# Patient Record
Sex: Male | Born: 1950
Health system: Southern US, Community
[De-identification: ages and names within clinical notes are randomized; demographics above are authoritative.]

## PROBLEM LIST (undated history)

## (undated) ENCOUNTER — Encounter: Attending: Rheumatology | Primary: Rheumatology

## (undated) ENCOUNTER — Telehealth: Attending: Rheumatology | Primary: Rheumatology

## (undated) ENCOUNTER — Ambulatory Visit: Payer: MEDICARE | Attending: Hematology & Oncology | Primary: Hematology & Oncology

## (undated) ENCOUNTER — Ambulatory Visit

## (undated) ENCOUNTER — Telehealth: Attending: Ambulatory Care | Primary: Ambulatory Care

## (undated) ENCOUNTER — Encounter: Attending: Hematology & Oncology | Primary: Hematology & Oncology

## (undated) ENCOUNTER — Ambulatory Visit: Payer: MEDICARE

## (undated) ENCOUNTER — Telehealth: Attending: Hematology & Oncology | Primary: Hematology & Oncology

## (undated) ENCOUNTER — Telehealth

## (undated) ENCOUNTER — Inpatient Hospital Stay

## (undated) ENCOUNTER — Ambulatory Visit: Payer: MEDICARE | Attending: Rheumatology | Primary: Rheumatology

## (undated) ENCOUNTER — Encounter

## (undated) ENCOUNTER — Encounter: Attending: Ambulatory Care | Primary: Ambulatory Care

## (undated) ENCOUNTER — Telehealth: Attending: Internal Medicine | Primary: Internal Medicine

## (undated) ENCOUNTER — Ambulatory Visit: Attending: Pharmacist | Primary: Pharmacist

## (undated) ENCOUNTER — Ambulatory Visit: Attending: Hematology & Oncology | Primary: Hematology & Oncology

## (undated) DIAGNOSIS — T8859XA Other complications of anesthesia, initial encounter: Secondary | ICD-10-CM

## (undated) DIAGNOSIS — J449 Chronic obstructive pulmonary disease, unspecified: Secondary | ICD-10-CM

## (undated) DIAGNOSIS — R918 Other nonspecific abnormal finding of lung field: Secondary | ICD-10-CM

## (undated) DIAGNOSIS — K3184 Gastroparesis: Secondary | ICD-10-CM

## (undated) DIAGNOSIS — G629 Polyneuropathy, unspecified: Secondary | ICD-10-CM

## (undated) DIAGNOSIS — R6521 Severe sepsis with septic shock: Secondary | ICD-10-CM

## (undated) DIAGNOSIS — B351 Tinea unguium: Secondary | ICD-10-CM

## (undated) DIAGNOSIS — H269 Unspecified cataract: Secondary | ICD-10-CM

## (undated) DIAGNOSIS — I2510 Atherosclerotic heart disease of native coronary artery without angina pectoris: Secondary | ICD-10-CM

## (undated) DIAGNOSIS — I10 Essential (primary) hypertension: Secondary | ICD-10-CM

## (undated) DIAGNOSIS — K224 Dyskinesia of esophagus: Secondary | ICD-10-CM

## (undated) DIAGNOSIS — L409 Psoriasis, unspecified: Secondary | ICD-10-CM

## (undated) DIAGNOSIS — M069 Rheumatoid arthritis, unspecified: Secondary | ICD-10-CM

## (undated) DIAGNOSIS — K449 Diaphragmatic hernia without obstruction or gangrene: Secondary | ICD-10-CM

## (undated) DIAGNOSIS — K219 Gastro-esophageal reflux disease without esophagitis: Secondary | ICD-10-CM

## (undated) DIAGNOSIS — Z9981 Dependence on supplemental oxygen: Secondary | ICD-10-CM

## (undated) DIAGNOSIS — T4145XA Adverse effect of unspecified anesthetic, initial encounter: Secondary | ICD-10-CM

## (undated) DIAGNOSIS — K760 Fatty (change of) liver, not elsewhere classified: Secondary | ICD-10-CM

## (undated) DIAGNOSIS — A419 Sepsis, unspecified organism: Secondary | ICD-10-CM

## (undated) HISTORY — DX: Chronic obstructive pulmonary disease, unspecified: J44.9

## (undated) HISTORY — DX: Polyneuropathy, unspecified: G62.9

## (undated) HISTORY — DX: Rheumatoid arthritis, unspecified: M06.9

## (undated) HISTORY — DX: Gastroparesis: K31.84

## (undated) HISTORY — PX: CHOLECYSTECTOMY: SHX55

## (undated) HISTORY — DX: Diaphragmatic hernia without obstruction or gangrene: K44.9

## (undated) HISTORY — DX: Fatty (change of) liver, not elsewhere classified: K76.0

## (undated) HISTORY — PX: FOOT SURGERY: SHX648

## (undated) HISTORY — DX: Dyskinesia of esophagus: K22.4

## (undated) HISTORY — PX: TOE AMPUTATION: SHX809

## (undated) HISTORY — DX: Psoriasis, unspecified: L40.9

## (undated) HISTORY — DX: Unspecified cataract: H26.9

## (undated) HISTORY — DX: Gastro-esophageal reflux disease without esophagitis: K21.9

## (undated) HISTORY — DX: Atherosclerotic heart disease of native coronary artery without angina pectoris: I25.10

## (undated) HISTORY — DX: Essential (primary) hypertension: I10

## (undated) MED ORDER — TURMERIC (BULK) MISC: Freq: Every day | 0.00000 days

## (undated) MED ORDER — RITUXIMAB IV: INTRAVENOUS | 0.00000 days

## (undated) MED ORDER — OXYCODONE-ACETAMINOPHEN 7.5 MG-325 MG TABLET: 1 | tablet | Freq: Four times a day (QID) | 0 refills | 30 days

---

## 1999-03-19 ENCOUNTER — Encounter: Admission: RE | Admit: 1999-03-19 | Discharge: 1999-03-19 | Payer: Self-pay | Admitting: Internal Medicine

## 2004-04-18 ENCOUNTER — Inpatient Hospital Stay (HOSPITAL_COMMUNITY): Admission: EM | Admit: 2004-04-18 | Discharge: 2004-04-25 | Payer: Self-pay | Admitting: Emergency Medicine

## 2004-04-18 ENCOUNTER — Ambulatory Visit: Payer: Self-pay | Admitting: Internal Medicine

## 2004-04-18 ENCOUNTER — Encounter: Payer: Self-pay | Admitting: Cardiology

## 2004-05-07 ENCOUNTER — Ambulatory Visit: Payer: Self-pay | Admitting: Internal Medicine

## 2004-06-15 ENCOUNTER — Ambulatory Visit: Payer: Self-pay | Admitting: Internal Medicine

## 2004-06-19 ENCOUNTER — Ambulatory Visit: Payer: Self-pay | Admitting: Internal Medicine

## 2004-07-24 ENCOUNTER — Ambulatory Visit: Payer: Self-pay | Admitting: Internal Medicine

## 2004-08-30 ENCOUNTER — Ambulatory Visit: Payer: Self-pay | Admitting: Internal Medicine

## 2004-09-12 ENCOUNTER — Ambulatory Visit: Payer: Self-pay | Admitting: Internal Medicine

## 2004-09-15 ENCOUNTER — Ambulatory Visit (HOSPITAL_COMMUNITY): Admission: RE | Admit: 2004-09-15 | Discharge: 2004-09-15 | Payer: Self-pay | Admitting: Internal Medicine

## 2004-09-18 ENCOUNTER — Ambulatory Visit: Payer: Self-pay | Admitting: Internal Medicine

## 2004-10-30 ENCOUNTER — Ambulatory Visit: Payer: Self-pay | Admitting: Internal Medicine

## 2004-11-01 ENCOUNTER — Ambulatory Visit: Payer: Self-pay | Admitting: Internal Medicine

## 2004-12-12 ENCOUNTER — Ambulatory Visit: Payer: Self-pay | Admitting: Internal Medicine

## 2004-12-17 ENCOUNTER — Ambulatory Visit: Payer: Self-pay | Admitting: Internal Medicine

## 2005-01-18 ENCOUNTER — Ambulatory Visit: Payer: Self-pay | Admitting: Internal Medicine

## 2005-02-01 ENCOUNTER — Emergency Department (HOSPITAL_COMMUNITY): Admission: EM | Admit: 2005-02-01 | Discharge: 2005-02-01 | Payer: Self-pay | Admitting: Family Medicine

## 2005-02-15 ENCOUNTER — Ambulatory Visit: Payer: Self-pay | Admitting: Internal Medicine

## 2005-03-06 ENCOUNTER — Ambulatory Visit: Payer: Self-pay | Admitting: Internal Medicine

## 2005-03-21 ENCOUNTER — Ambulatory Visit: Payer: Self-pay | Admitting: Internal Medicine

## 2005-03-27 ENCOUNTER — Ambulatory Visit: Payer: Self-pay | Admitting: Internal Medicine

## 2005-04-22 ENCOUNTER — Ambulatory Visit: Payer: Self-pay | Admitting: Internal Medicine

## 2005-06-26 ENCOUNTER — Ambulatory Visit: Payer: Self-pay | Admitting: Internal Medicine

## 2005-06-28 ENCOUNTER — Ambulatory Visit: Payer: Self-pay | Admitting: Internal Medicine

## 2005-08-26 HISTORY — PX: CORONARY ARTERY BYPASS GRAFT: SHX141

## 2005-09-25 ENCOUNTER — Ambulatory Visit: Payer: Self-pay | Admitting: Internal Medicine

## 2005-12-06 ENCOUNTER — Ambulatory Visit: Payer: Self-pay | Admitting: Internal Medicine

## 2005-12-12 ENCOUNTER — Ambulatory Visit: Payer: Self-pay | Admitting: Internal Medicine

## 2005-12-12 ENCOUNTER — Ambulatory Visit (HOSPITAL_COMMUNITY): Admission: RE | Admit: 2005-12-12 | Discharge: 2005-12-12 | Payer: Self-pay | Admitting: Internal Medicine

## 2006-01-01 ENCOUNTER — Ambulatory Visit (HOSPITAL_COMMUNITY): Admission: RE | Admit: 2006-01-01 | Discharge: 2006-01-01 | Payer: Self-pay | Admitting: Internal Medicine

## 2006-01-01 ENCOUNTER — Ambulatory Visit: Payer: Self-pay | Admitting: Internal Medicine

## 2006-01-07 ENCOUNTER — Ambulatory Visit: Payer: Self-pay | Admitting: Internal Medicine

## 2006-02-11 ENCOUNTER — Ambulatory Visit: Payer: Self-pay | Admitting: Internal Medicine

## 2006-03-26 ENCOUNTER — Ambulatory Visit: Payer: Self-pay | Admitting: Internal Medicine

## 2006-04-19 ENCOUNTER — Ambulatory Visit: Payer: Self-pay | Admitting: Critical Care Medicine

## 2006-04-19 ENCOUNTER — Ambulatory Visit: Payer: Self-pay | Admitting: *Deleted

## 2006-04-19 ENCOUNTER — Inpatient Hospital Stay (HOSPITAL_COMMUNITY): Admission: EM | Admit: 2006-04-19 | Discharge: 2006-05-12 | Payer: Self-pay | Admitting: *Deleted

## 2006-04-21 ENCOUNTER — Encounter: Payer: Self-pay | Admitting: Cardiology

## 2006-04-21 ENCOUNTER — Encounter: Payer: Self-pay | Admitting: Vascular Surgery

## 2006-04-22 ENCOUNTER — Encounter: Payer: Self-pay | Admitting: Vascular Surgery

## 2006-05-19 ENCOUNTER — Ambulatory Visit: Payer: Self-pay | Admitting: Cardiology

## 2006-05-29 ENCOUNTER — Ambulatory Visit: Payer: Self-pay | Admitting: Cardiology

## 2006-06-06 DIAGNOSIS — L408 Other psoriasis: Secondary | ICD-10-CM | POA: Insufficient documentation

## 2006-06-06 DIAGNOSIS — K219 Gastro-esophageal reflux disease without esophagitis: Secondary | ICD-10-CM | POA: Insufficient documentation

## 2006-06-09 ENCOUNTER — Ambulatory Visit: Payer: Self-pay | Admitting: Internal Medicine

## 2006-06-09 LAB — CONVERTED CEMR LAB
ALT: 46 U/L — ABNORMAL HIGH
AST: 29 U/L
Albumin: 3.4 g/dL — ABNORMAL LOW
Alkaline Phosphatase: 78 U/L
Bilirubin, Direct: 0.1 mg/dL
HCT: 40.2 %
Hemoglobin: 13 g/dL
MCHC: 32.4 g/dL
MCV: 83.7 fL
Platelets: 335 K/uL
RBC: 4.8 M/uL
RDW: 19.9 % — ABNORMAL HIGH
Total Bilirubin: 0.5 mg/dL
Total Protein: 6.8 g/dL
WBC: 14.1 10*3/microliter — ABNORMAL HIGH

## 2006-07-21 ENCOUNTER — Ambulatory Visit: Payer: Self-pay | Admitting: Internal Medicine

## 2006-07-21 LAB — CONVERTED CEMR LAB
Hemoglobin: 13.5 g/dL (ref 13.0–17.0)
MCV: 86.9 fL (ref 78.0–100.0)
Platelets: 253 10*3/uL (ref 150–400)
RBC: 4.74 M/uL (ref 4.22–5.81)

## 2006-09-16 DIAGNOSIS — I10 Essential (primary) hypertension: Secondary | ICD-10-CM | POA: Insufficient documentation

## 2006-09-16 DIAGNOSIS — M359 Systemic involvement of connective tissue, unspecified: Secondary | ICD-10-CM | POA: Insufficient documentation

## 2006-09-16 DIAGNOSIS — G63 Polyneuropathy in diseases classified elsewhere: Secondary | ICD-10-CM | POA: Insufficient documentation

## 2006-09-23 ENCOUNTER — Ambulatory Visit: Payer: Self-pay | Admitting: Cardiology

## 2006-09-29 ENCOUNTER — Ambulatory Visit: Payer: Self-pay | Admitting: Internal Medicine

## 2006-09-29 LAB — CONVERTED CEMR LAB
Bilirubin, Direct: 0.1 mg/dL (ref 0.0–0.3)
HDL: 38 mg/dL — ABNORMAL LOW (ref >39.0)
LDL Cholesterol: 81 mg/dL (ref 0–99)
Triglycerides: 107 mg/dL (ref 0–149)
VLDL: 21 mg/dL (ref 0–40)

## 2006-11-25 ENCOUNTER — Encounter: Payer: Self-pay | Admitting: Internal Medicine

## 2006-11-25 ENCOUNTER — Ambulatory Visit: Payer: Self-pay | Admitting: Internal Medicine

## 2006-11-25 DIAGNOSIS — I2510 Atherosclerotic heart disease of native coronary artery without angina pectoris: Secondary | ICD-10-CM | POA: Insufficient documentation

## 2006-11-25 LAB — CONVERTED CEMR LAB
AST: 22 units/L (ref 0–37)
Alkaline Phosphatase: 69 units/L (ref 39–117)
Basophils Relative: 0.6 % (ref 0.0–1.0)
Eosinophils Relative: 1.7 % (ref 0.0–5.0)
Hemoglobin: 14.8 g/dL (ref 13.0–17.0)
Lymphocytes Relative: 6.9 % — ABNORMAL LOW (ref 12.0–46.0)
Monocytes Absolute: 0.7 10*3/uL (ref 0.2–0.7)
Neutro Abs: 10.6 10*3/uL — ABNORMAL HIGH (ref 1.4–7.7)
Platelets: 313 10*3/uL (ref 150–400)
Total Bilirubin: 0.5 mg/dL (ref 0.3–1.2)
Total Protein: 7 g/dL (ref 6.0–8.3)
WBC: 12.5 10*3/uL — ABNORMAL HIGH (ref 4.5–10.5)

## 2006-12-12 ENCOUNTER — Telehealth (INDEPENDENT_AMBULATORY_CARE_PROVIDER_SITE_OTHER): Payer: Self-pay | Admitting: Pharmacy Technician

## 2007-01-02 ENCOUNTER — Telehealth: Payer: Self-pay | Admitting: *Deleted

## 2007-01-09 ENCOUNTER — Encounter: Payer: Self-pay | Admitting: Internal Medicine

## 2007-01-09 ENCOUNTER — Ambulatory Visit: Payer: Self-pay | Admitting: Internal Medicine

## 2007-02-05 ENCOUNTER — Ambulatory Visit: Payer: Self-pay | Admitting: Internal Medicine

## 2007-02-09 LAB — CONVERTED CEMR LAB
ALT: 35 units/L (ref 0–40)
AST: 23 units/L (ref 0–37)
Albumin: 3.7 g/dL (ref 3.5–5.2)
Alkaline Phosphatase: 69 units/L (ref 39–117)
Basophils Absolute: 0.3 10*3/uL — ABNORMAL HIGH (ref 0.0–0.1)
HCT: 45.4 % (ref 39.0–52.0)
MCHC: 33.7 g/dL (ref 30.0–36.0)
Neutrophils Relative %: 85.5 % — ABNORMAL HIGH (ref 43.0–77.0)
Platelets: 274 10*3/uL (ref 150–400)
RBC: 4.74 M/uL (ref 4.22–5.81)
RDW: 15.2 % — ABNORMAL HIGH (ref 11.5–14.6)

## 2007-02-11 ENCOUNTER — Encounter: Payer: Self-pay | Admitting: Internal Medicine

## 2007-03-12 ENCOUNTER — Ambulatory Visit: Payer: Self-pay | Admitting: Cardiology

## 2007-04-13 ENCOUNTER — Ambulatory Visit: Payer: Self-pay | Admitting: Internal Medicine

## 2007-04-13 LAB — CONVERTED CEMR LAB
Albumin: 3.6 g/dL (ref 3.5–5.2)
Alkaline Phosphatase: 65 units/L (ref 39–117)
LDL Cholesterol: 85 mg/dL (ref 0–99)
Total CHOL/HDL Ratio: 3.4
VLDL: 12 mg/dL (ref 0–40)

## 2007-06-04 ENCOUNTER — Encounter: Payer: Self-pay | Admitting: Internal Medicine

## 2007-06-24 ENCOUNTER — Telehealth: Payer: Self-pay | Admitting: Internal Medicine

## 2007-06-24 ENCOUNTER — Ambulatory Visit: Payer: Self-pay | Admitting: Internal Medicine

## 2007-06-29 ENCOUNTER — Ambulatory Visit: Payer: Self-pay | Admitting: Cardiovascular Disease

## 2007-06-29 LAB — CONVERTED CEMR LAB
BUN: 12 mg/dL (ref 6–23)
CO2: 27 meq/L (ref 19–32)
Calcium: 9.4 mg/dL (ref 8.4–10.5)
Chloride: 106 meq/L (ref 96–112)
Glucose, Bld: 110 mg/dL — ABNORMAL HIGH (ref 70–99)

## 2007-07-08 ENCOUNTER — Ambulatory Visit: Payer: Self-pay

## 2007-07-08 ENCOUNTER — Encounter: Payer: Self-pay | Admitting: Cardiology

## 2007-07-14 ENCOUNTER — Telehealth: Payer: Self-pay | Admitting: Internal Medicine

## 2007-07-16 ENCOUNTER — Telehealth: Payer: Self-pay | Admitting: Internal Medicine

## 2007-07-20 ENCOUNTER — Ambulatory Visit: Payer: Self-pay | Admitting: Internal Medicine

## 2007-07-27 ENCOUNTER — Ambulatory Visit: Payer: Self-pay | Admitting: Internal Medicine

## 2007-07-31 ENCOUNTER — Ambulatory Visit: Payer: Self-pay | Admitting: Cardiology

## 2007-08-03 LAB — CONVERTED CEMR LAB
Calcium: 9.6 mg/dL (ref 8.4–10.5)
Chloride: 106 meq/L (ref 96–112)
Creatinine, Ser: 0.8 mg/dL (ref 0.4–1.5)
Glucose, Bld: 91 mg/dL (ref 70–99)
Sodium: 143 meq/L (ref 135–145)

## 2007-08-11 ENCOUNTER — Telehealth (INDEPENDENT_AMBULATORY_CARE_PROVIDER_SITE_OTHER): Payer: Self-pay | Admitting: *Deleted

## 2007-08-25 ENCOUNTER — Ambulatory Visit: Payer: Self-pay | Admitting: Cardiology

## 2007-08-25 ENCOUNTER — Inpatient Hospital Stay (HOSPITAL_COMMUNITY): Admission: EM | Admit: 2007-08-25 | Discharge: 2007-08-28 | Payer: Self-pay | Admitting: Emergency Medicine

## 2007-08-26 ENCOUNTER — Encounter: Payer: Self-pay | Admitting: Cardiology

## 2007-08-26 ENCOUNTER — Ambulatory Visit: Payer: Self-pay | Admitting: Vascular Surgery

## 2007-08-27 ENCOUNTER — Encounter: Payer: Self-pay | Admitting: Internal Medicine

## 2007-09-07 ENCOUNTER — Ambulatory Visit: Payer: Self-pay | Admitting: Pulmonary Disease

## 2007-09-07 ENCOUNTER — Encounter: Payer: Self-pay | Admitting: Internal Medicine

## 2007-09-15 ENCOUNTER — Encounter: Payer: Self-pay | Admitting: Internal Medicine

## 2007-09-24 ENCOUNTER — Ambulatory Visit: Payer: Self-pay | Admitting: Cardiology

## 2007-10-02 ENCOUNTER — Ambulatory Visit: Payer: Self-pay | Admitting: Internal Medicine

## 2007-10-05 ENCOUNTER — Encounter (INDEPENDENT_AMBULATORY_CARE_PROVIDER_SITE_OTHER): Payer: Self-pay | Admitting: *Deleted

## 2007-10-06 ENCOUNTER — Encounter: Payer: Self-pay | Admitting: Internal Medicine

## 2007-10-07 ENCOUNTER — Encounter: Payer: Self-pay | Admitting: Internal Medicine

## 2007-10-12 ENCOUNTER — Telehealth (INDEPENDENT_AMBULATORY_CARE_PROVIDER_SITE_OTHER): Payer: Self-pay | Admitting: *Deleted

## 2007-10-15 ENCOUNTER — Telehealth (INDEPENDENT_AMBULATORY_CARE_PROVIDER_SITE_OTHER): Payer: Self-pay | Admitting: *Deleted

## 2007-10-19 ENCOUNTER — Ambulatory Visit: Payer: Self-pay | Admitting: Internal Medicine

## 2007-12-10 ENCOUNTER — Ambulatory Visit: Payer: Self-pay | Admitting: Cardiology

## 2007-12-14 ENCOUNTER — Telehealth (INDEPENDENT_AMBULATORY_CARE_PROVIDER_SITE_OTHER): Payer: Self-pay | Admitting: *Deleted

## 2007-12-14 ENCOUNTER — Encounter: Payer: Self-pay | Admitting: Internal Medicine

## 2007-12-21 ENCOUNTER — Telehealth (INDEPENDENT_AMBULATORY_CARE_PROVIDER_SITE_OTHER): Payer: Self-pay | Admitting: *Deleted

## 2008-01-26 ENCOUNTER — Ambulatory Visit: Payer: Self-pay | Admitting: Internal Medicine

## 2008-01-27 ENCOUNTER — Encounter: Payer: Self-pay | Admitting: Internal Medicine

## 2008-02-01 ENCOUNTER — Encounter (INDEPENDENT_AMBULATORY_CARE_PROVIDER_SITE_OTHER): Payer: Self-pay | Admitting: *Deleted

## 2008-02-02 ENCOUNTER — Telehealth (INDEPENDENT_AMBULATORY_CARE_PROVIDER_SITE_OTHER): Payer: Self-pay | Admitting: *Deleted

## 2008-02-18 ENCOUNTER — Encounter: Payer: Self-pay | Admitting: Internal Medicine

## 2008-02-19 ENCOUNTER — Ambulatory Visit: Payer: Self-pay | Admitting: Internal Medicine

## 2008-02-24 ENCOUNTER — Telehealth (INDEPENDENT_AMBULATORY_CARE_PROVIDER_SITE_OTHER): Payer: Self-pay | Admitting: *Deleted

## 2008-02-24 ENCOUNTER — Ambulatory Visit: Payer: Self-pay | Admitting: Cardiology

## 2008-03-03 ENCOUNTER — Encounter: Payer: Self-pay | Admitting: Internal Medicine

## 2008-03-04 ENCOUNTER — Telehealth (INDEPENDENT_AMBULATORY_CARE_PROVIDER_SITE_OTHER): Payer: Self-pay | Admitting: *Deleted

## 2008-03-08 ENCOUNTER — Telehealth: Payer: Self-pay | Admitting: Internal Medicine

## 2008-03-16 ENCOUNTER — Ambulatory Visit: Payer: Self-pay | Admitting: Internal Medicine

## 2008-03-17 ENCOUNTER — Encounter: Payer: Self-pay | Admitting: Internal Medicine

## 2008-03-21 ENCOUNTER — Telehealth (INDEPENDENT_AMBULATORY_CARE_PROVIDER_SITE_OTHER): Payer: Self-pay | Admitting: *Deleted

## 2008-03-26 HISTORY — PX: OTHER SURGICAL HISTORY: SHX169

## 2008-03-29 ENCOUNTER — Ambulatory Visit: Payer: Self-pay | Admitting: Cardiology

## 2008-03-29 ENCOUNTER — Inpatient Hospital Stay (HOSPITAL_COMMUNITY): Admission: EM | Admit: 2008-03-29 | Discharge: 2008-04-01 | Payer: Self-pay | Admitting: Emergency Medicine

## 2008-03-29 ENCOUNTER — Ambulatory Visit: Payer: Self-pay | Admitting: Internal Medicine

## 2008-03-29 ENCOUNTER — Telehealth (INDEPENDENT_AMBULATORY_CARE_PROVIDER_SITE_OTHER): Payer: Self-pay | Admitting: *Deleted

## 2008-03-29 ENCOUNTER — Ambulatory Visit: Payer: Self-pay | Admitting: Pulmonary Disease

## 2008-03-30 ENCOUNTER — Ambulatory Visit: Payer: Self-pay | Admitting: Vascular Surgery

## 2008-03-30 ENCOUNTER — Encounter: Payer: Self-pay | Admitting: Internal Medicine

## 2008-03-30 ENCOUNTER — Encounter (INDEPENDENT_AMBULATORY_CARE_PROVIDER_SITE_OTHER): Payer: Self-pay | Admitting: Pulmonary Disease

## 2008-04-12 ENCOUNTER — Ambulatory Visit: Payer: Self-pay | Admitting: Internal Medicine

## 2008-04-12 ENCOUNTER — Telehealth (INDEPENDENT_AMBULATORY_CARE_PROVIDER_SITE_OTHER): Payer: Self-pay | Admitting: *Deleted

## 2008-04-12 LAB — CONVERTED CEMR LAB
Blood in Urine, dipstick: NEGATIVE
Glucose, Urine, Semiquant: NEGATIVE
Ketones, urine, test strip: NEGATIVE
Nitrite: NEGATIVE
Protein, U semiquant: NEGATIVE
WBC Urine, dipstick: NEGATIVE

## 2008-04-13 ENCOUNTER — Telehealth (INDEPENDENT_AMBULATORY_CARE_PROVIDER_SITE_OTHER): Payer: Self-pay | Admitting: *Deleted

## 2008-04-14 ENCOUNTER — Encounter: Payer: Self-pay | Admitting: Internal Medicine

## 2008-04-19 ENCOUNTER — Telehealth: Payer: Self-pay | Admitting: Internal Medicine

## 2008-04-20 ENCOUNTER — Telehealth: Payer: Self-pay | Admitting: Internal Medicine

## 2008-04-28 ENCOUNTER — Encounter: Payer: Self-pay | Admitting: Internal Medicine

## 2008-05-04 ENCOUNTER — Ambulatory Visit: Payer: Self-pay | Admitting: Internal Medicine

## 2008-05-06 ENCOUNTER — Encounter: Payer: Self-pay | Admitting: Internal Medicine

## 2008-05-10 ENCOUNTER — Telehealth: Payer: Self-pay | Admitting: Internal Medicine

## 2008-05-13 ENCOUNTER — Telehealth (INDEPENDENT_AMBULATORY_CARE_PROVIDER_SITE_OTHER): Payer: Self-pay | Admitting: *Deleted

## 2008-05-22 ENCOUNTER — Ambulatory Visit: Payer: Self-pay | Admitting: Internal Medicine

## 2008-05-22 ENCOUNTER — Inpatient Hospital Stay (HOSPITAL_COMMUNITY): Admission: EM | Admit: 2008-05-22 | Discharge: 2008-05-24 | Payer: Self-pay | Admitting: Endocrinology

## 2008-05-26 ENCOUNTER — Telehealth: Payer: Self-pay | Admitting: Internal Medicine

## 2008-06-01 ENCOUNTER — Ambulatory Visit: Payer: Self-pay | Admitting: Internal Medicine

## 2008-06-01 ENCOUNTER — Encounter (INDEPENDENT_AMBULATORY_CARE_PROVIDER_SITE_OTHER): Payer: Self-pay | Admitting: *Deleted

## 2008-06-03 ENCOUNTER — Telehealth: Payer: Self-pay | Admitting: Pulmonary Disease

## 2008-06-07 ENCOUNTER — Encounter: Payer: Self-pay | Admitting: Internal Medicine

## 2008-06-23 ENCOUNTER — Encounter: Payer: Self-pay | Admitting: Internal Medicine

## 2008-06-23 ENCOUNTER — Ambulatory Visit: Payer: Self-pay | Admitting: Family Medicine

## 2008-07-13 ENCOUNTER — Ambulatory Visit: Payer: Self-pay | Admitting: Internal Medicine

## 2008-07-13 DIAGNOSIS — M810 Age-related osteoporosis without current pathological fracture: Secondary | ICD-10-CM | POA: Insufficient documentation

## 2008-07-13 DIAGNOSIS — M79609 Pain in unspecified limb: Secondary | ICD-10-CM | POA: Insufficient documentation

## 2008-07-13 LAB — CONVERTED CEMR LAB
Creatinine, Ser: 0.7 mg/dL (ref 0.4–1.5)
Magnesium: 1.9 mg/dL (ref 1.5–2.5)
Phosphorus: 3.3 mg/dL (ref 2.3–4.6)
Uric Acid, Serum: 5.1 mg/dL (ref 4.0–7.8)

## 2008-07-19 ENCOUNTER — Telehealth (INDEPENDENT_AMBULATORY_CARE_PROVIDER_SITE_OTHER): Payer: Self-pay | Admitting: *Deleted

## 2008-07-19 ENCOUNTER — Telehealth: Payer: Self-pay | Admitting: Internal Medicine

## 2008-07-19 LAB — CONVERTED CEMR LAB: Vit D, 1,25-Dihydroxy: 30 (ref 30–89)

## 2008-07-24 ENCOUNTER — Ambulatory Visit: Payer: Self-pay | Admitting: Internal Medicine

## 2008-07-24 ENCOUNTER — Telehealth: Payer: Self-pay | Admitting: Internal Medicine

## 2008-07-24 ENCOUNTER — Inpatient Hospital Stay (HOSPITAL_COMMUNITY): Admission: AD | Admit: 2008-07-24 | Discharge: 2008-07-28 | Payer: Self-pay | Admitting: Internal Medicine

## 2008-07-24 ENCOUNTER — Encounter: Payer: Self-pay | Admitting: Emergency Medicine

## 2008-07-25 ENCOUNTER — Telehealth: Payer: Self-pay | Admitting: Internal Medicine

## 2008-07-26 ENCOUNTER — Encounter: Payer: Self-pay | Admitting: Internal Medicine

## 2008-07-26 ENCOUNTER — Ambulatory Visit: Payer: Self-pay | Admitting: Surgery

## 2008-08-01 ENCOUNTER — Ambulatory Visit: Payer: Self-pay | Admitting: Family Medicine

## 2008-08-01 DIAGNOSIS — E1140 Type 2 diabetes mellitus with diabetic neuropathy, unspecified: Secondary | ICD-10-CM | POA: Insufficient documentation

## 2008-08-08 ENCOUNTER — Ambulatory Visit: Payer: Self-pay | Admitting: Internal Medicine

## 2008-08-08 LAB — CONVERTED CEMR LAB
ALT: 26 units/L (ref 0–53)
AST: 23 units/L (ref 0–37)
Alkaline Phosphatase: 113 units/L (ref 39–117)
Basophils Absolute: 0 10*3/uL (ref 0.0–0.1)
Bilirubin, Direct: 0.1 mg/dL (ref 0.0–0.3)
CO2: 28 meq/L (ref 19–32)
Chloride: 104 meq/L (ref 96–112)
Glucose, Bld: 99 mg/dL (ref 70–99)
Hemoglobin: 13.4 g/dL (ref 13.0–17.0)
Lymphocytes Relative: 6.4 % — ABNORMAL LOW (ref 12.0–46.0)
MCHC: 34.3 g/dL (ref 30.0–36.0)
Magnesium: 2 mg/dL (ref 1.5–2.5)
Monocytes Relative: 4.7 % (ref 3.0–12.0)
Neutro Abs: 9.4 10*3/uL — ABNORMAL HIGH (ref 1.4–7.7)
Neutrophils Relative %: 87.8 % — ABNORMAL HIGH (ref 43.0–77.0)
Potassium: 4.1 meq/L (ref 3.5–5.1)
RDW: 15.6 % — ABNORMAL HIGH (ref 11.5–14.6)
Sodium: 141 meq/L (ref 135–145)
Total Bilirubin: 0.6 mg/dL (ref 0.3–1.2)
Total Protein: 6.8 g/dL (ref 6.0–8.3)

## 2008-08-11 ENCOUNTER — Ambulatory Visit: Payer: Self-pay | Admitting: Internal Medicine

## 2008-08-11 ENCOUNTER — Telehealth (INDEPENDENT_AMBULATORY_CARE_PROVIDER_SITE_OTHER): Payer: Self-pay | Admitting: *Deleted

## 2008-08-24 ENCOUNTER — Encounter: Payer: Self-pay | Admitting: Internal Medicine

## 2008-08-29 ENCOUNTER — Telehealth: Payer: Self-pay | Admitting: Internal Medicine

## 2008-09-01 ENCOUNTER — Ambulatory Visit: Payer: Self-pay | Admitting: Cardiology

## 2008-09-07 ENCOUNTER — Telehealth: Payer: Self-pay | Admitting: Internal Medicine

## 2008-09-12 ENCOUNTER — Ambulatory Visit: Payer: Self-pay | Admitting: Internal Medicine

## 2008-09-12 DIAGNOSIS — Q782 Osteopetrosis: Secondary | ICD-10-CM | POA: Insufficient documentation

## 2008-09-13 ENCOUNTER — Encounter: Payer: Self-pay | Admitting: Internal Medicine

## 2008-09-15 ENCOUNTER — Telehealth (INDEPENDENT_AMBULATORY_CARE_PROVIDER_SITE_OTHER): Payer: Self-pay | Admitting: *Deleted

## 2008-09-21 ENCOUNTER — Encounter: Payer: Self-pay | Admitting: Internal Medicine

## 2008-10-03 ENCOUNTER — Telehealth: Payer: Self-pay | Admitting: Internal Medicine

## 2008-10-10 ENCOUNTER — Encounter: Payer: Self-pay | Admitting: Internal Medicine

## 2008-10-12 ENCOUNTER — Encounter: Payer: Self-pay | Admitting: Internal Medicine

## 2008-11-01 ENCOUNTER — Encounter: Payer: Self-pay | Admitting: Internal Medicine

## 2008-11-23 ENCOUNTER — Encounter: Payer: Self-pay | Admitting: Internal Medicine

## 2008-11-23 ENCOUNTER — Telehealth (INDEPENDENT_AMBULATORY_CARE_PROVIDER_SITE_OTHER): Payer: Self-pay | Admitting: *Deleted

## 2008-11-25 ENCOUNTER — Encounter: Payer: Self-pay | Admitting: Internal Medicine

## 2008-11-25 LAB — CONVERTED CEMR LAB
ALT: 38 units/L
AST: 36 units/L
Platelets: 259 10*3/uL

## 2008-12-02 ENCOUNTER — Encounter: Payer: Self-pay | Admitting: Internal Medicine

## 2008-12-19 ENCOUNTER — Encounter: Payer: Self-pay | Admitting: Internal Medicine

## 2008-12-21 ENCOUNTER — Telehealth: Payer: Self-pay | Admitting: Internal Medicine

## 2008-12-27 ENCOUNTER — Telehealth (INDEPENDENT_AMBULATORY_CARE_PROVIDER_SITE_OTHER): Payer: Self-pay | Admitting: *Deleted

## 2009-01-10 ENCOUNTER — Ambulatory Visit: Payer: Self-pay | Admitting: Internal Medicine

## 2009-01-11 ENCOUNTER — Encounter: Payer: Self-pay | Admitting: Internal Medicine

## 2009-01-12 ENCOUNTER — Ambulatory Visit: Payer: Self-pay | Admitting: Internal Medicine

## 2009-01-17 ENCOUNTER — Telehealth: Payer: Self-pay | Admitting: Internal Medicine

## 2009-02-01 ENCOUNTER — Encounter: Payer: Self-pay | Admitting: Cardiology

## 2009-02-08 ENCOUNTER — Telehealth: Payer: Self-pay | Admitting: Cardiology

## 2009-02-13 ENCOUNTER — Telehealth: Payer: Self-pay | Admitting: Cardiology

## 2009-02-14 ENCOUNTER — Ambulatory Visit: Payer: Self-pay | Admitting: Cardiology

## 2009-02-14 DIAGNOSIS — R0602 Shortness of breath: Secondary | ICD-10-CM | POA: Insufficient documentation

## 2009-02-15 ENCOUNTER — Telehealth (INDEPENDENT_AMBULATORY_CARE_PROVIDER_SITE_OTHER): Payer: Self-pay | Admitting: *Deleted

## 2009-02-16 ENCOUNTER — Ambulatory Visit: Payer: Self-pay | Admitting: Internal Medicine

## 2009-02-26 LAB — CONVERTED CEMR LAB
ALT: 31 units/L (ref 0–53)
AST: 24 units/L (ref 0–37)
Albumin: 3.5 g/dL (ref 3.5–5.2)
Alkaline Phosphatase: 87 units/L (ref 39–117)
HDL: 35 mg/dL — ABNORMAL LOW (ref >39.00)
Total CHOL/HDL Ratio: 3
Triglycerides: 76 mg/dL (ref 0.0–149.0)

## 2009-03-13 ENCOUNTER — Ambulatory Visit: Payer: Self-pay | Admitting: Internal Medicine

## 2009-03-14 ENCOUNTER — Telehealth (INDEPENDENT_AMBULATORY_CARE_PROVIDER_SITE_OTHER): Payer: Self-pay | Admitting: *Deleted

## 2009-03-17 ENCOUNTER — Telehealth (INDEPENDENT_AMBULATORY_CARE_PROVIDER_SITE_OTHER): Payer: Self-pay | Admitting: *Deleted

## 2009-03-20 ENCOUNTER — Encounter: Payer: Self-pay | Admitting: Internal Medicine

## 2009-03-29 ENCOUNTER — Telehealth (INDEPENDENT_AMBULATORY_CARE_PROVIDER_SITE_OTHER): Payer: Self-pay | Admitting: *Deleted

## 2009-03-30 ENCOUNTER — Ambulatory Visit: Payer: Self-pay | Admitting: Internal Medicine

## 2009-05-23 ENCOUNTER — Ambulatory Visit: Payer: Self-pay | Admitting: Internal Medicine

## 2009-05-26 ENCOUNTER — Telehealth: Payer: Self-pay | Admitting: Internal Medicine

## 2009-06-01 ENCOUNTER — Telehealth: Payer: Self-pay | Admitting: Internal Medicine

## 2009-06-02 ENCOUNTER — Telehealth: Payer: Self-pay | Admitting: Internal Medicine

## 2009-06-08 ENCOUNTER — Telehealth (INDEPENDENT_AMBULATORY_CARE_PROVIDER_SITE_OTHER): Payer: Self-pay | Admitting: *Deleted

## 2009-06-21 ENCOUNTER — Ambulatory Visit: Payer: Self-pay | Admitting: Internal Medicine

## 2009-06-22 ENCOUNTER — Encounter (INDEPENDENT_AMBULATORY_CARE_PROVIDER_SITE_OTHER): Payer: Self-pay | Admitting: *Deleted

## 2009-06-23 LAB — CONVERTED CEMR LAB
Basophils Absolute: 0 10*3/uL (ref 0.0–0.1)
CO2: 30 meq/L (ref 19–32)
Calcium: 8.8 mg/dL (ref 8.4–10.5)
Creatinine, Ser: 0.7 mg/dL (ref 0.4–1.5)
Eosinophils Absolute: 0.4 10*3/uL (ref 0.0–0.7)
GFR calc non Af Amer: 123.37 mL/min (ref >60)
Glucose, Bld: 87 mg/dL (ref 70–99)
Hgb A1c MFr Bld: 6.1 % (ref 4.6–6.5)
Lymphocytes Relative: 17.3 % (ref 12.0–46.0)
MCHC: 33 g/dL (ref 30.0–36.0)
Monocytes Relative: 9.7 % (ref 3.0–12.0)
Neutrophils Relative %: 68.2 % (ref 43.0–77.0)
RBC: 4.05 M/uL — ABNORMAL LOW (ref 4.22–5.81)
RDW: 15.1 % — ABNORMAL HIGH (ref 11.5–14.6)

## 2009-08-04 ENCOUNTER — Telehealth (INDEPENDENT_AMBULATORY_CARE_PROVIDER_SITE_OTHER): Payer: Self-pay | Admitting: *Deleted

## 2009-08-08 ENCOUNTER — Emergency Department (HOSPITAL_BASED_OUTPATIENT_CLINIC_OR_DEPARTMENT_OTHER): Admission: EM | Admit: 2009-08-08 | Discharge: 2009-08-08 | Payer: Self-pay | Admitting: Emergency Medicine

## 2009-08-08 ENCOUNTER — Ambulatory Visit: Payer: Self-pay | Admitting: Interventional Radiology

## 2009-08-08 ENCOUNTER — Telehealth: Payer: Self-pay | Admitting: Internal Medicine

## 2009-08-09 ENCOUNTER — Ambulatory Visit: Payer: Self-pay | Admitting: Internal Medicine

## 2009-08-17 ENCOUNTER — Telehealth (INDEPENDENT_AMBULATORY_CARE_PROVIDER_SITE_OTHER): Payer: Self-pay | Admitting: *Deleted

## 2009-09-07 ENCOUNTER — Encounter: Payer: Self-pay | Admitting: Internal Medicine

## 2009-09-11 ENCOUNTER — Ambulatory Visit: Payer: Self-pay | Admitting: Internal Medicine

## 2009-09-12 ENCOUNTER — Ambulatory Visit: Payer: Self-pay | Admitting: Cardiology

## 2009-09-12 DIAGNOSIS — E785 Hyperlipidemia, unspecified: Secondary | ICD-10-CM | POA: Insufficient documentation

## 2009-10-11 ENCOUNTER — Ambulatory Visit: Payer: Self-pay | Admitting: Cardiovascular Disease

## 2009-10-11 ENCOUNTER — Inpatient Hospital Stay (HOSPITAL_COMMUNITY): Admission: EM | Admit: 2009-10-11 | Discharge: 2009-10-18 | Payer: Self-pay | Admitting: Emergency Medicine

## 2009-10-11 ENCOUNTER — Encounter: Payer: Self-pay | Admitting: Emergency Medicine

## 2009-10-11 ENCOUNTER — Ambulatory Visit: Payer: Self-pay | Admitting: Interventional Radiology

## 2009-10-11 DIAGNOSIS — K760 Fatty (change of) liver, not elsewhere classified: Secondary | ICD-10-CM

## 2009-10-11 HISTORY — DX: Fatty (change of) liver, not elsewhere classified: K76.0

## 2009-10-12 ENCOUNTER — Encounter: Payer: Self-pay | Admitting: Internal Medicine

## 2009-10-13 ENCOUNTER — Encounter: Payer: Self-pay | Admitting: Internal Medicine

## 2009-10-13 ENCOUNTER — Encounter (INDEPENDENT_AMBULATORY_CARE_PROVIDER_SITE_OTHER): Payer: Self-pay | Admitting: Internal Medicine

## 2009-10-25 ENCOUNTER — Ambulatory Visit: Payer: Self-pay | Admitting: Internal Medicine

## 2009-10-27 LAB — CONVERTED CEMR LAB: Sodium: 139 meq/L (ref 135–145)

## 2009-10-30 ENCOUNTER — Encounter: Payer: Self-pay | Admitting: Internal Medicine

## 2009-11-06 ENCOUNTER — Telehealth: Payer: Self-pay | Admitting: Internal Medicine

## 2009-11-10 ENCOUNTER — Ambulatory Visit: Payer: Self-pay | Admitting: Internal Medicine

## 2009-11-21 ENCOUNTER — Ambulatory Visit: Payer: Self-pay | Admitting: Internal Medicine

## 2009-12-02 ENCOUNTER — Emergency Department (HOSPITAL_BASED_OUTPATIENT_CLINIC_OR_DEPARTMENT_OTHER): Admission: EM | Admit: 2009-12-02 | Discharge: 2009-12-02 | Payer: Self-pay | Admitting: Emergency Medicine

## 2009-12-29 ENCOUNTER — Telehealth: Payer: Self-pay | Admitting: Internal Medicine

## 2010-01-04 ENCOUNTER — Encounter: Payer: Self-pay | Admitting: Internal Medicine

## 2010-01-16 ENCOUNTER — Encounter: Payer: Self-pay | Admitting: Internal Medicine

## 2010-01-17 ENCOUNTER — Ambulatory Visit: Payer: Self-pay | Admitting: Internal Medicine

## 2010-01-23 LAB — CONVERTED CEMR LAB
ALT: 32 units/L (ref 0–53)
Basophils Relative: 0.1 % (ref 0.0–3.0)
Eosinophils Absolute: 0.3 10*3/uL (ref 0.0–0.7)
HCT: 40.6 % (ref 39.0–52.0)
Hemoglobin: 13.7 g/dL (ref 13.0–17.0)
Lymphs Abs: 1.4 10*3/uL (ref 0.7–4.0)
MCHC: 33.7 g/dL (ref 30.0–36.0)
MCV: 91.2 fL (ref 78.0–100.0)
Monocytes Absolute: 0.2 10*3/uL (ref 0.1–1.0)
Neutro Abs: 4.1 10*3/uL (ref 1.4–7.7)
RBC: 4.46 M/uL (ref 4.22–5.81)

## 2010-02-14 ENCOUNTER — Telehealth (INDEPENDENT_AMBULATORY_CARE_PROVIDER_SITE_OTHER): Payer: Self-pay | Admitting: *Deleted

## 2010-03-01 ENCOUNTER — Encounter: Payer: Self-pay | Admitting: Internal Medicine

## 2010-03-09 ENCOUNTER — Telehealth: Payer: Self-pay | Admitting: Internal Medicine

## 2010-03-12 ENCOUNTER — Ambulatory Visit: Payer: Self-pay | Admitting: Internal Medicine

## 2010-03-23 ENCOUNTER — Telehealth (INDEPENDENT_AMBULATORY_CARE_PROVIDER_SITE_OTHER): Payer: Self-pay | Admitting: *Deleted

## 2010-03-26 ENCOUNTER — Ambulatory Visit: Payer: Self-pay | Admitting: Internal Medicine

## 2010-04-04 LAB — CONVERTED CEMR LAB
AST: 30 units/L (ref 0–37)
Albumin: 3.5 g/dL (ref 3.5–5.2)
HDL: 38.9 mg/dL — ABNORMAL LOW (ref >39.00)
Total CHOL/HDL Ratio: 3
Triglycerides: 83 mg/dL (ref 0.0–149.0)
VLDL: 16.6 mg/dL (ref 0.0–40.0)

## 2010-04-06 ENCOUNTER — Encounter: Payer: Self-pay | Admitting: Cardiology

## 2010-04-06 ENCOUNTER — Ambulatory Visit: Payer: Self-pay | Admitting: Internal Medicine

## 2010-06-25 ENCOUNTER — Telehealth (INDEPENDENT_AMBULATORY_CARE_PROVIDER_SITE_OTHER): Payer: Self-pay | Admitting: *Deleted

## 2010-07-26 ENCOUNTER — Telehealth (INDEPENDENT_AMBULATORY_CARE_PROVIDER_SITE_OTHER): Payer: Self-pay | Admitting: *Deleted

## 2010-07-30 ENCOUNTER — Encounter: Payer: Self-pay | Admitting: Internal Medicine

## 2010-08-02 ENCOUNTER — Telehealth: Payer: Self-pay | Admitting: Internal Medicine

## 2010-08-03 ENCOUNTER — Encounter: Payer: Self-pay | Admitting: Internal Medicine

## 2010-08-08 ENCOUNTER — Encounter: Payer: Self-pay | Admitting: Internal Medicine

## 2010-08-16 ENCOUNTER — Ambulatory Visit: Payer: Self-pay | Admitting: Internal Medicine

## 2010-08-17 ENCOUNTER — Ambulatory Visit: Payer: Self-pay | Admitting: Internal Medicine

## 2010-08-21 LAB — CONVERTED CEMR LAB
AST: 26 units/L (ref 0–37)
Albumin: 3.5 g/dL (ref 3.5–5.2)
Creatinine, Ser: 0.7 mg/dL (ref 0.4–1.5)
Eosinophils Relative: 3.6 % (ref 0.0–5.0)
HCT: 40.1 % (ref 39.0–52.0)
Lymphs Abs: 1.3 10*3/uL (ref 0.7–4.0)
Monocytes Relative: 8.6 % (ref 3.0–12.0)
Neutrophils Relative %: 71.8 % (ref 43.0–77.0)
Platelets: 204 10*3/uL (ref 150.0–400.0)
WBC: 8.1 10*3/uL (ref 4.5–10.5)

## 2010-09-16 ENCOUNTER — Encounter: Payer: Self-pay | Admitting: Cardiology

## 2010-09-17 ENCOUNTER — Encounter: Payer: Self-pay | Admitting: Emergency Medicine

## 2010-09-19 ENCOUNTER — Telehealth: Payer: Self-pay | Admitting: Internal Medicine

## 2010-09-19 ENCOUNTER — Encounter: Payer: Self-pay | Admitting: Internal Medicine

## 2010-09-25 ENCOUNTER — Ambulatory Visit: Admit: 2010-09-25 | Payer: Self-pay | Admitting: Internal Medicine

## 2010-09-25 NOTE — Progress Notes (Signed)
Summary: continue med  Phone Note Refill Request   Refills Requested: Medication #1:  ALENDRONATE SODIUM 70 MG TABS 1 by mouth qwk pt states that he was given sample of this med and wants to know if he need to continue. if med to be continue pt need rx sent to [facility].pls advise...........Marland KitchenFelecia Deloach CMA  February 14, 2010 4:54 PM    Follow-up for Phone Call        rheumatology Rx this medicine. If rheumatology likes him to continue with it, is ok to call in 1 year supply Jose E. Paz MD  February 15, 2010 8:27 AM   pt aware will contct rheumatology to see if he to continue med and let us know if he need refill.Felecia Deloach CMA  February 15, 2010 9:07 AM

## 2010-09-25 NOTE — Progress Notes (Signed)
----   Converted from flag ---- ---- 03/23/2010 9:47 AM, Okey Regal Spring wrote: lab appt scheduled [id number]   ---- 03/23/2010 9:28 AM, Okey Regal Spring wrote:   ---- 03/22/2010 11:57 AM, Charolotte Capuchin, RN wrote: Order has been put in [facility] for a fasting lipid and liver.  ---- 03/21/2010 4:45 PM, Omer Jack wrote: pt needs to have lab work done prior to office visit can you please send me an order or just forward the order to Okey Regal Spring at the [facility] office so patient can have it done there it is closer for him. He needs lipid / liver / 272.2 / v58.69 and he wants to do it Aug 1st. Thank you very much :) ------------------------------

## 2010-09-25 NOTE — Progress Notes (Signed)
Summary: appt  Phone Note Outgoing Call   Call placed by: Army Fossa CMA,  July 26, 2010 1:14 PM Summary of Call: Pt due for OV w/ Dr.Paz.  Follow-up for Phone Call        Spoke with patient and he is aware he needs an appt. He has two appts next week with specialists and will follow-up with Dr. Drue Novel after that.  Follow-up by: Harold Barban,  July 27, 2010 9:39 AM

## 2010-09-25 NOTE — Progress Notes (Signed)
Summary: FYI only  Phone Note Call from Patient Call back at Home Phone [phone]   Caller: Patient Call For: young Summary of Call: pt was scheduled to f/u w/ dr young mon 7/18. he has had foot surgery recently (wound care/ bone removal) and also has a pic line w/ abx. he will rsc when he gets the ok from his ortho doc. this is just an FYI for dr young per pt. no call back needed.  Initial call taken by: Tivis Ringer, CNA,  March 09, 2010 2:21 PM  Follow-up for Phone Call        noted.Reynaldo Minium CMA  March 09, 2010 5:04 PM    Sent to [facility] for him to note and sign off on .Reynaldo Minium CMA  March 09, 2010 5:04 PM   Additional Follow-up for Phone Call Additional follow up Details #1::        noted Additional Follow-up by: Waymon Budge MD,  March 09, 2010 5:27 PM

## 2010-09-25 NOTE — Letter (Signed)
Summary: [facility] Main Office  [address]   [place], [place] [address]   Phone: [phone]  Fax: [phone]         April 06, 2010 MRN: [id number]   Joseph [name] [address] [place], [place]  [address]   Dear Mr. Champeau,  We have reviewed your cholesterol results.  They are as follows:     Total Cholesterol:    123 (Desirable: less than 200)       HDL  Cholesterol:     38.90  (Desirable: greater than 40 for men and 50 for women)       LDL Cholesterol:       68  (Desirable: less than 100 for low risk and less than 70 for moderate to high risk)       Triglycerides:       83.0  (Desirable: less than 150)  Our recommendations include:  No changes  Call our office at the number listed above if you have any questions.  Lowering your LDL cholesterol is important, but it is only one of a large number of "risk factors" that may indicate that you are at risk for heart disease, stroke or other complications of hardening of the arteries.  Other risk factors include:   A.  Cigarette Smoking* B.  High Blood Pressure* C.  Obesity* D.   Low HDL Cholesterol (see yours above)* E.   Diabetes Mellitus (higher risk if your is uncontrolled) F.  Family history of premature heart disease G.  Previous history of stroke or cardiovascular disease    *These are risk factors YOU HAVE CONTROL OVER.  For more information, visit .      There is now evidence that lowering the TOTAL CHOLESTEROL AND LDL CHOLESTEROL can reduce the risk of heart disease.  The American Heart Association recommends the following guidelines for the treatment of elevated cholesterol:  1.  If there is now current heart disease and less than two risk factors, TOTAL CHOLESTEROL should be less than 200 and LDL CHOLESTEROL should be less than 100. 2.  If there is current heart disease or two or more risk factors, TOTAL CHOLESTEROL should be less than 200 and LDL CHOLESTEROL  should be less than 70.  A diet low in cholesterol, saturated fat, and calories is the cornerstone of treatment for elevated cholesterol.  Cessation of smoking and exercise are also important in the management of elevated cholesterol and preventing vascular disease.  Studies have shown that 30 to 60 minutes of physical activity most days can help lower blood pressure, lower cholesterol, and keep your weight at a healthy level.  Drug therapy is used when cholesterol levels do not respond to therapeutic lifestyle changes (smoking cessation, diet, and exercise) and remains unacceptably high.  If medication is started, it is important to have you levels checked periodically to evaluate the need for further treatment options.     Thank you,    [employer] Team

## 2010-09-25 NOTE — Letter (Signed)
Summary: [facility]  [facility]   Imported By: Lanelle Bal 09/20/2009 13:42:59  _____________________________________________________________________  External Attachment:    Type:   Image     Comment:   External Document

## 2010-09-25 NOTE — Assessment & Plan Note (Signed)
Summary: 1week followup/alr  coming st 3:15   Vital Signs:  Patient profile:   60 year old male Height:      74 inches Weight:      238.4 pounds BMI:     30.72 O2 Sat:      94 % Pulse rate:   87 / minute BP sitting:   120 / 70  Vitals Entered By: Shary Decamp (October 25, 2009 3:34 PM) CC: hosp f/u   History of Present Illness: status-post cholecystectomy 10/14/2009 Hospital records are reviewed, he did well and remained stable throughout the hospital admission last hemoglobin was 11.7, last potassium 3.1, creatinine 0.6 chest x-ray was unremarkable BCX  negative  Current Medications (verified): 1)  Methotrexate (Anti-Rheumatic) 2.5 Mg Tabs (Methotrexate (Anti-Rheumatic)) .... 2.5 Mg 6 Tabs Q Week 2)  Folic Acid 400 Mcg Tabs (Folic Acid) .Marland Kitchen.. Once Daily 3)  Prednisone .... 7.5mg  Once Daily 4)  Lyrica 150 Mg Caps (Pregabalin) .Marland KitchenMarland KitchenMarland Kitchen 150 Mg Three Times A Day 5)  Prilosec Otc 20 Mg Tbec (Omeprazole Magnesium) .Marland Kitchen.. 1 By Mouth Q Am 6)  Multivitamins Tabs (Multiple Vitamin) .... Once Daily 7)  Vitamin C 100 Mg Tabs (Ascorbic Acid) .... Once Daily 8)  Metoprolol Tartrate 25 Mg Tabs (Metoprolol Tartrate) .... Take 1 Tablet By Mouth Twice A Day 9)  Furosemide 20 Mg Tabs (Furosemide) .... Take 2  Tablet By Mouth Every Morning and 1 At 4pm 10)  Amitriptyline Hcl 75 Mg Tabs (Amitriptyline Hcl) .... 2 By Mouth At Bedtime 11)  Alprazolam 0.5 Mg Tabs (Alprazolam) .... As Needed For Sleep 12)  Klor-Con M20 20 Meq Tbcr (Potassium Chloride Crys Cr) .... Tid 13)  Methadone Hcl 10 Mg Tabs (Methadone Hcl) .... Take 1 Three Times A Day 14)  Lipitor 80 Mg  Tabs (Atorvastatin Calcium) .Marland Kitchen.. 1 By Mouth Qhs 15)  B-12 500 Mcg Tabs (Cyanocobalamin) .... Take 1 Tablet By Mouth Once A Day 16)  Perfect Iron 50 Mg Tabs (Iron) .... Take 1 Tablet By Mouth Once A Day 17)  Aspirin 81 Mg  Tbec (Aspirin) .... Take 1 Tablet By Mouth Once A Day 18)  Spiriva Handihaler 18 Mcg  Caps (Tiotropium Bromide  Monohydrate) .Marland Kitchen.. 1 Puff Once Daily 19)  Advair Diskus 100-50 Mcg/dose  Misc (Fluticasone-Salmeterol) .Marland Kitchen.. 1 Puff and Rinse, Twice Daily 20)  Calamine Lotion 21)  One Touch Ultra Mini Strips and Lancets .... Bs Two Times A Day 22)  Endocet 7.5-325 Mg Tabs (Oxycodone-Acetaminophen) .... Take 1 Three Times A Day 23)  Alendronate Sodium 70 Mg Tabs (Alendronate Sodium) .Marland Kitchen.. 1 By Mouth Qwk 24)  Proair Hfa 108 (90 Base) Mcg/act Aers (Albuterol Sulfate) .... 2 Puffs, Four Times A Day As Needed  "rescue" 25)  Nitrostat 0.4 Mg Subl (Nitroglycerin) .Marland Kitchen.. 1 By Mouth As Needed For Chest Pain  Allergies (verified): 1)  Morphine Sulfate (Morphine Sulfate)  Past History:  Past Medical History: Reviewed history from 02/14/2009 and no changes required. AODM Dx 07-2008 A1C 6.2 RHEUMATOID ARTHRITIS  PERIPHERAL POLY NEUROPATHY PSORIASIS   HYPERTENSION   GASTROESOPHAGEAL REFLUX DISEASE  GASTRITIS   COPD CAD: MI 2007, CABG Osteoporosis by DEXA 10-09  Past Surgical History: CABG 2007 ((LIMA to the LAD, left radial to obtuse marginal, SVG to first diagonal, SVG to PDA.  His ejection fraction is 50-55%),  L achiles tendon surgery 8-09 Cholecystectomy 2-11  Social History: Reviewed history from 06/21/2009 and no changes required. Married No children  Former smoker.  Quit in 2007.  Smoked for 40 years at 1 ppd ETOH--no disabled-retired   Review of Systems       since he left the hospital he is doing okay he is to steal slower at the right side of the abdomen and right lateral chest pain is decreasing day by day his appetite is improving denies nausea, vomiting, diarrhea denies fever shortness of breath is at baseline no lower extremity edema  Physical Exam  General:  alert and well-developed.  no distress, looks somewhat tired Lungs:  normal respiratory effort, no intercostal retractions, no accessory muscle use, and normal breath sounds.   Heart:  normal rate, regular rhythm, and no  murmur.  Abdomen:  multiple small surgical wounds, no redness or discharge noted abdomen is soft, not tender, good bowel sounds Extremities:  no right lower extremity pain Psych:  good spirits   Impression & Recommendations:  Problem # 1:  CHOLELITHIASIS (ICD-574.20) status post a laparoscopic cholecystectomy, I think his  post operative pain is within the expected intensity the patient and the family wonder how much acetaminophen can be used, this was discussed in detail, they understand that Endocet  has acetaminophen on it.  See instructions  Problem # 2:  HYPERTENSION (ICD-401.9)  his hemoglobin and potassium were slightly low in the hospital, recheck  His updated medication list for this problem includes:    Metoprolol Tartrate 25 Mg Tabs (Metoprolol tartrate) .Marland Kitchen... Take 1 tablet by mouth twice a day    Furosemide 20 Mg Tabs (Furosemide) .Marland Kitchen... Take 2  tablet by mouth every morning and 1 at 4pm  BP today: 120/70 Prior BP: 112/70 (09/12/2009)  Labs Reviewed: K+: 3.9 (06/21/2009) Creat: : 0.7 (06/21/2009)   Chol: 109 (02/16/2009)   HDL: 35.00 (02/16/2009)   LDL: 59 (02/16/2009)   TG: 76.0 (02/16/2009)  Orders: Venipuncture (15176) TLB-Electrolyte Panel (NA/K/CL/CO2) (80051-LYTES) TLB-Hemoglobin (Hgb) (85018-HGB)  Complete Medication List: 1)  Methotrexate (anti-rheumatic) 2.5 Mg Tabs (Methotrexate (anti-rheumatic)) .... 2.5 mg 6 tabs q week 2)  Folic Acid 400 Mcg Tabs (Folic acid) .Marland Kitchen.. once daily 3)  Prednisone  .... 7.5mg  once daily 4)  Lyrica 150 Mg Caps (Pregabalin) .Marland KitchenMarland KitchenMarland Kitchen 150 mg three times a day 5)  Prilosec Otc 20 Mg Tbec (Omeprazole magnesium) .Marland Kitchen.. 1 by mouth q am 6)  Multivitamins Tabs (Multiple vitamin) .... Once daily 7)  Vitamin C 100 Mg Tabs (Ascorbic acid) .... Once daily 8)  Metoprolol Tartrate 25 Mg Tabs (Metoprolol tartrate) .... Take 1 tablet by mouth twice a day 9)  Furosemide 20 Mg Tabs (Furosemide) .... Take 2  tablet by mouth every morning and 1  at 4pm 10)  Amitriptyline Hcl 75 Mg Tabs (Amitriptyline hcl) .... 2 by mouth at bedtime 11)  Alprazolam 0.5 Mg Tabs (Alprazolam) .... As needed for sleep 12)  Klor-con M20 20 Meq Tbcr (Potassium chloride crys cr) .... Tid 13)  Methadone Hcl 10 Mg Tabs (Methadone hcl) .... Take 1 three times a day 14)  Lipitor 80 Mg Tabs (Atorvastatin calcium) .Marland Kitchen.. 1 by mouth qhs 15)  B-12 500 Mcg Tabs (Cyanocobalamin) .... Take 1 tablet by mouth once a day 16)  Perfect Iron 50 Mg Tabs (Iron) .... Take 1 tablet by mouth once a day 17)  Aspirin 81 Mg Tbec (Aspirin) .... Take 1 tablet by mouth once a day 18)  Spiriva Handihaler 18 Mcg Caps (Tiotropium bromide monohydrate) .Marland Kitchen.. 1 puff once daily 19)  Advair Diskus 100-50 Mcg/dose Misc (Fluticasone-salmeterol) .Marland Kitchen.. 1 puff and rinse, twice daily 20)  Calamine Lotion  21)  One Touch Ultra Mini Strips and Lancets  .... Bs two times a day 22)  Endocet 7.5-325 Mg Tabs (Oxycodone-acetaminophen) .... Take 1 three times a day 23)  Alendronate Sodium 70 Mg Tabs (Alendronate sodium) .Marland Kitchen.. 1 by mouth qwk 24)  Proair Hfa 108 (90 Base) Mcg/act Aers (Albuterol sulfate) .... 2 puffs, four times a day as needed  "rescue" 25)  Nitrostat 0.4 Mg Subl (Nitroglycerin) .Marland Kitchen.. 1 by mouth as needed for chest pain  Patient Instructions: 1)  Take no more than   4000 mg of tylenol  in a 24 hour period( can cause liver damage in higher doses).  2)  Please schedule a follow-up appointment in 4 months .

## 2010-09-25 NOTE — Assessment & Plan Note (Signed)
Summary: rov/apc   Copy to:  Dr. Drue Novel Primary Provider/Referring Provider:  Dr. Drue Novel  CC:  Follow up visit-COPD.  History of Present Illness: 03/13/09- COPD, RA...............Marland Kitchenwife here He was able [place] for first time in several years recently. Tolerated heat with some care. PFTs- small airway obstruction without response to dilator, Normal TLC, DLCO 45%. He feels breathing has stabilized or improved some. He recognizes he is  much better while on oxygen. He is on prilosec now, but preferred omeprazole.  September 11, 2009- COPD, RA................................................Marland Kitchenwife here He was more dyspneic for a while this summer after he got celluitis in left foot and retained fluid for a while. He uses Mucinex to clear thick mucus, usually scant with some green. Little awareness of sinus congestion or postnasal drip.We introduced idea of Neti pot to see if that might help.  Mouth gets very dry.  He gives out much faster if he comes off his oxygen.  April 06, 2010- COPD, RA........................Marland KitchenWife here When he uses Flutter and incentive Spirometer regularly, he does well at clearing secretions, but if he misses he falls behind on congestion quickly. Has been getting IV antibiotics/ cefipime via PICC after left foot surgery for osteomyelitis. He remains on continuous O2 @ 2L via portable concentrator. We discused timing of vaccinations for flu and pneumovax..     Preventive Screening-Counseling & Management  Alcohol-Tobacco     Smoking Status: quit     Year Started: Age 60     Year Quit: 03-2006  Current Medications (verified): 1)  Methotrexate (Anti-Rheumatic) 2.5 Mg Tabs (Methotrexate (Anti-Rheumatic)) .... 2.5 Mg 6 Tabs Q Week 2)  Folic Acid 400 Mcg Tabs (Folic Acid) .Marland Kitchen.. Once Daily 3)  Prednisone .... 7mg  Once Daily 4)  Lyrica 150 Mg Caps (Pregabalin) .Marland KitchenMarland KitchenMarland Kitchen 150 Mg Three Times A Day 5)  Prilosec Otc 20 Mg Tbec (Omeprazole Magnesium) .Marland Kitchen.. 1 By  Mouth Q Am 6)  Multivitamins Tabs (Multiple Vitamin) .... Once Daily 7)  Vitamin C 100 Mg Tabs (Ascorbic Acid) .... Once Daily 8)  Metoprolol Tartrate 25 Mg Tabs (Metoprolol Tartrate) .... Take 1 Tablet By Mouth Twice A Day 9)  Furosemide 20 Mg Tabs (Furosemide) .... Take 2  Tablet By Mouth Every Morning and 1 At 4pm 10)  Amitriptyline Hcl 75 Mg Tabs (Amitriptyline Hcl) .... 2 By Mouth At Bedtime 11)  Alprazolam 0.5 Mg Tabs (Alprazolam) .... As Needed For Sleep 12)  Klor-Con M20 20 Meq Tbcr (Potassium Chloride Crys Cr) .... 2 By Mouth Qam and 1 By Mouth At 4 Pm 13)  Lipitor 80 Mg  Tabs (Atorvastatin Calcium) .Marland Kitchen.. 1 By Mouth Qhs 14)  B-12 500 Mcg Tabs (Cyanocobalamin) .... Take 1 Tablet By Mouth Once A Day 15)  Perfect Iron 50 Mg Tabs (Iron) .... Take 1 Tablet By Mouth Once A Day 16)  Aspirin 81 Mg  Tbec (Aspirin) .... Take 1 Tablet By Mouth Once A Day 17)  Spiriva Handihaler 18 Mcg  Caps (Tiotropium Bromide Monohydrate) .Marland Kitchen.. 1 Puff Once Daily 18)  Advair Diskus 100-50 Mcg/dose  Misc (Fluticasone-Salmeterol) .Marland Kitchen.. 1 Puff and Rinse, Twice Daily 19)  Calamine Lotion 20)  One Touch Ultra Mini Strips and Lancets .... Bs Two Times A Day 21)  Endocet 7.5-325 Mg Tabs (Oxycodone-Acetaminophen) .... Take 1 Four Times A Day 22)  Alendronate Sodium 70 Mg Tabs (Alendronate Sodium) .Marland Kitchen.. 1 By Mouth Qwk 23)  Proair Hfa 108 (90 Base) Mcg/act Aers (Albuterol Sulfate) .... 2 Puffs, Four Times  A Day As Needed  "rescue" 24)  Nitrostat 0.4 Mg Subl (Nitroglycerin) .Marland Kitchen.. 1 By Mouth As Needed For Chest Pain 25)  Oxygen .... 2 L/m 26)  Morphine Sulfate 30 Mg Tabs (Morphine Sulfate) .... Take 1 By Mouth Three Times A Day  Allergies (verified): 1)  Morphine Sulfate (Morphine Sulfate)  Past History:  Past Medical History: Last updated: 02/14/2009 AODM Dx 07-2008 A1C 6.2 RHEUMATOID ARTHRITIS  PERIPHERAL POLY NEUROPATHY PSORIASIS   HYPERTENSION   GASTROESOPHAGEAL REFLUX DISEASE  GASTRITIS   COPD CAD: MI 2007,  CABG Osteoporosis by DEXA 10-09  Family History: Last updated: 06/21/2009 MI -- F 2 MIs, still living 60 y/o heart disease-- F M DM--no Colon ca-- no prostate ca--no  Social History: Last updated: 06/21/2009 Married No children  Former smoker.  Quit in 2007.  Smoked for 40 years at 1 ppd ETOH--no disabled-retired   Risk Factors: Smoking Status: quit (04/06/2010)  Past Surgical History: CABG 2007 ((LIMA to the LAD, left radial to obtuse marginal, SVG to first diagonal, SVG to PDA.  His ejection fraction is 50-55%),  L achiles tendon surgery 8-09 Cholecystectomy 2-11 Foot surgery for tendon release and repair of osteomyelitis  Review of Systems      See HPI       The patient complains of shortness of breath with activity, productive cough, and joint stiffness or pain.  The patient denies shortness of breath at rest, non-productive cough, coughing up blood, chest pain, irregular heartbeats, acid heartburn, indigestion, loss of appetite, weight change, abdominal pain, difficulty swallowing, sore throat, tooth/dental problems, headaches, nasal congestion/difficulty breathing through nose, sneezing, itching, rash, and fever.    Vital Signs:  Patient profile:   60 year old male Height:      74 inches O2 Sat:      95 % on 2 L/min Pulse rate:   81 / minute BP sitting:   126 / 82  (left arm) Cuff size:   regular  Vitals Entered By: Reynaldo Minium CMA (April 06, 2010 9:07 AM)  O2 Flow:  2 L/min CC: Follow up visit-COPD   Physical Exam  Additional Exam:  General: A/Ox3; pleasant and cooperative, NAD, supplemental oxygen- 95% at rest on 2 L concentrator SKIN: no rash, lesions NODES: no lymphadenopathy HEENT: [place]/AT, EOM- WNL, Conjuctivae- clear, PERRLA, TM-WNL, Nose- clear, Throat- clear and wnl, Mallampati  III, very dry mucosa NECK: Supple w/ fair ROM, JVD- none, normal carotid impulses w/o bruits Thyroid-  CHEST: Clear to P&A, no cough,  HEART: RRR, no m/g/r  heard ABDOMEN: Soft and nl; ZOX:WRUE, nl pulses, no edema , rheumatoid changes- hands; walking boot left lower leg. NEURO: Grossly intact to observation      Impression & Recommendations:  Problem # 1:  COPD (ICD-496)  He is clear today and has the right tools to manage his airways. Pulmonary toilet needs ongoing attention. He may eventually benefit from a trial of Daliresp. He knows to get a flu shot in the Fall. Remains prednisone and oxygen dependent and until foot surgery heals, he is wheelchair bound. His updated medication list for this problem includes:    Metoprolol Tartrate 25 Mg Tabs (Metoprolol tartrate) .Marland Kitchen... Take 1 tablet by mouth twice a day    Furosemide 20 Mg Tabs (Furosemide) .Marland Kitchen... Take 2  tablet by mouth every morning and 1 at 4pm    Aspirin 81 Mg Tbec (Aspirin) .Marland Kitchen... Take 1 tablet by mouth once a day    Nitrostat 0.4 Mg Subl (Nitroglycerin) .Marland KitchenMarland KitchenMarland KitchenMarland Kitchen  1 by mouth as needed for chest pain  Orders: Est. Patient Level IV (45409)  Problem # 2:  RHEUMATOID ARTHRITIS (ICD-714.0) So far we are not seeing significant stigmata of rheumatoid lung disease.  Medications Added to Medication List This Visit: 1)  Prednisone  .... 7mg  once daily 2)  Klor-con M20 20 Meq Tbcr (Potassium chloride crys cr) .... 2 by mouth qam and 1 by mouth at 4 pm 3)  Endocet 7.5-325 Mg Tabs (Oxycodone-acetaminophen) .... Take 1 four times a day 4)  Oxygen  .... 2 l/m 5)  Morphine Sulfate 30 Mg Tabs (Morphine sulfate) .... Take 1 by mouth three times a day  Patient Instructions: 1)  Please schedule a follow-up appointment in 6 months. 2)  We will check with [facility] and at [facility] for last CXR report to add to our file. 3)  Consider trying plain Mucinex for thinning your mucus.

## 2010-09-25 NOTE — Consult Note (Signed)
[facility]: [facility]   Imported By: Lanelle Bal 10/18/2009 11:49:53  _____________________________________________________________________  External Attachment:    Type:   Image     Comment:   External Document

## 2010-09-25 NOTE — Miscellaneous (Signed)
Summary: Discharge Notice/[facility]  Discharge Notice/[facility]   Imported By: Lanelle Bal 11/22/2009 13:47:51  _____________________________________________________________________  External Attachment:    Type:   Image     Comment:   External Document

## 2010-09-25 NOTE — Assessment & Plan Note (Signed)
Summary: 6 MONTH RETURN/[facility]   Copy to:  Dr. Drue Novel Primary Provider/Referring Provider:  Dr. Drue Novel  CC:  follow up visit-Increased Cough qhs green in color. Breathing about the same but gets worse quickly without O2.Marland Kitchen  History of Present Illness: 09/12/08- COPD, RA,       wife here Had achilles tendon surgery in July, shortly after had sepsis/ pneumonia, hospitalized at [facility]. #3 more hosps, September for pneumonia and then for cellulitis.. Trying to wean down to 7.5 mg prednisone, methotrexate. Immunosupressed. Has had both flu vax and pneumovax. Has rheumatiologist at [facility]. Discussed possibility of cortrosyn stimulation. Has osteoporosis hips and spine. Today breathing feels very good about breathing, despite two pneumonias. Uses oxygen continuous with home portable concentrator. Asks about water aerobics off  oxygen. Uses Mucinex with benefit but cough is usually not productive. Sleep position to minimize reflux  03/13/09- COPD, RA...............Marland Kitchenwife here He was able [place] to the beach for first time in several years recently. Tolerated heat with some care. PFTs- small airway obstruction without response to dilator, Normal TLC, DLCO 45%. He feels breathing has stabilized or improved some. He recognizes he is  much better while on oxygen. He is on prilosec now, but preferred omeprazole.  September 11, 2009- COPD, RA................................................Marland Kitchenwife here He was more dyspneic for a while this summer after he got celluitis in left foot and retained fluid for a while. He uses Mucinex to clear thick mucus, usually scant with some green. Little awareness of sinus congestion or postnasal drip.We introduced idea of Neti pot to see if that might help.  Mouth gets very dry.  He gives out much faster if he comes off his oxygen.      Current Medications (verified): 1)  Methotrexate (Anti-Rheumatic) 2.5 Mg Tabs (Methotrexate (Anti-Rheumatic)) .... 2.5 Mg 6 Tabs Q Week 2)  Folic  Acid 400 Mcg Tabs (Folic Acid) .Marland Kitchen.. Once Daily 3)  Prednisone .... 7.5mg  Once Daily 4)  Lyrica 150 Mg Caps (Pregabalin) .Marland KitchenMarland KitchenMarland Kitchen 150 Mg Three Times A Day 5)  Prilosec Otc 20 Mg Tbec (Omeprazole Magnesium) .Marland Kitchen.. 1 By Mouth Q Am 6)  Multivitamins Tabs (Multiple Vitamin) .... Once Daily 7)  Vitamin C 100 Mg Tabs (Ascorbic Acid) .... Once Daily 8)  Metoprolol Tartrate 25 Mg Tabs (Metoprolol Tartrate) .... Take 1 Tablet By Mouth Twice A Day 9)  Furosemide 20 Mg Tabs (Furosemide) .... Take 2  Tablet By Mouth Every Morning and 1 At 4pm 10)  Amitriptyline Hcl 75 Mg Tabs (Amitriptyline Hcl) .... 2 By Mouth At Bedtime 11)  Alprazolam 0.5 Mg Tabs (Alprazolam) .... As Needed For Sleep 12)  Klor-Con M20 20 Meq Tbcr (Potassium Chloride Crys Cr) .... Tid 13)  Methadone Hcl 10 Mg Tabs (Methadone Hcl) .... Take 1 Three Times A Day 14)  Lipitor 80 Mg  Tabs (Atorvastatin Calcium) .Marland Kitchen.. 1 By Mouth Qhs 15)  B-12 500 Mcg Tabs (Cyanocobalamin) .... Take 1 Tablet By Mouth Once A Day 16)  Perfect Iron 50 Mg Tabs (Iron) .... Take 1 Tablet By Mouth Once A Day 17)  Aspirin 81 Mg  Tbec (Aspirin) .... Take 1 Tablet By Mouth Once A Day 18)  Spiriva Handihaler 18 Mcg  Caps (Tiotropium Bromide Monohydrate) .Marland Kitchen.. 1 Puff Once Daily 19)  Advair Diskus 100-50 Mcg/dose  Misc (Fluticasone-Salmeterol) .Marland Kitchen.. 1 Puff and Rinse, Twice Daily 20)  Calamine Lotion 21)  O2 24/7 2 L/m Advanced 22)  One Touch Ultra Mini Strips and Lancets .... Bs Two Times A Day  23)  Endocet 7.5-325 Mg Tabs (Oxycodone-Acetaminophen) .... Take 1 Three Times A Day 24)  Alendronate Sodium 70 Mg Tabs (Alendronate Sodium) .Marland Kitchen.. 1 By Mouth Qwk 25)  Proair Hfa 108 (90 Base) Mcg/act Aers (Albuterol Sulfate) .... 2 Puffs, Four Times A Day As Needed  "rescue" 26)  02 .... Pt Requires Oxygen Therapy 24/7 @ 2 Lpm Due To His Diagnosis of Copd 27)  Pulse Ox .... Dx 786.95; 496 28)  Cefprozil 500 Mg Tabs (Cefprozil) .... One By Mouth Twice A Day For 4 Days  Allergies  (verified): 1)  Morphine Sulfate (Morphine Sulfate)  Past History:  Past Medical History: Last updated: 02/14/2009 AODM Dx 07-2008 A1C 6.2 RHEUMATOID ARTHRITIS  PERIPHERAL POLY NEUROPATHY PSORIASIS   HYPERTENSION   GASTROESOPHAGEAL REFLUX DISEASE  GASTRITIS   COPD CAD: MI 2007, CABG Osteoporosis by DEXA 10-09  Past Surgical History: Last updated: 02/14/2009 CABG 2007 ((LIMA to the LAD, left radial to obtuse marginal, SVG to first diagonal, SVG to PDA.  His ejection fraction is 50-55%),  L achiles tendon surgery 8-09  Family History: Last updated: 06/21/2009 MI -- F 2 MIs, still living 60 y/o heart disease-- F M DM--no Colon ca-- no prostate ca--no  Social History: Last updated: 06/21/2009 Married No children  Former smoker.  Quit in 2007.  Smoked for 40 years at 1 ppd ETOH--no disabled-retired   Risk Factors: Smoking Status: quit (11/25/2006)  Review of Systems      See HPI  The patient denies anorexia, fever, weight loss, weight gain, vision loss, decreased hearing, hoarseness, chest pain, syncope, dyspnea on exertion, peripheral edema, prolonged cough, headaches, hemoptysis, and severe indigestion/heartburn.    Vital Signs:  Patient profile:   60 year old male Height:      74 inches Weight:      249 pounds BMI:     32.09 O2 Sat:      93 % on 2 L/min Pulse rate:   76 / minute BP sitting:   110 / 76  (right arm) Cuff size:   regular  Vitals Entered By: Reynaldo Minium CMA (September 11, 2009 11:57 AM)  O2 Flow:  2 L/min  Physical Exam  Additional Exam:  General: A/Ox3; pleasant and cooperative, NAD, supplemental oxygen- 93% sat at rest. SKIN: no rash, lesions NODES: no lymphadenopathy HEENT: [place]/AT, EOM- WNL, Conjuctivae- clear, PERRLA, TM-WNL, Nose- clear, Throat- clear and wnl, [place]  III, very dry mucosa NECK: Supple w/ fair ROM, JVD- none, normal carotid impulses w/o bruits Thyroid-  CHEST: Clear to P&A, no cough, 91% on room air HEART: RRR,  no m/g/r heard ABDOMEN: Soft and nl; nml bowel sounds; no organomegaly or masses noted ZOX:WRUE, nl pulses, no edema , rheumatoid changes- hands; ortho shoes with brace left ankle. NEURO: Grossly intact to observation      Impression & Recommendations:  Problem # 1:  COPD (ICD-496) Oxygen dependent with some chronic bronchitis. He recently took 2 weeks of antibiotics for his cellulitis and current meds seem adequate. We will add biotine for dry mouth.  Medications Added to Medication List This Visit: 1)  Flutter  .Marland Kitchen.. 3 times, 3 times daily as needed  Other Orders: Est. Patient Level II (45409)  Patient Instructions: 1)  Please schedule a follow-up appointment in 6 months. 2)  Try Flutter, perhaps 3 times per session, 3 times daily, when you think you need extra help to loosen mucus. 3)  Consider the saline nasal rinse- squeeze bottle or Neti pot- available at any  drug store. 4)  Try Biotine otc moisture for dry mouth Prescriptions: FLUTTER 3 times, 3 times daily as needed  #1 x prn   Entered and Authorized by:   Waymon Budge MD   Signed by:   Waymon Budge MD on 09/11/2009   Method used:   Print then Give to Patient   RxID:   [id number]

## 2010-09-25 NOTE — Progress Notes (Signed)
Summary: fyi  Phone Note Call from Patient Call back at Work Phone [phone]   Summary of Call: Patient just wanted to let you know that wound on left foot has not healed.  He is now wearing a compression cast.  He will f/u with wound MD in 2 weeks Shary Decamp  Dec 29, 2009 2:09 PM   Follow-up for Phone Call        thank you for the f/u Ryler Laskowski E. Sitlali Koerner MD  Dec 29, 2009 4:38 PM

## 2010-09-25 NOTE — Letter (Signed)
Summary: Letter Regarding Disease Mgmt Program/[facility]  Letter Regarding Disease Mgmt Program/[facility]   Imported By: Lanelle Bal 01/27/2010 10:53:58  _____________________________________________________________________  External Attachment:    Type:   Image     Comment:   External Document

## 2010-09-25 NOTE — Letter (Signed)
Summary: cont. MTX, keep prednisone low dose----Rheumatology  [facility]   Imported By: Lanelle Bal 01/27/2010 11:03:41  _____________________________________________________________________  External Attachment:    Type:   Image     Comment:   External Document

## 2010-09-25 NOTE — Progress Notes (Signed)
Summary: refill  Phone Note Call from Patient Call back at Work Phone [phone]   Call For: young Reason for Call: Refill Medication, Talk to Nurse Summary of Call: Patient needing refill--spiriva--[facility]-[phone] Patient has been out of med for 2 days Initial call taken by: Lehman Prom,  June 25, 2010 11:26 AM  Follow-up for Phone Call        LM for pt that rx was refilled by Dr Leta Jungling office today.  IF pt has any questions he is to call our office. Abigail Miyamoto RN  June 25, 2010 11:42 AM

## 2010-09-25 NOTE — Assessment & Plan Note (Signed)
Summary: 12 MONS ROV 414.01 272.0  [facility]   Visit Type:  Follow-up Primary Provider:  Dr. Drue Novel  CC:  CAD.  History of Present Illness: The patient presents for 6 month followup. Since I last saw him he did have problems with her lower extremity cellulitis and edema. However, he was treated with antibiotics and he did a great job keeping his feet elevated. His edema resolved. He's had no new dyspnea. He's not having any PND or orthopnea. He's not having any chest pressure, neck or arm discomfort.  Current Medications (verified): 1)  Methotrexate (Anti-Rheumatic) 2.5 Mg Tabs (Methotrexate (Anti-Rheumatic)) .... 2.5 Mg 6 Tabs Q Week 2)  Folic Acid 400 Mcg Tabs (Folic Acid) .Marland Kitchen.. Once Daily 3)  Prednisone .... 7.5mg  Once Daily 4)  Lyrica 150 Mg Caps (Pregabalin) .Marland KitchenMarland KitchenMarland Kitchen 150 Mg Three Times A Day 5)  Prilosec Otc 20 Mg Tbec (Omeprazole Magnesium) .Marland Kitchen.. 1 By Mouth Q Am 6)  Multivitamins Tabs (Multiple Vitamin) .... Once Daily 7)  Vitamin C 100 Mg Tabs (Ascorbic Acid) .... Once Daily 8)  Metoprolol Tartrate 25 Mg Tabs (Metoprolol Tartrate) .... Take 1 Tablet By Mouth Twice A Day 9)  Furosemide 20 Mg Tabs (Furosemide) .... Take 2  Tablet By Mouth Every Morning and 1 At 4pm 10)  Amitriptyline Hcl 75 Mg Tabs (Amitriptyline Hcl) .... 2 By Mouth At Bedtime 11)  Alprazolam 0.5 Mg Tabs (Alprazolam) .... As Needed For Sleep 12)  Klor-Con M20 20 Meq Tbcr (Potassium Chloride Crys Cr) .... Tid 13)  Methadone Hcl 10 Mg Tabs (Methadone Hcl) .... Take 1 Three Times A Day 14)  Lipitor 80 Mg  Tabs (Atorvastatin Calcium) .Marland Kitchen.. 1 By Mouth Qhs 15)  B-12 500 Mcg Tabs (Cyanocobalamin) .... Take 1 Tablet By Mouth Once A Day 16)  Perfect Iron 50 Mg Tabs (Iron) .... Take 1 Tablet By Mouth Once A Day 17)  Aspirin 81 Mg  Tbec (Aspirin) .... Take 1 Tablet By Mouth Once A Day 18)  Spiriva Handihaler 18 Mcg  Caps (Tiotropium Bromide Monohydrate) .Marland Kitchen.. 1 Puff Once Daily 19)  Advair Diskus 100-50 Mcg/dose  Misc  (Fluticasone-Salmeterol) .Marland Kitchen.. 1 Puff and Rinse, Twice Daily 20)  Calamine Lotion 21)  O2 24/7 2 L/m Advanced 22)  One Touch Ultra Mini Strips and Lancets .... Bs Two Times A Day 23)  Endocet 7.5-325 Mg Tabs (Oxycodone-Acetaminophen) .... Take 1 Three Times A Day 24)  Alendronate Sodium 70 Mg Tabs (Alendronate Sodium) .Marland Kitchen.. 1 By Mouth Qwk 25)  Proair Hfa 108 (90 Base) Mcg/act Aers (Albuterol Sulfate) .... 2 Puffs, Four Times A Day As Needed  "rescue" 26)  02 .... Pt Requires Oxygen Therapy 24/7 @ 2 Lpm Due To His Diagnosis of Copd 27)  Pulse Ox .... Dx 786.95; 496 28)  Cefprozil 500 Mg Tabs (Cefprozil) .... One By Mouth Twice A Day For 4 Days 29)  Flutter .Marland Kitchen.. 3 Times, 3 Times Daily As Needed  Allergies: 1)  Morphine Sulfate (Morphine Sulfate)  Past History:  Past Medical History: Reviewed history from 02/14/2009 and no changes required. AODM Dx 07-2008 A1C 6.2 RHEUMATOID ARTHRITIS  PERIPHERAL POLY NEUROPATHY PSORIASIS   HYPERTENSION   GASTROESOPHAGEAL REFLUX DISEASE  GASTRITIS   COPD CAD: MI 2007, CABG Osteoporosis by DEXA 10-09  Past Surgical History: Reviewed history from 02/14/2009 and no changes required. CABG 2007 ((LIMA to the LAD, left radial to obtuse marginal, SVG to first diagonal, SVG to PDA.  His ejection fraction is 50-55%),  L achiles tendon surgery  8-09  Review of Systems       As stated in the HPI and negative for all other systems.   Vital Signs:  Patient profile:   60 year old male Height:      74 inches Weight:      247 pounds BMI:     31.83 Pulse rate:   76 / minute BP sitting:   112 / 70  (left arm) Cuff size:   large  Vitals Entered By: Oswald Hillock (September 12, 2009 4:57 PM)  Physical Exam  General:  Well developed, well nourished, in no acute distress. Head:  normocephalic and atraumatic Eyes:  PERRLA/EOM intact; conjunctiva and lids normal. Mouth:  Teeth, gums and palate normal. Oral mucosa normal. Neck:  Neck supple, no JVD.  No masses, thyromegaly or abnormal cervical nodes. Chest Wall:  well-healed sternotomy scar Lungs:  Clear bilaterally to auscultation and percussion. Heart:  S1 and S2 within normal limits, no S3, no S4, no clicks, no rubs, no murmurs Abdomen:  Bowel sounds positive; abdomen soft and non-tender without masses, organomegaly, or hernias noted. No hepatosplenomegaly, obese Msk:  severe rheumatoid changes Extremities:  trace bilateral edema, his legs are in diabetic boots and I was unable to appreciate posterior tibialis or dorsalis pulses Neurologic:  Alert and oriented x 3. Skin:  Intact without lesions or rashes. Psych:  Normal affect.   EKG  Procedure date:  09/12/2009  Findings:      sinus rhythm, rate 76, low voltag no acute ST-T wave changes.e,  Impression & Recommendations:  Problem # 1:  CORONARY ARTERY DISEASE (ICD-414.00) He is doing well. He is to continue with risk reduction.  Problem # 2:  HYPERTENSION (ICD-401.9) His blood pressure is well-controlled. He will continue with the meds as listed.  Problem # 3:  DYSLIPIDEMIA (ICD-272.4) i reviewed his lipid profile from June. Triglycerides are 76, LDL 59 and HDL 35. This is reasonable. He will continue the meds as listed.  Other Orders: EKG w/ Interpretation (93000)  Patient Instructions: 1)  Your physician recommends that you schedule a follow-up appointment in: 6 months with Dr Antoine Poche 2)  Your physician recommends that you continue on your current medications as directed. Please refer to the Current Medication list given to you today. 3)  Your physician recommends that you return for a FASTING lipid and liver profile: 6 months Prescriptions: LIPITOR 80 MG  TABS (ATORVASTATIN CALCIUM) 1 by mouth qhs  #30 Tablet x 5   Entered by:   Charolotte Capuchin, RN   Authorized by:   Rollene Rotunda, MD, [facility]   Signed by:   Charolotte Capuchin, RN on 09/12/2009   Method used:   Electronically to        [facility] [address] (retail)       [address] [place], [place]  [address]       Ph: [phone] or [phone]       Fax: [phone]   RxID:   [id number] KLOR-CON M20 20 MEQ TBCR (POTASSIUM CHLORIDE CRYS CR) TID  #90 Tablet x 5   Entered by:   Charolotte Capuchin, RN   Authorized by:   Rollene Rotunda, MD, [facility]   Signed by:   Charolotte Capuchin, RN on 09/12/2009   Method used:   Electronically to        [facility] [address] (retail)  [address] [place], [place]  [address]       Ph: [phone] or [phone]       Fax: [phone]   RxID:   [id number] FUROSEMIDE 20 MG TABS (FUROSEMIDE) Take 2  tablet by mouth every morning and 1 at 4pm  #90 x 8   Entered by:   Charolotte Capuchin, RN   Authorized by:   Rollene Rotunda, MD, [facility]   Signed by:   Charolotte Capuchin, RN on 09/12/2009   Method used:   Electronically to        [facility] [address] (retail)       [address] [place], [place]  [address]       Ph: [phone] or [phone]       Fax: [phone]   RxID:   [id number] METOPROLOL TARTRATE 25 MG TABS (METOPROLOL TARTRATE) Take 1 tablet by mouth twice a day  #60 Tablet x 8   Entered by:   Charolotte Capuchin, RN   Authorized by:   Rollene Rotunda, MD, [facility]   Signed by:   Charolotte Capuchin, RN on 09/12/2009   Method used:   Electronically to        [facility] [address] (retail)       [address] [place], [place]  [address]       Ph: [phone] or [phone]       Fax: [phone]   RxID:   [id number]

## 2010-09-25 NOTE — Miscellaneous (Signed)
Summary: Care Plan/[facility]  Care Plan/[facility]   Imported By: Lanelle Bal 11/28/2009 10:11:30  _____________________________________________________________________  External Attachment:    Type:   Image     Comment:   External Document

## 2010-09-25 NOTE — Progress Notes (Signed)
Summary: rx  Phone Note Call from Patient Call back at Work Phone [phone]   Caller: Patient Call For: young Reason for Call: Talk to Nurse Summary of Call: need renewal on Advair 100/50.  Have an am dose but out after that.  Can you get this called in asap? [facility] [place] Initial call taken by: Eugene Gavia,  November 06, 2009 11:16 AM  Follow-up for Phone Call        refill sent. pt aware.Carron Curie CMA  November 06, 2009 11:20 AM     Prescriptions: ADVAIR DISKUS 100-50 MCG/DOSE  MISC (FLUTICASONE-SALMETEROL) 1 puff and rinse, twice daily  #60 x 4   Entered by:   Carron Curie CMA   Authorized by:   Waymon Budge MD   Signed by:   Carron Curie CMA on 11/06/2009   Method used:   Electronically to        [facility] [address] (retail)       [address] [place], [place]  [address]       Ph: [phone] or [phone]       Fax: [phone]   RxID:   [id number]

## 2010-09-25 NOTE — Letter (Signed)
Summary: planningn foot surgery   [facility]   Imported By: Lanelle Bal 03/09/2010 11:02:20  _____________________________________________________________________  External Attachment:    Type:   Image     Comment:   External Document

## 2010-09-27 NOTE — Progress Notes (Signed)
Summary: Pt assistance  Phone Note Call from Patient Call back at Work Phone [phone]   Caller: Patient Call For: Young Reason for Call: Talk to Nurse, Talk to Doctor Summary of Call: FYI-Pt wanted to inform Katie and Dr. Maple Hudson that he is mailing a form in from a Patient Assistance program [facility] based out of [place] for (Advair, Spiriva, ProAir) and due date to be returned is 08-11-10.  Pt also stated he has an additional question for Florentina Addison that he would not share with me. Initial call taken by: Zackery Barefoot CMA,  August 02, 2010 9:19 AM  Follow-up for Phone Call        Spoke with pt and he states he is mailing pt assistant forms to be completed by Dr. Maple Hudson. I advsied to put attention Katie onthe envelope to make sure they get to her so she can get forms complted by Dr. Maple Hudson. Pt states he will need this completed as soon as possible. I advised I willforward to Florentina Addison to make her aware forms will be coming.Carron Curie CMA  August 02, 2010 10:10 AM   Additional Follow-up for Phone Call Additional follow up Details #1::        Papers on CDYs cart.Reynaldo Minium CMA  August 08, 2010 8:56 AM   Paperwork for [facility] completed and faxed. Called and spoke with patient and he is aware that this has been addressed and faxed. Copy mailed to pt per his request. Alfonso Ramus  August 08, 2010 9:13 AM

## 2010-09-27 NOTE — Letter (Signed)
Summary: Statement of Treatment for Medication / [employer]  Statement of Treatment / [facility]   Imported By: Lennie Odor 08/14/2010 15:48:45  _____________________________________________________________________  External Attachment:    Type:   Image     Comment:   External Document

## 2010-09-27 NOTE — Progress Notes (Signed)
Summary: returned call  Phone Note Call from Patient   Caller: Patient Call For: young Summary of Call: pt returned call (says dr Roxy Cedar nurse called- he didn't know what for). [phone] Initial call taken by: Tivis Ringer, CNA,  September 19, 2010 3:10 PM  Follow-up for Phone Call        Spoke with pt.  He states that he thinks that someone has called him today from our office. I advised that there was no documentation of this and may want to check with PCP.  Pt verbalized understanding.   Pt had PFT last in May 2010. Wants to know when next PFT should be done.  He also wanted to let CDY know that he has been producing green sputum for the past several months but states that he does not feel sick at all.  Pls advise thanks Follow-up by: Vernie Murders,  September 19, 2010 3:53 PM  Additional Follow-up for Phone Call Additional follow up Details #1::        I know nothing of call.  He has scheduled appointment Jan 31. if he needs to be seen sooner let us know.  Additional Follow-up by: Waymon Budge MD,  September 20, 2010 8:52 AM    Additional Follow-up for Phone Call Additional follow up Details #2::    LMOMTCB.Michel Bickers CMA  September 20, 2010 9:23 AM  pt informed and transferred to the que 480 to schedule follow up. Zackery Barefoot CMA  September 20, 2010 10:22 AM

## 2010-09-27 NOTE — Letter (Signed)
Summary: [facility]  [facility]   Imported By: Lanelle Bal 08/16/2010 10:24:13  _____________________________________________________________________  External Attachment:    Type:   Image     Comment:   External Document

## 2010-09-27 NOTE — Medication Information (Signed)
Summary: Tax adviser   Imported By: Valinda Hoar 09/19/2010 15:49:07  _____________________________________________________________________  External Attachment:    Type:   Image     Comment:   External Document

## 2010-10-01 ENCOUNTER — Telehealth (INDEPENDENT_AMBULATORY_CARE_PROVIDER_SITE_OTHER): Payer: Self-pay | Admitting: *Deleted

## 2010-10-01 ENCOUNTER — Ambulatory Visit: Payer: Self-pay | Admitting: Internal Medicine

## 2010-10-03 ENCOUNTER — Ambulatory Visit: Payer: Self-pay | Admitting: Internal Medicine

## 2010-10-05 ENCOUNTER — Ambulatory Visit: Payer: Self-pay | Admitting: Internal Medicine

## 2010-10-09 ENCOUNTER — Ambulatory Visit (INDEPENDENT_AMBULATORY_CARE_PROVIDER_SITE_OTHER): Payer: BC Managed Care – PPO | Admitting: Internal Medicine

## 2010-10-09 ENCOUNTER — Encounter: Payer: Self-pay | Admitting: Internal Medicine

## 2010-10-09 ENCOUNTER — Other Ambulatory Visit: Payer: Self-pay | Admitting: Internal Medicine

## 2010-10-09 DIAGNOSIS — E785 Hyperlipidemia, unspecified: Secondary | ICD-10-CM

## 2010-10-09 DIAGNOSIS — I10 Essential (primary) hypertension: Secondary | ICD-10-CM

## 2010-10-09 DIAGNOSIS — E119 Type 2 diabetes mellitus without complications: Secondary | ICD-10-CM

## 2010-10-09 LAB — AST: AST: 25 U/L (ref 0–37)

## 2010-10-09 LAB — MICROALBUMIN / CREATININE URINE RATIO
Microalb Creat Ratio: 1.2 mg/g (ref 0.0–30.0)
Microalb, Ur: 1.8 mg/dL (ref 0.0–1.9)

## 2010-10-09 LAB — ALT: ALT: 31 U/L (ref 0–53)

## 2010-10-09 LAB — BASIC METABOLIC PANEL
Calcium: 9 mg/dL (ref 8.4–10.5)
GFR: 110.02 mL/min (ref >60.00)
Sodium: 140 mEq/L (ref 135–145)

## 2010-10-09 LAB — HEMOGLOBIN A1C: Hgb A1c MFr Bld: 6.1 % (ref 4.6–6.5)

## 2010-10-10 ENCOUNTER — Telehealth (INDEPENDENT_AMBULATORY_CARE_PROVIDER_SITE_OTHER): Payer: Self-pay | Admitting: *Deleted

## 2010-10-11 NOTE — Progress Notes (Signed)
Summary: Lipitor refill---has appt  = 2/8  Phone Note Refill Request Message from:  Patient on October 01, 2010 12:03 PM  Refills Requested: Medication #1:  LIPITOR 80 MG  TABS 1 by mouth qhs [facility] [address] [place], [place]       cancelled appt for Mon 2/6 due to transportation problems, resch for Wed 10/03/2010 at 11:45  Next Appointment Scheduled: wed 2/8   PAZ Initial call taken by: Jerolyn Shin,  October 01, 2010 12:04 PM  Follow-up for Phone Call        did not receive 09/21/10. Army Fossa CMA  October 01, 2010 1:19 PM     Prescriptions: LIPITOR 80 MG  TABS (ATORVASTATIN CALCIUM) 1 by mouth qhs  #30 Tablet x 0   Entered by:   Army Fossa CMA   Authorized by:   Nolon Rod. Paz MD   Signed by:   Army Fossa CMA on 10/01/2010   Method used:   Faxed to ...       [id number] (retail)       [address]       [place], [place]  [address]       Ph: [phone] or [phone]       Fax: [phone]   RxID:   [id number]

## 2010-10-17 NOTE — Progress Notes (Signed)
Summary: refill  Phone Note Refill Request Message from:  Fax from Pharmacy on October 10, 2010 10:44 AM  Refills Requested: Medication #1:  KLOR-CON M20 20 MEQ TBCR 2 by mouth qam and 1 by mouth at 4 pm. DUE FOR APPOINTMENT BEFORE ADDITIONAL REFILLS. [facility] [address]  [place] - fax [phone]  Initial call taken by: Okey Regal [place],  October 10, 2010 10:49 AM    Prescriptions: KLOR-CON M20 20 MEQ TBCR (POTASSIUM CHLORIDE CRYS CR) 2 by mouth qam and 1 by mouth at 4 pm. DUE FOR APPOINTMENT BEFORE ADDITIONAL REFILLS.  #90 x 1   Entered by:   Army Fossa CMA   Authorized by:   Nolon Rod. Paz MD   Signed by:   Army Fossa CMA on 10/10/2010   Method used:   Faxed to ...       [facility] (retail)       [address]       [place], [place]  [address]       Ph: [phone] or [phone]       Fax: [phone]   RxID:   [id number]

## 2010-10-17 NOTE — Assessment & Plan Note (Signed)
Summary: refill/cbs   Vital Signs:  Patient profile:   60 year old male Height:      74 inches Weight:      248 pounds Temp:     99.3 degrees F oral Pulse rate:   92 / minute BP sitting:   110 / 76  (left arm)  Vitals Entered By: Jeremy Johann CMA (October 09, 2010 12:38 PM) CC: refilla Comments -not fasting --no concerns   History of Present Illness: ROV  wound L foot, osteomyelitis s/p surgery 03-2010--- finally better, cleared by his wound doctor, will be able to restart exercise, water exercise s, etc AODM -- inactive lately d/t above, planning to be more active now. no ambulatory CBGs  RHEUMATOID ARTHRITIS --  f/u by  rheumatology  COPD-- per Dr Eldridge Dace, note from 8-11 reviewed. pt  had a flu shot this seasson     Current Medications (verified): 1)  Methotrexate (Anti-Rheumatic) 2.5 Mg Tabs (Methotrexate (Anti-Rheumatic)) .... 2.5 Mg 8 Tabs Q Week 2)  Folic Acid 400 Mcg Tabs (Folic Acid) .Marland Kitchen.. Once Daily 3)  Prednisone .... 6mg  Once Daily 4)  Lyrica 150 Mg Caps (Pregabalin) .Marland KitchenMarland KitchenMarland Kitchen 150 Mg Three Times A Day 5)  Prilosec Otc 20 Mg Tbec (Omeprazole Magnesium) .Marland Kitchen.. 1 By Mouth Q Am. Due For Office Visit Before Additional Refills. 6)  Multivitamins Tabs (Multiple Vitamin) .... Once Daily 7)  Vitamin C 100 Mg Tabs (Ascorbic Acid) .... Once Daily 8)  Metoprolol Tartrate 25 Mg Tabs (Metoprolol Tartrate) .... Take 1 Tablet By Mouth Twice A Day 9)  Furosemide 20 Mg Tabs (Furosemide) .... Take 2  Tablet By Mouth Every Morning and 1 At 4pm 10)  Amitriptyline Hcl 75 Mg Tabs (Amitriptyline Hcl) .... 2 By Mouth At Bedtime 11)  Alprazolam 0.5 Mg Tabs (Alprazolam) .... As Needed For Sleep 12)  Klor-Con M20 20 Meq Tbcr (Potassium Chloride Crys Cr) .... 2 By Mouth Qam and 1 By Mouth At 4 Pm. Due For Appointment Before Additional Refills. 13)  Lipitor 80 Mg  Tabs (Atorvastatin Calcium) .Marland Kitchen.. 1 By Mouth Qhs 14)  B-12 500 Mcg Tabs (Cyanocobalamin) .... Take 1 Tablet By Mouth Once A  Day 15)  Perfect Iron 50 Mg Tabs (Iron) .... Take 1 Tablet By Mouth Once A Day 16)  Aspirin 81 Mg  Tbec (Aspirin) .... Take 1 Tablet By Mouth Once A Day 17)  Spiriva Handihaler 18 Mcg  Caps (Tiotropium Bromide Monohydrate) .Marland Kitchen.. 1 Puff Once Daily.needs Office Visit. 18)  Advair Diskus 100-50 Mcg/dose  Misc (Fluticasone-Salmeterol) .Marland Kitchen.. 1 Puff and Rinse, Twice Daily 19)  Calamine Lotion 20)  One Touch Ultra Mini Strips and Lancets .... Bs Two Times A Day 21)  Endocet 7.5-325 Mg Tabs (Oxycodone-Acetaminophen) .... Take 1 Four Times A Day 22)  Alendronate Sodium 70 Mg Tabs (Alendronate Sodium) .Marland Kitchen.. 1 By Mouth Qwk 23)  Proair Hfa 108 (90 Base) Mcg/act Aers (Albuterol Sulfate) .... 2 Puffs, Four Times A Day As Needed  "rescue" 24)  Nitrostat 0.4 Mg Subl (Nitroglycerin) .Marland Kitchen.. 1 By Mouth As Needed For Chest Pain 25)  Oxygen .... 2 L/m 26)  Morphine Sulfate 30 Mg Tabs (Morphine Sulfate) .... Take 1 By Mouth Three Times A Day  Allergies (verified): 1)  Morphine Sulfate (Morphine Sulfate)  Past History:  Past Medical History: AODM Dx 07-2008 A1C 6.2 RHEUMATOID ARTHRITIS  PERIPHERAL POLY NEUROPATHY -----wound L foot, osteomyelitis s/p surgery 03-2010 PSORIASIS   HYPERTENSION   GASTROESOPHAGEAL REFLUX DISEASE  GASTRITIS   COPD CAD:  MI 2007, CABG Osteoporosis by DEXA 10-09  Past Surgical History: Reviewed history from 04/06/2010 and no changes required. CABG 2007 ((LIMA to the LAD, left radial to obtuse marginal, SVG to first diagonal, SVG to PDA.  His ejection fraction is 50-55%),  L achiles tendon surgery 8-09 Cholecystectomy 2-11 Foot surgery for tendon release and repair of osteomyelitis  Social History: Reviewed history from 06/21/2009 and no changes required. Married No children  Former smoker.  Quit in 2007.  Smoked for 40 years at 1 ppd ETOH--no disabled-retired   Review of Systems Resp:  Denies wheezing; occasionally cough ,occ sputum , greenish on-off . GI:  Denies bloody  stools, diarrhea, nausea, and vomiting.  Physical Exam  General:  alert and well-developed.  no distress Lungs:  normal respiratory effort, no intercostal retractions, no accessory muscle use, and normal breath sounds.   Heart:  normal rate, regular rhythm, and no murmur.  Extremities:  no lower extremity edema on either side  Psych:  Oriented X3, normally interactive, good eye contact, not anxious appearing, and not depressed appearing.  seems in good spiritsOriented X3, normally interactive, good eye contact, not anxious appearing, and not depressed appearing.     Impression & Recommendations:  Problem # 1:  DYSLIPIDEMIA (ICD-272.4) at goal  His updated medication list for this problem includes:    Lipitor 80 Mg Tabs (Atorvastatin calcium) .Marland Kitchen... 1 by mouth qhs  Orders: TLB-ALT (SGPT) (84460-ALT) TLB-AST (SGOT) (84450-SGOT)  Labs Reviewed: SGOT: 26 (08/17/2010)   SGPT: 34 (08/17/2010)   HDL:38.90 (03/26/2010), 35.00 (02/16/2009)  LDL:68 (03/26/2010), 59 (02/16/2009)  Chol:123 (03/26/2010), 109 (02/16/2009)  Trig:83.0 (03/26/2010), 76.0 (02/16/2009)  Problem # 2:  AODM (ICD-250.00) labs  His updated medication list for this problem includes:    Aspirin 81 Mg Tbec (Aspirin) .Marland Kitchen... Take 1 tablet by mouth once a day  Labs Reviewed: Creat: 0.7 (08/17/2010)    Reviewed HgBA1c results: 6.1 (06/21/2009)  Orders: TLB-A1C / Hgb A1C (Glycohemoglobin) (83036-A1C) TLB-Microalbumin/Creat Ratio, Urine (82043-MALB) Specimen Handling (29562)  Problem # 3:  HYPERTENSION (ICD-401.9) at goal  His updated medication list for this problem includes:    Metoprolol Tartrate 25 Mg Tabs (Metoprolol tartrate) .Marland Kitchen... Take 1 tablet by mouth twice a day    Furosemide 20 Mg Tabs (Furosemide) .Marland Kitchen... Take 2  tablet by mouth every morning and 1 at 4pm    BP today: 110/76 Prior BP: 126/82 (04/06/2010)  Labs Reviewed: K+: 4.5 (11/10/2009) Creat: : 0.7 (08/17/2010)   Chol: 123 (03/26/2010)   HDL: 38.90  (03/26/2010)   LDL: 68 (03/26/2010)   TG: 83.0 (03/26/2010)  Orders: Venipuncture (13086) TLB-BMP (Basic Metabolic Panel-BMET) (80048-METABOL) Specimen Handling (57846)  Problem # 4:  other issues temperature today 99.3, this is a little higher for the patient, he feels okay,he will call me if something changes. A permanent handicap parking document done   Complete Medication List: 1)  Methotrexate (anti-rheumatic) 2.5 Mg Tabs (Methotrexate (anti-rheumatic)) .... 2.5 mg 8 tabs q week 2)  Folic Acid 400 Mcg Tabs (Folic acid) .Marland Kitchen.. once daily 3)  Prednisone  .... 6mg  once daily 4)  Lyrica 150 Mg Caps (Pregabalin) .Marland KitchenMarland KitchenMarland Kitchen 150 mg three times a day 5)  Prilosec Otc 20 Mg Tbec (Omeprazole magnesium) .Marland Kitchen.. 1 by mouth q am. due for office visit before additional refills. 6)  Multivitamins Tabs (Multiple vitamin) .... Once daily 7)  Vitamin C 100 Mg Tabs (Ascorbic acid) .... Once daily 8)  Metoprolol Tartrate 25 Mg Tabs (Metoprolol tartrate) .... Take 1 tablet  by mouth twice a day 9)  Furosemide 20 Mg Tabs (Furosemide) .... Take 2  tablet by mouth every morning and 1 at 4pm 10)  Amitriptyline Hcl 75 Mg Tabs (Amitriptyline hcl) .... 2 by mouth at bedtime 11)  Alprazolam 0.5 Mg Tabs (Alprazolam) .... As needed for sleep 12)  Klor-con M20 20 Meq Tbcr (Potassium chloride crys cr) .... 2 by mouth qam and 1 by mouth at 4 pm. due for appointment before additional refills. 13)  Lipitor 80 Mg Tabs (Atorvastatin calcium) .Marland Kitchen.. 1 by mouth qhs 14)  B-12 500 Mcg Tabs (Cyanocobalamin) .... Take 1 tablet by mouth once a day 15)  Perfect Iron 50 Mg Tabs (Iron) .... Take 1 tablet by mouth once a day 16)  Aspirin 81 Mg Tbec (Aspirin) .... Take 1 tablet by mouth once a day 17)  Spiriva Handihaler 18 Mcg Caps (Tiotropium bromide monohydrate) .Marland Kitchen.. 1 puff once daily.needs office visit. 18)  Advair Diskus 100-50 Mcg/dose Misc (Fluticasone-salmeterol) .Marland Kitchen.. 1 puff and rinse, twice daily 19)  Calamine Lotion  20)  One  Touch Ultra Mini Strips and Lancets  .... Bs two times a day 21)  Endocet 7.5-325 Mg Tabs (Oxycodone-acetaminophen) .... Take 1 four times a day 22)  Alendronate Sodium 70 Mg Tabs (Alendronate sodium) .Marland Kitchen.. 1 by mouth qwk 23)  Proair Hfa 108 (90 Base) Mcg/act Aers (Albuterol sulfate) .... 2 puffs, four times a day as needed  "rescue" 24)  Nitrostat 0.4 Mg Subl (Nitroglycerin) .Marland Kitchen.. 1 by mouth as needed for chest pain 25)  Oxygen  .... 2 l/m 26)  Morphine Sulfate 30 Mg Tabs (Morphine sulfate) .... Take 1 by mouth three times a day  Patient Instructions: 1)  Please schedule a follow-up appointment in 4 to 5 months, fasting physical exam    Orders Added: 1)  Venipuncture [36415] 2)  TLB-BMP (Basic Metabolic Panel-BMET) [80048-METABOL] 3)  TLB-ALT (SGPT) [84460-ALT] 4)  TLB-AST (SGOT) [84450-SGOT] 5)  TLB-A1C / Hgb A1C (Glycohemoglobin) [83036-A1C] 6)  TLB-Microalbumin/Creat Ratio, Urine [82043-MALB] 7)  Specimen Handling [99000] 8)  Est. Patient Level IV [16109]

## 2010-10-22 ENCOUNTER — Ambulatory Visit (INDEPENDENT_AMBULATORY_CARE_PROVIDER_SITE_OTHER): Payer: BC Managed Care – PPO | Admitting: Internal Medicine

## 2010-10-22 ENCOUNTER — Encounter: Payer: Self-pay | Admitting: Internal Medicine

## 2010-10-22 DIAGNOSIS — R490 Dysphonia: Secondary | ICD-10-CM | POA: Insufficient documentation

## 2010-10-22 DIAGNOSIS — J449 Chronic obstructive pulmonary disease, unspecified: Secondary | ICD-10-CM

## 2010-10-30 ENCOUNTER — Telehealth (INDEPENDENT_AMBULATORY_CARE_PROVIDER_SITE_OTHER): Payer: Self-pay | Admitting: *Deleted

## 2010-11-01 NOTE — Assessment & Plan Note (Signed)
Summary: rov/jd   Copy to:  Dr. Drue Novel Primary Provider/Referring Provider:  Dr. Drue Novel  CC:  Follow up visit-COPD.Marland Kitchen  History of Present Illness:  September 11, 2009- COPD, RA................................................Marland Kitchenwife here He was more dyspneic for a while this summer after he got celluitis in left foot and retained fluid for a while. He uses Mucinex to clear thick mucus, usually scant with some green. Little awareness of sinus congestion or postnasal drip.We introduced idea of Neti pot to see if that might help.  Mouth gets very dry.  He gives out much faster if he comes off his oxygen.  April 06, 2010- COPD, RA........................Marland KitchenWife here When he uses Flutter and incentive Spirometer regularly, he does well at clearing secretions, but if he misses he falls behind on congestion quickly. Has been getting IV antibiotics/ cefipime via PICC after left foot surgery for osteomyelitis. He remains on continuous O2 @ 2L via portable concentrator. We discused timing of vaccinations for flu and pneumovax..  October 22, 2010- COPD, RA........................Marland KitchenWife here Nurse-CC: Follow up visit-COPD. O2- 2 l/m continuous. Remains on prednisone for his RA.  Had flu vax. 2-3 weeks ago had bronchits with green sputum, now resolved. Noting higher pitched voice over last several weeks, mild hoarseness with little phlegm on throat clearing. RA control is stable, including methotrexate and morphine. Has noted 2 episodes of severe reflux that woke him, taking prilosec in AM after meal. Discussed reflux, Advair and RA as potentially affecting his voice. Denies sore throat or any choking with meal or drink and no hangup. Shortness of breath is noted with exertion of climbing steps.       Preventive Screening-Counseling & Management  Alcohol-Tobacco     Smoking Status: quit     Year Started: Age 75     Year Quit: 03-2006  Current Medications (verified): 1)  Methotrexate (Anti-Rheumatic) 2.5 Mg  Tabs (Methotrexate (Anti-Rheumatic)) .... 2.5 Mg 8 Tabs Q Week 2)  Folic Acid 400 Mcg Tabs (Folic Acid) .Marland Kitchen.. Once Daily 3)  Prednisone .... 6mg  Once Daily 4)  Lyrica 150 Mg Caps (Pregabalin) .Marland KitchenMarland KitchenMarland Kitchen 150 Mg Three Times A Day 5)  Prilosec Otc 20 Mg Tbec (Omeprazole Magnesium) .Marland Kitchen.. 1 By Mouth Q Am. Due For Office Visit Before Additional Refills. 6)  Multivitamins Tabs (Multiple Vitamin) .... Once Daily 7)  Vitamin C 100 Mg Tabs (Ascorbic Acid) .... Once Daily 8)  Metoprolol Tartrate 25 Mg Tabs (Metoprolol Tartrate) .... Take 1 Tablet By Mouth Twice A Day 9)  Furosemide 20 Mg Tabs (Furosemide) .... Take 2  Tablet By Mouth Every Morning and 1 At 4pm 10)  Amitriptyline Hcl 75 Mg Tabs (Amitriptyline Hcl) .... 2 By Mouth At Bedtime 11)  Alprazolam 0.5 Mg Tabs (Alprazolam) .... As Needed For Sleep 12)  Klor-Con M20 20 Meq Tbcr (Potassium Chloride Crys Cr) .... 2 By Mouth Qam and 1 By Mouth At 4 Pm. Due For Appointment Before Additional Refills. 13)  Lipitor 80 Mg  Tabs (Atorvastatin Calcium) .Marland Kitchen.. 1 By Mouth Qhs 14)  B-12 500 Mcg Tabs (Cyanocobalamin) .... Take 1 Tablet By Mouth Once A Day 15)  Perfect Iron 50 Mg Tabs (Iron) .... Take 1 Tablet By Mouth Once A Day 16)  Aspirin 81 Mg  Tbec (Aspirin) .... Take 1 Tablet By Mouth Once A Day 17)  Spiriva Handihaler 18 Mcg  Caps (Tiotropium Bromide Monohydrate) .Marland Kitchen.. 1 Puff Once Daily.needs Office Visit. 18)  Advair Diskus 100-50 Mcg/dose  Misc (Fluticasone-Salmeterol) .Marland Kitchen.. 1 Puff and Rinse, Twice Daily 19)  Calamine Lotion 20)  One Touch Ultra Mini Strips and Lancets .... Bs Two Times A Day 21)  Endocet 7.5-325 Mg Tabs (Oxycodone-Acetaminophen) .... Take 1 Four Times A Day 22)  Alendronate Sodium 70 Mg Tabs (Alendronate Sodium) .Marland Kitchen.. 1 By Mouth Qwk 23)  Proair Hfa 108 (90 Base) Mcg/act Aers (Albuterol Sulfate) .... 2 Puffs, Four Times A Day As Needed  "rescue" 24)  Nitrostat 0.4 Mg Subl (Nitroglycerin) .Marland Kitchen.. 1 By Mouth As Needed For Chest Pain 25)  Oxygen  .... 2 L/m 26)  Morphine Sulfate 15 Mg Tabs (Morphine Sulfate) .... Take 1 By Mouth Four Times A Day  Allergies (verified): No Known Drug Allergies  Past History:  Past Medical History: Last updated: 10/09/2010 AODM Dx 07-2008 A1C 6.2 RHEUMATOID ARTHRITIS  PERIPHERAL POLY NEUROPATHY -----wound L foot, osteomyelitis s/p surgery 03-2010 PSORIASIS   HYPERTENSION   GASTROESOPHAGEAL REFLUX DISEASE  GASTRITIS   COPD CAD: MI 2007, CABG Osteoporosis by DEXA 10-09  Family History: Last updated: 06/21/2009 MI -- F 2 MIs, still living 60 y/o heart disease-- F M DM--no Colon ca-- no prostate ca--no  Social History: Last updated: 06/21/2009 Married No children  Former smoker.  Quit in 2007.  Smoked for 40 years at 1 ppd ETOH--no disabled-retired   Risk Factors: Smoking Status: quit (10/22/2010)  Past Surgical History: CABG 2007 ((LIMA to the LAD, left radial to obtuse marginal, SVG to first diagonal, SVG to PDA.  His ejection fraction is 50-55%),  L achiles tendon surgery 8-09 Cholecystectomy 2-11 Foot surgery for tendon release and repair of osteomyelitis Cholecystectomy  Review of Systems      See HPI       The patient complains of shortness of breath with activity.  The patient denies shortness of breath at rest, productive cough, non-productive cough, coughing up blood, chest pain, irregular heartbeats, acid heartburn, indigestion, loss of appetite, weight change, abdominal pain, difficulty swallowing, sore throat, tooth/dental problems, headaches, nasal congestion/difficulty breathing through nose, and sneezing.    Vital Signs:  Patient profile:   60 year old male Height:      74 inches Weight:      242.13 pounds BMI:     31.20 O2 Sat:      96 % on 2 L/min Pulse rate:   84 / minute BP sitting:   120 / 78  (left arm) Cuff size:   regular  Vitals Entered By: Reynaldo Minium CMA (October 22, 2010 4:17 PM)  O2 Flow:  2 L/min CC: Follow up  visit-COPD.   Physical Exam  Additional Exam:  General: A/Ox3; pleasant and cooperative, NAD, supplemental oxygen- 96% at rest on 2 L concentrator SKIN: no rash, lesions NODES: no lymphadenopathy HEENT: [place]/AT, EOM- WNL, Conjuctivae- clear, PERRLA, TM-WNL, Nose- clear, Throat- clear and wnl, Mallampati  III, very dry mucosa, hoarse, no stridor NECK: Supple w/ fair ROM, JVD- none, normal carotid impulses w/o bruits Thyroid-  CHEST: Clear to P&A, no cough,  HEART: RRR, no m/g/r heard ABDOMEN: Soft and nl; FAO:ZHYQ, nl pulses, no edema , rheumatoid changes- hands; brace left lower leg. NEURO: Grossly intact to observation      Impression & Recommendations:  Problem # 1:  COPD (ICD-496) Chronic respiratory failure with a bronchitic exacerbation a few weeks ago, now resolved.  O2 sat is good today at rest on room air and we discussed opportunity to take it off while sitting quietly.   Problem # 2:  HOARSENESS (MVH-846.96)  Recent hoarseness over past month. This may  have begun with the bronchitis and indoor heat. Consider if RA could be affecting his cords. Main concern is with the episodes of reflux he describes. I asked him to take the prilosec two times a day before meals for one month observation and will increase his script for now to cover. Advair can cause hoarseness but he has been on that much longer than the voice change.  longer. There is no thrush now.   Medications Added to Medication List This Visit: 1)  Prilosec 20 Mg Cpdr (Omeprazole) .Marland Kitchen.. 1 two times a day before meals 2)  Morphine Sulfate 15 Mg Tabs (Morphine sulfate) .... Take 1 by mouth four times a day  Other Orders: Est. Patient Level III (16109)  Patient Instructions: 1)  Please schedule a follow-up appointment in 6 months. 2)  Scripts for Prilosec to take it twice daily before meals, Advair and Spiriva.  3)  if you continue having hoarseness, ask Dr Drue Novel about ENT evaluation of your vocal cords.   Prescriptions: ADVAIR DISKUS 100-50 MCG/DOSE  MISC (FLUTICASONE-SALMETEROL) 1 puff and rinse, twice daily  #60 x 5   Entered and Authorized by:   Waymon Budge MD   Signed by:   Waymon Budge MD on 10/22/2010   Method used:   Print then Give to Patient   RxID:   [id number] SPIRIVA HANDIHALER 18 MCG  CAPS (TIOTROPIUM BROMIDE MONOHYDRATE) 1 puff once daily.NEEDS OFFICE VISIT.  #30 Capsule x prn   Entered and Authorized by:   Waymon Budge MD   Signed by:   Waymon Budge MD on 10/22/2010   Method used:   Print then Give to Patient   RxID:   [id number] PRILOSEC 20 MG CPDR (OMEPRAZOLE) 1 two times a day before meals  #60 x 5   Entered and Authorized by:   Waymon Budge MD   Signed by:   Waymon Budge MD on 10/22/2010   Method used:   Print then Give to Patient   RxID:   [id number]

## 2010-11-02 DIAGNOSIS — M059 Rheumatoid arthritis with rheumatoid factor, unspecified: Secondary | ICD-10-CM | POA: Insufficient documentation

## 2010-11-06 NOTE — Progress Notes (Signed)
Summary: needs another PERMANENT handicap placard request  Phone Note Call from Patient Call back at Work Phone [phone]   Caller: Patient Summary of Call: patient received paperwork for Permanent Handicap placard from Dr Drue Novel, ---he sent it in, but DMV has lost it---can we fill out another PERMANENT placard request for this patient and mail it to him ?? Initial call taken by: Jerolyn Shin,  October 30, 2010 11:01 AM  Follow-up for Phone Call        form fill out awaiting signature.Doristine Devoid CMA  October 30, 2010 4:24 PM  mailed to patient.Marland KitchenMarland KitchenMarland KitchenDoristine Devoid CMA  October 30, 2010 4:52 PM

## 2010-11-14 LAB — COMPREHENSIVE METABOLIC PANEL
ALT: 42 U/L (ref 0–53)
ALT: 56 U/L — ABNORMAL HIGH (ref 0–53)
AST: 49 U/L — ABNORMAL HIGH (ref 0–37)
AST: 36 U/L (ref 0–37)
Albumin: 2.6 g/dL — ABNORMAL LOW (ref 3.5–5.2)
Albumin: 3.2 g/dL — ABNORMAL LOW (ref 3.5–5.2)
Albumin: 4.2 g/dL (ref 3.5–5.2)
Alkaline Phosphatase: 107 U/L (ref 39–117)
Alkaline Phosphatase: 80 U/L (ref 39–117)
BUN: 10 mg/dL (ref 6–23)
BUN: 7 mg/dL (ref 6–23)
BUN: 7 mg/dL (ref 6–23)
CO2: 25 mEq/L (ref 19–32)
CO2: 29 mEq/L (ref 19–32)
Calcium: 8.1 mg/dL — ABNORMAL LOW (ref 8.4–10.5)
Calcium: 8.8 mg/dL (ref 8.4–10.5)
Creatinine, Ser: 0.7 mg/dL (ref 0.4–1.5)
Creatinine, Ser: 0.71 mg/dL (ref 0.4–1.5)
Creatinine, Ser: 0.6 mg/dL (ref 0.4–1.5)
GFR calc Af Amer: >60 mL/min (ref >60)
GFR calc Af Amer: >60 mL/min (ref >60)
GFR calc non Af Amer: >60 mL/min (ref >60)
GFR calc non Af Amer: >60 mL/min (ref >60)
Glucose, Bld: 139 mg/dL — ABNORMAL HIGH (ref 70–99)
Glucose, Bld: 85 mg/dL (ref 70–99)
Potassium: 3.1 mEq/L — ABNORMAL LOW (ref 3.5–5.1)
Potassium: 4.2 mEq/L (ref 3.5–5.1)
Sodium: 136 mEq/L (ref 135–145)
Sodium: 141 mEq/L (ref 135–145)
Total Bilirubin: 0.8 mg/dL (ref 0.3–1.2)
Total Bilirubin: 1.4 mg/dL — ABNORMAL HIGH (ref 0.3–1.2)
Total Protein: 5.8 g/dL — ABNORMAL LOW (ref 6.0–8.3)
Total Protein: 6.2 g/dL (ref 6.0–8.3)
Total Protein: 7 g/dL (ref 6.0–8.3)

## 2010-11-14 LAB — DIFFERENTIAL
Basophils Absolute: 0 10*3/uL (ref 0.0–0.1)
Basophils Absolute: 0 10*3/uL (ref 0.0–0.1)
Basophils Relative: 0 % (ref 0–1)
Basophils Relative: 0 % (ref 0–1)
Basophils Relative: 0 % (ref 0–1)
Basophils Relative: 2 % — ABNORMAL HIGH (ref 0–1)
Eosinophils Absolute: 0.3 10*3/uL (ref 0.0–0.7)
Eosinophils Absolute: 0.4 10*3/uL (ref 0.0–0.7)
Eosinophils Relative: 2 % (ref 0–5)
Eosinophils Relative: 3 % (ref 0–5)
Eosinophils Relative: 4 % (ref 0–5)
Eosinophils Relative: 1 % (ref 0–5)
Lymphocytes Relative: 2 % — ABNORMAL LOW (ref 12–46)
Lymphocytes Relative: 3 % — ABNORMAL LOW (ref 12–46)
Lymphocytes Relative: 4 % — ABNORMAL LOW (ref 12–46)
Lymphs Abs: 0.6 10*3/uL — ABNORMAL LOW (ref 0.7–4.0)
Lymphs Abs: 0.6 10*3/uL — ABNORMAL LOW (ref 0.7–4.0)
Lymphs Abs: 1 10*3/uL (ref 0.7–4.0)
Lymphs Abs: 1.3 10*3/uL (ref 0.7–4.0)
Monocytes Absolute: 0.9 10*3/uL (ref 0.1–1.0)
Monocytes Absolute: 0.9 10*3/uL (ref 0.1–1.0)
Monocytes Absolute: 1.1 10*3/uL — ABNORMAL HIGH (ref 0.1–1.0)
Monocytes Relative: 5 % (ref 3–12)
Monocytes Relative: 5 % (ref 3–12)
Monocytes Relative: 6 % (ref 3–12)
Monocytes Relative: 8 % (ref 3–12)
Monocytes Relative: 8 % (ref 3–12)
Monocytes Relative: 6 % (ref 3–12)
Neutro Abs: 12.6 10*3/uL — ABNORMAL HIGH (ref 1.7–7.7)
Neutro Abs: 12.9 10*3/uL — ABNORMAL HIGH (ref 1.7–7.7)
Neutro Abs: 17.1 10*3/uL — ABNORMAL HIGH (ref 1.7–7.7)
Neutro Abs: 17.5 10*3/uL — ABNORMAL HIGH (ref 1.7–7.7)
Neutro Abs: 13.8 10*3/uL — ABNORMAL HIGH (ref 1.7–7.7)
Neutrophils Relative %: 78 % — ABNORMAL HIGH (ref 43–77)
Neutrophils Relative %: 84 % — ABNORMAL HIGH (ref 43–77)
Neutrophils Relative %: 89 % — ABNORMAL HIGH (ref 43–77)

## 2010-11-14 LAB — URINALYSIS, ROUTINE W REFLEX MICROSCOPIC
Bilirubin Urine: NEGATIVE
Nitrite: NEGATIVE
Specific Gravity, Urine: 1.019 (ref 1.005–1.030)
Urobilinogen, UA: 0.2 mg/dL (ref 0.0–1.0)

## 2010-11-14 LAB — CBC
HCT: 33 % — ABNORMAL LOW (ref 39.0–52.0)
HCT: 35.3 % — ABNORMAL LOW (ref 39.0–52.0)
HCT: 36.4 % — ABNORMAL LOW (ref 39.0–52.0)
HCT: 38.7 % — ABNORMAL LOW (ref 39.0–52.0)
HCT: 44.9 % (ref 39.0–52.0)
Hemoglobin: 11.7 g/dL — ABNORMAL LOW (ref 13.0–17.0)
Hemoglobin: 12 g/dL — ABNORMAL LOW (ref 13.0–17.0)
Hemoglobin: 12.2 g/dL — ABNORMAL LOW (ref 13.0–17.0)
Hemoglobin: 12.4 g/dL — ABNORMAL LOW (ref 13.0–17.0)
MCHC: 33.7 g/dL (ref 30.0–36.0)
MCHC: 33.9 g/dL (ref 30.0–36.0)
MCHC: 34 g/dL (ref 30.0–36.0)
MCHC: 34.6 g/dL (ref 30.0–36.0)
MCHC: 33.5 g/dL (ref 30.0–36.0)
MCV: 90.9 fL (ref 78.0–100.0)
MCV: 92.6 fL (ref 78.0–100.0)
MCV: 92.9 fL (ref 78.0–100.0)
MCV: 92 fL (ref 78.0–100.0)
Platelets: 181 10*3/uL (ref 150–400)
Platelets: 220 10*3/uL (ref 150–400)
Platelets: 236 10*3/uL (ref 150–400)
Platelets: 243 10*3/uL (ref 150–400)
RBC: 3.63 MIL/uL — ABNORMAL LOW (ref 4.22–5.81)
RBC: 3.77 MIL/uL — ABNORMAL LOW (ref 4.22–5.81)
RDW: 15.6 % — ABNORMAL HIGH (ref 11.5–15.5)
RDW: 15.6 % — ABNORMAL HIGH (ref 11.5–15.5)
RDW: 15.7 % — ABNORMAL HIGH (ref 11.5–15.5)
RDW: 16.6 % — ABNORMAL HIGH (ref 11.5–15.5)
WBC: 10.4 10*3/uL (ref 4.0–10.5)
WBC: 12.4 10*3/uL — ABNORMAL HIGH (ref 4.0–10.5)
WBC: 14.6 10*3/uL — ABNORMAL HIGH (ref 4.0–10.5)
WBC: 16.6 10*3/uL — ABNORMAL HIGH (ref 4.0–10.5)

## 2010-11-14 LAB — BASIC METABOLIC PANEL
BUN: 5 mg/dL — ABNORMAL LOW (ref 6–23)
BUN: 6 mg/dL (ref 6–23)
CO2: 25 mEq/L (ref 19–32)
CO2: 25 mEq/L (ref 19–32)
Calcium: 7.9 mg/dL — ABNORMAL LOW (ref 8.4–10.5)
Calcium: 8.2 mg/dL — ABNORMAL LOW (ref 8.4–10.5)
Chloride: 102 mEq/L (ref 96–112)
Creatinine, Ser: 0.62 mg/dL (ref 0.4–1.5)
Creatinine, Ser: 0.65 mg/dL (ref 0.4–1.5)
GFR calc Af Amer: >60 mL/min (ref >60)
GFR calc Af Amer: >60 mL/min (ref >60)
GFR calc non Af Amer: >60 mL/min (ref >60)
GFR calc non Af Amer: >60 mL/min (ref >60)
Glucose, Bld: 202 mg/dL — ABNORMAL HIGH (ref 70–99)
Potassium: 3.4 mEq/L — ABNORMAL LOW (ref 3.5–5.1)
Potassium: 3.5 mEq/L (ref 3.5–5.1)
Sodium: 135 mEq/L (ref 135–145)
Sodium: 135 mEq/L (ref 135–145)

## 2010-11-14 LAB — CULTURE, BLOOD (ROUTINE X 2)

## 2010-11-14 LAB — POCT CARDIAC MARKERS: Myoglobin, poc: 115 ng/mL (ref 12–200)

## 2010-11-14 LAB — APTT: aPTT: 20 seconds — ABNORMAL LOW (ref 24–37)

## 2010-11-14 LAB — PROTIME-INR: INR: 1 (ref 0.00–1.49)

## 2010-11-14 LAB — GLUCOSE, CAPILLARY: Glucose-Capillary: 142 mg/dL — ABNORMAL HIGH (ref 70–99)

## 2010-11-27 ENCOUNTER — Encounter: Payer: Self-pay | Admitting: Cardiology

## 2010-11-27 ENCOUNTER — Ambulatory Visit (INDEPENDENT_AMBULATORY_CARE_PROVIDER_SITE_OTHER): Payer: BC Managed Care – PPO | Admitting: Cardiology

## 2010-11-27 VITALS — BP 113/45 | HR 73 | Ht 73.0 in | Wt 244.0 lb

## 2010-11-27 DIAGNOSIS — I2510 Atherosclerotic heart disease of native coronary artery without angina pectoris: Secondary | ICD-10-CM

## 2010-11-27 DIAGNOSIS — Z01818 Encounter for other preprocedural examination: Secondary | ICD-10-CM

## 2010-11-27 DIAGNOSIS — I10 Essential (primary) hypertension: Secondary | ICD-10-CM

## 2010-11-27 DIAGNOSIS — E785 Hyperlipidemia, unspecified: Secondary | ICD-10-CM

## 2010-11-27 LAB — DIFFERENTIAL
Basophils Absolute: 0.1 10*3/uL (ref 0.0–0.1)
Eosinophils Absolute: 0.4 10*3/uL (ref 0.0–0.7)
Eosinophils Relative: 4 % (ref 0–5)

## 2010-11-27 LAB — POCT B-TYPE NATRIURETIC PEPTIDE (BNP): B Natriuretic Peptide, POC: 79.6 pg/mL (ref 0–100)

## 2010-11-27 LAB — CBC
MCHC: 34.2 g/dL (ref 30.0–36.0)
MCV: 92.9 fL (ref 78.0–100.0)
RBC: 4.21 MIL/uL — ABNORMAL LOW (ref 4.22–5.81)

## 2010-11-27 LAB — COMPREHENSIVE METABOLIC PANEL
AST: 28 U/L (ref 0–37)
CO2: 27 mEq/L (ref 19–32)
Calcium: 9 mg/dL (ref 8.4–10.5)
Creatinine, Ser: 0.7 mg/dL (ref 0.4–1.5)
GFR calc Af Amer: >60 mL/min (ref >60)
GFR calc non Af Amer: >60 mL/min (ref >60)
Glucose, Bld: 142 mg/dL — ABNORMAL HIGH (ref 70–99)

## 2010-11-27 NOTE — Assessment & Plan Note (Signed)
The blood pressure is at target. No change in medications is indicated. We will continue with therapeutic lifestyle changes (TLC).  

## 2010-11-27 NOTE — Progress Notes (Signed)
HPI The patient presents for yearly followup. He also happens to be scheduled or surgery again on his foot. 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 This has limited his activities and for allowing me was in a wheelchair with orthopedic problems. He is up and moving now but not as active as he was previously. However, when walking or other activities he is not getting any chest pressure, neck or arm discomfort. He has none of the prior cardiovascular symptoms. He has chronic dyspnea and uses 2 L of O2 continuously. However, he is not describing PND or orthopnea. He has had no palpitations, presyncope or syncope. His had less edema than he's had previously.  Allergies  Allergen Reactions  . Morphine Sulfate     REACTION: unspecified    Current Outpatient Prescriptions  Medication Sig Dispense Refill  . albuterol (PROVENTIL) (2.5 MG/3ML) 0.083% nebulizer solution 2.5 mg.        . albuterol (PROVENTIL,VENTOLIN) 90 MCG/ACT inhaler Inhale 2 puffs into the lungs every 6 (six) hours as needed.        Marland Kitchen alendronate (FOSAMAX) 70 MG tablet Take 70 mg by mouth every 7 (seven) days. Take with a full glass of water on an empty stomach.       . ALPRAZolam (XANAX) 0.5 MG tablet Take 0.5 mg by mouth at bedtime as needed.        Marland Kitchen amitriptyline (ELAVIL) 75 MG tablet Take 150 mg by mouth at bedtime.        . Ascorbic Acid (VITAMIN C) 100 MG tablet Take 100 mg by mouth daily.        Marland Kitchen aspirin 81 MG tablet Take 81 mg by mouth daily.        Marland Kitchen atorvastatin (LIPITOR) 80 MG tablet Take 80 mg by mouth at bedtime.        . calamine ([employer] CALAMINE) lotion        . Carbonyl Iron (PERFECT IRON PO) Take by mouth. 1 tab daily       . Cyanocobalamin (B-12) 500 MCG TABS Take by mouth daily.        . Fluticasone-Salmeterol (ADVAIR DISKUS) 100-50 MCG/DOSE AEPB 1 puff and rinse twice daily       . folic acid (FOLVITE) 400 MCG tablet 1200 mcg daily        . methotrexate (RHEUMATREX) 2.5 MG  tablet Take 8 tablets every week       . metoprolol tartrate (LOPRESSOR) 25 MG tablet Take 25 mg by mouth 2 (two) times daily.        Marland Kitchen morphine (MSIR) 15 MG tablet Take by mouth. By mouth 4 times daily       . omeprazole (PRILOSEC) 20 MG capsule Take 20 mg by mouth 2 (two) times daily.        Marland Kitchen oxyCODONE-acetaminophen (PERCOCET) 7.5-325 MG per tablet Take 1 tablet by mouth. 4 times a day        . potassium chloride SA (K-DUR,KLOR-CON) 20 MEQ tablet 2 (two) times daily. 2 by mouth in the morning and 1 by mouth at 4 pm       . predniSONE (DELTASONE) 10 MG tablet pack 5 mg. Take five and a half mg daily       . pregabalin (LYRICA) 150 MG capsule Take 150 mg by mouth 3 (three) times daily.        Marland Kitchen tiotropium (SPIRIVA HANDIHALER) 18 MCG inhalation capsule Place 18 mcg into  inhaler and inhale daily.          Past Medical History  Diagnosis Date  . Rheumatoid arthritis   . Psoriasis   . Hypertension   . Gastroesophageal reflux disease   . COPD (chronic obstructive pulmonary disease)   . CAD (coronary artery disease)     MI 2007, CABG  . Osteoporosis     Past Surgical History  Procedure Date  . Foot surgery   . Cholecystectomy   . Coronary artery bypass graft     ROS As stated in the HPI and negative for all other systems.   PHYSICAL EXAM BP 113/45  Pulse 73  Ht 6\' 1"  (1.854 m)  Wt 244 lb (110.678 kg)  BMI 32.19 kg/m2 General:  Well developed, well nourished, in no acute distress. Head:  normocephalic and atraumatic Eyes:  PERRLA/EOM intact; conjunctiva and lids normal. Mouth:  Teeth, gums and palate normal. Oral mucosa normal. Neck:  Neck supple, no JVD. No masses, thyromegaly or abnormal cervical nodes. Chest Wall:  well-healed sternotomy scar Lungs:  Clear bilaterally to auscultation and percussion. Heart:  S1 and S2 within normal limits, no S3, no S4, no clicks, no rubs, no murmurs Abdomen:  Bowel sounds positive; abdomen soft and non-tender without masses,  organomegaly, or hernias noted. No hepatosplenomegaly, obese Msk:  severe rheumatoid changes Extremities:  trace bilateral edema, his legs are in diabetic boots and I was unable to appreciate posterior tibialis or dorsalis pulses Neurologic:  Alert and oriented x 3. Skin:  Intact without lesions or rashes. Psych:  Normal affect.  EKG:  Sinus rhythm, rate 74, axis within normal limits, intervals within normal limits, no acute ST-T wave changes.  ASSESSMENT AND PLAN

## 2010-11-27 NOTE — Assessment & Plan Note (Signed)
The patient has a low functional level to some degree recently. It has been 5 years since his bypass. He is due for surgery and according to ACC/AHA guidelines stress testing preoperatively and is indicated.  He cannot walk a treadmill and so he will have pharmacologic testing.  Preoperative clearance will be based on this.

## 2010-11-27 NOTE — Assessment & Plan Note (Signed)
His last lipid profile was August of last year with an LDL of 68 and HDL of 39. I will check a fasting lipid profile when he returns. For now he is at target and will continue on the meds as listed.

## 2010-11-27 NOTE — Assessment & Plan Note (Signed)
He is now 5 years since his bypass. We are pursuing primary risk reduction. He will have stress testing as above.

## 2010-11-27 NOTE — Patient Instructions (Addendum)
You are scheduled for a lexiscan for pre-op clearance You will need to follow up with Tereso Newcomer, PA after your stress test for final clearance Please have fasting lipid profile same day as your stress test Follow up with Dr Antoine Poche

## 2010-11-29 ENCOUNTER — Other Ambulatory Visit: Payer: Self-pay

## 2010-11-29 MED ORDER — FUROSEMIDE 20 MG PO TABS: 20.0000 mg | ORAL_TABLET | Freq: Two times a day (BID) | ORAL | Status: DC

## 2010-12-03 ENCOUNTER — Ambulatory Visit (HOSPITAL_COMMUNITY): Payer: BC Managed Care – PPO | Attending: Cardiology | Admitting: Radiology

## 2010-12-03 ENCOUNTER — Other Ambulatory Visit (INDEPENDENT_AMBULATORY_CARE_PROVIDER_SITE_OTHER): Payer: BC Managed Care – PPO | Admitting: *Deleted

## 2010-12-03 DIAGNOSIS — I252 Old myocardial infarction: Secondary | ICD-10-CM

## 2010-12-03 DIAGNOSIS — R0989 Other specified symptoms and signs involving the circulatory and respiratory systems: Secondary | ICD-10-CM

## 2010-12-03 DIAGNOSIS — I25810 Atherosclerosis of coronary artery bypass graft(s) without angina pectoris: Secondary | ICD-10-CM

## 2010-12-03 DIAGNOSIS — E7800 Pure hypercholesterolemia, unspecified: Secondary | ICD-10-CM

## 2010-12-03 DIAGNOSIS — E785 Hyperlipidemia, unspecified: Secondary | ICD-10-CM

## 2010-12-03 DIAGNOSIS — Z01810 Encounter for preprocedural cardiovascular examination: Secondary | ICD-10-CM

## 2010-12-03 DIAGNOSIS — Z01818 Encounter for other preprocedural examination: Secondary | ICD-10-CM

## 2010-12-03 DIAGNOSIS — I2510 Atherosclerotic heart disease of native coronary artery without angina pectoris: Secondary | ICD-10-CM | POA: Insufficient documentation

## 2010-12-03 DIAGNOSIS — R0609 Other forms of dyspnea: Secondary | ICD-10-CM

## 2010-12-03 DIAGNOSIS — I10 Essential (primary) hypertension: Secondary | ICD-10-CM | POA: Insufficient documentation

## 2010-12-03 LAB — LIPID PANEL: VLDL: 13.4 mg/dL (ref 0.0–40.0)

## 2010-12-03 MED ORDER — TECHNETIUM TC 99M TETROFOSMIN IV KIT
33.0000 | PACK | Freq: Once | INTRAVENOUS | Status: AC | PRN
  Administered 2010-12-03: 33 via INTRAVENOUS

## 2010-12-03 MED ORDER — TECHNETIUM TC 99M TETROFOSMIN IV KIT
11.0000 | PACK | Freq: Once | INTRAVENOUS | Status: AC | PRN
  Administered 2010-12-03: 11 via INTRAVENOUS

## 2010-12-03 MED ORDER — REGADENOSON 0.4 MG/5ML IV SOLN
0.4000 mg | Freq: Once | INTRAVENOUS | Status: AC
  Administered 2010-12-03: 0.4 mg via INTRAVENOUS

## 2010-12-03 NOTE — Progress Notes (Signed)
[facility] NUCLEAR MED [address] [place] [address] [phone]  Cardiology Nuclear Med Study  Joseph [name] is a 60 y.o. male [id number] 1951-01-29   Nuclear Med Background Indication for Stress Test:  Evaluation for Ischemia, Surgical Clearance Pending foot surgery on 12/10/10: Dr. Leanna Battles, and Graft Patency History:  '07 MI>Cath: Severe 2 vessel disease> CABG x4;'09 Echo: EF:55-60%; COPD/Emphysema with continuous O2 at 2L. Cardiac Risk Factors: Family History - CAD, History of Smoking, Hypertension, Lipids and NIDDM  Symptoms:  DOE, Fatigue with Exertion and SOB   Nuclear Pre-Procedure Caffeine/Decaff Intake:  None NPO After: 9:00pm   Lungs:  Clear IV 0.9% NS with Angio Cath:  20g  IV Site: R Antecubital  IV Started by:  Stanton Kidney, EMT-P  Chest Size (in):  46 Cup Size: n/a  Height: 6\' 1"  (1.854 m)  Weight:  242 lb (109.77 kg)  BMI:  Body mass index is 31.93 kg/(m^2). Tech Comments:  NA    Nuclear Med Study 1 or 2 day study: 1 day  Stress Test Type:  Lexiscan  Reading MD: Cassell Clement, MD  Order Authorizing Provider:  Dr. Rollene Rotunda, MD  Resting Radionuclide: Technetium 46m Tetrofosmin  Resting Radionuclide Dose: 11 mCi   Stress Radionuclide:  Technetium 66m Tetrofosmin  Stress Radionuclide Dose: 33 mCi           Stress Protocol Rest HR: 81 Stress HR: 98  Rest BP: 131/82 Stress BP: 109/58  Exercise Time (min): n/a METS: n/a   Predicted Max HR: 162 bpm % Max HR: 60.49 bpm Rate Pressure Product: 21308   Dose of Adenosine (mg):  n/a Dose of Lexiscan: 0.4 mg  Dose of Atropine (mg): n/a Dose of Dobutamine: n/a mcg/kg/min (at max HR)  Stress Test Technologist: Irean Hong, RN  Nuclear Technologist:  Doyne Keel, CNMT     Rest Procedure:  Myocardial perfusion imaging was performed at rest 45 minutes following the intravenous administration of Technetium 74m Tetrofosmin. Rest ECG: NSR with nonspecific T wave  changes  Stress Procedure:  The patient received IV Lexiscan 0.4 mg over 15-seconds.  Technetium 39m Tetrofosmin injected at 30-seconds.  There were no significant changes with Lexiscan. Quantitative spect images were obtained after a 45 minute delay. Stress ECG: No significant ST segment change suggestive of ischemia.  QPS Raw Data Images:  Normal; no motion artifact; normal heart/lung ratio. Stress Images:  There is decreased uptake in the anterior wall. Rest Images:  There is decreased uptake in the anterior wall with partial reversibility. Subtraction (SDS):  These findings are consistent with ischemia. Transient Ischemic Dilatation (Normal <1.22):  1.12 Lung/Heart Ratio (Normal <0.45):  0.37  Quantitative Gated Spect Images QGS EDV:  98 ml QGS ESV:  39 ml QGS cine images:  NL LV Function; NL Wall Motion QGS EF: 60%  Impression Exercise Capacity:  Lexiscan with no exercise. BP Response:  Hypotensive blood pressure response. Clinical Symptoms:  No chest pain. ECG Impression:  No significant ST segment change suggestive of ischemia. Comparison with Prior Nuclear Study: No images to compare  Overall Impression:  Abnormal stress nuclear study.  Partially reversible anteroapical ischemia.  Normal LV systolic function.

## 2010-12-04 ENCOUNTER — Encounter: Payer: Self-pay | Admitting: Physician Assistant

## 2010-12-04 ENCOUNTER — Encounter (HOSPITAL_COMMUNITY): Payer: Self-pay | Admitting: Cardiology

## 2010-12-04 ENCOUNTER — Ambulatory Visit (INDEPENDENT_AMBULATORY_CARE_PROVIDER_SITE_OTHER): Payer: BC Managed Care – PPO | Admitting: Physician Assistant

## 2010-12-04 VITALS — BP 122/72 | HR 76 | Resp 20 | Ht 73.0 in | Wt 242.0 lb

## 2010-12-04 DIAGNOSIS — R931 Abnormal findings on diagnostic imaging of heart and coronary circulation: Secondary | ICD-10-CM

## 2010-12-04 DIAGNOSIS — R9439 Abnormal result of other cardiovascular function study: Secondary | ICD-10-CM

## 2010-12-04 DIAGNOSIS — M069 Rheumatoid arthritis, unspecified: Secondary | ICD-10-CM

## 2010-12-04 DIAGNOSIS — I2510 Atherosclerotic heart disease of native coronary artery without angina pectoris: Secondary | ICD-10-CM

## 2010-12-04 MED ORDER — FUROSEMIDE 20 MG PO TABS: ORAL_TABLET | ORAL | Status: DC

## 2010-12-04 MED ORDER — POTASSIUM CHLORIDE CRYS ER 20 MEQ PO TBCR: 20.0000 meq | EXTENDED_RELEASE_TABLET | Freq: Two times a day (BID) | ORAL | Status: DC

## 2010-12-04 MED ORDER — METOPROLOL TARTRATE 25 MG PO TABS: 25.0000 mg | ORAL_TABLET | Freq: Two times a day (BID) | ORAL | Status: DC

## 2010-12-04 NOTE — Assessment & Plan Note (Signed)
Severe deformity in right toe leading to ulcer formation needs corrective surgery.

## 2010-12-04 NOTE — Patient Instructions (Signed)
Your physician recommends that you schedule a follow-up appointment in: 12/10/10 @ 9:30 WITH DR. HOCHREIN AS PER DR. Riley Kill , PT TO SEE DR. HOCHREIN ASAP.  Your physician recommends that you continue on your current medications as directed. Please refer to the Current Medication list given to you today.

## 2010-12-04 NOTE — Assessment & Plan Note (Addendum)
Will hold off on clearing him for surgery for now until he can see Dr. Antoine Poche in follow up.

## 2010-12-04 NOTE — Progress Notes (Signed)
ROUTED TO DR.HOCHREIN.Falecha Clark °

## 2010-12-04 NOTE — Progress Notes (Signed)
History of Present Illness: Primary Cardiologist:  Dr. Rollene Rotunda  Joseph [name] is a 60 y.o. male with a h/o CAD, s/p CABG in 2007 who was seen recently for surgical clearance.  He had a myoview yesterday and returns today.  His grafts in 2007 included left internal mammary artery to LAD, left radial artery to obtuse marginal-1, saphenous vein graft to first diagonal, saphenous vein graft to left posterior descending.  Echo in 2009 demonstrated normal LVF.  His nuclear images from yesterday demonstrate partially reversible anteroapical ischemia.  He denies any chest pain.  He has chronic dyspnea and wears O2.  He does not feel it is changing.  He sleeps on an incline due to GERD.  He denies syncope.  He denies significant edema.  He has a hammer toe on his right foot that has contributed to a chronic ulcer.  He is on antibiotics.  He denies any current fever.  The plan is to have part of his toe removed to relieve the pressure on it and improve mechanics.  Past Medical History  Diagnosis Date  . Rheumatoid arthritis   . Psoriasis   . Hypertension   . Gastroesophageal reflux disease   . COPD (chronic obstructive pulmonary disease)   . CAD (coronary artery disease)     MI 2007, CABG  . Osteoporosis     Past Surgical History  Procedure Date  . Foot surgery   . Cholecystectomy   . Coronary artery bypass graft 2007    Current Outpatient Prescriptions  Medication Sig Dispense Refill  . albuterol (PROVENTIL) (2.5 MG/3ML) 0.083% nebulizer solution 2.5 mg.        . albuterol (PROVENTIL,VENTOLIN) 90 MCG/ACT inhaler Inhale 2 puffs into the lungs every 6 (six) hours as needed.        Marland Kitchen alendronate (FOSAMAX) 70 MG tablet Take 70 mg by mouth every 7 (seven) days. Take with a full glass of water on an empty stomach.       Marland Kitchen amitriptyline (ELAVIL) 75 MG tablet Take 150 mg by mouth at bedtime.        . Ascorbic Acid (VITAMIN C) 100 MG tablet Take 100 mg by mouth daily.        Marland Kitchen aspirin 81 MG  tablet Take 81 mg by mouth daily.        Marland Kitchen atorvastatin (LIPITOR) 80 MG tablet Take 80 mg by mouth at bedtime.        . calamine ([employer] CALAMINE) lotion        . Carbonyl Iron (PERFECT IRON PO) Take by mouth. 1 tab daily       . cephALEXin (KEFLEX) 500 MG capsule Take 500 mg by mouth 4 (four) times daily. Until 12-06-10       . Cyanocobalamin (B-12) 500 MCG TABS Take by mouth daily.        . Fluticasone-Salmeterol (ADVAIR DISKUS) 100-50 MCG/DOSE AEPB 1 puff and rinse twice daily       . folic acid (FOLVITE) 400 MCG tablet 1200 mcg daily        . furosemide (LASIX) 20 MG tablet Take 20 mg by mouth. Take 2 tablets in AM and 1 tablet PM       . methotrexate (RHEUMATREX) 2.5 MG tablet Take 8 tablets every week       . metoprolol tartrate (LOPRESSOR) 25 MG tablet Take 25 mg by mouth 2 (two) times daily.        Marland Kitchen morphine (MSIR) 15 MG tablet  Take 15 mg by mouth. Take 1 tablet every 4 hours      . NON FORMULARY oxigen 2 litters 24 hours a day       . omeprazole (PRILOSEC) 20 MG capsule Take 20 mg by mouth daily.       Marland Kitchen oxyCODONE-acetaminophen (PERCOCET) 7.5-325 MG per tablet Take 1 tablet by mouth. 3 times a day       . potassium chloride SA (K-DUR,KLOR-CON) 20 MEQ tablet 2 (two) times daily. 2 by mouth in the morning and 1 by mouth at 4 pm       . predniSONE (DELTASONE) 10 MG tablet pack 5 mg. Take five and a half mg daily       . pregabalin (LYRICA) 150 MG capsule Take 150 mg by mouth 3 (three) times daily.        Marland Kitchen tiotropium (SPIRIVA HANDIHALER) 18 MCG inhalation capsule Place 18 mcg into inhaler and inhale daily.        Marland Kitchen DISCONTD: furosemide (LASIX) 20 MG tablet Take 1 tablet (20 mg total) by mouth 2 (two) times daily.  60 tablet  11  . ALPRAZolam (XANAX) 0.5 MG tablet Take 0.5 mg by mouth at bedtime as needed.          No Known Allergies  History  Substance Use Topics  . Smoking status: Former Games developer  . Smokeless tobacco: Former Neurosurgeon    Quit date: 04/02/2006  . Alcohol Use: No     ROS:  Please see HPI.  No fevers, chills, melena, hematochezia.  All other systems reviewed and negative.  Vital Signs: BP 122/72  Pulse 76  Resp 20  Ht 6\' 1"  (1.854 m)  Wt 242 lb (109.77 kg)  BMI 31.93 kg/m2  PHYSICAL EXAM: Well nourished, well developed, in no acute distress HEENT: normal Neck: no JVD Cardiac:  normal S1, S2; RRR; no murmur Vascular: no FA bruits noted bilaterally Lungs:  clear to auscultation bilaterally, no wheezing, rhonchi or rales Abd: soft, nontender, no hepatomegaly Ext: trace bilateral edema MSK: severe rheumatoid deformities noted bilateral hands and feet Skin: warm and dry Neuro:  CNs 2-12 intact, no focal abnormalities noted Psych: normal affect  EKG:  NSR, HR 76, normal axis, NSSTTW changes.  Lexiscan Myoview 12/03/10:  Impression  Exercise Capacity: Lexiscan with no exercise.  BP Response: Hypotensive blood pressure response.  Clinical Symptoms: No chest pain.  ECG Impression: No significant ST segment change suggestive of ischemia.  Comparison with Prior Nuclear Study: No images to compare  Overall Impression: Abnormal stress nuclear study. Partially reversible anteroapical ischemia. Normal LV systolic function.   ASSESSMENT AND PLAN:

## 2010-12-04 NOTE — Assessment & Plan Note (Addendum)
Abnormal stress test.  No unstable symptoms.  Dr. Riley Kill also saw the patient.  Normally, would recommend cardiac cath.  Although, he has a low risk surgery scheduled.  His anatomy prior to his CABG included an occluded D1 and D2.  His D1 was grafted  And the D2 was not.  His nuclear images may match with his known anatomy.  After extensive consultation between the patient and Dr. Riley Kill, we have decided to bring the patient in on Monday to see Dr. Antoine Poche to discuss this further and make a final decision regarding +/- cardiac cath.

## 2010-12-05 ENCOUNTER — Telehealth: Payer: Self-pay | Admitting: *Deleted

## 2010-12-05 ENCOUNTER — Telehealth: Payer: Self-pay | Admitting: Physician Assistant

## 2010-12-05 NOTE — Telephone Encounter (Signed)
[facility]- needs surgical clearance from you as well as Cardiology. They would like for you to fax them a letter stating that he is cleared for Surgery. Please advise.

## 2010-12-05 NOTE — Telephone Encounter (Signed)
12 lead faxed to Kathy/[facility] @ [phone] 12/05/10/KM

## 2010-12-06 ENCOUNTER — Encounter: Payer: Self-pay | Admitting: Cardiology

## 2010-12-10 ENCOUNTER — Other Ambulatory Visit: Payer: BC Managed Care – PPO | Admitting: *Deleted

## 2010-12-10 ENCOUNTER — Encounter: Payer: Self-pay | Admitting: Cardiology

## 2010-12-10 ENCOUNTER — Ambulatory Visit (INDEPENDENT_AMBULATORY_CARE_PROVIDER_SITE_OTHER): Payer: BC Managed Care – PPO | Admitting: Cardiology

## 2010-12-10 DIAGNOSIS — R931 Abnormal findings on diagnostic imaging of heart and coronary circulation: Secondary | ICD-10-CM

## 2010-12-10 DIAGNOSIS — I10 Essential (primary) hypertension: Secondary | ICD-10-CM

## 2010-12-10 DIAGNOSIS — Z01818 Encounter for other preprocedural examination: Secondary | ICD-10-CM

## 2010-12-10 DIAGNOSIS — I6529 Occlusion and stenosis of unspecified carotid artery: Secondary | ICD-10-CM

## 2010-12-10 DIAGNOSIS — I2510 Atherosclerotic heart disease of native coronary artery without angina pectoris: Secondary | ICD-10-CM

## 2010-12-10 DIAGNOSIS — E785 Hyperlipidemia, unspecified: Secondary | ICD-10-CM

## 2010-12-10 DIAGNOSIS — R9439 Abnormal result of other cardiovascular function study: Secondary | ICD-10-CM

## 2010-12-10 NOTE — Progress Notes (Signed)
HPI The patient presents for an abnormal stress perfusion study. He had a history of coronary disease with bypass in 2007. He is to have foot surgery. I sent him for a stress perfusion study which demonstrated some apical ischemia. However, his EF is well-preserved. I reviewed his catheterization report and bypass surgery description from 2007. He has some diffuse disease with second diagonal occlusion that was not bypassed. He has not had any other cardiovascular symptoms that was his previous angina. He denies any palpitations, presyncope or syncope. He has had no shortness of breath or other symptoms that he was having preoperatively. He has had no neck, arm or chest discomfort.  No Known Allergies  Current Outpatient Prescriptions  Medication Sig Dispense Refill  . albuterol (PROVENTIL HFA;VENTOLIN HFA) 108 (90 BASE) MCG/ACT inhaler Inhale into the lungs every 6 (six) hours as needed.        Marland Kitchen albuterol (PROVENTIL) (2.5 MG/3ML) 0.083% nebulizer solution 2.5 mg.        . alendronate (FOSAMAX) 70 MG tablet Take 70 mg by mouth every 7 (seven) days. Take with a full glass of water on an empty stomach.       Marland Kitchen amitriptyline (ELAVIL) 75 MG tablet Take 150 mg by mouth at bedtime.        . Ascorbic Acid (VITAMIN C) 500 MG tablet Take 500 mg by mouth daily.        Marland Kitchen aspirin 81 MG tablet Take 81 mg by mouth daily.        Marland Kitchen atorvastatin (LIPITOR) 80 MG tablet Take 80 mg by mouth at bedtime.        . calamine ([employer] CALAMINE) lotion        . Carbonyl Iron (PERFECT IRON PO) Take by mouth. 1 tab daily       . cephALEXin (KEFLEX) 500 MG capsule Take 500 mg by mouth 4 (four) times daily. Until 12-06-10       . Fluticasone-Salmeterol (ADVAIR DISKUS) 100-50 MCG/DOSE AEPB 1 puff and rinse twice daily      . folic acid (FOLVITE) 400 MCG tablet 2 (two) times daily.       . furosemide (LASIX) 20 MG tablet Take 2 tablets in AM and 1 tablet PM  270 tablet  3  . methotrexate (RHEUMATREX) 2.5 MG tablet Take 8 tablets  every week       . metoprolol tartrate (LOPRESSOR) 25 MG tablet Take 1 tablet (25 mg total) by mouth 2 (two) times daily.  180 tablet  3  . morphine (MSIR) 15 MG tablet Take 15 mg by mouth. Take 3  tablet every 12  hours      . NON FORMULARY oxigen 2 litters 24 hours a day       . omeprazole (PRILOSEC) 20 MG capsule Take 20 mg by mouth daily.       Marland Kitchen oxyCODONE-acetaminophen (PERCOCET) 7.5-325 MG per tablet Take 1 tablet by mouth. 3 times a day       . potassium chloride SA (K-DUR,KLOR-CON) 20 MEQ tablet Take 1 tablet (20 mEq total) by mouth 2 (two) times daily. 2 by mouth in the morning and 1 by mouth at 4 pm  270 tablet  3  . predniSONE (DELTASONE) 10 MG tablet pack 5 mg. Take 5mg  one day and the next day take 5.5mg       . pregabalin (LYRICA) 150 MG capsule Take 150 mg by mouth 3 (three) times daily.        Marland Kitchen  tiotropium (SPIRIVA HANDIHALER) 18 MCG inhalation capsule Place 18 mcg into inhaler and inhale daily. 1 puff in AM      . vitamin B-12 (CYANOCOBALAMIN) 250 MCG tablet Take 250 mcg by mouth daily.        Marland Kitchen DISCONTD: Ascorbic Acid (VITAMIN C) 100 MG tablet Take 100 mg by mouth daily.        Marland Kitchen DISCONTD: Cyanocobalamin (B-12) 500 MCG TABS Take by mouth daily.        Marland Kitchen albuterol (PROVENTIL,VENTOLIN) 90 MCG/ACT inhaler Inhale 2 puffs into the lungs every 6 (six) hours as needed.        Marland Kitchen DISCONTD: ALPRAZolam (XANAX) 0.5 MG tablet Take 0.5 mg by mouth at bedtime as needed.          Past Medical History  Diagnosis Date  . Rheumatoid arthritis   . Psoriasis   . Hypertension   . Gastroesophageal reflux disease   . COPD (chronic obstructive pulmonary disease)   . CAD (coronary artery disease)     MI 2007, CABG  . Osteoporosis   . Peripheral polyneuropathy     Past Surgical History  Procedure Date  . Foot surgery   . Cholecystectomy   . Coronary artery bypass graft 2007   ROS  As stated in the HPI and negative for all other systems.   PHYSICAL EXAM General: Well developed, well  nourished, in no acute distress.  Head: normocephalic and atraumatic  Eyes: PERRLA/EOM intact; conjunctiva and lids normal.  Mouth: Teeth, gums and palate normal. Oral mucosa normal.  Neck: Neck supple, no JVD. No masses, thyromegaly or abnormal cervical nodes.  Chest Wall: well-healed sternotomy scar  Lungs: Clear bilaterally to auscultation and percussion.  Heart: S1 and S2 within normal limits, no S3, no S4, no clicks, no rubs, no murmurs  Abdomen: Bowel sounds positive; abdomen soft and non-tender without masses, organomegaly, or hernias noted. No hepatosplenomegaly, obese  Msk: severe rheumatoid changes  Extremities: trace bilateral edema, his legs are in diabetic boots and I was unable to appreciate posterior tibialis or dorsalis pulses  Neurologic: Alert and oriented x 3.  Skin: Intact without lesions or rashes.  Psych: Normal affect.  EKG:  Sinus rhythm, rate 74, axis within normal limits, intervals within normal limits, no acute ST-T wave changes.  ASSESSMENT AND PLAN

## 2010-12-10 NOTE — Assessment & Plan Note (Signed)
His blood pressure is controlled and he will continue meds as listed. 

## 2010-12-10 NOTE — Assessment & Plan Note (Signed)
He had mild carotid plaquing 2007 and he will have a repeat Dopplers.

## 2010-12-10 NOTE — Assessment & Plan Note (Signed)
I review his lipids from April 9. HDL was 36, triglycerides 67 and LDL 71. He will continue meds as listed.

## 2010-12-10 NOTE — Patient Instructions (Signed)
Your physician recommends that you continue on your current medications as directed. Please refer to the Current Medication list given to you today.  Your physician wants you to follow-up in: 6 MONTHS. You will receive a reminder letter in the mail two months in advance. If you don't receive a letter, please call our office to schedule the follow-up appointment.  Your physician has requested that you have a carotid duplex. This test is an ultrasound of the carotid arteries in your neck. It looks at blood flow through these arteries that supply the brain with blood. Allow one hour for this exam. There are no restrictions or special instructions.  You are cleared from a cardiac standpoint for surgery.

## 2010-12-10 NOTE — Assessment & Plan Note (Signed)
As above.

## 2010-12-10 NOTE — Assessment & Plan Note (Signed)
I have reviewed carefully previous catheter and bypass notes. I have reviewed carefully the stress perfusion results and his lack of symptoms. According to ACC/AHA guidelines the patient is an acceptable risk for surgery without further testing. He will continue with the medications as listed.

## 2010-12-12 ENCOUNTER — Encounter: Payer: Self-pay | Admitting: *Deleted

## 2010-12-14 ENCOUNTER — Encounter: Payer: BC Managed Care – PPO | Admitting: *Deleted

## 2010-12-14 ENCOUNTER — Encounter (INDEPENDENT_AMBULATORY_CARE_PROVIDER_SITE_OTHER): Payer: BC Managed Care – PPO | Admitting: *Deleted

## 2010-12-14 DIAGNOSIS — I6529 Occlusion and stenosis of unspecified carotid artery: Secondary | ICD-10-CM

## 2010-12-14 DIAGNOSIS — I2510 Atherosclerotic heart disease of native coronary artery without angina pectoris: Secondary | ICD-10-CM

## 2010-12-17 ENCOUNTER — Encounter: Payer: Self-pay | Admitting: Cardiology

## 2010-12-18 NOTE — Progress Notes (Signed)
Pt aware of results 

## 2010-12-24 NOTE — Telephone Encounter (Signed)
Spoke with the patient, he had his surgery few days ago and doing well. Remind him to have a physical with me in about 3 months from today

## 2011-01-08 NOTE — H&P (Signed)
Joseph [name], Joseph [name]               ACCOUNT NO.:  [id number]   MEDICAL RECORD NO.:  [id number]          PATIENT TYPE:  INP   LOCATION:  4740                         FACILITY:  [facility]   PHYSICIAN:  Sean A. Everardo All, MD    DATE OF BIRTH:  Jun 08, 1951   DATE OF ADMISSION:  05/22/2008  DATE OF DISCHARGE:                              HISTORY & PHYSICAL   REASON FOR ADMISSION:  Hypotension.   HISTORY OF PRESENT ILLNESS:  This is a 60 year old man who states 1 day  of moderate fever and chills.  He has no associated pain at his left  ankle or foot.  He had surgery on his left foot last month, and he had  persisted edema following the surgery.  He had a negative venous Doppler  to evaluate the edema and he states the edema really has not changed  much recently.  He was seen in the emergency department and found to  have hypotension and was transferred here.   PAST MEDICAL HISTORY:  1. Rheumatoid arthritis.  2. Psoriasis.  3. 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  4. Hypertension.  5. GERD.  6. COPD.  7. CAD with an MI in 2007, and CABG.  8. Gastritis.   SOCIAL HISTORY:  He is married.  He is disabled because of his  arthritis.  He does not drink or smoke.   FAMILY HISTORY:  No one else at home has been ill recently.   REVIEW OF SYSTEMS:  Denies the following; nausea, vomiting, diarrhea,  chest pain, abdominal pain, sore throat, earache, syncope, weight  change, rectal bleeding, hematuria, shortness of breath, dysuria, and  skin rash.  He has slight hoarseness and a dry cough for the last day or  so, but describes these symptoms as minor.  He has no change in what he  says is chronically excessive diaphoresis.   MEDICATIONS:  1. Methotrexate 15 mg weekly.  2. Folate 400 mcg daily.  3. Prednisone 9.5 mg daily.  4. Lyrica 150 mg 3 times a day.  5. Prilosec 20 mg a day.  6. Multivitamin 1 daily.  7. Vitamin C 100 mg daily.  8. Albuterol 2 puffs every 4  hours as needed for shortness of breath.  9. Lopressor 25 mg twice a day.  10.Lasix 40 mg every morning and 20 mg every afternoon.  11.Elavil 150 mg at bedtime.  12.Xanax 0.5 mg at bedtime as needed for sleep.  13.Potassium chloride 20 mEq 3 times a day.  14.Methadone 25 mg daily.  15.Lipitor 80 mg at bedtime.  16.Aspirin 81 mg daily.  17.Iron supplement, nonprescription, 1 daily.  18.Spiriva 18 mcg inhaled once daily.  19.Oxygen 2 L/min.  20.Advair 100/50, one puff twice daily.   PHYSICAL EXAMINATION:  VITAL SIGNS:  Noted in the emergency department  at [facility], blood pressure 91/50, heart  rate 95, respiratory rate 20, and temperature 98.9.  Rest of physical  examination was done on his arrival to [facility] at  approximately 6 p.m.  GENERAL:  No distress.  SKIN:  No  rash, not diaphoretic.  He has a few psoriatic plaques on the  skin, most notably at the extensor aspect of the left knee.  HEENT:  No periorbital swelling.  No scleral icterus.  No proptosis.  Pharynx, mucous membranes are moist.  There is no erythema.  NECK:  Supple.  CHEST:  Clear to auscultation.  No respiratory distress.  CARDIOVASCULAR:  No edema on the right.  There is 1+ left leg edema.  Regular rate and rhythm.  No murmur.  Pedal pulses are intact, but  slightly decreased on the left, possibly due to his edema.  ABDOMEN:  Soft, slightly obese, nontender.  No hepatosplenomegaly, no  mass, not distended.  GENITAL/RECTAL:  Examination not done at this time due to the patient's  condition.  EXTREMITIES:  He has slight swelling on his left foot and a healed  surgical scar.  There is no erythema, no tenderness, no warmth, and no  rash.  NEUROLOGIC:  Alert and well-oriented.  Does not appear anxious nor  depressed.  Cranial nerves appear to be intact.  He readily moves all  fours.  Sensation is intact to touch, except it is slightly decreased  from normal on the feet.    LABORATORY STUDIES:  CBC is normal except for a white count of 15,000.   IMPRESSION:  1. Episode of hypotension.  In view of the below, sepsis should be      excluded.  2. Fever, as reported by the patient.  Again, in view of hypotension,      sepsis is a primary concern.  3. Elevated WBC, could also be accounted by his prednisone.  It should      also be noted that he may not be a normal host in view of his      methotrexate.  4. Edema on his left leg.  He has already been checked for deep venous      thrombosis.  No evidence of any problem at his left leg or foot to      indicate this may be a source of infection.   PLAN:  1. Intravenous fluid.  2. Antibiotics.  3. Cardiac enzymes.  4. Hold Lasix for now.  5. He does not need another venous Doppler in my opinion as his edema      is the same or perhaps slightly improved.  6. If no other source of this episode becomes evident, he should have      his left foot evaluated radiologically (CT versus MRI, depending on      if he had any hardware there), to see if this could be the source      of infection.  7. I discussed the code status with the patient.  He states he wants      to be full code, but would not want to be started nor maintained on      artificial life support measures if there was not a reasonable      chance of a functional recovery.      Sean A. Everardo All, MD  Electronically Signed     SAE/MEDQ  D:  05/22/2008  T:  05/23/2008  Job:  [id number]

## 2011-01-08 NOTE — Assessment & Plan Note (Signed)
[facility]                            CARDIOLOGY OFFICE NOTE   CHEVY, SWEIGERT                      MRN:          [id number]  DATE:09/24/2007                            DOB:          1950/09/08    REASON FOR PRESENTATION:  Evaluate patient with dyspnea.   HISTORY OF PRESENT ILLNESS:  A patient is a 60 year old gentleman with a  recent hospitalization for dyspnea.  This was felt to be related in some  part to his chronic lung disease.  He also was felt to have perhaps mild  diastolic heart failure.  He was treated with IV diuresis and improved.  Chest CT at that time demonstrated bullous emphysema.  There were also  some nonspecific small nodules that required follow-up.   Since discharge the patient has had pulmonary function testing  suggesting some mild obstructive disease with a response to  bronchodilators.  His diffusion capacity was moderately to severely  reduced.   Since discharge the patient has had some mild cough.  His breathing has  been better.  He has not been having any resting shortness of breath, no  PND or orthopnea.  He has had some mild lower extremity swelling.  He  denies any chest discomfort, neck or arm discomfort.  He has had no  palpitations, presyncope or syncope.   PAST MEDICAL HISTORY:  1. Coronary artery disease, status post CABG:  LIMA to the LAD, left      radial to obtuse marginal, SVG to first diagonal and SVG to PDA.      His ejection fraction was 50-55%).  2. Severe rheumatoid arthritis.  3. Psoriasis.  4. Polyneuropathy.  5. Emphysema.  6. Previous tobacco abuse (has not smoked cigarettes since his      surgery).  7. Previous renal insufficiency.   ALLERGIES:  None.   MEDICATIONS:  1. Amitriptyline 150 mg nightly  2. Folic acid 400 mg daily.  3. Klor-Con 40 mEq daily.  4. Methadone.  5. Iron.  6. Lasix 40 mg q.a.m. and 20 mg q.p.m.  7. Prednisone 10 mg daily.  8. Methotrexate.  9. Aspirin  81 mg daily.  10.Omeprazole 20 mg daily.  11.Lyrica 150 mg t.i.d.  12.Metoprolol 25 mg b.i.d.  13.Lipitor 80 mg nightly  14.Multivitamin.  15.Vitamin C.   REVIEW OF SYSTEMS:  As stated in the HPI and otherwise negative for  other systems.   PHYSICAL EXAMINATION:  The patient is in no distress.  Blood pressure 154/98, heart rate 100 and regular, weight 237 pounds.  HEENT:  Eyelids unremarkable.  Pupils equal, round and reactive to  light.  Fundi not visualized.  Oral mucosa unremarkable.  NECK:  No jugular distention at 45 degrees.  Carotid upstroke brisk and  symmetrical.  No bruits, no thyromegaly.  LYMPHATICS:  No adenopathy.  LUNGS:  Clear to auscultation bilaterally.  CHEST:  Well-healed sternotomy scar.  HEART:  PMI not displaced or sustained, S1 and S2 within normal limits,  no S3, no S4, no clicks, no rubs, no murmurs.  ABDOMEN:  Obese, positive bowel sounds, normal in frequency and  pitch,  no bruits, no rebound, no guarding. no midline pulsatile mass or  organomegaly.  SKIN:  No rashes, no nodules.  EXTREMITIES:  Severe rheumatoid arthritis changes, 2+ radial pulse, 1+  dorsalis pedis and posterior tibialis bilaterally, trace bilateral lower  extremity edema.  NEUROLOGIC:  Grossly intact.   EKG:  Sinus rhythm, rate 87, axis within normal limits, intervals within  normal limits, no acute ST-T wave changes.   ASSESSMENT/PLAN:  1. Coronary disease.  The patient is having no new symptoms.  No      further cardiovascular testing is suggested.  He will continue with      aggressive risk reduction.  2. Diastolic dysfunction.  He probably has some element of this.  We      discussed fluid restriction as he always is drinking water for his      dry mouth.  He understands salt restriction.  He will continue on      the current dose of diuretic.  3. Lung disease.  The patient has emphysema and some decreased      diffusion capacity.  I have e-mailed Dr. Drue Novel and sent him a copy  of      these pulmonary function tests and asked if he would follow up to      see if there were any suggestions of medication changes.  Of note,      he has some small pulmonary nodules.  We discussed this with him      today.  He will be put in the computer for a 66-month follow-up CT      scan and he is aware of this.  4. Hypertension.  Blood pressure is slightly elevated, but this is      unusual.  This will be followed      going forward at his multiple doctors' appointments and he can do      this at home.  5. Follow-up.  We will see the patient again in about 4 months or      sooner if needed.     Rollene Rotunda, MD, [facility]  Electronically Signed    JH/MedQ  DD: 09/24/2007  DT: 09/25/2007  Job #: [id number]   cc:   Willow Ora, MD

## 2011-01-08 NOTE — H&P (Signed)
NAMECORBYN, WILDEY NO.:  [id number]   MEDICAL RECORD NO.:  [id number]          PATIENT TYPE:  INP   LOCATION:  5030                         FACILITY:  [facility]   PHYSICIAN:  Lucita Ferrara, MD         DATE OF BIRTH:  Oct 13, 1950   DATE OF ADMISSION:  07/24/2008  DATE OF DISCHARGE:                              HISTORY & PHYSICAL   PRIMARY CARE DOCTOR:  Dr. Drue Novel at [facility].   CHIEF COMPLAINT:  Left foot pain.   HISTORY OF PRESENT ILLNESS:  The patient is a 60 year old with chronic  rheumatoid arthritis, peripheral polyneuropathy, coronary artery disease  status post coronary artery bypass graft presented to [facility]  emergency department at [facility] with left-sided foot pain.  Symptoms  started 10 days prior. Located second toe with cellulitis warmth,  erythema worsening pain, edema and purulent discharge as a result.  The  patient supposedly has poor sensation secondary to polyneuropathy and  cannot recognize any peripheral injuries.  The patient denies any fevers  or chills.  Supposedly patient has recurrent wounds in lower extremity.  She goes to the wound care center at [facility].  The surgeon over the there is Dr. Samuel Bouche.  The patient denies any  claudication or pain upon ambulation.  He denies recent travel or risk  factors for deep vein thrombosis.  Lower extremities are warm and pulses  are visible.   The patient's past medical history is significant for:  1. Rheumatoid arthritis on chronic prednisone and methotrexate      therapy.  2. Psoriasis.  3. 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  4. Hypertension.  5. Gastroesophageal reflux disease.  6. History of smoking.  7. Documented history of chronic obstructive pulmonary disease.  8. Coronary artery disease status post coronary artery bypass graft 2      years ago.   SURGERIES:  The patient is married, disabled because of his rheumatoid  arthritis.   SOCIAL HISTORY:  Denies drugs or alcohol.  Remote history of smoking.   FAMILY HISTORY:  Noncontributory.   REVIEW OF SYSTEMS:  As per the HPI.  Otherwise negative.   MEDICATIONS:  Include:  1. Methotrexate 12.5 mg p.o. 6 times p.o. weekly.  2. Folic acid 400 mcg p.o. three times a day.  3. Prednisone 9.5 mg p.o. daily.  4. Lyrica 150 mg p.o. t.i.d.  5. Omeprazole 20 mg p.o. b.i.d.  6. Multivitamin 1 tablet p.o. daily.  7. Albuterol metered dose inhaler 2 puffs every 4 hours.  8. Metoprolol 25 mg p.o. b.i.d.  9. Lasix 40 mg in the morning and 40 mg in the evening.  10.Amitriptyline 150 mg p.o. nightly.  11.Alprazolam 0.5 mg p.o. p.r.n. for sleep.  12.Klor-Con 20 mEq p.o. b.i.d.  13.Methadone 5 mg p.o. 5 times a day.  14.Vitamin B12.  15.Iron.  16.Aspirin 81 mg p.o. daily.  17.Spiriva HandiHaler daily.  18.Advair 100/50 one puff p.o. daily.   ALLERGIES:  No known drug allergies.   REVIEW OF SYSTEMS:  As per the HPI.  Otherwise negative.  PHYSICAL EXAMINATION:  GENERAL:  The patient is in no acute distress.  VITAL SIGNS:  Blood pressure is 118/68, pulse 72, respirations 18,  temperature 97.8, pulse ox 97% on room air.  HEENT: Normocephalic, atraumatic.  Sclerae are anicteric.  Extraocular  muscles are intact.  NECK:  Supple.  No JVD or bruits.  CARDIOVASCULAR: S1 and S2.  Regular rate and rhythm.  No murmurs, rubs  or clicks.  LUNGS:  Clear to auscultation bilaterally with no wheezes, rales, or  rhonchi.  ABDOMEN:  Soft, nontender, and nondistended with positive bowel sounds.  EXTREMITIES: Left leg has edema almost to the knee.  Left forefoot with  erythema/warmth/ tenderness especially in the second toe.  There is no  frank abscess.  There is no necrotic tissue noted.  NEURO:  Patient is alert and oriented x3.  Cranial nerves II through XII  are grossly intact.   RADIOLOGICAL DATA:  Foot digital shows worsening soft tissue swelling  with osteopenia,  changes consistent with longstanding rheumatoid  arthritis with possible superimposed osteoarthritis.  There are no clear-  cut changes of osteomyelitis of observed. Elective MRI would be more  sensitive however.   LABORATORY DATA:  Beta natriuretic peptide 132.  Complete metabolic  panel shows mildly elevated glucose 138, BUN 12, creatinine 0.7.  Alk  phos, AST, and ALT within normal limits.  Lactic acid level 1.7.  CBC  shows a white count 12.6, hemoglobin 12.9, hematocrit 38.7, platelet  count of 230.   ASSESSMENT:  A 60 year old with;  1. Left second toe cellulitis likely secondary to immune deficiency      secondary to chronic prednisone therapy and rheumatoid arthritis      and collagen vascular disease.  2. Rheumatoid arthritis on chronic methotrexate and prednisone.  3. Chronic lower extremity edema.  4. 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  5. Elevated glucose level, rule out diabetes type 2 versus steroid      induced.  6. Leukocytosis secondary to the left toe cellulitis plus prednisone.  7. Chronic obstructive pulmonary disease on Spiriva and albuterol, not      on oxygen at home.  8. Psoriasis.  9. Coronary artery disease status post coronary artery bypass graft.  10.Gastroesophageal reflux disease.   DISCUSSION AND PLAN:  In physical exam the patient's peripheral pulses  2+ equal and bilaterally.  His lower extremity is warm.  I am less  concerned about peripheral vascular disease, peripheral arterial  disease, peripheral vascular disease in the differential diagnosis given  that the patient had coronary artery disease.  For his cellulitis we  will culture, wound care, wound consultation and will initiate  vancomycin and Zosyn.  X-ray does not show signs of osteomyelitis.  For  his rheumatoid arthritis will continue methotrexate and prednisone.  For  his chronic lower extremity edema  we will continue supportive measures,  elevation of  his lower extremities. For his elevated glucose we will  assess a hemoglobin A1c.  Will continue his Spiriva and albuterol.  If  needed we will proceed with an orthopedic consultation during this  visit.  DVT and GI prophylaxis with Protonix and Lovenox. The rest of  the plan will depend on his progress and consultant recommendations.      Lucita Ferrara, MD  Electronically Signed     RR/MEDQ  D:  07/25/2008  T:  07/25/2008  Job:  [id number]

## 2011-01-08 NOTE — Discharge Summary (Signed)
Joseph [name], Joseph [name]               ACCOUNT NO.:  [id number]   MEDICAL RECORD NO.:  [id number]          PATIENT TYPE:  INP   LOCATION:  4740                         FACILITY:  [facility]   PHYSICIAN:  Raenette Rover. Felicity Coyer, MDDATE OF BIRTH:  24-Nov-1950   DATE OF ADMISSION:  05/22/2008  DATE OF DISCHARGE:  05/24/2008                               DISCHARGE SUMMARY   DISCHARGE DIAGNOSES:  1. Fever/chills/hypotension and a mean compromised patient to rule out      suspect viral etiology.  2. Rheumatoid arthritis, on prednisone/methotrexate.  3. Hypertension.  4. Gastroesophageal reflux disease.  5. Chronic obstructive pulmonary disease.  6. History of coronary artery disease with history of coronary artery      bypass grafting.  7. History of peripheral neuropathy.  8. History of psoriasis.   HISTORY OF PRESENT ILLNESS:  Joseph [name] is a 60 year old male who was  admitted on May 04, 2008, with chief complaint of fever and chills.  He was also noted to be hypotensive on admission.  He was admitted for  further evaluation and treatment.   COURSE OF HOSPITALIZATION:  1. Fever, chills, hypotension, and suspect viral etiology.  The      patient was admitted.  He did undergo serial cardiac enzymes, which      were negative.  He was noted to have of mild leukocytosis with a      white count 15.9 on admission; however, he is maintained on chronic      steroids.  Follow up white count was 9.9.  He was placed on empiric      Zosyn.  Urine culture was performed, which was negative and blood      cultures at this time remained no growth to date.  He did note      history of recent left foot surgery and he had had a chronic ulcer      of left lower extremity, which has now healed.  An x-ray was      performed in the left foot, which showed no definite osteomyelitis,      but did note several areas of severe osteoporosis.  The patient is      currently receiving a calcium supplement, consider  addition of      bisphosphonate will defer to the patient's primary MD.   At that this time the patient is afebrile.  His blood pressure stable.  He has had no further chills.  He does note a dry cough; however, his  lungs remained clear to auscultation bilaterally.  At this time, we plan  to discharge the patient to home.  We will continue empiric Augmentin to  complete a total of a 7-day course of antibiotics in setting of the  patient being mean compromised.   MEDICATIONS AT TIME OF DISCHARGE:  1. Methotrexate 12.5 mg tabs six times p.o. weekly.  2. Folic acid 400 mcg p.o. daily.  3. Prednisone 9.5 mg p.o. daily.  4. Lyrica 150 mg p.o. t.i.d.  5. Omeprazole 20 mg p.o. b.i.d.  6. Multivitamin 1 tablet p.o. daily.  7. Albuterol MDI 2  puffs every 4 hours.  8. Metoprolol 25 mg p.o. b.i.d.  9. Furosemide 40 mg in the morning and 20 mg in the evening.  10.Amitriptyline 150 mg p.o. nightly.  11.Alprazolam 0.5 mg p.o. p.r.n. sleep.  12.Klor-Con 20 mEq p.o. b.i.d.  13.Vitamin C 100 mg p.o. daily.  14.Methadone hydrochloride 5 mg p.o. five times daily.  15.Lipitor 80 mg p.o. nightly.  16.B12 as before.  17.Iron as before.  18.Aspirin 81 mg p.o. daily.  19.Spiriva 1 inhalation daily.  20.Advair 100/50 one puff twice daily.  21.Augmentin 500 mg p.o. b.i.d. x5 days.   DISPOSITION:  The patient will be discharged to home.   FOLLOWUP:  He is scheduled to follow up with Dr. Willow Ora, on Wednesday,  June 01, 2008, at 2:45 p.m.  He is instructed call Dr. Drue Novel or go to  the emergency room should he develop fever greater than 101, recurrent  chills, worsening cough or weakness.   Greater than 30 minutes was spent on discharge planning.      Joseph Craze, NP      Raenette Rover. Felicity Coyer, MD  Electronically Signed    MO/MEDQ  D:  05/24/2008  T:  05/24/2008  Job:  [id number]   cc:   Willow Ora, MD

## 2011-01-08 NOTE — Assessment & Plan Note (Signed)
[facility]                                 ON-CALL NOTE   Joseph [name], Joseph [name]                        MRN:          [id number]  DATE:08/29/2007                            DOB:          1951/08/03    Primary cardiologist, Dr. Antoine Poche.   Joseph [name] was discharged home from [facility] on January 2, following  admission for multifactorial dyspnea.  Because he had a cough and sputum  on admission, he was started on a Z-pack.  He was due two more doses,  but apparently did not receive the prescription for it at time of  discharge.  I went ahead and called in a prescription to [facility] in  [place] at [phone], for Zithromax 250 mg one p.o. daily, #2 with no  refills.      Nicolasa Ducking, ANP  Electronically Signed      Rollene Rotunda, MD, [facility]  Electronically Signed   CB/MedQ  DD: 08/29/2007  DT: 08/29/2007  Job #: [id number]

## 2011-01-08 NOTE — Assessment & Plan Note (Signed)
[facility]                            CARDIOLOGY OFFICE NOTE   SHENANDOAH, VANDERGRIFF                      MRN:          [id number]  DATE:07/31/2007                            DOB:          1950-09-29    PRIMARY CARE PHYSICIAN:  Willow Ora, MD.   REASON FOR PRESENTATION:  Evaluate patient with coronary disease status  post CABG.   HISTORY OF PRESENT ILLNESS:  The patient returns for followup.  He was  last seen by our nurse practitioner in early November as he had dyspnea  and increased edema.  He had his Lasix increased by Dr. Drue Novel and  responded nicely to this.  He since has had some mild lower extremity  swelling.  He has been worried about this, but it is not causing him any  discomfort.  His dyspnea is actually much improved.  He did have labs  drawn a couple of days ago by Dr. Drue Novel.  He has had no presyncope or  syncope.  He denies any chest discomfort, neck or arm discomfort.  He  has had no palpitations.  He is improved with his mobility from his  arthritis.  He actually walked in here today and came without his wife!   Of note, the patient did have an echocardiogram ordered at his November  appointment.  This demonstrated that his EF was well preserved.  There  were no significant valvular abnormalities.   PAST MEDICAL HISTORY:  1. Coronary artery disease status post CABG (LIMA to the LAD, left      radial to obtuse marginal, SVG to the first diagonal and SVG to      PDA.  His EF was 50 to 55%).  2. Severe rheumatoid arthritis.  3. Psoriasis.  4. Polyneuropathy.  5. Emphysema.  6. Previous tobacco abuse (has not smoked cigarettes since his      surgery).  7. Previous renal insufficiency.   ALLERGIES:  None.   CURRENT MEDICATIONS:  1. Methadone.  2. Klor-Con 40 mEq daily.  3. Furosemide 40 mg daily.  4. Folic acid 400 mg daily.  5. Amitriptyline 150 mg q.h.s.  6. Iron 65 mg daily.  7. Vitamin C.  8. Multivitamin.  9. Lipitor 80  mg daily.  10.Metoprolol 25 mg b.i.d.  11.B Vitamin.  12.Lyrica 150 mg t.i.d.  13.Omeprazole 20 mg daily.  14.Prednisone 10 mg daily.  15.Aspirin 81 mg daily.  16.Methotrexate 2.5 mg tablets weekly.   REVIEW OF SYSTEMS:  As stated in the HPI and otherwise negative for  other systems.   PHYSICAL EXAMINATION:  GENERAL:  The patient is in no distress.  VITAL SIGNS:  Blood pressure 137/88, heart rate 82 and regular, weight  239 pounds, body mass index 20.  HEENT:  Eyelids unremarkable, pupils equal, round, and react to light,  fundi not visualized, oral mucosa unremarkable.  NECK:  No jugulovenous distention at 45 degrees, carotid upstroke brisk  and symmetric, no bruits, no thyromegaly.  LYMPHATICS:  No cervical, axillary, or inguinal adenopathy.  LUNGS:  Clear to auscultation bilaterally.  BACK:  No costovertebral angle tenderness.  CHEST WALL:  A well-healed sternotomy scar.  HEART:  PMI not displaced or sustained, S1 and S2 within normal limits,  no S3, no S4, no clicks, no rubs, no murmurs.  ABDOMEN:  Obese, positive bowel sounds normal in frequency and pitch, no  bruits, no rebound, no guarding, no midline pulsatile mass, no  organomegaly.  SKIN:  No rashes, no nodules.  EXTREMITIES:  Severe rheumatoid arthritis changes, 2+ upper pulses, 1+  dorsalis pedis and posterior tibialis bilaterally, trace bilateral lower  extremity ankle edema.  NEURO:  Grossly intact throughout.   ASSESSMENT AND PLAN:  1. Dyspnea.  The patient has dyspnea improved with a doubling of his      diuretic.  He has had his labs followed.  At this point, he will      continue with the diuretic, salt and fluid restriction.  No further      testing is warranted.  2. Coronary disease.  He is having no new symptoms.  He is going to      continue with secondary risk reduction.  No further cardiovascular      testing is suggested.  3. Dyslipidemia per Dr. Drue Novel.  The goals of LDL less than 100 and HDL       greater than 40.  4. Followup.  Will see him back in six months or sooner if needed.     Rollene Rotunda, MD, [facility]  Electronically Signed    JH/MedQ  DD: 07/31/2007  DT: 08/01/2007  Job #: [id number]

## 2011-01-08 NOTE — H&P (Signed)
NAME:  Joseph [name], MARKEN NO.:  [id number]   MEDICAL RECORD NO.:  [id number]          PATIENT TYPE:  EMS   LOCATION:  MAJO                         FACILITY:  [facility]   PHYSICIAN:  Nicolasa Ducking, ANP DATE OF BIRTH:  March 17, 1951   DATE OF ADMISSION:  08/25/2007  DATE OF DISCHARGE:                              HISTORY & PHYSICAL   PRIMARY CARDIOLOGIST:  Rollene Rotunda, MD, [facility]   PRIMARY CARE Khyler Urda:  Willow Ora, MD.   PATIENT PROFILE:  A 60 year old married Caucasian male with prior  history of CAD and chronic lower extremity edema and dyspnea who  presents with recurrence of dyspnea and lower extremity edema.   PROBLEMS:  1. Dyspnea likely multifactorial.  2. CAD.      a.     Status post CABG x4 April 23, 2006.  LIMA to the LAD, left       radial artery to the OM, vein graft to diagonal, vein graft to PDA  3. Rheumatoid arthritis.  4. Psoriasis.  5. Polyneuropathy with chronic lower extremity pain.  6. Remote tobacco abuse.  7. History of renal insufficiency.  8. Hypertension.  9. GERD and gastritis.   HISTORY OF PRESENT ILLNESS:  This 60 year old married Caucasian male  with history of CAD status post CABG x4 August 2007 who was in his usual  state of health until October 2008 when he began to experience dyspnea  on exertion and bilateral lower extremity edema requiring doubling of  his Lasix dose with improvement in symptoms.  2D echocardiogram  performed November 2008 showed normal LV function and no evidence for  diastolic dysfunction or elevated pulmonary pressures.  He was last seen  by Dr. Antoine Poche on July 31, 2007 and was doing well.  Approximately 4  days ago he noted increasing lower extremity edema with dyspnea on  exertion as well as a rattling cough and upper respiratory congestion  with intermittent inspiratory and expiratory wheezing.  He thinks his  weight is up about 4 or 5 pounds over the past 2 or 3 weeks.  He called  in to the office  today and it was advised he present to the ED.  Here,  he appears stable and is not acutely short of breath.  His chest x-ray  shows mild vascular congestion, and his BNP is only 100.  Cardiac  markers are pending.   ALLERGIES:  NO KNOWN DRUG ALLERGIES.   HOME MEDICATIONS:  1. Aspirin 81 mg daily.  2. Folic acid 1200 mg daily.  3. K-Dur 20 mEq daily.  4. Lasix 40 mg daily.  5. Amitriptyline 150 mg at bedtime.  6. Ferrous sulfate 65 mg daily.  7. Multivitamin one daily.  8. Lipitor 80 mg at bedtime.  9. Lopressor 25 mg b.i.d.  10.Lyrica 150 mg t.i.d.  11.Protonix 40 mg daily.  12.Methadone 5 mg every 8:00 a.m. and 4:00 p.m.  13.Prednisone 10 mg daily.  14.Methotrexate 2.5 mg every Thursday.  15.Albuterol inhaler.  16.Vitamin C.  17.Vitamin B.   FAMILY HISTORY:  Mother died of CHF at 21.  The father is alive at  88  with CAD and CABG.   SOCIAL HISTORY:  He lives in [place] with his wife.  He is disabled  secondary to rheumatoid arthritis.  He has a 40 pack-year history of  tobacco abuse quitting in August 2007.  He previously drank occasional  alcoholic beverage but none for the past 5 years.  He denies any drug  use.  He does exercise at the [facility].   REVIEW OF SYSTEMS:  Positive for dyspnea on exertion, lower extremity  edema, cough, wheezing and lower extremity pain.  Otherwise all systems  reviewed and negative.   PHYSICAL EXAM:  VITAL SIGNS:  Temperature 97.9, heart rate 91,  respirations 13, blood pressure 126/78, pulse ox 96% on 2 liters.  GENERAL:  Pleasant white male in no acute distress.  Awake, alert and  oriented x3.  SKIN:  Warm, dry and pink with psoriatic lesions scattered.  NEUROLOGIC:  Grossly intact and nonfocal.  NECK:  No bruits or JVD.  LUNGS:  Respirations were unlabored with scattered rhonchi and faint  inspiratory and expiratory wheezing as well as bibasilar crackles.  CARDIAC:  Regular S1 and S2.  No S3, S4 or murmurs.  ABDOMEN:  Round, soft,  nontender, nondistended.  Bowel sounds present  x4.  EXTREMITIES:  Warm, dry, pink with 1+ bilateral lower extremity edema.  Dorsalis pedis pulses are 2+ and equal bilaterally.   Chest x-ray shows COPD with interstitial accentuation that could be  acute or chronic.  Mild vascular congestion.  Lab work:  Hemoglobin  12.9, hematocrit 38.2, WBC 8.4, platelets 282, sodium 136, potassium  3.6, chloride 105, CO2 24, BUN 7, creatinine 0.67, glucose 149.   ASSESSMENT/PLAN:  1. Dyspnea:  The patient has had some lower extremity edema with      associated dyspnea as well as some weight gain over the past 2      weeks or so.  Further, he has had a rattling cough with some      wheezing and congestion.  He does not with markedly volume      overloaded with exception of 1+ edema.  Chest x-ray shows just mild      vascular congestion and his BNP is only 100.  We will give him 1      dose of 40 mg of IV Lasix.  We will continue his home dose of      Lasix.  We will also add Zithromax for probable left upper      respiratory infection and continue his home albuterol.  He also      takes oral prednisone for his rheumatoid arthritis.  We will plan      to obtain a high-resolution CT of his chest to rule out PE as well      as evaluate for interstitial lung disease given the chronic nature      of his dyspnea which is not clearly related to CHF.  Of note, he      had normal LV function and RV function by echo in December 2008      without evidence of significantly elevated pulmonary pressures.      With regards to edema, we will also look more closely at his lab      work and obtain albumin as well as a urinalysis for      microalbuminuria.  2. CAD:  He denies any chest pain.  He has not had anything      reminiscent of his previous anginal equivalent.  We will check      cardiac markers.  Could consider ischemic evaluation if workup here      is unrevealing.  3. Rheumatoid arthritis:  Continue home  medications.  He uses      methotrexate on Thursdays.  4. Psoriasis:  Continue home medications.  5. Polyneuropathy with chronic lower extremity pain:  Continue home      medications.  6. Remote tobacco abuse:  The patient quit in 2007 and he is      encouraged to remain quit.  7. History of renal insufficiency surrounding his bypass surgery:      Watch creatinine closely with use of diuretics.  8. Hypertension:  Stable.  9. Gastroesophageal reflux disease and gastritis:  Continue PPI.      Nicolasa Ducking, ANP     CB/MEDQ  D:  08/25/2007  T:  08/26/2007  Job:  [id number]   cc:   Rollene Rotunda, MD, [facility]  Willow Ora, MD

## 2011-01-08 NOTE — Discharge Summary (Signed)
Joseph [name], Joseph [name]               ACCOUNT NO.:  [id number]   MEDICAL RECORD NO.:  [id number]          PATIENT TYPE:  INP   LOCATION:  5030                         FACILITY:  [facility]   PHYSICIAN:  Raenette Rover. Felicity Coyer, MDDATE OF BIRTH:  [birth year]   DATE OF ADMISSION:  07/24/2008  DATE OF DISCHARGE:  07/28/2008                               DISCHARGE SUMMARY   DISCHARGE DIAGNOSES:  1. Left lower extremity cellulitis.  2. Rheumatoid arthritis.  3. History of peripheral neuropathy.  4. Hypertension.  5. Gastroesophageal reflux disease.  6. Psoriasis.  7. Chronic obstructive pulmonary disease.   HISTORY OF PRESENT ILLNESS:  Mr. Biegel is a 60 year old white male  admitted on July 24, 2008, with chief complaint of left foot pain.  His past medical history is significant for chronic rheumatoid arthritis  and peripheral polyneuropathy as well as coronary artery disease.  He  noted that his symptoms of left lower extremity pain started 10 days  prior, and it was located in the second toe with warmth and erythema as  well as edema and a purulent discharge.  He was admitted for further  evaluation and treatment of cellulitis.   COURSE OF HOSPITALIZATION:  1. Left lower extremity cellulitis.  The patient was admitted.  He was      placed on IV vancomycin and Zosyn.  Blood cultures were sent, which      remained no growth to date x2 at the time of this dictation.  A      local wound culture was also sent, which was negative.  He has been      on oral antibiotics (Avelox) for 24 hours.  He remains afebrile      with normal white blood cell count.  The swelling has resolved of      the left lower extremity.  Also, of note, left lower extremity      Doppler was performed during this admission, which showed no      evidence of DVT.  He was seen by the wound care nurse on July 25, 2008, and there was no open wound or drainage noted at that      time.  He was noted to have intact  blistering skin.  At this time,      we will plan to continue local wound care with bacitracin and clean      bandages daily, and the patient has been instructed.  We will plan      also to continue antibiotics to complete a total of 10-day course.  2. Elevated A1c.  The patient was noted to have a hemoglobin A1c of      6.2, which is likely secondary to chronic steroid use.  He was      instructed on diabetic diet, this will need to be monitored as an      outpatient.  Apparently, his rheumatologist is trying to taper his      prednisone.   MEDICATIONS AT TIME OF DISCHARGE:  1. Methotrexate 2.5 mg tabs 6 tablets p.o. every Thursday.  2. Folic  acid 400 mcg p.o. t.i.d.  3. Prednisone 9 mg p.o. daily.  4. Lyrica 150 mg p.o. t.i.d.  5. Omeprazole 20 mg p.o. b.i.d.  6. Multivitamin 1 tablet p.o. daily.  7. Albuterol MDI 2 puffs every 4 hours.  8. Metoprolol 25 mg p.o. b.i.d.  9. Lasix 40 mg p.o. b.i.d.  10.Amitriptyline 150 mg p.o. nightly.  11.Alprazolam 0.5 mg p.o. nightly p.r.n.  12.Klor-Con 20 mEq p.o. t.i.d. as before.  13.Methadone 5 mg p.o. five times daily.  14.Lipitor 80 mg p.o. nightly.  15.Aspirin 81 mg p.o. daily.  16.Spiriva 18 mcg puff once daily.  17.Advair 100/50 one puff twice daily.  18.Vitamin D 5000 international units p.o. weekly.  19.Calcium with D 1 tablet p.o. daily.  20.Mucinex 600 mg p.o. b.i.d. for cough.  21.Avelox 400 mg p.o. daily for 5 days to start July 29, 2008.   DISPOSITION:  The patient will be discharged to home.   FOLLOWUP:  He will be set up to follow up with Dr. Laury Axon who will be  covering for Dr. Willow Ora next week in his absence on Monday, August 01, 2008, at 3:45 p.m.  He is instructed to contact MD should he develop  fever greater than 101, weakness, chills, increased redness, pain, or  swelling of the left foot or leg.  He is also instructed to apply  antibiotic ointment to left second toe and cover with clean bandage  daily and  to maintain a diabetic diet.   Thirty minutes were spent on discharge planning.      Sandford Craze, NP      Raenette Rover. Felicity Coyer, MD  Electronically Signed    MO/MEDQ  D:  07/28/2008  T:  07/28/2008  Job:  [id number]   cc:   Willow Ora, MD

## 2011-01-08 NOTE — Assessment & Plan Note (Signed)
[facility]                            CARDIOLOGY OFFICE NOTE   CAISEN, MANGAS                      MRN:          [id number]  DATE:06/29/2007                            DOB:          August 28, 1950    PRIMARY CARE PHYSICIAN:  Willow Ora, M.D.   PRIMARY CARDIOLOGIST:  Rollene Rotunda, M.D., [facility]   PATIENT PROFILE:  60 year old Caucasian male with multiple medical  problems including coronary artery disease who presents for follow up of  recent bout of edema and dyspnea.   PROBLEMS:  1. Coronary artery disease.      a.     Status post CABG x4 with a LIMA to the LAD, left radial       artery to the obtuse marginal, vein graft to the diagonal, and       vein graft to the PDA on April 23, 2006.  2. History of normal LV function.  3. Severe rheumatoid arthritis.  4. Psoriasis.  5. Polyneuropathy with chronic lower extremity pain.  6. Emphysema.  7. Remote tobacco abuse.  8. Previous renal insufficiency.   HISTORY OF PRESENT ILLNESS:  60 year old Caucasian male with the above  problem list.  He noted approximately 1 week ago increasing lower  extremity edema as well as mild dyspnea on exertion.  His weight was up  approximately 2-3 pounds.  Therefore, he called into the office on  June 24, 2007.  It was recommended that he double up his Lasix over  the subsequent 4 days which he did, with improvement in weight, decrease  in lower extremity edema, and resolution of dyspnea.  He denied ever  experiencing any chest pressure or tightness or symptoms similar to his  previous anginal equivalent (chest pain).  He resumed his daily Lasix on  June 28, 2007, yesterday, and feels well.  His weight today on our  scales is 234 pounds with his brace on which is 2 pounds less than his  previous weight in July when he was feeling well.   HOME MEDICATIONS:  1. Folic acid 1200 mg daily.  2. Klor-Con 20 mEq daily.  3. Methotrexate 2.5 mg 6 tablets weekly.  4. Aspirin 81 mg daily.  5. Prednisone 10 mg daily.  6. Omeprazole 20 mg daily.  7. Lyrica 150 mg t.i.d.  8. B1 100 mg q.a.m.  9. Metoprolol 25 mg b.i.d.  10.Lipitor 80 mg q.h.s.  11.Multivitamin daily.  12.Furosemide 20 mg daily.  13.Vitamin C 500 mcg daily.  14.Iron 65 mg daily.  15.Amitriptyline 75 mg 2 tablets q.h.s.  16.Methadone 5 mg b.i.d.   PHYSICAL EXAMINATION:  VITAL SIGNS:  Blood pressure 120/80, heart rate  72, respirations 16.  Weight 234 pounds with his brace on.  GENERAL:  Pleasant white male in no acute distress.  Awake, alert, and  oriented x3.  NECK:  No bruits or JVD.  LUNGS:  Respirations regular and unlabored.  Clear to auscultation.  CARDIAC:  Regular S1 and S2.  No S3, S4, or murmurs.  ABDOMEN:  Obese, soft, nontender, nondistended.  Bowel sounds present  x4.  EXTREMITIES:  Warm, dry, pink.  No clubbing or cyanosis, with trace  right lower extremity edema.  Distal pulses are  2+ and equal bilaterally.   LABORATORY DATA:  EKG shows sinus rhythm without any acute ST-T changes.   ASSESSMENT AND PLAN:  1. Dyspnea.  The patient's ejection fraction was previously normal by      2-D echocardiogram in 2007.  He did have mild edema and dyspnea      over this past week which is now resolved following an increase in      his Lasix.  He is now back on his usual dose of Lasix and feeling      well.  We will check a basic metabolic panel today to evaluate      renal function as well as potassium in light of recent diuresis and      also obtain a 2-D echocardiogram to reevaluate left ventricular      function.  If he has a change in ejection fraction or wall motion,      would then strongly consider an ischemic evaluation, although      notably the patient has not had any symptoms reminiscent of prior      angina.  2. Coronary artery disease.  See number 1.  He has not had any chest      discomfort.  He remains on aspirin, beta-blocker, and statin      therapy, all  of which he is tolerating well.  3. Hyperlipidemia.  Continue statin therapy.  He had lipids checked in      February of this year revealing a total cholesterol of 140 with an      low-density lipoprotein of 81.  Liver function tests were normal at      that time.  4. Severe rheumatoid arthritis followed by Dr. Drue Novel.  He is on      methotrexate as well as prednisone.  Certainly prednisone could      contribute to fluid retention and possible heart failure symptoms,      although he is on this chronically and is adept at avoiding salt.  5. Polyneuropathy with chronic pain.  He takes methadone twice a day.   DISPOSITION:  Will obtain lab work today with an echocardiogram  hopefully this week.  He will follow up with Dr. Antoine Poche in  approximately 3-4 weeks.      Nicolasa Ducking, ANP  Electronically Signed      Veverly Fells. Excell Seltzer, MD  Electronically Signed   CB/MedQ  DD: 06/29/2007  DT: 06/30/2007  Job #: [id number]

## 2011-01-08 NOTE — Discharge Summary (Signed)
NAME:  Joseph [name], Joseph [name] NO.:  [id number]   MEDICAL RECORD NO.:  [id number]          PATIENT TYPE:  INP   LOCATION:  3032                         FACILITY:  [facility]   PHYSICIAN:  Willow Ora, MD           DATE OF BIRTH:  1951-05-11   DATE OF ADMISSION:  03/29/2008  DATE OF DISCHARGE:  04/01/2008                               DISCHARGE SUMMARY   BRIEF HISTORY AND PHYSICAL:  [name] is a patient with multiple  medical problems including rheumatoid arthritis on chronic methotrexate  and steroids who presented to the office with one-day history of fevers,  chills, and weakness.  This happened in the setting of a prior left  Achilles tendon repair 4 days prior to presentation.   PHYSICAL EXAMINATION:  GENERAL:  The patient was lethargic.  He was  quite sleepy but appropriate.  VITAL SIGNS:  O2 sat 87%, pulse 88, and blood pressure systolic was 70.  LUNGS:  Clear to auscultation bilaterally.  CARDIOVASCULAR:  Regular rate and rhythm without murmur.  ABDOMEN:  Soft, nontender and not distended.  EXTREMITIES:  Cast in the left leg without any redness or edema on the  visible parts of the leg.   LABORATORY DATA AND X-RAYS:  Initial white count was 21.4 with a  hemoglobin of 13.0 and platelets of 181, potassium 4.3, creatinine 0.9,  blood sugar 124.  LFTs were normal.  Cardiac enzymes x2 were negative.  Calcium was slightly low at 8.3.  Legionella urine test was negative.  Sputum evaluation was inconclusive as the sample was not good.  Urine  culture was negative.  Blood cultures at the time of discharge are  negative.  Chest x-ray showed a new right upper lobe  airspace disease  probably mild right lower lobe airspace disease as well.  Echocardiogram  showed an EF of 55-60%.  There were no left ventricular regional wall  motion abnormalities.  There was mild focal basal septal hypertrophy.  The pulmonary artery systolic pressure was mildly increased.  The  patient also had  bilateral lower extremity ultrasounds that were  negative for DVTs.  A D-dimer was 8.43.   HOSPITAL COURSE:  Joseph [name] was admitted to the Intensive Care Unit with  the problems described above.  Of note,  prior to his presentation to  the primary care office, her was evaluated by orthopedic surgery, they  removed the cast and there were no signs of infection at the surgical  site.  At the hospital, he was started on IV fluids, as well as IV  antibiotic with the assistance of the critical care team.  They added  vancomycin and Levaquin to the Zosyn already started upon admission.  Fortunately, he responded well to the treatment.  He did not require  intubation or vasopressors.  He was started upon admission on stress  doses of steroids as well.  Although the D-dimer was positive, CT  angiogram was not pursued because we thought that we had a clear source  for his hypotension.  At the time of discharge, he is much  improved and  feeling basically back to normal.  I think at this point he got maximal  hospital benefit and he will be discharged home today.  Multiple  questions were answered during the discharge day.  The final diagnosis  was sepsis and the patient had multiple questions about this diagnosis.  We also discussed the fact that he is taking two immunosuppressants  namely methotrexate and prednisone and I encouraged him to see his  rheumatologist, Dr. Luster Landsberg and discussed the need for continue with  those medications at present dose.  He may benefit from either decrease  the dose of his medications or discontinue them.  Again, I encouraged  him to discuss that with his rheumatologist.  At this point, he will be  discharged with the following instructions.  1. Ceftin 250 mg twice a day for 1 week.  2. Prednisone 30 mg for 2 days, 20 mg for 2 days, and then 10 mg      daily.  3. Methotrexate 2.5 mg tablets 6 tablets a week.  4. Folic acid as before.  5. Lyrica 150 three times a  day.  6. Omeprazole 20 one p.o. daily.  7. Albuterol 2 puffs every 4 hours as needed.  8. Metoprolol 25 mg 1 tablet twice a day.  9. Lasix 20 mg one in the morning and one in the afternoon.  10.Amitriptyline 75 mg 2 tablets at bedtime.  11.Alprazolam as needed for sleep.  12.Potassium 20 mEq three times a day.  13.Methadone 5 mg five times a day.  14.Lipitor 80 mg one p.o. at bedtime.  15.Spiriva daily.  16.Advair 250/50 as before.  17.Continue with oxygen therapy at home.  18.To follow up with PCP, Dr. Drue Novel, in 10 days.  The patient to call      and make an appointment.  19.Come back to the office or to the ER for high fever, mental status      changes or any other major symptoms that were discussed with the      patient.   ADMITTING DIAGNOSIS:  Hypotension and fever.   DISCHARGE DIAGNOSES:  1. Sepsis.  2. Pneumonia.  3. Chronic immunosuppression with methotrexate and prednisone for a      history of rheumatoid arthritis.  4. Psoriasis.  5. Hypertension.  6. Gastroesophageal reflux disease.  7. Chronic obstructive pulmonary disease with home oxygen.  8. Coronary artery disease status post coronary artery bypass grafting      asymptomatic during this admission.  9. Positive D-dimer with negative ultrasound of the legs.  No CT      angiogram pursued during this admission.      Willow Ora, MD  Electronically Signed     JP/MEDQ  D:  04/01/2008  T:  04/01/2008  Job:  [id number]

## 2011-01-08 NOTE — Assessment & Plan Note (Signed)
[facility]                            CARDIOLOGY OFFICE NOTE   KAIRYN, OLMEDA                      MRN:          [id number]  DATE:03/12/2007                            DOB:          May 16, 1951    PRIMARY CARE PHYSICIAN:  Willow Ora, M.D.   REASON FOR PRESENTATION:  Evaluate patient with coronary disease, status  post CABG.   HISTORY OF PRESENT ILLNESS:  Patient presents for followup of his  coronary disease.  Since I have seen him, he has completed cardiac and  pulmonary rehab, six months, at [facility].  He thought the program was  wonderful.  He says he is breathing better.  He has less joint pain.  He  is not having any chest discomfort.  He is not having any palpitations,  presyncope or syncope.  He is now starting exercise at the [facility].  He is  also following with a pain management specialist for treatment of his  severe rheumatoid arthritis and he is improved in that respect, as well.  He has had some very mild lower extremity swelling.   PAST MEDICAL HISTORY:  Coronary artery disease, status post CABG (LIMA  to the LAD, left radial to an obtuse marginal, SVG to the first diagonal  and SVG to PDA.  His EF is 50-55%), severe rheumatoid arthritis,  psoriasis, polyneuropathy, emphysema, previous tobacco abuse (he has not  smoked since his surgery), previous renal insufficiency.   ALLERGIES:  None.   CURRENT MEDICATIONS:  1. Methadone 5 mg b.i.d.  2. Amitriptyline.  3. Iron 65 mg daily.  4. Vitamin C.  5. Furosemide 20 mg daily.  6. Xanax 0.5 mg t.i.d.  7. Multivitamin.  8. Lipitor 80 mg q.h.s.  9. Metoprolol 25 mg b.i.d.  10.B1.  11.Lyrica 150 mg t.i.d.  12.Omeprazole 20 mg daily.  13.Prednisone 10 mg daily.  14.Aspirin 81 mg daily.  15.Methotrexate 2.5 mg tablets a week.  16.Klor-Con 20 mEq daily.  17.Folic acid.   REVIEW OF SYSTEMS:  As stated in the HPI, and otherwise negative for  other systems.   PHYSICAL EXAMINATION:   The patient is in no distress.  Blood pressure  96/68, heart rate 75 and regular.  Weight 236 pounds.  HEENT:  Eyelids unremarkable.  Pupils equal, round and reactive to  light.  Fundi not visualized.  Oral mucosa unremarkable.  NECK:  No jugular venous distention at 90 degrees.  Wave form within  normal limits.  CHEST:  Well-healed sternotomy scar, without sternal mobility.  HEART:  S1 and S2 within normal limits.  No S3, no murmurs.  Distant  heart sounds.  ABDOMEN:  Mildly obese, positive bowel sounds, no rebound, no guarding,  no organomegaly.  SKIN:  No rashes, no nodules.  EXTREMITIES:  Two-plus upper pulses, 1+ dorsalis pedis bilaterally, mild  ankle edema, severe diffuse rheumatoid arthritis joint changes.  NEUROLOGIC:  Grossly intact, except for lower extremity muscle weakness.   EKG:  Sinus rhythm, rate 75, axis within normal limits, intervals within  normal limits, no acute STT-wave changes.   ASSESSMENT AND PLAN:  1.  Coronary disease:  Patient is doing remarkably well.  I am going to      ask [employer] to pay for his [employer], as this      is medically indicated.  He will continue with the exercise and      secondary risk reduction.  2. Dyslipidemia:  I think he should get his lipids done from a      cardiovascular standpoint with his Lipitor about three times a      year.  He may need it more frequently, but I will leave this to Dr.      Drue Novel.  I am not sure how frequently he wants to get his liver      enzymes checked with the combination of methotrexate and Lipitor.      I have given him written instructions to get his lipid profile and      liver enzymes done in a couple of weeks.  3. Followup:  We will see him back in six months and then perhaps      yearly thereafter.     Rollene Rotunda, MD, [facility]  Electronically Signed    JH/MedQ  DD: 03/12/2007  DT: 03/13/2007  Job #: [id number]   cc:   Willow Ora, MD

## 2011-01-08 NOTE — Discharge Summary (Signed)
Joseph [name], Joseph [name] NO.:  [id number]   MEDICAL RECORD NO.:  [id number]          PATIENT TYPE:  INP   LOCATION:  2037                         FACILITY:  [facility]   PHYSICIAN:  Rollene Rotunda, MD, FACCDATE OF BIRTH:  [birth year]   DATE OF ADMISSION:  08/25/2007  DATE OF DISCHARGE:  08/28/2007                               DISCHARGE SUMMARY   DISCHARGE DIAGNOSES:  1. Dyspnea, multifactorial, questionable component of mild diastolic      heart failure.  The patient pending outpatient pulmonary function      test.  2. Coronary artery disease status post coronary artery bypass graft in      2007.   PAST MEDICAL HISTORY:  1. Includes rheumatoid arthritis.  2. Psoriasis.  3. Polyneuropathy with chronic lower extremity pain.  4. Remote tobacco use.  5. History of renal insufficiency.  6. Hypertension.  7. Gastroesophageal reflux disease/gastritis.   PROCEDURES:  1. Include a CT of the chest on August 25, 2007.  No evidence for      acute pulmonary embolus on exam.  Patient with extensive bullous      emphysema.  2. Chest x-ray on August 25, 2007 showing mild pulmonary vascular      congestion superimposed on chronic lung disease.  3. Lower extremity venous duplex on August 26, 2007, no evidence of      DVT, superficial thrombus or Baker's cyst.   HOSPITAL COURSE:  [name] is a 60 year old Caucasian gentleman with  prior history of coronary artery disease, chronic lower extremity edema  and dyspnea who presented with recurrence of dyspnea and lower extremity  edema with a 4-5 pound weight gain over 2-3 weeks.  BNP was only 100 on  initial evaluation.  The patient was admitted for further evaluation and  treated with Zithromax, albuterol nebs and Lasix IV x 1 dose.  CT of the  chest, chest x-ray and lower extremity Dopplers done, results as stated  above.  The patient had improvement in symptoms.  Dr. Antoine Poche in to see  the patient on day of discharge. The  patient is stable.   DISCHARGE INSTRUCTIONS:  1. Patient being discharged home to follow up with Dr. Antoine Poche on      September 24, 2007 at 2:15.  2. Prior to that, he has a pulmonary function study with [facility] on      September 07, 2007 at 4:00 p.m.  3. He is to weigh daily.  4. Continue a low-sodium, heart-healthy diet.  5. Activity as tolerated.  6. If his weight increases 2 pounds in 24 hours, he is to take an      additional 20 mg Lasix tablet and 20 mEq potassium, and call Dr.      Antoine Poche if no improvement.   DISCHARGE MEDICATIONS:  1. Aspirin 81 mg daily.  2. Furosemide 40 mg daily.  3. Potassium 40 mEq daily.  4. Amitriptyline 75 mg 2 tablets at bedtime.  5. Lipitor 80.  6. Methadone as previously prescribed.  7. Lyrica as previously prescribed.  8. Metoprolol 25 b.i.d.  9. Prednisone 10 mg daily.  10.Albuterol inhaler as previously prescribed.  11.Nitroglycerin as needed.  12.The patient has completed his Zithromax antibiotics.   At time of discharge, he is afebrile.  Blood pressure 114/79, heart rate  72, respirations 20, sat 94% on room air.  His weight is 105.8 kg.  Potassium 3.7, BUN 14, creatinine 0.86.   Duration of discharge encounter greater than 30 minutes.      Dorian Pod, ACNP      Rollene Rotunda, MD, [facility]  Electronically Signed    MB/MEDQ  D:  08/28/2007  T:  08/28/2007  Job:  [id number]

## 2011-01-08 NOTE — Assessment & Plan Note (Signed)
[facility]                            CARDIOLOGY OFFICE NOTE   EMETERIO, BALKE                      MRN:          [id number]  DATE:09/01/2008                            DOB:          Jun 17, 1951    PRIMARY CARE PHYSICIAN:  Willow Ora, MD.   REASON FOR PRESENTATION:  Evaluate the patient with coronary disease.   HISTORY OF PRESENT ILLNESS:  The patient returns for yearly followup.  Since I last saw him, he has had somewhat of a difficult time.  He said  he was hospitalized 3 times, once for pneumonia and sepsis, once for  dyspnea, and once for cellulitis of his left second toe.  He is now on  chronic oxygen.  He says he carries diagnoses of COPD and emphysema.  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   Through all of this, he says he has not had any new chest discomfort,  neck discomfort, arm discomfort, activity-induced nausea, vomiting,  excessive diaphoresis.  He has had no palpitations, presyncope, or  syncope.  He denies any PND or orthopnea.   PAST MEDICAL HISTORY:  Coronary artery disease status post CABG (LIMA to  the LAD, left radial to obtuse marginal, SVG to first diagonal, SVG to  PDA.  His ejection fraction is 50-55%), severe rheumatoid arthritis,  psoriasis,  polyneuropathy, emphysema/COPD, previous tobacco abuse (he  has not smoked since his surgery), and previous renal insufficiency.   ALLERGIES:  None.   MEDICATIONS:  1. Prednisone 8.5 mg daily (the weaning is done).  2. Omeprazole 20 mg b.i.d.  3. Amitriptyline 150 nightly.  4. Methadone 10 mg t.i.d.  5. Spiriva.  6. Advair.  7. Vitamin D.  8. Avelox 400 mg daily.  9. Percocet.  10.Oxygen.   REVIEW OF SYSTEMS:  As stated in the HPI and otherwise negative for  other systems.   PHYSICAL EXAMINATION:  GENERAL:  The patient is in no distress.  VITAL SIGNS:  Blood pressure 113/77, heart rate 72 and regular, weight  233 pounds,  and body mass index 30.  HEENT:  Eyelids unremarkable.  Pupils are equal, round, and reactive to  light.  Fundi not visualized.  Oral mucosa unremarkable.  NECK:  No jugular venous distention at 45 degrees.  Carotid upstroke  brisk and symmetric.  No bruits, no thyromegaly.  LYMPHATICS:  No cervical, axillary, or inguinal adenopathy.  LUNGS:  Clear to auscultation bilaterally.  CHEST:  Well-healed sternotomy scar.  HEART:  PMI not displaced or sustained.  S1 and S2 within normal limits.  No S3, no S4.  No clicks, no rubs, no murmurs.  ABDOMEN:  Flat, positive bowel sounds.  Normal in frequency and pitch.  No bruits.  No rebound.  No guarding.  No midline pulsatile mass.  No  hepatomegaly.  No splenomegaly.  SKIN:  No rashes, no nodules.  EXTREMITIES:  Severe rheumatoid arthritic changes; 2+ radial pulse; 1+  dorsalis pedis and posterior tibialis bilaterally.  Trace bilateral  lower extremity edema.  NEURO:  Grossly intact.   EKG:  Sinus rhythm, rate 72, axis within normal limits, intervals within  normal limits, no acute ST-T wave changes.   ASSESSMENT AND PLAN:  1. Coronary disease.  From a coronary disease standpoint, the patient      has done well.  There has been no evidence of ischemia.  No further      cardiovascular testing is suggested.  He will continue with      secondary risk reduction.  2. Dyslipidemia.  I have given him written instructions to get a lipid      profile, and I would be happy to review this.  He has his labs      drawn at Dr. Leta Jungling, and we will have this added at the next time.      The goal will be an LDL less than 100 and HDL greater than 40.  3. Diastolic dysfunction.  This seems to be euvolemic, and he will      continue the meds as listed.  4. Hypertension.  Blood pressure is well controlled.  He will continue      the meds as listed.  5. Followup.  I will see him yearly or sooner if he has any problems.     Rollene Rotunda, MD, [facility]   Electronically Signed    JH/MedQ  DD: 09/01/2008  DT: 09/02/2008  Job #: [id number]   cc:   Willow Ora, MD

## 2011-01-11 NOTE — Letter (Signed)
August 20, 2006     RE:  TRYSTYN, DOLLEY  MRN:  [id number]  /  DOB:  02/06/1951   To whom it may concern:   Mr. Burbach is under my care for coronary artery disease.  He is status  post bypass surgery in August of 2007.  He is going to be followed  chronically with medical management of his coronary disease.  This will  require multiple medications and close followup in our clinic.  He also  has other significant medical illnesses cared for by Dr. Willow Ora.   Thank you for your attention to this gentleman.  If you have any  questions, please call my office at [phone].    Sincerely,      Rollene Rotunda, MD, [facility]  Electronically Signed    JH/MedQ  DD: 08/20/2006  DT: 08/20/2006  Job #: [id number]

## 2011-01-11 NOTE — Assessment & Plan Note (Signed)
[facility]                              CARDIOLOGY OFFICE NOTE   Joseph, [name]                      MRN:          [id number]  DATE:05/19/2006                            DOB:          Sep 24, 1950    REASON FOR PRESENTATION:  Patient with recent coronary artery bypass graft.   HISTORY OF PRESENT ILLNESS:  Patient is a pleasant 60 year old gentleman who  presented with questionable pneumonia.  He was subsequently found to have a  non-Q-wave myocardial infarction.  He did give a history of chest discomfort  which was thought possibly to be GI.  He underwent cardiac catheterization  and was demonstrated to have severe two vessel disease with a total  occlusion of his LAD and diagonal and 95% stenosis of the marginal.  Patient  subsequently CABG with a LIMA to the LAD, left radial to the obtuse marginal  one, saphenous vein  graft to the first diagonal and saphenous vein  graft  to the PDA.  His EF was 50 to 55%.  His course was somewhat complicated by  slow recovery probably related in part to his rheumatoid arthritis as well  as severe lung disease.  He had delirium as well.  These symptoms eventually  resolved.   At home he has had some problems with wakening up in the middle of the night  with dyspnea.  He has a home health nurse who came to see him after one of  these episodes and he was said to have clear lungs.  The symptoms resolved.  He does not seem to get breathless during the day.  He is restless at night.  He is not describing PND or orthopnea classically.  He has not been having  any chest discomfort other than some mild incisional pain.  He has no  palpitations, presyncope or syncope.  He has had persistent lower extremity  swelling and was recently started on diuretics and potassium.  He has had  none of the chest discomfort that prompted his evaluation.   PAST MEDICAL HISTORY:  1. Coronary artery disease as described.  2.  Severe rheumatoid arthritis.  3. Psoriasis.  4. Polyneuropathy.  5. COPD.  6. Previous tobacco use (quit at the time of admission).  7. Renal insufficiency.   ALLERGIES:  None.   MEDICATIONS:  1. Folic Acid.  2. Furosemide 40 mg a day.  3. Klor-Con 20 mEq a day.  4. Methotrexate.  5. Aspirin.  6. Prednisone 10 mg a day.  7. Omeprazole.  8. Lyrica.  9. Metoprolol.  10.Amitriptyline.  11.Niferex.  12.Lipitor 80 mg q.h.s..   REVIEW OF SYSTEMS:  Review of systems as stated in the HPI.  Positive for  continued incisional pain and some anxiety.  Otherwise negative for other  systems.   PHYSICAL EXAMINATION:  GENERAL:  The patient is in no distress.  The exam  was while he was seated.  VITAL SIGNS:  Blood pressure 102/73, heart rate 87 and regular, weight 211  pounds.  HEENT:  Eyes unremarkable.  Pupils equal, reactive to light.  Fundi not  visualized.  Oral mucosa unremarkable.  NECK:  No jugular venous distention at 90 degrees.  Carotid upstrokes brisk  and symmetrical.  No bruits or thyromegaly.  LYMPHATICS:  No cervical, axillary, or inguinal adenopathy.  LUNGS:  Lungs clear to auscultation without wheezing or crackles.  BACK:  No costovertebral angles or tenderness.  CHEST:  Well healed sternotomy scar without sternum mobility, drainage, or  erythema.  HEART:  PMI nondisplaced or sustained.  S1, S2 within normal limits.  No S3.  No murmurs.  No rubs.  ABDOMEN:  Mildly obese, positive bowel sounds, normal frequency and pitch.  No bruits, no rebound, no guarding,  no midline pulsatile masses.  No  organomegaly.  SKIN:  No rashes.  EXTREMITIES:  2+ right radial, absent left radial with a well healed left  radial harvest site.  2+ pulses throughout.  Moderate bilateral lower  extremity edema to above his ankles.  Well healed left saphenous vein  graft  harvest site.  NEURO:  Oriented to person, place and time.  Cranial nerves II-XII grossly  intact.  Motor grossly  intact.   EKG; sinus rhythm, rate __________, axis within normal limits.  Intervals  are within normal limits.  No acute ST wave changes.   ASSESSMENT/PLAN:  1. Coronary artery disease.  The patient is having no symptoms related to      this.  He is having some usual postoperative symptoms.  I have taken      the liberty of prescribing his Hydrocodone for one more month.  No      further cardiovascular testing is suggested at this point.  2. Edema.  The patient does have significant lower extremity edema.  I      have renewed his prescription for Lasix and potassium.  He is going to      come back and see me next week and I will check a BMET at that time.  I      did teach him and his wife how to put his feet over his heart, which      they have not been doing.  Hopefully these conservative measures with      the low dose diuretic will improve this slowly.  3. Dyspnea.  The patient had these episodes of dyspnea.  He does not seem      to be volume overloaded.  He had a well preserved ejection fraction.      He does admit to getting anxious and I took the liberty of giving him      Xanax 0.5 mg q-8 hours p.r.n., one month supply only.  4. I have established him with Dr. Drue Novel as a new primary care doctor to      address his noncardiac issues.  I will also see him back in a week and      get a chest x-ray at that point as well as labs.                               Rollene Rotunda, MD, [facility]    JH/MedQ  DD:  05/19/2006  DT:  05/21/2006  Job #:  [id number]   cc:   Willow Ora, MD  Salvatore Decent. Dorris Fetch, M.D.

## 2011-01-11 NOTE — Assessment & Plan Note (Signed)
[place] [facility]                          [place] OFFICE NOTE   ANIRUDH, BAIZ                      MRN:          [id number]  DATE:06/09/2006                            DOB:          05-Jan-1951    CHIEF COMPLAINT:  Here to establish.   HISTORY OF PRESENT ILLNESS:  [name] is a 60 year old gentleman with  multiple medical problems who presented to [facility] in August 2007  complaining of dizziness and confusion.  EMS brought him to the emergency  room.  He was admitted to the hospital.  While in the hospital, he had an  acute MI.  This was followed by cardiac catheterization, and during the same  admission, urgent CABG.  This was complicated by delirium for 10 days and  also by a generalized, and apparently severe, rash that included peeling of  his skin.  The reason for the rash is unclear to the patient.  The doctors  who took care of him at that time wonder about this being a reaction to the  antibiotics.  At this point, he follows up with Dr. Antoine Poche for his  cardiology needs, and he needs a primary care doctor.  He is currently doing  some physical therapy and feeling better.  He will start cardiac  rehabilitation at [facility] soon.   PAST MEDICAL HISTORY:  1. Coronary artery disease as stated above.  Prior to this event, he did      not have heart disease that he knew of.  2. Long history of rheumatoid arthritis, diagnosed in the late 80s.  He      followed up with Dr. Luster Landsberg at [facility], but he would like a local      rheumatologist.  3. Peripheral neuropathy.  4. COPD.  5. Psoriasis.  6. Tonsillectomy.   FAMILY HISTORY:  1. Father had a heart attack twice and a CABG.  He still lives.  His first      heart attack was at age 89.  2. No history of diabetes, or prostate or colon cancer.   SOCIAL HISTORY:  The patient smokes a pack a day since approximately age 66.  He quit since he was admitted to [facility] in August.  He does not  drink.  He is married and has no children.   REVIEW OF SYSTEMS:  In general he thinks he has improved quite a bit.  Denies any fever.  He does have constant left-sided chest pain, but now it  is better.  He had quite a bit of edema.  He was placed on Lasix and now he  is much better.  His Lasix actually has been cut down in half by his  cardiologist.  He is still a little bit short of breath, but denies any  wheezing or coughing.  As far as the shortness of breath he actually stated  that it has improved so much that he thinks he is in better shape now than  he was prior to the heart attack.  He denies any difficulty urinating.  He  admits to having some stress due to his recent health problems.   MEDICATION LIST:  1. Xanax 0.5 q.8 hours p.r.n.  2. Niferex 150 twice a day.  3. Potassium 20 one p.o. q. day.  Today I am actually decreasing this dose      to half p.o. which is 10 mEq a day.  4. Lorcet 7.5 as needed.  5. Lipitor 80 one p.o. q. day.  6. Lasix 40 one p.o. q. day.  His dose was recently changed to 20.  7. Prednisone 10 one p.o. q. day.  8. Oxycodone p.r.n.  9. Vitamin B tablets daily.  10.Omeprazole 20 one p.o. q. day.  11.Lyrica 150 three times a day.  12.Folic acid.  13.Methotrexate 2.5 six tablets every Thursday.  14.Metoprolol 25 one p.o. b.i.d.  15.Aspirin 81 one p.o. q. day.  16.Elavil 75 one at bedtime.   ALLERGIES:  No known drug allergies.  Noting that he may have had a reaction  to an antibiotic while he was at [facility].   PHYSICAL EXAM:  The patient is alert, oriented, in no apparent distress.  Weight 210 pounds.  Blood pressure 120/90, pulse 92, respiration 18.  NECK:  No lymphadenopathy.  LUNGS:  Basically clear to auscultation.  CARDIOVASCULAR:  Regular rate and rhythm without a murmur.  CHEST:  Wound seems to be healing nicely.  I see no redness or discharge.  ABDOMEN:  Not distended.  Soft.  Good bowel sounds.  No  organomegaly.  EXTREMITIES:  No peripheral edema.  The left calf seems to be hypotrophic  and smaller than the right 1.  At the hands he has severe changes consistent  with a long history of rheumatoid arthritis.   LABORATORY AND X-RAYS:  His potassium was checked on October 4 and it was  3.9 with a creatinine of 0.9.  Hemoglobin was checked today and was 13.3.   ASSESSMENT AND PLAN:  1. The patient has coronary artery disease status post coronary artery      bypass grafting and recovering.  I went ahead and refilled his      metoprolol.  2. History of high cholesterol.  He is now on Lipitor 80.  We need to      check his liver enzymes, particularly because he also takes      methotrexate.  3. Edema.  This seems to be better.  His current dose of Lasix is 20, so I      am going to also cut down the Lasix to half.  4. Rheumatoid arthritis.  He is to continue with prednisone.  I refilled      his medications and I am going to refer him to 1 of our local      rheumatologists.  Will check a CBC to monitor for complications of      methotrexate.  5. He was given the flu and the pneumonia shots.  We discussed with the      wife the need to watch for reaction, particularly fever or flu      symptoms.  If that happens they are to let me know.  6. I asked him to come back in 6 weeks for a routine visit.  7. I have a number of records to review in reference to this patient.      This is a level IV office visit.       Willow Ora, MD      JP/MedQ  DD:  06/09/2006  DT:  06/10/2006  Job #:  [id number]

## 2011-01-11 NOTE — Procedures (Signed)
EEG NUMBER:  [id number].   HISTORY OF PRESENT ILLNESS:  Patient is a 60 year old with lethargy and  restlessness who is having an EEG done to evaluate for seizure activity.   PROCEDURE:  This is a portable EEG.   TECHNICAL DESCRIPTION:  Throughout this portable electroencephalogram there  is posterior dominant rhythm of 8 to 8.5 Hz activity at 30-40 Mv. Background  activity is symmetric, most comprised of theta range activity at 25-40 Mv.  There is trending down of the electroencephalogram movement artifact that  occasionally obscures the background.   Photic stimulation nor hyperventilation performed throughout this recording.   The patient does not go to sleep during this tracing.  Throughout this  record there is no evidence of epileptiform activity.   IMPRESSION:  This routine electroencephalogram is mildly abnormal secondary  to mild diffuse slowing.  The diffuse slowing is suggestive of a toxic  metabolic or primary neuronal disorder.  Clinical correlation is  advised.      Bevelyn Buckles. Nash Shearer, M.D.  Electronically Signed     ZOX:WRUE  D:  04/29/2006 18:53:24  T:  04/30/2006 09:28:46  Job #:  [id number]

## 2011-01-11 NOTE — Assessment & Plan Note (Signed)
[facility]                              CARDIOLOGY OFFICE NOTE   Joseph [name], Joseph [name]                      MRN:          [id number]  DATE:05/29/2006                            DOB:          September 07, 1950    PRIMARY CARE PHYSICIAN:  Dr. Willow Ora.   REASON FOR PRESENTATION:  Evaluate patient with coronary disease, status  post CABG.   HISTORY OF PRESENT ILLNESS:  The patient returns for followup of the above.  At the last visit he was just out of the hospital from his surgery.  He had  severe lower extremity edema.  I put him on 40 mg of Lasix as well as  potassium.  He came back today, and he has been keeping his feet elevated.  His edema is resolved.  He will be getting a BMET today.   He is having some mild incisional chest discomfort.  However, he has had  some discomfort in his left side, left axillary.  This has been constant and  he does not describe it as sharp or dull.  It does not decrease or increase  with movement or respiration.  He has not had discomfort like this before.  It is not like previous anginal pain.  He has been active and moving around,  and has had no calf tenderness or swelling.  He has had no fevers, chills or  cough.  He said the pain has been there for about 6 days.  He says he is  breathing well, and has no shortness of breath, PND or orthopnea.   PAST MEDICAL HISTORY:  1. Coronary artery disease, status post CABG with a LIMA to the LAD, left      radial to obtuse marginal, SVG first diagonal, and SVG to PDA.  His EF      is 50% to 55%.  2. Severe rheumatoid arthritis.  3. Psoriasis.  4. Polyneuropathy.  5. COPD.  6. Previous tobacco use (quit at the time of admission).  7. Previous renal insufficiency.   ALLERGIES:  None.   MEDICATIONS:  1. Folic acid.  2. Furosemide 40 mg a day.  3. Klor-Con 20 mEq a day.  4. Methotrexate.  5. Aspirin.  6. Prednisone.  7. Omeprazole.  8. Lyrica.  9. Metoprolol 25 mg  b.i.d.  10.Amitriptyline.  11.Niferex.  12.Lipitor 80 mg nightly.  13.Multivitamin.  14.Xanax.   REVIEW OF SYSTEMS:  As stated in the HPI, and otherwise negative for other  systems.   PHYSICAL EXAMINATION:  The patient is in no acute distress.  Blood pressure  100/68, heart rate 86 and regular, weight 213 pounds, body mass index 27.  HEENT:  Eyelids unremarkable, pupils equal, round and reactive to light,  fundi not visualized, oral mucosa unremarkable.  NECK:  No jugular venous distention at 45 degrees, carotid upstroke brisk  and symmetric, no bruits, no thyromegaly.  LYMPHATICS:  No adenopathy.  LUNGS:  Clear to auscultation bilaterally.  BACK:  No costovertebral angle tenderness.  CHEST:  Well healed sternotomy scar, no tenderness.  HEART:  PMI not displaced or sustained,  S1 and S2 within normal limits, no  S3, no S4, no murmurs.  ABDOMEN:  Flat, positive bowel sounds, normal in frequency and pitch, no  bruits, no rebound, no guarding, no midline pulsatile mass, no hepatomegaly,  no splenomegaly.  SKIN:  No rashes, no nodules.  EXTREMITIES:  2+ pulses throughout, no edema, severe arthritic changes in  his hands predominantly.  NEUROLOGIC:  Oriented to person, place and time, cranial nerves II-XII  grossly intact, motor grossly intact throughout.   EKG sinus rhythm, rate 86, axis within normal limits, intervals within  normal limits, poor anterior R wave progression, nonspecific anterior T wave  flattening, no acute ST-T wave changes.   ASSESSMENT AND PLAN:  1. Coronary disease.  The patient is recovering reasonably well, given his      previous problems from his bypass surgery.  No further cardiovascular      testing is suggested.  2. Chest pain.  He has no objective evidence of ischemia.  He has been      very ambulatory, and has had no problems with leg swelling.  The pre-      test probability of the pulmonary embolism, I think, is very low.  This      sounds more  like a musculoskeletal pain.  He has lots of arthritis that      might contribute to this.  He has had improvement with oxycodone in the      past.  I am going to give him 1 to 2 by mouth a day with #60 pills and      no refills.  Because of multiple pains in the past, I do think he could      follow up with a pain clinic, but will defer to Dr. Drue Novel, who is going      to see him as a new patient soon.  3. Lower extremity swelling.  I am going to have him cut back the Lasix 20      mg a day.  He is going to get a BMET today.  We will watch his renal      function carefully.  4. Chest pain as above.  5. Followup.  He is going to see Dr. Drue Novel next week.  I have asked him to      talk to Dr. Drue Novel about pain management and the pain clinic referral      potentially.  I have also asked him to talk to      Dr. Drue Novel about continuing any Xanax, which could help his sleep.  He is      going to continue his other medications and see me back in about 3      months.            ______________________________  Rollene Rotunda, MD, [facility]     JH/MedQ  DD:  05/29/2006  DT:  05/31/2006  Job #:  [id number]   cc:   Willow Ora, MD

## 2011-01-11 NOTE — Assessment & Plan Note (Signed)
[facility]                                 ON-CALL NOTE   Joseph [name], Joseph [name]                      MRN:          [id number]  DATE:07/21/2006                            DOB:          [birth year]    A patient of Dr. Leta Jungling.  Yaw. with [facility], called from [phone] at  6:05 p.m. on July 21, 2006, saying that Dr. Drue Novel had written for  Avelox but had not put a number of pills or how long he wanted him to  take it.  I told him to go ahead and give him 10 pills for 1 a day for  10 days.  The other problem was that the cough medicine was unavailable,  so we changed it to guaifenesin and hydrocodone 3.5/300 one teaspoon 4  times a day as needed and if the patient has any problems with these, he  is to call Dr. Drue Novel back.     Lelon Perla, DO  Electronically Signed    Shawnie Dapper  DD: 07/21/2006  DT: 07/22/2006  Job #: [id number]   cc:   Willow Ora, MD

## 2011-01-11 NOTE — Consult Note (Signed)
Joseph [name], Joseph [name] NO.:  [id number]   MEDICAL RECORD NO.:  [id number]          PATIENT TYPE:  INP   LOCATION:  2111                         FACILITY:  [facility]   PHYSICIAN:  Audery Amel, MD    DATE OF BIRTH:  [birth year]   DATE OF CONSULTATION:  04/18/2006  DATE OF DISCHARGE:                                   CONSULTATION   PRIMARY CARE PHYSICIAN:  Oley Balm. Sung Amabile, M.D.   CHIEF COMPLAINT:  Chest pain.   HISTORY OF THE PRESENT ILLNESS:  The patient is a 60 year old white male  with a past medical history notable for rheumatoid arthritis who presents  with approximately a one week history of general malaise and chest  discomfort.  The patient and his wife recently returned from [place] on  the [place] at which time he was in his usual state of health  approximately one week prior to this presentation.  Over this past week the  patient states that he has begun to feel poorly and has had intermittent  chest discomfort.  The patient had similar symptoms back in 2005 at which  time he underwent a cardiac workup, which was negative.  Unfortunately I do  not have the details of this workup; however, there was an echocardiogram  performed, which revealed a normal ejection fraction and no evidence of  structural heart disease.  The patient characterizes his pain as a pressure,  which extends across his entire anterior  chest wall.  There is no radiation  into the arm, neck or jaw.  The pain is associated with some mild shortness  of breath, but he denies any nausea, vomiting, diaphoresis or palpitations.  Interestingly, the patient states that he suffers from frequent paroxysmal  nocturnal dyspnea.  He has recently undergone a full evaluation at [facility]; however, I do not have those records available for review and  the patient is unclear as to what tests were performed.   Upon arrival to the emergency department the patient was somewhat  hypotensive in the 90s over 60s range and tachycardiac to 116.  His O2 sats  were low and the patient was initiated on supplemental oxygen with  significant improvement in symptomatology.  A chest x-ray was obtained,  which revealed a question of an early infiltrate versus atelectasis into the  right upper lobe.  His initial EKG revealed normal sinus rhythm with  nonspecific ST-T wave changes.   While in the emergency department the patient again developed chest  discomfort and serial EKGs were obtained the last of which revealed diffuse  ST depression in the anterior, inferior and lateral distributions.  His  initial cardiac biomarkers are negative and the patient has had no prior  history of coronary disease.  Also of note the patient has a white blood  cell count of 19,000 in addition to a serum creatinine of 2.6, which is a  significant change from his baseline.  The patient denies any recent sick  contacts, fevers, chills and other constitutional symptoms; otherwise, he is  without complaints.   PAST MEDICAL  HISTORY:  1. Rheumatoid arthritis.  2. Peripheral neuropathy.  3. Hypertension.  4. Emphysema.   ALLERGIES:  No known drug allergies.   CURRENT MEDICATIONS:  1. Albuterol Nebs q.6 hours.  2. Aspirin 325 mg daily.  3. Atorvastatin 80 mg daily.  4. Hydrocortisone 100 mg q.8 hours.  5. Atrovent Nebs q.6 hours p.r.n.  6. Protonix 40 mg IV q.12 hours.  7. Heparin drip.  8. Azithromycin 500 mg IV daily.  9. Ceftriaxone 1 gram IV q.24 hours.  10.Dopamine drip.   SOCIAL HISTORY:  The patient lives in [place] with his wife and is disable  secondary to rheumatoid arthritis.  He quit smoking several years ago as  well as alcohol use.  He denies any illicit substance use.   FAMILY HISTORY:  The patient's family history is notable for coronary artery  disease in his father who is status post CABG and is still alive and well at  the age of 24, otherwise it is  noncontributory.   REVIEW OF SYSTEMS:  CONSTITUTIONAL:  The patient denies any fevers, chills,  sweats, weight changes or adenopathy.  HEENT:  The patient denies any  headache, sore throat, nasal discharge, or change in visual or auditory  acuity.  INTEGUMENTARY:  The patient denies any rashes or lesions.  CARDIOPULMONARY:  As per HPI.  The patient does endorse the [employer] Heart  Association __________ .  GENITOURINARY:  No frequency, urgency or dysuria.  NEUROPSYCHIATRIC:  The patient has severe peripheral neuropathy and  therefore it is difficult to gauge strength.  MUSCULOSKELETAL:  The patient  has constant myalgias and arthralgias.  There is no joint deformity or  effusion noted.  GASTROINTESTINAL:  No nausea, vomiting diarrhea, bright red  blood per rectum, melena, dysphagia, GERD, or change in bowel habits,  ENDOCRINE:  No polyuria.  No polydipsia.  No heart or cold intolerance.  All  other review of systems is negative.   PHYSICAL EXAMINATION:  VITAL SIGNS:  Blood pressure is 104/52, heart rate is  86, O2 sat  100% on 40% [place], and the patient is afebrile.  GENERAL APPEARANCE:  In general he is alert and oriented times three and in  mild distress; otherwise, pleasantly conversant.  HEENT:  Normocephalic and atraumatic.  Extraocular muscles are intact.  Pupils are equal, round and react to light.  Nares patent.  Oropharynx is  clear without erythema or exudate.  NECK:  The neck is supple with full range of motion.  There is moderate JVD.  Carotid upstrokes are equal and symmetric bilaterally without an audible  bruit.  LYMPHATICS:  Lymphadenopathy; none palpable.  HEART:  Cardiovascular exam shows tachycardia, S1 and S2 without audible  murmurs, rubs or gallops.  His PMI is nondisplaced in the left midclavicular  line.  SKIN:  The skin reveals no rashes or lesions.  ABDOMEN:  The abdomen is soft, nontender and nondistended with positive  bowel sounds, and no palpable  hepatosplenomegaly. GENITALIA:  Genitourinary exam shows normal male genitalia.  EXTREMITIES:  There is trace bilateral lower extremity edema.  Otherwise no  lesions or petechiae are noted.  MUSCULOSKELETAL:  The patient has multiple findings consistent with  rheumatoid arthritis most notable ulnar deviation of the upper extremity  digits.  NEUROLOGIC EXAMINATION:  Cranial nerves II-XII are grossly intact.  The  patient has decreased sensation in the bilateral lower extremities; however,  his strength appears to be reasonably intact.   LABORATORY DATA:  Chest x-ray showed a questionable infiltrate  versus  atelectasis within the right upper lobe.  EKG showed normal sinus rhythm  with diffuse ST depression.   Labs:  White count 19.0, hematocrit 30 and platelet count 231,000.  Sodium  131, potassium 4.0, chloride 102, CO2 19, BUN 32, creatinine 2.6, and  glucose 124.  CK/MB 3.5 and troponin I less than 0.05.   IMPRESSION:  The patient is a 60 year old white male with severe rheumatoid  arthritis presenting for further evaluation of chest discomfort and  generalized systemic illness.   PLAN:  On my examination the patient does reveal physical findings  consistent with elevated filling pressures; however, he does not appear to  be volume overloaded as evidenced by the lack of peripheral edema.  There  are bibasilar crackles; however, his chest x-ray does not reveal overt  pulmonary edema.  There is a question of an infiltrate versus atelectasis in  the right upper lobe and his cardiac silhouette is mildly enlarged; however,  there are no pleural effusions.  He does provide a history that would be  concerning for potential cardiomyopathy.  In his particular case he is at  risk for constrictive pericarditis or restrictive cardiomyopathy given his  rheumatoid arthritis.  In addition, he is at increased risk for coronary  artery disease given his rheumatoid arthritis and positive family  history of  a father who suffered his first myocardial infarction in his 55s.  On  evaluation of the electrocardiogram the ST depression is somewhat diffuse  and not localized to any one particular segment, which may very well  represent demand ischemia in the setting of a general systemic illness and  being hypertensive.  The patient leads a relatively sedentary lifestyle due  to his rheumatoid arthritis and therefore pulmonary embolus certainly must  be considered in the differential diagnosis.  He is currently being  anticoagulated with plans for V/Q scanning to rule out this possibility.  The patient is on chronic prednisone therapy to treat his rheumatoid  arthritis; and, I agree with stress dose steroids in the setting of this  acute illness.  He is being treated with IV antibiotics for a possible right  upper lobe infiltrate.   Continue to cycle his cardiac biomarkers to rule out myocardial infarction;  however, I suspect that if he has a cardiac etiology to his presentation it is likely to be cardiomyopathy.  Patients with rheumatoid arthritis as  previously mentioned are certainly at risk for constrictive pericarditis and  restrictive cardiomyopathy, which includes infiltration with rheumatoid  nodes and/or amyloid.   I would recommend that we check another transthoracic echocardiogram to  evaluate his left ventricular function and structure.  He appears to be  euvolemic by exam with elevated filling pressures which may represent  constriction vs restriction.  Given his elevated creatinine and the lack of  generalized edema, I would avoid additional diuresis at this point as well  as ACE inhibitors.  Pending the outcome of these initial studies we will  determine the need for further cardiac evaluation.   We appreciate the opportunity to participate in the care of your patient and  we will follow along with you during his hospital course.      Audery Amel, MD   Electronically Signed     SHG/MEDQ  D:  04/19/2006  T:  04/19/2006  Job:  [id number]

## 2011-01-11 NOTE — Cardiovascular Report (Signed)
Joseph [name] NO.:  [id number]   MEDICAL RECORD NO.:  [id number]          PATIENT TYPE:  INP   LOCATION:  2111                         FACILITY:  [facility]   PHYSICIAN:  Bevelyn Buckles. Bensimhon, MDDATE OF BIRTH:  [birth year]   DATE OF PROCEDURE:  04/22/2006  DATE OF DISCHARGE:                              CARDIAC CATHETERIZATION   PRIMARY CARE PHYSICIAN:  [facility].   CARDIOLOGIST:  Rollene Rotunda, MD,   PATIENT IDENTIFICATION:  Joseph [name] is a 60 year old male with a history of  hypertension ongoing tobacco use and rheumatoid arthritis for which he is  maintained on chronic prednisone therapy.  He was admitted with pneumonia in  apparent septic shock.  In the process of his hospitalization, he ruled in  for non-ST elevation myocardial infarction with significant ST depression on  his EKG.  He has recovered from his infection; and is now brought to the  catheterization lab for diagnostic angiography.   PROCEDURES PERFORMED:  1. Selective coronary angiography.  2. Left heart catheterization.  3. Left ventriculogram.  4. Abdominal aortogram.  5. Right heart catheterization.   DESCRIPTION OF PROCEDURE:  The risks and benefits of catheterization were  explained; consent was signed, and placed on the chart.  A 6-French arterial  sheath was placed in the right femoral artery using a modified Seldinger  technique.  We did have a moderate amount of difficulty getting the wire  through the femoral and iliac system; and ended up using a Wholey wire.  Standard catheters including a JL-4, a JR-4, and angled pigtail were used  for the procedure.  There are no apparent complications.  A 7-French venous  sheath was also placed in the right femoral vein.  A standard right heart  catheterization was performed with a Swan-Ganz catheter.   HEMODYNAMICS:  Right atrial pressure was a mean of 14, RV pressure 48/11.  PA pressure 45/27 with a mean of 35.   Pulmonary capillary wedge pressure was  a mean of 26.  Central aortic pressure was 147/91 with a mean of 117.  LV  pressure is 151/18 with an EDP of 30.  Aortic saturation was 95% on room  air.  Pulmonary artery saturation was 67% on room air and 68% on room air.  Fick cardiac output 6.3 liters per minute.  Cardiac index was 3.2 liters per  minute per meter squared.  Pulmonary vascular resistance is 1.5 Woods units.   1. Left main had some mild distal irregularity.  2. LAD was a diffusely diseased vessel.  There was a 90% proximal lesion      after the takeoff of a small to moderate 1st diagonal first diagonal.      There was diffuse 60-70% disease throughout the midsection; and then      the LAD was totaled in the midsection after the takeoff of a large      septal perforator and apparently large 2nd diagonal.  The distal LAD      was seen filling faintly via collaterals.  The first and second      diagonals were  totally occluded proximally and filled faintly from      collaterals.  3. The left circumflex was a very large dominant system.  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  There was a 40% ostial lesion in the left circumflex.  A 40-      50% lesion in the mid section prior to the take off of the large OM-1;      and a 30% lesion in the midsection just after the takeoff of the OM-1.      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  The distal upper branch was      widely patent.  There was faint filling of a small lower branch after      the aneurysm.  4. Right coronary artery was a small nondominant vessel with an 80% lesion      in the midsection.   LEFT VENTRICULOGRAM:  Showed showed an EF of proximally 50-55% with some  anterior hypokinesis.  There was no obvious mitral regurgitation.  Abdominal  aortogram revealed apparently patent renal arteries  bilaterally.  However,  the right renal had an overlapping vessel; and was unable to be visualized  clearly.  The distal abdominal aorta opacified well.   ASSESSMENT:  1. Severe 2-vessel coronary disease in a left dominant system as described      above.  2. Low normal LV function with mild anterior hypokinesis.  3. Elevated filling pressures with moderate pulmonary venous hypertension      (with normal pulmonary vascular resistance).   PLAN/DISCUSSION:  1. Consult CVTS for a possible bypass surgery.  2. Gentle diuresis.  3. Continue risk factor management.      Bevelyn Buckles. Bensimhon, MD  Electronically Signed     DRB/MEDQ  D:  04/22/2006  T:  04/22/2006  Job:  [id number]

## 2011-01-11 NOTE — Consult Note (Signed)
NAMESLAYTER, MOORHOUSE               ACCOUNT NO.:  [id number]   MEDICAL RECORD NO.:  [id number]          PATIENT TYPE:  INP   LOCATION:  2301                         FACILITY:  [facility]   PHYSICIAN:  Casimiro Needle L. Reynolds, M.D.DATE OF BIRTH:  1951-06-06   DATE OF CONSULTATION:  DATE OF DISCHARGE:                                   CONSULTATION   INPATIENT CONSULTATION   DATE OF BIRTH:  [birth year]   DATE OF EVALUATION:  April 25, 2006.   REQUESTING PHYSICIAN:  Dr. Charlett Lango.   REASON FOR EVALUATION:  Delirium.   HISTORY OF PRESENT ILLNESS:  This is an inpatient consultation evaluation of  this existing [facility] patient.  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  He was admitted to [facility] on April 18, 2006 with a history of malaise and chest discomfort.  He presented to the emergency room, hypotensive and tachycardiac.  EKG  revealed a questionable left upper lobe infiltrate.  Initial EKG was  unremarkable but he subsequently developed ST depression in the anterior,  inferior, and lateral leads and he thereafter ruled in by enzymes for MI.  He was treated with aggressive hydration, anticoagulation, and empiric Zosyn  and his symptoms improved dramatically.  Of note, his creatinine was also  elevated on admission but this came down rapidly and his white count on  admission was 19 but this also rapidly improved.  He was not febrile  throughout the first part of hospitalization.  His  shock was ultimately  described as a combination of dehydration and cardiogenic source.  He  underwent the cardiac catheterization on April 22, 2006 and was found to  have critical two-vessel disease.  He was seen by CVTS that night and  because of his severe two-vessel level of disease and  the fact that he was  having ongoing chest pain even after his cath, he underwent a CABG x4 on  April 23, 2006.  He was noticed yesterday after waking up a little bit to  be confused and required restraints and heavy doses of Haldol and Ativan  throughout the day.  This was continued during the day today.  He did  undergo CT of the head which I have had the opportunity to review and a  neurology consultation was subsequently requested.  Presently, a sitter is  at the bedside and the patient, himself is not able to provide any history.   PAST MEDICAL HISTORY:  Remarkable for the history of rheumatoid arthritis as  above.  Also he has a history of psoriasis, COPD, ongoing tobacco abuse, and  gastroesophageal reflux disease.   FAMILY/SOCIAL/REVIEW OF SYSTEMS:  As outlined in the admission H&P of April 18, 2006 which was reviewed as well as in the previous consults notes which  are reviewed.   MEDICATIONS:  Prior to admission his medications included:  1. Avapro.  2. Prednisone 10 mg daily.  3. Lyrica  150 mg t.i.d.  4. Amitriptyline 150 mg b.i.d.  5. Methotrexate.  6. HCTZ.  7. Prilosec.  8. Folate.  9. Multivitamin.  10.Vitamin C.  11.Albuterol p.r.n.  Of note this is obtained from the admission H&P and the unusually high dose  of amitriptyline is confirmed by notes from our office.   PHYSICAL EXAMINATION:  VITAL SIGNS:  Temperature 101.4, blood pressure  130/80, pulse 120, respirations 22, O2 sat 100% on BiPAP 12/6 with a FIO2 of  70%.  GENERAL EXAMINATION:  This is an ill appearing man supine in the hospital  bed, in no acute distress.  HEENT:  Head:  Cranium normocephalic, atraumatic.  Oropharynx is benign.  NECK:  Supple without carotid or supraclavicular bruits.  HEART:  Regular rate and rhythm without murmurs.  Median sternotomy scar as  noted.  NEUROLOGIC EXAM:  Mental status:  He is drowsy, somewhat sedated after  receiving medications.  He arouses briefly to  persistent stimuli but does  not really interact with the examiner.  He does not follow commands.  He  makes a few grunting sounds but does not really produce any speech.  Cranial  nerves:  Pupils are small but reactive.  He looks to the left and the right  spontaneously.  He has corneal, cephalic, and gag reflexes.  Sensory/motor:  Normal bulk and tone.  He is able to move all extremities spontaneously but  formal strength testing is quite difficult and he does seem to have at least  antigravity strength in all muscles.  He also withdraws in all extremities  to pain vigorously.  Reflexes 2+ and symmetric, toes up on the right and  down on the left.  Cerebellar and gait are deferred.   LABORATORY REVIEW:  CBC from this evening:  A white count 18.6, hemoglobin  8.4, platelets 187,000.  BMP from this evening remarkable for an elevated  glucose of 128, BUN and creatinine are 20 and 1.1 respectively.  Urinanalysis from yesterday is negative.  Blood gas from this evening:  PA  7.49, PCO2 32, PO2 190.  Chest  x-rays over the last few days have shown  varying degrees of edema.  CT of the head performed earlier this evening is  personally reviewed and I would agree the study is not remarkable for any  definite acute finding.   IMPRESSION:  Delirium status post CABG in a man with a history of rheumatoid  arthritis status post steroid-dependence and immunosuppression.  Primary  differentials would include multiple atheroembolic strokes and an infectious  etiology.  Of note, his white cell count which had trended down until his  surgery, has been very high since then; raises the possibility of infectious  source although he is also on steroids.  He has also persistent fevers since  his CABG however, there is no obvious source given that his chest x-ray and  urine look clean.  Other more remote possibilities include medication such as the steroids themselves or possibly the Lyrica or possibly  Wernicke's.  He is also not receiving amitriptyline at present but I do not think he is  having withdrawal syndrome from that.  No evidence of seizure.   RECOMMENDATIONS:  We will continue p.r.n. Haldol and try to avoid Ativan as  much as possible.  When he is able to take p.o. would also add scheduled  h.s. doses of Risperdal 0.5 mg and taper off to reduce psychosis and  increase his sleep-wake cycle.  He needs daily thiamine therapy.  If his  symptoms persist we would recommend an  MRI when it is considered safe from a post surgical standpoint.  I would  also watch him very closely from an infectious standpoint and would have a  low threshold for pursuing a aggressive infectious workup including  consideration of lumbar puncture.  We will follow.  Thank you for the  consultation.      Michael L. Thad Ranger, M.D.  Electronically Signed     MLR/MEDQ  D:  04/25/2006  T:  04/26/2006  Job:  [id number]

## 2011-01-11 NOTE — Op Note (Signed)
NAMEJAVIS, ABBOUD               ACCOUNT NO.:  [id number]   MEDICAL RECORD NO.:  [id number]          PATIENT TYPE:  INP   LOCATION:  2301                         FACILITY:  [facility]   PHYSICIAN:  Salvatore Decent. Dorris Fetch, M.D.DATE OF BIRTH:  1951-04-20   DATE OF PROCEDURE:  04/23/2006  DATE OF DISCHARGE:                                 OPERATIVE REPORT   PREOPERATIVE DIAGNOSIS:  Severe 2-vessel disease status post myocardial  infarction.   POSTOPERATIVE DIAGNOSIS:  Severe 2-vessel disease status post myocardial  infarction.   PROCEDURES:  1. Median sternotomy.  2. Extracorporeal circulation.  3. Coronary artery bypass grafting x4 (left internal mammary artery to      LAD, left radial artery to obtuse marginal-1, saphenous vein graft to      first diagonal, saphenous vein graft to left posterior descending).  4. Open radial artery harvest.  5. Endoscopic vein harvest of the left thigh.   SURGEON:  Salvatore Decent. Dorris Fetch, M.D.   ASSISTANT:  Sheliah Plane, MD   SECOND ASSISTANT:  Rowe Clack, P.A.-C. and Coral Ceo, P.A.   ANESTHESIA:  General.   FINDINGS:  Saphenous vein satisfactory, left radial and left mammary  excellent, LAD diffusely diseased, but with good lumen, diagonal and  posterior descending fair quality targets, OM good-quality target, left  jugular hypertrophy, sternal osteoporosis, bullous emphysema.   CLINICAL NOTE:  Mr. Bozman is a 60 year old gentleman with no prior cardiac  history.  He presented with a 1-week history of chest pain.  He was admitted  with a presumptive diagnosis of pneumonia with his first set of cardiac  enzymes negative.  However, he subsequently ruled in for myocardial  infarction; and no definite infectious source could be found.  He underwent  cardiac catheterization.  He had been in acute renal failure on admission  with a creatinine of 2.6; however, his renal function rapidly improved.  At  cardiac catheterization he had critical  two-vessel disease.  He has a left  dominant circulation with a totally occluded LAD and first diagonal.  He  also had a 95% stenosis in a large obtuse marginal-1, that had a post  stenotic coronary aneurysm.  The patient was advised to undergo coronary  artery bypass grafting.  The indications, risks, benefits, and alternatives  were discussed in detail with the patient; and are outlined in the patient's  chart.  He understood and accepted the risks and agreed to proceed.   OPERATIVE NOTE:  Mr. Septer was brought to the preop holding area on April 23, 2006.  There, lines were placed by anesthesia to monitor arterial  central venous and pulmonary arterial pressure.  ECG leads were placed for  continuous telemetry.  The patient was taken to the operating room,  anesthetized, and intubated.  A Foley catheter was placed.  The chest,  abdomen, legs, and left arm were prepped and draped in usual fashion.  A  median sternotomy was performed; and the left internal mammary artery was  harvested using standard technique.  There was sternal osteoporosis.  The  left mammary was a good-quality vessel.  Of  note, there was significant  bullous emphysema easily visible within the operating aspects of the left  lung.  Simultaneously with this, an incision was made over the volar aspect  of the left wrist overlying the radial pulse.  A short segment of the radial  artery then was freed up and occluded proximally.  There was an excellent  pulse distally confirming the results of the preoperative Doppler; and  manual Allen's test.  The incision then was extended to just below the  antecubital fossa and the radial artery was harvested.  It was an excellent  quality vessel.   Also simultaneously an incision was made in the medial aspect of the left  leg at the level of the knee and the greater saphenous vein was harvested  endoscopically from the left thigh.  The vein was satisfactory quality.  Then 5000  units of heparin was administered during the vessel harvest.  The  remainder of the full heparin dose was administered prior to opening the  pericardium.  The pericardium was opened.  There was marked cardiomegaly.  The aorta was inspected; and there was no evidence of atherosclerotic change  at the site of cannulation.  The aorta was cannulated via concentric 2-0  Ethibond pledgeted pursestring sutures.  A dual-stage venous cannula was  placed via pursestring suture in the right atrial appendage.  Cardiopulmonary bypass was instituted after confirming adequate  anticoagulation with ACT measurement and the patient was cooled to 32  degrees Celsius.  The coronary arteries were inspected and anastomotic sites  were chosen.  The conduits were inspected and cut to length.  A foam pad was  placed in the pericardium to protect the __________  nerve.  A temperature  probe was placed in the myocardial septum; and a cardioplegic cannula was  placed in the ascending aorta.   The aorta was cross clamped, the left ventricle was emptied via the aortic  root vent.  Cardiac arrest then was achieved with a combination of cold  antegrade blood cardioplegia and topical iced saline.  Then 1 liter of  cardioplegia was administered.  The myocardial septal temperature was 12  degrees Celsius.  There was a good diastolic arrest.  The following distal  anastomoses were performed.   First a reversed saphenous vein graft was placed end-to-side to the  posterior descending branch of the left circumflex.  The left circumflex was  a dominant vessel.  The posterior descending branch was small and thin-  walled.  It had a 1.5 mm lumen when probed retrograde proximally.  Distally  only a 1-mm probe would pass.  The vein was anastomosed end-to-side with a  running 7-0 Prolene suture.  There was good flow through the vein.  This  anastomosis and all others were probed proximally and distally at their completion.  Each  graft was then flushed with heparinized saline to ensure  good flow; and then cardioplegia was administered.   Next, a reverse saphenous vein graft was placed end-to-side to the first  diagonal branch to the LAD.  This was a 1-mm diffusely diseased vessel,  again, thin-walled.  The anastomosis was performed end-to-side with a  running 7-0 Prolene suture.   Next, the distal end of the left radial artery was beveled and prepared for  grafting.  It was anastomosed end-to-side to obtuse marginal-1.  This was a  1.5-mm good-quality target.  The anastomosis was performed end-to-side with  a running 8-0 Prolene suture.  Again, there was good flow through  this  graft.   Next, the left internal mammary artery was brought through a window in the  pericardium.  The LAD was diffusely diseased; an arteriotomy was made, and a  1.5-mm probe passed proximally but only a 1-mm probe would pass distally;  and there appeared to be a shelf.  The arteriotomy was extended  approximately 1 cm distally; and there was in fact lateral plaquing at the  site of the septal perforating branch.  Beyond that a 1.5 mm probe passed  easily to the apex.  A relatively long left mammary-to-LAD anastomosis then  was performed with a running 8-0 Prolene suture.  At the completion of the  mammary-to-LAD anastomosis the bulldog clamp was briefly removed to inspect  for hemostasis.  Immediate and rapid septal rewarming was noted.  The  bulldog clamp was replaced and the mammary pedicle was tacked up to the  cardial surface of the heart with 6-0 Prolene sutures.   Additional cardioplegia was administered.  The cardioplegic cannula was  removed from the ascending aorta.  The proximal vein graft anastomosis was  performed to 4.5 mm punch aortotomies.  The aorta was relatively thick; and  although there was no discrete atherosclerotic plaque.  The aorta had a  firm, almost fibrous texture.  It was not felt to be appropriate to   anastomose the radial artery directly.  Therefore the vein grafts were  anastomosed directly to the aorta; two 4.5 mm punch aortotomies with running  6-0 Prolene sutures.  After completion of the final vein graft anastomosis,  lidocaine was administered; the bulldog clamps were removed from the left  mammary artery.  The aortic root was de-aired and the aortic crossclamp was  removed.  The total crossclamp time was 90 minutes.  The patient  spontaneously resumed sinus rhythm and did not require defibrillation.   Bulldog clamps then were placed proximally and distally on the proximal  portion of the vein graft to the diagonal, a venotomy was made.  The  proximal end of the radial graft was cut to length and beveled.  It was then  anastomosed end-to-side to the diagonal vein graft with a running 7-0  Prolene suture.  The anastomosis was de-aired; and the bulldog clamps were  removed from both vessels.   While the patient was being rewarmed all proximal and distal anastomoses were inspect for hemostasis.  Epicardial pacing wires were placed on the  right ventricle and right atrium.  When the patient had rewarmed to a core  temperature of 37 degrees Celsius, he was weaned from cardiopulmonary bypass  without difficulty.  The total bypass time was 145 minutes.  The patient  weaned from bypass in sinus rhythm; and with no inotropic support.  The  initial cardiac index was greater than 2 liters per minute per meter  squared.  The patient remained hemodynamically stable throughout the post  bypass period.   A test dose of protamine was administered and was well tolerated.  The  atrial and aortic cannulae were removed.  The remainder of the protamine was  administered without incident.  The chest was irrigated with 1 liter of warm  normal saline containing 1 gram of vancomycin.  Hemostasis was achieved.  The pericardium was not reapproximated.  The left pleural and two  mediastinal chest tubes  were placed through separate subcostal incisions.  The sternum was closed with interrupted heavy gauge double stainless steel  wires.  The pectoralis fascia and subcutaneous tissue and skin were  closed  in standard fashion.  All sponge, needle and sponge counts were correct at  the end of procedure.  The patient is taken from the operating room to the  surgical intensive care unit in critical but stable condition.           ______________________________  Salvatore Decent Dorris Fetch, M.D.     SCH/MEDQ  D:  04/23/2006  T:  04/24/2006  Job:  [id number]   cc:   Rollene Rotunda, MD, [facility]  Bevelyn Buckles. Bensimhon, MD  Oley Balm. Sung Amabile, MD

## 2011-01-11 NOTE — Discharge Summary (Signed)
NAME:  Joseph, [name]                         ACCOUNT NO.:  [id number]   MEDICAL RECORD NO.:  [id number]                   PATIENT TYPE:  INP   LOCATION:  3713                                 FACILITY:  [facility]   PHYSICIAN:  C. Ulyess Mort, M.D.             DATE OF BIRTH:  1950-11-12   DATE OF ADMISSION:  04/18/2004  DATE OF DISCHARGE:  04/25/2004                                 DISCHARGE SUMMARY   DISCHARGE DIAGNOSES:  1.  Rheumatoid arthritis.  2.  Psoriatic arthritis.  3.  Psoriasis.  4.  Gastritis.  5.  Polyneuropathy.  6.  Hypertension.   DISCHARGE MEDICATIONS:  1.  Neurontin 600 mg p.o. t.i.d.  2.  Elavil 25 mg p.o. nightly.  3.  Prednisone 10 mg daily.  4.  Methotrexate 15 mg weekly.  5.  Folic acid 1 mg daily.  6.  Protonix 40 mg daily.  7.  Percocet 5/325 mg 1 tab p.o. q.4 h. p.r.n. for pain.  8.  Hydrochlorothiazide 25 mg p.o. daily.   CONSULTATIONS:  1.  Consultation was sought with rheumatology with Dr. Areatha Keas, who felt      that the patient would benefit from methotrexate and some prednisone,      and had also stated that it would be a good idea for neurology to see      the patient for his neuropathy that he was undergoing, but also stated      that they would follow up with the patient as an outpatient on      discharge.  2.  Consultation was also sought with neurology per Dr. Janalyn Shy P. 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      They stated that they would follow the patient as an outpatient and      perform some EMG/nerve conduction studies to further characterize his      neuropathy and they had stated for the patient to be started on some      Neurontin 600 mg t.i.d. and also Elavil 25 mg nightly for his sleeping      and stated that if the Elavil did not  help, that those could be      increased to 50 mg at night.  3.  Consultation was also taken with podiatry by Eugenio Hoes. Petrinitz,      D.P.M. and he assessed the patient was having onychomycosis and      psoriasis, and during his hospital consultation, he performed a      debridement of the painful mycotic nails per the patient and encouraged      the patient to seek the care of a dermatologist regarding his  psoriatic      dermatitis.   FOLLOWUP:  1.  The patient is to follow up with Dr. Ramiro Harvest at the [facility] on May 07, 2004 at 1340 hours.  On followup with      Dr. Janee Morn at outpatient clinic, the patient is to get a STAT CBC and      a CMET, as he has recently been started on methotrexate.  Also, the      patient's sleep needs to be evaluated to see whether his Elavil needs to      be increased from 25 mg nightly to 50 mg nightly.  2.  The patient is to follow up with Dr. Pearlean Brownie in neurology as an outpatient      and Dr. Marlis Edelson office will call the patient with an appointment for his      followup.  3.  The patient is also to follow up with Dr. Phylliss Bob, rheumatology, and a      followup appointment should be scheduled for the patient.   PROCEDURES:  1.  A transthoracic echocardiogram was performed on April 19, 2004 which      showed the overall left ventricular systolic function was normal with      left ventricular ejection fraction in the range between 55% to 65%.      There was no left ventricular regional wall motion abnormalities.  2.  EKG performed on April 18, 2004 also showed a normal sinus rhythm with      a septal infarct of age undetermined.  3.  Plain films of the cervical spine with lateral flexion and extension      views were done on April 23, 2004 that showed some mild bilateral      carotid artery calcification, otherwise, a normal study.  There were no      fractures or subluxations and the interspaces were adequately  maintained      and the cranial vertebral junction appears intact.  4.  Plain films were done on the left foot and these showed no acute      skeletal abnormalities.  They showed some osteopenia and there were      moderate-to-severe degenerative changes involving the mid-foot, in      particular the cuboid-first metatarsal joint and also degenerative      changes in the interphalangeal joint of the great toe, and also showed a      very small plantar spur noted arising from the calcaneus.  5.  Plain films were also done of the left hand that showed chronic changes      of rheumatoid arthritis in the hand and the wrist with no acute skeletal      abnormalities.  6.  A chest x-ray was also performed on April 18, 2004 which showed      emphysema with some known active disease with that.   HISTORY OF PRESENT ILLNESS:  Joseph [name] is a 60 year old white male with  a past medical history of rheumatoid arthritis which was diagnosed 13 years  ago and was never on any DMARDs.  The patient was treated by his own primary  care physician with steroids and some NSAIDs in the '90s without any care  for his rheumatoid arthritis in the past 5 years.  The patient comes into  the emergency department today for worsening lower extremity pain and  numbness for the past 3-4 days.  He also stated that  he had some  intermittent chest pain that lasted approximately 2-3 minutes, unassociated  with effort of shortness of breath.  The patient denies any chronic chest  pain at the moment.   ALLERGIES:  No known drug allergies.   PAST MEDICAL HISTORY:  1.  Rheumatoid arthritis diagnosed 13 years ago with a rheumatoid factor of      1:640 in 1992.  2.  Coronary artery disease with an old MI per EKG.  3.  Psoriasis.  4.  Periodontitis.  5.  Gastritis secondary to use of NSAIDs.   ALLERGIES:  No known drug allergies.   MEDICATIONS:  1.  Ibuprofen 800 mg q.4 h. as needed. 2.  Tums p.r.n.   SUBSTANCE  HISTORY:  The patient is a current smoker, smokes 1 pack per day  for 36 years.  The patient usually drinks about 5-6 beers per day.  The  patient denies any alcohol or illicit IV drug use.   SOCIAL HISTORY:  The patient is single.  The patient has a 5-year college  degree and doing some fund-raising and p.r.n. consulting s well.  The  patient is self-pay in health care financing and prescription as he has lost  his insurance and he is in the process for applying for disability.  The  patient has not worked since July of 1997, lives in apartment in [place]  and supported by his parent.   PAST MEDICAL FAMILY HISTORY:  History of CHF which the patient's mother died  of at age 86.  Father is still living at age 85 with a history of coronary  artery disease, Alzheimer's and a CABG.  The patient has a sister with  hypertension and brother that committed suicide.   REVIEW OF SYSTEMS:  Review of systems is significant for fatigue, some chest  pain, some coughing, edema, a rash, joint pain, joint swelling and some  melena.   PHYSICAL EXAM:  VITAL SIGNS:  Temperature 98, pulse of 91, blood pressure of  172/106, O2 saturations of 98% on room air, respirations of 16.  GENERAL:  The patient is a pleasant man lying supine in bed in no apparent  distress.  HEENT:  Pupils equal, round and reactive to light.  Extraocular movements  are intact.  Scalp is thin; he has thin hair with excoriations.  Oropharynx  has some missing teeth, no ulcers, no lesions, no erythema.  The patient has  some seborrheic dermatosis on his face.  There is no JVD.  NECK:  Neck is supple.  PULMONARY:  Lungs are clear to auscultation bilaterally with no wheezing or  rhonchi.  CARDIOVASCULAR:  Heart is regular rate and rhythm with no murmurs, rubs or  gallops.  GI:  Abdomen is soft, nontender and nondistended with positive bowel sounds.  No hepatosplenomegaly.  EXTREMITIES:  No pitting edema.  He does have lower  extremity positive  rheumatoid arthritic nodules on the left shin and left calf, as well as  elbow nodules that are tender to palpation.  Nodules are red and hard to  touch.  The patient has some ulnar deviation in hands with bilateral  swelling and weakness at the wrists and fingers at this point, positive MCP  and PIP joints, and subcutaneous nodules bilaterally.  SKIN:  The patient has diffuse freckles, scattered raised scaly plaques with  heaped crusting with the appearance of stuck-on moles.  Nails:  The patient  has transverse depressions of positive onycholysis and positive  hyperkeratosis with no pitting.  LYMPH:  The patient has positive lymphadenopathy of the submandibular gland  which is nontender and non-erythematous.  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 NEUROLOGICAL:  The patient has decreased sensation of his lower extremities  in a glove-stocking distribution with some __________.  The patient also has  some decreased sensation in the tips of his fingers of his left hand.  PSYCHIATRIC:  The patient has appropriate affect, alert and oriented x4.   LABORATORIES AND X-RAY:  Admission labs:  Blood gas -- pH of 7.461, PCO2 of  35.2, bicarb of 25.1 with a base excess of 2.  CBC has a white count of 7.9,  RBC of 5.0, hemoglobin of 13.1, hematocrit of 39.2, MCV of 78.3, MCHC of  33.5, RDW of 16.2 and a platelet count of 333,000 with absolute neutrophils  of 7.5, absolute lymphocytes of 0.4, absolute monocytes of 0.0, absolute  eosinophils of 0.0, absolute basophils of 0.0 and ESR of 54.  A CMP showed a  sodium of 136, potassium 3.9, chloride of 106, bicarb of 25, BUN of 14,  creatinine of 0.7 and a glucose of 103, total calcium of 8.7, total protein  of 7.0, albumin of 3.2, AST of 20, ALT of 19, alkaline phosphatase of 78,  total bilirubin of 0.4.  Cardiac enzymes:  CK 167, CK-MB  18.4, troponin I  0.03.  Second set -- CK of 119, CK-MB of 12.7, troponin I of 0.05.  Third  set -- CK of 101, CK-MB of 9.9 and a troponin I of 0.01.   A chest x-ray showed some emphysema with no active disease.   EKG showed there was an old septal MI with Q waves.   HOSPITAL COURSE:  PROBLEM #1 - RHEUMATOID ARTHRITIS:  The patient, Mr.  Joseph [name], came in with an acute flare of his rheumatoid arthritis with  a rheumatoid factor as high as 2510 and an ESR of 54, which were both  increased and shown to be increased from rheumatoid arthritis.  The patient  was initially started on some dexamethasone 10 mg q.12 h. and then which  could have been subsequently increased to q.6 h.  The patient was also given  some morphine for his pain and some Neurontin for what was most likely to be  a polyneuropathy; Neurontin was started at 300 mg t.i.d. and the patient was  also started on a DMARD of methotrexate and folate for his rheumatoid  arthritis.  Rheumatology was consulted during his stay in the hospital and  they had agreed that methotrexate was a good idea to be started on the  patient, as he has had some extensive multiple chronic damage of his hands  and his feet from his longstanding history of rheumatoid arthritis.  They  had also felt that low-dose of prednisone, about 10 mg daily, would be  beneficial to this patient.  In terms of his neuropathic pain in his lower  extremities, rheumatology had recommended that we do some B12, HIV, serum  protein, immunofixation studies and also recommended that a neurology  consult be placed on the patient.  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 with paresthesias and  to continue his Elavil for his neuropathic pain, and also wanted the patient  to have an EMG and nerve conduction studies to characterize his neuropathy  as an outpatient.  During his hospital stay, the patient's pain seemed to  improve as his neuropathy seemed to improve as the Neurontin was increased  to 600 mg t.i.d. and his pain was managed with morphine, and then later on  switched to Percocet.  The patient stated that he had seen some improvement  during the hospitalization in terms of his rheumatoid arthritis and was also  given some aid from PT and OT to help with his movements and aid during this  hospitalization.  PT and OT were also consulted and they had recommended an  outpatient occupational therapy as well as provided the patient with  __________ for ADLS and education for joint pain protection as well as  providing the patient with some palm protectors to prevent skin breakdown  and further prevent further range of motion decrease in his fingers.  Physical therapy was also consulted during his stay and aided the patient as  well.   PROBLEM #2 - NEUROPATHY VERUS RAYNAUD'S PHENOMENA:  The patient was on  Neurontin, which was started at 300 mg and titrated up to 600 mg t.i.d. per  neurology's recommendation.  Neurology was consulted on his neuropathy and  several tests were done, as stated above.  It was felt that the patient  could be controlled on Neurontin and Elavil and Elavil be increased from 25  to 50 mg as needed for his sleep as an outpatient and stated that the  patient be followed then for EMG and nerve conduction studies to  characterize the patient's neuropathy as an outpatient on discharge.   PROBLEM #3 - HYPERTENSION:  The patient was treated for years of  hypertension.  The patient was started in the hospital on  metoprolol XL 50  mg, which was further decreased down to metoprolol XL 25 mg as his blood  pressure had dipped into the high 90s over 60s.  The patient's blood  pressure was controlled on metoprolol XL 25 mg during this hospitalization.  Due to the patient's lack of medical insurance, the patient was discharged  on hydrochlorothiazide 25 mg p.o. daily.  On followup, the patient's blood  pressure should be checked as well as blood pressure medication adjusted  accordingly.   PROBLEM #4 - PSORIASIS:  The patient's psoriasis was under control  throughout his hospitalization.  He was seen by podiatry that helped clip  his nails and consulted on him in terms of that and had requested the  patient to follow up with a dermatologist on discharge.   DISCHARGE LABORATORIES:  White count of 9.5, hemoglobin of 13.6, platelets  of 349,000.  CMP:  Sodium of 134, potassium of 4.2, chloride of 100, bicarb of 26, BUN of 19, creatinine of 0.7, glucose of 119.  Hemoglobin A1c of 5.9  with a TSH of 0.730.   DISPOSITION:  During his hospitalization, social work was consulted and  pharmacy was also consulted to aid the patient in possible home medication  needs.  During his hospitalization, social work had arranged for the patient  to get home health and transportation services at home and also had arranged  for him to get some assistance with his  medication.  On discharge, the  patient was afebrile with a temperature of 98.3, pulse of 57, blood pressure  of 118/83, respirations of 24, saturating 96% on room air.   The patient was discharged on his discharge medications as stated above,  with home health PT, OT and a nurses' aide per social work.  Prior to  discharge, the patient denied any emergent symptoms.  The patient is to  follow up with Dr. Janee Morn on about the 12th of September 2005 at 1340 at  the outpatient clinic at [facility], at which time a STAT CBC and a  STAT CMET should be  taken, as the patient is on methotrexate, to see whether  he is getting neutropenic, and also to schedule a followup appointment with  Dr. Phylliss Bob in rheumatology of follow up with the patient.  Also, there is the  question of [employer] prosthetics that the patient may need to help with his  feet.   Prior to discharge, the patient denied any emergent symptoms.  He will be  followed at [facility].  He will be also followed by Dr.  Pearlean Brownie, neurology  An appointment is to be made for the patient to be  followed by rheumatology.  The patient was also given some prednisone  samples as well as hydrochlorothiazide samples, as the patient is currently  applying for his disability.   It was a pleasant taking care of Joseph [name].      Ramiro Harvest, MD                       Gary Fleet, M.D.    DT/MEDQ  D:  05/06/2004  T:  05/07/2004  Job:  [id number]

## 2011-01-11 NOTE — Op Note (Signed)
NAME:  Joseph [name], Joseph [name]                         ACCOUNT NO.:  [id number]   MEDICAL RECORD NO.:  [id number]                   PATIENT TYPE:  INP   LOCATION:  3713                                 FACILITY:  [facility]   PHYSICIAN:  Eugenio Hoes. Petrinitz, D.P.M.        DATE OF BIRTH:  05/20/1951   DATE OF PROCEDURE:  04/20/2004  DATE OF DISCHARGE:                                 OPERATIVE REPORT   SUBJECTIVE:  Patient complained of long, painful toenails, thickened and  deformed.  Patient was referred for consultation courtesy of Dr. Niel Hummer.   OBJECTIVE:  Patient has toenails that are very elongated, thickened and  deformed.  They are severely incurvated and painful.  They are discolored  yellowish-brown, brittle and __________ friable, subungual debris.  There is  onycholysis and multiple signs of mycotic nail infection present.  Patient  is of intact peripheral vascular status.  The dorsalis and posterior tibial  pulses are palpable and very strong.  Cap refilling time is within normal  limits.  Patient has multiple papulosquamous skin lesions consistent with  psoriasis and had multiple questions regarding psoriasis.   ASSESSMENT:  1. Onychomycosis.  2. Psoriasis.   PLAN:  1. Hospital consultation.  Explain nature of fungus toenails, treatment     options and alternatives.  Encouraged patient to seek the care of     dermatologist regarding his psoriatic dermatitis.  2. Performed debridement of painful mycotic nails.                                               Jeffrey A. Petrinitz, D.P.M.    [id number]  D:  04/20/2004  T:  04/22/2004  Job:  [id number]

## 2011-01-11 NOTE — Consult Note (Signed)
NAME:  Joseph, [name]                         ACCOUNT NO.:  [id number]   MEDICAL RECORD NO.:  [id number]                   PATIENT TYPE:  INP   LOCATION:  3713                                 FACILITY:  [facility]   PHYSICIAN:  Pramod P. Pearlean Brownie, MD                 DATE OF BIRTH:  Oct 08, 1950   DATE OF CONSULTATION:  DATE OF DISCHARGE:                                   CONSULTATION   REFERRING PHYSICIAN:  Dr. Ardyth Gal   REASON FOR REFERRAL:  Neuropathy.   [name] is a 60 year old male who has been complaining of progressive  lower extremity paresthesia, pain, and difficulty walking for the last 10  months or so.  He states the paresthesia began symmetrically in the left  more than right lower extremity for 10 months.  He describes the pain  burning sensation which was occurring mainly at night but for the last 1-1/2  months it seemed to have progressed in severity and intensity.  He has also  noticed now numbness and pain involving his left hand as well.  He has also  noted that his walking is not right, he often trips and over steps his  balance easily.  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  He denied any peripheral neuropathy symptoms at that time.  He has no  known past neurological history.   PAST MEDICAL HISTORY:  Significant for longstanding rheumatoid arthritis  since 20 years.  The patient has not been on significant medical treatment  until recently.  He also has history of psoriasis.   MEDICATION ALLERGY:  None.   HOME MEDICATIONS:  Protonix, aspirin, Lovenox, albuterol, Decadron,  Duragesic patch, Toprol, methotrexate, folic acid, Elavil, nitroglycerin,  nicotine patch, calcium, Percocet.   SOCIAL HISTORY:  He is unemployed.  He used to drink, quit 4 years ago.  He  smokes.  He denies drug abuse.   REVIEW OF SYSTEMS:  Significant for some chest pain which was worked up and  was negative.  No shortness of breath, cough, diarrhea.   PHYSICAL EXAMINATION:  GENERAL:  A frail, middle-aged gentleman not in acute  distress.  VITAL SIGNS:  Afebrile with a pulse rate of 60 per minute regular, blood  pressure 144/92, O2 saturations are ____________ on room air.  Distal pulses  are well heard.  The patient has deformities in both hands from rheumatoid  arthritis.  NECK:  Supple without bruit.  ENT:  Unremarkable.  CARDIAC:  No murmur or gallop.  LUNGS:  Clear to auscultation.  NEUROLOGICAL:  The patient is awake, alert, oriented x3 with normal speech  and language function.  There is no aphasia, apraxia, or dysarthria.  Pupils  equal, reactive to light and accommodation.  Face is symmetric, palate  elevates normally.  Tongue is midline.  Motor system exam reveals symmetric  upper extremity strength.  There is some  weakness in both hands, mainly  secondary to deformities from his rheumatoid arthritis as well as pain.  There is no wasting of intrinsic muscles.  He can make good grip in both  hands.  Lower extremity exam reveals mild left foot drop.  There is weakness  of the ankle dorsiflexor from the left and bilaterally of the evertors  muscles.  Plantar flexors have good strength.  Deep tendon reflexes are 2+,  symmetric including both knee jerks, but both ankle jerks are absent.  Planters are downgoing.  He has subsequent decrease in plantar sensation of  both feet from the ankle down the left more than the right and in the left  hand involving the fingers only.  Position and vibration are preserved.  His  gait was not tested.   DATA REVIEWED:  ESR is elevated at 53.  Rheumatoid factor is strongly  positive at 1:2510.  The liver enzymes are normal.  Vitamin B12 is normal.   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  Other  possibilities would be less  likely.   PLAN:  I would check TSH, ANA panel, HIV, serum protein and urine  electrophoresis.  Increase in the Neurontin to 600 three times a day to help  with the paresthesias.  Continue Elavil.  If he is still unable to sleep  despite the present dose of Elavil, I would increase the dose to 50 mg at  night.  The patient has no health insurance and the use of costlier pain  medications would be not very productive in this case.  The patient needs  EMG nerve conduction studies to further characterize this neuropathy.  We  will see if it cannot be done as an outpatient.  Recommend outpatient follow  up in the future for the same.                                               Pramod P. Pearlean Brownie, MD    PPS/MEDQ  D:  04/23/2004  T:  04/23/2004  Job:  [id number]

## 2011-01-11 NOTE — Discharge Summary (Signed)
NAMEDARIK, MASSING               ACCOUNT NO.:  [id number]   MEDICAL RECORD NO.:  [id number]          PATIENT TYPE:  INP   LOCATION:  2018                         FACILITY:  [facility]   PHYSICIAN:  Salvatore Decent. Dorris Fetch, M.D.DATE OF BIRTH:  1951-08-11   DATE OF ADMISSION:  04/18/2006  DATE OF DISCHARGE:  05/12/2006                               DISCHARGE SUMMARY   HISTORY OF PRESENT ILLNESS:  The patient is a 60 year old male with a  past medical history significant for hypertension, tobacco abuse which  is ongoing, who was in his usual state of health until 4 days prior to  admission when he developed subacute onset of dizziness after blood  donation.  He had these symptoms intermittently for the next days and  additionally had some symptoms of weakness, shortness of breath with  exertion, and some left-sided chest pain.  He presented to the emergency  room for further evaluation.   PAST MEDICAL HISTORY:  1. Hypertension.  2. Tobacco abuse.  3. Gastroesophageal reflux/gastritis.  4. Peripheral neuropathy.  5. Psoriasis.  6. Rheumatoid arthritis.   HOME MEDICATIONS:  1. Avapro 150 mg daily.  2. Prednisone 10 mg daily.  3. Lyrica 150 mg t.i.d.  4. Elavil 150 mg b.i.d.  5. Methotrexate 2.5 mg, six tablets every week.  6. Hydrochlorothiazide 25 mg daily.  7. Prilosec 20 mg daily.  8. Folic acid 400 mg t.i.d.  9. Multivitamin one daily .  10.Vitamin C 100 mg daily.  11.Albuterol metered-dose inhaler p.r.n.   SOCIAL HISTORY:  Includes 30-40 years of one pack per day cigarettes.  Alcohol use, none.   FAMILY HISTORY:  Remarkable for cardiac disease.  His father had a  myocardial infarction at age 65 and mother had congestive heart failure.   PHYSICAL EXAMINATION:  Please see history and physical done on  admission.   HOSPITAL COURSE:  The patient was admitted with evidence of shock felt  to be most likely septic versus possible cardiogenic.  The patient was  admitted by the  pulmonary critical care medicine service.  The patient  was started on a sepsis protocol with intravenous fluids and dopamine,  additionally multiple cultures were obtained.  Also, the patient was to  be ruled out for pulmonary embolism.  In addition, the patient was noted  to have an acute renal insufficiency with a BUN of 32 and a creatinine  to 2.6.  Multiple tests were obtained for this as well including urine  sodium, urine creatinine, renal ultrasound, and the patient was treated  for his additional known diagnoses of rheumatoid arthritis and  gastroesophageal reflux.  The patient was followed closely and managed  aggressively.  A cardiology consultation was obtained as the patient did rule in for a  non-Q-wave myocardial infarction.  Medical management continued but  ultimately it was felt the patient should have a cardiac  catheterization.  The procedure was performed by Dr. Gala Romney on April 22, 2006.  Findings included severe two-vessel coronary artery disease,  and a left dominant system with total LAD and a large diagonal #1.  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  Elevated filling pressures with moderate pulmonary hypertension and  normal peripheral vascular resistance.  Due to these findings, CVTS  consultation was obtained for consideration of coronary artery bypass  grafting.  The patient was continued to be managed aggressively and was seen by  CVTS, Dr. Charlett Lango specifically.  The patient was recommended  to proceed with surgical revascularization.  The patient was known to  have multiple diagnoses related to the admission with also the  possibility of a pneumonia also being part of the issue, however, it was  felt the patient should best proceed with the surgery in a timely  fashion.   PROCEDURE:  On April 23, 2006, the patient was taken to the operating   room, at which time he underwent the following procedure:  Coronary  artery bypass grafting x4.   The following grafts were placed:  1. Left internal mammary artery to the LAD.  2. Saphenous vein graft to the diagonal III.  3. Left diagonal to the obtuse marginal.  4. Saphenous vein graft to posterior descending.   The patient tolerated the procedure well and was taken to the surgical  intensive care unit in stable condition.   POSTOPERATIVE HOSPITAL COURSE:  The patient's postoperative hospital  course was complicated by several different issues.  The patient did  have a postoperative acute delirium and required manipulation of  medications to include Haldol with discontinuation of narcotics.  Additional medications were added in the early period as the delirium  did persist.  He did eventually resolve his delirium over time, back to  his baseline.  Hemodynamically, the patient has remained stable.  He  initially did require some pressor support but these were weaned without  significant difficulties.  His pulmonary status was complicated by his  neuro issues in the early phases.  A pulmonary consultation was obtained  during the postoperative period for assistance with managing his  pulmonary issues and he did require BiPAP for part of the time.  Additionally, he was given intravenous steroids both stress dose as well  as for his pulmonary status.  The patient was noted postoperatively to  have a return to normal levels of his renal insufficiency.  His  creatinine and BUN are currently in the normal range.  The patient also  developed a postoperative rash that was felt to be related to  medications.  Multiple medications were discontinued and the rash  ultimately did improve.  Additionally, during the postoperative period  he did have atrial fibrillation but was converted to sinus rhythm with  medications.  He did not require heparin or Coumadin for this.  Over time, the patient  had shown a significant improvement.  He did develop  postoperative anemia requiring transfusion but this also stabilized  during the postoperative period.  The most recent laboratory values  prior to discharge dated May 10, 2006, revealed hemoglobin 8.7,  hematocrit 26.3, BUN 14, creatinine 0.7 with other electrolytes within  normal limits.  The patient responded well over time regarding cardiac  rehabilitation.  He required some nutritional assistance early in the  postoperative phase but gradually has returned to a normal diet and is  tolerating this without significant difficulties.  His oxygen was able  weaned over time.  He maintained good saturations on room air.  His  incisions did heal well without evidence of infection, although it was  noted that during the  postoperative period he developed click in the  sternum.  This was stable with time and did not show any other evidence  of infection.  The patient did not show any evidence of infection.  Ultimately, he was deemed to be acceptable for discharge on the date  May 12, 2006.   MEDICATIONS AT TIME OF DISCHARGE:  Include the following:  1. Eucerin cream to affected rash b.i.d.  2. Prednisone 10 mg daily.  3. Lopressor 25 mg b.i.d.  4. Lipitor 80 mg daily.  5. Elavil 75 mg daily.  6. Protonix 40 mg daily.  7. Niferex 150 mg b.i.d.  8. Thiamine 100 mg daily.  9. Folic acid 1 mg daily.  10.Lyrica 100 mg three times daily.  11.For pain, oxycodone 5 mg one or two every four to six hours as      needed.   INSTRUCTIONS:  The patient received written instructions regarding  medications, activity, diet, wound care, and followup.   Follow up with:  1. Dr. Antoine Poche on May 19, 2006.  2. Dr. Dorris Fetch on June 03, 2006.   FINAL DIAGNOSIS:  Cardiogenic shock due to non-ST-segment acute  myocardial infarction with also possible component of septic shock at  admission.   OTHER DIAGNOSES:  1. Acute renal  insufficiency, resolved.  2. Respiratory failure, resolved.  3. Possible pneumonia, resolved.  4. Chronic obstructive pulmonary disease.  5. Postoperative anemia.  6. Postoperative atrial fibrillation.  7. Severe multivessel coronary artery disease.  8. Gastroesophageal reflux.  9. History of tobacco abuse.  10.History of rheumatoid arthritis.      Rowe Clack, P.A.-C.    ______________________________  Salvatore Decent Dorris Fetch, M.D.    Sherryll Burger  D:  09/03/2006  T:  09/03/2006  Job:  [id number]   cc:   Salvatore Decent. Dorris Fetch, M.D.  [facility]  [facility]

## 2011-01-11 NOTE — Letter (Signed)
August 22, 2006     RE:  Joseph [name], Joseph [name]  MRN:  [id number]  /  DOB:  09-22-1950   To Whom It May Concern:   [name] is a regular patient of our clinic with multiple medical  problems that include the following:  1. Coronary artery disease - status post a heart attack on August 2007      with subsequent open heart surgery.  2. Rheumatoid arthritis diagnosed in the late 80s.  3. Peripheral neuropathy.  4. Emphysema.  5. Psoriasis.   If you have any question regards to this very nice gentleman, please do  not hesitate to call me.    Sincerely,      Willow Ora, MD  Electronically Signed    JP/MedQ  DD: 08/22/2006  DT: 08/22/2006  Job #: [id number]

## 2011-01-11 NOTE — Assessment & Plan Note (Signed)
[facility]                            CARDIOLOGY OFFICE NOTE   EARLIN, SWEEDEN                      MRN:          [id number]  DATE:09/23/2006                            DOB:          08/29/1950    PRIMARY CARE PHYSICIAN:  Dr. Drue Novel.   REASON FOR PRESENTATION:  A patient with coronary disease status post  CABG.   HISTORY OF PRESENT ILLNESS:  The patient is 60 years old. He has  coronary disease as described below. He has been doing well since I last  saw him. He has been participating in cardiac rehab at [facility]. He  has had a little lower extremity edema. He is limited in his activities  because of his arthritis. He uses lots of arm exercise machines. With  this he denies any chest discomfort, neck discomfort or arm discomfort.  He has had no palpitations or syncope. He has had no PND or orthopnea.  He will get dyspneic at home when trying to do some walking or  activities that involve muscle groups he does not typically use. He has  had some problems with non healing of his sternal wound, due to see his  rheumatologist to see if he can come off his steroids.   PAST MEDICAL HISTORY:  Coronary artery disease status post CABG (LIMA to  the LAD, left radial to the obtuse marginal, SVG to the first diagonal  and SVG to PDA. His ejection fraction is 50-55%), severe rheumatoid  arthritis, psoriasis, polyneuropathy, emphysema, previous tobacco abuse  (he has not smoked since his surgery), previous renal insufficiency.   ALLERGIES:  None.   MEDICATIONS:  Methadone 5 mg q.h.s., iron 65 mg daily, vitamin C,  furosemide 20 mg daily, Xanax, multivitamin, Lipitor 80 mg q.h.s.,  Amitriptyline, Metoprolol 25 mg b.i.d., Lyrica 100 mg t.i.d.,  omeprazole, prednisone 10 mg daily, aspirin, methotrexate, Klor-Con,  folic acid.   REVIEW OF SYSTEMS:  As stated in the HPI and otherwise negative for  other systems.   PHYSICAL EXAMINATION:  GENERAL: The  patient is in no distress.  VITAL SIGNS: Blood pressure 132/88, heart rate 87 and regular, weight  241 pounds, body mass index 30.  NECK: No jugular venous distension at 90 degrees, carotid upstroke brisk  and symmetric, no bruits, no thyromegaly.  LYMPHATICS: Negative.  LUNGS: Clear to auscultation bilaterally.  BACK: No costovertebral angle tenderness.  CHEST: Well-healed sternotomy scar, do not feel sternal mobility.  HEART: The PMI is not appreciated as patient is seated. S1 and S2 within  normal limits, no S3, no S4, no murmurs.  ABDOMEN: Obese, positive bowel sounds, normal in frequency and pitch, no  bruits, no rebound, no guarding. No midline pulsatile mass. No  organomegaly.  SKIN: No rashes, no nodules.  EXTREMITIES: Two+ pulses, trace bilateral ankle edema, severe arthritic  changes in hands predominantly.  NEURO: Grossly intact.   EKG: Sinus rhythm, rate 87, axis within normal limits, intervals within  normal limits, no acute ST, T wave changes.   ASSESSMENT AND PLAN:  1. Coronary disease. The patient is having no new symptoms. No  further      cardiovascular testing is suggested. He will continue with      secondary risk reduction.  2. Dyslipidemia. I looked in the computer to see if there had been a      recent lipid profile. None has been drawn. I will have him go back      to Dr. Leta Jungling office for a fasting lipid profile. I will be happy to      review this and make further suggestions.  3. Lower extremity edema. I think this is actually mild for his      problems with his inability to walk and his obesity. At this point      I would not recommend further management for this other than      keeping his feet elevated.   FOLLOW UP:  He would like to be seen again in 6 months and I will  accommodate this.     Rollene Rotunda, MD, [facility]  Electronically Signed    JH/MedQ  DD: 09/23/2006  DT: 09/23/2006  Job #: [id number]   cc:   Willow Ora, MD

## 2011-01-11 NOTE — Consult Note (Signed)
NAMECY, BRESEE NO.:  [id number]   MEDICAL RECORD NO.:  [id number]          PATIENT TYPE:  INP   LOCATION:  2111                         FACILITY:  [facility]   PHYSICIAN:  Salvatore Decent. Dorris Fetch, M.D.DATE OF BIRTH:  June 20, 1951   DATE OF CONSULTATION:  04/22/2006  DATE OF DISCHARGE:                                   CONSULTATION   REASON FOR CONSULTATION:  Severe 2-vessel disease, status post non-Q-wave  MI.   HISTORY OF PRESENT ILLNESS:  Mr. Giammarco is a 60 year old gentleman with a  past medical history significant for steroid dependent rheumatoid arthritis,  tobacco abuse, COPD and polyneuropathy.  He has been disabled due to his  rheumatoid arthritis and unable to work for many years.  He was in his usual  state of health until last Monday.  He had been on vacation the week before  and on Monday he donated a unit of blood.  After that he said he felt very  poorly.  He felt very weak and run down and began, over the next several  days, having intermittent chest pain.  He describes the chest pain as a  pressure in the anterior chest wall that does not radiate.  There was some  shortness of breath but no nausea, vomiting or diaphoresis on this occasion.  Interestingly he notes he had an episode with 5 hours of severe chest pain  and profuse diaphoresis back in 1998 but did not seek medical attention at  that time.  He came to the emergency room on April 18, 2006, with  progressive chest pain and malaise.  He was found in the emergency room to  have some nonspecific diffuse ST changes but his initial enzymes were  negative.  He had an elevated white blood cell count and an elevated serum  creatinine with acute renal failure with a creatinine of 2.6.  He was not  having any fevers or chills.  He did have a cough.  He was admitted with a  presumptive diagnosis given the white count, renal insufficiency and  hypotension, tachycardia of possible septic shock and a  chest x-ray showed a  questionable infiltrate in the right upper lobe.  That infiltrate  subsequently resolved.  He improved dramatically with rapid correction of  his renal failure but continued to have chest pain.  He did eventually rule  in for a myocardial infarction.  With a peak CK of 458, peak CK MB of 75,  peak troponin of 4.59, his relative index was 16.4.  He underwent cardiac  catheterization earlier today that revealed severe 2 vessel disease in the  left dominant circulation.  He had total occlusion of his LAD and a large  first diagonal at 95% stenosis and up to marginal 1 with aneurysm.  All of  these vessels filled via collaterals.  He also had some antegrade flow in  the OM.  He also had diffuse disease in his circumflex which gave rise to  multiple small posterior lateral and posterior descending branches.  Right  heart catheterization revealed a PA pressure of 45/27, and a  wedge of 26.  His cardiac index was 3.2.   Patient currently is pain free and denies any shortness of breath.   PAST MEDICAL HISTORY:  Is significant for rheumatoid arthritis, psoriasis,  polyneuropathy, COPD, tobacco use up until the time of admission, acute  renal failure this admission, shock this admission, possible pneumonia this  admission.   MEDICATIONS ON ADMISSION:  1. Avapro 150 mg daily.  2. Prednisone 10 mg daily.  3. Lyrica 150 mg t.i.d.  4. Haldol 150 mg b.i.d.  5. Methotrexate 15 mg q. week.  6. Hydrochlorothiazide 25 mg daily.  7. Prilosec 20 mg daily.  8. Folic acid 400 mg t.i.d.  9. Multi vitamin 1 daily.  10.Vitamin C 100 mg daily.  11.Albuterol MDI is p.r.n.   ALLERGIES:  No known drug allergies.   FAMILY HISTORY:  Father had a heart attack in his late 70s; he has had  bypass surgery twice by Dr. Andrey Campanile.  Mother had congestive heart failure.   SOCIAL HISTORY:  He is disabled due to rheumatoid arthritis.  He does not  drink alcohol.  He has got a 1-pack-per-day history  of smoking for 40 years.   PHYSICAL EXAMINATION:  GENERAL:  Mr. Landgrebe is a 60 year old white male in  good spirits and no acute distress, well developed and well nourished.  NEUROLOGICALLY:  Alert and oriented x3.  He is appropriate and grossly  intact motor function, although this is severely limited by his arthritis  and range of motion.  HEENT:  He is wearing glasses and has dentures.  NECK:  Supple without thyromegaly, adenopathy or bruits.  CARDIAC:  Regular rate and rhythm.  Normal S1, S2.  No rubs, murmurs or  gallops.  LUNGS:  Are clear with no wheezing.  ABDOMEN:  Soft, non tender.  EXTREMITIES:  Have rheumatoid arthritis changes in both hands.  He has 2+  radial pulses bilaterally with a normal Allis test on the left.  He has  palpable distal pulses, although they are more easily palpated on the left  than the right.  There is no peripheral edema.  SKIN:  Is warm and dry.   LABORATORY DATA:  White blood cell count on admission 19,000, now 10.8,  hemoglobin 10.7, platelets 219, creatinine on admission 2.6, now 0.8, BUN  went from 32 to 15, his sodium is 140, potassium 3.5, chloride 113, and CO2  of 23.  Cardiac enzymes as previously dictated.  His hemoglobin A1c was 6.1,  which is upper limit of normal.  __________ was negative.  PT was 13.1.  PTT  was 26 on admission.  Amylase was 207 on admission.  The remainder of his  laboratories and LFTs were normal.   IMPRESSION:  Mr. Mankins is a 59 year old gentleman who presented with an  unclear picture.  This was a combination of septic and cardiac mediated  shock.  It is unclear, even to this point.  He did have initially an  infiltrate or what appeared to be an infiltrate but that cleared rapidly,  much more rapidly than it would normally do so with antibiotics.  It may  have just been atelectasis.  He did have some pulmonary venous congestion. His overall picture would have been more consistent with dehydration given  his  elevated white count and creatinine.  He responded rapidly to  appropriate medical therapy and now has been found to have critical 2 vessel  disease with an ejection fraction of approximately 50% and needs coronary  artery  bypass grafting.   I had a long discussion with Mr. and Mrs. Almanza regarding the indications,  risks, benefits and alternatives with coronary artery bypass grafting.  Discussed a great deal with them the nature and extent of the operation  including the incisions to be used, the reasons for using a left radial  artery and the risks associated with a radial artery harvest specifically.  I also discussed the risks of surgery which include but not limited to  death, stroke, MI, DVT, PE, bleeding causing the need for transfusions,  infections as well as other organ system dysfunction, including respiratory,  renal or GI complications.  He is at increased risk for wound healing  complications and bleeding due to the Prednisone and increased risk for  pneumonia or other respiratory complications due to his possible pre  existing infection.   It would be nice from a surgical risk standpoint to delay surgery until  after a full course of IV antibiotics given his possible pneumonia; however,  I do not think we have that luxury given his ongoing albeit brief episodes  of post infarct chest pain.  After discussing this with the patient and his  wife, we have agreed to proceed with the surgery tomorrow morning.           ______________________________  Salvatore Decent. Dorris Fetch, M.D.     SCH/MEDQ  D:  04/22/2006  T:  04/22/2006  Job:  [id number]   cc:   Bevelyn Buckles. Bensimhon, MD  Oley Balm. Sung Amabile, MD

## 2011-01-18 ENCOUNTER — Other Ambulatory Visit: Payer: Self-pay | Admitting: Internal Medicine

## 2011-01-18 ENCOUNTER — Telehealth: Payer: Self-pay | Admitting: Internal Medicine

## 2011-01-18 MED ORDER — TIOTROPIUM BROMIDE MONOHYDRATE 18 MCG IN CAPS: 18.0000 ug | ORAL_CAPSULE | Freq: Every day | RESPIRATORY_TRACT | Status: DC

## 2011-01-18 NOTE — Telephone Encounter (Signed)
Spoke w/ pt and advised rx was sent to pharmacy. Nothing further was needed

## 2011-01-22 ENCOUNTER — Ambulatory Visit: Payer: BC Managed Care – PPO | Admitting: Cardiology

## 2011-02-21 ENCOUNTER — Other Ambulatory Visit: Payer: Self-pay | Admitting: Internal Medicine

## 2011-02-25 ENCOUNTER — Other Ambulatory Visit (INDEPENDENT_AMBULATORY_CARE_PROVIDER_SITE_OTHER): Payer: BC Managed Care – PPO

## 2011-02-25 DIAGNOSIS — M069 Rheumatoid arthritis, unspecified: Secondary | ICD-10-CM

## 2011-02-25 NOTE — Progress Notes (Signed)
Labs only

## 2011-02-26 LAB — CBC WITH DIFFERENTIAL/PLATELET
Basophils Relative: 0.3 % (ref 0.0–3.0)
Eosinophils Absolute: 0.2 10*3/uL (ref 0.0–0.7)
Hemoglobin: 14.6 g/dL (ref 13.0–17.0)
Lymphocytes Relative: 15 % (ref 12.0–46.0)
MCHC: 33.8 g/dL (ref 30.0–36.0)
Monocytes Relative: 4.7 % (ref 3.0–12.0)
Neutro Abs: 4.2 10*3/uL (ref 1.4–7.7)
Neutrophils Relative %: 77.1 % — ABNORMAL HIGH (ref 43.0–77.0)
RBC: 4.49 Mil/uL (ref 4.22–5.81)
WBC: 5.5 10*3/uL (ref 4.5–10.5)

## 2011-02-26 LAB — ALBUMIN: Albumin: 4.2 g/dL (ref 3.5–5.2)

## 2011-02-26 NOTE — Progress Notes (Signed)
Addended by: Legrand Como on: 02/26/2011 02:28 PM   Modules accepted: Orders

## 2011-02-28 ENCOUNTER — Telehealth: Payer: Self-pay | Admitting: *Deleted

## 2011-02-28 NOTE — Telephone Encounter (Signed)
Labs were sent to [facility] system.

## 2011-03-07 ENCOUNTER — Ambulatory Visit (INDEPENDENT_AMBULATORY_CARE_PROVIDER_SITE_OTHER): Payer: BC Managed Care – PPO | Admitting: Internal Medicine

## 2011-03-07 ENCOUNTER — Encounter: Payer: Self-pay | Admitting: Internal Medicine

## 2011-03-07 DIAGNOSIS — E119 Type 2 diabetes mellitus without complications: Secondary | ICD-10-CM

## 2011-03-07 DIAGNOSIS — I10 Essential (primary) hypertension: Secondary | ICD-10-CM

## 2011-03-07 DIAGNOSIS — M810 Age-related osteoporosis without current pathological fracture: Secondary | ICD-10-CM

## 2011-03-07 DIAGNOSIS — M359 Systemic involvement of connective tissue, unspecified: Secondary | ICD-10-CM

## 2011-03-07 DIAGNOSIS — I2510 Atherosclerotic heart disease of native coronary artery without angina pectoris: Secondary | ICD-10-CM | POA: Insufficient documentation

## 2011-03-07 DIAGNOSIS — M069 Rheumatoid arthritis, unspecified: Secondary | ICD-10-CM

## 2011-03-07 DIAGNOSIS — M79609 Pain in unspecified limb: Secondary | ICD-10-CM

## 2011-03-07 DIAGNOSIS — J449 Chronic obstructive pulmonary disease, unspecified: Secondary | ICD-10-CM

## 2011-03-07 LAB — TSH: TSH: 3.02 u[IU]/mL (ref 0.35–5.50)

## 2011-03-07 LAB — HEMOGLOBIN A1C: Hgb A1c MFr Bld: 6.6 % — ABNORMAL HIGH (ref 4.6–6.5)

## 2011-03-07 NOTE — Assessment & Plan Note (Signed)
At goal, labs  

## 2011-03-07 NOTE — Patient Instructions (Signed)
[facility]: Hulda Marin MD   [id number], [place], [place] [address] [phone]

## 2011-03-07 NOTE — Assessment & Plan Note (Signed)
Per Dr Rivadeneyra @ [place] 

## 2011-03-07 NOTE — Assessment & Plan Note (Signed)
Per Dr Sharren Bridge @ [address]

## 2011-03-07 NOTE — Assessment & Plan Note (Addendum)
Severe neuropathy , dx and f/u by Dr Pearlean Brownie. Pt will switch to a neurologist in [place] to see if "something can be done about it".

## 2011-03-07 NOTE — Assessment & Plan Note (Signed)
lookin for a new pain management MD in [place] (due to cost in [place]) Dr Vear Clock? See instructions

## 2011-03-07 NOTE — Assessment & Plan Note (Signed)
Well controlled 

## 2011-03-07 NOTE — Assessment & Plan Note (Addendum)
"  I never knew I have DM" Discussed w/ pt meaning of A1C Labs, consider meds if A1C close to 7

## 2011-03-07 NOTE — Assessment & Plan Note (Signed)
Abnormal pre-op stress test early this year, saw cards 11-2010: cleared for surgery Currently stable

## 2011-03-07 NOTE — Progress Notes (Signed)
  Subjective:    Patient ID: Joseph [name], male    DOB: [birth year], 60 y.o.   MRN: [id number]  HPI Routine office visit, here with his wife COPD-- good med compliance, routine use of meds preventing mucus accumulation DM--"I didn't know I have diabetes" Pain management-- currently at [facility] Dr Eduard Clos, considering moving to [place] CAD--visit with cardiology in April to review at HTN-- checks from time to time, good readings  Neuropathy-- used to see Dr Pearlean Brownie, to be referred to a new neurologist at [facility]    Past Medical History  Diagnosis Date  . Rheumatoid arthritis   . Psoriasis   . Hypertension   . Gastroesophageal reflux disease   . COPD (chronic obstructive pulmonary disease)   . CAD (coronary artery disease)     MI 2007, CABG  . Osteoporosis     per DEXA 05-2008  . Peripheral polyneuropathy   . Diabetes mellitus     Dx 07-2008 A1C 6.2  . Gastritis    Past Surgical History  Procedure Date  . Foot surgery     to tendon release and repair of osteomyelitis   . Cholecystectomy   . Coronary artery bypass graft 2007    (LIMA to the LAD, left radial to obtuse marginal, SVG to first diagonal, SVG to PDA. His ejection fraction to 50-55%  . Achiles tendon surgery 8/09  . Toe amputation 4-12    d/t a deformity, found to have osteomyelitis, complicated by post-op foot strtess FX       Review of Systems No chest pain or shortness of breath No nausea, vomiting, and diarrhea His main concern today is the fact that he had the second right toe amputated in April, they found osteomyelitis, surgery was complicated by a stress fracture in the right foot few days later. Wearing a cast. His orthopedic doctor thinks that the complications from his foot are d/t neuropathy.    Objective:   Physical Exam  Constitutional: He appears well-developed and well-nourished.       No distress, on  portable oxygen  Cardiovascular: Regular rhythm.   No murmur heard. Pulmonary/Chest: Effort  normal and breath sounds normal. No respiratory distress. He has no wheezes. He has no rales.          Assessment & Plan:  Today , I spent more than 30  min with the patient, >50% of the time counseling, discussed his post-op stress fracture

## 2011-03-12 ENCOUNTER — Other Ambulatory Visit: Payer: Self-pay | Admitting: *Deleted

## 2011-03-12 MED ORDER — ALENDRONATE SODIUM 70 MG PO TABS: 70.0000 mg | ORAL_TABLET | ORAL | Status: DC

## 2011-03-12 NOTE — Telephone Encounter (Signed)
Refill sent. Left message notifying pt.

## 2011-03-18 ENCOUNTER — Telehealth: Payer: Self-pay | Admitting: Cardiology

## 2011-03-18 NOTE — Telephone Encounter (Signed)
Pt has been given alendronate for a new prob lem he is having and he has questions about it to see if it will interfere with heart meds

## 2011-03-18 NOTE — Telephone Encounter (Signed)
Left message to return the call at his convenients.  Fosamax may reduce the absorption of Calcium and/or antiacids - several doses by 30 mins.  May cause increase GI upset with ASA and NSAIDS.  He will need to take the medication in the AM with plain water atleast 30 mins prior to any other food or drink.  Swallow whole.  dont' lie down for at least 30 mins after taking.

## 2011-03-22 ENCOUNTER — Other Ambulatory Visit: Payer: Self-pay | Admitting: Internal Medicine

## 2011-03-26 ENCOUNTER — Other Ambulatory Visit: Payer: Self-pay | Admitting: Internal Medicine

## 2011-03-27 NOTE — Telephone Encounter (Signed)
Pt never called back.

## 2011-04-09 ENCOUNTER — Ambulatory Visit (INDEPENDENT_AMBULATORY_CARE_PROVIDER_SITE_OTHER): Payer: BC Managed Care – PPO | Admitting: Internal Medicine

## 2011-04-09 ENCOUNTER — Encounter: Payer: Self-pay | Admitting: Internal Medicine

## 2011-04-09 VITALS — BP 110/64 | HR 69 | Ht 74.0 in | Wt 233.0 lb

## 2011-04-09 DIAGNOSIS — J449 Chronic obstructive pulmonary disease, unspecified: Secondary | ICD-10-CM

## 2011-04-09 DIAGNOSIS — Z23 Encounter for immunization: Secondary | ICD-10-CM | POA: Insufficient documentation

## 2011-04-09 MED ORDER — ALBUTEROL 90 MCG/ACT IN AERS: 2.0000 | INHALATION_SPRAY | RESPIRATORY_TRACT | Status: DC | PRN

## 2011-04-09 MED ORDER — TIOTROPIUM BROMIDE MONOHYDRATE 18 MCG IN CAPS: 18.0000 ug | ORAL_CAPSULE | Freq: Every day | RESPIRATORY_TRACT | Status: DC

## 2011-04-09 MED ORDER — PNEUMOCOCCAL VAC POLYVALENT 25 MCG/0.5ML IJ INJ
0.5000 mL | INJECTION | Freq: Once | INTRAMUSCULAR | Status: AC
  Administered 2011-04-09: 0.5 mL via INTRAMUSCULAR

## 2011-04-09 MED ORDER — FLUTICASONE-SALMETEROL 100-50 MCG/DOSE IN AEPB: 1.0000 | INHALATION_SPRAY | Freq: Two times a day (BID) | RESPIRATORY_TRACT | Status: DC

## 2011-04-09 NOTE — Progress Notes (Signed)
Subjective:    Patient ID: Joseph [name], male    DOB: Sep 15, 1950, 60 y.o.   MRN: [id number]  HPI 04/09/11- 26 yoM former smoker, followed for COPD, complicated by RA, CAD/ ASVD. Psoriasis, DM, peripheral neuropathy    Wife here Last here October 22, 2010  Oxygen 2 L continuous. He says as long as he uses the Flutter and Incentive spirometer twice daily he can keep his chest clear. He says with these he couldn't be happier and the meds work great. Sputum tends to be very thick. No panic dyspnea episodes.  We discussed vaccinations. He gets flu vax and had one pneumovax in 2007. He is interested in getting a booster- discussed.  On prednisone 5 alternating with 6 mg daily. Had chemical stress test ok for foot surgery.   Review of Systems Constitutional:   No-   weight loss, night sweats, fevers, chills, fatigue, lassitude. HEENT:   No-  headaches, difficulty swallowing, tooth/dental problems, sore throat,       No-  sneezing, itching, ear ache, nasal congestion, post nasal drip,  CV:  No-   chest pain, orthopnea, PND, swelling in lower extremities, anasarca, dizziness, palpitations Resp: No-   shortness of breath with exertion or at rest.              +  productive cough,  No non-productive cough,  No-  coughing up of blood.              No-   change in color of mucus.  No- wheezing.   Skin: No-   rash or lesions. GI:  No-   heartburn, indigestion, abdominal pain, nausea, vomiting, diarrhea,                 change in bowel habits, loss of appetite GU: No-   dysuria, change in color of urine, no urgency or frequency.  No- flank pain. MS: + diffuse arthritic pain. Neuro- grossly normal to observation, Or:  Psych:  No- change in mood or affect. No depression or anxiety.  No memory loss.      Objective:   Physical Exam General- Alert, Oriented, Affect-appropriate, Distress- none acute          O2 portable 2 L/M Skin- rash-none, lesions- none, excoriation- none Lymphadenopathy-  none Head- atraumatic            Eyes- Gross vision intact, PERRLA, conjunctivae clear secretions            Ears- Hearing, canals normal     Nose- Clear, No- Septal dev, mucus, polyps, erosion, perforation             Throat- Mallampati III , mucosa clear , drainage- none, tonsils- atrophic Neck- flexible , trachea midline, no stridor , thyroid nl, carotid no bruit Chest - symmetrical excursion , unlabored           Heart/CV- RRR , no murmur , no gallop  , no rub, nl s1 s2                           - JVD- none , edema- none, stasis changes- none, varices- none           Lung- very clear to P&A, wheeze- none, cough- none , dullness-none, rub- none           Chest wall-  Abd- tender-no, distended-no, bowel sounds-present, HSM- no Br/ Gen/ Rectal- Not done, not indicated Extrem- cyanosis-  none, clubbing, none, atrophy- none,      +contracted fingers  Right foot in walking boot, left in soft shoe Neuro- grossly intact to observation         Assessment & Plan:

## 2011-04-09 NOTE — Patient Instructions (Addendum)
Med refills sent  Continue oxygen   Pneumovax

## 2011-04-10 ENCOUNTER — Encounter: Payer: Self-pay | Admitting: Internal Medicine

## 2011-04-10 NOTE — Assessment & Plan Note (Signed)
He is managing secretions with regular use of incentive spirometer and flutter. His ability to exercise for stamina is quite limited. Recognize potential for ASCVD symptoms as well.

## 2011-04-22 ENCOUNTER — Other Ambulatory Visit: Payer: Self-pay | Admitting: Internal Medicine

## 2011-04-23 NOTE — Telephone Encounter (Signed)
Rx Done . 

## 2011-05-09 ENCOUNTER — Other Ambulatory Visit: Payer: Self-pay | Admitting: Cardiology

## 2011-05-15 LAB — BASIC METABOLIC PANEL
Chloride: 101
Creatinine, Ser: 0.86
GFR calc Af Amer: >60
Potassium: 3.7

## 2011-05-24 LAB — URINE CULTURE
Colony Count: 35000
Colony Count: NO GROWTH
Culture: NO GROWTH

## 2011-05-24 LAB — CBC
Hemoglobin: 12.5 — ABNORMAL LOW
MCHC: 33.1
Platelets: 181
RBC: 4.05 — ABNORMAL LOW
RBC: 4.19 — ABNORMAL LOW
WBC: 13 — ABNORMAL HIGH
WBC: 21.4 — ABNORMAL HIGH

## 2011-05-24 LAB — URINALYSIS, ROUTINE W REFLEX MICROSCOPIC
Bilirubin Urine: NEGATIVE
Glucose, UA: NEGATIVE
Hgb urine dipstick: NEGATIVE
Nitrite: NEGATIVE
Nitrite: NEGATIVE
Protein, ur: NEGATIVE
Protein, ur: NEGATIVE
Urobilinogen, UA: 4 — ABNORMAL HIGH

## 2011-05-24 LAB — CULTURE, BLOOD (ROUTINE X 2)
Culture: NO GROWTH
Culture: NO GROWTH

## 2011-05-24 LAB — CARDIAC PANEL(CRET KIN+CKTOT+MB+TROPI)
CK, MB: 2.6
Relative Index: INVALID
Total CK: 50
Total CK: 53
Troponin I: 0.01

## 2011-05-24 LAB — BASIC METABOLIC PANEL
BUN: 13
CO2: 23
Calcium: 8.2 — ABNORMAL LOW
Chloride: 106
Chloride: 110
Creatinine, Ser: 0.94
GFR calc Af Amer: >60
GFR calc Af Amer: >60
GFR calc non Af Amer: >60
Glucose, Bld: 169 — ABNORMAL HIGH
Potassium: 3.6
Sodium: 140

## 2011-05-24 LAB — URINE MICROSCOPIC-ADD ON

## 2011-05-24 LAB — LEGIONELLA ANTIGEN, URINE: Legionella Antigen, Urine: NEGATIVE

## 2011-05-24 LAB — BLOOD GAS, ARTERIAL
Bicarbonate: 20.7
O2 Saturation: 95.5
Patient temperature: 98.6
TCO2: 21.9
pH, Arterial: 7.362

## 2011-05-24 LAB — PROTIME-INR: Prothrombin Time: 15.3 — ABNORMAL HIGH

## 2011-05-24 LAB — CK TOTAL AND CKMB (NOT AT ARMC)
CK, MB: 2.1
Relative Index: INVALID
Total CK: 62

## 2011-05-24 LAB — HEPATIC FUNCTION PANEL
Bilirubin, Direct: 0.4 — ABNORMAL HIGH
Indirect Bilirubin: 0.8
Total Protein: 5.6 — ABNORMAL LOW

## 2011-05-24 LAB — TROPONIN I: Troponin I: <0.01

## 2011-05-24 LAB — MAGNESIUM: Magnesium: 2

## 2011-05-24 LAB — D-DIMER, QUANTITATIVE: D-Dimer, Quant: 8.43 — ABNORMAL HIGH

## 2011-05-24 LAB — PHOSPHORUS: Phosphorus: 2.4

## 2011-05-24 LAB — APTT: aPTT: 26

## 2011-05-27 LAB — BASIC METABOLIC PANEL
CO2: 25
Calcium: 8.2 — ABNORMAL LOW
Creatinine, Ser: 0.62
GFR calc Af Amer: >60
GFR calc non Af Amer: >60
Glucose, Bld: 216 — ABNORMAL HIGH

## 2011-05-27 LAB — URINALYSIS, ROUTINE W REFLEX MICROSCOPIC
Glucose, UA: NEGATIVE
Hgb urine dipstick: NEGATIVE
Specific Gravity, Urine: 1.012
Urobilinogen, UA: 1

## 2011-05-27 LAB — CARDIAC PANEL(CRET KIN+CKTOT+MB+TROPI)
Relative Index: INVALID
Relative Index: INVALID
Relative Index: INVALID
Total CK: 25
Total CK: 42
Troponin I: 0.01
Troponin I: 0.01

## 2011-05-27 LAB — CBC
HCT: 38 — ABNORMAL LOW
Hemoglobin: 11.9 — ABNORMAL LOW
Hemoglobin: 12.9 — ABNORMAL LOW
MCHC: 33.9
MCV: 90.8
RBC: 4.19 — ABNORMAL LOW
RDW: 16.2 — ABNORMAL HIGH

## 2011-05-27 LAB — DIFFERENTIAL
Basophils Absolute: 0.2 — ABNORMAL HIGH
Eosinophils Relative: 1
Lymphocytes Relative: 4 — ABNORMAL LOW
Monocytes Relative: 2 — ABNORMAL LOW
Neutrophils Relative %: 92 — ABNORMAL HIGH

## 2011-05-27 LAB — COMPREHENSIVE METABOLIC PANEL
BUN: 14
CO2: 27
Calcium: 9
Creatinine, Ser: 0.7
GFR calc Af Amer: >60
GFR calc non Af Amer: >60
Glucose, Bld: 105 — ABNORMAL HIGH

## 2011-05-27 LAB — URINE CULTURE

## 2011-05-27 LAB — CULTURE, BLOOD (ROUTINE X 2)
Culture: NO GROWTH
Culture: NO GROWTH

## 2011-05-27 LAB — POCT CARDIAC MARKERS: Troponin i, poc: <0.05

## 2011-05-28 LAB — COMPREHENSIVE METABOLIC PANEL
ALT: 24 U/L (ref 0–53)
AST: 20 U/L (ref 0–37)
AST: 29 U/L (ref 0–37)
Albumin: 2.7 g/dL — ABNORMAL LOW (ref 3.5–5.2)
Albumin: 3.9 g/dL (ref 3.5–5.2)
BUN: 12 mg/dL (ref 6–23)
CO2: 28 mEq/L (ref 19–32)
CO2: 27 mEq/L (ref 19–32)
Calcium: 8.6 mg/dL (ref 8.4–10.5)
Creatinine, Ser: 0.61 mg/dL (ref 0.4–1.5)
GFR calc Af Amer: >60 mL/min (ref >60)
GFR calc non Af Amer: >60 mL/min (ref >60)
Glucose, Bld: 138 mg/dL — ABNORMAL HIGH (ref 70–99)
Sodium: 144 mEq/L (ref 135–145)
Sodium: 139 mEq/L (ref 135–145)
Total Bilirubin: 0.7 mg/dL (ref 0.3–1.2)
Total Protein: 5.6 g/dL — ABNORMAL LOW (ref 6.0–8.3)
Total Protein: 7.3 g/dL (ref 6.0–8.3)

## 2011-05-28 LAB — DIFFERENTIAL
Basophils Absolute: 0.3 10*3/uL — ABNORMAL HIGH (ref 0.0–0.1)
Eosinophils Relative: 2 % (ref 0–5)
Lymphocytes Relative: 5 % — ABNORMAL LOW (ref 12–46)
Lymphs Abs: 0.6 10*3/uL — ABNORMAL LOW (ref 0.7–4.0)
Monocytes Absolute: 0.4 10*3/uL (ref 0.1–1.0)
Monocytes Relative: 3 % (ref 3–12)

## 2011-05-28 LAB — CULTURE, BLOOD (ROUTINE X 2)
Culture: NO GROWTH
Culture: NO GROWTH

## 2011-05-28 LAB — CBC
HCT: 38.7 % — ABNORMAL LOW (ref 39.0–52.0)
Hemoglobin: 12.9 g/dL — ABNORMAL LOW (ref 13.0–17.0)
MCHC: 32.9 g/dL (ref 30.0–36.0)
MCHC: 33.5 g/dL (ref 30.0–36.0)
MCV: 92 fL (ref 78.0–100.0)
Platelets: 198 10*3/uL (ref 150–400)
RBC: 3.87 MIL/uL — ABNORMAL LOW (ref 4.22–5.81)
RBC: 4.27 MIL/uL (ref 4.22–5.81)
RDW: 15.9 % — ABNORMAL HIGH (ref 11.5–15.5)
RDW: 15.1 % (ref 11.5–15.5)

## 2011-05-28 LAB — HEMOGLOBIN A1C
Hgb A1c MFr Bld: 6.2 % — ABNORMAL HIGH (ref 4.6–6.1)
Mean Plasma Glucose: 131 mg/dL

## 2011-05-28 LAB — B-NATRIURETIC PEPTIDE (CONVERTED LAB): Pro B Natriuretic peptide (BNP): 132 pg/mL — ABNORMAL HIGH (ref 0.0–100.0)

## 2011-05-28 LAB — WOUND CULTURE: Culture: NO GROWTH

## 2011-05-31 LAB — POCT I-STAT 3, ART BLOOD GAS (G3+)
Bicarbonate: 26.5 — ABNORMAL HIGH
O2 Saturation: 92
Patient temperature: 98.7
TCO2: 28
pCO2 arterial: 37

## 2011-05-31 LAB — CBC
HCT: 37.8 % — ABNORMAL LOW (ref 39.0–52.0)
Hemoglobin: 12.6 g/dL — ABNORMAL LOW (ref 13.0–17.0)
MCHC: 33.2 g/dL (ref 30.0–36.0)
MCHC: 33.7
MCV: 91.8 fL (ref 78.0–100.0)
Platelets: 282
RDW: 15.8 % — ABNORMAL HIGH (ref 11.5–15.5)
RDW: 14

## 2011-05-31 LAB — COMPREHENSIVE METABOLIC PANEL
ALT: 37
AST: 26
Albumin: 3.2 — ABNORMAL LOW
Alkaline Phosphatase: 79
BUN: 9
Chloride: 103
Potassium: 3.4 — ABNORMAL LOW
Sodium: 138
Total Bilirubin: 0.5

## 2011-05-31 LAB — CARDIAC PANEL(CRET KIN+CKTOT+MB+TROPI)
CK, MB: 2.9
CK, MB: 3.4
Relative Index: INVALID
Total CK: 62
Troponin I: 0.01

## 2011-05-31 LAB — ALBUMIN: Albumin: 3.9

## 2011-05-31 LAB — DIFFERENTIAL
Basophils Absolute: 0
Basophils Relative: 0
Lymphocytes Relative: 8 — ABNORMAL LOW
Neutro Abs: 7.2

## 2011-05-31 LAB — BASIC METABOLIC PANEL
BUN: 7
CO2: 24
Calcium: 8.7
Creatinine, Ser: 0.67
GFR calc non Af Amer: >60
Glucose, Bld: 149 — ABNORMAL HIGH

## 2011-05-31 LAB — CK TOTAL AND CKMB (NOT AT ARMC)
Relative Index: INVALID
Total CK: 90

## 2011-05-31 LAB — TSH: TSH: 0.536

## 2011-05-31 LAB — POCT CARDIAC MARKERS
CKMB, poc: 4.1
Myoglobin, poc: 77.9

## 2011-05-31 LAB — LIPID PANEL: VLDL: 12

## 2011-05-31 LAB — B-NATRIURETIC PEPTIDE (CONVERTED LAB): Pro B Natriuretic peptide (BNP): 100

## 2011-06-02 ENCOUNTER — Other Ambulatory Visit: Payer: Self-pay | Admitting: Internal Medicine

## 2011-06-03 NOTE — Telephone Encounter (Signed)
Done

## 2011-06-26 ENCOUNTER — Other Ambulatory Visit (INDEPENDENT_AMBULATORY_CARE_PROVIDER_SITE_OTHER): Payer: BC Managed Care – PPO

## 2011-06-26 DIAGNOSIS — M069 Rheumatoid arthritis, unspecified: Secondary | ICD-10-CM

## 2011-06-26 DIAGNOSIS — Z23 Encounter for immunization: Secondary | ICD-10-CM

## 2011-06-27 LAB — CBC WITH DIFFERENTIAL/PLATELET
Basophils Relative: 0.2 % (ref 0.0–3.0)
Eosinophils Absolute: 0.2 10*3/uL (ref 0.0–0.7)
Eosinophils Relative: 2.1 % (ref 0.0–5.0)
Hemoglobin: 13.8 g/dL (ref 13.0–17.0)
MCHC: 33.8 g/dL (ref 30.0–36.0)
MCV: 96.3 fl (ref 78.0–100.0)
Monocytes Absolute: 0.6 10*3/uL (ref 0.1–1.0)
Neutro Abs: 6.7 10*3/uL (ref 1.4–7.7)
RBC: 4.23 Mil/uL (ref 4.22–5.81)
WBC: 8.7 10*3/uL (ref 4.5–10.5)

## 2011-07-02 ENCOUNTER — Telehealth: Payer: Self-pay

## 2011-07-02 NOTE — Telephone Encounter (Signed)
Patient made aware of lab results and a copy of labs results faxed to Dr. Roanna Banning at [phone]

## 2011-07-02 NOTE — Telephone Encounter (Signed)
Message copied by Francisco Capuchin on Tue Jul 02, 2011  8:51 AM ------      Message from: Willow Ora E      Created: Sun Jun 30, 2011 10:06 AM       Advise patient:      Diabetes well controlled      I believe the results need to be faxed to his rheumatologist, please ask pt and fax results

## 2011-07-29 ENCOUNTER — Other Ambulatory Visit: Payer: Self-pay | Admitting: Internal Medicine

## 2011-07-29 NOTE — Telephone Encounter (Signed)
Last OV 03/07/11. Last filled 06/02/11

## 2011-07-31 ENCOUNTER — Telehealth: Payer: Self-pay | Admitting: Internal Medicine

## 2011-07-31 MED ORDER — ACAPELLA MISC: Status: AC

## 2011-07-31 NOTE — Telephone Encounter (Signed)
Pt says his flutter device is no longer working properly and is over 59 years old. I have sent a prescription for a new flutter device to the pt's pharmacy. The pt is also requesting a new incentive spirometer and this will need to be ordered through [facility] or pt given an rx to take this to [facility]. Pls advise if okay to send order for incentive spirometer.

## 2011-08-01 MED ORDER — UNABLE TO FIND: Status: DC

## 2011-08-01 NOTE — Telephone Encounter (Signed)
Spoke with pt. He is requesting that we write out rx and mail to him for incentive spirometer. I verified the address and placed on CDY's cart to be signed.

## 2011-08-01 NOTE — Telephone Encounter (Signed)
Ok to order incentive spirometer

## 2011-09-16 ENCOUNTER — Ambulatory Visit (INDEPENDENT_AMBULATORY_CARE_PROVIDER_SITE_OTHER): Payer: BC Managed Care – PPO | Admitting: Internal Medicine

## 2011-09-16 ENCOUNTER — Encounter: Payer: Self-pay | Admitting: Internal Medicine

## 2011-09-16 VITALS — BP 112/66 | HR 80 | Ht 74.0 in | Wt 242.4 lb

## 2011-09-16 DIAGNOSIS — J449 Chronic obstructive pulmonary disease, unspecified: Secondary | ICD-10-CM

## 2011-09-16 MED ORDER — FLUTTER DEVI: Status: DC

## 2011-09-16 NOTE — Patient Instructions (Addendum)
Order/ script- Flutter device for pulmonary airway clearance    Blow through several times, 2 or three sets per day as needed  Continue the Incentive Spirometer   See [facility] to discuss available support for Advair and Spiriva

## 2011-09-16 NOTE — Progress Notes (Signed)
Subjective:    Patient ID: Joseph [name], male    DOB: 1951/07/26, 61 y.o.   MRN: [id number]  HPI 04/09/11- 89 yoM former smoker, followed for COPD, complicated by RA, CAD/ ASVD. Psoriasis, DM, peripheral neuropathy    Wife here Last here October 22, 2010  Oxygen 2 L continuous. He says as long as he uses the Flutter and Incentive spirometer twice daily he can keep his chest clear. He says with these he couldn't be happier and the meds work great. Sputum tends to be very thick. No panic dyspnea episodes.  We discussed vaccinations. He gets flu vax and had one pneumovax in 2007. He is interested in getting a booster- discussed.  On prednisone 5 alternating with 6 mg daily. Had chemical stress test ok for foot surgery.   09/16/11- 52 yoM former smoker, followed for COPD, complicated by RA, CAD/ ASVD. Psoriasis, DM, peripheral neuropathy    Wife here He asks for replacement flutter device which has worked well. He has avoided catching the flu. Still fighting wounds on his feet from his rheumatoid arthritis with peripheral neuropathy. He can feel mucus rattling in his chest but if he uses both incentive spirometer and his flutter device every day he can control it.   Review of Systems Constitutional:   No-   weight loss, night sweats, fevers, chills, fatigue, lassitude. HEENT:   No-  headaches, difficulty swallowing, tooth/dental problems, sore throat,       No-  sneezing, itching, ear ache, nasal congestion, post nasal drip,  CV:  No-   chest pain, orthopnea, PND, swelling in lower extremities, anasarca, dizziness, palpitations Resp: No-   shortness of breath with exertion or at rest.              +  productive cough,  No non-productive cough,  No-  coughing up of blood.              No-   change in color of mucus.  No- wheezing.   Skin: No-   rash or lesions. GI:                 GU:  MS: + diffuse arthritic pain. Neuro- grossly normal to observation, Or:  Psych:  No- change in mood or  affect. No depression or anxiety.  No memory loss.      Objective:   Physical Exam General- Alert, Oriented, Affect-appropriate, Distress- none acute          O2 portable 2 L/M  94% Skin- rash-none, lesions- none, excoriation- none Lymphadenopathy- none Head- atraumatic            Eyes- Gross vision intact, PERRLA, conjunctivae clear secretions            Ears- Hearing, canals normal     Nose- Clear, No- Septal dev, mucus, polyps, erosion, perforation             Throat- Mallampati III , mucosa clear , drainage- none, tonsils- atrophic Neck- flexible , trachea midline, no stridor , thyroid nl, carotid no bruit Chest - symmetrical excursion , unlabored           Heart/CV- RRR , no murmur , no gallop  , no rub, nl s1 s2                           - JVD- none , edema- none, stasis changes- none, varices- none  Lung- clear to P&A, wheeze- none, cough- none , dullness-none, rub- none           Chest wall-  Abd- tender-no, distended-no, bowel sounds-present, HSM- no Br/ Gen/ Rectal- Not done, not indicated Extrem- cyanosis- none, clubbing, none, atrophy- none,      +contracted fingers.Orthopedic shoes. Neuro- grossly intact to observation

## 2011-09-19 ENCOUNTER — Encounter: Payer: Self-pay | Admitting: Internal Medicine

## 2011-09-19 NOTE — Assessment & Plan Note (Signed)
Chronic bronchitis with retained secretions. He is adequately controlled as long as he uses his pulmonary toilet devices. We will get him a replacement Flutter.

## 2011-09-30 ENCOUNTER — Telehealth: Payer: Self-pay | Admitting: Internal Medicine

## 2011-09-30 NOTE — Telephone Encounter (Signed)
Patient made lab appt for 10/02/11.

## 2011-09-30 NOTE — Telephone Encounter (Signed)
CMP CBC-- dx Rheumatoid arthritis

## 2011-09-30 NOTE — Telephone Encounter (Signed)
Patient states that he usually has labs done here prior to seeing his rheumatologist in [place]. I need to know what labs to put in and dx. Please assist.

## 2011-09-30 NOTE — Telephone Encounter (Signed)
Please advise 

## 2011-10-01 ENCOUNTER — Other Ambulatory Visit: Payer: Self-pay | Admitting: Internal Medicine

## 2011-10-01 DIAGNOSIS — M069 Rheumatoid arthritis, unspecified: Secondary | ICD-10-CM

## 2011-10-02 ENCOUNTER — Other Ambulatory Visit (INDEPENDENT_AMBULATORY_CARE_PROVIDER_SITE_OTHER): Payer: BC Managed Care – PPO

## 2011-10-02 DIAGNOSIS — M069 Rheumatoid arthritis, unspecified: Secondary | ICD-10-CM

## 2011-10-03 LAB — CBC WITH DIFFERENTIAL/PLATELET
Basophils Relative: 0.5 % (ref 0.0–3.0)
Eosinophils Relative: 1.2 % (ref 0.0–5.0)
MCV: 95.9 fl (ref 78.0–100.0)
Monocytes Absolute: 0.7 10*3/uL (ref 0.1–1.0)
Monocytes Relative: 7.2 % (ref 3.0–12.0)
Neutrophils Relative %: 76.2 % (ref 43.0–77.0)
Platelets: 201 10*3/uL (ref 150.0–400.0)
RBC: 4.49 Mil/uL (ref 4.22–5.81)
WBC: 9.9 10*3/uL (ref 4.5–10.5)

## 2011-10-03 LAB — COMPREHENSIVE METABOLIC PANEL
Albumin: 3.9 g/dL (ref 3.5–5.2)
Alkaline Phosphatase: 86 U/L (ref 39–117)
BUN: 17 mg/dL (ref 6–23)
CO2: 29 mEq/L (ref 19–32)
GFR: 118.48 mL/min (ref >60.00)
Glucose, Bld: 81 mg/dL (ref 70–99)
Potassium: 4.4 mEq/L (ref 3.5–5.1)
Total Bilirubin: 0.6 mg/dL (ref 0.3–1.2)
Total Protein: 7.1 g/dL (ref 6.0–8.3)

## 2011-10-07 ENCOUNTER — Other Ambulatory Visit: Payer: Self-pay | Admitting: Family Medicine

## 2011-10-08 NOTE — Telephone Encounter (Signed)
Refill done.  

## 2011-10-11 DIAGNOSIS — M3500 Sicca syndrome, unspecified: Secondary | ICD-10-CM | POA: Insufficient documentation

## 2011-10-29 ENCOUNTER — Other Ambulatory Visit: Payer: Self-pay | Admitting: Internal Medicine

## 2011-10-30 NOTE — Telephone Encounter (Signed)
Refill done.  

## 2011-11-21 ENCOUNTER — Telehealth: Payer: Self-pay | Admitting: Internal Medicine

## 2011-11-21 NOTE — Telephone Encounter (Signed)
Please advise 

## 2011-11-21 NOTE — Telephone Encounter (Signed)
Patient called & says he read an article in the paper stating elderly patient should inquire about a whooping cough booster. Patient wants to know if you recommend he get one. Please call on cell [phone]

## 2011-11-23 NOTE — Telephone Encounter (Signed)
Schedule a Tdap at his convenience  

## 2011-11-23 NOTE — Telephone Encounter (Signed)
Schedule a Tdap at his convenience

## 2011-11-25 NOTE — Telephone Encounter (Signed)
Called patient advised what he needed was a TDAP. He doesn't drive so he will call back to make an appointment when his wife returns. He stated he also would make an appt., for his spouse for the same vaccination.

## 2011-11-25 NOTE — Telephone Encounter (Signed)
Can you please call & schedule pt. Thank you!

## 2011-12-02 ENCOUNTER — Telehealth: Payer: Self-pay | Admitting: Internal Medicine

## 2011-12-02 DIAGNOSIS — J449 Chronic obstructive pulmonary disease, unspecified: Secondary | ICD-10-CM

## 2011-12-02 NOTE — Telephone Encounter (Signed)
Pt called back to let us know that [employer] is the DME company via [place]. Will place order to [facility] to D/C O2 through [facility] and start O2 through [facility]. Pt is aware order placed.

## 2011-12-02 NOTE — Telephone Encounter (Signed)
Pt reports that [facility] is no longer accepted by [employer] to supply his O2.  PT is checking with [employer] to see what company he can use and he will call us back with this info.

## 2011-12-23 ENCOUNTER — Telehealth: Payer: Self-pay | Admitting: Internal Medicine

## 2011-12-23 MED ORDER — ONETOUCH ULTRASOFT LANCETS MISC: Status: AC

## 2011-12-23 MED ORDER — GLUCOSE BLOOD VI STRP: ORAL_STRIP | Status: DC

## 2011-12-23 NOTE — Telephone Encounter (Signed)
Refill: Onetouch ultrasoft lancets mis life. Qty 100. Test blood sugar 2 times a day. Last fill 08-01-08 Onetouch ultra test strips life. Qty 50. Test blood sugar twice daily. Last fill 10-08-11

## 2011-12-23 NOTE — Telephone Encounter (Signed)
Refill done.  

## 2012-01-13 ENCOUNTER — Other Ambulatory Visit: Payer: Self-pay | Admitting: *Deleted

## 2012-01-13 MED ORDER — METOPROLOL TARTRATE 25 MG PO TABS: 25.0000 mg | ORAL_TABLET | Freq: Two times a day (BID) | ORAL | Status: DC

## 2012-01-15 ENCOUNTER — Other Ambulatory Visit: Payer: Self-pay | Admitting: Cardiology

## 2012-01-15 MED ORDER — FUROSEMIDE 20 MG PO TABS: ORAL_TABLET | ORAL | Status: DC

## 2012-02-03 ENCOUNTER — Other Ambulatory Visit: Payer: Self-pay | Admitting: Internal Medicine

## 2012-02-03 NOTE — Telephone Encounter (Signed)
Refill done.  

## 2012-02-10 ENCOUNTER — Other Ambulatory Visit: Payer: Self-pay | Admitting: Cardiology

## 2012-02-10 NOTE — Telephone Encounter (Signed)
..   Requested Prescriptions   Pending Prescriptions Disp Refills  . metoprolol tartrate (LOPRESSOR) 25 MG tablet [Pharmacy Med Name: METOPROLOL TARTRATE 25 MG TAB CARA] 60 tablet 0    Sig: TAKE 1 TABLET BY MOUTH TWICE DAILY  . furosemide (LASIX) 20 MG tablet [Pharmacy Med Name: FUROSEMIDE 20 MG TAB QUAL] 60 tablet 0    Sig: TAKE TWO TABLETS BY MOUTH IN THE MORNING AND ONE IN THE EVENING  Patient needs to keep appointment on 7/2//13

## 2012-02-13 ENCOUNTER — Telehealth: Payer: Self-pay | Admitting: Internal Medicine

## 2012-02-13 NOTE — Telephone Encounter (Signed)
Spoke with [employer], Joseph [name] this am in ref to Joseph [name], pt prior DME co [facility], no longer in network with [employer], (see phone note 12/02/11) pt new dme company [employer] and new order was sent, as well as order to d/c 02 from [facility].  Per [facility] pt did not let them come in on April, says he has 02. After researching pt has 02, capped so owns machine,  Per Joseph [name], pt was informed  Of dme benefits, deductible and co ins.  [facility] is out of network, and he would have to pay out of pocket for 02.  Joseph [name] is in network with his insurance, cmn was refaxed to [place] today, and they have received.  Pt was informed by Joseph [name] yesterday we were working on this.

## 2012-02-14 ENCOUNTER — Other Ambulatory Visit: Payer: Self-pay | Admitting: Internal Medicine

## 2012-02-14 MED ORDER — OMEPRAZOLE 20 MG PO CPDR: 20.0000 mg | DELAYED_RELEASE_CAPSULE | Freq: Every day | ORAL | Status: DC

## 2012-02-14 NOTE — Telephone Encounter (Signed)
Refill done.  

## 2012-02-14 NOTE — Telephone Encounter (Signed)
refill Prilosec OTC, pr patient per pharmacy [facility], we have not responded to their request. i do not see anything in the system showing a refill request, patient wants to know if you can send today please Can call patient back at [phone] [facility] - [place], [place] - [address]

## 2012-02-18 ENCOUNTER — Encounter: Payer: Self-pay | Admitting: Cardiology

## 2012-02-18 ENCOUNTER — Ambulatory Visit (INDEPENDENT_AMBULATORY_CARE_PROVIDER_SITE_OTHER): Payer: BC Managed Care – PPO | Admitting: Cardiology

## 2012-02-18 VITALS — BP 118/72 | HR 69 | Ht 74.0 in | Wt 239.0 lb

## 2012-02-18 DIAGNOSIS — I2510 Atherosclerotic heart disease of native coronary artery without angina pectoris: Secondary | ICD-10-CM

## 2012-02-18 DIAGNOSIS — I6529 Occlusion and stenosis of unspecified carotid artery: Secondary | ICD-10-CM

## 2012-02-18 DIAGNOSIS — I10 Essential (primary) hypertension: Secondary | ICD-10-CM

## 2012-02-18 DIAGNOSIS — E785 Hyperlipidemia, unspecified: Secondary | ICD-10-CM

## 2012-02-18 MED ORDER — POTASSIUM CHLORIDE CRYS ER 20 MEQ PO TBCR: 20.0000 meq | EXTENDED_RELEASE_TABLET | Freq: Two times a day (BID) | ORAL | Status: DC

## 2012-02-18 MED ORDER — METOPROLOL TARTRATE 25 MG PO TABS: 25.0000 mg | ORAL_TABLET | Freq: Two times a day (BID) | ORAL | Status: DC

## 2012-02-18 MED ORDER — NITROGLYCERIN 0.4 MG SL SUBL: 0.4000 mg | SUBLINGUAL_TABLET | SUBLINGUAL | Status: DC | PRN

## 2012-02-18 MED ORDER — FUROSEMIDE 20 MG PO TABS: 20.0000 mg | ORAL_TABLET | Freq: Two times a day (BID) | ORAL | Status: DC

## 2012-02-18 MED ORDER — ATORVASTATIN CALCIUM 80 MG PO TABS: 80.0000 mg | ORAL_TABLET | Freq: Every day | ORAL | Status: DC

## 2012-02-18 NOTE — Assessment & Plan Note (Signed)
The blood pressure is at target. No change in medications is indicated. We will continue with therapeutic lifestyle changes (TLC).  

## 2012-02-18 NOTE — Assessment & Plan Note (Signed)
He will have a lipid profile drawn at Dr. Leta Jungling office. The goal will be an LDL less than 70 and HDL greater than 40.

## 2012-02-18 NOTE — Patient Instructions (Addendum)
The current medical regimen is effective;  continue present plan and medications.  Please have blood work as ordered  Follow up in 1 year with Dr Antoine Poche.  You will receive a letter in the mail 2 months before you are due.  Please call us when you receive this letter to schedule your follow up appointment.

## 2012-02-18 NOTE — Progress Notes (Signed)
HPI The patient presents follow up of CAD with bypass in 2007. Over the past year he's done well from a cardiovascular standpoint. He seems to finally be over some foot surgeries that he's been having for his diabetic feet. He's able to do a little more walking. The patient denies any new symptoms such as chest discomfort, neck or arm discomfort. There has been no new shortness of breath, PND or orthopnea. There have been no reported palpitations, presyncope or syncope.    No Known Allergies  Current Outpatient Prescriptions  Medication Sig Dispense Refill  . albuterol (PROVENTIL) (2.5 MG/3ML) 0.083% nebulizer solution 2.5 mg.        . albuterol (PROVENTIL,VENTOLIN) 90 MCG/ACT inhaler Inhale 2 puffs into the lungs every 4 (four) hours as needed for wheezing or shortness of breath.  17 g  prn  . alendronate (FOSAMAX) 70 MG tablet Take 1 tablet (70 mg total) by mouth every 7 (seven) days. Take with a full glass of water on an empty stomach.  4 tablet  3  . amitriptyline (ELAVIL) 75 MG tablet Take 150 mg by mouth at bedtime.        . Ascorbic Acid (VITAMIN C) 500 MG tablet Take 500 mg by mouth daily.        Marland Kitchen aspirin 81 MG tablet Take 81 mg by mouth daily.        Marland Kitchen atorvastatin (LIPITOR) 80 MG tablet TAKE ONE TABLET EACH NIGHT AT BEDTIME  30 tablet  0  . calamine ([facility] CALAMINE) lotion        . Carbonyl Iron (PERFECT IRON PO) Take by mouth. 1 tab daily       . Fluticasone-Salmeterol (ADVAIR DISKUS) 100-50 MCG/DOSE AEPB Inhale 1 puff into the lungs 2 (two) times daily. 1 puff and rinse twice daily  180 each  3  . folic acid (FOLVITE) 400 MCG tablet Take 800 mcg by mouth daily.       . furosemide (LASIX) 20 MG tablet TAKE TWO TABLETS BY MOUTH IN THE MORNING AND ONE IN THE EVENING  60 tablet  0  . glucose blood (ONE TOUCH ULTRA TEST) test strip Use as instructed  50 each  0  . Lancets (ONETOUCH ULTRASOFT) lancets Use as instructed  100 each  6  . methotrexate (RHEUMATREX) 2.5 MG tablet Take 8  tablets every week       . metoprolol tartrate (LOPRESSOR) 25 MG tablet TAKE 1 TABLET BY MOUTH TWICE DAILY  60 tablet  0  . Misc. Devices (ACAPELLA) MISC Use flutter device as directed  1 each  0  . morphine (MS CONTIN) 30 MG 12 hr tablet Take 1 tablet by mouth daily.      Marland Kitchen NITROSTAT 0.4 MG SL tablet PLACE ONE (1) TABLET(S) UND ER THE TONGUE AS NEEDED F OR CHEST PAIN  25 each  3  . NON FORMULARY oxygen 2 litters 24 hours a day      . omeprazole (PRILOSEC) 20 MG capsule Take 1 capsule (20 mg total) by mouth daily.  60 capsule  0  . oxyCODONE-acetaminophen (PERCOCET) 7.5-325 MG per tablet Take 1 tablet by mouth. 3 times a day      . potassium chloride SA (K-DUR,KLOR-CON) 20 MEQ tablet 2 by mouth in the morning and 1 by mouth at 4 pm      . predniSONE (DELTASONE) 10 MG tablet pack Takes 5.5mg  qod and 5mg  qod      . pregabalin (LYRICA) 150 MG  capsule Take 150 mg by mouth 3 (three) times daily.        Marland Kitchen Respiratory Therapy Supplies (FLUTTER) DEVI Blow through several times, 2 or 3 sets per day as needed  1 each  0  . tiotropium (SPIRIVA HANDIHALER) 18 MCG inhalation capsule Place 1 capsule (18 mcg total) into inhaler and inhale daily. 1 puff in AM  90 capsule  3  . UNABLE TO FIND Med Name: Incentive Spirometer Use as directed  1 Device  0  . vitamin B-12 (CYANOCOBALAMIN) 250 MCG tablet Take 250 mcg by mouth daily.        Marland Kitchen DISCONTD: omeprazole (PRILOSEC OTC) 20 MG tablet Take 1 tablet (20 mg total) by mouth daily.  60 tablet  0    Past Medical History  Diagnosis Date  . Rheumatoid arthritis   . Psoriasis   . Hypertension   . Gastroesophageal reflux disease   . COPD (chronic obstructive pulmonary disease)   . CAD (coronary artery disease)     MI 2007, CABG  . Osteoporosis     per DEXA 05-2008  . Peripheral polyneuropathy     Severe, causing problems with his feet, see surgeries  . Diabetes mellitus     Dx 07-2008 A1C 6.2  . Gastritis     Past Surgical History  Procedure Date  . Foot  surgery     to tendon release and repair of osteomyelitis   . Cholecystectomy   . Coronary artery bypass graft 2007    (LIMA to the LAD, left radial to obtuse marginal, SVG to first diagonal, SVG to PDA. His ejection fraction to 50-55%  . Achiles tendon surgery 8/09  . 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   PHYSICAL EXAM General: Well developed, well nourished, in no acute distress.  Head: normocephalic and atraumatic  Eyes: PERRLA/EOM intact; conjunctiva and lids normal.  Mouth: Teeth, gums and palate normal. Oral mucosa normal.  Neck: Neck supple, no JVD. No masses, thyromegaly or abnormal cervical nodes.  Chest Wall: well-healed sternotomy scar  Lungs: Clear bilaterally to auscultation and percussion.  Heart: S1 and S2 within normal limits, no S3, no S4, no clicks, no rubs, no murmurs  Abdomen: Bowel sounds positive; abdomen soft and non-tender without masses, organomegaly, or hernias noted. No hepatosplenomegaly, obese  Msk: severe rheumatoid changes  Extremities: trace bilateral edema, his legs are in diabetic boots and I was unable to appreciate posterior tibialis or dorsalis pulses  Neurologic: Alert and oriented x 3.  Skin: Intact without lesions or rashes.  Psych: Normal affect.  EKG:  Sinus rhythm, rate 69, axis within normal limits, intervals within normal limits, no acute ST-T wave changes.  02/18/2012   ASSESSMENT AND PLAN

## 2012-02-18 NOTE — Assessment & Plan Note (Signed)
The patient has no new sypmtoms.  No further cardiovascular testing is indicated.  We will continue with aggressive risk reduction and meds as listed.  

## 2012-02-18 NOTE — Assessment & Plan Note (Signed)
He had mild bilateral plaque in April of last year follow up in April of next year.

## 2012-02-20 ENCOUNTER — Other Ambulatory Visit (INDEPENDENT_AMBULATORY_CARE_PROVIDER_SITE_OTHER): Payer: BC Managed Care – PPO

## 2012-02-20 DIAGNOSIS — IMO0002 Reserved for concepts with insufficient information to code with codable children: Secondary | ICD-10-CM

## 2012-02-20 DIAGNOSIS — E7800 Pure hypercholesterolemia, unspecified: Secondary | ICD-10-CM

## 2012-02-20 LAB — LIPID PANEL
LDL Cholesterol: 69 mg/dL (ref 0–99)
Total CHOL/HDL Ratio: 3

## 2012-02-20 NOTE — Progress Notes (Signed)
Labs only

## 2012-02-24 ENCOUNTER — Encounter: Payer: Self-pay | Admitting: *Deleted

## 2012-02-25 ENCOUNTER — Ambulatory Visit: Payer: BC Managed Care – PPO | Admitting: Cardiology

## 2012-03-16 ENCOUNTER — Ambulatory Visit (INDEPENDENT_AMBULATORY_CARE_PROVIDER_SITE_OTHER): Payer: BC Managed Care – PPO | Admitting: Internal Medicine

## 2012-03-16 ENCOUNTER — Encounter: Payer: Self-pay | Admitting: Internal Medicine

## 2012-03-16 VITALS — BP 110/68 | HR 75 | Ht 74.0 in | Wt 245.6 lb

## 2012-03-16 DIAGNOSIS — J449 Chronic obstructive pulmonary disease, unspecified: Secondary | ICD-10-CM

## 2012-03-16 MED ORDER — TIOTROPIUM BROMIDE MONOHYDRATE 18 MCG IN CAPS: 18.0000 ug | ORAL_CAPSULE | Freq: Every day | RESPIRATORY_TRACT | Status: DC

## 2012-03-16 MED ORDER — DOXYCYCLINE HYCLATE 100 MG PO TABS: ORAL_TABLET | ORAL | Status: DC

## 2012-03-16 MED ORDER — FLUTICASONE-SALMETEROL 100-50 MCG/DOSE IN AEPB: INHALATION_SPRAY | RESPIRATORY_TRACT | Status: DC

## 2012-03-16 NOTE — Progress Notes (Signed)
Subjective:    Patient ID: Joseph [name], male    DOB: [birth year], 61 y.o.   MRN: [id number]  HPI 04/09/11- 27 yoM former smoker, followed for COPD, complicated by RA, CAD/ ASVD. Psoriasis, DM, peripheral neuropathy    Wife here Last here October 22, 2010  Oxygen 2 L continuous. He says as long as he uses the Flutter and Incentive spirometer twice daily he can keep his chest clear. He says with these he couldn't be happier and the meds work great. Sputum tends to be very thick. No panic dyspnea episodes.  We discussed vaccinations. He gets flu vax and had one pneumovax in 2007. He is interested in getting a booster- discussed.  On prednisone 5 alternating with 6 mg daily. Had chemical stress test ok for foot surgery.   09/16/11- 89 yoM former smoker, followed for COPD, complicated by RA, CAD/ ASVD. Psoriasis, DM, peripheral neuropathy    Wife here He asks for replacement flutter device which has worked well. He has avoided catching the flu. Still fighting wounds on his feet from his rheumatoid arthritis with peripheral neuropathy. He can feel mucus rattling in his chest but if he uses both incentive spirometer and his flutter device every day he can control it.  03/16/12-  60 yoM former smoker, followed for COPD, complicated by RA, CAD/ ASVD. Psoriasis, DM, peripheral neuropathy  Wife here. Pt states increase in phlegm x 2 weeks.using flutter valve . increase sob,wheezing upon activity .  COPD assessment test (CAT) score 12/40. For the past few weeks he has had increased phlegm. Flutter not as effective as usual. Low-grade fever, creamy sputum. Doing better in the last few days and sputum is clear. Uses home O2 at 2-3 L/ [facility].  Review of Systems- see HPI Constitutional:   No-   weight loss, night sweats, fevers, chills, fatigue, lassitude. HEENT:   No-  headaches, difficulty swallowing, tooth/dental problems, sore throat,       No-  sneezing, itching, ear ache, nasal congestion,  post nasal drip,  CV:  No-   chest pain, orthopnea, PND, swelling in lower extremities, anasarca, dizziness, palpitations Resp: + shortness of breath with exertion or at rest.              +  productive cough,  No non-productive cough,  No-  coughing up of blood.              No-   change in color of mucus.  No- wheezing.   Skin: No-   rash or lesions. GI: No complaints                 GU: No complaints MS: + diffuse arthritic pain. Neuro- nothing unusual Psych:  No- change in mood or affect. No depression or anxiety.  No memory loss.  Objective:   Physical Exam General- Alert, Oriented, Affect-appropriate, Distress- none acute          O2 portable 2 L/M  97% Skin- rash-none, lesions- none, excoriation- none Lymphadenopathy- none Head- atraumatic            Eyes- Gross vision intact, PERRLA, conjunctivae clear secretions            Ears- Hearing, canals normal     Nose- Clear, No- Septal dev, mucus, polyps, erosion, perforation             Throat- Mallampati III , mucosa clear , drainage- none, tonsils- atrophic Neck- flexible , trachea midline, no stridor , thyroid  nl, carotid no bruit Chest - symmetrical excursion , unlabored           Heart/CV- RRR , no murmur , no gallop  , no rub, nl s1 s2                           - JVD- none , edema- none, stasis changes- none, varices- none           Lung- a few faint rhonchi, unlabored, wheeze- none, cough- none , dullness-none, rub- none           Chest wall-  Abd-  Br/ Gen/ Rectal- Not done, not indicated Extrem- cyanosis- none, clubbing, none, atrophy- none, +contracted fingers.Orthopedic shoes. Neuro- grossly intact to observation

## 2012-03-16 NOTE — Patient Instructions (Addendum)
Scripts sent for refillable script for doxycycline, and for Advair and Spriva refills  Try Mucinex as needed to thin mucus.

## 2012-03-19 ENCOUNTER — Encounter: Payer: Self-pay | Admitting: Internal Medicine

## 2012-03-19 NOTE — Assessment & Plan Note (Signed)
Acute bronchitis. Plan-Mucinex. Doxycycline with refills to hold, fluids

## 2012-03-25 ENCOUNTER — Other Ambulatory Visit (HOSPITAL_BASED_OUTPATIENT_CLINIC_OR_DEPARTMENT_OTHER): Payer: Self-pay | Admitting: Physical Medicine and Rehabilitation

## 2012-03-25 ENCOUNTER — Ambulatory Visit (HOSPITAL_BASED_OUTPATIENT_CLINIC_OR_DEPARTMENT_OTHER)
Admission: RE | Admit: 2012-03-25 | Discharge: 2012-03-25 | Disposition: A | Discharge status: Home / Self Care | Payer: BC Managed Care – PPO | Source: Ambulatory Visit | Attending: Physical Medicine and Rehabilitation | Admitting: Physical Medicine and Rehabilitation

## 2012-03-25 DIAGNOSIS — T148XXA Other injury of unspecified body region, initial encounter: Secondary | ICD-10-CM

## 2012-03-25 DIAGNOSIS — M19079 Primary osteoarthritis, unspecified ankle and foot: Secondary | ICD-10-CM | POA: Insufficient documentation

## 2012-03-25 DIAGNOSIS — R609 Edema, unspecified: Secondary | ICD-10-CM | POA: Insufficient documentation

## 2012-03-25 DIAGNOSIS — M79609 Pain in unspecified limb: Secondary | ICD-10-CM | POA: Insufficient documentation

## 2012-04-12 ENCOUNTER — Other Ambulatory Visit: Payer: Self-pay | Admitting: Internal Medicine

## 2012-04-13 NOTE — Telephone Encounter (Signed)
Refill done.  

## 2012-05-08 ENCOUNTER — Ambulatory Visit (INDEPENDENT_AMBULATORY_CARE_PROVIDER_SITE_OTHER): Payer: BC Managed Care – PPO | Admitting: Internal Medicine

## 2012-05-08 VITALS — BP 120/72 | HR 74 | Temp 98.3°F | Ht 71.0 in | Wt 240.0 lb

## 2012-05-08 DIAGNOSIS — Z23 Encounter for immunization: Secondary | ICD-10-CM

## 2012-05-08 DIAGNOSIS — E785 Hyperlipidemia, unspecified: Secondary | ICD-10-CM

## 2012-05-08 DIAGNOSIS — E119 Type 2 diabetes mellitus without complications: Secondary | ICD-10-CM

## 2012-05-08 DIAGNOSIS — Z0000 Encounter for general adult medical examination without abnormal findings: Secondary | ICD-10-CM | POA: Insufficient documentation

## 2012-05-08 DIAGNOSIS — K219 Gastro-esophageal reflux disease without esophagitis: Secondary | ICD-10-CM

## 2012-05-08 LAB — COMPREHENSIVE METABOLIC PANEL
ALT: 97 U/L — ABNORMAL HIGH (ref 0–53)
Albumin: 3.9 g/dL (ref 3.5–5.2)
Alkaline Phosphatase: 81 U/L (ref 39–117)
CO2: 26 mEq/L (ref 19–32)
GFR: 118.24 mL/min (ref >60.00)
Potassium: 4.1 mEq/L (ref 3.5–5.1)
Sodium: 138 mEq/L (ref 135–145)
Total Bilirubin: 0.7 mg/dL (ref 0.3–1.2)
Total Protein: 6.9 g/dL (ref 6.0–8.3)

## 2012-05-08 LAB — CBC WITH DIFFERENTIAL/PLATELET
Basophils Absolute: 0 10*3/uL (ref 0.0–0.1)
HCT: 43.9 % (ref 39.0–52.0)
Lymphs Abs: 0.8 10*3/uL (ref 0.7–4.0)
MCV: 95.9 fl (ref 78.0–100.0)
Monocytes Absolute: 0.7 10*3/uL (ref 0.1–1.0)
Platelets: 216 10*3/uL (ref 150.0–400.0)
RDW: 15.7 % — ABNORMAL HIGH (ref 11.5–14.6)

## 2012-05-08 LAB — HEMOGLOBIN A1C: Hgb A1c MFr Bld: 6.1 % (ref 4.6–6.5)

## 2012-05-08 NOTE — Patient Instructions (Signed)
Use MOM as needed for heartburn, if problem increase, let me know

## 2012-05-08 NOTE — Assessment & Plan Note (Signed)
Td last? Getting one  today pneumonia shot 2007 and 2012  flu shot today  He is inmunosupressed, no zostavax at this point never had a colonoscopy,  Interested in one---->  referral done labs

## 2012-05-08 NOTE — Assessment & Plan Note (Signed)
Well controlled  Per last FLP, no change

## 2012-05-08 NOTE — Assessment & Plan Note (Signed)
Due for labs

## 2012-05-08 NOTE — Progress Notes (Signed)
  Subjective:    Patient ID: Joseph [name], male    DOB: February 22, 1951, 61 y.o.   MRN: [id number]  HPI CPX  Past Medical History: AODM Dx 07-2008 A1C 6.2 CAD, MI 02-02-2006, CABG --- Dr Luciano Cutter  HTN RHEUMATOID ARTHRITIS PERIPHERAL POLYNEUROPATHY (related to RA per neuro note 05-2011) Wound L foot, osteomyelitis s/p surgery 03-2010----f/u orthopedics and Wound care center at [facility] Dr Samuel Bouche  PSORIASIS  H/o GASTRITIS   COPD-- 02------Dr Maple Hudson  Osteoporosis by DEXA 10-09  Past Surgical History: CABG 02/02/06 ((LIMA to the LAD, left radial to obtuse marginal, SVG to first diagonal, SVG to PDA.  His ejection fraction is 50-55%),  L achiles tendon surgery 8-09 Cholecystectomy 2-11 Toe amputation 4-12 :d/t a deformity, found to have osteomyelitis, complicated by post-op foot strtess FX  Foot surgery for tendon release and repair of osteomyelitis   Family History: heart disease-- F (2 MIs, died 02-03-2012 at age 67),  M DM--no CHF-- M Colon ca-- no prostate ca--no  Social History: Married, No children  Former smoker.  Quit in 02-Feb-2006.  Smoked for 40 years at 1 ppd ETOH--no disabled-retired  Diet-- healthy Exercise- limited by comorbidities   Review of Systems Good compliance with MTX and prednisone History of CAD, denies chest pain, has nitroglycerin but has never used it. History of COPD, shortness of breath at baseline. No nausea, vomiting, diarrhea or blood in the stools. Sporadically has severe heartburn only at night. Denies dysuria, difficulty urinating or gross hematuria.     Objective:   Physical Exam  General -- alert, well-developed, and overweight appearing. No apparent distress.  Lungs --decreased breath sounds otherwise clear, no increased work of breathing   Heart-- normal rate, regular rhythm, no murmur, and no gallop.   Abdomen--soft, non-tender, no distention, no masses, no HSM, no guarding, and no rigidity.   DIABETIC FEET EXAM: Trace lower extremity edema Normal pedal  pulses bilaterally S/p extensive surgery DRE -- normal prostate, no rectal mass, brown stools , hemocult (-) Psych-- Cognition and judgment appear intact. Alert and cooperative with normal attention span and concentration.  not anxious appearing and not depressed appearing.      Assessment & Plan:

## 2012-05-08 NOTE — Assessment & Plan Note (Signed)
Episodic nighttime heartburn, rec prn MOM for now, advised to call if sx increase

## 2012-05-13 ENCOUNTER — Other Ambulatory Visit: Payer: Self-pay | Admitting: Internal Medicine

## 2012-05-14 NOTE — Telephone Encounter (Signed)
Refill done.  

## 2012-05-15 ENCOUNTER — Encounter: Payer: Self-pay | Admitting: *Deleted

## 2012-05-25 ENCOUNTER — Encounter: Payer: Self-pay | Admitting: Internal Medicine

## 2012-06-08 ENCOUNTER — Telehealth: Payer: Self-pay | Admitting: Internal Medicine

## 2012-06-08 NOTE — Telephone Encounter (Signed)
Noted  

## 2012-06-08 NOTE — Telephone Encounter (Signed)
In reference to Gastroenterology referral entered on 05/08/12, [facility], and I have tried to reach patient multiple times, and I mailed letter.  The patient will not respond.

## 2012-06-13 ENCOUNTER — Telehealth: Payer: Self-pay | Admitting: Internal Medicine

## 2012-06-13 DIAGNOSIS — R7989 Other specified abnormal findings of blood chemistry: Secondary | ICD-10-CM

## 2012-06-13 NOTE — Telephone Encounter (Signed)
Please schedule labs: LFTs --- dx increased LFTs Also get at [facility] in case we need further blood work

## 2012-06-15 ENCOUNTER — Encounter: Payer: Self-pay | Admitting: *Deleted

## 2012-06-15 NOTE — Telephone Encounter (Signed)
Letter mailed notifying pt of appt & lab orders entered.

## 2012-06-19 ENCOUNTER — Telehealth: Payer: Self-pay

## 2012-06-19 NOTE — Telephone Encounter (Signed)
Pt husband called in to schedule  Lab appts for both of them 06/22/12 8:30 am       MW

## 2012-06-22 ENCOUNTER — Other Ambulatory Visit (INDEPENDENT_AMBULATORY_CARE_PROVIDER_SITE_OTHER): Payer: BC Managed Care – PPO

## 2012-06-22 DIAGNOSIS — R7401 Elevation of levels of liver transaminase levels: Secondary | ICD-10-CM

## 2012-06-22 DIAGNOSIS — R7989 Other specified abnormal findings of blood chemistry: Secondary | ICD-10-CM

## 2012-06-22 DIAGNOSIS — R7402 Elevation of levels of lactic acid dehydrogenase (LDH): Secondary | ICD-10-CM

## 2012-06-22 DIAGNOSIS — R748 Abnormal levels of other serum enzymes: Secondary | ICD-10-CM

## 2012-06-22 LAB — AST: AST: 24 U/L (ref 0–37)

## 2012-06-22 LAB — HEPATIC FUNCTION PANEL
Albumin: 3.3 g/dL — ABNORMAL LOW (ref 3.5–5.2)
Total Protein: 6.5 g/dL (ref 6.0–8.3)

## 2012-06-22 LAB — ALT: ALT: 31 U/L (ref 0–53)

## 2012-06-25 ENCOUNTER — Encounter: Payer: Self-pay | Admitting: *Deleted

## 2012-09-14 ENCOUNTER — Encounter: Payer: Self-pay | Admitting: Internal Medicine

## 2012-09-14 ENCOUNTER — Ambulatory Visit (INDEPENDENT_AMBULATORY_CARE_PROVIDER_SITE_OTHER)
Admission: RE | Admit: 2012-09-14 | Discharge: 2012-09-14 | Disposition: A | Discharge status: Home / Self Care | Payer: BC Managed Care – PPO | Source: Ambulatory Visit | Attending: Internal Medicine | Admitting: Internal Medicine

## 2012-09-14 ENCOUNTER — Ambulatory Visit (INDEPENDENT_AMBULATORY_CARE_PROVIDER_SITE_OTHER): Payer: BC Managed Care – PPO | Admitting: Internal Medicine

## 2012-09-14 ENCOUNTER — Other Ambulatory Visit (INDEPENDENT_AMBULATORY_CARE_PROVIDER_SITE_OTHER): Payer: BC Managed Care – PPO

## 2012-09-14 VITALS — BP 122/80 | HR 71 | Ht 74.0 in | Wt 253.6 lb

## 2012-09-14 DIAGNOSIS — J449 Chronic obstructive pulmonary disease, unspecified: Secondary | ICD-10-CM

## 2012-09-14 DIAGNOSIS — R7401 Elevation of levels of liver transaminase levels: Secondary | ICD-10-CM

## 2012-09-14 DIAGNOSIS — R748 Abnormal levels of other serum enzymes: Secondary | ICD-10-CM

## 2012-09-14 DIAGNOSIS — R7402 Elevation of levels of lactic acid dehydrogenase (LDH): Secondary | ICD-10-CM

## 2012-09-14 LAB — HEPATIC FUNCTION PANEL: Total Bilirubin: 0.4 mg/dL (ref 0.3–1.2)

## 2012-09-14 NOTE — Progress Notes (Signed)
Subjective:    Patient ID: Joseph [name], male    DOB: [birth year], 62 y.o.   MRN: [id number]  HPI 04/09/11- 32 yoM former smoker, followed for COPD, complicated by RA, CAD/ ASVD. Psoriasis, DM, peripheral neuropathy    Wife here Last here October 22, 2010  Oxygen 2 L continuous. He says as long as he uses the Flutter and Incentive spirometer twice daily he can keep his chest clear. He says with these he couldn't be happier and the meds work great. Sputum tends to be very thick. No panic dyspnea episodes.  We discussed vaccinations. He gets flu vax and had one pneumovax in 2007. He is interested in getting a booster- discussed.  On prednisone 5 alternating with 6 mg daily. Had chemical stress test ok for foot surgery.   09/16/11- 37 yoM former smoker, followed for COPD, complicated by RA, CAD/ ASVD. Psoriasis, DM, peripheral neuropathy    Wife here He asks for replacement flutter device which has worked well. He has avoided catching the flu. Still fighting wounds on his feet from his rheumatoid arthritis with peripheral neuropathy. He can feel mucus rattling in his chest but if he uses both incentive spirometer and his flutter device every day he can control it.  03/16/12-  60 yoM former smoker, followed for COPD, complicated by RA, CAD/ ASVD. Psoriasis, DM, peripheral neuropathy  Wife here. Pt states increase in phlegm x 2 weeks.using flutter valve . increase sob,wheezing upon activity .  COPD assessment test (CAT) score 12/40. For the past few weeks he has had increased phlegm. Flutter not as effective as usual. Low-grade fever, creamy sputum. Doing better in the last few days and sputum is clear. Uses home O2 at 2-3 L/ [facility].  09/14/12- 61 yoM former smoker, followed for COPD, complicated by RA, CAD/ ASVD. Psoriasis, DM, peripheral neuropathy      Wife here FOLLOWS FOR: pt reports breathing is much better since last visit-- using flutter valve and incentive spirometer to help loosen  mucus-- will need a new DME company (insurance no longer covers w previous one)  O2 concentrator at 2 L continuous portable and standard concentrators. He depends on pulmonary toilet devices to minimize his cough. Works all day to get up some thick green mucus. Chronic antibiotic for infected toes just finished after 2 weeks of Keflex.  Review of Systems- see HPI Constitutional:   No-   weight loss, night sweats, fevers, chills, fatigue, lassitude. HEENT:   No-  headaches, difficulty swallowing, tooth/dental problems, sore throat,       No-  sneezing, itching, ear ache, nasal congestion, post nasal drip,  CV:  No-   chest pain, orthopnea, PND, swelling in lower extremities, anasarca, dizziness, palpitations Resp: + shortness of breath with exertion or at rest.              +  productive cough,  + non-productive cough,  No-  coughing up of blood.              +   change in color of mucus.  No- wheezing.   Skin: No-   rash or lesions. GI: No complaints GU: No complaints MS: + diffuse arthritic pain. Neuro- nothing unusual Psych:  No- change in mood or affect. No depression or anxiety.  No memory loss.  Objective:   Physical Exam General- Alert, Oriented, Affect-appropriate, Distress- none acute          O2 portable 2 L/M  97% Skin- rash-none, lesions-  none, excoriation- none Lymphadenopathy- none Head- atraumatic            Eyes- Gross vision intact, PERRLA, conjunctivae clear secretions            Ears- Hearing, canals normal     Nose- Clear, No- Septal dev, mucus, polyps, erosion, perforation             Throat- Mallampati III , mucosa clear , drainage- none, tonsils- atrophic Neck- flexible , trachea midline, no stridor , thyroid nl, carotid no bruit Chest - symmetrical excursion , unlabored           Heart/CV- RRR , no murmur , no gallop  , no rub, nl s1 s2                           - JVD- none , edema- none, stasis changes- none, varices- none           Lung- a+ a few slight  crackles, unlabored, wheeze- none, cough- none , dullness-none, rub- none           Chest wall-  Abd-  Br/ Gen/ Rectal- Not done, not indicated Extrem- cyanosis- none, clubbing, none, atrophy- none, +contracted fingers. Orthopedic shoes. Neuro- grossly intact to observation

## 2012-09-14 NOTE — Patient Instructions (Addendum)
Order- CXR  Dx COPD,   Order- [facility] Please help change DME supplier to one covered with his insurance for oxygen 2 L/M continuous and portable., with supplies

## 2012-09-15 ENCOUNTER — Telehealth: Payer: Self-pay | Admitting: Internal Medicine

## 2012-09-15 NOTE — Telephone Encounter (Signed)
Pt returning call regarding cxr results per Dr. Maple Hudson and requested a copy be mailed to his home. Address verified..  Result Notes     Notes Recorded by Caryl Ada, CMA on 09/15/2012 at 11:35 AM LMTCB ------  Notes Recorded by Waymon Budge, MD on 09/14/2012 at 7:45 PM CXR- stable severe emphysema with old surgery changes, large bleb in upper left and scarring in upper right. Nothing new or active.

## 2012-09-16 ENCOUNTER — Encounter: Payer: Self-pay | Admitting: *Deleted

## 2012-09-26 NOTE — Assessment & Plan Note (Signed)
Oxygen dependent chronic respiratory failure. We will help him change DME company to work with his current insurance. He uses flutter to maintain pulmonary toilet and feels better controlled with this but still describes daily productive cough with some thick green sputum. If it persists we will try to culture since he just finished 2 weeks of Keflex.

## 2012-10-17 ENCOUNTER — Emergency Department (HOSPITAL_BASED_OUTPATIENT_CLINIC_OR_DEPARTMENT_OTHER): Payer: BC Managed Care – PPO

## 2012-10-17 ENCOUNTER — Inpatient Hospital Stay (HOSPITAL_BASED_OUTPATIENT_CLINIC_OR_DEPARTMENT_OTHER)
Admission: EM | Admit: 2012-10-17 | Discharge: 2012-10-21 | DRG: 540 | Disposition: A | Discharge status: Home / Self Care | Payer: BC Managed Care – PPO | Attending: Critical Care Medicine | Admitting: Critical Care Medicine

## 2012-10-17 ENCOUNTER — Encounter (HOSPITAL_BASED_OUTPATIENT_CLINIC_OR_DEPARTMENT_OTHER): Payer: Self-pay | Admitting: Emergency Medicine

## 2012-10-17 DIAGNOSIS — L408 Other psoriasis: Secondary | ICD-10-CM

## 2012-10-17 DIAGNOSIS — IMO0002 Reserved for concepts with insufficient information to code with codable children: Secondary | ICD-10-CM

## 2012-10-17 DIAGNOSIS — M79609 Pain in unspecified limb: Secondary | ICD-10-CM

## 2012-10-17 DIAGNOSIS — J156 Pneumonia due to other aerobic Gram-negative bacteria: Secondary | ICD-10-CM

## 2012-10-17 DIAGNOSIS — M359 Systemic involvement of connective tissue, unspecified: Secondary | ICD-10-CM

## 2012-10-17 DIAGNOSIS — G609 Hereditary and idiopathic neuropathy, unspecified: Secondary | ICD-10-CM | POA: Diagnosis present

## 2012-10-17 DIAGNOSIS — J9620 Acute and chronic respiratory failure, unspecified whether with hypoxia or hypercapnia: Secondary | ICD-10-CM

## 2012-10-17 DIAGNOSIS — E119 Type 2 diabetes mellitus without complications: Secondary | ICD-10-CM

## 2012-10-17 DIAGNOSIS — I10 Essential (primary) hypertension: Secondary | ICD-10-CM | POA: Diagnosis present

## 2012-10-17 DIAGNOSIS — K3184 Gastroparesis: Secondary | ICD-10-CM

## 2012-10-17 DIAGNOSIS — J9611 Chronic respiratory failure with hypoxia: Secondary | ICD-10-CM | POA: Diagnosis present

## 2012-10-17 DIAGNOSIS — E1140 Type 2 diabetes mellitus with diabetic neuropathy, unspecified: Secondary | ICD-10-CM | POA: Diagnosis present

## 2012-10-17 DIAGNOSIS — J690 Pneumonitis due to inhalation of food and vomit: Principal | ICD-10-CM | POA: Diagnosis present

## 2012-10-17 DIAGNOSIS — J8410 Pulmonary fibrosis, unspecified: Secondary | ICD-10-CM | POA: Diagnosis present

## 2012-10-17 DIAGNOSIS — J9601 Acute respiratory failure with hypoxia: Secondary | ICD-10-CM

## 2012-10-17 DIAGNOSIS — K219 Gastro-esophageal reflux disease without esophagitis: Secondary | ICD-10-CM | POA: Diagnosis present

## 2012-10-17 DIAGNOSIS — R000 Tachycardia, unspecified: Secondary | ICD-10-CM

## 2012-10-17 DIAGNOSIS — I509 Heart failure, unspecified: Secondary | ICD-10-CM | POA: Diagnosis present

## 2012-10-17 DIAGNOSIS — E785 Hyperlipidemia, unspecified: Secondary | ICD-10-CM

## 2012-10-17 DIAGNOSIS — R931 Abnormal findings on diagnostic imaging of heart and coronary circulation: Secondary | ICD-10-CM

## 2012-10-17 DIAGNOSIS — E876 Hypokalemia: Secondary | ICD-10-CM | POA: Diagnosis present

## 2012-10-17 DIAGNOSIS — M810 Age-related osteoporosis without current pathological fracture: Secondary | ICD-10-CM

## 2012-10-17 DIAGNOSIS — I2510 Atherosclerotic heart disease of native coronary artery without angina pectoris: Secondary | ICD-10-CM

## 2012-10-17 DIAGNOSIS — J438 Other emphysema: Secondary | ICD-10-CM | POA: Diagnosis present

## 2012-10-17 DIAGNOSIS — Z951 Presence of aortocoronary bypass graft: Secondary | ICD-10-CM

## 2012-10-17 DIAGNOSIS — Z0000 Encounter for general adult medical examination without abnormal findings: Secondary | ICD-10-CM

## 2012-10-17 DIAGNOSIS — J449 Chronic obstructive pulmonary disease, unspecified: Secondary | ICD-10-CM

## 2012-10-17 DIAGNOSIS — K7689 Other specified diseases of liver: Secondary | ICD-10-CM | POA: Diagnosis present

## 2012-10-17 DIAGNOSIS — K2270 Barrett's esophagus without dysplasia: Secondary | ICD-10-CM

## 2012-10-17 DIAGNOSIS — J1569 Pneumonia due to other gram-negative bacteria: Secondary | ICD-10-CM | POA: Diagnosis present

## 2012-10-17 DIAGNOSIS — Z01818 Encounter for other preprocedural examination: Secondary | ICD-10-CM

## 2012-10-17 DIAGNOSIS — J4489 Other specified chronic obstructive pulmonary disease: Secondary | ICD-10-CM

## 2012-10-17 DIAGNOSIS — G8929 Other chronic pain: Secondary | ICD-10-CM | POA: Diagnosis present

## 2012-10-17 DIAGNOSIS — M069 Rheumatoid arthritis, unspecified: Secondary | ICD-10-CM

## 2012-10-17 DIAGNOSIS — I252 Old myocardial infarction: Secondary | ICD-10-CM

## 2012-10-17 DIAGNOSIS — R490 Dysphonia: Secondary | ICD-10-CM

## 2012-10-17 DIAGNOSIS — J189 Pneumonia, unspecified organism: Secondary | ICD-10-CM

## 2012-10-17 HISTORY — DX: Tinea unguium: B35.1

## 2012-10-17 LAB — POCT I-STAT 3, ART BLOOD GAS (G3+)
Acid-base deficit: 2 mmol/L (ref 0.0–2.0)
O2 Saturation: 83 %
Patient temperature: 98.5

## 2012-10-17 LAB — URINALYSIS, ROUTINE W REFLEX MICROSCOPIC
Ketones, ur: NEGATIVE mg/dL
Leukocytes, UA: NEGATIVE
Nitrite: NEGATIVE
Protein, ur: NEGATIVE mg/dL
Urobilinogen, UA: 0.2 mg/dL (ref 0.0–1.0)

## 2012-10-17 LAB — CBC
HCT: 41.5 % (ref 39.0–52.0)
Hemoglobin: 15.1 g/dL (ref 13.0–17.0)
MCHC: 36.4 g/dL — ABNORMAL HIGH (ref 30.0–36.0)
RDW: 14.7 % (ref 11.5–15.5)
WBC: 14.5 10*3/uL — ABNORMAL HIGH (ref 4.0–10.5)

## 2012-10-17 LAB — CBC WITH DIFFERENTIAL/PLATELET
Basophils Absolute: 0 10*3/uL (ref 0.0–0.1)
Basophils Relative: 0 % (ref 0–1)
Eosinophils Absolute: 0.1 10*3/uL (ref 0.0–0.7)
Eosinophils Relative: 1 % (ref 0–5)
HCT: 45 % (ref 39.0–52.0)
MCHC: 34.9 g/dL (ref 30.0–36.0)
Monocytes Absolute: 0.1 10*3/uL (ref 0.1–1.0)
Neutro Abs: 5.3 10*3/uL (ref 1.7–7.7)
RDW: 14.9 % (ref 11.5–15.5)

## 2012-10-17 LAB — CREATININE, SERUM
Creatinine, Ser: 0.81 mg/dL (ref 0.50–1.35)
GFR calc Af Amer: >90 mL/min (ref >90)
GFR calc non Af Amer: >90 mL/min (ref >90)

## 2012-10-17 LAB — COMPREHENSIVE METABOLIC PANEL
ALT: 39 U/L (ref 0–53)
Alkaline Phosphatase: 90 U/L (ref 39–117)
CO2: 24 mEq/L (ref 19–32)
GFR calc Af Amer: >90 mL/min (ref >90)
GFR calc non Af Amer: >90 mL/min (ref >90)
Glucose, Bld: 134 mg/dL — ABNORMAL HIGH (ref 70–99)
Potassium: 3.1 mEq/L — ABNORMAL LOW (ref 3.5–5.1)
Sodium: 140 mEq/L (ref 135–145)
Total Protein: 7.3 g/dL (ref 6.0–8.3)

## 2012-10-17 LAB — PRO B NATRIURETIC PEPTIDE: Pro B Natriuretic peptide (BNP): 107.6 pg/mL (ref 0–125)

## 2012-10-17 LAB — EXPECTORATED SPUTUM ASSESSMENT W GRAM STAIN, RFLX TO RESP C

## 2012-10-17 LAB — STREP PNEUMONIAE URINARY ANTIGEN: Strep Pneumo Urinary Antigen: NEGATIVE

## 2012-10-17 MED ORDER — PIPERACILLIN-TAZOBACTAM 3.375 G IVPB
3.3750 g | Freq: Three times a day (TID) | INTRAVENOUS | Status: DC
  Administered 2012-10-17 – 2012-10-20 (×8): 3.375 g via INTRAVENOUS
  Filled 2012-10-17 (×10): qty 50

## 2012-10-17 MED ORDER — MORPHINE SULFATE ER 15 MG PO TBCR
30.0000 mg | EXTENDED_RELEASE_TABLET | Freq: Two times a day (BID) | ORAL | Status: DC
  Administered 2012-10-17 – 2012-10-21 (×5): 30 mg via ORAL
  Filled 2012-10-17: qty 1
  Filled 2012-10-17 (×2): qty 2
  Filled 2012-10-17 (×2): qty 1
  Filled 2012-10-17: qty 2

## 2012-10-17 MED ORDER — FOLIC ACID 1 MG PO TABS
1.0000 mg | ORAL_TABLET | Freq: Every day | ORAL | Status: DC
  Administered 2012-10-17 – 2012-10-21 (×5): 1 mg via ORAL
  Filled 2012-10-17 (×5): qty 1

## 2012-10-17 MED ORDER — IPRATROPIUM BROMIDE 0.02 % IN SOLN
0.5000 mg | Freq: Once | RESPIRATORY_TRACT | Status: AC
Start: 2012-10-17 — End: 2012-10-17
  Administered 2012-10-17: 0.5 mg via RESPIRATORY_TRACT
  Filled 2012-10-17: qty 2.5

## 2012-10-17 MED ORDER — SODIUM CHLORIDE 0.9 % IV SOLN: 250.0000 mL | INTRAVENOUS | Status: DC | PRN

## 2012-10-17 MED ORDER — INSULIN ASPART 100 UNIT/ML ~~LOC~~ SOLN
2.0000 [IU] | SUBCUTANEOUS | Status: DC
  Administered 2012-10-17: 6 [IU] via SUBCUTANEOUS
  Administered 2012-10-17 – 2012-10-18 (×3): 4 [IU] via SUBCUTANEOUS
  Administered 2012-10-18 (×2): 6 [IU] via SUBCUTANEOUS
  Administered 2012-10-18: 4 [IU] via SUBCUTANEOUS
  Administered 2012-10-19: 2 [IU] via SUBCUTANEOUS
  Administered 2012-10-19 (×2): 4 [IU] via SUBCUTANEOUS

## 2012-10-17 MED ORDER — FOLIC ACID 400 MCG PO TABS: 800.0000 ug | ORAL_TABLET | Freq: Every day | ORAL | Status: DC

## 2012-10-17 MED ORDER — METHYLPREDNISOLONE SODIUM SUCC 125 MG IJ SOLR
125.0000 mg | Freq: Once | INTRAMUSCULAR | Status: AC
  Administered 2012-10-17: 125 mg via INTRAVENOUS
  Filled 2012-10-17: qty 2

## 2012-10-17 MED ORDER — PREGABALIN 50 MG PO CAPS
150.0000 mg | ORAL_CAPSULE | Freq: Three times a day (TID) | ORAL | Status: DC
  Administered 2012-10-17 – 2012-10-21 (×12): 150 mg via ORAL
  Filled 2012-10-17 (×12): qty 3

## 2012-10-17 MED ORDER — VANCOMYCIN HCL IN DEXTROSE 1-5 GM/200ML-% IV SOLN
INTRAVENOUS | Status: AC
  Administered 2012-10-17: 1000 mg via INTRAVENOUS
  Filled 2012-10-17: qty 200

## 2012-10-17 MED ORDER — FUROSEMIDE 10 MG/ML IJ SOLN
40.0000 mg | Freq: Once | INTRAMUSCULAR | Status: AC
  Administered 2012-10-17: 40 mg via INTRAVENOUS
  Filled 2012-10-17: qty 4

## 2012-10-17 MED ORDER — PIPERACILLIN-TAZOBACTAM 3.375 G IVPB
3.3750 g | Freq: Once | INTRAVENOUS | Status: AC
  Administered 2012-10-17: 3.375 g via INTRAVENOUS
  Filled 2012-10-17: qty 50

## 2012-10-17 MED ORDER — ATORVASTATIN CALCIUM 80 MG PO TABS
80.0000 mg | ORAL_TABLET | Freq: Every day | ORAL | Status: DC
  Administered 2012-10-17 – 2012-10-20 (×3): 80 mg via ORAL
  Filled 2012-10-17 (×5): qty 1

## 2012-10-17 MED ORDER — VANCOMYCIN HCL IN DEXTROSE 1-5 GM/200ML-% IV SOLN
1000.0000 mg | Freq: Two times a day (BID) | INTRAVENOUS | Status: DC
  Administered 2012-10-18: 1000 mg via INTRAVENOUS
  Filled 2012-10-17 (×2): qty 200

## 2012-10-17 MED ORDER — IOHEXOL 350 MG/ML SOLN
125.0000 mL | Freq: Once | INTRAVENOUS | Status: AC | PRN
  Administered 2012-10-17: 125 mL via INTRAVENOUS

## 2012-10-17 MED ORDER — METHYLPREDNISOLONE SODIUM SUCC 125 MG IJ SOLR
60.0000 mg | Freq: Four times a day (QID) | INTRAMUSCULAR | Status: DC
  Administered 2012-10-17 – 2012-10-18 (×3): 60 mg via INTRAVENOUS
  Filled 2012-10-17 (×7): qty 0.96

## 2012-10-17 MED ORDER — SODIUM CHLORIDE 0.9 % IJ SOLN
3.0000 mL | Freq: Two times a day (BID) | INTRAMUSCULAR | Status: DC
  Administered 2012-10-17 – 2012-10-21 (×5): 3 mL via INTRAVENOUS

## 2012-10-17 MED ORDER — ASPIRIN 81 MG PO CHEW
81.0000 mg | CHEWABLE_TABLET | Freq: Every day | ORAL | Status: DC
  Administered 2012-10-17 – 2012-10-21 (×5): 81 mg via ORAL
  Filled 2012-10-17 (×6): qty 1

## 2012-10-17 MED ORDER — PANTOPRAZOLE SODIUM 40 MG IV SOLR
40.0000 mg | Freq: Two times a day (BID) | INTRAVENOUS | Status: DC
  Administered 2012-10-17 – 2012-10-18 (×2): 40 mg via INTRAVENOUS
  Filled 2012-10-17 (×3): qty 40

## 2012-10-17 MED ORDER — VANCOMYCIN HCL IN DEXTROSE 1-5 GM/200ML-% IV SOLN
1000.0000 mg | Freq: Once | INTRAVENOUS | Status: AC
  Filled 2012-10-17: qty 200

## 2012-10-17 MED ORDER — VANCOMYCIN HCL 10 G IV SOLR
1.0000 g | Freq: Once | INTRAVENOUS | Status: DC
  Filled 2012-10-17: qty 1000

## 2012-10-17 MED ORDER — ASPIRIN 81 MG PO TABS: 81.0000 mg | ORAL_TABLET | Freq: Every day | ORAL | Status: DC

## 2012-10-17 MED ORDER — AZITHROMYCIN 500 MG PO TABS
500.0000 mg | ORAL_TABLET | ORAL | Status: DC
  Administered 2012-10-17 – 2012-10-18 (×2): 500 mg via ORAL
  Filled 2012-10-17 (×3): qty 1

## 2012-10-17 MED ORDER — SODIUM CHLORIDE 0.9 % IJ SOLN: 3.0000 mL | INTRAMUSCULAR | Status: DC | PRN

## 2012-10-17 MED ORDER — POTASSIUM CHLORIDE CRYS ER 20 MEQ PO TBCR
40.0000 meq | EXTENDED_RELEASE_TABLET | Freq: Once | ORAL | Status: AC
  Administered 2012-10-17: 40 meq via ORAL
  Filled 2012-10-17: qty 2

## 2012-10-17 MED ORDER — ALBUTEROL SULFATE (5 MG/ML) 0.5% IN NEBU
2.5000 mg | INHALATION_SOLUTION | Freq: Once | RESPIRATORY_TRACT | Status: AC
  Administered 2012-10-17: 2.5 mg via RESPIRATORY_TRACT
  Filled 2012-10-17: qty 0.5

## 2012-10-17 MED ORDER — ALBUTEROL SULFATE (5 MG/ML) 0.5% IN NEBU
2.5000 mg | INHALATION_SOLUTION | Freq: Four times a day (QID) | RESPIRATORY_TRACT | Status: DC
  Administered 2012-10-17 – 2012-10-18 (×5): 2.5 mg via RESPIRATORY_TRACT
  Filled 2012-10-17 (×5): qty 0.5

## 2012-10-17 MED ORDER — SODIUM CHLORIDE 0.9 % IV SOLN
INTRAVENOUS | Status: DC
  Administered 2012-10-17 – 2012-10-18 (×3): via INTRAVENOUS
  Administered 2012-10-19: 500 mL via INTRAVENOUS

## 2012-10-17 MED ORDER — METOPROLOL TARTRATE 25 MG PO TABS
25.0000 mg | ORAL_TABLET | Freq: Two times a day (BID) | ORAL | Status: DC
  Administered 2012-10-17 – 2012-10-21 (×7): 25 mg via ORAL
  Filled 2012-10-17 (×9): qty 1

## 2012-10-17 MED ORDER — VANCOMYCIN HCL 10 G IV SOLR
1500.0000 mg | INTRAVENOUS | Status: AC
  Administered 2012-10-17: 1500 mg via INTRAVENOUS
  Filled 2012-10-17: qty 1500

## 2012-10-17 MED ORDER — IPRATROPIUM BROMIDE 0.02 % IN SOLN
0.5000 mg | Freq: Four times a day (QID) | RESPIRATORY_TRACT | Status: DC
  Administered 2012-10-17 – 2012-10-18 (×5): 0.5 mg via RESPIRATORY_TRACT
  Filled 2012-10-17 (×5): qty 2.5

## 2012-10-17 MED ORDER — HEPARIN SODIUM (PORCINE) 5000 UNIT/ML IJ SOLN
5000.0000 [IU] | Freq: Three times a day (TID) | INTRAMUSCULAR | Status: DC
  Administered 2012-10-17 – 2012-10-19 (×5): 5000 [IU] via SUBCUTANEOUS
  Filled 2012-10-17 (×8): qty 1

## 2012-10-17 NOTE — ED Notes (Signed)
Per EMS:  Pt did not sleep well last night.  Feels more SOB than usual.  Pt on home O2.

## 2012-10-17 NOTE — ED Notes (Signed)
Report given to Fayrene Fearing, RN [facility] Room 2104.

## 2012-10-17 NOTE — ED Notes (Signed)
[facility] at bedside preapring patient for transport

## 2012-10-17 NOTE — Progress Notes (Signed)
ANTIBIOTIC CONSULT NOTE - INITIAL  Pharmacy Consult for vancomycin and zosyn Indication: Bilateral Pneumonia (Aspiration vs. Community Acquired)  No Known Allergies  Patient Measurements: Height: 6\' 1"  (185.4 cm) Weight: 245 lb (111.131 kg) IBW/kg (Calculated) : 79.9   Vital Signs: Temp: 98.5 F (36.9 C) (02/22 1044) Temp src: Oral (02/22 1044) BP: 104/65 mmHg (02/22 1345) Pulse Rate: 122 (02/22 1345) Intake/Output from previous day:   Intake/Output from this shift: Total I/O In: -  Out: 1150 [Urine:1150]  Labs:  Recent Labs  10/17/12 1100  WBC 6.0  HGB 15.7  PLT 195  CREATININE 0.80   Estimated Creatinine Clearance: 128.3 ml/min (by C-G formula based on Cr of 0.8). No results found for this basename: VANCOTROUGH, VANCOPEAK, VANCORANDOM, GENTTROUGH, GENTPEAK, GENTRANDOM, TOBRATROUGH, TOBRAPEAK, TOBRARND, AMIKACINPEAK, AMIKACINTROU, AMIKACIN,  in the last 72 hours   Microbiology: No results found for this or any previous visit (from the past 720 hour(s)).  Medical History: Past Medical History  Diagnosis Date  . Rheumatoid arthritis   . Psoriasis   . Hypertension   . Gastroesophageal reflux disease   . COPD (chronic obstructive pulmonary disease)   . CAD (coronary artery disease)     MI 2007, CABG  . Osteoporosis     per DEXA 05-2008  . Peripheral polyneuropathy     Severe, causing problems with his feet, see surgeries  . Diabetes mellitus     Dx 07-2008 A1C 6.2  . Gastritis   . CHF (congestive heart failure)   . Emphysema     Medications:  Noted patient was on methotrexate prior to admission  Assessment: 62 year old white male with severe chronic obstructive lung disease admitted with bilateral lower lobe pneumonia question of aspiration versus community-acquired. Patient stated he had tremors last night with a temp of 102.5. Patient also noted to be on multiple antibiotics in the past few month for feet wound infections and reports diarrhea for the  past two weeks.  Goal of Therapy:  Vancomycin trough level 15-20 mcg/ml  Plan:  1.Follow up culture results 2.Zosyn 3.375g IV q8 hours - infuse each dose over 4 hours 3.Vancomycin 1500mg  IV now - to equal 2500mg  load then continue with 1000mg  q12 4.Vancomycin trough at steady state  Severiano Gilbert 10/17/2012,5:11 PM

## 2012-10-17 NOTE — H&P (Signed)
PULMONARY  / CRITICAL CARE MEDICINE  Name: Joseph [name] MRN: [id number] DOB: June 15, 1951    ADMISSION DATE:  10/17/2012   CHIEF COMPLAINT:  Dyspnea and hypoxemia  BRIEF PATIENT DESCRIPTION: 62 year old white male with severe chronic obstructive lung disease admitted with bilateral lower lobe pneumonia question of aspiration versus community-acquired.   The patient noted the subacute onset of dyspnea cough and congestion over the last 24 hours. The patient has a baseline need of oxygen 3 L. Patient has severe COPD and is on chronic inhaled Advair and Spiriva at home. Patient was brought to the emergency room and transported to the ICU for further inpatient care on 10/17/2012.  SIGNIFICANT EVENTS / STUDIES:  10/17/12 CT scan of chest: Revealed no pulmonary embolism by basilar infiltrates and severe bullous emphysema  LINES / TUBES: PIV  CULTURES: Blood cultures x2  10/17/12 Respiratory culture 10/17/12   ANTIBIOTICS: Zosyn 10/17/2012 Vancomycin 10/17/2012  HISTORY OF PRESENT ILLNESS:  This is a 62 year old white male with advanced bullous emphysema on home oxygen therapy and Advair and Spiriva. The patient also has severe rheumatoid arthritis and is on chronic prednisone and methotrexate. Patient brought to the emergency room after 24 hours of increased dyspnea and cough. The patient denies a fever chills or sweats. There is no chest pain. Cough is minimally productive. There is no hemoptysis. The patient was found to be severely hypoxic in the emergency room placed on a nonrebreather mask. Patient was admitted to the ICU. Note the BNP is only 100.  The patient denies any change in weight. There is no real chest pain. Patient notes ongoing reflux symptoms on a nightly basis.  PAST MEDICAL HISTORY :  Past Medical History  Diagnosis Date  . Rheumatoid arthritis   . Psoriasis   . Hypertension   . Gastroesophageal reflux disease   . COPD (chronic obstructive pulmonary disease)   . CAD  (coronary artery disease)     MI 2007, CABG  . Osteoporosis     per DEXA 05-2008  . Peripheral polyneuropathy     Severe, causing problems with his feet, see surgeries  . Diabetes mellitus     Dx 07-2008 A1C 6.2  . Gastritis   . CHF (congestive heart failure)   . Emphysema    Past Surgical History  Procedure Laterality Date  . Foot surgery      to tendon release and repair of osteomyelitis   . Cholecystectomy    . Coronary artery bypass graft  2007    (LIMA to the LAD, left radial to obtuse marginal, SVG to first diagonal, SVG to PDA. His ejection fraction to 50-55%  . Achiles tendon surgery  8/09  . Toe amputation  4-12    d/t a deformity, found to have osteomyelitis, complicated by post-op foot strtess FX   Prior to Admission medications   Medication Sig Start Date End Date Taking? Authorizing Provider  albuterol (PROVENTIL,VENTOLIN) 90 MCG/ACT inhaler Inhale 2 puffs into the lungs every 4 (four) hours as needed for wheezing or shortness of breath. 04/09/11  Yes Waymon Budge, MD  alendronate (FOSAMAX) 70 MG tablet Take 1 tablet (70 mg total) by mouth every 7 (seven) days. Take with a full glass of water on an empty stomach. 03/12/11  Yes Wanda Plump, MD  atorvastatin (LIPITOR) 80 MG tablet Take 1 tablet (80 mg total) by mouth daily. 02/18/12  Yes Rollene Rotunda, MD  Fluticasone-Salmeterol (ADVAIR DISKUS) 100-50 MCG/DOSE AEPB 1 puff then rinse mouth,  twice daily 03/16/12 03/16/13 Yes Waymon Budge, MD  Lancets [facility] ULTRASOFT) lancets Use as instructed 12/23/11 12/22/12 Yes Wanda Plump, MD  metoprolol tartrate (LOPRESSOR) 25 MG tablet Take 1 tablet (25 mg total) by mouth 2 (two) times daily. 02/18/12  Yes Rollene Rotunda, MD  nitroGLYCERIN (NITROSTAT) 0.4 MG SL tablet Place 1 tablet (0.4 mg total) under the tongue every 5 (five) minutes as needed for chest pain. 02/18/12  Yes Rollene Rotunda, MD  NON FORMULARY oxygen 3L per [place] 24 hours a day   Yes Historical Provider, MD  ONE TOUCH  ULTRA TEST test strip USE AS DIRECTED 04/12/12  Yes Wanda Plump, MD  potassium chloride SA (K-DUR,KLOR-CON) 20 MEQ tablet Take 1 tablet (20 mEq total) by mouth 2 (two) times daily. 2 by mouth in the morning and 1 by mouth at 4 pm 02/18/12  Yes Rollene Rotunda, MD  Respiratory Therapy Supplies (FLUTTER) DEVI Blow through several times, 2 or 3 sets per day as needed 09/16/11  Yes Waymon Budge, MD  tiotropium (SPIRIVA HANDIHALER) 18 MCG inhalation capsule Place 1 capsule (18 mcg total) into inhaler and inhale daily. 1 puff in AM 03/16/12 03/16/13 Yes Waymon Budge, MD  UNABLE TO FIND Med Name: Incentive Spirometer Use as directed 08/01/11  Yes Waymon Budge, MD  amitriptyline (ELAVIL) 75 MG tablet Take 100 mg by mouth 3 (three) times daily.     Historical Provider, MD  Ascorbic Acid (VITAMIN C) 500 MG tablet Take 500 mg by mouth daily.      Historical Provider, MD  aspirin 81 MG tablet Take 81 mg by mouth daily.      Historical Provider, MD  calamine ([facility] CALAMINE) lotion      Historical Provider, MD  Carbonyl Iron (PERFECT IRON PO) Take by mouth. 1 tab daily     Historical Provider, MD  folic acid (FOLVITE) 400 MCG tablet Take 800 mcg by mouth daily.     Historical Provider, MD  furosemide (LASIX) 20 MG tablet Take 60 mg by mouth daily. 2 in the morning and 1 in the afternoon 02/18/12   Rollene Rotunda, MD  methotrexate (RHEUMATREX) 2.5 MG tablet Take 8 tablets every week     Historical Provider, MD  morphine (MS CONTIN) 30 MG 12 hr tablet Take 30 mg by mouth 3 (three) times daily.  12/12/11   Historical Provider, MD  omeprazole (PRILOSEC) 20 MG capsule  05/13/12   Wanda Plump, MD  oxyCODONE-acetaminophen (PERCOCET) 7.5-325 MG per tablet Take 1 tablet by mouth. 3 times a day    Historical Provider, MD  predniSONE (DELTASONE) 10 MG tablet pack TAKES 6 MG AM .    Historical Provider, MD  pregabalin (LYRICA) 150 MG capsule Take 150 mg by mouth 3 (three) times daily.      Historical Provider, MD  vitamin B-12  (CYANOCOBALAMIN) 250 MCG tablet Take 250 mcg by mouth daily.      Historical Provider, MD   No Known Allergies  FAMILY HISTORY:  Family History  Problem Relation Age of Onset  . Heart disease Mother   . Heart disease Father   . Heart attack Father     x2  . Colon cancer Neg Hx   . Prostate cancer Neg Hx    SOCIAL HISTORY:  reports that he quit smoking about 7 years ago. He quit smokeless tobacco use about 6 years ago. He reports that he does not drink alcohol. His drug history is not  on file.  REVIEW OF SYSTEMS:   Constitutional:   No  weight loss, night sweats,  Fevers, chills,+++ fatigue,+++ lassitude. HEENT:   No headaches,  Difficulty swallowing,  Tooth/dental problems,  Sore throat,                No sneezing, itching, ear ache, nasal congestion, post nasal drip,   CV:  No chest pain,  Orthopnea, PND, swelling in lower extremities, anasarca, dizziness, palpitations  GI +++ heartburn, +++indigestion, No abdominal pain, nausea, vomiting, diarrhea, change in bowel habits, loss of appetite  Resp: +++ shortness of breath with exertion and at rest.  No excess mucus, no productive cough,  +++ non-productive cough,  No coughing up of blood.  No change in color of mucus.  No wheezing.  No chest wall deformity  Skin: no rash or lesions.  GU: no dysuria, change in color of urine, no urgency or frequency.  No flank pain.  MS:  +++ joint pain no  swelling.  Notes decreased range of motion.  No back pain.  Psych:  No change in mood or affect. No depression or anxiety.  No memory loss.   SUBJECTIVE:  The patient currently is in no acute distress in the ICU VITAL SIGNS: Temp:  [98.5 F (36.9 C)] 98.5 F (36.9 C) (02/22 1044) Pulse Rate:  [122-134] 122 (02/22 1345) Resp:  [16-30] 24 (02/22 1345) BP: (90-126)/(53-73) 104/65 mmHg (02/22 1345) SpO2:  [86 %-99 %] 96 % (02/22 1345) Weight:  [111.131 kg (245 lb)] 111.131 kg (245 lb) (02/22 1044) HEMODYNAMICS: Hemodynamically stable    VENTILATOR SETTINGS: Not on mechanical ventilation but is on a partial rebreather mask   INTAKE / OUTPUT: Intake/Output     02/21 0701 - 02/22 0700 02/22 0701 - 02/23 0700   Urine (mL/kg/hr)  1150   Total Output   1150   Net   -1150          PHYSICAL EXAMINATION: General:  No acute distress Neuro:  No neurologic deficits HEENT:  Dry mucous membranes no lesions no thyromegaly no jugular venous distention Cardiovascular:  Regular rate and rhythm normal S1-S2 no murmur or gallop Lungs:  Mild basilar dry rales in expired wheezes distant breath sounds Abdomen:  Soft nontender no organomegaly Musculoskeletal:  Chronic rheumatoid arthritis changes of both upper and lower extremities Skin:  Clear  LABS:  Recent Labs Lab 10/17/12 1100 10/17/12 1123  HGB 15.7  --   WBC 6.0  --   PLT 195  --   NA 140  --   K 3.1*  --   CL 102  --   CO2 24  --   GLUCOSE 134*  --   BUN 23  --   CREATININE 0.80  --   CALCIUM 9.6  --   AST 28  --   ALT 39  --   ALKPHOS 90  --   BILITOT 1.0  --   PROT 7.3  --   ALBUMIN 3.6  --   LATICACIDVEN 3.5*  --   TROPONINI <0.30  --   PROBNP 107.6  --   PHART  --  7.457*  PCO2ART  --  29.0*  PO2ART  --  43.0*   No results found for this basename: GLUCAP,  in the last 168 hours  CXR: Chest x-ray and CT scan the chest both showed by basilar infiltrates and severe bullous emphysema with scarring  ASSESSMENT / PLAN:  PULMONARY A: Acute on chronic hypoxic respiratory failure  with by basilar infiltrates compatible with pneumonia rule out aspiration with high level reflux versus community-acquired pneumonia the patient is severely immunosuppressed and needs to be covered broadly P:   Intensive neb treatments Oxygen titration IV Medrol Antibiotics as per infectious disease section May yet need intubation  CARDIOVASCULAR A: No acute issues, R. history of coronary artery disease with bypass surgery 2006 P:  Monitor Heparin subcutaneous for DVT  prophylaxis  RENAL A:  No acute renal issues P:   Monitor  GASTROINTESTINAL A:  History of high level reflux disease P:   IV proton pump inhibitor twice daily  HEMATOLOGIC A:  No acute hematologic issues P:  Monitor  INFECTIOUS A:  Bibasilar pneumonia rule out aspiration versus community acquired but the patient is immunosuppressed P:   IV Zosyn, vancomycin and oral azithromycin will be administered Follow cultures when available  ENDOCRINE A:  History of diabetes and likely had be hyperglycemic with steroids  P:   ICU hyperglycemia protocol  NEUROLOGIC A:  No acute issues P:   Monitor  TODAY'S SUMMARY: 62 year old white male with rheumatoid arthritis and severe bullous emphysema now with by basilar infiltrates compatible with either aspiration pneumonia or kidney car pneumonia with immunocompromise state. Plan will be to admit to ICU, administer oxygen and respiratory therapy, broad spectrum IV  antibiotics, and steroids.  This patient is at high risk for intubation  I have personally obtained a history, examined the patient, evaluated laboratory and imaging results, formulated the assessment and plan and placed orders. CRITICAL CARE: The patient is critically ill with multiple organ systems failure and requires high complexity decision making for assessment and support, frequent evaluation and titration of therapies, application of advanced monitoring technologies and extensive interpretation of multiple databases. Critical Care Time devoted to patient care services described in this note is 45  minutes.   Dorcas Carrow Beeper  [phone]  Cell  [phone]  If no response or cell goes to voicemail, call beeper [phone]  Pulmonary and Critical Care Medicine [facility]   10/17/2012, 4:53 PM

## 2012-10-17 NOTE — ED Provider Notes (Signed)
I saw and evaluated the patient, reviewed the resident's note and I agree with the findings and plan.  The patient presents here with the acute onset of shortness of breath that started this morning.  He felt at his normal yesterday and when he woke this am.  He denies to me any cough, fever, or chest pain.  No leg pain or swelling.  He does have a pmh significant for CAD with CABG, COPD, and RA.  He is on home oxygen at [facility].  On exam, the patient is afebrile and vitals reveal a tachycardia and hypoxia.  He is in moderate respiratory distress and is unable to speak in full sentences without running out of breath.  The heart is tachycardia without murmurs, rubs.  Lungs are noted to have rales in the bases and rhonchi that are scattered throughout.  The abdomen is benign.  There is no edema.  The patient arrived to the ED in respiratory distress.  He is requiring a non-rebreather to maintain his saturations in the 90's.  Initial blood gas on 4 LNC revealed hypoxia but no hypercapnia.  Workup was initiated revealing a normal bnp and ekg and negative troponin.  There was no evidence of pneumonia or heart failure on the chest xray.  He was given nebulizer treatment, steroids, and iv lasix followed by antibiotics to cover for chf, copd, and possible pneumonia.  He was also sent for a ct of the chest to rule out pe.  This was negative for pe, however showed diffuse parenchymal lung disease consistent with severe copd and possible chronic aspiration pnemonitis.    I discussed the patient's care with Dr. Delford Field from [facility] who suspects the patient will require intubation at some point.  Presently, he appears comfortable on supplemental oxygen and will forego intubation until an if this is absolutely necessary.  Transfer arrangements made, EMS here to transfer to [facility].  CRITICAL CARE Performed by: Geoffery Lyons   Total critical care time: 45 minutes  Critical care time was exclusive of separately billable  procedures and treating other patients.  Critical care was necessary to treat or prevent imminent or life-threatening deterioration.  Critical care was time spent personally by me on the following activities: development of treatment plan with patient and/or surrogate as well as nursing, discussions with consultants, evaluation of patient's response to treatment, examination of patient, obtaining history from patient or surrogate, ordering and performing treatments and interventions, ordering and review of laboratory studies, ordering and review of radiographic studies, pulse oximetry and re-evaluation of patient's condition.   Geoffery Lyons, MD 10/17/12 [phone]

## 2012-10-17 NOTE — ED Provider Notes (Signed)
History     CSN: [id number]  Arrival date & time 10/17/12  1040   First MD Initiated Contact with Patient 10/17/12 1133      Chief Complaint  Patient presents with  . Shortness of Breath    (Consider location/radiation/quality/duration/timing/severity/associated sxs/prior treatment) HPI Comments: 62 y.o PMH COPD on 3 L O2 (follows with Dr. Maple Hudson), RA on Prednisone, CAD s/p CABG, ASVD, MI, psoriasis, DM, peripheral neuropathy, multiple ulcers feet followed by Dr. Samuel Bouche for right third toe wound.   He presents for sob.  He had trouble sleeping last night due to sob.  He experience sob this am around 8:30 am as well.  He tried Albuterol and Spiriva with min. Relief.  He was brought in by EMS.  Wife states she noticed that he was "gurgly" sounding last night.  She also stated he had tremors and T max 102.5.  Denies recent hospital admission in the last 3 months.  He also has been on multiple antibiotics in the past few months for feet wound infections and he reports diarrhea x 2 weeks.    PsuH: cholecystectomy  SH: married; sedentary lifestyle   Patient is a 62 y.o. male presenting with shortness of breath. The history is provided by the patient and the spouse. No language interpreter was used.  Shortness of Breath Severity:  Moderate Onset quality:  Gradual Progression:  Worsening Chronicity:  Chronic Relieved by:  Nothing (tried Albuterol inhaler and Spiriva ) Worsened by:  Nothing tried Associated symptoms: cough and fever   Associated symptoms: no abdominal pain and no chest pain   Risk factors: obesity   Risk factors comment:  Sedentary lifestyle   Past Medical History  Diagnosis Date  . Rheumatoid arthritis   . Psoriasis   . Hypertension   . Gastroesophageal reflux disease   . COPD (chronic obstructive pulmonary disease)   . CAD (coronary artery disease)     MI 2007, CABG  . Osteoporosis     per DEXA 05-2008  . Peripheral polyneuropathy     Severe, causing problems  with his feet, see surgeries  . Diabetes mellitus     Dx 07-2008 A1C 6.2  . Gastritis   . CHF (congestive heart failure)   . Emphysema     Past Surgical History  Procedure Laterality Date  . Foot surgery      to tendon release and repair of osteomyelitis   . Cholecystectomy    . Coronary artery bypass graft  2007    (LIMA to the LAD, left radial to obtuse marginal, SVG to first diagonal, SVG to PDA. His ejection fraction to 50-55%  . Achiles tendon surgery  8/09  . Toe amputation  4-12    d/t a deformity, found to have osteomyelitis, complicated by post-op foot strtess FX    Family History  Problem Relation Age of Onset  . Heart disease Mother   . Heart disease Father   . Heart attack Father     x2  . Colon cancer Neg Hx   . Prostate cancer Neg Hx     History  Substance Use Topics  . Smoking status: Former Smoker -- 1.00 packs/day for 40 years    Quit date: 08/26/2005  . Smokeless tobacco: Former Neurosurgeon    Quit date: 04/02/2006  . Alcohol Use: No      Review of Systems  Constitutional: Positive for fever.  Respiratory: Positive for cough and shortness of breath.   Cardiovascular: Negative for chest pain.  Gastrointestinal: Negative for abdominal pain.  Skin: Positive for wound.  Allergic/Immunologic: Positive for immunocompromised state.  All other systems reviewed and are negative.    Allergies  Review of patient's allergies indicates no known allergies.  Home Medications   Current Outpatient Rx  Name  Route  Sig  Dispense  Refill  . albuterol (PROVENTIL,VENTOLIN) 90 MCG/ACT inhaler   Inhalation   Inhale 2 puffs into the lungs every 4 (four) hours as needed for wheezing or shortness of breath.   17 g   prn   . alendronate (FOSAMAX) 70 MG tablet   Oral   Take 1 tablet (70 mg total) by mouth every 7 (seven) days. Take with a full glass of water on an empty stomach.   4 tablet   3   . atorvastatin (LIPITOR) 80 MG tablet   Oral   Take 1 tablet (80  mg total) by mouth daily.   30 tablet   11   . Fluticasone-Salmeterol (ADVAIR DISKUS) 100-50 MCG/DOSE AEPB      1 puff then rinse mouth, twice daily   60 each   prn   . Lancets (ONETOUCH ULTRASOFT) lancets      Use as instructed   100 each   6   . metoprolol tartrate (LOPRESSOR) 25 MG tablet   Oral   Take 1 tablet (25 mg total) by mouth 2 (two) times daily.   60 tablet   11   . nitroGLYCERIN (NITROSTAT) 0.4 MG SL tablet   Sublingual   Place 1 tablet (0.4 mg total) under the tongue every 5 (five) minutes as needed for chest pain.   25 tablet   11   . NON FORMULARY      oxygen 3L per [place] 24 hours a day         . ONE TOUCH ULTRA TEST test strip      USE AS DIRECTED   50 each   0     Check once daily.   . potassium chloride SA (K-DUR,KLOR-CON) 20 MEQ tablet   Oral   Take 1 tablet (20 mEq total) by mouth 2 (two) times daily. 2 by mouth in the morning and 1 by mouth at 4 pm   90 tablet   11   . Respiratory Therapy Supplies (FLUTTER) DEVI      Blow through several times, 2 or 3 sets per day as needed   1 each   0   . tiotropium (SPIRIVA HANDIHALER) 18 MCG inhalation capsule   Inhalation   Place 1 capsule (18 mcg total) into inhaler and inhale daily. 1 puff in AM   30 capsule   prn   . UNABLE TO FIND      Med Name: Incentive Spirometer Use as directed   1 Device   0   . amitriptyline (ELAVIL) 75 MG tablet   Oral   Take 100 mg by mouth 3 (three) times daily.          . Ascorbic Acid (VITAMIN C) 500 MG tablet   Oral   Take 500 mg by mouth daily.           Marland Kitchen aspirin 81 MG tablet   Oral   Take 81 mg by mouth daily.           . calamine ([employer] CALAMINE) lotion                . Carbonyl Iron (PERFECT IRON PO)   Oral  Take by mouth. 1 tab daily          . folic acid (FOLVITE) 400 MCG tablet   Oral   Take 800 mcg by mouth daily.          . furosemide (LASIX) 20 MG tablet   Oral   Take 60 mg by mouth daily. 2 in the morning and 1  in the afternoon         . methotrexate (RHEUMATREX) 2.5 MG tablet      Take 8 tablets every week          . morphine (MS CONTIN) 30 MG 12 hr tablet   Oral   Take 30 mg by mouth 3 (three) times daily.          Marland Kitchen omeprazole (PRILOSEC) 20 MG capsule               . oxyCODONE-acetaminophen (PERCOCET) 7.5-325 MG per tablet   Oral   Take 1 tablet by mouth. 3 times a day         . predniSONE (DELTASONE) 10 MG tablet pack      TAKES 6 MG AM .         . pregabalin (LYRICA) 150 MG capsule   Oral   Take 150 mg by mouth 3 (three) times daily.           . vitamin B-12 (CYANOCOBALAMIN) 250 MCG tablet   Oral   Take 250 mcg by mouth daily.             BP 104/65  Pulse 122  Temp(Src) 98.5 F (36.9 C) (Oral)  Resp 24  Ht 6\' 1"  (1.854 m)  Wt 245 lb (111.131 kg)  BMI 32.33 kg/m2  SpO2 96%  Physical Exam  Nursing note and vitals reviewed. Constitutional: He is oriented to person, place, and time. He appears well-developed and well-nourished. He is cooperative.  HENT:  Head: Normocephalic and atraumatic.  Mouth/Throat: Oropharynx is clear and moist and mucous membranes are normal. No oropharyngeal exudate.  Eyes: Conjunctivae are normal. Pupils are equal, round, and reactive to light. Right eye exhibits no discharge. Left eye exhibits no discharge. No scleral icterus.  Cardiovascular: Regular rhythm and normal heart sounds.  Tachycardia present.   No murmur heard. Pulmonary/Chest: Effort normal. He has wheezes.  Abdominal: Soft. Bowel sounds are normal. He exhibits no distension. There is no tenderness.  Obese ab  Musculoskeletal: He exhibits no edema.  Neurological: He is alert and oriented to person, place, and time.  Skin: Skin is warm, dry and intact. No rash noted.  Psychiatric: He has a normal mood and affect. His speech is normal and behavior is normal. Judgment and thought content normal. Cognition and memory are normal.    ED Course  Procedures  (including critical care time)  Labs Reviewed  COMPREHENSIVE METABOLIC PANEL - Abnormal; Notable for the following:    Potassium 3.1 (*)    Glucose, Bld 134 (*)    All other components within normal limits  CBC WITH DIFFERENTIAL - Abnormal; Notable for the following:    Neutrophils Relative 89 (*)    Lymphocytes Relative 9 (*)    Lymphs Abs 0.5 (*)    Monocytes Relative 1 (*)    All other components within normal limits  LACTIC ACID, PLASMA - Abnormal; Notable for the following:    Lactic Acid, Venous 3.5 (*)    All other components within normal limits  POCT I-STAT 3, BLOOD GAS (G3+) -  Abnormal; Notable for the following:    pH, Arterial 7.457 (*)    pCO2 arterial 29.0 (*)    pO2, Arterial 43.0 (*)    All other components within normal limits  CULTURE, BLOOD (ROUTINE X 2)  CULTURE, BLOOD (ROUTINE X 2)  URINALYSIS, ROUTINE W REFLEX MICROSCOPIC  PRO B NATRIURETIC PEPTIDE  TROPONIN I  BLOOD GAS, ARTERIAL   Ct Angio Chest Pe W/cm &/or Wo Cm  10/17/2012  *RADIOLOGY REPORT*  Clinical Data: Shortness of breath.  Diabetic.  Hypertension. Coronary artery disease.  CT ANGIOGRAPHY CHEST  Technique:  Multidetector CT imaging of the chest using the standard protocol during bolus administration of intravenous contrast. Multiplanar reconstructed images including MIPs were obtained and reviewed to evaluate the vascular anatomy.  Contrast: OMNIPAQUE IOHEXOL 350 MG/ML SOLN  Comparison: Plain film earlier today.  CT of 02/24/2008.  Findings: Lung windows demonstrate redemonstration of severe centrilobular emphysema with bullous disease in the apices.  Stable subpleural nodularity at the right middle lobe on image 73/series 6. Minimal exclusion of the right lung base.  1.0 cm left upper lobe lung nodule on image 31/series 6, new since the prior exam.  Right lower lobe lung nodule which measures 5 mm on image 76/series 6 and was present back into 1009, measuring 4 mm (difference likely due to slice  selection).  Progression of interstitial lung disease at the bases, including since the abdominal CT of 10/11/2009.  This is a combination of traction bronchiectasis with patchy areas of ground-glass opacity.  Soft tissue windows:  The quality of this exam for evaluation of pulmonary embolism is moderate.  The primary limitation is suboptimal bolus timing, with the bolus centered in the pulmonary veins.  No embolism to the segmental level.  Lung bases are relatively well evaluated, without embolism identified.  Normal aortic caliber without dissection.  Normal heart size, without pericardial effusion.  Small middle mediastinal nodes.  An azygo-esophageal recess node measures 1.3 cm, increased from 1.1 cm on the prior.  Prominent infrahilar nodal tissue bilaterally.  1.0 cm node on image 54/series 4.  Small hiatal hernia.  Increase in the dilated, fluid-filled esophagus, including on image 60/series 4. Prior median sternotomy.  Limited abdominal imaging demonstrates probable hepatic steatosis. No acute osseous abnormality.  IMPRESSION:  1.  No evidence of pulmonary embolism.  Moderate quality exam, with suboptimal bolus timing. 2.  Worsening pulmonary aeration.  Severe centrilobular and paraseptal emphysema with progressive interstitial lung disease at the bases.  Favor chronic aspiration.  Nonspecific interstitial pneumonitis or post infectious fibrosis could look similar. 3.  Dilated, fluid-filled esophagus suggesting dysmotility or gastroesophageal reflux.  This could predispose the patient to chronic aspiration. 4.  Mild thoracic adenopathy, favored to be reactive.  Consider follow-up with chest CT at 3 months to confirm stability or resolution. 5.  1.0 cm left upper lobe lung nodule.  Can exclude primary bronchogenic carcinoma.  Consider further characterization with PET.  These results will be called to the ordering clinician or representative by the Radiologist Assistant, and communication documented in the  PACS Dashboard.   Original Report Authenticated By: Jeronimo Greaves, M.D.    Dg Chest Port 1 View  10/17/2012  *RADIOLOGY REPORT*  Clinical Data: Shortness of breath.  Coronary artery disease and COPD.  PORTABLE CHEST - 1 VIEW  Comparison: 09/14/2012  Findings: Mildly degraded exam due to AP portable technique and patient body habitus.  Patient rotated to the right. Cardiomegaly accentuated by AP portable technique.  No definite  pleural fluid. No pneumothorax. COPD with focal bullous disease in the left upper lobe.  Chronic interstitial thickening.  No convincing evidence of superimposed congestive failure. No lobar consolidation.  IMPRESSION: Cardiomegaly with low lung volumes and chronic interstitial thickening/COPD.  No convincing evidence of acute superimposed process.   Original Report Authenticated By: Jeronimo Greaves, M.D.      1. Acute respiratory failure with hypoxia   2. Sinus tachycardia   3. Acute-on-chronic respiratory failure      Date: 10/17/2012  Rate: 136  Rhythm: sinus tachycardia  QRS Axis: normal  Intervals: normal  ST/T Wave abnormalities: normal  Conduction Disutrbances:none, PVCs   Narrative Interpretation:   Old EKG Reviewed: unchanged    MDM  Acute on chronic hypoxic respiratory failure (with h/o COPD on 3L O2) Sinus tachycardia  CXR, ABG, Atovent/Albuterol, solumedrol CT chest w/u for PE negative. Rx Vanc and Zosyn x 1 for possible chronic aspiration Pulmonologist Dr. Maple Hudson  Full code  Transfer to [facility] for further w/u with CCM and admission   [facility] MD [phone]        Annett Gula, MD 10/17/12 1406  Annett Gula, MD 10/17/12 [phone]

## 2012-10-17 NOTE — ED Notes (Signed)
Amy, Secretary states that RN assignment has not been made as of yet, unit to call back for report when they are ready

## 2012-10-18 ENCOUNTER — Inpatient Hospital Stay (HOSPITAL_COMMUNITY): Payer: BC Managed Care – PPO

## 2012-10-18 DIAGNOSIS — R9439 Abnormal result of other cardiovascular function study: Secondary | ICD-10-CM

## 2012-10-18 DIAGNOSIS — J9620 Acute and chronic respiratory failure, unspecified whether with hypoxia or hypercapnia: Secondary | ICD-10-CM

## 2012-10-18 DIAGNOSIS — J9600 Acute respiratory failure, unspecified whether with hypoxia or hypercapnia: Secondary | ICD-10-CM

## 2012-10-18 LAB — CBC WITH DIFFERENTIAL/PLATELET
Basophils Relative: 0 % (ref 0–1)
Hemoglobin: 13.4 g/dL (ref 13.0–17.0)
Lymphocytes Relative: 2 % — ABNORMAL LOW (ref 12–46)
MCHC: 35.4 g/dL (ref 30.0–36.0)
Monocytes Relative: 2 % — ABNORMAL LOW (ref 3–12)
Neutro Abs: 20.6 10*3/uL — ABNORMAL HIGH (ref 1.7–7.7)
Neutrophils Relative %: 96 % — ABNORMAL HIGH (ref 43–77)
RBC: 4.18 MIL/uL — ABNORMAL LOW (ref 4.22–5.81)
WBC: 21.4 10*3/uL — ABNORMAL HIGH (ref 4.0–10.5)

## 2012-10-18 LAB — PROCALCITONIN: Procalcitonin: 19.16 ng/mL

## 2012-10-18 LAB — COMPREHENSIVE METABOLIC PANEL
BUN: 19 mg/dL (ref 6–23)
CO2: 22 mEq/L (ref 19–32)
Calcium: 8.9 mg/dL (ref 8.4–10.5)
Creatinine, Ser: 0.61 mg/dL (ref 0.50–1.35)
GFR calc Af Amer: >90 mL/min (ref >90)
GFR calc non Af Amer: >90 mL/min (ref >90)
Glucose, Bld: 192 mg/dL — ABNORMAL HIGH (ref 70–99)
Total Protein: 6.3 g/dL (ref 6.0–8.3)

## 2012-10-18 LAB — LEGIONELLA ANTIGEN, URINE

## 2012-10-18 LAB — GLUCOSE, CAPILLARY
Glucose-Capillary: 175 mg/dL — ABNORMAL HIGH (ref 70–99)
Glucose-Capillary: 186 mg/dL — ABNORMAL HIGH (ref 70–99)
Glucose-Capillary: 214 mg/dL — ABNORMAL HIGH (ref 70–99)

## 2012-10-18 MED ORDER — PANTOPRAZOLE SODIUM 40 MG PO TBEC
40.0000 mg | DELAYED_RELEASE_TABLET | Freq: Two times a day (BID) | ORAL | Status: DC
  Administered 2012-10-18 – 2012-10-21 (×6): 40 mg via ORAL
  Filled 2012-10-18 (×6): qty 1

## 2012-10-18 MED ORDER — METHYLPREDNISOLONE SODIUM SUCC 40 MG IJ SOLR
40.0000 mg | Freq: Two times a day (BID) | INTRAMUSCULAR | Status: DC
  Administered 2012-10-19 – 2012-10-20 (×3): 40 mg via INTRAVENOUS
  Filled 2012-10-18 (×6): qty 1

## 2012-10-18 MED ORDER — POTASSIUM CHLORIDE CRYS ER 20 MEQ PO TBCR
20.0000 meq | EXTENDED_RELEASE_TABLET | Freq: Once | ORAL | Status: AC
  Administered 2012-10-18: 20 meq via ORAL
  Filled 2012-10-18: qty 1

## 2012-10-18 NOTE — Progress Notes (Signed)
Pt is on 5L nasal cannula, water bottled added to keep from drying out pt nose.

## 2012-10-18 NOTE — Progress Notes (Signed)
[facility] ADULT ICU REPLACEMENT PROTOCOL FOR AM LAB REPLACEMENT ONLY  The patient does not apply for the [facility] Adult ICU Electrolyte Replacment Protocol based on the criteria listed below:   1. Is GFR >/= 40 ml/min? yes  Patient's GFR today is >90 2. Is urine output >/= 0.5 ml/kg/hr for the last 6 hours? no Patient's UOP is none recorded this shift ml/kg/hr 3. Is BUN < 60 mg/dL? yes  Patient's BUN today is 19 4. Abnormal electrolyte(s):K+3.4 5. Ordered repletion with: NONE 6. If a panic level lab has been reported, has the CCM MD in charge been notified? no.   Physician:  Dr Dirk Dress 10/18/2012 6:34 AM

## 2012-10-18 NOTE — Progress Notes (Signed)
K 3.4   K replaced

## 2012-10-18 NOTE — H&P (Signed)
PULMONARY  / CRITICAL CARE MEDICINE  Name: Joseph [name] MRN: [id number] DOB: 01/27/1951    ADMISSION DATE:  10/17/2012   CHIEF COMPLAINT:  Dyspnea and hypoxemia  BRIEF PATIENT DESCRIPTION: 62 year old white male with severe chronic obstructive lung disease admitted with bilateral lower lobe pneumonia question of aspiration versus community-acquired.   The patient noted the subacute onset of dyspnea cough and congestion over the last 24 hours. The patient has a baseline need of oxygen 3 L. Patient has severe COPD and is on chronic inhaled Advair and Spiriva at home. Patient was brought to the emergency room and transported to the ICU for further inpatient care on 10/17/2012.  SIGNIFICANT EVENTS / STUDIES:  10/17/12 CT scan of chest: Revealed no pulmonary embolism by basilar infiltrates and severe bullous emphysema 2/23- [place] O2 , no distress  LINES / TUBES: PIV  CULTURES: Blood cultures x2  10/17/12 Respiratory culture 10/17/12  ANTIBIOTICS: Zosyn 10/17/2012 Vancomycin 10/17/2012  SUBJECTIVE:  The patient currently is in no acute distress  VITAL SIGNS: Temp:  [98.1 F (36.7 C)-99.1 F (37.3 C)] 98.1 F (36.7 C) (02/23 0804) Pulse Rate:  [55-134] 87 (02/23 0800) Resp:  [14-30] 18 (02/23 0800) BP: (90-126)/(53-77) 102/58 mmHg (02/23 0800) SpO2:  [86 %-99 %] 94 % (02/23 0831) Weight:  [111.131 kg (245 lb)-113.7 kg (250 lb 10.6 oz)] 113.7 kg (250 lb 10.6 oz) (02/23 0600) HEMODYNAMICS: Hemodynamically stable   VENTILATOR SETTINGS: Not on mechanical ventilation but is on a partial rebreather mask   INTAKE / OUTPUT: Intake/Output     02/22 0701 - 02/23 0700 02/23 0701 - 02/24 0700   P.O. 600    I.V. (mL/kg) 653 (5.7) 50 (0.4)   IV Piggyback 775    Total Intake(mL/kg) 2028 (17.8) 50 (0.4)   Urine (mL/kg/hr) 1950    Total Output 1950     Net +78 +50        Stool Occurrence 1 x      PHYSICAL EXAMINATION: General:  No acute distress Neuro:  No neurologic deficits HEENT:   Dry mucous membranes Cardiovascular:  Regular rate and rhythm normal S1-S2 no murmur or gallop Lungs:  CTA , reduced bases Abdomen:  Soft nontender no organomegaly Musculoskeletal:  Chronic rheumatoid arthritis changes of both upper and lower extremities Skin:  Clear  LABS:  Recent Labs Lab 10/17/12 1100 10/17/12 1123 10/17/12 1733 10/17/12 1734 10/18/12 0509  HGB 15.7  --   --  15.1 13.4  WBC 6.0  --   --  14.5* 21.4*  PLT 195  --   --  187 180  NA 140  --   --   --  138  K 3.1*  --   --   --  3.4*  CL 102  --   --   --  105  CO2 24  --   --   --  22  GLUCOSE 134*  --   --   --  192*  BUN 23  --   --   --  19  CREATININE 0.80  --   --  0.81 0.61  CALCIUM 9.6  --   --   --  8.9  AST 28  --   --   --  81*  ALT 39  --   --   --  78*  ALKPHOS 90  --   --   --  61  BILITOT 1.0  --   --   --  0.9  PROT 7.3  --   --   --  6.3  ALBUMIN 3.6  --   --   --  2.8*  LATICACIDVEN 3.5*  --   --   --   --   TROPONINI <0.30  --   --   --   --   PROCALCITON  --   --  26.14  --  19.16  PROBNP 107.6  --   --   --   --   PHART  --  7.457*  --   --   --   PCO2ART  --  29.0*  --   --   --   PO2ART  --  43.0*  --   --   --     Recent Labs Lab 10/17/12 1703 10/17/12 2012 10/17/12 2332 10/18/12 0341 10/18/12 0726  GLUCAP 152* 218* 186* 175* 198*    CXR: bibasilr hazziness, emphysema  ASSESSMENT / PLAN:  PULMONARY A: Acute on chronic hypoxic respiratory failure with by basilar infiltrates compatible with pneumonia rule out aspiration with high level reflux versus community-acquired pneumonia the patient is severely immunosuppressed and needs to be covered broadly P:   Intensive neb treatments Oxygen titration IV Medrol, can reduce this CT reviewed No distress, no need nimv  CARDIOVASCULAR A: No acute issues, R. history of coronary artery disease with bypass surgery 2006 P:  Dc tele Heparin subcutaneous for DVT prophylaxis  RENAL A:  Hypo k  P:   Monitor in am after  kcl  GASTROINTESTINAL A:  History of high level reflux disease P:   IV proton pump inhibitor twice daily May need Gi evaluation with dilated esophagus pre dc, treat as above for now  HEMATOLOGIC A:  No acute hematologic issues P:  Sub q hep  INFECTIOUS A:  Bibasilar pneumonia rule out aspiration versus community acquired but the patient is immunosuppressed P:   IV Zosyn, azithro, narrow in am for sure Dc vanc Follow cultures when available  ENDOCRINE A:  History of diabetes and likely had be hyperglycemic with steroids  P:   ICU hyperglycemia protocol Reduce steroids  NEUROLOGIC A:  No acute issues P:   Monitor  TODAY'S SUMMARY: 62 year old white male with rheumatoid arthritis and severe bullous emphysema now with by basilar infiltrates compatible with either aspiration pneumonia. Improved clinically, to floor, reduce steroids, may nee dGI evaluation pre dc   Mcarthur Rossetti. Tyson Alias, MD, FACP Pgr: [phone] [facility]

## 2012-10-19 ENCOUNTER — Encounter (HOSPITAL_COMMUNITY)
Admission: EM | Disposition: A | Discharge status: Home / Self Care | Payer: Self-pay | Source: Home / Self Care | Attending: Critical Care Medicine

## 2012-10-19 ENCOUNTER — Encounter (HOSPITAL_COMMUNITY): Payer: Self-pay | Admitting: Physician Assistant

## 2012-10-19 ENCOUNTER — Inpatient Hospital Stay (HOSPITAL_COMMUNITY): Payer: BC Managed Care – PPO

## 2012-10-19 DIAGNOSIS — K219 Gastro-esophageal reflux disease without esophagitis: Secondary | ICD-10-CM

## 2012-10-19 DIAGNOSIS — K2270 Barrett's esophagus without dysplasia: Secondary | ICD-10-CM

## 2012-10-19 HISTORY — PX: ESOPHAGOGASTRODUODENOSCOPY: SHX5428

## 2012-10-19 LAB — PROCALCITONIN: Procalcitonin: 9.55 ng/mL

## 2012-10-19 LAB — CBC WITH DIFFERENTIAL/PLATELET
Eosinophils Absolute: 0 10*3/uL (ref 0.0–0.7)
Hemoglobin: 13.1 g/dL (ref 13.0–17.0)
Lymphocytes Relative: 5 % — ABNORMAL LOW (ref 12–46)
Lymphs Abs: 0.9 10*3/uL (ref 0.7–4.0)
MCH: 32.2 pg (ref 26.0–34.0)
Monocytes Relative: 5 % (ref 3–12)
Neutro Abs: 16.1 10*3/uL — ABNORMAL HIGH (ref 1.7–7.7)
Neutrophils Relative %: 90 % — ABNORMAL HIGH (ref 43–77)
Platelets: 199 10*3/uL (ref 150–400)
RBC: 4.07 MIL/uL — ABNORMAL LOW (ref 4.22–5.81)
WBC: 17.8 10*3/uL — ABNORMAL HIGH (ref 4.0–10.5)

## 2012-10-19 LAB — CULTURE, RESPIRATORY W GRAM STAIN

## 2012-10-19 LAB — BASIC METABOLIC PANEL
GFR calc non Af Amer: >90 mL/min (ref >90)
Glucose, Bld: 123 mg/dL — ABNORMAL HIGH (ref 70–99)
Potassium: 3.3 mEq/L — ABNORMAL LOW (ref 3.5–5.1)
Sodium: 144 mEq/L (ref 135–145)

## 2012-10-19 LAB — GLUCOSE, CAPILLARY
Glucose-Capillary: 114 mg/dL — ABNORMAL HIGH (ref 70–99)
Glucose-Capillary: 150 mg/dL — ABNORMAL HIGH (ref 70–99)
Glucose-Capillary: 190 mg/dL — ABNORMAL HIGH (ref 70–99)
Glucose-Capillary: 136 mg/dL — ABNORMAL HIGH (ref 70–99)

## 2012-10-19 SURGERY — EGD (ESOPHAGOGASTRODUODENOSCOPY)
Anesthesia: Moderate Sedation

## 2012-10-19 MED ORDER — DIPHENHYDRAMINE HCL 50 MG/ML IJ SOLN
INTRAMUSCULAR | Status: AC
  Filled 2012-10-19: qty 1

## 2012-10-19 MED ORDER — ALBUTEROL SULFATE (5 MG/ML) 0.5% IN NEBU: 2.5000 mg | INHALATION_SOLUTION | Freq: Four times a day (QID) | RESPIRATORY_TRACT | Status: DC | PRN

## 2012-10-19 MED ORDER — MOMETASONE FURO-FORMOTEROL FUM 200-5 MCG/ACT IN AERO
2.0000 | INHALATION_SPRAY | Freq: Two times a day (BID) | RESPIRATORY_TRACT | Status: DC
  Administered 2012-10-19 – 2012-10-21 (×4): 2 via RESPIRATORY_TRACT
  Filled 2012-10-19: qty 8.8

## 2012-10-19 MED ORDER — FENTANYL CITRATE 0.05 MG/ML IJ SOLN
INTRAMUSCULAR | Status: AC
  Filled 2012-10-19: qty 2

## 2012-10-19 MED ORDER — INSULIN ASPART 100 UNIT/ML ~~LOC~~ SOLN
0.0000 [IU] | Freq: Three times a day (TID) | SUBCUTANEOUS | Status: DC
  Administered 2012-10-19: 2 [IU] via SUBCUTANEOUS
  Administered 2012-10-20: 3 [IU] via SUBCUTANEOUS
  Administered 2012-10-20: 2 [IU] via SUBCUTANEOUS

## 2012-10-19 MED ORDER — MIDAZOLAM HCL 10 MG/2ML IJ SOLN
INTRAMUSCULAR | Status: DC | PRN
  Administered 2012-10-19: 1 mg via INTRAVENOUS
  Administered 2012-10-19: 2 mg via INTRAVENOUS
  Administered 2012-10-19: 1 mg via INTRAVENOUS
  Administered 2012-10-19: 2 mg via INTRAVENOUS

## 2012-10-19 MED ORDER — TIOTROPIUM BROMIDE MONOHYDRATE 18 MCG IN CAPS
18.0000 ug | ORAL_CAPSULE | Freq: Every day | RESPIRATORY_TRACT | Status: DC
  Administered 2012-10-20 – 2012-10-21 (×2): 18 ug via RESPIRATORY_TRACT
  Filled 2012-10-19: qty 5

## 2012-10-19 MED ORDER — FENTANYL CITRATE 0.05 MG/ML IJ SOLN
INTRAMUSCULAR | Status: DC | PRN
  Administered 2012-10-19: 25 ug via INTRAVENOUS
  Administered 2012-10-19 (×3): 12.5 ug via INTRAVENOUS

## 2012-10-19 MED ORDER — BUTAMBEN-TETRACAINE-BENZOCAINE 2-2-14 % EX AERO
INHALATION_SPRAY | CUTANEOUS | Status: DC | PRN
  Administered 2012-10-19: 2 via TOPICAL

## 2012-10-19 MED ORDER — ACETAMINOPHEN 325 MG PO TABS
650.0000 mg | ORAL_TABLET | Freq: Four times a day (QID) | ORAL | Status: DC | PRN
  Administered 2012-10-19: 650 mg via ORAL
  Filled 2012-10-19: qty 2

## 2012-10-19 MED ORDER — MIDAZOLAM HCL 5 MG/ML IJ SOLN
INTRAMUSCULAR | Status: AC
  Filled 2012-10-19: qty 2

## 2012-10-19 NOTE — Progress Notes (Signed)
Pt c/o nausea caused by his nebulizer treatment medications. Vomited 500cc of mostly undigested food. Refused his 2am nebulizer treatment. sats remain at 97% on 5l of O2. Vital signs stable.

## 2012-10-19 NOTE — Progress Notes (Signed)
Inpatient Diabetes Program Recommendations  AACE/ADA: New Consensus Statement on Inpatient Glycemic Control (2013)  Target Ranges:  Prepandial:   less than 140 mg/dL      Peak postprandial:   less than 180 mg/dL (1-2 hours)      Critically ill patients:  140 - 180 mg/dL   Reason for Visit: Results for JOQUAN, LOTZ (MRN [id number]) as of 10/19/2012 13:04  Ref. Range 10/19/2012 00:02 10/19/2012 04:44 10/19/2012 07:32 10/19/2012 10:57  Glucose-Capillary Latest Range: 70-99 mg/dL 782 (H) 956 (H) 213 (H) 155 (H)   Please discontinue ICU Glycemic control protocol and change Novolog correction to sensitive q 4 hours.  Will follow.

## 2012-10-19 NOTE — Interval H&P Note (Signed)
History and Physical Interval Note:  10/19/2012 2:19 PM  Joseph [name]  has presented today for surgery, with the diagnosis of trouble swallowing and severe reflux.  The various methods of treatment have been discussed with the patient and family. After consideration of risks, benefits and other options for treatment, the patient has consented to  Procedure(s) with comments: ESOPHAGOGASTRODUODENOSCOPY (EGD) (N/A) - the dilatation is possible.  BALLOON DILATION (N/A) as a surgical intervention .  The patient's history has been reviewed, patient examined, no change in status, stable for surgery.  I have reviewed the patient's chart and labs.  Questions were answered to the patient's satisfaction.     [name]

## 2012-10-19 NOTE — Op Note (Addendum)
Joseph [name] [facility] [address] [place], [address]   ENDOSCOPY PROCEDURE REPORT  PATIENT: Joseph [name], Joseph [name]  MR#: [id number] BIRTHDATE: 07/14/1951 , 61  yrs. old GENDER: Male ENDOSCOPIST: Joseph Carwin, MD REFERRED BY:  Nelda Bucks, M.D. PROCEDURE DATE:  10/19/2012 PROCEDURE:  EGD w/ biopsy ASA CLASS:     Class III INDICATIONS:  History of esophageal reflux.since 1997, worse x 1 year, admitted with aspiration pneumonia  , pt admits to nocturnal regurgitation of food and acid, he is on Fosamax, he is a diabetic, he is on narcotics for RA MEDICATIONS: These medications were titrated to patient response per physician's verbal order, Fentanyl-Detailed 62.5 mg IV, and Versed 6 mg IV TOPICAL ANESTHETIC: Cetacaine Spray  DESCRIPTION OF PROCEDURE: After the risks benefits and alternatives of the procedure were thoroughly explained, informed consent was obtained.  The Pentax Gastroscope [id number] endoscope was introduced through the mouth and advanced to the second portion of the duodenum. Without limitations.  The instrument was slowly withdrawn as the mucosa was fully examined.    ESOPHAGUS: Barrett's esophagus present , at 35 cm- 38 cm , 3 cm Barrett's esophagus, irregular z-line , no acute erosions or a stricture,, no hiatal hernia,,free reflux of bile and acid into esophagus, biopsies taken fron g-e junction for confirmation STOMACH: gastric mucosa appeared normal, small amount of retained bile, no food, normal pyloric outlet  Retroflexed views revealed no abnormalities.     The scope was then withdrawn from the patient and the procedure completed.  COMPLICATIONS: There were no complications. ENDOSCOPIC IMPRESSION:  1. 3 cm Barrett's esiophagus without stricture or esophagitis, biopsies taken 2. reflux of gaastric content observed during the procedure, there was no hiatal hernia 3. no ev idence of gastric retention  RECOMMENDATIONS: 1.  Await  biopsy results 2.  Anti-reflux regimen to be follow 3.  Continue PPI 4. Stop Fosamax which contributes to GERD, relatively contraindicated in esophageal disorders, should consider alternative approach to osreopenia 5. Minimize/stop narcotics which delay gastric emptying and exaggerate reflux 6. Obtain gastri emptying scan and Barium esophagram to assess if pt would be a candidate for antireflux procedure  REPEAT EXAM: recall depends on Biopsies  eSigned:  Hart Carwin, MD 10/19/2012 2:52 PM   CC:  PATIENT NAME:  Joseph [name], Joseph [name] MR#: [id number]

## 2012-10-19 NOTE — Consult Note (Signed)
[facility] Consult: 9:49 AM 10/19/2012   Referring Provider: Dr Tyson Alias  Primary Care Physician:  Willow Ora, MD Primary Gastroenterologist:  Dr. [name]   Reason for Consultation:  Dilated esophagus on chest CT HPI: Joseph [name] is a 62 y.o. male.  Hx steroid (6 mg Prednisone) and MTX for rheum arthritis management.  Oxygen dependent Emphysema and COPD.    Many years of GERD.  3 months ago the Protonix dose was augmented with second dose taken at HS.  He still continues with breakthrough reflux, regurge and chest burning.  Rarely nauseated.  No use of NSAIDs but does take Fosamax weekly.  Also takes daily po Iron and oral B12 daily. Never had colonoscopy or EGD.  Rarely chokes on po and no nausea.    In last few months has intermittent loose, non-bloody stools that occur once or twice daily, in same pattern as formed BMS.  Has taken many courses of ABX for foot ulcers.  Taking chronic daily narcotics for arthritis pain for many years.   1 AM today, many hours after PM meal, he became nauseated by the odor of the respiratory nebulizer and vomitted undigested food.  This has not recurred.     Past Medical History  Diagnosis Date  . Rheumatoid arthritis   . Psoriasis   . Hypertension   . Gastroesophageal reflux disease   . COPD (chronic obstructive pulmonary disease)   . CAD (coronary artery disease)     MI 2007, CABG  . Osteoporosis     per DEXA 05-2008  . Peripheral polyneuropathy     Severe, causing problems with his feet, see surgeries  . Diabetes mellitus     Dx 07-2008 A1C 6.2  . Gastritis   . CHF (congestive heart failure)   . Emphysema     Past Surgical History  Procedure Laterality Date  . Foot surgery      to tendon release and repair of osteomyelitis   . Cholecystectomy    . Coronary artery bypass graft  2007    (LIMA to the LAD, left radial to obtuse marginal, SVG to first diagonal, SVG to PDA. His  ejection fraction to 50-55%  . Achiles tendon surgery  8/09  . Toe amputation  4-12    d/t a deformity, found to have osteomyelitis, complicated by post-op foot strtess FX    Prior to Admission medications   Medication Sig Start Date End Date Taking? Authorizing Provider  albuterol (PROVENTIL,VENTOLIN) 90 MCG/ACT inhaler Inhale 2 puffs into the lungs every 4 (four) hours as needed for wheezing or shortness of breath. 04/09/11  Yes Waymon Budge, MD  alendronate (FOSAMAX) 70 MG tablet Take 1 tablet (70 mg total) by mouth every 7 (seven) days. Take with a full glass of water on an empty stomach. 03/12/11  Yes Wanda Plump, MD  atorvastatin (LIPITOR) 80 MG tablet Take 1 tablet (80 mg total) by mouth daily. 02/18/12  Yes Rollene Rotunda, MD  Fluticasone-Salmeterol (ADVAIR DISKUS) 100-50 MCG/DOSE AEPB 1 puff then rinse mouth, twice daily 03/16/12 03/16/13 Yes Clinton D Young, MD  Lancets [facility] ULTRASOFT) lancets Use as instructed 12/23/11 12/22/12 Yes Wanda Plump, MD  metoprolol tartrate (LOPRESSOR) 25 MG tablet Take 1 tablet (25 mg total) by mouth 2 (two) times daily. 02/18/12  Yes Rollene Rotunda, MD  nitroGLYCERIN (NITROSTAT) 0.4 MG SL tablet Place 1 tablet (0.4 mg total) under the tongue every 5 (five) minutes as needed for chest pain. 02/18/12  Yes Fayrene Fearing  Hochrein, MD  NON FORMULARY oxygen 3L per [place] 24 hours a day   Yes Historical Provider, MD  ONE TOUCH ULTRA TEST test strip USE AS DIRECTED 04/12/12  Yes Wanda Plump, MD  potassium chloride SA (K-DUR,KLOR-CON) 20 MEQ tablet Take 1 tablet (20 mEq total) by mouth 2 (two) times daily. 2 by mouth in the morning and 1 by mouth at 4 pm 02/18/12  Yes Rollene Rotunda, MD  Respiratory Therapy Supplies (FLUTTER) DEVI Blow through several times, 2 or 3 sets per day as needed 09/16/11  Yes Waymon Budge, MD  tiotropium (SPIRIVA HANDIHALER) 18 MCG inhalation capsule Place 1 capsule (18 mcg total) into inhaler and inhale daily. 1 puff in AM 03/16/12 03/16/13 Yes Waymon Budge, MD  UNABLE TO FIND Med Name: Incentive Spirometer Use as directed 08/01/11  Yes Waymon Budge, MD  amitriptyline (ELAVIL) 75 MG tablet Take 100 mg by mouth 3 (three) times daily.     Historical Provider, MD  Ascorbic Acid (VITAMIN C) 500 MG tablet Take 500 mg by mouth daily.      Historical Provider, MD  aspirin 81 MG tablet Take 81 mg by mouth daily.      Historical Provider, MD  calamine ([facility] CALAMINE) lotion      Historical Provider, MD  Carbonyl Iron (PERFECT IRON PO) Take by mouth. 1 tab daily     Historical Provider, MD  folic acid (FOLVITE) 400 MCG tablet Take 800 mcg by mouth daily.     Historical Provider, MD  furosemide (LASIX) 20 MG tablet Take 60 mg by mouth daily. 2 in the morning and 1 in the afternoon 02/18/12   Rollene Rotunda, MD  methotrexate (RHEUMATREX) 2.5 MG tablet Take 8 tablets every week     Historical Provider, MD  morphine (MS CONTIN) 30 MG 12 hr tablet Take 30 mg by mouth 3 (three) times daily.  12/12/11   Historical Provider, MD  omeprazole (PRILOSEC) 20 MG capsule  05/13/12   Wanda Plump, MD  oxyCODONE-acetaminophen (PERCOCET) 7.5-325 MG per tablet Take 1 tablet by mouth. 3 times a day    Historical Provider, MD  predniSONE (DELTASONE) 10 MG tablet pack TAKES 6 MG AM .    Historical Provider, MD  pregabalin (LYRICA) 150 MG capsule Take 150 mg by mouth 3 (three) times daily.      Historical Provider, MD  vitamin B-12 (CYANOCOBALAMIN) 250 MCG tablet Take 250 mcg by mouth daily.      Historical Provider, MD    Scheduled Meds: . albuterol  2.5 mg Nebulization Q6H  . aspirin  81 mg Oral Daily  . atorvastatin  80 mg Oral q1800  . azithromycin  500 mg Oral Q24H  . folic acid  1 mg Oral Daily  . heparin  5,000 Units Subcutaneous Q8H  . insulin aspart  2-6 Units Subcutaneous Q4H  . ipratropium  0.5 mg Nebulization Q6H  . methylPREDNISolone (SOLU-MEDROL) injection  40 mg Intravenous Q12H  . metoprolol tartrate  25 mg Oral BID  . morphine  30 mg Oral Q12H  .  pantoprazole  40 mg Oral BID  . piperacillin-tazobactam (ZOSYN)  IV  3.375 g Intravenous Q8H  . pregabalin  150 mg Oral TID  . sodium chloride  3 mL Intravenous Q12H   Infusions: . sodium chloride 50 mL/hr at 10/19/12 0600   PRN Meds: sodium chloride, sodium chloride   Allergies as of 10/17/2012  . (No Known Allergies)    Family  History  Problem Relation Age of Onset  . Heart disease Mother   . Heart disease Father   . Heart attack Father     x2  . Colon cancer Neg Hx   . Prostate cancer Neg Hx     History   Social History  . Marital Status: Married    Spouse Name: N/A    Number of Children: N/A  . Years of Education: N/A   Occupational History  . disabled-retired    Social History Main Topics  . Smoking status: Former Smoker -- 1.00 packs/day for 40 years    Quit date: 08/26/2005  . Smokeless tobacco: Former Neurosurgeon    Quit date: 04/02/2006  . Alcohol Use: No  . Drug Use: Not on file  . Sexually Active: Not on file     REVIEW OF SYSTEMS: Constitutional:  Gradual weight gain of 20 to 30 # in last 5 to 6 years.  Foot pain and ulcers make exercise difficult ENT:  No nose bleeds Pulm:  DOE and productive cough improved CV:  No chest pain, no palps GU:  No blood in urine, no oliguria GI:  Per HPI Heme:  On po Iron for many years.    Transfusions:  None that he recalls Neuro:  No falls, seizures, dizziness.  Balance is a bit challenged Derm:  Foot sores and osteo are chronic. Endocrine:  No excessive thirst.  No sweats Immunization:  Flu shot current as is Pneumovax, Tdap. Travel:  None recently.    PHYSICAL EXAM: Vital signs in last 24 hours: Temp:  [97.8 F (36.6 C)-98.4 F (36.9 C)] 98.4 F (36.9 C) (02/24 0439) Pulse Rate:  [92-103] 92 (02/24 0439) Resp:  [18-21] 18 (02/24 0439) BP: (99-133)/(62-80) 133/68 mmHg (02/24 0439) SpO2:  [89 %-97 %] 94 % (02/24 0656)  General: Obese, pleasant WM looks his age.  NAD. Head:  No asymmetry or facial  swelling  Eyes:  No icterus or pallor Ears:  Slightly HOH.  Nose:  No discharge or congestion Mouth:  No sores or ulcers.  Edentulous.  Neck:  No masses or bruits Lungs:  Clear B.  No cough.  + DOE.  Heart: RRR.  No MRG. Abdomen:  Soft, obese, ND, NT.  No HSM.   Rectal: deferred   Musc/Skeltl: rheumatoid joint deformity in fingers Extremities:  No pedal edema.  Wounds on toes are bandaged, no blood on bandage  Neurologic:  Oriented x 3.  Good histrorian.  No tremor, no limb weakness Skin:  Psoriatic plaques on UE, LE and trunk. Tattoos:  None seen Nodes:  No inguinal adenopathy   Psych:  Pleasant, anxious, cooperative.   Intake/Output from previous day: 02/23 0701 - 02/24 0700 In: 2333 [P.O.:1080; I.V.:1153; IV Piggyback:100] Out: 500 [Emesis/NG output:500] Intake/Output this shift:    LAB RESULTS:  Recent Labs  10/17/12 1734 10/18/12 0509 10/19/12 0700  WBC 14.5* 21.4* 17.8*  HGB 15.1 13.4 13.1  HCT 41.5 37.8* 37.0*  PLT 187 180 199  MCV     90   BMET Lab Results  Component Value Date   NA 144 10/19/2012   NA 138 10/18/2012   NA 140 10/17/2012   K 3.3* 10/19/2012   K 3.4* 10/18/2012   K 3.1* 10/17/2012   CL 109 10/19/2012   CL 105 10/18/2012   CL 102 10/17/2012   CO2 25 10/19/2012   CO2 22 10/18/2012   CO2 24 10/17/2012   GLUCOSE 123* 10/19/2012   GLUCOSE 192* 10/18/2012  GLUCOSE 134* 10/17/2012   BUN 20 10/19/2012   BUN 19 10/18/2012   BUN 23 10/17/2012   CREATININE 0.60 10/19/2012   CREATININE 0.61 10/18/2012   CREATININE 0.81 10/17/2012   CALCIUM 9.4 10/19/2012   CALCIUM 8.9 10/18/2012   CALCIUM 9.6 10/17/2012   LFT  Recent Labs  10/17/12 1100 10/18/12 0509  PROT 7.3 6.3  ALBUMIN 3.6 2.8*  AST 28 81*  ALT 39 78*  ALKPHOS 90 61  BILITOT 1.0 0.9   PT/INR Lab Results  Component Value Date   INR 1.00 10/11/2009   INR 1.2 03/29/2008   Hepatitis Panel No results found for this basename: HEPBSAG, HCVAB, HEPAIGM, HEPBIGM,  in the last 72 hours C-Diff No  components found with this basename: cdiff    Drugs of Abuse  No results found for this basename: labopia, cocainscrnur, labbenz, amphetmu, thcu, labbarb     RADIOLOGY STUDIES: Ct Angio Chest Pe W/cm &/or Wo Cm 10/17/2012 02/24/2008.  Findings: Lung windows demonstrate redemonstration of severe centrilobular emphysema with bullous disease in the apices.  Stable subpleural nodularity at the right middle lobe on image 73/series 6. Minimal exclusion of the right lung base.  1.0 cm left upper lobe lung nodule on image 31/series 6, new since the prior exam.  Right lower lobe lung nodule which measures 5 mm on image 76/series 6 and was present back into 1009, measuring 4 mm (difference likely due to slice selection).  Progression of interstitial lung disease at the bases, including since the abdominal CT of 10/11/2009.  This is a combination of traction bronchiectasis with patchy areas of ground-glass opacity.  Soft tissue windows:  The quality of this exam for evaluation of pulmonary embolism is moderate.  The primary limitation is suboptimal bolus timing, with the bolus centered in the pulmonary veins.  No embolism to the segmental level.  Lung bases are relatively well evaluated, without embolism identified.  Normal aortic caliber without dissection.  Normal heart size, without pericardial effusion.  Small middle mediastinal nodes.  An azygo-esophageal recess node measures 1.3 cm, increased from 1.1 cm on the prior.  Prominent infrahilar nodal tissue bilaterally.  1.0 cm node on image 54/series 4.  Small hiatal hernia.  Increase in the dilated, fluid-filled esophagus, including on image 60/series 4. Prior median sternotomy.  Limited abdominal imaging demonstrates probable hepatic steatosis. No acute osseous abnormality.  IMPRESSION:  1.  No evidence of pulmonary embolism.  Moderate quality exam, with suboptimal bolus timing. 2.  Worsening pulmonary aeration.  Severe centrilobular and paraseptal emphysema with  progressive interstitial lung disease at the bases.  Favor chronic aspiration.  Nonspecific interstitial pneumonitis or post infectious fibrosis could look similar. 3.  Dilated, fluid-filled esophagus suggesting dysmotility or gastroesophageal reflux.  This could predispose the patient to chronic aspiration. 4.  Mild thoracic adenopathy, favored to be reactive.  Consider follow-up with chest CT at 3 months to confirm stability or resolution. 5.  1.0 cm left upper lobe lung nodule.  Can exclude primary bronchogenic carcinoma.  Consider further characterization with PET.  These results will be called to the ordering clinician or representative by the Radiologist Assistant, and communication documented in the PACS Dashboard.   Original Report Authenticated By: Jeronimo Greaves, M.D.    Dg Chest Port 1 View 10/19/2012    IMPRESSION: Findings most suggestive of pulmonary edema superimposed on severe emphysematous change.   Original Report Authenticated By: Tacey Ruiz, MD    Dg Chest [facility] 10/18/2012  IMPRESSION: Worsening  bibasilar infiltrates.   Original Report Authenticated By: Irish Lack, M.D.      ENDOSCOPIC STUDIES: Ever had EGD or colonoscopy   IMPRESSION: *  Chronic GERD, refractory to medical mgt.  Esophagus dilated and dysmotility suggested by chest CT.  *  Aspiration pna, felt to be chronically aspirating.  Associated thoracic adenopathy *  Intermittent loose stools, non-bloody, non-diarrheal volume.  *  Rheum arthritis on chronic MTX and Predsisone *  Chronic narcotics for arthritic pain *  Hypokalemia.  *  Mild elevation of transaminases.  GB is out, negative IOC at lap chole in 09/2009. Fatty liver noted on 09/2009  +-+ *+ ++ und.  *  Polyneuropathy.  *  Emphysema. Pneumonitis. Thoracic adenopathy. *  Chronic foot infection requiring right toe amputation and debridements.  *  Hx Chronic anemia, has taken po Iron and B 12 for many years. Not currently anemic *  Osteoporosis,  takes weekly Fosamax for this.  This med is contraindicated in pts with dysphagia and dysmotility.    PLAN: *  Needs EGD and perhaps an esophagram and SLP eval .  Will d/w Dr Juanda Chance.  Pt aware of potential for EGD and agreeable so long as we feel lungs can handle the procedure.    LOS: 2 days   Jennye Moccasin  10/19/2012, 9:49 AM Pager: [phone]  I have reviewed the above note, examined the patient and agree with plan of treatment.He has a severe GERD with aspiration, may be a candidate for Nissen Fundoplication. Would stop narcotics and Fosamax. R/O Barrett's esophagus.  [facility] Pager # [phone]

## 2012-10-19 NOTE — Progress Notes (Addendum)
PULMONARY  / CRITICAL CARE MEDICINE  Name: Joseph [name] MRN: [id number] DOB: 05/16/1951    ADMISSION DATE:  10/17/2012   CHIEF COMPLAINT:  Dyspnea and hypoxemia  BRIEF PATIENT DESCRIPTION: 62 year old white male with severe chronic obstructive lung disease admitted with bilateral lower lobe pneumonia question of aspiration versus community-acquired.   The patient noted the subacute onset of dyspnea cough and congestion over the last 24 hours. The patient has a baseline need of oxygen 3 L. Patient has severe COPD and is on chronic inhaled Advair and Spiriva at home. Patient was brought to the emergency room and transported to the ICU for further inpatient care on 10/17/2012.  SIGNIFICANT EVENTS / STUDIES:  10/17/12 CT scan of chest: Revealed no pulmonary embolism by basilar infiltrates and severe bullous emphysema 2/23- [place] O2 , no distress 2/24 EGD>>  LINES / TUBES: PIV  CULTURES: Blood cultures x2  10/17/12 Respiratory culture 10/17/12>>>enterobacter  ANTIBIOTICS: Zosyn 10/17/2012 Vancomycin 10/17/2012>>2/23  SUBJECTIVE:  PT seen in endoscopy VITAL SIGNS: Temp:  [97.8 F (36.6 C)-98.4 F (36.9 C)] 97.8 F (36.6 C) (02/24 1347) Pulse Rate:  [83-103] 83 (02/24 1347) Resp:  [15-22] 22 (02/24 1415) BP: (130-152)/(68-94) 144/87 mmHg (02/24 1415) SpO2:  [92 %-97 %] 93 % (02/24 1415) HEMODYNAMICS: Hemodynamically stable      INTAKE / OUTPUT: Intake/Output     02/23 0701 - 02/24 0700 02/24 0701 - 02/25 0700   P.O. 1080 240   I.V. (mL/kg) 1153 (10.1)    IV Piggyback 100    Total Intake(mL/kg) 2333 (20.5) 240 (2.1)   Urine (mL/kg/hr)     Emesis/NG output 500 (0.2)    Total Output 500     Net +1833 +240        Urine Occurrence 5 x    Stool Occurrence 2 x      PHYSICAL EXAMINATION: General:  No acute distress Neuro:  No neurologic deficits HEENT:  Dry mucous membranes Cardiovascular:  Regular rate and rhythm normal S1-S2 no murmur or gallop Lungs:  CTA , reduced  bases Abdomen:  Soft nontender no organomegaly Musculoskeletal:  Chronic rheumatoid arthritis changes of both upper and lower extremities Skin:  Clear  LABS:  Recent Labs Lab 10/17/12 1100 10/17/12 1123 10/17/12 1733 10/17/12 1734 10/18/12 0509 10/19/12 0700  HGB 15.7  --   --  15.1 13.4 13.1  WBC 6.0  --   --  14.5* 21.4* 17.8*  PLT 195  --   --  187 180 199  NA 140  --   --   --  138 144  K 3.1*  --   --   --  3.4* 3.3*  CL 102  --   --   --  105 109  CO2 24  --   --   --  22 25  GLUCOSE 134*  --   --   --  192* 123*  BUN 23  --   --   --  19 20  CREATININE 0.80  --   --  0.81 0.61 0.60  CALCIUM 9.6  --   --   --  8.9 9.4  AST 28  --   --   --  81*  --   ALT 39  --   --   --  78*  --   ALKPHOS 90  --   --   --  61  --   BILITOT 1.0  --   --   --  0.9  --   PROT 7.3  --   --   --  6.3  --   ALBUMIN 3.6  --   --   --  2.8*  --   LATICACIDVEN 3.5*  --   --   --   --   --   TROPONINI <0.30  --   --   --   --   --   PROCALCITON  --   --  26.14  --  19.16 9.55  PROBNP 107.6  --   --   --   --   --   PHART  --  7.457*  --   --   --   --   PCO2ART  --  29.0*  --   --   --   --   PO2ART  --  43.0*  --   --   --   --     Recent Labs Lab 10/18/12 2002 10/19/12 0002 10/19/12 0444 10/19/12 0732 10/19/12 1057  GLUCAP 214* 150* 190* 114* 155*    CXR: bibasilr hazziness, emphysema  ASSESSMENT / PLAN:  PULMONARY A: Acute on chronic hypoxic respiratory failure with by basilar infiltrates compatible with pneumonia rule out aspiration with high level reflux versus community-acquired pneumonia the patient is severely immunosuppressed and needs to be covered broadly P:   BD nebs caused nausea>>change to spiriva /dulera Oxygen titration Titrate steriods CT reviewed No distress, no need nimv  CARDIOVASCULAR A: No acute issues, R. history of coronary artery disease with bypass surgery 2006 P:  Dc tele Heparin subcutaneous for DVT prophylaxis  RENAL A:  Hypo k  P:    Monitor in am after kcl  GASTROINTESTINAL A:  History of high level reflux disease P:   IV proton pump inhibitor twice daily GI eval apprec for EGD and poss esoph dilation  HEMATOLOGIC A:  No acute hematologic issues P:  Sub q hep  INFECTIOUS A:  Bibasilar pneumonia rule out aspiration versus community acquired but the patient is immunosuppressed P:   Cont IV Zosyn, azithro  Follow cultures when available  ENDOCRINE A:  History of diabetes and likely had be hyperglycemic with steroids  P:   SSI Reduce steroids  NEUROLOGIC A:  No acute issues P:   Monitor  Caryl Bis  [phone]  Cell  [phone]  If no response or cell goes to voicemail, call beeper [phone]

## 2012-10-20 ENCOUNTER — Inpatient Hospital Stay (HOSPITAL_COMMUNITY): Payer: BC Managed Care – PPO

## 2012-10-20 ENCOUNTER — Encounter (HOSPITAL_COMMUNITY): Payer: Self-pay | Admitting: Internal Medicine

## 2012-10-20 ENCOUNTER — Encounter: Payer: Self-pay | Admitting: Internal Medicine

## 2012-10-20 DIAGNOSIS — K3184 Gastroparesis: Secondary | ICD-10-CM

## 2012-10-20 LAB — CBC
HCT: 36.8 % — ABNORMAL LOW (ref 39.0–52.0)
Hemoglobin: 12.8 g/dL — ABNORMAL LOW (ref 13.0–17.0)
MCH: 31.7 pg (ref 26.0–34.0)
MCHC: 34.8 g/dL (ref 30.0–36.0)

## 2012-10-20 LAB — BASIC METABOLIC PANEL
BUN: 24 mg/dL — ABNORMAL HIGH (ref 6–23)
Creatinine, Ser: 0.61 mg/dL (ref 0.50–1.35)
GFR calc non Af Amer: >90 mL/min (ref >90)
Glucose, Bld: 156 mg/dL — ABNORMAL HIGH (ref 70–99)
Potassium: 3.4 mEq/L — ABNORMAL LOW (ref 3.5–5.1)

## 2012-10-20 LAB — GLUCOSE, CAPILLARY
Glucose-Capillary: 135 mg/dL — ABNORMAL HIGH (ref 70–99)
Glucose-Capillary: 141 mg/dL — ABNORMAL HIGH (ref 70–99)
Glucose-Capillary: 116 mg/dL — ABNORMAL HIGH (ref 70–99)

## 2012-10-20 LAB — RESPIRATORY VIRUS PANEL
Adenovirus: NOT DETECTED
Metapneumovirus: NOT DETECTED
Parainfluenza 1: NOT DETECTED
Parainfluenza 2: NOT DETECTED
Rhinovirus: NOT DETECTED

## 2012-10-20 MED ORDER — TECHNETIUM TC 99M SULFUR COLLOID
2.0000 | Freq: Once | INTRAVENOUS | Status: AC | PRN
  Administered 2012-10-20: 2 via INTRAVENOUS

## 2012-10-20 MED ORDER — POTASSIUM CHLORIDE CRYS ER 20 MEQ PO TBCR
40.0000 meq | EXTENDED_RELEASE_TABLET | Freq: Once | ORAL | Status: AC
  Administered 2012-10-20: 40 meq via ORAL
  Filled 2012-10-20: qty 2

## 2012-10-20 MED ORDER — CIPROFLOXACIN HCL 750 MG PO TABS
750.0000 mg | ORAL_TABLET | Freq: Two times a day (BID) | ORAL | Status: DC
  Administered 2012-10-20 – 2012-10-21 (×3): 750 mg via ORAL
  Filled 2012-10-20 (×5): qty 1

## 2012-10-20 MED ORDER — TECHNETIUM TC 99M SULFUR COLLOID: 2.0000 | Freq: Once | INTRAVENOUS | Status: AC | PRN

## 2012-10-20 MED ORDER — PREDNISONE 20 MG PO TABS
40.0000 mg | ORAL_TABLET | Freq: Every day | ORAL | Status: DC
  Administered 2012-10-20 – 2012-10-21 (×2): 40 mg via ORAL
  Filled 2012-10-20 (×3): qty 2

## 2012-10-20 MED ORDER — METOCLOPRAMIDE HCL 10 MG PO TABS
10.0000 mg | ORAL_TABLET | Freq: Every day | ORAL | Status: DC
  Administered 2012-10-20: 10 mg via ORAL
  Filled 2012-10-20 (×2): qty 1

## 2012-10-20 NOTE — Progress Notes (Signed)
[facility] Daily Rounding Note 10/20/2012, 10:59 AM  SUBJECTIVE:       Reflux not as bad as it was PTA.  Has not had sxs in > 36 hours.  Wants to make sure his pain mgt specialist gets copy of the discharge summary and suggestions to minimize narcotics if possible.  Off airborne precautions.  OBJECTIVE:         Vital signs in last 24 hours:    Temp:  [97.6 F (36.4 C)-98.7 F (37.1 C)] 97.6 F (36.4 C) (02/25 0611) Pulse Rate:  [79-88] 80 (02/25 0611) Resp:  [15-22] 18 (02/25 0611) BP: (111-152)/(42-94) 150/74 mmHg (02/25 0611) SpO2:  [89 %-99 %] 99 % (02/25 0611) FiO2 (%):  [91 %] 91 % (02/24 2009) Last BM Date: 10/18/12 General: looks well.  NAD   Heart: RRR Chest: clear B.  No dyspnea or cough Abdomen: soft, obese, NT, ND.  No mass or HSM  Extremities: no CCE.  Neuro/Psych:  Pleasant, no confusion.  Very alert and relaxed.   Intake/Output from previous day: 02/24 0701 - 02/25 0700 In: 1200 [P.O.:1200] Out: -   Intake/Output this shift:    Lab Results:  Recent Labs  10/18/12 0509 10/19/12 0700 10/20/12 0600  WBC 21.4* 17.8* 9.7  HGB 13.4 13.1 12.8*  HCT 37.8* 37.0* 36.8*  PLT 180 199 204   BMET  Recent Labs  10/18/12 0509 10/19/12 0700 10/20/12 0600  NA 138 144 141  K 3.4* 3.3* 3.4*  CL 105 109 105  CO2 22 25 24   GLUCOSE 192* 123* 156*  BUN 19 20 24*  CREATININE 0.61 0.60 0.61  CALCIUM 8.9 9.4 9.0   LFT  Recent Labs  10/17/12 1100 10/18/12 0509  PROT 7.3 6.3  ALBUMIN 3.6 2.8*  AST 28 81*  ALT 39 78*  ALKPHOS 90 61  BILITOT 1.0 0.9   PT/INR No results found for this basename: LABPROT, INR,  in the last 72 hours Hepatitis Panel No results found for this basename: HEPBSAG, HCVAB, HEPAIGM, HEPBIGM,  in the last 72 hours  Studies/Results: Nm Gastric Emptying 10/20/2012  *RADIOLOGY REPORT*   Nuclear medicine gastric emptying study  History:  Diabetes mellitus; gastroesophageal reflux  Views:  LAO stomach  Radionuclide:  Technetium 99  sulfur colloid in egg  Dose:  2.0 mCi  Route of administration:  Oral  Findings:  The patient consumed radiotracer in solid material orally with images and counts statistics obtained at 30-minute time intervals for a total of 120 minutes.  After 60 minutes, 11% of solid material had emptied from the stomach, an abnormal value.  After 120 minutes, essentially no solid material had emptied from the stomach.  Conclusion:  There is evidence suggesting gastroesophageal reflux between 60 and 120 minutes post radiotracer administration. There is  marked diminution in the gastric emptying of solid material. 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 An anatomic lesion causing gastric outlet obstruction cannot be excluded on this study, however.   Original Report Authenticated By: Bretta Bang, M.D.    Dg Chest Port 1 View 10/19/2012   IMPRESSION: Findings most suggestive of pulmonary edema superimposed on severe emphysematous change.   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  Barrett's esophagus and gastroesophageal reflux noted during EGD of 2/24.    On BID po Protonix.  *  Gastroparesis. 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  *  Aspiration vs CAP bilateral PNA.  Now on po Cipro, Zosyn & Zithromax previously.  *  O2 dependent emphysema/COPD *  Steroid and MTX dependent rheum arthritis.  *  Narcotics dep MS pain.  *  Peripheral polyneuropathy.  This may be causative in the gastroparesis.  *  Osteoporosis, weekly Fosamax needs to be discontinued *  Fatty liver with mild transaminase elevation.    PLAN: *  Add HS po Reglan, as his reflux is worst at night and he is a bit fearful of the potential CNS s/e of the drug given his hx of neuropathy. *  Continue BID po Protonix.  *  Esophagram tomorrow.  Ok to  eat solids today.  *  Consider IV Bisphosphonate such as IV Boniva to replace the oral Fosamax.  Probably best to let rheumatologist make this decision.     LOS: 3 days   Jennye Moccasin  10/20/2012, 10:59 AM  I have reviewed the above note, examined the patient and agree with plan of treatment.Based on results of the GES he would not be a candidate for Nissen fundoplication unless we can reverse the gastroparesis by minimizing the narcotics. I agree with Reglan or Domperidone , if Reglan not tolerated. Will go ahead with a barium study tomorrow. Discussed strict antireflux measures. With the pt and his wife.  [facility] Pager # [phone]  Pager: [phone]

## 2012-10-20 NOTE — Progress Notes (Signed)
PULMONARY  / CRITICAL CARE MEDICINE  Name: Joseph [name] MRN: [id number] DOB: [birth year]    ADMISSION DATE:  10/17/2012   CHIEF COMPLAINT:  Dyspnea and hypoxemia  BRIEF PATIENT DESCRIPTION: 62 year old white male with severe chronic obstructive lung disease admitted with bilateral lower lobe pneumonia question of aspiration versus community-acquired.   The patient noted the subacute onset of dyspnea cough and congestion over the last 24 hours. The patient has a baseline need of oxygen 3 L. Patient has severe COPD and is on chronic inhaled Advair and Spiriva at home. Patient was brought to the emergency room and transported to the ICU for further inpatient care on 10/17/2012.  SIGNIFICANT EVENTS / STUDIES:  10/17/12 CT scan of chest: Revealed no pulmonary embolism by basilar infiltrates and severe bullous emphysema 2/23- [place] O2 , no distress 2/24 EGD>>no stricture, barretts esophagitis  LINES / TUBES: PIV  CULTURES: Blood cultures x2  10/17/12>>>NEG Respiratory culture 10/17/12>>>enterobacter S to cipro  ANTIBIOTICS: Zosyn 10/17/2012(enterobacter PNA)>>2/25 Vancomycin 10/17/2012>>2/23 cipro po 2/25 (enterobacter asp pna)>>  SUBJECTIVE:  PT seen in endoscopy  VITAL SIGNS: Temp:  [97.6 F (36.4 C)-98.7 F (37.1 C)] 97.6 F (36.4 C) (02/25 0611) Pulse Rate:  [79-88] 80 (02/25 0611) Resp:  [15-22] 18 (02/25 0611) BP: (111-152)/(42-94) 150/74 mmHg (02/25 0611) SpO2:  [89 %-99 %] 99 % (02/25 0611) FiO2 (%):  [91 %] 91 % (02/24 2009) HEMODYNAMICS: Hemodynamically stable    Vent Mode:  [-]  FiO2 (%):  [91 %] 91 % INTAKE / OUTPUT: Intake/Output     02/24 0701 - 02/25 0700 02/25 0701 - 02/26 0700   P.O. 1200    I.V. (mL/kg)     IV Piggyback     Total Intake(mL/kg) 1200 (10.6)    Emesis/NG output     Total Output       Net +1200          Urine Occurrence 4 x      PHYSICAL EXAMINATION: General:  No acute distress Neuro:  No neurologic deficits HEENT:  Dry mucous  membranes Cardiovascular:  Regular rate and rhythm normal S1-S2 no murmur or gallop Lungs:  CTA , reduced bases Abdomen:  Soft nontender no organomegaly Musculoskeletal:  Chronic rheumatoid arthritis changes of both upper and lower extremities Skin:  Clear  LABS:  Recent Labs Lab 10/17/12 1100 10/17/12 1123 10/17/12 1733  10/18/12 0509 10/19/12 0700 10/20/12 0600  HGB 15.7  --   --   < > 13.4 13.1 12.8*  WBC 6.0  --   --   < > 21.4* 17.8* 9.7  PLT 195  --   --   < > 180 199 204  NA 140  --   --   --  138 144 141  K 3.1*  --   --   --  3.4* 3.3* 3.4*  CL 102  --   --   --  105 109 105  CO2 24  --   --   --  22 25 24   GLUCOSE 134*  --   --   --  192* 123* 156*  BUN 23  --   --   --  19 20 24*  CREATININE 0.80  --   --   < > 0.61 0.60 0.61  CALCIUM 9.6  --   --   --  8.9 9.4 9.0  AST 28  --   --   --  81*  --   --   ALT  39  --   --   --  78*  --   --   ALKPHOS 90  --   --   --  61  --   --   BILITOT 1.0  --   --   --  0.9  --   --   PROT 7.3  --   --   --  6.3  --   --   ALBUMIN 3.6  --   --   --  2.8*  --   --   LATICACIDVEN 3.5*  --   --   --   --   --   --   TROPONINI <0.30  --   --   --   --   --   --   PROCALCITON  --   --  26.14  --  19.16 9.55  --   PROBNP 107.6  --   --   --   --   --   --   PHART  --  7.457*  --   --   --   --   --   PCO2ART  --  29.0*  --   --   --   --   --   PO2ART  --  43.0*  --   --   --   --   --   < > = values in this interval not displayed.  Recent Labs Lab 10/19/12 0732 10/19/12 1057 10/19/12 1603 10/19/12 2312 10/20/12 0643  GLUCAP 114* 155* 136* 135* 149*    CXR: no film  ASSESSMENT / PLAN:  PULMONARY A: Acute on chronic hypoxic respiratory failure due to Copd,fibrosis, aspiration pneumonia d/t GERD/reflux d/t enterobacter P:   Cont spiriva/dulera Oxygen (has at home)  Flutter valve and IS  CARDIOVASCULAR A: No acute issues, R. history of coronary artery disease with bypass surgery 2006 P:   Heparin subcutaneous for  DVT prophylaxis  RENAL A:  Hypo k  P:   Replete K   GASTROINTESTINAL A:  History of high level reflux disease, delayed gastric emptying contributes to GERD P:   Change to BID PPI Esophagram 2/26.   Doubt can have nissen fundoplication d/t gastric emptying issues  HEMATOLOGIC A:  No acute hematologic issues P:  Sub q hep  INFECTIOUS A:  Bibasilar pneumonia due to enterobacter due to aspiration , immunosuppressed S to cipro P:   Change to PO cipro  ENDOCRINE A:  History of diabetes and likely had be hyperglycemic with steroids  P:   SSI  NEUROLOGIC A:  No acute issues P:   Monitor  Caryl Bis  [phone]  Cell  [phone]  If no response or cell goes to voicemail, call beeper [phone]

## 2012-10-20 NOTE — Progress Notes (Signed)
Pt has been NPO since midnight for gastroparesis study scheduled for today. No orders/notes in related to procedure, but per RN report this is to be this AM. Also held MS Contin at 2200, per RN report to do so as instructed by MD. Pt has not been having any pain besides occasionally when coughing therefore he stated he did not need the MS Contin anyway. Will continue to monitor.

## 2012-10-21 ENCOUNTER — Encounter: Payer: Self-pay | Admitting: Internal Medicine

## 2012-10-21 ENCOUNTER — Inpatient Hospital Stay (HOSPITAL_COMMUNITY): Payer: BC Managed Care – PPO

## 2012-10-21 LAB — BASIC METABOLIC PANEL
Calcium: 9 mg/dL (ref 8.4–10.5)
Creatinine, Ser: 0.64 mg/dL (ref 0.50–1.35)
GFR calc Af Amer: >90 mL/min (ref >90)
GFR calc non Af Amer: >90 mL/min (ref >90)
Sodium: 142 mEq/L (ref 135–145)

## 2012-10-21 LAB — CBC WITH DIFFERENTIAL/PLATELET
Basophils Absolute: 0 10*3/uL (ref 0.0–0.1)
Eosinophils Relative: 0 % (ref 0–5)
Lymphocytes Relative: 7 % — ABNORMAL LOW (ref 12–46)
MCV: 90.8 fL (ref 78.0–100.0)
Neutrophils Relative %: 85 % — ABNORMAL HIGH (ref 43–77)
Platelets: 207 10*3/uL (ref 150–400)
RDW: 15.1 % (ref 11.5–15.5)
WBC: 12.1 10*3/uL — ABNORMAL HIGH (ref 4.0–10.5)

## 2012-10-21 LAB — GLUCOSE, CAPILLARY: Glucose-Capillary: 127 mg/dL — ABNORMAL HIGH (ref 70–99)

## 2012-10-21 MED ORDER — CIPROFLOXACIN HCL 750 MG PO TABS: 750.0000 mg | ORAL_TABLET | Freq: Two times a day (BID) | ORAL | Status: AC

## 2012-10-21 MED ORDER — PREDNISONE (PAK) 10 MG PO TABS: ORAL_TABLET | Freq: Every day | ORAL | Status: DC

## 2012-10-21 MED ORDER — PREDNISONE 10 MG PO TABS: ORAL_TABLET | ORAL | Status: DC

## 2012-10-21 MED ORDER — METHOTREXATE (ANTI-RHEUMATIC) 2.5 MG PO TABS: 7.5000 mg | ORAL_TABLET | ORAL | Status: DC

## 2012-10-21 MED ORDER — MORPHINE SULFATE ER 30 MG PO TBCR: 30.0000 mg | EXTENDED_RELEASE_TABLET | Freq: Two times a day (BID) | ORAL | Status: DC

## 2012-10-21 MED ORDER — POTASSIUM CHLORIDE CRYS ER 20 MEQ PO TBCR
40.0000 meq | EXTENDED_RELEASE_TABLET | Freq: Once | ORAL | Status: AC
  Administered 2012-10-21: 40 meq via ORAL
  Filled 2012-10-21: qty 2

## 2012-10-21 MED ORDER — PANTOPRAZOLE SODIUM 40 MG PO TBEC: 40.0000 mg | DELAYED_RELEASE_TABLET | Freq: Two times a day (BID) | ORAL | Status: DC

## 2012-10-21 MED ORDER — METOCLOPRAMIDE HCL 10 MG PO TABS: 10.0000 mg | ORAL_TABLET | Freq: Every day | ORAL | Status: DC

## 2012-10-21 NOTE — Plan of Care (Signed)
Problem: Food- and Nutrition-Related Knowledge Deficit (NB-1.1) Goal: Nutrition education Formal process to instruct or train a patient/client in a skill or to impart knowledge to help patients/clients voluntarily manage or modify food choices and eating behavior to maintain or improve health. Outcome: Completed/Met Date Met:  10/21/12 Spoke with pt about diabetic diet, small frequent meals and low fiber and low fat diet to aid in controlling gastroparesis. Expect very good compliance.

## 2012-10-21 NOTE — Progress Notes (Signed)
     [facility] Daily Rounding Note 10/21/2012, 10:32 AM  SUBJECTIVE:       No relux.  Feels well  OBJECTIVE:         Vital signs in last 24 hours:    Temp:  [97.5 F (36.4 C)-98.1 F (36.7 C)] 97.7 F (36.5 C) (02/26 0730) Pulse Rate:  [59-69] 64 (02/26 0730) Resp:  [18-20] 18 (02/26 0730) BP: (138-154)/(64-84) 148/64 mmHg (02/26 0730) SpO2:  [90 %-96 %] 96 % (02/26 0850) Last BM Date: 10/21/12 General: looks well.    Heart: RRR Chest: clear B Abdomen: soft , NT, ND.  Active BS Extremities: some non-pitting pedal edema Neuro/Psych:  Pleasant, no confusion or agitation.   Intake/Output from previous day: 02/25 0701 - 02/26 0700 In: 240 [P.O.:240] Out: 1600 [Urine:1600]  Intake/Output this shift:    Lab Results:  Recent Labs  10/19/12 0700 10/20/12 0600 10/21/12 0625  WBC 17.8* 9.7 12.1*  HGB 13.1 12.8* 13.5  HCT 37.0* 36.8* 38.7*  PLT 199 204 207   BMET  Recent Labs  10/19/12 0700 10/20/12 0600 10/21/12 0625  NA 144 141 142  K 3.3* 3.4* 3.2*  CL 109 105 108  CO2 25 24 22   GLUCOSE 123* 156* 132*  BUN 20 24* 26*  CREATININE 0.60 0.61 0.64  CALCIUM 9.4 9.0 9.0    Studies/Results: Esophagram completed Awaiting results.  Nm Gastric Emptying 10/20/2012    Conclusion:  There is evidence suggesting gastroesophageal reflux between 60 and 120 minutes post radiotracer administration. There is  marked diminution in the gastric emptying of solid material. 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 An anatomic lesion causing gastric outlet obstruction cannot be excluded on this study, however.   Original Report Authenticated By: Bretta Bang, M.D.    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 Barrett's esophagus and gastroesophageal reflux noted during EGD of 2/24.  On BID po Protonix.  * Gastroparesis. 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  Reglan at HS added 2/25.  * Aspiration vs CAP bilateral PNA. Now on po Cipro.  * O2 dependent emphysema/COPD  * Steroid and MTX dependent rheum arthritis.  * Narcotics dep MS pain.  * Peripheral polyneuropathy. This may be causative in the gastroparesis.  * Osteoporosis, weekly Fosamax discontinued  * Fatty liver with mild transaminase elevation.    PLAN: *  Agree with D/c home today.  Has GI ROV with Dr  Juanda Chance set for April 4th.   * will follow up the esophagram.  *  Eat smaller, more frequent meals, avoid meals close to bedtime.   Will ask for nutrition consult as pt's wife is requesting.    LOS: 4 days   Jennye Moccasin  10/21/2012, 10:32 AM Pager: [phone]

## 2012-10-21 NOTE — Progress Notes (Signed)
Nutrition Education Note   Talked with pt and wife at length about carbohydrate sources, carb counting, meal planning as it relates to combining a diabetic diet and heart-healthy diet. Pt with hx of GERD and gastroparesis. Advised pt on limiting spicy foods, eating small, frequent meals, and limiting fat and fiber in the diet. Pt interested in carbohydrate sources and how to incorporate protein and appropriate carbohydrates in each meal, discussed this with patient and wife. Wife took many notes throughout discussion.   Pt and wife were very eager and therefore expect great compliance. Requested more information about [facility] and RD provided them with this information for follow-up.  Pt will require physician referral for this.   Lab Results  Component Value Date   HGBA1C 6.1 05/08/2012   Provided pt with handout- "Gastroparesis Nutrition Therapy" and "Carbohydrate Counting for Diabetes."  RD contact information provided.  Teach back method used.  Pt d/c'ed earlier this afternoon.  Trenton Gammon Dietetic Intern # [phone]  Loyce Dys, MS RD LDN Clinical Inpatient Dietitian Pager: [phone] Weekend/After hours pager: [phone]

## 2012-10-21 NOTE — Progress Notes (Addendum)
PULMONARY  / CRITICAL CARE MEDICINE  Name: Joseph [name] MRN: [id number] DOB: 07-Jul-1951    ADMISSION DATE:  10/17/2012   CHIEF COMPLAINT:  Dyspnea and hypoxemia  BRIEF PATIENT DESCRIPTION: 62 year old white male with severe chronic obstructive lung disease admitted with bilateral lower lobe pneumonia question of aspiration versus community-acquired.   The patient noted the subacute onset of dyspnea cough and congestion over the last 24 hours. The patient has a baseline need of oxygen 3 L. Patient has severe COPD and is on chronic inhaled Advair and Spiriva at home. Patient was brought to the emergency room and transported to the ICU for further inpatient care on 10/17/2012.  SIGNIFICANT EVENTS / STUDIES:  10/17/12 CT scan of chest: Revealed no pulmonary embolism by basilar infiltrates and severe bullous emphysema 2/23- [place] O2 , no distress 2/24 EGD>>no stricture, barretts esophagitis 2/25 gastric empty: abnormal emptying  LINES / TUBES: PIV  CULTURES: Blood cultures x2  10/17/12>>>NEG Respiratory culture 10/17/12>>>enterobacter S to cipro  ANTIBIOTICS: Zosyn 10/17/2012(enterobacter PNA)>>2/25 Vancomycin 10/17/2012>>2/23 cipro po 2/25 (enterobacter asp pna)>>Stop day 10/28/12  SUBJECTIVE:  Pt improved.  Less dyspneic VITAL SIGNS: Temp:  [97.5 F (36.4 C)-98.1 F (36.7 C)] 97.7 F (36.5 C) (02/26 0730) Pulse Rate:  [59-69] 64 (02/26 0730) Resp:  [18-20] 18 (02/26 0730) BP: (138-154)/(64-84) 148/64 mmHg (02/26 0730) SpO2:  [90 %-96 %] 96 % (02/26 0850) HEMODYNAMICS: Hemodynamically stable      INTAKE / OUTPUT: Intake/Output     02/25 0701 - 02/26 0700 02/26 0701 - 02/27 0700   P.O. 240    Total Intake(mL/kg) 240 (2.1)    Urine (mL/kg/hr) 1600 (0.6)    Total Output 1600     Net -1360            PHYSICAL EXAMINATION: General:  No acute distress Neuro:  No neurologic deficits HEENT:  Dry mucous membranes Cardiovascular:  Regular rate and rhythm normal S1-S2 no murmur  or gallop Lungs:  CTA , reduced bases Abdomen:  Soft nontender no organomegaly Musculoskeletal:  Chronic rheumatoid arthritis changes of both upper and lower extremities Skin:  Clear  LABS:  Recent Labs Lab 10/17/12 1100 10/17/12 1123 10/17/12 1733  10/18/12 0509 10/19/12 0700 10/20/12 0600 10/21/12 0625  HGB 15.7  --   --   < > 13.4 13.1 12.8* 13.5  WBC 6.0  --   --   < > 21.4* 17.8* 9.7 12.1*  PLT 195  --   --   < > 180 199 204 207  NA 140  --   --   --  138 144 141 142  K 3.1*  --   --   --  3.4* 3.3* 3.4* 3.2*  CL 102  --   --   --  105 109 105 108  CO2 24  --   --   --  22 25 24 22   GLUCOSE 134*  --   --   --  192* 123* 156* 132*  BUN 23  --   --   --  19 20 24* 26*  CREATININE 0.80  --   --   < > 0.61 0.60 0.61 0.64  CALCIUM 9.6  --   --   --  8.9 9.4 9.0 9.0  AST 28  --   --   --  81*  --   --   --   ALT 39  --   --   --  78*  --   --   --  ALKPHOS 90  --   --   --  61  --   --   --   BILITOT 1.0  --   --   --  0.9  --   --   --   PROT 7.3  --   --   --  6.3  --   --   --   ALBUMIN 3.6  --   --   --  2.8*  --   --   --   LATICACIDVEN 3.5*  --   --   --   --   --   --   --   TROPONINI <0.30  --   --   --   --   --   --   --   PROCALCITON  --   --  26.14  --  19.16 9.55  --   --   PROBNP 107.6  --   --   --   --   --   --   --   PHART  --  7.457*  --   --   --   --   --   --   PCO2ART  --  29.0*  --   --   --   --   --   --   PO2ART  --  43.0*  --   --   --   --   --   --   < > = values in this interval not displayed.  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 history of coronary artery disease  with bypass surgery 2006 P:   No need for dvt proph RENAL A:  Hypo k  P:   Replete K further  GASTROINTESTINAL A:  History of high level reflux disease, delayed gastric emptying contributes to GERD P:   Change to BID PPI Esophagram 2/26.  Today HS reglan Doubt can have nissen fundoplication d/t gastric emptying issues  HEMATOLOGIC A:  No acute hematologic issues P:  No need for DVT proph INFECTIOUS A:  Bibasilar pneumonia due to enterobacter due to aspiration , immunosuppressed S to cipro P:    PO cipro until 10/28/12  ENDOCRINE A:  History of diabetes and likely had be hyperglycemic with steroids  P:   SSI  NEUROLOGIC A:  No acute issues P:   Monitor  Plan DC to home this PM .   Send out on HS Reglan,  cipro 750 bid x 7days.  Reduce MSContin 30mg  bid.   Needs appt with TP in 7days with med rec.  Pt desires change to another Pulm MD from [place]. Pt needs pain clinic appt soon HOLD Fosamax>>>need to change to reclast or boniva IV>>needs rheum f/u for this soon to get off fosamax Hold methotrexate x 7 more days then resume Change omeprazole to protonix 40mg  BID AC  Taper pred to 6mg /d as before Spiriva/advair as before Oxygen as before   Caryl Bis  [phone]  Cell  [phone]  If no response or cell goes to voicemail, call beeper [phone]

## 2012-10-21 NOTE — Discharge Summary (Signed)
Physician Discharge Summary  Patient ID: Joseph [name] MRN: [id number] DOB/AGE: [birth year] 62 y.o.  Admit date: 10/17/2012 Discharge date: 10/21/2012    Discharge Diagnoses:  Acute on Chronic Hypoxic Respiratory Failure COPD  Pulmonary Fibrosis Aspiration PNA Enterobacter cloacae pneumonia CAD HTN DM GERD Barrett's Esophagus Gastroparesis Rheumatoid arthritis Steroid Dependence    Brief Summary: Joseph [name] is a 62 y.o. y/o male with a PMH of  RA on prednisone and methotrexate, O2 dependent (3L) severe chronic obstructive lung disease admitted with bilateral lower lobe pneumonia question of aspiration versus community-acquired. The patient noted the subacute onset of dyspnea cough and congestion over 24 hours prior to admit.  He was found to be severely hypoxic in ER and required high flow oxygen.  Admitted to ICU for close observation.  CT of chest demonstrated no PE but basilar infiltrates and severe bullous emphysema.  Given concerns for aspiration, he was evaluated by GI and underwent EGD on 2/24 with no stricture, barretts esophagitis noted. His GERD regimen was adjusted (see medications below).  2/25 he had an abnormal gastric emptying study, gastroparesis in setting of narcotics & peripheral polyneuropathy.  Reglan added to nocturnal regimen.  Respiratory cultures grew enterobacter which was sensitive to cipro.   Home narcotic regimen was reduced and patient tolerated well.      Significant Diagnostic Studies:  2/22 -  CT scan of chest: Revealed no pulmonary embolism by basilar infiltrates and severe bullous emphysema  2/23 - [place] O2 , no distress  2/24 - EGD>>no stricture, barretts esophagitis  2/25 - gastric empty: abnormal emptying  CULTURES:  Blood cultures x2 10/17/12>>>NEG  Respiratory culture 10/17/12>>>enterobacter S to cipro   ANTIBIOTICS:  Zosyn 10/17/2012 (enterobacter PNA)>>2/25  Vancomycin 10/17/2012>>2/23  cipro po 2/25 (enterobacter asp pna)>>Stop day  10/28/12                                                                    Hospital Summary by Discharge Diagnosis  Acute on Chronic Hypoxic Respiratory Failure COPD  Pulmonary Fibrosis Aspiration PNA Enterobacter cloacae pneumonia Acute on chronic respiratory failure in setting of severe bullous empysema, O2 dependent COPD.  Aspiration PNA. Respiratory cultures positive for enterobacter (Sens to Cipro).    Discharge Plan: -continue home oxygen as previously arranged -continue spiriva, advair -follow up in pulmonary office as below, pt wants to be set up with new Pulmonary MD -continue flutter valve / IS at home -PO Cipro until 10/28/12 -prednisone taper back to 6 mg / day (previous baseline dose for RA)   CAD HTN No acute changes made during this admission.  Discharge Plan: -continue previous home regimen  DM  Discharge Plan: -continue previous home regimen  GERD Barrett's Esophagus Gastroparesis Given concerns for aspiration, he was evaluated by GI and underwent EGD on 2/24 with no stricture, barretts esophagitis noted. His GERD regimen was adjusted (see medications below).  2/25 he had an abnormal gastric emptying study, gastroparesis in setting of narcotics.  Reglan added to nocturnal regimen.  He further was evaluated with esophagram 2/26.   Discharge Plan: -HS Reglan -change omeprazole to protonix 40 BID AC -Esophagram follow up per GI   Rheumatoid arthritis Steroid Dependence Chronic Pain Chronic pain and steroid dependence in setting of RA.  Narcotics  thought to be contributing to GI symptoms.    Discharge Plan: -hold methotrexate for one more week then resume -hold fosamax >>will need to change to reclast or boniva IV  -follow up with Rhe[facility]ology at [facility] - Dr. Olean Ree  -follow up in pain clinic with Dr. Manon Hilding as below -MS Contin reduced to 30 mg BID  Discharge Exam: General: No acute distress  Neuro: No neurologic deficits  HEENT: Dry mucous  membranes  Cardiovascular: Regular rate and rhythm normal S1-S2 no murmur or gallop  Lungs: CTA , reduced bases  Abdomen: Soft nontender no organomegaly  Musculoskeletal: Chronic rheumatoid arthritis changes of both upper and lower extremities  Skin: Clear   Filed Vitals:   10/21/12 0332 10/21/12 0730 10/21/12 0850 10/21/12 1112  BP: 138/68 148/64  143/79  Pulse: 59 64  62  Temp: 97.9 F (36.6 C) 97.7 F (36.5 C)    TempSrc: Oral Oral    Resp: 18 18    Height:      Weight:      SpO2: 96% 94% 96%      Discharge Labs  BMET  Recent Labs Lab 10/17/12 1100 10/17/12 1734 10/18/12 0509 10/19/12 0700 10/20/12 0600 10/21/12 0625  NA 140  --  138 144 141 142  K 3.1*  --  3.4* 3.3* 3.4* 3.2*  CL 102  --  105 109 105 108  CO2 24  --  22 25 24 22   GLUCOSE 134*  --  192* 123* 156* 132*  BUN 23  --  19 20 24* 26*  CREATININE 0.80 0.81 0.61 0.60 0.61 0.64  CALCIUM 9.6  --  8.9 9.4 9.0 9.0   CBC  Recent Labs Lab 10/19/12 0700 10/20/12 0600 10/21/12 0625  HGB 13.1 12.8* 13.5  HCT 37.0* 36.8* 38.7*  WBC 17.8* 9.7 12.1*  PLT 199 204 207   Discharge Orders   Future Appointments Provider Department Dept Phone   10/27/2012 11:45 AM Tammy Rogers Seeds, NP [facility] [phone]   11/05/2012 10:00 AM Wanda Plump, MD [facility]  [place] [phone]   11/27/2012 3:45 PM Hart Carwin, MD [facility] [phone]   02/15/2013 10:45 AM Waymon Budge, MD [facility] [phone]   Future Orders Complete By Expires     Call MD for:  difficulty breathing, headache or visual disturbances  As directed     Call MD for:  extreme fatigue  As directed     Call MD for:  severe uncontrolled pain  As directed     Call MD for:  temperature >100.4  As directed     Diet - low sodium heart healthy  As directed     Discharge instructions  As directed     Comments:      -Prednisone taper back to 6mg  daily -review your medications  carefully -MS Contin has been decreased to BID -follow up appointments as above -Eat small, frequent meals. NO meals 2-3 hours prior to bedtime.  Avoid spicy foods, chocolate, alcohol, fried foods, tomato based foods.    Increase activity slowly  As directed           Follow-up Information   Follow up with PARRETT,TAMMY, NP On 10/27/2012. (Appt at 11:45)    Contact information:   [facility] [address] [place] [address] [phone]       Follow up with Willow Ora, MD On 11/05/2012. (Appt at 10:00)    Contact information:  [address] [facility] [address] [place] [address] [phone]       Follow up with [name], MD On 11/27/2012. (3:45 PM for reflux follow up)    Contact information:   [address] [place] [address] [phone]       Follow up with Verdon Cummins, MD On 10/22/2012. (Appt at 1 PM)    Contact information:   [address] [place] [address] [phone]       Follow up with Lillia Pauls, MD. (As scheduled in June)    Contact information:   [address] [place] [address] [phone]        Medication List    STOP taking these medications       alendronate 70 MG tablet  Commonly known as:  FOSAMAX     omeprazole 20 MG capsule  Commonly known as:  PRILOSEC      TAKE these medications       albuterol 90 MCG/ACT inhaler  Commonly known as:  PROVENTIL,VENTOLIN  Inhale 2 puffs into the lungs every 4 (four) hours as needed for wheezing or shortness of breath.     amitriptyline 75 MG tablet  Commonly known as:  ELAVIL  Take 100 mg by mouth 3 (three) times daily.     aspirin 81 MG tablet  Take 81 mg by mouth daily.     atorvastatin 80 MG tablet  Commonly known as:  LIPITOR  Take 1 tablet (80 mg total) by mouth daily.     ciprofloxacin 750 MG tablet  Commonly known as:  CIPRO  Take 1 tablet (750 mg total) by mouth 2 (two) times daily.     [facility]  CALAMINE lotion  Generic drug:  calamine     Fluticasone-Salmeterol 100-50 MCG/DOSE Aepb  Commonly known as:  ADVAIR DISKUS  1 puff then rinse mouth, twice daily     Flutter Devi  Blow through several times, 2 or 3 sets per day as needed     folic acid 400 MCG tablet  Commonly known as:  FOLVITE  Take 800 mcg by mouth daily.     furosemide 20 MG tablet  Commonly known as:  LASIX  Take 60 mg by mouth daily. 2 in the morning and 1 in the afternoon     methotrexate 2.5 MG tablet  Commonly known as:  RHEUMATREX  Take 3 tablets (7.5 mg total) by mouth once a week. Take 8 tablets every week  Start taking on:  10/29/2012     metoCLOPramide 10 MG tablet  Commonly known as:  REGLAN  Take 1 tablet (10 mg total) by mouth at bedtime.     metoprolol tartrate 25 MG tablet  Commonly known as:  LOPRESSOR  Take 1 tablet (25 mg total) by mouth 2 (two) times daily.     morphine 30 MG 12 hr tablet  Commonly known as:  MS CONTIN  Take 1 tablet (30 mg total) by mouth 2 (two) times daily.     nitroGLYCERIN 0.4 MG SL tablet  Commonly known as:  NITROSTAT  Place 1 tablet (0.4 mg total) under the tongue every 5 (five) minutes as needed for chest pain.     NON FORMULARY  oxygen 3L per [facility] 24 hours a day     ONE TOUCH ULTRA TEST test strip  Generic drug:  glucose blood  USE AS DIRECTED     onetouch ultrasoft lancets  Use as instructed     oxyCODONE-acetaminophen 7.5-325 MG per tablet  Commonly known as:  PERCOCET  Take 1 tablet by mouth. 3 times a day     pantoprazole 40 MG tablet  Commonly known as:  PROTONIX  Take 1 tablet (40 mg total) by mouth 2 (two) times daily.     PERFECT IRON PO  Take by mouth. 1 tab daily     potassium chloride SA 20 MEQ tablet  Commonly known as:  K-DUR,KLOR-CON  Take 1 tablet (20 mEq total) by mouth 2 (two) times daily. 2 by mouth in the morning and 1 by mouth at 4 pm     predniSONE 10 MG tablet  Commonly known as:  DELTASONE  4 tabs daily for 3 days,  then 2 tabs daily for 3 days, then 1 tabs daily for 3 days, then return to 6 mg daily as previously prescribed     predniSONE 10 MG tablet  Commonly known as:  STERAPRED UNI-PAK  Take by mouth daily. TAKES 6 MG AM .  Resume at end of prednisone taper (after 3 days of 10 mg daily)     pregabalin 150 MG capsule  Commonly known as:  LYRICA  Take 150 mg by mouth 3 (three) times daily.     tiotropium 18 MCG inhalation capsule  Commonly known as:  SPIRIVA HANDIHALER  Place 1 capsule (18 mcg total) into inhaler and inhale daily. 1 puff in AM     UNABLE TO FIND  Med Name: Incentive Spirometer  Use as directed     vitamin B-12 250 MCG tablet  Commonly known as:  CYANOCOBALAMIN  Take 250 mcg by mouth daily.     vitamin C 500 MG tablet  Commonly known as:  ASCORBIC ACID  Take 500 mg by mouth daily.          Disposition:   Discharged Condition: Joseph [name] has met maximum benefit of inpatient care and is medically stable and cleared for discharge.  Patient is pending follow up as above.      Time spent on disposition:  Greater than 35 minutes.   Signed: Canary Brim, NP-C [facility] Pgr: [phone]   I have seen and examined this pt and agree with the above d/c summary  Caryl Bis  [phone]  Cell  [phone]  If no response or cell goes to voicemail, call beeper [phone]

## 2012-10-23 LAB — CULTURE, BLOOD (ROUTINE X 2): Culture: NO GROWTH

## 2012-10-26 ENCOUNTER — Encounter: Payer: Self-pay | Admitting: *Deleted

## 2012-10-27 ENCOUNTER — Telehealth: Payer: Self-pay | Admitting: Critical Care Medicine

## 2012-10-27 ENCOUNTER — Inpatient Hospital Stay: Payer: BC Managed Care – PPO | Admitting: Adult Health

## 2012-10-27 MED ORDER — NYSTATIN 100000 UNIT/ML MT SUSP: 500000.0000 [IU] | Freq: Four times a day (QID) | OROMUCOSAL | Status: DC

## 2012-10-27 NOTE — Telephone Encounter (Signed)
Per CY-okay to refill Nystatin Rx as she had before.

## 2012-10-27 NOTE — Telephone Encounter (Addendum)
Called and spoke with pt and he stated that he has thrush and is wanting something called in for this.  Pt also c/o sore throat.  Pt was given nystatin last time. Pt see's CY and PW.    CY please advise thanks   Last ov--09/14/2012 Next ov--02/15/2013   No Known Allergies

## 2012-10-27 NOTE — Telephone Encounter (Signed)
Per CY-Nystatin 100,000 units/ml #17ml swish and swallow 1 teaspoon QID no refills.

## 2012-10-27 NOTE — Telephone Encounter (Signed)
Nystatin is not on pt's med past med list.  Spoke with Florentina Addison.  Will route msg to her.

## 2012-10-27 NOTE — Telephone Encounter (Signed)
Rx has been sent in per CY, pt is aware.

## 2012-10-27 NOTE — Telephone Encounter (Signed)
Spoke with patient's pharmacy to see how Rx was given last and they do not have any Rx on file for the past 2 years for Nystatin for this patient. Will speak with CY prior to PM office to get sig and qty to give patient.

## 2012-10-29 ENCOUNTER — Telehealth: Payer: Self-pay | Admitting: Internal Medicine

## 2012-10-29 MED ORDER — METOCLOPRAMIDE HCL 10 MG PO TABS: 10.0000 mg | ORAL_TABLET | Freq: Every day | ORAL | Status: DC

## 2012-10-29 NOTE — Telephone Encounter (Signed)
He has had increasing confusion - intermittent. Only medication changes have been metaclopramide at bedtime and reduction in MS Contin. No fever BP was 90 systolic today at MD visit No other sxs, resp status ok  Advised to stop/Hold the metaclopramide Proceed for toe amputation tomorrow (Dr. Melvyn Neth in [place])but let them know what has been happening so they can assess him pre-op - surgery could be cancelled.

## 2012-11-02 ENCOUNTER — Encounter: Payer: Self-pay | Admitting: Adult Health

## 2012-11-02 ENCOUNTER — Ambulatory Visit (INDEPENDENT_AMBULATORY_CARE_PROVIDER_SITE_OTHER)
Admission: RE | Admit: 2012-11-02 | Discharge: 2012-11-02 | Disposition: A | Discharge status: Home / Self Care | Payer: BC Managed Care – PPO | Source: Ambulatory Visit | Attending: Adult Health | Admitting: Adult Health

## 2012-11-02 ENCOUNTER — Ambulatory Visit (INDEPENDENT_AMBULATORY_CARE_PROVIDER_SITE_OTHER): Payer: BC Managed Care – PPO | Admitting: Adult Health

## 2012-11-02 VITALS — BP 136/66 | HR 77 | Temp 98.9°F | Ht 70.0 in | Wt 239.0 lb

## 2012-11-02 DIAGNOSIS — J4489 Other specified chronic obstructive pulmonary disease: Secondary | ICD-10-CM

## 2012-11-02 DIAGNOSIS — J449 Chronic obstructive pulmonary disease, unspecified: Secondary | ICD-10-CM

## 2012-11-02 DIAGNOSIS — J156 Pneumonia due to other aerobic Gram-negative bacteria: Secondary | ICD-10-CM

## 2012-11-02 DIAGNOSIS — J1569 Pneumonia due to other gram-negative bacteria: Secondary | ICD-10-CM

## 2012-11-02 NOTE — Progress Notes (Signed)
Subjective:    Patient ID: Joseph [name], male    DOB: 01-08-1951, 62 y.o.   MRN: [id number]  HPI 04/09/11- 36 yoM former smoker, followed for COPD, complicated by RA, CAD/ ASVD. Psoriasis, DM, peripheral neuropathy    Wife here Last here October 22, 2010  Oxygen 2 L continuous. He says as long as he uses the Flutter and Incentive spirometer twice daily he can keep his chest clear. He says with these he couldn't be happier and the meds work great. Sputum tends to be very thick. No panic dyspnea episodes.  We discussed vaccinations. He gets flu vax and had one pneumovax in 2007. He is interested in getting a booster- discussed.  On prednisone 5 alternating with 6 mg daily. Had chemical stress test ok for foot surgery.   09/16/11- 57 yoM former smoker, followed for COPD, complicated by RA, CAD/ ASVD. Psoriasis, DM, peripheral neuropathy    Wife here He asks for replacement flutter device which has worked well. He has avoided catching the flu. Still fighting wounds on his feet from his rheumatoid arthritis with peripheral neuropathy. He can feel mucus rattling in his chest but if he uses both incentive spirometer and his flutter device every day he can control it.  03/16/12-  60 yoM former smoker, followed for COPD, complicated by RA, CAD/ ASVD. Psoriasis, DM, peripheral neuropathy  Wife here. Pt states increase in phlegm x 2 weeks.using flutter valve . increase sob,wheezing upon activity .  COPD assessment test (CAT) score 12/40. For the past few weeks he has had increased phlegm. Flutter not as effective as usual. Low-grade fever, creamy sputum. Doing better in the last few days and sputum is clear. Uses home O2 at 2-3 L/ [facility].  09/14/12- 61 yoM former smoker, followed for COPD, complicated by RA, CAD/ ASVD. Psoriasis, DM, peripheral neuropathy      Wife here FOLLOWS FOR: pt reports breathing is much better since last visit-- using flutter valve and incentive spirometer to help loosen  mucus-- will need a new DME company (insurance no longer covers w previous one)  O2 concentrator at 2 L continuous portable and standard concentrators. He depends on pulmonary toilet devices to minimize his cough. Works all day to get up some thick green mucus. Chronic antibiotic for infected toes just finished after 2 weeks of Keflex.  11/02/2012 Post Hospital follow up  Admitted 2/20-2/26 for COPD flare with bilateral lower lobe pneumonia question of aspiration versus community-acquired.   found to be severely hypoxic in ER and required high flow oxygen. CT of chest demonstrated no PE but basilar infiltrates and severe bullous emphysema. Given concerns for aspiration, he was evaluated by GI and underwent EGD on 2/24 with no stricture, barretts esophagitis noted. His GERD regimen was adjusted (see medications below). 2/25 he had an abnormal gastric emptying study, gastroparesis in setting of narcotics & peripheral polyneuropathy. Reglan added to nocturnal regimen. Respiratory cultures grew enterobacter which was sensitive to cipro. Home narcotic regimen was reduced    Since discharge he is feeling better.  Breathing has improved since leaving the hospital.  CXR today shows Stable COPD/chronic changes. No active disease. Has finished abx.  Was unable to tolerate Reglan due to sedation  Has changed diet to 5 small meals a day.  On aggressive GERD diet , he does feel this helps quite a bit.  Currently on prednisone 6mg  daily       Review of Systems- see HPI Constitutional:   No  weight  loss, night sweats,  Fevers, chills,  +fatigue, or  lassitude.  HEENT:   No headaches,  Difficulty swallowing,  Tooth/dental problems, or  Sore throat,                No sneezing, itching, ear ache, nasal congestion, post nasal drip,   CV:  No chest pain,  Orthopnea, PND, swelling in lower extremities, anasarca, dizziness, palpitations, syncope.   GI  No heartburn, indigestion, abdominal pain, nausea,  vomiting, diarrhea, change in bowel habits, loss of appetite, bloody stools.   Resp:    No chest wall deformity  Skin: no rash or lesions.  GU: no dysuria, change in color of urine, no urgency or frequency.  No flank pain, no hematuria     Psych:  No change in mood or affect. No depression or anxiety.  No memory loss.       Objective:   Physical Exam GEN: A/Ox3; pleasant , NAD   HEENT:  [place]/AT,  EACs-clear, TMs-wnl, NOSE-clear, THROAT-clear, no lesions, no postnasal drip or exudate noted.   NECK:  Supple w/ fair ROM; no JVD; normal carotid impulses w/o bruits; no thyromegaly or nodules palpated; no lymphadenopathy.  RESP Diminshed  BS in bases no accessory muscle use, no dullness to percussion  CARD:  RRR, no m/r/g  , tr  peripheral edema, pulses intact, no cyanosis or clubbing.  GI:   Soft & nt; nml bowel sounds; no organomegaly or masses detected.  Musco: Warm bil, no deformities or joint swelling noted.   Neuro: alert, no focal deficits noted.    Skin: Warm, no lesions or rashes

## 2012-11-02 NOTE — Patient Instructions (Addendum)
Continue on current regimen.  Advance activity as tolerated.  Continue on GERD diet .  Follow up Dr. Craige Cotta  In 4 weeks and As needed

## 2012-11-02 NOTE — Assessment & Plan Note (Signed)
Suspected aspiration PNA  cxr improved today  Advised on GERD diet and asp precautions. follow up 4 weeks

## 2012-11-02 NOTE — Addendum Note (Signed)
Addended by: Boone Master E on: 11/02/2012 04:40 PM   Modules accepted: Orders

## 2012-11-02 NOTE — Assessment & Plan Note (Signed)
Recent exacerbation with aspiration PNA  Improving , now finished ABX  Cont on current regimen.  Cont w/ asp precautions. And GERD diet

## 2012-11-03 ENCOUNTER — Telehealth: Payer: Self-pay | Admitting: Adult Health

## 2012-11-03 DIAGNOSIS — J449 Chronic obstructive pulmonary disease, unspecified: Secondary | ICD-10-CM

## 2012-11-03 NOTE — Telephone Encounter (Signed)
Fax to: Attn: Weston Brass @ [facility] [phone]. RX [place] PORTABLE PULSE DOSE O2 CONCENTRATOR / 2ND MACHINE IS: EVERFLO Q CONCENTRATOR. Hazel Sams

## 2012-11-03 NOTE — Telephone Encounter (Signed)
Calling again in ref to previous msg can be reached at [phone].Joseph [name]

## 2012-11-03 NOTE — Progress Notes (Signed)
Reviewed and agree with assessment/plan. 

## 2012-11-03 NOTE — Telephone Encounter (Signed)
Duplicate message. Another message regarding same thing taking earlier. Will sign off this message

## 2012-11-04 ENCOUNTER — Telehealth: Payer: Self-pay | Admitting: Internal Medicine

## 2012-11-04 MED ORDER — GLUCOSE BLOOD VI STRP: ORAL_STRIP | Status: DC

## 2012-11-04 NOTE — Telephone Encounter (Signed)
This will be sent to [facility] as soon as we get 02 sats for 02 qualifing Joseph [name]

## 2012-11-04 NOTE — Telephone Encounter (Signed)
Rx sent 

## 2012-11-04 NOTE — Telephone Encounter (Signed)
PT'S SPOUSE BARBARA [place] CALLED BACK. SHE SAYS SHE UNDERSTOOD FROM OV MONDAY WITH TP THAT TP WOULD ORDER THESE ASAP FOR PT. SPOUSE WANTS TO SPEAK TO A NURSE OR TP ASAP THIS MORNING RE: EXPEDITING THIS FOR PT. [phone]. Hazel Sams

## 2012-11-04 NOTE — Telephone Encounter (Signed)
Refill: Onetouch ultra test strips. Qty 50. Use as directed. Last fill 12-23-11

## 2012-11-04 NOTE — Telephone Encounter (Signed)
Spoke with patients wife; states she needs to have RX for [facility] PORTABLE PULSE DOSE O2 CONCENTRATOR / 2ND MACHINE IS: EVERFLO Q CONCENTRATOR sent to Weston Brass at [phone] at [facility]. Also, would like [place] to look into getting O2 tanks in case of emergency through [facility]. Wife is aware that I am faxing Rx order to [facility]. Will have Libby look at getting tanks through [place]. Will forward to [place] to document as needed.

## 2012-11-05 ENCOUNTER — Encounter: Payer: Self-pay | Admitting: Internal Medicine

## 2012-11-05 ENCOUNTER — Ambulatory Visit (INDEPENDENT_AMBULATORY_CARE_PROVIDER_SITE_OTHER): Payer: BC Managed Care – PPO | Admitting: Internal Medicine

## 2012-11-05 VITALS — BP 110/76 | HR 84 | Temp 98.9°F | Wt 239.0 lb

## 2012-11-05 DIAGNOSIS — E785 Hyperlipidemia, unspecified: Secondary | ICD-10-CM

## 2012-11-05 DIAGNOSIS — E119 Type 2 diabetes mellitus without complications: Secondary | ICD-10-CM

## 2012-11-05 DIAGNOSIS — J156 Pneumonia due to other aerobic Gram-negative bacteria: Secondary | ICD-10-CM

## 2012-11-05 DIAGNOSIS — I10 Essential (primary) hypertension: Secondary | ICD-10-CM

## 2012-11-05 DIAGNOSIS — J1569 Pneumonia due to other gram-negative bacteria: Secondary | ICD-10-CM

## 2012-11-05 DIAGNOSIS — K219 Gastro-esophageal reflux disease without esophagitis: Secondary | ICD-10-CM

## 2012-11-05 LAB — BASIC METABOLIC PANEL
BUN: 11 mg/dL (ref 6–23)
CO2: 24 mEq/L (ref 19–32)
Chloride: 102 mEq/L (ref 96–112)
Creatinine, Ser: 0.8 mg/dL (ref 0.4–1.5)

## 2012-11-05 NOTE — Assessment & Plan Note (Addendum)
Well-controlled, last potassium was slightly low, will check a BMP

## 2012-11-05 NOTE — Progress Notes (Signed)
Subjective:    Patient ID: Joseph [name], male    DOB: 1951/02/16, 62 y.o.   MRN: [id number]  HPI Routine office visit --Admitted last month with bilateral pneumonia, he got quite hypoxic, had a EGD 10/19/2012, diagnosed with Barretts. Also had an abnormal gastric emptying study. Was recommended aggressive GERD treatment to prevent further pneumonias, he is following all their recommendations including avoid late eating, 5 meals a day, good compliance with PPIs. At this point he does not have any heartburn. He still has questions about his diet. See assessment and plan. 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 --Diabetes, ambulatory blood sugar ranged from 93 to 123. Again his diet has improved it is losing weight. --Hypertension, good medication compliance   Past Medical History  Diagnosis Date  . Rheumatoid arthritis   . Psoriasis   . Hypertension   . Gastroesophageal reflux disease   . COPD (chronic obstructive pulmonary disease)   . CAD (coronary artery disease)     MI 01/12/2006, CABG  . Osteoporosis     per DEXA 05-2008  . Peripheral polyneuropathy     Severe, causing problems with his feet, see surgeries  . Diabetes mellitus     Dx 07-2008 A1C 6.2  . Gastritis   . CHF (congestive heart failure)   . Emphysema   . Onychomycosis     toenails  . PONV (postoperative nausea and vomiting)   . Hiatal hernia   . Esophageal dysmotility   . Gastroparesis   . Fatty liver 10/11/09  . Pulmonary fibrosis    Past Surgical History  Procedure Laterality Date  . Foot surgery      to tendon release and repair of osteomyelitis   . Cholecystectomy    . Coronary artery bypass graft  01-12-06    (LIMA to the LAD, left radial to obtuse marginal, SVG to first diagonal, SVG to PDA. His ejection fraction to 50-55%  . Achiles tendon surgery  8/09  . Toe amputation  4-12    d/t a deformity, found to have osteomyelitis, complicated by post-op foot strtess FX  .  Esophagogastroduodenoscopy N/A 10/19/2012    Procedure: ESOPHAGOGASTRODUODENOSCOPY (EGD);  Surgeon: Hart Carwin, MD;  Location[facility] END[facility]COPY;  Service: Endoscopy;  Laterality: N/A;  the dilatation is possible.     Family History: heart disease-- F (2 MIs, died 01/13/12 at age 61),  M DM--no CHF-- M Colon ca-- no prostate ca--no  Social History: Married, No children   Former smoker.  Quit in 01-12-2006.  Smoked for 40 years at 1 ppd ETOH--no disabled-retired   Diet-- healthy Exercise- limited by comorbidities    Review of Systems Crrently without fever chills. No chest pain. Shortness or breath is at baseline. Cough is at baseline as well, no sputum production. No lower extremity edema.     Objective:   Physical Exam BP 110/76  Pulse 84  Temp(Src) 98.9 F (37.2 C) (Oral)  Wt 239 lb (108.41 kg)  BMI 34.29 kg/m2  SpO2 92% General -- alert, well-developed  Lungs --quite decreased breath sounds but clear Heart-- normal rate, regular rhythm, no murmur, and no gallop.   Abdomen--soft, non-tender, no distention, no masses, no HSM, no guarding, and no rigidity.   Extremities-- no pretibial edema bilaterally  Neurologic-- alert & oriented X3 and strength normal in all extremities. Psych-- Cognition and judgment appear intact. Alert and cooperative with normal attention span and concentration.  not anxious appearing and not depressed appearing. Good  spirits.     Assessment & Plan:

## 2012-11-05 NOTE — Patient Instructions (Addendum)
Next visit in 5-6 months, please make an appointment.

## 2012-11-05 NOTE — Assessment & Plan Note (Signed)
Doing great with diet, has lost some weight. Check a hemoglobin A1c.

## 2012-11-05 NOTE — Assessment & Plan Note (Addendum)
During recent admission for pneumonia he underwent GI evaluation, EGD showed Barrett's, he was also found to have gastroparesis. He was prescribed Reglan, unable to tolerate, self discontinue it. Has definitely changed his lifestyle, see history of present illness. Recently  was switched to Protonix . Working with pain management to decrease the amount of painkillers that he is taking He already received some information about GERD diet but needs more information, will refer to a nutritionist per patient request.

## 2012-11-05 NOTE — Assessment & Plan Note (Signed)
Status post a recent admission, was quite hypoxic, currently doing better.

## 2012-11-05 NOTE — Assessment & Plan Note (Signed)
Well-controlled per last FLP, continue with Lipitor 80 mg

## 2012-11-09 ENCOUNTER — Encounter: Payer: Self-pay | Admitting: *Deleted

## 2012-11-27 ENCOUNTER — Encounter: Payer: Self-pay | Admitting: Internal Medicine

## 2012-11-27 ENCOUNTER — Encounter: Payer: BC Managed Care – PPO | Attending: Internal Medicine | Admitting: Dietician

## 2012-11-27 ENCOUNTER — Ambulatory Visit (INDEPENDENT_AMBULATORY_CARE_PROVIDER_SITE_OTHER): Payer: BC Managed Care – PPO | Admitting: Internal Medicine

## 2012-11-27 VITALS — BP 98/70 | HR 68 | Ht 70.0 in | Wt 243.6 lb

## 2012-11-27 DIAGNOSIS — K219 Gastro-esophageal reflux disease without esophagitis: Secondary | ICD-10-CM

## 2012-11-27 DIAGNOSIS — J449 Chronic obstructive pulmonary disease, unspecified: Secondary | ICD-10-CM

## 2012-11-27 DIAGNOSIS — E119 Type 2 diabetes mellitus without complications: Secondary | ICD-10-CM | POA: Insufficient documentation

## 2012-11-27 DIAGNOSIS — J4489 Other specified chronic obstructive pulmonary disease: Secondary | ICD-10-CM | POA: Insufficient documentation

## 2012-11-27 DIAGNOSIS — K3184 Gastroparesis: Secondary | ICD-10-CM

## 2012-11-27 DIAGNOSIS — J387 Other diseases of larynx: Secondary | ICD-10-CM

## 2012-11-27 DIAGNOSIS — Z713 Dietary counseling and surveillance: Secondary | ICD-10-CM | POA: Insufficient documentation

## 2012-11-27 DIAGNOSIS — K2270 Barrett's esophagus without dysplasia: Secondary | ICD-10-CM

## 2012-11-27 NOTE — Progress Notes (Signed)
Joseph [name] Jan 11, 1951 MRN [id number]        History of Present Illness:  This is a 62 year old white male with advanced rheumatoid arthritis. He is post hospitalization for aspiration pneumonia and severe respiratory distress due to  to aspiration and LPR. Contributing factor  has been  diabetic neuropathy, Fosamax and narcotics for rheumatoid arthritis. He has been home for 6 weeks and is markedly improved. Upper endoscopy while  in the hospital showed  distal esophagitis. Endoscopic impression suggested Barrett's esophagus but  the biopsies actually showed only chronic inflammation. There was no metaplasia or dysplasia. His gastric emptying scan was abnormal in that there was a 90% of the tracer remaining in the stomach after 120 minutes which was consistent with severe gastroparesis. Patient has been able to follow gastroparesis diet and has reduced his morphine from 30 mg twice a day to 15 mg twice a day and will completely discontinue morphine in next 2 months. His barium esophagram showed 2/5 non propulsive swallows small hiatal hernia and a positive water siphon c/w with gastroesophageal reflux. He has been on Protonix 40 mg twice a day strict antireflux measures. And he has been able to lose about 10 pounds he had an appointment with nutritionist this morning and he and his wife are trying to follow the dietary modifications    Past Medical History  Diagnosis Date  . Rheumatoid arthritis   . Psoriasis   . Hypertension   . Gastroesophageal reflux disease   . COPD (chronic obstructive pulmonary disease)   . CAD (coronary artery disease)     MI 2007, CABG  . Osteoporosis     per DEXA 05-2008  . Peripheral polyneuropathy     Severe, causing problems with his feet, see surgeries  . Diabetes mellitus     Dx 07-2008 A1C 6.2  . CHF (congestive heart failure)   . Onychomycosis     toenails  . PONV (postoperative nausea and vomiting)   . Hiatal hernia   . Esophageal dysmotility    . Gastroparesis   . Fatty liver 10/11/09  . Pulmonary fibrosis   . Neuropathy     (related to RA per neuro note 05-2011)   Past Surgical History  Procedure Laterality Date  . Foot surgery      to tendon release and repair of osteomyelitis   . Cholecystectomy    . Coronary artery bypass graft  2007    (LIMA to the LAD, left radial to obtuse marginal, SVG to first diagonal, SVG to PDA. His ejection fraction to 50-55%  . Achiles tendon surgery  8/09  . Toe amputation  4-12/ 3 14    d/t a deformity, found to have osteomyelitis, complicated by post-op foot strtess FX  . Esophagogastroduodenoscopy N/A 10/19/2012    Procedure: ESOPHAGOGASTRODUODENOSCOPY (EGD);  Surgeon: Hart Carwin, MD;  Location: [facility] ENDOSCOPY;  Service: Endoscopy;  Laterality: N/A;  the dilatation is possible.     reports that he quit smoking about 7 years ago. He quit smokeless tobacco use about 6 years ago. He reports that he does not drink alcohol. His drug history is not on file. family history includes Diverticulitis in his mother; Heart attack in his father; and Heart disease in his father and mother.  There is no history of Colon cancer and Prostate cancer. No Known Allergies      Review of Systems:  The remainder of the 10 point ROS is negative except as outlined in H&P   Physical Exam:  General appearance  Well developed, in no distress  nasal oxygen 3 l/min Eyes- non icteric. HEENT nontraumatic, normocephalic. Mouth no lesions, tongue papillated, no cheilosis. Neck supple without adenopathy, thyroid not enlarged, no carotid bruits, no JVD. Lungs inspiratory rales  Bilaterally., No wheezes  Cor normal S1, normal S2, regular rhythm, no murmur,  quiet precordium. Abdomen:  protuberant soft  Rectal: not done  Extremities no pedal edema. severe deformities of the upper and lower extremities  Skin no lesions. Neurological alert and oriented x 3. Psychological normal mood and affect.  Assessment and  Plan:   62 year old white male with all severe COPD and recent hospitalization for aspiration pneumonia attributed to severe gastroesophageal reflux, LPR gastroparesis. He has a  diabetic autonomic neuropathy on ache in used delayed gastric emptying documented on gastric emptying scan. He is currently doing very well on strict antireflux measures dietary modifications and protonix 40 mg twice a day. He is very satisfied with his progress. He and his wife had an eating with a nutritionist this morning concerning small frequent feedings and antireflux modifications. I will see him again in 3 months  Reflux esophagitis on upper endoscopy in February 2014. Although initial endoscopicimpression was that he has Barrett's esophagus the biopsies actually did not show any evidence of metaplasia or dysplasia. I discussed this with the patient .   11/27/2012 [name]

## 2012-11-27 NOTE — Progress Notes (Signed)
Medical Nutrition Therapy:  Appt start time: 1030 end time:  1130.  Assessment:  Primary concerns today: GERD.   MEDICATIONS: see list  Pt seen today primarily to clarify confusion of GERD diet. Pt has Hx of DM type II, now well controlled, COPD currently receiving oxygen support.   Based on previous advice from RDs inpatient, and information obtained from media, internet, pt and wife seem very confused about true treatments, although motivated to treat GERD properly.Pt has purchased new bed that sits upright, and claims to have had no signs/symptoms of GERD in past month.  Progress Towards Goal(s):  In progress.   Nutritional Diagnosis:  [place]-1.4 Altered GI function As related to poor LES pressure.  As evidenced by reflux related pneumonia, dx of GERD, Barrett's esophagus.    Intervention:  Nutrition counseling provided regarding physiology of gastroparesis, GERD, foods best to eliminate from diet, foods best to limit in diet, and foods best to test using elimination protocol. RD also covered many different options to incorporate more frequent, smaller meals to help prevent GERD symptoms. RD also emphasized the importance of protein foods for the COPD patient, and set need at 130 g Pro/d (1.2 g/kg). Assuming pt chooses a protein food at each of 5 small meals, supplemental protein should not be necessary. For best GERD treatment, pt will control fats in meals, and may consider using coconut oil as primary cooking oil (MCT content allows faster digestion and absorption). At length, RD discussed with pt and wife protocols to judge symptoms from many foods that may cause reflux, such as tomato, citrus, very fibrous plants, etc.   Goals: Mr. Dorwart will eat 5 small meals each day. Each meal will have a protein food component. Mr. Fogg will limit fat intake by choosing low fat milk, cheese, yogurt, avoiding deep fried foods, limit high fat meats, nuts, and seeds.  Mr. Ladnier will follow the GERD diet as  provided. Mr. Kleinman will not eat for 2-3 hours before bedtime, and remain upright after each meal for at least 30 minutes.  Handouts given during visit include:  GERD nutrition therapy  Monitoring/Evaluation:  Dietary intake, exercise, portion control, and body weight in 2 week(s).

## 2012-11-27 NOTE — Patient Instructions (Addendum)
Follow up as needed

## 2012-11-30 ENCOUNTER — Ambulatory Visit (INDEPENDENT_AMBULATORY_CARE_PROVIDER_SITE_OTHER): Payer: BC Managed Care – PPO | Admitting: Pulmonary Disease

## 2012-11-30 ENCOUNTER — Encounter: Payer: Self-pay | Admitting: Pulmonary Disease

## 2012-11-30 VITALS — BP 118/62 | HR 86 | Temp 98.2°F | Ht 73.0 in | Wt 242.2 lb

## 2012-11-30 DIAGNOSIS — J4489 Other specified chronic obstructive pulmonary disease: Secondary | ICD-10-CM

## 2012-11-30 DIAGNOSIS — J439 Emphysema, unspecified: Secondary | ICD-10-CM

## 2012-11-30 DIAGNOSIS — M069 Rheumatoid arthritis, unspecified: Secondary | ICD-10-CM

## 2012-11-30 DIAGNOSIS — J189 Pneumonia, unspecified organism: Secondary | ICD-10-CM | POA: Insufficient documentation

## 2012-11-30 DIAGNOSIS — J449 Chronic obstructive pulmonary disease, unspecified: Secondary | ICD-10-CM

## 2012-11-30 DIAGNOSIS — J9610 Chronic respiratory failure, unspecified whether with hypoxia or hypercapnia: Secondary | ICD-10-CM

## 2012-11-30 DIAGNOSIS — J438 Other emphysema: Secondary | ICD-10-CM

## 2012-11-30 DIAGNOSIS — R918 Other nonspecific abnormal finding of lung field: Secondary | ICD-10-CM

## 2012-11-30 NOTE — Assessment & Plan Note (Signed)
He is to continue 3 liters oxygen 24/7.

## 2012-11-30 NOTE — Assessment & Plan Note (Addendum)
Stable on current inhaler regimen.  Will repeat his PFT and call him with results.

## 2012-11-30 NOTE — Progress Notes (Signed)
Chief Complaint  Patient presents with  . Follow-up    Pt states his breathing is still doing ok. C/o cough w/ thick green phlem. denies any wheezing, chest tx. No acid reflux symptoms. Uses 3 liters oxygen 24/7.     CC: Joseph [name], Joseph [name]  History of Present Illness: Joseph [name] is a 62 y.o. male former smoker with COPD, rheumatoid arthritis, recurrent pneumonia 2nd to chronic aspiration/reflux, and chronic respiratory failure on 3 liters oxygen 24/7.  He was in hospital in February for pneumonia with concern for aspiration.  He has previously been followed by Dr. Maple Hudson.  His breathing is doing better.  He still has occasional cough with clear to yellow sputum.  He is not having wheeze, or chest pain.  He uses oxygen 24/7.  He uses advair and spiriva, and these help.  He has not needed to use albuterol recently.  He is on MTX and prednisone through rheumatology from [facility].  His reflux has been doing better.  TESTS: PFT 01/12/09 >> FEV1 3.02 (85%), FEV1% 68, TLC 6.35 (88%), DLCO 45%, no BD CT chest 10/17/12 >> severe centrilobular emphysema, apical bullae, 1 cm nodule LUL new, 5 mm nodule RLL stable since 2009, BTX with GGO at bases  Joseph [name]  has a past medical history of Rheumatoid arthritis; Psoriasis; Hypertension; Gastroesophageal reflux disease; COPD (chronic obstructive pulmonary disease); CAD (coronary artery disease); Osteoporosis; Peripheral polyneuropathy; Diabetes mellitus; CHF (congestive heart failure); Onychomycosis; PONV (postoperative nausea and vomiting); Hiatal hernia; Esophageal dysmotility; Gastroparesis; Fatty liver (10/11/09); Pulmonary fibrosis; and Neuropathy.  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  Prior to Admission medications   Medication Sig Start  Date End Date Taking? Authorizing Provider  albuterol (PROVENTIL,VENTOLIN) 90 MCG/ACT inhaler Inhale 2 puffs into the lungs every 4 (four) hours as needed for wheezing or shortness of breath. 04/09/11  Yes Waymon Budge, MD  amitriptyline (ELAVIL) 75 MG tablet Take 75 mg by mouth 3 (three) times daily.    Yes Historical Provider, MD  Ascorbic Acid (VITAMIN C) 500 MG tablet Take 500 mg by mouth daily.     Yes Historical Provider, MD  aspirin 81 MG tablet Take 81 mg by mouth daily.     Yes Historical Provider, MD  atorvastatin (LIPITOR) 80 MG tablet Take 1 tablet (80 mg total) by mouth daily. 02/18/12  Yes Rollene Rotunda, MD  calamine ([facility] CALAMINE) lotion     Yes Historical Provider, MD  Carbonyl Iron (PERFECT IRON PO) Take by mouth. 1 tab daily    Yes Historical Provider, MD  Fluticasone-Salmeterol (ADVAIR DISKUS) 100-50 MCG/DOSE AEPB 1 puff then rinse mouth, twice daily 03/16/12 03/16/13 Yes Clinton D Young, MD  FOLIC ACID PO Take 100 mcg by mouth daily.   Yes Historical Provider, MD  furosemide (LASIX) 20 MG tablet Take 60 mg by mouth daily. 2 in the morning and 1 in the afternoon 02/18/12  Yes Rollene Rotunda, MD  glucose blood (ONE TOUCH ULTRA TEST) test strip USE AS DIRECTED 11/04/12  Yes Wanda Plump, MD  Lancets [facility] ULTRASOFT) lancets Use as instructed 12/23/11 12/22/12 Yes Wanda Plump, MD  methotrexate (RHEUMATREX) 2.5 MG tablet Take 8 tablets every week 10/29/12  Yes Jeanella Craze, NP  metoprolol tartrate (LOPRESSOR) 25 MG tablet Take 1 tablet (25 mg total) by mouth 2 (two) times daily.  02/18/12  Yes Rollene Rotunda, MD  morphine (MS CONTIN) 15 MG 12 hr tablet Take 15 mg by mouth 2 (two) times daily as needed for pain.   Yes Historical Provider, MD  nitroGLYCERIN (NITROSTAT) 0.4 MG SL tablet Place 1 tablet (0.4 mg total) under the tongue every 5 (five) minutes as needed for chest pain. 02/18/12  Yes Rollene Rotunda, MD  NON FORMULARY [place] PORTABLE PULSE DOSE OXYGEN oxygen 3L per nasal cannula 24  hours a day Dx: 496   Yes Historical Provider, MD  nystatin (MYCOSTATIN) 100000 UNIT/ML suspension Take 5 mLs (500,000 Units total) by mouth 4 (four) times daily. 10/27/12  Yes Waymon Budge, MD  oxyCODONE-acetaminophen (PERCOCET) 7.5-325 MG per tablet Take 1 tablet by mouth. 3 times a day   Yes Historical Provider, MD  pantoprazole (PROTONIX) 40 MG tablet Take 1 tablet (40 mg total) by mouth 2 (two) times daily. 10/21/12  Yes Jeanella Craze, NP  potassium chloride SA (K-DUR,KLOR-CON) 20 MEQ tablet 2 by mouth in the morning and 1 by mouth at 4 pm 02/18/12  Yes Rollene Rotunda, MD  predniSONE (STERAPRED UNI-PAK) 10 MG tablet Take by mouth daily. TAKES 6 MG AM . 10/21/12  Yes Jeanella Craze, NP  pregabalin (LYRICA) 150 MG capsule Take 150 mg by mouth 3 (three) times daily.     Yes Historical Provider, MD  Respiratory Therapy Supplies (FLUTTER) DEVI Blow through several times, 2 or 3 sets per day as needed 09/16/11  Yes Waymon Budge, MD  tiotropium (SPIRIVA HANDIHALER) 18 MCG inhalation capsule Place 1 capsule (18 mcg total) into inhaler and inhale daily. 1 puff in AM 03/16/12 03/16/13 Yes Waymon Budge, MD  UNABLE TO FIND Med Name: Incentive Spirometer Use as directed 08/01/11  Yes Waymon Budge, MD  vitamin B-12 (CYANOCOBALAMIN) 250 MCG tablet Take 250 mcg by mouth daily.     Yes Historical Provider, MD    No Known Allergies   Physical Exam:  General - No distress, wearing oxygen, in wheelchair  ENT - No sinus tenderness, no oral exudate, no LAN Cardiac - s1s2 regular, no murmur Chest - No wheeze/rales/dullness Back - No focal tenderness Abd - Soft, non-tender Ext - deformities of rheumatoid arthritis Neuro - Normal strength Skin - No rashes Psych - normal mood, and behavior   Assessment/Plan:  Joseph Helling, MD [place] [facility] Pager:  [phone]

## 2012-11-30 NOTE — Patient Instructions (Signed)
Will schedule breathing test (PFT) and call with results Follow up in 6 months

## 2012-11-30 NOTE — Assessment & Plan Note (Signed)
Related to recurrent aspiration.  Improved since recent hospitalization in February.  He is followed by Dr. Juanda Chance with GI for her reflux.

## 2012-11-30 NOTE — Assessment & Plan Note (Addendum)
He is on chronic prednisone and MTX per rheumatology at [facility].  Will repeat his PFT and then determine when he needs additional imaging studies to monitor BTX and GGO on CT chest from February 2014.

## 2012-11-30 NOTE — Assessment & Plan Note (Signed)
He will need repeat CT chest in October 2014.

## 2012-12-02 ENCOUNTER — Ambulatory Visit (HOSPITAL_COMMUNITY)
Admission: RE | Admit: 2012-12-02 | Discharge: 2012-12-02 | Disposition: A | Discharge status: Home / Self Care | Payer: BC Managed Care – PPO | Source: Ambulatory Visit | Attending: Pulmonary Disease | Admitting: Pulmonary Disease

## 2012-12-02 DIAGNOSIS — J439 Emphysema, unspecified: Secondary | ICD-10-CM

## 2012-12-02 DIAGNOSIS — R05 Cough: Secondary | ICD-10-CM | POA: Insufficient documentation

## 2012-12-02 DIAGNOSIS — Z79899 Other long term (current) drug therapy: Secondary | ICD-10-CM | POA: Insufficient documentation

## 2012-12-02 DIAGNOSIS — J4489 Other specified chronic obstructive pulmonary disease: Secondary | ICD-10-CM | POA: Insufficient documentation

## 2012-12-02 DIAGNOSIS — R0989 Other specified symptoms and signs involving the circulatory and respiratory systems: Secondary | ICD-10-CM | POA: Insufficient documentation

## 2012-12-02 DIAGNOSIS — J988 Other specified respiratory disorders: Secondary | ICD-10-CM | POA: Insufficient documentation

## 2012-12-02 DIAGNOSIS — J449 Chronic obstructive pulmonary disease, unspecified: Secondary | ICD-10-CM | POA: Insufficient documentation

## 2012-12-02 DIAGNOSIS — R059 Cough, unspecified: Secondary | ICD-10-CM | POA: Insufficient documentation

## 2012-12-02 DIAGNOSIS — R0609 Other forms of dyspnea: Secondary | ICD-10-CM | POA: Insufficient documentation

## 2012-12-02 MED ORDER — ALBUTEROL SULFATE (5 MG/ML) 0.5% IN NEBU
2.5000 mg | INHALATION_SOLUTION | Freq: Once | RESPIRATORY_TRACT | Status: AC
  Administered 2012-12-02: 2.5 mg via RESPIRATORY_TRACT

## 2012-12-15 ENCOUNTER — Telehealth: Payer: Self-pay | Admitting: Pulmonary Disease

## 2012-12-15 NOTE — Telephone Encounter (Signed)
PFT 01/12/09 >> FEV1 3.02 (85%), FEV1% 68, TLC 6.35 (88%), DLCO 45%, no BD  PFT 12/02/12 >> FEV1 3.11 (78%), FEV1% 77, TLC 6.32 (83%), DLCO 37%, no BD  Results d/w pt.  Explained that he has severe diffusion defect likely related to emphysema and rheumatoid arthritis, but not significant changed compared to results from 2010.  No change to current treatment plan.

## 2012-12-23 ENCOUNTER — Other Ambulatory Visit: Payer: Self-pay

## 2012-12-23 MED ORDER — POTASSIUM CHLORIDE CRYS ER 20 MEQ PO TBCR: 20.0000 meq | EXTENDED_RELEASE_TABLET | Freq: Three times a day (TID) | ORAL | Status: DC

## 2012-12-25 ENCOUNTER — Ambulatory Visit: Payer: BC Managed Care – PPO | Admitting: Dietician

## 2012-12-25 ENCOUNTER — Encounter: Payer: BC Managed Care – PPO | Attending: Internal Medicine | Admitting: Dietician

## 2012-12-25 DIAGNOSIS — J4489 Other specified chronic obstructive pulmonary disease: Secondary | ICD-10-CM | POA: Insufficient documentation

## 2012-12-25 DIAGNOSIS — K219 Gastro-esophageal reflux disease without esophagitis: Secondary | ICD-10-CM | POA: Insufficient documentation

## 2012-12-25 DIAGNOSIS — J439 Emphysema, unspecified: Secondary | ICD-10-CM

## 2012-12-25 DIAGNOSIS — Z713 Dietary counseling and surveillance: Secondary | ICD-10-CM | POA: Insufficient documentation

## 2012-12-25 DIAGNOSIS — J449 Chronic obstructive pulmonary disease, unspecified: Secondary | ICD-10-CM | POA: Insufficient documentation

## 2012-12-25 DIAGNOSIS — E119 Type 2 diabetes mellitus without complications: Secondary | ICD-10-CM | POA: Insufficient documentation

## 2012-12-25 DIAGNOSIS — K3184 Gastroparesis: Secondary | ICD-10-CM

## 2012-12-25 NOTE — Progress Notes (Signed)
Assessment:  Primary concerns today: continued GERD diet, COPD, gastroparesis.   MEDICATIONS: see list   DIETARY INTAKE:  Usual eating pattern includes 5 meals and 0 snacks per day.  Pt has been adapting to new eating pattern well, and has had no GERD symptoms since leaving [facility] inpatient.   Usual physical activity: pt reports he has now been cleared for exercise by his orthopedist. Prior to orthopedic surgery, had a routine of machines and swim classes at the [facility].  PT now focused on losing wt, eating appropriately for DM and COPD.  Per gastroenterologist, biopsy revealed no Barrett's esophagus, just chronic inflammation.  Progress Towards Goal(s):  In progress.    Intervention:  Nutrition counseling provided regarding continued adherence to previous dietary recommendations. In response to concerns about wt loss, DM, and COPD, RD reinforced previously established diet interventions- high protein foods at each meal to increase satiety, assist lung function and tissue repair; low, consistent CHO intake; reduction of high fat food intake. RD encouraged pt to renew exercise habits, with goal of minimum 150 min per week.  Pt claims to be struggling to increase variety of diet with eating more often. RD provided recipes and additional food ideas/recommendations.  Monitoring/Evaluation:  Dietary intake, exercise, portion control, and body weight in 5 week(s).

## 2013-01-26 ENCOUNTER — Telehealth: Payer: Self-pay | Admitting: Internal Medicine

## 2013-01-26 NOTE — Telephone Encounter (Signed)
agree, he needs to be seen by August or September. He takes methotrexate, i think needs routine labs (results to be  faxed to rheumatology). Please clarify with patient . No labs needed from my side.

## 2013-01-26 NOTE — Telephone Encounter (Signed)
Patient  Is calling to see if he needs to have lab work done. States that he thought he had standing orders. I did not see any in his chart.

## 2013-01-26 NOTE — Telephone Encounter (Signed)
According to pt's last OV, he just needs to schedule a follow-up in August or September. Please verify.

## 2013-01-27 NOTE — Telephone Encounter (Signed)
Spoke to pt, he states he does need lab work done for his rheumatologist. They are going to fax over the orders.

## 2013-02-01 ENCOUNTER — Ambulatory Visit: Payer: BC Managed Care – PPO | Admitting: Dietician

## 2013-02-03 ENCOUNTER — Telehealth: Payer: Self-pay | Admitting: Internal Medicine

## 2013-02-03 ENCOUNTER — Other Ambulatory Visit: Payer: Self-pay | Admitting: Internal Medicine

## 2013-02-03 ENCOUNTER — Encounter: Payer: Self-pay | Admitting: *Deleted

## 2013-02-03 ENCOUNTER — Telehealth: Payer: Self-pay | Admitting: Pulmonary Disease

## 2013-02-03 NOTE — Telephone Encounter (Signed)
[facility] is aware that this prior Berkley Harvey was initiated this morning. Nothing further was needed.

## 2013-02-03 NOTE — Telephone Encounter (Signed)
Refill done.  

## 2013-02-05 ENCOUNTER — Telehealth: Payer: Self-pay | Admitting: Pulmonary Disease

## 2013-02-05 NOTE — Telephone Encounter (Signed)
Received a letter from [employer] letting us know that Prontonix has been been approved x 1 year.  [facility] is aware of this information. Pt is aware as well.

## 2013-02-05 NOTE — Telephone Encounter (Signed)
Spoke to pharmacist about pt's request for a prior auth.on Pantoprazole, as I had not received any communication from them.  Pharmacist she was going to try to work with it to get it to go through on her end and would contact me if she did, in fact, end up needing a prior authorization.

## 2013-02-15 ENCOUNTER — Ambulatory Visit: Payer: BC Managed Care – PPO | Admitting: Internal Medicine

## 2013-02-15 ENCOUNTER — Ambulatory Visit: Payer: BC Managed Care – PPO | Admitting: Pulmonary Disease

## 2013-02-24 ENCOUNTER — Telehealth: Payer: Self-pay | Admitting: Internal Medicine

## 2013-02-24 DIAGNOSIS — E785 Hyperlipidemia, unspecified: Secondary | ICD-10-CM

## 2013-02-24 DIAGNOSIS — E119 Type 2 diabetes mellitus without complications: Secondary | ICD-10-CM

## 2013-02-24 NOTE — Telephone Encounter (Signed)
Patient is coming in on Tuesday, 03/02/13, for lab work ordered by his rheumatologist. He states he is also due for his A1c. Can you see if he is due for anything else?

## 2013-02-24 NOTE — Telephone Encounter (Signed)
Lipid, hep, bmp, hgba1c  250.00  272.4

## 2013-02-25 NOTE — Telephone Encounter (Signed)
Lab orders entered

## 2013-03-02 ENCOUNTER — Other Ambulatory Visit (INDEPENDENT_AMBULATORY_CARE_PROVIDER_SITE_OTHER): Payer: BC Managed Care – PPO

## 2013-03-02 ENCOUNTER — Ambulatory Visit (INDEPENDENT_AMBULATORY_CARE_PROVIDER_SITE_OTHER): Payer: BC Managed Care – PPO | Admitting: Internal Medicine

## 2013-03-02 ENCOUNTER — Encounter: Payer: Self-pay | Admitting: Internal Medicine

## 2013-03-02 ENCOUNTER — Other Ambulatory Visit: Payer: Self-pay

## 2013-03-02 VITALS — BP 100/62 | HR 80 | Ht 70.0 in | Wt 252.1 lb

## 2013-03-02 DIAGNOSIS — K3184 Gastroparesis: Secondary | ICD-10-CM

## 2013-03-02 DIAGNOSIS — E119 Type 2 diabetes mellitus without complications: Secondary | ICD-10-CM

## 2013-03-02 DIAGNOSIS — K219 Gastro-esophageal reflux disease without esophagitis: Secondary | ICD-10-CM

## 2013-03-02 DIAGNOSIS — E785 Hyperlipidemia, unspecified: Secondary | ICD-10-CM

## 2013-03-02 DIAGNOSIS — J387 Other diseases of larynx: Secondary | ICD-10-CM

## 2013-03-02 DIAGNOSIS — M069 Rheumatoid arthritis, unspecified: Secondary | ICD-10-CM

## 2013-03-02 LAB — CBC WITH DIFFERENTIAL/PLATELET
Basophils Relative: 0.5 % (ref 0.0–3.0)
Eosinophils Relative: 3.8 % (ref 0.0–5.0)
Lymphocytes Relative: 11.6 % — ABNORMAL LOW (ref 12.0–46.0)
MCV: 95.3 fl (ref 78.0–100.0)
Monocytes Absolute: 0.4 10*3/uL (ref 0.1–1.0)
Monocytes Relative: 4.8 % (ref 3.0–12.0)
Neutrophils Relative %: 79.3 % — ABNORMAL HIGH (ref 43.0–77.0)
RBC: 4.28 Mil/uL (ref 4.22–5.81)
WBC: 8.3 10*3/uL (ref 4.5–10.5)

## 2013-03-02 LAB — BASIC METABOLIC PANEL
BUN: 14 mg/dL (ref 6–23)
Chloride: 106 mEq/L (ref 96–112)
Potassium: 3.2 mEq/L — ABNORMAL LOW (ref 3.5–5.1)

## 2013-03-02 LAB — HEPATIC FUNCTION PANEL
Bilirubin, Direct: 0.1 mg/dL (ref 0.0–0.3)
Total Bilirubin: 0.4 mg/dL (ref 0.3–1.2)
Total Protein: 6.7 g/dL (ref 6.0–8.3)

## 2013-03-02 LAB — CREATININE, SERUM: Creatinine, Ser: 0.8 mg/dL (ref 0.4–1.5)

## 2013-03-02 LAB — LIPID PANEL
Cholesterol: 149 mg/dL (ref 0–200)
LDL Cholesterol: 87 mg/dL (ref 0–99)
VLDL: 23.6 mg/dL (ref 0.0–40.0)

## 2013-03-02 LAB — AST: AST: 30 U/L (ref 0–37)

## 2013-03-02 LAB — HEMOGLOBIN A1C: Hgb A1c MFr Bld: 6.6 % — ABNORMAL HIGH (ref 4.6–6.5)

## 2013-03-02 MED ORDER — FUROSEMIDE 20 MG PO TABS: 60.0000 mg | ORAL_TABLET | Freq: Every day | ORAL | Status: DC

## 2013-03-02 MED ORDER — METOPROLOL TARTRATE 25 MG PO TABS: 25.0000 mg | ORAL_TABLET | Freq: Two times a day (BID) | ORAL | Status: DC

## 2013-03-02 MED ORDER — POTASSIUM CHLORIDE CRYS ER 20 MEQ PO TBCR: 20.0000 meq | EXTENDED_RELEASE_TABLET | Freq: Three times a day (TID) | ORAL | Status: DC

## 2013-03-02 NOTE — Telephone Encounter (Signed)
furosemide (LASIX) 20 MG tablet  TAKE TWO TABLETS BY MOUTH IN THE MORNING AND ONE IN THE EVENING  60 tablet  potassium chloride SA (K-DUR,KLOR-CON) 20 MEQ tablet  2 by mouth in the morning and 1 by mouth at 4 pm  metoprolol tartrate (LOPRESSOR) 25 MG tablet  TAKE 1 TABLET BY MOUTH TWICE DAILY  60 tablet   Patient Instructions  The current medical regimen is effective;  continue present plan and medications. Follow up in 1 year with Dr Antoine Poche.  You will receive a letter in the mail 2 months before you are due.  Please call us when you receive this letter to schedule your follow up appointment. Rollene Rotunda, MD at 02/18/2012 10:11 PM

## 2013-03-02 NOTE — Patient Instructions (Addendum)
Please increase exercise!  You may have 1 cup of coffee daily.  Continue Protonix 40 mg twice daily.  Please follow up with Dr Juanda Chance in 6 months.  Cc: Dr Willow Ora, Dr Craige Cotta

## 2013-03-02 NOTE — Progress Notes (Signed)
Joseph [name] [birth year] MRN [id number]        History of Present Illness:  This is a 62 year old white male with severe rheumatoid arthritis and chronic respiratory failure , hospitalized for aspiration pneumonia due to LPR in February 2014. He also has diabetic neuropathy and gastroparesis. He was at the time on Fosamax which was since then discontinued. Upper endoscopy in April 2014 showed esophagitis but no evidence of Barrett's esophagus. His gastric emptying scan while taking morphine and Percocet was abnormal at 90% retention in 2 hours. Barium esophagram in April showed 2-5 nonpropulsive contractions. Patient has been markedly improved on strict antireflux measurements which he and his wife follow strictly, and he has lost weight although he has gained some of it back. He also reduced his caffeine intake and elevated the head of the bed at night. He was able to discontinue his morphine  entirely and takes only on oxycodone 3 times a day. He just started exercising. He denies any heartburn. He had only one episode of reflux since I have seen him in April 2014 he denies any hoarseness but has occasional nocturnal cough    Past Medical History  Diagnosis Date  . Rheumatoid arthritis(714.0)   . Psoriasis   . Hypertension   . Gastroesophageal reflux disease   . COPD (chronic obstructive pulmonary disease)   . CAD (coronary artery disease)     MI 2007, CABG  . Osteoporosis     per DEXA 05-2008  . Peripheral polyneuropathy     Severe, causing problems with his feet, see surgeries  . Diabetes mellitus     Dx 07-2008 A1C 6.2  . CHF (congestive heart failure)   . Onychomycosis     toenails  . PONV (postoperative nausea and vomiting)   . Hiatal hernia   . Esophageal dysmotility   . Gastroparesis   . Fatty liver 10/11/09  . Pulmonary fibrosis   . Neuropathy     (related to RA per neuro note 05-2011)   Past Surgical History  Procedure Laterality Date  . Foot surgery      to  tendon release and repair of osteomyelitis   . Cholecystectomy    . Coronary artery bypass graft  2007    (LIMA to the LAD, left radial to obtuse marginal, SVG to first diagonal, SVG to PDA. His ejection fraction to 50-55%  . Achiles tendon surgery  8/09  . Toe amputation  4-12/ 3 14    d/t a deformity, found to have osteomyelitis, complicated by post-op foot strtess FX  . Esophagogastroduodenoscopy N/A 10/19/2012    Procedure: ESOPHAGOGASTRODUODENOSCOPY (EGD);  Surgeon: Hart Carwin, MD;  Location: [facility] ENDOSCOPY;  Service: Endoscopy;  Laterality: N/A;  the dilatation is possible.     reports that he quit smoking about 7 years ago. He quit smokeless tobacco use about 6 years ago. He reports that he does not drink alcohol. His drug history is not on file. family history includes Diverticulitis in his mother; Heart attack in his father; and Heart disease in his father and mother.  There is no history of Colon cancer and Prostate cancer. No Known Allergies      Review of Systems:denies chest pain  The remainder of the 10 point ROS is negative except as outlined in H&P   Physical Exam: General appearance  Well developed, in no distress. Overweight, with nasal oxygen Eyes- non icteric. HEENT nontraumatic, normocephalic. Mouth no lesions, tongue papillated, no cheilosis. Neck supple without adenopathy, thyroid not  enlarged, no carotid bruits, no JVD. Lungs Clear to auscultation bilaterally. Increased breath sounds but no wheezes Cor normal S1, normal S2, regular rhythm, no murmur,  quiet precordium. Abdomen: Protuberant but nontender  Rectal: Not done Extremities no pedal edema. Skin no lesions. Neurological alert and oriented x 3. Psychological normal mood and affect.  Assessment and Plan:  62 year old white male with the severe gastroesophageal reflux in the setting of severe COPD and LPR and rheumatoid arthritis. His quality of life has markedly improved since he has lost weight and  takes strict antireflux measures. He will continue on Protonix 40 mg daily, limited his caffeine intake and increase  exercise. I will see him in 6 months   03/02/2013 Joseph [name]

## 2013-03-03 ENCOUNTER — Telehealth: Payer: Self-pay | Admitting: *Deleted

## 2013-03-03 LAB — RHEUMATOID FACTOR: Rhuematoid fact SerPl-aCnc: >600 IU/mL — ABNORMAL HIGH (ref <=14)

## 2013-03-03 NOTE — Telephone Encounter (Signed)
Received call from [facility] with [facility] with an Amending report on the pt's Rheumatoid Factor.  Misty Stanley stated the results was released last night @ 112, but retest showed today results as Rheumatoid Factor:>600.  Results come in and reviewed and addressed by Dr. Laury Axon and then routed to Dr. Drue Novel which he addressed.//AB/CMA

## 2013-03-04 ENCOUNTER — Telehealth: Payer: Self-pay | Admitting: *Deleted

## 2013-03-04 ENCOUNTER — Other Ambulatory Visit: Payer: Self-pay | Admitting: *Deleted

## 2013-03-04 DIAGNOSIS — E785 Hyperlipidemia, unspecified: Secondary | ICD-10-CM

## 2013-03-04 DIAGNOSIS — R7309 Other abnormal glucose: Secondary | ICD-10-CM

## 2013-03-04 MED ORDER — ATORVASTATIN CALCIUM 80 MG PO TABS: 80.0000 mg | ORAL_TABLET | Freq: Every day | ORAL | Status: DC

## 2013-03-04 NOTE — Telephone Encounter (Signed)
Lab orders entered per prior orders Dr. Laury Axon

## 2013-03-04 NOTE — Telephone Encounter (Signed)
Message copied by Shirlee More I on Thu Mar 04, 2013  5:02 PM ------      Message from: Joseph [name]      Created: Thu Mar 04, 2013  4:58 PM       Increase potassium 20 mEq to----->  2 tablets twice a day ------

## 2013-03-04 NOTE — Telephone Encounter (Signed)
Message copied by Shirlee More I on Thu Mar 04, 2013 10:57 AM ------      Message from: Willow Ora E      Created: Wed Mar 03, 2013  4:34 PM       Agree, send results to rheumatology.      Cholesterol and diabetes well controlled but the A1c is increasing.      Potassium is low, he is on Lasix      Call  KCl 10 mEq one tablet daily #30 and one refill.      he's due for a checkup, please arrange ------

## 2013-03-04 NOTE — Telephone Encounter (Signed)
Recent with correct provider

## 2013-03-08 ENCOUNTER — Encounter: Payer: Self-pay | Admitting: Internal Medicine

## 2013-03-08 ENCOUNTER — Ambulatory Visit (INDEPENDENT_AMBULATORY_CARE_PROVIDER_SITE_OTHER): Payer: BC Managed Care – PPO | Admitting: Internal Medicine

## 2013-03-08 VITALS — BP 120/96 | HR 94 | Temp 98.1°F | Wt 253.8 lb

## 2013-03-08 DIAGNOSIS — E119 Type 2 diabetes mellitus without complications: Secondary | ICD-10-CM

## 2013-03-08 DIAGNOSIS — K219 Gastro-esophageal reflux disease without esophagitis: Secondary | ICD-10-CM

## 2013-03-08 DIAGNOSIS — I10 Essential (primary) hypertension: Secondary | ICD-10-CM

## 2013-03-08 NOTE — Assessment & Plan Note (Signed)
Well-controlled per last hemoglobin A1c

## 2013-03-08 NOTE — Assessment & Plan Note (Addendum)
Well-controlled, no change; last potassium is slightly low, KCL dose increased. Plan: Recheck a potassium in 3 weeks

## 2013-03-08 NOTE — Assessment & Plan Note (Signed)
Symptoms controlled at this time

## 2013-03-08 NOTE — Progress Notes (Signed)
  Subjective:    Patient ID: Joseph [name], male    DOB: Mar 30, 1951, 62 y.o.   MRN: [id number]  HPI Routine office visit, in general doing well. Medication list reviewed, good compliance with all meds. Diet has improved, he is trying to be more active at the [facility], [facility], [facility].  Past Medical History  Diagnosis Date  . Rheumatoid arthritis(714.0)   . Psoriasis   . Hypertension   . Gastroesophageal reflux disease   . COPD (chronic obstructive pulmonary disease)   . CAD (coronary artery disease)     MI 2007, CABG  . Osteoporosis     per DEXA 05-2008  . Peripheral polyneuropathy     Severe, causing problems with his feet, see surgeries  . Diabetes mellitus     Dx 07-2008 A1C 6.2  . CHF (congestive heart failure)   . Onychomycosis     toenails  . PONV (postoperative nausea and vomiting)   . Hiatal hernia   . Esophageal dysmotility   . Gastroparesis   . Fatty liver 10/11/09  . Pulmonary fibrosis   . Neuropathy     (related to RA per neuro note 05-2011)   Past Surgical History  Procedure Laterality Date  . Foot surgery      to tendon release and repair of osteomyelitis   . Cholecystectomy    . Coronary artery bypass graft  2007    (LIMA to the LAD, left radial to obtuse marginal, SVG to first diagonal, SVG to PDA. His ejection fraction to 50-55%  . Achiles tendon surgery  8/09  . Toe amputation  4-12/ 3 14    d/t a deformity, found to have osteomyelitis, complicated by post-op foot strtess FX  . Esophagogastroduodenoscopy N/A 10/19/2012    Procedure: ESOPHAGOGASTRODUODENOSCOPY (EGD);  Surgeon: Hart Carwin, MD;  Location: [facility] ENDOSCOPY;  Service: Endoscopy;  Laterality: N/A;  the dilatation is possible.     Social History:  Married, No children  Former smoker. Quit in 2007. Smoked for 40 years at 1 ppd  ETOH--no  disabled-retired    Review of Systems No chest pain, shortness of breath at baseline. No nausea, vomiting, diarrhea. GERD symptoms definitely improved, good compliance  with meds, has a hospital bed and thus able to sleep with the head up    Objective:   Physical Exam BP 120/96  Pulse 94  Temp(Src) 98.1 F (36.7 C) (Oral)  Wt 253 lb 12.8 oz (115.123 kg)  BMI 36.42 kg/m2  SpO2 93%  General -- alert, well-developed, NAD .   Lungs -- normal respiratory effort, no intercostal retractions, no accessory muscle use, and normal breath sounds.   Heart-- normal rate, regular rhythm, no murmur, and no gallop.   Extremities-- no pretibial edema bilaterally Psych-- Cognition and judgment appear intact. Alert and cooperative with normal attention span and concentration.  not anxious appearing and not depressed appearing.        Assessment & Plan:

## 2013-03-08 NOTE — Patient Instructions (Addendum)
Schedule an appointment for blood work, 3 weeks from now: K+-- dx HTN Next visit with me in 4 months fasting for a physical exam

## 2013-03-10 ENCOUNTER — Other Ambulatory Visit: Payer: Self-pay | Admitting: Pulmonary Disease

## 2013-03-10 MED ORDER — PANTOPRAZOLE SODIUM 40 MG PO TBEC: 40.0000 mg | DELAYED_RELEASE_TABLET | Freq: Two times a day (BID) | ORAL | Status: DC

## 2013-03-29 ENCOUNTER — Other Ambulatory Visit: Payer: BC Managed Care – PPO

## 2013-03-30 ENCOUNTER — Other Ambulatory Visit: Payer: Self-pay | Admitting: Internal Medicine

## 2013-03-30 ENCOUNTER — Other Ambulatory Visit (INDEPENDENT_AMBULATORY_CARE_PROVIDER_SITE_OTHER): Payer: BC Managed Care – PPO

## 2013-03-30 ENCOUNTER — Telehealth: Payer: Self-pay | Admitting: Pulmonary Disease

## 2013-03-30 DIAGNOSIS — R7309 Other abnormal glucose: Secondary | ICD-10-CM

## 2013-03-30 DIAGNOSIS — E785 Hyperlipidemia, unspecified: Secondary | ICD-10-CM

## 2013-03-30 DIAGNOSIS — J449 Chronic obstructive pulmonary disease, unspecified: Secondary | ICD-10-CM

## 2013-03-30 MED ORDER — FLUTTER DEVI: Status: DC

## 2013-03-30 NOTE — Telephone Encounter (Signed)
Pt spouse came by the office needing a new flutter valve. I provided them with one and they signed delivery ticket and it was placed in [facility] folder. Carron Curie, CMA

## 2013-03-31 LAB — LIPID PANEL
HDL: 34.9 mg/dL — ABNORMAL LOW (ref >39.00)
LDL Cholesterol: 75 mg/dL (ref 0–99)
VLDL: 20.4 mg/dL (ref 0.0–40.0)

## 2013-03-31 LAB — BASIC METABOLIC PANEL
Calcium: 9.2 mg/dL (ref 8.4–10.5)
GFR: 104.39 mL/min (ref >60.00)
Potassium: 4 mEq/L (ref 3.5–5.1)
Sodium: 140 mEq/L (ref 135–145)

## 2013-03-31 LAB — HEPATIC FUNCTION PANEL
AST: 29 U/L (ref 0–37)
Alkaline Phosphatase: 68 U/L (ref 39–117)
Bilirubin, Direct: 0.1 mg/dL (ref 0.0–0.3)
Total Bilirubin: 0.8 mg/dL (ref 0.3–1.2)

## 2013-03-31 LAB — HEMOGLOBIN A1C: Hgb A1c MFr Bld: 6.4 % (ref 4.6–6.5)

## 2013-04-07 ENCOUNTER — Encounter: Payer: Self-pay | Admitting: Internal Medicine

## 2013-04-07 ENCOUNTER — Telehealth: Payer: Self-pay | Admitting: Pulmonary Disease

## 2013-04-07 ENCOUNTER — Ambulatory Visit (INDEPENDENT_AMBULATORY_CARE_PROVIDER_SITE_OTHER): Payer: BC Managed Care – PPO | Admitting: Internal Medicine

## 2013-04-07 VITALS — BP 140/90 | HR 91 | Temp 98.6°F | Ht 73.0 in | Wt 259.0 lb

## 2013-04-07 DIAGNOSIS — J439 Emphysema, unspecified: Secondary | ICD-10-CM

## 2013-04-07 DIAGNOSIS — J438 Other emphysema: Secondary | ICD-10-CM

## 2013-04-07 DIAGNOSIS — J9610 Chronic respiratory failure, unspecified whether with hypoxia or hypercapnia: Secondary | ICD-10-CM

## 2013-04-07 MED ORDER — AZITHROMYCIN 250 MG PO TABS: ORAL_TABLET | ORAL | Status: DC

## 2013-04-07 NOTE — Telephone Encounter (Signed)
ATC PT NA received message mailbox is full and not able to leave message. I was able to leave a call back # according to options. I did so. Will await call back.  I called home #. I spoke with pt and he thinks he may have a chest cold. He is scheduled to come in and see MW at 4:15 today. Nothing further needed

## 2013-04-07 NOTE — Progress Notes (Signed)
Chief Complaint  Patient presents with  . Acute Visit    Pt c/o increased congestion x 4 days- using flutter valve and not producing much sputum- cream colored sputum. He has also noticed slight increase in SOB since cough statrted.    CC: Joseph [name], Joseph [name]  History of Present Illness: Joseph [name] is a 62 y.o. male former smoker with COPD, rheumatoid arthritis, recurrent pneumonia 2nd to chronic aspiration/reflux, and chronic respiratory failure on 3 liters oxygen 24/7.  He was in hospital in February for pneumonia with concern for aspiration.  He has previously been followed by Dr. Maple Hudson.  His breathing is doing better.  He still has occasional cough with clear to yellow sputum.  He is not having wheeze, or chest pain.  He uses oxygen 24/7.  He uses advair and spiriva, and these help.  He has not needed to use albuterol recently.  He is on MTX and prednisone through rheumatology from [facility].  His reflux has been doing better.  TESTS: PFT 01/12/09 >> FEV1 3.02 (85%), FEV1% 68, TLC 6.35 (88%), DLCO 45%, no BD CT chest 10/17/12 >> severe centrilobular emphysema, apical bullae, 1 cm nodule LUL new, 5 mm nodule RLL stable since 2009, BTX with GGO at bases    04/07/2013  Acute w/in  ov/Wert  Prednisone 5 mg daily and pantoprazole 40 mg  Ac and qhs  Chief Complaint  Patient presents with  . Acute Visit    Pt c/o increased congestion x 4 days- using flutter valve and not producing much sputum- cream colored sputum. He has also noticed slight increase in SOB since cough statrted.   some discomfort with coughing paroxyms in center of chest anteriorly, min sob, min increase in baseline saba need, no rigors.  No obvious daytime variabilty or assoc  r chest tightness, subjective wheeze overt sinus or hb symptoms. No unusual exp hx or h/o childhood pna/ asthma or knowledge of premature birth.   Current Medications, Allergies, Past Medical History, Past Surgical History, Family History,  and Social History were reviewed in [employer] record.  ROS  The following are not active complaints unless bolded sore throat, dysphagia, dental problems, itching, sneezing,  nasal congestion or excess/ purulent secretions, ear ache,   fever, chills, sweats, unintended wt loss, pleuritic or exertional cp, hemoptysis,  orthopnea pnd or leg swelling, presyncope, palpitations, heartburn, abdominal pain, anorexia, nausea, vomiting, diarrhea  or change in bowel or urinary habits, change in stools or urine, dysuria,hematuria,  rash, arthralgias, visual complaints, headache, numbness weakness or ataxia or problems with walking or coordination,  change in mood/affect or memory.        Joseph [name]  has a past medical history of Rheumatoid arthritis(714.0); Psoriasis; Hypertension; Gastroesophageal reflux disease; COPD (chronic obstructive pulmonary disease); CAD (coronary artery disease); Osteoporosis; Peripheral polyneuropathy; Diabetes mellitus; CHF (congestive heart failure); Onychomycosis; PONV (postoperative nausea and vomiting); Hiatal hernia; Esophageal dysmotility; Gastroparesis; Fatty liver (10/11/09); Pulmonary fibrosis; and Neuropathy.  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  Prior to Admission medications   Medication Sig Start Date End Date Taking? Authorizing Provider  albuterol (PROVENTIL,VENTOLIN) 90 MCG/ACT inhaler Inhale 2 puffs into the lungs every 4 (four) hours as needed for wheezing or shortness of breath. 04/09/11  Yes Waymon Budge, MD  amitriptyline (ELAVIL) 75 MG tablet Take 75 mg by  mouth 3 (three) times daily.    Yes Historical Provider, MD  Ascorbic Acid (VITAMIN C) 500 MG tablet Take 500 mg by mouth daily.     Yes Historical Provider, MD  aspirin 81 MG tablet  Take 81 mg by mouth daily.     Yes Historical Provider, MD  atorvastatin (LIPITOR) 80 MG tablet Take 1 tablet (80 mg total) by mouth daily. 02/18/12  Yes Rollene Rotunda, MD  calamine ([facility] CALAMINE) lotion     Yes Historical Provider, MD  Carbonyl Iron (PERFECT IRON PO) Take by mouth. 1 tab daily    Yes Historical Provider, MD  Fluticasone-Salmeterol (ADVAIR DISKUS) 100-50 MCG/DOSE AEPB 1 puff then rinse mouth, twice daily 03/16/12 03/16/13 Yes Clinton D Young, MD  FOLIC ACID PO Take 100 mcg by mouth daily.   Yes Historical Provider, MD  furosemide (LASIX) 20 MG tablet Take 60 mg by mouth daily. 2 in the morning and 1 in the afternoon 02/18/12  Yes Rollene Rotunda, MD  glucose blood (ONE TOUCH ULTRA TEST) test strip USE AS DIRECTED 11/04/12  Yes Wanda Plump, MD  Lancets [facility] ULTRASOFT) lancets Use as instructed 12/23/11 12/22/12 Yes Wanda Plump, MD  methotrexate (RHEUMATREX) 2.5 MG tablet Take 8 tablets every week 10/29/12  Yes Jeanella Craze, NP  metoprolol tartrate (LOPRESSOR) 25 MG tablet Take 1 tablet (25 mg total) by mouth 2 (two) times daily. 02/18/12  Yes Rollene Rotunda, MD  morphine (MS CONTIN) 15 MG 12 hr tablet Take 15 mg by mouth 2 (two) times daily as needed for pain.   Yes Historical Provider, MD  nitroGLYCERIN (NITROSTAT) 0.4 MG SL tablet Place 1 tablet (0.4 mg total) under the tongue every 5 (five) minutes as needed for chest pain. 02/18/12  Yes Rollene Rotunda, MD  NON FORMULARY EVERGO PORTABLE PULSE DOSE OXYGEN oxygen 3L per nasal cannula 24 hours a day Dx: 496   Yes Historical Provider, MD  nystatin (MYCOSTATIN) 100000 UNIT/ML suspension Take 5 mLs (500,000 Units total) by mouth 4 (four) times daily. 10/27/12  Yes Waymon Budge, MD  oxyCODONE-acetaminophen (PERCOCET) 7.5-325 MG per tablet Take 1 tablet by mouth. 3 times a day   Yes Historical Provider, MD  pantoprazole (PROTONIX) 40 MG tablet Take 1 tablet (40 mg total) by mouth 2 (two) times daily. 10/21/12  Yes Jeanella Craze, NP   potassium chloride SA (K-DUR,KLOR-CON) 20 MEQ tablet 2 by mouth in the morning and 1 by mouth at 4 pm 02/18/12  Yes Rollene Rotunda, MD  predniSONE (STERAPRED UNI-PAK) 10 MG tablet Take by mouth daily. TAKES 6 MG AM . 10/21/12  Yes Jeanella Craze, NP  pregabalin (LYRICA) 150 MG capsule Take 150 mg by mouth 3 (three) times daily.     Yes Historical Provider, MD  Respiratory Therapy Supplies (FLUTTER) DEVI Blow through several times, 2 or 3 sets per day as needed 09/16/11  Yes Waymon Budge, MD  tiotropium (SPIRIVA HANDIHALER) 18 MCG inhalation capsule Place 1 capsule (18 mcg total) into inhaler and inhale daily. 1 puff in AM 03/16/12 03/16/13 Yes Waymon Budge, MD  UNABLE TO FIND Med Name: Incentive Spirometer Use as directed 08/01/11  Yes Waymon Budge, MD  vitamin B-12 (CYANOCOBALAMIN) 250 MCG tablet Take 250 mcg by mouth daily.     Yes Historical Provider, MD    No Known Allergies   Physical Exam:  General - No distress, wearing oxygen, in wheelchair  ENT - No sinus tenderness, no oral exudate,  no LAN Cardiac - s1s2 regular, no murmur Chest - min bilateral insp / exp rhonchi Back - No focal tenderness Abd - Soft, non-tender Ext - deformities of rheumatoid arthritis Neuro - Normal strength Skin - No rashes Psych - normal mood, and behavior   Assessment/Plan:

## 2013-04-07 NOTE — Patient Instructions (Addendum)
Zpak called in  Prednisone 20 x 3 days, 10 x 3 days and 5 mg daily therefore Pantoprazole 40 mg Take 30- 60 min before your first and last meals of the day  For cough > mucinex dm 1200 mg every 12 hours and supplement with oxycodone up to every 4 hours   GERD (REFLUX)  is an extremely common cause of respiratory symptoms, many times with no significant heartburn at all.    It can be treated with medication, but also with lifestyle changes including avoidance of late meals, excessive alcohol, smoking cessation, and avoid fatty foods, chocolate, peppermint, colas, red wine, and acidic juices such as orange juice.  NO MINT OR MENTHOL PRODUCTS SO NO COUGH DROPS  USE SUGARLESS CANDY INSTEAD (jolley ranchers or Stover's)  NO OIL BASED VITAMINS - use powdered substitutes.

## 2013-04-08 ENCOUNTER — Telehealth: Payer: Self-pay | Admitting: Pulmonary Disease

## 2013-04-08 MED ORDER — PREDNISONE 5 MG PO TABS: 5.0000 mg | ORAL_TABLET | Freq: Every day | ORAL | Status: DC

## 2013-04-08 NOTE — Telephone Encounter (Signed)
Pt seen yesterday with MW. Algis Downs to take Pred 5mg  taper--pt had rx at home and told Dr Sherene Sires that he did not need a refill of pred bc he thought that he had enough to last him for this taper Rx. Pt is calling needing this additional refill d/t almost being out of Rx--pt had less pills than he thought.  Pt told to take 20mg  x 3 days, 10mg  x 3 days then 5 mg daily thereafter  Okay to give new rx for pred? Is #100 okay to give to cover the taper and the 5mg  daily dose? Please advise Dr Sherene Sires thanks.

## 2013-04-08 NOTE — Telephone Encounter (Signed)
Yes that's fine 

## 2013-04-08 NOTE — Telephone Encounter (Signed)
pred called into pharm--[facility] [place] Pred 5mg  tabs #100  Take 20mg  x 3 days, then 10mg  x 3 days then 5mg  to continue. X 0 refills Pt aware this has been called in.

## 2013-04-09 ENCOUNTER — Ambulatory Visit (INDEPENDENT_AMBULATORY_CARE_PROVIDER_SITE_OTHER): Payer: BC Managed Care – PPO | Admitting: Internal Medicine

## 2013-04-09 ENCOUNTER — Encounter: Payer: Self-pay | Admitting: Internal Medicine

## 2013-04-09 ENCOUNTER — Ambulatory Visit (INDEPENDENT_AMBULATORY_CARE_PROVIDER_SITE_OTHER)
Admission: RE | Admit: 2013-04-09 | Discharge: 2013-04-09 | Disposition: A | Discharge status: Home / Self Care | Payer: BC Managed Care – PPO | Source: Ambulatory Visit | Attending: Internal Medicine | Admitting: Internal Medicine

## 2013-04-09 VITALS — BP 118/76 | HR 82 | Temp 97.2°F | Ht 73.0 in | Wt 253.0 lb

## 2013-04-09 DIAGNOSIS — J439 Emphysema, unspecified: Secondary | ICD-10-CM

## 2013-04-09 DIAGNOSIS — J438 Other emphysema: Secondary | ICD-10-CM

## 2013-04-09 MED ORDER — LEVOFLOXACIN 750 MG PO TABS: 750.0000 mg | ORAL_TABLET | Freq: Every day | ORAL | Status: DC

## 2013-04-09 NOTE — Progress Notes (Signed)
CC: [name], [name]  History of Present Illness: Joseph [name] is a 62 y.o. male former smoker with COPD, rheumatoid arthritis, recurrent pneumonia 2nd to chronic aspiration/reflux, and chronic respiratory failure on 3 liters oxygen 24/7.  He was in hospital in February for pneumonia with concern for aspiration.  He has previously been followed by Dr. Maple Hudson.  His breathing is doing better.  He still has occasional cough with clear to yellow sputum.  He is not having wheeze, or chest pain.  He uses oxygen 24/7.  He uses advair and spiriva, and these help.  He has not needed to use albuterol recently.  He is on MTX and prednisone through rheumatology from [facility].  His reflux has been doing better.  TESTS: PFT 01/12/09 >> FEV1 3.02 (85%), FEV1% 68, TLC 6.35 (88%), DLCO 45%, no BD CT chest 10/17/12 >> severe centrilobular emphysema, apical bullae, 1 cm nodule LUL new, 5 mm nodule RLL stable since 2009, BTX with GGO at bases    04/07/2013  Acute w/in  ov/Carmelite Violet  Prednisone 5 mg daily and pantoprazole 40 mg  Ac and qhs  Chief Complaint  Patient presents with  . Acute Visit    Pt c/o increased congestion x 4 days- using flutter valve and not producing much sputum- cream colored sputum. He has also noticed slight increase in SOB since cough statrted.   some discomfort with coughing paroxyms in center of chest anteriorly, min sob, min increase in baseline saba need, no rigors. rec Zpak called in  Prednisone 20 x 3 days, 10 x 3 days and 5 mg daily therefore Pantoprazole 40 mg Take 30- 60 min before your first and last meals of the day  For cough > mucinex dm 1200 mg every 12 hours and supplement with oxycodone up to every 4 hours  GERD diet   04/09/2013 f/u ov/Shacara Cozine re refractory cough did not use oxycodone as rx Chief Complaint  Patient presents with  . Follow-up    Pt reports cough is "deeper and hurts more" also having increased SOB since last visit 04/08/13.   unless coughing no  more sob than baseline, no saba need, no rigors, min purulent sputum, cp is ant and midline and only with cough, which is more day than night.   No obvious daytime variabilty or assoc chest tightness, subjective wheeze overt sinus or hb symptoms. No unusual exp hx or h/o childhood pna/ asthma or knowledge of premature birth.   Current Medications, Allergies, Past Medical History, Past Surgical History, Family History, and Social History were reviewed in [employer] record.  ROS  The following are not active complaints unless bolded sore throat, dysphagia, dental problems, itching, sneezing,  nasal congestion or excess/ purulent secretions, ear ache,   fever, chills, sweats, unintended wt loss,lateralizing pleuritic or exertional cp, hemoptysis,  orthopnea pnd or leg swelling, presyncope, palpitations, heartburn, abdominal pain, anorexia, nausea, vomiting, diarrhea  or change in bowel or urinary habits, change in stools or urine, dysuria,hematuria,  rash, arthralgias, visual complaints, headache, numbness weakness or ataxia or problems with walking or coordination,  change in mood/affect or memory.          Physical Exam:  General - No distress, hoarse amb wm wearing oxygen, amb, mod pseudowheeze ENT - No sinus tenderness, no oral exudate, no LAN Cardiac - s1s2 regular, no murmur Chest - min bilateral insp / exp rhonchi Back - No focal tenderness Abd - Soft, non-tender Ext - deformities of rheumatoid arthritis  Neuro - Normal strength Skin - No rashes Psych - normal mood, and behavior  CXR  04/09/2013 :  Stable. No acute cardiopulmonary abnormality.      Assessment/Plan:

## 2013-04-09 NOTE — Patient Instructions (Addendum)
Levaquin called in 750 mg daily x 5 days Prednisone 20 mg daily until better,  10 x 3 days and 5 mg daily therefore Pantoprazole 40 mg Take 30- 60 min before your first and last meals of the day  For cough > mucinex dm 1200 mg every 12 hours and supplement with oxycodone up to every 4 hours and use your flutter valve Stop advair and just use proaire as needed for short of breath  Please remember to go to the   x-ray department downstairs for your tests - we will call you with the results when they are available.

## 2013-04-09 NOTE — Assessment & Plan Note (Signed)
Adequate control on present rx, reviewed > no change in rx needed   

## 2013-04-09 NOTE — Assessment & Plan Note (Signed)
Mild exac with purulent tracheobronchitis but not resp distress or worse sats on baseline 02  rec zpack and change ppi to take it bid ac  Add max dose mucinex to his flutter valve regimen    Each maintenance medication was reviewed in detail including most importantly the difference between maintenance and as needed and under what circumstances the prns are to be used.  Please see instructions for details which were reviewed in writing and the patient given a copy.

## 2013-04-10 NOTE — Assessment & Plan Note (Signed)
DDX of  difficult airways managment all start with A and  include Adherence, Ace Inhibitors, Acid Reflux, Active Sinus Disease, Alpha 1 Antitripsin deficiency, Anxiety masquerading as Airways dz,  ABPA,  allergy(esp in young), Aspiration (esp in elderly), Adverse effects of DPI,  Active smokers, plus two Bs  = Bronchiectasis and Beta blocker use..and one C= CHF   Adherence is always the initial "prime suspect" and is a multilayered concern that requires a "trust but verify" approach in every patient - starting with knowing how to use medications, especially inhalers, correctly, keeping up with refills and understanding the fundamental difference between maintenance and prns vs those medications only taken for a very short course and then stopped and not refilled. He did not follow prev recs to use hydrocodone to suppress violent coughing and now has mscp from cough and likely also creating greater GERD from pressure created by abd exp muscles  ? Acid or non acid GERd > max rx  ? Adverse effect of advair > try off for now and just use prn saba for sob, not cough  Strongly doubt sinusitis or unaddressed bacterial bronchitis but since he is immunocompromised change zpak to levaquin x 5 days

## 2013-04-11 ENCOUNTER — Telehealth: Payer: Self-pay | Admitting: Pulmonary Disease

## 2013-04-11 MED ORDER — FLUTICASONE-SALMETEROL 100-50 MCG/DOSE IN AEPB: 1.0000 | INHALATION_SPRAY | Freq: Two times a day (BID) | RESPIRATORY_TRACT | Status: DC

## 2013-04-11 NOTE — Telephone Encounter (Signed)
Pt called c/o congestion.  He has known mild copd, significant emphysema radiographically, and h/o recurrent pna due to aspiration.  Saw dr. Sherene Sires 2 days ago and given levaquin and prednisone taper.  Better, but still with cough and congestion.  His advair was stopped, but pt thinks he did better with it.  He is using flutter valve, but not taking mucinex consistently.  He denies respiratory distress, but is calling because he though the cough and congestion would go away quickly.  I have explained this may take some time.  Recommend: 1) restart advair.  Will send in for him 2) use flutter, mucinex regularly 3) call office in am and get f/u visit with Dr. Craige Cotta.  4) call us back if symptoms worsening.

## 2013-04-12 ENCOUNTER — Other Ambulatory Visit: Payer: Self-pay | Admitting: Internal Medicine

## 2013-04-13 ENCOUNTER — Telehealth: Payer: Self-pay | Admitting: Internal Medicine

## 2013-04-13 ENCOUNTER — Telehealth: Payer: Self-pay | Admitting: Pulmonary Disease

## 2013-04-13 NOTE — Telephone Encounter (Signed)
Ok to add on today or tomorrow but bring all meds in hand including inhalers, solutions etc but I have nothing else to suggest over the phone

## 2013-04-13 NOTE — Telephone Encounter (Signed)
Pt called back & states that pt is not a patient of Dr. Craige Cotta, no longer a pt of DR. Young.  Antionette Fairy

## 2013-04-13 NOTE — Telephone Encounter (Deleted)
Pt is asking to speak w/ VS or his nurse & can be reached at [phone].  [employer] is confused about stomach meds prescribed.  Pharmacy is [facility] in [place].  Antionette Fairy

## 2013-04-13 NOTE — Telephone Encounter (Signed)
Spoke with pt and given appt tomorrow with Dr Sherene Sires.

## 2013-04-13 NOTE — Telephone Encounter (Signed)
Spoke with Patient--states that [employer] needs verification of medication.   Spoke with Pharmacy--pt misunderstood [employer]--was unaware that insurance company was needing PA Pt states that his insurance is needing PA on medication.  Needs to contact [employer] Pantoprazole 2 tabs daily #60. Will need to call in the morning to do PA.

## 2013-04-13 NOTE — Telephone Encounter (Signed)
I have spoken to patient's pharmacy and they do already have a script on hold from 03/10/13 with 2 refills from Dr Craige Cotta. I have asked them to fill that prescription and have advised patient.

## 2013-04-13 NOTE — Telephone Encounter (Signed)
Last OV on 04-09-13. I spoke with the pt and he states he took his last levaquin 750mg  today,and he is still taking prednisone 20mg  daily. Pt states that he continues to cough and this morning when he coughed it was productive with dark pink tinged mucus. He states he did this twice today. He states he feels like his chest congestion is improved but he feels the cough is a "deeper" cough. He states he also still has a lot of head congestion. He is using mucinex and oxycodone as well as flutter valve. Pt states he is getting very fatigued due to all the coughing. Pt states in some ways he feels like he is improving, but in other ways he feels like he is worse. Please advise. Carron Curie, CMA

## 2013-04-14 ENCOUNTER — Ambulatory Visit: Payer: BC Managed Care – PPO | Admitting: Internal Medicine

## 2013-04-14 NOTE — Telephone Encounter (Signed)
I called the pharmacy d/t no PA # provided. I was giving [id number] Pt protonix was approved from 01/13/13-02/03/14 prior phone note. I called # giving. I was advised it needs a PA for the quantity exception. D/t pt old insurance expired on 03/25/13 and he picked up new policy this is reason he needs a new PA.  This was approved from 03/24/13-04/14/2014. We will receive approval by mail and pt will also be notified by phone. Case ID# [id number] I called and made pt aware and the pharmacy. Nothing further needed

## 2013-04-15 ENCOUNTER — Telehealth: Payer: Self-pay | Admitting: Pulmonary Disease

## 2013-04-15 MED ORDER — TIOTROPIUM BROMIDE MONOHYDRATE 18 MCG IN CAPS: 18.0000 ug | ORAL_CAPSULE | Freq: Every day | RESPIRATORY_TRACT | Status: DC

## 2013-04-15 NOTE — Telephone Encounter (Signed)
rx for the spiriva has been sent to the pharmacy and the pt is aware.

## 2013-04-16 ENCOUNTER — Other Ambulatory Visit: Payer: Self-pay | Admitting: Internal Medicine

## 2013-04-16 MED ORDER — PANTOPRAZOLE SODIUM 40 MG PO TBEC: 40.0000 mg | DELAYED_RELEASE_TABLET | Freq: Two times a day (BID) | ORAL | Status: DC

## 2013-05-05 ENCOUNTER — Ambulatory Visit (INDEPENDENT_AMBULATORY_CARE_PROVIDER_SITE_OTHER): Payer: BC Managed Care – PPO | Admitting: Pulmonary Disease

## 2013-05-05 ENCOUNTER — Other Ambulatory Visit: Payer: Self-pay

## 2013-05-05 ENCOUNTER — Encounter: Payer: Self-pay | Admitting: Pulmonary Disease

## 2013-05-05 VITALS — BP 110/72 | HR 80 | Ht 73.0 in | Wt 254.0 lb

## 2013-05-05 DIAGNOSIS — M069 Rheumatoid arthritis, unspecified: Secondary | ICD-10-CM

## 2013-05-05 DIAGNOSIS — R911 Solitary pulmonary nodule: Secondary | ICD-10-CM

## 2013-05-05 DIAGNOSIS — J439 Emphysema, unspecified: Secondary | ICD-10-CM

## 2013-05-05 DIAGNOSIS — R918 Other nonspecific abnormal finding of lung field: Secondary | ICD-10-CM

## 2013-05-05 DIAGNOSIS — J9610 Chronic respiratory failure, unspecified whether with hypoxia or hypercapnia: Secondary | ICD-10-CM

## 2013-05-05 DIAGNOSIS — J479 Bronchiectasis, uncomplicated: Secondary | ICD-10-CM

## 2013-05-05 DIAGNOSIS — J438 Other emphysema: Secondary | ICD-10-CM

## 2013-05-05 DIAGNOSIS — J189 Pneumonia, unspecified organism: Secondary | ICD-10-CM

## 2013-05-05 MED ORDER — POTASSIUM CHLORIDE CRYS ER 20 MEQ PO TBCR: 40.0000 meq | EXTENDED_RELEASE_TABLET | Freq: Two times a day (BID) | ORAL | Status: DC

## 2013-05-05 NOTE — Assessment & Plan Note (Signed)
Related to recurrent respiratory infections in setting of chronic aspiration and rheumatoid arthritis.  Continue current pulmonary hygiene regimen. 

## 2013-05-05 NOTE — Assessment & Plan Note (Signed)
He has recovered from recent exacerbation.  He is to continue spiriva, advair, and prn albuterol.

## 2013-05-05 NOTE — Patient Instructions (Signed)
Flu shot today Will schedule CT chest and call with results  Follow up in 6 months 

## 2013-05-05 NOTE — Assessment & Plan Note (Signed)
He is on chronic prednisone and MTX per rheumatology at [facility].     

## 2013-05-05 NOTE — Assessment & Plan Note (Signed)
He is followed by Dr. Brodie for his reflux. 

## 2013-05-05 NOTE — Assessment & Plan Note (Signed)
Continue 3 liters oxygen 24/7. 

## 2013-05-05 NOTE — Progress Notes (Signed)
Chief Complaint  Patient presents with  . COPD    Breathing is unchanged. Reports SOB, chest tightness and wheezing. Denies coughing.    CC: Joseph [name], Joseph [name]  History of Present Illness: Joseph [name] is a 62 y.o. male former smoker with COPD/emphysema, rheumatoid arthritis, recurrent pneumonia 2nd to chronic aspiration/reflux, bronchiectasis, chronic respiratory failure on 3 liters oxygen 24/7, and pulmonary nodules.  He was treated for exacerbation in August.  He has improved.  He has finished antibiotics.  He still has cough, but not as much sputum.  His phlegm is more light green in color.  He has been using incentive spirometry and flutter valve, and feels these have helped.  He is not needing to use albuterol as much as before.  He felt worse when advair was stopped.   TESTS: PFT 01/12/09 >> FEV1 3.02 (85%), FEV1% 68, TLC 6.35 (88%), DLCO 45%, no BD CT chest 10/17/12 >> severe centrilobular emphysema, apical bullae, 1 cm nodule LUL new, 5 mm nodule RLL stable since 2009, BTX with GGO at bases PFT 12/02/12 >> FEV1 3.11 (78%), FEV1% 77, TLC 6.32 (83%), DLCO 37%, no BD  Joseph [name]  has a past medical history of Rheumatoid arthritis(714.0); Psoriasis; Hypertension; Gastroesophageal reflux disease; COPD (chronic obstructive pulmonary disease); CAD (coronary artery disease); Osteoporosis; Peripheral polyneuropathy; Diabetes mellitus; CHF (congestive heart failure); Onychomycosis; PONV (postoperative nausea and vomiting); Hiatal hernia; Esophageal dysmotility; Gastroparesis; Fatty liver (10/11/09); Pulmonary fibrosis; and Neuropathy.  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  Prior to Admission medications   Medication Sig Start Date End Date Taking? Authorizing Provider  albuterol  (PROVENTIL,VENTOLIN) 90 MCG/ACT inhaler Inhale 2 puffs into the lungs every 4 (four) hours as needed for wheezing or shortness of breath. 04/09/11  Yes Waymon Budge, MD  amitriptyline (ELAVIL) 75 MG tablet Take 75 mg by mouth 3 (three) times daily.    Yes Historical Provider, MD  Ascorbic Acid (VITAMIN C) 500 MG tablet Take 500 mg by mouth daily.     Yes Historical Provider, MD  aspirin 81 MG tablet Take 81 mg by mouth daily.     Yes Historical Provider, MD  atorvastatin (LIPITOR) 80 MG tablet Take 1 tablet (80 mg total) by mouth daily. 02/18/12  Yes Rollene Rotunda, MD  calamine ([facility] CALAMINE) lotion     Yes Historical Provider, MD  Carbonyl Iron (PERFECT IRON PO) Take by mouth. 1 tab daily    Yes Historical Provider, MD  Fluticasone-Salmeterol (ADVAIR DISKUS) 100-50 MCG/DOSE AEPB 1 puff then rinse mouth, twice daily 03/16/12 03/16/13 Yes Clinton D Young, MD  FOLIC ACID PO Take 100 mcg by mouth daily.   Yes Historical Provider, MD  furosemide (LASIX) 20 MG tablet Take 60 mg by mouth daily. 2 in the morning and 1 in the afternoon 02/18/12  Yes Rollene Rotunda, MD  glucose blood (ONE TOUCH ULTRA TEST) test strip USE AS DIRECTED 11/04/12  Yes Wanda Plump, MD  Lancets [facility] ULTRASOFT) lancets Use as instructed 12/23/11 12/22/12 Yes Wanda Plump, MD  methotrexate (RHEUMATREX) 2.5 MG tablet Take 8 tablets every week 10/29/12  Yes Jeanella Craze, NP  metoprolol tartrate (LOPRESSOR) 25 MG tablet Take 1 tablet (25 mg total) by mouth 2 (two) times daily. 02/18/12  Yes Rollene Rotunda, MD  morphine (MS CONTIN) 15 MG 12 hr tablet Take 15  mg by mouth 2 (two) times daily as needed for pain.   Yes Historical Provider, MD  nitroGLYCERIN (NITROSTAT) 0.4 MG SL tablet Place 1 tablet (0.4 mg total) under the tongue every 5 (five) minutes as needed for chest pain. 02/18/12  Yes Rollene Rotunda, MD  NON FORMULARY EVERGO PORTABLE PULSE DOSE OXYGEN oxygen 3L per nasal cannula 24 hours a day Dx: 496   Yes Historical Provider, MD   nystatin (MYCOSTATIN) 100000 UNIT/ML suspension Take 5 mLs (500,000 Units total) by mouth 4 (four) times daily. 10/27/12  Yes Waymon Budge, MD  oxyCODONE-acetaminophen (PERCOCET) 7.5-325 MG per tablet Take 1 tablet by mouth. 3 times a day   Yes Historical Provider, MD  pantoprazole (PROTONIX) 40 MG tablet Take 1 tablet (40 mg total) by mouth 2 (two) times daily. 10/21/12  Yes Jeanella Craze, NP  potassium chloride SA (K-DUR,KLOR-CON) 20 MEQ tablet 2 by mouth in the morning and 1 by mouth at 4 pm 02/18/12  Yes Rollene Rotunda, MD  predniSONE (STERAPRED UNI-PAK) 10 MG tablet Take by mouth daily. TAKES 6 MG AM . 10/21/12  Yes Jeanella Craze, NP  pregabalin (LYRICA) 150 MG capsule Take 150 mg by mouth 3 (three) times daily.     Yes Historical Provider, MD  Respiratory Therapy Supplies (FLUTTER) DEVI Blow through several times, 2 or 3 sets per day as needed 09/16/11  Yes Waymon Budge, MD  tiotropium (SPIRIVA HANDIHALER) 18 MCG inhalation capsule Place 1 capsule (18 mcg total) into inhaler and inhale daily. 1 puff in AM 03/16/12 03/16/13 Yes Waymon Budge, MD  UNABLE TO FIND Med Name: Incentive Spirometer Use as directed 08/01/11  Yes Waymon Budge, MD  vitamin B-12 (CYANOCOBALAMIN) 250 MCG tablet Take 250 mcg by mouth daily.     Yes Historical Provider, MD    No Known Allergies   Physical Exam:  General - No distress, wearing oxygen ENT - No sinus tenderness, no oral exudate, no LAN Cardiac - s1s2 regular, no murmur Chest - decreased breath sounds, no wheeze/rales Back - No focal tenderness Abd - Soft, non-tender Ext - deformities of rheumatoid arthritis Neuro - Normal strength Skin - No rashes Psych - normal mood, and behavior   Assessment/Plan:  Joseph Helling, MD [place] [facility] Pager:  [phone]

## 2013-05-05 NOTE — Assessment & Plan Note (Addendum)
Will arrange for repeat CT chest without contrast to monitor stability of nodules.  Will call him with results.

## 2013-05-06 ENCOUNTER — Ambulatory Visit (INDEPENDENT_AMBULATORY_CARE_PROVIDER_SITE_OTHER)
Admission: RE | Admit: 2013-05-06 | Discharge: 2013-05-06 | Disposition: A | Discharge status: Home / Self Care | Payer: BC Managed Care – PPO | Source: Ambulatory Visit | Attending: Pulmonary Disease | Admitting: Pulmonary Disease

## 2013-05-06 DIAGNOSIS — R911 Solitary pulmonary nodule: Secondary | ICD-10-CM

## 2013-05-10 ENCOUNTER — Telehealth: Payer: Self-pay | Admitting: Pulmonary Disease

## 2013-05-10 NOTE — Telephone Encounter (Signed)
Spoke with [employer]. Stated that they need the pt's qualifying sats. When the pt was here last we did not walk him.  I will call the pt and let him know that we will need him to come in so that we can walk him. Pt states that he will try to come by tomorrow to do the walk. Nothing further was needed.

## 2013-05-11 ENCOUNTER — Telehealth: Payer: Self-pay | Admitting: Pulmonary Disease

## 2013-05-11 ENCOUNTER — Ambulatory Visit: Payer: BC Managed Care – PPO

## 2013-05-11 NOTE — Telephone Encounter (Signed)
Ct Chest Wo Contrast  05/06/2013    CLINICAL DATA:  Followup evaluation of pulmonary nodules.   EXAM: CT CHEST WITHOUT CONTRAST   TECHNIQUE: Multidetector CT imaging of the chest was performed following the standard protocol without IV contrast.   COMPARISON:  Chest CT 10/17/2012.  FINDINGS:  Mediastinum:  Heart size is normal. There is no significant pericardial fluid, thickening or pericardial calcification. There is atherosclerosis of the thoracic aorta, the great vessels of the mediastinum and the coronary arteries, including calcified atherosclerotic plaque in the left main, left anterior descending, left circumflex and right coronary arteries. Status post median sternotomy for CABG, including LIMA to the LAD. No pathologically enlarged mediastinal or hilar lymph nodes. Please note that accurate exclusion of hilar adenopathy is limited on noncontrast CT scans. Esophagus is unremarkable in appearance.   Lungs/Pleura:  Compared to the prior study, the previously noted left upper lobe nodule appears slightly smaller on today's study, currently measuring only 8 mm (image 16 of series 3). Other previously noted nodules measure 4 mm in the right lower lobe (image 41 of series 3) and 3 mm in the subpleural aspect of the lateral segment of the right middle lobe (image 41 of series 3), unchanged within measurement error compared to the prior study. In addition, there is an 8 mm nodule in the inferior segment of the lingula (image 60 of series 6) which is unchanged, and a 7 mm nodule in the medial segment of the right middle lobe (image 35 of series 3) which is only slightly larger than remote prior examination 11/30/2007. No other new or enlarging suspicious appearing pulmonary nodule or mass is otherwise identified. No acute consolidative airspace disease. No pleural effusions. Moderate centrilobular and paraseptal emphysema, most pronounced about the lung apices. Previously noted ground-glass attenuation  throughout the lung bases bilaterally has significantly improved and nearly completely resolved compared to the prior study, indicative of an acute process on the prior study, rather than underlying interstitial lung disease.   Upper Abdomen:  Diffuse decreased attenuation throughout the hepatic parenchyma, compatible with hepatic steatosis. Status post cholecystectomy.   Musculoskeletal:  There are no aggressive appearing lytic or blastic lesions noted in the visualized portions of the skeleton. Status post sternotomy.   IMPRESSION:  1. Previously noted pulmonary nodules appear stable compared to the prior study or slightly decreased in size, suggesting benign etiologies. However, given the smoking related changes in the lungs bilaterally, the patient is at high risk for bronchogenic carcinoma, and continued attention on followup studies is recommended in until a full 2 years of stability has been documented.  2. Moderate centrilobular and paraseptal emphysema are redemonstrated.  3. Resolution of previously noted bibasilar ground-glass attenuation, indicative of an infectious or inflammatory process on the prior study.  4. Hepatic steatosis.  5. Status post cholecystectomy.  6. Atherosclerosis, including left main and 3 vessel coronary artery disease. Status post median sternotomy for CABG including LIMA to the LAD.    Electronically Signed   By: Trudie Reed M.D.   On: 05/06/2013 16:29   Results d/w pt over the phone.

## 2013-05-13 ENCOUNTER — Encounter: Payer: Self-pay | Admitting: Cardiology

## 2013-05-13 ENCOUNTER — Ambulatory Visit (INDEPENDENT_AMBULATORY_CARE_PROVIDER_SITE_OTHER): Payer: BC Managed Care – PPO | Admitting: Cardiology

## 2013-05-13 VITALS — BP 112/62 | HR 75 | Ht 73.0 in | Wt 252.4 lb

## 2013-05-13 DIAGNOSIS — I2510 Atherosclerotic heart disease of native coronary artery without angina pectoris: Secondary | ICD-10-CM

## 2013-05-13 DIAGNOSIS — I10 Essential (primary) hypertension: Secondary | ICD-10-CM

## 2013-05-13 DIAGNOSIS — Z0000 Encounter for general adult medical examination without abnormal findings: Secondary | ICD-10-CM

## 2013-05-13 NOTE — Patient Instructions (Addendum)
The current medical regimen is effective;  continue present plan and medications.  Follow up in 6 months with Dr Hochrein.  You will receive a letter in the mail 2 months before you are due.  Please call us when you receive this letter to schedule your follow up appointment.  

## 2013-05-13 NOTE — Progress Notes (Signed)
HPI The patient presents follow up of CAD with bypass in 2007. Over the past year he's done well from a cardiovascular standpoint. He seems to finally be over some foot surgeries that he's been having for his diabetic feet. He's able to do a little more walking. The patient denies any new symptoms such as chest discomfort, neck or arm discomfort. There has been no new shortness of breath, PND or orthopnea. There have been no reported palpitations, presyncope or syncope.    No Known Allergies  Current Outpatient Prescriptions  Medication Sig Dispense Refill  . albuterol (PROVENTIL,VENTOLIN) 90 MCG/ACT inhaler Inhale 2 puffs into the lungs every 4 (four) hours as needed for wheezing or shortness of breath.  17 g  prn  . amitriptyline (ELAVIL) 75 MG tablet Take 75 mg by mouth 3 (three) times daily.       . Ascorbic Acid (VITAMIN C) 500 MG tablet Take 500 mg by mouth daily.        Marland Kitchen aspirin 81 MG tablet Take 81 mg by mouth daily.        Marland Kitchen atorvastatin (LIPITOR) 80 MG tablet Take 1 tablet (80 mg total) by mouth daily.  30 tablet  11  . Carbonyl Iron (PERFECT IRON PO) Take by mouth. 1 tab daily       . Fluticasone-Salmeterol (ADVAIR DISKUS) 100-50 MCG/DOSE AEPB Inhale 1 puff into the lungs 2 (two) times daily.  60 each  6  . FOLIC ACID PO Take 100 mcg by mouth daily.      . furosemide (LASIX) 20 MG tablet 2 in the morning and 1 in the afternoon      . glucose blood (ONE TOUCH ULTRA TEST) test strip USE AS DIRECTED  50 each  0  . Lancets (ONETOUCH ULTRASOFT) lancets USE AS DIRECTED  100 each  3  . methotrexate (RHEUMATREX) 2.5 MG tablet 8 tablets every Thursday      . metoprolol tartrate (LOPRESSOR) 25 MG tablet Take 1 tablet (25 mg total) by mouth 2 (two) times daily.  180 tablet  0  . nitroGLYCERIN (NITROSTAT) 0.4 MG SL tablet Place 1 tablet (0.4 mg total) under the tongue every 5 (five) minutes as needed for chest pain.  25 tablet  11  . NON FORMULARY EVERGO PORTABLE PULSE DOSE OXYGEN oxygen 3L  per nasal cannula 24 hours a day Dx: 496      . oxyCODONE-acetaminophen (PERCOCET) 7.5-325 MG per tablet Take 1 tablet by mouth. 3 times a day      . pantoprazole (PROTONIX) 40 MG tablet Take 1 tablet (40 mg total) by mouth 2 (two) times daily.  60 tablet  6  . potassium chloride SA (K-DUR,KLOR-CON) 20 MEQ tablet Take 2 tablets (40 mEq total) by mouth 2 (two) times daily. 2 by mouth in the morning and 2 by mouth at 4 pm  36 tablet  0  . predniSONE (DELTASONE) 5 MG tablet Take 5 mg by mouth daily.      . pregabalin (LYRICA) 150 MG capsule Take 150 mg by mouth 3 (three) times daily.        Marland Kitchen Respiratory Therapy Supplies (FLUTTER) DEVI Blow through several times, 2 or 3 sets per day as needed  1 each  0  . tiotropium (SPIRIVA HANDIHALER) 18 MCG inhalation capsule Place 1 capsule (18 mcg total) into inhaler and inhale daily. 1 puff in AM  30 capsule  6  . UNABLE TO FIND Med Name: Incentive Spirometer Use as  directed  1 Device  0  . vitamin B-12 (CYANOCOBALAMIN) 250 MCG tablet Take 250 mcg by mouth daily.        . [DISCONTINUED] omeprazole (PRILOSEC OTC) 20 MG tablet Take 1 tablet (20 mg total) by mouth daily.  60 tablet  0   No current facility-administered medications for this visit.    Past Medical History  Diagnosis Date  . Rheumatoid arthritis(714.0)   . Psoriasis   . Hypertension   . Gastroesophageal reflux disease   . COPD (chronic obstructive pulmonary disease)   . CAD (coronary artery disease)     MI 2007, CABG  . Osteoporosis     per DEXA 05-2008  . Peripheral polyneuropathy     Severe, causing problems with his feet, see surgeries  . Diabetes mellitus     Dx 07-2008 A1C 6.2  . CHF (congestive heart failure)   . Onychomycosis     toenails  . PONV (postoperative nausea and vomiting)   . Hiatal hernia   . Esophageal dysmotility   . Gastroparesis   . Fatty liver 10/11/09  . Pulmonary fibrosis   . Neuropathy     (related to RA per neuro note 05-2011)    Past Surgical  History  Procedure Laterality Date  . Foot surgery      to tendon release and repair of osteomyelitis   . Cholecystectomy    . Coronary artery bypass graft  2007    (LIMA to the LAD, left radial to obtuse marginal, SVG to first diagonal, SVG to PDA. His ejection fraction to 50-55%  . Achiles tendon surgery  8/09  . Toe amputation  4-12/ 3 14    d/t a deformity, found to have osteomyelitis, complicated by post-op foot strtess FX  . Esophagogastroduodenoscopy N/A 10/19/2012    Procedure: ESOPHAGOGASTRODUODENOSCOPY (EGD);  Surgeon: Hart Carwin, MD;  Location: [facility] ENDOSCOPY;  Service: Endoscopy;  Laterality: N/A;  the dilatation is possible.    ROS   As stated in the HPI and negative for all other systems.   PHYSICAL EXAM BP 112/62  Pulse 75  Ht 6\' 1"  (1.854 m)  Wt 252 lb 6.4 oz (114.488 kg)  BMI 33.31 kg/m2  SpO2 97% General: Well developed, well nourished, in no acute distress.  Head: normocephalic and atraumatic  Eyes: PERRLA/EOM intact; conjunctiva and lids normal.  Mouth: Teeth, gums and palate normal. Oral mucosa normal.  Neck: Neck supple, no JVD. No masses, thyromegaly or abnormal cervical nodes.  Chest Wall: well-healed sternotomy scar  Lungs: Clear bilaterally to auscultation and percussion.  Heart: S1 and S2 within normal limits, no S3, no S4, no clicks, no rubs, no murmurs  Abdomen: Bowel sounds positive; abdomen soft and non-tender without masses, organomegaly, or hernias noted. No hepatosplenomegaly, obese  Msk: severe rheumatoid changes  Extremities: trace bilateral edema, his legs are in diabetic boots and I was unable to appreciate posterior tibialis or dorsalis pulses  Neurologic: Alert and oriented x 3.  Skin: Intact without lesions or rashes.  Psych: Normal affect.  EKG:  Sinus rhythm, rate 69, axis within normal limits, intervals within normal limits, no acute ST-T wave changes.  05/13/2013   ASSESSMENT AND PLAN  CAD:  The patient has no new sypmtoms.  No  further cardiovascular testing is indicated.  We will continue with aggressive risk reduction and meds as listed. He has had no symptoms since HE IS last stress test.  HTN:  His blood pressure is well controlled. He will continue  the meds as listed.  DYSLIPIDEMIA:  His lipid profile is excellent. He will continue the meds as listed.

## 2013-06-01 ENCOUNTER — Other Ambulatory Visit: Payer: Self-pay

## 2013-06-01 MED ORDER — METOPROLOL TARTRATE 25 MG PO TABS: 25.0000 mg | ORAL_TABLET | Freq: Two times a day (BID) | ORAL | Status: DC

## 2013-06-08 ENCOUNTER — Other Ambulatory Visit: Payer: Self-pay

## 2013-06-08 MED ORDER — FUROSEMIDE 20 MG PO TABS: ORAL_TABLET | ORAL | Status: DC

## 2013-06-11 ENCOUNTER — Other Ambulatory Visit: Payer: Self-pay | Admitting: Pulmonary Disease

## 2013-06-15 ENCOUNTER — Other Ambulatory Visit: Payer: Self-pay

## 2013-06-15 MED ORDER — POTASSIUM CHLORIDE CRYS ER 20 MEQ PO TBCR: 40.0000 meq | EXTENDED_RELEASE_TABLET | Freq: Two times a day (BID) | ORAL | Status: DC

## 2013-06-16 ENCOUNTER — Other Ambulatory Visit: Payer: Self-pay | Admitting: *Deleted

## 2013-06-17 ENCOUNTER — Telehealth: Payer: Self-pay | Admitting: Internal Medicine

## 2013-06-17 ENCOUNTER — Other Ambulatory Visit: Payer: Self-pay | Admitting: *Deleted

## 2013-06-17 DIAGNOSIS — E785 Hyperlipidemia, unspecified: Secondary | ICD-10-CM

## 2013-06-17 MED ORDER — POTASSIUM CHLORIDE CRYS ER 20 MEQ PO TBCR: 40.0000 meq | EXTENDED_RELEASE_TABLET | Freq: Two times a day (BID) | ORAL | Status: DC

## 2013-06-17 NOTE — Telephone Encounter (Signed)
Hepatic labs ordered

## 2013-06-17 NOTE — Telephone Encounter (Signed)
Patient called and schedule a lab apt for testing his liver function stated that it was a standing order. Please advise me with order. Patient is schedule to come in on Tuesday October 28 th at 10:00am

## 2013-06-22 ENCOUNTER — Other Ambulatory Visit (INDEPENDENT_AMBULATORY_CARE_PROVIDER_SITE_OTHER): Payer: BC Managed Care – PPO

## 2013-06-22 DIAGNOSIS — M069 Rheumatoid arthritis, unspecified: Secondary | ICD-10-CM

## 2013-06-22 LAB — CBC WITH DIFFERENTIAL/PLATELET
Eosinophils Absolute: 0.2 10*3/uL (ref 0.0–0.7)
Eosinophils Relative: 2.5 % (ref 0.0–5.0)
Lymphocytes Relative: 9.5 % — ABNORMAL LOW (ref 12.0–46.0)
MCHC: 33.7 g/dL (ref 30.0–36.0)
MCV: 93.2 fl (ref 78.0–100.0)
Monocytes Absolute: 0.7 10*3/uL (ref 0.1–1.0)
Neutrophils Relative %: 80.3 % — ABNORMAL HIGH (ref 43.0–77.0)
Platelets: 227 10*3/uL (ref 150.0–400.0)
WBC: 9.6 10*3/uL (ref 4.5–10.5)

## 2013-06-22 LAB — CREATININE, SERUM: Creatinine, Ser: 0.8 mg/dL (ref 0.4–1.5)

## 2013-06-22 LAB — ALBUMIN: Albumin: 3.7 g/dL (ref 3.5–5.2)

## 2013-06-22 LAB — AST: AST: 29 U/L (ref 0–37)

## 2013-06-23 ENCOUNTER — Ambulatory Visit: Payer: BC Managed Care – PPO

## 2013-06-25 ENCOUNTER — Telehealth: Payer: Self-pay | Admitting: Pulmonary Disease

## 2013-06-25 NOTE — Telephone Encounter (Signed)
Called and spoke with pt and he stated that he owns his own cpap machine and he stated that he was in need of a piece for this.  He has tried DME's here but they are not able to help him without him getting supplies through them.  He stated that the company that he did get his cpap from is out in [place] and he has called and ordered 2 of the pieces that he needed for his machine.  He stated that he will be ok with doing this but wanted to check with [place] to see if we knew where he would be able to buy these locally.  Nothing further is needed.

## 2013-06-28 ENCOUNTER — Ambulatory Visit: Payer: BC Managed Care – PPO

## 2013-07-01 ENCOUNTER — Other Ambulatory Visit: Payer: Self-pay

## 2013-07-09 ENCOUNTER — Encounter: Payer: Self-pay | Admitting: Internal Medicine

## 2013-07-09 ENCOUNTER — Ambulatory Visit (INDEPENDENT_AMBULATORY_CARE_PROVIDER_SITE_OTHER): Payer: BC Managed Care – PPO | Admitting: Internal Medicine

## 2013-07-09 VITALS — BP 125/76 | HR 92 | Temp 98.0°F | Wt 252.0 lb

## 2013-07-09 DIAGNOSIS — Z1211 Encounter for screening for malignant neoplasm of colon: Secondary | ICD-10-CM

## 2013-07-09 DIAGNOSIS — J439 Emphysema, unspecified: Secondary | ICD-10-CM

## 2013-07-09 DIAGNOSIS — E119 Type 2 diabetes mellitus without complications: Secondary | ICD-10-CM

## 2013-07-09 DIAGNOSIS — I10 Essential (primary) hypertension: Secondary | ICD-10-CM

## 2013-07-09 DIAGNOSIS — J438 Other emphysema: Secondary | ICD-10-CM

## 2013-07-09 DIAGNOSIS — Z0000 Encounter for general adult medical examination without abnormal findings: Secondary | ICD-10-CM

## 2013-07-09 NOTE — Assessment & Plan Note (Signed)
Seems well-controlled, up-to-date on all his immunizations

## 2013-07-09 NOTE — Assessment & Plan Note (Signed)
Last A1c is satisfactory

## 2013-07-09 NOTE — Progress Notes (Signed)
Subjective:    Patient ID: Joseph [name], male    DOB: October 23, 1950, 62 y.o.   MRN: [id number]  HPI  Routine office visit, We discussed the following: COPD--good compliance of medication, very rarely uses albuterol Diabetes--CBGs usually around 100 Pain management -- off morphine, has decreased lyrica to 2 daily Hypertension--good medication compliance, ambulatory BPs within normal CAD--recently seen by cardiology, felt to be stable Colon cancer screening--see assessment and plan.  Past Medical History  Diagnosis Date  . Rheumatoid arthritis(714.0)   . Psoriasis   . Hypertension   . Gastroesophageal reflux disease   . COPD (chronic obstructive pulmonary disease)   . CAD (coronary artery disease)     MI 2007, CABG  . Osteoporosis     per DEXA 05-2008  . Peripheral polyneuropathy     Severe, causing problems with his feet, see surgeries  . Diabetes mellitus     Dx 07-2008 A1C 6.2  . CHF (congestive heart failure)   . Onychomycosis     toenails  . PONV (postoperative nausea and vomiting)   . Hiatal hernia   . Esophageal dysmotility   . Gastroparesis   . Fatty liver 10/11/09  . Pulmonary fibrosis   . Neuropathy     (related to RA per neuro note 05-2011)      Past Surgical History  Procedure Laterality Date  . Foot surgery      to tendon release and repair of osteomyelitis   . Cholecystectomy    . Coronary artery bypass graft  2007    (LIMA to the LAD, left radial to obtuse marginal, SVG to first diagonal, SVG to PDA. His ejection fraction to 50-55%  . Achiles tendon surgery  8/09  . Toe amputation  4-12/ 3 14    d/t a deformity, found to have osteomyelitis, complicated by post-op foot strtess FX  . Esophagogastroduodenoscopy N/A 10/19/2012    Procedure: ESOPHAGOGASTRODUODENOSCOPY (EGD);  Surgeon: Hart Carwin, MD;  Location: [facility] ENDOSCOPY;  Service: Endoscopy;  Laterality: N/A;  the dilatation is possible.    History   Social History  . Marital Status: Married   Spouse Name: N/A    Number of Children: 0  . Years of Education: N/A   Occupational History  . disabled-retired    Social History Main Topics  . Smoking status: Former Smoker -- 1.00 packs/day for 40 years    Quit date: 08/26/2005  . Smokeless tobacco: Former Neurosurgeon    Quit date: 04/02/2006     Comment: smoked x 40 years, 1 ppd  . Alcohol Use: No  . Drug Use: Not on file  . Sexual Activity: Not on file   Other Topics Concern  . Not on file   Social History Narrative  . No narrative on file    Review of Systems No  CP, SOB at baseline Denies  nausea, vomiting diarrhea Denies  blood in the stools  No dysuria, gross hematuria, difficulty urinating       Objective:   Physical Exam BP 125/76  Pulse 92  Temp(Src) 98 F (36.7 C)  Wt 252 lb (114.306 kg)  SpO2 96% General -- alert, well-developed, NAD.  Lungs -- normal respiratory effort, no intercostal retractions, no accessory muscle use, and normal breath sounds.  Heart-- normal rate, regular rhythm, no murmur.  Rectal-- No external abnormalities noted. Normal sphincter tone. No rectal masses or tenderness. Brown stool, Hemoccult negative  Prostate--Prostate gland firm and smooth, no enlargement, nodularity, tenderness, mass, asymmetry or induration.  Neurologic--  alert & oriented X3. Speech normal, gait normal, strength normal in all extremities.  Psych-- Cognition and judgment appear intact. Cooperative with normal attention span and concentration. No anxious appearing , no depressed appearing.      Assessment & Plan:

## 2013-07-09 NOTE — Assessment & Plan Note (Addendum)
Although I'm not  doing a physical today we discussed the following: Reluctant to have a colonoscopy, the patient and his wife are afraid of complications b/c is a procedure, I concurr that he is high risk. DRE today showed (-) hemocult. We agreed to do a iFOB and  discuss further discussion with GI Prostate cancer screening: PSA below 1 last year, DRE normal today.  Today , I spent more than 25in with the patient, >50% of the time counseling, and answering multiple questions to the best of my ability

## 2013-07-09 NOTE — Progress Notes (Signed)
Pre visit review using our clinic review tool, if applicable. No additional management support is needed unless otherwise documented below in the visit note. 

## 2013-07-09 NOTE — Patient Instructions (Signed)
Next visit in 4-5 months  for a  diabetes hypertension   follow up (30 minutes). Fasting Please make an appointment

## 2013-07-09 NOTE — Assessment & Plan Note (Signed)
Well-controlled, no change, recent BMP wnl

## 2013-07-19 ENCOUNTER — Other Ambulatory Visit: Payer: Self-pay | Admitting: Internal Medicine

## 2013-08-02 ENCOUNTER — Telehealth: Payer: Self-pay | Admitting: Pulmonary Disease

## 2013-08-02 DIAGNOSIS — J439 Emphysema, unspecified: Secondary | ICD-10-CM

## 2013-08-02 MED ORDER — FLUTTER DEVI: Status: DC

## 2013-08-02 NOTE — Telephone Encounter (Signed)
Spoke with pt.  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   I have placed the Flutter Valve at the front for pick up along with the [facility] form and rx to be signed and have placed an order to have DME provide pt with the Incentive Spirometer.  Pt is aware of this and voiced no further questions or concerns at this time.

## 2013-08-02 NOTE — Telephone Encounter (Signed)
I called and spoke with pt. He reports he uses flutter valve and a machine called voldyne 1500 (inhales from machine to help strengthen his lungs). He reports both of these machines has black substance on the inside of them. He uses these daily and thinks iot could be possibly be mold. He is wanting new ones. Please advise Dr. Craige Cotta if okay thanks

## 2013-08-02 NOTE — Telephone Encounter (Signed)
Okay to send order to replace both.

## 2013-08-03 ENCOUNTER — Telehealth: Payer: Self-pay | Admitting: Pulmonary Disease

## 2013-08-03 DIAGNOSIS — J439 Emphysema, unspecified: Secondary | ICD-10-CM

## 2013-08-03 NOTE — Telephone Encounter (Signed)
Spoke with Okey Regal.  Verified above with her. [facility] does carry the IS.  I spoke with [name].  She will check on this and call back.

## 2013-08-04 NOTE — Telephone Encounter (Signed)
Okay to give office model incentive spirometer.

## 2013-08-04 NOTE — Telephone Encounter (Signed)
I have placed an IS at the front for pt to pick up with the [facility] ticket and IS order.   Pt is aware and was very appreciative of this. He voiced no further questions or concerns at this time.

## 2013-08-04 NOTE — Telephone Encounter (Signed)
[name] returning call.Joseph [name] ° °

## 2013-08-04 NOTE — Telephone Encounter (Signed)
lmtcb x1 for melissa 

## 2013-08-04 NOTE — Telephone Encounter (Signed)
I called and spoke with Joseph [name]. She reports they do not have IS. We have incentive spiro in the office but is not the same model pt has. Dr. Craige Cotta is this okay to give pt? Pt reports the one has has he received from the hospital. thanks

## 2013-08-20 ENCOUNTER — Other Ambulatory Visit: Payer: Self-pay | Admitting: Pulmonary Disease

## 2013-10-13 ENCOUNTER — Telehealth: Payer: Self-pay | Admitting: Internal Medicine

## 2013-10-13 NOTE — Telephone Encounter (Signed)
I received a phone call from Dr C. Bethany [phone]), the patient is requesting oral implants , Dr Jeanice Lim likes my opinion about the surgery, he is concerned about the ability of Doug to heal. He  try to contact the patient's rheumatology unsuccessfully. I called Dr Sharren Bridge rheumatology and couldn't get through the phone , will try again, I think his opinion is paramount in this matter

## 2013-10-25 NOTE — Telephone Encounter (Signed)
Called [phone], unable to get through the lines or leave a message. LMOM for Dr Jeanice Lim, let him know I can not reach  rheumatology

## 2013-11-08 ENCOUNTER — Ambulatory Visit (INDEPENDENT_AMBULATORY_CARE_PROVIDER_SITE_OTHER): Payer: BC Managed Care – PPO | Admitting: Internal Medicine

## 2013-11-08 ENCOUNTER — Encounter: Payer: Self-pay | Admitting: Internal Medicine

## 2013-11-08 VITALS — BP 106/71 | HR 72 | Temp 98.3°F | Wt 248.0 lb

## 2013-11-08 DIAGNOSIS — I10 Essential (primary) hypertension: Secondary | ICD-10-CM

## 2013-11-08 DIAGNOSIS — J438 Other emphysema: Secondary | ICD-10-CM

## 2013-11-08 DIAGNOSIS — R490 Dysphonia: Secondary | ICD-10-CM

## 2013-11-08 DIAGNOSIS — J439 Emphysema, unspecified: Secondary | ICD-10-CM

## 2013-11-08 DIAGNOSIS — M069 Rheumatoid arthritis, unspecified: Secondary | ICD-10-CM

## 2013-11-08 DIAGNOSIS — M810 Age-related osteoporosis without current pathological fracture: Secondary | ICD-10-CM

## 2013-11-08 DIAGNOSIS — E119 Type 2 diabetes mellitus without complications: Secondary | ICD-10-CM

## 2013-11-08 LAB — BASIC METABOLIC PANEL
BUN: 13 mg/dL (ref 6–23)
CO2: 25 mEq/L (ref 19–32)
Calcium: 9.2 mg/dL (ref 8.4–10.5)
Chloride: 106 mEq/L (ref 96–112)
Creatinine, Ser: 0.9 mg/dL (ref 0.4–1.5)
GFR: 90.94 mL/min (ref >60.00)
Glucose, Bld: 199 mg/dL — ABNORMAL HIGH (ref 70–99)
Potassium: 3.3 mEq/L — ABNORMAL LOW (ref 3.5–5.1)
Sodium: 139 mEq/L (ref 135–145)

## 2013-11-08 LAB — HEMOGLOBIN A1C: Hgb A1c MFr Bld: 6.5 % (ref 4.6–6.5)

## 2013-11-08 LAB — TSH: TSH: 1.11 u[IU]/mL (ref 0.35–5.50)

## 2013-11-08 NOTE — Assessment & Plan Note (Signed)
Labs

## 2013-11-08 NOTE — Assessment & Plan Note (Addendum)
Getting hoarse according to the patient, refer to ENT. He is a former smoker

## 2013-11-08 NOTE — Progress Notes (Signed)
Subjective:    Patient ID: Joseph [name], male    DOB: October 30, 1950, 63 y.o.   MRN: [id number]  DOS:  11/08/2013 Type of  visit: ROV, Here with his wife. In general feels well. His voice is hoarse more consistently, patient is concerned. Denies any GERD symptoms. Pt  was trying to get a dental implant, his surgeon discussed case with rheumatology and they decided not to pursue that route. Pain control on OxyContin, 3 times a day, that works for him. Hypertension, good medication compliance, ambulatory BPs within normal when he goes to the doctor's. COPD, home oxygen 24 7, slightly more dyspnea on exertion lately?.   ROS Denies abdominal pain, blood in the stools, nausea or vomiting. Continue with chronic cough, denies hemoptysis. Respiratory symptoms are not worse except for mild increasing dyspnea on exertion  Past Medical History  Diagnosis Date  . Rheumatoid arthritis(714.0)   . Psoriasis   . Hypertension   . Gastroesophageal reflux disease   . COPD (chronic obstructive pulmonary disease)   . CAD (coronary artery disease)     MI 2007, CABG  . Osteoporosis     per DEXA 05-2008  . Peripheral polyneuropathy     Severe, causing problems with his feet, see surgeries  . Diabetes mellitus     Dx 07-2008 A1C 6.2  . CHF (congestive heart failure)   . Onychomycosis     toenails  . PONV (postoperative nausea and vomiting)   . Hiatal hernia   . Esophageal dysmotility   . Gastroparesis   . Fatty liver 10/11/09  . Pulmonary fibrosis   . Neuropathy     (related to RA per neuro note 05-2011)    Past Surgical History  Procedure Laterality Date  . Foot surgery      to tendon release and repair of osteomyelitis   . Cholecystectomy    . Coronary artery bypass graft  2007    (LIMA to the LAD, left radial to obtuse marginal, SVG to first diagonal, SVG to PDA. His ejection fraction to 50-55%  . Achiles tendon surgery  8/09  . Toe amputation  4-12/ 3 14    d/t a deformity, found to  have osteomyelitis, complicated by post-op foot strtess FX  . Esophagogastroduodenoscopy N/A 10/19/2012    Procedure: ESOPHAGOGASTRODUODENOSCOPY (EGD);  Surgeon: Hart Carwin, MD;  Location: [facility] ENDOSCOPY;  Service: Endoscopy;  Laterality: N/A;  the dilatation is possible.     History   Social History  . Marital Status: Married    Spouse Name: N/A    Number of Children: 0  . Years of Education: N/A   Occupational History  . disabled-retired    Social History Main Topics  . Smoking status: Former Smoker -- 1.00 packs/day for 40 years    Quit date: 08/26/2005  . Smokeless tobacco: Former Neurosurgeon    Quit date: 04/02/2006     Comment: smoked x 40 years, 1 ppd  . Alcohol Use: No  . Drug Use: Not on file  . Sexual Activity: Not on file   Other Topics Concern  . Not on file   Social History Narrative  . No narrative on file        Medication List       This list is accurate as of: 11/08/13  5:42 PM.  Always use your most recent med list.               albuterol 90 MCG/ACT inhaler  Commonly known as:  PROVENTIL,VENTOLIN  Inhale 2 puffs into the lungs every 4 (four) hours as needed for wheezing or shortness of breath.     amitriptyline 75 MG tablet  Commonly known as:  ELAVIL  Take 75 mg by mouth 3 (three) times daily.     aspirin 81 MG tablet  Take 81 mg by mouth daily.     atorvastatin 80 MG tablet  Commonly known as:  LIPITOR  Take 1 tablet (80 mg total) by mouth daily.     fluticasone 0.05 % cream  Commonly known as:  CUTIVATE     Fluticasone-Salmeterol 100-50 MCG/DOSE Aepb  Commonly known as:  ADVAIR DISKUS  Inhale 1 puff into the lungs 2 (two) times daily.     FLUTTER Devi  Blow through several times, 2 or 3 sets per day as needed     FLUTTER Devi  Use as directed     FOLIC ACID PO  Take 100 mcg by mouth daily.     furosemide 20 MG tablet  Commonly known as:  LASIX  2 in the morning and 1 in the afternoon     methotrexate 2.5 MG tablet  Commonly  known as:  RHEUMATREX  8 tablets every Thursday     metoprolol tartrate 25 MG tablet  Commonly known as:  LOPRESSOR  Take 1 tablet (25 mg total) by mouth 2 (two) times daily.     nitroGLYCERIN 0.4 MG SL tablet  Commonly known as:  NITROSTAT  Place 1 tablet (0.4 mg total) under the tongue every 5 (five) minutes as needed for chest pain.     NON FORMULARY  - EVERGO PORTABLE PULSE DOSE OXYGEN  - oxygen 3L per nasal cannula 24 hours a day  - Dx: 496     ONE TOUCH ULTRA TEST test strip  Generic drug:  glucose blood  USE AS DIRECTED ONCE DAILY     onetouch ultrasoft lancets  USE AS DIRECTED     oxyCODONE-acetaminophen 7.5-325 MG per tablet  Commonly known as:  PERCOCET  Take 1 tablet by mouth. 3 times a day     pantoprazole 40 MG tablet  Commonly known as:  PROTONIX  TAKE ONE(1) TABLET 2 TIMES DAILY.     PERFECT IRON PO  Take by mouth. 1 tab daily     potassium chloride SA 20 MEQ tablet  Commonly known as:  K-DUR,KLOR-CON  Take 2 tablets (40 mEq total) by mouth 2 (two) times daily. 2 by mouth in the morning and 2 by mouth at 4 pm     predniSONE 5 MG tablet  Commonly known as:  DELTASONE  Take 5 mg by mouth daily.     pregabalin 150 MG capsule  Commonly known as:  LYRICA  Take 150 mg by mouth 2 (two) times daily.     tiotropium 18 MCG inhalation capsule  Commonly known as:  SPIRIVA HANDIHALER  Place 1 capsule (18 mcg total) into inhaler and inhale daily. 1 puff in AM     UNABLE TO FIND  - Med Name: Incentive Spirometer  - Use as directed     vitamin B-12 250 MCG tablet  Commonly known as:  CYANOCOBALAMIN  Take 250 mcg by mouth daily.     vitamin C 500 MG tablet  Commonly known as:  ASCORBIC ACID  Take 500 mg by mouth daily.           Objective:   Physical Exam BP 106/71  Pulse 72  Temp(Src) 98.3 F (36.8  C)  Wt 248 lb (112.492 kg)  SpO2 94%  General -- alert, well-developed, NAD.   HEENT-- Not pale. Neck without mass or lymphadenopathy. Voice  hoarse. Lungs -- normal respiratory effort, no intercostal retractions, no accessory muscle use, and normal breath sounds.  Heart-- normal rate, regular rhythm, no murmur.   Extremities-- no pretibial edema bilaterally , + pedal pulses B; ++ deformities and surgical scars bilaterally, no redness, warmness or discharge. No wounds. Psych-- Cognition and judgment appear intact. Cooperative with normal attention span and concentration. No anxious or depressed appearing.         Assessment & Plan:

## 2013-11-08 NOTE — Assessment & Plan Note (Signed)
Had severe feet deformity, likely multifactorial, f/u by ortho Dr Samuel Bouche

## 2013-11-08 NOTE — Patient Instructions (Signed)
Get your blood work before you leave   Next visit is for a physical exam in 4-6 months  fasting Please make an appointment    Prevnar 13 , a pneumonia shot is indicated.

## 2013-11-08 NOTE — Progress Notes (Signed)
Pre visit review using our clinic review tool, if applicable. No additional management support is needed unless otherwise documented below in the visit note. 

## 2013-11-08 NOTE — Assessment & Plan Note (Signed)
In need of a dental implant, I try to contact Dr Sharren Bridge few days ago but I was unable to. Eventually his surgeon did and they decided against the surgery

## 2013-11-08 NOTE — Assessment & Plan Note (Signed)
Well controlled, check labs 

## 2013-11-08 NOTE — Assessment & Plan Note (Signed)
Symptoms well-controlled although the patient is noticing slight increased dyspnea on exertion. Recommend a Prevnar 13,

## 2013-11-09 ENCOUNTER — Telehealth: Payer: Self-pay | Admitting: Internal Medicine

## 2013-11-09 NOTE — Telephone Encounter (Signed)
Relevant patient education assigned to patient using Emmi. ° °

## 2013-11-15 ENCOUNTER — Other Ambulatory Visit: Payer: Self-pay | Admitting: *Deleted

## 2013-11-15 MED ORDER — NITROGLYCERIN 0.4 MG SL SUBL: 0.4000 mg | SUBLINGUAL_TABLET | SUBLINGUAL | Status: DC | PRN

## 2013-11-16 ENCOUNTER — Telehealth: Payer: Self-pay | Admitting: Internal Medicine

## 2013-11-16 NOTE — Telephone Encounter (Signed)
Patient is calling because he is hoarse. He has had this problem for awhile now. His PCP scheduled him to see Dr. Jenne Pane, ENT. He wants to know if Dr. Juanda Chance thinks this is a good idea. Please, advise.

## 2013-11-16 NOTE — Telephone Encounter (Signed)
OK to see Dr Jenne Pane, but  We know that he has had a severe reflux with LPR and we are treating it. Dr Jenne Pane may check to make sure no cancer.

## 2013-11-17 ENCOUNTER — Telehealth: Payer: Self-pay

## 2013-11-17 NOTE — Telephone Encounter (Signed)
Unable to reach patient due to mailbox being full. Will try again later.

## 2013-11-17 NOTE — Telephone Encounter (Signed)
Relevant patient education assigned to patient using Emmi. ° °

## 2013-11-17 NOTE — Telephone Encounter (Signed)
Patient given Dr. Brodie's recommendation. 

## 2013-11-28 ENCOUNTER — Other Ambulatory Visit: Payer: Self-pay | Admitting: Internal Medicine

## 2013-12-03 ENCOUNTER — Telehealth: Payer: Self-pay | Admitting: Pulmonary Disease

## 2013-12-03 ENCOUNTER — Telehealth: Payer: Self-pay

## 2013-12-03 NOTE — Telephone Encounter (Signed)
Spoke with Tambra-Dr. Jenne Pane nurse. She reports pt needs surgical clearance. He will be having vocal fold injection. 30 min process under general anesthesia. Does pt need OV Dr. Craige Cotta? Thermon Leyland

## 2013-12-03 NOTE — Telephone Encounter (Signed)
Is that needs only local anesthesia and is an office procedure he is clear.

## 2013-12-03 NOTE — Telephone Encounter (Signed)
Joseph [name] from Dr Jenne Pane office called to get verbal surgical clearance for a vocal fold injection to be performed under local anesthesia. Patient was last seen in our office 11/08/2013 dysphonia. Please advise.

## 2013-12-06 NOTE — Telephone Encounter (Signed)
LM on voice mail for Elizebeth Brooking to call back for instructions.

## 2013-12-06 NOTE — Telephone Encounter (Signed)
Pre Dr Jenne Pane office, the procedure will be general anesthesia. Please advise

## 2013-12-06 NOTE — Telephone Encounter (Signed)
He is scheduled for ROV on 12/20/13 >> will assess then unless ENT thinks he needs to be scheduled for procedure sooner.

## 2013-12-06 NOTE — Telephone Encounter (Signed)
Called and made tambra aware. Nothing further needed

## 2013-12-07 NOTE — Telephone Encounter (Signed)
Patient has a history of CAD,last stress test was in 2012, it was abnormal. Please arrange a visit to see cardiology ref. surgical clearance

## 2013-12-08 NOTE — Telephone Encounter (Signed)
Spoke with patient and advised of recommendations. States that he will schedule an appointment with his cardiologist.

## 2013-12-20 ENCOUNTER — Ambulatory Visit (INDEPENDENT_AMBULATORY_CARE_PROVIDER_SITE_OTHER): Payer: BC Managed Care – PPO | Admitting: Pulmonary Disease

## 2013-12-20 ENCOUNTER — Encounter: Payer: Self-pay | Admitting: Pulmonary Disease

## 2013-12-20 VITALS — BP 100/68 | HR 88 | Ht 73.0 in | Wt 250.0 lb

## 2013-12-20 DIAGNOSIS — R918 Other nonspecific abnormal finding of lung field: Secondary | ICD-10-CM

## 2013-12-20 DIAGNOSIS — M069 Rheumatoid arthritis, unspecified: Secondary | ICD-10-CM

## 2013-12-20 DIAGNOSIS — J438 Other emphysema: Secondary | ICD-10-CM

## 2013-12-20 DIAGNOSIS — R490 Dysphonia: Secondary | ICD-10-CM

## 2013-12-20 DIAGNOSIS — J9610 Chronic respiratory failure, unspecified whether with hypoxia or hypercapnia: Secondary | ICD-10-CM

## 2013-12-20 DIAGNOSIS — J479 Bronchiectasis, uncomplicated: Secondary | ICD-10-CM

## 2013-12-20 DIAGNOSIS — J439 Emphysema, unspecified: Secondary | ICD-10-CM

## 2013-12-20 DIAGNOSIS — R911 Solitary pulmonary nodule: Secondary | ICD-10-CM

## 2013-12-20 MED ORDER — ACAPELLA MISC: Status: DC

## 2013-12-20 NOTE — Patient Instructions (Signed)
Will send letter to Dr. Jenne Pane Will arrange for f/u CT chest in August 2015 Follow up in August 2015

## 2013-12-20 NOTE — Progress Notes (Signed)
Chief Complaint  Patient presents with  . COPD    Breathing is unchanged. Still reports SOB. Cough is present from pollen. Denies chest tightness or wheezing.    CC: Joseph [name], Joseph [name]  History of Present Illness: Joseph [name] is a 63 y.o. male former smoker with COPD/emphysema, rheumatoid arthritis, recurrent pneumonia 2nd to chronic aspiration/reflux, bronchiectasis, chronic respiratory failure on 3 liters oxygen 24/7, and pulmonary nodules.  He is scheduled to have vocal cord injection with Dr. Jenne Pane with ENT.  He will need general anesthesia for the procedure.  He gets occasional cough and clear sputum.  He does not have much wheeze.  He denies chest pain/tightness.  TESTS: PFT 01/12/09 >> FEV1 3.02 (85%), FEV1% 68, TLC 6.35 (88%), DLCO 45%, no BD CT chest 10/17/12 >> severe centrilobular emphysema, apical bullae, 1 cm nodule LUL new, 5 mm nodule RLL stable since 2009, BTX with GGO at bases PFT 12/02/12 >> FEV1 3.11 (78%), FEV1% 77, TLC 6.32 (83%), DLCO 37%, no BD CT chest 05/06/13 >> decreased size LUL nodule now 8 mm, 4 mm nodule RLL, 3 mm nodule RML, 7 mm nodule RML, 8 mm nodule lingula  Joseph [name]  has a past medical history of Rheumatoid arthritis(714.0); Psoriasis; Hypertension; Gastroesophageal reflux disease; COPD (chronic obstructive pulmonary disease); CAD (coronary artery disease); Osteoporosis; Peripheral polyneuropathy; Diabetes mellitus; CHF (congestive heart failure); Onychomycosis; PONV (postoperative nausea and vomiting); Hiatal hernia; Esophageal dysmotility; Gastroparesis; Fatty liver (10/11/09); Pulmonary fibrosis; and Neuropathy.  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  Prior to Admission medications   Medication Sig Start Date End Date Taking? Authorizing  Provider  albuterol (PROVENTIL,VENTOLIN) 90 MCG/ACT inhaler Inhale 2 puffs into the lungs every 4 (four) hours as needed for wheezing or shortness of breath. 04/09/11  Yes Waymon Budge, MD  amitriptyline (ELAVIL) 75 MG tablet Take 75 mg by mouth 3 (three) times daily.    Yes Historical Provider, MD  Ascorbic Acid (VITAMIN C) 500 MG tablet Take 500 mg by mouth daily.     Yes Historical Provider, MD  aspirin 81 MG tablet Take 81 mg by mouth daily.     Yes Historical Provider, MD  atorvastatin (LIPITOR) 80 MG tablet Take 1 tablet (80 mg total) by mouth daily. 02/18/12  Yes Rollene Rotunda, MD  calamine ([facility] CALAMINE) lotion     Yes Historical Provider, MD  Carbonyl Iron (PERFECT IRON PO) Take by mouth. 1 tab daily    Yes Historical Provider, MD  Fluticasone-Salmeterol (ADVAIR DISKUS) 100-50 MCG/DOSE AEPB 1 puff then rinse mouth, twice daily 03/16/12 03/16/13 Yes Clinton D Young, MD  FOLIC ACID PO Take 100 mcg by mouth daily.   Yes Historical Provider, MD  furosemide (LASIX) 20 MG tablet Take 60 mg by mouth daily. 2 in the morning and 1 in the afternoon 02/18/12  Yes Rollene Rotunda, MD  glucose blood (ONE TOUCH ULTRA TEST) test strip USE AS DIRECTED 11/04/12  Yes Wanda Plump, MD  Lancets [facility] ULTRASOFT) lancets Use as instructed 12/23/11 12/22/12 Yes Wanda Plump, MD  methotrexate (RHEUMATREX) 2.5 MG tablet Take 8 tablets every week 10/29/12  Yes Jeanella Craze, NP  metoprolol tartrate (LOPRESSOR) 25 MG tablet Take 1 tablet (25 mg total) by mouth 2 (two) times daily. 02/18/12  Yes Rollene Rotunda, MD  morphine (MS CONTIN) 15 MG 12 hr  tablet Take 15 mg by mouth 2 (two) times daily as needed for pain.   Yes Historical Provider, MD  nitroGLYCERIN (NITROSTAT) 0.4 MG SL tablet Place 1 tablet (0.4 mg total) under the tongue every 5 (five) minutes as needed for chest pain. 02/18/12  Yes Rollene Rotunda, MD  NON FORMULARY EVERGO PORTABLE PULSE DOSE OXYGEN oxygen 3L per nasal cannula 24 hours a day Dx: 496   Yes  Historical Provider, MD  nystatin (MYCOSTATIN) 100000 UNIT/ML suspension Take 5 mLs (500,000 Units total) by mouth 4 (four) times daily. 10/27/12  Yes Waymon Budge, MD  oxyCODONE-acetaminophen (PERCOCET) 7.5-325 MG per tablet Take 1 tablet by mouth. 3 times a day   Yes Historical Provider, MD  pantoprazole (PROTONIX) 40 MG tablet Take 1 tablet (40 mg total) by mouth 2 (two) times daily. 10/21/12  Yes Jeanella Craze, NP  potassium chloride SA (K-DUR,KLOR-CON) 20 MEQ tablet 2 by mouth in the morning and 1 by mouth at 4 pm 02/18/12  Yes Rollene Rotunda, MD  predniSONE (STERAPRED UNI-PAK) 10 MG tablet Take by mouth daily. TAKES 6 MG AM . 10/21/12  Yes Jeanella Craze, NP  pregabalin (LYRICA) 150 MG capsule Take 150 mg by mouth 3 (three) times daily.     Yes Historical Provider, MD  Respiratory Therapy Supplies (FLUTTER) DEVI Blow through several times, 2 or 3 sets per day as needed 09/16/11  Yes Waymon Budge, MD  tiotropium (SPIRIVA HANDIHALER) 18 MCG inhalation capsule Place 1 capsule (18 mcg total) into inhaler and inhale daily. 1 puff in AM 03/16/12 03/16/13 Yes Waymon Budge, MD  UNABLE TO FIND Med Name: Incentive Spirometer Use as directed 08/01/11  Yes Waymon Budge, MD  vitamin B-12 (CYANOCOBALAMIN) 250 MCG tablet Take 250 mcg by mouth daily.     Yes Historical Provider, MD    No Known Allergies   Physical Exam:  General - No distress, wearing oxygen ENT - No sinus tenderness, no oral exudate, no LAN Cardiac - s1s2 regular, no murmur Chest - decreased breath sounds, no wheeze/rales Back - No focal tenderness Abd - Soft, non-tender Ext - deformities of rheumatoid arthritis Neuro - Normal strength Skin - No rashes Psych - normal mood, and behavior   Assessment/Plan:  Joseph Helling, MD [facility] Pager:  [phone]

## 2013-12-26 NOTE — Assessment & Plan Note (Signed)
Continue 3 liters oxygen 24/7. 

## 2013-12-26 NOTE — Assessment & Plan Note (Signed)
He is to continue spiriva, advair, and prn albuterol.

## 2013-12-26 NOTE — Assessment & Plan Note (Signed)
He is scheduled for vocal cord injection with ENT.  He can proceed with procedure.  Explained that he is at risk for respiratory failure needing respiratory assistance.  Explained that procedure should be done in setting that can closely monitor his oxygenation and support his ventilation.

## 2013-12-26 NOTE — Assessment & Plan Note (Addendum)
Stable on CT chest from September 2014.  He will order f/u CT chest w/o contrast.

## 2013-12-26 NOTE — Assessment & Plan Note (Signed)
He is on chronic prednisone and MTX per rheumatology at [facility].

## 2013-12-26 NOTE — Assessment & Plan Note (Signed)
Related to recurrent respiratory infections in setting of chronic aspiration and rheumatoid arthritis.  Continue current pulmonary hygiene regimen. 

## 2013-12-29 ENCOUNTER — Ambulatory Visit (INDEPENDENT_AMBULATORY_CARE_PROVIDER_SITE_OTHER): Payer: BC Managed Care – PPO | Admitting: Nurse Practitioner

## 2013-12-29 ENCOUNTER — Other Ambulatory Visit: Payer: Self-pay | Admitting: *Deleted

## 2013-12-29 ENCOUNTER — Encounter: Payer: Self-pay | Admitting: Nurse Practitioner

## 2013-12-29 VITALS — BP 110/70 | HR 77 | Ht 72.0 in | Wt 247.6 lb

## 2013-12-29 DIAGNOSIS — E876 Hypokalemia: Secondary | ICD-10-CM

## 2013-12-29 DIAGNOSIS — Z01818 Encounter for other preprocedural examination: Secondary | ICD-10-CM

## 2013-12-29 LAB — BASIC METABOLIC PANEL
BUN: 15 mg/dL (ref 6–23)
CO2: 26 mEq/L (ref 19–32)
Calcium: 9.3 mg/dL (ref 8.4–10.5)
Chloride: 105 mEq/L (ref 96–112)
Creatinine, Ser: 0.9 mg/dL (ref 0.4–1.5)
GFR: 89.75 mL/min (ref >60.00)
Glucose, Bld: 158 mg/dL — ABNORMAL HIGH (ref 70–99)
Potassium: 3.2 mEq/L — ABNORMAL LOW (ref 3.5–5.1)
Sodium: 140 mEq/L (ref 135–145)

## 2013-12-29 MED ORDER — ATORVASTATIN CALCIUM 80 MG PO TABS: 80.0000 mg | ORAL_TABLET | Freq: Every day | ORAL | Status: DC

## 2013-12-29 NOTE — Progress Notes (Signed)
Judie Bonus Date of Birth: 09-08-1950 Medical Record #[id number]  History of Present Illness: [name] is seen back today for a surgical clearance visit. Seen for Dr. Antoine Poche. He has known CAD with remote MI & CABG in 2007. His grafts in 2007 included LIMA to LAD, left radial artery to obtuse marginal-1, saphenous vein graft to first diagonal, saphenous vein graft to left posterior descending. Echo in 2009 demonstrated normal LVF. Prior nuclear images from 2012 demonstrated partially reversible anteroapical ischemia - he was not having any symptoms, probably correlated with his known coronary anatomy (His anatomy prior to his CABG included an occluded D1 and D2. His D1 was grafted and the D2 was not) and was continued on medical management.  Other issues include carotid disease (0 to 39% in 2012 study), RA, psoriasis, HTN, GERD, COPD, neuropathy, DM, pulmonary fibrosis - on oxygen therapy - and HLD.   Last seen here in September of 2014. Seemed to be stable from a cardiac standpoint.  Comes back today. Here with his wife. Planning on having a vocal cord injection by Dr. Jenne Pane to improve his speech. Has already seen Dr. Craige Cotta with pulmonary for his clearance and it was advised that "Explained that he is at risk for respiratory failure needing respiratory assistance. Explained that procedure should be done in setting that can closely monitor his oxygenation and support his ventilation."  He is doing ok from a cardiac standpoint. No chest pain. Breathing most limiting factor. Pretty sedentary. He is hardly able to talk due to this vocal cord issue. Not dizzy or lightheaded. No passing out spells. Notes that he has gotten a new pair of shoes which has helped his feet - he has had lots of ulcers apparently.    Current Outpatient Prescriptions  Medication Sig Dispense Refill  . albuterol (PROVENTIL,VENTOLIN) 90 MCG/ACT inhaler Inhale 2 puffs into the lungs every 4 (four) hours as needed for wheezing  or shortness of breath.  17 g  prn  . amitriptyline (ELAVIL) 75 MG tablet Take 75 mg by mouth 2 (two) times daily.       . Ascorbic Acid (VITAMIN C) 500 MG tablet Take 500 mg by mouth daily.        Marland Kitchen aspirin 81 MG tablet Take 81 mg by mouth daily.        Marland Kitchen atorvastatin (LIPITOR) 80 MG tablet Take 80 mg by mouth 2 (two) times daily.      Marland Kitchen Carbonyl Iron (PERFECT IRON PO) Take by mouth. 1 tab daily       . Fluticasone-Salmeterol (ADVAIR DISKUS) 100-50 MCG/DOSE AEPB Inhale 1 puff into the lungs 2 (two) times daily.  60 each  6  . FOLIC ACID PO Take 100 mcg by mouth daily.      . furosemide (LASIX) 20 MG tablet 2 in the morning and 1 in the afternoon  90 tablet  6  . Lancets (ONETOUCH ULTRASOFT) lancets USE AS DIRECTED  100 each  3  . methotrexate (RHEUMATREX) 2.5 MG tablet 8 tablets every Thursday      . metoprolol tartrate (LOPRESSOR) 25 MG tablet Take 1 tablet (25 mg total) by mouth 2 (two) times daily.  180 tablet  3  . Misc. Devices (ACAPELLA) MISC Use as directed  1 each  0  . nitroGLYCERIN (NITROSTAT) 0.4 MG SL tablet Place 1 tablet (0.4 mg total) under the tongue every 5 (five) minutes as needed for chest pain.  25 tablet  1  . NON  FORMULARY [place] PORTABLE PULSE DOSE OXYGEN oxygen 3L per nasal cannula 24 hours a day Dx: 496      . ONE TOUCH ULTRA TEST test strip USE AS DIRECTED ONCE DAILY  50 each  12  . oxyCODONE-acetaminophen (PERCOCET) 7.5-325 MG per tablet Take 1 tablet by mouth. 3 times a day      . pantoprazole (PROTONIX) 40 MG tablet TAKE ONE(1) TABLET 2 TIMES DAILY.  180 tablet  1  . potassium chloride SA (K-DUR,KLOR-CON) 20 MEQ tablet Take 40 mEq by mouth 2 (two) times daily. 2 by mouth in the morning and 1 by mouth at 4 pm      . predniSONE (DELTASONE) 5 MG tablet Take 5 mg by mouth daily.      . pregabalin (LYRICA) 150 MG capsule Take 150 mg by mouth 2 (two) times daily.       Marland Kitchen Respiratory Therapy Supplies (FLUTTER) DEVI Blow through several times, 2 or 3 sets per day as  needed  1 each  0  . Respiratory Therapy Supplies (FLUTTER) DEVI Use as directed  1 each  0  . tiotropium (SPIRIVA HANDIHALER) 18 MCG inhalation capsule Place 1 capsule (18 mcg total) into inhaler and inhale daily. 1 puff in AM  30 capsule  6  . UNABLE TO FIND Med Name: Incentive Spirometer Use as directed  1 Device  0  . vitamin B-12 (CYANOCOBALAMIN) 250 MCG tablet Take 250 mcg by mouth daily.        . [DISCONTINUED] omeprazole (PRILOSEC OTC) 20 MG tablet Take 1 tablet (20 mg total) by mouth daily.  60 tablet  0   No current facility-administered medications for this visit.    No Known Allergies  Past Medical History  Diagnosis Date  . Rheumatoid arthritis(714.0)   . Psoriasis   . Hypertension   . Gastroesophageal reflux disease   . COPD (chronic obstructive pulmonary disease)   . CAD (coronary artery disease)     MI 2007, CABG  . Osteoporosis     per DEXA 05-2008  . Peripheral polyneuropathy     Severe, causing problems with his feet, see surgeries  . Diabetes mellitus     Dx 07-2008 A1C 6.2  . CHF (congestive heart failure)   . Onychomycosis     toenails  . PONV (postoperative nausea and vomiting)   . Hiatal hernia   . Esophageal dysmotility   . Gastroparesis   . Fatty liver 10/11/09  . Pulmonary fibrosis   . Neuropathy     (related to RA per neuro note 05-2011)    Past Surgical History  Procedure Laterality Date  . Foot surgery      to tendon release and repair of osteomyelitis   . Cholecystectomy    . Coronary artery bypass graft  2007    (LIMA to the LAD, left radial to obtuse marginal, SVG to first diagonal, SVG to PDA. His ejection fraction to 50-55%  . Achiles tendon surgery  8/09  . Toe amputation  4-12/ 3 14    d/t a deformity, found to have osteomyelitis, complicated by post-op foot strtess FX  . Esophagogastroduodenoscopy N/A 10/19/2012    Procedure: ESOPHAGOGASTRODUODENOSCOPY (EGD);  Surgeon: Hart Carwin, MD;  Location: [facility] ENDOSCOPY;  Service:  Endoscopy;  Laterality: N/A;  the dilatation is possible.     History  Smoking status  . Former Smoker -- 1.00 packs/day for 40 years  . Quit date: 08/26/2005  Smokeless tobacco  . Former Neurosurgeon  .  Quit date: 04/02/2006    Comment: smoked x 40 years, 1 ppd    History  Alcohol Use No    Family History  Problem Relation Age of Onset  . Heart disease Mother   . Diverticulitis Mother   . Heart disease Father   . Heart attack Father     x2  . Colon cancer Neg Hx   . Prostate cancer Neg Hx     Review of Systems: The review of systems is per the HPI.  All other systems were reviewed and are negative.  Physical Exam: Ht 6' (1.829 m)  Wt 247 lb 9.6 oz (112.311 kg)  BMI 33.57 kg/m2 Patient is very pleasant and in no acute distress. He looks chronically ill. Short of breath with exertion. Has oxygen has 3 liters in place. Skin is warm and dry. Color is normal.  HEENT is unremarkable. Normocephalic/atraumatic. PERRL. Sclera are nonicteric. Neck is supple. No masses. No JVD. Lungs are fairly clear. Cardiac exam shows a regular rate and rhythm. Abdomen is soft. Extremities are without edema. Gait and ROM are intact. No gross neurologic deficits noted.  LABORATORY DATA: EKG today shows sinus bradycardia.   Lab Results  Component Value Date   WBC 9.6 06/22/2013   HGB 14.6 06/22/2013   HCT 43.4 06/22/2013   PLT 227.0 06/22/2013   GLUCOSE 199* 11/08/2013   CHOL 130 03/30/2013   TRIG 102.0 03/30/2013   HDL 34.90* 03/30/2013   LDLCALC 75 03/30/2013   ALT 39 06/22/2013   AST 29 06/22/2013   NA 139 11/08/2013   K 3.3* 11/08/2013   CL 106 11/08/2013   CREATININE 0.9 11/08/2013   BUN 13 11/08/2013   CO2 25 11/08/2013   TSH 1.11 11/08/2013   PSA 0.34 05/08/2012   INR 1.00 10/11/2009   HGBA1C 6.5 11/08/2013   MICROALBUR 1.8 10/09/2010    Assessment / Plan: 1. CAD - prior MI & CABG - no active symptoms.   2. Pulmonary fibrosis/COPD  3. Pre op clearance for vocal cord injection - do not feel that  further cardiac testing is warranted - his last stress test was abnormal - possibly consistent with his anatomy - he has been managed medically since that time - I would agree with Dr. Evlyn Courier recommendation for close monitoring and observation. Would favor this in the hospital setting. Cardiology will be available as needed. I have talked with Dr. Johney Frame here today (DOD) and he is in agreement with this plan of care.   4. HLD - has Lipitor 80 BID on his record - will need to clarify this dose.   5. Hypokalemia - recheck his labs today. Wife notes that he has had trouble with this pill due to the size and sometimes does not take.  Patient is agreeable to this plan and will call if any problems develop in the interim.   Rosalio Macadamia, RN, ANP-C [facility] [address] [place], [place]  [address] [phone]

## 2013-12-29 NOTE — Patient Instructions (Signed)
Call us about your exact dose of Lipitor  We need to recheck your potassium level today  You may break the potassium pills in half and dissolve in water - do not crush  I will send a note to Dr. Jenne Pane to proceed with your surgery  See Dr. Antoine Poche back as planned  Call the [facility] office at [phone] if you have any questions, problems or concerns.

## 2014-01-05 ENCOUNTER — Telehealth: Payer: Self-pay | Admitting: Internal Medicine

## 2014-01-05 ENCOUNTER — Other Ambulatory Visit (INDEPENDENT_AMBULATORY_CARE_PROVIDER_SITE_OTHER): Payer: BC Managed Care – PPO

## 2014-01-05 DIAGNOSIS — E876 Hypokalemia: Secondary | ICD-10-CM

## 2014-01-05 LAB — BASIC METABOLIC PANEL
BUN: 17 mg/dL (ref 6–23)
CO2: 25 mEq/L (ref 19–32)
Calcium: 9.3 mg/dL (ref 8.4–10.5)
Chloride: 105 mEq/L (ref 96–112)
Creatinine, Ser: 0.9 mg/dL (ref 0.4–1.5)
GFR: 92.08 mL/min (ref >60.00)
Glucose, Bld: 184 mg/dL — ABNORMAL HIGH (ref 70–99)
Potassium: 3.9 mEq/L (ref 3.5–5.1)
Sodium: 140 mEq/L (ref 135–145)

## 2014-01-05 NOTE — Telephone Encounter (Signed)
Caller name:Denali Craton Relation to VC:[id number] Call back number: [phone] Pharmacy:  Reason for call: to request that his recent lab work be sent to Dr Jenne Pane at [facility].

## 2014-01-06 ENCOUNTER — Other Ambulatory Visit (HOSPITAL_COMMUNITY): Payer: Self-pay | Admitting: Otolaryngology

## 2014-01-06 NOTE — Telephone Encounter (Signed)
Done by Debbe Bales according to lab results 01/05/14 .

## 2014-01-07 ENCOUNTER — Other Ambulatory Visit: Payer: Self-pay

## 2014-01-07 MED ORDER — FUROSEMIDE 20 MG PO TABS: ORAL_TABLET | ORAL | Status: DC

## 2014-01-14 ENCOUNTER — Encounter (HOSPITAL_COMMUNITY): Payer: Self-pay

## 2014-01-15 NOTE — Pre-Procedure Instructions (Signed)
Joseph [name]  01/15/2014   Your procedure is scheduled on:  May 29  [facility] 09:15 AM.  Call this number if you have problems the morning of surgery: [phone]   Remember:   Do not eat food or drink liquids after midnight.   Take these medicines the morning of surgery with A SIP OF WATER: Albuterol, Advair, Metoprolol, Percocet, Protonix, Prednisone, Spiriva    STOP Vitamin B12, Folic Acid, Aspirin, Vitamin C, today   STOP/ Do not take Aspirin, Aleve, Naproxen, Advil, Ibuprofen, Vitamin, Herbs, or Supplements starting today   Do not wear jewelry, make-up or nail polish.  Do not wear lotions, powders, or perfumes. You may wear deodorant.  Do not shave 48 hours prior to surgery. Men may shave face and neck.  Do not bring valuables to the hospital.  [facility] is not responsible for any belongings or valuables.               Contacts, dentures or bridgework may not be worn into surgery.  Leave suitcase in the car. After surgery it may be brought to your room.  For patients admitted to the hospital, discharge time is determined by your treatment team.               Patients discharged the day of surgery will not be allowed to drive home.  Name and phone number of your driver: Family/ Friend  Special Instructions: See [facility] Preparing For Surgery   Please read over the following fact sheets that you were given: Pain Booklet, Coughing and Deep Breathing and Surgical Site Infection Prevention

## 2014-01-15 NOTE — Pre-Procedure Instructions (Signed)
[facility] - Preparing for Surgery  Before surgery, you can play an important role.  Because skin is not sterile, your skin needs to be as free of germs as possible.  You can reduce the number of germs on you skin by washing with CHG (chlorahexidine gluconate) soap before surgery.  CHG is an antiseptic cleaner which kills germs and bonds with the skin to continue killing germs even after washing.  Please DO NOT use if you have an allergy to CHG or antibacterial soaps.  If your skin becomes reddened/irritated stop using the CHG and inform your nurse when you arrive at Short Stay.  Do not shave (including legs and underarms) for at least 48 hours prior to the first CHG shower.  You may shave your face.  Please follow these instructions carefully:   1.  Shower with CHG Soap the night before surgery and the morning of Surgery.  2.  If you choose to wash your hair, wash your hair first as usual with your normal shampoo.  3.  After you shampoo, rinse your hair and body thoroughly to remove the shampoo.  4.  Use CHG as you would any other liquid soap.  You can apply CHG directly to the skin and wash gently with scrungie or a clean washcloth.  5.  Apply the CHG Soap to your body ONLY FROM THE NECK DOWN.  Do not use on open wounds or open sores.  Avoid contact with your eyes, ears, mouth and genitals (private parts).  Wash genitals (private parts) with your normal soap.  6.  Wash thoroughly, paying special attention to the area where your surgery will be performed.  7.  Thoroughly rinse your body with warm water from the neck down.  8.  DO NOT shower/wash with your normal soap after using and rinsing off the CHG Soap.  9.  Pat yourself dry with a clean towel.            10.  Wear clean pajamas.            11.  Place clean sheets on your bed the night of your first shower and do not sleep with pets.  Day of Surgery  Do not apply any lotions the morning of surgery.  Please wear clean clothes to the  hospital/surgery center.   

## 2014-01-18 ENCOUNTER — Encounter (HOSPITAL_COMMUNITY): Payer: Self-pay

## 2014-01-18 ENCOUNTER — Other Ambulatory Visit (HOSPITAL_COMMUNITY): Payer: BC Managed Care – PPO

## 2014-01-18 ENCOUNTER — Encounter (HOSPITAL_COMMUNITY)
Admission: RE | Admit: 2014-01-18 | Discharge: 2014-01-18 | Disposition: A | Discharge status: Home / Self Care | Payer: BC Managed Care – PPO | Source: Ambulatory Visit | Attending: Otolaryngology | Admitting: Otolaryngology

## 2014-01-18 HISTORY — DX: Dependence on supplemental oxygen: Z99.81

## 2014-01-18 LAB — BASIC METABOLIC PANEL
BUN: 14 mg/dL (ref 6–23)
CALCIUM: 9.8 mg/dL (ref 8.4–10.5)
CO2: 24 mEq/L (ref 19–32)
Chloride: 102 mEq/L (ref 96–112)
Creatinine, Ser: 0.84 mg/dL (ref 0.50–1.35)
GFR calc Af Amer: >90 mL/min (ref >90)
GFR calc non Af Amer: >90 mL/min (ref >90)
Glucose, Bld: 175 mg/dL — ABNORMAL HIGH (ref 70–99)
Potassium: 3.9 mEq/L (ref 3.7–5.3)
SODIUM: 140 meq/L (ref 137–147)

## 2014-01-18 LAB — CBC
HEMATOCRIT: 44.2 % (ref 39.0–52.0)
Hemoglobin: 14.8 g/dL (ref 13.0–17.0)
MCH: 30.8 pg (ref 26.0–34.0)
MCHC: 33.5 g/dL (ref 30.0–36.0)
MCV: 92.1 fL (ref 78.0–100.0)
PLATELETS: 214 10*3/uL (ref 150–400)
RBC: 4.8 MIL/uL (ref 4.22–5.81)
RDW: 14.2 % (ref 11.5–15.5)
WBC: 9.1 10*3/uL (ref 4.0–10.5)

## 2014-01-18 NOTE — Progress Notes (Signed)
Pt requests consult with Revonda Standard during PAT visit.Will F/u with Revonda Standard. He is on contin. o2 at 3L..for past 5 yrs

## 2014-01-19 NOTE — Progress Notes (Signed)
Anesthesia PAT Evaluation:  Patient is a 63 year old male scheduled for micro direct laryngoscopy with prolaryn injection/jet venturi ventilation on 01/21/14 by Dr. Jenne Pane. He has had hoarseness. I evaluated him at his PAT visit yesterday. Patient reported that Dr. Jenne Pane plans to admit him overnight.  History includes former smoker, post-operative N/V, obesity (current BMI 32), RA, psoriasis, HTN, GERD, hiatal hernia, COPD, bronchiectasis, pulmonary fibrosis, chronic O2 at 3L/[place], CAD/MI s/p CABG '07 (LIMA to LAD, left RA to OM1, SVG to D1, SVG left PDA) with known D2 occlusion (with faint collaterals) that was not grafted, osteoporosis, DM2 with peripheral neuropathy, fatty liver, gastroparesis, cholecystectomy. PCP is Dr. Willow Ora. Rheumatologist is Dr. Sheila Oats with [facility]. Cardiologist is Dr. Antoine Poche. Pulmonologist is Dr. Craige Cotta.  Both cardiology and pulmonology clearance notes are in Epic. His ASA is on hold for 5 days preoperatively.   For anesthesia history, he reported that following his CABG it took him a while to "wake up" and was combative post-operatively in the past.  He also gets post-operative N/V.  EKG on 12/29/13 showed SB at 57 bpm, low voltage QRS.   Nuclear stress test on 12/03/10 showed: Abnormal stress nuclear study. Partially reversible anteroapical ischemia. Normal LV systolic function. EF 60%. (Cardiology notes indicate that abnormality probably correlated with his coronary anatomy of known D2 occlusion, and since he was not have any significant ischemic symptoms medical therapy was recommended.)  Echo 03/30/08 showed:  - Overall left ventricular systolic function was normal. Left ventricular ejection fraction was estimated , range being 55 % to 60 %. There were no left ventricular regional wall motion abnormalities. There was mild focal basal septal hypertrophy. - Aortic valve thickness was mildly increased. - There was mild mitral annular calcification. - The estimated  peak pulmonary artery systolic pressure was mildly increased.  Carotid duplex on 12/14/10 showed: 0-39% bilateral ICA stenosis.  CXR on 04/09/13 showed: No acute cardiopulmonary abnormality.  Preoperative labs noted.  Of note, he reported that in the past his orthopedic foot surgeon Dr. Magdalene River had recommended pre and post operative antibiotic coverage to decrease risk of post-operative foot infection.  He has been on Keflex 500 MG 3-4 X/day for 2-5 days pre and 7-10 days post in the past.  He has already spoken with Dr. Jenne Pane office (I confirmed with his nurse Elizebeth Brooking), and Dr. Jenne Pane is in the process of deciding if he feels patient will need additional perioperative antibiotics.  Patient denied any active foot wounds. I also notified Elizebeth Brooking that patient was on methotrexate, but Dr. Jenne Pane had not advised that he needed to hold preoperatively.    Patient has been cleared and feels at his baseline from a cardiac and pulmonary standpoint. If no acute changes then I would anticipate that he could proceed as planned.  Velna Ochs [facility] Short Stay Center/Anesthesiology Phone [phone] 01/19/2014 6:09 PM

## 2014-01-21 ENCOUNTER — Encounter (HOSPITAL_COMMUNITY)
Admission: RE | Disposition: A | Discharge status: Home / Self Care | Payer: Self-pay | Source: Ambulatory Visit | Attending: Otolaryngology

## 2014-01-21 ENCOUNTER — Ambulatory Visit (HOSPITAL_COMMUNITY): Payer: BC Managed Care – PPO | Admitting: Certified Registered Nurse Anesthetist

## 2014-01-21 ENCOUNTER — Observation Stay (HOSPITAL_COMMUNITY)
Admission: RE | Admit: 2014-01-21 | Discharge: 2014-01-22 | Disposition: A | Discharge status: Home / Self Care | Payer: BC Managed Care – PPO | Source: Ambulatory Visit | Attending: Otolaryngology | Admitting: Otolaryngology

## 2014-01-21 ENCOUNTER — Encounter (HOSPITAL_COMMUNITY): Payer: BC Managed Care – PPO | Admitting: Vascular Surgery

## 2014-01-21 ENCOUNTER — Encounter (HOSPITAL_COMMUNITY): Payer: Self-pay | Admitting: *Deleted

## 2014-01-21 DIAGNOSIS — IMO0002 Reserved for concepts with insufficient information to code with codable children: Secondary | ICD-10-CM | POA: Insufficient documentation

## 2014-01-21 DIAGNOSIS — I10 Essential (primary) hypertension: Secondary | ICD-10-CM | POA: Insufficient documentation

## 2014-01-21 DIAGNOSIS — M069 Rheumatoid arthritis, unspecified: Secondary | ICD-10-CM | POA: Insufficient documentation

## 2014-01-21 DIAGNOSIS — M810 Age-related osteoporosis without current pathological fracture: Secondary | ICD-10-CM | POA: Insufficient documentation

## 2014-01-21 DIAGNOSIS — Z9981 Dependence on supplemental oxygen: Secondary | ICD-10-CM | POA: Insufficient documentation

## 2014-01-21 DIAGNOSIS — L408 Other psoriasis: Secondary | ICD-10-CM | POA: Insufficient documentation

## 2014-01-21 DIAGNOSIS — Z951 Presence of aortocoronary bypass graft: Secondary | ICD-10-CM | POA: Insufficient documentation

## 2014-01-21 DIAGNOSIS — K219 Gastro-esophageal reflux disease without esophagitis: Secondary | ICD-10-CM | POA: Insufficient documentation

## 2014-01-21 DIAGNOSIS — I252 Old myocardial infarction: Secondary | ICD-10-CM | POA: Insufficient documentation

## 2014-01-21 DIAGNOSIS — I2510 Atherosclerotic heart disease of native coronary artery without angina pectoris: Secondary | ICD-10-CM | POA: Insufficient documentation

## 2014-01-21 DIAGNOSIS — I509 Heart failure, unspecified: Secondary | ICD-10-CM | POA: Insufficient documentation

## 2014-01-21 DIAGNOSIS — Z87891 Personal history of nicotine dependence: Secondary | ICD-10-CM | POA: Insufficient documentation

## 2014-01-21 DIAGNOSIS — J8410 Pulmonary fibrosis, unspecified: Secondary | ICD-10-CM | POA: Insufficient documentation

## 2014-01-21 DIAGNOSIS — J4489 Other specified chronic obstructive pulmonary disease: Secondary | ICD-10-CM | POA: Insufficient documentation

## 2014-01-21 DIAGNOSIS — R490 Dysphonia: Principal | ICD-10-CM | POA: Diagnosis present

## 2014-01-21 DIAGNOSIS — J449 Chronic obstructive pulmonary disease, unspecified: Secondary | ICD-10-CM | POA: Insufficient documentation

## 2014-01-21 DIAGNOSIS — Z7982 Long term (current) use of aspirin: Secondary | ICD-10-CM | POA: Insufficient documentation

## 2014-01-21 DIAGNOSIS — E119 Type 2 diabetes mellitus without complications: Secondary | ICD-10-CM | POA: Insufficient documentation

## 2014-01-21 HISTORY — DX: Adverse effect of unspecified anesthetic, initial encounter: T41.45XA

## 2014-01-21 HISTORY — DX: Other complications of anesthesia, initial encounter: T88.59XA

## 2014-01-21 HISTORY — PX: LARYNGOSCOPY: SHX5203

## 2014-01-21 LAB — GLUCOSE, CAPILLARY
Glucose-Capillary: 123 mg/dL — ABNORMAL HIGH (ref 70–99)
Glucose-Capillary: 156 mg/dL — ABNORMAL HIGH (ref 70–99)
Glucose-Capillary: 143 mg/dL — ABNORMAL HIGH (ref 70–99)
Glucose-Capillary: 204 mg/dL — ABNORMAL HIGH (ref 70–99)

## 2014-01-21 SURGERY — LARYNGOSCOPY
Anesthesia: General | Site: Throat

## 2014-01-21 MED ORDER — CEFAZOLIN SODIUM-DEXTROSE 2-3 GM-% IV SOLR
INTRAVENOUS | Status: DC | PRN
  Administered 2014-01-21: 2 g via INTRAVENOUS

## 2014-01-21 MED ORDER — METOPROLOL TARTRATE 25 MG PO TABS
25.0000 mg | ORAL_TABLET | Freq: Two times a day (BID) | ORAL | Status: DC
  Administered 2014-01-21 – 2014-01-22 (×2): 25 mg via ORAL
  Filled 2014-01-21 (×3): qty 1

## 2014-01-21 MED ORDER — REMIFENTANIL HCL 1 MG IV SOLR
INTRAVENOUS | Status: DC | PRN
  Administered 2014-01-21: 40 ug via INTRAVENOUS

## 2014-01-21 MED ORDER — PANTOPRAZOLE SODIUM 40 MG PO TBEC
40.0000 mg | DELAYED_RELEASE_TABLET | Freq: Two times a day (BID) | ORAL | Status: DC
  Administered 2014-01-21 – 2014-01-22 (×2): 40 mg via ORAL
  Filled 2014-01-21 (×2): qty 1

## 2014-01-21 MED ORDER — FUROSEMIDE 20 MG PO TABS
20.0000 mg | ORAL_TABLET | Freq: Every day | ORAL | Status: DC
  Administered 2014-01-21: 20 mg via ORAL
  Filled 2014-01-21 (×2): qty 1

## 2014-01-21 MED ORDER — PROMETHAZINE HCL 25 MG/ML IJ SOLN: 6.2500 mg | INTRAMUSCULAR | Status: DC | PRN

## 2014-01-21 MED ORDER — POTASSIUM CHLORIDE IN NACL 20-0.45 MEQ/L-% IV SOLN
INTRAVENOUS | Status: DC
  Administered 2014-01-21: 18:00:00 via INTRAVENOUS
  Filled 2014-01-21 (×4): qty 1000

## 2014-01-21 MED ORDER — TIOTROPIUM BROMIDE MONOHYDRATE 18 MCG IN CAPS
18.0000 ug | ORAL_CAPSULE | Freq: Every day | RESPIRATORY_TRACT | Status: DC
  Administered 2014-01-22: 18 ug via RESPIRATORY_TRACT
  Filled 2014-01-21: qty 5

## 2014-01-21 MED ORDER — POTASSIUM CHLORIDE CRYS ER 20 MEQ PO TBCR
40.0000 meq | EXTENDED_RELEASE_TABLET | Freq: Every day | ORAL | Status: DC
  Filled 2014-01-21: qty 2

## 2014-01-21 MED ORDER — PHENYLEPHRINE HCL 10 MG/ML IJ SOLN
10.0000 mg | INTRAVENOUS | Status: DC | PRN
  Administered 2014-01-21: 20 ug/min via INTRAVENOUS

## 2014-01-21 MED ORDER — FENTANYL CITRATE 0.05 MG/ML IJ SOLN
INTRAMUSCULAR | Status: AC
  Filled 2014-01-21: qty 5

## 2014-01-21 MED ORDER — FOLIC ACID 1 MG PO TABS
1.0000 mg | ORAL_TABLET | Freq: Every day | ORAL | Status: DC
  Administered 2014-01-21 – 2014-01-22 (×2): 1 mg via ORAL
  Filled 2014-01-21 (×2): qty 1

## 2014-01-21 MED ORDER — HYDROMORPHONE HCL PF 1 MG/ML IJ SOLN
INTRAMUSCULAR | Status: AC
  Filled 2014-01-21: qty 1

## 2014-01-21 MED ORDER — PROMETHAZINE HCL 25 MG RE SUPP: 12.5000 mg | Freq: Four times a day (QID) | RECTAL | Status: DC | PRN

## 2014-01-21 MED ORDER — FUROSEMIDE 20 MG PO TABS
40.0000 mg | ORAL_TABLET | Freq: Every day | ORAL | Status: DC
  Administered 2014-01-22: 40 mg via ORAL
  Filled 2014-01-21: qty 2

## 2014-01-21 MED ORDER — METHOTREXATE 2.5 MG PO TABS: 20.0000 mg | ORAL_TABLET | ORAL | Status: DC

## 2014-01-21 MED ORDER — AMITRIPTYLINE HCL 75 MG PO TABS
75.0000 mg | ORAL_TABLET | Freq: Every day | ORAL | Status: DC
  Administered 2014-01-21: 75 mg via ORAL
  Filled 2014-01-21 (×3): qty 1

## 2014-01-21 MED ORDER — OXYCODONE HCL 5 MG PO TABS: 2.5000 mg | ORAL_TABLET | Freq: Four times a day (QID) | ORAL | Status: DC | PRN

## 2014-01-21 MED ORDER — MOMETASONE FURO-FORMOTEROL FUM 100-5 MCG/ACT IN AERO
2.0000 | INHALATION_SPRAY | Freq: Two times a day (BID) | RESPIRATORY_TRACT | Status: DC
  Administered 2014-01-21 – 2014-01-22 (×2): 2 via RESPIRATORY_TRACT
  Filled 2014-01-21: qty 8.8

## 2014-01-21 MED ORDER — POTASSIUM CHLORIDE IN NACL 20-0.45 MEQ/L-% IV SOLN
INTRAVENOUS | Status: DC
  Filled 2014-01-21 (×3): qty 1000

## 2014-01-21 MED ORDER — ALBUTEROL 90 MCG/ACT IN AERS: 2.0000 | INHALATION_SPRAY | RESPIRATORY_TRACT | Status: DC | PRN

## 2014-01-21 MED ORDER — OXYCODONE HCL 5 MG/5ML PO SOLN
5.0000 mg | Freq: Once | ORAL | Status: AC | PRN
  Administered 2014-01-21: 5 mg via ORAL

## 2014-01-21 MED ORDER — VITAMIN B-12 250 MCG PO TABS: 250.0000 ug | ORAL_TABLET | Freq: Every day | ORAL | Status: DC

## 2014-01-21 MED ORDER — ASPIRIN 81 MG PO TABS: 81.0000 mg | ORAL_TABLET | Freq: Every day | ORAL | Status: DC

## 2014-01-21 MED ORDER — DEXAMETHASONE SODIUM PHOSPHATE 10 MG/ML IJ SOLN
INTRAMUSCULAR | Status: DC | PRN
  Administered 2014-01-21: 10 mg via INTRAVENOUS

## 2014-01-21 MED ORDER — PROMETHAZINE HCL 25 MG PO TABS: 12.5000 mg | ORAL_TABLET | Freq: Four times a day (QID) | ORAL | Status: DC | PRN

## 2014-01-21 MED ORDER — OXYMETAZOLINE HCL 0.05 % NA SOLN
NASAL | Status: DC | PRN
  Administered 2014-01-21: 1

## 2014-01-21 MED ORDER — PROPOFOL INFUSION 10 MG/ML OPTIME
INTRAVENOUS | Status: DC | PRN
  Administered 2014-01-21: 200 ug/kg/min via INTRAVENOUS

## 2014-01-21 MED ORDER — POTASSIUM CHLORIDE CRYS ER 20 MEQ PO TBCR
20.0000 meq | EXTENDED_RELEASE_TABLET | Freq: Every day | ORAL | Status: DC
  Administered 2014-01-21: 20 meq via ORAL
  Filled 2014-01-21 (×2): qty 1

## 2014-01-21 MED ORDER — OXYCODONE-ACETAMINOPHEN 5-325 MG PO TABS: 1.0000 | ORAL_TABLET | Freq: Four times a day (QID) | ORAL | Status: DC | PRN

## 2014-01-21 MED ORDER — OXYCODONE-ACETAMINOPHEN 7.5-325 MG PO TABS: 1.0000 | ORAL_TABLET | Freq: Four times a day (QID) | ORAL | Status: DC | PRN

## 2014-01-21 MED ORDER — HYDROMORPHONE HCL PF 1 MG/ML IJ SOLN
0.2500 mg | INTRAMUSCULAR | Status: DC | PRN
  Administered 2014-01-21: 0.5 mg via INTRAVENOUS

## 2014-01-21 MED ORDER — PREGABALIN 75 MG PO CAPS: 150.0000 mg | ORAL_CAPSULE | Freq: Two times a day (BID) | ORAL | Status: DC

## 2014-01-21 MED ORDER — 0.9 % SODIUM CHLORIDE (POUR BTL) OPTIME
TOPICAL | Status: DC | PRN
  Administered 2014-01-21: 1000 mL

## 2014-01-21 MED ORDER — VITAMIN C 500 MG PO TABS
500.0000 mg | ORAL_TABLET | Freq: Every day | ORAL | Status: DC
  Administered 2014-01-21 – 2014-01-22 (×2): 500 mg via ORAL
  Filled 2014-01-21 (×2): qty 1

## 2014-01-21 MED ORDER — ALBUTEROL SULFATE (2.5 MG/3ML) 0.083% IN NEBU: 2.5000 mg | INHALATION_SOLUTION | RESPIRATORY_TRACT | Status: DC | PRN

## 2014-01-21 MED ORDER — OXYMETAZOLINE HCL 0.05 % NA SOLN
NASAL | Status: AC
  Filled 2014-01-21: qty 15

## 2014-01-21 MED ORDER — SUCCINYLCHOLINE CHLORIDE 20 MG/ML IJ SOLN
INTRAMUSCULAR | Status: DC | PRN
  Administered 2014-01-21: 100 mg via INTRAVENOUS

## 2014-01-21 MED ORDER — OXYCODONE HCL 5 MG PO TABS: 5.0000 mg | ORAL_TABLET | Freq: Once | ORAL | Status: AC | PRN

## 2014-01-21 MED ORDER — ASPIRIN EC 81 MG PO TBEC
81.0000 mg | DELAYED_RELEASE_TABLET | Freq: Every day | ORAL | Status: DC
  Administered 2014-01-21: 81 mg via ORAL
  Filled 2014-01-21 (×2): qty 1

## 2014-01-21 MED ORDER — OXYCODONE HCL 5 MG/5ML PO SOLN
ORAL | Status: AC
  Filled 2014-01-21: qty 5

## 2014-01-21 MED ORDER — LACTATED RINGERS IV SOLN
INTRAVENOUS | Status: DC
  Administered 2014-01-21: 10:00:00 via INTRAVENOUS

## 2014-01-21 MED ORDER — PREDNISONE 5 MG PO TABS
5.0000 mg | ORAL_TABLET | Freq: Every day | ORAL | Status: DC
  Administered 2014-01-22: 5 mg via ORAL
  Filled 2014-01-21: qty 1

## 2014-01-21 MED ORDER — INSULIN ASPART 100 UNIT/ML ~~LOC~~ SOLN
0.0000 [IU] | Freq: Three times a day (TID) | SUBCUTANEOUS | Status: DC
  Administered 2014-01-21 – 2014-01-22 (×2): 2 [IU] via SUBCUTANEOUS

## 2014-01-21 MED ORDER — EPINEPHRINE HCL (NASAL) 0.1 % NA SOLN
NASAL | Status: AC
  Filled 2014-01-21: qty 30

## 2014-01-21 MED ORDER — CYANOCOBALAMIN 250 MCG PO TABS
250.0000 ug | ORAL_TABLET | Freq: Every day | ORAL | Status: DC
  Administered 2014-01-21: 250 ug via ORAL
  Filled 2014-01-21 (×2): qty 1

## 2014-01-21 MED ORDER — MORPHINE SULFATE 2 MG/ML IJ SOLN: 2.0000 mg | INTRAMUSCULAR | Status: DC | PRN

## 2014-01-21 MED ORDER — LACTATED RINGERS IV SOLN
INTRAVENOUS | Status: DC | PRN
  Administered 2014-01-21: 12:00:00 via INTRAVENOUS

## 2014-01-21 MED ORDER — SODIUM CHLORIDE 0.9 % IJ SOLN
2000.0000 ug | INTRAVENOUS | Status: DC | PRN
  Administered 2014-01-21: .375 ug/kg/min via INTRAVENOUS

## 2014-01-21 MED ORDER — NITROGLYCERIN 0.4 MG SL SUBL: 0.4000 mg | SUBLINGUAL_TABLET | SUBLINGUAL | Status: DC | PRN

## 2014-01-21 MED ORDER — ATORVASTATIN CALCIUM 80 MG PO TABS
80.0000 mg | ORAL_TABLET | Freq: Every day | ORAL | Status: DC
  Administered 2014-01-21: 80 mg via ORAL
  Filled 2014-01-21 (×2): qty 1

## 2014-01-21 MED ORDER — FOLIC ACID 800 MCG PO TABS
800.0000 ug | ORAL_TABLET | Freq: Every day | ORAL | Status: DC
Start: 2014-01-21 — End: 2014-01-21

## 2014-01-21 MED ORDER — PREGABALIN 75 MG PO CAPS
150.0000 mg | ORAL_CAPSULE | Freq: Two times a day (BID) | ORAL | Status: DC
  Administered 2014-01-21 – 2014-01-22 (×2): 150 mg via ORAL
  Filled 2014-01-21 (×2): qty 6

## 2014-01-21 MED ORDER — PROPOFOL 10 MG/ML IV BOLUS
INTRAVENOUS | Status: DC | PRN
  Administered 2014-01-21: 40 mg via INTRAVENOUS

## 2014-01-21 SURGICAL SUPPLY — 33 items
BALLN PULM 15 16.5 18X75 (BALLOONS)
BALLOON PULM 15 16.5 18X75 (BALLOONS) IMPLANT
BLADE 10 SAFETY STRL DISP (BLADE) IMPLANT
CANISTER SUCTION 2500CC (MISCELLANEOUS) ×2 IMPLANT
CONT SPEC 4OZ CLIKSEAL STRL BL (MISCELLANEOUS) IMPLANT
COVER MAYO STAND STRL (DRAPES) ×2 IMPLANT
COVER TABLE BACK 60X90 (DRAPES) ×2 IMPLANT
CRADLE DONUT ADULT HEAD (MISCELLANEOUS) ×2 IMPLANT
DRAPE PROXIMA HALF (DRAPES) ×2 IMPLANT
GAUZE SPONGE 4X4 16PLY XRAY LF (GAUZE/BANDAGES/DRESSINGS) IMPLANT
GLOVE BIO SURGEON STRL SZ7.5 (GLOVE) ×2 IMPLANT
GLOVE BIOGEL PI IND STRL 6.5 (GLOVE) ×1 IMPLANT
GLOVE BIOGEL PI INDICATOR 6.5 (GLOVE) ×1
GOWN STRL REUS W/ TWL LRG LVL3 (GOWN DISPOSABLE) ×2 IMPLANT
GOWN STRL REUS W/TWL LRG LVL3 (GOWN DISPOSABLE) ×2
GUARD TEETH (MISCELLANEOUS) IMPLANT
KIT BASIN OR (CUSTOM PROCEDURE TRAY) ×2 IMPLANT
KIT PROLARN PLUS GEL W/NDL (Prosthesis and Implant ENT) ×2 IMPLANT
KIT ROOM TURNOVER OR (KITS) ×2 IMPLANT
MARKER SKIN DUAL TIP RULER LAB (MISCELLANEOUS) IMPLANT
NEEDLE TRANS ORAL INJECTION (NEEDLE) ×2 IMPLANT
NS IRRIG 1000ML POUR BTL (IV SOLUTION) ×2 IMPLANT
PAD ARMBOARD 7.5X6 YLW CONV (MISCELLANEOUS) ×2 IMPLANT
PAD EYE OVAL STERILE LF (GAUZE/BANDAGES/DRESSINGS) IMPLANT
PATTIES SURGICAL .5 X1 (DISPOSABLE) ×2 IMPLANT
PATTIES SURGICAL .5 X3 (DISPOSABLE) IMPLANT
SOLUTION ANTI FOG 6CC (MISCELLANEOUS) ×2 IMPLANT
SPONGE GAUZE 4X4 12PLY (GAUZE/BANDAGES/DRESSINGS) ×2 IMPLANT
STAPLER TA60 3.5 010317 (STAPLE) IMPLANT
SUT SILK 2 0 SH (SUTURE) IMPLANT
TOWEL OR 17X24 6PK STRL BLUE (TOWEL DISPOSABLE) ×2 IMPLANT
TUBE CONNECTING 12X1/4 (SUCTIONS) ×2 IMPLANT
WATER STERILE IRR 1000ML POUR (IV SOLUTION) IMPLANT

## 2014-01-21 NOTE — Brief Op Note (Signed)
01/21/2014  12:24 PM  PATIENT:  Joseph [name]  63 y.o. male  PRE-OPERATIVE DIAGNOSIS:  dysphonia  POST-OPERATIVE DIAGNOSIS:  same  PROCEDURE:  Procedure(s) with comments: LARYNGOSCOPY (N/A) - micro direct laryngoscopy with prolaryn injection/jet venturi ventilation  SURGEON:  Surgeon(s) and Role:    * Christia Reading, MD - Primary  PHYSICIAN ASSISTANT:   ASSISTANTS: none   ANESTHESIA:   general  EBL:     BLOOD ADMINISTERED:none  DRAINS: none   LOCAL MEDICATIONS USED:  NONE  SPECIMEN:  No Specimen  DISPOSITION OF SPECIMEN:  N/A  COUNTS:  YES  TOURNIQUET:  * No tourniquets in log *  DICTATION: .Other Dictation: Dictation Number [id number]  PLAN OF CARE: Admit for overnight observation  PATIENT DISPOSITION:  PACU - hemodynamically stable.   Delay start of Pharmacological VTE agent (>24hrs) due to surgical blood loss or risk of bleeding: no

## 2014-01-21 NOTE — Anesthesia Preprocedure Evaluation (Addendum)
Anesthesia Evaluation  Patient identified by MRN, date of birth, ID band Patient awake    History of Anesthesia Complications (+) PONV  Airway Mallampati: II  Neck ROM: Full    Dental  (+) Edentulous Upper   Pulmonary COPD oxygen dependent, former smoker,  breath sounds clear to auscultation        Cardiovascular hypertension, + CAD, + CABG and +CHF Rhythm:Regular Rate:Normal     Neuro/Psych  Neuromuscular disease    GI/Hepatic hiatal hernia, GERD-  ,Hx of gastroparesis   Endo/Other  diabetesMorbid obesity  Renal/GU      Musculoskeletal  (+) Arthritis -, Rheumatoid disorders,    Abdominal (+) + obese,   Peds  Hematology   Anesthesia Other Findings   Reproductive/Obstetrics                          Anesthesia Physical Anesthesia Plan  ASA: IV  Anesthesia Plan: General   Post-op Pain Management:    Induction: Intravenous  Airway Management Planned:   Additional Equipment:   Intra-op Plan:   Post-operative Plan: Extubation in OR and Possible Post-op intubation/ventilation  Informed Consent: I have reviewed the patients History and Physical, chart, labs and discussed the procedure including the risks, benefits and alternatives for the proposed anesthesia with the patient or authorized representative who has indicated his/her understanding and acceptance.     Plan Discussed with:   Anesthesia Plan Comments:        Anesthesia Quick Evaluation

## 2014-01-21 NOTE — Anesthesia Postprocedure Evaluation (Signed)
  Anesthesia Post-op Note  Patient: Joseph [name]  Procedure(s) Performed: Procedure(s) with comments: LARYNGOSCOPY (N/A) - micro direct laryngoscopy with prolaryn injection/jet venturi ventilation  Patient Location: PACU  Anesthesia Type:General  Level of Consciousness: awake and alert   Airway and Oxygen Therapy: Patient Spontanous Breathing and Patient connected to nasal cannula oxygen  Post-op Pain: none  Post-op Assessment: Post-op Vital signs reviewed, Patient's Cardiovascular Status Stable and Respiratory Function Stable  Post-op Vital Signs: stable  Last Vitals:  Filed Vitals:   01/21/14 1330  BP: 148/78  Pulse: 77  Temp: 36.9 C  Resp: 11    Complications: No apparent anesthesia complications

## 2014-01-21 NOTE — H&P (Signed)
Joseph [name] is an 63 y.o. male.   Chief Complaint: Dysphonia HPI:63 year old male with one year of worsening hoarseness.   Fiberoptic exam demonstrated presbylaryngis.  Presents for injection augmentation.  Past Medical History  Diagnosis Date  . Rheumatoid arthritis(714.0)   . Psoriasis   . Hypertension   . Gastroesophageal reflux disease   . COPD (chronic obstructive pulmonary disease)   . CAD (coronary artery disease)     MI 2007, CABG  . Osteoporosis     per DEXA 05-2008  . Peripheral polyneuropathy     Severe, causing problems with his feet, see surgeries  . Diabetes mellitus     Dx 07-2008 A1C 6.2  . Onychomycosis     toenails  . PONV (postoperative nausea and vomiting)   . Hiatal hernia   . Esophageal dysmotility   . Gastroparesis   . Fatty liver 10/11/09  . Pulmonary fibrosis   . Neuropathy     (related to RA per neuro note 05-2011)  . Oxygen dependent     3l  . CHF (congestive heart failure)     pt stated he doesnt have this    Past Surgical History  Procedure Laterality Date  . Foot surgery      to tendon release and repair of osteomyelitis   . Cholecystectomy    . Coronary artery bypass graft  2007    (LIMA to the LAD, left radial to obtuse marginal, SVG to first diagonal, SVG to PDA. His ejection fraction to 50-55%  . Achiles tendon surgery  8/09  . Toe amputation  4-12/ 3 14    d/t a deformity, found to have osteomyelitis, complicated by post-op foot strtess FX  . Esophagogastroduodenoscopy N/A 10/19/2012    Procedure: ESOPHAGOGASTRODUODENOSCOPY (EGD);  Surgeon: Hart Carwin, MD;  Location: [facility] ENDOSCOPY;  Service: Endoscopy;  Laterality: N/A;  the dilatation is possible.     Family History  Problem Relation Age of Onset  . Heart disease Mother   . Diverticulitis Mother   . Heart disease Father   . Heart attack Father     x2  . Colon cancer Neg Hx   . Prostate cancer Neg Hx    Social History:  reports that he quit smoking about 8 years ago. He  quit smokeless tobacco use about 7 years ago. He reports that he does not drink alcohol. His drug history is not on file.  Allergies: No Known Allergies  Medications Prior to Admission  Medication Sig Dispense Refill  . amitriptyline (ELAVIL) 75 MG tablet Take 75 mg by mouth at bedtime.       . Ascorbic Acid (VITAMIN C) 500 MG tablet Take 500 mg by mouth daily.        Marland Kitchen aspirin 81 MG tablet Take 81 mg by mouth daily.        Marland Kitchen atorvastatin (LIPITOR) 80 MG tablet Take 1 tablet (80 mg total) by mouth daily at 6 PM.  30 tablet  6  . Carbonyl Iron (PERFECT IRON PO) Take 1 tablet by mouth daily.       . Fluticasone-Salmeterol (ADVAIR) 100-50 MCG/DOSE AEPB Inhale 1 puff into the lungs 2 (two) times daily. Gargle with water after every use      . folic acid (FOLVITE) 800 MCG tablet Take 800 mcg by mouth daily.      . furosemide (LASIX) 20 MG tablet Take 20-40 mg by mouth 2 (two) times daily. 40 mg in the morning and 20  mg in the evening      . Lancets (ONETOUCH ULTRASOFT) lancets USE AS DIRECTED  100 each  3  . methotrexate (RHEUMATREX) 2.5 MG tablet Take 20 mg by mouth once a week. 8 tablets every Thursday      . metoprolol tartrate (LOPRESSOR) 25 MG tablet Take 1 tablet (25 mg total) by mouth 2 (two) times daily.  180 tablet  3  . Misc. Devices (ACAPELLA) MISC Use as directed  1 each  0  . NON FORMULARY EVERGO PORTABLE PULSE DOSE OXYGEN oxygen 3L per nasal cannula 24 hours a day Dx: 496      . ONE TOUCH ULTRA TEST test strip USE AS DIRECTED ONCE DAILY  50 each  12  . oxyCODONE-acetaminophen (PERCOCET) 7.5-325 MG per tablet Take 1 tablet by mouth. 3 times a day      . pantoprazole (PROTONIX) 40 MG tablet Take 40 mg by mouth 2 (two) times daily.      . potassium chloride SA (K-DUR,KLOR-CON) 20 MEQ tablet Take 20-40 mEq by mouth 2 (two) times daily. 40 meq in the morning and 20 meq at 4 pm      . predniSONE (DELTASONE) 5 MG tablet Take 5 mg by mouth daily.      . pregabalin (LYRICA) 150 MG capsule  Take 150 mg by mouth 2 (two) times daily.       Marland Kitchen Respiratory Therapy Supplies (FLUTTER) DEVI Blow through several times, 2 or 3 sets per day as needed  1 each  0  . Respiratory Therapy Supplies (FLUTTER) DEVI Use as directed  1 each  0  . tiotropium (SPIRIVA HANDIHALER) 18 MCG inhalation capsule Place 1 capsule (18 mcg total) into inhaler and inhale daily. 1 puff in AM  30 capsule  6  . UNABLE TO FIND Med Name: Incentive Spirometer Use as directed  1 Device  0  . vitamin B-12 (CYANOCOBALAMIN) 250 MCG tablet Take 250 mcg by mouth daily.        Marland Kitchen albuterol (PROVENTIL,VENTOLIN) 90 MCG/ACT inhaler Inhale 2 puffs into the lungs every 4 (four) hours as needed for wheezing or shortness of breath.  17 g  prn  . nitroGLYCERIN (NITROSTAT) 0.4 MG SL tablet Place 1 tablet (0.4 mg total) under the tongue every 5 (five) minutes as needed for chest pain.  25 tablet  1    Results for orders placed during the hospital encounter of 01/21/14 (from the past 48 hour(s))  GLUCOSE, CAPILLARY     Status: Abnormal   Collection Time    01/21/14  9:46 AM      Result Value Ref Range   Glucose-Capillary 123 (*) 70 - 99 mg/dL   No results found.  Review of Systems  All other systems reviewed and are negative.   Blood pressure 115/75, pulse 74, temperature 97.8 F (36.6 C), temperature source Oral, resp. rate 20, height 6\' 1"  (1.854 m), weight 111.585 kg (246 lb), SpO2 97.00%. Physical Exam  Constitutional: He is oriented to person, place, and time. He appears well-developed and well-nourished. No distress.  HENT:  Head: Normocephalic and atraumatic.  Right Ear: External ear normal.  Left Ear: External ear normal.  Nose: Nose normal.  Mouth/Throat: Oropharynx is clear and moist.  Hoarse.  Eyes: Conjunctivae and EOM are normal. Pupils are equal, round, and reactive to light.  Neck: Normal range of motion. Neck supple.  Cardiovascular: Normal rate.   Respiratory: Effort normal.  Musculoskeletal: Normal range  of motion.  Neurological:  He is alert and oriented to person, place, and time. No cranial nerve deficit.  Skin: Skin is warm and dry.  Psychiatric: He has a normal mood and affect. His behavior is normal. Judgment and thought content normal.     Assessment/Plan Dysphonia To OR for SMDL with Prolaryn injections.  Plan overnight observation due to pulmonary disease.  Christia Reading 01/21/2014, 11:42 AM

## 2014-01-21 NOTE — Transfer of Care (Signed)
Immediate Anesthesia Transfer of Care Note  Patient: Joseph [name]  Procedure(s) Performed: Procedure(s) with comments: LARYNGOSCOPY (N/A) - micro direct laryngoscopy with prolaryn injection/jet venturi ventilation  Patient Location: PACU  Anesthesia Type:General  Level of Consciousness: awake, alert  and oriented  Airway & Oxygen Therapy: Patient Spontanous Breathing and Patient connected to nasal cannula oxygen  Post-op Assessment: Report given to PACU RN and Post -op Vital signs reviewed and stable  Post vital signs: Reviewed and stable  Complications: No apparent anesthesia complications

## 2014-01-21 NOTE — Progress Notes (Signed)
Report given to Robin RN

## 2014-01-22 LAB — GLUCOSE, CAPILLARY: Glucose-Capillary: 137 mg/dL — ABNORMAL HIGH (ref 70–99)

## 2014-01-22 NOTE — Progress Notes (Signed)
1 Day Post-Op  Subjective: He is doing great. Voice is excellent. He is swallowing well. No breathing problems  Objective: Vital signs in last 24 hours: Temp:  [97.2 F (36.2 C)-98.5 F (36.9 C)] 97.9 F (36.6 C) (05/30 8144) Pulse Rate:  [73-96] 95 (05/30 0613) Resp:  [9-20] 19 (05/30 0613) BP: (113-150)/(69-94) 142/89 mmHg (05/30 0613) SpO2:  [93 %-99 %] 98 % (05/30 0613) FiO2 (%):  [28 %] 28 % (05/29 1445) Weight:  [111.585 kg (246 lb)] 111.585 kg (246 lb) (05/29 0948) Last BM Date: 01/20/14  Intake/Output from previous day: 05/29 0701 - 05/30 0700 In: 2193.8 [P.O.:730; I.V.:1463.8] Out: 2100 [Urine:2100] Intake/Output this shift:    awake and alert. no sttridor  voice is normal. no swelling. cv regular. lungs- clear. ext- no swelling ior pain.  Lab Results:  No results found for this basename: WBC, HGB, HCT, PLT,  in the last 72 hours BMET No results found for this basename: NA, K, CL, CO2, GLUCOSE, BUN, CREATININE, CALCIUM,  in the last 72 hours PT/INR No results found for this basename: LABPROT, INR,  in the last 72 hours ABG No results found for this basename: PHART, PCO2, PO2, HCO3,  in the last 72 hours  Studies/Results: No results found.  Anti-infectives: Anti-infectives   None      Assessment/Plan: s/p Procedure(s) with comments: LARYNGOSCOPY (N/A) - micro direct laryngoscopy with prolaryn injection/jet venturi ventilation Discharge fu next week  LOS: 1 day    Suzanna Obey 01/22/2014

## 2014-01-22 NOTE — Op Note (Signed)
NAMELIVIO, LEDWITH NO.:  [id number]  MEDICAL RECORD NO.:  [id number]  LOCATION:  [id number]                        FACILITY:  [facility]  PHYSICIAN:  Antony Contras, MD     DATE OF BIRTH:  1951/06/10  DATE OF PROCEDURE:  01/21/2014 DATE OF DISCHARGE:                              OPERATIVE REPORT   PREOPERATIVE DIAGNOSIS:  Dysphonia and presbylarynges.  POSTOPERATIVE DIAGNOSIS:  Dysphonia and presbylarynges.  PROCEDURE:  Suspended microdirect laryngoscopy with bilateral vocal fold radius injections.  SURGEON:  Excell Seltzer. Jenne Pane, MD  ANESTHESIA:  General, jet Venturi ventilation.  COMPLICATIONS:  None.  INDICATION:  The patient is a 63 year old male who has a 1 year history of worsening dysphonia and was found on fiberoptic exam to have bowed vocal folds and no other lesions.  He presents to the operating room for surgical management.  FINDINGS:  Both vocal folds were found to have no lesions or physical abnormalities except that they were thin and bowed bilaterally.  A 0.35 mL of radius was placed in each vocal fold.  DESCRIPTION OF PROCEDURE:  The patient was identified in the holding room and informed consent having been obtained including discussion of risks, benefits, alternatives, the patient was brought to the operative suite, put on the table in supine position.  The bed was turned 90 degrees from Anesthesia and anesthesia was induced.  He was maintained under mask ventilation initially.  The eyes stayed closed and when he was under full anesthesia, a damp gauze was placed over the upper gum and a Storz laryngoscope was inserted into the supraglottic position. This was placed in suspension on the Mayo stand using a Lewy arm.  Jet Venturi ventilation was then initiated.  A 0-degree telescope was used to make a preoperative photograph.  The radius was then injected under the operating microscope, first on the left side starting with 0.25 mL and then on the  right side with the same amount.  The injections were placed lateral to the vocal process on each side.  An additional 0.1 mL was placed in each side for additional filling.  After this was completed, the airway was suctioned and the telescope was used to make another photograph.  Lidocaine was then sprayed topically onto the larynx and the airway was suctioned while the laryngoscope was taken out of suspension and removed from the patient's mouth.  He was turned back to Anesthesia under mask ventilation.  He was awoken and moved to recovery room in stable condition.     Antony Contras, MD     DDB/MEDQ  D:  01/21/2014  T:  01/22/2014  Job:  [id number]

## 2014-01-23 ENCOUNTER — Other Ambulatory Visit: Payer: Self-pay | Admitting: Internal Medicine

## 2014-01-24 ENCOUNTER — Encounter (HOSPITAL_COMMUNITY): Payer: Self-pay | Admitting: Otolaryngology

## 2014-01-24 ENCOUNTER — Other Ambulatory Visit: Payer: Self-pay | Admitting: Internal Medicine

## 2014-01-25 ENCOUNTER — Other Ambulatory Visit: Payer: Self-pay | Admitting: *Deleted

## 2014-01-25 MED ORDER — GLUCOSE BLOOD VI STRP: ORAL_STRIP | Status: DC

## 2014-01-25 NOTE — Telephone Encounter (Signed)
VS patient now-will forward to him.

## 2014-02-22 NOTE — Discharge Summary (Signed)
Physician Discharge Summary  Patient ID: Joseph [name] MRN: [id number] DOB/AGE: 1950-11-04 63 y.o.  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  See operative note.  He was observed overnight due to comorbidities and did well with no breathing problems or other complaints. On POD 1, he was felt stable for discharge.  Consults: None  Significant Diagnostic Studies: None  Treatments: surgery: Suspended microlaryngoscopy with Prolaryn injections  Discharge Exam: Blood pressure 142/89, pulse 95, temperature 97.9 F (36.6 C), temperature source Oral, resp. rate 19, height 6\' 1"  (1.854 m), weight 111.585 kg (246 lb), SpO2 95.00%. General appearance: alert, cooperative and no distress Throat: good voice and no stridor  Disposition: 01-Home or Self Care  Discharge Instructions   Call MD for:  difficulty breathing, headache or visual disturbances    Complete by:  As directed      Call MD for:  extreme fatigue    Complete by:  As directed      Call MD for:  hives    Complete by:  As directed      Call MD for:  persistant dizziness or light-headedness    Complete by:  As directed      Call MD for:  persistant nausea and vomiting    Complete by:  As directed      Call MD for:  redness, tenderness, or signs of infection (pain, swelling, redness, odor or green/yellow discharge around incision site)    Complete by:  As directed      Call MD for:  severe uncontrolled pain    Complete by:  As directed      Call MD for:  temperature >100.4    Complete by:  As directed      Diet - low sodium heart healthy    Complete by:  As directed      Discharge instructions    Complete by:  As directed   Call if any breathing or bleeding problems. Follow up by calling office Monday to check time.     Increase activity slowly    Complete by:  As directed             Medication List         ACAPELLA Misc  Use as directed     albuterol 90 MCG/ACT inhaler  Commonly known as:  PROVENTIL,VENTOLIN  Inhale 2 puffs into the lungs every 4 (four) hours as needed for wheezing or shortness of breath.     amitriptyline 75 MG tablet  Commonly known as:  ELAVIL  Take 75 mg by mouth at bedtime.     aspirin 81 MG tablet  Take 81 mg by mouth daily.     atorvastatin 80 MG tablet  Commonly known as:  LIPITOR  Take 1 tablet (80 mg total) by mouth daily at 6 PM.     FLUTTER Devi  Blow through several times, 2 or 3 sets per day as needed     FLUTTER Devi  Use as directed     folic acid 800 MCG tablet  Commonly known as:  FOLVITE  Take 800 mcg by mouth daily.     furosemide 20 MG tablet  Commonly known as:  LASIX  Take 20-40 mg  by mouth 2 (two) times daily. 40 mg in the morning and 20 mg in the evening     methotrexate 2.5 MG tablet  Commonly known as:  RHEUMATREX  Take 20 mg by mouth once a week. 8 tablets every Thursday     metoprolol tartrate 25 MG tablet  Commonly known as:  LOPRESSOR  Take 1 tablet (25 mg total) by mouth 2 (two) times daily.     nitroGLYCERIN 0.4 MG SL tablet  Commonly known as:  NITROSTAT  Place 1 tablet (0.4 mg total) under the tongue every 5 (five) minutes as needed for chest pain.     NON FORMULARY  - EVERGO PORTABLE PULSE DOSE OXYGEN  - oxygen 3L per nasal cannula 24 hours a day  - Dx: 496     oxyCODONE-acetaminophen 7.5-325 MG per tablet  Commonly known as:  PERCOCET  Take 1 tablet by mouth. 3 times a day     pantoprazole 40 MG tablet  Commonly known as:  PROTONIX  Take 40 mg by mouth 2 (two) times daily.     PERFECT IRON PO  Take 1 tablet by mouth daily.     potassium chloride SA 20 MEQ tablet  Commonly known as:  K-DUR,KLOR-CON  Take  20-40 mEq by mouth 2 (two) times daily. 40 meq in the morning and 20 meq at 4 pm     predniSONE 5 MG tablet  Commonly known as:  DELTASONE  Take 5 mg by mouth daily.     pregabalin 150 MG capsule  Commonly known as:  LYRICA  Take 150 mg by mouth 2 (two) times daily.     tiotropium 18 MCG inhalation capsule  Commonly known as:  SPIRIVA HANDIHALER  Place 1 capsule (18 mcg total) into inhaler and inhale daily. 1 puff in AM     UNABLE TO FIND  - Med Name: Incentive Spirometer  - Use as directed     vitamin B-12 250 MCG tablet  Commonly known as:  CYANOCOBALAMIN  Take 250 mcg by mouth daily.     vitamin C 500 MG tablet  Commonly known as:  ASCORBIC ACID  Take 500 mg by mouth daily.           Follow-up Information   Follow up with BATES, DWIGHT, MD. Schedule an appointment as soon as possible for a visit in 2 weeks.   Specialty:  Otolaryngology   Contact information:   [address] [place] [address] [phone]       Signed: Christia Reading 02/22/2014, 6:14 PM

## 2014-03-23 ENCOUNTER — Emergency Department (HOSPITAL_BASED_OUTPATIENT_CLINIC_OR_DEPARTMENT_OTHER)
Admission: EM | Admit: 2014-03-23 | Discharge: 2014-03-23 | Disposition: A | Discharge status: Home / Self Care | Payer: BC Managed Care – PPO | Attending: Emergency Medicine | Admitting: Emergency Medicine

## 2014-03-23 ENCOUNTER — Encounter (HOSPITAL_BASED_OUTPATIENT_CLINIC_OR_DEPARTMENT_OTHER): Payer: Self-pay | Admitting: Emergency Medicine

## 2014-03-23 DIAGNOSIS — Z7982 Long term (current) use of aspirin: Secondary | ICD-10-CM | POA: Insufficient documentation

## 2014-03-23 DIAGNOSIS — Z79899 Other long term (current) drug therapy: Secondary | ICD-10-CM | POA: Insufficient documentation

## 2014-03-23 DIAGNOSIS — Z87891 Personal history of nicotine dependence: Secondary | ICD-10-CM | POA: Insufficient documentation

## 2014-03-23 DIAGNOSIS — Z951 Presence of aortocoronary bypass graft: Secondary | ICD-10-CM | POA: Insufficient documentation

## 2014-03-23 DIAGNOSIS — I10 Essential (primary) hypertension: Secondary | ICD-10-CM | POA: Insufficient documentation

## 2014-03-23 DIAGNOSIS — J449 Chronic obstructive pulmonary disease, unspecified: Secondary | ICD-10-CM | POA: Insufficient documentation

## 2014-03-23 DIAGNOSIS — Z8619 Personal history of other infectious and parasitic diseases: Secondary | ICD-10-CM | POA: Insufficient documentation

## 2014-03-23 DIAGNOSIS — I509 Heart failure, unspecified: Secondary | ICD-10-CM | POA: Insufficient documentation

## 2014-03-23 DIAGNOSIS — J4489 Other specified chronic obstructive pulmonary disease: Secondary | ICD-10-CM | POA: Insufficient documentation

## 2014-03-23 DIAGNOSIS — Z8669 Personal history of other diseases of the nervous system and sense organs: Secondary | ICD-10-CM | POA: Insufficient documentation

## 2014-03-23 DIAGNOSIS — M25569 Pain in unspecified knee: Secondary | ICD-10-CM | POA: Insufficient documentation

## 2014-03-23 DIAGNOSIS — I252 Old myocardial infarction: Secondary | ICD-10-CM | POA: Insufficient documentation

## 2014-03-23 DIAGNOSIS — Z872 Personal history of diseases of the skin and subcutaneous tissue: Secondary | ICD-10-CM | POA: Insufficient documentation

## 2014-03-23 DIAGNOSIS — Z8739 Personal history of other diseases of the musculoskeletal system and connective tissue: Secondary | ICD-10-CM | POA: Insufficient documentation

## 2014-03-23 DIAGNOSIS — IMO0002 Reserved for concepts with insufficient information to code with codable children: Secondary | ICD-10-CM | POA: Insufficient documentation

## 2014-03-23 DIAGNOSIS — K219 Gastro-esophageal reflux disease without esophagitis: Secondary | ICD-10-CM | POA: Insufficient documentation

## 2014-03-23 DIAGNOSIS — M069 Rheumatoid arthritis, unspecified: Secondary | ICD-10-CM | POA: Insufficient documentation

## 2014-03-23 DIAGNOSIS — I2510 Atherosclerotic heart disease of native coronary artery without angina pectoris: Secondary | ICD-10-CM | POA: Insufficient documentation

## 2014-03-23 DIAGNOSIS — E119 Type 2 diabetes mellitus without complications: Secondary | ICD-10-CM | POA: Insufficient documentation

## 2014-03-23 MED ORDER — HYDROMORPHONE HCL PF 1 MG/ML IJ SOLN
1.0000 mg | Freq: Once | INTRAMUSCULAR | Status: DC | PRN
Start: 2014-03-23 — End: 2014-03-23

## 2014-03-23 MED ORDER — ONDANSETRON 4 MG PO TBDP
4.0000 mg | ORAL_TABLET | Freq: Once | ORAL | Status: AC
  Administered 2014-03-23: 4 mg via ORAL
  Filled 2014-03-23: qty 1

## 2014-03-23 MED ORDER — DEXAMETHASONE SODIUM PHOSPHATE 10 MG/ML IJ SOLN
10.0000 mg | Freq: Once | INTRAMUSCULAR | Status: AC
  Administered 2014-03-23: 10 mg via INTRAMUSCULAR
  Filled 2014-03-23: qty 1

## 2014-03-23 MED ORDER — HYDROMORPHONE HCL PF 1 MG/ML IJ SOLN
1.0000 mg | Freq: Once | INTRAMUSCULAR | Status: AC
  Administered 2014-03-23: 1 mg via INTRAMUSCULAR

## 2014-03-23 MED ORDER — HYDROMORPHONE HCL PF 1 MG/ML IJ SOLN
1.0000 mg | Freq: Once | INTRAMUSCULAR | Status: DC
  Filled 2014-03-23: qty 1

## 2014-03-23 MED ORDER — METHYLPREDNISOLONE 4 MG PO KIT: PACK | ORAL | Status: DC

## 2014-03-23 MED ORDER — OXYCODONE HCL 10 MG PO TABS: 5.0000 mg | ORAL_TABLET | ORAL | Status: DC | PRN

## 2014-03-23 NOTE — Discharge Instructions (Signed)
Rheumatoid Arthritis °Rheumatoid arthritis is a disease that causes pain, puffiness (swelling), and stiffness of the joints. It is a long-term (chronic) disease. It can affect the whole body, even the eyes and lungs.  °HOME CARE °· Stay active, but lessen activity when the disease gets worse. °· Eat healthy foods. °· Put heat on the affected joints when you wake up and before activity. Keep the heat on for as long as told by your doctor. °· Put ice on the affected joints after activity or exercise. °¨ Put ice in a plastic bag. °¨ Place a towel between your skin and the bag. °¨ Leave the ice on for 15-20 minutes, 03-04 times a day. °· Take all medicine and other dietary pills (supplements) as told by your doctor. °· Use a splint as told by your doctor. Splints help keep joints in a certain position to keep the joint working right. °· Do not sleep with pillows under your knees. °· Go to programs that can keep you updated on treatments and ways to deal with your disease. °GET HELP RIGHT AWAY IF: °· You have times where you pass out (faint). °· You have times where you are really weak. °· You suddenly have a hot, painful joint that feels worse than your normal joint ache. °· You have chills. °· You have a fever. °Document Released: 11/04/2011 Document Revised: 12/27/2013 Document Reviewed: 11/04/2011 °[employer]® Patient Information ©2015 [employer]. This information is not intended to replace advice given to you by your health care provider. Make sure you discuss any questions you have with your health care provider. ° °

## 2014-03-23 NOTE — ED Provider Notes (Signed)
CSN: [id number]     Arrival date & time 03/23/14  0909 History   First MD Initiated Contact with Patient 03/23/14 0914     Chief Complaint  Patient presents with  . Knee Pain      HPI  Patient presents with joint pain. Has a history of rheumatoid arthritis. States she's only had 2 prior significant flareups. Diagnosis age 63. Is on 5 mg prednisone daily, and methotrexate every Thursday. He and his wife were in [place] over the last week. 340s ago he started getting significant pain in both knees, wrists, both ankles and feet. With your [place] and he states they told him they would not prescribe steroids. He drove home to [facility] yesterday. He was quite stiff and sore after treatment. When he tried to get up and around this morning his knees were so sore he thought he cannot walk.  Past Medical History  Diagnosis Date  . Rheumatoid arthritis(714.0)   . Psoriasis   . Hypertension   . Gastroesophageal reflux disease   . COPD (chronic obstructive pulmonary disease)   . CAD (coronary artery disease)     MI 2007, CABG  . Osteoporosis     per DEXA 05-2008  . Peripheral polyneuropathy     Severe, causing problems with his feet, see surgeries  . Diabetes mellitus     Dx 07-2008 A1C 6.2  . Onychomycosis     toenails  . Hiatal hernia   . Esophageal dysmotility   . Gastroparesis   . Fatty liver 10/11/09  . Pulmonary fibrosis   . Neuropathy     (related to RA per neuro note 05-2011)  . Oxygen dependent     3l  . CHF (congestive heart failure)     pt stated he doesnt have this  . Complication of anesthesia     DIFFICULTY AFTER CABG HAD CONFUSION POST OP  . Myocardial infarction 2007  . Shortness of breath    Past Surgical History  Procedure Laterality Date  . Foot surgery      to tendon release and repair of osteomyelitis   . Cholecystectomy    . Coronary artery bypass graft  2007    (LIMA to the LAD, left radial to obtuse marginal, SVG to first diagonal, SVG to PDA. His  ejection fraction to 50-55%  . Achiles tendon surgery  8/09  . Toe amputation  4-12/ 3 14    d/t a deformity, found to have osteomyelitis, complicated by post-op foot strtess FX  . Esophagogastroduodenoscopy N/A 10/19/2012    Procedure: ESOPHAGOGASTRODUODENOSCOPY (EGD);  Surgeon: Hart Carwin, MD;  Location: [facility] ENDOSCOPY;  Service: Endoscopy;  Laterality: N/A;  the dilatation is possible.   . Laryngoscopy  01/21/2014     DR BATES   . Laryngoscopy N/A 01/21/2014    Procedure: LARYNGOSCOPY;  Surgeon: Christia Reading, MD;  Location: [facility] OR;  Service: ENT;  Laterality: N/A;  micro direct laryngoscopy with prolaryn injection/jet venturi ventilation   Family History  Problem Relation Age of Onset  . Heart disease Mother   . Diverticulitis Mother   . Heart disease Father   . Heart attack Father     x2  . Colon cancer Neg Hx   . Prostate cancer Neg Hx    History  Substance Use Topics  . Smoking status: Former Smoker -- 1.00 packs/day for 40 years    Quit date: 08/26/2005  . Smokeless tobacco: Former Neurosurgeon    Quit date: 04/02/2006  Comment: smoked x 40 years, 1 ppd  . Alcohol Use: No    Review of Systems  Constitutional: Negative for fever, chills, diaphoresis, appetite change and fatigue.  HENT: Negative for mouth sores, sore throat and trouble swallowing.   Eyes: Negative for visual disturbance.  Respiratory: Negative for cough, chest tightness, shortness of breath and wheezing.   Cardiovascular: Negative for chest pain.  Gastrointestinal: Negative for nausea, vomiting, abdominal pain, diarrhea and abdominal distention.  Endocrine: Negative for polydipsia, polyphagia and polyuria.  Genitourinary: Negative for dysuria, frequency and hematuria.  Musculoskeletal: Positive for arthralgias, gait problem and myalgias.  Skin: Negative for color change, pallor and rash.  Neurological: Negative for dizziness, syncope, light-headedness and headaches.  Hematological: Does not bruise/bleed  easily.  Psychiatric/Behavioral: Negative for behavioral problems and confusion.      Allergies  Review of patient's allergies indicates no known allergies.  Home Medications   Prior to Admission medications   Medication Sig Start Date End Date Taking? Authorizing Provider  ADVAIR DISKUS 100-50 MCG/DOSE AEPB INHALE ONE(1) PUFF BY MOUTH TWICE DAILY THEN RINSE MOUTH    [name], MD  albuterol (PROVENTIL,VENTOLIN) 90 MCG/ACT inhaler Inhale 2 puffs into the lungs every 4 (four) hours as needed for wheezing or shortness of breath. 04/09/11   Waymon Budge, MD  amitriptyline (ELAVIL) 75 MG tablet Take 75 mg by mouth at bedtime.     Historical Provider, MD  Ascorbic Acid (VITAMIN C) 500 MG tablet Take 500 mg by mouth daily.      Historical Provider, MD  aspirin 81 MG tablet Take 81 mg by mouth daily.      Historical Provider, MD  atorvastatin (LIPITOR) 80 MG tablet Take 1 tablet (80 mg total) by mouth daily at 6 PM. 12/29/13   Rosalio Macadamia, NP  Carbonyl Iron (PERFECT IRON PO) Take 1 tablet by mouth daily.     Historical Provider, MD  folic acid (FOLVITE) 800 MCG tablet Take 800 mcg by mouth daily.    Historical Provider, MD  furosemide (LASIX) 20 MG tablet Take 20-40 mg by mouth 2 (two) times daily. 40 mg in the morning and 20 mg in the evening    Historical Provider, MD  glucose blood (ONE TOUCH ULTRA TEST) test strip Test blood sugar once daily. Dx Code: 250.00 01/25/14   Wanda Plump, MD  Lancets [facility] ULTRASOFT) lancets USE AS DIRECTED    Wanda Plump, MD  methotrexate (RHEUMATREX) 2.5 MG tablet Take 20 mg by mouth once a week. 8 tablets every Thursday 01/27/13   Historical Provider, MD  metoprolol tartrate (LOPRESSOR) 25 MG tablet Take 1 tablet (25 mg total) by mouth 2 (two) times daily. 06/01/13   Rollene Rotunda, MD  Misc. Devices (ACAPELLA) MISC Use as directed 12/20/13   [name], MD  nitroGLYCERIN (NITROSTAT) 0.4 MG SL tablet Place 1 tablet (0.4 mg total) under the tongue every 5  (five) minutes as needed for chest pain. 11/15/13   Rollene Rotunda, MD  NON FORMULARY EVERGO PORTABLE PULSE DOSE OXYGEN oxygen 3L per nasal cannula 24 hours a day Dx: 496    Historical Provider, MD  oxyCODONE-acetaminophen (PERCOCET) 7.5-325 MG per tablet Take 1 tablet by mouth. 3 times a day    Historical Provider, MD  pantoprazole (PROTONIX) 40 MG tablet Take 40 mg by mouth 2 (two) times daily.    Historical Provider, MD  potassium chloride SA (K-DUR,KLOR-CON) 20 MEQ tablet Take 20-40 mEq by mouth 2 (two) times daily. 40  meq in the morning and 20 meq at 4 pm 06/17/13   Rollene Rotunda, MD  predniSONE (DELTASONE) 5 MG tablet Take 5 mg by mouth daily.    Historical Provider, MD  pregabalin (LYRICA) 150 MG capsule Take 150 mg by mouth 2 (two) times daily.     Historical Provider, MD  Respiratory Therapy Supplies (FLUTTER) DEVI Blow through several times, 2 or 3 sets per day as needed 03/30/13   Waymon Budge, MD  Respiratory Therapy Supplies (FLUTTER) DEVI Use as directed 08/02/13   [name], MD  tiotropium (SPIRIVA HANDIHALER) 18 MCG inhalation capsule Place 1 capsule (18 mcg total) into inhaler and inhale daily. 1 puff in AM 04/15/13 05/09/14  [name], MD  UNABLE TO FIND Med Name: Incentive Spirometer Use as directed 08/01/11   Waymon Budge, MD  vitamin B-12 (CYANOCOBALAMIN) 250 MCG tablet Take 250 mcg by mouth daily.      Historical Provider, MD   BP 137/89  Pulse 88  Temp(Src) 98.4 F (36.9 C) (Oral)  Resp 22  Ht 6' (1.829 m)  Wt 240 lb (108.863 kg)  BMI 32.54 kg/m2  SpO2 95% Physical Exam  Constitutional: He is oriented to person, place, and time. He appears well-developed and well-nourished. No distress.  HENT:  Head: Normocephalic.  Eyes: Conjunctivae are normal. Pupils are equal, round, and reactive to light. No scleral icterus.  Neck: Normal range of motion. Neck supple. No thyromegaly present.  Cardiovascular: Normal rate and regular rhythm.  Exam reveals no gallop and no  friction rub.   No murmur heard. Pulmonary/Chest: Effort normal and breath sounds normal. No respiratory distress. He has no wheezes. He has no rales.  Abdominal: Soft. Bowel sounds are normal. He exhibits no distension. There is no tenderness. There is no rebound.  Musculoskeletal: Normal range of motion.  Wearing a lidocaine patch on the right wrist. The obvious effusion. No erythema or warmth. Knees show no effusion or warmth. Full range of motion although painful. No abnormalities of the feet or ankles noted on exam. No redness erythema warmth or effusions. No swelling or clinical signs of DVT.  Neurological: He is alert and oriented to person, place, and time.  Skin: Skin is warm and dry. No rash noted.  Psychiatric: He has a normal mood and affect. His behavior is normal.    ED Course  Procedures (including critical care time) Labs Review Labs Reviewed - No data to display  Imaging Review No results found.   EKG Interpretation None      MDM   Final diagnoses:  Rheumatoid arthritis    Pt with sx and history suggestive of RA flare.  No sign of infection, effusions.  Pt has Rheumatologist ing [place]. Plan for today is Decadron, pain control. Medrol taper.    Rolland Porter, MD 03/23/14 [phone]

## 2014-03-23 NOTE — ED Notes (Signed)
Pt states "I usually get a steriod injection that helps my pain, I was seen at an urgent care last night in [place], [place], and they wouldn't give it to me there..." pt states he drove from [facility] to [place] last night to visit family.

## 2014-03-23 NOTE — ED Notes (Signed)
Knee pain, feet, right wrist/arm. Patient has RA and is having a "flair up"

## 2014-03-24 ENCOUNTER — Ambulatory Visit: Payer: BC Managed Care – PPO | Admitting: Medical

## 2014-03-31 ENCOUNTER — Other Ambulatory Visit: Payer: Self-pay | Admitting: Pulmonary Disease

## 2014-04-01 ENCOUNTER — Ambulatory Visit (INDEPENDENT_AMBULATORY_CARE_PROVIDER_SITE_OTHER): Payer: BC Managed Care – PPO | Admitting: Internal Medicine

## 2014-04-01 ENCOUNTER — Encounter: Payer: Self-pay | Admitting: Internal Medicine

## 2014-04-01 VITALS — BP 113/77 | HR 89 | Temp 98.4°F

## 2014-04-01 DIAGNOSIS — M069 Rheumatoid arthritis, unspecified: Secondary | ICD-10-CM

## 2014-04-01 MED ORDER — PREDNISONE 10 MG PO TABS
20.0000 mg | ORAL_TABLET | Freq: Every day | ORAL | Status: DC
Start: 2014-04-01 — End: 2014-04-15

## 2014-04-01 NOTE — Patient Instructions (Signed)
Prednisone 10 mg 2 tablets a day until you see the rheumatologists Hold the  prednisone 5 mg Call anytime if you have fever, chills, or if the knee gets red, more swollen or painful

## 2014-04-01 NOTE — Progress Notes (Signed)
Subjective:    Patient ID: Joseph [name], male    DOB: [birth year], 63 y.o.   MRN: [id number]  DOS:  04/01/2014 Type of visit - description: ER followup History: Developed stiffness, pain and swelling at both knees, wrists and ankles while he was in [place]. Went to the ER 03/23/2014, got a decadron  shot and a Medrol pack. Symptoms decreased but now that he is finishing the pack , noted   the pain and swelling @ the left knee and right wrist are resurfacing. Stiffness went away, so far not coming back    ROS Denies fever chills No headache or dizziness No recent fall or injury CBGs while taking high doses of steroids remain 115, 112  Past Medical History  Diagnosis Date  . Rheumatoid arthritis(714.0)   . Psoriasis   . Hypertension   . Gastroesophageal reflux disease   . COPD (chronic obstructive pulmonary disease)   . CAD (coronary artery disease)     MI 2007, CABG  . Osteoporosis     per DEXA 05-2008  . Peripheral polyneuropathy     Severe, causing problems with his feet, see surgeries  . Diabetes mellitus     Dx 07-2008 A1C 6.2  . Onychomycosis     toenails  . Hiatal hernia   . Esophageal dysmotility   . Gastroparesis   . Fatty liver 10/11/09  . Pulmonary fibrosis   . Neuropathy     (related to RA per neuro note 05-2011)  . Oxygen dependent     3l  . CHF (congestive heart failure)     pt stated he doesnt have this  . Complication of anesthesia     DIFFICULTY AFTER CABG HAD CONFUSION POST OP  . Myocardial infarction 2007  . Shortness of breath     Past Surgical History  Procedure Laterality Date  . Foot surgery      to tendon release and repair of osteomyelitis   . Cholecystectomy    . Coronary artery bypass graft  2007    (LIMA to the LAD, left radial to obtuse marginal, SVG to first diagonal, SVG to PDA. His ejection fraction to 50-55%  . Achiles tendon surgery  8/09  . Toe amputation  4-12/ 3 14    d/t a deformity, found to have osteomyelitis,  complicated by post-op foot strtess FX  . Esophagogastroduodenoscopy N/A 10/19/2012    Procedure: ESOPHAGOGASTRODUODENOSCOPY (EGD);  Surgeon: Hart Carwin, MD;  Location: [facility] ENDOSCOPY;  Service: Endoscopy;  Laterality: N/A;  the dilatation is possible.   . Laryngoscopy  01/21/2014     DR BATES   . Laryngoscopy N/A 01/21/2014    Procedure: LARYNGOSCOPY;  Surgeon: Christia Reading, MD;  Location: [facility] OR;  Service: ENT;  Laterality: N/A;  micro direct laryngoscopy with prolaryn injection/jet venturi ventilation    History   Social History  . Marital Status: Married    Spouse Name: N/A    Number of Children: 0  . Years of Education: N/A   Occupational History  . disabled-retired    Social History Main Topics  . Smoking status: Former Smoker -- 1.00 packs/day for 40 years    Quit date: 08/26/2005  . Smokeless tobacco: Former Neurosurgeon    Quit date: 04/02/2006     Comment: smoked x 40 years, 1 ppd  . Alcohol Use: No  . Drug Use: No  . Sexual Activity: Not on file   Other Topics Concern  . Not on file   Social  History Narrative  . No narrative on file        Medication List       This list is accurate as of: 04/01/14 11:59 PM.  Always use your most recent med list.               ACAPELLA Misc  Use as directed     ADVAIR DISKUS 100-50 MCG/DOSE Aepb  Generic drug:  Fluticasone-Salmeterol  INHALE ONE(1) PUFF BY MOUTH TWICE DAILY THEN RINSE MOUTH     albuterol 90 MCG/ACT inhaler  Commonly known as:  PROVENTIL,VENTOLIN  Inhale 2 puffs into the lungs every 4 (four) hours as needed for wheezing or shortness of breath.     amitriptyline 75 MG tablet  Commonly known as:  ELAVIL  Take 75 mg by mouth at bedtime.     aspirin 81 MG tablet  Take 81 mg by mouth daily.     atorvastatin 80 MG tablet  Commonly known as:  LIPITOR  Take 1 tablet (80 mg total) by mouth daily at 6 PM.     FLUTTER Devi  Blow through several times, 2 or 3 sets per day as needed     FLUTTER Devi  Use as  directed     folic acid 800 MCG tablet  Commonly known as:  FOLVITE  Take 800 mcg by mouth daily.     furosemide 20 MG tablet  Commonly known as:  LASIX  Take 20-40 mg by mouth 2 (two) times daily. 40 mg in the morning and 20 mg in the evening     glucose blood test strip  Commonly known as:  ONE TOUCH ULTRA TEST  Test blood sugar once daily. Dx Code: 250.00     methotrexate 2.5 MG tablet  Commonly known as:  RHEUMATREX  Take 20 mg by mouth once a week. 8 tablets every Thursday     metoprolol tartrate 25 MG tablet  Commonly known as:  LOPRESSOR  Take 1 tablet (25 mg total) by mouth 2 (two) times daily.     nitroGLYCERIN 0.4 MG SL tablet  Commonly known as:  NITROSTAT  Place 1 tablet (0.4 mg total) under the tongue every 5 (five) minutes as needed for chest pain.     NON FORMULARY  - EVERGO PORTABLE PULSE DOSE OXYGEN  - oxygen 3L per nasal cannula 24 hours a day  - Dx: 496     onetouch ultrasoft lancets  USE AS DIRECTED     Oxycodone HCl 10 MG Tabs  Commonly known as:  ROXICODONE  Take 0.5 tablets (5 mg total) by mouth every 4 (four) hours as needed for moderate pain or severe pain.     oxyCODONE-acetaminophen 7.5-325 MG per tablet  Commonly known as:  PERCOCET  Take 1 tablet by mouth. 3 times a day     pantoprazole 40 MG tablet  Commonly known as:  PROTONIX  Take 40 mg by mouth 2 (two) times daily.     PERFECT IRON PO  Take 1 tablet by mouth daily.     potassium chloride SA 20 MEQ tablet  Commonly known as:  K-DUR,KLOR-CON  Take 20-40 mEq by mouth 2 (two) times daily. 40 meq in the morning and 20 meq at 4 pm     predniSONE 10 MG tablet  Commonly known as:  DELTASONE  Take 2 tablets (20 mg total) by mouth daily with breakfast.     predniSONE 5 MG tablet  Commonly known as:  DELTASONE  Take 5 mg by mouth daily.     pregabalin 150 MG capsule  Commonly known as:  LYRICA  Take 150 mg by mouth 2 (two) times daily.     SPIRIVA HANDIHALER 18 MCG inhalation  capsule  Generic drug:  tiotropium  INHALE THE CONTENTS OF 1 CAPSULE VIA HANDIHALER ONCE DAILY IN THE MORNING     UNABLE TO FIND  - Med Name: Incentive Spirometer  - Use as directed     vitamin B-12 250 MCG tablet  Commonly known as:  CYANOCOBALAMIN  Take 250 mcg by mouth daily.     vitamin C 500 MG tablet  Commonly known as:  ASCORBIC ACID  Take 500 mg by mouth daily.           Objective:   Physical Exam BP 113/77  Pulse 89  Temp(Src) 98.4 F (36.9 C) (Oral)  SpO2 95%  General -- alert, well-developed, NAD.   Extremities-- no pretibial edema bilaterally ; Left knee slightly swollen and warmer compared to the right one, no redness, range of motion is slightly decreased. Similarly, R wrist slightly swollen compared to the left Neurologic--  alert & oriented X3.   Psych-- Cognition and judgment appear intact. Cooperative with normal attention span and concentration. No anxious or depressed appearing.         Assessment & Plan:

## 2014-04-01 NOTE — Assessment & Plan Note (Signed)
See history of present illness, likely having an exacerbation of RA. I thought about a septic left knee but pain and swelling happening in the context of other joints exacerbation. Plan: Prednisone 20 mg daily untilt he sees his rheumatologist in few days

## 2014-04-04 ENCOUNTER — Other Ambulatory Visit: Payer: Self-pay | Admitting: Pulmonary Disease

## 2014-04-05 ENCOUNTER — Ambulatory Visit (INDEPENDENT_AMBULATORY_CARE_PROVIDER_SITE_OTHER)
Admission: RE | Admit: 2014-04-05 | Discharge: 2014-04-05 | Disposition: A | Discharge status: Home / Self Care | Payer: BC Managed Care – PPO | Source: Ambulatory Visit | Attending: Pulmonary Disease | Admitting: Pulmonary Disease

## 2014-04-05 DIAGNOSIS — R911 Solitary pulmonary nodule: Secondary | ICD-10-CM

## 2014-04-06 ENCOUNTER — Telehealth: Payer: Self-pay | Admitting: Pulmonary Disease

## 2014-04-06 NOTE — Telephone Encounter (Signed)
CT chest 04/05/14 >> atherosclerosis, moderate diffuse centrilobular and paraseptal emphysema, stable pulmonary nodules with dominant LUL nodule now 7 mm, steatosis of liver with ?early cirrhosis  Will have my nurse inform pt that CT chest did not show any progression in lung nodules.  He will need ROV to discuss results and plan in more detail.

## 2014-04-07 ENCOUNTER — Ambulatory Visit: Payer: BC Managed Care – PPO | Admitting: Internal Medicine

## 2014-04-07 NOTE — Telephone Encounter (Signed)
Results have been explained to patient, pt expressed understanding.  Appt scheduled to discuss results/plan 04/15/14 at 2pm Nothing further needed.

## 2014-04-15 ENCOUNTER — Ambulatory Visit (INDEPENDENT_AMBULATORY_CARE_PROVIDER_SITE_OTHER): Payer: BC Managed Care – PPO | Admitting: Pulmonary Disease

## 2014-04-15 ENCOUNTER — Encounter: Payer: Self-pay | Admitting: Pulmonary Disease

## 2014-04-15 VITALS — BP 104/60 | HR 69 | Ht 72.0 in | Wt 242.4 lb

## 2014-04-15 DIAGNOSIS — J432 Centrilobular emphysema: Secondary | ICD-10-CM

## 2014-04-15 DIAGNOSIS — K7689 Other specified diseases of liver: Secondary | ICD-10-CM

## 2014-04-15 DIAGNOSIS — R918 Other nonspecific abnormal finding of lung field: Secondary | ICD-10-CM

## 2014-04-15 DIAGNOSIS — J9610 Chronic respiratory failure, unspecified whether with hypoxia or hypercapnia: Secondary | ICD-10-CM

## 2014-04-15 DIAGNOSIS — R0902 Hypoxemia: Secondary | ICD-10-CM

## 2014-04-15 DIAGNOSIS — J9611 Chronic respiratory failure with hypoxia: Secondary | ICD-10-CM

## 2014-04-15 DIAGNOSIS — J438 Other emphysema: Secondary | ICD-10-CM

## 2014-04-15 DIAGNOSIS — J189 Pneumonia, unspecified organism: Secondary | ICD-10-CM

## 2014-04-15 DIAGNOSIS — R911 Solitary pulmonary nodule: Secondary | ICD-10-CM

## 2014-04-15 DIAGNOSIS — J479 Bronchiectasis, uncomplicated: Secondary | ICD-10-CM

## 2014-04-15 DIAGNOSIS — K760 Fatty (change of) liver, not elsewhere classified: Secondary | ICD-10-CM

## 2014-04-15 DIAGNOSIS — M069 Rheumatoid arthritis, unspecified: Secondary | ICD-10-CM

## 2014-04-15 MED ORDER — TIOTROPIUM BROMIDE MONOHYDRATE 18 MCG IN CAPS: 18.0000 ug | ORAL_CAPSULE | Freq: Every day | RESPIRATORY_TRACT | Status: DC

## 2014-04-15 MED ORDER — FLUTICASONE-SALMETEROL 100-50 MCG/DOSE IN AEPB: 1.0000 | INHALATION_SPRAY | Freq: Two times a day (BID) | RESPIRATORY_TRACT | Status: DC

## 2014-04-15 NOTE — Patient Instructions (Signed)
Will schedule CT chest for August 2016 Follow up in 6 months

## 2014-04-15 NOTE — Progress Notes (Signed)
Chief Complaint  Patient presents with  . Follow-up    Discuss CT results.    CC: Joseph [name], Joseph [name]  History of Present Illness: Joseph [name] is a 63 y.o. male former smoker with COPD/emphysema, rheumatoid arthritis, recurrent pneumonia 2nd to chronic aspiration/reflux, bronchiectasis, chronic respiratory failure on 3 liters oxygen 24/7, and pulmonary nodules.  He is here for follow up of CT chest from earlier this month.  This showed stable findings for pulmonary nodules.  He was also noted to have hepatic steatosis with some concern for early findings of cirrhosis.  His breathing symptoms have been doing better.  He does not have much cough, wheeze, or sputum.  He denies chest pain. He notices more trouble with his breathing when he rushes his activities.  He also will sometimes not put his oxygen on when doing activities and he notices more breathing trouble then also.  He recently had his prednisone dose increased to control joint symptoms.  TESTS: PFT 01/12/09 >> FEV1 3.02 (85%), FEV1% 68, TLC 6.35 (88%), DLCO 45%, no BD CT chest 10/17/12 >> severe centrilobular emphysema, apical bullae, 1 cm nodule LUL new, 5 mm nodule RLL stable since 2009, BTX with GGO at bases PFT 12/02/12 >> FEV1 3.11 (78%), FEV1% 77, TLC 6.32 (83%), DLCO 37%, no BD CT chest 05/06/13 >> decreased size LUL nodule now 8 mm, 4 mm nodule RLL, 3 mm nodule RML, 7 mm nodule RML, 8 mm nodule lingula CT chest 04/05/14 >> atherosclerosis, moderate diffuse centrilobular and paraseptal emphysema, stable pulmonary nodules with dominant LUL nodule now 7 mm, steatosis of liver with ?early cirrhosis  PMHx, PSHx, Medications, Allergies, Fhx, Shx reviewed.   Physical Exam:  General - No distress, wearing oxygen ENT - No sinus tenderness, no oral exudate, no LAN Cardiac - s1s2 regular, no murmur Chest - decreased breath sounds, no wheeze/rales Back - No focal tenderness Abd - Soft, non-tender Ext - deformities  of rheumatoid arthritis Neuro - Normal strength Skin - No rashes Psych - normal mood, and behavior   Assessment/Plan:  Joseph Helling, MD [place] [facility]Critical [facility]/Sleep Pager:  [phone]

## 2014-04-17 DIAGNOSIS — K760 Fatty (change of) liver, not elsewhere classified: Secondary | ICD-10-CM | POA: Insufficient documentation

## 2014-04-17 NOTE — Assessment & Plan Note (Signed)
Continue 3 liters oxygen 24/7.

## 2014-04-17 NOTE — Assessment & Plan Note (Signed)
Related to recurrent respiratory infections in setting of chronic aspiration and rheumatoid arthritis.  Continue current pulmonary hygiene regimen.

## 2014-04-17 NOTE — Assessment & Plan Note (Signed)
Stable on current regimen.  He is to continue spiriva, advair, and prn albuterol.

## 2014-04-17 NOTE — Assessment & Plan Note (Addendum)
He is followed by Dr. Juanda Chance for his reflux.

## 2014-04-17 NOTE — Assessment & Plan Note (Signed)
Stable on CT chest from earlier this month.  He will need f/u CT chest w/o contrast for August 2016.

## 2014-04-17 NOTE — Assessment & Plan Note (Signed)
He is on chronic prednisone and MTX per rheumatology at [facility].     

## 2014-04-17 NOTE — Assessment & Plan Note (Signed)
He was noted to have hepatic steatosis on CT chest and there was concern for early stages of cirrhosis.  Advised him to d/w PCP, GI, and rheumatology >> main concern is whether he needs to have adjustment to his MTX regimen.

## 2014-05-11 ENCOUNTER — Ambulatory Visit (INDEPENDENT_AMBULATORY_CARE_PROVIDER_SITE_OTHER): Payer: BC Managed Care – PPO | Admitting: Internal Medicine

## 2014-05-11 ENCOUNTER — Encounter: Payer: Self-pay | Admitting: Internal Medicine

## 2014-05-11 VITALS — BP 120/64 | HR 78 | Temp 97.8°F | Ht 72.0 in | Wt 249.0 lb

## 2014-05-11 DIAGNOSIS — Z23 Encounter for immunization: Secondary | ICD-10-CM

## 2014-05-11 DIAGNOSIS — Z0000 Encounter for general adult medical examination without abnormal findings: Secondary | ICD-10-CM

## 2014-05-11 NOTE — Progress Notes (Signed)
Pre visit review using our clinic review tool, if applicable. No additional management support is needed unless otherwise documented below in the visit note. 

## 2014-05-11 NOTE — Assessment & Plan Note (Addendum)
TD 2013 PNM 23 ---2007, 2012 prevnar today Flu shot today No zostavax d/t immunosupression  Never had a colonoscopy, see previous entry, plans to discuss that with   his GI doctor in the near future. Encouraged to send back iFOB that was as provided few months ago.   Prostate cancer screening:  PSA 04-2012 was  0.3 , had a DRE 06-2013--->  Re asses on next CPX Labs

## 2014-05-11 NOTE — Patient Instructions (Signed)
Stop by the front desk and schedule labs to be done within few days (fasting) FLP  And A1c ---- dx diabetes  Please come back to the office in 6 months for a routine office check up     Fall Prevention and Home Safety Falls cause injuries and can affect all age groups. It is possible to use preventive measures to significantly decrease the likelihood of falls. There are many simple measures which can make your home safer and prevent falls. OUTDOORS  Repair cracks and edges of walkways and driveways.  Remove high doorway thresholds.  Trim shrubbery on the main path into your home.  Have good outside lighting.  Clear walkways of tools, rocks, debris, and clutter.  Check that handrails are not broken and are securely fastened. Both sides of steps should have handrails.  Have leaves, snow, and ice cleared regularly.  Use sand or salt on walkways during winter months.  In the garage, clean up grease or oil spills. BATHROOM  Install night lights.  Install grab bars by the toilet and in the tub and shower.  Use non-skid mats or decals in the tub or shower.  Place a plastic non-slip stool in the shower to sit on, if needed.  Keep floors dry and clean up all water on the floor immediately.  Remove soap buildup in the tub or shower on a regular basis.  Secure bath mats with non-slip, double-sided rug tape.  Remove throw rugs and tripping hazards from the floors. BEDROOMS  Install night lights.  Make sure a bedside light is easy to reach.  Do not use oversized bedding.  Keep a telephone by your bedside.  Have a firm chair with side arms to use for getting dressed.  Remove throw rugs and tripping hazards from the floor. KITCHEN  Keep handles on pots and pans turned toward the center of the stove. Use back burners when possible.  Clean up spills quickly and allow time for drying.  Avoid walking on wet floors.  Avoid hot utensils and knives.  Position shelves so  they are not too high or low.  Place commonly used objects within easy reach.  If necessary, use a sturdy step stool with a grab bar when reaching.  Keep electrical cables out of the way.  Do not use floor polish or wax that makes floors slippery. If you must use wax, use non-skid floor wax.  Remove throw rugs and tripping hazards from the floor. STAIRWAYS  Never leave objects on stairs.  Place handrails on both sides of stairways and use them. Fix any loose handrails. Make sure handrails on both sides of the stairways are as long as the stairs.  Check carpeting to make sure it is firmly attached along stairs. Make repairs to worn or loose carpet promptly.  Avoid placing throw rugs at the top or bottom of stairways, or properly secure the rug with carpet tape to prevent slippage. Get rid of throw rugs, if possible.  Have an electrician put in a light switch at the top and bottom of the stairs. OTHER FALL PREVENTION TIPS  Wear low-heel or rubber-soled shoes that are supportive and fit well. Wear closed toe shoes.  When using a stepladder, make sure it is fully opened and both spreaders are firmly locked. Do not climb a closed stepladder.  Add color or contrast paint or tape to grab bars and handrails in your home. Place contrasting color strips on first and last steps.  Learn and use mobility  aids as needed. Install an electrical emergency response system.  Turn on lights to avoid dark areas. Replace light bulbs that burn out immediately. Get light switches that glow.  Arrange furniture to create clear pathways. Keep furniture in the same place.  Firmly attach carpet with non-skid or double-sided tape.  Eliminate uneven floor surfaces.  Select a carpet pattern that does not visually hide the edge of steps.  Be aware of all pets. OTHER HOME SAFETY TIPS  Set the water temperature for 120 F (48.8 C).  Keep emergency numbers on or near the telephone.  Keep smoke  detectors on every level of the home and near sleeping areas. Document Released: 08/02/2002 Document Revised: 02/11/2012 Document Reviewed: 11/01/2011 [facility] Patient Information 2015 [place], [place]. This information is not intended to replace advice given to you by your health care provider. Make sure you discuss any questions you have with your health care provider.

## 2014-05-11 NOTE — Progress Notes (Signed)
Subjective:    Patient ID: Joseph [name], male    DOB: 11-19-1950, 63 y.o.   MRN: [id number]  DOS:  05/11/2014 Type of visit - description : CPX Interval history:  he was seen by rheumatology, RA flare up is  better   ROS Denies chest pain, minimal edema, dyspnea on exertion at baseline No nausea, vomiting, diarrhea or blood in the stools. Cough and sputum production well control as long as he takes his medication and does his respiratory exercises. No GERD symptoms or dysphagia  Past Medical History  Diagnosis Date  . Rheumatoid arthritis(714.0)   . Psoriasis   . Hypertension   . Gastroesophageal reflux disease   . COPD (chronic obstructive pulmonary disease)   . CAD (coronary artery disease)     MI 2007, CABG  . Osteoporosis     per DEXA 05-2008  . Peripheral polyneuropathy     Severe, causing problems with his feet, see surgeries  . Diabetes mellitus     Dx 07-2008 A1C 6.2  . Onychomycosis     toenails  . Hiatal hernia   . Esophageal dysmotility   . Gastroparesis   . Fatty liver 10/11/09  . Pulmonary fibrosis   . Neuropathy     (related to RA per neuro note 05-2011)  . Oxygen dependent     3l  . CHF (congestive heart failure)     pt stated he doesnt have this  . Complication of anesthesia     DIFFICULTY AFTER CABG HAD CONFUSION POST OP  . Myocardial infarction 2007  . Shortness of breath   . Cataracts, bilateral     Past Surgical History  Procedure Laterality Date  . Foot surgery      to tendon release and repair of osteomyelitis   . Cholecystectomy    . Coronary artery bypass graft  2007    (LIMA to the LAD, left radial to obtuse marginal, SVG to first diagonal, SVG to PDA. His ejection fraction to 50-55%  . Achiles tendon surgery  8/09  . Toe amputation  4-12/ 3 14    d/t a deformity, found to have osteomyelitis, complicated by post-op foot strtess FX  . Esophagogastroduodenoscopy N/A 10/19/2012    Procedure: ESOPHAGOGASTRODUODENOSCOPY (EGD);   Surgeon: Hart Carwin, MD;  Location: [facility] ENDOSCOPY;  Service: Endoscopy;  Laterality: N/A;  the dilatation is possible.   . Laryngoscopy  01/21/2014     DR BATES   . Laryngoscopy N/A 01/21/2014    Procedure: LARYNGOSCOPY;  Surgeon: Christia Reading, MD;  Location: [facility] OR;  Service: ENT;  Laterality: N/A;  micro direct laryngoscopy with prolaryn injection/jet venturi ventilation    History   Social History  . Marital Status: Married    Spouse Name: N/A    Number of Children: 0  . Years of Education: N/A   Occupational History  . disabled-retired    Social History Main Topics  . Smoking status: Former Smoker -- 1.00 packs/day for 40 years    Quit date: 08/26/2005  . Smokeless tobacco: Former Neurosurgeon    Quit date: 04/02/2006     Comment: smoked x 40 years, 1 ppd  . Alcohol Use: No  . Drug Use: No  . Sexual Activity: Not on file   Other Topics Concern  . Not on file   Social History Narrative  . No narrative on file     Family History  Problem Relation Age of Onset  . Heart disease Mother   .  Diverticulitis Mother   . Heart disease Father   . Heart attack Father     x2  . Colon cancer Neg Hx   . Prostate cancer Neg Hx        Medication List       This list is accurate as of: 05/11/14  6:36 PM.  Always use your most recent med list.               ACAPELLA Misc  Use as directed     albuterol 90 MCG/ACT inhaler  Commonly known as:  PROVENTIL,VENTOLIN  Inhale 2 puffs into the lungs every 4 (four) hours as needed for wheezing or shortness of breath.     amitriptyline 75 MG tablet  Commonly known as:  ELAVIL  Take 75 mg by mouth at bedtime.     aspirin 81 MG tablet  Take 81 mg by mouth daily.     atorvastatin 80 MG tablet  Commonly known as:  LIPITOR  Take 1 tablet (80 mg total) by mouth daily at 6 PM.     Fluticasone-Salmeterol 100-50 MCG/DOSE Aepb  Commonly known as:  ADVAIR DISKUS  Inhale 1 puff into the lungs 2 (two) times daily.     FLUTTER Devi  Blow  through several times, 2 or 3 sets per day as needed     folic acid 800 MCG tablet  Commonly known as:  FOLVITE  Take 800 mcg by mouth daily.     furosemide 20 MG tablet  Commonly known as:  LASIX  Take 20-40 mg by mouth 2 (two) times daily. 40 mg in the morning and 20 mg in the evening     glucose blood test strip  Commonly known as:  ONE TOUCH ULTRA TEST  Test blood sugar once daily. Dx Code: 250.00     methotrexate 2.5 MG tablet  Commonly known as:  RHEUMATREX  Take 20 mg by mouth once a week. 8 tablets every Thursday     metoprolol tartrate 25 MG tablet  Commonly known as:  LOPRESSOR  Take 1 tablet (25 mg total) by mouth 2 (two) times daily.     nitroGLYCERIN 0.4 MG SL tablet  Commonly known as:  NITROSTAT  Place 1 tablet (0.4 mg total) under the tongue every 5 (five) minutes as needed for chest pain.     NON FORMULARY  - EVERGO PORTABLE PULSE DOSE OXYGEN  - oxygen 3L per nasal cannula 24 hours a day  - Dx: 496     onetouch ultrasoft lancets  USE AS DIRECTED     oxyCODONE-acetaminophen 7.5-325 MG per tablet  Commonly known as:  PERCOCET  Take 1 tablet by mouth. 3 times a day     pantoprazole 40 MG tablet  Commonly known as:  PROTONIX  Take 40 mg by mouth 2 (two) times daily.     PERFECT IRON PO  Take 1 tablet by mouth daily.     potassium chloride SA 20 MEQ tablet  Commonly known as:  K-DUR,KLOR-CON  Take 20-40 mEq by mouth 2 (two) times daily. 40 meq in the morning and 20 meq at 4 pm     predniSONE 5 MG tablet  Commonly known as:  DELTASONE  Take 5 mg by mouth daily.     pregabalin 150 MG capsule  Commonly known as:  LYRICA  Take 150 mg by mouth 2 (two) times daily.     tiotropium 18 MCG inhalation capsule  Commonly known as:  SPIRIVA HANDIHALER  Place 1 capsule (18 mcg total) into inhaler and inhale daily.     UNABLE TO FIND  - Med Name: Incentive Spirometer  - Use as directed     vitamin B-12 250 MCG tablet  Commonly known as:   CYANOCOBALAMIN  Take 250 mcg by mouth daily.     vitamin C 500 MG tablet  Commonly known as:  ASCORBIC ACID  Take 500 mg by mouth daily.           Objective:   Physical Exam BP 120/64  Pulse 78  Temp(Src) 97.8 F (36.6 C) (Oral)  Ht 6' (1.829 m)  Wt 249 lb (112.946 kg)  BMI 33.76 kg/m2  SpO2 98% General -- alert, well-developed, NAD.  Neck --no thyromegaly  HEENT-- Not pale.  Lungs -- normal respiratory effort, no intercostal retractions, no accessory muscle use, and normal breath sounds.  Heart-- normal rate, regular rhythm, no murmur.  Abdomen-- Not distended, good bowel sounds,soft, non-tender. Extremities-- trace pretibial edema bilaterally  Neurologic--  alert & oriented X3. Speech normal, gait quite limited Psych-- Cognition and judgment appear intact. Cooperative with normal attention span and concentration. No anxious or depressed appearing.     Assessment & Plan:   Chronic medical problems: Will come back for a FLP and A1c Continue same medications otherwise

## 2014-05-17 ENCOUNTER — Other Ambulatory Visit (INDEPENDENT_AMBULATORY_CARE_PROVIDER_SITE_OTHER): Payer: BC Managed Care – PPO

## 2014-05-17 DIAGNOSIS — E785 Hyperlipidemia, unspecified: Secondary | ICD-10-CM

## 2014-05-17 LAB — HEPATIC FUNCTION PANEL
ALBUMIN: 3.9 g/dL (ref 3.5–5.2)
ALK PHOS: 69 U/L (ref 39–117)
ALT: 38 U/L (ref 0–53)
AST: 26 U/L (ref 0–37)
Bilirubin, Direct: 0 mg/dL (ref 0.0–0.3)
Total Bilirubin: 0.5 mg/dL (ref 0.2–1.2)
Total Protein: 7.4 g/dL (ref 6.0–8.3)

## 2014-05-25 ENCOUNTER — Other Ambulatory Visit: Payer: Self-pay

## 2014-05-25 DIAGNOSIS — E119 Type 2 diabetes mellitus without complications: Secondary | ICD-10-CM

## 2014-05-30 ENCOUNTER — Telehealth: Payer: Self-pay | Admitting: Internal Medicine

## 2014-05-30 ENCOUNTER — Other Ambulatory Visit: Payer: Self-pay | Admitting: *Deleted

## 2014-05-30 DIAGNOSIS — E876 Hypokalemia: Secondary | ICD-10-CM

## 2014-05-30 MED ORDER — ATORVASTATIN CALCIUM 80 MG PO TABS: 80.0000 mg | ORAL_TABLET | Freq: Every day | ORAL | Status: DC

## 2014-05-30 MED ORDER — PANTOPRAZOLE SODIUM 40 MG PO TBEC: 40.0000 mg | DELAYED_RELEASE_TABLET | Freq: Two times a day (BID) | ORAL | Status: DC

## 2014-05-30 NOTE — Telephone Encounter (Signed)
Rx sent. Needs office visit for further refills. 

## 2014-05-31 ENCOUNTER — Other Ambulatory Visit: Payer: BC Managed Care – PPO

## 2014-06-01 ENCOUNTER — Ambulatory Visit: Payer: BC Managed Care – PPO | Admitting: Cardiology

## 2014-06-02 ENCOUNTER — Telehealth: Payer: Self-pay | Admitting: *Deleted

## 2014-06-02 NOTE — Telephone Encounter (Signed)
[facility] has approved patient's medication from 05/03/14-06/02/15. PA# is [id number].

## 2014-06-08 ENCOUNTER — Other Ambulatory Visit: Payer: Self-pay | Admitting: *Deleted

## 2014-06-08 ENCOUNTER — Other Ambulatory Visit (INDEPENDENT_AMBULATORY_CARE_PROVIDER_SITE_OTHER): Payer: BC Managed Care – PPO

## 2014-06-08 DIAGNOSIS — E119 Type 2 diabetes mellitus without complications: Secondary | ICD-10-CM

## 2014-06-08 LAB — LIPID PANEL
Cholesterol: 133 mg/dL (ref 0–200)
HDL: 28.4 mg/dL — ABNORMAL LOW (ref >39.00)
LDL Cholesterol: 77 mg/dL (ref 0–99)
NONHDL: 104.6
Total CHOL/HDL Ratio: 5
Triglycerides: 136 mg/dL (ref 0.0–149.0)
VLDL: 27.2 mg/dL (ref 0.0–40.0)

## 2014-06-08 LAB — HEMOGLOBIN A1C: HEMOGLOBIN A1C: 6.9 % — AB (ref 4.6–6.5)

## 2014-06-08 MED ORDER — METOPROLOL TARTRATE 25 MG PO TABS: 25.0000 mg | ORAL_TABLET | Freq: Two times a day (BID) | ORAL | Status: DC

## 2014-06-10 ENCOUNTER — Ambulatory Visit (INDEPENDENT_AMBULATORY_CARE_PROVIDER_SITE_OTHER): Payer: BC Managed Care – PPO | Admitting: Cardiology

## 2014-06-10 ENCOUNTER — Encounter: Payer: Self-pay | Admitting: Cardiology

## 2014-06-10 VITALS — BP 137/85 | HR 94 | Ht 72.0 in | Wt 254.0 lb

## 2014-06-10 DIAGNOSIS — I10 Essential (primary) hypertension: Secondary | ICD-10-CM

## 2014-06-10 DIAGNOSIS — I2583 Coronary atherosclerosis due to lipid rich plaque: Secondary | ICD-10-CM

## 2014-06-10 DIAGNOSIS — I2510 Atherosclerotic heart disease of native coronary artery without angina pectoris: Secondary | ICD-10-CM

## 2014-06-10 NOTE — Patient Instructions (Signed)
Your physician recommends that you schedule a follow-up appointment in: one year with Dr. Hochrein  

## 2014-06-10 NOTE — Progress Notes (Signed)
HPI The patient presents follow up of CAD with bypass in 2007. Over the past year he's done well from a cardiovascular standpoint. Unfortunately he's had a severe flare of pain rheumatoid arthritis and has been doing this. Despite the severe pain and is not having any new cardiovascular symptoms.The patient denies any new symptoms such as chest discomfort, neck or arm discomfort. There has been no new shortness of breath, PND or orthopnea. There have been no reported palpitations, presyncope or syncope.    No Known Allergies  Current Outpatient Prescriptions  Medication Sig Dispense Refill  . albuterol (PROVENTIL,VENTOLIN) 90 MCG/ACT inhaler Inhale 2 puffs into the lungs every 4 (four) hours as needed for wheezing or shortness of breath.  17 g  prn  . amitriptyline (ELAVIL) 75 MG tablet Take 75 mg by mouth at bedtime.       . Ascorbic Acid (VITAMIN C) 500 MG tablet Take 500 mg by mouth daily.        Marland Kitchen aspirin 81 MG tablet Take 81 mg by mouth daily.        Marland Kitchen atorvastatin (LIPITOR) 80 MG tablet Take 1 tablet (80 mg total) by mouth daily.  30 tablet  0  . Carbonyl Iron (PERFECT IRON PO) Take 1 tablet by mouth daily.       . Fluticasone-Salmeterol (ADVAIR DISKUS) 100-50 MCG/DOSE AEPB Inhale 1 puff into the lungs 2 (two) times daily.  60 each  5  . folic acid (FOLVITE) 800 MCG tablet Take 800 mcg by mouth daily.      . furosemide (LASIX) 20 MG tablet Take 20-40 mg by mouth 2 (two) times daily. 40 mg in the morning and 20 mg in the evening      . glucose blood (ONE TOUCH ULTRA TEST) test strip Test blood sugar once daily. Dx Code: 250.00  50 each  12  . Lancets (ONETOUCH ULTRASOFT) lancets USE AS DIRECTED  300 each  2  . methotrexate (RHEUMATREX) 2.5 MG tablet Take 20 mg by mouth once a week. 8 tablets every Thursday      . methotrexate (RHEUMATREX) 2.5 MG tablet Take 20 mg by mouth.      . metoprolol tartrate (LOPRESSOR) 25 MG tablet Take 1 tablet (25 mg total) by mouth 2 (two) times daily.  180  tablet  3  . Misc. Devices (ACAPELLA) MISC Use as directed  1 each  0  . nitroGLYCERIN (NITROSTAT) 0.4 MG SL tablet Place 1 tablet (0.4 mg total) under the tongue every 5 (five) minutes as needed for chest pain.  25 tablet  1  . NON FORMULARY EVERGO PORTABLE PULSE DOSE OXYGEN oxygen 3L per nasal cannula 24 hours a day Dx: 496      . oxyCODONE-acetaminophen (PERCOCET) 7.5-325 MG per tablet Take 1 tablet by mouth 4 (four) times daily. 3 times a day      . pantoprazole (PROTONIX) 40 MG tablet Take 1 tablet (40 mg total) by mouth 2 (two) times daily.  60 tablet  0  . potassium chloride SA (K-DUR,KLOR-CON) 20 MEQ tablet Take 20-40 mEq by mouth 2 (two) times daily. 40 meq in the morning and 20 meq at 4 pm      . predniSONE (DELTASONE) 5 MG tablet Take 5 mg by mouth daily.      . pregabalin (LYRICA) 150 MG capsule Take 150 mg by mouth 2 (two) times daily.       Marland Kitchen Respiratory Therapy Supplies (FLUTTER) DEVI Blow through several times,  2 or 3 sets per day as needed  1 each  0  . tiotropium (SPIRIVA HANDIHALER) 18 MCG inhalation capsule Place 1 capsule (18 mcg total) into inhaler and inhale daily.  30 capsule  5  . UNABLE TO FIND Med Name: Incentive Spirometer Use as directed  1 Device  0  . vitamin B-12 (CYANOCOBALAMIN) 250 MCG tablet Take 250 mcg by mouth daily.        . [DISCONTINUED] omeprazole (PRILOSEC OTC) 20 MG tablet Take 1 tablet (20 mg total) by mouth daily.  60 tablet  0   No current facility-administered medications for this visit.    Past Medical History  Diagnosis Date  . Rheumatoid arthritis(714.0)   . Psoriasis   . Hypertension   . Gastroesophageal reflux disease   . COPD (chronic obstructive pulmonary disease)   . CAD (coronary artery disease)     MI 2007, CABG  . Osteoporosis     per DEXA 05-2008  . Peripheral polyneuropathy     Severe, causing problems with his feet, see surgeries  . Diabetes mellitus     Dx 07-2008 A1C 6.2  . Onychomycosis     toenails  . Hiatal  hernia   . Esophageal dysmotility   . Gastroparesis   . Fatty liver 10/11/09  . Pulmonary fibrosis   . Neuropathy     (related to RA per neuro note 05-2011)  . Oxygen dependent     3l  . CHF (congestive heart failure)     pt stated he doesnt have this  . Complication of anesthesia     DIFFICULTY AFTER CABG HAD CONFUSION POST OP  . Myocardial infarction 2007  . Shortness of breath   . Cataracts, bilateral     Past Surgical History  Procedure Laterality Date  . Foot surgery      to tendon release and repair of osteomyelitis   . Cholecystectomy    . Coronary artery bypass graft  2007    (LIMA to the LAD, left radial to obtuse marginal, SVG to first diagonal, SVG to PDA. His ejection fraction to 50-55%  . Achiles tendon surgery  8/09  . Toe amputation  4-12/ 3 14    d/t a deformity, found to have osteomyelitis, complicated by post-op foot strtess FX  . Esophagogastroduodenoscopy N/A 10/19/2012    Procedure: ESOPHAGOGASTRODUODENOSCOPY (EGD);  Surgeon: Hart Carwin, MD;  Location: [facility] ENDOSCOPY;  Service: Endoscopy;  Laterality: N/A;  the dilatation is possible.   . Laryngoscopy  01/21/2014     DR BATES   . Laryngoscopy N/A 01/21/2014    Procedure: LARYNGOSCOPY;  Surgeon: Christia Reading, MD;  Location: [facility] OR;  Service: ENT;  Laterality: N/A;  micro direct laryngoscopy with prolaryn injection/jet venturi ventilation   ROS   As stated in the HPI and negative for all other systems.   PHYSICAL EXAM BP 137/85  Pulse 94  Ht 6' (1.829 m)  Wt 254 lb (115.214 kg)  BMI 34.44 kg/m2 General: Well developed, well nourished, in no acute distress.  Head: normocephalic and atraumatic  Mouth: Teeth, gums and palate normal. Oral mucosa normal.  Neck: Neck supple, no JVD. No masses, thyromegaly or abnormal cervical nodes.  Chest Wall: well-healed sternotomy scar  Lungs: Clear bilaterally to auscultation and percussion.  Heart: S1 and S2 within normal limits, no S3, no S4, no clicks, no rubs, no  murmurs  Abdomen: Bowel sounds positive; abdomen soft and non-tender without masses, organomegaly, or hernias noted. No hepatosplenomegaly, obese  Msk: severe rheumatoid changes  Extremities: trace bilateral edema, his legs are in diabetic boots and I was unable to appreciate posterior tibialis or dorsalis pulses   EKG:  Sinus rhythm, rate 94, axis within normal limits, intervals within normal limits, no acute ST-T wave changes. Premature ventricular contractions. 06/10/2014   ASSESSMENT AND PLAN  CAD:  The patient has no new sypmtoms.  No further cardiovascular testing is indicated.  We will continue with aggressive risk reduction and meds as listed. He has had no symptoms since his last stress test.  HTN:  His blood pressure is well controlled. He will continue the meds as listed.  DYSLIPIDEMIA:  His lipid profile is excellent. He will continue the meds as listed.  Lab Results  Component Value Date   CHOL 133 06/08/2014   TRIG 136.0 06/08/2014   HDL 28.40* 06/08/2014   LDLCALC 77 06/08/2014    DM:  Per Willow Ora, MD  Lab Results  Component Value Date   HGBA1C 6.9* 06/08/2014

## 2014-06-25 ENCOUNTER — Other Ambulatory Visit: Payer: Self-pay | Admitting: Cardiology

## 2014-06-26 ENCOUNTER — Other Ambulatory Visit: Payer: Self-pay

## 2014-06-26 MED ORDER — POTASSIUM CHLORIDE CRYS ER 20 MEQ PO TBCR: 20.0000 meq | EXTENDED_RELEASE_TABLET | Freq: Two times a day (BID) | ORAL | Status: DC

## 2014-07-17 ENCOUNTER — Other Ambulatory Visit: Payer: Self-pay | Admitting: Internal Medicine

## 2014-07-18 ENCOUNTER — Other Ambulatory Visit: Payer: Self-pay

## 2014-08-20 ENCOUNTER — Other Ambulatory Visit: Payer: Self-pay | Admitting: Pulmonary Disease

## 2014-08-25 ENCOUNTER — Telehealth: Payer: Self-pay | Admitting: Cardiology

## 2014-08-25 MED ORDER — FUROSEMIDE 20 MG PO TABS: 20.0000 mg | ORAL_TABLET | Freq: Two times a day (BID) | ORAL | Status: DC

## 2014-08-25 NOTE — Telephone Encounter (Signed)
Joseph [name] is calling because his pharmacy has been trying all day to get his refill request filled , he is trying to get it refill before tomorrow so he does not have to pay anything today, by tomorrow it will cost him . Please call if you have any questions .Marland Kitchen   Thanks

## 2014-08-25 NOTE — Telephone Encounter (Signed)
Returned call to patient he stated he needed 90 day refill for furosemide.Refill sent to pharmacy.

## 2014-08-31 ENCOUNTER — Encounter: Payer: Self-pay | Admitting: Pulmonary Disease

## 2014-08-31 ENCOUNTER — Ambulatory Visit (INDEPENDENT_AMBULATORY_CARE_PROVIDER_SITE_OTHER): Payer: BC Managed Care – PPO | Admitting: Pulmonary Disease

## 2014-08-31 VITALS — BP 126/70 | HR 79 | Temp 97.0°F | Wt 256.2 lb

## 2014-08-31 DIAGNOSIS — J438 Other emphysema: Secondary | ICD-10-CM

## 2014-08-31 DIAGNOSIS — J471 Bronchiectasis with (acute) exacerbation: Secondary | ICD-10-CM

## 2014-08-31 MED ORDER — FLUTTER DEVI
Status: DC
Start: 2014-08-31 — End: 2015-07-11

## 2014-08-31 MED ORDER — CIPROFLOXACIN HCL 750 MG PO TABS: 750.0000 mg | ORAL_TABLET | Freq: Two times a day (BID) | ORAL | Status: DC

## 2014-08-31 NOTE — Progress Notes (Signed)
   Subjective:    Patient ID: Joseph [name], male    DOB: [birth year], 64 y.o.   MRN: [id number]  HPI The patient comes in today for an acute sick visit. He has known COPD, bronchiectasis, recurrent aspiration pneumonia, and immunosuppression related to methotrexate for his rheumatoid arthritis. He gives a 2 week history of increasing chest congestion, increasing mucus quantity over his usual baseline, and development of green mucus. He has also gotten more congested, and has some increased shortness of breath. He has not had any fevers, chills, or sweats.   Review of Systems  Constitutional: Negative for fever and unexpected weight change.  HENT: Positive for congestion. Negative for dental problem, ear pain, nosebleeds, postnasal drip, rhinorrhea, sinus pressure, sneezing, sore throat and trouble swallowing.   Eyes: Negative for redness and itching.  Respiratory: Positive for cough, chest tightness, shortness of breath and wheezing.   Cardiovascular: Negative for palpitations and leg swelling.  Gastrointestinal: Negative for nausea and vomiting.  Genitourinary: Negative for dysuria.  Musculoskeletal: Negative for joint swelling.  Skin: Negative for rash.  Neurological: Negative for headaches.  Hematological: Does not bruise/bleed easily.  Psychiatric/Behavioral: Negative for dysphoric mood. The patient is not nervous/anxious.        Objective:   Physical Exam Well-developed male in no acute distress Nose without purulence or discharge noted Neck without lymphadenopathy or thyromegaly Chest with minimal basilar crackles, and surprisingly fairly clear. No wheezing noted Cardiac exam with regular rate and rhythm Lower extremities with mild edema, venous stasis changes noted. Alert and oriented, moves all 4 extremities.       Assessment & Plan:

## 2014-08-31 NOTE — Assessment & Plan Note (Signed)
The patient has a known history of bronchiectasis, and has seen a significant increase in his mucus production as well as a change in character. He is also more congested and has some increased shortness of breath.  I would like to treat him with a course of antibiotics for a bronchiectasis flare, and he has grown a pansensitive gram-negative rod in his sputum in the past. Will treat him with a course of Cipro, and also provide him with a new flutter valve.

## 2014-08-31 NOTE — Patient Instructions (Signed)
Will treat with ciprofloxacin 750mg  one in am and pm for 7 days. Can use mucinex am and pm to help with mucus Will get you a new flutter valve Keep followup with Dr. , but call Craige Cotta if not getting better.

## 2014-09-04 NOTE — Progress Notes (Signed)
Reviewed and agree with assessment/plan. 

## 2014-09-05 ENCOUNTER — Telehealth: Payer: Self-pay | Admitting: Pulmonary Disease

## 2014-09-05 NOTE — Telephone Encounter (Signed)
I spoke with the Joseph [name] and he states he has been trying to learn about his COPD and he is wanting to discuss the PFT he has in 11-2012. Results are scanned in. Appt set for Wed for Joseph [name] to discuss with VS per Joseph [name] request. Carron Curie, CMA

## 2014-09-07 ENCOUNTER — Encounter: Payer: Self-pay | Admitting: Pulmonary Disease

## 2014-09-07 ENCOUNTER — Ambulatory Visit (INDEPENDENT_AMBULATORY_CARE_PROVIDER_SITE_OTHER): Payer: BC Managed Care – PPO | Admitting: Pulmonary Disease

## 2014-09-07 ENCOUNTER — Ambulatory Visit (INDEPENDENT_AMBULATORY_CARE_PROVIDER_SITE_OTHER)
Admission: RE | Admit: 2014-09-07 | Discharge: 2014-09-07 | Disposition: A | Discharge status: Home / Self Care | Payer: BC Managed Care – PPO | Source: Ambulatory Visit | Attending: Pulmonary Disease | Admitting: Pulmonary Disease

## 2014-09-07 VITALS — BP 122/78 | HR 89 | Temp 97.5°F | Ht 73.0 in | Wt 256.4 lb

## 2014-09-07 DIAGNOSIS — J432 Centrilobular emphysema: Secondary | ICD-10-CM

## 2014-09-07 DIAGNOSIS — J471 Bronchiectasis with (acute) exacerbation: Secondary | ICD-10-CM

## 2014-09-07 DIAGNOSIS — R918 Other nonspecific abnormal finding of lung field: Secondary | ICD-10-CM

## 2014-09-07 DIAGNOSIS — J9611 Chronic respiratory failure with hypoxia: Secondary | ICD-10-CM

## 2014-09-07 DIAGNOSIS — J189 Pneumonia, unspecified organism: Secondary | ICD-10-CM

## 2014-09-07 NOTE — Patient Instructions (Signed)
Chest xray today Follow up in 8 weeks

## 2014-09-07 NOTE — Progress Notes (Signed)
Chief Complaint  Patient presents with  . Follow-up    Review PFT. Pt reports having some increased SOB, cough and mucus production since Christmas. Pt states that he is noticing that everytime he gets sick he is not bouncing back to baseline.     CC: Joseph [name], Joseph [name]  History of Present Illness: Joseph [name] is a 64 y.o. male former smoker with COPD/emphysema, rheumatoid arthritis, recurrent pneumonia 2nd to chronic aspiration/reflux, bronchiectasis, chronic respiratory failure on 3 liters oxygen 24/7, and pulmonary nodules.  He was seen on 08/31/14 by Dr. Shelle Iron for BTX exacerbation.  He was tx with cipro.  He is slowly improving, but feels like his recovery time is much longer than usual.  He still has cough and sputum.  He denies chest pain or fever.  He has not had skin rash.  He has been getting MTX injections.  He remains on prednisone, and plan is to reduce dose to 8 mg daily.  TESTS: PFT 01/12/09 >> FEV1 3.02 (85%), FEV1% 68, TLC 6.35 (88%), DLCO 45%, no BD CT chest 10/17/12 >> severe centrilobular emphysema, apical bullae, 1 cm nodule LUL new, 5 mm nodule RLL stable since 2009, BTX with GGO at bases PFT 12/02/12 >> FEV1 3.11 (78%), FEV1% 77, TLC 6.32 (83%), DLCO 37%, no BD CT chest 05/06/13 >> decreased size LUL nodule now 8 mm, 4 mm nodule RLL, 3 mm nodule RML, 7 mm nodule RML, 8 mm nodule lingula CT chest 04/05/14 >> atherosclerosis, moderate diffuse centrilobular and paraseptal emphysema, stable pulmonary nodules with dominant LUL nodule now 7 mm, steatosis of liver with ?early cirrhosis  PMHx >> RA, Psoriasis, HTN, CAD s/p CABG, GERD, HH, Gastroparesis, DM, Peripheral neuropathy  PSHx, Medications, Allergies, Fhx, Shx reviewed.   Physical Exam: Blood pressure 122/78, pulse 89, temperature 97.5 F (36.4 C), temperature source Oral, height 6\' 1"  (1.854 m), weight 256 lb 6.4 oz (116.302 kg), SpO2 92 %. Body mass index is 33.84 kg/(m^2).  General - No  distress, wearing oxygen ENT - No sinus tenderness, no oral exudate, no LAN Cardiac - s1s2 regular, no murmur Chest - decreased breath sounds, no wheeze/rales Back - No focal tenderness Abd - Soft, non-tender Ext - deformities of rheumatoid arthritis Neuro - Normal strength Skin - No rashes Psych - normal mood, and behavior   Assessment/Plan:  COPD/emphysema. He has slow to resolve exacerbation. Plan: - continue spiriva, advair, and prn albuterol - I don't think he needs additional antibiotics, and can continue current maintenance dose of prednisone for RA  Recurrent aspiration PNA with hx of GERD, HH, and gastroparesis. Plan: - will repeat CXR today - continue bid protonix per GI  Bronchiectasis. Plan: - continue mucinex, flutter valve  Chronic respiratory failure. Related to COPD/Emphysema. Plan: - continue 3 liters oxygen 24/7  Pulmonary nodules. Plan: - he will need CT chest w/o contrast in August 2016  Hx of Rheumatoid arthritis. Plan: - he is on IV MTX, and chronic prednisone per rheumatology   September 2016, MD [facility] Pager:  [phone]

## 2014-09-19 ENCOUNTER — Telehealth: Payer: Self-pay | Admitting: Pulmonary Disease

## 2014-09-19 NOTE — Telephone Encounter (Signed)
Dg Chest 2 View  09/07/2014   CLINICAL DATA:  Dyspnea; history of bronchiectasis and bullous emphysema and pulmonary fibrosis; history of previous MI  EXAM: CHEST  2 VIEW  COMPARISON:  CT scan of the chest of April 05, 2014 and PA and lateral chest x-ray of April 09, 2013  FINDINGS: The lungs remain mildly hyperinflated. There is a ring-like density in the periphery of the left mid lung consistent with a known bullous lesion. There are coarse interstitial markings in the mid and lower lung zones bilaterally. The cardiac silhouette is top-normal in size. The pulmonary vascularity is not engorged. There is no pleural effusion or pneumothorax. The patient has undergone previous median sternotomy for CABG. The bony thorax exhibits no acute abnormality.  IMPRESSION: Bullous emphysema with chronic pulmonary fibrotic changes. There is no evidence of pneumonia, CHF, or other acute abnormality.   Electronically Signed   By: David  [place]   On: 09/07/2014 16:09    Will have my nurse inform pt that CXR showed chronic changes from emphysema.  No evidence for pneumonia.

## 2014-09-19 NOTE — Telephone Encounter (Signed)
LMTCB x 1 

## 2014-09-20 NOTE — Telephone Encounter (Signed)
Results have been explained to patient, pt expressed understanding. Nothing further needed.  

## 2014-09-21 ENCOUNTER — Other Ambulatory Visit: Payer: Self-pay | Admitting: Pulmonary Disease

## 2014-10-20 ENCOUNTER — Telehealth: Payer: Self-pay | Admitting: Pulmonary Disease

## 2014-10-20 MED ORDER — AZITHROMYCIN 500 MG PO TABS: 500.0000 mg | ORAL_TABLET | Freq: Every day | ORAL | Status: DC

## 2014-10-20 NOTE — Telephone Encounter (Signed)
lmtcb x1 

## 2014-10-20 NOTE — Telephone Encounter (Signed)
Send script for zithromax 500 mg, 1 pill daily for 7 days.

## 2014-10-20 NOTE — Telephone Encounter (Signed)
Pt is aware that we will send in rx. Nothing further was needed.

## 2014-10-20 NOTE — Telephone Encounter (Signed)
Spoke with pt, states his cough and congestion is worsening this past month.  Pt has white/green mucus, all in chest.  Pt has shallow breathing, no fever.  No sinus congestion.  Pt uses [facility] in [place].  Pt has appt with VS on Monday but is requesting recs to get through the weekend.  VS please advise. Thanks!

## 2014-10-24 ENCOUNTER — Ambulatory Visit (INDEPENDENT_AMBULATORY_CARE_PROVIDER_SITE_OTHER): Payer: BC Managed Care – PPO | Admitting: Pulmonary Disease

## 2014-10-24 ENCOUNTER — Encounter: Payer: Self-pay | Admitting: Pulmonary Disease

## 2014-10-24 VITALS — BP 122/82 | HR 84 | Temp 97.7°F | Ht 72.0 in | Wt 252.0 lb

## 2014-10-24 DIAGNOSIS — J471 Bronchiectasis with (acute) exacerbation: Secondary | ICD-10-CM

## 2014-10-24 MED ORDER — ALBUTEROL SULFATE HFA 108 (90 BASE) MCG/ACT IN AERS: 2.0000 | INHALATION_SPRAY | RESPIRATORY_TRACT | Status: DC | PRN

## 2014-10-24 MED ORDER — TIOTROPIUM BROMIDE MONOHYDRATE 18 MCG IN CAPS: 18.0000 ug | ORAL_CAPSULE | Freq: Every day | RESPIRATORY_TRACT | Status: DC

## 2014-10-24 MED ORDER — FLUTICASONE-SALMETEROL 100-50 MCG/DOSE IN AEPB: 1.0000 | INHALATION_SPRAY | Freq: Two times a day (BID) | RESPIRATORY_TRACT | Status: DC

## 2014-10-24 MED ORDER — PREDNISONE 10 MG PO TABS: ORAL_TABLET | ORAL | Status: DC

## 2014-10-24 NOTE — Progress Notes (Signed)
Chief Complaint  Patient presents with  . Acute Visit    Pt c/o cough with mucus production and increased SOB. Pt has been taking Mucinex - some improvement in cough/congestion.     CC: Joseph [name], Joseph [name]  History of Present Illness: Joseph [name] is a 64 y.o. male former smoker with COPD/emphysema, rheumatoid arthritis, recurrent pneumonia 2nd to chronic aspiration/reflux, bronchiectasis, chronic respiratory failure on 3 liters oxygen 24/7, and pulmonary nodules.  He was started on zithromax last week.  His wife feels that his cough and sputum have gotten better.  He still feels short of breath.  He tries using mucinex and flutter valve, but feels like he can't clear his chest congestion.  However, he does not think there is as much congestion as before.  He gets winded with minimal activity.  He is getting a ratlling in his chest.  He denies fever, or leg swelling.  His weight has been steady.  He has been getting MTX injections.  He increased his prednisone to 9 mg due to joint pain.  He has f/u with rheumatology in April.  TESTS: PFT 01/12/09 >> FEV1 3.02 (85%), FEV1% 68, TLC 6.35 (88%), DLCO 45%, no BD CT chest 10/17/12 >> severe centrilobular emphysema, apical bullae, 1 cm nodule LUL new, 5 mm nodule RLL stable since 2009, BTX with GGO at bases PFT 12/02/12 >> FEV1 3.11 (78%), FEV1% 77, TLC 6.32 (83%), DLCO 37%, no BD CT chest 05/06/13 >> decreased size LUL nodule now 8 mm, 4 mm nodule RLL, 3 mm nodule RML, 7 mm nodule RML, 8 mm nodule lingula CT chest 04/05/14 >> atherosclerosis, moderate diffuse centrilobular and paraseptal emphysema, stable pulmonary nodules with dominant LUL nodule now 7 mm, steatosis of liver with ?early cirrhosis  PMHx >> RA, Psoriasis, HTN, CAD s/p CABG, GERD, HH, Gastroparesis, DM, Peripheral neuropathy  PSHx, Medications, Allergies, Fhx, Shx reviewed.   Physical Exam: Blood pressure 122/82, pulse 84, temperature 97.7 F (36.5 C), temperature  source Oral, height 6' (1.829 m), weight 252 lb (114.306 kg), SpO2 94 %. There is no weight on file to calculate BMI.  General - No distress, wearing oxygen ENT - No sinus tenderness, no oral exudate, no LAN Cardiac - s1s2 regular, no murmur Chest - faint inspiratory squeaks b/l  Back - No focal tenderness Abd - Soft, non-tender Ext - deformities of rheumatoid arthritis Neuro - Normal strength Skin - No rashes Psych - normal mood, and behavior   Assessment/Plan:  Acute exacerbation of COPD and BTX. Plan: - finish course of zithromax - will give tapering course of prednisone - he might need alternating course of Abx to full clear his symptoms >> consider 1 week of levaquin at next follow up in 2 weeks  COPD/emphysema. Plan: - continue spiriva, advair, and prn albuterol  Recurrent aspiration PNA with hx of GERD, HH, and gastroparesis. Plan: - continue bid protonix per GI  Bronchiectasis. Plan: - continue mucinex, flutter valve - if he gets more difficulty with expectorating, then might need to consider chest percussion vest  Chronic respiratory failure. Related to COPD/Emphysema. Plan: - continue 3 liters oxygen 24/7  Pulmonary nodules. Plan: - he will need CT chest w/o contrast in August 2016  Hx of Rheumatoid arthritis. Explained how some of his lung disease could be related to rheumatoid arthritis. Plan: - he is on IV MTX, and chronic prednisone per rheumatology   [name], MD [facility] Pager:  [phone]

## 2014-10-24 NOTE — Patient Instructions (Signed)
Prednisone 10 mg pill >> 4 pills daily for 3 days, then 3 pills daily for 3 days, then 2 pills daily for 3 days, then resume prednisone 9 mg daily Finish course of zithromax Follow up in Dr. Craige Cotta or Tammy Parrett in 2 weeks

## 2014-10-24 NOTE — Addendum Note (Signed)
Addended by: Maisie Fus on: 10/24/2014 04:05 PM   Modules accepted: Orders

## 2014-11-03 ENCOUNTER — Other Ambulatory Visit: Payer: Self-pay | Admitting: Internal Medicine

## 2014-11-07 ENCOUNTER — Ambulatory Visit: Payer: BC Managed Care – PPO | Admitting: Adult Health

## 2014-11-07 ENCOUNTER — Encounter: Payer: Self-pay | Admitting: Adult Health

## 2014-11-07 ENCOUNTER — Ambulatory Visit (INDEPENDENT_AMBULATORY_CARE_PROVIDER_SITE_OTHER): Payer: BC Managed Care – PPO | Admitting: Adult Health

## 2014-11-07 VITALS — BP 132/78 | HR 94 | Ht 72.0 in | Wt 259.0 lb

## 2014-11-07 DIAGNOSIS — J439 Emphysema, unspecified: Secondary | ICD-10-CM

## 2014-11-07 DIAGNOSIS — J9611 Chronic respiratory failure with hypoxia: Secondary | ICD-10-CM

## 2014-11-07 NOTE — Patient Instructions (Addendum)
Continue on current regimen  We will refer you to pulmonary rehab . Will send order for referral DME O2 concentrator for trip .  Follow up Dr. Craige Cotta  3 months and As needed

## 2014-11-09 ENCOUNTER — Other Ambulatory Visit: Payer: Self-pay

## 2014-11-09 ENCOUNTER — Encounter: Payer: Self-pay | Admitting: Internal Medicine

## 2014-11-09 ENCOUNTER — Ambulatory Visit (INDEPENDENT_AMBULATORY_CARE_PROVIDER_SITE_OTHER): Payer: BC Managed Care – PPO | Admitting: Internal Medicine

## 2014-11-09 VITALS — BP 128/64 | HR 81 | Temp 98.2°F | Ht 72.0 in | Wt 254.2 lb

## 2014-11-09 DIAGNOSIS — M069 Rheumatoid arthritis, unspecified: Secondary | ICD-10-CM

## 2014-11-09 DIAGNOSIS — J438 Other emphysema: Secondary | ICD-10-CM

## 2014-11-09 DIAGNOSIS — E119 Type 2 diabetes mellitus without complications: Secondary | ICD-10-CM

## 2014-11-09 LAB — COMPREHENSIVE METABOLIC PANEL
ALT: 60 U/L — AB (ref 0–53)
AST: 41 U/L — ABNORMAL HIGH (ref 0–37)
Albumin: 4 g/dL (ref 3.5–5.2)
Alkaline Phosphatase: 66 U/L (ref 39–117)
BUN: 15 mg/dL (ref 6–23)
CALCIUM: 9.4 mg/dL (ref 8.4–10.5)
CHLORIDE: 104 meq/L (ref 96–112)
CO2: 26 meq/L (ref 19–32)
CREATININE: 0.73 mg/dL (ref 0.40–1.50)
GFR: 115.42 mL/min (ref >60.00)
Glucose, Bld: 182 mg/dL — ABNORMAL HIGH (ref 70–99)
Potassium: 4 mEq/L (ref 3.5–5.1)
Sodium: 138 mEq/L (ref 135–145)
Total Bilirubin: 0.5 mg/dL (ref 0.2–1.2)
Total Protein: 6.8 g/dL (ref 6.0–8.3)

## 2014-11-09 LAB — HEMOGLOBIN A1C: HEMOGLOBIN A1C: 7.1 % — AB (ref 4.6–6.5)

## 2014-11-09 NOTE — Progress Notes (Signed)
Pre visit review using our clinic review tool, if applicable. No additional management support is needed unless otherwise documented below in the visit note. 

## 2014-11-09 NOTE — Progress Notes (Signed)
Subjective:    Patient ID: Joseph [name], male    DOB: 10-Mar-1951, 64 y.o.   MRN: [id number]  DOS:  11/09/2014 Type of visit - description : rov, with his wife. Interval history: COPD, had exacerbation in the last few weeks, finally improving but not back to baseline CAD, saw cardiology in October, felt to be stable, note reviewed, follow-up in one year Rheumatoid A, finally his symptoms are decreasing, has not need a walker for the last 3 weeks. Needs labs done and sent to rheumatology Diabetes, CBGs were elevated when he was on a higher dose of prednisone, now is better with sugars of around 120  Review of Systems  Denies nausea, vomiting, diarrhea or blood in the stools No anxiety or depression  Past Medical History  Diagnosis Date  . Rheumatoid arthritis(714.0)   . Psoriasis   . Hypertension   . Gastroesophageal reflux disease   . COPD (chronic obstructive pulmonary disease)   . CAD (coronary artery disease)     MI 2007, CABG  . Osteoporosis     per DEXA 05-2008  . Peripheral polyneuropathy     Severe, causing problems with his feet, see surgeries  . Diabetes mellitus     Dx 07-2008 A1C 6.2  . Onychomycosis     toenails  . Hiatal hernia   . Esophageal dysmotility   . Gastroparesis   . Fatty liver 10/11/09  . Pulmonary fibrosis   . Neuropathy     (related to RA per neuro note 05-2011)  . Oxygen dependent     3l  . CHF (congestive heart failure)     pt stated he doesnt have this  . Complication of anesthesia     DIFFICULTY AFTER CABG HAD CONFUSION POST OP  . Myocardial infarction 2007  . Shortness of breath   . Cataracts, bilateral     Past Surgical History  Procedure Laterality Date  . Foot surgery      to tendon release and repair of osteomyelitis   . Cholecystectomy    . Coronary artery bypass graft  2007    (LIMA to the LAD, left radial to obtuse marginal, SVG to first diagonal, SVG to PDA. His ejection fraction to 50-55%  . Achiles tendon surgery   8/09  . Toe amputation  4-12/ 3 14    d/t a deformity, found to have osteomyelitis, complicated by post-op foot strtess FX  . Esophagogastroduodenoscopy N/A 10/19/2012    Procedure: ESOPHAGOGASTRODUODENOSCOPY (EGD);  Surgeon: Hart Carwin, MD;  Location: [facility] ENDOSCOPY;  Service: Endoscopy;  Laterality: N/A;  the dilatation is possible.   . Laryngoscopy  01/21/2014     DR BATES   . Laryngoscopy N/A 01/21/2014    Procedure: LARYNGOSCOPY;  Surgeon: Christia Reading, MD;  Location: [facility] OR;  Service: ENT;  Laterality: N/A;  micro direct laryngoscopy with prolaryn injection/jet venturi ventilation    History   Social History  . Marital Status: Married    Spouse Name: N/A  . Number of Children: 0  . Years of Education: N/A   Occupational History  . disabled-retired    Social History Main Topics  . Smoking status: Former Smoker -- 1.00 packs/day for 40 years    Quit date: 08/26/2005  . Smokeless tobacco: Former Neurosurgeon    Quit date: 04/02/2006     Comment: smoked x 40 years, 1 ppd  . Alcohol Use: No  . Drug Use: No  . Sexual Activity: Not on file   Other  Topics Concern  . Not on file   Social History Narrative        Medication List       This list is accurate as of: 11/09/14 11:59 PM.  Always use your most recent med list.               ACAPELLA Misc  Use as directed     albuterol 108 (90 BASE) MCG/ACT inhaler  Commonly known as:  PROVENTIL HFA;VENTOLIN HFA  Inhale 2 puffs into the lungs every 4 (four) hours as needed for wheezing or shortness of breath.     amitriptyline 75 MG tablet  Commonly known as:  ELAVIL  Take 1 tablet at 4pm and at bedtime     aspirin 81 MG tablet  Take 81 mg by mouth daily.     atorvastatin 80 MG tablet  Commonly known as:  LIPITOR  TAKE 1 TABLET BY MOUTH DAILY     Fluticasone-Salmeterol 100-50 MCG/DOSE Aepb  Commonly known as:  ADVAIR DISKUS  Inhale 1 puff into the lungs 2 (two) times daily.     FLUTTER Devi  Blow through several times,  2 or 3 sets per day as needed     folic acid 800 MCG tablet  Commonly known as:  FOLVITE  Take 800 mcg by mouth daily.     furosemide 20 MG tablet  Commonly known as:  LASIX  Take 1-2 tablets (20-40 mg total) by mouth 2 (two) times daily. 40 mg in the morning and 20 mg in the evening     glucose blood test strip  Commonly known as:  ONE TOUCH ULTRA TEST  Test blood sugar once daily. Dx Code: 250.00     LOTEMAX 0.5 % Gel  Generic drug:  Loteprednol Etabonate  daily     Methotrexate Sodium (PF) 50 MG/2ML Soln  Inject 25 mg into the skin. Once a week     metoprolol tartrate 25 MG tablet  Commonly known as:  LOPRESSOR  Take 1 tablet (25 mg total) by mouth 2 (two) times daily.     nitroGLYCERIN 0.4 MG SL tablet  Commonly known as:  NITROSTAT  Place 1 tablet (0.4 mg total) under the tongue every 5 (five) minutes as needed for chest pain.     NON FORMULARY  - EVERGO PORTABLE PULSE DOSE OXYGEN  - oxygen 3L per nasal cannula 24 hours a day  - Dx: 496     onetouch ultrasoft lancets  USE AS DIRECTED     oxyCODONE-acetaminophen 7.5-325 MG per tablet  Commonly known as:  PERCOCET  Take 1 tablet by mouth 4 (four) times daily. 3 times a day     pantoprazole 40 MG tablet  Commonly known as:  PROTONIX  Take 1 tablet (40 mg total) by mouth 2 (two) times daily.     PERFECT IRON PO  Take 1 tablet by mouth every morning.     potassium chloride SA 20 MEQ tablet  Commonly known as:  K-DUR,KLOR-CON  Take 1-2 tablets (20-40 mEq total) by mouth 2 (two) times daily. 40 meq in the morning and 20 meq at 4 pm     predniSONE 5 MG tablet  Commonly known as:  DELTASONE  Take 9 mg by mouth daily.     pregabalin 150 MG capsule  Commonly known as:  LYRICA  Take 150 mg by mouth 2 (two) times daily. AT BREAKFAST AND AT BEDTIME     tiotropium 18 MCG inhalation capsule  Commonly known as:  SPIRIVA HANDIHALER  Place 1 capsule (18 mcg total) into inhaler and inhale daily.     UNABLE TO FIND    - Med Name: Incentive Spirometer  - Use as directed     vitamin B-12 250 MCG tablet  Commonly known as:  CYANOCOBALAMIN  Take 250 mcg by mouth daily.     vitamin C 500 MG tablet  Commonly known as:  ASCORBIC ACID  Take 500 mg by mouth daily.           Objective:   Physical Exam BP 128/64 mmHg  Pulse 81  Temp(Src) 98.2 F (36.8 C) (Oral)  Ht 6' (1.829 m)  Wt 254 lb 4 oz (115.327 kg)  BMI 34.47 kg/m2  SpO2 94%  General:   Well developed, well nourished . NAD.  HEENT:  Normocephalic . Face symmetric, atraumatic Lungs:  Decreased breath sounds but clear Normal respiratory effort, no intercostal retractions, no accessory muscle use. Heart: RRR,  no murmur.  Muscle skeletal: no pretibial edema bilaterally  Skin: Not pale. Not jaundice Neurologic:  alert & oriented X3.  Speech normal, gait difficulty due to RA Psych--  Cognition and judgment appear intact.  Cooperative with normal attention span and concentration.  Behavior appropriate. No anxious or depressed appearing.       Assessment & Plan:    Diabetes, check A1c, recently CBGs are better due to taking days prednisone  Rheumatoid arthritis, needs a CMP and CBC done at [facility], a paper prescription provided, will ask to send results to rheumatology and me  COPD, was recently seen several times by pulmonology, had exacerbation, gradually improving, now back to baseline, more dependent on oxygen, planning to pulmonary rehabilitation when possible.  Next visit 04-2015

## 2014-11-09 NOTE — Patient Instructions (Signed)
Get your blood work before you leave    Come back to the office by 04-2015   for a physical exam  Please schedule an appointment at the front desk    Come back fasting       

## 2014-11-11 ENCOUNTER — Other Ambulatory Visit: Payer: Self-pay | Admitting: Adult Health

## 2014-11-11 DIAGNOSIS — J439 Emphysema, unspecified: Secondary | ICD-10-CM

## 2014-11-14 NOTE — Assessment & Plan Note (Signed)
Cont on o2  referal for port concentrator

## 2014-11-14 NOTE — Assessment & Plan Note (Signed)
Recent flare now resolved   Plan Cont on current regimen  Refer to pulm rehab

## 2014-11-14 NOTE — Progress Notes (Signed)
   Subjective:    Patient ID: Joseph [name], male    DOB: [birth year], 64 y.o.   MRN: [id number]  HPI 64 yo male former smoker with COPD/emphysema, rheumatoid arthritis, recurrent pneumonia 2nd to chronic aspiration/reflux, bronchiectasis, chronic respiratory failure on 3 liters oxygen 24/7, and pulmonary nodules.  TESTS: PFT 01/12/09 >> FEV1 3.02 (85%), FEV1% 68, TLC 6.35 (88%), DLCO 45%, no BD CT chest 10/17/12 >> severe centrilobular emphysema, apical bullae, 1 cm nodule LUL new, 5 mm nodule RLL stable since 2009, BTX with GGO at bases PFT 12/02/12 >> FEV1 3.11 (78%), FEV1% 77, TLC 6.32 (83%), DLCO 37%, no BD CT chest 05/06/13 >> decreased size LUL nodule now 8 mm, 4 mm nodule RLL, 3 mm nodule RML, 7 mm nodule RML, 8 mm nodule lingula CT chest 04/05/14 >> atherosclerosis, moderate diffuse centrilobular and paraseptal emphysema, stable pulmonary nodules with dominant LUL nodule now 7 mm, steatosis of liver with ?early cirrhosis  PMHx >> RA, Psoriasis, HTN, CAD s/p CABG, GERD, HH, Gastroparesis, DM, Peripheral neuropathy  PSHx, Medications, Allergies, Fhx, Shx reviewed.   11/07/14 Follow up :  Pt returns for 2 week follow up  Seen last ov with AECOPD , tx w/ zpack and pred taper.  He is feeling better . Cough and wheezing are better.  Does have DOE , has to stop and rest-this is some better.  Requests referral to pulm rehab.  Also wants evaluation for portable concentrator .  No chest pain, orthopnea, edema or fever.    Review of Systems  Constitutional:   No  weight loss, night sweats,  Fevers, chills,  +fatigue, or  lassitude.  HEENT:   No headaches,  Difficulty swallowing,  Tooth/dental problems, or  Sore throat,                No sneezing, itching, ear ache,  +nasal congestion, post nasal drip,   CV:  No chest pain,  Orthopnea, PND, swelling in lower extremities, anasarca, dizziness, palpitations, syncope.   GI  No heartburn, indigestion, abdominal pain, nausea, vomiting,  diarrhea, change in bowel habits, loss of appetite, bloody stools.   Resp:  .  No chest wall deformity  Skin: no rash or lesions.  GU: no dysuria, change in color of urine, no urgency or frequency.  No flank pain, no hematuria   MS:  No joint pain or swelling.  No decreased range of motion.  No back pain.  Psych:  No change in mood or affect. No depression or anxiety.  No memory loss.          Objective:   Physical Exam GEN: A/Ox3; pleasant , NAD chronically ill appearing   HEENT:  [place]/AT,  EACs-clear, TMs-wnl, NOSE-clear, THROAT-clear, no lesions, no postnasal drip or exudate noted.   NECK:  Supple w/ fair ROM; no JVD; normal carotid impulses w/o bruits; no thyromegaly or nodules palpated; no lymphadenopathy.  RESP  Decreased BS in bases no , wheezes/ rales/ or rhonchi.no accessory muscle use, no dullness to percussion  CARD:  RRR, no m/r/g  , no peripheral edema, pulses intact, no cyanosis or clubbing.  GI:   Soft & nt; nml bowel sounds; no organomegaly or masses detected.  Musco: Warm bil, no deformities or joint swelling noted.   Neuro: alert, no focal deficits noted.    Skin: Warm, no lesions or rashes         Assessment & Plan:

## 2014-11-16 ENCOUNTER — Telehealth: Payer: Self-pay | Admitting: Internal Medicine

## 2014-11-16 NOTE — Telephone Encounter (Signed)
Spoke with Aggie Cosier and she said she sent in a Pulmonary Rehab form to [facility], need to have doctor signature on it.  Theresa sent to Dr. Sherene Sires to get him to sign document, calling to check on form, would like to get form faxed back to them today since their office will be closed Friday.    Jess/Leslie , do you have this form?

## 2014-11-16 NOTE — Telephone Encounter (Signed)
I don't have the form  Do you Jess?

## 2014-11-16 NOTE — Telephone Encounter (Signed)
Actually, yes.  It was just given to me earlier today Form given to [facility]

## 2014-11-16 NOTE — Telephone Encounter (Signed)
MW has signed and was faxed back

## 2014-11-17 ENCOUNTER — Other Ambulatory Visit: Payer: Self-pay | Admitting: Pulmonary Disease

## 2014-11-17 ENCOUNTER — Encounter: Payer: Self-pay | Admitting: Internal Medicine

## 2014-11-17 NOTE — Telephone Encounter (Signed)
error 

## 2014-12-08 ENCOUNTER — Telehealth: Payer: Self-pay | Admitting: Internal Medicine

## 2014-12-08 ENCOUNTER — Other Ambulatory Visit: Payer: Self-pay | Admitting: *Deleted

## 2014-12-08 ENCOUNTER — Other Ambulatory Visit: Payer: Self-pay | Admitting: Internal Medicine

## 2014-12-08 MED ORDER — PANTOPRAZOLE SODIUM 40 MG PO TBEC: 40.0000 mg | DELAYED_RELEASE_TABLET | Freq: Two times a day (BID) | ORAL | Status: DC

## 2014-12-08 NOTE — Telephone Encounter (Signed)
Additional rx sent

## 2014-12-19 ENCOUNTER — Other Ambulatory Visit: Payer: Self-pay | Admitting: Pulmonary Disease

## 2015-01-13 ENCOUNTER — Encounter: Payer: Self-pay | Admitting: Internal Medicine

## 2015-01-13 ENCOUNTER — Ambulatory Visit (INDEPENDENT_AMBULATORY_CARE_PROVIDER_SITE_OTHER): Payer: BC Managed Care – PPO | Admitting: Internal Medicine

## 2015-01-13 VITALS — BP 122/60 | HR 64 | Ht 70.5 in | Wt 237.0 lb

## 2015-01-13 DIAGNOSIS — J387 Other diseases of larynx: Secondary | ICD-10-CM | POA: Diagnosis not present

## 2015-01-13 DIAGNOSIS — K219 Gastro-esophageal reflux disease without esophagitis: Secondary | ICD-10-CM | POA: Diagnosis not present

## 2015-01-13 DIAGNOSIS — J439 Emphysema, unspecified: Secondary | ICD-10-CM | POA: Insufficient documentation

## 2015-01-13 MED ORDER — PANTOPRAZOLE SODIUM 40 MG PO TBEC: 40.0000 mg | DELAYED_RELEASE_TABLET | Freq: Two times a day (BID) | ORAL | Status: DC

## 2015-01-13 NOTE — Progress Notes (Signed)
Joseph [name] May 04, 1951 [id number]  Note: This dictation was prepared with Dragon digital system. Any transcriptional errors that result from this procedure are unintentional.   History of Present Illness: This is a 64 year old white male with severe gastroesophageal reflux, LPR, hx of hospitalization for  aspiration pneumonia in April 2014. He has advanced rheumatoid arthritis and used  narcotics for pain control he has a diabetic gastroparesis. Upper endoscopy in February 2014 showed  irregular z-line. But biopsies did not show metaplasia. He has been doing extremely well on pantoprazole 40 mg twice a day and strict antireflux measures. He also reduced his narcotic use and stopped Fosamax. He has no longer LPR and his reflux has almost disappeared. He is here today to refill pantoprazole    Past Medical History  Diagnosis Date  . Rheumatoid arthritis(714.0)   . Psoriasis   . Hypertension   . Gastroesophageal reflux disease   . COPD (chronic obstructive pulmonary disease)   . CAD (coronary artery disease)     MI 2007, CABG  . Osteoporosis     per DEXA 05-2008  . Peripheral polyneuropathy     Severe, causing problems with his feet, see surgeries  . Diabetes mellitus     Dx 07-2008 A1C 6.2  . Onychomycosis     toenails  . Hiatal hernia   . Esophageal dysmotility   . Gastroparesis   . Fatty liver 10/11/09  . Pulmonary fibrosis   . Neuropathy     (related to RA per neuro note 05-2011)  . Oxygen dependent     3l  . CHF (congestive heart failure)     pt stated he doesnt have this  . Complication of anesthesia     DIFFICULTY AFTER CABG HAD CONFUSION POST OP  . Myocardial infarction 2007  . Shortness of breath   . Cataracts, bilateral   . Rheumatoid arthritis     [facility] Rheumatology    Past Surgical History  Procedure Laterality Date  . Foot surgery      to tendon release and repair of osteomyelitis   . Cholecystectomy    . Coronary artery bypass graft  2007    (LIMA to  the LAD, left radial to obtuse marginal, SVG to first diagonal, SVG to PDA. His ejection fraction to 50-55%  . Achiles tendon surgery  8/09  . Toe amputation  4-12/ 3 14    d/t a deformity, found to have osteomyelitis, complicated by post-op foot strtess FX  . Esophagogastroduodenoscopy N/A 10/19/2012    Procedure: ESOPHAGOGASTRODUODENOSCOPY (EGD);  Surgeon: Hart Carwin, MD;  Location: [facility] ENDOSCOPY;  Service: Endoscopy;  Laterality: N/A;  the dilatation is possible.   . Laryngoscopy  01/21/2014     DR BATES   . Laryngoscopy N/A 01/21/2014    Procedure: LARYNGOSCOPY;  Surgeon: Christia Reading, MD;  Location[facility] OR;  Service: ENT;  Laterality: N/A;  micro direct laryngoscopy with prolaryn injection/jet venturi ventilation    No Known Allergies  Family history and social history have been reviewed.  Review of Systems: Negative for dysphagia. Rarely has heartburn  The remainder of the 10 point ROS is negative except as outlined in the H&P  Physical Exam: General Appearance patient is never wheelchair in no distress Eyes  Non icteric he has a raspy voice HEENT  Non traumatic, normocephalic  Mouth No lesion, tongue papillated, no cheilosis Neck Supple without adenopathy, thyroid not enlarged, no carotid bruits, no JVD Lungs Clear to auscultation bilaterally COR Normal S1, normal S2,  regular rhythm, no murmur, quiet precordium Abdomen protuberant. Soft. Normoactive bowel sounds no tenderness Rectal not done Extremities  No pedal edema. Stasis dermatitis lower extremities. Severe deformities secondary to rheumatoid arthritis Skin No lesions Neurological Alert and oriented x 3 Psychological Normal mood and affect  Assessment and Plan:   64 year old white male with severe rheumatoid arthritis and severe gastroesophageal reflux currently doing well on strict antireflux measures and pantoprazole 40 mg twice a day. We will refill his medications for 90 days months with 3 refills.He will continue  strict antireflux measures. He may take Gaviscon 1 or 2 when necessary breakthrough symptoms  Severe respiratory insufficiency. Oxygen-dependent. Patient is not a candidate for repeat upper endoscopy and biopsies  Return office visit in one year    [name] 01/13/2015

## 2015-01-13 NOTE — Patient Instructions (Addendum)
We have sent in your Protonix to your pharmacy Dr Drue Novel, Dr Craige Cotta

## 2015-01-18 ENCOUNTER — Ambulatory Visit: Payer: BC Managed Care – PPO | Admitting: Pulmonary Disease

## 2015-01-20 ENCOUNTER — Ambulatory Visit (INDEPENDENT_AMBULATORY_CARE_PROVIDER_SITE_OTHER): Payer: BC Managed Care – PPO | Admitting: Adult Health

## 2015-01-20 ENCOUNTER — Encounter: Payer: Self-pay | Admitting: Adult Health

## 2015-01-20 VITALS — BP 120/86 | HR 80 | Temp 97.7°F | Ht 70.0 in | Wt 238.0 lb

## 2015-01-20 DIAGNOSIS — J439 Emphysema, unspecified: Secondary | ICD-10-CM

## 2015-01-20 DIAGNOSIS — J9611 Chronic respiratory failure with hypoxia: Secondary | ICD-10-CM | POA: Diagnosis not present

## 2015-01-20 MED ORDER — ALBUTEROL SULFATE HFA 108 (90 BASE) MCG/ACT IN AERS: 2.0000 | INHALATION_SPRAY | RESPIRATORY_TRACT | Status: DC | PRN

## 2015-01-20 MED ORDER — TIOTROPIUM BROMIDE MONOHYDRATE 18 MCG IN CAPS: ORAL_CAPSULE | RESPIRATORY_TRACT | Status: DC

## 2015-01-20 MED ORDER — FLUTICASONE-SALMETEROL 100-50 MCG/DOSE IN AEPB: 1.0000 | INHALATION_SPRAY | Freq: Two times a day (BID) | RESPIRATORY_TRACT | Status: DC

## 2015-01-20 NOTE — Progress Notes (Signed)
Reviewed and agree with assessment/plan. 

## 2015-01-20 NOTE — Patient Instructions (Addendum)
Continue on Spiriva and Advair .  Continue on Oxygen 3l/m  Great job with pulmonary rehab. Follow up Dr. Craige Cotta  3 months and As needed

## 2015-01-20 NOTE — Assessment & Plan Note (Signed)
Doing very well on his current regimen Pulmonary rehabilitation. Seems to have really helped with his underlying deconditioning.  Plan Continue on Spiriva and Advair .  Continue on Oxygen 3l/m  Great job with pulmonary rehab. Follow up Dr. Craige Cotta  3 months and As needed

## 2015-01-20 NOTE — Progress Notes (Signed)
Subjective:    Patient ID: Joseph [name], male    DOB: [birth year], 64 y.o.   MRN: [id number]  HPI 64 yo male former smoker with COPD/emphysema, rheumatoid arthritis, recurrent pneumonia 2nd to chronic aspiration/reflux, bronchiectasis, chronic respiratory failure on 3 liters oxygen 24/7, and pulmonary nodules.  TESTS: PFT 01/12/09 >> FEV1 3.02 (85%), FEV1% 68, TLC 6.35 (88%), DLCO 45%, no BD CT chest 10/17/12 >> severe centrilobular emphysema, apical bullae, 1 cm nodule LUL new, 5 mm nodule RLL stable since 2009, BTX with GGO at bases PFT 12/02/12 >> FEV1 3.11 (78%), FEV1% 77, TLC 6.32 (83%), DLCO 37%, no BD CT chest 05/06/13 >> decreased size LUL nodule now 8 mm, 4 mm nodule RLL, 3 mm nodule RML, 7 mm nodule RML, 8 mm nodule lingula CT chest 04/05/14 >> atherosclerosis, moderate diffuse centrilobular and paraseptal emphysema, stable pulmonary nodules with dominant LUL nodule now 7 mm, steatosis of liver with ?early cirrhosis  PMHx >> RA, Psoriasis, HTN, CAD s/p CABG, GERD, HH, Gastroparesis, DM, Peripheral neuropathy  PSHx, Medications, Allergies, Fhx, Shx reviewed.   11/07/14 Follow up :  Pt returns for 2 week follow up  Seen last ov with AECOPD , tx w/ zpack and pred taper.  He is feeling better . Cough and wheezing are better.  Does have DOE , has to stop and rest-this is some better.  Requests referral to pulm rehab.  Also wants evaluation for portable concentrator .  No chest pain, orthopnea, edema or fever.  >refer to [facility] pulm rehab  01/20/2015 Follow up COPD /O2 dependent  Pt returns for 2 month follow up .  Doing very well, is feeling much better.  Says this is the best he has felt in long time Has been doing pulmonary rehab at [facility]  Feels this has helped him so much .  Less DOE. Has lost almost 20lbs.  Less swelling.  Remains on spiriva and advair.  On Oxygen 3l/m . Denies any chest pain, orthopnea, PND, increased leg swelling, fever. Patient is followed  by rheumatology for his rheumatoid arthritis and remains on methotrexate and prednisone 7 mg daily   Review of Systems  Constitutional:   No  weight loss, night sweats,  Fevers, chills,  +fatigue, or  lassitude.  HEENT:   No headaches,  Difficulty swallowing,  Tooth/dental problems, or  Sore throat,                No sneezing, itching, ear ache, nasal congestion, post nasal drip,   CV:  No chest pain,  Orthopnea, PND, swelling in lower extremities, anasarca, dizziness, palpitations, syncope.   GI  No heartburn, indigestion, abdominal pain, nausea, vomiting, diarrhea, change in bowel habits, loss of appetite, bloody stools.   Resp:  .  No chest wall deformity  Skin: no rash or lesions.  GU: no dysuria, change in color of urine, no urgency or frequency.  No flank pain, no hematuria   MS:  No joint pain or swelling.  No decreased range of motion.  No back pain.  Psych:  No change in mood or affect. No depression or anxiety.  No memory loss.          Objective:   Physical Exam GEN: A/Ox3; pleasant , NAD chronically ill appearing   HEENT:  [place]/AT,  EACs-clear, TMs-wnl, NOSE-clear, THROAT-clear, no lesions, no postnasal drip or exudate noted.   NECK:  Supple w/ fair ROM; no JVD; normal carotid impulses w/o bruits; no thyromegaly  or nodules palpated; no lymphadenopathy.  RESP  Decreased BS in bases no , wheezes/ rales/ or rhonchi.no accessory muscle use, no dullness to percussion  CARD:  RRR, no m/r/g  , no peripheral edema, pulses intact, no cyanosis or clubbing.  GI:   Soft & nt; nml bowel sounds; no organomegaly or masses detected.  Musco: Warm bil, no deformities or joint swelling noted.  Arthritic changes of the hands.  Neuro: alert, no focal deficits noted.    Skin: Warm, no lesions or rashes         Assessment & Plan:

## 2015-01-20 NOTE — Assessment & Plan Note (Signed)
Compensated on oxygen  Plan  Continue on Spiriva and Advair .  Continue on Oxygen 3l/m  Great job with pulmonary rehab. Follow up Dr. Craige Cotta  3 months and As needed

## 2015-02-09 ENCOUNTER — Other Ambulatory Visit (INDEPENDENT_AMBULATORY_CARE_PROVIDER_SITE_OTHER): Payer: BC Managed Care – PPO

## 2015-02-09 DIAGNOSIS — M069 Rheumatoid arthritis, unspecified: Secondary | ICD-10-CM | POA: Diagnosis not present

## 2015-02-10 LAB — CBC WITH DIFFERENTIAL/PLATELET
Basophils Absolute: 0 10*3/uL (ref 0.0–0.1)
Basophils Relative: 0.1 % (ref 0.0–3.0)
EOS ABS: 0.1 10*3/uL (ref 0.0–0.7)
EOS PCT: 1 % (ref 0.0–5.0)
HEMATOCRIT: 41.8 % (ref 39.0–52.0)
Hemoglobin: 14.3 g/dL (ref 13.0–17.0)
LYMPHS ABS: 0.8 10*3/uL (ref 0.7–4.0)
Lymphocytes Relative: 10.7 % — ABNORMAL LOW (ref 12.0–46.0)
MCHC: 34.2 g/dL (ref 30.0–36.0)
MCV: 95.2 fl (ref 78.0–100.0)
MONO ABS: 0.6 10*3/uL (ref 0.1–1.0)
Monocytes Relative: 8.8 % (ref 3.0–12.0)
NEUTROS PCT: 79.4 % — AB (ref 43.0–77.0)
Neutro Abs: 5.6 10*3/uL (ref 1.4–7.7)
PLATELETS: 240 10*3/uL (ref 150.0–400.0)
RBC: 4.4 Mil/uL (ref 4.22–5.81)
RDW: 15.8 % — ABNORMAL HIGH (ref 11.5–15.5)
WBC: 7 10*3/uL (ref 4.0–10.5)

## 2015-02-10 LAB — ALT: ALT: 29 U/L (ref 0–53)

## 2015-02-10 LAB — RHEUMATOID FACTOR: Rhuematoid fact SerPl-aCnc: 104 IU/mL — ABNORMAL HIGH (ref <=14)

## 2015-02-10 LAB — AST: AST: 26 U/L (ref 0–37)

## 2015-02-10 LAB — CK: Total CK: 94 U/L (ref 7–232)

## 2015-02-10 LAB — ALBUMIN: Albumin: 4.1 g/dL (ref 3.5–5.2)

## 2015-02-13 ENCOUNTER — Other Ambulatory Visit: Payer: Self-pay | Admitting: Internal Medicine

## 2015-02-16 ENCOUNTER — Other Ambulatory Visit: Payer: Self-pay

## 2015-02-16 MED ORDER — ONETOUCH ULTRASOFT LANCETS MISC: Status: DC

## 2015-02-28 ENCOUNTER — Telehealth: Payer: Self-pay | Admitting: Adult Health

## 2015-02-28 DIAGNOSIS — J439 Emphysema, unspecified: Secondary | ICD-10-CM

## 2015-02-28 NOTE — Telephone Encounter (Signed)
Pt recently completed Pulm Rehab at [facility] and is wanting to continue on to the next program.  Pt states that the name of this program is [employer] and he needs a Rx to be enrolled.  Should be covered by his insurance - [employer] Please advise Dr Craige Cotta if this order can be placed. Thanks.

## 2015-02-28 NOTE — Telephone Encounter (Signed)
Okay to send order. 

## 2015-02-28 NOTE — Telephone Encounter (Signed)
Pt would like this mailed to him instead and if you have any other questions he can be reached @ [phone].Caren Griffins

## 2015-03-01 ENCOUNTER — Telehealth: Payer: Self-pay

## 2015-03-01 NOTE — Telephone Encounter (Signed)
Spoke with Efraim Kaufmann, RN she informed me that fax was just information for our records. She informed me that Pt bought his own unit out-of-pocket and his current unit does not go up to 3 Liters continuous, she stated they did recommend that Pt gets a new unit. Informed her that I have not heard from Pt regarding this or if he wants to get a new unit. Informed her that if I did not hear from Pt in 1-2 days I will call him. [name] verbalized understanding.

## 2015-03-01 NOTE — Telephone Encounter (Signed)
Order placed and nothing further needed 

## 2015-03-01 NOTE — Telephone Encounter (Signed)
Received fax on 02/22/2015 from [facility] requesting portable system change. Per notes from them "During six minute walk test, Joseph [name] Sp02 dropped to 86% on 3 pd liter flow from his personal POC unit. He routinely requires a 3 continuous l/min flow to maintain Sp02 above 88 % when walking or with seated exercise." Per chart, Dr. Craige Cotta started 3 liter continuous flow 24/7 back in 04/2013. Called [facility] and informed them of this, was informed that they would look into chart and return call.

## 2015-03-20 ENCOUNTER — Other Ambulatory Visit: Payer: Self-pay | Admitting: Adult Health

## 2015-03-24 ENCOUNTER — Telehealth: Payer: Self-pay | Admitting: Pulmonary Disease

## 2015-03-24 NOTE — Telephone Encounter (Signed)
Please advise P[facility];s thanks 

## 2015-03-27 NOTE — Telephone Encounter (Signed)
Unable to reach ct so i called pt and he was given the # to ct to call and change his appt Joseph [name]

## 2015-04-10 ENCOUNTER — Telehealth: Payer: Self-pay | Admitting: Internal Medicine

## 2015-04-10 ENCOUNTER — Inpatient Hospital Stay: Admission: RE | Admit: 2015-04-10 | Payer: BC Managed Care – PPO | Source: Ambulatory Visit

## 2015-04-10 DIAGNOSIS — M069 Rheumatoid arthritis, unspecified: Secondary | ICD-10-CM

## 2015-04-10 NOTE — Telephone Encounter (Signed)
Pt coming in 04/11/15 1?45pm with lab orders from Dr. Olean Ree (rhuematologist). He said he has done this before and we sent results to his doctor in [place] for him. Please enter orders as needed.

## 2015-04-11 ENCOUNTER — Other Ambulatory Visit (INDEPENDENT_AMBULATORY_CARE_PROVIDER_SITE_OTHER): Payer: BC Managed Care – PPO

## 2015-04-11 DIAGNOSIS — M069 Rheumatoid arthritis, unspecified: Secondary | ICD-10-CM

## 2015-04-11 LAB — RHEUMATOID FACTOR: RHEUMATOID FACTOR: 103 [IU]/mL — AB (ref <=14)

## 2015-04-11 NOTE — Telephone Encounter (Signed)
Please advise 

## 2015-04-11 NOTE — Telephone Encounter (Signed)
CMP, CBC, total CK -- dx rheumatoid arthritis

## 2015-04-11 NOTE — Telephone Encounter (Signed)
Labs ordered.

## 2015-04-12 LAB — CBC WITH DIFFERENTIAL/PLATELET
BASOS PCT: 0.4 % (ref 0.0–3.0)
Basophils Absolute: 0 10*3/uL (ref 0.0–0.1)
EOS PCT: 1.3 % (ref 0.0–5.0)
Eosinophils Absolute: 0.1 10*3/uL (ref 0.0–0.7)
HEMATOCRIT: 45.1 % (ref 39.0–52.0)
Hemoglobin: 14.9 g/dL (ref 13.0–17.0)
LYMPHS ABS: 0.8 10*3/uL (ref 0.7–4.0)
LYMPHS PCT: 12 % (ref 12.0–46.0)
MCHC: 33.1 g/dL (ref 30.0–36.0)
MCV: 97.4 fl (ref 78.0–100.0)
MONOS PCT: 6.5 % (ref 3.0–12.0)
Monocytes Absolute: 0.4 10*3/uL (ref 0.1–1.0)
NEUTROS ABS: 5.4 10*3/uL (ref 1.4–7.7)
NEUTROS PCT: 79.8 % — AB (ref 43.0–77.0)
PLATELETS: 191 10*3/uL (ref 150.0–400.0)
RBC: 4.63 Mil/uL (ref 4.22–5.81)
RDW: 16.1 % — AB (ref 11.5–15.5)
WBC: 6.7 10*3/uL (ref 4.0–10.5)

## 2015-04-12 LAB — ALT: ALT: 35 U/L (ref 0–53)

## 2015-04-12 LAB — AST: AST: 32 U/L (ref 0–37)

## 2015-04-12 LAB — ALBUMIN: Albumin: 4.1 g/dL (ref 3.5–5.2)

## 2015-04-12 LAB — CREATININE, SERUM: Creatinine, Ser: 0.72 mg/dL (ref 0.40–1.50)

## 2015-04-17 ENCOUNTER — Inpatient Hospital Stay: Admission: RE | Admit: 2015-04-17 | Payer: BC Managed Care – PPO | Source: Ambulatory Visit

## 2015-05-02 ENCOUNTER — Ambulatory Visit (INDEPENDENT_AMBULATORY_CARE_PROVIDER_SITE_OTHER)
Admission: RE | Admit: 2015-05-02 | Discharge: 2015-05-02 | Disposition: A | Discharge status: Home / Self Care | Payer: BC Managed Care – PPO | Source: Ambulatory Visit | Attending: Pulmonary Disease | Admitting: Pulmonary Disease

## 2015-05-02 DIAGNOSIS — R911 Solitary pulmonary nodule: Secondary | ICD-10-CM

## 2015-05-03 ENCOUNTER — Telehealth: Payer: Self-pay | Admitting: Pulmonary Disease

## 2015-05-03 NOTE — Telephone Encounter (Signed)
CT chest 05/02/15 >> stable pulmonary nodules, severe emphysema  Will have my nurse inform pt that CT chest did not show an changes in pulmonary nodules.  He will need next available ROV to discuss in more detail and to assess status of COPD/emphysema.

## 2015-05-04 NOTE — Telephone Encounter (Signed)
LVM for pt to return call

## 2015-05-05 NOTE — Telephone Encounter (Signed)
lmomtcb x1 

## 2015-05-05 NOTE — Telephone Encounter (Signed)
Called, spoke wit pt.  Discussed below results and recs per Dr. Craige Cotta.  Pt verbalized understanding, is in agreement with plan, and voiced no further questions or concerns at this time.  Pt states he is currently driving and requesting to call back on Monday to schedule OV.  Will hold msg open to ensure follow up.

## 2015-05-05 NOTE — Telephone Encounter (Signed)
Patient returned call.  I lost him before I could get his number.

## 2015-05-09 NOTE — Telephone Encounter (Signed)
Patient notified.  No questions or concerns at this time. Nothing further needed.   

## 2015-05-15 ENCOUNTER — Encounter: Payer: Self-pay | Admitting: Behavioral Health

## 2015-05-15 ENCOUNTER — Telehealth: Payer: Self-pay | Admitting: Behavioral Health

## 2015-05-15 NOTE — Telephone Encounter (Signed)
Pre-Visit Call completed with patient and chart updated.   Pre-Visit Info documented in Specialty Comments under [employer].    

## 2015-05-16 ENCOUNTER — Encounter: Payer: Self-pay | Admitting: Internal Medicine

## 2015-05-16 ENCOUNTER — Ambulatory Visit (INDEPENDENT_AMBULATORY_CARE_PROVIDER_SITE_OTHER): Payer: BC Managed Care – PPO | Admitting: Internal Medicine

## 2015-05-16 ENCOUNTER — Other Ambulatory Visit: Payer: Self-pay

## 2015-05-16 VITALS — BP 124/78 | HR 78 | Temp 97.8°F | Ht 70.0 in | Wt 229.5 lb

## 2015-05-16 DIAGNOSIS — Z1211 Encounter for screening for malignant neoplasm of colon: Secondary | ICD-10-CM

## 2015-05-16 DIAGNOSIS — Z125 Encounter for screening for malignant neoplasm of prostate: Secondary | ICD-10-CM

## 2015-05-16 DIAGNOSIS — Z1159 Encounter for screening for other viral diseases: Secondary | ICD-10-CM

## 2015-05-16 DIAGNOSIS — Z23 Encounter for immunization: Secondary | ICD-10-CM | POA: Diagnosis not present

## 2015-05-16 DIAGNOSIS — Z09 Encounter for follow-up examination after completed treatment for conditions other than malignant neoplasm: Secondary | ICD-10-CM | POA: Insufficient documentation

## 2015-05-16 DIAGNOSIS — Z114 Encounter for screening for human immunodeficiency virus [HIV]: Secondary | ICD-10-CM

## 2015-05-16 DIAGNOSIS — Z0000 Encounter for general adult medical examination without abnormal findings: Secondary | ICD-10-CM | POA: Diagnosis not present

## 2015-05-16 DIAGNOSIS — E119 Type 2 diabetes mellitus without complications: Secondary | ICD-10-CM

## 2015-05-16 LAB — HM DIABETES FOOT EXAM

## 2015-05-16 NOTE — Assessment & Plan Note (Signed)
Labs -- FLP, A1c, microalbumin, PSA, hep C, HIV Diabetes: Due for a A1c and microalbumin. He has severe neuropathy, he is well aware of feet care precautions . Hypertension: Recent BMP satisfactory, continue with present care  Neuropathy Recently improve without apparent explanation, not taking Lyrica, symptoms well controlled on Elavil

## 2015-05-16 NOTE — Patient Instructions (Signed)
   Please schedule labs to be done within few days (fasting)    Next visit  for a routine checkup in 6 months, no fasting      (30 minutes) Please schedule an appointment at the front desk

## 2015-05-16 NOTE — Progress Notes (Signed)
Subjective:    Patient ID: Joseph [name], male    DOB: 11/11/1950, 64 y.o.   MRN: [id number]  DOS:  05/16/2015 Type of visit - description : cpx Interval history: In general feeling well, "I feel better this year than in long time"   Review of Systems Constitutional: No fever. No chills. No unexplained wt changes. No unusual sweats  HEENT: No dental problems, no ear discharge, no facial swelling, no voice changes. No eye discharge, no eye  redness , no  intolerance to light   Respiratory: Breathing symptoms at baseline  Cardiovascular: No CP, no leg swelling , no  Palpitations  GI: no nausea, no vomiting, no diarrhea , no  abdominal pain.  No blood in the stools. No dysphagia, no odynophagia    Endocrine: No polyphagia, no polyuria , no polydipsia  GU: No dysuria, gross hematuria, difficulty urinating. No urinary urgency, no frequency.  Musculoskeletal: Joint pains at baseline, actually neuropathy pain has decreased significantly  Skin: No change in the color of the skin, palor , no  Rash  Allergic, immunologic: No environmental allergies , no  food allergies  Neurological: No dizziness no  syncope. No headaches. No diplopia, no slurred, no slurred speech, no motor deficits, no facial  Numbness  Hematological: No enlarged lymph nodes, no easy bruising , no unusual bleedings  Psychiatry: No suicidal ideas, no hallucinations, no beavior problems, no confusion.  No unusual/severe anxiety, no depression   Past Medical History  Diagnosis Date  . Rheumatoid arthritis(714.0)   . Psoriasis   . Hypertension   . Gastroesophageal reflux disease   . COPD (chronic obstructive pulmonary disease)   . CAD (coronary artery disease)     MI 2007, CABG  . Osteoporosis     per DEXA 05-2008  . Peripheral polyneuropathy     Severe, causing problems with his feet, see surgeries  . Diabetes mellitus     Dx 07-2008 A1C 6.2  . Onychomycosis     toenails  . Hiatal hernia   . Esophageal  dysmotility   . Gastroparesis   . Fatty liver 10/11/09  . Pulmonary fibrosis   . Neuropathy     (related to RA per neuro note 05-2011)  . Oxygen dependent     3l  . CHF (congestive heart failure)     pt stated he doesnt have this  . Complication of anesthesia     DIFFICULTY AFTER CABG HAD CONFUSION POST OP  . Myocardial infarction 2007  . Shortness of breath   . Cataracts, bilateral   . Rheumatoid arthritis     [facility] Rheumatology    Past Surgical History  Procedure Laterality Date  . Foot surgery      to tendon release and repair of osteomyelitis   . Cholecystectomy    . Coronary artery bypass graft  2007    (LIMA to the LAD, left radial to obtuse marginal, SVG to first diagonal, SVG to PDA. His ejection fraction to 50-55%  . Achiles tendon surgery  8/09  . Toe amputation  4-12/ 3 14    d/t a deformity, found to have osteomyelitis, complicated by post-op foot strtess FX  . Esophagogastroduodenoscopy N/A 10/19/2012    Procedure: ESOPHAGOGASTRODUODENOSCOPY (EGD);  Surgeon: Hart Carwin, MD;  Location: [facility] ENDOSCOPY;  Service: Endoscopy;  Laterality: N/A;  the dilatation is possible.   . Laryngoscopy  01/21/2014     DR BATES   . Laryngoscopy N/A 01/21/2014    Procedure: LARYNGOSCOPY;  Surgeon: Christia Reading, MD;  Location: [facility] OR;  Service: ENT;  Laterality: N/A;  micro direct laryngoscopy with prolaryn injection/jet venturi ventilation    Social History   Social History  . Marital Status: Married    Spouse Name: N/A  . Number of Children: 0  . Years of Education: N/A   Occupational History  . disabled-retired    Social History Main Topics  . Smoking status: Former Smoker -- 1.00 packs/day for 40 years    Quit date: 08/26/2005  . Smokeless tobacco: Former Neurosurgeon    Quit date: 04/02/2006     Comment: smoked x 40 years, 1 ppd  . Alcohol Use: No  . Drug Use: No  . Sexual Activity: Not on file   Other Topics Concern  . Not on file   Social History Narrative   Family  History  Problem Relation Age of Onset  . Heart disease Mother   . Diverticulitis Mother   . Heart disease Father   . Heart attack Father     x2  . Colon cancer Neg Hx   . Prostate cancer Neg Hx         Medication List       This list is accurate as of: 05/16/15  6:04 PM.  Always use your most recent med list.               ACAPELLA Misc  Use as directed     albuterol 108 (90 BASE) MCG/ACT inhaler  Commonly known as:  PROVENTIL HFA;VENTOLIN HFA  Inhale 2 puffs into the lungs every 4 (four) hours as needed for wheezing or shortness of breath.     amitriptyline 75 MG tablet  Commonly known as:  ELAVIL  Take 2 tablets at 4pm and at bedtime     aspirin 81 MG tablet  Take 81 mg by mouth daily.     atorvastatin 80 MG tablet  Commonly known as:  LIPITOR  TAKE 1 TABLET BY MOUTH DAILY     B-D TB SYRINGE 1CC/27GX1/2" 27G X 1/2" 1 ML Misc  Generic drug:  TUBERCULIN SYR 1CC/27GX1/2"  Use as directed.     Fluticasone-Salmeterol 100-50 MCG/DOSE Aepb  Commonly known as:  ADVAIR DISKUS  Inhale 1 puff into the lungs 2 (two) times daily.     FLUTTER Devi  Blow through several times, 2 or 3 sets per day as needed     folic acid 800 MCG tablet  Commonly known as:  FOLVITE  Take 800 mcg by mouth daily.     furosemide 20 MG tablet  Commonly known as:  LASIX  Take 60 mg by mouth daily. 2 in AM, 1 at 4 PM     glucose blood test strip  Commonly known as:  ONE TOUCH ULTRA TEST  Test blood sugar no more than twice daily.     LOTEMAX 0.5 % Gel  Generic drug:  Loteprednol Etabonate  daily     methotrexate (PF) 50 MG/2ML injection  Inject 25 mg into the skin. Once a week     metoprolol tartrate 25 MG tablet  Commonly known as:  LOPRESSOR  Take 1 tablet (25 mg total) by mouth 2 (two) times daily.     nitroGLYCERIN 0.4 MG SL tablet  Commonly known as:  NITROSTAT  Place 1 tablet (0.4 mg total) under the tongue every 5 (five) minutes as needed for chest pain.     NON  FORMULARY  EVERGO PORTABLE PULSE DOSE OXYGEN oxygen  5L per nasal cannula 24 hours a day Dx: 496     onetouch ultrasoft lancets  Take blood sugar no more than twice daily.     oxyCODONE-acetaminophen 7.5-325 MG per tablet  Commonly known as:  PERCOCET  Take 1 tablet by mouth 4 (four) times daily. 3 times a day     pantoprazole 40 MG tablet  Commonly known as:  PROTONIX  Take 1 tablet (40 mg total) by mouth 2 (two) times daily.     PERFECT IRON PO  Take 1 tablet by mouth every morning.     potassium chloride SA 20 MEQ tablet  Commonly known as:  K-DUR,KLOR-CON  Take 60 mEq by mouth once. 2 tabs  in the AM, 1 tab  PM     predniSONE 5 MG tablet  Commonly known as:  DELTASONE  Take 7 mg by mouth daily.     SPIRIVA HANDIHALER 18 MCG inhalation capsule  Generic drug:  tiotropium  INHALE 1 CAPSULE BY MOUTH IN THE HANDIHALER DAILY     UNABLE TO FIND  Med Name: Incentive Spirometer Use as directed     vitamin B-12 250 MCG tablet  Commonly known as:  CYANOCOBALAMIN  Take 250 mcg by mouth daily.     vitamin C 500 MG tablet  Commonly known as:  ASCORBIC ACID  Take 500 mg by mouth daily.           Objective:   Physical Exam BP 124/78 mmHg  Pulse 78  Temp(Src) 97.8 F (36.6 C) (Oral)  Ht 5\' 10"  (1.778 m)  Wt 229 lb 8 oz (104.101 kg)  BMI 32.93 kg/m2  SpO2 96% General:   Well developed, well nourished . NAD.  HEENT:  Normocephalic . Face symmetric, atraumatic Lungs:  Decreased breath sounds Normal respiratory effort, no intercostal retractions, no accessory muscle use. Heart: RRR,  no murmur.  no pretibial edema bilaterally  Abdomen:  Not distended, soft, non-tender. No rebound or rigidity. N Rectal:  External abnormalities: none. Normal sphincter tone. No rectal masses or tenderness.  No stools found  Prostate: Prostate gland firm and smooth, no enlargement, nodularity, tenderness, mass, asymmetry or induration.  Diabetic foot exam: Severe deformities, status  post surgery, good pedal pulses, no sensitivity to pinprick examination. Neurologic:  alert & oriented X3.  Speech normal, gait appropriate for age and unassisted Psych--  Cognition and judgment appear intact.  Cooperative with normal attention span and concentration.  Behavior appropriate. No anxious or depressed appearing.    Assessment & Plan:   Problem list > MSK: --RA --- Dr --Psoriasis --Osteoporosis Peripheral neuropathy, severe, several feet surgeries DM HTN Hyperlipidemia Pulmonary: --COPD --Pulmonary fibrosis --O2 24/ 7 --Chronic respiratory failure CAD, CHF, MI and  CABG 2007 GI --Barrettts --GERD --esophageal dysmotility --Fatty liver Hoarseness , chronic, saw ENT , s/p botox 2015 , helped tempoarrily   A/P Labs -- FLP, A1c, microalbumin, PSA, hep C, HIV Diabetes: Due for a A1c and microalbumin. He has severe neuropathy, he is well aware of feet care precautions . Hypertension: Recent BMP satisfactory, continue with present care  Neuropathy Recently improve without apparent explanation, not taking Lyrica, symptoms well controlled on Elavil

## 2015-05-16 NOTE — Assessment & Plan Note (Signed)
TD 2013; PNM 23 ---2007, 2012';prevnar 2015 ;Flu shot today No zostavax d/t immunosupression CCS: Never had a colonoscopy ,pt reluctant to proceed, he is high risk for procedures ; eventually we agreed on a IFOB  Prostate cancer screening:  DRE wnl, check a PSA Labs

## 2015-05-16 NOTE — Progress Notes (Signed)
Pre visit review using our clinic review tool, if applicable. No additional management support is needed unless otherwise documented below in the visit note. 

## 2015-05-18 ENCOUNTER — Ambulatory Visit (INDEPENDENT_AMBULATORY_CARE_PROVIDER_SITE_OTHER): Payer: BC Managed Care – PPO | Admitting: Pulmonary Disease

## 2015-05-18 ENCOUNTER — Encounter: Payer: Self-pay | Admitting: Pulmonary Disease

## 2015-05-18 VITALS — BP 118/74 | HR 74 | Ht 72.0 in | Wt 222.6 lb

## 2015-05-18 DIAGNOSIS — J9611 Chronic respiratory failure with hypoxia: Secondary | ICD-10-CM

## 2015-05-18 DIAGNOSIS — J432 Centrilobular emphysema: Secondary | ICD-10-CM

## 2015-05-18 DIAGNOSIS — R918 Other nonspecific abnormal finding of lung field: Secondary | ICD-10-CM

## 2015-05-18 DIAGNOSIS — J439 Emphysema, unspecified: Secondary | ICD-10-CM | POA: Diagnosis not present

## 2015-05-18 DIAGNOSIS — J479 Bronchiectasis, uncomplicated: Secondary | ICD-10-CM

## 2015-05-18 MED ORDER — FLUTTER DEVI: 1.0000 | Freq: Two times a day (BID) | Status: DC

## 2015-05-18 NOTE — Patient Instructions (Signed)
Follow up in 6 months 

## 2015-05-18 NOTE — Progress Notes (Signed)
Chief Complaint  Patient presents with  . Follow-up    pt states he is doing pretty good. completed pulmonary rehab at [facility]. pt states he joined the fitness center at [facility]. pt on continuous O2  3LPN at home. pt on O2 3.5LPM pulse when out.      CC: [name]  History of Present Illness: ICHAEL [name] is a 64 y.o. male former smoker with COPD/emphysema, rheumatoid arthritis, recurrent pneumonia 2nd to chronic aspiration/reflux, bronchiectasis, chronic respiratory failure on 3 liters oxygen 24/7, and pulmonary nodules.  He has been doing much better.  He has been working with pulmonary rehab, and this has helped a lot.  He has lost about 35 lbs.  He is getting around better, and does not have as much pain with activity.  He still gets cough with clear sputum, but less.  He denies wheeze, fever, or hemoptysis.  He uses 3 liters pulsed oxygen, but changes to continuous flow when exercising.  He does not need to use albuterol much.  His rheumatologist is weaning his prednisone.  He injects himself with MTX.  He got flu shot earlier this week.  TESTS: PFT 01/12/09 >> FEV1 3.02 (85%), FEV1% 68, TLC 6.35 (88%), DLCO 45%, no BD CT chest 10/17/12 >> severe centrilobular emphysema, apical bullae, 1 cm nodule LUL new, 5 mm nodule RLL stable since 2009, BTX with GGO at bases PFT 12/02/12 >> FEV1 3.11 (78%), FEV1% 77, TLC 6.32 (83%), DLCO 37%, no BD CT chest 05/06/13 >> decreased size LUL nodule now 8 mm, 4 mm nodule RLL, 3 mm nodule RML, 7 mm nodule RML, 8 mm nodule lingula CT chest 04/05/14 >> atherosclerosis, moderate diffuse centrilobular and paraseptal emphysema, stable pulmonary nodules with dominant LUL nodule now 7 mm, steatosis of liver with ?early cirrhosis CT chest 05/02/15 >> stable pulmonary nodules since 2009, severe emphysema   PMHx >> RA, Psoriasis, HTN, CAD s/p CABG, GERD, HH, Gastroparesis, DM, Peripheral neuropathy  PSHx, Medications, Allergies, Fhx,  Shx reviewed.   Physical Exam: BP 118/74 mmHg  Pulse 74  Ht 6' (1.829 m)  Wt 222 lb 9.6 oz (100.971 kg)  BMI 30.18 kg/m2  SpO2 95%  General - No distress, wearing oxygen ENT - No sinus tenderness, no oral exudate, no LAN Cardiac - s1s2 regular, no murmur Chest - no wheeze Back - No focal tenderness Abd - Soft, non-tender Ext - deformities of rheumatoid arthritis Neuro - Normal strength Skin - No rashes Psych - normal mood, and behavior   Assessment/Plan:  COPD/emphysema. Much improved. Plan: - continue spiriva, advair, and prn albuterol - continue monitored exercise program  Recurrent aspiration PNA with hx of GERD, HH, and gastroparesis. Plan: - continue bid protonix per GI  Bronchiectasis. Plan: - continue mucinex, flutter valve  Chronic respiratory failure. Related to COPD/Emphysema. Plan: - continue 3 liters oxygen 24/7  Pulmonary nodules. Stable since 2009. Plan: - no need for additional radiographic follow up  Hx of Rheumatoid arthritis. Explained how some of his lung disease could be related to rheumatoid arthritis. Plan: - he is on MTX, and chronic prednisone per rheumatology    [name], MD [facility] Pager:  [phone]

## 2015-06-21 ENCOUNTER — Other Ambulatory Visit: Payer: Self-pay | Admitting: *Deleted

## 2015-06-21 MED ORDER — METOPROLOL TARTRATE 25 MG PO TABS: 25.0000 mg | ORAL_TABLET | Freq: Two times a day (BID) | ORAL | Status: DC

## 2015-06-23 ENCOUNTER — Other Ambulatory Visit: Payer: Self-pay | Admitting: Cardiology

## 2015-06-24 ENCOUNTER — Other Ambulatory Visit: Payer: Self-pay | Admitting: Adult Health

## 2015-07-04 ENCOUNTER — Encounter: Payer: Self-pay | Admitting: Cardiology

## 2015-07-07 ENCOUNTER — Other Ambulatory Visit: Payer: Self-pay | Admitting: *Deleted

## 2015-07-07 MED ORDER — POTASSIUM CHLORIDE CRYS ER 20 MEQ PO TBCR: EXTENDED_RELEASE_TABLET | ORAL | Status: DC

## 2015-07-10 ENCOUNTER — Ambulatory Visit: Payer: BC Managed Care – PPO | Admitting: Cardiology

## 2015-07-11 ENCOUNTER — Ambulatory Visit (INDEPENDENT_AMBULATORY_CARE_PROVIDER_SITE_OTHER): Payer: BC Managed Care – PPO | Admitting: Internal Medicine

## 2015-07-11 ENCOUNTER — Other Ambulatory Visit: Payer: Self-pay

## 2015-07-11 ENCOUNTER — Encounter: Payer: Self-pay | Admitting: Internal Medicine

## 2015-07-11 VITALS — BP 128/76 | HR 72 | Temp 98.0°F | Ht 72.0 in | Wt 229.5 lb

## 2015-07-11 DIAGNOSIS — Z09 Encounter for follow-up examination after completed treatment for conditions other than malignant neoplasm: Secondary | ICD-10-CM

## 2015-07-11 DIAGNOSIS — M069 Rheumatoid arthritis, unspecified: Secondary | ICD-10-CM

## 2015-07-11 DIAGNOSIS — J438 Other emphysema: Secondary | ICD-10-CM | POA: Diagnosis not present

## 2015-07-11 DIAGNOSIS — Z125 Encounter for screening for malignant neoplasm of prostate: Secondary | ICD-10-CM

## 2015-07-11 DIAGNOSIS — Z114 Encounter for screening for human immunodeficiency virus [HIV]: Secondary | ICD-10-CM

## 2015-07-11 DIAGNOSIS — E119 Type 2 diabetes mellitus without complications: Secondary | ICD-10-CM

## 2015-07-11 DIAGNOSIS — I809 Phlebitis and thrombophlebitis of unspecified site: Secondary | ICD-10-CM | POA: Diagnosis not present

## 2015-07-11 DIAGNOSIS — Z0000 Encounter for general adult medical examination without abnormal findings: Secondary | ICD-10-CM | POA: Diagnosis not present

## 2015-07-11 DIAGNOSIS — Z1159 Encounter for screening for other viral diseases: Secondary | ICD-10-CM

## 2015-07-11 DIAGNOSIS — M7989 Other specified soft tissue disorders: Secondary | ICD-10-CM

## 2015-07-11 LAB — CBC WITH DIFFERENTIAL/PLATELET
Basophils Absolute: 0 10*3/uL (ref 0.0–0.1)
Basophils Relative: 0.3 % (ref 0.0–3.0)
Eosinophils Absolute: 0.1 10*3/uL (ref 0.0–0.7)
Eosinophils Relative: 1.7 % (ref 0.0–5.0)
HEMATOCRIT: 42.8 % (ref 39.0–52.0)
Hemoglobin: 14.4 g/dL (ref 13.0–17.0)
LYMPHS ABS: 0.5 10*3/uL — AB (ref 0.7–4.0)
LYMPHS PCT: 9.2 % — AB (ref 12.0–46.0)
MCHC: 33.6 g/dL (ref 30.0–36.0)
MCV: 96.2 fl (ref 78.0–100.0)
MONOS PCT: 2.5 % — AB (ref 3.0–12.0)
Monocytes Absolute: 0.1 10*3/uL (ref 0.1–1.0)
NEUTROS ABS: 5 10*3/uL (ref 1.4–7.7)
PLATELETS: 167 10*3/uL (ref 150.0–400.0)
RBC: 4.44 Mil/uL (ref 4.22–5.81)
RDW: 16.3 % — ABNORMAL HIGH (ref 11.5–15.5)
WBC: 5.8 10*3/uL (ref 4.0–10.5)

## 2015-07-11 LAB — MICROALBUMIN / CREATININE URINE RATIO
Creatinine,U: 53.8 mg/dL
MICROALB/CREAT RATIO: 1.3 mg/g (ref 0.0–30.0)
Microalb, Ur: <0.7 mg/dL (ref 0.0–1.9)

## 2015-07-11 LAB — COMPREHENSIVE METABOLIC PANEL
ALK PHOS: 70 U/L (ref 39–117)
ALT: 37 U/L (ref 0–53)
AST: 24 U/L (ref 0–37)
Albumin: 4.1 g/dL (ref 3.5–5.2)
BILIRUBIN TOTAL: 0.9 mg/dL (ref 0.2–1.2)
BUN: 11 mg/dL (ref 6–23)
CALCIUM: 9.1 mg/dL (ref 8.4–10.5)
CO2: 25 mEq/L (ref 19–32)
Chloride: 105 mEq/L (ref 96–112)
Creatinine, Ser: 0.68 mg/dL (ref 0.40–1.50)
GFR: 125 mL/min (ref >60.00)
GLUCOSE: 247 mg/dL — AB (ref 70–99)
Potassium: 3.8 mEq/L (ref 3.5–5.1)
Sodium: 140 mEq/L (ref 135–145)
TOTAL PROTEIN: 6.5 g/dL (ref 6.0–8.3)

## 2015-07-11 LAB — PSA: PSA: 0.21 ng/mL (ref 0.10–4.00)

## 2015-07-11 LAB — LIPID PANEL
CHOL/HDL RATIO: 4
Cholesterol: 136 mg/dL (ref 0–200)
HDL: 35.5 mg/dL — ABNORMAL LOW (ref >39.00)
LDL Cholesterol: 71 mg/dL (ref 0–99)
NONHDL: 100.01
Triglycerides: 146 mg/dL (ref 0.0–149.0)
VLDL: 29.2 mg/dL (ref 0.0–40.0)

## 2015-07-11 LAB — CK TOTAL AND CKMB (NOT AT ARMC): CK TOTAL: 91 U/L (ref 7–232)

## 2015-07-11 LAB — HEMOGLOBIN A1C: HEMOGLOBIN A1C: 6.2 % (ref 4.6–6.5)

## 2015-07-11 NOTE — Patient Instructions (Addendum)
Get your blood work before you leave   We are scheduling a ultrasound of your right leg to rule out a superficial clot.     Next visit  for a checkup in 4 months   (30 minutes), no fasting Please schedule an appointment at the front desk

## 2015-07-11 NOTE — Progress Notes (Signed)
Subjective:    Patient ID: Joseph [name], male    DOB: 04-19-1951, 64 y.o.   MRN: [id number]  DOS:  07/11/2015 Type of visit - description : Acute Interval history: Developmental area of induration 2 months ago, wonders if it is cellulitis. The area has increased in size very little since. It is mildly tender to palpation. Area  feels warm. No discharge.   Review of Systems No fever chills.  Past Medical History  Diagnosis Date  . Rheumatoid arthritis(714.0)   . Psoriasis   . Hypertension   . Gastroesophageal reflux disease   . COPD (chronic obstructive pulmonary disease) (HCC)   . CAD (coronary artery disease)     MI 2007, CABG  . Osteoporosis     per DEXA 05-2008  . Peripheral polyneuropathy (HCC)     Severe, causing problems with his feet, see surgeries  . Diabetes mellitus     Dx 07-2008 A1C 6.2  . Onychomycosis     toenails  . Hiatal hernia   . Esophageal dysmotility   . Gastroparesis   . Fatty liver 10/11/09  . Pulmonary fibrosis (HCC)   . Neuropathy (HCC)     (related to RA per neuro note 05-2011)  . Oxygen dependent     3l  . CHF (congestive heart failure) (HCC)     pt stated he doesnt have this  . Complication of anesthesia     DIFFICULTY AFTER CABG HAD CONFUSION POST OP  . Myocardial infarction (HCC) 2007  . Shortness of breath   . Cataracts, bilateral   . Rheumatoid arthritis [facility])     [facility] Rheumatology    Past Surgical History  Procedure Laterality Date  . Foot surgery      to tendon release and repair of osteomyelitis   . Cholecystectomy    . Coronary artery bypass graft  2007    (LIMA to the LAD, left radial to obtuse marginal, SVG to first diagonal, SVG to PDA. His ejection fraction to 50-55%  . Achiles tendon surgery  8/09  . Toe amputation  4-12/ 3 14    d/t a deformity, found to have osteomyelitis, complicated by post-op foot strtess FX  . Esophagogastroduodenoscopy N/A 10/19/2012    Procedure: ESOPHAGOGASTRODUODENOSCOPY (EGD);   Surgeon: Hart Carwin, MD;  Location: [facility] ENDOSCOPY;  Service: Endoscopy;  Laterality: N/A;  the dilatation is possible.   . Laryngoscopy  01/21/2014     DR BATES   . Laryngoscopy N/A 01/21/2014    Procedure: LARYNGOSCOPY;  Surgeon: Christia Reading, MD;  Location: [facility] OR;  Service: ENT;  Laterality: N/A;  micro direct laryngoscopy with prolaryn injection/jet venturi ventilation    Social History   Social History  . Marital Status: Married    Spouse Name: N/A  . Number of Children: 0  . Years of Education: N/A   Occupational History  . disabled-retired    Social History Main Topics  . Smoking status: Former Smoker -- 1.00 packs/day for 40 years    Quit date: 08/26/2005  . Smokeless tobacco: Former Neurosurgeon    Quit date: 04/02/2006     Comment: smoked x 40 years, 1 ppd  . Alcohol Use: No  . Drug Use: No  . Sexual Activity: Not on file   Other Topics Concern  . Not on file   Social History Narrative        Medication List       This list is accurate as of: 07/11/15  6:18 PM.  Always use your most recent med list.               ACAPELLA Misc  Use as directed     albuterol 108 (90 BASE) MCG/ACT inhaler  Commonly known as:  PROVENTIL HFA;VENTOLIN HFA  Inhale 2 puffs into the lungs every 4 (four) hours as needed for wheezing or shortness of breath.     amitriptyline 75 MG tablet  Commonly known as:  ELAVIL  Take 2 tablets at bedtime     aspirin 81 MG tablet  Take 81 mg by mouth daily.     atorvastatin 80 MG tablet  Commonly known as:  LIPITOR  TAKE 1 TABLET BY MOUTH DAILY     B-D TB SYRINGE 1CC/27GX1/2" 27G X 1/2" 1 ML Misc  Generic drug:  TUBERCULIN SYR 1CC/27GX1/2"  Use as directed.     Fluticasone-Salmeterol 100-50 MCG/DOSE Aepb  Commonly known as:  ADVAIR DISKUS  Inhale 1 puff into the lungs 2 (two) times daily.     FLUTTER Devi  1 Inhaler by Does not apply route 2 (two) times daily.     folic acid 800 MCG tablet  Commonly known as:  FOLVITE  Take 800  mcg by mouth daily.     furosemide 20 MG tablet  Commonly known as:  LASIX  Take 60 mg by mouth daily. 2 in AM, 1 at 4 PM     glucose blood test strip  Commonly known as:  ONE TOUCH ULTRA TEST  Test blood sugar no more than twice daily.     LOTEMAX 0.5 % Gel  Generic drug:  Loteprednol Etabonate  daily     methotrexate (PF) 50 MG/2ML injection  Inject 25 mg into the skin. Once a week     metoprolol tartrate 25 MG tablet  Commonly known as:  LOPRESSOR  Take 1 tablet (25 mg total) by mouth 2 (two) times daily.     nitroGLYCERIN 0.4 MG SL tablet  Commonly known as:  NITROSTAT  Place 1 tablet (0.4 mg total) under the tongue every 5 (five) minutes as needed for chest pain.     NON FORMULARY  EVERGO PORTABLE PULSE DOSE OXYGEN oxygen 5L per nasal cannula 24 hours a day Dx: 496     onetouch ultrasoft lancets  Take blood sugar no more than twice daily.     oxyCODONE-acetaminophen 7.5-325 MG tablet  Commonly known as:  PERCOCET  Take 1 tablet by mouth 4 (four) times daily. 3 times a day     pantoprazole 40 MG tablet  Commonly known as:  PROTONIX  Take 1 tablet (40 mg total) by mouth 2 (two) times daily.     PERFECT IRON PO  Take 1 tablet by mouth every morning.     potassium chloride SA 20 MEQ tablet  Commonly known as:  K-DUR,KLOR-CON  Take two (2) tablets (40 meq total) by mouth every morning and take one (1) tablet (20 meq total) by mouth every evening.     predniSONE 5 MG tablet  Commonly known as:  DELTASONE  Take 7 mg by mouth daily.     SPIRIVA HANDIHALER 18 MCG inhalation capsule  Generic drug:  tiotropium  INHALE 1 CAPSULE BY MOUTH IN HANDIHALER DAILY     UNABLE TO FIND  Med Name: Incentive Spirometer Use as directed     vitamin B-12 250 MCG tablet  Commonly known as:  CYANOCOBALAMIN  Take 250 mcg by mouth daily.     vitamin C  500 MG tablet  Commonly known as:  ASCORBIC ACID  Take 500 mg by mouth daily.           Objective:   Physical Exam    Constitutional: He is oriented to person, place, and time. He appears well-developed. No distress.  Musculoskeletal: Edema: no edema, calves no TTP.       Legs: Neurological: He is alert and oriented to person, place, and time.  Skin: He is not diaphoretic.  Psychiatric: He has a normal mood and affect. His behavior is normal. Judgment and thought content normal.   BP 128/76 mmHg  Pulse 72  Temp(Src) 98 F (36.7 C) (Oral)  Ht 6' (1.829 m)  Wt 229 lb 8 oz (104.101 kg)  BMI 31.12 kg/m2  SpO2 95%     Assessment & Plan:   Assessment > MSK: --RA --- Dr Sharren Bridge --Psoriasis --Osteoporosis Peripheral neuropathy, severe, several feet surgeries DM HTN Hyperlipidemia Pulmonary: --COPD --Pulmonary fibrosis --O2 24/ 7 --Chronic respiratory failure CAD, CHF, MI and  CABG 2007 GI --Barrettts --GERD --esophageal dysmotility --Fatty liver Hoarseness , chronic, saw ENT , s/p botox 2015 , helped tempoarrily   PLAN Phlebitis: suspect the patient has superficial phlebitis, recommend local care with warm compresses , continue aspirin, check a ultrasound to confirm. COPD: Pulmonary OV note reviewed--->  stable We will obtain the labs were order at the last visit.

## 2015-07-11 NOTE — Progress Notes (Signed)
Pre visit review using our clinic review tool, if applicable. No additional management support is needed unless otherwise documented below in the visit note. 

## 2015-07-11 NOTE — Assessment & Plan Note (Signed)
Phlebitis: suspect the patient has superficial phlebitis, recommend local care with warm compresses , continue aspirin, check a ultrasound to confirm. COPD: Pulmonary OV note reviewed--->  stable We will obtain the labs were order at the last visit.

## 2015-07-12 ENCOUNTER — Ambulatory Visit (HOSPITAL_BASED_OUTPATIENT_CLINIC_OR_DEPARTMENT_OTHER)
Admission: RE | Admit: 2015-07-12 | Discharge: 2015-07-12 | Disposition: A | Discharge status: Home / Self Care | Payer: BC Managed Care – PPO | Source: Ambulatory Visit | Attending: Internal Medicine | Admitting: Internal Medicine

## 2015-07-12 DIAGNOSIS — I8001 Phlebitis and thrombophlebitis of superficial vessels of right lower extremity: Secondary | ICD-10-CM | POA: Diagnosis not present

## 2015-07-12 DIAGNOSIS — Z87891 Personal history of nicotine dependence: Secondary | ICD-10-CM | POA: Insufficient documentation

## 2015-07-12 DIAGNOSIS — M79604 Pain in right leg: Secondary | ICD-10-CM | POA: Insufficient documentation

## 2015-07-12 DIAGNOSIS — I10 Essential (primary) hypertension: Secondary | ICD-10-CM | POA: Insufficient documentation

## 2015-07-12 DIAGNOSIS — E119 Type 2 diabetes mellitus without complications: Secondary | ICD-10-CM | POA: Insufficient documentation

## 2015-07-12 LAB — HIV ANTIBODY (ROUTINE TESTING W REFLEX): HIV: NONREACTIVE

## 2015-07-12 LAB — HEPATITIS C ANTIBODY: HCV AB: NEGATIVE

## 2015-07-14 ENCOUNTER — Other Ambulatory Visit: Payer: Self-pay | Admitting: Cardiology

## 2015-07-17 ENCOUNTER — Ambulatory Visit (INDEPENDENT_AMBULATORY_CARE_PROVIDER_SITE_OTHER): Payer: BC Managed Care – PPO | Admitting: Cardiology

## 2015-07-17 ENCOUNTER — Encounter: Payer: Self-pay | Admitting: Cardiology

## 2015-07-17 VITALS — BP 100/70 | HR 74 | Ht 72.0 in | Wt 228.7 lb

## 2015-07-17 DIAGNOSIS — I10 Essential (primary) hypertension: Secondary | ICD-10-CM

## 2015-07-17 DIAGNOSIS — I2510 Atherosclerotic heart disease of native coronary artery without angina pectoris: Secondary | ICD-10-CM

## 2015-07-17 MED ORDER — FUROSEMIDE 20 MG PO TABS: ORAL_TABLET | ORAL | Status: DC

## 2015-07-17 MED ORDER — ATORVASTATIN CALCIUM 80 MG PO TABS: 80.0000 mg | ORAL_TABLET | Freq: Every day | ORAL | Status: DC

## 2015-07-17 MED ORDER — METOPROLOL TARTRATE 25 MG PO TABS: 25.0000 mg | ORAL_TABLET | Freq: Two times a day (BID) | ORAL | Status: DC

## 2015-07-17 NOTE — Patient Instructions (Signed)
Your physician wants you to follow-up in: 1 Year. You will receive a reminder letter in the mail two months in advance. If you don't receive a letter, please call our office to schedule the follow-up appointment.  

## 2015-07-17 NOTE — Progress Notes (Signed)
HPI The patient presents follow up of CAD with bypass in 2007. Over the past year he's done well from a cardiovascular standpoint. Since I last saw her she has done well.  He has lost 35 lbs.  He is working on rehabilitation.  The patient denies any new symptoms such as chest discomfort, neck or arm discomfort. There has been no new shortness of breath, PND or orthopnea. There have been no reported palpitations, presyncope or syncope.  No Known Allergies  Current Outpatient Prescriptions  Medication Sig Dispense Refill  . albuterol (PROVENTIL HFA;VENTOLIN HFA) 108 (90 BASE) MCG/ACT inhaler Inhale 2 puffs into the lungs every 4 (four) hours as needed for wheezing or shortness of breath. 1 Inhaler 6  . amitriptyline (ELAVIL) 75 MG tablet Take 2 tablets at bedtime    . Ascorbic Acid (VITAMIN C) 500 MG tablet Take 500 mg by mouth daily.      Marland Kitchen aspirin 81 MG tablet Take 81 mg by mouth 4 (four) times daily.     Marland Kitchen atorvastatin (LIPITOR) 80 MG tablet TAKE 1 TABLET BY MOUTH DAILY 30 tablet 1  . B-D TB SYRINGE 1CC/27GX1/2" 27G X 1/2" 1 ML MISC Use as directed.    Marland Kitchen Carbonyl Iron (PERFECT IRON PO) Take 1 tablet by mouth every morning.     . Fluticasone-Salmeterol (ADVAIR DISKUS) 100-50 MCG/DOSE AEPB Inhale 1 puff into the lungs 2 (two) times daily. 60 each 6  . folic acid (FOLVITE) 800 MCG tablet Take 800 mcg by mouth daily.    . furosemide (LASIX) 20 MG tablet Take 60 mg by mouth daily. 2 in AM, 1 at 4 PM    . glucose blood (ONE TOUCH ULTRA TEST) test strip Test blood sugar no more than twice daily. 100 each 12  . Loteprednol Etabonate (LOTEMAX) 0.5 % GEL daily    . Methotrexate Sodium, PF, 50 MG/2ML SOLN Inject 25 mg into the skin. Once a week    . metoprolol tartrate (LOPRESSOR) 25 MG tablet Take 1 tablet (25 mg total) by mouth 2 (two) times daily. 60 tablet 0  . Misc. Devices (ACAPELLA) MISC Use as directed 1 each 0  . nitroGLYCERIN (NITROSTAT) 0.4 MG SL tablet Place 1 tablet (0.4 mg total) under  the tongue every 5 (five) minutes as needed for chest pain. 25 tablet 1  . NON FORMULARY EVERGO PORTABLE PULSE DOSE OXYGEN oxygen 5L per nasal cannula 24 hours a day Dx: 496    . oxyCODONE-acetaminophen (PERCOCET) 7.5-325 MG per tablet Take 1 tablet by mouth 3 (three) times daily. 3 times a day    . pantoprazole (PROTONIX) 40 MG tablet Take 1 tablet (40 mg total) by mouth 2 (two) times daily. 180 tablet 3  . potassium chloride SA (K-DUR,KLOR-CON) 20 MEQ tablet Take two (2) tablets (40 meq total) by mouth every morning and take one (1) tablet (20 meq total) by mouth every evening. (Patient taking differently: Take two (4) tablets (40 meq total) by mouth every morning and take one (2) tablet (20 meq total) by mouth every evening.) 30 tablet 0  . predniSONE (DELTASONE) 5 MG tablet Take 7 mg by mouth daily.     Marland Kitchen Respiratory Therapy Supplies (FLUTTER) DEVI 1 Inhaler by Does not apply route 2 (two) times daily. 1 each 0  . SPIRIVA HANDIHALER 18 MCG inhalation capsule INHALE 1 CAPSULE BY MOUTH IN HANDIHALER DAILY 30 capsule 5  . vitamin B-12 (CYANOCOBALAMIN) 250 MCG tablet Take 250 mcg by mouth daily.      . [  DISCONTINUED] omeprazole (PRILOSEC OTC) 20 MG tablet Take 1 tablet (20 mg total) by mouth daily. 60 tablet 0   No current facility-administered medications for this visit.    Past Medical History  Diagnosis Date  . Rheumatoid arthritis(714.0)   . Psoriasis   . Hypertension   . Gastroesophageal reflux disease   . COPD (chronic obstructive pulmonary disease) (HCC)   . CAD (coronary artery disease)     MI 2007, CABG  . Osteoporosis     per DEXA 05-2008  . Peripheral polyneuropathy (HCC)     Severe, causing problems with his feet, see surgeries  . Diabetes mellitus     Dx 07-2008 A1C 6.2  . Onychomycosis     toenails  . Hiatal hernia   . Esophageal dysmotility   . Gastroparesis   . Fatty liver 10/11/09  . Pulmonary fibrosis (HCC)   . Neuropathy (HCC)     (related to RA per neuro note  05-2011)  . Oxygen dependent     3l  . CHF (congestive heart failure) (HCC)     pt stated he doesnt have this  . Complication of anesthesia     DIFFICULTY AFTER CABG HAD CONFUSION POST OP  . Myocardial infarction (HCC) 2007  . Shortness of breath   . Cataracts, bilateral   . Rheumatoid arthritis [facility])     [facility] Rheumatology    Past Surgical History  Procedure Laterality Date  . Foot surgery      to tendon release and repair of osteomyelitis   . Cholecystectomy    . Coronary artery bypass graft  2007    (LIMA to the LAD, left radial to obtuse marginal, SVG to first diagonal, SVG to PDA. His ejection fraction to 50-55%  . Achiles tendon surgery  8/09  . Toe amputation  4-12/ 3 14    d/t a deformity, found to have osteomyelitis, complicated by post-op foot strtess FX  . Esophagogastroduodenoscopy N/A 10/19/2012    Procedure: ESOPHAGOGASTRODUODENOSCOPY (EGD);  Surgeon: Hart Carwin, MD;  Location: [facility] ENDOSCOPY;  Service: Endoscopy;  Laterality: N/A;  the dilatation is possible.   . Laryngoscopy  01/21/2014     DR BATES   . Laryngoscopy N/A 01/21/2014    Procedure: LARYNGOSCOPY;  Surgeon: Christia Reading, MD;  Location: [facility] OR;  Service: ENT;  Laterality: N/A;  micro direct laryngoscopy with prolaryn injection/jet venturi ventilation   ROS   As stated in the HPI and negative for all other systems.   PHYSICAL EXAM BP 100/70 mmHg  Pulse 74  Ht 6' (1.829 m)  Wt 228 lb 11.2 oz (103.738 kg)  BMI 31.01 kg/m2 General: Well developed, well nourished, in no acute distress.  Head: normocephalic and atraumatic  Mouth: Teeth, gums and palate normal. Oral mucosa normal.  Neck: Neck supple, no JVD. No masses, thyromegaly or abnormal cervical nodes.  Chest Wall: well-healed sternotomy scar  Lungs: Clear bilaterally to auscultation and percussion.  Heart: S1 and S2 within normal limits, no S3, no S4, no clicks, no rubs, no murmurs  Abdomen: Bowel sounds positive; abdomen soft and non-tender without  masses, organomegaly, or hernias noted. No hepatosplenomegaly, obese  Msk: severe rheumatoid changes  Extremities: trace bilateral edema, his legs are in diabetic boots and I was unable to appreciate posterior tibialis or dorsalis pulses   EKG:  Sinus rhythm, rate 74, axis within normal limits, intervals within normal limits, no acute ST-T wave changes. Premature ventricular contractions. 07/17/2015   ASSESSMENT AND PLAN  CAD:  The patient has no new sypmtoms.  No further cardiovascular testing is indicated.  We will continue with aggressive risk reduction and meds as listed. He has had no symptoms since his last stress test.  I will likely plan a stress test next year as a will be 10 years since his bypass in 5 years since his last stress test.  HTN:  His blood pressure is well controlled. He will continue the meds as listed.  DYSLIPIDEMIA:  His lipid profile is excellent. He will continue the meds as listed.  Lab Results  Component Value Date   CHOL 136 07/11/2015   TRIG 146.0 07/11/2015   HDL 35.50* 07/11/2015   LDLCALC 71 07/11/2015    DM:  Per Willow Ora, MD  Lab Results  Component Value Date   HGBA1C 6.2 07/11/2015

## 2015-07-27 ENCOUNTER — Ambulatory Visit: Payer: BC Managed Care – PPO | Admitting: Cardiology

## 2015-07-27 ENCOUNTER — Telehealth: Payer: Self-pay | Admitting: Cardiology

## 2015-07-27 MED ORDER — POTASSIUM CHLORIDE CRYS ER 10 MEQ PO TBCR: EXTENDED_RELEASE_TABLET | ORAL | Status: DC

## 2015-07-27 NOTE — Telephone Encounter (Signed)
Spoke to pharmacist about Joseph [name]

## 2015-07-27 NOTE — Telephone Encounter (Signed)
Please call,concerning his Potassium medicine.

## 2015-07-28 ENCOUNTER — Other Ambulatory Visit: Payer: Self-pay | Admitting: *Deleted

## 2015-07-28 MED ORDER — POTASSIUM CHLORIDE CRYS ER 10 MEQ PO TBCR: EXTENDED_RELEASE_TABLET | ORAL | Status: DC

## 2015-08-15 ENCOUNTER — Encounter: Payer: Self-pay | Admitting: Internal Medicine

## 2015-08-30 ENCOUNTER — Telehealth: Payer: Self-pay | Admitting: Internal Medicine

## 2015-08-30 DIAGNOSIS — L97509 Non-pressure chronic ulcer of other part of unspecified foot with unspecified severity: Secondary | ICD-10-CM

## 2015-08-30 DIAGNOSIS — E11621 Type 2 diabetes mellitus with foot ulcer: Secondary | ICD-10-CM

## 2015-08-30 NOTE — Telephone Encounter (Signed)
Please find out who he is currently seeing and refer him to an alternative orthopedic doctor in town.

## 2015-08-30 NOTE — Telephone Encounter (Signed)
Caller name: Britta Mccreedy   Relationship to patient: Spouse   Can be reached:  (Home) [phone]     (cell) [phone]  Reason for call: Pt's spouse called in because they would like to have a second Ortho opinion for the pt's feet for his wounds. Pt would like to have a referral in the [facility] network if possible.    Please assist further. Pt's spouse says that it is urgent.

## 2015-08-30 NOTE — Telephone Encounter (Signed)
Please advise 

## 2015-08-31 NOTE — Telephone Encounter (Signed)
Second Opinion Ortho referral placed.

## 2015-08-31 NOTE — Telephone Encounter (Signed)
Pt currently seeing Dr. Samuel Bouche at Ortho in [facility] for Diabetic Foot Ulcers.

## 2015-09-07 ENCOUNTER — Telehealth: Payer: Self-pay | Admitting: Internal Medicine

## 2015-09-07 DIAGNOSIS — M069 Rheumatoid arthritis, unspecified: Secondary | ICD-10-CM

## 2015-09-07 NOTE — Telephone Encounter (Signed)
Please advise 

## 2015-09-07 NOTE — Telephone Encounter (Signed)
Called patient to schedule labs, he states that he also need an A1C

## 2015-09-07 NOTE — Telephone Encounter (Signed)
Caller name: Self  Can be reached: [phone]  Reason for call: patient is requesting to have two labs dones tomorrow morning. States he need a Methotrexate Panel and an RA Panel.

## 2015-09-07 NOTE — Telephone Encounter (Signed)
Enter a CBC ,CMP, rheumatoid factor ---- DX rheumatoid arthritis. Fax results to his rheumatologist. Advise patient of above labs, if he needs other labs he needs to let us know the name of the additional tests.

## 2015-09-07 NOTE — Telephone Encounter (Signed)
Labs ordered. Please schedule lab appt at Pt's convenience. Thank you.

## 2015-09-08 ENCOUNTER — Other Ambulatory Visit: Payer: BC Managed Care – PPO

## 2015-09-11 ENCOUNTER — Other Ambulatory Visit (INDEPENDENT_AMBULATORY_CARE_PROVIDER_SITE_OTHER): Payer: BC Managed Care – PPO

## 2015-09-11 DIAGNOSIS — M069 Rheumatoid arthritis, unspecified: Secondary | ICD-10-CM | POA: Diagnosis not present

## 2015-09-11 LAB — CBC WITH DIFFERENTIAL/PLATELET
BASOS ABS: 0 10*3/uL (ref 0.0–0.1)
BASOS PCT: 0.4 % (ref 0.0–3.0)
EOS ABS: 0.3 10*3/uL (ref 0.0–0.7)
Eosinophils Relative: 4.1 % (ref 0.0–5.0)
HEMATOCRIT: 44 % (ref 39.0–52.0)
Hemoglobin: 14.6 g/dL (ref 13.0–17.0)
LYMPHS ABS: 1.4 10*3/uL (ref 0.7–4.0)
Lymphocytes Relative: 23.1 % (ref 12.0–46.0)
MCHC: 33.2 g/dL (ref 30.0–36.0)
MCV: 97.5 fl (ref 78.0–100.0)
Monocytes Absolute: 0.7 10*3/uL (ref 0.1–1.0)
Monocytes Relative: 11.5 % (ref 3.0–12.0)
NEUTROS ABS: 3.8 10*3/uL (ref 1.4–7.7)
NEUTROS PCT: 60.9 % (ref 43.0–77.0)
PLATELETS: 188 10*3/uL (ref 150.0–400.0)
RBC: 4.51 Mil/uL (ref 4.22–5.81)
RDW: 15.3 % (ref 11.5–15.5)
WBC: 6.2 10*3/uL (ref 4.0–10.5)

## 2015-09-11 LAB — COMPREHENSIVE METABOLIC PANEL
ALT: 25 U/L (ref 0–53)
AST: 23 U/L (ref 0–37)
Albumin: 4 g/dL (ref 3.5–5.2)
Alkaline Phosphatase: 74 U/L (ref 39–117)
BILIRUBIN TOTAL: 0.8 mg/dL (ref 0.2–1.2)
BUN: 14 mg/dL (ref 6–23)
CHLORIDE: 104 meq/L (ref 96–112)
CO2: 24 meq/L (ref 19–32)
CREATININE: 0.74 mg/dL (ref 0.40–1.50)
Calcium: 9.1 mg/dL (ref 8.4–10.5)
GFR: 113.32 mL/min (ref >60.00)
GLUCOSE: 105 mg/dL — AB (ref 70–99)
Potassium: 3.9 mEq/L (ref 3.5–5.1)
SODIUM: 140 meq/L (ref 135–145)
Total Protein: 6.9 g/dL (ref 6.0–8.3)

## 2015-09-12 LAB — RHEUMATOID FACTOR: Rhuematoid fact SerPl-aCnc: 104 IU/mL — ABNORMAL HIGH (ref <=14)

## 2015-09-13 LAB — METHOTREXATE LEVEL: METHOTREXATE: 0.2 umol/L

## 2015-09-26 ENCOUNTER — Telehealth: Payer: Self-pay | Admitting: Internal Medicine

## 2015-09-26 DIAGNOSIS — E118 Type 2 diabetes mellitus with unspecified complications: Secondary | ICD-10-CM

## 2015-09-26 NOTE — Telephone Encounter (Signed)
Pt is requesting a referral to a podiatrist. He says that he has diabetic feet, pt would like to make sure that the specialist specializes in diabetic feet because pt says that he has had 2 toes amputated.    Pt's call back #: [phone]

## 2015-09-27 NOTE — Telephone Encounter (Signed)
Referral placed.

## 2015-10-05 ENCOUNTER — Ambulatory Visit (INDEPENDENT_AMBULATORY_CARE_PROVIDER_SITE_OTHER): Payer: BC Managed Care – PPO | Admitting: Pulmonary Disease

## 2015-10-05 ENCOUNTER — Encounter: Payer: Self-pay | Admitting: Pulmonary Disease

## 2015-10-05 VITALS — BP 138/78 | HR 91 | Ht 72.0 in | Wt 227.4 lb

## 2015-10-05 DIAGNOSIS — J471 Bronchiectasis with (acute) exacerbation: Secondary | ICD-10-CM | POA: Diagnosis not present

## 2015-10-05 MED ORDER — AMOXICILLIN-POT CLAVULANATE 875-125 MG PO TABS: 1.0000 | ORAL_TABLET | Freq: Two times a day (BID) | ORAL | Status: DC

## 2015-10-05 NOTE — Progress Notes (Signed)
Current Outpatient Prescriptions on File Prior to Visit  Medication Sig  . albuterol (PROVENTIL HFA;VENTOLIN HFA) 108 (90 BASE) MCG/ACT inhaler Inhale 2 puffs into the lungs every 4 (four) hours as needed for wheezing or shortness of breath.  Marland Kitchen amitriptyline (ELAVIL) 75 MG tablet Take 2 tablets at bedtime  . Ascorbic Acid (VITAMIN C) 500 MG tablet Take 500 mg by mouth daily.    Marland Kitchen aspirin 81 MG tablet Take 81 mg by mouth 4 (four) times daily.   Marland Kitchen atorvastatin (LIPITOR) 80 MG tablet Take 1 tablet (80 mg total) by mouth daily.  . B-D TB SYRINGE 1CC/27GX1/2" 27G X 1/2" 1 ML MISC Use as directed.  Marland Kitchen Carbonyl Iron (PERFECT IRON PO) Take 1 tablet by mouth every morning.   . Fluticasone-Salmeterol (ADVAIR DISKUS) 100-50 MCG/DOSE AEPB Inhale 1 puff into the lungs 2 (two) times daily.  . folic acid (FOLVITE) 800 MCG tablet Take 800 mcg by mouth daily.  . furosemide (LASIX) 20 MG tablet Take 2 tablets in the morning and 1 tablets at 4 pm  . glucose blood (ONE TOUCH ULTRA TEST) test strip Test blood sugar no more than twice daily.  . Loteprednol Etabonate (LOTEMAX) 0.5 % GEL daily  . Methotrexate Sodium, PF, 50 MG/2ML SOLN Inject 25 mg into the skin. Once a week  . metoprolol tartrate (LOPRESSOR) 25 MG tablet Take 1 tablet (25 mg total) by mouth 2 (two) times daily.  . Misc. Devices (ACAPELLA) MISC Use as directed  . nitroGLYCERIN (NITROSTAT) 0.4 MG SL tablet Place 1 tablet (0.4 mg total) under the tongue every 5 (five) minutes as needed for chest pain.  . NON FORMULARY EVERGO PORTABLE PULSE DOSE OXYGEN oxygen 5L per nasal cannula 24 hours a day Dx: 496  . oxyCODONE-acetaminophen (PERCOCET) 7.5-325 MG per tablet Take 1 tablet by mouth 3 (three) times daily. 3 times a day  . pantoprazole (PROTONIX) 40 MG tablet Take 1 tablet (40 mg total) by mouth 2 (two) times daily.  . potassium chloride (K-DUR,KLOR-CON) 10 MEQ tablet Take two (4) tablets (40 meq total) by mouth every morning and take one (2) tablet (20  meq total) by mouth every evening.  . predniSONE (DELTASONE) 5 MG tablet Take 7 mg by mouth daily. TAKE 7MG  DAILY, ALTERNATING WITH 6MG  DAILY  . Respiratory Therapy Supplies (FLUTTER) DEVI 1 Inhaler by Does not apply route 2 (two) times daily.  SPIRIVA HANDIHALER 18 MCG inhalation capsule INHALE 1 CAPSULE BY MOUTH IN HANDIHALER DAILY  . vitamin B-12 (CYANOCOBALAMIN) 250 MCG tablet Take 250 mcg by mouth daily.    . [DISCONTINUED] omeprazole (PRILOSEC OTC) 20 MG tablet Take 1 tablet (20 mg total) by mouth daily.   No current facility-administered medications on file prior to visit.     Chief Complaint  Patient presents with  . Follow-up    Pt c/o current productive cough with green mucus. Pt denies wheeze/CP/tightness/f/n/v/d. Pt is on O2/3L continuous.      Tests PFT 01/12/09 >> FEV1 3.02 (85%), FEV1% 68, TLC 6.35 (88%), DLCO 45%, no BD CT chest 10/17/12 >> severe centrilobular emphysema, apical bullae, 1 cm nodule LUL new, 5 mm nodule RLL stable since 2009, BTX with GGO at bases PFT 12/02/12 >> FEV1 3.11 (78%), FEV1% 77, TLC 6.32 (83%), DLCO 37%, no BD CT chest 05/06/13 >> decreased size LUL nodule now 8 mm, 4 mm nodule RLL, 3 mm nodule RML, 7 mm nodule RML, 8 mm nodule lingula CT chest 04/05/14 >> atherosclerosis, moderate diffuse  centrilobular and paraseptal emphysema, stable pulmonary nodules with dominant LUL nodule now 7 mm, steatosis of liver with ?early cirrhosis CT chest 05/02/15 >> stable pulmonary nodules since 2009, severe emphysema  Past medical hx Rheumatoid arthritis, psoriasis, HTN, GERD, HH, Gastroparesis, CAD s/p CABG 2007, Osteoporosis, Neuropathy, DM,   Past surgical hx, Allergies, Family hx, Social hx all reviewed.  Vital Signs BP 138/78 mmHg  Pulse 91  Ht 6' (1.829 m)  Wt 227 lb 6.4 oz (103.148 kg)  BMI 30.83 kg/m2  SpO2 95%  History of Present Illness Joseph [name] is a 65 y.o. male former smoker with COPD/emphysema, rheumatoid arthritis, recurrent  pneumonia 2nd to chronic aspiration/reflux, bronchiectasis, chronic respiratory failure on 3 liters oxygen 24/7.  He was doing okay until about a week ago.  He developed more cough and sputum.  His phlegm is now yellow.  He is not wheezing.  He denies fever, hemoptysis, chest pain, or sinus pain.  He has not had nausea or diarrhea.  He denies leg swelling.  He is worried that this is how he felt before things really flared up in his chest last year.  Physical Exam  General - No distress ENT - No sinus tenderness, no oral exudate, no LAN Cardiac - s1s2 regular, no murmur Chest - faint basilar crackles, no wheeze Back - No focal tenderness Abd - Soft, non-tender Ext - changes of RA Neuro - Normal strength Skin - No rashes Psych - normal mood, and behavior   Assessment/Plan  Acute exacerbation of bronchiectasis. Plan: - will give 1 week of augmentin with 1 refill as needed - send sputum for culture - continue bronchial hygiene with flutter valve, mucinex  COPD/emphysema. Plan: - continue spiriva, advair, and prn albuterol - continue monitored exercise program  Recurrent aspiration PNA with hx of GERD, HH, and gastroparesis. Plan: - continue bid protonix per GI  Chronic respiratory failure. Related to COPD/Emphysema. Plan: - continue 3 liters oxygen 24/7  Hx of Rheumatoid arthritis. Plan: - he is on MTX, and chronic prednisone per rheumatology, Dr. Annita [facility] at [facility]    Patient Instructions  Augmentin 1 pill twice daily for 7 days.  Can take an extra week if not improved.  Will arrange for sputum culture.  Call if not feeling better.  Follow up in 6 months.     [name], MD [facility] Pager:  [phone]

## 2015-10-05 NOTE — Patient Instructions (Signed)
Augmentin 1 pill twice daily for 7 days.  Can take an extra week if not improved.  Will arrange for sputum culture.  Call if not feeling better.  Follow up in 6 months.

## 2015-10-10 ENCOUNTER — Other Ambulatory Visit: Payer: BC Managed Care – PPO

## 2015-10-10 DIAGNOSIS — J471 Bronchiectasis with (acute) exacerbation: Secondary | ICD-10-CM

## 2015-10-12 ENCOUNTER — Telehealth: Payer: Self-pay | Admitting: Pulmonary Disease

## 2015-10-12 NOTE — Telephone Encounter (Signed)
He should take 2nd week of antibiotics.  He should take antibiotic with food.

## 2015-10-12 NOTE — Telephone Encounter (Signed)
Spoke with pt's wife, requesting sputum culture results.  I advised that culture results are not yet ready, but that we would contact them when the results are available.   Pt's wife states that pt is finishing first week of abx today, but was given a second week of abx to take if he wasn't feeling better.  Pt still has prod cough with yellow mucus, fatigue, sob with exertion, but s/s have improved since his OV on 10/05/15.  Pt's wife states that if pt takes abx on an empty stomach it gives him an upset stomach and diarrhea, but does ok with taking med with a meal.  Pt and wife unsure if they need to continue on with the second week of abx.    VS please advise.  Thanks!

## 2015-10-12 NOTE — Telephone Encounter (Signed)
Spouse aware of below. Nothing further needed 

## 2015-10-13 LAB — LOWER RESPIRATORY CULTURE

## 2015-10-16 ENCOUNTER — Telehealth: Payer: Self-pay | Admitting: Pulmonary Disease

## 2015-10-16 NOTE — Telephone Encounter (Signed)
Called spoke with pt. He is requesting his sputum culture results. Please advise Dr. Craige Cotta thanks

## 2015-10-17 ENCOUNTER — Ambulatory Visit (INDEPENDENT_AMBULATORY_CARE_PROVIDER_SITE_OTHER): Payer: BC Managed Care – PPO | Admitting: Internal Medicine

## 2015-10-17 ENCOUNTER — Encounter: Payer: Self-pay | Admitting: Internal Medicine

## 2015-10-17 VITALS — BP 126/78 | HR 80 | Temp 98.2°F | Ht 72.0 in | Wt 219.0 lb

## 2015-10-17 DIAGNOSIS — M069 Rheumatoid arthritis, unspecified: Secondary | ICD-10-CM

## 2015-10-17 DIAGNOSIS — I809 Phlebitis and thrombophlebitis of unspecified site: Secondary | ICD-10-CM

## 2015-10-17 DIAGNOSIS — J439 Emphysema, unspecified: Secondary | ICD-10-CM

## 2015-10-17 DIAGNOSIS — Z09 Encounter for follow-up examination after completed treatment for conditions other than malignant neoplasm: Secondary | ICD-10-CM

## 2015-10-17 NOTE — Telephone Encounter (Signed)
No unusual organisms, no change rx

## 2015-10-17 NOTE — Progress Notes (Signed)
Subjective:    Patient ID: Joseph [name], male    DOB: 1951-01-05, 65 y.o.   MRN: [id number]  DOS:  10/17/2015 Type of visit - description : Follow-up Interval history: @ last visit, he was diagnosed with phlebitis, on aspirin 4 tablets daily, the area looks slightly better to him. Denies any pain, fever, chills or discharge   Review of Systems Recently diagnosed with bronchitis, was prescribed antibiotics, feeling better. Did have diarrhea while taking the antibiotics but that's improving. No chest pain or difficulty breathing  Past Medical History  Diagnosis Date  . Rheumatoid arthritis(714.0)   . Psoriasis   . Hypertension   . Gastroesophageal reflux disease   . COPD (chronic obstructive pulmonary disease) (HCC)   . CAD (coronary artery disease)     MI 2007, CABG  . Osteoporosis     per DEXA 05-2008  . Peripheral polyneuropathy (HCC)     Severe, causing problems with his feet, see surgeries  . Diabetes mellitus     Dx 07-2008 A1C 6.2  . Onychomycosis     toenails  . Hiatal hernia   . Esophageal dysmotility   . Gastroparesis   . Fatty liver 10/11/09  . Neuropathy (HCC)     (related to RA per neuro note 05-2011)  . Oxygen dependent     3l  . Complication of anesthesia     DIFFICULTY AFTER CABG HAD CONFUSION POST OP  . Myocardial infarction (HCC) 2007  . Shortness of breath   . Cataracts, bilateral   . Rheumatoid arthritis [facility])     [facility] [place] [facility]    Past Surgical History  Procedure Laterality Date  . Foot surgery      to tendon release and repair of osteomyelitis   . Cholecystectomy    . Coronary artery bypass graft  2007    (LIMA to the LAD, left radial to obtuse marginal, SVG to first diagonal, SVG to PDA. His ejection fraction to 50-55%  . Achiles tendon surgery  8/09  . Toe amputation  4-12/ 3 14    d/t a deformity, found to have osteomyelitis, complicated by post-op foot strtess FX  . Esophagogastroduodenoscopy N/A 10/19/2012    Procedure:  ESOPHAGOGASTRODUODENOSCOPY (EGD);  Surgeon: Hart Carwin, MD;  Location: [facility] ENDOSCOPY;  Service: Endoscopy;  Laterality: N/A;  the dilatation is possible.   . Laryngoscopy  01/21/2014     DR BATES   . Laryngoscopy N/A 01/21/2014    Procedure: LARYNGOSCOPY;  Surgeon: Christia Reading, MD;  Location: [facility] OR;  Service: ENT;  Laterality: N/A;  micro direct laryngoscopy with prolaryn injection/jet venturi ventilation    Social History   Social History  . Marital Status: Married    Spouse Name: N/A  . Number of Children: 0  . Years of Education: N/A   Occupational History  . disabled-retired    Social History Main Topics  . Smoking status: Former Smoker -- 1.00 packs/day for 40 years    Quit date: 08/26/2005  . Smokeless tobacco: Former Neurosurgeon    Quit date: 04/02/2006     Comment: smoked x 40 years, 1 ppd  . Alcohol Use: No  . Drug Use: No  . Sexual Activity: Not on file   Other Topics Concern  . Not on file   Social History Narrative        Medication List       This list is accurate as of: 10/17/15 11:59 PM.  Always use your most recent med list.  ACAPELLA Misc  Use as directed     albuterol 108 (90 Base) MCG/ACT inhaler  Commonly known as:  PROVENTIL HFA;VENTOLIN HFA  Inhale 2 puffs into the lungs every 4 (four) hours as needed for wheezing or shortness of breath.     amitriptyline 75 MG tablet  Commonly known as:  ELAVIL  Take 2 tablets at bedtime     amoxicillin-clavulanate 875-125 MG tablet  Commonly known as:  AUGMENTIN  Take 1 tablet by mouth 2 (two) times daily.     aspirin 81 MG tablet  Take 81 mg by mouth daily.     atorvastatin 80 MG tablet  Commonly known as:  LIPITOR  Take 1 tablet (80 mg total) by mouth daily.     B-D TB SYRINGE 1CC/27GX1/2" 27G X 1/2" 1 ML Misc  Generic drug:  TUBERCULIN SYR 1CC/27GX1/2"  Reported on 10/17/2015     Fluticasone-Salmeterol 100-50 MCG/DOSE Aepb  Commonly known as:  ADVAIR DISKUS  Inhale 1 puff into  the lungs 2 (two) times daily.     FLUTTER Devi  1 Inhaler by Does not apply route 2 (two) times daily.     folic acid 800 MCG tablet  Commonly known as:  FOLVITE  Take 800 mcg by mouth daily.     furosemide 20 MG tablet  Commonly known as:  LASIX  Take 2 tablets in the morning and 1 tablets at 4 pm     glucose blood test strip  Commonly known as:  ONE TOUCH ULTRA TEST  Test blood sugar no more than twice daily.     LOTEMAX 0.5 % Gel  Generic drug:  Loteprednol Etabonate  daily     methotrexate (PF) 50 MG/2ML injection  Inject 25 mg into the skin. Once a week     metoprolol tartrate 25 MG tablet  Commonly known as:  LOPRESSOR  Take 1 tablet (25 mg total) by mouth 2 (two) times daily.     nitroGLYCERIN 0.4 MG SL tablet  Commonly known as:  NITROSTAT  Place 1 tablet (0.4 mg total) under the tongue every 5 (five) minutes as needed for chest pain.     NON FORMULARY  EVERGO PORTABLE PULSE DOSE OXYGEN oxygen 5L per nasal cannula 24 hours a day Dx: 496     oxyCODONE-acetaminophen 7.5-325 MG tablet  Commonly known as:  PERCOCET  Take 1 tablet by mouth 3 (three) times daily. 3 times a day     pantoprazole 40 MG tablet  Commonly known as:  PROTONIX  Take 1 tablet (40 mg total) by mouth 2 (two) times daily.     PERFECT IRON PO  Take 1 tablet by mouth every morning.     potassium chloride 10 MEQ tablet  Commonly known as:  K-DUR,KLOR-CON  Take two (4) tablets (40 meq total) by mouth every morning and take one (2) tablet (20 meq total) by mouth every evening.     predniSONE 5 MG tablet  Commonly known as:  DELTASONE  Take 7 mg by mouth daily. TAKE 7MG  DAILY, ALTERNATING WITH 6MG  DAILY     SPIRIVA HANDIHALER 18 MCG inhalation capsule  Generic drug:  tiotropium  INHALE 1 CAPSULE BY MOUTH IN HANDIHALER DAILY     vitamin B-12 250 MCG tablet  Commonly known as:  CYANOCOBALAMIN  Take 250 mcg by mouth daily.     vitamin C 500 MG tablet  Commonly known as:  ASCORBIC ACID    Take 500 mg by mouth daily.  Objective:   Physical Exam  Skin:      BP 126/78 mmHg  Pulse 80  Temp(Src) 98.2 F (36.8 C) (Oral)  Ht 6' (1.829 m)  Wt 219 lb (99.338 kg)  BMI 29.70 kg/m2  SpO2 94% General:   Well developed, well nourished . NAD.  HEENT:  Normocephalic . Face symmetric, atraumatic Lungs:  CTA B Normal respiratory effort, no intercostal retractions, no accessory muscle use. Heart: RRR,  no murmur.  No pretibial edema bilaterally  Skin: Not pale. Not jaundice Neurologic:  alert & oriented X3.  Speech normal, gait  assisted, at baseline Psych--  Cognition and judgment appear intact.  Cooperative with normal attention span and concentration.  Behavior appropriate. No anxious or depressed appearing.      Assessment & Plan:   Assessment > MS[facility]: --RA --- Dr Sharren Bridge --Psoriasis --Osteoporosis Peripheral neuropathy, severe, several feet surgeries DM HTN Hyperlipidemia Pulmonary: --COPD --Pulmonary fibrosis --O2 24/ 7 --Chronic respiratory failure CAD, CHF, MI and  CABG 2007 GI --Barrettts --GERD --esophageal dysmotility --Fatty liver Hoarseness , chronic, saw ENT , s/p botox 2015 , helped tempoarrily   PLAN Phlebitis:  Area of phlebitis continue with induration, at this time I cannot feel a inflammated vein on exam. For now I recommend observation, come back in 4 months, I asked him to show this area to his rheumatologist. If the induration persists, further eval  may be needed. COPD:  Recently seen with exacerbation, exam today is very good. RA: disability Parking  form signed RTC 4 months

## 2015-10-17 NOTE — Telephone Encounter (Signed)
Per 10/12/15 OV: [name], MD at 10/12/2015 4:02 PM     Status: Signed       Expand All Collapse All   He should take 2nd week of antibiotics. He should take antibiotic with food.      ----- Called spoke with pt. He is aware of results.  He reports he is taking ABX augmentin with food. He is having terrible diarrhea since and reports he has "lost 10 lbs" d/t this. He wants to know if he should continue this or change to another ABX. VS was 11pm elink last night and not on schedule. Please advise MW thanks

## 2015-10-17 NOTE — Progress Notes (Signed)
Pre visit review using our clinic review tool, if applicable. No additional management support is needed unless otherwise documented below in the visit note. 

## 2015-10-17 NOTE — Patient Instructions (Signed)
   GO TO THE FRONT DESK Schedule a routine office visit or check up to be done in  4 months  No  fasting

## 2015-10-17 NOTE — Telephone Encounter (Signed)
Recent Results (from the past 240 hour(s))  Sputum culture     Status: None   Collection Time: 10/10/15  3:55 PM  Result Value Ref Range Status   Lower Respiratory Culture Final report  Final   Result 1 Comment  Final    Comment: Routine respiratory flora Moderate growth     Please inform him that his sputum culture only showed normal respiratory flora.  No change to current tx plan needed.

## 2015-10-17 NOTE — Telephone Encounter (Signed)
Pt aware of rec's per MW Pt states that he will finish the abx he is currently taking Pt to call once he completes the abx if not feeling any better. Nothing further needed.

## 2015-10-18 ENCOUNTER — Other Ambulatory Visit: Payer: Self-pay | Admitting: Cardiology

## 2015-10-18 NOTE — Assessment & Plan Note (Signed)
Phlebitis:  Area of phlebitis continue with induration, at this time I cannot feel a inflammated vein on exam. For now I recommend observation, come back in 4 months, I asked him to show this area to his rheumatologist. If the induration persists, further eval  may be needed. COPD:  Recently seen with exacerbation, exam today is very good. RA: disability Parking  form signed RTC 4 months

## 2015-10-19 ENCOUNTER — Other Ambulatory Visit: Payer: Self-pay

## 2015-10-19 NOTE — Telephone Encounter (Signed)
Rx(s) sent to pharmacy electronically.  

## 2015-10-30 ENCOUNTER — Ambulatory Visit (INDEPENDENT_AMBULATORY_CARE_PROVIDER_SITE_OTHER): Payer: BC Managed Care – PPO | Admitting: Podiatry

## 2015-10-30 VITALS — Resp 16 | Ht 72.0 in | Wt 219.0 lb

## 2015-10-30 DIAGNOSIS — M79675 Pain in left toe(s): Secondary | ICD-10-CM | POA: Diagnosis not present

## 2015-10-30 DIAGNOSIS — B351 Tinea unguium: Secondary | ICD-10-CM | POA: Diagnosis not present

## 2015-10-30 DIAGNOSIS — M79674 Pain in right toe(s): Secondary | ICD-10-CM | POA: Diagnosis not present

## 2015-10-30 DIAGNOSIS — M79604 Pain in right leg: Secondary | ICD-10-CM

## 2015-10-30 DIAGNOSIS — M79605 Pain in left leg: Secondary | ICD-10-CM

## 2015-10-30 NOTE — Progress Notes (Signed)
   Subjective:    Patient ID: Joseph [name], male    DOB: 24-Jun-1951, 65 y.o.   MRN: [id number]  HPI Patient presents with needing a bilateral nail trim; nail trim.  Pt diabetic type 2; sugar=118 this am; A1C=6.2; peripheral neuropathy   Review of Systems     Objective:   Physical Exam        Assessment & Plan:

## 2015-11-01 ENCOUNTER — Ambulatory Visit: Payer: BC Managed Care – PPO | Admitting: Podiatry

## 2015-11-01 NOTE — Progress Notes (Signed)
Subjective:     Patient ID: Joseph [name], male   DOB: 1950-11-27, 65 y.o.   MRN: [id number]  HPI patient presents and severe poor health with long-term rheumatoid arthritis and neuropathy condition. Has elongated nailbeds 1-5 both feet with thick yellow brittle debris that they cannot cut and presents with caregiver   Review of Systems  All other systems reviewed and are negative.      Objective:   Physical Exam  Constitutional: He is oriented to person, place, and time.  Cardiovascular: Intact distal pulses.   Musculoskeletal: Normal range of motion.  Neurological: He is oriented to person, place, and time.  Skin: Skin is warm and dry.  Nursing note and vitals reviewed.  vascular status intact with significant diminishment of neurological function bilateral with patient found have diminished range of motion subtalar midtarsal joint and is noted to have severe nail disease 1-5 both feet that are thick yellow brittle and painful when pressed. Patient is found to have significant forefoot contracture bilateral and moderate collapse midtarsal joint secondary to long-term rheumatoid arthritis     Assessment:     At risk individual with systemic arthritis neuropathy with severe nail disease 1-5 bilateral with pain    Plan:     H&P different conditions reviewed and at this time nail debridement accomplished with no iatrogenic bleeding noted may eventually require some other forms of immobilization but does currently have molded-type shoes

## 2015-11-07 ENCOUNTER — Telehealth: Payer: Self-pay | Admitting: Podiatry

## 2015-11-07 NOTE — Telephone Encounter (Signed)
Left vm for pt wife to call office back

## 2015-11-10 ENCOUNTER — Ambulatory Visit: Payer: BC Managed Care – PPO | Admitting: Internal Medicine

## 2015-11-13 ENCOUNTER — Ambulatory Visit: Payer: BC Managed Care – PPO | Admitting: Internal Medicine

## 2015-12-05 ENCOUNTER — Ambulatory Visit: Payer: BC Managed Care – PPO | Admitting: Internal Medicine

## 2015-12-06 ENCOUNTER — Ambulatory Visit: Payer: BC Managed Care – PPO | Admitting: Pulmonary Disease

## 2015-12-12 ENCOUNTER — Encounter: Payer: Self-pay | Admitting: Adult Health

## 2015-12-12 ENCOUNTER — Ambulatory Visit (INDEPENDENT_AMBULATORY_CARE_PROVIDER_SITE_OTHER): Payer: BC Managed Care – PPO | Admitting: Adult Health

## 2015-12-12 VITALS — BP 108/64 | HR 77 | Temp 98.0°F | Ht 72.0 in | Wt 229.0 lb

## 2015-12-12 DIAGNOSIS — J439 Emphysema, unspecified: Secondary | ICD-10-CM

## 2015-12-12 DIAGNOSIS — J9611 Chronic respiratory failure with hypoxia: Secondary | ICD-10-CM | POA: Diagnosis not present

## 2015-12-12 MED ORDER — TIOTROPIUM BROMIDE MONOHYDRATE 18 MCG IN CAPS: ORAL_CAPSULE | RESPIRATORY_TRACT | Status: DC

## 2015-12-12 MED ORDER — FLUTICASONE-SALMETEROL 100-50 MCG/DOSE IN AEPB: 1.0000 | INHALATION_SPRAY | Freq: Two times a day (BID) | RESPIRATORY_TRACT | Status: DC

## 2015-12-12 NOTE — Assessment & Plan Note (Signed)
Continue on Oxygen 3l/m  Follow up Dr. Craige Cotta  6 months and As needed

## 2015-12-12 NOTE — Patient Instructions (Signed)
Continue on Spiriva and Advair .  Continue on Oxygen 3l/m  Follow up Dr. Craige Cotta  6 months and As needed

## 2015-12-12 NOTE — Progress Notes (Signed)
Reviewed and agree with assessment/plan.  Jordana Dugue, MD [facility] 12/12/2015, 3:48 PM Pager:  [phone]   

## 2015-12-12 NOTE — Assessment & Plan Note (Signed)
Recent flare now resolved   Plan  Continue on Spiriva and Advair .  Continue on Oxygen 3l/m  Follow up Dr. Craige Cotta  6 months and As needed

## 2015-12-12 NOTE — Progress Notes (Signed)
Subjective:    Patient ID: Joseph [name], male    DOB: 1951/06/05, 65 y.o.   MRN: [id number]  HPI 65 yo male former smoker with COPD/emphysema, rheumatoid arthritis, recurrent pneumonia 2nd to chronic aspiration/reflux, bronchiectasis, chronic respiratory failure on 3 liters oxygen 24/7, and pulmonary nodules.  TESTS: PFT 01/12/09 >> FEV1 3.02 (85%), FEV1% 68, TLC 6.35 (88%), DLCO 45%, no BD CT chest 10/17/12 >> severe centrilobular emphysema, apical bullae, 1 cm nodule LUL new, 5 mm nodule RLL stable since 2009, BTX with GGO at bases PFT 12/02/12 >> FEV1 3.11 (78%), FEV1% 77, TLC 6.32 (83%), DLCO 37%, no BD CT chest 05/06/13 >> decreased size LUL nodule now 8 mm, 4 mm nodule RLL, 3 mm nodule RML, 7 mm nodule RML, 8 mm nodule lingula CT chest 04/05/14 >> atherosclerosis, moderate diffuse centrilobular and paraseptal emphysema, stable pulmonary nodules with dominant LUL nodule now 7 mm, steatosis of liver with ?early cirrhosis  PMHx >> RA, Psoriasis, HTN, CAD s/p CABG, GERD, HH, Gastroparesis, DM, Peripheral neuropathy  PSHx, Medications, Allergies, Fhx, Shx reviewed.  12/12/2015 Follow up COPD /O2 dependent  Pt returns for 2 month follow up .  Had COPD flare last ov, tx with augmentin .  He is feeling better. Decreased cough and dyspnea.  Remains on Spiriva and Advair.  On Oxygen 3l/m . Denies any chest pain, orthopnea, PND, increased leg swelling, fever. Patient is followed by rheumatology for his rheumatoid arthritis and remains on methotrexate and chronic  Prednisone.    Review of Systems   Constitutional:   No  weight loss, night sweats,  Fevers, chills,  +fatigue, or  lassitude.  HEENT:   No headaches,  Difficulty swallowing,  Tooth/dental problems, or  Sore throat,                No sneezing, itching, ear ache, nasal congestion, post nasal drip,   CV:  No chest pain,  Orthopnea, PND, swelling in lower extremities, anasarca, dizziness, palpitations, syncope.   GI  No  heartburn, indigestion, abdominal pain, nausea, vomiting, diarrhea, change in bowel habits, loss of appetite, bloody stools.   Resp:  .  No chest wall deformity  Skin: no rash or lesions.  GU: no dysuria, change in color of urine, no urgency or frequency.  No flank pain, no hematuria   MS:  No joint pain or swelling.  No decreased range of motion.  No back pain.  Psych:  No change in mood or affect. No depression or anxiety.  No memory loss.          Objective:   Physical Exam  Filed Vitals:   12/12/15 1157  BP: 108/64  Pulse: 77  Temp: 98 F (36.7 C)  TempSrc: Oral  Height: 6' (1.829 m)  Weight: 229 lb (103.874 kg)  SpO2: 95%    GEN: A/Ox3; pleasant , NAD chronically ill appearing   HEENT:  [place]/AT,  EACs-clear, TMs-wnl, NOSE-clear, THROAT-clear, no lesions, no postnasal drip or exudate noted.   NECK:  Supple w/ fair ROM; no JVD; normal carotid impulses w/o bruits; no thyromegaly or nodules palpated; no lymphadenopathy.  RESP  Decreased BS in bases no , wheezes/ rales/ or rhonchi.no accessory muscle use, no dullness to percussion  CARD:  RRR, no m/r/g  , no peripheral edema, pulses intact, no cyanosis or clubbing.  GI:   Soft & nt; nml bowel sounds; no organomegaly or masses detected.  Musco: Warm bil, no deformities or joint swelling noted.  Arthritic  changes of the hands.  Neuro: alert, no focal deficits noted.    Skin: Warm, no lesions or rashes  Tammy Parrett NP-C  [facility]  12/12/2015        Assessment & Plan:

## 2016-01-08 ENCOUNTER — Other Ambulatory Visit: Payer: BC Managed Care – PPO

## 2016-01-10 ENCOUNTER — Ambulatory Visit: Payer: BC Managed Care – PPO | Admitting: Podiatry

## 2016-01-18 ENCOUNTER — Other Ambulatory Visit (INDEPENDENT_AMBULATORY_CARE_PROVIDER_SITE_OTHER): Payer: BC Managed Care – PPO

## 2016-01-18 ENCOUNTER — Other Ambulatory Visit: Payer: Self-pay | Admitting: Emergency Medicine

## 2016-01-18 DIAGNOSIS — M069 Rheumatoid arthritis, unspecified: Secondary | ICD-10-CM | POA: Diagnosis not present

## 2016-01-18 LAB — CBC
HCT: 41.2 % (ref 39.0–52.0)
Hemoglobin: 14.1 g/dL (ref 13.0–17.0)
MCHC: 34.1 g/dL (ref 30.0–36.0)
MCV: 90.8 fl (ref 78.0–100.0)
PLATELETS: 170 10*3/uL (ref 150.0–400.0)
RBC: 4.54 Mil/uL (ref 4.22–5.81)
RDW: 14.8 % (ref 11.5–15.5)
WBC: 5 10*3/uL (ref 4.0–10.5)

## 2016-01-18 LAB — ALBUMIN: ALBUMIN: 4 g/dL (ref 3.5–5.2)

## 2016-01-18 LAB — CREATININE, SERUM: CREATININE: 0.64 mg/dL (ref 0.40–1.50)

## 2016-01-18 LAB — ALT: ALT: 24 U/L (ref 0–53)

## 2016-01-18 LAB — AST: AST: 24 U/L (ref 0–37)

## 2016-01-19 ENCOUNTER — Emergency Department (HOSPITAL_BASED_OUTPATIENT_CLINIC_OR_DEPARTMENT_OTHER): Payer: BC Managed Care – PPO

## 2016-01-19 ENCOUNTER — Observation Stay (HOSPITAL_BASED_OUTPATIENT_CLINIC_OR_DEPARTMENT_OTHER)
Admission: EM | Admit: 2016-01-19 | Discharge: 2016-01-23 | Disposition: A | Discharge status: Home / Self Care | Payer: BC Managed Care – PPO | Attending: Cardiology | Admitting: Cardiology

## 2016-01-19 ENCOUNTER — Encounter (HOSPITAL_BASED_OUTPATIENT_CLINIC_OR_DEPARTMENT_OTHER): Payer: Self-pay | Admitting: *Deleted

## 2016-01-19 DIAGNOSIS — L409 Psoriasis, unspecified: Secondary | ICD-10-CM | POA: Diagnosis not present

## 2016-01-19 DIAGNOSIS — I309 Acute pericarditis, unspecified: Secondary | ICD-10-CM | POA: Diagnosis not present

## 2016-01-19 DIAGNOSIS — I10 Essential (primary) hypertension: Secondary | ICD-10-CM | POA: Diagnosis not present

## 2016-01-19 DIAGNOSIS — Z87891 Personal history of nicotine dependence: Secondary | ICD-10-CM | POA: Insufficient documentation

## 2016-01-19 DIAGNOSIS — I110 Hypertensive heart disease with heart failure: Secondary | ICD-10-CM | POA: Insufficient documentation

## 2016-01-19 DIAGNOSIS — I252 Old myocardial infarction: Secondary | ICD-10-CM | POA: Insufficient documentation

## 2016-01-19 DIAGNOSIS — I2582 Chronic total occlusion of coronary artery: Secondary | ICD-10-CM | POA: Insufficient documentation

## 2016-01-19 DIAGNOSIS — E1140 Type 2 diabetes mellitus with diabetic neuropathy, unspecified: Secondary | ICD-10-CM | POA: Diagnosis not present

## 2016-01-19 DIAGNOSIS — R7889 Finding of other specified substances, not normally found in blood: Secondary | ICD-10-CM

## 2016-01-19 DIAGNOSIS — M069 Rheumatoid arthritis, unspecified: Secondary | ICD-10-CM | POA: Diagnosis not present

## 2016-01-19 DIAGNOSIS — R0789 Other chest pain: Principal | ICD-10-CM | POA: Insufficient documentation

## 2016-01-19 DIAGNOSIS — J449 Chronic obstructive pulmonary disease, unspecified: Secondary | ICD-10-CM | POA: Diagnosis not present

## 2016-01-19 DIAGNOSIS — I25810 Atherosclerosis of coronary artery bypass graft(s) without angina pectoris: Secondary | ICD-10-CM | POA: Insufficient documentation

## 2016-01-19 DIAGNOSIS — K219 Gastro-esophageal reflux disease without esophagitis: Secondary | ICD-10-CM | POA: Diagnosis not present

## 2016-01-19 DIAGNOSIS — R778 Other specified abnormalities of plasma proteins: Secondary | ICD-10-CM

## 2016-01-19 DIAGNOSIS — J439 Emphysema, unspecified: Secondary | ICD-10-CM

## 2016-01-19 DIAGNOSIS — I5032 Chronic diastolic (congestive) heart failure: Secondary | ICD-10-CM | POA: Diagnosis not present

## 2016-01-19 DIAGNOSIS — Z79899 Other long term (current) drug therapy: Secondary | ICD-10-CM | POA: Diagnosis not present

## 2016-01-19 DIAGNOSIS — R079 Chest pain, unspecified: Secondary | ICD-10-CM | POA: Diagnosis present

## 2016-01-19 DIAGNOSIS — Z7982 Long term (current) use of aspirin: Secondary | ICD-10-CM | POA: Diagnosis not present

## 2016-01-19 DIAGNOSIS — I2510 Atherosclerotic heart disease of native coronary artery without angina pectoris: Secondary | ICD-10-CM | POA: Insufficient documentation

## 2016-01-19 DIAGNOSIS — R7989 Other specified abnormal findings of blood chemistry: Secondary | ICD-10-CM | POA: Insufficient documentation

## 2016-01-19 DIAGNOSIS — Z9981 Dependence on supplemental oxygen: Secondary | ICD-10-CM | POA: Insufficient documentation

## 2016-01-19 LAB — CBC
HCT: 42 % (ref 39.0–52.0)
Hemoglobin: 14.3 g/dL (ref 13.0–17.0)
MCH: 31.4 pg (ref 26.0–34.0)
MCHC: 34 g/dL (ref 30.0–36.0)
MCV: 92.1 fL (ref 78.0–100.0)
PLATELETS: 178 10*3/uL (ref 150–400)
RBC: 4.56 MIL/uL (ref 4.22–5.81)
RDW: 14.5 % (ref 11.5–15.5)
WBC: 5.7 10*3/uL (ref 4.0–10.5)

## 2016-01-19 LAB — BASIC METABOLIC PANEL
Anion gap: 10 (ref 5–15)
BUN: 12 mg/dL (ref 6–20)
CHLORIDE: 105 mmol/L (ref 101–111)
CO2: 24 mmol/L (ref 22–32)
CREATININE: 0.65 mg/dL (ref 0.61–1.24)
Calcium: 9.4 mg/dL (ref 8.9–10.3)
GFR calc Af Amer: >60 mL/min (ref >60)
GFR calc non Af Amer: >60 mL/min (ref >60)
Glucose, Bld: 134 mg/dL — ABNORMAL HIGH (ref 65–99)
Potassium: 3.6 mmol/L (ref 3.5–5.1)
SODIUM: 139 mmol/L (ref 135–145)

## 2016-01-19 LAB — TROPONIN I: Troponin I: 0.06 ng/mL — ABNORMAL HIGH (ref <0.031)

## 2016-01-19 MED ORDER — ASPIRIN 81 MG PO CHEW
324.0000 mg | CHEWABLE_TABLET | Freq: Once | ORAL | Status: AC
  Administered 2016-01-19: 324 mg via ORAL
  Filled 2016-01-19: qty 4

## 2016-01-19 MED ORDER — IOPAMIDOL (ISOVUE-370) INJECTION 76%
100.0000 mL | Freq: Once | INTRAVENOUS | Status: AC | PRN
  Administered 2016-01-19: 100 mL via INTRAVENOUS

## 2016-01-19 NOTE — H&P (Signed)
History & Physical    Patient ID: WOODWARD KLEM MRN: [id number], DOB/AGE: May 19, 1951  Admit date: 01/19/2016 Primary Physician: Willow Ora, MD Primary Cardiologist: Rollene Rotunda, MD  CC:  CP  HPI    65 yo M w a h/o CAD s/p CABG (2007, LIMA-LAD, left radial-OM, SVG-D1, SVG-PDA), DM with neuropathy, HTN, COPD on 3L [place], recurrent pneumonia secondary to chronic aspiration/reflux, RA, & Psoriasis is currently being transferred from [facility] where he presented with over 24 hours of left-sided pleuritic chest pain that started while he was shopping yesterday afternoon, occurring every time he took a deep breath.  He denied association with oral intake, position, or activity.  He denied associated symptoms.  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  He denied recent travel, surgery, trauma to site, or edema.    He last saw his primary cardiologist 07/17/15 when he was doing well.  At that point, the plan had been for a follow-up stress test the following year, 10 years out from his CABG.  Due to his RA & neuropathy, his physical activity is somewhat limited.  He chronically sleeps sitting up due to his tendency to aspirate.  He denied lower extremity swelling or paroxysmal nocturnal dyspnea.    While he was in the OSH ED, he was treated with ASA 324.  During the time of my evaluation SL NTG x 2 was given, which resulted in improvement in his pain from a 6/10 to a 3/10.  This was the first intervention that caused improvement in his pain.  A third dose was not given as his SBP dropped from the 140's to 100's.  At that point, NTG paste was applied.    Relevant Data - EKG (01/19/16):  Sinus rhythm with frequent PVC, nonspecific T wave abnormalities - Chest CTA (01/19/16):  No evidence of PE  Past Medical History   Past Medical History  Diagnosis Date  . Rheumatoid arthritis(714.0)   . Psoriasis   . Hypertension   . Gastroesophageal reflux  disease   . COPD (chronic obstructive pulmonary disease) (HCC)   . CAD (coronary artery disease)     MI 2007, CABG  . Osteoporosis     per DEXA 05-2008  . Peripheral polyneuropathy (HCC)     Severe, causing problems with his feet, see surgeries  . Diabetes mellitus     Dx 07-2008 A1C 6.2  . Onychomycosis     toenails  . Hiatal hernia   . Esophageal dysmotility   . Gastroparesis   . Fatty liver 10/11/09  . Neuropathy (HCC)     (related to RA per neuro note 05-2011)  . Oxygen dependent     3l  . Complication of anesthesia     DIFFICULTY AFTER CABG HAD CONFUSION POST OP  . Myocardial infarction (HCC) 2007  . Shortness of breath   . Cataracts, bilateral   . Rheumatoid arthritis [facility])     [facility] Rheumatology    Past Surgical History  Procedure Laterality Date  . Foot surgery      to tendon release and repair of osteomyelitis   . Cholecystectomy    . Coronary artery bypass graft  2007    (LIMA to the LAD, left radial to obtuse marginal, SVG to first diagonal, SVG to PDA. His ejection fraction to 50-55%  . Achiles tendon surgery  8/09  . Toe amputation  4-12/ 3 14    d/t a  deformity, found to have osteomyelitis, complicated by post-op foot strtess FX  . Esophagogastroduodenoscopy N/A 10/19/2012    Procedure: ESOPHAGOGASTRODUODENOSCOPY (EGD);  Surgeon: Hart Carwin, MD;  Location: [facility] ENDOSCOPY;  Service: Endoscopy;  Laterality: N/A;  the dilatation is possible.   . Laryngoscopy  01/21/2014     DR BATES   . Laryngoscopy N/A 01/21/2014    Procedure: LARYNGOSCOPY;  Surgeon: Christia Reading, MD;  Location: [facility] OR;  Service: ENT;  Laterality: N/A;  micro direct laryngoscopy with prolaryn injection/jet venturi ventilation    Allergies  No Known Allergies  Home Medications    Prior to Admission medications   Medication Sig Start Date End Date Taking? Authorizing Provider  albuterol (PROVENTIL HFA;VENTOLIN HFA) 108 (90 BASE) MCG/ACT inhaler Inhale 2 puffs into the lungs every 4 (four)  hours as needed for wheezing or shortness of breath. 01/20/15   Tammy S Parrett, NP  amitriptyline (ELAVIL) 75 MG tablet Take 2 tablets at bedtime    Historical Provider, MD  Ascorbic Acid (VITAMIN C) 500 MG tablet Take 500 mg by mouth daily.      Historical Provider, MD  aspirin 81 MG tablet Take 81 mg by mouth daily.    Historical Provider, MD  atorvastatin (LIPITOR) 80 MG tablet TAKE 1 TABLET(80 MG) BY MOUTH DAILY 10/19/15   Rollene Rotunda, MD  B-D TB SYRINGE 1CC/27GX1/2" 27G X 1/2" 1 ML MISC Reported on 10/17/2015 02/18/15   Historical Provider, MD  Carbonyl Iron (PERFECT IRON PO) Take 1 tablet by mouth every morning.     Historical Provider, MD  clobetasol cream (TEMOVATE) 0.05 % Apply 1 application topically 2 (two) times daily. 10/19/15   Wanda Plump, MD  Fluticasone-Salmeterol (ADVAIR DISKUS) 100-50 MCG/DOSE AEPB Inhale 1 puff into the lungs 2 (two) times daily. 12/12/15   Tammy S Parrett, NP  folic acid (FOLVITE) 800 MCG tablet Take 800 mcg by mouth daily.    Historical Provider, MD  furosemide (LASIX) 20 MG tablet Take 2 tablets in the morning and 1 tablets at 4 pm 07/17/15   Rollene Rotunda, MD  glucose blood (ONE TOUCH ULTRA TEST) test strip Test blood sugar no more than twice daily. 02/13/15   Wanda Plump, MD  Loteprednol Etabonate [facility]) 0.5 % GEL daily 07/01/14   Historical Provider, MD  Methotrexate Sodium, PF, 50 MG/2ML SOLN Inject 25 mg into the skin. Once a week 06/23/14   Historical Provider, MD  metoprolol tartrate (LOPRESSOR) 25 MG tablet Take 1 tablet (25 mg total) by mouth 2 (two) times daily. 07/17/15   Rollene Rotunda, MD  Misc. Devices (ACAPELLA) MISC Use as directed 12/20/13   [name], MD  nitroGLYCERIN (NITROSTAT) 0.4 MG SL tablet Place 1 tablet (0.4 mg total) under the tongue every 5 (five) minutes as needed for chest pain. 11/15/13   Rollene Rotunda, MD  NON FORMULARY [place] PORTABLE PULSE DOSE OXYGEN oxygen 5L per nasal cannula 24 hours a day Dx: 496    Historical Provider,  MD  oxyCODONE-acetaminophen (PERCOCET) 7.5-325 MG per tablet Take 1 tablet by mouth 3 (three) times daily. 3 times a day    Historical Provider, MD  pantoprazole (PROTONIX) 40 MG tablet Take 1 tablet (40 mg total) by mouth 2 (two) times daily. 01/13/15   Hart Carwin, MD  potassium chloride (K-DUR,KLOR-CON) 10 MEQ tablet Take two (4) tablets (40 meq total) by mouth every morning and take one (2) tablet (20 meq total) by mouth every evening. 07/28/15   Rollene Rotunda,  MD  predniSONE (DELTASONE) 5 MG tablet 7 mg. Take as directed    Historical Provider, MD  Respiratory Therapy Supplies (FLUTTER) DEVI 1 Inhaler by Does not apply route 2 (two) times daily. 05/18/15   [name], MD  tiotropium (SPIRIVA HANDIHALER) 18 MCG inhalation capsule INHALE 1 CAPSULE BY MOUTH IN HANDIHALER DAILY 12/12/15   Tammy S Parrett, NP  vitamin B-12 (CYANOCOBALAMIN) 250 MCG tablet Take 250 mcg by mouth daily.      Historical Provider, MD   Family History    Family History  Problem Relation Age of Onset  . Heart disease Mother   . Diverticulitis Mother   . Heart disease Father   . Heart attack Father     x2  . Colon cancer Neg Hx   . Prostate cancer Neg Hx    Social History    Social History   Social History  . Marital Status: Married    Spouse Name: N/A  . Number of Children: 0  . Years of Education: N/A   Occupational History  . disabled-retired    Social History Main Topics  . Smoking status: Former Smoker -- 1.00 packs/day for 40 years    Quit date: 08/26/2005  . Smokeless tobacco: Former Neurosurgeon    Quit date: 04/02/2006     Comment: smoked x 40 years, 1 ppd  . Alcohol Use: No  . Drug Use: No  . Sexual Activity: Not on file   Other Topics Concern  . Not on file   Social History Narrative    Review of Systems    General:  No chills, fever, night sweats or weight changes.  Cardiovascular:  Positive for chest pain.  No dyspnea on exertion, edema, orthopnea, palpitations, paroxysmal nocturnal  dyspnea. Dermatological: No rash, lesions/masses Respiratory: He has a chronic, stable cough.  No dyspnea Urologic: No hematuria, dysuria Abdominal:   No nausea, vomiting, diarrhea, bright red blood per rectum, melena, or hematemesis Neurologic:  No visual changes, wkns, changes in mental status. All other systems reviewed and are otherwise negative except as noted above.  Physical Exam    Blood pressure 105/64, pulse 89, temperature 97.6 F (36.4 C), temperature source Oral, resp. rate 19, SpO2 100 %.  General: Pleasant, NAD, wearing [place] Psych: Normal affect. Neuro: Alert and oriented X 3. Moves all extremities spontaneously. HEENT: Normal  Neck: Supple without bruits or JVD. Lungs:  Decreased breath sounds bilateral lower lobes Heart: RRR no s3, s4, or murmurs. Abdomen: Soft, non-tender, non-distended, BS + x 4.  Extremities: No clubbing, cyanosis or edema. DP/PT/Radials 2+ and equal bilaterally.  Labs    Troponin (Point of Care Test) No results for input(s): TROPIPOC in the last 72 hours.  Recent Labs  01/19/16 1836  TROPONINI 0.06*   Lab Results  Component Value Date   WBC 5.7 01/19/2016   HGB 14.3 01/19/2016   HCT 42.0 01/19/2016   MCV 92.1 01/19/2016   PLT 178 01/19/2016    Recent Labs Lab 01/18/16 0906 01/19/16 1836  NA  --  139  K  --  3.6  CL  --  105  CO2  --  24  BUN  --  12  CREATININE 0.64 0.65  CALCIUM  --  9.4  ALT 24  --   AST 24  --   GLUCOSE  --  134*   Lab Results  Component Value Date   CHOL 136 07/11/2015   HDL 35.50* 07/11/2015   LDLCALC 71 07/11/2015  TRIG 146.0 07/11/2015   Lab Results  Component Value Date   DDIMER * 03/29/2008    8.43        AT THE INHOUSE ESTABLISHED CUTOFF VALUE OF 0.48 ug/mL FEU, THIS ASSAY HAS BEEN DOCUMENTED IN THE LITERATURE TO HAVE   Radiology Studies    Dg Chest 2 View  01/19/2016  CLINICAL DATA:  Left-sided chest pain for 2 days EXAM: CHEST  2 VIEW COMPARISON:  05/02/2015 FINDINGS: Cardiac  shadow is stable. Postoperative changes are again seen. Emphysematous changes are seen similar to that noted on prior CT examination. Nodular changes seen on the prior exam are not well appreciated on this study. No focal confluent infiltrate is seen. No bony abnormality is noted. IMPRESSION: Chronic emphysematous changes with scarring. No acute abnormality is seen. Electronically Signed   By: Alcide Clever M.D.   On: 01/19/2016 18:45   Ct Angio Chest Pe W/cm &/or Wo Cm  01/19/2016  CLINICAL DATA:  Left-sided chest pain EXAM: CT ANGIOGRAPHY CHEST WITH CONTRAST TECHNIQUE: Multidetector CT imaging of the chest was performed using the standard protocol during bolus administration of intravenous contrast. Multiplanar CT image reconstructions and MIPs were obtained to evaluate the vascular anatomy. CONTRAST:  100 mL Isovue 370. COMPARISON:  05/02/2015 FINDINGS: Severe emphysematous changes are identified and stable from the prior exam. These are concentrated predominately in the upper lobes bilaterally. Along the minor fissure there is some thickening associated with some of the emphysematous bulla this likely represents some progressive scarring. No focal infiltrate is seen. Previously seen small nodules are again identified stable in appearance from the prior exam. Thoracic inlet is within normal limits. The thoracic aorta and its branches demonstrates some calcific changes. Changes of prior coronary bypass grafting are noted. No aneurysmal dilatation or dissection. The pulmonary artery demonstrates a normal branching pattern. No filling defects to suggest pulmonary emboli are identified. No hilar or mediastinal adenopathy are noted. The visualized upper abdomen is within normal limits. The osseous structures show degenerative change of the thoracic spine. Review of the MIP images confirms the above findings. IMPRESSION: No evidence of pulmonary emboli. Stable small pulmonary nodules consistent with benign etiology.  Significant emphysematous changes. Some thickening is noted along 1 of the bulla in the right upper lobe likely related to progressive scarring. Electronically Signed   By: Alcide Clever M.D.   On: 01/19/2016 20:15   Assessment & Plan    65 yo M w a h/o CAD s/p CABG (2007, LIMA-LAD, left radial-OM, SVG-D1, SVG-PDA), DM with neuropathy, HTN, COPD on 3L [place], recurrent pneumonia secondary to chronic aspiration/reflux, RA, & Psoriasis is currently being transferred from [facility] where he presented atypical chest pain, found to have borderline tropin elevation.  # Chest pain with troponin elevation with a h/o 4vCABG (2007) - Though the pleurisy of his CP is unusual, it seemed to have a clear response to NTG.  Also, his borderline troponin elevation raises concern for a mild NSTEMI. - We applied NTG paste. - Will also initiate Heparin. - Monitor on telemetry with serial cardiac enzymes. - Based on further data, will determine whether cardiac catheterization is justified, obtaining a TTE in the meantime.   - Continue home ASA, Atorvastatin, & Metoprolol.  His need for Furosemide is unclear but is presumably due to HFpEF (EF 60% in 2012) - will continue for now.  # h/o DM - HgbA1c 6.2 06/2015.  He does not appear to be receiving therapy for this. - Monitor with daily  labs.  # h/o HTN - He is normotensive - Will continue his home Metoprolol.    # h/o COPD - Will continue his home inhaled corticosteroid-LABA & Tiotropium.  # h/o RA - Adequately controlled with home prednisone & Percocet.  He has already administered his weekly Mthotrexate on 01/18/16.    # PPX - Heparin gtt, Protonix.  # Full code  Signed, Julaine Hua, MD 01/20/2016, 1:03 AM

## 2016-01-19 NOTE — ED Notes (Signed)
[facility] here for transport at this time. 

## 2016-01-19 NOTE — ED Notes (Signed)
Pt c/o left side chest/ rib pain x 1 day pain increases with deep breathing

## 2016-01-19 NOTE — ED Provider Notes (Signed)
CSN: [id number]     Arrival date & time 01/19/16  1744 History  By signing my name below, I, Mesha Guinyard, attest that this documentation has been prepared under the direction and in the presence of Treatment Team:  Attending Provider: Lyndal Pulley, MD.  Electronically Signed: Arvilla Market, Medical Scribe. 01/19/2016. 7:11 PM.   Chief Complaint  Patient presents with  . Chest Pain   The history is provided by the patient.   HPI Comments: Joseph [name] is a 65 y.o. male whose had heart surgery in 2007 who presents to the Emergency Department complaining of sudden left chest pain onset yesterday afternoon. Pt states it began in his left arm and it radiated to his shoulder and left flank. Pt states he has COPD and he's on oxygen at home. Pt states he's never had this kind of pain before. He states that he is having associated symptoms of sharp pain when he breaths. He denies recent travel, surgery, trauma to site, or edema. Pt denies kidney problems, or PMHx of blood clots.  Past Medical History  Diagnosis Date  . Rheumatoid arthritis(714.0)   . Psoriasis   . Hypertension   . Gastroesophageal reflux disease   . COPD (chronic obstructive pulmonary disease) (HCC)   . CAD (coronary artery disease)     MI 2007, CABG  . Osteoporosis     per DEXA 05-2008  . Peripheral polyneuropathy (HCC)     Severe, causing problems with his feet, see surgeries  . Diabetes mellitus     Dx 07-2008 A1C 6.2  . Onychomycosis     toenails  . Hiatal hernia   . Esophageal dysmotility   . Gastroparesis   . Fatty liver 10/11/09  . Neuropathy (HCC)     (related to RA per neuro note 05-2011)  . Oxygen dependent     3l  . Complication of anesthesia     DIFFICULTY AFTER CABG HAD CONFUSION POST OP  . Myocardial infarction (HCC) 2007  . Shortness of breath   . Cataracts, bilateral   . Rheumatoid arthritis [facility])     [facility] Rheumatology   Past Surgical History  Procedure Laterality Date  . Foot surgery       to tendon release and repair of osteomyelitis   . Cholecystectomy    . Coronary artery bypass graft  2007    (LIMA to the LAD, left radial to obtuse marginal, SVG to first diagonal, SVG to PDA. His ejection fraction to 50-55%  . Achiles tendon surgery  8/09  . Toe amputation  4-12/ 3 14    d/t a deformity, found to have osteomyelitis, complicated by post-op foot strtess FX  . Esophagogastroduodenoscopy N/A 10/19/2012    Procedure: ESOPHAGOGASTRODUODENOSCOPY (EGD);  Surgeon: Hart Carwin, MD;  Location: [facility] ENDOSCOPY;  Service: Endoscopy;  Laterality: N/A;  the dilatation is possible.   . Laryngoscopy  01/21/2014     DR BATES   . Laryngoscopy N/A 01/21/2014    Procedure: LARYNGOSCOPY;  Surgeon: Christia Reading, MD;  Location: [facility] OR;  Service: ENT;  Laterality: N/A;  micro direct laryngoscopy with prolaryn injection/jet venturi ventilation   Family History  Problem Relation Age of Onset  . Heart disease Mother   . Diverticulitis Mother   . Heart disease Father   . Heart attack Father     x2  . Colon cancer Neg Hx   . Prostate cancer Neg Hx    Social History  Substance Use Topics  . Smoking status:  Former Smoker -- 1.00 packs/day for 40 years    Quit date: 08/26/2005  . Smokeless tobacco: Former Neurosurgeon    Quit date: 04/02/2006     Comment: smoked x 40 years, 1 ppd  . Alcohol Use: No    Review of Systems  Cardiovascular: Positive for chest pain. Negative for leg swelling.  Hematological: Negative.   All other systems reviewed and are negative.   Allergies  Review of patient's allergies indicates no known allergies.  Home Medications   Prior to Admission medications   Medication Sig Start Date End Date Taking? Authorizing Provider  albuterol (PROVENTIL HFA;VENTOLIN HFA) 108 (90 BASE) MCG/ACT inhaler Inhale 2 puffs into the lungs every 4 (four) hours as needed for wheezing or shortness of breath. 01/20/15   Tammy S Parrett, NP  amitriptyline (ELAVIL) 75 MG tablet Take 2 tablets at  bedtime    Historical Provider, MD  Ascorbic Acid (VITAMIN C) 500 MG tablet Take 500 mg by mouth daily.      Historical Provider, MD  aspirin 81 MG tablet Take 81 mg by mouth daily.    Historical Provider, MD  atorvastatin (LIPITOR) 80 MG tablet TAKE 1 TABLET(80 MG) BY MOUTH DAILY 10/19/15   Rollene Rotunda, MD  B-D TB SYRINGE 1CC/27GX1/2" 27G X 1/2" 1 ML MISC Reported on 10/17/2015 02/18/15   Historical Provider, MD  Carbonyl Iron (PERFECT IRON PO) Take 1 tablet by mouth every morning.     Historical Provider, MD  clobetasol cream (TEMOVATE) 0.05 % Apply 1 application topically 2 (two) times daily. 10/19/15   Wanda Plump, MD  Fluticasone-Salmeterol (ADVAIR DISKUS) 100-50 MCG/DOSE AEPB Inhale 1 puff into the lungs 2 (two) times daily. 12/12/15   Tammy S Parrett, NP  folic acid (FOLVITE) 800 MCG tablet Take 800 mcg by mouth daily.    Historical Provider, MD  furosemide (LASIX) 20 MG tablet Take 2 tablets in the morning and 1 tablets at 4 pm 07/17/15   Rollene Rotunda, MD  glucose blood (ONE TOUCH ULTRA TEST) test strip Test blood sugar no more than twice daily. 02/13/15   Wanda Plump, MD  Loteprednol Etabonate [facility]) 0.5 % GEL daily 07/01/14   Historical Provider, MD  Methotrexate Sodium, PF, 50 MG/2ML SOLN Inject 25 mg into the skin. Once a week 06/23/14   Historical Provider, MD  metoprolol tartrate (LOPRESSOR) 25 MG tablet Take 1 tablet (25 mg total) by mouth 2 (two) times daily. 07/17/15   Rollene Rotunda, MD  Misc. Devices (ACAPELLA) MISC Use as directed 12/20/13   [name], MD  nitroGLYCERIN (NITROSTAT) 0.4 MG SL tablet Place 1 tablet (0.4 mg total) under the tongue every 5 (five) minutes as needed for chest pain. 11/15/13   Rollene Rotunda, MD  NON FORMULARY [place]ERGO PORTABLE PULSE DOSE OXYGEN oxygen 5L per nasal cannula 24 hours a day Dx: 496    Historical Provider, MD  oxyCODONE-acetaminophen (PERCOCET) 7.5-325 MG per tablet Take 1 tablet by mouth 3 (three) times daily. 3 times a day    Historical  Provider, MD  pantoprazole (PROTONIX) 40 MG tablet Take 1 tablet (40 mg total) by mouth 2 (two) times daily. 01/13/15   Hart Carwin, MD  potassium chloride (K-DUR,KLOR-CON) 10 MEQ tablet Take two (4) tablets (40 meq total) by mouth every morning and take one (2) tablet (20 meq total) by mouth every evening. 07/28/15   Rollene Rotunda, MD  predniSONE (DELTASONE) 5 MG tablet 7 mg. Take as directed    Historical Provider,  MD  Respiratory Therapy Supplies (FLUTTER) DEVI 1 Inhaler by Does not apply route 2 (two) times daily. 05/18/15   [name], MD  tiotropium (SPIRIVA HANDIHALER) 18 MCG inhalation capsule INHALE 1 CAPSULE BY MOUTH IN HANDIHALER DAILY 12/12/15   Tammy S Parrett, NP  vitamin B-12 (CYANOCOBALAMIN) 250 MCG tablet Take 250 mcg by mouth daily.      Historical Provider, MD   BP 123/71 mmHg  Pulse 93  Temp(Src) 98.6 F (37 C) (Oral)  Resp 20  SpO2 98% Physical Exam  Constitutional: He is oriented to person, place, and time. He appears well-developed and well-nourished. No distress.  HENT:  Head: Normocephalic and atraumatic.  Eyes: Conjunctivae are normal.  Neck: Neck supple. No tracheal deviation present.  Cardiovascular: Normal rate, regular rhythm and normal heart sounds.  Exam reveals no gallop and no friction rub.   No murmur heard. Pulmonary/Chest: Effort normal and breath sounds normal. No respiratory distress. He has no wheezes. He has no rales.  Left side pleuritic chest pain No chest wall tenderness Clear to ausculation bilaterally  Abdominal: Soft. He exhibits no distension.  Neurological: He is alert and oriented to person, place, and time.  Skin: Skin is warm and dry.  No rashes on chest wall  Psychiatric: He has a normal mood and affect. His behavior is normal.  Nursing note and vitals reviewed.   ED Course  Procedures   CRITICAL CARE Performed by: Lyndal Pulley Total critical care time: 30 minutes Critical care time was exclusive of separately billable  procedures and treating other patients. Critical care was necessary to treat or prevent imminent or life-threatening deterioration. Critical care was time spent personally by me on the following activities: development of treatment plan with patient and/or surrogate as well as nursing, discussions with consultants, evaluation of patient's response to treatment, examination of patient, obtaining history from patient or surrogate, ordering and performing treatments and interventions, ordering and review of laboratory studies, ordering and review of radiographic studies, pulse oximetry and re-evaluation of patient's condition.   DIAGNOSTIC STUDIES: Oxygen Saturation is 98% on RA, NL by my interpretation.    COORDINATION OF CARE: 7:10 PM Discussed treatment plan with pt at bedside and pt agreed to plan.   Labs Review Labs Reviewed  BASIC METABOLIC PANEL - Abnormal; Notable for the following:    Glucose, Bld 134 (*)    All other components within normal limits  TROPONIN I - Abnormal; Notable for the following:    Troponin I 0.06 (*)    All other components within normal limits  CBC    Imaging Review Dg Chest 2 View  01/19/2016  CLINICAL DATA:  Left-sided chest pain for 2 days EXAM: CHEST  2 VIEW COMPARISON:  05/02/2015 FINDINGS: Cardiac shadow is stable. Postoperative changes are again seen. Emphysematous changes are seen similar to that noted on prior CT examination. Nodular changes seen on the prior exam are not well appreciated on this study. No focal confluent infiltrate is seen. No bony abnormality is noted. IMPRESSION: Chronic emphysematous changes with scarring. No acute abnormality is seen. Electronically Signed   By: Alcide Clever M.D.   On: 01/19/2016 18:45   Ct Angio Chest Pe W/cm &/or Wo Cm  01/19/2016  CLINICAL DATA:  Left-sided chest pain EXAM: CT ANGIOGRAPHY CHEST WITH CONTRAST TECHNIQUE: Multidetector CT imaging of the chest was performed using the standard protocol during bolus  administration of intravenous contrast. Multiplanar CT image reconstructions and MIPs were obtained to evaluate the vascular anatomy.  CONTRAST:  100 mL Isovue 370. COMPARISON:  05/02/2015 FINDINGS: Severe emphysematous changes are identified and stable from the prior exam. These are concentrated predominately in the upper lobes bilaterally. Along the minor fissure there is some thickening associated with some of the emphysematous bulla this likely represents some progressive scarring. No focal infiltrate is seen. Previously seen small nodules are again identified stable in appearance from the prior exam. Thoracic inlet is within normal limits. The thoracic aorta and its branches demonstrates some calcific changes. Changes of prior coronary bypass grafting are noted. No aneurysmal dilatation or dissection. The pulmonary artery demonstrates a normal branching pattern. No filling defects to suggest pulmonary emboli are identified. No hilar or mediastinal adenopathy are noted. The visualized upper abdomen is within normal limits. The osseous structures show degenerative change of the thoracic spine. Review of the MIP images confirms the above findings. IMPRESSION: No evidence of pulmonary emboli. Stable small pulmonary nodules consistent with benign etiology. Significant emphysematous changes. Some thickening is noted along 1 of the bulla in the right upper lobe likely related to progressive scarring. Electronically Signed   By: Alcide Clever M.D.   On: 01/19/2016 20:15   I have personally reviewed and evaluated these images and lab results as part of my medical decision-making.   EKG Interpretation   Date/Time:  Friday Jan 19 2016 17:57:19 EDT Ventricular Rate:  94 PR Interval:  154 QRS Duration: 74 QT Interval:  344 QTC Calculation: 430 R Axis:   52 Text Interpretation:  Sinus rhythm with frequent Premature ventricular  complexes Possible Left atrial enlargement Low voltage QRS Borderline ECG  Since  last tracing rate slower Otherwise no significant change Confirmed  by Geanna Divirgilio MD, Reuel Boom ([id number]) on 01/19/2016 6:00:18 PM      MDM   Final diagnoses:  Left sided chest pain  Troponin level elevated    65 y.o. male presents with Left lateral pleuritic appearing chest pain that occurred over the last day. He has multiple risk factors for ACS including 65 year old CABG vessels, diabetes, hypertension, prior MI. Screening EKG with increased ectopy from prior but otherwise unremarkable for signs of ischemia. Patient does have ongoing atypical chest pain. His troponin is positive which is new and in the setting of his high risk profile he will require transfer to a cardiac care Center for observation and further workup of chest pain. Considered PE in the differential given the nature of his pain but CT of the chest shows no evidence of emboli, rib fractures or other clinically relevant secondary cause of pain. I discussed the case with Dr. Sullivan Lone at [facility] who accepted the patient in transfer to their service for further workup and management and high-risk chest pain rule out. Patient was given aspirin for possible ACS pending further cardiology workup and management.  I personally performed the services described in this documentation, which was scribed in my presence. The recorded information has been reviewed and is accurate.    Lyndal Pulley, MD 01/19/16 2325

## 2016-01-19 NOTE — ED Notes (Signed)
Patient transported to X-ray 

## 2016-01-20 DIAGNOSIS — R7989 Other specified abnormal findings of blood chemistry: Secondary | ICD-10-CM | POA: Diagnosis not present

## 2016-01-20 DIAGNOSIS — R778 Other specified abnormalities of plasma proteins: Secondary | ICD-10-CM | POA: Insufficient documentation

## 2016-01-20 DIAGNOSIS — J438 Other emphysema: Secondary | ICD-10-CM | POA: Diagnosis not present

## 2016-01-20 DIAGNOSIS — I209 Angina pectoris, unspecified: Secondary | ICD-10-CM | POA: Diagnosis not present

## 2016-01-20 LAB — BASIC METABOLIC PANEL
ANION GAP: 8 (ref 5–15)
BUN: 9 mg/dL (ref 6–20)
CALCIUM: 8.9 mg/dL (ref 8.9–10.3)
CO2: 24 mmol/L (ref 22–32)
CREATININE: 0.7 mg/dL (ref 0.61–1.24)
Chloride: 105 mmol/L (ref 101–111)
Glucose, Bld: 164 mg/dL — ABNORMAL HIGH (ref 65–99)
Potassium: 3.4 mmol/L — ABNORMAL LOW (ref 3.5–5.1)
SODIUM: 137 mmol/L (ref 135–145)

## 2016-01-20 LAB — CBC WITH DIFFERENTIAL/PLATELET
BASOS ABS: 0 10*3/uL (ref 0.0–0.1)
Basophils Relative: 0 %
EOS ABS: 0.1 10*3/uL (ref 0.0–0.7)
EOS PCT: 2 %
HCT: 38.4 % — ABNORMAL LOW (ref 39.0–52.0)
HEMOGLOBIN: 13 g/dL (ref 13.0–17.0)
LYMPHS ABS: 1.9 10*3/uL (ref 0.7–4.0)
LYMPHS PCT: 34 %
MCH: 30.7 pg (ref 26.0–34.0)
MCHC: 33.9 g/dL (ref 30.0–36.0)
MCV: 90.8 fL (ref 78.0–100.0)
Monocytes Absolute: 0.7 10*3/uL (ref 0.1–1.0)
Monocytes Relative: 13 %
NEUTROS PCT: 51 %
Neutro Abs: 2.8 10*3/uL (ref 1.7–7.7)
PLATELETS: 152 10*3/uL (ref 150–400)
RBC: 4.23 MIL/uL (ref 4.22–5.81)
RDW: 14.3 % (ref 11.5–15.5)
WBC: 5.6 10*3/uL (ref 4.0–10.5)

## 2016-01-20 LAB — PROTIME-INR
INR: 1.28 (ref 0.00–1.49)
PROTHROMBIN TIME: 16.2 s — AB (ref 11.6–15.2)

## 2016-01-20 LAB — MAGNESIUM: MAGNESIUM: 1.9 mg/dL (ref 1.7–2.4)

## 2016-01-20 LAB — TROPONIN I
TROPONIN I: 0.07 ng/mL — AB (ref <0.031)
Troponin I: 0.07 ng/mL — ABNORMAL HIGH (ref <0.031)
Troponin I: 0.07 ng/mL — ABNORMAL HIGH (ref <0.031)

## 2016-01-20 LAB — BRAIN NATRIURETIC PEPTIDE: B NATRIURETIC PEPTIDE 5: 51.7 pg/mL (ref 0.0–100.0)

## 2016-01-20 LAB — HEPARIN LEVEL (UNFRACTIONATED)
HEPARIN UNFRACTIONATED: 0.5 [IU]/mL (ref 0.30–0.70)
Heparin Unfractionated: 0.28 IU/mL — ABNORMAL LOW (ref 0.30–0.70)

## 2016-01-20 MED ORDER — HEPARIN BOLUS VIA INFUSION
4000.0000 [IU] | Freq: Once | INTRAVENOUS | Status: AC
  Administered 2016-01-20: 4000 [IU] via INTRAVENOUS
  Filled 2016-01-20: qty 4000

## 2016-01-20 MED ORDER — SODIUM CHLORIDE 0.9 % WEIGHT BASED INFUSION
3.0000 mL/kg/h | INTRAVENOUS | Status: DC
  Administered 2016-01-23: 3 mL/kg/h via INTRAVENOUS

## 2016-01-20 MED ORDER — NITROGLYCERIN 0.4 MG SL SUBL
SUBLINGUAL_TABLET | SUBLINGUAL | Status: AC
  Filled 2016-01-20: qty 1

## 2016-01-20 MED ORDER — FUROSEMIDE 40 MG PO TABS
40.0000 mg | ORAL_TABLET | Freq: Every day | ORAL | Status: DC
  Administered 2016-01-20 – 2016-01-23 (×4): 40 mg via ORAL
  Filled 2016-01-20 (×4): qty 1

## 2016-01-20 MED ORDER — OXYCODONE-ACETAMINOPHEN 7.5-325 MG PO TABS
1.0000 | ORAL_TABLET | Freq: Three times a day (TID) | ORAL | Status: DC | PRN
  Administered 2016-01-20 – 2016-01-22 (×6): 1 via ORAL
  Filled 2016-01-20 (×6): qty 1

## 2016-01-20 MED ORDER — ASPIRIN 81 MG PO CHEW: 81.0000 mg | CHEWABLE_TABLET | ORAL | Status: DC

## 2016-01-20 MED ORDER — METOPROLOL TARTRATE 25 MG PO TABS
25.0000 mg | ORAL_TABLET | Freq: Two times a day (BID) | ORAL | Status: DC
  Administered 2016-01-20 – 2016-01-23 (×8): 25 mg via ORAL
  Filled 2016-01-20 (×8): qty 1

## 2016-01-20 MED ORDER — PANTOPRAZOLE SODIUM 40 MG PO TBEC
40.0000 mg | DELAYED_RELEASE_TABLET | Freq: Two times a day (BID) | ORAL | Status: DC
  Administered 2016-01-20 – 2016-01-23 (×8): 40 mg via ORAL
  Filled 2016-01-20 (×8): qty 1

## 2016-01-20 MED ORDER — ASPIRIN 81 MG PO CHEW
81.0000 mg | CHEWABLE_TABLET | ORAL | Status: AC
  Administered 2016-01-23: 81 mg via ORAL
  Filled 2016-01-20: qty 1

## 2016-01-20 MED ORDER — SODIUM CHLORIDE 0.9% FLUSH: 3.0000 mL | Freq: Two times a day (BID) | INTRAVENOUS | Status: DC

## 2016-01-20 MED ORDER — AMITRIPTYLINE HCL 25 MG PO TABS
150.0000 mg | ORAL_TABLET | Freq: Every day | ORAL | Status: DC
  Administered 2016-01-20 – 2016-01-22 (×4): 150 mg via ORAL
  Filled 2016-01-20 (×4): qty 6

## 2016-01-20 MED ORDER — ASPIRIN EC 81 MG PO TBEC
81.0000 mg | DELAYED_RELEASE_TABLET | Freq: Every day | ORAL | Status: DC
  Administered 2016-01-20 – 2016-01-22 (×3): 81 mg via ORAL
  Filled 2016-01-20 (×4): qty 1

## 2016-01-20 MED ORDER — SODIUM CHLORIDE 0.9% FLUSH
3.0000 mL | INTRAVENOUS | Status: DC | PRN
  Administered 2016-01-21: 3 mL via INTRAVENOUS
  Filled 2016-01-20: qty 3

## 2016-01-20 MED ORDER — POTASSIUM CHLORIDE CRYS ER 20 MEQ PO TBCR
40.0000 meq | EXTENDED_RELEASE_TABLET | Freq: Once | ORAL | Status: AC
  Administered 2016-01-20: 40 meq via ORAL
  Filled 2016-01-20: qty 2

## 2016-01-20 MED ORDER — MOMETASONE FURO-FORMOTEROL FUM 100-5 MCG/ACT IN AERO
2.0000 | INHALATION_SPRAY | Freq: Two times a day (BID) | RESPIRATORY_TRACT | Status: DC
  Administered 2016-01-20 – 2016-01-22 (×6): 2 via RESPIRATORY_TRACT
  Filled 2016-01-20 (×2): qty 8.8

## 2016-01-20 MED ORDER — SODIUM CHLORIDE 0.9% FLUSH: 3.0000 mL | INTRAVENOUS | Status: DC | PRN

## 2016-01-20 MED ORDER — PREDNISONE 5 MG PO TABS
5.0000 mg | ORAL_TABLET | Freq: Every day | ORAL | Status: DC
  Administered 2016-01-20 – 2016-01-23 (×4): 5 mg via ORAL
  Filled 2016-01-20 (×4): qty 1

## 2016-01-20 MED ORDER — FUROSEMIDE 20 MG PO TABS
20.0000 mg | ORAL_TABLET | Freq: Every evening | ORAL | Status: DC
  Administered 2016-01-20 – 2016-01-22 (×3): 20 mg via ORAL
  Filled 2016-01-20 (×3): qty 1

## 2016-01-20 MED ORDER — VITAMIN C 500 MG PO TABS
500.0000 mg | ORAL_TABLET | Freq: Every day | ORAL | Status: DC
  Administered 2016-01-20 – 2016-01-23 (×4): 500 mg via ORAL
  Filled 2016-01-20 (×4): qty 1

## 2016-01-20 MED ORDER — LOTEPREDNOL ETABONATE 0.5 % OP SUSP
1.0000 [drp] | Freq: Every morning | OPHTHALMIC | Status: DC
  Administered 2016-01-23: 1 [drp] via OPHTHALMIC
  Filled 2016-01-20: qty 5

## 2016-01-20 MED ORDER — ASPIRIN EC 81 MG PO TBEC
81.0000 mg | DELAYED_RELEASE_TABLET | Freq: Every day | ORAL | Status: DC
Start: 2016-01-20 — End: 2016-01-20

## 2016-01-20 MED ORDER — ATORVASTATIN CALCIUM 80 MG PO TABS
80.0000 mg | ORAL_TABLET | Freq: Every day | ORAL | Status: DC
  Administered 2016-01-20 – 2016-01-22 (×4): 80 mg via ORAL
  Filled 2016-01-20 (×4): qty 1

## 2016-01-20 MED ORDER — SODIUM CHLORIDE 0.9% FLUSH
3.0000 mL | Freq: Two times a day (BID) | INTRAVENOUS | Status: DC
  Administered 2016-01-20: 3 mL via INTRAVENOUS

## 2016-01-20 MED ORDER — HEPARIN (PORCINE) IN NACL 100-0.45 UNIT/ML-% IJ SOLN
1500.0000 [IU]/h | INTRAMUSCULAR | Status: DC
  Administered 2016-01-20: 1350 [IU]/h via INTRAVENOUS
  Administered 2016-01-20 – 2016-01-21 (×2): 1500 [IU]/h via INTRAVENOUS
  Filled 2016-01-20 (×3): qty 250

## 2016-01-20 MED ORDER — NITROGLYCERIN 0.4 MG SL SUBL
0.4000 mg | SUBLINGUAL_TABLET | SUBLINGUAL | Status: DC | PRN
  Administered 2016-01-20 (×2): 0.4 mg via SUBLINGUAL

## 2016-01-20 MED ORDER — ALBUTEROL SULFATE (2.5 MG/3ML) 0.083% IN NEBU: 2.5000 mg | INHALATION_SOLUTION | RESPIRATORY_TRACT | Status: DC | PRN

## 2016-01-20 MED ORDER — VITAMIN B-12 1000 MCG PO TABS
50.0000 ug | ORAL_TABLET | Freq: Every day | ORAL | Status: DC
  Administered 2016-01-20 – 2016-01-23 (×4): 500 ug via ORAL
  Filled 2016-01-20 (×4): qty 1

## 2016-01-20 MED ORDER — SODIUM CHLORIDE 0.9 % IV SOLN: 250.0000 mL | INTRAVENOUS | Status: DC | PRN

## 2016-01-20 MED ORDER — POLYSACCHARIDE IRON COMPLEX 150 MG PO CAPS
150.0000 mg | ORAL_CAPSULE | Freq: Every day | ORAL | Status: DC
Start: 2016-01-20 — End: 2016-01-23
  Administered 2016-01-20 – 2016-01-22 (×3): 150 mg via ORAL
  Filled 2016-01-20 (×3): qty 1

## 2016-01-20 MED ORDER — FOLIC ACID 1 MG PO TABS
1.0000 mg | ORAL_TABLET | Freq: Every day | ORAL | Status: DC
  Administered 2016-01-20 – 2016-01-23 (×4): 1 mg via ORAL
  Filled 2016-01-20 (×4): qty 1

## 2016-01-20 MED ORDER — SODIUM CHLORIDE 0.9 % WEIGHT BASED INFUSION: 1.0000 mL/kg/h | INTRAVENOUS | Status: DC

## 2016-01-20 MED ORDER — NITROGLYCERIN 2 % TD OINT
1.0000 [in_us] | TOPICAL_OINTMENT | Freq: Four times a day (QID) | TRANSDERMAL | Status: DC
  Administered 2016-01-20 – 2016-01-23 (×14): 1 [in_us] via TOPICAL
  Filled 2016-01-20: qty 30

## 2016-01-20 MED ORDER — POTASSIUM CHLORIDE ER 10 MEQ PO TBCR
40.0000 meq | EXTENDED_RELEASE_TABLET | Freq: Once | ORAL | Status: DC
  Filled 2016-01-20: qty 4

## 2016-01-20 MED ORDER — TIOTROPIUM BROMIDE MONOHYDRATE 18 MCG IN CAPS
18.0000 ug | ORAL_CAPSULE | Freq: Every day | RESPIRATORY_TRACT | Status: DC
  Administered 2016-01-20 – 2016-01-22 (×3): 18 ug via RESPIRATORY_TRACT
  Filled 2016-01-20 (×2): qty 5

## 2016-01-20 NOTE — Progress Notes (Signed)
ANTICOAGULATION CONSULT NOTE - Follow Up Consult  Pharmacy Consult for heparin Indication: chest pain/ACS  No Known Allergies  Patient Measurements: Height: 6' (182.9 cm) Weight: 218 lb (98.884 kg) IBW/kg (Calculated) : 77.6 Heparin Dosing Weight:  Vital Signs: Temp: 98 F (36.7 C) (05/27 0925) Temp Source: Oral (05/27 0925) BP: 140/60 mmHg (05/27 0925) Pulse Rate: 81 (05/27 0925)  Labs:  Recent Labs  01/18/16 0906 01/19/16 1836 01/20/16 0309 01/20/16 0647  HGB 14.1 14.3 13.0  --   HCT 41.2 42.0 38.4*  --   PLT 170.0 178 152  --   LABPROT  --   --  16.2*  --   INR  --   --  1.28  --   HEPARINUNFRC  --   --   --  0.28*  CREATININE 0.64 0.65 0.70  --   TROPONINI  --  0.06* 0.07* 0.07*    Estimated Creatinine Clearance: 115.1 mL/min (by C-G formula based on Cr of 0.7).  Assessment:  65yo male with history of CAD presents with CP. Pharmacy is consulted to dose heparin for ACS/chest pain. Initial HL sub therapeutic at 0.28, CBC stable.   Goal of Therapy:  Heparin level 0.3-0.7 units/ml Monitor platelets by anticoagulation protocol: Yes   Plan:  Inc heparin gtt to 1500 units/hr 6 hr HL Daily HL, CBC Monitor for S&S of bleed  Sandi Carne, PharmD Pharmacy Resident Pager: [phone] 01/20/2016,10:38 AM

## 2016-01-20 NOTE — Progress Notes (Signed)
ANTICOAGULATION CONSULT NOTE - Initial Consult  Pharmacy Consult for Heparin Indication: chest pain/ACS  No Known Allergies  Patient Measurements:   Heparin Dosing Weight: 99 kg  Vital Signs: Temp: 97.6 F (36.4 C) (05/26 2230) Temp Source: Oral (05/26 2230) BP: 105/64 mmHg (05/27 0015) Pulse Rate: 89 (05/26 2230)  Labs:  Recent Labs  01/18/16 0906 01/19/16 1836  HGB 14.1 14.3  HCT 41.2 42.0  PLT 170.0 178  CREATININE 0.64 0.65  TROPONINI  --  0.06*    CrCl cannot be calculated (Unknown ideal weight.).   Medical History: Past Medical History  Diagnosis Date  . Rheumatoid arthritis(714.0)   . Psoriasis   . Hypertension   . Gastroesophageal reflux disease   . COPD (chronic obstructive pulmonary disease) (HCC)   . CAD (coronary artery disease)     MI 2007, CABG  . Osteoporosis     per DEXA 05-2008  . Peripheral polyneuropathy (HCC)     Severe, causing problems with his feet, see surgeries  . Diabetes mellitus     Dx 07-2008 A1C 6.2  . Onychomycosis     toenails  . Hiatal hernia   . Esophageal dysmotility   . Gastroparesis   . Fatty liver 10/11/09  . Neuropathy (HCC)     (related to RA per neuro note 05-2011)  . Oxygen dependent     3l  . Complication of anesthesia     DIFFICULTY AFTER CABG HAD CONFUSION POST OP  . Myocardial infarction (HCC) 2007  . Shortness of breath   . Cataracts, bilateral   . Rheumatoid arthritis (HCC)     [facility] Rheumatology    Medications:  Prescriptions prior to admission  Medication Sig Dispense Refill Last Dose  . albuterol (PROVENTIL HFA;VENTOLIN HFA) 108 (90 BASE) MCG/ACT inhaler Inhale 2 puffs into the lungs every 4 (four) hours as needed for wheezing or shortness of breath. 1 Inhaler 6 Taking  . amitriptyline (ELAVIL) 75 MG tablet Take 2 tablets at bedtime   Taking  . Ascorbic Acid (VITAMIN C) 500 MG tablet Take 500 mg by mouth daily.     Taking  . aspirin 81 MG tablet Take 81 mg by mouth daily.   Taking  .  atorvastatin (LIPITOR) 80 MG tablet TAKE 1 TABLET(80 MG) BY MOUTH DAILY 90 tablet 2 Taking  . B-D TB SYRINGE 1CC/27GX1/2" 27G X 1/2" 1 ML MISC Reported on 10/17/2015   Taking  . Carbonyl Iron (PERFECT IRON PO) Take 1 tablet by mouth every morning.    Taking  . clobetasol cream (TEMOVATE) 0.05 % Apply 1 application topically 2 (two) times daily.   Taking  . Fluticasone-Salmeterol (ADVAIR DISKUS) 100-50 MCG/DOSE AEPB Inhale 1 puff into the lungs 2 (two) times daily. 60 each 5   . folic acid (FOLVITE) 800 MCG tablet Take 800 mcg by mouth daily.   Taking  . furosemide (LASIX) 20 MG tablet Take 2 tablets in the morning and 1 tablets at 4 pm 270 tablet 3 Taking  . glucose blood (ONE TOUCH ULTRA TEST) test strip Test blood sugar no more than twice daily. 100 each 12 Taking  . Loteprednol Etabonate (LOTEMAX) 0.5 % GEL daily   Taking  . Methotrexate Sodium, PF, 50 MG/2ML SOLN Inject 25 mg into the skin. Once a week   Taking  . metoprolol tartrate (LOPRESSOR) 25 MG tablet Take 1 tablet (25 mg total) by mouth 2 (two) times daily. 180 tablet 3 Taking  . Misc. Devices (ACAPELLA) MISC Use as  directed 1 each 0 Taking  . nitroGLYCERIN (NITROSTAT) 0.4 MG SL tablet Place 1 tablet (0.4 mg total) under the tongue every 5 (five) minutes as needed for chest pain. 25 tablet 1 Taking  . NON FORMULARY [place] PORTABLE PULSE DOSE OXYGEN oxygen 5L per nasal cannula 24 hours a day Dx: 496   Taking  . oxyCODONE-acetaminophen (PERCOCET) 7.5-325 MG per tablet Take 1 tablet by mouth 3 (three) times daily. 3 times a day   Taking  . pantoprazole (PROTONIX) 40 MG tablet Take 1 tablet (40 mg total) by mouth 2 (two) times daily. 180 tablet 3 Taking  . potassium chloride (K-DUR,KLOR-CON) 10 MEQ tablet Take two (4) tablets (40 meq total) by mouth every morning and take one (2) tablet (20 meq total) by mouth every evening. 180 tablet 11 Taking  . predniSONE (DELTASONE) 5 MG tablet 7 mg. Take as directed   Taking  . Respiratory Therapy  Supplies (FLUTTER) DEVI 1 Inhaler by Does not apply route 2 (two) times daily. 1 each 0 Taking  . tiotropium (SPIRIVA HANDIHALER) 18 MCG inhalation capsule INHALE 1 CAPSULE BY MOUTH IN HANDIHALER DAILY 30 capsule 5   . vitamin B-12 (CYANOCOBALAMIN) 250 MCG tablet Take 250 mcg by mouth daily.     Taking   Scheduled:  . amitriptyline  150 mg Oral QHS  . aspirin EC  81 mg Oral Daily  . atorvastatin  80 mg Oral q1800  . folic acid  1 mg Oral Daily  . furosemide  40 mg Oral Daily  . iron polysaccharides  150 mg Oral Q breakfast  . loteprednol  1 drop Both Eyes q morning - 10a  . metoprolol tartrate  25 mg Oral BID  . mometasone-formoterol  2 puff Inhalation BID  . nitroGLYCERIN  1 inch Topical Q6H  . pantoprazole  40 mg Oral BID  . predniSONE  5 mg Oral Q breakfast  . tiotropium  18 mcg Inhalation Daily  . vitamin B-12  500 mcg Oral Daily  . vitamin C  500 mg Oral Daily   Infusions:    Assessment: 65yo male with history of CAD presents with CP. Pharmacy is consulted to dose heparin for ACS/chest pain.   Goal of Therapy:  Heparin level 0.3-0.7 units/ml Monitor platelets by anticoagulation protocol: Yes   Plan:  Give 4000 units bolus x 1 Start heparin infusion at 1350 units/hr Check anti-Xa level in 6 hours and daily while on heparin Continue to monitor H&H and platelets  Arlean Hopping. Newman Pies, PharmD, BCPS Clinical Pharmacist Pager [phone] 01/20/2016,1:06 AM

## 2016-01-20 NOTE — Progress Notes (Addendum)
Patient ID: Joseph [name], male   DOB: 1951/04/28, 65 y.o.   MRN: [id number]    Subjective:  Admitted by fellow yesterday 5/26 Chest pain resolved with nitro paste  Objective:  Filed Vitals:   01/20/16 0010 01/20/16 0015 01/20/16 0500 01/20/16 0925  BP: 119/72 105/64 111/78 140/60  Pulse:   98 81  Temp:   98.7 F (37.1 C) 98 F (36.7 C)  TempSrc:   Oral Oral  Resp:   18   Height:      Weight:   98.884 kg (218 lb)   SpO2:   98% 97%    Intake/Output from previous day: No intake or output data in the 24 hours ending 01/20/16 0933  Physical Exam: Affect appropriate Chronically ill white male  HEENT: normal Neck supple with no adenopathy JVP normal no bruits no thyromegaly Lungs clear with no wheezing and good diaphragmatic motion Heart:  S1/S2 no murmur, no rub, gallop or click Previous sternotomy PMI normal Abdomen: benighn, BS positve, no tenderness, no AAA no bruit.  No HSM or HJR Distal pulses intact with no bruits S/P left radial harvest No edema Neuro non-focal Skin warm and dry Right hand contracture from RA.    Lab Results: Basic Metabolic Panel:  Recent Labs  35/32/99 1836 01/20/16 0309  NA 139 137  K 3.6 3.4*  CL 105 105  CO2 24 24  GLUCOSE 134* 164*  BUN 12 9  CREATININE 0.65 0.70  CALCIUM 9.4 8.9  MG  --  1.9   Liver Function Tests:  Recent Labs  01/18/16 0906  AST 24  ALT 24  ALBUMIN 4.0   CBC:  Recent Labs  01/19/16 1836 01/20/16 0309  WBC 5.7 5.6  NEUTROABS  --  2.8  HGB 14.3 13.0  HCT 42.0 38.4*  MCV 92.1 90.8  PLT 178 152   Cardiac Enzymes:  Recent Labs  01/19/16 1836 01/20/16 0309  TROPONINI 0.06* 0.07*    Imaging: Dg Chest 2 View  01/19/2016  CLINICAL DATA:  Left-sided chest pain for 2 days EXAM: CHEST  2 VIEW COMPARISON:  05/02/2015 FINDINGS: Cardiac shadow is stable. Postoperative changes are again seen. Emphysematous changes are seen similar to that noted on prior CT examination. Nodular changes seen on  the prior exam are not well appreciated on this study. No focal confluent infiltrate is seen. No bony abnormality is noted. IMPRESSION: Chronic emphysematous changes with scarring. No acute abnormality is seen. Electronically Signed   By: Alcide Clever M.D.   On: 01/19/2016 18:45   Ct Angio Chest Pe W/cm &/or Wo Cm  01/19/2016  CLINICAL DATA:  Left-sided chest pain EXAM: CT ANGIOGRAPHY CHEST WITH CONTRAST TECHNIQUE: Multidetector CT imaging of the chest was performed using the standard protocol during bolus administration of intravenous contrast. Multiplanar CT image reconstructions and MIPs were obtained to evaluate the vascular anatomy. CONTRAST:  100 mL Isovue 370. COMPARISON:  05/02/2015 FINDINGS: Severe emphysematous changes are identified and stable from the prior exam. These are concentrated predominately in the upper lobes bilaterally. Along the minor fissure there is some thickening associated with some of the emphysematous bulla this likely represents some progressive scarring. No focal infiltrate is seen. Previously seen small nodules are again identified stable in appearance from the prior exam. Thoracic inlet is within normal limits. The thoracic aorta and its branches demonstrates some calcific changes. Changes of prior coronary bypass grafting are noted. No aneurysmal dilatation or dissection. The pulmonary artery demonstrates a normal branching pattern. No  filling defects to suggest pulmonary emboli are identified. No hilar or mediastinal adenopathy are noted. The visualized upper abdomen is within normal limits. The osseous structures show degenerative change of the thoracic spine. Review of the MIP images confirms the above findings. IMPRESSION: No evidence of pulmonary emboli. Stable small pulmonary nodules consistent with benign etiology. Significant emphysematous changes. Some thickening is noted along 1 of the bulla in the right upper lobe likely related to progressive scarring.  Electronically Signed   By: Alcide Clever M.D.   On: 01/19/2016 20:15    Cardiac Studies:  ECG: SR PVC rate 94 no acute changes    Telemetry:  NSR no arrhythmia  Echo: pending   Medications:   . amitriptyline  150 mg Oral QHS  . aspirin EC  81 mg Oral Daily  . atorvastatin  80 mg Oral q1800  . folic acid  1 mg Oral Daily  . furosemide  20 mg Oral QPM  . furosemide  40 mg Oral Daily  . iron polysaccharides  150 mg Oral Q breakfast  . loteprednol  1 drop Both Eyes q morning - 10a  . metoprolol tartrate  25 mg Oral BID  . mometasone-formoterol  2 puff Inhalation BID  . nitroGLYCERIN  1 inch Topical Q6H  . pantoprazole  40 mg Oral BID  . predniSONE  5 mg Oral Q breakfast  . sodium chloride flush  3 mL Intravenous Q12H  . tiotropium  18 mcg Inhalation Daily  . vitamin B-12  500 mcg Oral Daily  . vitamin C  500 mg Oral Daily     . 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  Pain responsive to nitro Discussed with patient and wife.  Favor cath on Tuesday Will continue with heparin. Transition to iv nitro if any more chest pain.  Cath will be from right groin as he has bypasses and is post left radial harvest  Start lopressor 25 bid given relative tachycardia and PvC on admission  RA:  Has had methotrexate shot this week right hand contracture  COPD:  No active wheezing CXR NAD on low dose prednisone for RA  Charlton Haws 01/20/2016, 9:33 AM

## 2016-01-20 NOTE — Progress Notes (Signed)
ANTICOAGULATION CONSULT NOTE - Follow Up Consult  Pharmacy Consult for heparin Indication: chest pain/ACS  No Known Allergies  Patient Measurements: Height: 6' (182.9 cm) Weight: 218 lb (98.884 kg) IBW/kg (Calculated) : 77.6 Heparin Dosing Weight:  Vital Signs: Temp: 98 F (36.7 C) (05/27 0925) Temp Source: Oral (05/27 0925) BP: 101/60 mmHg (05/27 1555) Pulse Rate: 81 (05/27 1036)  Labs:  Recent Labs  01/18/16 0906  01/19/16 1836 01/20/16 0309 01/20/16 0647 01/20/16 1236 01/20/16 1636  HGB 14.1  --  14.3 13.0  --   --   --   HCT 41.2  --  42.0 38.4*  --   --   --   PLT 170.0  --  178 152  --   --   --   LABPROT  --   --   --  16.2*  --   --   --   INR  --   --   --  1.28  --   --   --   HEPARINUNFRC  --   --   --   --  0.28*  --  0.50  CREATININE 0.64  --  0.65 0.70  --   --   --   TROPONINI  --   < > 0.06* 0.07* 0.07* 0.07*  --   < > = values in this interval not displayed.  Estimated Creatinine Clearance: 115.1 mL/min (by C-G formula based on Cr of 0.7).  Assessment:  65yo male with history of CAD presents with CP. Pharmacy is consulted to dose heparin for ACS/chest pain. Initial HL sub therapeutic at 0.28, CBC stable.  Repeat HL therapeutic at 0.5  Goal of Therapy:  Heparin level 0.3-0.7 units/ml Monitor platelets by anticoagulation protocol: Yes   Plan:  Continue heparin gtt to 1500 units/hr Daily HL, CBC Monitor for S&S of bleed  Isaac Bliss, PharmD, BCPS, [facility] Clinical Pharmacist Pager [phone] 01/20/2016 5:19 PM

## 2016-01-21 ENCOUNTER — Inpatient Hospital Stay (HOSPITAL_COMMUNITY): Payer: BC Managed Care – PPO

## 2016-01-21 DIAGNOSIS — R079 Chest pain, unspecified: Secondary | ICD-10-CM | POA: Diagnosis not present

## 2016-01-21 DIAGNOSIS — R7989 Other specified abnormal findings of blood chemistry: Secondary | ICD-10-CM | POA: Diagnosis not present

## 2016-01-21 DIAGNOSIS — R072 Precordial pain: Secondary | ICD-10-CM | POA: Diagnosis not present

## 2016-01-21 DIAGNOSIS — I309 Acute pericarditis, unspecified: Secondary | ICD-10-CM

## 2016-01-21 DIAGNOSIS — J438 Other emphysema: Secondary | ICD-10-CM | POA: Diagnosis not present

## 2016-01-21 DIAGNOSIS — I10 Essential (primary) hypertension: Secondary | ICD-10-CM | POA: Diagnosis not present

## 2016-01-21 LAB — CBC
HCT: 41 % (ref 39.0–52.0)
Hemoglobin: 13.4 g/dL (ref 13.0–17.0)
MCH: 29.6 pg (ref 26.0–34.0)
MCHC: 32.7 g/dL (ref 30.0–36.0)
MCV: 90.7 fL (ref 78.0–100.0)
Platelets: 169 K/uL (ref 150–400)
RBC: 4.52 MIL/uL (ref 4.22–5.81)
RDW: 14.4 % (ref 11.5–15.5)
WBC: 5.5 K/uL (ref 4.0–10.5)

## 2016-01-21 LAB — ECHOCARDIOGRAM COMPLETE
HEIGHTINCHES: 72 in
Weight: 3459.2 oz

## 2016-01-21 LAB — HEPARIN LEVEL (UNFRACTIONATED): Heparin Unfractionated: 0.6 [IU]/mL (ref 0.30–0.70)

## 2016-01-21 MED ORDER — COLCHICINE 0.6 MG PO TABS
0.6000 mg | ORAL_TABLET | Freq: Two times a day (BID) | ORAL | Status: DC
  Administered 2016-01-21 – 2016-01-23 (×5): 0.6 mg via ORAL
  Filled 2016-01-21 (×5): qty 1

## 2016-01-21 MED ORDER — IBUPROFEN 200 MG PO TABS
400.0000 mg | ORAL_TABLET | Freq: Two times a day (BID) | ORAL | Status: DC
  Administered 2016-01-21 – 2016-01-23 (×5): 400 mg via ORAL
  Filled 2016-01-21 (×5): qty 2

## 2016-01-21 MED ORDER — SODIUM CHLORIDE 0.9% FLUSH
3.0000 mL | Freq: Two times a day (BID) | INTRAVENOUS | Status: DC
  Administered 2016-01-21 – 2016-01-22 (×3): 3 mL via INTRAVENOUS

## 2016-01-21 NOTE — Progress Notes (Signed)
Patient ID: Joseph [name], male   DOB: 03/04/1951, 65 y.o.   MRN: [id number]    Subjective:  The patient states that he has constant mild chest pain that's worse every time he takes a deep breath. It hasn't changed with nitroglycerin. It all started about a week ago.   Objective:  Filed Vitals:   01/20/16 2100 01/20/16 2227 01/21/16 0500 01/21/16 0851  BP: 130/74 130/74 100/74 130/74  Pulse: 92 100 94 93  Temp: 97.9 F (36.6 C)  97.7 F (36.5 C)   TempSrc: Oral  Oral   Resp:      Height:      Weight:   216 lb 3.2 oz (98.068 kg)   SpO2: 99%  96%     Intake/Output from previous day:  Intake/Output Summary (Last 24 hours) at 01/21/16 1138 Last data filed at 01/21/16 0000  Gross per 24 hour  Intake 318.18 ml  Output      0 ml  Net 318.18 ml    Physical Exam: Affect appropriate Chronically ill white male  HEENT: normal Neck supple with no adenopathy JVP normal no bruits no thyromegaly Lungs clear with no wheezing and good diaphragmatic motion Heart:  S1/S2 no murmur, no rub, gallop or click Previous sternotomy PMI normal Abdomen: benighn, BS positve, no tenderness, no AAA no bruit.  No HSM or HJR Distal pulses intact with no bruits S/P left radial harvest No edema Neuro non-focal Skin warm and dry Right hand contracture from RA.  Lab Results: Basic Metabolic Panel:  Recent Labs  40/98/11 1836 01/20/16 0309  NA 139 137  K 3.6 3.4*  CL 105 105  CO2 24 24  GLUCOSE 134* 164*  BUN 12 9  CREATININE 0.65 0.70  CALCIUM 9.4 8.9  MG  --  1.9    Recent Labs  01/20/16 0309 01/21/16 0330  WBC 5.6 5.5  NEUTROABS 2.8  --   HGB 13.0 13.4  HCT 38.4* 41.0  MCV 90.8 90.7  PLT 152 169   Cardiac Enzymes:  Recent Labs  01/20/16 0309 01/20/16 0647 01/20/16 1236  TROPONINI 0.07* 0.07* 0.07*    Imaging: Dg Chest 2 View  01/19/2016  CLINICAL DATA:  Left-sided chest pain for 2 days EXAM: CHEST  2 VIEW COMPARISON:  05/02/2015 FINDINGS: Cardiac shadow is stable.  Postoperative changes are again seen. Emphysematous changes are seen similar to that noted on prior CT examination. Nodular changes seen on the prior exam are not well appreciated on this study. No focal confluent infiltrate is seen. No bony abnormality is noted. IMPRESSION: Chronic emphysematous changes with scarring. No acute abnormality is seen. Electronically Signed   By: Alcide Clever M.D.   On: 01/19/2016 18:45   Ct Angio Chest Pe W/cm &/or Wo Cm  01/19/2016  CLINICAL DATA:  Left-sided chest pain  IMPRESSION: No evidence of pulmonary emboli. Stable small pulmonary nodules consistent with benign etiology. Significant emphysematous changes. Some thickening is noted along 1 of the bulla in the right upper lobe likely related to progressive scarring.   Cardiac Studies:  ECG: SR PVC rate 94 no acute changes    Telemetry:  NSR no arrhythmia  Echo: pending   Medications:   . amitriptyline  150 mg Oral QHS  . [START ON 01/23/2016] aspirin  81 mg Oral Pre-Cath  . aspirin EC  81 mg Oral Daily  . atorvastatin  80 mg Oral q1800  . folic acid  1 mg Oral Daily  . furosemide  20 mg  Oral QPM  . furosemide  40 mg Oral Daily  . iron polysaccharides  150 mg Oral Q breakfast  . loteprednol  1 drop Both Eyes q morning - 10a  . metoprolol tartrate  25 mg Oral BID  . mometasone-formoterol  2 puff Inhalation BID  . nitroGLYCERIN  1 inch Topical Q6H  . pantoprazole  40 mg Oral BID  . predniSONE  5 mg Oral Q breakfast  . [START ON 01/23/2016] sodium chloride flush  3 mL Intravenous Q12H  . sodium chloride flush  3 mL Intravenous Q12H  . tiotropium  18 mcg Inhalation Daily  . vitamin B-12  500 mcg Oral Daily  . vitamin C  500 mg Oral Daily     . [START ON 01/23/2016] sodium chloride     Followed by  . [START ON 01/23/2016] sodium chloride    . heparin 1,500 Units/hr (01/21/16 0953)    Assessment/Plan:   1. Chest Pain: Patient has known CAD with CABG 10 years ago, he states he has been chest pain-free  for last 10 years and he has been active walking about half a mile 3 times a week in the gym and completely asymptomatic. In the last couple months he has been dealing with chronic bronchitis was unable to exercise as before but didn't experience any chest pain. This chest pain is new brought up by deep inspiration and ongoing with no relief with nitroglycerin. His troponins in only minimally elevated and has very flat trend.  I would favor diagnosis of mild acute myopericarditis and since he is just waiting here until Tuesday anyway I will try a trial off anti-inflammatory medicine with colchicine 0.6 by mouth twice a day and ibuprofen 400 mg by mouth twice a day. I will discontinue heparin and nitroglycerin. He is already on Protonix as he says that he has bad gastric reflux from NSAIDs. His echocardiogram is pending, the last one from 2009 showed normal LVEF and grade 1 diastolic dysfunction. If no improvement of chest pain with anti-inflammatory drugs and might still consider cardiac cath and she is taking as previously planned. He has frequent PVCs on his EKG and Holter including bigeminy, will continue current dose of metoprolol.  2. RA:  Has had methotrexate shot this week right hand contracture  3. COPD:  No active wheezing CXR NAD on low dose prednisone for RA  4. Chronic diastolic CHF - well controlled on current doses of diuretics.  5. Hypertension - well controlled.  Tobias Alexander 01/21/2016, 11:38 AM

## 2016-01-21 NOTE — Progress Notes (Signed)
  Echocardiogram 2D Echocardiogram has been performed.  Joseph [name] 01/21/2016, 11:37 AM

## 2016-01-21 NOTE — Progress Notes (Signed)
ANTICOAGULATION CONSULT NOTE - Follow Up Consult  Pharmacy Consult for heparin Indication: chest pain/ACS  No Known Allergies  Patient Measurements: Height: 6' (182.9 cm) Weight: 216 lb 3.2 oz (98.068 kg) IBW/kg (Calculated) : 77.6 Heparin Dosing Weight:  Vital Signs: Temp: 97.7 F (36.5 C) (05/28 0500) Temp Source: Oral (05/28 0500) BP: 100/74 mmHg (05/28 0500) Pulse Rate: 94 (05/28 0500)  Labs:  Recent Labs  01/18/16 0906  01/19/16 1836 01/20/16 0309 01/20/16 0647 01/20/16 1236 01/20/16 1636 01/21/16 0330  HGB 14.1  --  14.3 13.0  --   --   --  13.4  HCT 41.2  --  42.0 38.4*  --   --   --  41.0  PLT 170.0  --  178 152  --   --   --  169  LABPROT  --   --   --  16.2*  --   --   --   --   INR  --   --   --  1.28  --   --   --   --   HEPARINUNFRC  --   --   --   --  0.28*  --  0.50 0.60  CREATININE 0.64  --  0.65 0.70  --   --   --   --   TROPONINI  --   < > 0.06* 0.07* 0.07* 0.07*  --   --   < > = values in this interval not displayed.  Estimated Creatinine Clearance: 114.7 mL/min (by C-G formula based on Cr of 0.7).  Assessment:  65yo male with history of CAD presents with CP. Pharmacy is consulted to dose heparin for ACS/chest pain. AM HL remains therapeutic. CBC stable.  Goal of Therapy:  Heparin level 0.3-0.7 units/ml Monitor platelets by anticoagulation protocol: Yes   Plan:  Continue heparin gtt at 1500 units/hr Daily HL, CBC Monitor for S&S of bleed  Sandi Carne, PharmD Pharmacy Resident Pager: [phone] 01/21/2016,8:32 AM

## 2016-01-22 DIAGNOSIS — J438 Other emphysema: Secondary | ICD-10-CM | POA: Diagnosis not present

## 2016-01-22 DIAGNOSIS — I209 Angina pectoris, unspecified: Secondary | ICD-10-CM | POA: Diagnosis not present

## 2016-01-22 LAB — CBC
HCT: 40.5 % (ref 39.0–52.0)
Hemoglobin: 13.3 g/dL (ref 13.0–17.0)
MCH: 29.9 pg (ref 26.0–34.0)
MCHC: 32.8 g/dL (ref 30.0–36.0)
MCV: 91 fL (ref 78.0–100.0)
PLATELETS: 169 10*3/uL (ref 150–400)
RBC: 4.45 MIL/uL (ref 4.22–5.81)
RDW: 14.4 % (ref 11.5–15.5)
WBC: 4.8 10*3/uL (ref 4.0–10.5)

## 2016-01-22 LAB — HEPARIN LEVEL (UNFRACTIONATED)

## 2016-01-22 NOTE — Progress Notes (Signed)
Patient ID: Joseph [name], male   DOB: 09/11/1950, 64 y.o.   MRN: [id number]    Subjective   Chest pain improved Only a little now at end of expiratin   Objective:  Filed Vitals:   01/21/16 1655 01/21/16 2042 01/21/16 2046 01/22/16 0500  BP: 111/73  120/73 110/73  Pulse:  80 84 80  Temp:   97.5 F (36.4 C) 97.8 F (36.6 C)  TempSrc:   Oral Oral  Resp:  16 17   Height:      Weight:    98.113 kg (216 lb 4.8 oz)  SpO2:  96% 97% 100%    Intake/Output from previous day:  Intake/Output Summary (Last 24 hours) at 01/22/16 0826 Last data filed at 01/22/16 0400  Gross per 24 hour  Intake    483 ml  Output    225 ml  Net    258 ml    Physical Exam: Affect appropriate Chronically ill white male  HEENT: normal Neck supple with no adenopathy JVP normal no bruits no thyromegaly Lungs clear with no wheezing and good diaphragmatic motion Heart:  S1/S2 no murmur, no rub, gallop or click Previous sternotomy PMI normal Abdomen: benighn, BS positve, no tenderness, no AAA no bruit.  No HSM or HJR Distal pulses intact with no bruits S/P left radial harvest No edema Neuro non-focal Skin warm and dry Right hand contracture from RA.    Lab Results: Basic Metabolic Panel:  Recent Labs  01/19/16 1836 01/20/16 0309  NA 139 137  K 3.6 3.4*  CL 105 105  CO2 24 24  GLUCOSE 134* 164*  BUN 12 9  CREATININE 0.65 0.70  CALCIUM 9.4 8.9  MG  --  1.9   Liver Function Tests: No results for input(s): AST, ALT, ALKPHOS, BILITOT, PROT, ALBUMIN in the last 72 hours. CBC:  Recent Labs  01/20/16 0309 01/21/16 0330 01/22/16 0301  WBC 5.6 5.5 4.8  NEUTROABS 2.8  --   --   HGB 13.0 13.4 13.3  HCT 38.4* 41.0 40.5  MCV 90.8 90.7 91.0  PLT 152 169 169   Cardiac Enzymes:  Recent Labs  01/20/16 0309 01/20/16 0647 01/20/16 1236  TROPONINI 0.07* 0.07* 0.07*    Imaging: No results found.  Cardiac Studies:  ECG: SR PVC rate 94 no acute changes    Telemetry:  NSR no  arrhythmia  Echo: pending   Medications:   . amitriptyline  150 mg Oral QHS  . [START ON 01/23/2016] aspirin  81 mg Oral Pre-Cath  . aspirin EC  81 mg Oral Daily  . atorvastatin  80 mg Oral q1800  . colchicine  0.6 mg Oral BID  . folic acid  1 mg Oral Daily  . furosemide  20 mg Oral QPM  . furosemide  40 mg Oral Daily  . ibuprofen  400 mg Oral BID  . iron polysaccharides  150 mg Oral Q breakfast  . loteprednol  1 drop Both Eyes q morning - 10a  . metoprolol tartrate  25 mg Oral BID  . mometasone-formoterol  2 puff Inhalation BID  . nitroGLYCERIN  1 inch Topical Q6H  . pantoprazole  40 mg Oral BID  . predniSONE  5 mg Oral Q breakfast  . [START ON 01/23/2016] sodium chloride flush  3 mL Intravenous Q12H  . sodium chloride flush  3 mL Intravenous Q12H  . tiotropium  18 mcg Inhalation Daily  . vitamin B-12  500 mcg Oral Daily  . vitamin C    500 mg Oral Daily     . [START ON 01/23/2016] sodium chloride     Followed by  . [START ON 01/23/2016] sodium chloride      Assessment/Plan:  Chest Pain: 10 years post CABG no acute ECG changes but mild bump in troponin that has not evolved .  Pain responsive to nitro Discussed with patient and wife.  Reviewed Note by Dr Nelson and agree that some pleuritic type component to pain now on colchicine and ibuprofen.  On low dose steroids and methotrexate for RA which would make Pleuropericarditis less likely Would contiue to plan on cath tomorrow Orders written and placed on board.  Patient and wife in agreement given fact that grafts are 64 years old And he had until this time been asymptomatic  RA:  Has had methotrexate shot this week right hand contracture  COPD:  No active wheezing CXR NAD on low dose prednisone for RA  Peter Nishan 01/22/2016, 8:26 AM     

## 2016-01-22 NOTE — Clinical Documentation Improvement (Signed)
Cardiology  Patient with history of COPD and is Oxygen Dependent. Has been on 3L FiO2 while in-house. Please provide a diagnosis associated with Oxygen dependence and document findings in next progress note. Thank you!   Respiratory Failure - acute, chronic, acute on chronic  Respiratory Failure - Hypoxic, Hypercarbic, Combination of both  Respiratory Distress  Other  Clinically Undetermined  Document any associated diagnoses/conditions.  Supporting Information:  3L FiO2 being administered continuously  Please exercise your independent, professional judgment when responding. A specific answer is not anticipated or expected.  Thank You,  Shellee Milo RN, BSN, CCDS Health Information Management [place] [phone]; Cell: [phone]

## 2016-01-23 ENCOUNTER — Encounter (HOSPITAL_COMMUNITY)
Admission: EM | Disposition: A | Discharge status: Home / Self Care | Payer: Self-pay | Source: Home / Self Care | Attending: Emergency Medicine

## 2016-01-23 ENCOUNTER — Encounter (HOSPITAL_COMMUNITY): Payer: Self-pay | Admitting: Interventional Cardiology

## 2016-01-23 DIAGNOSIS — I25810 Atherosclerosis of coronary artery bypass graft(s) without angina pectoris: Secondary | ICD-10-CM | POA: Diagnosis not present

## 2016-01-23 DIAGNOSIS — I25118 Atherosclerotic heart disease of native coronary artery with other forms of angina pectoris: Secondary | ICD-10-CM

## 2016-01-23 DIAGNOSIS — I5032 Chronic diastolic (congestive) heart failure: Secondary | ICD-10-CM | POA: Insufficient documentation

## 2016-01-23 DIAGNOSIS — I25708 Atherosclerosis of coronary artery bypass graft(s), unspecified, with other forms of angina pectoris: Secondary | ICD-10-CM

## 2016-01-23 HISTORY — PX: CARDIAC CATHETERIZATION: SHX172

## 2016-01-23 LAB — BASIC METABOLIC PANEL
ANION GAP: 7 (ref 5–15)
BUN: 15 mg/dL (ref 6–20)
CALCIUM: 8.9 mg/dL (ref 8.9–10.3)
CO2: 25 mmol/L (ref 22–32)
Chloride: 106 mmol/L (ref 101–111)
Creatinine, Ser: 0.66 mg/dL (ref 0.61–1.24)
GFR calc Af Amer: >60 mL/min (ref >60)
GFR calc non Af Amer: >60 mL/min (ref >60)
GLUCOSE: 156 mg/dL — AB (ref 65–99)
POTASSIUM: 3.5 mmol/L (ref 3.5–5.1)
Sodium: 138 mmol/L (ref 135–145)

## 2016-01-23 SURGERY — LEFT HEART CATH AND CORS/GRAFTS ANGIOGRAPHY
Anesthesia: LOCAL

## 2016-01-23 MED ORDER — IOPAMIDOL (ISOVUE-370) INJECTION 76%
INTRAVENOUS | Status: DC | PRN
  Administered 2016-01-23: 115 mL via INTRAVENOUS

## 2016-01-23 MED ORDER — IBUPROFEN 400 MG PO TABS: 400.0000 mg | ORAL_TABLET | Freq: Two times a day (BID) | ORAL | Status: AC

## 2016-01-23 MED ORDER — SODIUM CHLORIDE 0.9% FLUSH: 3.0000 mL | Freq: Two times a day (BID) | INTRAVENOUS | Status: DC

## 2016-01-23 MED ORDER — LIDOCAINE HCL (PF) 1 % IJ SOLN
INTRAMUSCULAR | Status: AC
  Filled 2016-01-23: qty 30

## 2016-01-23 MED ORDER — HEPARIN (PORCINE) IN NACL 2-0.9 UNIT/ML-% IJ SOLN
INTRAMUSCULAR | Status: DC | PRN
  Administered 2016-01-23: 1000 mL

## 2016-01-23 MED ORDER — FENTANYL CITRATE (PF) 100 MCG/2ML IJ SOLN
INTRAMUSCULAR | Status: AC
  Filled 2016-01-23: qty 2

## 2016-01-23 MED ORDER — COLCHICINE 0.6 MG PO TABS: 0.6000 mg | ORAL_TABLET | Freq: Two times a day (BID) | ORAL | Status: DC

## 2016-01-23 MED ORDER — MIDAZOLAM HCL 2 MG/2ML IJ SOLN
INTRAMUSCULAR | Status: DC | PRN
  Administered 2016-01-23: 1 mg via INTRAVENOUS

## 2016-01-23 MED ORDER — MIDAZOLAM HCL 2 MG/2ML IJ SOLN
INTRAMUSCULAR | Status: AC
  Filled 2016-01-23: qty 2

## 2016-01-23 MED ORDER — IOPAMIDOL (ISOVUE-370) INJECTION 76%
INTRAVENOUS | Status: AC
  Filled 2016-01-23: qty 50

## 2016-01-23 MED ORDER — ACETAMINOPHEN 325 MG PO TABS: 650.0000 mg | ORAL_TABLET | ORAL | Status: DC | PRN

## 2016-01-23 MED ORDER — SODIUM CHLORIDE 0.9 % IV SOLN: 250.0000 mL | INTRAVENOUS | Status: DC | PRN

## 2016-01-23 MED ORDER — IOPAMIDOL (ISOVUE-370) INJECTION 76%
INTRAVENOUS | Status: AC
  Filled 2016-01-23: qty 100

## 2016-01-23 MED ORDER — HEPARIN (PORCINE) IN NACL 2-0.9 UNIT/ML-% IJ SOLN
INTRAMUSCULAR | Status: AC
  Filled 2016-01-23: qty 1000

## 2016-01-23 MED ORDER — FENTANYL CITRATE (PF) 100 MCG/2ML IJ SOLN
INTRAMUSCULAR | Status: DC | PRN
  Administered 2016-01-23: 50 ug via INTRAVENOUS

## 2016-01-23 MED ORDER — ONDANSETRON HCL 4 MG/2ML IJ SOLN: 4.0000 mg | Freq: Four times a day (QID) | INTRAMUSCULAR | Status: DC | PRN

## 2016-01-23 MED ORDER — SODIUM CHLORIDE 0.9% FLUSH: 3.0000 mL | INTRAVENOUS | Status: DC | PRN

## 2016-01-23 MED ORDER — LIDOCAINE HCL (PF) 1 % IJ SOLN
INTRAMUSCULAR | Status: DC | PRN
  Administered 2016-01-23: 20 mL

## 2016-01-23 MED ORDER — SODIUM CHLORIDE 0.9 % WEIGHT BASED INFUSION: 1.0000 mL/kg/h | INTRAVENOUS | Status: AC

## 2016-01-23 SURGICAL SUPPLY — 10 items
CATH INFINITI 5 FR IM (CATHETERS) ×2 IMPLANT
CATH SITESEER 5F MULTI A 2 (CATHETERS) ×2 IMPLANT
KIT HEART LEFT (KITS) ×2 IMPLANT
NEEDLE SMART REG 18GX2-3/4 (NEEDLE) ×2 IMPLANT
PACK CARDIAC CATHETERIZATION (CUSTOM PROCEDURE TRAY) ×2 IMPLANT
SHEATH PINNACLE 5F 10CM (SHEATH) ×2 IMPLANT
TRANSDUCER W/STOPCOCK (MISCELLANEOUS) ×2 IMPLANT
TUBING ART PRESS 72  MALE/FEM (TUBING) ×1
TUBING ART PRESS 72 MALE/FEM (TUBING) ×1 IMPLANT
WIRE EMERALD 3MM-J .035X150CM (WIRE) ×2 IMPLANT

## 2016-01-23 NOTE — Progress Notes (Signed)
INTERVAL HISTORY: Joseph [name] is a 65 yo M w a h/o CAD s/p CABG (2007, LIMA-LAD, left radial-OM, SVG-D1, SVG-PDA), DM with neuropathy, HTN, COPD on 3L [place], recurrent pneumonia secondary to chronic aspiration/reflux, RA, & Psoriasis who presented to [facility] ED on 5/26 with complaint of 24 hours of left-sided pleuritic chest pain and found to have borderline troponin elevation with nonspecific T wave changes on EKG. Patient was started on heparin for NSTEMI. Given pleurisy, flat trend of mildly elevated troponins diagnosis of myopericarditis was favored. Heparin and nitro was discontinued and he was started on colchicine and ibuprofen. However, given that patient was already on low dose steroids for RA, pericarditis is less likely. Patient continued to have pain and went for cardiac cath this morning which revealed occluded saphenous vein graft to diagonal, patent free left radial artery to the obtuse marginal, patent SVG to distal RCA of left Cx, widely patent LIMA to distal LAD, severe native vessel disease with total occlusion of several large diagonals. High-grade obstruction in nondominant RCA and high grade obstruction of proximal and mid LAD with total occlusion distally. Recommendations were for continued medical therapy.   SUBJECTIVE:  Patient was seen and examine post cardiac cath. He states his pleuritic left sided chest pain began improving yesterday and was very mild at a 1-2/10. This morning he is chest pain free. Pain was mildly improved with nitroglycerin and greatly improved with NSAIDs. He denies improvement with sitting forward. He denies a recent viral illness. He denies any shortness of breath, nausea or lightheadedness.  OBJECTIVE:   Vitals:   Filed Vitals:   01/23/16 0945 01/23/16 1000 01/23/16 1030 01/23/16 1100  BP: 103/49 119/76 117/71 116/73  Pulse: 77 79 60 67  Temp:      TempSrc:      Resp:      Height:      Weight:      SpO2: 97% 97% 95% 97%   I&O's:     Intake/Output Summary (Last 24 hours) at 01/23/16 1159 Last data filed at 01/23/16 0454  Gross per 24 hour  Intake 682.71 ml  Output   1400 ml  Net -717.29 ml   TELEMETRY: Reviewed telemetry pt in sinus rhythm with PVCs.  PHYSICAL EXAM General: Vital signs reviewed.  Patient is well-developed and well-nourished, in no acute distress and cooperative with exam.  Neck: Supple, trachea midline, no JVD or carotid bruit present.  Cardiovascular: RRR, S1 normal, S2 normal, no murmurs, gallops, or rubs. Right groin incision site well bandaged without hematoma.  Pulmonary/Chest: Clear to auscultation bilaterally, no wheezes, rales, or rhonchi. Abdominal: Soft, non-tender, non-distended, BS + Musculoskeletal: Bilateral deformities of MCP and PCP joints with Ulnar Deviation.  Extremities: No lower extremity edema bilaterally Skin: Warm, dry and intact.   LABS: Basic Metabolic Panel:  Recent Labs  58/09/98 0508  NA 138  K 3.5  CL 106  CO2 25  GLUCOSE 156*  BUN 15  CREATININE 0.66  CALCIUM 8.9   CBC:  Recent Labs  01/21/16 0330 01/22/16 0301  WBC 5.5 4.8  HGB 13.4 13.3  HCT 41.0 40.5  MCV 90.7 91.0  PLT 169 169   Cardiac Enzymes:  Recent Labs  01/20/16 1236  TROPONINI 0.07*   Coag Panel:   Lab Results  Component Value Date   INR 1.28 01/20/2016   INR 1.00 10/11/2009   INR 1.2 03/29/2008    RADIOLOGY: Dg Chest 2 View  01/19/2016  CLINICAL DATA:  Left-sided chest  pain for 2 days EXAM: CHEST  2 VIEW COMPARISON:  05/02/2015 FINDINGS: Cardiac shadow is stable. Postoperative changes are again seen. Emphysematous changes are seen similar to that noted on prior CT examination. Nodular changes seen on the prior exam are not well appreciated on this study. No focal confluent infiltrate is seen. No bony abnormality is noted. IMPRESSION: Chronic emphysematous changes with scarring. No acute abnormality is seen. Electronically Signed   By: Alcide Clever M.D.   On: 01/19/2016  18:45   Ct Angio Chest Pe W/cm &/or Wo Cm  01/19/2016  CLINICAL DATA:  Left-sided chest pain EXAM: CT ANGIOGRAPHY CHEST WITH CONTRAST TECHNIQUE: Multidetector CT imaging of the chest was performed using the standard protocol during bolus administration of intravenous contrast. Multiplanar CT image reconstructions and MIPs were obtained to evaluate the vascular anatomy. CONTRAST:  100 mL Isovue 370. COMPARISON:  05/02/2015 FINDINGS: Severe emphysematous changes are identified and stable from the prior exam. These are concentrated predominately in the upper lobes bilaterally. Along the minor fissure there is some thickening associated with some of the emphysematous bulla this likely represents some progressive scarring. No focal infiltrate is seen. Previously seen small nodules are again identified stable in appearance from the prior exam. Thoracic inlet is within normal limits. The thoracic aorta and its branches demonstrates some calcific changes. Changes of prior coronary bypass grafting are noted. No aneurysmal dilatation or dissection. The pulmonary artery demonstrates a normal branching pattern. No filling defects to suggest pulmonary emboli are identified. No hilar or mediastinal adenopathy are noted. The visualized upper abdomen is within normal limits. The osseous structures show degenerative change of the thoracic spine. Review of the MIP images confirms the above findings. IMPRESSION: No evidence of pulmonary emboli. Stable small pulmonary nodules consistent with benign etiology. Significant emphysematous changes. Some thickening is noted along 1 of the bulla in the right upper lobe likely related to progressive scarring. Electronically Signed   By: Alcide Clever M.D.   On: 01/19/2016 20:15    ASSESSMENT/PLAN:   Joseph [name] is a 65 yo M w a h/o CAD s/p CABG (2007, LIMA-LAD, left radial-OM, SVG-D1, SVG-PDA), DM with neuropathy, HTN, COPD on 3L [place], RA, & Psoriasis who presented to [facility] ED on 5/26  with complaint of 24 hours of left-sided pleuritic chest pain and found to have borderline troponin elevation with nonspecific T wave changes on EKG. Patient was started on heparin for NSTEMI.   Possible Pericarditis: Patient presented with 24 hours of atypical, pleuritic chest pain with mild, flat troponin trend (0.06>0.07>0.07>0.07) with non-specific T waves changes on EKG.Given pleurisy and flat trend of mildly elevated troponins, the diagnosis of myopericarditis was favored 2 days prior. There were no classic ST elevations or PR depressions seen. Heparin and nitro was discontinued and he was started on colchicine and ibuprofen with improvement and resolution of chest pain. However, given that patient was already on low dose steroids for RA, it would be less plausible for patient to develop pericarditis, but possible.  -Continue prednisone 5 mg daily for RA -Continue colchicine 0.6 mg BID and ibuprofen 400 mg BID -Consider d/c home today  CAD s/p CABG in 2007: Patient has a history of CABG 10 years ago, and has done functionally well since then. Cardiac cath this morning showed occluded saphenous vein graft to diagonal, patent free left radial artery to the obtuse marginal, patent SVG to distal RCA of left Cx, widely patent LIMA to distal LAD, severe native vessel disease with total  occlusion of several large diagonals. High-grade obstruction in nondominant RCA and high grade obstruction of proximal and mid LAD with total occlusion distally. Recommendations were for continued medical therapy.  -Continue ASA 81 mg daily -Continue atorvastatin 80 mg daily -Continue metoprolol 25 mg BID -Nitro prn -Consider Ranexa or Imdur for ongoing chest pain in the future  Chronic HFpEF: Echo on 01/21/16 showed EF of 50-55% and grade 1 diastolic dysfunction with normal filling pressures, mild RV systolic dysfunction. Patient is on Lasix at home.  -Lasix 40 mg QAM and 20 mg QPM  RA and Psoriasis: Patient is on  methotrexate and prednisone 5 mg daily at home.  -Continue prednisone 5 mg daily -Holding methotrexate (due Thursday)  GERD: Patient is on protonix 40 mg BID at home.  -Continue protonix 40 mg BID  COPD on 3L [place]: Patient is on albuterol prn, advair BIDand spiriva. -Continue current medications  DVT/PE ppx: Resume heparin SQ TID when able post cath CODE: FULL FEN: HH  Karlene Lineman, DO PGY-2 Internal Medicine Resident Pager # [phone] 01/23/2016 11:59 AM  I have seen and examined the patient along with Alexa Burns, DO.  I have reviewed the chart, notes and new data.  I agree with her note.  Key new complaints: no chest pain since he started NSAIDs Key examination changes: no complications at right femoral access site Key new findings / data: cath images reviewed and discussed with the patient. Has a small area of anterior wall motion abnormality corresponding to occluded secondary branches (ramus, D1, SVG-D1, D2), but widely patent bypasses in all other territories and preserved overall LVEF  PLAN: Treat for acute pericarditis with NSAIDs x 2 weeks and colchicine x 3 months. Reevaluate in the office in 2.5-3 weeks. Discussed symptoms and signs of enlarging pericardial effusion/tamponade and when he should call us/return to hospital. Forward information to his rheumatologist at [facility], Dr. Edwina Barth. Continue secondary prevention therapy for CAD and treatment for HFPEF.  Thurmon Fair, MD, [facility] [phone] 01/23/2016, 12:41 PM

## 2016-01-23 NOTE — Plan of Care (Signed)
Problem: Phase III Progression Outcomes Goal: No anginal pain Outcome: Completed/Met Date Met:  01/23/16 Pt remains free from chest pain  Goal: Cath/PCI Path as indicated Outcome: Completed/Met Date Met:  01/23/16 Pt went for cardiac cath Goal: Vascular site scale level 0 - I Vascular Site Scale Level 0: No bruising/bleeding/hematoma Level I (Mild): Bruising/Ecchymosis, minimal bleeding/ooozing, palpable hematoma < 3 cm Level II (Moderate): Bleeding not affecting hemodynamic parameters, pseudoaneurysm, palpable hematoma > 3 cm Level III (Severe) Bleeding which affects hemodynamic parameters or retroperitoneal hemorrhage  Outcome: Completed/Met Date Met:  01/23/16 Right groin level 0 Goal: Discharge plan remains appropriate-arrangements made Outcome: Completed/Met Date Met:  01/23/16 Pt being discharged home today  Goal: Tolerating diet Outcome: Completed/Met Date Met:  01/23/16 Pt able to tolerate diet with no difficulties   Problem: Discharge Progression Outcomes Goal: No anginal pain Outcome: Completed/Met Date Met:  01/23/16 Pt remains free from chest pain  Goal: Discharge plan in place and appropriate Outcome: Completed/Met Date Met:  01/23/16 Pt being discharged home today Goal: Vascular site scale level 0 - I Vascular Site Scale Level 0: No bruising/bleeding/hematoma Level I (Mild): Bruising/Ecchymosis, minimal bleeding/ooozing, palpable hematoma < 3 cm Level II (Moderate): Bleeding not affecting hemodynamic parameters, pseudoaneurysm, palpable hematoma > 3 cm Level III (Severe) Bleeding which affects hemodynamic parameters or retroperitoneal hemorrhage  Outcome: Completed/Met Date Met:  01/23/16 Right groin level 0 Goal: Tolerates diet Outcome: Completed/Met Date Met:  01/23/16 Pt able to tolerate diet with no difficulties  Goal: Activity appropriate for discharge plan Outcome: Completed/Met Date Met:  01/23/16 Pt being discharged home today

## 2016-01-23 NOTE — H&P (View-Only) (Signed)
Patient ID: Joseph [name], male   DOB: [birth year], 65 y.o.   MRN: [id number]    Subjective   Chest pain improved Only a little now at end of expiratin   Objective:  Filed Vitals:   01/21/16 1655 01/21/16 2042 01/21/16 2046 01/22/16 0500  BP: 111/73  120/73 110/73  Pulse:  80 84 80  Temp:   97.5 F (36.4 C) 97.8 F (36.6 C)  TempSrc:   Oral Oral  Resp:  16 17   Height:      Weight:    98.113 kg (216 lb 4.8 oz)  SpO2:  96% 97% 100%    Intake/Output from previous day:  Intake/Output Summary (Last 24 hours) at 01/22/16 2542 Last data filed at 01/22/16 0400  Gross per 24 hour  Intake    483 ml  Output    225 ml  Net    258 ml    Physical Exam: Affect appropriate Chronically ill white male  HEENT: normal Neck supple with no adenopathy JVP normal no bruits no thyromegaly Lungs clear with no wheezing and good diaphragmatic motion Heart:  S1/S2 no murmur, no rub, gallop or click Previous sternotomy PMI normal Abdomen: benighn, BS positve, no tenderness, no AAA no bruit.  No HSM or HJR Distal pulses intact with no bruits S/P left radial harvest No edema Neuro non-focal Skin warm and dry Right hand contracture from RA.    Lab Results: Basic Metabolic Panel:  Recent Labs  70/62/37 1836 01/20/16 0309  NA 139 137  K 3.6 3.4*  CL 105 105  CO2 24 24  GLUCOSE 134* 164*  BUN 12 9  CREATININE 0.65 0.70  CALCIUM 9.4 8.9  MG  --  1.9   Liver Function Tests: No results for input(s): AST, ALT, ALKPHOS, BILITOT, PROT, ALBUMIN in the last 72 hours. CBC:  Recent Labs  01/20/16 0309 01/21/16 0330 01/22/16 0301  WBC 5.6 5.5 4.8  NEUTROABS 2.8  --   --   HGB 13.0 13.4 13.3  HCT 38.4* 41.0 40.5  MCV 90.8 90.7 91.0  PLT 152 169 169   Cardiac Enzymes:  Recent Labs  01/20/16 0309 01/20/16 0647 01/20/16 1236  TROPONINI 0.07* 0.07* 0.07*    Imaging: No results found.  Cardiac Studies:  ECG: SR PVC rate 94 no acute changes    Telemetry:  NSR no  arrhythmia  Echo: pending   Medications:   . amitriptyline  150 mg Oral QHS  . [START ON 01/23/2016] aspirin  81 mg Oral Pre-Cath  . aspirin EC  81 mg Oral Daily  . atorvastatin  80 mg Oral q1800  . colchicine  0.6 mg Oral BID  . folic acid  1 mg Oral Daily  . furosemide  20 mg Oral QPM  . furosemide  40 mg Oral Daily  . ibuprofen  400 mg Oral BID  . iron polysaccharides  150 mg Oral Q breakfast  . loteprednol  1 drop Both Eyes q morning - 10a  . metoprolol tartrate  25 mg Oral BID  . mometasone-formoterol  2 puff Inhalation BID  . nitroGLYCERIN  1 inch Topical Q6H  . pantoprazole  40 mg Oral BID  . predniSONE  5 mg Oral Q breakfast  . [START ON 01/23/2016] sodium chloride flush  3 mL Intravenous Q12H  . sodium chloride flush  3 mL Intravenous Q12H  . tiotropium  18 mcg Inhalation Daily  . vitamin B-12  500 mcg Oral Daily  . vitamin C  500 mg Oral Daily     . [START ON 01/23/2016] sodium chloride     Followed by  . [START ON 01/23/2016] sodium chloride      Assessment/Plan:  Chest Pain: 10 years post CABG no acute ECG changes but mild bump in troponin that has not evolved .  Pain responsive to nitro Discussed with patient and wife.  Reviewed Note by Dr Delton See and agree that some pleuritic type component to pain now on colchicine and ibuprofen.  On low dose steroids and methotrexate for RA which would make Pleuropericarditis less likely Would contiue to plan on cath tomorrow Orders written and placed on board.  Patient and wife in agreement given fact that grafts are 64 years old And he had until this time been asymptomatic  RA:  Has had methotrexate shot this week right hand contracture  COPD:  No active wheezing CXR NAD on low dose prednisone for RA  Charlton Haws 01/22/2016, 8:26 AM

## 2016-01-23 NOTE — Interval H&P Note (Signed)
Cath Lab Visit (complete for each Cath Lab visit)  Clinical Evaluation Leading to the Procedure:   ACS: Yes.    Non-ACS:    Anginal Classification: CCS III  Anti-ischemic medical therapy: Maximal Therapy (2 or more classes of medications)  Non-Invasive Test Results: No non-invasive testing performed  Prior CABG: Previous CABG      History and Physical Interval Note:  01/23/2016 7:31 AM  Joseph [name]  has presented today for surgery, with the diagnosis of NSTEMI  The various methods of treatment have been discussed with the patient and family. After consideration of risks, benefits and other options for treatment, the patient has consented to  Procedure(s): Left Heart Cath and Cors/Grafts Angiography (N/A) as a surgical intervention .  The patient's history has been reviewed, patient examined, no change in status, stable for surgery.  I have reviewed the patient's chart and labs.  Questions were answered to the patient's satisfaction.     Lyn Records III

## 2016-01-23 NOTE — Plan of Care (Signed)
Problem: Phase I Progression Outcomes Goal: Pain controlled with appropriate interventions Outcome: Completed/Met Date Met:  01/23/16 Pt remains free from chest pain

## 2016-01-23 NOTE — Discharge Summary (Signed)
Discharge Summary    Patient ID: Joseph [name],  MRN: [id number], DOB/AGE: October 29, 1950 65 y.o.  Admit date: 01/19/2016 Discharge date: 01/23/2016  Primary Care Provider: Willow Ora Primary Cardiologist: Dr. Antoine Poche Rheumatologist: Dr. Maudie Flakes  Discharge Diagnoses    Active Problems:   DMII (diabetes mellitus, type 2) (HCC)   Essential hypertension   COPD with emphysema (HCC)   Rheumatoid arthritis (HCC)   Chest pain   Troponin level elevated   Left sided chest pain   Chronic diastolic heart failure (HCC)  Allergies No Known Allergies  Diagnostic Studies/Procedures    Cardiac Cath 01/23/16:  Mid RCA lesion, 95% stenosed.  Ost 1st Mrg lesion, 100% stenosed.  Mid Cx lesion, 99% stenosed.  Prox LAD lesion, 95% stenosed.  Ost Ramus to Ramus lesion, 100% stenosed.  Ost 1st Diag to 1st Diag lesion, 100% stenosed.  Mid LAD lesion, 99% stenosed.  Ost 2nd Diag lesion, 100% stenosed.  Radial artery .  SVG .  Prox Graft lesion, 100% stenosed.  Occluded saphenous vein graft to the diagonal. It is presumed that the saphenous vein graft was fashioned off the hood of the radial graft to the obtuse marginal.  Patent free left radial artery to the obtuse marginal, patent SVG to the distal RCA of the left circumflex, and widely patent LIMA to the distal LAD.  Severe native vessel disease with total occlusion of several large diagonals filling late by collaterals. High-grade obstruction in the nondominant right coronary. High-grade obstruction of the proximal and mid LAD with total occlusion distally.  Mid to distal anterior wall akinesis in the distribution of the occluded diagonals. LVEF 40%. Normal LVEDP.  Chest pain of pleuritic quality.  RECOMMENDATIONS:   Continued medical therapy. _____________  TTE 5/28: Study Conclusions  - Left ventricle: The cavity size was normal. There was mild  concentric hypertrophy. Systolic function was normal. The   estimated ejection fraction was in the range of 50% to 55%. Wall  motion was normal; there were no regional wall motion  abnormalities. Doppler parameters are consistent with abnormal  left ventricular relaxation (grade 1 diastolic dysfunction).  There was no evidence of elevated ventricular filling pressure by  Doppler parameters. - Aortic valve: Trileaflet; moderately thickened, moderately  calcified leaflets. There was very mild stenosis. - Left atrium: The atrium was at the upper limits of normal in  size. - Right ventricle: The cavity size was mildly dilated. Wall  thickness was normal. Systolic function was mildly reduced. - Tricuspid valve: There was mild regurgitation. - Pulmonary arteries: Systolic pressure was within the normal  range. - Inferior vena cava: The vessel was normal in size.  Impressions:  - Low normal LVEF, grade 1 diastolic dysfunction with normal  filling pressures.  Mildly dilated RVEF with mild RV systolic dysfunction.  Normal RVSP.   History of Present Illness     65 yo M w a h/o CAD s/p CABG (2007, LIMA-LAD, left radial-OM, SVG-D1, SVG-PDA), DM with neuropathy, HTN, COPD on 3L [place], recurrent pneumonia secondary to chronic aspiration/reflux, RA, & Psoriasis is currently being transferred from [facility] where he presented with over 24 hours of left-sided pleuritic chest pain that started while he was shopping yesterday afternoon, occurring every time he took a deep breath. He denied association with oral intake, position, or activity. He denied associated symptoms. 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 He denied recent travel, surgery, trauma to site,  or edema.   He last saw his primary cardiologist 07/17/15 when he was doing well. At that point, the plan had been for a follow-up stress test the following year, 10 years out from his CABG. Due to his RA & neuropathy, his physical  activity is somewhat limited. He chronically sleeps sitting up due to his tendency to aspirate. He denied lower extremity swelling or paroxysmal nocturnal dyspnea.   While he was in the OSH ED, he was treated with ASA 324. During the time of my evaluation SL NTG x 2 was given, which resulted in improvement in his pain from a 6/10 to a 3/10. This was the first intervention that caused improvement in his pain. A third dose was not given as his SBP dropped from the 140's to 100's. At that point, NTG paste was applied.   Relevant Data - EKG (01/19/16): Sinus rhythm with frequent PVC, nonspecific T wave abnormalities - Chest CTA (01/19/16): No evidence of PE   Hospital Course     Consultants: None (Cardiology-primary)  Joseph [name] is a 65 yo M w a h/o CAD s/p CABG (2007, LIMA-LAD, left radial-OM, SVG-D1, SVG-PDA), DM with neuropathy, HTN, COPD on 3L [place], RA, & Psoriasis who presented to [facility] ED on 5/26 with complaint of 24 hours of left-sided pleuritic chest pain and found to have borderline troponin elevation with nonspecific T wave changes on EKG. Patient was started on heparin for NSTEMI.   Possible Pericarditis: Likely viral versus autoimmune. Patient presented with 24 hours of atypical, pleuritic chest pain with mild, flat troponin trend (0.06>0.07>0.07>0.07) with non-specific T waves changes on EKG. Given pleurisy and flat trend of mildly elevated troponins, the diagnosis of myopericarditis was favored. CTA was negative for PE. However, there were no classic ST elevations or PR depressions seen on EKG. Patient was started on colchicine and ibuprofen with improvement followed by resolution of chest pain. Patient was discharged on colchicine 0.6 mg BID for 3 months (stop date 04/24/16) and ibuprofen 400 mg BID for 2 weeks (stop date 02/04/16). Patient was continued on prednisone 5 mg daily for RA.  CAD s/p CABG in 2007: Patient has a history of CABG 10 years ago, and has done functionally  well since then. Cardiac cath on 01/23/16 showed occluded saphenous vein graft to diagonal, patent free left radial artery to the obtuse marginal, patent SVG to distal RCA of left Cx, widely patent LIMA to distal LAD, severe native vessel disease with total occlusion of several large diagonals. High-grade obstruction in nondominant RCA and high grade obstruction of proximal and mid LAD with total occlusion distally. Recommendations were for continued medical therapy. Patient was discharged on ASA 81 mg daily, atorvastatin 80 mg daily, metoprolol 25 mg BID, and Nitro prn.  Chronic HFpEF: Echo on 01/21/16 showed EF of 50-55% and grade 1 diastolic dysfunction with normal filling pressures, mild RV systolic dysfunction. Patient was discharged on home regimen of Lasix 40 mg QAM and 20 mg QPM.  RA and Psoriasis: Patient is on methotrexate and prednisone 5 mg daily at home. These were continued during hospitalization and on discharge.  GERD: Patient is on protonix 40 mg BID at home which was continued during hospitalization and on discharge.  COPD on 3L [place]: Patient is on albuterol prn, advair BIDand spiriva at home. _____________  Discharge Vitals Blood pressure 110/78, pulse 70, temperature 98.3 F (36.8 C), temperature source Oral, resp. rate 15, height 6' (1.829 m), weight 218 lb 8 oz (99.111 kg), SpO2  97 %.  Filed Weights   01/21/16 0500 01/22/16 0500 01/23/16 0522  Weight: 216 lb 3.2 oz (98.068 kg) 216 lb 4.8 oz (98.113 kg) 218 lb 8 oz (99.111 kg)    Labs & Radiologic Studies     CBC  Recent Labs  01/21/16 0330 01/22/16 0301  WBC 5.5 4.8  HGB 13.4 13.3  HCT 41.0 40.5  MCV 90.7 91.0  PLT 169 169   Basic Metabolic Panel  Recent Labs  01/23/16 0508  NA 138  K 3.5  CL 106  CO2 25  GLUCOSE 156*  BUN 15  CREATININE 0.66  CALCIUM 8.9   Dg Chest 2 View  01/19/2016  CLINICAL DATA:  Left-sided chest pain for 2 days EXAM: CHEST  2 VIEW COMPARISON:  05/02/2015 FINDINGS: Cardiac  shadow is stable. Postoperative changes are again seen. Emphysematous changes are seen similar to that noted on prior CT examination. Nodular changes seen on the prior exam are not well appreciated on this study. No focal confluent infiltrate is seen. No bony abnormality is noted. IMPRESSION: Chronic emphysematous changes with scarring. No acute abnormality is seen. Electronically Signed   By: Alcide Clever M.D.   On: 01/19/2016 18:45   Ct Angio Chest Pe W/cm &/or Wo Cm  01/19/2016  CLINICAL DATA:  Left-sided chest pain EXAM: CT ANGIOGRAPHY CHEST WITH CONTRAST TECHNIQUE: Multidetector CT imaging of the chest was performed using the standard protocol during bolus administration of intravenous contrast. Multiplanar CT image reconstructions and MIPs were obtained to evaluate the vascular anatomy. CONTRAST:  100 mL Isovue 370. COMPARISON:  05/02/2015 FINDINGS: Severe emphysematous changes are identified and stable from the prior exam. These are concentrated predominately in the upper lobes bilaterally. Along the minor fissure there is some thickening associated with some of the emphysematous bulla this likely represents some progressive scarring. No focal infiltrate is seen. Previously seen small nodules are again identified stable in appearance from the prior exam. Thoracic inlet is within normal limits. The thoracic aorta and its branches demonstrates some calcific changes. Changes of prior coronary bypass grafting are noted. No aneurysmal dilatation or dissection. The pulmonary artery demonstrates a normal branching pattern. No filling defects to suggest pulmonary emboli are identified. No hilar or mediastinal adenopathy are noted. The visualized upper abdomen is within normal limits. The osseous structures show degenerative change of the thoracic spine. Review of the MIP images confirms the above findings. IMPRESSION: No evidence of pulmonary emboli. Stable small pulmonary nodules consistent with benign etiology.  Significant emphysematous changes. Some thickening is noted along 1 of the bulla in the right upper lobe likely related to progressive scarring. Electronically Signed   By: Alcide Clever M.D.   On: 01/19/2016 20:15    Disposition   Pt is being discharged home today in good condition.  Follow-up Plans & Appointments    Follow-up Information    Follow up with Rollene Rotunda, MD On 02/13/2016.   Specialty:  Cardiology   Why:  11:30 am   Contact information:   [address] [place] [address] [phone]        Discharge Medications   Current Discharge Medication List    START taking these medications   Details  colchicine 0.6 MG tablet Take 1 tablet (0.6 mg total) by mouth 2 (two) times daily. Qty: 180 tablet, Refills: 0    ibuprofen (ADVIL,MOTRIN) 400 MG tablet Take 1 tablet (400 mg total) by mouth 2 (two) times daily. Qty: 22 tablet, Refills: 0  CONTINUE these medications which have NOT CHANGED   Details  albuterol (PROVENTIL HFA;VENTOLIN HFA) 108 (90 BASE) MCG/ACT inhaler Inhale 2 puffs into the lungs every 4 (four) hours as needed for wheezing or shortness of breath. Qty: 1 Inhaler, Refills: 6    amitriptyline (ELAVIL) 75 MG tablet Take 2 tablets at bedtime    Ascorbic Acid (VITAMIN C) 500 MG tablet Take 500 mg by mouth daily.      aspirin 81 MG tablet Take 81 mg by mouth daily.    atorvastatin (LIPITOR) 80 MG tablet TAKE 1 TABLET(80 MG) BY MOUTH DAILY Qty: 90 tablet, Refills: 2    Carbonyl Iron (PERFECT IRON PO) Take 1 tablet by mouth every morning.     clobetasol cream (TEMOVATE) 0.05 % Apply 1 application topically 2 (two) times daily.    Fluticasone-Salmeterol (ADVAIR DISKUS) 100-50 MCG/DOSE AEPB Inhale 1 puff into the lungs 2 (two) times daily. Qty: 60 each, Refills: 5    folic acid (FOLVITE) 800 MCG tablet Take 800 mcg by mouth daily.    furosemide (LASIX) 20 MG tablet Take 2 tablets in the morning and 1 tablets at 4 pm Qty: 270  tablet, Refills: 3    Loteprednol Etabonate (LOTEMAX) 0.5 % GEL Place 1 drop into both eyes every morning. daily    Methotrexate Sodium, PF, 50 MG/2ML SOLN Inject 25 mg into the skin. Once a week. Patient not sure of dose. Usually take on Thursdays    metoprolol tartrate (LOPRESSOR) 25 MG tablet Take 1 tablet (25 mg total) by mouth 2 (two) times daily. Qty: 180 tablet, Refills: 3    nitroGLYCERIN (NITROSTAT) 0.4 MG SL tablet Place 1 tablet (0.4 mg total) under the tongue every 5 (five) minutes as needed for chest pain. Qty: 25 tablet, Refills: 1    oxyCODONE-acetaminophen (PERCOCET) 7.5-325 MG per tablet Take 1 tablet by mouth 3 (three) times daily. 3 times a day    pantoprazole (PROTONIX) 40 MG tablet Take 1 tablet (40 mg total) by mouth 2 (two) times daily. Qty: 180 tablet, Refills: 3    predniSONE (DELTASONE) 5 MG tablet Take 5 mg by mouth daily. Take as directed    tiotropium (SPIRIVA HANDIHALER) 18 MCG inhalation capsule INHALE 1 CAPSULE BY MOUTH IN HANDIHALER DAILY Qty: 30 capsule, Refills: 5    vitamin B-12 (CYANOCOBALAMIN) 250 MCG tablet Take 250 mcg by mouth daily.      B-D TB SYRINGE 1CC/27GX1/2" 27G X 1/2" 1 ML MISC Reported on 10/17/2015    glucose blood (ONE TOUCH ULTRA TEST) test strip Test blood sugar no more than twice daily. Qty: 100 each, Refills: 12    Misc. Devices (ACAPELLA) MISC Use as directed Qty: 1 each, Refills: 0    NON FORMULARY [place] PORTABLE PULSE DOSE OXYGEN oxygen 5L per nasal cannula 24 hours a day Dx: 496    potassium chloride (K-DUR,KLOR-CON) 10 MEQ tablet Take two (4) tablets (40 meq total) by mouth every morning and take one (2) tablet (20 meq total) by mouth every evening. Qty: 180 tablet, Refills: 11    Respiratory Therapy Supplies (FLUTTER) DEVI 1 Inhaler by Does not apply route 2 (two) times daily. Qty: 1 each, Refills: 0         Aspirin prescribed at discharge?  Yes High Intensity Statin Prescribed? (Lipitor 40-80mg  or Crestor  20-40mg ): Yes Beta Blocker Prescribed? Yes For EF 45% or less, Was ACEI/ARB Prescribed? No:  ADP Receptor Inhibitor Prescribed? (i.e. Plavix etc.-Includes Medically Managed Patients): No:  For  EF <40%, Aldosterone Inhibitor Prescribed? No:  Was EF assessed during THIS hospitalization? Yes Was Cardiac Rehab II ordered? (Included Medically managed Patients): No:    Outstanding Labs/Studies   None  Duration of Discharge Encounter   Greater than 30 minutes including physician time.  SignedKarlene Lineman, DO PGY-2 Internal Medicine Resident Pager # [phone] 01/23/2016 1:11 PM

## 2016-01-23 NOTE — Discharge Instructions (Signed)
PLEASE FOLLOW UP WITH Joseph [name]/DR. HOCHREIN ON 02/13/16 AT 11:30 AM AS LISTED IN THIS PACKET.   PLEASE TAKE ALL MEDICATIONS AS PRESCRIBED.   Thank you for allowing [place] to be involved in your healthcare while you were hospitalized at [facility].   Please note that there have been changes to your home medications.  --> PLEASE LOOK AT YOUR DISCHARGE MEDICATION LIST FOR DETAILS.  Please call your PCP if you have any questions or concerns, or any difficulty getting any of your medications.  Please return to the ER if you have worsening of your symptoms or new severe symptoms arise.

## 2016-01-23 NOTE — Progress Notes (Addendum)
Site area: right groin a 5 french arterial sheath was removed  Site Prior to Removal:  Level 0  Pressure Applied For 15 MINUTES    Minutes Beginning at 0840a  Manual:   Yes.    Patient Status During Pull:  stable  Post Pull Groin Site:  Level 0  Post Pull Instructions Given:  Yes.    Post Pull Pulses Present:  Yes.    Dressing Applied:  Yes.    Comments:  VS remain stable during sheath pull.

## 2016-01-24 ENCOUNTER — Telehealth: Payer: Self-pay | Admitting: Behavioral Health

## 2016-01-24 NOTE — Telephone Encounter (Signed)
Patient declines TCM/Hospital Follow-up at this time. He stated, "I'm feeling fine, just a little sore". Patient voiced that he will be seeing his cardiologist, Dr. Antoine Poche on 02/13/16 at 11:30 AM. Also, patient has an appointment for a 4 month follow-up with Dr. Drue Novel on 02/16/16 at 1:15 PM. Advised the patient that if he develops a worsening condition, call the office to schedule with PCP or seek the Emergency Department. He verbalized understanding and did not have any questions or concerns prior to the call ending.

## 2016-02-07 ENCOUNTER — Emergency Department (HOSPITAL_COMMUNITY): Payer: BC Managed Care – PPO

## 2016-02-07 ENCOUNTER — Encounter (HOSPITAL_COMMUNITY): Payer: Self-pay | Admitting: Family Medicine

## 2016-02-07 ENCOUNTER — Other Ambulatory Visit: Payer: Self-pay | Admitting: Physician Assistant

## 2016-02-07 ENCOUNTER — Emergency Department (HOSPITAL_COMMUNITY)
Admission: EM | Admit: 2016-02-07 | Discharge: 2016-02-07 | Disposition: A | Discharge status: Home / Self Care | Payer: BC Managed Care – PPO | Attending: Emergency Medicine | Admitting: Emergency Medicine

## 2016-02-07 ENCOUNTER — Telehealth: Payer: Self-pay | Admitting: Cardiology

## 2016-02-07 DIAGNOSIS — Z7982 Long term (current) use of aspirin: Secondary | ICD-10-CM | POA: Insufficient documentation

## 2016-02-07 DIAGNOSIS — Z951 Presence of aortocoronary bypass graft: Secondary | ICD-10-CM | POA: Diagnosis not present

## 2016-02-07 DIAGNOSIS — J449 Chronic obstructive pulmonary disease, unspecified: Secondary | ICD-10-CM | POA: Diagnosis not present

## 2016-02-07 DIAGNOSIS — R079 Chest pain, unspecified: Secondary | ICD-10-CM | POA: Diagnosis present

## 2016-02-07 DIAGNOSIS — I2510 Atherosclerotic heart disease of native coronary artery without angina pectoris: Secondary | ICD-10-CM | POA: Insufficient documentation

## 2016-02-07 DIAGNOSIS — I319 Disease of pericardium, unspecified: Secondary | ICD-10-CM

## 2016-02-07 DIAGNOSIS — I10 Essential (primary) hypertension: Secondary | ICD-10-CM | POA: Diagnosis not present

## 2016-02-07 DIAGNOSIS — E1140 Type 2 diabetes mellitus with diabetic neuropathy, unspecified: Secondary | ICD-10-CM | POA: Insufficient documentation

## 2016-02-07 DIAGNOSIS — Z87891 Personal history of nicotine dependence: Secondary | ICD-10-CM | POA: Insufficient documentation

## 2016-02-07 DIAGNOSIS — E1143 Type 2 diabetes mellitus with diabetic autonomic (poly)neuropathy: Secondary | ICD-10-CM | POA: Diagnosis not present

## 2016-02-07 LAB — BASIC METABOLIC PANEL
Anion gap: 10 (ref 5–15)
BUN: 11 mg/dL (ref 6–20)
CO2: 26 mmol/L (ref 22–32)
Calcium: 9.6 mg/dL (ref 8.9–10.3)
Chloride: 103 mmol/L (ref 101–111)
Creatinine, Ser: 0.69 mg/dL (ref 0.61–1.24)
GFR calc Af Amer: >60 mL/min (ref >60)
GFR calc non Af Amer: >60 mL/min (ref >60)
Glucose, Bld: 178 mg/dL — ABNORMAL HIGH (ref 65–99)
Potassium: 3.9 mmol/L (ref 3.5–5.1)
Sodium: 139 mmol/L (ref 135–145)

## 2016-02-07 LAB — CBC
HCT: 39.9 % (ref 39.0–52.0)
Hemoglobin: 13.2 g/dL (ref 13.0–17.0)
MCH: 30.1 pg (ref 26.0–34.0)
MCHC: 33.1 g/dL (ref 30.0–36.0)
MCV: 91.1 fL (ref 78.0–100.0)
Platelets: 204 10*3/uL (ref 150–400)
RBC: 4.38 MIL/uL (ref 4.22–5.81)
RDW: 14.6 % (ref 11.5–15.5)
WBC: 5.3 10*3/uL (ref 4.0–10.5)

## 2016-02-07 LAB — I-STAT TROPONIN, ED: Troponin i, poc: 0 ng/mL (ref 0.00–0.08)

## 2016-02-07 MED ORDER — IBUPROFEN 400 MG PO TABS: 400.0000 mg | ORAL_TABLET | Freq: Two times a day (BID) | ORAL | Status: DC

## 2016-02-07 MED ORDER — IBUPROFEN 200 MG PO TABS
600.0000 mg | ORAL_TABLET | Freq: Once | ORAL | Status: AC
  Administered 2016-02-07: 600 mg via ORAL
  Filled 2016-02-07: qty 1

## 2016-02-07 NOTE — H&P (Signed)
Patient ID: DASCHEL ROUGHTON MRN: [id number], DOB/AGE: [birth year]   Admit date: 02/07/2016  Requesting Physician: Primary Physician: Willow Ora, MD Primary Cardiologist: Dr. Antoine Poche Reason for admission: chest pain  Pt. Profile:  Joseph [name] is a 65 y.o. male with a history of CAD s/p CABG (2007, LIMA-LAD, left radial-OM, SVG-D1, SVG-PDA), DM with neuropathy, HTN, COPD on 3L [place], recurrent pneumonia secondary to chronic aspiration/reflux, RA, and psoriasis and recent admission for possible pericarditis who presents to [facility] today with pleuritic chest pain similar to recent admission.   He was recently admitted from 5/26-5/30/17 for pleuritic chest pain and mildly elevated troponin. Cardiac cath on 01/23/16 showed occluded saphenous vein graft to diagonal, patent free left radial artery to the obtuse marginal, patent SVG to distal RCA of left Cx, widely patent LIMA to distal LAD, severe native vessel disease with total occlusion of several large diagonals. High-grade obstruction in nondominant RCA and high grade obstruction of proximal and mid LAD with total occlusion distally. Recommendations were for continued medical therapy.   Given pleurisy and flat trend of mildly elevated troponins, the diagnosis of myopericarditis was favored. CTA was negative for PE. However, there were no classic ST elevations or PR depressions seen on EKG. Patient was started on colchicine and ibuprofen with improvement followed by resolution of chest pain. Patient was discharged on colchicine 0.6 mg BID for 3 months (stop date 04/24/16) and ibuprofen 400 mg BID for 2 weeks (stop date 02/03/16). Patient was continued on prednisone 5 mg daily for RA.  Patient called office today complaining of chest pain similar to recent admission for pericarditis and was told to go to the ER. He had no chest pain while on the ibuprofen and for several days after. Then last night he was watching TV when he had sudden onset of right sided  chest pain from the armpit to the bottom of the ribcage. It was similar to pain of most recent admission but on a different side and much more severe. It was worse with a deep breath in. He was up all night due to the pain. He sat in the kitchen and leaned forward which did not seem to help. It lasted all the way until this AM despite taking percocet. It was relieved to a 3/10 pain currently after 600mg  ibuprofen in the ED.     He has chronic dyspnea due to COPD on 3L of 02 at home. It is no worse. No LE edema, orthopnea or PND. No dizziness or syncope. No blood in his stool or urine.    Problem List  Past Medical History  Diagnosis Date  . Rheumatoid arthritis(714.0)   . Psoriasis   . Hypertension   . Gastroesophageal reflux disease   . COPD (chronic obstructive pulmonary disease) (HCC)   . CAD (coronary artery disease)     MI 2007, CABG  . Osteoporosis     per DEXA 05-2008  . Peripheral polyneuropathy (HCC)     Severe, causing problems with his feet, see surgeries  . Diabetes mellitus     Dx 07-2008 A1C 6.2  . Onychomycosis     toenails  . Hiatal hernia   . Esophageal dysmotility   . Gastroparesis   . Fatty liver 10/11/09  . Neuropathy (HCC)     (related to RA per neuro note 05-2011)  . Oxygen dependent     3l  . Complication of anesthesia     DIFFICULTY AFTER CABG HAD CONFUSION POST OP  .  Myocardial infarction (HCC) 2007  . Shortness of breath   . Cataracts, bilateral   . Rheumatoid arthritis [facility])     [facility] Rheumatology    Past Surgical History  Procedure Laterality Date  . Foot surgery      to tendon release and repair of osteomyelitis   . Cholecystectomy    . Coronary artery bypass graft  2007    (LIMA to the LAD, left radial to obtuse marginal, SVG to first diagonal, SVG to PDA. His ejection fraction to 50-55%  . Achiles tendon surgery  8/09  . Toe amputation  4-12/ 3 14    d/t a deformity, found to have osteomyelitis, complicated by post-op foot strtess FX  .  Esophagogastroduodenoscopy N/A 10/19/2012    Procedure: ESOPHAGOGASTRODUODENOSCOPY (EGD);  Surgeon: Hart Carwin, MD;  Location: [facility] ENDOSCOPY;  Service: Endoscopy;  Laterality: N/A;  the dilatation is possible.   . Laryngoscopy  01/21/2014     DR BATES   . Laryngoscopy N/A 01/21/2014    Procedure: LARYNGOSCOPY;  Surgeon: Christia Reading, MD;  Location: [facility] OR;  Service: ENT;  Laterality: N/A;  micro direct laryngoscopy with prolaryn injection/jet venturi ventilation  . Cardiac catheterization N/A 01/23/2016    Procedure: Left Heart Cath and Cors/Grafts Angiography;  Surgeon: Lyn Records, MD;  Location: [facility] INVASIVE CV LAB;  Service: Cardiovascular;  Laterality: N/A;     Allergies  No Known Allergies   Home Medications  Prior to Admission medications   Medication Sig Start Date End Date Taking? Authorizing Provider  albuterol (PROVENTIL HFA;VENTOLIN HFA) 108 (90 BASE) MCG/ACT inhaler Inhale 2 puffs into the lungs every 4 (four) hours as needed for wheezing or shortness of breath. 01/20/15  Yes Tammy S Parrett, NP  amitriptyline (ELAVIL) 75 MG tablet Take 2 tablets at bedtime   Yes Historical Provider, MD  Ascorbic Acid (VITAMIN C) 500 MG tablet Take 500 mg by mouth daily.     Yes Historical Provider, MD  aspirin 81 MG tablet Take 81 mg by mouth daily.   Yes Historical Provider, MD  atorvastatin (LIPITOR) 80 MG tablet TAKE 1 TABLET(80 MG) BY MOUTH DAILY 10/19/15  Yes Rollene Rotunda, MD  B-D TB SYRINGE 1CC/27GX1/2" 27G X 1/2" 1 ML MISC Reported on 10/17/2015 02/18/15  Yes Historical Provider, MD  Carbonyl Iron (PERFECT IRON PO) Take 1 tablet by mouth every morning.    Yes Historical Provider, MD  clobetasol cream (TEMOVATE) 0.05 % Apply 1 application topically 2 (two) times daily. 10/19/15  Yes Wanda Plump, MD  colchicine 0.6 MG tablet Take 1 tablet (0.6 mg total) by mouth 2 (two) times daily. 01/23/16 04/24/16 Yes Alexa Lucrezia Starch, MD  Fluticasone-Salmeterol (ADVAIR DISKUS) 100-50 MCG/DOSE AEPB Inhale 1  puff into the lungs 2 (two) times daily. 12/12/15  Yes Tammy S Parrett, NP  folic acid (FOLVITE) 800 MCG tablet Take 800 mcg by mouth daily.   Yes Historical Provider, MD  furosemide (LASIX) 20 MG tablet Take 2 tablets in the morning and 1 tablets at 4 pm 07/17/15  Yes Rollene Rotunda, MD  glucose blood (ONE TOUCH ULTRA TEST) test strip Test blood sugar no more than twice daily. 02/13/15  Yes Wanda Plump, MD  Loteprednol Etabonate (LOTEMAX) 0.5 % GEL Place 1 drop into both eyes every morning. daily 07/01/14  Yes Historical Provider, MD  Methotrexate Sodium, PF, 50 MG/2ML SOLN Inject 25 mg into the skin. Once a week. Patient not sure of dose. Usually take on Thursdays 06/23/14  Yes Historical Provider, MD  metoprolol tartrate (LOPRESSOR) 25 MG tablet Take 1 tablet (25 mg total) by mouth 2 (two) times daily. 07/17/15  Yes Rollene Rotunda, MD  Misc. Devices (ACAPELLA) MISC Use as directed 12/20/13  Yes [name], MD  nitroGLYCERIN (NITROSTAT) 0.4 MG SL tablet Place 1 tablet (0.4 mg total) under the tongue every 5 (five) minutes as needed for chest pain. 11/15/13  Yes Rollene Rotunda, MD  NON FORMULARY [place] PORTABLE PULSE DOSE OXYGEN oxygen 5L per nasal cannula 24 hours a day Dx: 496   Yes Historical Provider, MD  oxyCODONE-acetaminophen (PERCOCET) 7.5-325 MG per tablet Take 1 tablet by mouth 3 (three) times daily. 3 times a day   Yes Historical Provider, MD  pantoprazole (PROTONIX) 40 MG tablet Take 1 tablet (40 mg total) by mouth 2 (two) times daily. 01/13/15  Yes Hart Carwin, MD  potassium chloride (K-DUR,KLOR-CON) 10 MEQ tablet Take two (4) tablets (40 meq total) by mouth every morning and take one (2) tablet (20 meq total) by mouth every evening. 07/28/15  Yes Rollene Rotunda, MD  predniSONE (DELTASONE) 5 MG tablet Take 5 mg by mouth daily. Take as directed   Yes Historical Provider, MD  Respiratory Therapy Supplies (FLUTTER) DEVI 1 Inhaler by Does not apply route 2 (two) times daily. 05/18/15  Yes [name], MD  tiotropium (SPIRIVA HANDIHALER) 18 MCG inhalation capsule INHALE 1 CAPSULE BY MOUTH IN HANDIHALER DAILY 12/12/15  Yes Tammy S Parrett, NP  vitamin B-12 (CYANOCOBALAMIN) 250 MCG tablet Take 250 mcg by mouth daily.     Yes Historical Provider, MD    Family History  Family History  Problem Relation Age of Onset  . Heart disease Mother   . Diverticulitis Mother   . Heart disease Father   . Heart attack Father     x2  . Colon cancer Neg Hx   . Prostate cancer Neg Hx    Family Status  Relation Status Death Age  . Mother Deceased   . Father Deceased   . Maternal Grandmother Deceased   . Maternal Grandfather Deceased   . Paternal Grandmother Deceased   . Paternal Grandfather Deceased      Social History  Social History   Social History  . Marital Status: Married    Spouse Name: N/A  . Number of Children: 0  . Years of Education: N/A   Occupational History  . disabled-retired    Social History Main Topics  . Smoking status: Former Smoker -- 1.00 packs/day for 40 years    Quit date: 08/26/2005  . Smokeless tobacco: Former Neurosurgeon    Quit date: 04/02/2006     Comment: smoked x 40 years, 1 ppd  . Alcohol Use: No  . Drug Use: No  . Sexual Activity: Not on file   Other Topics Concern  . Not on file   Social History Narrative     All other systems reviewed and are otherwise negative except as noted above.  Physical Exam  Blood pressure 120/72, pulse 81, temperature 98 F (36.7 C), temperature source Oral, resp. rate 17, weight 219 lb 12.8 oz (99.701 kg), SpO2 94 %.  General: Pleasant, NAD Psych: Normal affect. Neuro: Alert and oriented X 3. Moves all extremities spontaneously. HEENT: Normal  Neck: Supple without bruits or JVD. Lungs:  Resp regular and unlabored, CTA. Heart: RRR no s3, s4, or murmurs. Abdomen: Soft, non-tender, non-distended, BS + x 4.  Extremities: No clubbing, cyanosis or edema. DP/PT/Radials 2+ and  equal bilaterally.  Labs  No  results for input(s): CKTOTAL, CKMB, TROPONINI in the last 72 hours. Lab Results  Component Value Date   WBC 5.3 02/07/2016   HGB 13.2 02/07/2016   HCT 39.9 02/07/2016   MCV 91.1 02/07/2016   PLT 204 02/07/2016    Recent Labs Lab 02/07/16 1100  NA 139  K 3.9  CL 103  CO2 26  BUN 11  CREATININE 0.69  CALCIUM 9.6  GLUCOSE 178*   Lab Results  Component Value Date   CHOL 136 07/11/2015   HDL 35.50* 07/11/2015   LDLCALC 71 07/11/2015   TRIG 146.0 07/11/2015      Radiology/Studies  Dg Chest 2 View  02/07/2016  CLINICAL DATA:  Shortness of breath for 2 days.  History of COPD. EXAM: CHEST  2 VIEW COMPARISON:  PA and lateral chest and CT chest 01/19/2016. FINDINGS: The lungs are severely emphysematous. Bibasilar scarring or fibrosis is identified. No consolidative process, pneumothorax or effusion. Heart size is normal. The patient is status post CABG. IMPRESSION: Marked emphysema without acute disease. Electronically Signed   By: Drusilla Kanner M.D.   On: 02/07/2016 11:34   Dg Chest 2 View  01/19/2016  CLINICAL DATA:  Left-sided chest pain for 2 days EXAM: CHEST  2 VIEW COMPARISON:  05/02/2015 FINDINGS: Cardiac shadow is stable. Postoperative changes are again seen. Emphysematous changes are seen similar to that noted on prior CT examination. Nodular changes seen on the prior exam are not well appreciated on this study. No focal confluent infiltrate is seen. No bony abnormality is noted. IMPRESSION: Chronic emphysematous changes with scarring. No acute abnormality is seen. Electronically Signed   By: Alcide Clever M.D.   On: 01/19/2016 18:45   Ct Angio Chest Pe W/cm &/or Wo Cm  01/19/2016  CLINICAL DATA:  Left-sided chest pain EXAM: CT ANGIOGRAPHY CHEST WITH CONTRAST TECHNIQUE: Multidetector CT imaging of the chest was performed using the standard protocol during bolus administration of intravenous contrast. Multiplanar CT image reconstructions and MIPs were obtained to evaluate the  vascular anatomy. CONTRAST:  100 mL Isovue 370. COMPARISON:  05/02/2015 FINDINGS: Severe emphysematous changes are identified and stable from the prior exam. These are concentrated predominately in the upper lobes bilaterally. Along the minor fissure there is some thickening associated with some of the emphysematous bulla this likely represents some progressive scarring. No focal infiltrate is seen. Previously seen small nodules are again identified stable in appearance from the prior exam. Thoracic inlet is within normal limits. The thoracic aorta and its branches demonstrates some calcific changes. Changes of prior coronary bypass grafting are noted. No aneurysmal dilatation or dissection. The pulmonary artery demonstrates a normal branching pattern. No filling defects to suggest pulmonary emboli are identified. No hilar or mediastinal adenopathy are noted. The visualized upper abdomen is within normal limits. The osseous structures show degenerative change of the thoracic spine. Review of the MIP images confirms the above findings. IMPRESSION: No evidence of pulmonary emboli. Stable small pulmonary nodules consistent with benign etiology. Significant emphysematous changes. Some thickening is noted along 1 of the bulla in the right upper lobe likely related to progressive scarring. Electronically Signed   By: Alcide Clever M.D.   On: 01/19/2016 20:15   Cardiac Cath 01/23/16:  Mid RCA lesion, 95% stenosed.  Ost 1st Mrg lesion, 100% stenosed.  Mid Cx lesion, 99% stenosed.  Prox LAD lesion, 95% stenosed.  Ost Ramus to Ramus lesion, 100% stenosed.  Ost 1st Diag to 1st Diag  lesion, 100% stenosed.  Mid LAD lesion, 99% stenosed.  Ost 2nd Diag lesion, 100% stenosed.  Radial artery .  SVG .  Prox Graft lesion, 100% stenosed.  Occluded saphenous vein graft to the diagonal. It is presumed that the saphenous vein graft was fashioned off the hood of the radial graft to the obtuse marginal.  Patent  free left radial artery to the obtuse marginal, patent SVG to the distal RCA of the left circumflex, and widely patent LIMA to the distal LAD.  Severe native vessel disease with total occlusion of several large diagonals filling late by collaterals. High-grade obstruction in the nondominant right coronary. High-grade obstruction of the proximal and mid LAD with total occlusion distally.  Mid to distal anterior wall akinesis in the distribution of the occluded diagonals. LVEF 40%. Normal LVEDP.  Chest pain of pleuritic quality.  RECOMMENDATIONS:   Continued medical therapy. __ ______   TTE 5/28: Study Conclusions - Left ventricle: The cavity size was normal. There was mild  concentric hypertrophy. Systolic function was normal. The  estimated ejection fraction was in the range of 50% to 55%. Wall  motion was normal; there were no regional wall motion  abnormalities. Doppler parameters are consistent with abnormal  left ventricular relaxation (grade 1 diastolic dysfunction).  There was no evidence of elevated ventricular filling pressure by  Doppler parameters. - Aortic valve: Trileaflet; moderately thickened, moderately  calcified leaflets. There was very mild stenosis. - Left atrium: The atrium was at the upper limits of normal in  size. - Right ventricle: The cavity size was mildly dilated. Wall  thickness was normal. Systolic function was mildly reduced. - Tricuspid valve: There was mild regurgitation. - Pulmonary arteries: Systolic pressure was within the normal  range. - Inferior vena cava: The vessel was normal in size Impressions: - Low normal LVEF, grade 1 diastolic dysfunction with normal  filling pressures.  Mildly dilated RVEF with mild RV systolic dysfunction.  Normal RVSP.   ECG  NSR HR 94 with low voltage QRS  ASSESSMENT AND PLAN  Joseph [name] is a 65 y.o. male with a history of CAD s/p CABG (2007, LIMA-LAD, left radial-OM, SVG-D1,  SVG-PDA), DM with neuropathy, HTN, COPD on 3L [place], recurrent pneumonia secondary to chronic aspiration/reflux, RA, and psoriasis and recent admission for possible pericarditis who presents to [facility] today with pleuritic chest pain similar to recent admission.   Chest pain: given pleurisy and flat trend of mildly elevated troponins last admission, the diagnosis of myopericarditis was favored. CTA was negative for PE. However, there were no classic ST elevations or PR depressions seen on EKG. Patient was started on colchicine and ibuprofen with improvement followed by resolution of chest pain. Patient was discharged on colchicine 0.6 mg BID for 3 months (stop date 04/24/16) and ibuprofen 400 mg BID for 2 weeks (stop date 02/03/16). Patient was continued on prednisone 5 mg daily for RA.   He now returns with chest pain similar to previous but on opposite side. He had dramatic improvement today it the ER after 600mg  ibuprofen. Troponin neg x1 and ECG with no acute ST or TW changes. Patient can likely go home today on a longer course of NSAID therapy. Continue colchicine 0.6mg  BID. He has follow up with PA-C on 02/13/16 and with his rheumatologist next week. Since this may be rheumatalgically mediated, he may benefit from steroid therapy if he fails a second round of NSAIDs.   CAD s/p CABG in 2007: Patient has a  history of CABG 10 years ago, and has done functionally well since then. Cardiac cath on 01/23/16 showed occluded saphenous vein graft to diagonal, patent free left radial artery to the obtuse marginal, patent SVG to distal RCA of left Cx, widely patent LIMA to distal LAD, severe native vessel disease with total occlusion of several large diagonals. High-grade obstruction in nondominant RCA and high grade obstruction of proximal and mid LAD with total occlusion distally. Recommendations were for continued medical therapy.Continue ASA 81 mg daily, atorvastatin 80 mg daily, metoprolol 25 mg BID, and  Nitro prn.  Chronic HFpEF: Echo on 01/21/16 showed EF of 50-55% and grade 1 diastolic dysfunction with normal filling pressures, mild RV systolic dysfunction. Continue home regimen of Lasix 40 mg QAM and 20 mg QPM.  RA and Psoriasis: Patient is on methotrexate and prednisone 5 mg daily at home. Plan to continue these  GERD: continue home protonix 40 mg BID   COPD on 3L [place] : Patient is on albuterol prn, advair BIDand spiriva at home.  Signed, Cline Crock, PA-C 02/07/2016, 2:53 PM  Pager [phone]  History and all data above reviewed.  Patient examined.  I agree with the findings as above.  The patient has chest pain similar to his previous pericarditis.  Had recent cath as above.  No evidence of ischemia at this point.   The patient exam reveals COR:RRR, no rub  ,  Lungs: Clear  ,  Abd: Positive bowel sounds, no rebound no guarding, Ext No edema   .  All available labs, radiology testing, previous records reviewed. Agree with documented assessment and plan. Chest pain:  Probable pericarditis.  OK to discharge on a longer course of ibuprofen.  He can take 400 mg bid x at least one month.  No further imaging planned.  Continue colchicine.    Fayrene Fearing Jearldine Cassady  4:33 PM  02/07/2016

## 2016-02-07 NOTE — ED Notes (Signed)
Pt here for rigfht sided chest pain more under breast and rib area since last night. sts here recently and admitted for pericarditis. Denies cough.

## 2016-02-07 NOTE — Telephone Encounter (Signed)
Late entry. called 9:00a Returned patient call and discussed pain he's had.  Pt states right sided pain which began last night. Similar to his pericarditis pain (hospitalized in May).  He took NTG which did not relieve pain. He notes the pain is worse on deep inspiration.  He states no fever, sweating, etc. Denies pain elsewhere.  He denies SOB is worse than usual (hx COPD) but states the pain is worse than the pericarditis he was hospitalized for 1 month ago. Advised patient to go to ER - he states he will have driver take him to [facility] for evaluation there. I have contacted Trish, cardmaster, she is aware of pending arrival.

## 2016-02-07 NOTE — ED Notes (Signed)
Cardiology at bedside.

## 2016-02-07 NOTE — ED Notes (Signed)
Placed patient into gown and on the monitor 

## 2016-02-07 NOTE — Telephone Encounter (Signed)
pt calling c/o sharp pain right side near arm pit to rib cage-same pain as when he had Pericarditis -pls call [phone]

## 2016-02-09 ENCOUNTER — Telehealth: Payer: Self-pay | Admitting: Cardiology

## 2016-02-09 NOTE — Telephone Encounter (Signed)
Pt changed their mind about sending a message.

## 2016-02-13 ENCOUNTER — Encounter: Payer: Self-pay | Admitting: Physician Assistant

## 2016-02-13 ENCOUNTER — Ambulatory Visit (INDEPENDENT_AMBULATORY_CARE_PROVIDER_SITE_OTHER): Payer: BC Managed Care – PPO | Admitting: Physician Assistant

## 2016-02-13 VITALS — BP 102/64 | HR 76 | Ht 72.0 in | Wt 223.0 lb

## 2016-02-13 DIAGNOSIS — I2510 Atherosclerotic heart disease of native coronary artery without angina pectoris: Secondary | ICD-10-CM

## 2016-02-13 DIAGNOSIS — I309 Acute pericarditis, unspecified: Secondary | ICD-10-CM | POA: Diagnosis not present

## 2016-02-13 NOTE — Progress Notes (Signed)
Cardiology Office Note   Date:  02/13/2016   ID:  Joseph [name], DOB 03-31-1951, MRN [id number]  PCP:  Willow Ora, MD  Cardiologist:  Dr Luna Kitchens, PA-C   Chief Complaint  Patient presents with  . Hospitalization Follow-up    History of Present Illness: Joseph [name] is a 65 y.o. male with a history of CABG (2007, LIMA-LAD, left radial-OM, SVG-D1, SVG-PDA), DM with neuropathy, HTN, COPD on 3L [place], recurrent pneumonia secondary to chronic aspiration/reflux, RA, and psoriasis.   D/c 05/30 after admission for possible pericarditis. Cardiac cath on 01/23/16 showed occluded saphenous vein graft to diagonal, patent free left radial artery to the obtuse marginal, patent SVG to distal RCA of left Cx, widely patent LIMA to distal LAD, severe native vessel disease with total occlusion of several large diagonals. High-grade obstruction in nondominant RCA and high grade obstruction of proximal and mid LAD with total occlusion distally. Recommendations were for continued medical therapy. D/c on colchicine and ibuprofen.  ER visit 06/14 for chest pain, felt 2nd pericarditis, longer course of ibuprofen recommended.  Joseph [name] presents for followup of his hospital stay and ER visit.  He has not had any chest pain and a couple of days. Except for the chest pain, he has been doing fairly well. His rheumatologist treats his rheumatoid arthritis with methotrexate weekly and prednisone 5 mg a day. The prednisone dose has been much higher at times based on whether he was having a flare of his rheumatoid arthritis or not.  He has not had any chest pain from angina. He and his wife have multiple questions about which of his grafts was occluded and was spent explaining the diagram they were given and results of the heart catheterization.  He has been very uncomfortable from the pericarditis and wants to make sure that the pericarditis pain does not come back. He has had some diarrhea,  possibly from the colchicine. He has not had any other issues in his rheumatoid arthritis is at baseline. He was to know when he can go back to the gym. He was exercising regularly until he got the chest pain.    Past Medical History  Diagnosis Date  . Psoriasis   . Hypertension   . Gastroesophageal reflux disease   . COPD (chronic obstructive pulmonary disease) (HCC)   . CAD (coronary artery disease)     MI 2007, CABG  . Osteoporosis     per DEXA 05-2008  . Peripheral polyneuropathy (HCC)     Severe, causing problems with his feet, see surgeries  . Diabetes mellitus     Dx 07-2008 A1C 6.2  . Onychomycosis     toenails  . Hiatal hernia   . Esophageal dysmotility   . Gastroparesis   . Fatty liver 10/11/09  . Neuropathy (HCC)     (related to RA per neuro note 05-2011)  . Oxygen dependent     3l  . Complication of anesthesia     DIFFICULTY AFTER CABG HAD CONFUSION POST OP  . Cataracts, bilateral   . Rheumatoid arthritis [facility])     [facility] Rheumatology    Past Surgical History  Procedure Laterality Date  . Foot surgery      to tendon release and repair of osteomyelitis   . Cholecystectomy    . Coronary artery bypass graft  2007    (LIMA to the LAD, left radial to obtuse marginal, SVG to first diagonal, SVG to PDA. His ejection  fraction to 50-55%  . Achiles tendon surgery  8/09  . Toe amputation  4-12/ 3 14    d/t a deformity, found to have osteomyelitis, complicated by post-op foot strtess FX  . Esophagogastroduodenoscopy N/A 10/19/2012    Procedure: ESOPHAGOGASTRODUODENOSCOPY (EGD);  Surgeon: Hart Carwin, MD;  Location: [facility] ENDOSCOPY;  Service: Endoscopy;  Laterality: N/A;  the dilatation is possible.   . Laryngoscopy  01/21/2014     DR BATES   . Laryngoscopy N/A 01/21/2014    Procedure: LARYNGOSCOPY;  Surgeon: Christia Reading, MD;  Location: [facility] OR;  Service: ENT;  Laterality: N/A;  micro direct laryngoscopy with prolaryn injection/jet venturi ventilation  . Cardiac  catheterization N/A 01/23/2016    Procedure: Left Heart Cath and Cors/Grafts Angiography;  Surgeon: Lyn Records, MD;  Location: [facility] INVASIVE CV LAB;  Service: Cardiovascular;  Laterality: N/A;    Current Outpatient Prescriptions  Medication Sig Dispense Refill  . albuterol (PROVENTIL HFA;VENTOLIN HFA) 108 (90 BASE) MCG/ACT inhaler Inhale 2 puffs into the lungs every 4 (four) hours as needed for wheezing or shortness of breath. 1 Inhaler 6  . amitriptyline (ELAVIL) 75 MG tablet Take 2 tablets at bedtime    . Ascorbic Acid (VITAMIN C) 500 MG tablet Take 500 mg by mouth daily.      Marland Kitchen aspirin 81 MG tablet Take 81 mg by mouth daily.    Marland Kitchen atorvastatin (LIPITOR) 80 MG tablet TAKE 1 TABLET(80 MG) BY MOUTH DAILY 90 tablet 2  . B-D TB SYRINGE 1CC/27GX1/2" 27G X 1/2" 1 ML MISC Reported on 10/17/2015    . Carbonyl Iron (PERFECT IRON PO) Take 1 tablet by mouth every morning.     . clobetasol cream (TEMOVATE) 0.05 % Apply 1 application topically 2 (two) times daily.    . colchicine 0.6 MG tablet Take 1 tablet (0.6 mg total) by mouth 2 (two) times daily. 180 tablet 0  . Fluticasone-Salmeterol (ADVAIR DISKUS) 100-50 MCG/DOSE AEPB Inhale 1 puff into the lungs 2 (two) times daily. 60 each 5  . folic acid (FOLVITE) 800 MCG tablet Take 800 mcg by mouth daily.    . furosemide (LASIX) 20 MG tablet Take 2 tablets in the morning and 1 tablets at 4 pm 270 tablet 3  . glucose blood (ONE TOUCH ULTRA TEST) test strip Test blood sugar no more than twice daily. 100 each 12  . ibuprofen (ADVIL,MOTRIN) 400 MG tablet Take 1 tablet (400 mg total) by mouth 2 (two) times daily. 60 tablet 0  . Loteprednol Etabonate (LOTEMAX) 0.5 % GEL Place 1 drop into both eyes every morning. daily    . Methotrexate Sodium, PF, 50 MG/2ML SOLN Inject 25 mg into the skin. Once a week. Patient not sure of dose. Usually take on Thursdays    . metoprolol tartrate (LOPRESSOR) 25 MG tablet Take 1 tablet (25 mg total) by mouth 2 (two) times daily. 180  tablet 3  . Misc. Devices (ACAPELLA) MISC Use as directed 1 each 0  . nitroGLYCERIN (NITROSTAT) 0.4 MG SL tablet Place 1 tablet (0.4 mg total) under the tongue every 5 (five) minutes as needed for chest pain. 25 tablet 1  . NON FORMULARY [place]ERGO PORTABLE PULSE DOSE OXYGEN oxygen 5L per nasal cannula 24 hours a day Dx: 496    . oxyCODONE-acetaminophen (PERCOCET) 7.5-325 MG per tablet Take 1 tablet by mouth 3 (three) times daily. 3 times a day    . pantoprazole (PROTONIX) 40 MG tablet Take 1 tablet (40 mg total) by  mouth 2 (two) times daily. 180 tablet 3  . potassium chloride (K-DUR,KLOR-CON) 10 MEQ tablet Take two (4) tablets (40 meq total) by mouth every morning and take one (2) tablet (20 meq total) by mouth every evening. 180 tablet 11  . predniSONE (DELTASONE) 5 MG tablet Take 5 mg by mouth daily. Take as directed    . Respiratory Therapy Supplies (FLUTTER) DEVI 1 Inhaler by Does not apply route 2 (two) times daily. 1 each 0  . tiotropium (SPIRIVA HANDIHALER) 18 MCG inhalation capsule INHALE 1 CAPSULE BY MOUTH IN HANDIHALER DAILY 30 capsule 5  . vitamin B-12 (CYANOCOBALAMIN) 250 MCG tablet Take 250 mcg by mouth daily.      . [DISCONTINUED] omeprazole (PRILOSEC OTC) 20 MG tablet Take 1 tablet (20 mg total) by mouth daily. 60 tablet 0   No current facility-administered medications for this visit.    Allergies:   Review of patient's allergies indicates no known allergies.    Social History:  The patient  reports that he quit smoking about 10 years ago. He quit smokeless tobacco use about 9 years ago. He reports that he does not drink alcohol or use illicit drugs.   Family History:  The patient's family history includes Diverticulitis in his mother; Heart attack in his father; Heart disease in his father and mother. There is no history of Colon cancer or Prostate cancer.    ROS:  Please see the history of present illness. All other systems are reviewed and negative.    PHYSICAL EXAM: VS:   BP 102/64 mmHg  Pulse 76  Ht 6' (1.829 m)  Wt 223 lb (101.152 kg)  BMI 30.24 kg/m2 , BMI Body mass index is 30.24 kg/(m^2). GEN: Well nourished, well developed, male in no acute distress HEENT: normal for age  Neck: no JVD, no carotid bruit, no masses Cardiac: RRR; 2/6 murmur, no rubs, or gallops Respiratory:  clear to auscultation bilaterally, normal work of breathing GI: soft, nontender, nondistended, + BS MS: Multiple chronic joint deformities; no edema; distal pulses are 2+ in all 4 extremities  Skin: warm and dry, no rash Neuro:  Strength and sensation are intact Psych: euthymic mood, full affect   EKG:  EKG is ordered today. The ekg ordered today demonstrates sinus rhythm with occasional PVCs no acute ischemic changes   Recent Labs: 01/18/2016: ALT 24 01/20/2016: B Natriuretic Peptide 51.7; Magnesium 1.9 02/07/2016: BUN 11; Creatinine, Ser 0.69; Hemoglobin 13.2; Platelets 204; Potassium 3.9; Sodium 139    Lipid Panel    Component Value Date/Time   CHOL 136 07/11/2015 1351   TRIG 146.0 07/11/2015 1351   HDL 35.50* 07/11/2015 1351   CHOLHDL 4 07/11/2015 1351   VLDL 29.2 07/11/2015 1351   LDLCALC 71 07/11/2015 1351     Wt Readings from Last 3 Encounters:  02/13/16 223 lb (101.152 kg)  02/07/16 219 lb 12.8 oz (99.701 kg)  01/23/16 218 lb 8 oz (99.111 kg)     Other studies Reviewed: Additional studies/ records that were reviewed today include: Previous office notes and hospital records.  ASSESSMENT AND PLAN:  1.  Pericarditis: I advised that the most likely etiology was his RA. Advised that we could cut back the colchicine to daily because the diarrhea but he is worried about the pain recurring so he prefers not to do that right now. Tomorrow, he has an appointment with his rheumatologist in [place]. I advised that I was a little uncomfortable with him being on colchicine, Advil, methotrexate, and  prednisone. If the rheumatologist wishes to make any changes, that is  fine with [place]. He did not have any pericardial effusion on his echocardiogram.  I will defer further management of his pericarditis to the rheumatologist.  2. CAD: One of his grafts is occluded, but the area of heart muscle affect is very small. He and his wife are reassured that this will not weaken his heart or shortness like spent. He is on aspirin, high-dose statin, metoprolol and sublingual nitroglycerin when necessary. Currently, he is not having any ischemic symptoms. His weight is up 4 pounds on the scales, but he does not feel volume overloaded and feels his respiratory status is at baseline. Blood pressure and heart rate are well controlled. No med changes.   Current medicines are reviewed at length with the patient today.  The patient does not have concerns regarding medicines.  The following changes have been made:  no change  Labs/ tests ordered today include: ECG    Disposition:   FU with Dr. Antoine Poche  Signed, Theodore Demark, PA-C  02/13/2016 2:48 PM    [facility] Phone: [phone]; Fax: [phone]  This note was written with the assistance of speech recognition software. Please excuse any transcriptional errors.

## 2016-02-13 NOTE — Patient Instructions (Addendum)
Your physician recommends that you schedule a follow-up appointment in: 3 Months with Dr Antoine Poche  It's ok to take 1 colchicine a day

## 2016-02-16 ENCOUNTER — Encounter: Payer: Self-pay | Admitting: Internal Medicine

## 2016-02-16 ENCOUNTER — Ambulatory Visit (INDEPENDENT_AMBULATORY_CARE_PROVIDER_SITE_OTHER): Payer: BC Managed Care – PPO | Admitting: Internal Medicine

## 2016-02-16 VITALS — BP 118/74 | HR 73 | Temp 97.9°F | Ht 72.0 in | Wt 222.0 lb

## 2016-02-16 DIAGNOSIS — M810 Age-related osteoporosis without current pathological fracture: Secondary | ICD-10-CM | POA: Diagnosis not present

## 2016-02-16 DIAGNOSIS — I319 Disease of pericardium, unspecified: Secondary | ICD-10-CM | POA: Diagnosis not present

## 2016-02-16 NOTE — Progress Notes (Signed)
Pre visit review using our clinic review tool, if applicable. No additional management support is needed unless otherwise documented below in the visit note. 

## 2016-02-16 NOTE — Progress Notes (Signed)
Subjective:    Patient ID: Joseph [name], male    DOB: 06/28/1951, 65 y.o.   MRN: [id number]  DOS:  02/16/2016 Type of visit - description : f/u Interval history: Since the last office visit, was admitted to the hospital with chest pain, saw cardiology, CT showed no PE, cardiac catheterization ---> one occluded graft, not felt to be the cause for CP. Pericarditis? he was treated with colchicine and ibuprofen. Responded well to treatment although he was seen one time at the ER after the initial admission. Saw his rheumatology yesterday, plan is to continue her colchicine one time daily and ibuprofen twice a day and gradually taper it off.   Review of Systems  At this point no chest pain, no cough Denies nausea vomiting. No abdominal pain, blood in the stools or change in the color of the stools Past Medical History  Diagnosis Date  . Psoriasis   . Hypertension   . Gastroesophageal reflux disease   . COPD (chronic obstructive pulmonary disease) (HCC)   . CAD (coronary artery disease)     MI 2007, CABG  . Osteoporosis     per DEXA 05-2008  . Peripheral polyneuropathy (HCC)     Severe, causing problems with his feet, see surgeries  . Diabetes mellitus     Dx 07-2008 A1C 6.2  . Onychomycosis     toenails  . Hiatal hernia   . Esophageal dysmotility   . Gastroparesis   . Fatty liver 10/11/09  . Neuropathy (HCC)     (related to RA per neuro note 05-2011)  . Oxygen dependent     3l  . Complication of anesthesia     DIFFICULTY AFTER CABG HAD CONFUSION POST OP  . Cataracts, bilateral   . Rheumatoid arthritis [facility])     [facility] Rheumatology    Past Surgical History  Procedure Laterality Date  . Foot surgery      to tendon release and repair of osteomyelitis   . Cholecystectomy    . Coronary artery bypass graft  2007    (LIMA to the LAD, left radial to obtuse marginal, SVG to first diagonal, SVG to PDA. His ejection fraction to 50-55%  . Achiles tendon surgery  8/09  . Toe  amputation  4-12/ 3 14    d/t a deformity, found to have osteomyelitis, complicated by post-op foot strtess FX  . Esophagogastroduodenoscopy N/A 10/19/2012    Procedure: ESOPHAGOGASTRODUODENOSCOPY (EGD);  Surgeon: Hart Carwin, MD;  Location: [facility] ENDOSCOPY;  Service: Endoscopy;  Laterality: N/A;  the dilatation is possible.   . Laryngoscopy  01/21/2014     DR BATES   . Laryngoscopy N/A 01/21/2014    Procedure: LARYNGOSCOPY;  Surgeon: Christia Reading, MD;  Location: [facility] OR;  Service: ENT;  Laterality: N/A;  micro direct laryngoscopy with prolaryn injection/jet venturi ventilation  . Cardiac catheterization N/A 01/23/2016    Procedure: Left Heart Cath and Cors/Grafts Angiography;  Surgeon: Lyn Records, MD;  Location: [facility] INVASIVE CV LAB;  Service: Cardiovascular;  Laterality: N/A;    Social History   Social History  . Marital Status: Married    Spouse Name: N/A  . Number of Children: 0  . Years of Education: N/A   Occupational History  . disabled-retired    Social History Main Topics  . Smoking status: Former Smoker -- 1.00 packs/day for 40 years    Quit date: 08/26/2005  . Smokeless tobacco: Former Neurosurgeon    Quit date: 04/02/2006  Comment: smoked x 40 years, 1 ppd  . Alcohol Use: No  . Drug Use: No  . Sexual Activity: Not on file   Other Topics Concern  . Not on file   Social History Narrative        Medication List       This list is accurate as of: 02/16/16 11:59 PM.  Always use your most recent med list.               ACAPELLA Misc  Use as directed     albuterol 108 (90 Base) MCG/ACT inhaler  Commonly known as:  PROVENTIL HFA;VENTOLIN HFA  Inhale 2 puffs into the lungs every 4 (four) hours as needed for wheezing or shortness of breath.     amitriptyline 75 MG tablet  Commonly known as:  ELAVIL  Take 2 tablets at bedtime     aspirin 81 MG tablet  Take 81 mg by mouth daily.     atorvastatin 80 MG tablet  Commonly known as:  LIPITOR  TAKE 1 TABLET(80 MG) BY  MOUTH DAILY     B-D TB SYRINGE 1CC/27GX1/2" 27G X 1/2" 1 ML Misc  Generic drug:  TUBERCULIN SYR 1CC/27GX1/2"  Reported on 02/16/2016     clobetasol cream 0.05 %  Commonly known as:  TEMOVATE  Apply 1 application topically 2 (two) times daily.     colchicine 0.6 MG tablet  Take 1 tablet (0.6 mg total) by mouth 2 (two) times daily.     Fluticasone-Salmeterol 100-50 MCG/DOSE Aepb  Commonly known as:  ADVAIR DISKUS  Inhale 1 puff into the lungs 2 (two) times daily.     FLUTTER Devi  1 Inhaler by Does not apply route 2 (two) times daily.     folic acid 800 MCG tablet  Commonly known as:  FOLVITE  Take 800 mcg by mouth daily.     furosemide 20 MG tablet  Commonly known as:  LASIX  Take 2 tablets in the morning and 1 tablets at 4 pm     glucose blood test strip  Commonly known as:  ONE TOUCH ULTRA TEST  Test blood sugar no more than twice daily.     ibuprofen 400 MG tablet  Commonly known as:  ADVIL,MOTRIN  Take 1 tablet (400 mg total) by mouth 2 (two) times daily.     LOTEMAX 0.5 % Gel  Generic drug:  Loteprednol Etabonate  Place 1 drop into both eyes every morning. daily     methotrexate (PF) 50 MG/2ML injection  Inject 25 mg into the skin once a week.     metoprolol tartrate 25 MG tablet  Commonly known as:  LOPRESSOR  Take 1 tablet (25 mg total) by mouth 2 (two) times daily.     nitroGLYCERIN 0.4 MG SL tablet  Commonly known as:  NITROSTAT  Place 1 tablet (0.4 mg total) under the tongue every 5 (five) minutes as needed for chest pain.     NON FORMULARY  EVERGO PORTABLE PULSE DOSE OXYGEN oxygen 5L per nasal cannula 24 hours a day Dx: 496     oxyCODONE-acetaminophen 7.5-325 MG tablet  Commonly known as:  PERCOCET  Take 1 tablet by mouth 3 (three) times daily. 3 times a day     pantoprazole 40 MG tablet  Commonly known as:  PROTONIX  Take 1 tablet (40 mg total) by mouth 2 (two) times daily.     PERFECT IRON PO  Take 1 tablet by mouth every morning.  potassium chloride 10 MEQ tablet  Commonly known as:  K-DUR,KLOR-CON  Take two (4) tablets (40 meq total) by mouth every morning and take one (2) tablet (20 meq total) by mouth every evening.     predniSONE 5 MG tablet  Commonly known as:  DELTASONE  Take as directed     tiotropium 18 MCG inhalation capsule  Commonly known as:  SPIRIVA HANDIHALER  INHALE 1 CAPSULE BY MOUTH IN HANDIHALER DAILY     vitamin B-12 250 MCG tablet  Commonly known as:  CYANOCOBALAMIN  Take 250 mcg by mouth daily.     vitamin C 500 MG tablet  Commonly known as:  ASCORBIC ACID  Take 500 mg by mouth daily.           Objective:   Physical Exam BP 118/74 mmHg  Pulse 73  Temp(Src) 97.9 F (36.6 C) (Oral)  Ht 6' (1.829 m)  Wt 222 lb (100.699 kg)  BMI 30.10 kg/m2  SpO2 98% General:   Well developed, well nourished . NAD.  HEENT:  Normocephalic . Face symmetric, atraumatic Lungs:  CTA B Normal respiratory effort, no intercostal retractions, no accessory muscle use. Heart: RRR,  no murmur, no rub.  No pretibial edema bilaterally  MSK: Unchanged from previous Skin: Not pale. Not jaundice Neurologic:  alert & oriented X3.  Speech normal, gait limited by rheumatoid arthritis, assisted  Psych--  Cognition and judgment appear intact.  Cooperative with normal attention span and concentration.  Behavior appropriate. No anxious or depressed appearing.      Assessment & Plan:   Assessment > MSK: --RA --- Dr Sharren Bridge --Psoriasis --Osteoporosis Peripheral neuropathy, severe, several feet surgeries DM HTN Hyperlipidemia Pulmonary: --COPD --Pulmonary fibrosis --O2 24/ 7 --Chronic respiratory failure CAD, CHF, MI and  CABG 2007 CP- CT chest no PE, cath 01-23-16, one occluded graft, rx medical mngmt  dx: question of pericarditis  GI --[place]s --GERD --esophageal dysmotility --Fatty liver Hoarseness , chronic, saw ENT , s/p botox 2015 , helped tempoarrily   PLAN Chest pain: Status  post admission to the hospital last month, eventually DX was pericarditis. Currently on colchicine and ibuprofen 400 mg twice a day. Asx, plans to  decrease ibuprofen over the last 2 weeks. Fortunately he is tolerating nsaids well without GI side effects. Osteoporosis: On chart review, I don't see a DEXA been done, this problem is follow-up by rheumatology, to be sure this issues is not overlooked, rec to discusses his bone health with Dr. Sharren Bridge. RTC 04-2016 as scheduled

## 2016-02-18 NOTE — Assessment & Plan Note (Signed)
Chest pain: Status post admission to the hospital last month, eventually DX was pericarditis. Currently on colchicine and ibuprofen 400 mg twice a day. Asx, plans to  decrease ibuprofen over the last 2 weeks. Fortunately he is tolerating nsaids well without GI side effects. Osteoporosis: On chart review, I don't see a DEXA been done, this problem is follow-up by rheumatology, to be sure this issues is not overlooked, rec to discusses his bone health with Dr. Sharren Bridge. RTC 04-2016 as scheduled

## 2016-02-21 NOTE — ED Provider Notes (Signed)
CSN: [id number]     Arrival date & time 02/07/16  1051 History   First MD Initiated Contact with Patient 02/07/16 1117     Chief Complaint  Patient presents with  . Chest Pain     (Consider location/radiation/quality/duration/timing/severity/associated sxs/prior Treatment) HPI   65 year old male with right-sided chest pain. Patient with recent admission for pericarditis. States that current symptoms feel very similar to this although pain is now on the right side as opposed to the left. Worse with certain movements and deep inspiration. No fevers or chills. No dizziness, lightheadedness or shortness of breath. He reported good relief with NSAIDs previously. Currently on colchicine.  Past Medical History  Diagnosis Date  . Psoriasis   . Hypertension   . Gastroesophageal reflux disease   . COPD (chronic obstructive pulmonary disease) (HCC)   . CAD (coronary artery disease)     MI 2007, CABG  . Osteoporosis     per DEXA 05-2008  . Peripheral polyneuropathy (HCC)     Severe, causing problems with his feet, see surgeries  . Diabetes mellitus     Dx 07-2008 A1C 6.2  . Onychomycosis     toenails  . Hiatal hernia   . Esophageal dysmotility   . Gastroparesis   . Fatty liver 10/11/09  . Neuropathy (HCC)     (related to RA per neuro note 05-2011)  . Oxygen dependent     3l  . Complication of anesthesia     DIFFICULTY AFTER CABG HAD CONFUSION POST OP  . Cataracts, bilateral   . Rheumatoid arthritis [facility])     [facility] Rheumatology   Past Surgical History  Procedure Laterality Date  . Foot surgery      to tendon release and repair of osteomyelitis   . Cholecystectomy    . Coronary artery bypass graft  2007    (LIMA to the LAD, left radial to obtuse marginal, SVG to first diagonal, SVG to PDA. His ejection fraction to 50-55%  . Achiles tendon surgery  8/09  . Toe amputation  4-12/ 3 14    d/t a deformity, found to have osteomyelitis, complicated by post-op foot strtess FX  .  Esophagogastroduodenoscopy N/A 10/19/2012    Procedure: ESOPHAGOGASTRODUODENOSCOPY (EGD);  Surgeon: Hart Carwin, MD;  Location: [facility] ENDOSCOPY;  Service: Endoscopy;  Laterality: N/A;  the dilatation is possible.   . Laryngoscopy  01/21/2014     DR BATES   . Laryngoscopy N/A 01/21/2014    Procedure: LARYNGOSCOPY;  Surgeon: Christia Reading, MD;  Location: [facility] OR;  Service: ENT;  Laterality: N/A;  micro direct laryngoscopy with prolaryn injection/jet venturi ventilation  . Cardiac catheterization N/A 01/23/2016    Procedure: Left Heart Cath and Cors/Grafts Angiography;  Surgeon: Lyn Records, MD;  Location: [facility] INVASIVE CV LAB;  Service: Cardiovascular;  Laterality: N/A;   Family History  Problem Relation Age of Onset  . Heart disease Mother   . Diverticulitis Mother   . Heart disease Father   . Heart attack Father     x2  . Colon cancer Neg Hx   . Prostate cancer Neg Hx    Social History  Substance Use Topics  . Smoking status: Former Smoker -- 1.00 packs/day for 40 years    Quit date: 08/26/2005  . Smokeless tobacco: Former Neurosurgeon    Quit date: 04/02/2006     Comment: smoked x 40 years, 1 ppd  . Alcohol Use: No    Review of Systems  All systems reviewed and  negative, other than as noted in HPI.   Allergies  Review of patient's allergies indicates no known allergies.  Home Medications   Prior to Admission medications   Medication Sig Start Date End Date Taking? Authorizing Provider  albuterol (PROVENTIL HFA;VENTOLIN HFA) 108 (90 BASE) MCG/ACT inhaler Inhale 2 puffs into the lungs every 4 (four) hours as needed for wheezing or shortness of breath. 01/20/15  Yes Tammy S Parrett, NP  amitriptyline (ELAVIL) 75 MG tablet Take 2 tablets at bedtime   Yes Historical Provider, MD  Ascorbic Acid (VITAMIN C) 500 MG tablet Take 500 mg by mouth daily.     Yes Historical Provider, MD  aspirin 81 MG tablet Take 81 mg by mouth daily.   Yes Historical Provider, MD  atorvastatin (LIPITOR) 80 MG tablet  TAKE 1 TABLET(80 MG) BY MOUTH DAILY 10/19/15  Yes Rollene Rotunda, MD  B-D TB SYRINGE 1CC/27GX1/2" 27G X 1/2" 1 ML MISC Reported on 02/16/2016 02/18/15  Yes Historical Provider, MD  Carbonyl Iron (PERFECT IRON PO) Take 1 tablet by mouth every morning.    Yes Historical Provider, MD  clobetasol cream (TEMOVATE) 0.05 % Apply 1 application topically 2 (two) times daily. 10/19/15  Yes Wanda Plump, MD  colchicine 0.6 MG tablet Take 1 tablet (0.6 mg total) by mouth 2 (two) times daily. Patient taking differently: Take 0.6 mg by mouth daily.  01/23/16 04/24/16 Yes Alexa Lucrezia Starch, MD  Fluticasone-Salmeterol (ADVAIR DISKUS) 100-50 MCG/DOSE AEPB Inhale 1 puff into the lungs 2 (two) times daily. 12/12/15  Yes Tammy S Parrett, NP  folic acid (FOLVITE) 800 MCG tablet Take 800 mcg by mouth daily.   Yes Historical Provider, MD  furosemide (LASIX) 20 MG tablet Take 2 tablets in the morning and 1 tablets at 4 pm 07/17/15  Yes Rollene Rotunda, MD  glucose blood (ONE TOUCH ULTRA TEST) test strip Test blood sugar no more than twice daily. Patient not taking: Reported on 02/16/2016 02/13/15  Yes Wanda Plump, MD  Loteprednol Etabonate (LOTEMAX) 0.5 % GEL Place 1 drop into both eyes every morning. daily 07/01/14  Yes Historical Provider, MD  Methotrexate Sodium, PF, 50 MG/2ML SOLN Inject 25 mg into the skin once a week.  06/23/14  Yes Historical Provider, MD  metoprolol tartrate (LOPRESSOR) 25 MG tablet Take 1 tablet (25 mg total) by mouth 2 (two) times daily. 07/17/15  Yes Rollene Rotunda, MD  Misc. Devices (ACAPELLA) MISC Use as directed 12/20/13  Yes [name], MD  nitroGLYCERIN (NITROSTAT) 0.4 MG SL tablet Place 1 tablet (0.4 mg total) under the tongue every 5 (five) minutes as needed for chest pain. Patient not taking: Reported on 02/16/2016 11/15/13  Yes Rollene Rotunda, MD  NON FORMULARY [place]ER[place] PORTABLE PULSE DOSE OXYGEN oxygen 5L per nasal cannula 24 hours a day Dx: 496   Yes Historical Provider, MD  oxyCODONE-acetaminophen  (PERCOCET) 7.5-325 MG per tablet Take 1 tablet by mouth 3 (three) times daily. 3 times a day   Yes Historical Provider, MD  pantoprazole (PROTONIX) 40 MG tablet Take 1 tablet (40 mg total) by mouth 2 (two) times daily. 01/13/15  Yes Hart Carwin, MD  potassium chloride (K-DUR,KLOR-CON) 10 MEQ tablet Take two (4) tablets (40 meq total) by mouth every morning and take one (2) tablet (20 meq total) by mouth every evening. 07/28/15  Yes Rollene Rotunda, MD  predniSONE (DELTASONE) 5 MG tablet Take as directed   Yes Historical Provider, MD  Respiratory Therapy Supplies (FLUTTER) DEVI 1 Inhaler by  Does not apply route 2 (two) times daily. 05/18/15  Yes [name], MD  tiotropium (SPIRIVA HANDIHALER) 18 MCG inhalation capsule INHALE 1 CAPSULE BY MOUTH IN HANDIHALER DAILY 12/12/15  Yes Tammy S Parrett, NP  vitamin B-12 (CYANOCOBALAMIN) 250 MCG tablet Take 250 mcg by mouth daily.     Yes Historical Provider, MD  ibuprofen (ADVIL,MOTRIN) 400 MG tablet Take 1 tablet (400 mg total) by mouth 2 (two) times daily. 02/07/16   Raeford Razor, MD   BP 128/84 mmHg  Pulse 79  Temp(Src) 98 F (36.7 C) (Oral)  Resp 15  Wt 219 lb 12.8 oz (99.701 kg)  SpO2 96% Physical Exam  Constitutional: He appears well-developed and well-nourished. No distress.  HENT:  Head: Normocephalic and atraumatic.  Eyes: Conjunctivae are normal. Right eye exhibits no discharge. Left eye exhibits no discharge.  Neck: Neck supple.  Cardiovascular: Normal rate, regular rhythm and normal heart sounds.  Exam reveals no gallop and no friction rub.   No murmur heard. Pulmonary/Chest: Effort normal and breath sounds normal. No respiratory distress. He exhibits no tenderness.  Abdominal: Soft. He exhibits no distension. There is no tenderness.  Musculoskeletal:  Lower extremities symmetric as compared to each other. No calf tenderness. Negative Homan's. No palpable cords.   Neurological: He is alert.  Skin: Skin is warm and dry.  Psychiatric: He  has a normal mood and affect. His behavior is normal. Thought content normal.  Nursing note and vitals reviewed.   ED Course  Procedures (including critical care time) Labs Review Labs Reviewed  BASIC METABOLIC PANEL - Abnormal; Notable for the following:    Glucose, Bld 178 (*)    All other components within normal limits  CBC  I-STAT TROPOININ, ED    Imaging Review No results found. I have personally reviewed and evaluated these images and lab results as part of my medical decision-making.   EKG Interpretation   Date/Time:  Wednesday February 07 2016 10:54:37 EDT Ventricular Rate:  94 PR Interval:  164 QRS Duration: 76 QT Interval:  340 QTC Calculation: 425 R Axis:   32 Text Interpretation:  Normal sinus rhythm Low voltage QRS Septal infarct ,  age undetermined Abnormal ECG Confirmed by Juleen China  MD, Kirstein Baxley ([id number]) on  02/07/2016 11:51:33 AM      MDM   Final diagnoses:  Chest pain, unspecified chest pain type    65 year old male with right-sided chest pain. Recent admission for pericarditis. Doubt ACS, PE, dissection or other emergent process. Since she was just discharged from the cardiology service, will consult them. This may very well may simply continued pain from his pericarditis. At this point, I have a low suspicion for potential complication such as significant effusion or otherwise.    Raeford Razor, MD 02/21/16 1314

## 2016-02-23 ENCOUNTER — Ambulatory Visit
Admission: RE | Admit: 2016-02-23 | Discharge: 2016-02-23 | Disposition: A | Discharge status: Home / Self Care | Payer: BC Managed Care – PPO | Source: Ambulatory Visit | Attending: Internal Medicine | Admitting: Internal Medicine

## 2016-02-23 ENCOUNTER — Encounter: Payer: Self-pay | Admitting: Internal Medicine

## 2016-02-23 ENCOUNTER — Ambulatory Visit (INDEPENDENT_AMBULATORY_CARE_PROVIDER_SITE_OTHER): Payer: BC Managed Care – PPO | Admitting: Internal Medicine

## 2016-02-23 ENCOUNTER — Telehealth: Payer: Self-pay | Admitting: Internal Medicine

## 2016-02-23 VITALS — BP 130/70 | HR 96 | Ht 72.0 in | Wt 223.0 lb

## 2016-02-23 DIAGNOSIS — R042 Hemoptysis: Secondary | ICD-10-CM | POA: Diagnosis not present

## 2016-02-23 DIAGNOSIS — R918 Other nonspecific abnormal finding of lung field: Secondary | ICD-10-CM | POA: Diagnosis not present

## 2016-02-23 MED ORDER — AZITHROMYCIN 250 MG PO TABS: ORAL_TABLET | ORAL | Status: DC

## 2016-02-23 MED ORDER — IOPAMIDOL (ISOVUE-370) INJECTION 76%: 80.0000 mL | Freq: Once | INTRAVENOUS | Status: DC | PRN

## 2016-02-23 MED ORDER — LEVOFLOXACIN 500 MG PO TABS: 500.0000 mg | ORAL_TABLET | Freq: Every day | ORAL | Status: AC

## 2016-02-23 NOTE — Telephone Encounter (Signed)
CALL REPORT from Clydie Braun @ CT  Pt with likely pna. PE negative. Pt is waiting. Please advise.

## 2016-02-23 NOTE — Patient Instructions (Addendum)
zpak and call if cough not better next week  If bleeding gets worse stop all aspirin until better then resume 81 mg daily   Please see patient coordinator before you leave today  to schedule CTa must be done today  Add: d/c zmax/ levaquin 500mg  daily x 10 days then cxr at 2 weeks Late add CTa c/w pna R > L base ddx also RA/mtx lung dz rec levaquin 500 x10 days and f/u with Dr as planned

## 2016-02-23 NOTE — Progress Notes (Signed)
Subjective:    Patient ID: Joseph [name], male    DOB: Apr 10, 1951, 65 y.o.   MRN: [id number]  HPI 65 yo male former smoker with COPD/emphysema, rheumatoid arthritis, recurrent pneumonia 2nd to chronic aspiration/reflux, bronchiectasis, chronic respiratory failure on 3 liters oxygen 24/7, and pulmonary nodules.  TESTS: PFT 01/12/09 >> FEV1 3.02 (85%), FEV1% 68, TLC 6.35 (88%), DLCO 45%, no BD CT chest 10/17/12 >> severe centrilobular emphysema, apical bullae, 1 cm nodule LUL new, 5 mm nodule RLL stable since 2009, BTX with GGO at bases PFT 12/02/12 >> FEV1 3.11 (78%), FEV1% 77, TLC 6.32 (83%), DLCO 37%, no BD CT chest 05/06/13 >> decreased size LUL nodule now 8 mm, 4 mm nodule RLL, 3 mm nodule RML, 7 mm nodule RML, 8 mm nodule lingula CT chest 04/05/14 >> atherosclerosis, moderate diffuse centrilobular and paraseptal emphysema, stable pulmonary nodules with dominant LUL nodule now 7 mm, steatosis of liver with ?early cirrhosis  PMHx >> RA, Psoriasis, HTN, CAD s/p CABG, GERD, HH, Gastroparesis, DM, Peripheral neuropathy  PSHx, Medications, Allergies, Fhx, Shx reviewed.  12/12/2015 Follow up COPD /O2 dependent  Pt returns for 2 month follow up .  Had COPD flare last ov, tx with augmentin .  He is feeling better. Decreased cough and dyspnea.  Remains on Spiriva and Advair.  On Oxygen 3l/m . Denies any chest pain, orthopnea, PND, increased leg swelling, fever. Patient is followed by rheumatology for his rheumatoid arthritis and remains on methotrexate and chronic  Prednisone.  rec Continue on Spiriva and Advair .  Continue on Oxygen 3l/m     02/23/2016  Acute exteneded  ov/Latecia Miler MO:[id number] / RA on mtx/ prednisone 5 mg daily > rheum eval > no change  Chief Complaint  Patient presents with  . Acute Visit    VS pt. pt c/o coughing up blood last night. sob w/ exertion. body aches & low grade temp 99.8 on 6/27  hx of lateralizing L pleuritic cp> better p motrin /colchicine/ attributed to  pericarditis with neg CTa 01/19/16   But then worse off with new lateralizing pleuritic chest Pain R 02/07/16 back to er with neg cxr > rx more motrin cp resolved again and on motrin 400 mg bid > pain better and flet overall better but bothered by chronic cough that is more green than white and turned bloody last 24 h s epistaxis, fever, recurrent cp of any kind. Coughed up sev tbp of dark rusty colored "blood" day of ov  No obvious day to day or daytime variability or assoc  Sob over baseline  or cp or chest tightness, subjective wheeze or overt sinus or hb symptoms. No unusual exp hx or h/o childhood pna/ asthma or knowledge of premature birth.  Sleeping ok without nocturnal  or early am exacerbation  of respiratory  c/o's or need for noct saba. Also denies any obvious fluctuation of symptoms with weather or environmental changes or other aggravating or alleviating factors except as outlined above   Current Medications, Allergies, Complete Past Medical History, Past Surgical History, Family History, and Social History were reviewed in [employer] record.  ROS  The following are not active complaints unless bolded sore throat, dysphagia, dental problems, itching, sneezing,  nasal congestion or excess/ purulent secretions, ear ache,   fever, chills, sweats, unintended wt loss, classically pleuritic or exertional cp,  orthopnea pnd or leg swelling, presyncope, palpitations, abdominal pain, anorexia, nausea, vomiting, diarrhea  or change in bowel or bladder habits, change  in stools or urine, dysuria,hematuria,  rash, arthralgias, visual complaints, headache, numbness, weakness or ataxia or problems with walking or coordination,  change in mood/affect or memory.                        Objective:   Physical Exam   amb severely hoarse wm nad on nasal 02 at 3lpm   Wt Readings from Last 3 Encounters:  02/23/16 223 lb (101.152 kg)  02/16/16 222 lb (100.699 kg)  02/13/16 223  lb (101.152 kg)    Vital signs reviewed / sats 91 % at rest  on 3lpm which is down from baseline   GEN: A/Ox3; pleasant , NAD chronically ill appearing   HEENT:  [place]/AT,  EACs-clear, TMs-wnl, NOSE-clear, THROAT-clear, no lesions, no postnasal drip or exudate noted.   NECK:  Supple w/ fair ROM; no JVD; normal carotid impulses w/o bruits; no thyromegaly or nodules palpated; no lymphadenopathy.  RESP  Decreased BS in bases no , wheezes/ rales/ or rhonchi.no accessory muscle use, no dullness to percussion  CARD:  RRR, no m/r/g  , no peripheral edema, pulses intact, no cyanosis or clubbing.  GI:   Soft & nt; nml bowel sounds; no organomegaly or masses detected.  Musco: Warm bil, no deformities or joint swelling noted.  Arthritic changes of the hands.  Neuro: alert, no focal deficits noted.    Skin: Warm, no lesions or rashes    I personally reviewed images and agree with radiology impression as follows:  CTa Chest   02/23/2016  No evidence of pulmonary embolism.  Nodular airspace process over the lower lobes right worse than left likely pneumonia. Mild mediastinal adenopathy likely reactive.  Moderate emphysematous disease unchanged. Right apical pleural thickening/scarring.           Assessment & Plan:

## 2016-02-23 NOTE — Telephone Encounter (Signed)
Per MW okay for pt to leave CT Pt aware of recommendations. Pt voices understanding. Nothing further needed.

## 2016-02-25 NOTE — Assessment & Plan Note (Addendum)
Miscellaneous:Alv microlithiasis, alv proteinosis, asp, bronchiectais, BOOP   ARDS/ AIP Occupational dz/ HSP Neoplasm Infection  rec levaquin x 10 days Drug maint rx with MTX noted  Pulmonary emboli, Protein disorders Edema/Eosinophilic dz Sarcoidosis Hist X / Hemorrhage Idiopathic  Collagen Vasc Dz = RA  Not at all clear now that he had pericarditis as the cause of his lateralizing pleuritic cp as the locations reported would be very atypical  as no pericardial effusion on echo or on any CTa including the one today  Will rx as infection for now with f/u in 2 weeks planned by Dr Jamison Neighbor  I had an extended discussion with the patient reviewing all relevant studies completed to date and  lasting 25 minutes of a 40  minute acute office visit    Discussed in detail all the  indications, usual  risks and alternatives  relative to the benefits with patient who agrees to proceed with conservative f/u as outlined    Each maintenance medication was reviewed in detail including most importantly the difference between maintenance and prns and under what circumstances the prns are to be triggered using an action plan format that is not reflected in the computer generated alphabetically organized AVS.    Please see instructions for details which were reviewed in writing and the patient given a copy highlighting the part that I personally wrote and discussed at today's ov.

## 2016-02-25 NOTE — Assessment & Plan Note (Signed)
CTa 02/23/2016 >  No evidence of pulmonary embolism. Nodular airspace process over the lower lobes right worse than left likely pneumonia. Mild mediastinal adenopathy likely reactive. Moderate emphysematous disease unchanged. Right apical pleural Thickening/scarring.  rec levaquin 500 mg daily x 10 days and f/u Dr Jamison Neighbor  See ddx under pulmonary infiltrates

## 2016-02-26 NOTE — Addendum Note (Signed)
Addended by: Evans Lance on: 02/26/2016 04:32 PM   Modules accepted: Orders

## 2016-02-29 ENCOUNTER — Telehealth: Payer: Self-pay | Admitting: Pulmonary Disease

## 2016-02-29 NOTE — Telephone Encounter (Signed)
Spoke with pt. Believes that Levaquin is causing knee pain. States that he has 4 pills left and the knee pain started in the past 2 days. Reports no other symptoms. He also wants to know if he will need blood work done to see if his what kind of PNA he has.  MW - please advise. Thanks.

## 2016-02-29 NOTE — Telephone Encounter (Signed)
Spoke with pt. He is aware of MW's recommendations. Pt verbalized understanding. Nothing further was needed.

## 2016-02-29 NOTE — Telephone Encounter (Signed)
Stop levaquin, will need ov to regroup before additional attempts to rx as out pt - if condition worsens off abx awaiting ov will need to go ER   There is no blood test to sort out pna but let's hope he's had enough levaquin that he turns the corner s having to go to more aggressive rx (like admit/ bronch/ lung bx etc)

## 2016-03-07 ENCOUNTER — Ambulatory Visit (INDEPENDENT_AMBULATORY_CARE_PROVIDER_SITE_OTHER): Payer: BC Managed Care – PPO | Admitting: Acute Care

## 2016-03-07 ENCOUNTER — Ambulatory Visit (INDEPENDENT_AMBULATORY_CARE_PROVIDER_SITE_OTHER)
Admission: RE | Admit: 2016-03-07 | Discharge: 2016-03-07 | Disposition: A | Discharge status: Home / Self Care | Payer: BC Managed Care – PPO | Source: Ambulatory Visit | Attending: Acute Care | Admitting: Acute Care

## 2016-03-07 ENCOUNTER — Encounter: Payer: Self-pay | Admitting: Acute Care

## 2016-03-07 VITALS — BP 112/70 | HR 83 | Ht 72.0 in | Wt 220.2 lb

## 2016-03-07 DIAGNOSIS — J439 Emphysema, unspecified: Secondary | ICD-10-CM | POA: Diagnosis not present

## 2016-03-07 DIAGNOSIS — J9611 Chronic respiratory failure with hypoxia: Secondary | ICD-10-CM | POA: Diagnosis not present

## 2016-03-07 DIAGNOSIS — J189 Pneumonia, unspecified organism: Secondary | ICD-10-CM

## 2016-03-07 NOTE — Patient Instructions (Addendum)
It is great to see you today Continue taking your colchicine as prescribed. Continue your Spiriva and Advair as directed. Continue using your Proventil inhaler for break through shortness of breath.  Continue using your flutter valve for mucus clearance. Follow up with Dr. Craige Cotta in 3 months. Get Flu shot in the fall. Please contact office for sooner follow up if symptoms do not improve or worsen or seek emergency care

## 2016-03-07 NOTE — Assessment & Plan Note (Signed)
Recent Pneumonia with mild exacerbation Plan: Continue Spiriva and Advair Continue oxygen at 3 L Min  Follow up with Dr. Craige Cotta in 3 months Please contact office for sooner follow up if symptoms do not improve or worsen or seek emergency care

## 2016-03-07 NOTE — Progress Notes (Signed)
History of Present Illness Joseph [name] is a 65 y.o. male former smoker with COPD /emphysema, rheumatoid arthritis, recurrent pneumonia 2nd to chronic aspiration/reflux, bronchiectasis, chronic respiratory failure on 3 liters oxygen 24/7, and pulmonary nodules.   7/13/2017Follow Up Appointment: Pt. Presents to the office today for follow up of pneumonia and pleuritic pain. CXR reveals resolution of the pneumonia. He states he feels he is his COPD baseline.His pleuritic pain is better. He was compliant with his ibuprofen and colchicine. He completed his antibiotic and the pain has not recurred. He states that he felt the ibuprofen was the best for his pain relief. He continues to take his prednisone and methotrexate as prescribed for his RA. He is compliant with his Spiriva and Advair. He is no longer coughing up blood. He denies chest pain, fever, purulent sputum orthopnea or hemoptysis.He feels he is back to his normal pulmonary baseline.  Tests IMPRESSION: Post CABG.  COPD changes and extensive chronic lung disease findings as above without acute infiltrate.  Aortic atherosclerosis.  PFT 01/12/09 >> FEV1 3.02 (85%), FEV1% 68, TLC 6.35 (88%), DLCO 45%, no BD CT chest 10/17/12 >> severe centrilobular emphysema, apical bullae, 1 cm nodule LUL new, 5 mm nodule RLL stable since 2009, BTX with GGO at bases PFT 12/02/12 >> FEV1 3.11 (78%), FEV1% 77, TLC 6.32 (83%), DLCO 37%, no BD CT chest 05/06/13 >> decreased size LUL nodule now 8 mm, 4 mm nodule RLL, 3 mm nodule RML, 7 mm nodule RML, 8 mm nodule lingula CT chest 04/05/14 >> atherosclerosis, moderate diffuse centrilobular and paraseptal emphysema, stable pulmonary nodules with dominant LUL nodule now 7 mm, steatosis of liver with ?early cirrhosis  PMHx >> RA, Psoriasis, HTN, CAD s/p CABG, GERD, HH, Gastroparesis, DM, Peripheral neuropathy  Past medical hx Past Medical History  Diagnosis Date  . Psoriasis   . Hypertension   .  Gastroesophageal reflux disease   . COPD (chronic obstructive pulmonary disease) (HCC)   . CAD (coronary artery disease)     MI 2007, CABG  . Osteoporosis     per DEXA 05-2008  . Peripheral polyneuropathy (HCC)     Severe, causing problems with his feet, see surgeries  . Diabetes mellitus     Dx 07-2008 A1C 6.2  . Onychomycosis     toenails  . Hiatal hernia   . Esophageal dysmotility   . Gastroparesis   . Fatty liver 10/11/09  . Neuropathy (HCC)     (related to RA per neuro note 05-2011)  . Oxygen dependent     3l  . Complication of anesthesia     DIFFICULTY AFTER CABG HAD CONFUSION POST OP  . Cataracts, bilateral   . Rheumatoid arthritis (HCC)     [facility]     Past surgical hx, Family hx, Social hx all reviewed.  Current Outpatient Prescriptions on File Prior to Visit  Medication Sig  . albuterol (PROVENTIL HFA;VENTOLIN HFA) 108 (90 BASE) MCG/ACT inhaler Inhale 2 puffs into the lungs every 4 (four) hours as needed for wheezing or shortness of breath.  Marland Kitchen amitriptyline (ELAVIL) 75 MG tablet Take 2 tablets at bedtime  . Ascorbic Acid (VITAMIN C) 500 MG tablet Take 500 mg by mouth daily.    Marland Kitchen aspirin 81 MG tablet Take 81 mg by mouth daily.  Marland Kitchen atorvastatin (LIPITOR) 80 MG tablet TAKE 1 TABLET(80 MG) BY MOUTH DAILY  . B-D TB SYRINGE 1CC/27GX1/2" 27G X 1/2" 1 ML MISC Reported on 02/16/2016  . Carbonyl Iron (  PERFECT IRON PO) Take 1 tablet by mouth every morning.   . clobetasol cream (TEMOVATE) 0.05 % Apply 1 application topically 2 (two) times daily.  . Fluticasone-Salmeterol (ADVAIR DISKUS) 100-50 MCG/DOSE AEPB Inhale 1 puff into the lungs 2 (two) times daily.  . folic acid (FOLVITE) 800 MCG tablet Take 800 mcg by mouth daily.  . furosemide (LASIX) 20 MG tablet Take 2 tablets in the morning and 1 tablets at 4 pm  . glucose blood (ONE TOUCH ULTRA TEST) test strip Test blood sugar no more than twice daily.  . Loteprednol Etabonate (LOTEMAX) 0.5 % GEL Place 1 drop into both  eyes every morning. daily  . Methotrexate Sodium, PF, 50 MG/2ML SOLN Inject 25 mg into the skin once a week.   . metoprolol tartrate (LOPRESSOR) 25 MG tablet Take 1 tablet (25 mg total) by mouth 2 (two) times daily.  . Misc. Devices (ACAPELLA) MISC Use as directed  . nitroGLYCERIN (NITROSTAT) 0.4 MG SL tablet Place 1 tablet (0.4 mg total) under the tongue every 5 (five) minutes as needed for chest pain.  . NON FORMULARY EVERGO PORTABLE PULSE DOSE OXYGEN oxygen 5L per nasal cannula 24 hours a day Dx: 496  . oxyCODONE-acetaminophen (PERCOCET) 7.5-325 MG per tablet Take 1 tablet by mouth 3 (three) times daily. 3 times a day  . pantoprazole (PROTONIX) 40 MG tablet Take 1 tablet (40 mg total) by mouth 2 (two) times daily.  . potassium chloride (K-DUR,KLOR-CON) 10 MEQ tablet Take two (4) tablets (40 meq total) by mouth every morning and take one (2) tablet (20 meq total) by mouth every evening.  . predniSONE (DELTASONE) 5 MG tablet Take as directed  . Respiratory Therapy Supplies (FLUTTER) DEVI 1 Inhaler by Does not apply route 2 (two) times daily.  Marland Kitchen tiotropium (SPIRIVA HANDIHALER) 18 MCG inhalation capsule INHALE 1 CAPSULE BY MOUTH IN HANDIHALER DAILY  . vitamin B-12 (CYANOCOBALAMIN) 250 MCG tablet Take 250 mcg by mouth daily.    . [DISCONTINUED] omeprazole (PRILOSEC OTC) 20 MG tablet Take 1 tablet (20 mg total) by mouth daily.   No current facility-administered medications on file prior to visit.     No Known Allergies  Review Of Systems:  Constitutional:   No  weight loss, night sweats,  Fevers, chills, fatigue, or  lassitude.  HEENT:   No headaches,  Difficulty swallowing,  Tooth/dental problems, or  Sore throat,                No sneezing, itching, ear ache, nasal congestion, post nasal drip,   CV:  No chest pain,  Orthopnea, PND, swelling in lower extremities, anasarca, dizziness, palpitations, syncope.   GI  No heartburn, indigestion, abdominal pain, nausea, vomiting, diarrhea,  change in bowel habits, loss of appetite, bloody stools.   Resp: + shortness of breath with exertion less at rest ( baseline).  + excess mucus ( Baseline), no productive cough,  No non-productive cough,  No coughing up of blood.  No change in color of mucus.  No wheezing.  No chest wall deformity  Skin: no rash or lesions.  GU: no dysuria, change in color of urine, no urgency or frequency.  No flank pain, no hematuria   MS:  + joint pain or swelling.  No decreased range of motion.  No back pain.  Psych:  No change in mood or affect. No depression or anxiety.  No memory loss.   Vital Signs BP 112/70 mmHg  Pulse 83  Ht 6' (1.829 m)  Wt 220 lb 3.2 oz (99.882 kg)  BMI 29.86 kg/m2  SpO2 97%   Physical Exam:  General- No distress,  A&Ox3, obese male ENT: No sinus tenderness, TM clear, pale nasal mucosa, no oral exudate,no post nasal drip, no LAN Cardiac: S1, S2, regular rate and rhythm, no murmur Chest: No wheeze/ rales/ diminished per bases bilaterally.; no accessory muscle use, no nasal flaring, no sternal retractions Abd.: Soft Non-tender Ext: No clubbing cyanosis, edema Neuro:  normal strength Skin: No rashes, warm and dry Psych: normal mood and behavior   Assessment/Plan  Recurrent pneumonia Continue taking your colchicine as prescribed. Continue your Spiriva and Advair as directed. Continue using your Proventil inhaler for break through shortness of breath.  Continue using your flutter valve for mucus clearance. Follow up with Dr. Craige Cotta in 3 months. Get Flu shot in the fall. Please contact office for sooner follow up if symptoms do not improve or worsen or seek emergency care    Chronic respiratory failure Continue on oxygen at 3 L/Min Follow up with Dr. Craige Cotta in 3 months Please contact office for sooner follow up if symptoms do not improve or worsen or seek emergency care   Chronic obstructive pulmonary emphysema Recent Pneumonia with mild  exacerbation Plan: Continue Spiriva and Advair Continue oxygen at 3 L Min  Follow up with Dr. Craige Cotta in 3 months Please contact office for sooner follow up if symptoms do not improve or worsen or seek emergency care      Bevelyn Ngo, NP 03/07/2016  1:22 PM

## 2016-03-07 NOTE — Assessment & Plan Note (Signed)
Continue taking your colchicine as prescribed. Continue your Spiriva and Advair as directed. Continue using your Proventil inhaler for break through shortness of breath.  Continue using your flutter valve for mucus clearance. Follow up with Dr. Craige Cotta in 3 months. Get Flu shot in the fall. Please contact office for sooner follow up if symptoms do not improve or worsen or seek emergency care

## 2016-03-07 NOTE — Progress Notes (Signed)
Reviewed and agree with assessment/plan.  Orville Widmann, MD [facility] 03/07/2016, 3:44 PM Pager:  [phone] 

## 2016-03-07 NOTE — Assessment & Plan Note (Addendum)
Continue on oxygen at 3 L/Min Follow up with Dr. Craige Cotta in 3 months Please contact office for sooner follow up if symptoms do not improve or worsen or seek emergency care

## 2016-03-08 ENCOUNTER — Other Ambulatory Visit: Payer: Self-pay

## 2016-03-08 ENCOUNTER — Telehealth: Payer: Self-pay

## 2016-03-08 MED ORDER — PANTOPRAZOLE SODIUM 40 MG PO TBEC: 40.0000 mg | DELAYED_RELEASE_TABLET | Freq: Two times a day (BID) | ORAL | Status: DC

## 2016-03-08 NOTE — Telephone Encounter (Signed)
Up-to-date reviewed, there is an interaction, they recommend to "monitor therapy". Please call patient: Has he being taking both meds for prolonged periods of times without problems? If that is the case okay to refill x 1 year

## 2016-03-08 NOTE — Telephone Encounter (Signed)
Received fax from [facility] pharmacy for refill on Pantoprazole 40 mg tabs Sig: 1 tablet po bid. Last filled on 01/13/2015 #180 and 3RF by Dr. Juanda Chance. When refilling medication, receive high medication interaction w/ Methotrexate. Please advise.

## 2016-03-08 NOTE — Telephone Encounter (Signed)
Spoke w/ Pt, he informed that he has been taking Pantoprazole "for awhile" with no problems that he knows of. Rx sent to pharmacy.

## 2016-05-09 ENCOUNTER — Ambulatory Visit (INDEPENDENT_AMBULATORY_CARE_PROVIDER_SITE_OTHER): Payer: BC Managed Care – PPO | Admitting: Behavioral Health

## 2016-05-09 DIAGNOSIS — Z23 Encounter for immunization: Secondary | ICD-10-CM | POA: Diagnosis not present

## 2016-05-09 NOTE — Progress Notes (Addendum)
Pre visit review using our clinic review tool, if applicable. No additional management support is needed unless otherwise documented below in the visit note.  Patient in clinic today for Influenza vaccination. IM given in Left Deltoid. Patient tolerated injection well. 

## 2016-05-16 NOTE — Progress Notes (Signed)
HPI The patient presents follow up of CAD with bypass in 2007.   In May he was admitted with chest pain and possible pericarditis.   Cardiac cath on 01/23/16 showed occluded saphenous vein graft to diagonal, patent free left radial artery to the obtuse marginal, patent SVG to distal RCA of left Cx, widely patent LIMA to distal LAD, severe native vessel disease with total occlusion of several large diagonals. High-grade obstruction in nondominant RCA and high grade obstruction of proximal and mid LAD with total occlusion distally. Recommendations were for continued medical therapy. He was discharged on colchicine and ibuprofen.  He was in the ED in June with chest pain and had a longer course of ibuprofen ordered.  He was seen in follow up in June and was doing OK.  Since then he has done well.  The patient denies any new symptoms such as chest discomfort, neck or arm discomfort. There has been no new shortness of breath, PND or orthopnea. There have been no reported palpitations, presyncope or syncope.     No Known Allergies  Current Outpatient Prescriptions  Medication Sig Dispense Refill  . albuterol (PROVENTIL HFA;VENTOLIN HFA) 108 (90 BASE) MCG/ACT inhaler Inhale 2 puffs into the lungs every 4 (four) hours as needed for wheezing or shortness of breath. 1 Inhaler 6  . amitriptyline (ELAVIL) 75 MG tablet Take 2 tablets at bedtime    . Ascorbic Acid (VITAMIN C) 500 MG tablet Take 500 mg by mouth daily.      Marland Kitchen aspirin 81 MG tablet Take 81 mg by mouth daily.    Marland Kitchen atorvastatin (LIPITOR) 80 MG tablet TAKE 1 TABLET(80 MG) BY MOUTH DAILY 90 tablet 2  . B-D TB SYRINGE 1CC/27GX1/2" 27G X 1/2" 1 ML MISC Reported on 02/16/2016    . Carbonyl Iron (PERFECT IRON PO) Take 1 tablet by mouth every morning.     . clobetasol cream (TEMOVATE) 0.05 % Apply 1 application topically 2 (two) times daily.    . colchicine 0.6 MG tablet Take 0.6 mg by mouth daily.    . Fluticasone-Salmeterol (ADVAIR DISKUS) 100-50 MCG/DOSE  AEPB Inhale 1 puff into the lungs 2 (two) times daily. 60 each 5  . folic acid (FOLVITE) 800 MCG tablet Take 800 mcg by mouth daily.    . furosemide (LASIX) 20 MG tablet Take 2 tablets in the morning and 1 tablets at 4 pm 270 tablet 3  . glucose blood (ONE TOUCH ULTRA TEST) test strip Test blood sugar no more than twice daily. 100 each 12  . Loteprednol Etabonate (LOTEMAX) 0.5 % GEL Place 1 drop into both eyes every morning. daily    . Methotrexate Sodium, PF, 50 MG/2ML SOLN Inject 25 mg into the skin once a week.     . metoprolol tartrate (LOPRESSOR) 25 MG tablet Take 1 tablet (25 mg total) by mouth 2 (two) times daily. 180 tablet 3  . Misc. Devices (ACAPELLA) MISC Use as directed 1 each 0  . nitroGLYCERIN (NITROSTAT) 0.4 MG SL tablet Place 1 tablet (0.4 mg total) under the tongue every 5 (five) minutes as needed for chest pain. 25 tablet 1  . NON FORMULARY EVERGO PORTABLE PULSE DOSE OXYGEN oxygen 3L per nasal cannula 24 hours a day Dx: 496    . oxyCODONE-acetaminophen (PERCOCET) 7.5-325 MG per tablet Take 1 tablet by mouth 3 (three) times daily. 3 times a day    . pantoprazole (PROTONIX) 40 MG tablet Take 1 tablet (40 mg total) by mouth 2 (  two) times daily. 180 tablet 3  . potassium chloride (K-DUR,KLOR-CON) 10 MEQ tablet Take two (4) tablets (40 meq total) by mouth every morning and take one (2) tablet (20 meq total) by mouth every evening. 180 tablet 11  . predniSONE (DELTASONE) 5 MG tablet Take as directed    . Respiratory Therapy Supplies (FLUTTER) DEVI 1 Inhaler by Does not apply route 2 (two) times daily. 1 each 0  . tiotropium (SPIRIVA HANDIHALER) 18 MCG inhalation capsule INHALE 1 CAPSULE BY MOUTH IN HANDIHALER DAILY 30 capsule 5  . vitamin B-12 (CYANOCOBALAMIN) 250 MCG tablet Take 250 mcg by mouth daily.       No current facility-administered medications for this visit.     Past Medical History:  Diagnosis Date  . CAD (coronary artery disease)    MI 2007, CABG  . Cataracts,  bilateral   . Complication of anesthesia    DIFFICULTY AFTER CABG HAD CONFUSION POST OP  . COPD (chronic obstructive pulmonary disease) (HCC)   . Diabetes mellitus    Dx 07-2008 A1C 6.2  . Esophageal dysmotility   . Fatty liver 10/11/09  . Gastroesophageal reflux disease   . Gastroparesis   . Hiatal hernia   . Hypertension   . Neuropathy (HCC)    (related to RA per neuro note 05-2011)  . Onychomycosis    toenails  . Osteoporosis    per DEXA 05-2008  . Oxygen dependent    3l  . Peripheral polyneuropathy (HCC)    Severe, causing problems with his feet, see surgeries  . Psoriasis   . Rheumatoid arthritis [facility])    [facility] Rhe[facility]    Past Surgical History:  Procedure Laterality Date  . achiles tendon surgery  8/09  . CARDIAC CATHETERIZATION N/A 01/23/2016   Procedure: Left Heart Cath and Cors/Grafts Angiography;  Surgeon: Lyn Records, MD;  Location: [facility] INVASIVE CV LAB;  Service: Cardiovascular;  Laterality: N/A;  . CHOLECYSTECTOMY    . CORONARY ARTERY BYPASS GRAFT  2007   (LIMA to the LAD, left radial to obtuse marginal, SVG to first diagonal, SVG to PDA. His ejection fraction to 50-55%  . ESOPHAGOGASTRODUODENOSCOPY N/A 10/19/2012   Procedure: ESOPHAGOGASTRODUODENOSCOPY (EGD);  Surgeon: Hart Carwin, MD;  Location: [facility] ENDOSCOPY;  Service: Endoscopy;  Laterality: N/A;  the dilatation is possible.   Marland Kitchen FOOT SURGERY     to tendon release and repair of osteomyelitis   . LARYNGOSCOPY  01/21/2014    DR BATES   . LARYNGOSCOPY N/A 01/21/2014   Procedure: LARYNGOSCOPY;  Surgeon: Christia Reading, MD;  Location: [facility] OR;  Service: ENT;  Laterality: N/A;  micro direct laryngoscopy with prolaryn injection/jet venturi ventilation  . TOE AMPUTATION  4-12/ 3 14   d/t a deformity, found to have osteomyelitis, complicated by post-op foot strtess FX   ROS   As stated in the HPI and negative for all other systems.   PHYSICAL EXAM BP 114/76   Pulse 80   Ht 6' (1.829 m)   Wt 227 lb (103 kg)   BMI  30.79 kg/m  General: Well developed, well nourished, in no acute distress.  Head: normocephalic and atraumatic  Mouth: Teeth, gums and palate normal. Oral mucosa normal.  Neck: Neck supple, no JVD. No masses, thyromegaly or abnormal cervical nodes.  Chest Wall: well-healed sternotomy scar  Lungs: Clear bilaterally to auscultation and percussion.  Heart: S1 and S2 within normal limits, no S3, no S4, no clicks, no rubs, no murmurs  Abdomen: Bowel sounds  positive; abdomen soft and non-tender without masses, organomegaly, or hernias noted. No hepatosplenomegaly, obese  Msk: severe rheumatoid changes  Extremities: trace bilateral edema, his legs are in diabetic boots and I was unable to appreciate posterior tibialis or dorsalis pulses    Lab Results  Component Value Date   CHOL 136 07/11/2015   TRIG 146.0 07/11/2015   HDL 35.50 (L) 07/11/2015   LDLCALC 71 07/11/2015    Lab Results  Component Value Date   HGBA1C 6.2 07/11/2015    ASSESSMENT AND PLAN  PERICARDITIS:   He is recovered from this but continues to complete a 3 month course of cultures seen. No change in therapy is indicated.  CAD:  The patient has no new sypmtoms.  No further cardiovascular testing is indicated.  We will continue with aggressive risk reduction and meds as listed. He had had no new symptoms since his cardiac cath.   HTN:  His blood pressure is well controlled. He will continue the meds as listed.  DYSLIPIDEMIA:  His lipid profile was excellent last year.  I will order a lipid profile and A1C.    DM:  Per Willow Ora, MD

## 2016-05-17 ENCOUNTER — Ambulatory Visit (INDEPENDENT_AMBULATORY_CARE_PROVIDER_SITE_OTHER): Payer: BC Managed Care – PPO | Admitting: Cardiology

## 2016-05-17 ENCOUNTER — Encounter: Payer: Self-pay | Admitting: Cardiology

## 2016-05-17 VITALS — BP 114/76 | HR 80 | Ht 72.0 in | Wt 227.0 lb

## 2016-05-17 DIAGNOSIS — I10 Essential (primary) hypertension: Secondary | ICD-10-CM | POA: Diagnosis not present

## 2016-05-17 DIAGNOSIS — I309 Acute pericarditis, unspecified: Secondary | ICD-10-CM

## 2016-05-17 DIAGNOSIS — E785 Hyperlipidemia, unspecified: Secondary | ICD-10-CM

## 2016-05-17 DIAGNOSIS — I2510 Atherosclerotic heart disease of native coronary artery without angina pectoris: Secondary | ICD-10-CM

## 2016-05-17 DIAGNOSIS — E119 Type 2 diabetes mellitus without complications: Secondary | ICD-10-CM | POA: Diagnosis not present

## 2016-05-17 NOTE — Patient Instructions (Addendum)
Medication Instructions:  Continue current medications  Labwork: Fasting Lipids and A1c   Testing/Procedures: None Ordered  Follow-Up: Your physician wants you to follow-up in: 1 Year. You will receive a reminder letter in the mail two months in advance. If you don't receive a letter, please call our office to schedule the follow-up appointment.   Any Other Special Instructions Will Be Listed Below (If Applicable).   If you need a refill on your cardiac medications before your next appointment, please call your pharmacy.

## 2016-05-20 ENCOUNTER — Encounter: Payer: BC Managed Care – PPO | Admitting: Internal Medicine

## 2016-05-23 ENCOUNTER — Telehealth: Payer: Self-pay | Admitting: Internal Medicine

## 2016-05-23 NOTE — Telephone Encounter (Signed)
Dr Antoine Poche order Lipid panel and A1C, patient is requesting to have lab work here. Please advise Please call cell first [phone] cell [phone] home, Patient states Dr Drue Novel has done this previously.

## 2016-05-23 NOTE — Telephone Encounter (Signed)
Okay to get labs here at the patient's convenience

## 2016-05-23 NOTE — Telephone Encounter (Signed)
Please advise 

## 2016-05-23 NOTE — Telephone Encounter (Signed)
Lab appt scheduled w/ Pt on 9/29 at 1030.

## 2016-05-24 ENCOUNTER — Other Ambulatory Visit (INDEPENDENT_AMBULATORY_CARE_PROVIDER_SITE_OTHER): Payer: BC Managed Care – PPO

## 2016-05-24 DIAGNOSIS — E119 Type 2 diabetes mellitus without complications: Secondary | ICD-10-CM

## 2016-05-24 LAB — LIPID PANEL
Cholesterol: 135 mg/dL (ref 0–200)
HDL: 35.4 mg/dL — ABNORMAL LOW
LDL Cholesterol: 77 mg/dL (ref 0–99)
NonHDL: 99.12
Total CHOL/HDL Ratio: 4
Triglycerides: 111 mg/dL (ref 0.0–149.0)
VLDL: 22.2 mg/dL (ref 0.0–40.0)

## 2016-05-24 LAB — HEMOGLOBIN A1C: Hgb A1c MFr Bld: 6.3 % (ref 4.6–6.5)

## 2016-06-07 ENCOUNTER — Ambulatory Visit: Payer: BC Managed Care – PPO | Admitting: Pulmonary Disease

## 2016-06-24 ENCOUNTER — Telehealth: Payer: Self-pay | Admitting: Internal Medicine

## 2016-06-24 DIAGNOSIS — M059 Rheumatoid arthritis with rheumatoid factor, unspecified: Secondary | ICD-10-CM

## 2016-06-24 NOTE — Telephone Encounter (Signed)
Patient's wife called and stated that patient would like a written copy of the lab results from 05/24/16. Also, patient's wife would like to know if she can bring the lab orders from the Rheumatologist to our office and have Dr. Drue Novel re-write them so they can be drawn here. Please advise.   Caller: Britta Mccreedy Patient relation: wife Phone: [phone]

## 2016-06-24 NOTE — Telephone Encounter (Signed)
Please advise 

## 2016-06-24 NOTE — Telephone Encounter (Signed)
Spoke w/ Britta Mccreedy, lab appt scheduled and labs ordered. Labs printed from 05/24/2016 and placed at front desk.

## 2016-06-24 NOTE — Telephone Encounter (Signed)
Okay to provide a copy, okay to bring the orders from his rheumatologist.

## 2016-06-25 ENCOUNTER — Other Ambulatory Visit (INDEPENDENT_AMBULATORY_CARE_PROVIDER_SITE_OTHER): Payer: BC Managed Care – PPO

## 2016-06-25 ENCOUNTER — Telehealth: Payer: Self-pay | Admitting: Internal Medicine

## 2016-06-25 DIAGNOSIS — M059 Rheumatoid arthritis with rheumatoid factor, unspecified: Secondary | ICD-10-CM

## 2016-06-25 NOTE — Telephone Encounter (Signed)
Lab results from 09/11/2015, 01/18/2016, and 05/24/2016 printed and mailed to Pt. Note placed on labs that I can not provided results from other MD offices and that labs drawn today, 06/25/2016, have not been resulted yet and will not appear with results.

## 2016-06-25 NOTE — Telephone Encounter (Signed)
Patient is requesting all of his lab results for the past year sent to his address on file. They do not use Mychart. Please advise.   Patient phone:[phone]  Patient address: [address]  [place] [address]

## 2016-06-26 LAB — CBC WITH DIFFERENTIAL/PLATELET
Basophils Absolute: 0 10*3/uL (ref 0.0–0.1)
Basophils Relative: 0.4 % (ref 0.0–3.0)
EOS PCT: 0.8 % (ref 0.0–5.0)
Eosinophils Absolute: 0 10*3/uL (ref 0.0–0.7)
HCT: 44.8 % (ref 39.0–52.0)
Hemoglobin: 15.1 g/dL (ref 13.0–17.0)
LYMPHS ABS: 2.5 10*3/uL (ref 0.7–4.0)
Lymphocytes Relative: 50.8 % — ABNORMAL HIGH (ref 12.0–46.0)
MCHC: 33.7 g/dL (ref 30.0–36.0)
MCV: 92.7 fl (ref 78.0–100.0)
MONO ABS: 0.9 10*3/uL (ref 0.1–1.0)
Monocytes Relative: 18.2 % — ABNORMAL HIGH (ref 3.0–12.0)
NEUTROS PCT: 29.8 % — AB (ref 43.0–77.0)
Neutro Abs: 1.5 10*3/uL (ref 1.4–7.7)
PLATELETS: 175 10*3/uL (ref 150.0–400.0)
RBC: 4.83 Mil/uL (ref 4.22–5.81)
RDW: 15.6 % — AB (ref 11.5–15.5)
WBC: 5 10*3/uL (ref 4.0–10.5)

## 2016-06-26 LAB — COMPREHENSIVE METABOLIC PANEL
ALBUMIN: 4.1 g/dL (ref 3.5–5.2)
ALT: 34 U/L (ref 0–53)
AST: 22 U/L (ref 0–37)
Alkaline Phosphatase: 84 U/L (ref 39–117)
BILIRUBIN TOTAL: 0.7 mg/dL (ref 0.2–1.2)
BUN: 11 mg/dL (ref 6–23)
CALCIUM: 9.4 mg/dL (ref 8.4–10.5)
CHLORIDE: 103 meq/L (ref 96–112)
CO2: 28 mEq/L (ref 19–32)
CREATININE: 0.63 mg/dL (ref 0.40–1.50)
GFR: 136.1 mL/min (ref >60.00)
Glucose, Bld: 167 mg/dL — ABNORMAL HIGH (ref 70–99)
Potassium: 4.3 mEq/L (ref 3.5–5.1)
Sodium: 139 mEq/L (ref 135–145)
Total Protein: 7.2 g/dL (ref 6.0–8.3)

## 2016-06-26 LAB — AST: AST: 22 U/L (ref 0–37)

## 2016-06-26 LAB — ALT: ALT: 34 U/L (ref 0–53)

## 2016-06-28 ENCOUNTER — Telehealth: Payer: Self-pay | Admitting: Internal Medicine

## 2016-06-28 NOTE — Telephone Encounter (Signed)
Forms that were faxed to [facility] were faxed to the wrong department. The correct fax number is [phone]

## 2016-06-28 NOTE — Telephone Encounter (Signed)
Labs refaxed to [phone].

## 2016-07-01 ENCOUNTER — Other Ambulatory Visit: Payer: Self-pay | Admitting: Internal Medicine

## 2016-07-23 ENCOUNTER — Other Ambulatory Visit: Payer: Self-pay | Admitting: Cardiology

## 2016-07-24 ENCOUNTER — Ambulatory Visit (INDEPENDENT_AMBULATORY_CARE_PROVIDER_SITE_OTHER): Payer: BC Managed Care – PPO | Admitting: Pulmonary Disease

## 2016-07-24 ENCOUNTER — Encounter: Payer: Self-pay | Admitting: Pulmonary Disease

## 2016-07-24 VITALS — BP 142/82 | HR 88 | Ht 72.0 in | Wt 228.8 lb

## 2016-07-24 DIAGNOSIS — J439 Emphysema, unspecified: Secondary | ICD-10-CM | POA: Diagnosis not present

## 2016-07-24 DIAGNOSIS — J479 Bronchiectasis, uncomplicated: Secondary | ICD-10-CM | POA: Diagnosis not present

## 2016-07-24 DIAGNOSIS — J9611 Chronic respiratory failure with hypoxia: Secondary | ICD-10-CM

## 2016-07-24 NOTE — Patient Instructions (Signed)
Follow up in 6 months 

## 2016-07-24 NOTE — Progress Notes (Signed)
Current Outpatient Prescriptions on File Prior to Visit  Medication Sig  . albuterol (PROVENTIL HFA;VENTOLIN HFA) 108 (90 BASE) MCG/ACT inhaler Inhale 2 puffs into the lungs every 4 (four) hours as needed for wheezing or shortness of breath.  Marland Kitchen amitriptyline (ELAVIL) 75 MG tablet Take 2 tablets at bedtime  . Ascorbic Acid (VITAMIN C) 500 MG tablet Take 500 mg by mouth daily.    Marland Kitchen aspirin 81 MG tablet Take 81 mg by mouth daily.  Marland Kitchen atorvastatin (LIPITOR) 80 MG tablet TAKE 1 TABLET(80 MG) BY MOUTH DAILY  . B-D TB SYRINGE 1CC/27GX1/2" 27G X 1/2" 1 ML MISC Reported on 02/16/2016  . Carbonyl Iron (PERFECT IRON PO) Take 1 tablet by mouth every morning.   . clobetasol cream (TEMOVATE) 0.05 % Apply 1 application topically 2 (two) times daily.  . Fluticasone-Salmeterol (ADVAIR DISKUS) 100-50 MCG/DOSE AEPB Inhale 1 puff into the lungs 2 (two) times daily.  . folic acid (FOLVITE) 800 MCG tablet Take 800 mcg by mouth daily.  . furosemide (LASIX) 20 MG tablet Take 2 tablets in the morning and 1 tablets at 4 pm  . Lancets (ONETOUCH ULTRASOFT) lancets USE TO TEST BLOOD SUGAR NO MORE THAN TWICE DAILY  . Loteprednol Etabonate (LOTEMAX) 0.5 % GEL Place 1 drop into both eyes every morning. daily  . Methotrexate Sodium, PF, 50 MG/2ML SOLN Inject 25 mg into the skin once a week.   . metoprolol tartrate (LOPRESSOR) 25 MG tablet Take 1 tablet (25 mg total) by mouth 2 (two) times daily.  . Misc. Devices (ACAPELLA) MISC Use as directed  . nitroGLYCERIN (NITROSTAT) 0.4 MG SL tablet Place 1 tablet (0.4 mg total) under the tongue every 5 (five) minutes as needed for chest pain.  . NON FORMULARY EVERGO PORTABLE PULSE DOSE OXYGEN oxygen 3L per nasal cannula 24 hours a day Dx: 496  . ONE TOUCH ULTRA TEST test strip USE TO TEST BLOOD SUGAR NO MORE THAN TWICE DAILY  . oxyCODONE-acetaminophen (PERCOCET) 7.5-325 MG per tablet Take 1 tablet by mouth 3 (three) times daily. 3 times a day  . pantoprazole (PROTONIX) 40 MG tablet Take  1 tablet (40 mg total) by mouth 2 (two) times daily.  . potassium chloride (K-DUR,KLOR-CON) 10 MEQ tablet Take two (4) tablets (40 meq total) by mouth every morning and take one (2) tablet (20 meq total) by mouth every evening.  . predniSONE (DELTASONE) 5 MG tablet Take as directed  . Respiratory Therapy Supplies (FLUTTER) DEVI 1 Inhaler by Does not apply route 2 (two) times daily.  Marland Kitchen tiotropium (SPIRIVA HANDIHALER) 18 MCG inhalation capsule INHALE 1 CAPSULE BY MOUTH IN HANDIHALER DAILY  . vitamin B-12 (CYANOCOBALAMIN) 250 MCG tablet Take 250 mcg by mouth daily.    . [DISCONTINUED] omeprazole (PRILOSEC OTC) 20 MG tablet Take 1 tablet (20 mg total) by mouth daily.   No current facility-administered medications on file prior to visit.      Chief Complaint  Patient presents with  . Follow-up    Pt states that his breathing has been doing okay - pt is having some increased SOB with exertion. Pt is trying to stay active to build stamina and endurance but is effective often by the SOB which slows him down. Uses flutter valve for congestion and incentive spirometer     Pulmonary tests PFT 01/12/09 >> FEV1 3.02 (85%), FEV1% 68, TLC 6.35 (88%), DLCO 45%, no BD PFT 12/02/12 >> FEV1 3.11 (78%), FEV1% 77, TLC 6.32 (83%), DLCO 37%, no BD  CT  chests CT chest 10/17/12 >> severe centrilobular emphysema, apical bullae, 1 cm nodule LUL new, 5 mm nodule RLL stable since 2009, BTX with GGO at bases CT chest 05/06/13 >> decreased size LUL nodule now 8 mm, 4 mm nodule RLL, 3 mm nodule RML, 7 mm nodule RML, 8 mm nodule lingula CT chest 04/05/14 >> atherosclerosis, moderate diffuse centrilobular and paraseptal emphysema, stable pulmonary nodules with dominant LUL nodule now 7 mm, steatosis of liver with ?early cirrhosis CT chest 05/02/15 >> stable pulmonary nodules since 2009, severe emphysema CT angio chest 01/19/16 >> bullous emphysema CT angio chest 02/23/16 >> 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  Vital Signs BP (!) 142/82 (BP Location: Left Arm, Cuff Size: Normal)   Pulse 88   Ht 6' (1.829 m)   Wt 228 lb 12.8 oz (103.8 kg)   SpO2 98%   BMI 31.03 kg/m   History of Present Illness Joseph [name] is a 65 y.o. male former smoker with COPD/emphysema, rheumatoid arthritis, recurrent pneumonia 2nd to chronic aspiration/reflux, bronchiectasis, chronic respiratory failure on 3 liters oxygen 24/7.  He was having trouble with pleurisy over the Summer.  He was eventually found to have pneumonia.  He was treated with Abx, colchicine, and ibuprofen.  He has been doing better recently.  His wife is worried that he gets winded more easily.  He was not exercising while he was sick, but recently started going back to the [facility].    He has pulse ox meter and his oxygen never goes below 90%.  He is not having cough, wheeze, chest pain, fever, or hemoptysis.  Physical Exam  General - No distress, wearing oxygen ENT - No sinus tenderness, no oral exudate, no LAN Cardiac - s1s2 regular, no murmur Chest - no wheeze Back - No focal tenderness Abd - Soft, non-tender Ext - extensive changes of RA Neuro - Normal strength Skin - No rashes Psych - normal mood, and behavior   Assessment/Plan  COPD with bullous emphysema. - continue spiriva, advair, and prn albuterol  Bronchiectasis. - continue bronchial hygiene  Dyspnea on exertion. - likely related to deconditioning after recent illness - continue monitored exercise program  Recurrent aspiration PNA with hx of GERD, HH, and gastroparesis. - continue bid protonix per GI  Chronic respiratory failure. - Related to COPD/Emphysema. - advised  him to monitor his pulse ox reading at home, and call if he notices his level going below 90%  Hx of Rheumatoid arthritis. - he is on MTX, and chronic prednisone per rheumatology, Dr. Annita Brod at [facility]    Patient Instructions  Follow up in 6 months    [name], MD [place] [facility] Pager:  [phone] 07/24/2016, 1:07 PM

## 2016-07-31 ENCOUNTER — Other Ambulatory Visit: Payer: Self-pay | Admitting: Cardiology

## 2016-08-13 ENCOUNTER — Other Ambulatory Visit: Payer: Self-pay | Admitting: Pulmonary Disease

## 2016-08-20 ENCOUNTER — Ambulatory Visit (INDEPENDENT_AMBULATORY_CARE_PROVIDER_SITE_OTHER)
Admission: RE | Admit: 2016-08-20 | Discharge: 2016-08-20 | Disposition: A | Discharge status: Home / Self Care | Payer: BC Managed Care – PPO | Source: Ambulatory Visit | Attending: Internal Medicine | Admitting: Internal Medicine

## 2016-08-20 ENCOUNTER — Ambulatory Visit (INDEPENDENT_AMBULATORY_CARE_PROVIDER_SITE_OTHER): Payer: BC Managed Care – PPO | Admitting: Internal Medicine

## 2016-08-20 ENCOUNTER — Encounter: Payer: Self-pay | Admitting: Internal Medicine

## 2016-08-20 ENCOUNTER — Telehealth: Payer: Self-pay | Admitting: Pulmonary Disease

## 2016-08-20 VITALS — BP 134/70 | HR 105 | Temp 98.7°F | Ht 72.0 in | Wt 228.0 lb

## 2016-08-20 DIAGNOSIS — J471 Bronchiectasis with (acute) exacerbation: Secondary | ICD-10-CM

## 2016-08-20 DIAGNOSIS — J9611 Chronic respiratory failure with hypoxia: Secondary | ICD-10-CM | POA: Diagnosis not present

## 2016-08-20 MED ORDER — LEVOFLOXACIN 750 MG PO TABS: 750.0000 mg | ORAL_TABLET | Freq: Every day | ORAL | 0 refills | Status: DC

## 2016-08-20 NOTE — Telephone Encounter (Signed)
Spoke with pt. He has been scheduled with MW today at 3:15pm. Nothing further was needed.

## 2016-08-20 NOTE — Patient Instructions (Addendum)
Please remember to go to the x-ray department downstairs for your tests - we will call you with the results when they are available.  Bronchiectasis =   you have scarring of your bronchial tubes which means that they don't function perfectly normally and mucus tends to pool in certain areas of your lung which can cause pneumonia and further scarring of your lung and bronchial tubes  Whenever you develop cough congestion take mucinex or mucinex dm up to 1200 mg every 12 hours  > these will help keep the mucus loose and flowing but if your condition worsens you need to seek help immediately preferably here or somewhere inside the [facility] system to compare xrays ( worse = darker or bloody mucus or pain on breathing in)    supplment the mucinex dm with the percoet up to 2 every 4 hours (72hours)   Flutter valve as much as possible   Levaquin 750 mg daily x 5 days   Pantoprazole (protonix) 40 mg Take 30- 60 min before your first and last meals of the day  and Pepcid (famotidine)  20 mg one @  Bedtime(over the counter)  Just until cough is gone then go back to previous regimen  GERD (REFLUX)  is an extremely common cause of respiratory symptoms just like yours , many times with no obvious heartburn at all.    It can be treated with medication, but also with lifestyle changes including elevation of the head of your bed (ideally with 6 inch  bed blocks),  Smoking cessation, avoidance of late meals, excessive alcohol, and avoid fatty foods, chocolate, peppermint, colas, red wine, and acidic juices such as orange juice.  NO MINT OR MENTHOL PRODUCTS SO NO COUGH DROPS  USE SUGARLESS CANDY INSTEAD (Jolley ranchers or Stover's or Life Savers) or even ice chips will also do - the key is to swallow to prevent all throat clearing. NO OIL BASED VITAMINS - use powdered substitutes.

## 2016-08-20 NOTE — Progress Notes (Signed)
Pulmonary tests PFT 01/12/09 >> FEV1 3.02 (85%), FEV1% 68, TLC 6.35 (88%), DLCO 45%, no BD PFT 12/02/12 >> FEV1 3.11 (78%), FEV1% 77, TLC 6.32 (83%), DLCO 37%, no BD  CT chests CT chest 10/17/12 >> severe centrilobular emphysema, apical bullae, 1 cm nodule LUL new, 5 mm nodule RLL stable since 2009, BTX with GGO at bases CT chest 05/06/13 >> decreased size LUL nodule now 8 mm, 4 mm nodule RLL, 3 mm nodule RML, 7 mm nodule RML, 8 mm nodule lingula CT chest 04/05/14 >> atherosclerosis, moderate diffuse centrilobular and paraseptal emphysema, stable pulmonary nodules with dominant LUL nodule now 7 mm, steatosis of liver with ?early cirrhosis CT chest 05/02/15 >> stable pulmonary nodules since 2009, severe emphysema CT angio chest 01/19/16 >> bullous emphysema CT angio chest 02/23/16 >> 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 male former smoker with COPD/emphysema, rheumatoid arthritis, recurrent pneumonia 2nd to chronic aspiration/reflux, bronchiectasis, chronic respiratory failure on 3 liters oxygen 24/7. He was having trouble with pleurisy over the Summer of 2017   He was eventually found to have pneumonia.  He was treated with Abx, colchicine, and ibuprofen.  He has been doing better recently. His wife is worried that he gets winded more easily.  He was not exercising while he was sick, but recently started going back to the [facility].   He has pulse ox meter and his oxygen never goes below 90%. He is not having cough, wheeze, chest pain, fever, or hemoptysis. rec No change rx    08/20/2016 acute extended ov/Joseph [name] re: cough  / bronchiectasis with min airflow obst maint rx spiriva/advair  dpi  Chief Complaint  Patient presents with  . Acute Visit    pt c/o prod cough with clear to green mucus, post nasal drip,  low grade temp 99.7 & chest pain from coughing spell X 2d   acutely worse since 08/18/16 with severe acute cough and ant midline cp just  with coughing fits  Producing esp in am > Green mucus/ has fltter valve not using / some better with saba/ no sob at rest, just exertion  No obvious day to day or daytime variability or assoc   mucus plugs or hemoptysis or cp or chest tightness, subjective wheeze or overt sinus or hb symptoms. No unusual exp hx or h/o childhood pna/ asthma or knowledge of premature birth.  Sleeping ok without nocturnal  or early am exacerbation  of respiratory  c/o's or need for noct saba. Also denies any obvious fluctuation of symptoms with weather or environmental changes or other aggravating or alleviating factors except as outlined above   Current Medications, Allergies, Complete Past Medical History, Past Surgical History, Family History, and Social History were reviewed in [employer] record.  ROS  The following are not active complaints unless bolded sore throat, dysphagia, dental problems, itching, sneezing,  nasal congestion or excess/ purulent secretions, ear ache,   fever, chills, sweats, unintended wt loss, classically pleuritic or exertional cp,  orthopnea pnd or leg swelling, presyncope, palpitations, abdominal pain, anorexia, nausea, vomiting, diarrhea  or change in bowel or bladder habits, change in stools  or urine, dysuria,hematuria,  rash, arthralgias, visual complaints, headache, numbness, weakness or ataxia or problems with walking or coordination,  change in mood/affect or memory.           Physical Exam  amb wm nad  - Note on arrival 02 sats  92% on 3.5 lpm     Wt Readings from Last 3 Encounters:  08/20/16 228 lb (103.4 kg)  07/24/16 228 lb 12.8 oz (103.8 kg)  05/17/16 227 lb (103 kg)    Vital signs  reviewed   General - No distress, wearing oxygen ENT - No sinus tenderness, no oral exudate, no LAN/ edentulous  Cardiac - s1s2 regular, no murmur Chest - no wheeze/ congested rattling coarse sounding cough on fvc / no increased wob Back - No focal tenderness Abd - Soft, non-tender Ext - extensive changes of RA Neuro - Normal strength Skin - No rashes Psych - normal mood, and behavior  CXR PA and Lateral:   08/20/2016 :    I personally reviewed images and agree with radiology impression as follows:   Chronic changes bilaterally without acute abnormality.   Assessment/Plan

## 2016-08-21 ENCOUNTER — Telehealth: Payer: Self-pay | Admitting: Pulmonary Disease

## 2016-08-21 NOTE — Telephone Encounter (Signed)
Notes Recorded by Nyoka Cowden, MD on 08/21/2016 at 4:29 AM EST Call pt: Reviewed cxr and no acute change so no change in recommendations made at ov  Pt aware of results and voiced his understanding. Nothing further needed.

## 2016-08-23 ENCOUNTER — Telehealth: Payer: Self-pay | Admitting: Internal Medicine

## 2016-08-23 ENCOUNTER — Telehealth: Payer: Self-pay | Admitting: Pulmonary Disease

## 2016-08-23 ENCOUNTER — Other Ambulatory Visit: Payer: BC Managed Care – PPO

## 2016-08-23 DIAGNOSIS — J471 Bronchiectasis with (acute) exacerbation: Secondary | ICD-10-CM

## 2016-08-23 MED ORDER — LEVOFLOXACIN 750 MG PO TABS: 750.0000 mg | ORAL_TABLET | Freq: Every day | ORAL | 0 refills | Status: DC

## 2016-08-23 NOTE — Telephone Encounter (Signed)
Pt aware of recs.  levaquin and sputum culture ordered.  Nothing further needed.

## 2016-08-23 NOTE — Telephone Encounter (Signed)
Spoke with pt, wanted to make [place] aware that sputum culture would take several days to process.  I advised pt that this is ok and he is still to take levaquin as prescribed (see previous phone message).  Nothing further needed.

## 2016-08-23 NOTE — Telephone Encounter (Signed)
   Pls call in 7 more days of Levaquin 750 mg dose.  Pls order a sputum culture if pt is able to submit a sample.  Thanks. Let pt go to ED if not better over the weekend.  Pollie Meyer, MD 08/23/2016, 2:02 PM [facility] Pager [phone] After 3 pm or if no answer, call [phone]

## 2016-08-23 NOTE — Telephone Encounter (Signed)
Spoke with pt, states he will take his last dose of levaquin tomorrow- cough is unimproved, prod with clear to green mucus, sob.  Pt was put on 5 days of Levaquin 750mg  for MW on 12/26.  Pt states symptoms have not worsened, but they also are no better.  Pt requesting further recs as we go into a long holiday weekend.   Pt uses [facility] in [place].    Sending to DOD as MW is unavailable today.  AD please advise.  Thanks!

## 2016-08-24 LAB — RESPIRATORY CULTURE OR RESPIRATORY AND SPUTUM CULTURE

## 2016-08-26 NOTE — Assessment & Plan Note (Addendum)
DDX of  difficult airways management almost all start with A and  include Adherence, Ace Inhibitors, Acid Reflux, Active Sinus Disease, Alpha 1 Antitripsin deficiency, Anxiety masquerading as Airways dz,  ABPA,  Allergy(esp in young), Aspiration (esp in elderly), Adverse effects of meds,  Active smokers, A bunch of PE's (a small clot burden can't cause this syndrome unless there is already severe underlying pulm or vascular dz with poor reserve) plus two Bs  = Bronchiectasis and Beta blocker use..and one C= CHF  Adherence is always the initial "prime suspect" and is a multilayered concern that requires a "trust but verify" approach in every patient - starting with knowing how to use medications, especially inhalers, correctly, keeping up with refills and understanding the fundamental difference between maintenance and prns vs those medications only taken for a very short course and then stopped and not refilled.   ? Acid (or non-acid) GERD > always difficult to exclude as up to 75% of pts in some series report no assoc GI/ Heartburn symptoms> rec max (24h)  acid suppression and diet restrictions/ reviewed and instructions given in writing.   ? Adverse effects from dpi's > he has done ok on them chronically so no changes needed   ? Active sinus dz >  Consider sinus ct in f/u if not improved p levaquin  ? Allergy/ asthma/ copd > really not impressed with pfts or exam and doubt he has much if any refractory airflow obstruction here though clearly suffers from poor mucociliary fxn so may be benefiting from rx as a copd pt > no changes rec  Bronchiectasis > discussed pathophysiology and strategy to control symptoms acutely and chronically, really needs to remember his flutter valve   ? chf > note echo 01/21/16 with diastolic dysfunction but only Grade 1 and no evidence chf or cardiac asthma here    Assoc with midline cp from coughing suggesting mscp or gerd from coughing or both > rec levaquin/ flutter  valve and max rx for gerd with acid suppression/diet > see avs for instructions unique to this ov  I had an extended discussion with the patient reviewing all relevant studies completed to date and  lasting 25 minutes of a 40  minute acute office visit with pt not previously known to me  re  non-specific but potentially very serious pulmonary symptoms of unknown etiology.  Each maintenance medication was reviewed in detail including most importantly the difference between maintenance and prns and under what circumstances the prns are to be triggered using an action plan format that is not reflected in the computer generated alphabetically organized AVS.    Please see AVS for unique instructions that I personally wrote and verbalized to the the pt in detail and then reviewed with pt  by my nurse highlighting any  changes in therapy recommended at today's visit to their plan of care.

## 2016-10-02 ENCOUNTER — Encounter (HOSPITAL_COMMUNITY): Payer: Self-pay | Admitting: *Deleted

## 2016-10-02 ENCOUNTER — Other Ambulatory Visit (HOSPITAL_COMMUNITY): Payer: Self-pay

## 2016-10-02 ENCOUNTER — Telehealth: Payer: Self-pay | Admitting: Cardiology

## 2016-10-02 ENCOUNTER — Other Ambulatory Visit: Payer: Self-pay

## 2016-10-02 ENCOUNTER — Observation Stay (HOSPITAL_COMMUNITY)
Admission: EM | Admit: 2016-10-02 | Discharge: 2016-10-03 | Disposition: A | Discharge status: Home / Self Care | Payer: BC Managed Care – PPO | Attending: Cardiology | Admitting: Cardiology

## 2016-10-02 ENCOUNTER — Emergency Department (HOSPITAL_COMMUNITY): Payer: BC Managed Care – PPO

## 2016-10-02 DIAGNOSIS — K219 Gastro-esophageal reflux disease without esophagitis: Secondary | ICD-10-CM | POA: Diagnosis not present

## 2016-10-02 DIAGNOSIS — I483 Typical atrial flutter: Secondary | ICD-10-CM | POA: Diagnosis not present

## 2016-10-02 DIAGNOSIS — J449 Chronic obstructive pulmonary disease, unspecified: Secondary | ICD-10-CM | POA: Insufficient documentation

## 2016-10-02 DIAGNOSIS — Z7982 Long term (current) use of aspirin: Secondary | ICD-10-CM | POA: Insufficient documentation

## 2016-10-02 DIAGNOSIS — E1143 Type 2 diabetes mellitus with diabetic autonomic (poly)neuropathy: Secondary | ICD-10-CM | POA: Diagnosis not present

## 2016-10-02 DIAGNOSIS — M810 Age-related osteoporosis without current pathological fracture: Secondary | ICD-10-CM | POA: Diagnosis not present

## 2016-10-02 DIAGNOSIS — Z87891 Personal history of nicotine dependence: Secondary | ICD-10-CM | POA: Diagnosis not present

## 2016-10-02 DIAGNOSIS — Z7952 Long term (current) use of systemic steroids: Secondary | ICD-10-CM | POA: Insufficient documentation

## 2016-10-02 DIAGNOSIS — I2510 Atherosclerotic heart disease of native coronary artery without angina pectoris: Secondary | ICD-10-CM | POA: Insufficient documentation

## 2016-10-02 DIAGNOSIS — I10 Essential (primary) hypertension: Secondary | ICD-10-CM

## 2016-10-02 DIAGNOSIS — I110 Hypertensive heart disease with heart failure: Secondary | ICD-10-CM | POA: Diagnosis not present

## 2016-10-02 DIAGNOSIS — Z79899 Other long term (current) drug therapy: Secondary | ICD-10-CM | POA: Diagnosis not present

## 2016-10-02 DIAGNOSIS — K760 Fatty (change of) liver, not elsewhere classified: Secondary | ICD-10-CM | POA: Diagnosis not present

## 2016-10-02 DIAGNOSIS — I4892 Unspecified atrial flutter: Secondary | ICD-10-CM | POA: Diagnosis present

## 2016-10-02 DIAGNOSIS — I252 Old myocardial infarction: Secondary | ICD-10-CM | POA: Diagnosis not present

## 2016-10-02 DIAGNOSIS — Z9981 Dependence on supplemental oxygen: Secondary | ICD-10-CM | POA: Diagnosis not present

## 2016-10-02 DIAGNOSIS — Z79891 Long term (current) use of opiate analgesic: Secondary | ICD-10-CM | POA: Insufficient documentation

## 2016-10-02 DIAGNOSIS — I5032 Chronic diastolic (congestive) heart failure: Secondary | ICD-10-CM | POA: Insufficient documentation

## 2016-10-02 LAB — CBC
HEMATOCRIT: 42.1 % (ref 39.0–52.0)
HEMOGLOBIN: 14.1 g/dL (ref 13.0–17.0)
MCH: 30.3 pg (ref 26.0–34.0)
MCHC: 33.5 g/dL (ref 30.0–36.0)
MCV: 90.3 fL (ref 78.0–100.0)
Platelets: 133 10*3/uL — ABNORMAL LOW (ref 150–400)
RBC: 4.66 MIL/uL (ref 4.22–5.81)
RDW: 15.6 % — ABNORMAL HIGH (ref 11.5–15.5)
WBC: 5.1 10*3/uL (ref 4.0–10.5)

## 2016-10-02 LAB — BASIC METABOLIC PANEL
ANION GAP: 10 (ref 5–15)
BUN: 10 mg/dL (ref 6–20)
CHLORIDE: 105 mmol/L (ref 101–111)
CO2: 24 mmol/L (ref 22–32)
Calcium: 9.7 mg/dL (ref 8.9–10.3)
Creatinine, Ser: 0.73 mg/dL (ref 0.61–1.24)
GFR calc Af Amer: >60 mL/min (ref >60)
Glucose, Bld: 212 mg/dL — ABNORMAL HIGH (ref 65–99)
POTASSIUM: 3.7 mmol/L (ref 3.5–5.1)
SODIUM: 139 mmol/L (ref 135–145)

## 2016-10-02 LAB — TSH: TSH: 1.031 u[IU]/mL (ref 0.350–4.500)

## 2016-10-02 LAB — I-STAT TROPONIN, ED: Troponin i, poc: 0 ng/mL (ref 0.00–0.08)

## 2016-10-02 MED ORDER — POTASSIUM CHLORIDE CRYS ER 20 MEQ PO TBCR
40.0000 meq | EXTENDED_RELEASE_TABLET | Freq: Every day | ORAL | Status: DC
  Administered 2016-10-03: 40 meq via ORAL
  Filled 2016-10-02: qty 2

## 2016-10-02 MED ORDER — VITAMIN B-12 1000 MCG PO TABS
1000.0000 ug | ORAL_TABLET | Freq: Every day | ORAL | Status: DC
  Administered 2016-10-03: 1000 ug via ORAL
  Filled 2016-10-02: qty 1

## 2016-10-02 MED ORDER — PANTOPRAZOLE SODIUM 40 MG PO TBEC
40.0000 mg | DELAYED_RELEASE_TABLET | Freq: Two times a day (BID) | ORAL | Status: DC
  Administered 2016-10-02 – 2016-10-03 (×2): 40 mg via ORAL
  Filled 2016-10-02 (×2): qty 1

## 2016-10-02 MED ORDER — POTASSIUM CHLORIDE CRYS ER 20 MEQ PO TBCR
20.0000 meq | EXTENDED_RELEASE_TABLET | Freq: Every day | ORAL | Status: DC
  Administered 2016-10-03: 20 meq via ORAL
  Filled 2016-10-02: qty 1

## 2016-10-02 MED ORDER — ALBUTEROL SULFATE HFA 108 (90 BASE) MCG/ACT IN AERS: 2.0000 | INHALATION_SPRAY | RESPIRATORY_TRACT | Status: DC | PRN

## 2016-10-02 MED ORDER — HEPARIN (PORCINE) IN NACL 100-0.45 UNIT/ML-% IJ SOLN
1300.0000 [IU]/h | INTRAMUSCULAR | Status: DC
  Administered 2016-10-02: 1300 [IU]/h via INTRAVENOUS
  Filled 2016-10-02: qty 250

## 2016-10-02 MED ORDER — METHOTREXATE SODIUM CHEMO INJECTION (PF) 50 MG/2ML
25.0000 mg | INTRAMUSCULAR | Status: DC
  Filled 2016-10-02: qty 1

## 2016-10-02 MED ORDER — METOPROLOL TARTRATE 25 MG PO TABS
25.0000 mg | ORAL_TABLET | Freq: Two times a day (BID) | ORAL | Status: DC
  Administered 2016-10-02 – 2016-10-03 (×2): 25 mg via ORAL
  Filled 2016-10-02 (×2): qty 1

## 2016-10-02 MED ORDER — PREDNISONE 5 MG PO TABS
5.0000 mg | ORAL_TABLET | Freq: Every day | ORAL | Status: DC
  Administered 2016-10-03: 5 mg via ORAL
  Filled 2016-10-02: qty 1

## 2016-10-02 MED ORDER — FUROSEMIDE 40 MG PO TABS
40.0000 mg | ORAL_TABLET | Freq: Every day | ORAL | Status: DC
  Administered 2016-10-03: 40 mg via ORAL
  Filled 2016-10-02: qty 1

## 2016-10-02 MED ORDER — FUROSEMIDE 20 MG PO TABS
20.0000 mg | ORAL_TABLET | Freq: Every day | ORAL | Status: DC
  Administered 2016-10-03: 20 mg via ORAL
  Filled 2016-10-02: qty 1

## 2016-10-02 MED ORDER — SODIUM CHLORIDE 0.9 % IV BOLUS (SEPSIS)
1000.0000 mL | Freq: Once | INTRAVENOUS | Status: AC
  Administered 2016-10-02: 1000 mL via INTRAVENOUS

## 2016-10-02 MED ORDER — HEPARIN BOLUS VIA INFUSION
4000.0000 [IU] | Freq: Once | INTRAVENOUS | Status: AC
Start: 2016-10-02 — End: 2016-10-02
  Administered 2016-10-02: 4000 [IU] via INTRAVENOUS
  Filled 2016-10-02: qty 4000

## 2016-10-02 MED ORDER — DILTIAZEM HCL 100 MG IV SOLR
5.0000 mg/h | INTRAVENOUS | Status: DC
  Administered 2016-10-02: 5 mg/h via INTRAVENOUS
  Administered 2016-10-03 (×2): 10 mg/h via INTRAVENOUS
  Filled 2016-10-02 (×4): qty 100

## 2016-10-02 MED ORDER — DILTIAZEM LOAD VIA INFUSION
10.0000 mg | Freq: Once | INTRAVENOUS | Status: AC
  Administered 2016-10-02: 10 mg via INTRAVENOUS
  Filled 2016-10-02: qty 10

## 2016-10-02 MED ORDER — DILTIAZEM HCL 25 MG/5ML IV SOLN: 10.0000 mg | Freq: Once | INTRAVENOUS | Status: DC

## 2016-10-02 MED ORDER — TIOTROPIUM BROMIDE MONOHYDRATE 18 MCG IN CAPS
18.0000 ug | ORAL_CAPSULE | Freq: Every day | RESPIRATORY_TRACT | Status: DC
Start: 2016-10-03 — End: 2016-10-03
  Administered 2016-10-03: 18 ug via RESPIRATORY_TRACT
  Filled 2016-10-02: qty 5

## 2016-10-02 MED ORDER — AMITRIPTYLINE HCL 50 MG PO TABS
150.0000 mg | ORAL_TABLET | Freq: Every day | ORAL | Status: DC
  Administered 2016-10-02: 150 mg via ORAL
  Filled 2016-10-02: qty 6

## 2016-10-02 MED ORDER — ALBUTEROL SULFATE (2.5 MG/3ML) 0.083% IN NEBU: 2.5000 mg | INHALATION_SOLUTION | RESPIRATORY_TRACT | Status: DC | PRN

## 2016-10-02 MED ORDER — ACETAMINOPHEN 325 MG PO TABS: 650.0000 mg | ORAL_TABLET | ORAL | Status: DC | PRN

## 2016-10-02 MED ORDER — OXYCODONE-ACETAMINOPHEN 7.5-325 MG PO TABS
1.0000 | ORAL_TABLET | Freq: Three times a day (TID) | ORAL | Status: DC
  Administered 2016-10-02 – 2016-10-03 (×3): 1 via ORAL
  Filled 2016-10-02 (×3): qty 1

## 2016-10-02 MED ORDER — MOMETASONE FURO-FORMOTEROL FUM 100-5 MCG/ACT IN AERO
2.0000 | INHALATION_SPRAY | Freq: Two times a day (BID) | RESPIRATORY_TRACT | Status: DC
  Administered 2016-10-03: 2 via RESPIRATORY_TRACT
  Filled 2016-10-02: qty 8.8

## 2016-10-02 MED ORDER — ATORVASTATIN CALCIUM 80 MG PO TABS: 80.0000 mg | ORAL_TABLET | Freq: Every day | ORAL | Status: DC

## 2016-10-02 MED ORDER — NITROGLYCERIN 0.4 MG SL SUBL: 0.4000 mg | SUBLINGUAL_TABLET | SUBLINGUAL | Status: DC | PRN

## 2016-10-02 MED ORDER — VITAMIN C 500 MG PO TABS
500.0000 mg | ORAL_TABLET | Freq: Every day | ORAL | Status: DC
  Administered 2016-10-03: 500 mg via ORAL
  Filled 2016-10-02: qty 1

## 2016-10-02 MED ORDER — ONDANSETRON HCL 4 MG/2ML IJ SOLN: 4.0000 mg | Freq: Four times a day (QID) | INTRAMUSCULAR | Status: DC | PRN

## 2016-10-02 MED ORDER — FOLIC ACID 1 MG PO TABS
1.0000 mg | ORAL_TABLET | Freq: Every day | ORAL | Status: DC
  Administered 2016-10-03: 1 mg via ORAL
  Filled 2016-10-02: qty 1

## 2016-10-02 MED ORDER — ASPIRIN 81 MG PO CHEW
81.0000 mg | CHEWABLE_TABLET | Freq: Every day | ORAL | Status: DC
  Administered 2016-10-03: 81 mg via ORAL
  Filled 2016-10-02: qty 1

## 2016-10-02 MED ORDER — APIXABAN 5 MG PO TABS
5.0000 mg | ORAL_TABLET | Freq: Two times a day (BID) | ORAL | Status: DC
  Administered 2016-10-02 – 2016-10-03 (×2): 5 mg via ORAL
  Filled 2016-10-02 (×2): qty 1

## 2016-10-02 NOTE — ED Triage Notes (Signed)
Pt reports being at his [facility] today. Pt states that they noticed his heart racing. Pt denies any symptoms. Pt is on home O2 at 3L for COPD

## 2016-10-02 NOTE — Progress Notes (Signed)
ANTICOAGULATION CONSULT NOTE - Follow Up Consult  Pharmacy Consult for Eliquis Indication: atrial fibrillation  No Known Allergies  Patient Measurements:  TBW 104 kg  Vital Signs: Temp: 98.2 F (36.8 C) (02/07 1416) Temp Source: Oral (02/07 1416) BP: 107/74 (02/07 2030) Pulse Rate: 134 (02/07 2030)  Labs:  Recent Labs  10/02/16 1426  HGB 14.1  HCT 42.1  PLT 133*  CREATININE 0.73    CrCl cannot be calculated (Unknown ideal weight.).   Assessment: 41 yoM presents with new onset AFib w/ RVR. Pt is on no oral anticoagulation PTA. Started on heparin gtt at first but now to transition to Eliquis. Age, weight, and SCr ok for 5mg  PO BID dosing.  Goal of Therapy:  Monitor platelets by anticoagulation protocol: Yes   Plan:  Stop heparin gtt when first dose of Eliquis given Start Eliquis 5mg  PO BID Monitor CBC, s/s of bleed  , PharmD, [facility] Clinical Pharmacist Pager [phone] 10/02/2016 9:29 PM

## 2016-10-02 NOTE — Progress Notes (Signed)
ANTICOAGULATION CONSULT NOTE - Initial Consult  Pharmacy Consult for heparin Indication: atrial fibrillation  No Known Allergies  Patient Measurements:   Heparin Dosing Weight: 99 kg  Vital Signs: Temp: 98.2 F (36.8 C) (02/07 1416) Temp Source: Oral (02/07 1416) BP: 125/93 (02/07 1545) Pulse Rate: 134 (02/07 1545)  Labs:  Recent Labs  10/02/16 1426  HGB 14.1  HCT 42.1  PLT 133*  CREATININE 0.73    CrCl cannot be calculated (Unknown ideal weight.).   Medical History: Past Medical History:  Diagnosis Date  . CAD (coronary artery disease)    MI 2007, CABG  . Cataracts, bilateral   . Complication of anesthesia    DIFFICULTY AFTER CABG HAD CONFUSION POST OP  . COPD (chronic obstructive pulmonary disease) (HCC)   . Diabetes mellitus    Dx 07-2008 A1C 6.2  . Esophageal dysmotility   . Fatty liver 10/11/09  . Gastroesophageal reflux disease   . Gastroparesis   . Hiatal hernia   . Hypertension   . Neuropathy (HCC)    (related to RA per neuro note 05-2011)  . Onychomycosis    toenails  . Osteoporosis    per DEXA 05-2008  . Oxygen dependent    3l  . Peripheral polyneuropathy (HCC)    Severe, causing problems with his feet, see surgeries  . Psoriasis   . Rheumatoid arthritis (HCC)    [facility] Rheumatology    Assessment: 78 yoM presents with new onset AFib w/ RVR. Pt is on no oral anticoagulation PTA to start on heparin gtt per pharmacy. Hgb wnl, plt slightly low.   Goal of Therapy:  Heparin level 0.3-0.7 units/ml Monitor platelets by anticoagulation protocol: Yes   Plan:  -Heparin 4000 units x1 -Heparin 1300 units/hr -F/U 6-hr heparin level -Monitor daily heparin level, CBC, S/Sx bleeding -F/U long-term OAC plans  Fredonia Highland, PharmD PGY-1 Pharmacy Resident Pager: [phone] 10/02/2016

## 2016-10-02 NOTE — ED Notes (Signed)
Notified secretary to order heart healthy dinner tray.

## 2016-10-02 NOTE — Telephone Encounter (Signed)
Patient is calling states that BP reading was 136/83 and the second reading was 125/88. Patient also states that heart rate 134. Patient is worried and would like some advice on whether or not they need to go to ER. Please call, thanks.

## 2016-10-02 NOTE — ED Notes (Signed)
Pt st's he was sent here from MD's office ref. Tachycardia.  Pt denies any pain or other complaints.  St's he feels fine.  Wife at bedside

## 2016-10-02 NOTE — H&P (Signed)
Patient ID: Joseph [name] MRN: [id number], DOB/AGE: 65/09/1950   Admit date: 10/02/2016   Primary Physician: Willow Ora, MD Primary Cardiologist: Dr. Antoine Poche  Pt. Profile:  Joseph [name] is a pleasant 66 yo Caucasian male with PMH of HTN, DM, COPD and CAD s/p CABG 2007 who presented to [facility] ED after sent by his pain management doctor for an new onset of atrial flutter with RVR.  Problem List  Past Medical History:  Diagnosis Date  . CAD (coronary artery disease)    MI 2007, CABG  . Cataracts, bilateral   . Complication of anesthesia    DIFFICULTY AFTER CABG HAD CONFUSION POST OP  . COPD (chronic obstructive pulmonary disease) (HCC)   . Diabetes mellitus    Dx 07-2008 A1C 6.2  . Esophageal dysmotility   . Fatty liver 10/11/09  . Gastroesophageal reflux disease   . Gastroparesis   . Hiatal hernia   . Hypertension   . Neuropathy (HCC)    (related to RA per neuro note 05-2011)  . Onychomycosis   . Osteoporosis    per DEXA 05-2008  . Oxygen dependent    3l  . Peripheral polyneuropathy (HCC)    Severe, causing problems with his feet, see surgeries  . Psoriasis   . Rheumatoid arthritis [facility])    [facility] Rheumatology    Past Surgical History:  Procedure Laterality Date  . achiles tendon surgery  8/09  . CARDIAC CATHETERIZATION N/A 01/23/2016   Procedure: [name] Heart Cath and Cors/Grafts Angiography;  Surgeon: Lyn Records, MD;  Location: [facility] INVASIVE CV LAB;  Service: Cardiovascular;  Laterality: N/A;  . CHOLECYSTECTOMY    . CORONARY ARTERY BYPASS GRAFT  2007   (LIMA to the LAD, [name] radial to obtuse marginal, SVG to first diagonal, SVG to PDA. His ejection fraction to 50-55%  . ESOPHAGOGASTRODUODENOSCOPY N/A 10/19/2012   Procedure: ESOPHAGOGASTRODUODENOSCOPY (EGD);  Surgeon: Hart Carwin, MD;  Location: [facility] ENDOSCOPY;  Service: Endoscopy;  Laterality: N/A;  the dilatation is possible.   Marland Kitchen FOOT SURGERY     to tendon release and repair of osteomyelitis   . LARYNGOSCOPY   01/21/2014    DR BATES   . LARYNGOSCOPY N/A 01/21/2014   Procedure: LARYNGOSCOPY;  Surgeon: Christia Reading, MD;  Location: [facility] OR;  Service: ENT;  Laterality: N/A;  micro direct laryngoscopy with prolaryn injection/jet venturi ventilation  . TOE AMPUTATION  4-12/ 3 14   d/t a deformity, found to have osteomyelitis, complicated by post-op foot strtess FX     Allergies  No Known Allergies  HPI  Joseph [name] is a pleasant 66 yo Caucasian male with PMH of HTN, DM, COPD and CAD s/p CABG 2007. Last echocardiogram performed on 01/13/2016 showed EF 50-55%, grade 1 distal dysfunction, mild AS, mildly reduced RVEF. Last cardiac catheterization on 01/23/2016 showed severe three-vessel native CAD with occluded SVG to diagonal, patent free [name] radial artery to OM, patent SVG to distal RCA, patent LIMA to a distal LAD. Medical therapy was recommended. EF was 40% the time. According to the patient, he has been in his usual state of health recently, denies any significant chest discomfort or shortness of breath.   He went to his pain management clinic today on 10/03/2015, on arrival, he noted his heart rate was in the 130s, he stayed there for roughly 30 minutes, by the time he was about to leave his heart rate was persistently in the 130s. He called Dr. Jenene Slicker office today and spoke to  Salley Slaughter, given newly observed tachycardia, he was later transferred to [facility] ED for further evaluation. On initial arrival, EKG interpreted as sinus tachycardia, however appears to be 2:1 atrial flutter at the time. Heart rate was 130s. He was placed on IV heparin and IV diltiazem, his heart rate did slow down somewhat showing atrial flutter with variable ventricular rate. Cardiology has been consulted for atrial flutter with RVR. According to the patient, he has no cardiac awareness of atrial flutter and does not know when it first started. Looking back on the previous office visit, he his heart rate was normal in the 80s when he  saw Dr. Craige Cotta in November, by the time she sought Dr. Sherene Sires in December, his heart rate was 105 at the time.  According to the patient's wife, he think right after his bypass surgery in 2007, he did have some arrhythmia at a time, however there has been no recurrence since then. Last documented EKG was in June 2017 which showed sinus rhythm.   Home Medications  Prior to Admission medications   Medication Sig Start Date End Date Taking? Authorizing Provider  ADVAIR DISKUS 100-50 MCG/DOSE AEPB INHALE 1 PUFF INTO THE LUNGS TWICE DAILY 08/15/16  Yes [name], MD  albuterol (PROVENTIL HFA;VENTOLIN HFA) 108 (90 BASE) MCG/ACT inhaler Inhale 2 puffs into the lungs every 4 (four) hours as needed for wheezing or shortness of breath. 01/20/15  Yes Tammy S Parrett, NP  amitriptyline (ELAVIL) 75 MG tablet Take 2 tablets at bedtime   Yes Historical Provider, MD  Ascorbic Acid (VITAMIN C) 500 MG tablet Take 500 mg by mouth daily.     Yes Historical Provider, MD  aspirin 81 MG tablet Take 81 mg by mouth daily.   Yes Historical Provider, MD  atorvastatin (LIPITOR) 80 MG tablet TAKE 1 TABLET(80 MG) BY MOUTH DAILY 07/31/16  Yes Rollene Rotunda, MD  B-D TB SYRINGE 1CC/27GX1/2" 27G X 1/2" 1 ML MISC Reported on 02/16/2016 02/18/15  Yes Historical Provider, MD  Carbonyl Iron (PERFECT IRON PO) Take 1 tablet by mouth every morning.    Yes Historical Provider, MD  clobetasol cream (TEMOVATE) 0.05 % Apply 1 application topically 2 (two) times daily. 10/19/15  Yes Wanda Plump, MD  folic acid (FOLVITE) 800 MCG tablet Take 800 mcg by mouth daily.   Yes Historical Provider, MD  furosemide (LASIX) 20 MG tablet TAKE 2 TABLETS BY MOUTH EVERY MORNING AND TAKE 1 TABLET EVERY EVENING AT 4:00PM 07/24/16  Yes Rollene Rotunda, MD  Lancets [facility] ULTRASOFT) lancets USE TO TEST BLOOD SUGAR NO MORE THAN TWICE DAILY 07/01/16  Yes Wanda Plump, MD  levofloxacin (LEVAQUIN) 750 MG tablet Take 1 tablet (750 mg total) by mouth daily. One daily stop  if develop aching in joints/ muscles 08/23/16  Yes Jose Angelo A Christene Slates, MD  Loteprednol Etabonate (LOTEMAX) 0.5 % GEL Place 1 drop into both eyes every morning. daily 07/01/14  Yes Historical Provider, MD  Methotrexate Sodium, PF, 50 MG/2ML SOLN Inject 25 mg into the skin once a week.  06/23/14  Yes Historical Provider, MD  metoprolol tartrate (LOPRESSOR) 25 MG tablet TAKE 1 TABLET(25 MG) BY MOUTH TWICE DAILY 07/24/16  Yes Rollene Rotunda, MD  Misc. Devices (ACAPELLA) MISC Use as directed 12/20/13  Yes [name], MD  nitroGLYCERIN (NITROSTAT) 0.4 MG SL tablet Place 1 tablet (0.4 mg total) under the tongue every 5 (five) minutes as needed for chest pain. 11/15/13  Yes Rollene Rotunda, MD  NON  FORMULARY [place] PORTABLE PULSE DOSE OXYGEN oxygen 3L per nasal cannula 24 hours a day Dx: 496   Yes Historical Provider, MD  ONE TOUCH ULTRA TEST test strip USE TO TEST BLOOD SUGAR NO MORE THAN TWICE DAILY 07/01/16  Yes Wanda Plump, MD  oxyCODONE-acetaminophen (PERCOCET) 7.5-325 MG per tablet Take 1 tablet by mouth 3 (three) times daily. 3 times a day   Yes Historical Provider, MD  pantoprazole (PROTONIX) 40 MG tablet Take 1 tablet (40 mg total) by mouth 2 (two) times daily. 03/08/16  Yes Wanda Plump, MD  potassium chloride (K-DUR,KLOR-CON) 10 MEQ tablet Take two (4) tablets (40 meq total) by mouth every morning and take one (2) tablet (20 meq total) by mouth every evening. 07/28/15  Yes Rollene Rotunda, MD  predniSONE (DELTASONE) 5 MG tablet Take 5 mg by mouth daily. Take as directed   Yes Historical Provider, MD  Respiratory Therapy Supplies (FLUTTER) DEVI 1 Inhaler by Does not apply route 2 (two) times daily. 05/18/15  Yes [name], MD  tiotropium (SPIRIVA HANDIHALER) 18 MCG inhalation capsule INHALE 1 CAPSULE BY MOUTH IN HANDIHALER DAILY 12/12/15  Yes Tammy S Parrett, NP  vitamin B-12 (CYANOCOBALAMIN) 250 MCG tablet Take 250 mcg by mouth daily.     Yes Historical Provider, MD    Family History  Family History   Problem Relation Age of Onset  . Heart disease Mother   . Diverticulitis Mother   . Heart disease Father   . Heart attack Father     x2  . Colon cancer Neg Hx   . Prostate cancer Neg Hx     Social History  Social History   Social History  . Marital status: Married    Spouse name: N/A  . Number of children: 0  . Years of education: N/A   Occupational History  . Disabled-retired Unemployed   Social History Main Topics  . Smoking status: Former Smoker    Packs/day: 1.00    Years: 40.00    Quit date: 08/26/2005  . Smokeless tobacco: Former Neurosurgeon    Quit date: 04/02/2006     Comment: smoked x 40 years, 1 ppd  . Alcohol use No  . Drug use: No  . Sexual activity: Not on file   Other Topics Concern  . Not on file   Social History Narrative  . No narrative on file     Review of Systems General:  No chills, fever, night sweats or weight changes.  Cardiovascular:  No chest pain, dyspnea on exertion, edema, orthopnea, palpitations, paroxysmal nocturnal dyspnea. Dermatological: No rash, lesions/masses Respiratory: No cough, dyspnea Urologic: No hematuria, dysuria Abdominal:   No nausea, vomiting, diarrhea, bright red blood per rectum, melena, or hematemesis Neurologic:  No visual changes, wkns, changes in mental status. All other systems reviewed and are otherwise negative except as noted above.  Physical Exam  Blood pressure 102/75, pulse (!) 135, temperature 98.2 F (36.8 C), temperature source Oral, resp. rate 24, SpO2 97 %.  General: Pleasant, NAD Psych: Normal affect. Neuro: Alert and oriented X 3. Moves all extremities spontaneously. HEENT: Normal  Neck: Supple without bruits or JVD. Lungs:  Resp regular and unlabored, CTA. Heart: tachycardic, irregular. no s3, s4, or murmurs. Abdomen: Soft, non-tender, non-distended, BS + x 4.  Extremities: No clubbing, cyanosis or edema. DP/PT/Radials 2+ and equal bilaterally.  Labs  Troponin [facility] of Care Test)  Recent  Labs  10/02/16 1435  TROPIPOC 0.00   No results for input(s):  CKTOTAL, CKMB, TROPONINI in the last 72 hours. Lab Results  Component Value Date   WBC 5.1 10/02/2016   HGB 14.1 10/02/2016   HCT 42.1 10/02/2016   MCV 90.3 10/02/2016   PLT 133 (L) 10/02/2016    Recent Labs Lab 10/02/16 1426  NA 139  K 3.7  CL 105  CO2 24  BUN 10  CREATININE 0.73  CALCIUM 9.7  GLUCOSE 212*   Lab Results  Component Value Date   CHOL 135 05/24/2016   HDL 35.40 (L) 05/24/2016   LDLCALC 77 05/24/2016   TRIG 111.0 05/24/2016   Lab Results  Component Value Date   DDIMER (H) 03/29/2008    8.43        AT THE INHOUSE ESTABLISHED CUTOFF VALUE OF 0.48 ug/mL FEU, THIS ASSAY HAS BEEN DOCUMENTED IN THE LITERATURE TO HAVE     Radiology/Studies  Dg Chest 2 View  Result Date: 10/02/2016 CLINICAL DATA:  Tachycardia EXAM: CHEST  2 VIEW COMPARISON:  08/20/2016 FINDINGS: Cardiac shadow is stable. Postsurgical changes are again seen. Chronic fibrotic changes are noted throughout both lungs as well as a chronic cavitary appearing lesion in the [name] upper lobe. No bony abnormality is seen. IMPRESSION: Chronic changes are noted. Given some technical variations in the film no acute abnormality is seen. Electronically Signed   By: Alcide Clever M.D.   On: 10/02/2016 16:49    ECG  Atrial flutter with variable ventricular rate  Echocardiogram 01/21/2016  ------------------------------------------------------------------- LV EF: 50% -   55%  - [name] ventricle: The cavity size was normal. There was mild   concentric hypertrophy. Systolic function was normal. The   estimated ejection fraction was in the range of 50% to 55%. Wall   motion was normal; there were no regional wall motion   abnormalities. Doppler parameters are consistent with abnormal   [name] ventricular relaxation (grade 1 diastolic dysfunction).   There was no evidence of elevated ventricular filling pressure by   Doppler parameters. - Aortic  valve: Trileaflet; moderately thickened, moderately   calcified leaflets. There was very mild stenosis. - [name] atrium: The atrium was at the upper limits of normal in   size. - Right ventricle: The cavity size was mildly dilated. Wall   thickness was normal. Systolic function was mildly reduced. - Tricuspid valve: There was mild regurgitation. - Pulmonary arteries: Systolic pressure was within the normal   range. - Inferior vena cava: The vessel was normal in size.  Impressions:  - Low normal LVEF, grade 1 diastolic dysfunction with normal   filling pressures.   Mildly dilated RVEF with mild RV systolic dysfunction.   Normal RVSP.    ASSESSMENT AND PLAN  1. Paroxysmal atrial flutter with RVR  - This patients CHA2DS2-VASc Score and unadjusted Ischemic Stroke Rate (% per year) is equal to 3.2 % stroke rate/year from a score of 3  Above score calculated as 1 point each if present [CHF, HTN, DM, Vascular=MI/PAD/Aortic Plaque, Age if 65-74, or Male] Above score calculated as 2 points each if present [Age > 75, or Stroke/TIA/TE]  - Unknown duration, patient has no cardiac awareness. Based on vital signs obtained during the recent office visits, he is atrial flutter may have been going on since December.  - Various option has been discussed with the patient, per Dr. Diona Browner, plan to focus of rate control at this time, will consult electrophysiology service in the morning to further discuss possibility of ablation procedure. We'll defer to EP regarding choices of  systemic anticoagulation. 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 He denies any recent fall in the past year, he also denies any significant bleeding issue that required transfusion. No significant contraindication to systemic anticoagulation noted.  - Note patient had atrial fibrillation after bypass surgery in 2007.  2. CAD s/p CABG  2007: Last cardiac catheterization was in May 2017, noted to have occluded SVG to diagonal at the time. Medical therapy was pursued.   3. HTN  4. DM  5. COPD  Signed, Azalee Course, PA-C 10/02/2016, 7:09 PM   Attending note:  Patient seen and examined. Reviewed records and discussed the case with Joseph [name]. Joseph [name] is a patient of Dr. Antoine Poche presenting to the ER with elevated heart rate and newly documented atrial flutter. He presented for a routine visit with his pain management specialist today and was noted to have a heart rate in the 130s that persisted. He had no sense of palpitations or chest pain, stating that he felt well otherwise. In reviewing the chart he did have a Pulmonary clinic visit in late December at which time heart rate was 105, prior to that in November it was 41. Absolute duration of his atrial flutter is uncertain. He does not have any clearly documented history of recurring atrial arrhythmias other than having brief postoperative atrial fibrillation after CABG in 2007. His CHADSVASC score is 3. Otherwise from a cardiac perspective he has been stable without angina, underwent cardiac catheterization in May of last year. At that time he had severe native vessel disease with occluded SVG to diagonal, patent free radial to the obtuse marginal, patent SVG to distal RCA off of the circumflex, and patent LIMA to LAD. He was managed medically. LVEF was 50-55% by echocardiogram.  On examination the ER he appears comfortable. He was placed on intravenous heparin and diltiazem by ER staff, heart rate down in the 110-120 range on my assessment. Systolic blood pressure 100-120 range. Lungs exhibit diminished breath sounds but no active wheezing or rhonchi. Cardiac exam with largely regular rhythm, soft systolic murmur, no S3. He has arthritic deformities related to rheumatoid arthritis. No pitting edema distally. Lab work shows potassium 3.7, creatinine 0.7, point-of-care troponin I  0.0, hemoglobin 14.1, platelet 133. ECG shows typical atrial flutter with largely 2:1 conduction. Chest x-ray shows chronic changes.  Newly documented typical atrial flutter, duration is however uncertain and could potentially have been there for the last month. He does not report any palpitations or increasing breathlessness. CHADSVASC score is 3. Plan is hospital admission, would continue intravenous diltiazem, however transition from IV heparin to Eliquis. We will ask the EP service to see him regarding possibility of a TEE guided ablation (I spoke briefly with Dr. Graciela Husbands by phone). Follow-up echocardiogram will be arranged to reassess LVEF and exclude tachycardia-mediated cardiomyopathy. At this point ACS is not suspected.  Jonelle Sidle, M.D., F.A.C.C.

## 2016-10-02 NOTE — ED Provider Notes (Signed)
Emergency Department Provider Note   I have reviewed the triage vital signs and the nursing notes.   HISTORY  Chief Complaint Tachycardia   HPI Joseph [name] is a 66 y.o. male with PMH of CAD, COPD, DM, and HTN presents to the ED for evaluation of tachycardia from his pain mgmt clinic. The patient has no history of a-fib but does have a heart history significant for CAD and CABG. He is not on anticoagulation. He denies feeling any chest pain, palpitations, difficulty breathing, lightheadedness. No associated illness such as vomiting, diarrhea, or generalized weakness. No modifying factors. No associated symptoms. He wert to his regularly scheduled appointment and had routine vital signs checked which showed the tachycardia. After the appointment he was referred to the ED.    Past Medical History:  Diagnosis Date  . CAD (coronary artery disease)    MI 2007, CABG  . Cataracts, bilateral   . Complication of anesthesia    DIFFICULTY AFTER CABG HAD CONFUSION POST OP  . COPD (chronic obstructive pulmonary disease) (HCC)   . Diabetes mellitus    Dx 07-2008 A1C 6.2  . Esophageal dysmotility   . Fatty liver 10/11/09  . Gastroesophageal reflux disease   . Gastroparesis   . Hiatal hernia   . Hypertension   . Neuropathy (HCC)    (related to RA per neuro note 05-2011)  . Onychomycosis    toenails  . Osteoporosis    per DEXA 05-2008  . Oxygen dependent    3l  . Peripheral polyneuropathy (HCC)    Severe, causing problems with his feet, see surgeries  . Psoriasis   . Rheumatoid arthritis [facility])    [facility] Rheumatology    Patient Active Problem List   Diagnosis Date Noted  . Pulmonary infiltrates 02/25/2016  . Hemoptysis 02/23/2016  . Left sided chest pain   . Chronic diastolic heart failure (HCC)   . Troponin level elevated   . Chest pain 01/19/2016  . PCP NOTES >>>>>>>>>>>>>>>>>>>>>>>>>>>>>>>>>>>> 05/16/2015  . Chronic obstructive pulmonary emphysema (HCC) 01/13/2015  .  Bronchiectasis with acute exacerbation (HCC) 08/31/2014  . Hepatic steatosis 04/17/2014  . Bronchiectasis (HCC) 05/05/2013  . Recurrent pneumonia 11/30/2012  . Pulmonary nodules 11/30/2012  . Gastroparesis 10/20/2012  . Barrett's esophagus 10/19/2012  . Chronic respiratory failure with hypoxia (HCC) 10/17/2012  . Annual physical exam 05/08/2012  . Sicca syndrome (HCC) 10/11/2011  . CAD (coronary artery disease)   . Carotid stenosis 12/10/2010  . Abnormal nuclear cardiac imaging test 12/04/2010  . Rheumatoid arthritis with rheumatoid factor (HCC) 11/02/2010  . HOARSENESS 10/22/2010  . DYSLIPIDEMIA 09/12/2009  . DMII (diabetes mellitus, type 2) (HCC) 08/01/2008  . Pain in Soft Tissues of Limb 07/13/2008  . Osteoporosis 07/13/2008  . COPD with emphysema (HCC) 07/20/2007  . Coronary atherosclerosis of unspecified type of vessel, native or graft 11/25/2006  . 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 Essential hypertension 09/16/2006  . Rheumatoid arthritis (HCC) 09/16/2006  . GERD, Barrett's (09-2012 ), gastroparesis 06/06/2006  . PSORIASIS 06/06/2006    Past Surgical History:  Procedure Laterality Date  . achiles tendon surgery  8/09  . CARDIAC CATHETERIZATION N/A 01/23/2016   Procedure: Left Heart Cath and Cors/Grafts Angiography;  Surgeon: Lyn Records, MD;  Location: [facility] INVASIVE CV LAB;  Service: Cardiovascular;  Laterality: N/A;  . CHOLECYSTECTOMY    . CORONARY ARTERY BYPASS GRAFT  2007   (LIMA to the LAD, left radial to obtuse marginal, SVG to first diagonal, SVG to  PDA. His ejection fraction to 50-55%  . ESOPHAGOGASTRODUODENOSCOPY N/A 10/19/2012   Procedure: ESOPHAGOGASTRODUODENOSCOPY (EGD);  Surgeon: Hart Carwin, MD;  Location: [facility] ENDOSCOPY;  Service: Endoscopy;  Laterality: N/A;  the dilatation is possible.   Marland Kitchen FOOT SURGERY     to tendon release and repair of osteomyelitis   . LARYNGOSCOPY  01/21/2014    DR BATES   . LARYNGOSCOPY N/A 01/21/2014   Procedure:  LARYNGOSCOPY;  Surgeon: Christia Reading, MD;  Location: [facility] OR;  Service: ENT;  Laterality: N/A;  micro direct laryngoscopy with prolaryn injection/jet venturi ventilation  . TOE AMPUTATION  4-12/ 3 14   d/t a deformity, found to have osteomyelitis, complicated by post-op foot strtess FX    Current Outpatient Rx  . Order #: [id number] Class: Normal  . Order #: [id number] Class: Normal  . Order #: [id number] Class: Historical Med  . Order #: [id number] Class: Historical Med  . Order #: [id number] Class: Historical Med  . Order #: [id number] Class: Normal  . Order #: [id number] Class: Historical Med  . Order #: [id number] Class: Historical Med  . Order #: [id number] Class: Historical Med  . Order #: [id number] Class: Historical Med  . Order #: [id number] Class: Normal  . Order #: [id number] Class: Normal  . Order #: [id number] Class: Normal  . Order #: [id number] Class: Historical Med  . Order #: [id number] Class: Historical Med  . Order #: [id number] Class: Normal  . Order #: [id number] Class: Print  . Order #: [id number] Class: Normal  . Order #: [id number] Class: Historical Med  . Order #: [id number] Class: Normal  . Order #: [id number] Class: Historical Med  . Order #: [id number] Class: Normal  . Order #: [id number] Class: Normal  . Order #: [id number] Class: Historical Med  . Order #: [id number] Class: Print  . Order #: [id number] Class: Normal  . Order #: [id number] Class: Historical Med    Allergies Patient has no known allergies.  Family History  Problem Relation Age of Onset  . Heart disease Mother   . Diverticulitis Mother   . Heart disease Father   . Heart attack Father     x2  . Colon cancer Neg Hx   . Prostate cancer Neg Hx     Social History Social History  Substance Use Topics  . Smoking status: Former Smoker    Packs/day: 1.00    Years: 40.00    Quit date: 08/26/2005  . Smokeless tobacco: Former Neurosurgeon    Quit date: 04/02/2006     Comment: smoked x 40 years, 1 ppd  . Alcohol use No    Review of  Systems  Constitutional: No fever/chills Eyes: No visual changes. ENT: No sore throat. Cardiovascular: Denies chest pain. Respiratory: Denies shortness of breath. Gastrointestinal: No abdominal pain.  No nausea, no vomiting.  No diarrhea.  No constipation. Genitourinary: Negative for dysuria. Musculoskeletal: Negative for back pain. Skin: Negative for rash. Neurological: Negative for headaches, focal weakness or numbness.  10-point ROS otherwise negative.  ____________________________________________   PHYSICAL EXAM:  VITAL SIGNS: ED Triage Vitals [10/02/16 1416]  Enc Vitals Group     BP 121/65     Pulse Rate (!) 139     Resp 22     Temp 98.2 F (36.8 C)     Temp Source Oral     SpO2 97 %     Pain Score 0   Constitutional: Alert and oriented. Well appearing and in no acute distress. Eyes: Conjunctivae are normal.  Head: Atraumatic. Nose: No congestion/rhinnorhea. Mouth/Throat: Mucous membranes are moist.  Oropharynx non-erythematous. Neck: No stridor.   Cardiovascular: A-flutter. Good peripheral  circulation. Grossly normal heart sounds.   Respiratory: Normal respiratory effort.  No retractions. Lungs CTAB. Gastrointestinal: Soft and nontender. No distention.  Musculoskeletal: No lower extremity tenderness nor edema. No gross deformities of extremities. Neurologic:  Normal speech and language. No gross focal neurologic deficits are appreciated.  Skin:  Skin is warm, dry and intact. No rash noted. Psychiatric: Mood and affect are normal. Speech and behavior are normal.  ____________________________________________   LABS (all labs ordered are listed, but only abnormal results are displayed)  Labs Reviewed  BASIC METABOLIC PANEL - Abnormal; Notable for the following:       Result Value   Glucose, Bld 212 (*)    All other components within normal limits  CBC - Abnormal; Notable for the following:    RDW 15.6 (*)    Platelets 133 (*)    All other components  within normal limits  TSH  I-STAT TROPOININ, ED   ____________________________________________  EKG   EKG Interpretation  Date/Time:  Wednesday October 02 2016 17:15:22 EST Ventricular Rate:  122 PR Interval:    QRS Duration: 91 QT Interval:  419 QTC Calculation: 597 R Axis:   58 Text Interpretation:  Atrial flutter with predominant 2:1 AV block Inferior infarct, acute (RCA) Prolonged QT interval Probable RV involvement, suggest recording right precordial leads No STEMI.  Confirmed by LONG MD, JOSHUA [phone]) on 10/02/2016 5:21:59 PM       ____________________________________________  RADIOLOGY  Dg Chest 2 View  Result Date: 10/02/2016 CLINICAL DATA:  Tachycardia EXAM: CHEST  2 VIEW COMPARISON:  08/20/2016 FINDINGS: Cardiac shadow is stable. Postsurgical changes are again seen. 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 No bony abnormality is seen. IMPRESSION: Chronic changes are noted. Given some technical variations in the film no acute abnormality is seen. Electronically Signed   By: Alcide Clever M.D.   On: 10/02/2016 16:49    ____________________________________________   PROCEDURES  Procedure(s) performed:   Procedures  CRITICAL CARE Performed by: Maia Plan Total critical care time: 30 minutes Critical care time was exclusive of separately billable procedures and treating other patients. Critical care was necessary to treat or prevent imminent or life-threatening deterioration. Critical care was time spent personally by me on the following activities: development of treatment plan with patient and/or surrogate as well as nursing, discussions with consultants, evaluation of patient's response to treatment, examination of patient, obtaining history from patient or surrogate, ordering and performing treatments and interventions, ordering and review of laboratory studies, ordering and  review of radiographic studies, pulse oximetry and re-evaluation of patient's condition.  Alona Bene, MD Emergency Medicine  ____________________________________________   INITIAL IMPRESSION / ASSESSMENT AND PLAN / ED COURSE  Pertinent labs & imaging results that were available during my care of the patient were reviewed by me and considered in my medical decision making (see chart for details).  Patient resents to the emergency department for evaluation of asymptomatic A flutter. Patient has 2-1 a-flutter with rate in the 130s to 140s. Normal blood pressure. No history of similar symptoms. Patient is not anticoagulated. Not a cardioversion candidate. Plan for labs, chest x-ray with recent pneumonia, and cardiology consultation.   05:50 PM Spoke with Cardiology. They will be down to see the patient. Agree with plan for diltiazem infusion and bolus. Agree with plan for heparin. No contraindication for anticoagulation.  ____________________________________________  FINAL CLINICAL IMPRESSION(S) / ED DIAGNOSES  Final diagnoses:  Atrial flutter, unspecified type (HCC)     MEDICATIONS GIVEN DURING THIS VISIT:  Medications  diltiazem (CARDIZEM) 1 mg/mL load via infusion 10 mg (not administered)    And  diltiazem (CARDIZEM) 100 mg in dextrose 5 % 100 mL (1 mg/mL) infusion (not administered)  sodium chloride 0.9 % bolus 1,000 mL (1,000 mLs Intravenous New Bag/Given 10/02/16 1554)     NEW OUTPATIENT MEDICATIONS STARTED DURING THIS VISIT:  None   Note:  This document was prepared using Dragon voice recognition software and may include unintentional dictation errors.  Alona Bene, MD Emergency Medicine   Maia Plan, MD 10/02/16 [phone]

## 2016-10-02 NOTE — Telephone Encounter (Signed)
Pt of Dr. Antoine Poche. He does not have an afib or arrythmia history that I can see. C/o tachycardia today, newly observed.  Patient was at another doctor's appt today. States he was observed for 30 mins with rapid pulse in 130s. Rate has not slowed or changed in that time. He's not having any symptoms currently, but they advised he may need eval in emergency room and I recommended this as well. Pt going to [facility] via private vehicle. I've alerted Trish, Cardmaster, via VM message.

## 2016-10-02 NOTE — ED Notes (Signed)
Attempted report 

## 2016-10-03 ENCOUNTER — Observation Stay (HOSPITAL_BASED_OUTPATIENT_CLINIC_OR_DEPARTMENT_OTHER): Payer: BC Managed Care – PPO

## 2016-10-03 DIAGNOSIS — I4892 Unspecified atrial flutter: Secondary | ICD-10-CM

## 2016-10-03 DIAGNOSIS — I2510 Atherosclerotic heart disease of native coronary artery without angina pectoris: Secondary | ICD-10-CM | POA: Diagnosis not present

## 2016-10-03 LAB — BASIC METABOLIC PANEL
ANION GAP: 10 (ref 5–15)
BUN: 11 mg/dL (ref 6–20)
CO2: 25 mmol/L (ref 22–32)
Calcium: 9 mg/dL (ref 8.9–10.3)
Chloride: 102 mmol/L (ref 101–111)
Creatinine, Ser: 0.64 mg/dL (ref 0.61–1.24)
GFR calc Af Amer: >60 mL/min (ref >60)
GFR calc non Af Amer: >60 mL/min (ref >60)
GLUCOSE: 125 mg/dL — AB (ref 65–99)
POTASSIUM: 3.7 mmol/L (ref 3.5–5.1)
Sodium: 137 mmol/L (ref 135–145)

## 2016-10-03 LAB — ECHOCARDIOGRAM COMPLETE
Height: 72 in
Weight: 3668.45 oz

## 2016-10-03 LAB — CBC
HEMATOCRIT: 37.4 % — AB (ref 39.0–52.0)
Hemoglobin: 12.4 g/dL — ABNORMAL LOW (ref 13.0–17.0)
MCH: 30 pg (ref 26.0–34.0)
MCHC: 33.2 g/dL (ref 30.0–36.0)
MCV: 90.6 fL (ref 78.0–100.0)
PLATELETS: 129 10*3/uL — AB (ref 150–400)
RBC: 4.13 MIL/uL — ABNORMAL LOW (ref 4.22–5.81)
RDW: 15.9 % — AB (ref 11.5–15.5)
WBC: 5.8 10*3/uL (ref 4.0–10.5)

## 2016-10-03 MED ORDER — OXYCODONE-ACETAMINOPHEN 5-325 MG PO TABS: 1.0000 | ORAL_TABLET | Freq: Three times a day (TID) | ORAL | Status: DC | PRN

## 2016-10-03 MED ORDER — METHOTREXATE SODIUM CHEMO INJECTION 50 MG/2ML
25.0000 mg | INTRAMUSCULAR | Status: DC
  Administered 2016-10-03: 25 mg via SUBCUTANEOUS
  Filled 2016-10-03: qty 1

## 2016-10-03 MED ORDER — DILTIAZEM HCL 60 MG PO TABS
60.0000 mg | ORAL_TABLET | Freq: Four times a day (QID) | ORAL | Status: DC
  Administered 2016-10-03: 60 mg via ORAL
  Filled 2016-10-03: qty 1

## 2016-10-03 MED ORDER — DILTIAZEM HCL ER COATED BEADS 240 MG PO CP24: 240.0000 mg | ORAL_CAPSULE | Freq: Every day | ORAL | Status: DC

## 2016-10-03 MED ORDER — DILTIAZEM HCL ER COATED BEADS 240 MG PO CP24: 240.0000 mg | ORAL_CAPSULE | Freq: Every day | ORAL | 3 refills | Status: DC

## 2016-10-03 MED ORDER — APIXABAN 5 MG PO TABS: 5.0000 mg | ORAL_TABLET | Freq: Two times a day (BID) | ORAL | 3 refills | Status: DC

## 2016-10-03 NOTE — Progress Notes (Signed)
Discharge instructions printed and reviewed with patient and wife, and copy given for them to take home. All questions addressed at this time. New prescriptions reviewed, and informed patient there were called in to his [facility] pharmacy. IV's and telemetry monitor removed. Room searched for patient belongings, and confirmed with patient that all valuables were accounted for. Patient's wife assisted patient to dress, then staff escorted patient to discharge via wheelchair.

## 2016-10-03 NOTE — Progress Notes (Signed)
Progress Note  Patient Name: Joseph [name] Date of Encounter: 10/03/2016  Primary Cardiologist: Dr Antoine Poche  Subjective   Patient feels great. No chest pain, dyspnea, palpitations, lightheadedness. He never had any awareness of his arrhythmia.  Inpatient Medications    Scheduled Meds: . amitriptyline  150 mg Oral QHS  . apixaban  5 mg Oral BID  . aspirin  81 mg Oral Daily  . atorvastatin  80 mg Oral q1800  . folic acid  1 mg Oral Daily  . furosemide  20 mg Oral Daily  . furosemide  40 mg Oral Daily  . methotrexate  25 mg Subcutaneous Q Thu  . metoprolol tartrate  25 mg Oral BID  . mometasone-formoterol  2 puff Inhalation BID  . oxyCODONE-acetaminophen  1 tablet Oral TID  . pantoprazole  40 mg Oral BID  . potassium chloride  20 mEq Oral Daily  . potassium chloride  40 mEq Oral Daily  . predniSONE  5 mg Oral QAC breakfast  . tiotropium  18 mcg Inhalation Daily  . vitamin B-12  1,000 mcg Oral Daily  . vitamin C  500 mg Oral Daily   Continuous Infusions: . diltiazem (CARDIZEM) infusion 10 mg/hr (10/03/16 0212)   PRN Meds: albuterol, nitroGLYCERIN, ondansetron (ZOFRAN) IV, oxyCODONE-acetaminophen   Vital Signs    Vitals:   10/03/16 0457 10/03/16 0907 10/03/16 0908 10/03/16 1028  BP: (!) 98/59   108/64  Pulse: 62     Resp: 18     Temp: 98.1 F (36.7 C)     TempSrc: Oral     SpO2: 96% 98% 98%   Weight:      Height:        Intake/Output Summary (Last 24 hours) at 10/03/16 1204 Last data filed at 10/03/16 0330  Gross per 24 hour  Intake             1000 ml  Output             1800 ml  Net             -800 ml   Filed Weights   10/02/16 2302  Weight: 229 lb 4.5 oz (104 kg)    Telemetry    Atrial flutter in the 60's- Personally Reviewed  ECG    Atrial flutter, 134 bpm - Personally Reviewed  Physical Exam   GEN: No acute distress.   Neck: No JVD Cardiac: RRR, no murmurs, rubs, or gallops.  Respiratory: Clear to auscultation bilaterally. GI: Soft,  nontender, non-distended  MS: No edema; arthritic changes, lower leg chronic venous changes Neuro:  Nonfocal  Psych: Normal affect   Labs    Chemistry Recent Labs Lab 10/02/16 1426 10/03/16 0316  NA 139 137  K 3.7 3.7  CL 105 102  CO2 24 25  GLUCOSE 212* 125*  BUN 10 11  CREATININE 0.73 0.64  CALCIUM 9.7 9.0  GFRNONAA >60 >60  GFRAA >60 >60  ANIONGAP 10 10     Hematology Recent Labs Lab 10/02/16 1426 10/03/16 0316  WBC 5.1 5.8  RBC 4.66 4.13*  HGB 14.1 12.4*  HCT 42.1 37.4*  MCV 90.3 90.6  MCH 30.3 30.0  MCHC 33.5 33.2  RDW 15.6* 15.9*  PLT 133* 129*    Cardiac EnzymesNo results for input(s): TROPONINI in the last 168 hours.  Recent Labs Lab 10/02/16 1435  TROPIPOC 0.00     BNPNo results for input(s): BNP, PROBNP in the last 168 hours.   DDimer No  results for input(s): DDIMER in the last 168 hours.   Radiology    Dg Chest 2 View  Result Date: 10/02/2016 CLINICAL DATA:  Tachycardia EXAM: CHEST  2 VIEW COMPARISON:  08/20/2016 FINDINGS: Cardiac shadow is stable. Postsurgical changes are again seen. 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 No bony abnormality is seen. IMPRESSION: Chronic changes are noted. Given some technical variations in the film no acute abnormality is seen. Electronically Signed   By: Alcide Clever M.D.   On: 10/02/2016 16:49    Cardiac Studies   Echo pending.   01/21/2016 Echo showed EF 50-55%, grade 1 distal dysfunction, mild AS, mildly reduced RVEF.   Last cardiac catheterization on 01/23/2016 showed severe three-vessel native CAD with occluded SVG to diagonal, patent free left radial artery to OM, patent SVG to distal RCA, patent LIMA to a distal LAD. Medical therapy was recommended. EF was 40% the time.  Patient Profile     66 y.o. male with PMH of HTN, DM, COPD and CAD s/p CABG 2007 who presented to [facility] ED after sent by his pain  management doctor for an new onset of atrial flutter with RVR.   Assessment & Plan    Atrial flutter with RVR -New, of unknown duration. Pt has no awareness, asymptomatic. Was noted at pain management clinic. Probably has afib after his bypass. Of note pt had clinic HR 80's in the fall and was 105 in December. He had pneumonia in Dec-Jan and this could have had atrial arrhythmia at that time.  -Cardizem drip started and is infusing at 10 mg/h with good rate control. Will switch to po. -Aflutter started in the 130's, down to the 60's early this morning.  -This patients CHA2DS2-VASc Score is 3 (HTN, DM, CAD). Heparin drip started and changed to eliquis. -Will need long term anticoagulation. -Plan rate control for now. Possible cardioversion or ablation in the future.  CAD s/p CABG 2007 -Last cardiac cath in 12/2015 showed occluded SVG to diagonal. Medical therapy advised. -Continue aspirin, beta blocker  HTN -Continue lasix, metoprolol -Monitor BP with addition of cardizem. Currently BP is soft 98-120/59-76  DM -Controlled with diet and exercise -Last Hgb A1c 6.3 in 04/2016  COPD  HLD -Continue statin  Signed, Berton Bon, NP  10/03/2016, 12:04 PM    Attending Note:   The patient was seen and examined.  Agree with assessment and plan as noted above.  Changes made to the above note as needed.  Patient seen and independently examined with Lizabeth Leyden, NP.   We discussed all aspects of the encounter. I agree with the assessment and plan as stated above.  1. Atrial flutter: The patient presented last night with rapid atrial flutter. His heart rate is much better controlled on the combination of metoprolol and diltiazem. He has been started on Eliquis  5 mg twice a day. We discussed that some detail atrial flutter ablation. It is likely that we could send him home from the hospital tonight on metoprolol, diltiazem for rate control and Eliquis  for anticoagulation.  As long as his  rate stays controlled we can have him electively see Dr. Ladona Ridgel in the office for consideration for atrial flutter ablation.  2. Coronary artery disease. He's not had any episodes of angina. Heart catheterization in May, 2017 revealed severe native coronary artery disease. He has a patent LIMA to the LAD, patent SVG to distal  RCA, and a patent free RIMA to the obtuse marginal artery.    I have spent a total of 40 minutes with patient reviewing hospital  notes , telemetry, EKGs, labs and examining patient as well as establishing an assessment and plan that was discussed with the patient. > 50% of time was spent in direct patient care.    Vesta Mixer, Montez Hageman., MD, [facility] 10/03/2016, 3:30 PM [address]  [address] Office [phone] Pager [phone]

## 2016-10-03 NOTE — Discharge Summary (Signed)
Discharge Summary    Patient ID: Joseph [name],  MRN: [id number], DOB/AGE: [birth year] 66 y.o.  Admit date: 10/02/2016 Discharge date: 10/03/2016  Primary Care Provider: Willow Ora Primary Cardiologist: Dr Antoine Poche  Discharge Diagnoses    Active Problems:   Atrial flutter with rapid ventricular response (HCC)   Allergies No Known Allergies  Diagnostic Studies/Procedures    10/03/2016- Echo Study Conclusions  - Left ventricle: The cavity size was normal. Wall thickness was   normal. Systolic function was normal. The estimated ejection   fraction was in the range of 55% to 60%. Wall motion was normal;   there were no regional wall motion abnormalities. - Aortic valve: Noncoronary cusp mobility was severely restricted.   Valve area (VTI): 2.16 cm^2. Valve area (Vmax): 2.2 cm^2. Valve   area (Vmean): 2.01 cm^2. - Left atrium: The atrium was mildly dilated. - Right ventricle: The cavity size was mildly dilated. Systolic   function was mildly reduced. - Right atrium: The atrium was mildly dilated. - Tricuspid valve: There was moderate regurgitation. - Pulmonary arteries: Systolic pressure was mildly increased. PA   peak pressure: 39 mm Hg (S). _____________   History of Present Illness     66 y.o. male with PMH of HTN, DM, COPD and CAD s/p CABG 2007 who presented to [facility] ED after sent by his pain management doctor for an new onset of atrial flutter with RVR. He was asymptomatic.  Hospital Course     Consultants: for out patient EP   The patient presented last night (10/02/2016) with rapid atrial flutter in the 140's. Cardizem was used in addition to his usual metoprolol. His heart rate is much better controlled on the combination of metoprolol and diltiazem. He continues to be in atrial flutter with rates in the 60's-70's.This patients CHA2DS2-VASc Score is 4 (HTN, DM, CAD, age).  He has been started on Eliquis  5 mg twice a day.  We discussed in some detail atrial  flutter ablation. We will discharge home on diltiazem for rate control and eliquis for stroke risk reduction and plan follow up with his cardiologist Dr. Antoine Poche and for EP evaluation for possible ablation with Dr. Ladona Ridgel.   Heart catheterization in May, 2017 revealed severe native coronary artery disease. He has a patent LIMA to the LAD, patent SVG to distal RCA, and a patent free RIMA to the obtuse marginal artery.  Patient has been seen by Dr. Elease Hashimoto today and deemed ready for discharge home. All follow up appointments have been scheduled. Discharge medications are listed below. __________  Discharge Vitals Blood pressure 116/66, pulse 70, temperature 98.4 F (36.9 C), temperature source Oral, resp. rate 18, height 6' (1.829 m), weight 229 lb 4.5 oz (104 kg), SpO2 100 %.  Filed Weights   10/02/16 2302  Weight: 229 lb 4.5 oz (104 kg)    Labs & Radiologic Studies    CBC  Recent Labs  10/02/16 1426 10/03/16 0316  WBC 5.1 5.8  HGB 14.1 12.4*  HCT 42.1 37.4*  MCV 90.3 90.6  PLT 133* 129*   Basic Metabolic Panel  Recent Labs  10/02/16 1426 10/03/16 0316  NA 139 137  K 3.7 3.7  CL 105 102  CO2 24 25  GLUCOSE 212* 125*  BUN 10 11  CREATININE 0.73 0.64  CALCIUM 9.7 9.0   Liver Function Tests No results for input(s): AST, ALT, ALKPHOS, BILITOT, PROT, ALBUMIN in the last 72 hours. No results for input(s): LIPASE, AMYLASE  in the last 72 hours. Cardiac Enzymes No results for input(s): CKTOTAL, CKMB, CKMBINDEX, TROPONINI in the last 72 hours. BNP Invalid input(s): POCBNP D-Dimer No results for input(s): DDIMER in the last 72 hours. Hemoglobin A1C No results for input(s): HGBA1C in the last 72 hours. Fasting Lipid Panel No results for input(s): CHOL, HDL, LDLCALC, TRIG, CHOLHDL, LDLDIRECT in the last 72 hours. Thyroid Function Tests  Recent Labs  10/02/16 2201  TSH 1.031   _____________  Dg Chest 2 View  Result Date: 10/02/2016 CLINICAL DATA:  Tachycardia EXAM:  CHEST  2 VIEW COMPARISON:  08/20/2016 FINDINGS: Cardiac shadow is stable. Postsurgical changes are again seen. 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 No bony abnormality is seen. IMPRESSION: Chronic changes are noted. Given some technical variations in the film no acute abnormality is seen. Electronically Signed   By: Alcide Clever M.D.   On: 10/02/2016 16:49   Disposition   Pt is being discharged home today in good condition.  Follow-up Plans & Appointments    Follow-up Information    Rollene Rotunda, MD Follow up on 10/14/2016.   Specialty:  Cardiology Why:  at 10:45 for cardiology hospital follow up. Please bring all of your medications with you to your appointment.  Contact information: [address] [place] [address] [phone]        Lewayne Bunting, MD Follow up on 10/28/2016.   Specialty:  Cardiology Why:  at 10:15 for electrophysiology consult for atrial flutter. Please arrive 15 minutes early and bring all medication with you.  Contact information: [address] [place] [address] [phone]            Discharge Medications   Current Discharge Medication List    START taking these medications   Details  apixaban (ELIQUIS) 5 MG TABS tablet Take 1 tablet (5 mg total) by mouth 2 (two) times daily. Qty: 60 tablet, Refills: 3    diltiazem (CARDIZEM CD) 240 MG 24 hr capsule Take 1 capsule (240 mg total) by mouth daily. Qty: 30 capsule, Refills: 3      CONTINUE these medications which have NOT CHANGED   Details  ADVAIR DISKUS 100-50 MCG/DOSE AEPB INHALE 1 PUFF INTO THE LUNGS TWICE DAILY Qty: 60 each, Refills: 5    albuterol (PROVENTIL HFA;VENTOLIN HFA) 108 (90 BASE) MCG/ACT inhaler Inhale 2 puffs into the lungs every 4 (four) hours as needed for wheezing or shortness of breath. Qty: 1 Inhaler, Refills: 6    amitriptyline (ELAVIL) 75 MG  tablet Take 2 tablets at bedtime    Ascorbic Acid (VITAMIN C) 500 MG tablet Take 500 mg by mouth daily.      aspirin 81 MG tablet Take 81 mg by mouth daily.    atorvastatin (LIPITOR) 80 MG tablet TAKE 1 TABLET(80 MG) BY MOUTH DAILY Qty: 90 tablet, Refills: 0    B-D TB SYRINGE 1CC/27GX1/2" 27G X 1/2" 1 ML MISC Reported on 02/16/2016    Carbonyl Iron (PERFECT IRON PO) Take 1 tablet by mouth every morning.     clobetasol cream (TEMOVATE) 0.05 % Apply 1 application topically 2 (two) times daily.    folic acid (FOLVITE) 800 MCG tablet Take 800 mcg by mouth daily.    furosemide (LASIX) 20 MG tablet TAKE 2 TABLETS BY MOUTH EVERY MORNING AND TAKE 1 TABLET EVERY EVENING AT 4:00PM Qty: 270 tablet, Refills: 3    Lancets (  ONETOUCH ULTRASOFT) lancets USE TO TEST BLOOD SUGAR NO MORE THAN TWICE DAILY Qty: 100 each, Refills: 12    Loteprednol Etabonate (LOTEMAX) 0.5 % GEL Place 1 drop into both eyes every morning. daily    Methotrexate Sodium, PF, 50 MG/2ML SOLN Inject 25 mg into the skin once a week.     metoprolol tartrate (LOPRESSOR) 25 MG tablet TAKE 1 TABLET(25 MG) BY MOUTH TWICE DAILY Qty: 180 tablet, Refills: 3    Misc. Devices (ACAPELLA) MISC Use as directed Qty: 1 each, Refills: 0    nitroGLYCERIN (NITROSTAT) 0.4 MG SL tablet Place 1 tablet (0.4 mg total) under the tongue every 5 (five) minutes as needed for chest pain. Qty: 25 tablet, Refills: 1    NON FORMULARY EVERGO PORTABLE PULSE DOSE OXYGEN oxygen 3L per nasal cannula 24 hours a day Dx: 496    ONE TOUCH ULTRA TEST test strip USE TO TEST BLOOD SUGAR NO MORE THAN TWICE DAILY Qty: 100 each, Refills: 12    oxyCODONE-acetaminophen (PERCOCET) 7.5-325 MG per tablet Take 1 tablet by mouth 3 (three) times daily. 3 times a day    pantoprazole (PROTONIX) 40 MG tablet Take 1 tablet (40 mg total) by mouth 2 (two) times daily. Qty: 180 tablet, Refills: 3    potassium chloride (K-DUR,KLOR-CON) 10 MEQ tablet Take two (4) tablets (40 meq  total) by mouth every morning and take one (2) tablet (20 meq total) by mouth every evening. Qty: 180 tablet, Refills: 11    predniSONE (DELTASONE) 5 MG tablet Take 5 mg by mouth daily. Take as directed    Respiratory Therapy Supplies (FLUTTER) DEVI 1 Inhaler by Does not apply route 2 (two) times daily. Qty: 1 each, Refills: 0    tiotropium (SPIRIVA HANDIHALER) 18 MCG inhalation capsule INHALE 1 CAPSULE BY MOUTH IN HANDIHALER DAILY Qty: 30 capsule, Refills: 5    vitamin B-12 (CYANOCOBALAMIN) 250 MCG tablet Take 250 mcg by mouth daily.        STOP taking these medications     levofloxacin (LEVAQUIN) 750 MG tablet            Outstanding Labs/Studies   None  Duration of Discharge Encounter   Greater than 30 minutes including physician time.  Signed, Berton Bon NP 10/03/2016, 5:22 PM  Attending Note:   The patient was seen and examined.  Agree with assessment and plan as noted above.  Changes made to the above note as needed.  Patient seen and independently examined with Lizabeth Leyden, NP.   We discussed all aspects of the encounter. I agree with the assessment and plan as stated above.  1. Atrial flutter:   Ventricular rate is now well controlled.  He is feeling well Will DC to home on current meds, and Eliquis.   Will see him in follow up    I have spent a total of 40 minutes with patient reviewing hospital  notes , telemetry, EKGs, labs and examining patient as well as establishing an assessment and plan that was discussed with the patient. > 50% of time was spent in direct patient care.    Vesta Mixer, Montez Hageman., MD, [facility] 10/06/2016, 4:30 PM [address]  [address] Office [phone] Pager [phone]

## 2016-10-03 NOTE — Progress Notes (Signed)
  Echocardiogram 2D Echocardiogram has been performed.  Leta Jungling M 10/03/2016, 3:19 PM

## 2016-10-03 NOTE — Discharge Instructions (Addendum)
Atrial Flutter Atrial flutter is a type of abnormal heart rhythm (arrhythmia). In atrial flutter, the heartbeat is fast but regular. There are two types of atrial flutter:  Paroxysmal atrial flutter. This type starts suddenly. It usually stops on its own soon after it starts.  Permanent atrial flutter. This type does not go away. What are the causes? This condition may be caused by:  A heart condition or problem, such as:  A heart attack.  Heart failure.  A heart valve problem.  A lung problem, such as:  A blood clot in the lungs (pulmonary embolism, or PE).  Chronic obstructive pulmonary disease.  Poorly controlled high blood pressure (hypertension).  Hyperthyroidism.  Caffeine.  Some decongestant cold medicines.  Low levels of minerals called electrolytes in the blood.  Cocaine. What increases the risk? This condition is more likely to develop in:  Elderly adults.  Men. What are the signs or symptoms? Symptoms of this condition include:  A feeling that your heart is pounding or racing (palpitations).  Shortness of breath.  Chest pain.  Feeling light-headed.  Dizziness.  Fainting. How is this diagnosed? This condition may be diagnosed with tests, including:  An electrocardiogram (ECG). This is a painless test that records electrical signals in the heart.  Holter monitoring. For this test, you wear a device that records your heartbeat for 1-2 days.  Cardiac event monitoring. For this test, you wear a device that records your heartbeat for up to 30 days.  An echocardiogram. This is a painless test that uses sound waves to make a picture of your heart.  Stress test. This test records your heartbeat while you exercise.  Blood tests. How is this treated? This condition may be treated with:  Treatment of any underlying conditions.  Medicine to make your heart beat more slowly.  Medicine to keep the condition from coming back.  A procedure to  keep the condition under control. Some procedures to do this include:  Cardioversion. During this procedure, medicines or an electrical shock are given to make the heart beat normally.  Ablation. During this procedure, the heart tissue that is causing the problem is destroyed. This procedure may be done if atrial flutter lasts a long time or happens often. Follow these instructions at home:  Take over-the-counter and prescription medicines only as told by your health care provider.  Do not take any new medicines without talking to your health care provider.  Do not use tobacco products, including cigarettes, chewing tobacco, or e-cigarettes. If you need help quitting, ask your health care provider.  Limit alcohol intake to no more than 1 drink per day for nonpregnant women and 2 drinks per day for men. One drink equals 12 oz of beer, 5 oz of wine, or 1 oz of hard liquor.  Try to reduce any stress. Stress can make your symptoms worse. Contact a health care provider if:  Your symptoms get worse. Get help right away if:  You are dizzy.  You feel like fainting or you faint.  You have shortness of breath.  You feel pain or pressure in your chest.  You suddenly feel nauseous or you suddenly vomit.  There is a sudden change in your ability to speak, eat, or move.  You are sweating a lot for no reason. This information is not intended to replace advice given to you by your health care provider. Make sure you discuss any questions you have with your health care provider. Document Released: 12/29/2008 Document  Revised: 12/20/2015 Document Reviewed: 02/24/2015 Elsevier Interactive Patient Education  2017 [employer]. Information on my medicine - ELIQUIS (apixaban)  This medication education was reviewed with me or my healthcare representative as part of my discharge preparation.  The pharmacist that spoke with me during my hospital stay was:  Dennie Fetters, [facility]  Why was  Eliquis prescribed for you? Eliquis was prescribed for you to reduce the risk of a blood clot forming that can cause a stroke if you have a medical condition called atrial fibrillation (a type of irregular heartbeat).  What do You need to know about Eliquis ? Take your Eliquis TWICE DAILY - one tablet in the morning and one tablet in the evening with or without food. If you have difficulty swallowing the tablet whole please discuss with your pharmacist how to take the medication safely.  Take Eliquis exactly as prescribed by your doctor and DO NOT stop taking Eliquis without talking to the doctor who prescribed the medication.  Stopping may increase your risk of developing a stroke.  Refill your prescription before you run out.  After discharge, you should have regular check-up appointments with your healthcare provider that is prescribing your Eliquis.  In the future your dose may need to be changed if your kidney function or weight changes by a significant amount or as you get older.  What do you do if you miss a dose? If you miss a dose, take it as soon as you remember on the same day and resume taking twice daily.  Do not take more than one dose of ELIQUIS at the same time to make up a missed dose.  Important Safety Information A possible side effect of Eliquis is bleeding. You should call your healthcare provider right away if you experience any of the following: ? Bleeding from an injury or your nose that does not stop. ? Unusual colored urine (red or dark brown) or unusual colored stools (red or black). ? Unusual bruising for unknown reasons. ? A serious fall or if you hit your head (even if there is no bleeding).  Some medicines may interact with Eliquis and might increase your risk of bleeding or clotting while on Eliquis. To help avoid this, consult your healthcare provider or pharmacist prior to using any new prescription or non-prescription medications, including herbals,  vitamins, non-steroidal anti-inflammatory drugs (NSAIDs) and supplements.  This website has more information on Eliquis (apixaban): http://www.eliquis.com/eliquis/home

## 2016-10-04 ENCOUNTER — Telehealth: Payer: Self-pay | Admitting: Cardiology

## 2016-10-04 MED ORDER — NITROGLYCERIN 0.4 MG SL SUBL: 0.4000 mg | SUBLINGUAL_TABLET | SUBLINGUAL | 2 refills | Status: AC | PRN

## 2016-10-04 NOTE — Telephone Encounter (Signed)
New Message     *STAT* If patient is at the pharmacy, call can be transferred to refill team.   1. Which medications need to be refilled? (please list name of each medication and dose if known) nitroGLYCERIN (NITROSTAT) 0.4 MG SL tablet  2. Which pharmacy/location (including street and city if local pharmacy) is medication to be sent to?[id number] - [place], [place] - [address]  3. Do they need a 30 day or 90 day supply? 30 day

## 2016-10-13 NOTE — Progress Notes (Addendum)
HPI The patient presents follow up of CAD with bypass in 2007.   In May of last year he was admitted with chest pain and possible pericarditis.   Cardiac cath on 01/23/16 showed occluded saphenous vein graft to diagonal, patent free left radial artery to the obtuse marginal, patent SVG to distal RCA of left Cx, widely patent LIMA to distal LAD, severe native vessel disease with total occlusion of several large diagonals. High-grade obstruction in nondominant RCA and high grade obstruction of proximal and mid LAD with total occlusion distally. Recommendations were for continued medical therapy.  Since I last saw him he was in the hospital with atrial flutter and rapid rate.   He was treated with rate control and Eliquis.    He comes back today for follow up.  He actually feels well fibrillation. He doesn't have any new shortness of breath, PND or orthopnea. He's not having any palpitations, presyncope or syncope. He is tolerating his Eliquis. He's had no bleeding issues.  He denies any presyncope or syncope. He's had no chest pressure, neck or arm discomfort.  No Known Allergies  Current Outpatient Prescriptions  Medication Sig Dispense Refill  . ADVAIR DISKUS 100-50 MCG/DOSE AEPB INHALE 1 PUFF INTO THE LUNGS TWICE DAILY 60 each 5  . albuterol (PROVENTIL HFA;VENTOLIN HFA) 108 (90 BASE) MCG/ACT inhaler Inhale 2 puffs into the lungs every 4 (four) hours as needed for wheezing or shortness of breath. 1 Inhaler 6  . amitriptyline (ELAVIL) 75 MG tablet Take 2 tablets at bedtime    . apixaban (ELIQUIS) 5 MG TABS tablet Take 1 tablet (5 mg total) by mouth 2 (two) times daily. 60 tablet 3  . Ascorbic Acid (VITAMIN C) 500 MG tablet Take 500 mg by mouth daily.      Marland Kitchen atorvastatin (LIPITOR) 80 MG tablet TAKE 1 TABLET(80 MG) BY MOUTH DAILY 90 tablet 0  . B-D TB SYRINGE 1CC/27GX1/2" 27G X 1/2" 1 ML MISC Reported on 02/16/2016    . Carbonyl Iron (PERFECT IRON PO) Take 1 tablet by mouth every morning.     .  clobetasol cream (TEMOVATE) 0.05 % Apply 1 application topically 2 (two) times daily.    Marland Kitchen diltiazem (CARDIZEM CD) 240 MG 24 hr capsule Take 1 capsule (240 mg total) by mouth daily. 30 capsule 3  . folic acid (FOLVITE) 800 MCG tablet Take 800 mcg by mouth daily.    . furosemide (LASIX) 20 MG tablet TAKE 2 TABLETS BY MOUTH EVERY MORNING AND TAKE 1 TABLET EVERY EVENING AT 4:00PM 270 tablet 3  . Lancets (ONETOUCH ULTRASOFT) lancets USE TO TEST BLOOD SUGAR NO MORE THAN TWICE DAILY 100 each 12  . Loteprednol Etabonate (LOTEMAX) 0.5 % GEL Place 1 drop into both eyes every morning. daily    . Methotrexate Sodium, PF, 50 MG/2ML SOLN Inject 25 mg into the skin once a week.     . metoprolol tartrate (LOPRESSOR) 25 MG tablet TAKE 1 TABLET(25 MG) BY MOUTH TWICE DAILY 180 tablet 3  . Misc. Devices (ACAPELLA) MISC Use as directed 1 each 0  . nitroGLYCERIN (NITROSTAT) 0.4 MG SL tablet Place 1 tablet (0.4 mg total) under the tongue every 5 (five) minutes as needed for chest pain (up to 3 doses for severe chest pain. If no relief after 3rd dose, proceed to the ED for an evaluation). 25 tablet 2  . NON FORMULARY [place] PORTABLE PULSE DOSE OXYGEN oxygen 3L per nasal cannula 24 hours a day Dx: 496    .  ONE TOUCH ULTRA TEST test strip USE TO TEST BLOOD SUGAR NO MORE THAN TWICE DAILY 100 each 12  . oxyCODONE-acetaminophen (PERCOCET) 7.5-325 MG per tablet Take 1 tablet by mouth 3 (three) times daily. 3 times a day    . pantoprazole (PROTONIX) 40 MG tablet Take 1 tablet (40 mg total) by mouth 2 (two) times daily. 180 tablet 3  . potassium chloride (K-DUR,KLOR-CON) 10 MEQ tablet Take two (4) tablets (40 meq total) by mouth every morning and take one (2) tablet (20 meq total) by mouth every evening. 180 tablet 11  . predniSONE (DELTASONE) 5 MG tablet Take 5 mg by mouth daily. Take as directed    . Respiratory Therapy Supplies (FLUTTER) DEVI 1 Inhaler by Does not apply route 2 (two) times daily. 1 each 0  . tiotropium  (SPIRIVA HANDIHALER) 18 MCG inhalation capsule INHALE 1 CAPSULE BY MOUTH IN HANDIHALER DAILY 30 capsule 5  . vitamin B-12 (CYANOCOBALAMIN) 250 MCG tablet Take 250 mcg by mouth daily.       No current facility-administered medications for this visit.     Past Medical History:  Diagnosis Date  . CAD (coronary artery disease)    MI 2007, CABG  . Cataracts, bilateral   . Complication of anesthesia    DIFFICULTY AFTER CABG HAD CONFUSION POST OP  . COPD (chronic obstructive pulmonary disease) (HCC)   . Diabetes mellitus    Dx 07-2008 A1C 6.2  . Esophageal dysmotility   . Fatty liver 10/11/09  . Gastroesophageal reflux disease   . Gastroparesis   . Hiatal hernia   . Hypertension   . Neuropathy (HCC)    (related to RA per neuro note 05-2011)  . Onychomycosis   . Osteoporosis    per DEXA 05-2008  . Oxygen dependent    3l  . Peripheral polyneuropathy (HCC)    Severe, causing problems with his feet, see surgeries  . Psoriasis   . Rheumatoid arthritis [facility])    [facility] Rheumatology    Past Surgical History:  Procedure Laterality Date  . achiles tendon surgery  8/09  . CARDIAC CATHETERIZATION N/A 01/23/2016   Procedure: Left Heart Cath and Cors/Grafts Angiography;  Surgeon: Lyn Records, MD;  Location: [facility] INVASIVE CV LAB;  Service: Cardiovascular;  Laterality: N/A;  . CHOLECYSTECTOMY    . CORONARY ARTERY BYPASS GRAFT  2007   (LIMA to the LAD, left radial to obtuse marginal, SVG to first diagonal, SVG to PDA. His ejection fraction to 50-55%  . ESOPHAGOGASTRODUODENOSCOPY N/A 10/19/2012   Procedure: ESOPHAGOGASTRODUODENOSCOPY (EGD);  Surgeon: Hart Carwin, MD;  Location: [facility] ENDOSCOPY;  Service: Endoscopy;  Laterality: N/A;  the dilatation is possible.   Marland Kitchen FOOT SURGERY     to tendon release and repair of osteomyelitis   . LARYNGOSCOPY  01/21/2014    DR BATES   . LARYNGOSCOPY N/A 01/21/2014   Procedure: LARYNGOSCOPY;  Surgeon: Christia Reading, MD;  Location: [facility] OR;  Service: ENT;  Laterality:  N/A;  micro direct laryngoscopy with prolaryn injection/jet venturi ventilation  . TOE AMPUTATION  4-12/ 3 14   d/t a deformity, found to have osteomyelitis, complicated by post-op foot strtess FX   ROS   As stated in the HPI and negative for all other systems.   PHYSICAL EXAM BP 127/79   Pulse (!) 59   Ht 6' (1.829 m)   Wt 227 lb 6.4 oz (103.1 kg)   BMI 30.84 kg/m  General: Well developed, well nourished, in no acute distress.  Head: normocephalic and atraumatic  Mouth: Teeth, gums and palate normal. Oral mucosa normal.  Neck: Neck supple, no JVD. No masses, thyromegaly or abnormal cervical nodes.  Chest Wall: well-healed sternotomy scar  Lungs: Clear bilaterally to auscultation and percussion.  Heart: S1 and S2 within normal limits, no S3, no S4, no clicks, no rubs, no murmurs  Abdomen: Bowel sounds positive; abdomen soft and non-tender without masses, organomegaly, or hernias noted. No hepatosplenomegaly, obese  Msk: severe rheumatoid changes  Extremities: trace bilateral edema, his legs are in diabetic boots and I was unable to appreciate posterior tibialis or dorsalis pulses   EKG:  Atrial flutter, rate 81, axis within normal limits, variable conduction, poor anterior R wave progression, no acute ST-T wave changes.  Lab Results  Component Value Date   CHOL 135 05/24/2016   TRIG 111.0 05/24/2016   HDL 35.40 (L) 05/24/2016   LDLCALC 77 05/24/2016    Lab Results  Component Value Date   HGBA1C 6.3 05/24/2016    ASSESSMENT AND PLAN  ATRIAL FLUTTER:  [name] has a CHA2DS2 - VASc score of 4 with a risk of stroke of 4%.  We discussed the various ways we might manage this. As this is the first finding of this I would not send him directly for flutter ablation although he does have a typical pattern. I would suggest cardioversion and seeing if this returns. He does have some fatigue and decreased stamina which could be related to the flutter. We'll see if this improves  after cardioversion. He can stop his aspirin.  CAD:  The patient has no new sypmtoms.  No further cardiovascular testing is indicated.  We will continue with aggressive risk reduction and meds as listed. He had had no new symptoms since his cardiac cath.   HTN:  His blood pressure is well controlled. He will continue the meds as listed.  DYSLIPIDEMIA:  His lipid profile was excellent last year.  No change in therapy is indicated.  DM:  Per Willow Ora, MD   The patient has presented for DCCV.  The history is as above.

## 2016-10-14 ENCOUNTER — Encounter: Payer: Self-pay | Admitting: Cardiology

## 2016-10-14 ENCOUNTER — Ambulatory Visit (INDEPENDENT_AMBULATORY_CARE_PROVIDER_SITE_OTHER): Payer: BC Managed Care – PPO | Admitting: Cardiology

## 2016-10-14 VITALS — BP 127/79 | HR 59 | Ht 72.0 in | Wt 227.4 lb

## 2016-10-14 DIAGNOSIS — D689 Coagulation defect, unspecified: Secondary | ICD-10-CM

## 2016-10-14 DIAGNOSIS — R5383 Other fatigue: Secondary | ICD-10-CM | POA: Diagnosis not present

## 2016-10-14 DIAGNOSIS — Z01812 Encounter for preprocedural laboratory examination: Secondary | ICD-10-CM

## 2016-10-14 DIAGNOSIS — I4892 Unspecified atrial flutter: Secondary | ICD-10-CM | POA: Diagnosis not present

## 2016-10-14 LAB — BASIC METABOLIC PANEL
BUN: 15 mg/dL (ref 7–25)
CHLORIDE: 103 mmol/L (ref 98–110)
CO2: 26 mmol/L (ref 20–31)
CREATININE: 0.74 mg/dL (ref 0.70–1.25)
Calcium: 9.6 mg/dL (ref 8.6–10.3)
Glucose, Bld: 169 mg/dL — ABNORMAL HIGH (ref 65–99)
Potassium: 3.8 mmol/L (ref 3.5–5.3)
Sodium: 138 mmol/L (ref 135–146)

## 2016-10-14 LAB — TSH: TSH: 2.44 mIU/L (ref 0.40–4.50)

## 2016-10-14 LAB — CBC
HEMATOCRIT: 40.9 % (ref 38.5–50.0)
Hemoglobin: 13.4 g/dL (ref 13.2–17.1)
MCH: 29.5 pg (ref 27.0–33.0)
MCHC: 32.8 g/dL (ref 32.0–36.0)
MCV: 90.1 fL (ref 80.0–100.0)
MPV: 9.8 fL (ref 7.5–12.5)
PLATELETS: 165 10*3/uL (ref 140–400)
RBC: 4.54 MIL/uL (ref 4.20–5.80)
RDW: 16.1 % — AB (ref 11.0–15.0)
WBC: 4.4 10*3/uL (ref 3.8–10.8)

## 2016-10-14 NOTE — Patient Instructions (Signed)
Medication Instructions: STOP- Aspirin  Labwork: Pre Op Labs  Testing/Procedures: Your physician has recommended that you have a Cardioversion (DCCV). Electrical Cardioversion uses a jolt of electricity to your heart either through paddles or wired patches attached to your chest. This is a controlled, usually prescheduled, procedure. Defibrillation is done under light anesthesia in the hospital, and you usually go home the day of the procedure. This is done to get your heart back into a normal rhythm. You are not awake for the procedure. Please see the instruction sheet given to you today.   Follow-Up: Your physician recommends that you schedule a follow-up appointment in: After Cardioversion   Any Other Special Instructions Will Be Listed Below (If Applicable).   If you need a refill on your cardiac medications before your next appointment, please call your pharmacy.

## 2016-10-15 LAB — APTT: aPTT: 27 s (ref 22–34)

## 2016-10-15 LAB — PROTIME-INR
INR: 1.1
PROTHROMBIN TIME: 11.2 s (ref 9.0–11.5)

## 2016-10-18 ENCOUNTER — Other Ambulatory Visit: Payer: Self-pay | Admitting: Cardiology

## 2016-10-18 NOTE — Addendum Note (Signed)
Addended by: Barrie Dunker on: 10/18/2016 10:58 AM   Modules accepted: Orders

## 2016-10-21 ENCOUNTER — Telehealth: Payer: Self-pay | Admitting: Cardiology

## 2016-10-21 NOTE — Telephone Encounter (Signed)
Rx(s) sent to pharmacy electronically.  

## 2016-10-21 NOTE — Telephone Encounter (Signed)
Spoke with pt states that he is having cardioversion on 10-23-16 and he states that Dr Antoine Poche told him not to take any medications before procedure except to take his Eliquis.informed pt that we usually tell pt's to fp;;pw cardioversion letter directions before procedure. I informed pt I did not see this direction in last OV note but this was ok, but I would forward to Dr Antoine Poche for further direction. Please advise

## 2016-10-21 NOTE — Telephone Encounter (Signed)
New message   Pt is having a carioversion done on Wednesday by Dr. Antoine Poche and Dr. Antoine Poche advised him to stop several medications however he is concerned and wondering whether he needed to stop diltiazem (CARDIZEM CD) 240 MG 24 hr capsule too or continue taking it. Would like a call back before Wednesday.

## 2016-10-23 ENCOUNTER — Ambulatory Visit (HOSPITAL_COMMUNITY): Payer: BC Managed Care – PPO | Admitting: Certified Registered Nurse Anesthetist

## 2016-10-23 ENCOUNTER — Ambulatory Visit (HOSPITAL_COMMUNITY)
Admission: RE | Admit: 2016-10-23 | Discharge: 2016-10-23 | Disposition: A | Discharge status: Home / Self Care | Payer: BC Managed Care – PPO | Source: Ambulatory Visit | Attending: Cardiology | Admitting: Cardiology

## 2016-10-23 ENCOUNTER — Encounter (HOSPITAL_COMMUNITY)
Admission: RE | Disposition: A | Discharge status: Home / Self Care | Payer: Self-pay | Source: Ambulatory Visit | Attending: Cardiology

## 2016-10-23 ENCOUNTER — Encounter (HOSPITAL_COMMUNITY): Payer: Self-pay | Admitting: *Deleted

## 2016-10-23 DIAGNOSIS — Z87891 Personal history of nicotine dependence: Secondary | ICD-10-CM | POA: Diagnosis not present

## 2016-10-23 DIAGNOSIS — I10 Essential (primary) hypertension: Secondary | ICD-10-CM | POA: Insufficient documentation

## 2016-10-23 DIAGNOSIS — K219 Gastro-esophageal reflux disease without esophagitis: Secondary | ICD-10-CM | POA: Diagnosis not present

## 2016-10-23 DIAGNOSIS — I4892 Unspecified atrial flutter: Secondary | ICD-10-CM

## 2016-10-23 DIAGNOSIS — M1990 Unspecified osteoarthritis, unspecified site: Secondary | ICD-10-CM | POA: Insufficient documentation

## 2016-10-23 DIAGNOSIS — E1151 Type 2 diabetes mellitus with diabetic peripheral angiopathy without gangrene: Secondary | ICD-10-CM | POA: Insufficient documentation

## 2016-10-23 DIAGNOSIS — J449 Chronic obstructive pulmonary disease, unspecified: Secondary | ICD-10-CM | POA: Diagnosis not present

## 2016-10-23 DIAGNOSIS — I2510 Atherosclerotic heart disease of native coronary artery without angina pectoris: Secondary | ICD-10-CM | POA: Insufficient documentation

## 2016-10-23 HISTORY — PX: CARDIOVERSION: SHX1299

## 2016-10-23 LAB — GLUCOSE, CAPILLARY: Glucose-Capillary: 128 mg/dL — ABNORMAL HIGH (ref 65–99)

## 2016-10-23 SURGERY — CARDIOVERSION
Anesthesia: General

## 2016-10-23 MED ORDER — SODIUM CHLORIDE 0.9 % IV SOLN: 250.0000 mL | INTRAVENOUS | Status: DC

## 2016-10-23 MED ORDER — SODIUM CHLORIDE 0.9 % IV SOLN
INTRAVENOUS | Status: DC
  Administered 2016-10-23: 14:00:00 via INTRAVENOUS

## 2016-10-23 MED ORDER — HYDROCORTISONE 1 % EX CREA
1.0000 "application " | TOPICAL_CREAM | Freq: Three times a day (TID) | CUTANEOUS | Status: DC | PRN
  Filled 2016-10-23: qty 28

## 2016-10-23 MED ORDER — SODIUM CHLORIDE 0.9% FLUSH: 3.0000 mL | INTRAVENOUS | Status: DC | PRN

## 2016-10-23 MED ORDER — SODIUM CHLORIDE 0.9% FLUSH: 3.0000 mL | Freq: Two times a day (BID) | INTRAVENOUS | Status: DC

## 2016-10-23 MED ORDER — LIDOCAINE 2% (20 MG/ML) 5 ML SYRINGE
INTRAMUSCULAR | Status: DC | PRN
  Administered 2016-10-23: 30 mg via INTRAVENOUS

## 2016-10-23 MED ORDER — PROPOFOL 10 MG/ML IV BOLUS
INTRAVENOUS | Status: DC | PRN
  Administered 2016-10-23: 10 mg via INTRAVENOUS
  Administered 2016-10-23: 70 mg via INTRAVENOUS
  Administered 2016-10-23: 20 mg via INTRAVENOUS

## 2016-10-23 MED ORDER — HYDROCORTISONE 1 % EX CREA: 1.0000 "application " | TOPICAL_CREAM | Freq: Three times a day (TID) | CUTANEOUS | 0 refills | Status: DC | PRN

## 2016-10-23 NOTE — Anesthesia Postprocedure Evaluation (Signed)
Anesthesia Post Note  Patient: Joseph [name]  Procedure(s) Performed: Procedure(s) (LRB): CARDIOVERSION (N/A)  Patient location during evaluation: Endoscopy Anesthesia Type: General Level of consciousness: awake Pain management: pain level controlled Vital Signs Assessment: post-procedure vital signs reviewed and stable Respiratory status: spontaneous breathing Cardiovascular status: stable Anesthetic complications: no       Last Vitals:  Vitals:   10/23/16 1427 10/23/16 1428  BP:    Pulse: (!) 102 (!) 102  Resp: 16 17  Temp:      Last Pain:  Vitals:   10/23/16 1413  TempSrc: Oral  PainSc: 0-No pain                 Latania Bascomb

## 2016-10-23 NOTE — CV Procedure (Signed)
   Procedure:   DCCV  Indication:  Symptomatic atrial flutter.  Procedure Note:  The patient signed informed consent.  He has had had therapeutic anticoagulation with Eliquis for 3 weeks.  Anesthesia was administered by Dr. Chilton Si.  Adequate airway was maintained throughout and vital followed per protocol.  He was cardioverted x 1 with 75J of biphasic synchronized energy.  He converted to NSR.  There were no apparent complications.  The patient had normal neuro status and respiratory status post procedure with vitals stable as recorded elsewhere.    Follow up:  We will arrange follow up with me in 6 weeks.  He will continue on current medical therapy.

## 2016-10-23 NOTE — Transfer of Care (Signed)
Immediate Anesthesia Transfer of Care Note  Patient: Joseph [name]  Procedure(s) Performed: Procedure(s): CARDIOVERSION (N/A)  Patient Location: Endoscopy Unit  Anesthesia Type:General  Level of Consciousness: awake, alert  and patient cooperative  Airway & Oxygen Therapy: Patient Spontanous Breathing and Patient connected to nasal cannula oxygen  Post-op Assessment: Report given to RN and Post -op Vital signs reviewed and stable  Post vital signs: Reviewed and stable  Last Vitals:  Vitals:   10/23/16 1351 10/23/16 1352  BP:  (!) 136/102  Pulse: (!) 130 98  Resp: (!) 8 (!) 22  Temp:      Last Pain:  Vitals:   10/23/16 1313  TempSrc: Oral         Complications: No apparent anesthesia complications

## 2016-10-23 NOTE — Anesthesia Postprocedure Evaluation (Signed)
Anesthesia Post Note  Patient: Joseph [name]  Procedure(s) Performed: Procedure(s) (LRB): CARDIOVERSION (N/A)  Patient location during evaluation: Endoscopy Anesthesia Type: General Level of consciousness: awake Pain management: pain level controlled Vital Signs Assessment: post-procedure vital signs reviewed and stable Respiratory status: spontaneous breathing Cardiovascular status: stable       Last Vitals:  Vitals:   10/23/16 1427 10/23/16 1428  BP:    Pulse: (!) 102 (!) 102  Resp: 16 17  Temp:      Last Pain:  Vitals:   10/23/16 1413  TempSrc: Oral  PainSc: 0-No pain                 [name]

## 2016-10-23 NOTE — Anesthesia Preprocedure Evaluation (Addendum)
Anesthesia Evaluation  Patient identified by MRN, date of birth, ID band Patient awake    History of Anesthesia Complications (+) history of anesthetic complications ("confusion after CABG post-pump")  Airway Mallampati: I  TM Distance: >3 FB Neck ROM: Full    Dental  (+) Dental Advisory Given, Lower Dentures, Upper Dentures   Pulmonary COPD, former smoker,    breath sounds clear to auscultation       Cardiovascular hypertension, + CAD and + Peripheral Vascular Disease   Rhythm:Irregular Rate:Normal     Neuro/Psych  Neuromuscular disease    GI/Hepatic hiatal hernia, GERD  ,  Endo/Other  diabetes  Renal/GU      Musculoskeletal  (+) Arthritis ,   Abdominal   Peds  Hematology   Anesthesia Other Findings   Reproductive/Obstetrics                           Anesthesia Physical Anesthesia Plan  ASA: III  Anesthesia Plan: General   Post-op Pain Management:    Induction: Intravenous  Airway Management Planned: Mask and Simple Face Mask  Additional Equipment:   Intra-op Plan:   Post-operative Plan:   Informed Consent: I have reviewed the patients History and Physical, chart, labs and discussed the procedure including the risks, benefits and alternatives for the proposed anesthesia with the patient or authorized representative who has indicated his/her understanding and acceptance.   Dental advisory given  Plan Discussed with: CRNA, Anesthesiologist and Surgeon  Anesthesia Plan Comments:         Anesthesia Quick Evaluation

## 2016-10-23 NOTE — Discharge Instructions (Signed)
Moderate Conscious Sedation, Adult °Sedation is the use of medicines to promote relaxation and relieve discomfort and anxiety. Moderate conscious sedation is a type of sedation. Under moderate conscious sedation, you are less alert than normal, but you are still able to respond to instructions, touch, or both. °Moderate conscious sedation is used during short medical and dental procedures. It is milder than deep sedation, which is a type of sedation under which you cannot be easily woken up. It is also milder than general anesthesia, which is the use of medicines to make you unconscious. Moderate conscious sedation allows you to return to your regular activities sooner. °Tell a health care provider about: °· Any allergies you have. °· All medicines you are taking, including vitamins, herbs, eye drops, creams, and over-the-counter medicines. °· Use of steroids (by mouth or creams). °· Any problems you or family members have had with sedatives and anesthetic medicines. °· Any blood disorders you have. °· Any surgeries you have had. °· Any medical conditions you have, such as sleep apnea. °· Whether you are pregnant or may be pregnant. °· Any use of cigarettes, alcohol, marijuana, or street drugs. °What are the risks? °Generally, this is a safe procedure. However, problems may occur, including: °· Getting too much medicine (oversedation). °· Nausea. °· Allergic reaction to medicines. °· Trouble breathing. If this happens, a breathing tube may be used to help with breathing. It will be removed when you are awake and breathing on your own. °· Heart trouble. °· Lung trouble. °What happens before the procedure? °Staying hydrated  °Follow instructions from your health care provider about hydration, which may include: °· Up to 2 hours before the procedure - you may continue to drink clear liquids, such as water, clear fruit juice, black coffee, and plain tea. °Eating and drinking restrictions  °Follow instructions from your  health care provider about eating and drinking, which may include: °· 8 hours before the procedure - stop eating heavy meals or foods such as meat, fried foods, or fatty foods. °· 6 hours before the procedure - stop eating light meals or foods, such as toast or cereal. °· 6 hours before the procedure - stop drinking milk or drinks that contain milk. °· 2 hours before the procedure - stop drinking clear liquids. °Medicine  ° °Ask your health care provider about: °· Changing or stopping your regular medicines. This is especially important if you are taking diabetes medicines or blood thinners. °· Taking medicines such as aspirin and ibuprofen. These medicines can thin your blood. Do not take these medicines before your procedure if your health care provider instructs you not to. °Tests and exams  °· You will have a physical exam. °· You may have blood tests done to show: °¨ How well your kidneys and liver are working. °¨ How well your blood can clot. °General instructions  °· Plan to have someone take you home from the hospital or clinic. °· If you will be going home right after the procedure, plan to have someone with you for 24 hours. °What happens during the procedure? °· An IV tube will be inserted into one of your veins. °· Medicine to help you relax (sedative) will be given through the IV tube. °· The medical or dental procedure will be performed. °What happens after the procedure? °· Your blood pressure, heart rate, breathing rate, and blood oxygen level will be monitored often until the medicines you were given have worn off. °· Do not drive for 24   hours. °This information is not intended to replace advice given to you by your health care provider. Make sure you discuss any questions you have with your health care provider. °Document Released: 05/07/2001 Document Revised: 01/16/2016 Document Reviewed: 12/02/2015 °Elsevier Interactive Patient Education © 2017 [employer] °Electrical Cardioversion, Care  After °This sheet gives you information about how to care for yourself after your procedure. Your health care provider may also give you more specific instructions. If you have problems or questions, contact your health care provider. °What can I expect after the procedure? °After the procedure, it is common to have: °· Some redness on the skin where the shocks were given. °Follow these instructions at home: °· Do not drive for 24 hours if you were given a medicine to help you relax (sedative). °· Take over-the-counter and prescription medicines only as told by your health care provider. °· Ask your health care provider how to check your pulse. Check it often. °· Rest for 48 hours after the procedure or as told by your health care provider. °· Avoid or limit your caffeine use as told by your health care provider. °Contact a health care provider if: °· You feel like your heart is beating too quickly or your pulse is not regular. °· You have a serious muscle cramp that does not go away. °Get help right away if: °· You have discomfort in your chest. °· You are dizzy or you feel faint. °· You have trouble breathing or you are short of breath. °· Your speech is slurred. °· You have trouble moving an arm or leg on one side of your body. °· Your fingers or toes turn cold or blue. °This information is not intended to replace advice given to you by your health care provider. Make sure you discuss any questions you have with your health care provider. °Document Released: 06/02/2013 Document Revised: 03/15/2016 Document Reviewed: 02/16/2016 °Elsevier Interactive Patient Education © 2017 [employer] ° °

## 2016-10-25 NOTE — Telephone Encounter (Signed)
Procedure has been completed.

## 2016-10-28 ENCOUNTER — Institutional Professional Consult (permissible substitution): Payer: BC Managed Care – PPO | Admitting: Internal Medicine

## 2016-10-30 ENCOUNTER — Other Ambulatory Visit: Payer: Self-pay | Admitting: Adult Health

## 2016-11-10 NOTE — Progress Notes (Signed)
HPI The patient presents follow up of CAD with bypass in 2007.   In May of last year he was admitted with chest pain and possible pericarditis.   Cardiac cath on 01/23/16 showed occluded saphenous vein graft to diagonal, patent free left radial artery to the obtuse marginal, patent SVG to distal RCA of left Cx, widely patent LIMA to distal LAD, severe native vessel disease with total occlusion of several large diagonals. High-grade obstruction in nondominant RCA and high grade obstruction of proximal and mid LAD with total occlusion distally. Recommendations were for continued medical therapy.  Since I last saw him he was in the hospital with atrial flutter and rapid rate.   He was treated with rate control and Eliquis.   He underwent cardioversion in Feb.  however, he went back in flutter at some point although we don't know symptomatically with. He doesn't feel that rhythm. His wife reports he does have decreased energy and has to sleep more. However, he's not having any acute problems.  He doesn't have any new shortness of breath, PND or orthopnea. He's not having any palpitations, presyncope or syncope. He is tolerating his Eliquis. He's had no bleeding issues.  He denies any presyncope or syncope. He's had no chest pressure, neck or arm discomfort.  No Known Allergies  Current Outpatient Prescriptions  Medication Sig Dispense Refill  . ADVAIR DISKUS 100-50 MCG/DOSE AEPB INHALE 1 PUFF INTO THE LUNGS TWICE DAILY 60 each 5  . albuterol (PROVENTIL HFA;VENTOLIN HFA) 108 (90 BASE) MCG/ACT inhaler Inhale 2 puffs into the lungs every 4 (four) hours as needed for wheezing or shortness of breath. 1 Inhaler 6  . amitriptyline (ELAVIL) 75 MG tablet Take 2 tablets at bedtime    . apixaban (ELIQUIS) 5 MG TABS tablet Take 1 tablet (5 mg total) by mouth 2 (two) times daily. 60 tablet 3  . Ascorbic Acid (VITAMIN C) 500 MG tablet Take 500 mg by mouth daily.      Marland Kitchen atorvastatin (LIPITOR) 80 MG tablet TAKE 1  TABLET(80 MG) BY MOUTH DAILY 90 tablet 3  . B-D TB SYRINGE 1CC/27GX1/2" 27G X 1/2" 1 ML MISC Reported on 02/16/2016    . Carbonyl Iron (PERFECT IRON PO) Take 1 tablet by mouth every morning.     . clobetasol cream (TEMOVATE) 0.05 % Apply 1 application topically 2 (two) times daily.    Marland Kitchen diltiazem (CARDIZEM CD) 240 MG 24 hr capsule Take 1 capsule (240 mg total) by mouth daily. 30 capsule 3  . folic acid (FOLVITE) 800 MCG tablet Take 800 mcg by mouth daily.    . furosemide (LASIX) 20 MG tablet TAKE 2 TABLETS BY MOUTH EVERY MORNING AND TAKE 1 TABLET EVERY EVENING AT 4:00PM 270 tablet 3  . hydrocortisone cream 1 % Apply 1 application topically 3 (three) times daily as needed for itching (skin irritation). 30 g 0  . Lancets (ONETOUCH ULTRASOFT) lancets USE TO TEST BLOOD SUGAR NO MORE THAN TWICE DAILY 100 each 12  . Loteprednol Etabonate (LOTEMAX) 0.5 % GEL Place 1 drop into both eyes every morning. daily    . Methotrexate Sodium, PF, 50 MG/2ML SOLN Inject 25 mg into the skin once a week.     . metoprolol tartrate (LOPRESSOR) 25 MG tablet TAKE 1 TABLET(25 MG) BY MOUTH TWICE DAILY 180 tablet 3  . Misc. Devices (ACAPELLA) MISC Use as directed 1 each 0  . nitroGLYCERIN (NITROSTAT) 0.4 MG SL tablet Place 1 tablet (0.4 mg total) under the  tongue every 5 (five) minutes as needed for chest pain (up to 3 doses for severe chest pain. If no relief after 3rd dose, proceed to the ED for an evaluation). 25 tablet 2  . NON FORMULARY EVERGO PORTABLE PULSE DOSE OXYGEN oxygen 3L per nasal cannula 24 hours a day Dx: 496    . ONE TOUCH ULTRA TEST test strip USE TO TEST BLOOD SUGAR NO MORE THAN TWICE DAILY 100 each 12  . oxyCODONE-acetaminophen (PERCOCET) 7.5-325 MG per tablet Take 1 tablet by mouth 3 (three) times daily. 3 times a day    . pantoprazole (PROTONIX) 40 MG tablet Take 1 tablet (40 mg total) by mouth 2 (two) times daily. 180 tablet 3  . potassium chloride (K-DUR) 10 MEQ tablet TAKE 4 TABLETS BY MOUTH EVERY  MORNING AND TAKE 2 TABLETS BY MOUTH EVERY EVENING 180 tablet 9  . predniSONE (DELTASONE) 5 MG tablet Take 5 mg by mouth daily. Take as directed    . Respiratory Therapy Supplies (FLUTTER) DEVI 1 Inhaler by Does not apply route 2 (two) times daily. 1 each 0  . SPIRIVA HANDIHALER 18 MCG inhalation capsule INHALE 1 CAPSULE BY MOUTH IN HANDIHALER DAILY 30 capsule 2  . vitamin B-12 (CYANOCOBALAMIN) 250 MCG tablet Take 250 mcg by mouth daily.       No current facility-administered medications for this visit.     Past Medical History:  Diagnosis Date  . CAD (coronary artery disease)    MI 2007, CABG  . Cataracts, bilateral   . Complication of anesthesia    DIFFICULTY AFTER CABG HAD CONFUSION POST OP  . COPD (chronic obstructive pulmonary disease) (HCC)   . Diabetes mellitus    Dx 07-2008 A1C 6.2  . Esophageal dysmotility   . Fatty liver 10/11/09  . Gastroesophageal reflux disease   . Gastroparesis   . Hiatal hernia   . Hypertension   . Neuropathy (HCC)    (related to RA per neuro note 05-2011)  . Onychomycosis   . Osteoporosis    per DEXA 05-2008  . Oxygen dependent    3l  . Peripheral polyneuropathy (HCC)    Severe, causing problems with his feet, see surgeries  . Psoriasis   . Rheumatoid arthritis [facility])    [facility] Rheumatology    Past Surgical History:  Procedure Laterality Date  . achiles tendon surgery  8/09  . CARDIAC CATHETERIZATION N/A 01/23/2016   Procedure: Left Heart Cath and Cors/Grafts Angiography;  Surgeon: Lyn Records, MD;  Location: [facility] INVASIVE CV LAB;  Service: Cardiovascular;  Laterality: N/A;  . CARDIOVERSION N/A 10/23/2016   Procedure: CARDIOVERSION;  Surgeon: Rollene Rotunda, MD;  Location: [facility] ENDOSCOPY;  Service: Cardiovascular;  Laterality: N/A;  . CHOLECYSTECTOMY    . CORONARY ARTERY BYPASS GRAFT  2007   (LIMA to the LAD, left radial to obtuse marginal, SVG to first diagonal, SVG to PDA. His ejection fraction to 50-55%  . ESOPHAGOGASTRODUODENOSCOPY N/A  10/19/2012   Procedure: ESOPHAGOGASTRODUODENOSCOPY (EGD);  Surgeon: Hart Carwin, MD;  Location: [facility] ENDOSCOPY;  Service: Endoscopy;  Laterality: N/A;  the dilatation is possible.   Marland Kitchen FOOT SURGERY     to tendon release and repair of osteomyelitis   . LARYNGOSCOPY  01/21/2014    DR BATES   . LARYNGOSCOPY N/A 01/21/2014   Procedure: LARYNGOSCOPY;  Surgeon: Christia Reading, MD;  Location: [facility] OR;  Service: ENT;  Laterality: N/A;  micro direct laryngoscopy with prolaryn injection/jet venturi ventilation  . TOE AMPUTATION  4-12/ 3  14   d/t a deformity, found to have osteomyelitis, complicated by post-op foot strtess FX   ROS      As stated in the HPI and negative for all other systems.   PHYSICAL EXAM BP 110/68   Pulse 70   Ht 6' (1.829 m)   Wt 226 lb (102.5 kg)   BMI 30.65 kg/m  General: Well developed, well nourished, in no acute distress.  Head: normocephalic and atraumatic  Mouth: Teeth, gums and palate normal. Oral mucosa normal.  Neck: Neck supple, no JVD. No masses, thyromegaly or abnormal cervical nodes.  Chest Wall: well-healed sternotomy scar  Lungs: Clear bilaterally to auscultation and percussion.  Heart: S1 and S2 within normal limits, no S3, no clicks, no rubs, no murmurs , Slightly irregular Abdomen: Bowel sounds positive; abdomen soft and non-tender without masses, organomegaly, or hernias noted. No hepatosplenomegaly, obese  Msk:  Severe rheumatoid changes  Extremities: trace bilateral edema, his legs are in diabetic boots and I was unable to appreciate posterior tibialis or dorsalis pulses   EKG:  Atrial flutter, rate 70, axis within normal limits, variable conduction, poor anterior R wave progression, no acute ST-T wave changes.  Lab Results  Component Value Date   CHOL 135 05/24/2016   TRIG 111.0 05/24/2016   HDL 35.40 (L) 05/24/2016   LDLCALC 77 05/24/2016    Lab Results  Component Value Date   HGBA1C 6.3 05/24/2016    ASSESSMENT AND PLAN  ATRIAL FLUTTER:  Mr.  Joseph [name] has a CHA2DS2 - VASc score of 4 with a risk of stroke of 4%.  He is back in flutter after cardioversion. I'm going to send him to see Dr. Ladona Ridgel to consider flutter ablation. She'll otherwise continue the meds as listed.  CAD:  The patient has no new sypmtoms.  No further cardiovascular testing is indicated.  We will continue with aggressive risk reduction and meds as listed. He had had no new symptoms since his cardiac cath.    HTN:  His blood pressure is well controlled. He will continue the meds as listed.  No change is indicated.  DYSLIPIDEMIA:  His lipid profile was excellent last year.  No change in therapy is indicated.  He will remain on current meds.    DM:  Per Willow Ora, MD  A1C was 6.3 last fall.

## 2016-11-11 ENCOUNTER — Ambulatory Visit (INDEPENDENT_AMBULATORY_CARE_PROVIDER_SITE_OTHER): Payer: BC Managed Care – PPO | Admitting: Cardiology

## 2016-11-11 ENCOUNTER — Encounter: Payer: Self-pay | Admitting: Cardiology

## 2016-11-11 VITALS — BP 110/68 | HR 70 | Ht 72.0 in | Wt 226.0 lb

## 2016-11-11 DIAGNOSIS — I2510 Atherosclerotic heart disease of native coronary artery without angina pectoris: Secondary | ICD-10-CM | POA: Diagnosis not present

## 2016-11-11 DIAGNOSIS — I4892 Unspecified atrial flutter: Secondary | ICD-10-CM | POA: Diagnosis not present

## 2016-11-11 NOTE — Patient Instructions (Signed)
Medication Instructions:  Continue current medications  Labwork: None Ordered  Testing/Procedures: None Ordered  Follow-Up: Your physician recommends that you schedule a follow-up appointment in: Dr Ladona Ridgel next available   Any Other Special Instructions Will Be Listed Below (If Applicable).   If you need a refill on your cardiac medications before your next appointment, please call your pharmacy.

## 2016-11-13 ENCOUNTER — Other Ambulatory Visit: Payer: Self-pay | Admitting: Pulmonary Disease

## 2016-11-14 ENCOUNTER — Ambulatory Visit (INDEPENDENT_AMBULATORY_CARE_PROVIDER_SITE_OTHER): Payer: BC Managed Care – PPO | Admitting: Internal Medicine

## 2016-11-14 ENCOUNTER — Encounter: Payer: Self-pay | Admitting: Internal Medicine

## 2016-11-14 VITALS — BP 128/80 | HR 69 | Temp 98.1°F | Resp 14 | Ht 72.0 in | Wt 227.4 lb

## 2016-11-14 DIAGNOSIS — I4892 Unspecified atrial flutter: Secondary | ICD-10-CM | POA: Diagnosis not present

## 2016-11-14 DIAGNOSIS — M069 Rheumatoid arthritis, unspecified: Secondary | ICD-10-CM | POA: Diagnosis not present

## 2016-11-14 DIAGNOSIS — E1140 Type 2 diabetes mellitus with diabetic neuropathy, unspecified: Secondary | ICD-10-CM | POA: Diagnosis not present

## 2016-11-14 LAB — CBC WITH DIFFERENTIAL/PLATELET
BASOS ABS: 0 10*3/uL (ref 0.0–0.1)
Basophils Relative: 0.6 % (ref 0.0–3.0)
Eosinophils Absolute: 0.1 10*3/uL (ref 0.0–0.7)
Eosinophils Relative: 1.8 % (ref 0.0–5.0)
HCT: 41 % (ref 39.0–52.0)
Hemoglobin: 13.7 g/dL (ref 13.0–17.0)
LYMPHS ABS: 1.3 10*3/uL (ref 0.7–4.0)
Lymphocytes Relative: 31.3 % (ref 12.0–46.0)
MCHC: 33.3 g/dL (ref 30.0–36.0)
MCV: 91.4 fl (ref 78.0–100.0)
MONOS PCT: 9.8 % (ref 3.0–12.0)
Monocytes Absolute: 0.4 10*3/uL (ref 0.1–1.0)
NEUTROS PCT: 56.5 % (ref 43.0–77.0)
Neutro Abs: 2.4 10*3/uL (ref 1.4–7.7)
Platelets: 157 10*3/uL (ref 150.0–400.0)
RBC: 4.49 Mil/uL (ref 4.22–5.81)
RDW: 16.6 % — ABNORMAL HIGH (ref 11.5–15.5)
WBC: 4.2 10*3/uL (ref 4.0–10.5)

## 2016-11-14 LAB — COMPREHENSIVE METABOLIC PANEL
ALK PHOS: 84 U/L (ref 39–117)
ALT: 40 U/L (ref 0–53)
AST: 30 U/L (ref 0–37)
Albumin: 3.9 g/dL (ref 3.5–5.2)
BILIRUBIN TOTAL: 0.8 mg/dL (ref 0.2–1.2)
BUN: 15 mg/dL (ref 6–23)
CALCIUM: 9.5 mg/dL (ref 8.4–10.5)
CO2: 28 mEq/L (ref 19–32)
Chloride: 104 mEq/L (ref 96–112)
Creatinine, Ser: 0.79 mg/dL (ref 0.40–1.50)
GFR: 104.69 mL/min (ref >60.00)
GLUCOSE: 152 mg/dL — AB (ref 70–99)
Potassium: 4.1 mEq/L (ref 3.5–5.1)
Sodium: 138 mEq/L (ref 135–145)
Total Protein: 6.9 g/dL (ref 6.0–8.3)

## 2016-11-14 LAB — MICROALBUMIN / CREATININE URINE RATIO
CREATININE, U: 48.3 mg/dL
MICROALB/CREAT RATIO: 1.4 mg/g (ref 0.0–30.0)
Microalb, Ur: <0.7 mg/dL (ref 0.0–1.9)

## 2016-11-14 LAB — HEMOGLOBIN A1C: HEMOGLOBIN A1C: 6.5 % (ref 4.6–6.5)

## 2016-11-14 NOTE — Patient Instructions (Signed)
GO TO THE LAB : Get the blood work     GO TO THE FRONT DESK Schedule your next appointment for a   routine checkup in 4 months 

## 2016-11-14 NOTE — Progress Notes (Signed)
Subjective:    Patient ID: Joseph [name], male    DOB: 1950-12-20, 66 y.o.   MRN: [id number]  DOS:  11/14/2016 Type of visit - description :  rov Interval history: Since the last visit, he was admitted to hospital with new onset A. fib flutter, had a cardioversion, it worked temporarily. Records from the hospital and a cardiology note reviewed. Tolerating medications  well. Rheumatoid arthritis: Due for labs, will fax them to his rheumatologist DM Diet control, due for A1c.  Review of Systems Currently on anticoagulants, denies any blood in the urine or stools. No chest pain or difficulty breathing No cough or sputum production.  Past Medical History:  Diagnosis Date  . CAD (coronary artery disease)    MI 2007, CABG  . Cataracts, bilateral   . Complication of anesthesia    DIFFICULTY AFTER CABG HAD CONFUSION POST OP  . COPD (chronic obstructive pulmonary disease) (HCC)   . Diabetes mellitus    Dx 07-2008 A1C 6.2  . Esophageal dysmotility   . Fatty liver 10/11/09  . Gastroesophageal reflux disease   . Gastroparesis   . Hiatal hernia   . Hypertension   . Neuropathy (HCC)    (related to RA per neuro note 05-2011)  . Onychomycosis   . Osteoporosis    per DEXA 05-2008  . Oxygen dependent    3l  . Peripheral polyneuropathy (HCC)    Severe, causing problems with his feet, see surgeries  . Psoriasis   . Rheumatoid arthritis [facility])    [facility] Rheumatology    Past Surgical History:  Procedure Laterality Date  . achiles tendon surgery  8/09  . CARDIAC CATHETERIZATION N/A 01/23/2016   Procedure: Left Heart Cath and Cors/Grafts Angiography;  Surgeon: Lyn Records, MD;  Location: [facility] INVASIVE CV LAB;  Service: Cardiovascular;  Laterality: N/A;  . CARDIOVERSION N/A 10/23/2016   Procedure: CARDIOVERSION;  Surgeon: Rollene Rotunda, MD;  Location: [facility] ENDOSCOPY;  Service: Cardiovascular;  Laterality: N/A;  . CHOLECYSTECTOMY    . CORONARY ARTERY BYPASS GRAFT  2007   (LIMA to the LAD,  left radial to obtuse marginal, SVG to first diagonal, SVG to PDA. His ejection fraction to 50-55%  . ESOPHAGOGASTRODUODENOSCOPY N/A 10/19/2012   Procedure: ESOPHAGOGASTRODUODENOSCOPY (EGD);  Surgeon: Hart Carwin, MD;  Location: [facility] ENDOSCOPY;  Service: Endoscopy;  Laterality: N/A;  the dilatation is possible.   Marland Kitchen FOOT SURGERY     to tendon release and repair of osteomyelitis   . LARYNGOSCOPY  01/21/2014    DR BATES   . LARYNGOSCOPY N/A 01/21/2014   Procedure: LARYNGOSCOPY;  Surgeon: Christia Reading, MD;  Location: [facility] OR;  Service: ENT;  Laterality: N/A;  micro direct laryngoscopy with prolaryn injection/jet venturi ventilation  . TOE AMPUTATION  4-12/ 3 14   d/t a deformity, found to have osteomyelitis, complicated by post-op foot strtess FX    Social History   Social History  . Marital status: Married    Spouse name: N/A  . Number of children: 0  . Years of education: N/A   Occupational History  . Disabled-retired Unemployed   Social History Main Topics  . Smoking status: Former Smoker    Packs/day: 1.00    Years: 40.00    Quit date: 08/26/2005  . Smokeless tobacco: Former Neurosurgeon    Quit date: 04/02/2006     Comment: smoked x 40 years, 1 ppd  . Alcohol use No  . Drug use: No  . Sexual activity: Not on file  Other Topics Concern  . Not on file   Social History Narrative  . No narrative on file      Allergies as of 11/14/2016   No Known Allergies     Medication List       Accurate as of 11/14/16 11:59 PM. Always use your most recent med list.          ACAPELLA Misc Use as directed   ADVAIR DISKUS 100-50 MCG/DOSE Aepb Generic drug:  Fluticasone-Salmeterol INHALE 1 PUFF INTO THE LUNGS TWICE DAILY   amitriptyline 75 MG tablet Commonly known as:  ELAVIL Take 2 tablets at bedtime   apixaban 5 MG Tabs tablet Commonly known as:  ELIQUIS Take 1 tablet (5 mg total) by mouth 2 (two) times daily.   atorvastatin 80 MG tablet Commonly known as:  LIPITOR TAKE 1  TABLET(80 MG) BY MOUTH DAILY   B-D TB SYRINGE 1CC/27GX1/2" 27G X 1/2" 1 ML Misc Generic drug:  TUBERCULIN SYR 1CC/27GX1/2" Reported on 02/16/2016   clobetasol cream 0.05 % Commonly known as:  TEMOVATE Apply 1 application topically 2 (two) times daily.   diltiazem 240 MG 24 hr capsule Commonly known as:  CARDIZEM CD Take 1 capsule (240 mg total) by mouth daily.   FLUTTER Devi 1 Inhaler by Does not apply route 2 (two) times daily.   folic acid 800 MCG tablet Commonly known as:  FOLVITE Take 800 mcg by mouth daily.   furosemide 20 MG tablet Commonly known as:  LASIX TAKE 2 TABLETS BY MOUTH EVERY MORNING AND TAKE 1 TABLET EVERY EVENING AT 4:00PM   hydrocortisone cream 1 % Apply 1 application topically 3 (three) times daily as needed for itching (skin irritation).   LOTEMAX 0.5 % Gel Generic drug:  Loteprednol Etabonate Place 1 drop into both eyes every morning. daily   methotrexate (PF) 50 MG/2ML injection Inject 25 mg into the skin once a week.   metoprolol tartrate 25 MG tablet Commonly known as:  LOPRESSOR TAKE 1 TABLET(25 MG) BY MOUTH TWICE DAILY   nitroGLYCERIN 0.4 MG SL tablet Commonly known as:  NITROSTAT Place 1 tablet (0.4 mg total) under the tongue every 5 (five) minutes as needed for chest pain (up to 3 doses for severe chest pain. If no relief after 3rd dose, proceed to the ED for an evaluation).   NON FORMULARY EVERGO PORTABLE PULSE DOSE OXYGEN oxygen 3L per nasal cannula 24 hours a day Dx: 496   ONE TOUCH ULTRA TEST test strip Generic drug:  glucose blood USE TO TEST BLOOD SUGAR NO MORE THAN TWICE DAILY   onetouch ultrasoft lancets USE TO TEST BLOOD SUGAR NO MORE THAN TWICE DAILY   oxyCODONE-acetaminophen 7.5-325 MG tablet Commonly known as:  PERCOCET Take 1 tablet by mouth 3 (three) times daily. 3 times a day   pantoprazole 40 MG tablet Commonly known as:  PROTONIX Take 1 tablet (40 mg total) by mouth 2 (two) times daily.   PERFECT IRON PO Take  1 tablet by mouth every morning.   potassium chloride 10 MEQ tablet Commonly known as:  K-DUR TAKE 4 TABLETS BY MOUTH EVERY MORNING AND TAKE 2 TABLETS BY MOUTH EVERY EVENING   predniSONE 5 MG tablet Commonly known as:  DELTASONE Take 5 mg by mouth daily. Take as directed   SPIRIVA HANDIHALER 18 MCG inhalation capsule Generic drug:  tiotropium INHALE 1 CAPSULE BY MOUTH IN HANDIHALER DAILY   VENTOLIN HFA 108 (90 Base) MCG/ACT inhaler Generic drug:  albuterol INHALE 2 PUFFS  EVERY 4 HOURS AS NEEDED   vitamin B-12 250 MCG tablet Commonly known as:  CYANOCOBALAMIN Take 250 mcg by mouth daily.   vitamin C 500 MG tablet Commonly known as:  ASCORBIC ACID Take 500 mg by mouth daily.          Objective:   Physical Exam BP 128/80 (BP Location: Left Arm, Patient Position: Sitting, Cuff Size: Normal)   Pulse 69   Temp 98.1 F (36.7 C) (Oral)   Resp 14   Ht 6' (1.829 m)   Wt 227 lb 6 oz (103.1 kg)   SpO2 96%   BMI 30.84 kg/m  General:   Well developed, well nourished . NAD.  HEENT:  Normocephalic . Face symmetric, atraumatic Lungs:  CTA B Normal respiratory effort, no intercostal retractions, no accessory muscle use. Heart: Seems regular with occasional irregular beat,  no murmur.  No pretibial edema bilaterally  Skin: Not pale. Not jaundice Neurologic:  alert & oriented X3.  Speech normal, gait quite limited by MSK issues. Psych--  Cognition and judgment appear intact.  Cooperative with normal attention span and concentration.  Behavior appropriate. No anxious or depressed appearing.      Assessment & Plan:   Assessment  MSK: --RA --- Dr Sharren Bridge --Psoriasis --Osteoporosis Peripheral neuropathy, severe, several feet surgeries DM HTN Hyperlipidemia Pulmonary: --COPD --Pulmonary fibrosis --O2 24/ 7 --Chronic respiratory failure CV: --CAD, CHF, MI and  CABG 2007 --CP- CT chest no PE, cath 01-23-16, one occluded graft, rx medical mngmt  dx: question of  pericarditis  --Pericarditis, admitted 01/2016 --New  onset A. Flutter 09-2016, admitted. GI --Barrettts --GERD --esophageal dysmotility --Fatty liver Hoarseness , chronic, saw ENT , s/p botox 2015 , helped tempoarrily   PLAN A flutter:  New-onset 09/2016, s/p cardioversion, converted but is back on flutter; last note from cardiology reviewed, was referred to Dr. Ladona Ridgel for possible flutter ablation. Currently on eliquis,aspirin was stopped. Was on beta blockers, CCBs added. DM:   Diet control, check A1c. Last visit with the eye doctor 09-26-16, feet care by Dr. Samuel Bouche, orthopedic surgery. RA-- Well-controlled, check a CBC and CMP and fax to rheumatology. RTC 4 months.   Today, I spent more than 28  min with the patient: >50% of the time counseling regards new onset of a flutter, he is concerned about the fact he had no sx with it; discussed with the patient that that is frequently the case, he expressed all his concerns to me, he left reassure. I also reviewed a # of records.

## 2016-11-14 NOTE — Progress Notes (Signed)
Pre visit review using our clinic review tool, if applicable. No additional management support is needed unless otherwise documented below in the visit note. 

## 2016-11-15 NOTE — Assessment & Plan Note (Signed)
PLAN A flutter:  New-onset 09/2016, s/p cardioversion, converted but is back on flutter; last note from cardiology reviewed, was referred to Dr. Ladona Ridgel for possible flutter ablation. Currently on eliquis,aspirin was stopped. Was on beta blockers, CCBs added. DM:   Diet control, check A1c. Last visit with the eye doctor 09-26-16, feet care by Dr. Samuel Bouche, orthopedic surgery. RA-- Well-controlled, check a CBC and CMP and fax to rheumatology. RTC 4 months.

## 2016-11-25 ENCOUNTER — Telehealth: Payer: Self-pay | Admitting: Internal Medicine

## 2016-11-25 NOTE — Telephone Encounter (Signed)
Notes recorded by Conrad Newburg, CMA on 11/18/2016 at 7:48 AM EDT Results released to MyChart. ------  Notes recorded by Wanda Plump, MD on 11/17/2016 at 11:58 AM EDT Fas results to rheumatology then release them to mychart : Joseph [name], your labs look very good, your diabetes is well controlled ------  Notes recorded by Conrad Boone, CMA on 11/14/2016 at 5:06 PM EDT Results faxed to Dr. [phone].

## 2016-11-25 NOTE — Telephone Encounter (Signed)
Patient's wife called for results said they never heard back about the results for [facility] 11/15/16  Call back at [phone] or [phone]

## 2016-11-26 ENCOUNTER — Encounter: Payer: Self-pay | Admitting: Internal Medicine

## 2016-11-26 ENCOUNTER — Ambulatory Visit (INDEPENDENT_AMBULATORY_CARE_PROVIDER_SITE_OTHER): Payer: BC Managed Care – PPO | Admitting: Internal Medicine

## 2016-11-26 VITALS — BP 110/68 | HR 92 | Ht 72.0 in | Wt 220.0 lb

## 2016-11-26 DIAGNOSIS — Z01812 Encounter for preprocedural laboratory examination: Secondary | ICD-10-CM

## 2016-11-26 LAB — CBC WITH DIFFERENTIAL/PLATELET
Basophils Absolute: 0 10*3/uL (ref 0.0–0.2)
Basos: 0 %
EOS (ABSOLUTE): 0 10*3/uL (ref 0.0–0.4)
EOS: 1 %
HEMOGLOBIN: 13.9 g/dL (ref 13.0–17.7)
Hematocrit: 39.9 % (ref 37.5–51.0)
Lymphocytes Absolute: 1.9 10*3/uL (ref 0.7–3.1)
Lymphs: 48 %
MCH: 30.4 pg (ref 26.6–33.0)
MCHC: 34.8 g/dL (ref 31.5–35.7)
MCV: 87 fL (ref 79–97)
MONOS ABS: 0.6 10*3/uL (ref 0.1–0.9)
Monocytes: 15 %
NEUTROS PCT: 36 %
Neutrophils Absolute: 1.5 10*3/uL (ref 1.4–7.0)
Platelets: 134 10*3/uL — ABNORMAL LOW (ref 150–379)
RBC: 4.57 x10E6/uL (ref 4.14–5.80)
RDW: 16.9 % — ABNORMAL HIGH (ref 12.3–15.4)
WBC: 4 10*3/uL (ref 3.4–10.8)

## 2016-11-26 LAB — BASIC METABOLIC PANEL
BUN/Creatinine Ratio: 21 (ref 10–24)
BUN: 14 mg/dL (ref 8–27)
CALCIUM: 9.4 mg/dL (ref 8.6–10.2)
CHLORIDE: 98 mmol/L (ref 96–106)
CO2: 26 mmol/L (ref 18–29)
CREATININE: 0.67 mg/dL — AB (ref 0.76–1.27)
GFR calc Af Amer: 117 mL/min/{1.73_m2} (ref >59)
GFR, EST NON AFRICAN AMERICAN: 102 mL/min/{1.73_m2} (ref >59)
Glucose: 252 mg/dL — ABNORMAL HIGH (ref 65–99)
Potassium: 4 mmol/L (ref 3.5–5.2)
Sodium: 131 mmol/L — ABNORMAL LOW (ref 134–144)

## 2016-11-26 LAB — PROTIME-INR
INR: 1 (ref 0.8–1.2)
Prothrombin Time: 10.3 s (ref 9.1–12.0)

## 2016-11-26 NOTE — Telephone Encounter (Signed)
Spoke w/ Britta Mccreedy, Pt's wife, they requested I de-activate MyChart (done). Informed of results and will mail them to Pt.

## 2016-11-26 NOTE — Progress Notes (Signed)
HPI Joseph [name] is referred today by Dr. Antoine [name] for evaluation of atrial flutter. He is a pleasant 66 yo man with CAD, s/p CABG, h/o pericarditis, and most recently atrial flutter. He has had rapid as well as controlled ventricular rates. The patient denies chest pain. He has palpitations and can feel his heart race at times. He has been treated with both metoprolol and cardizem for his rapid ventricular rates which are now controlled. He was placed on systemic anti-coagulation with Eliquis. The patient has been therapeutic, not missing any of his doses. No Known Allergies   Current Outpatient Prescriptions  Medication Sig Dispense Refill  . ADVAIR DISKUS 100-50 MCG/DOSE AEPB INHALE 1 PUFF INTO THE LUNGS TWICE DAILY 60 each 5  . amitriptyline (ELAVIL) 75 MG tablet Take 2 tablets at bedtime    . apixaban (ELIQUIS) 5 MG TABS tablet Take 1 tablet (5 mg total) by mouth 2 (two) times daily. 60 tablet 3  . Ascorbic Acid (VITAMIN C) 500 MG tablet Take 500 mg by mouth daily.      Marland Kitchen atorvastatin (LIPITOR) 80 MG tablet TAKE 1 TABLET(80 MG) BY MOUTH DAILY 90 tablet 3  . B-D TB SYRINGE 1CC/27GX1/2" 27G X 1/2" 1 ML MISC Reported on 02/16/2016    . Carbonyl Iron (PERFECT IRON PO) Take 1 tablet by mouth every morning.     . clobetasol cream (TEMOVATE) 0.05 % Apply 1 application topically 2 (two) times daily.    Marland Kitchen diltiazem (CARDIZEM CD) 240 MG 24 hr capsule Take 1 capsule (240 mg total) by mouth daily. 30 capsule 3  . folic acid (FOLVITE) 800 MCG tablet Take 800 mcg by mouth daily.    . furosemide (LASIX) 20 MG tablet TAKE 2 TABLETS BY MOUTH EVERY MORNING AND TAKE 1 TABLET EVERY EVENING AT 4:00PM 270 tablet 3  . hydrocortisone cream 1 % Apply 1 application topically 3 (three) times daily as needed for itching (skin irritation). 30 g 0  . Lancets (ONETOUCH ULTRASOFT) lancets USE TO TEST BLOOD SUGAR NO MORE THAN TWICE DAILY 100 each 12  . Loteprednol Etabonate (LOTEMAX) 0.5 % GEL Place 1 drop into both  eyes every morning. daily    . Methotrexate Sodium, PF, 50 MG/2ML SOLN Inject 25 mg into the skin once a week.     . metoprolol tartrate (LOPRESSOR) 25 MG tablet TAKE 1 TABLET(25 MG) BY MOUTH TWICE DAILY 180 tablet 3  . Misc. Devices (ACAPELLA) MISC Use as directed 1 each 0  . nitroGLYCERIN (NITROSTAT) 0.4 MG SL tablet Place 1 tablet (0.4 mg total) under the tongue every 5 (five) minutes as needed for chest pain (up to 3 doses for severe chest pain. If no relief after 3rd dose, proceed to the ED for an evaluation). 25 tablet 2  . NON FORMULARY EVERGO PORTABLE PULSE DOSE OXYGEN oxygen 3L per nasal cannula 24 hours a day Dx: 496    . ONE TOUCH ULTRA TEST test strip USE TO TEST BLOOD SUGAR NO MORE THAN TWICE DAILY 100 each 12  . oxyCODONE-acetaminophen (PERCOCET) 7.5-325 MG per tablet Take 1 tablet by mouth 3 (three) times daily. 3 times a day    . pantoprazole (PROTONIX) 40 MG tablet Take 1 tablet (40 mg total) by mouth 2 (two) times daily. 180 tablet 3  . potassium chloride (K-DUR) 10 MEQ tablet TAKE 4 TABLETS BY MOUTH EVERY MORNING AND TAKE 2 TABLETS BY MOUTH EVERY EVENING 180 tablet 9  . predniSONE (DELTASONE) 5 MG  tablet Take 5 mg by mouth daily. Take as directed    . Respiratory Therapy Supplies (FLUTTER) DEVI 1 Inhaler by Does not apply route 2 (two) times daily. 1 each 0  . SPIRIVA HANDIHALER 18 MCG inhalation capsule INHALE 1 CAPSULE BY MOUTH IN HANDIHALER DAILY 30 capsule 2  . VENTOLIN HFA 108 (90 Base) MCG/ACT inhaler INHALE 2 PUFFS EVERY 4 HOURS AS NEEDED 18 g 2  . vitamin B-12 (CYANOCOBALAMIN) 250 MCG tablet Take 250 mcg by mouth daily.       No current facility-administered medications for this visit.      Past Medical History:  Diagnosis Date  . CAD (coronary artery disease)    MI 2007, CABG  . Cataracts, bilateral   . Complication of anesthesia    DIFFICULTY AFTER CABG HAD CONFUSION POST OP  . COPD (chronic obstructive pulmonary disease) (HCC)   . Diabetes mellitus    Dx  07-2008 A1C 6.2  . Esophageal dysmotility   . Fatty liver 10/11/09  . Gastroesophageal reflux disease   . Gastroparesis   . Hiatal hernia   . Hypertension   . Neuropathy (HCC)    (related to RA per neuro note 05-2011)  . Onychomycosis   . Osteoporosis    per DEXA 05-2008  . Oxygen dependent    3l  . Peripheral polyneuropathy (HCC)    Severe, causing problems with his feet, see surgeries  . Psoriasis   . Rheumatoid arthritis (HCC)    [facility] Rheumatology    ROS:   All systems reviewed and negative except as noted in the HPI.   Past Surgical History:  Procedure Laterality Date  . achiles tendon surgery  8/09  . CARDIAC CATHETERIZATION N/A 01/23/2016   Procedure: Left Heart Cath and Cors/Grafts Angiography;  Surgeon: Lyn Records, MD;  Location: [facility] INVASIVE CV LAB;  Service: Cardiovascular;  Laterality: N/A;  . CARDIOVERSION N/A 10/23/2016   Procedure: CARDIOVERSION;  Surgeon: Rollene Rotunda, MD;  Location: [facility] ENDOSCOPY;  Service: Cardiovascular;  Laterality: N/A;  . CHOLECYSTECTOMY    . CORONARY ARTERY BYPASS GRAFT  2007   (LIMA to the LAD, left radial to obtuse marginal, SVG to first diagonal, SVG to PDA. His ejection fraction to 50-55%  . ESOPHAGOGASTRODUODENOSCOPY N/A 10/19/2012   Procedure: ESOPHAGOGASTRODUODENOSCOPY (EGD);  Surgeon: Hart Carwin, MD;  Location: [facility] ENDOSCOPY;  Service: Endoscopy;  Laterality: N/A;  the dilatation is possible.   Marland Kitchen FOOT SURGERY     to tendon release and repair of osteomyelitis   . LARYNGOSCOPY  01/21/2014    DR BATES   . LARYNGOSCOPY N/A 01/21/2014   Procedure: LARYNGOSCOPY;  Surgeon: Christia Reading, MD;  Location: [facility] OR;  Service: ENT;  Laterality: N/A;  micro direct laryngoscopy with prolaryn injection/jet venturi ventilation  . TOE AMPUTATION  4-12/ 3 14   d/t a deformity, found to have osteomyelitis, complicated by post-op foot strtess FX     Family History  Problem Relation Age of Onset  . Heart disease Mother   . Diverticulitis Mother    . Heart disease Father   . Heart attack Father     x2  . Colon cancer Neg Hx   . Prostate cancer Neg Hx      Social History   Social History  . Marital status: Married    Spouse name: N/A  . Number of children: 0  . Years of education: N/A   Occupational History  . Disabled-retired Unemployed   Social History Main Topics  .  Smoking status: Former Smoker    Packs/day: 1.00    Years: 40.00    Quit date: 08/26/2005  . Smokeless tobacco: Former Neurosurgeon    Quit date: 04/02/2006     Comment: smoked x 40 years, 1 ppd  . Alcohol use No  . Drug use: No  . Sexual activity: Not on file   Other Topics Concern  . Not on file   Social History Narrative  . No narrative on file     BP 110/68   Pulse 92   Ht 6' (1.829 m)   Wt 220 lb (99.8 kg)   SpO2 93%   BMI 29.84 kg/m   Physical Exam:  Well appearing NAD HEENT: Unremarkable Neck:  No JVD, no thyromegally Lymphatics:  No adenopathy Back:  No CVA tenderness Lungs:  Clear HEART:  Regular rate rhythm, no murmurs, no rubs, no clicks Abd:  soft, positive bowel sounds, no organomegally, no rebound, no guarding Ext:  2 plus pulses, no edema, no cyanosis, no clubbing Skin:  No rashes no nodules Neuro:  CN II through XII intact, motor grossly intact  EKG - atrial flutter with a CVR  Assess/Plan: 1. Atrial flutter - I have discussed the treatment options with the patient and his wife. The risks/benefits/goals/expectations of catheter ablation were reviewed and he wishes to proceed.  2. CAD - he denies anginal symptoms. Will follow. 3. COPD - he will continue is bronchodilators.   Leonia Reeves.D.

## 2016-11-26 NOTE — Patient Instructions (Addendum)
Medication Instructions:  Your physician recommends that you continue on your current medications as directed. Please refer to the Current Medication list given to you today.   Labwork: Your physician recommends that you return for lab work in: TODAY (bmet, cbc, pt/inr)   Testing/Procedures: Your physician has recommended that you have an ablation. Catheter ablation is a medical procedure used to treat some cardiac arrhythmias (irregular heartbeats). During catheter ablation, a long, thin, flexible tube is put into a blood vessel in your groin (upper thigh), or neck. This tube is called an ablation catheter. It is then guided to your heart through the blood vessel. Radio frequency waves destroy small areas of heart tissue where abnormal heartbeats may cause an arrhythmia to start. Please see the instruction sheet given to you today.    Follow-Up: Your physician recommends that you schedule a follow-up appointment in: 4 weeks with Dr. Ladona Ridgel - Post A. Flutter Ablation   Any Other Special Instructions Will Be Listed Below (If Applicable).     If you need a refill on your cardiac medications before your next appointment, please call your pharmacy.

## 2016-11-27 ENCOUNTER — Encounter (HOSPITAL_COMMUNITY)
Admission: RE | Disposition: A | Discharge status: Home / Self Care | Payer: Self-pay | Source: Ambulatory Visit | Attending: Internal Medicine

## 2016-11-27 ENCOUNTER — Ambulatory Visit (HOSPITAL_COMMUNITY)
Admission: RE | Admit: 2016-11-27 | Discharge: 2016-11-27 | Disposition: A | Discharge status: Home / Self Care | Payer: BC Managed Care – PPO | Source: Ambulatory Visit | Attending: Internal Medicine | Admitting: Internal Medicine

## 2016-11-27 DIAGNOSIS — K449 Diaphragmatic hernia without obstruction or gangrene: Secondary | ICD-10-CM | POA: Insufficient documentation

## 2016-11-27 DIAGNOSIS — Z87891 Personal history of nicotine dependence: Secondary | ICD-10-CM | POA: Insufficient documentation

## 2016-11-27 DIAGNOSIS — I252 Old myocardial infarction: Secondary | ICD-10-CM | POA: Insufficient documentation

## 2016-11-27 DIAGNOSIS — J449 Chronic obstructive pulmonary disease, unspecified: Secondary | ICD-10-CM | POA: Insufficient documentation

## 2016-11-27 DIAGNOSIS — I2510 Atherosclerotic heart disease of native coronary artery without angina pectoris: Secondary | ICD-10-CM | POA: Diagnosis not present

## 2016-11-27 DIAGNOSIS — K3184 Gastroparesis: Secondary | ICD-10-CM | POA: Insufficient documentation

## 2016-11-27 DIAGNOSIS — Z7901 Long term (current) use of anticoagulants: Secondary | ICD-10-CM | POA: Diagnosis not present

## 2016-11-27 DIAGNOSIS — Z7952 Long term (current) use of systemic steroids: Secondary | ICD-10-CM | POA: Diagnosis not present

## 2016-11-27 DIAGNOSIS — I4892 Unspecified atrial flutter: Secondary | ICD-10-CM | POA: Diagnosis present

## 2016-11-27 DIAGNOSIS — Z7951 Long term (current) use of inhaled steroids: Secondary | ICD-10-CM | POA: Insufficient documentation

## 2016-11-27 DIAGNOSIS — M069 Rheumatoid arthritis, unspecified: Secondary | ICD-10-CM | POA: Diagnosis not present

## 2016-11-27 DIAGNOSIS — Z8249 Family history of ischemic heart disease and other diseases of the circulatory system: Secondary | ICD-10-CM | POA: Insufficient documentation

## 2016-11-27 DIAGNOSIS — I483 Typical atrial flutter: Secondary | ICD-10-CM | POA: Diagnosis present

## 2016-11-27 DIAGNOSIS — I10 Essential (primary) hypertension: Secondary | ICD-10-CM | POA: Diagnosis not present

## 2016-11-27 DIAGNOSIS — E1143 Type 2 diabetes mellitus with diabetic autonomic (poly)neuropathy: Secondary | ICD-10-CM | POA: Diagnosis not present

## 2016-11-27 DIAGNOSIS — Z951 Presence of aortocoronary bypass graft: Secondary | ICD-10-CM | POA: Diagnosis not present

## 2016-11-27 DIAGNOSIS — K219 Gastro-esophageal reflux disease without esophagitis: Secondary | ICD-10-CM | POA: Insufficient documentation

## 2016-11-27 DIAGNOSIS — Z9981 Dependence on supplemental oxygen: Secondary | ICD-10-CM | POA: Diagnosis not present

## 2016-11-27 HISTORY — PX: A-FLUTTER ABLATION: EP1230

## 2016-11-27 LAB — GLUCOSE, CAPILLARY
Glucose-Capillary: 133 mg/dL — ABNORMAL HIGH (ref 65–99)
Glucose-Capillary: 141 mg/dL — ABNORMAL HIGH (ref 65–99)

## 2016-11-27 SURGERY — A-FLUTTER ABLATION

## 2016-11-27 MED ORDER — ACETAMINOPHEN 325 MG PO TABS: 650.0000 mg | ORAL_TABLET | ORAL | Status: DC | PRN

## 2016-11-27 MED ORDER — BUPIVACAINE HCL (PF) 0.25 % IJ SOLN
INTRAMUSCULAR | Status: AC
  Filled 2016-11-27: qty 60

## 2016-11-27 MED ORDER — MIDAZOLAM HCL 5 MG/5ML IJ SOLN
INTRAMUSCULAR | Status: AC
  Filled 2016-11-27: qty 5

## 2016-11-27 MED ORDER — HEPARIN (PORCINE) IN NACL 2-0.9 UNIT/ML-% IJ SOLN
INTRAMUSCULAR | Status: DC | PRN
Start: 2016-11-27 — End: 2016-11-27
  Administered 2016-11-27: 09:00:00

## 2016-11-27 MED ORDER — HEPARIN (PORCINE) IN NACL 2-0.9 UNIT/ML-% IJ SOLN
INTRAMUSCULAR | Status: AC
  Filled 2016-11-27: qty 500

## 2016-11-27 MED ORDER — FENTANYL CITRATE (PF) 100 MCG/2ML IJ SOLN
INTRAMUSCULAR | Status: AC
  Filled 2016-11-27: qty 2

## 2016-11-27 MED ORDER — SODIUM CHLORIDE 0.9% FLUSH: 3.0000 mL | INTRAVENOUS | Status: DC | PRN

## 2016-11-27 MED ORDER — MIDAZOLAM HCL 5 MG/5ML IJ SOLN
INTRAMUSCULAR | Status: DC | PRN
  Administered 2016-11-27 (×9): 1 mg via INTRAVENOUS

## 2016-11-27 MED ORDER — SODIUM CHLORIDE 0.9 % IV SOLN: 250.0000 mL | INTRAVENOUS | Status: DC | PRN

## 2016-11-27 MED ORDER — ONDANSETRON HCL 4 MG/2ML IJ SOLN: 4.0000 mg | Freq: Four times a day (QID) | INTRAMUSCULAR | Status: DC | PRN

## 2016-11-27 MED ORDER — SODIUM CHLORIDE 0.9% FLUSH: 3.0000 mL | Freq: Two times a day (BID) | INTRAVENOUS | Status: DC

## 2016-11-27 MED ORDER — PREDNISONE 5 MG PO TABS: 5.0000 mg | ORAL_TABLET | Freq: Every day | ORAL | 0 refills | Status: DC

## 2016-11-27 MED ORDER — BUPIVACAINE HCL (PF) 0.25 % IJ SOLN
INTRAMUSCULAR | Status: DC | PRN
Start: 2016-11-27 — End: 2016-11-27
  Administered 2016-11-27: 60 mL

## 2016-11-27 MED ORDER — FENTANYL CITRATE (PF) 100 MCG/2ML IJ SOLN
INTRAMUSCULAR | Status: DC | PRN
  Administered 2016-11-27: 12.5 ug via INTRAVENOUS
  Administered 2016-11-27: 25 ug via INTRAVENOUS
  Administered 2016-11-27 (×6): 12.5 ug via INTRAVENOUS

## 2016-11-27 MED ORDER — SODIUM CHLORIDE 0.9 % IV SOLN
INTRAVENOUS | Status: DC
  Administered 2016-11-27: 07:00:00 via INTRAVENOUS

## 2016-11-27 SURGICAL SUPPLY — 11 items
BAG SNAP BAND KOVER 36X36 (MISCELLANEOUS) ×2 IMPLANT
CATH BLAZERPRIME XP (ABLATOR) ×2 IMPLANT
CATH JOSEPHSON QUAD-ALLRED 6FR (CATHETERS) ×2 IMPLANT
CATH POLARIS X 2.5/5/2.5 DECAP (CATHETERS) ×2 IMPLANT
PACK EP LATEX FREE (CUSTOM PROCEDURE TRAY) ×1
PACK EP LF (CUSTOM PROCEDURE TRAY) ×1 IMPLANT
PAD DEFIB LIFELINK (PAD) ×2 IMPLANT
SHEATH PINNACLE 6F 10CM (SHEATH) ×2 IMPLANT
SHEATH PINNACLE 7F 10CM (SHEATH) ×2 IMPLANT
SHEATH PINNACLE 8F 10CM (SHEATH) ×2 IMPLANT
SHIELD RADPAD SCOOP 12X17 (MISCELLANEOUS) ×2 IMPLANT

## 2016-11-27 NOTE — Progress Notes (Addendum)
Site area: RFV x 3 Site Prior to Removal:  Level 0 Pressure Applied For:20 min Manual:   yes Patient Status During Pull: stable  Post Pull Site:  Level 0 Post Pull Instructions Given:  yes Post Pull Pulses Present: palpable Dressing Applied:  tegaderm Bedrest begins @ 1100 till 1700 Comments:

## 2016-11-27 NOTE — Interval H&P Note (Signed)
History and Physical Interval Note:  11/27/2016 7:51 AM  Joseph [name]  has presented today for surgery, with the diagnosis of aflutter  The various methods of treatment have been discussed with the patient and family. After consideration of risks, benefits and other options for treatment, the patient has consented to  Procedure(s): A-Flutter Ablation (N/A) as a surgical intervention .  The patient's history has been reviewed, patient examined, no change in status, stable for surgery.  I have reviewed the patient's chart and labs.  Questions were answered to the patient's satisfaction.     Lewayne Bunting

## 2016-11-27 NOTE — Discharge Instructions (Signed)
No driving for 4 days (the patient reports he no longer drives). No lifting over 5 lbs for 1 week. No vigorous or sexual activity for 1 week. You may return to work on 12/04/16. Keep procedure site clean & dry. If you notice increased pain, swelling, bleeding or pus, call/return!  You may shower, but no soaking baths/hot tubs/pools for 1 week.    Femoral Site Care Refer to this sheet in the next few weeks. These instructions provide you with information about caring for yourself after your procedure. Your health care provider may also give you more specific instructions. Your treatment has been planned according to current medical practices, but problems sometimes occur. Call your health care provider if you have any problems or questions after your procedure. What can I expect after the procedure? After your procedure, it is typical to have the following:  Bruising at the site that usually fades within 1-2 weeks.  Blood collecting in the tissue (hematoma) that may be painful to the touch. It should usually decrease in size and tenderness within 1-2 weeks. Follow these instructions at home:  Take medicines only as directed by your health care provider.  You may shower 24-48 hours after the procedure or as directed by your health care provider. Remove the bandage (dressing) and gently wash the site with plain soap and water. Pat the area dry with a clean towel. Do not rub the site, because this may cause bleeding.  Do not take baths, swim, or use a hot tub until your health care provider approves.  Check your insertion site every day for redness, swelling, or drainage.  Do not apply powder or lotion to the site.  Limit use of stairs to twice a day for the first 2-3 days or as directed by your health care provider.  Do not squat for the first 2-3 days or as directed by your health care provider.  Do not lift over 10 lb (4.5 kg) for 5 days after your procedure or as directed by your health  care provider.  Ask your health care provider when it is okay to:  Return to work or school.  Resume usual physical activities or sports.  Resume sexual activity.  Do not drive home if you are discharged the same day as the procedure. Have someone else drive you.  You may drive 24 hours after the procedure unless otherwise instructed by your health care provider.  Do not operate machinery or power tools for 24 hours after the procedure or as directed by your health care provider.  If your procedure was done as an outpatient procedure, which means that you went home the same day as your procedure, a responsible adult should be with you for the first 24 hours after you arrive home.  Keep all follow-up visits as directed by your health care provider. This is important. Contact a health care provider if:  You have a fever.  You have chills.  You have increased bleeding from the site. Hold pressure on the site. Get help right away if:  You have unusual pain at the site.  You have redness, warmth, or swelling at the site.  You have drainage (other than a small amount of blood on the dressing) from the site.  The site is bleeding, and the bleeding does not stop after 30 minutes of holding steady pressure on the site call 911.  Your leg or foot becomes pale, cool, tingly, or numb. This information is not intended to replace  advice given to you by your health care provider. Make sure you discuss any questions you have with your health care provider. Document Released: 04/15/2014 Document Revised: 01/18/2016 Document Reviewed: 03/01/2014 Elsevier Interactive Patient Education  2017 [employer].

## 2016-11-27 NOTE — Progress Notes (Signed)
The patient is seen post procedure with Dr. Ladona Ridgel SR 90's on telemetry, VSS Patient denies any CP, SOB, procedure site discomfort He has L knee pain with an arthritic flare over the last few days (known pre-procedure) Activity restrictions, wound care were discussed with the patient Follow up has been arranged Will Rx prednisone regime per Dr. Ladona Ridgel 20mg  daily for 3 days, then 15mg  daily for 3 days, then 10mg  daily for 3 days, then resume his usual 5mg  daily dose.  This was discussed with the patient andhis wife.  The patient will discharge after his bedrest is completed, after ambulation and if procedure site remains stable.  , PA-C  EP Attending  Patient seen and examined with , PA-C as above. He is stable for DC home. He has a flair up of arthritis involving his left leg and I have prescribed him a brief steroid taper.   .D.

## 2016-11-27 NOTE — H&P (View-Only) (Signed)
HPI Joseph [name] is referred today by Dr. Antoine Poche for evaluation of atrial flutter. He is a pleasant 66 yo man with CAD, s/p CABG, h/o pericarditis, and most recently atrial flutter. He has had rapid as well as controlled ventricular rates. The patient denies chest pain. He has palpitations and can feel his heart race at times. He has been treated with both metoprolol and cardizem for his rapid ventricular rates which are now controlled. He was placed on systemic anti-coagulation with Eliquis. The patient has been therapeutic, not missing any of his doses. No Known Allergies   Current Outpatient Prescriptions  Medication Sig Dispense Refill  . ADVAIR DISKUS 100-50 MCG/DOSE AEPB INHALE 1 PUFF INTO THE LUNGS TWICE DAILY 60 each 5  . amitriptyline (ELAVIL) 75 MG tablet Take 2 tablets at bedtime    . apixaban (ELIQUIS) 5 MG TABS tablet Take 1 tablet (5 mg total) by mouth 2 (two) times daily. 60 tablet 3  . Ascorbic Acid (VITAMIN C) 500 MG tablet Take 500 mg by mouth daily.      Marland Kitchen atorvastatin (LIPITOR) 80 MG tablet TAKE 1 TABLET(80 MG) BY MOUTH DAILY 90 tablet 3  . B-D TB SYRINGE 1CC/27GX1/2" 27G X 1/2" 1 ML MISC Reported on 02/16/2016    . Carbonyl Iron (PERFECT IRON PO) Take 1 tablet by mouth every morning.     . clobetasol cream (TEMOVATE) 0.05 % Apply 1 application topically 2 (two) times daily.    Marland Kitchen diltiazem (CARDIZEM CD) 240 MG 24 hr capsule Take 1 capsule (240 mg total) by mouth daily. 30 capsule 3  . folic acid (FOLVITE) 800 MCG tablet Take 800 mcg by mouth daily.    . furosemide (LASIX) 20 MG tablet TAKE 2 TABLETS BY MOUTH EVERY MORNING AND TAKE 1 TABLET EVERY EVENING AT 4:00PM 270 tablet 3  . hydrocortisone cream 1 % Apply 1 application topically 3 (three) times daily as needed for itching (skin irritation). 30 g 0  . Lancets (ONETOUCH ULTRASOFT) lancets USE TO TEST BLOOD SUGAR NO MORE THAN TWICE DAILY 100 each 12  . Loteprednol Etabonate (LOTEMAX) 0.5 % GEL Place 1 drop into both  eyes every morning. daily    . Methotrexate Sodium, PF, 50 MG/2ML SOLN Inject 25 mg into the skin once a week.     . metoprolol tartrate (LOPRESSOR) 25 MG tablet TAKE 1 TABLET(25 MG) BY MOUTH TWICE DAILY 180 tablet 3  . Misc. Devices (ACAPELLA) MISC Use as directed 1 each 0  . nitroGLYCERIN (NITROSTAT) 0.4 MG SL tablet Place 1 tablet (0.4 mg total) under the tongue every 5 (five) minutes as needed for chest pain (up to 3 doses for severe chest pain. If no relief after 3rd dose, proceed to the ED for an evaluation). 25 tablet 2  . NON FORMULARY EVERGO PORTABLE PULSE DOSE OXYGEN oxygen 3L per nasal cannula 24 hours a day Dx: 496    . ONE TOUCH ULTRA TEST test strip USE TO TEST BLOOD SUGAR NO MORE THAN TWICE DAILY 100 each 12  . oxyCODONE-acetaminophen (PERCOCET) 7.5-325 MG per tablet Take 1 tablet by mouth 3 (three) times daily. 3 times a day    . pantoprazole (PROTONIX) 40 MG tablet Take 1 tablet (40 mg total) by mouth 2 (two) times daily. 180 tablet 3  . potassium chloride (K-DUR) 10 MEQ tablet TAKE 4 TABLETS BY MOUTH EVERY MORNING AND TAKE 2 TABLETS BY MOUTH EVERY EVENING 180 tablet 9  . predniSONE (DELTASONE) 5 MG  tablet Take 5 mg by mouth daily. Take as directed    . Respiratory Therapy Supplies (FLUTTER) DEVI 1 Inhaler by Does not apply route 2 (two) times daily. 1 each 0  . SPIRIVA HANDIHALER 18 MCG inhalation capsule INHALE 1 CAPSULE BY MOUTH IN HANDIHALER DAILY 30 capsule 2  . VENTOLIN HFA 108 (90 Base) MCG/ACT inhaler INHALE 2 PUFFS EVERY 4 HOURS AS NEEDED 18 g 2  . vitamin B-12 (CYANOCOBALAMIN) 250 MCG tablet Take 250 mcg by mouth daily.       No current facility-administered medications for this visit.      Past Medical History:  Diagnosis Date  . CAD (coronary artery disease)    MI 2007, CABG  . Cataracts, bilateral   . Complication of anesthesia    DIFFICULTY AFTER CABG HAD CONFUSION POST OP  . COPD (chronic obstructive pulmonary disease) (HCC)   . Diabetes mellitus    Dx  07-2008 A1C 6.2  . Esophageal dysmotility   . Fatty liver 10/11/09  . Gastroesophageal reflux disease   . Gastroparesis   . Hiatal hernia   . Hypertension   . Neuropathy (HCC)    (related to RA per neuro note 05-2011)  . Onychomycosis   . Osteoporosis    per DEXA 05-2008  . Oxygen dependent    3l  . Peripheral polyneuropathy (HCC)    Severe, causing problems with his feet, see surgeries  . Psoriasis   . Rheumatoid arthritis (HCC)    [facility] Rheumatology    ROS:   All systems reviewed and negative except as noted in the HPI.   Past Surgical History:  Procedure Laterality Date  . achiles tendon surgery  8/09  . CARDIAC CATHETERIZATION N/A 01/23/2016   Procedure: Left Heart Cath and Cors/Grafts Angiography;  Surgeon: Lyn Records, MD;  Location: [facility] INVASIVE CV LAB;  Service: Cardiovascular;  Laterality: N/A;  . CARDIOVERSION N/A 10/23/2016   Procedure: CARDIOVERSION;  Surgeon: Rollene Rotunda, MD;  Location: [facility] ENDOSCOPY;  Service: Cardiovascular;  Laterality: N/A;  . CHOLECYSTECTOMY    . CORONARY ARTERY BYPASS GRAFT  2007   (LIMA to the LAD, left radial to obtuse marginal, SVG to first diagonal, SVG to PDA. His ejection fraction to 50-55%  . ESOPHAGOGASTRODUODENOSCOPY N/A 10/19/2012   Procedure: ESOPHAGOGASTRODUODENOSCOPY (EGD);  Surgeon: Hart Carwin, MD;  Location: [facility] ENDOSCOPY;  Service: Endoscopy;  Laterality: N/A;  the dilatation is possible.   Marland Kitchen FOOT SURGERY     to tendon release and repair of osteomyelitis   . LARYNGOSCOPY  01/21/2014    DR BATES   . LARYNGOSCOPY N/A 01/21/2014   Procedure: LARYNGOSCOPY;  Surgeon: Christia Reading, MD;  Location: [facility] OR;  Service: ENT;  Laterality: N/A;  micro direct laryngoscopy with prolaryn injection/jet venturi ventilation  . TOE AMPUTATION  4-12/ 3 14   d/t a deformity, found to have osteomyelitis, complicated by post-op foot strtess FX     Family History  Problem Relation Age of Onset  . Heart disease Mother   . Diverticulitis Mother    . Heart disease Father   . Heart attack Father     x2  . Colon cancer Neg Hx   . Prostate cancer Neg Hx      Social History   Social History  . Marital status: Married    Spouse name: N/A  . Number of children: 0  . Years of education: N/A   Occupational History  . Disabled-retired Unemployed   Social History Main Topics  .  Smoking status: Former Smoker    Packs/day: 1.00    Years: 40.00    Quit date: 08/26/2005  . Smokeless tobacco: Former User    Quit date: 04/02/2006     Comment: smoked x 40 years, 1 ppd  . Alcohol use No  . Drug use: No  . Sexual activity: Not on file   Other Topics Concern  . Not on file   Social History Narrative  . No narrative on file     BP 110/68   Pulse 92   Ht 6' (1.829 m)   Wt 220 lb (99.8 kg)   SpO2 93%   BMI 29.84 kg/m   Physical Exam:  Well appearing NAD HEENT: Unremarkable Neck:  No JVD, no thyromegally Lymphatics:  No adenopathy Back:  No CVA tenderness Lungs:  Clear HEART:  Regular rate rhythm, no murmurs, no rubs, no clicks Abd:  soft, positive bowel sounds, no organomegally, no rebound, no guarding Ext:  2 plus pulses, no edema, no cyanosis, no clubbing Skin:  No rashes no nodules Neuro:  CN II through XII intact, motor grossly intact  EKG - atrial flutter with a CVR  Assess/Plan: 1. Atrial flutter - I have discussed the treatment options with the patient and his wife. The risks/benefits/goals/expectations of catheter ablation were reviewed and he wishes to proceed.  2. CAD - he denies anginal symptoms. Will follow. 3. COPD - he will continue is bronchodilators.   Kaz Auld,M.D. 

## 2016-11-28 ENCOUNTER — Encounter (HOSPITAL_COMMUNITY): Payer: Self-pay | Admitting: Internal Medicine

## 2016-12-06 ENCOUNTER — Telehealth: Payer: Self-pay | Admitting: Pulmonary Disease

## 2016-12-06 NOTE — Telephone Encounter (Signed)
Ok

## 2016-12-06 NOTE — Telephone Encounter (Signed)
Pt use to use [facility] but they are no longer in their insurance network, [facility] is. Unfortunately to qualify pt for POC an OV is needed. Spoke with wife who stated pt has a back up POC that he is currently using an she agreed to the ov. Offered to place an order to [facility] to get them to fix his broken machine but she declined. Appointment has been made nothing further is needed. Advised her that is anything changes with his breathing to seek emergency care. Nothing further is needed

## 2016-12-06 NOTE — Telephone Encounter (Signed)
lmomtcb x1 

## 2016-12-06 NOTE — Telephone Encounter (Signed)
MW  Please Advise- VS is off pm  Pt POC is currently broken no air coming through and pt is needing an order for a new one, and they wanted to know if it is ok to place the order. Pt is on 3L 24/7.

## 2016-12-09 ENCOUNTER — Encounter: Payer: Self-pay | Admitting: Pulmonary Disease

## 2016-12-09 ENCOUNTER — Ambulatory Visit (INDEPENDENT_AMBULATORY_CARE_PROVIDER_SITE_OTHER): Payer: BC Managed Care – PPO | Admitting: Pulmonary Disease

## 2016-12-09 VITALS — BP 104/60 | HR 100 | Ht 72.0 in | Wt 218.8 lb

## 2016-12-09 DIAGNOSIS — J9611 Chronic respiratory failure with hypoxia: Secondary | ICD-10-CM | POA: Diagnosis not present

## 2016-12-09 DIAGNOSIS — J438 Other emphysema: Secondary | ICD-10-CM

## 2016-12-09 NOTE — Patient Instructions (Signed)
Continue using inhalers as prescribed We will reorder clear oxygen 3 L  Follow up with Dr. Craige Cotta

## 2016-12-09 NOTE — Progress Notes (Signed)
Joseph [name]    [id number]    [birth year]  Primary Care Physician:Jose Drue Novel, MD  Referring Physician: Wanda Plump, MD [address], [place] [address]  Chief complaint:   Here for re qualification to get supplemental oxygen  HPI: 66 year old with history of bronchiectasis, emphysema, rheumatoid arthritis on methotrexate and chronic prednisone medication pneumonia secondary to aspiration. His concentrator at home broke and he needs to be qualified to get a new one. He continues on 3 L oxygen at home, Advair and Spiriva and albuterol. He is using the flutter valve every day He has had recent issues with atrial flutter with rapid ventricular response and underwent ablation earlier this month.  Outpatient Encounter Prescriptions as of 12/09/2016  Medication Sig  . ADVAIR DISKUS 100-50 MCG/DOSE AEPB INHALE 1 PUFF INTO THE LUNGS TWICE DAILY  . amitriptyline (ELAVIL) 75 MG tablet Take 2 tablets at bedtime  . apixaban (ELIQUIS) 5 MG TABS tablet Take 1 tablet (5 mg total) by mouth 2 (two) times daily.  . Ascorbic Acid (VITAMIN C) 500 MG tablet Take 500 mg by mouth daily.    Marland Kitchen atorvastatin (LIPITOR) 80 MG tablet TAKE 1 TABLET(80 MG) BY MOUTH DAILY  . B-D TB SYRINGE 1CC/27GX1/2" 27G X 1/2" 1 ML MISC Reported on 02/16/2016  . Carbonyl Iron (PERFECT IRON PO) Take 1 tablet by mouth every morning.   . clobetasol cream (TEMOVATE) 0.05 % Apply 1 application topically 2 (two) times daily.  . folic acid (FOLVITE) 800 MCG tablet Take 800 mcg by mouth daily.  . furosemide (LASIX) 20 MG tablet TAKE 2 TABLETS BY MOUTH EVERY MORNING AND TAKE 1 TABLET EVERY EVENING AT 4:00PM  . hydrocortisone cream 1 % Apply 1 application topically 3 (three) times daily as needed for itching (skin irritation).  . Lancets (ONETOUCH ULTRASOFT) lancets USE TO TEST BLOOD SUGAR NO MORE THAN TWICE DAILY  . Loteprednol Etabonate (LOTEMAX) 0.5 % GEL Place 1 drop into both eyes every morning. daily  .  methotrexate 50 MG/2ML injection Inject 25 mg into the muscle every Thursday.   . metoprolol tartrate (LOPRESSOR) 25 MG tablet TAKE 1 TABLET(25 MG) BY MOUTH TWICE DAILY  . Misc. Devices (ACAPELLA) MISC Use as directed  . nitroGLYCERIN (NITROSTAT) 0.4 MG SL tablet Place 1 tablet (0.4 mg total) under the tongue every 5 (five) minutes as needed for chest pain (up to 3 doses for severe chest pain. If no relief after 3rd dose, proceed to the ED for an evaluation).  . NON FORMULARY EVERGO PORTABLE PULSE DOSE OXYGEN oxygen 3L per nasal cannula 24 hours a day Dx: 496  . ONE TOUCH ULTRA TEST test strip USE TO TEST BLOOD SUGAR NO MORE THAN TWICE DAILY  . oxyCODONE-acetaminophen (PERCOCET) 7.5-325 MG per tablet Take 1 tablet by mouth 3 (three) times daily. 3 times a day  . pantoprazole (PROTONIX) 40 MG tablet Take 1 tablet (40 mg total) by mouth 2 (two) times daily.  . potassium chloride (K-DUR) 10 MEQ tablet TAKE 4 TABLETS BY MOUTH EVERY MORNING AND TAKE 2 TABLETS BY MOUTH EVERY EVENING  . predniSONE (DELTASONE) 5 MG tablet Take 5 mg by mouth daily. Take as directed  . Respiratory Therapy Supplies (FLUTTER) DEVI 1 Inhaler by Does not apply route 2 (two) times daily.  Marland Kitchen SPIRIVA HANDIHALER 18 MCG inhalation capsule INHALE 1 CAPSULE BY MOUTH IN HANDIHALER DAILY  . VENTOLIN HFA 108 (90 Base) MCG/ACT inhaler INHALE  2 PUFFS EVERY 4 HOURS AS NEEDED  . vitamin B-12 (CYANOCOBALAMIN) 250 MCG tablet Take 250 mcg by mouth daily.    . [DISCONTINUED] predniSONE (DELTASONE) 5 MG tablet Take 1 tablet (5 mg total) by mouth daily with breakfast. Take 20mg  (4 tablets) by mouth once daily for 3 days, then take 15mg  (3 tablets) by mouth once daily, then take 10mg  (2 tablets) by mouth once daily for 3 days, then resume your usual 5mg  daily dose   No facility-administered encounter medications on file as of 12/09/2016.     Allergies as of 12/09/2016  . (No Known Allergies)    Past Medical History:  Diagnosis Date  . CAD  (coronary artery disease)    MI 2007, CABG  . Cataracts, bilateral   . Complication of anesthesia    DIFFICULTY AFTER CABG HAD CONFUSION POST OP  . COPD (chronic obstructive pulmonary disease) (HCC)   . Diabetes mellitus    Dx 07-2008 A1C 6.2  . Esophageal dysmotility   . Fatty liver 10/11/09  . Gastroesophageal reflux disease   . Gastroparesis   . Hiatal hernia   . Hypertension   . Neuropathy    (related to RA per neuro note 05-2011)  . Onychomycosis   . Osteoporosis    per DEXA 05-2008  . Oxygen dependent    3l  . Peripheral polyneuropathy    Severe, causing problems with his feet, see surgeries  . Psoriasis   . Rheumatoid arthritis [facility])    [facility] Rheumatology    Past Surgical History:  Procedure Laterality Date  . A-FLUTTER ABLATION N/A 11/27/2016   Procedure: A-Flutter Ablation;  Surgeon: 06-2008, MD;  Location: [facility] INVASIVE CV LAB;  Service: Cardiovascular;  Laterality: N/A;  . achiles tendon surgery  8/09  . CARDIAC CATHETERIZATION N/A 01/23/2016   Procedure: Left Heart Cath and Cors/Grafts Angiography;  Surgeon: Marinus Maw, MD;  Location: [facility] INVASIVE CV LAB;  Service: Cardiovascular;  Laterality: N/A;  . CARDIOVERSION N/A 10/23/2016   Procedure: CARDIOVERSION;  Surgeon: 01/25/2016, MD;  Location: [facility] ENDOSCOPY;  Service: Cardiovascular;  Laterality: N/A;  . CHOLECYSTECTOMY    . CORONARY ARTERY BYPASS GRAFT  2007   (LIMA to the LAD, left radial to obtuse marginal, SVG to first diagonal, SVG to PDA. His ejection fraction to 50-55%  . ESOPHAGOGASTRODUODENOSCOPY N/A 10/19/2012   Procedure: ESOPHAGOGASTRODUODENOSCOPY (EGD);  Surgeon: Rollene Rotunda, MD;  Location: [facility] ENDOSCOPY;  Service: Endoscopy;  Laterality: N/A;  the dilatation is possible.   2008 FOOT SURGERY     to tendon release and repair of osteomyelitis   . LARYNGOSCOPY  01/21/2014    DR BATES   . LARYNGOSCOPY N/A 01/21/2014   Procedure: LARYNGOSCOPY;  Surgeon: [facility], MD;  Location: [facility] OR;  Service:  ENT;  Laterality: N/A;  micro direct laryngoscopy with prolaryn injection/jet venturi ventilation  . TOE AMPUTATION  4-12/ 3 14   d/t a deformity, found to have osteomyelitis, complicated by post-op foot strtess FX    Family History  Problem Relation Age of Onset  . Heart disease Mother   . Diverticulitis Mother   . Heart disease Father   . Heart attack Father     x2  . Colon cancer Neg Hx   . Prostate cancer Neg Hx     Social History   Social History  . Marital status: Married    Spouse name: N/A  . Number of children: 0  . Years of education: N/A  Occupational History  . Disabled-retired Unemployed   Social History Main Topics  . Smoking status: Former Smoker    Packs/day: 1.00    Years: 40.00    Quit date: 08/26/2005  . Smokeless tobacco: Former Neurosurgeon    Quit date: 04/02/2006     Comment: smoked x 40 years, 1 ppd  . Alcohol use No  . Drug use: No  . Sexual activity: Not on file   Other Topics Concern  . Not on file   Social History Narrative  . No narrative on file    Review of systems: Review of Systems  Constitutional: Negative for fever and chills.  HENT: Negative.   Eyes: Negative for blurred vision.  Respiratory: as per HPI  Cardiovascular: Negative for chest pain and palpitations.  Gastrointestinal: Negative for vomiting, diarrhea, blood per rectum. Genitourinary: Negative for dysuria, urgency, frequency and hematuria.  Musculoskeletal: Negative for myalgias, back pain and joint pain.  Skin: Negative for itching and rash.  Neurological: Negative for dizziness, tremors, focal weakness, seizures and loss of consciousness.  Endo/Heme/Allergies: Negative for environmental allergies.  Psychiatric/Behavioral: Negative for depression, suicidal ideas and hallucinations.  All other systems reviewed and are negative.  Physical Exam: Blood pressure 104/60, pulse 100, height 6' (1.829 m), weight 218 lb 12.8 oz (99.2 kg), SpO2 94 %. Gen:      No acute  distress HEENT:  EOMI, sclera anicteric Neck:     No masses; no thyromegaly Lungs:    Scattered crackles, no wheeze; normal respiratory effort CV:         Regular rate and rhythm; no murmurs Abd:      + bowel sounds; soft, non-tender; no palpable masses, no distension Ext:    No edema; adequate peripheral perfusion Skin:      Warm and dry; no rash Neuro: alert and oriented x 3 Psych: normal mood and affect  Data Reviewed: CT scan 02/23/16-moderate emphysematous changes, new nodular process over the lower lobe, bronchiectasis Chest x-ray 10/02/16-chronic fibrotic changes I reviewed the images personally  PFT 01/12/09 >> FEV1 3.02 (85%), FEV1% 68, TLC 6.35 (88%), DLCO 45%, no BD PFT 12/02/12 >> FEV1 3.11 (78%), FEV1% 77, TLC 6.32 (83%), DLCO 37%, no BD  Assessment/Plan:  COPD, emphysema and bullous changes Bronchiectasis Recurrent aspiration pneumonia Rheumatoid arthritis  Continue inhalers including Advair, Spiriva and albuterol Continue flutter Ambulatory oxygen test in office today to re qualify. Reorder home O2 supplies.   Follow up with Dr. Alease Medina MD [facility] Pager [phone] 12/09/2016, 2:40 PM  CC: Wanda Plump, MD

## 2016-12-19 ENCOUNTER — Telehealth: Payer: Self-pay | Admitting: Nurse Practitioner

## 2016-12-19 NOTE — Telephone Encounter (Signed)
   Pt is recently s/p aflutter ablation.  He has been feeling exceptionally well since then.  He has noted that at times his HR hovers around 100-110.  He was seen by rheumatology today and advised to contact his cardiologist b/c his hr was 113.  He remains asymptomatic.  I rec that he call back in the am to arrange for ECG in the office.  If he becomes symptomatic, he knows to come to the ED .  Caller verbalized understanding and was grateful for the call back.  Nicolasa Ducking, NP 12/19/2016, 5:30 PM

## 2016-12-20 ENCOUNTER — Ambulatory Visit (INDEPENDENT_AMBULATORY_CARE_PROVIDER_SITE_OTHER): Payer: BC Managed Care – PPO

## 2016-12-20 DIAGNOSIS — I483 Typical atrial flutter: Secondary | ICD-10-CM | POA: Diagnosis not present

## 2016-12-20 NOTE — Telephone Encounter (Signed)
Spoke with wife, ECG appt made on nurse schedule for today at 2pm.

## 2017-01-08 NOTE — Progress Notes (Signed)
Electrophysiology Office Note Date: 01/09/2017  ID:  Joseph [name], DOB [birth year], MRN [id number]  PCP: Wanda Plump, MD Primary Cardiologist: Hochrein Electrophysiologist: Ladona Ridgel  CC: follow up post flutter ablation  Joseph [name] is a 66 y.o. male seen today for Dr Ladona Ridgel.  He presents today for routine electrophysiology followup.  Since last being seen in our clinic, the patient reports doing very well. He had an episode where his HR was elevated after walking into rheumatologist's office at [facility]. He came for EKG following that which demonstrated SR.  He has stable fatigue and shortness of breath.  He denies chest pain, palpitations, PND, orthopnea, nausea, vomiting, dizziness, syncope, edema, weight gain, or early satiety.  Past Medical History:  Diagnosis Date  . CAD (coronary artery disease)    MI 2007, CABG  . Cataracts, bilateral   . Complication of anesthesia    DIFFICULTY AFTER CABG HAD CONFUSION POST OP  . COPD (chronic obstructive pulmonary disease) (HCC)   . Diabetes mellitus    Dx 07-2008 A1C 6.2  . Esophageal dysmotility   . Fatty liver 10/11/09  . Gastroesophageal reflux disease   . Gastroparesis   . Hiatal hernia   . Hypertension   . Neuropathy    (related to RA per neuro note 05-2011)  . Onychomycosis   . Osteoporosis    per DEXA 05-2008  . Oxygen dependent    3l  . Peripheral polyneuropathy    Severe, causing problems with his feet, see surgeries  . Psoriasis   . Rheumatoid arthritis [facility])    [facility] Rheumatology   Past Surgical History:  Procedure Laterality Date  . A-FLUTTER ABLATION N/A 11/27/2016   Procedure: A-Flutter Ablation;  Surgeon: Marinus Maw, MD;  Location: [facility] INVASIVE CV LAB;  Service: Cardiovascular;  Laterality: N/A;  . achiles tendon surgery  8/09  . CARDIAC CATHETERIZATION N/A 01/23/2016   Procedure: Left Heart Cath and Cors/Grafts Angiography;  Surgeon: Lyn Records, MD;  Location: [facility] INVASIVE CV LAB;  Service:  Cardiovascular;  Laterality: N/A;  . CARDIOVERSION N/A 10/23/2016   Procedure: CARDIOVERSION;  Surgeon: Rollene Rotunda, MD;  Location: [facility] ENDOSCOPY;  Service: Cardiovascular;  Laterality: N/A;  . CHOLECYSTECTOMY    . CORONARY ARTERY BYPASS GRAFT  2007   (LIMA to the LAD, left radial to obtuse marginal, SVG to first diagonal, SVG to PDA. His ejection fraction to 50-55%  . ESOPHAGOGASTRODUODENOSCOPY N/A 10/19/2012   Procedure: ESOPHAGOGASTRODUODENOSCOPY (EGD);  Surgeon: Hart Carwin, MD;  Location: [facility] ENDOSCOPY;  Service: Endoscopy;  Laterality: N/A;  the dilatation is possible.   Marland Kitchen FOOT SURGERY     to tendon release and repair of osteomyelitis   . LARYNGOSCOPY  01/21/2014    DR BATES   . LARYNGOSCOPY N/A 01/21/2014   Procedure: LARYNGOSCOPY;  Surgeon: Christia Reading, MD;  Location: [facility] OR;  Service: ENT;  Laterality: N/A;  micro direct laryngoscopy with prolaryn injection/jet venturi ventilation  . TOE AMPUTATION  4-12/ 3 14   d/t a deformity, found to have osteomyelitis, complicated by post-op foot strtess FX    Current Outpatient Prescriptions  Medication Sig Dispense Refill  . ADVAIR DISKUS 100-50 MCG/DOSE AEPB INHALE 1 PUFF INTO THE LUNGS TWICE DAILY 60 each 5  . amitriptyline (ELAVIL) 75 MG tablet Take 2 tablets at bedtime    . Ascorbic Acid (VITAMIN C) 500 MG tablet Take 500 mg by mouth daily.      Marland Kitchen atorvastatin (LIPITOR) 80 MG tablet TAKE  1 TABLET(80 MG) BY MOUTH DAILY 90 tablet 3  . B-D TB SYRINGE 1CC/27GX1/2" 27G X 1/2" 1 ML MISC Reported on 02/16/2016    . Carbonyl Iron (PERFECT IRON PO) Take 1 tablet by mouth every morning.     . clobetasol cream (TEMOVATE) 0.05 % Apply 1 application topically 2 (two) times daily.    . folic acid (FOLVITE) 800 MCG tablet Take 800 mcg by mouth daily.    . furosemide (LASIX) 20 MG tablet TAKE 2 TABLETS BY MOUTH EVERY MORNING AND TAKE 1 TABLET EVERY EVENING AT 4:00PM 270 tablet 3  . hydrocortisone cream 1 % Apply 1 application topically 3 (three) times  daily as needed for itching (skin irritation). 30 g 0  . Lancets (ONETOUCH ULTRASOFT) lancets USE TO TEST BLOOD SUGAR NO MORE THAN TWICE DAILY 100 each 12  . Loteprednol Etabonate (LOTEMAX) 0.5 % GEL Place 1 drop into both eyes every morning. daily    . methotrexate 50 MG/2ML injection Inject 25 mg into the muscle every Thursday.     . metoprolol tartrate (LOPRESSOR) 25 MG tablet TAKE 1 TABLET(25 MG) BY MOUTH TWICE DAILY 180 tablet 3  . Misc. Devices (ACAPELLA) MISC Use as directed 1 each 0  . nitroGLYCERIN (NITROSTAT) 0.4 MG SL tablet Place 1 tablet (0.4 mg total) under the tongue every 5 (five) minutes as needed for chest pain (up to 3 doses for severe chest pain. If no relief after 3rd dose, proceed to the ED for an evaluation). 25 tablet 2  . NON FORMULARY EVERGO PORTABLE PULSE DOSE OXYGEN oxygen 3L per nasal cannula 24 hours a day Dx: 496    . ONE TOUCH ULTRA TEST test strip USE TO TEST BLOOD SUGAR NO MORE THAN TWICE DAILY 100 each 12  . oxyCODONE-acetaminophen (PERCOCET) 7.5-325 MG per tablet Take 1 tablet by mouth 3 (three) times daily. 3 times a day    . pantoprazole (PROTONIX) 40 MG tablet Take 1 tablet (40 mg total) by mouth 2 (two) times daily. 180 tablet 3  . potassium chloride (K-DUR) 10 MEQ tablet TAKE 4 TABLETS BY MOUTH EVERY MORNING AND TAKE 2 TABLETS BY MOUTH EVERY EVENING 180 tablet 9  . predniSONE (DELTASONE) 5 MG tablet Take 5 mg by mouth daily. Take as directed    . Respiratory Therapy Supplies (FLUTTER) DEVI 1 Inhaler by Does not apply route 2 (two) times daily. 1 each 0  . SPIRIVA HANDIHALER 18 MCG inhalation capsule INHALE 1 CAPSULE BY MOUTH IN HANDIHALER DAILY 30 capsule 2  . VENTOLIN HFA 108 (90 Base) MCG/ACT inhaler INHALE 2 PUFFS EVERY 4 HOURS AS NEEDED 18 g 2  . vitamin B-12 (CYANOCOBALAMIN) 250 MCG tablet Take 250 mcg by mouth daily.       No current facility-administered medications for this visit.     Allergies:   Patient has no known allergies.   Social  History: Social History   Social History  . Marital status: Married    Spouse name: N/A  . Number of children: 0  . Years of education: N/A   Occupational History  . Disabled-retired Unemployed   Social History Main Topics  . Smoking status: Former Smoker    Packs/day: 1.00    Years: 40.00    Quit date: 08/26/2005  . Smokeless tobacco: Former Neurosurgeon    Quit date: 04/02/2006     Comment: smoked x 40 years, 1 ppd  . Alcohol use No  . Drug use: No  . Sexual activity: Not  on file   Other Topics Concern  . Not on file   Social History Narrative  . No narrative on file    Family History: Family History  Problem Relation Age of Onset  . Heart disease Mother   . Diverticulitis Mother   . Heart disease Father   . Heart attack Father        x2  . Colon cancer Neg Hx   . Prostate cancer Neg Hx     Review of Systems: All other systems reviewed and are otherwise negative except as noted above.   Physical Exam: VS:  BP 120/70   Pulse 90   Ht 6' (1.829 m)   Wt 225 lb 9.6 oz (102.3 kg)   SpO2 97%   BMI 30.60 kg/m  , BMI Body mass index is 30.6 kg/m. Wt Readings from Last 3 Encounters:  01/09/17 225 lb 9.6 oz (102.3 kg)  12/09/16 218 lb 12.8 oz (99.2 kg)  11/27/16 220 lb (99.8 kg)    GEN- The patient is chronically ill appearing, alert and oriented x 3 today, +O2.   HEENT: normocephalic, atraumatic; sclera clear, conjunctiva pink; hearing intact; oropharynx clear; neck supple  Lungs- Clear to ausculation bilaterally, normal work of breathing.  No wheezes, rales, rhonchi Heart- Regular rate and rhythm  GI- soft, non-tender, non-distended, bowel sounds present  Extremities- no clubbing, cyanosis, or edema  MS- no significant deformity or atrophy Skin- warm and dry, no rash or lesion  Psych- euthymic mood, full affect Neuro- strength and sensation are intact   EKG:  EKG is ordered today. The ekg ordered today shows sinus rhythm  Recent Labs: 01/20/2016: B  Natriuretic Peptide 51.7; Magnesium 1.9 10/14/2016: TSH 2.44 11/14/2016: ALT 40; Hemoglobin 13.7 11/26/2016: BUN 14; Creatinine, Ser 0.67; Platelets 134; Potassium 4.0; Sodium 131    Other studies Reviewed: Additional studies/ records that were reviewed today include: hospital records   Assessment and Plan: 1.  Typical atrial flutter Doing well s/p ablation No procedural related complications Maintaining SR  Discontinue OAC today and restart ASA 81mg  daily.  CHADS2VASC is 3. If AF documented, will need to reconsider OAC.  He asked today about the possibility of developing other arrhythmias. We had a long discussion today about differences between atrial flutter and atrial fibrillation; procedure details regarding his flutter ablation; the possibility of developing atrial fibrillation.  He will check HR at home daily and call if HR elevated for EKG evaluation. He had gotten a Kardia monitor but is unable to use 2/2 rheumatoid arthritis.  2.  CAD No recent ischemic symptoms Continue current therapy   Current medicines are reviewed at length with the patient today.   The patient does not have concerns regarding his medicines.  The following changes were made today:  Stop Eliquis, start ASA 81mg  daily  Labs/ tests ordered today include:  Orders Placed This Encounter  Procedures  . EKG 12-Lead     Disposition:   Follow up with EP prn, Dr as scheduled      Signed, , NP 01/09/2017 9:59 AM   [facility] [address] [place] [phone] (office) [phone] (fax)

## 2017-01-09 ENCOUNTER — Ambulatory Visit (INDEPENDENT_AMBULATORY_CARE_PROVIDER_SITE_OTHER): Payer: BC Managed Care – PPO | Admitting: Nurse Practitioner

## 2017-01-09 ENCOUNTER — Encounter: Payer: Self-pay | Admitting: Nurse Practitioner

## 2017-01-09 VITALS — BP 120/70 | HR 90 | Ht 72.0 in | Wt 225.6 lb

## 2017-01-09 DIAGNOSIS — I483 Typical atrial flutter: Secondary | ICD-10-CM

## 2017-01-09 DIAGNOSIS — I2510 Atherosclerotic heart disease of native coronary artery without angina pectoris: Secondary | ICD-10-CM | POA: Diagnosis not present

## 2017-01-09 NOTE — Patient Instructions (Addendum)
Medication Instructions:  Your physician has recommended you make the following change in your medication: 1.) STOP Eliquis 2.) RESTART Aspirin 81 MG daily.   Labwork: None Ordered   Testing/Procedures: None Ordered   Follow-Up: Your physician recommends that you schedule a follow-up appointment in: September with Dr. Antoine Poche.  Any Other Special Instructions Will Be Listed Below (If Applicable).     If you need a refill on your cardiac medications before your next appointment, please call your pharmacy.  Thank you for choosing [facility]  Corrine CMA AAMA

## 2017-01-15 ENCOUNTER — Telehealth: Payer: Self-pay | Admitting: Pulmonary Disease

## 2017-01-15 NOTE — Telephone Encounter (Signed)
Pt is wanting a continuous flow POC- states that [employer] covers the equipment that he wants, but states he needs new qualifying sats in order to get this covered by insurance.  Pt scheduled Friday at 11:00 at pt request for qualifying walk test.  Nothing further needed at this time.

## 2017-01-17 ENCOUNTER — Ambulatory Visit: Payer: BC Managed Care – PPO

## 2017-01-22 ENCOUNTER — Telehealth: Payer: Self-pay | Admitting: Pulmonary Disease

## 2017-01-22 NOTE — Telephone Encounter (Signed)
Velvet Bathe, [place]  01/15/17 2:49 PM  Note    Pt is wanting a continuous flow POC- states that [employer] covers the equipment that he wants, but states he needs new qualifying sats in order to get this covered by insurance. Pt scheduled Friday at 11:00 at pt request for qualifying walk test. Nothing further needed at this time.    01/15/17 1:45 PM Osie Bond J routed this conversation to [facility] Triage [employer] C. Degracia  to Rosetta Posner   01/15/17 1:43 PM Patient states [employer] has Respironex that he wants but they are telling him he needs a walk test again. He has an appt 05/31 and wants to know if he can do this on the same day.

## 2017-01-22 NOTE — Telephone Encounter (Addendum)
Pt has appt with Dr Craige Cotta on 01/23/17 with qualifying saturations.  Pt requested this appt so that he could get a POC from [facility].  Pt was originally seen 12/09/16 with PM with qualifying saturations and an order to [facility] for POC.  Pt was unable to get POC through [facility] as they were not in his insurance network so therefor they needed to be switched to [facility]. I called and spoke with [facility] to see if anything further needed and if this appt on 01/23/17 was a duplicate appt and Barbara Cower stated that the patient refused services from [facility] d/t them not offering the type of POC the patient wanted. With the patients insurance, he has to have qualifying saturations done within a 30-day window of the delivery date of the POC so therefor the saturations that he has from 12/09/16 will not count toward any future orders as the cut off date was 01/08/17, however the OV with PM should be valid and he will not need another appt with VS, just qualifying saturations.  I called and spoke with Minerva Areola at [facility]) and was advised that the patient is not in their system and they have no record of speaking with this patient. Minerva Areola also stated that even if they had spoken with this patient he would not qualify for a POC with them until 90-days after being with their company on O2. Minerva Areola stated that they rarely do POCs any longer d/t cost and that patients normally are not approved for POCs through their company.  I called and spoke with patient wife-- states that she has been in communication with [employer] and has been speaking with dealing with [employer] already and has already received some services from their company.  We called (in conference call) [facility] [employer] and spoke with Minerva Areola again to figure out what is going on this patient and getting set up with [facility] [employer]. At the end of the conversation we established that the patient needs to keep OV with Dr Craige Cotta and get qualifying saturations once  Medicare goes into effect June 1. Patient's appt is going to have to be rescheduled to see Dr Craige Cotta with saturations after January 24, 2017. Pt scheduled to see Dr Craige Cotta on February 10, 2017 with qualifying saturations at that visit. Order will need to be placed at visit for new O2. Nothing further needed.

## 2017-01-23 ENCOUNTER — Ambulatory Visit: Payer: BC Managed Care – PPO | Admitting: Pulmonary Disease

## 2017-01-23 ENCOUNTER — Ambulatory Visit: Payer: BC Managed Care – PPO

## 2017-02-04 ENCOUNTER — Other Ambulatory Visit: Payer: Self-pay | Admitting: *Deleted

## 2017-02-04 MED ORDER — ALBUTEROL SULFATE HFA 108 (90 BASE) MCG/ACT IN AERS: 2.0000 | INHALATION_SPRAY | RESPIRATORY_TRACT | 3 refills | Status: DC | PRN

## 2017-02-04 NOTE — Telephone Encounter (Signed)
Fax received from [facility] requesting a medication change from Ventolin to ProairHFA or Proair Respiclick Medication changed to [employer] HFA. Nothing further needed.

## 2017-02-05 DIAGNOSIS — G894 Chronic pain syndrome: Secondary | ICD-10-CM | POA: Diagnosis not present

## 2017-02-05 DIAGNOSIS — M069 Rheumatoid arthritis, unspecified: Secondary | ICD-10-CM | POA: Diagnosis not present

## 2017-02-05 DIAGNOSIS — G4700 Insomnia, unspecified: Secondary | ICD-10-CM | POA: Diagnosis not present

## 2017-02-05 DIAGNOSIS — E1142 Type 2 diabetes mellitus with diabetic polyneuropathy: Secondary | ICD-10-CM | POA: Diagnosis not present

## 2017-02-10 ENCOUNTER — Ambulatory Visit (INDEPENDENT_AMBULATORY_CARE_PROVIDER_SITE_OTHER): Payer: Medicare HMO | Admitting: Pulmonary Disease

## 2017-02-10 ENCOUNTER — Other Ambulatory Visit (INDEPENDENT_AMBULATORY_CARE_PROVIDER_SITE_OTHER): Payer: Medicare HMO

## 2017-02-10 ENCOUNTER — Ambulatory Visit (INDEPENDENT_AMBULATORY_CARE_PROVIDER_SITE_OTHER)
Admission: RE | Admit: 2017-02-10 | Discharge: 2017-02-10 | Disposition: A | Discharge status: Home / Self Care | Payer: Medicare HMO | Source: Ambulatory Visit | Attending: Pulmonary Disease | Admitting: Pulmonary Disease

## 2017-02-10 ENCOUNTER — Encounter: Payer: Self-pay | Admitting: Pulmonary Disease

## 2017-02-10 VITALS — BP 112/70 | HR 91 | Ht 72.0 in | Wt 214.0 lb

## 2017-02-10 DIAGNOSIS — J439 Emphysema, unspecified: Secondary | ICD-10-CM

## 2017-02-10 DIAGNOSIS — R059 Cough, unspecified: Secondary | ICD-10-CM

## 2017-02-10 DIAGNOSIS — R509 Fever, unspecified: Secondary | ICD-10-CM | POA: Diagnosis not present

## 2017-02-10 DIAGNOSIS — J189 Pneumonia, unspecified organism: Secondary | ICD-10-CM

## 2017-02-10 DIAGNOSIS — J9611 Chronic respiratory failure with hypoxia: Secondary | ICD-10-CM | POA: Diagnosis not present

## 2017-02-10 DIAGNOSIS — R05 Cough: Secondary | ICD-10-CM

## 2017-02-10 LAB — COMPREHENSIVE METABOLIC PANEL
ALK PHOS: 85 U/L (ref 39–117)
ALT: 22 U/L (ref 0–53)
AST: 26 U/L (ref 0–37)
Albumin: 4.1 g/dL (ref 3.5–5.2)
BILIRUBIN TOTAL: 1.5 mg/dL — AB (ref 0.2–1.2)
BUN: 12 mg/dL (ref 6–23)
CALCIUM: 9.8 mg/dL (ref 8.4–10.5)
CO2: 26 mEq/L (ref 19–32)
Chloride: 99 mEq/L (ref 96–112)
Creatinine, Ser: 0.74 mg/dL (ref 0.40–1.50)
GFR: 112.81 mL/min (ref >60.00)
Glucose, Bld: 201 mg/dL — ABNORMAL HIGH (ref 70–99)
Potassium: 3.8 mEq/L (ref 3.5–5.1)
Sodium: 134 mEq/L — ABNORMAL LOW (ref 135–145)
TOTAL PROTEIN: 7.5 g/dL (ref 6.0–8.3)

## 2017-02-10 LAB — CBC WITH DIFFERENTIAL/PLATELET
Basophils Absolute: 0 10*3/uL (ref 0.0–0.1)
Basophils Relative: 0.6 % (ref 0.0–3.0)
EOS PCT: 0.4 % (ref 0.0–5.0)
Eosinophils Absolute: 0 10*3/uL (ref 0.0–0.7)
HEMATOCRIT: 43 % (ref 39.0–52.0)
Hemoglobin: 14.8 g/dL (ref 13.0–17.0)
LYMPHS PCT: 38.3 % (ref 12.0–46.0)
Lymphs Abs: 1.7 10*3/uL (ref 0.7–4.0)
MCHC: 34.4 g/dL (ref 30.0–36.0)
MCV: 89.7 fl (ref 78.0–100.0)
MONOS PCT: 20.9 % — AB (ref 3.0–12.0)
Monocytes Absolute: 0.9 10*3/uL (ref 0.1–1.0)
NEUTROS ABS: 1.7 10*3/uL (ref 1.4–7.7)
Neutrophils Relative %: 39.8 % — ABNORMAL LOW (ref 43.0–77.0)
PLATELETS: 150 10*3/uL (ref 150.0–400.0)
RBC: 4.79 Mil/uL (ref 4.22–5.81)
RDW: 16.4 % — ABNORMAL HIGH (ref 11.5–15.5)
WBC: 4.4 10*3/uL (ref 4.0–10.5)

## 2017-02-10 MED ORDER — LEVOFLOXACIN 500 MG PO TABS: 500.0000 mg | ORAL_TABLET | Freq: Every day | ORAL | 0 refills | Status: DC

## 2017-02-10 NOTE — Patient Instructions (Signed)
Levaquin 500 mg daily for 7 days Call by end of the week if you are not feeling better Check if your insurance plan covers trelegy >> this would replace advair and spiriva Call when you are feeling better and will reschedule qualifying oxygen test  Follow up in 6 months

## 2017-02-10 NOTE — Progress Notes (Signed)
Current Outpatient Prescriptions on File Prior to Visit  Medication Sig  . ADVAIR DISKUS 100-50 MCG/DOSE AEPB INHALE 1 PUFF INTO THE LUNGS TWICE DAILY  . albuterol (PROAIR HFA) 108 (90 Base) MCG/ACT inhaler Inhale 2 puffs into the lungs every 4 (four) hours as needed for wheezing or shortness of breath.  . Ascorbic Acid (VITAMIN C) 500 MG tablet Take 500 mg by mouth daily.    Marland Kitchen atorvastatin (LIPITOR) 80 MG tablet TAKE 1 TABLET(80 MG) BY MOUTH DAILY  . B-D TB SYRINGE 1CC/27GX1/2" 27G X 1/2" 1 ML MISC Reported on 02/16/2016  . Carbonyl Iron (PERFECT IRON PO) Take 1 tablet by mouth every morning.   . clobetasol cream (TEMOVATE) 0.05 % Apply 1 application topically 2 (two) times daily.  . folic acid (FOLVITE) 800 MCG tablet Take 800 mcg by mouth daily.  . furosemide (LASIX) 20 MG tablet TAKE 2 TABLETS BY MOUTH EVERY MORNING AND TAKE 1 TABLET EVERY EVENING AT 4:00PM  . hydrocortisone cream 1 % Apply 1 application topically 3 (three) times daily as needed for itching (skin irritation).  . Lancets (ONETOUCH ULTRASOFT) lancets USE TO TEST BLOOD SUGAR NO MORE THAN TWICE DAILY  . Loteprednol Etabonate (LOTEMAX) 0.5 % GEL Place 1 drop into both eyes every morning. daily  . methotrexate 50 MG/2ML injection Inject 25 mg into the muscle every Thursday.   . metoprolol tartrate (LOPRESSOR) 25 MG tablet TAKE 1 TABLET(25 MG) BY MOUTH TWICE DAILY  . Misc. Devices (ACAPELLA) MISC Use as directed  . nitroGLYCERIN (NITROSTAT) 0.4 MG SL tablet Place 1 tablet (0.4 mg total) under the tongue every 5 (five) minutes as needed for chest pain (up to 3 doses for severe chest pain. If no relief after 3rd dose, proceed to the ED for an evaluation).  . NON FORMULARY EVERGO PORTABLE PULSE DOSE OXYGEN oxygen 3L per nasal cannula 24 hours a day Dx: 496  . ONE TOUCH ULTRA TEST test strip USE TO TEST BLOOD SUGAR NO MORE THAN TWICE DAILY  . oxyCODONE-acetaminophen (PERCOCET) 7.5-325 MG per tablet Take 1 tablet by mouth 3 (three)  times daily. 3 times a day  . pantoprazole (PROTONIX) 40 MG tablet Take 1 tablet (40 mg total) by mouth 2 (two) times daily.  . potassium chloride (K-DUR) 10 MEQ tablet TAKE 4 TABLETS BY MOUTH EVERY MORNING AND TAKE 2 TABLETS BY MOUTH EVERY EVENING  . predniSONE (DELTASONE) 5 MG tablet Take 5 mg by mouth daily. Take as directed  . Respiratory Therapy Supplies (FLUTTER) DEVI 1 Inhaler by Does not apply route 2 (two) times daily.  Marland Kitchen SPIRIVA HANDIHALER 18 MCG inhalation capsule INHALE 1 CAPSULE BY MOUTH IN HANDIHALER DAILY  . vitamin B-12 (CYANOCOBALAMIN) 250 MCG tablet Take 250 mcg by mouth daily.    . [DISCONTINUED] omeprazole (PRILOSEC OTC) 20 MG tablet Take 1 tablet (20 mg total) by mouth daily.   No current facility-administered medications on file prior to visit.      Chief Complaint  Patient presents with  . Follow-up    Pt states that he was supposed to be having the walk today to see if still qualifies for home oxygen. Pt's wife just got back from [place] 02/03/17 and when she got home, pt caught something from her.  Complains of productive cough with medium to dark green mucus, has been much darker the past couple of days. Denies SOB, does have some pain in chest due to the productive cough.     Pulmonary tests PFT 01/12/09 >>  FEV1 3.02 (85%), FEV1% 68, TLC 6.35 (88%), DLCO 45%, no BD PFT 12/02/12 >> FEV1 3.11 (78%), FEV1% 77, TLC 6.32 (83%), DLCO 37%, no BD  CT chests CT chest 10/17/12 >> severe centrilobular emphysema, apical bullae, 1 cm nodule LUL new, 5 mm nodule RLL stable since 2009, BTX with GGO at bases CT chest 05/06/13 >> decreased size LUL nodule now 8 mm, 4 mm nodule RLL, 3 mm nodule RML, 7 mm nodule RML, 8 mm nodule lingula CT chest 04/05/14 >> atherosclerosis, moderate diffuse centrilobular and paraseptal emphysema, stable pulmonary nodules with dominant LUL nodule now 7 mm, steatosis of liver with ?early cirrhosis CT chest 05/02/15 >> stable pulmonary nodules since  2009, severe emphysema CT angio chest 01/19/16 >> bullous emphysema CT angio chest 02/23/16 >> new patchy ASD Rt > Lt lower lobes  Cardiac tests Echo 01/21/16 >> EF 50 to 55%, grade 1 DD, mild LVH, mild AS, mild RV systolic dysfx  Past medical history Rheumatoid arthritis, psoriasis, HTN, GERD, HH, Gastroparesis, CAD s/p CABG 2007, Osteoporosis, Neuropathy, DM  Past surgical history, Family history, Social history, Allergies reviewed.  Vital Signs BP 112/70 (BP Location: Left Arm, Cuff Size: Normal)   Pulse 91   Ht 6' (1.829 m)   Wt 214 lb (97.1 kg)   SpO2 97%   BMI 29.02 kg/m   History of Present Illness Joseph [name] is a 66 y.o. male former smoker with COPD/emphysema, rheumatoid arthritis, recurrent pneumonia 2nd to chronic aspiration/reflux, bronchiectasis, chronic respiratory failure on 3 liters oxygen 24/7.  His wife went to [place] early this month.  She developed a respiratory infection.  He caught this.  He has temperature up to 100F.  He is feeling sweaty and fatigued.  He doesn't have much of an appetite.  He is coughing with green sputum.  Denies sinus congestion, hemoptysis, skin rash, or leg swelling.  Physical Exam  General - wearing, oxygen Eyes - pupils reactive ENT - no sinus tenderness, no oral exudate, no LAN Cardiac - regular, no murmur Chest - no wheeze, rales Abd - soft, non tender Ext - extensive changes of RA Skin - no rashes Neuro - normal strength Psych - normal mood   Dg Chest 2 View  Result Date: 02/10/2017 CLINICAL DATA:  66 year old presenting with two-day history of productive cough and fever. Current history of COPD and diabetes. Former smoker. EXAM: CHEST  2 VIEW COMPARISON:  10/02/2016, 08/20/2016 and earlier, including CTA chest 02/23/2016. FINDINGS: Prior sternotomy CABG. Cardiac silhouette mildly enlarged, unchanged. Thoracic aorta atherosclerotic, unchanged. Hilar and mediastinal contours otherwise unremarkable. Emphysematous changes  throughout both lungs with large blebs in the upper lobes bilateral. Prominent bronchovascular markings diffusely and central peribronchial thickening, unchanged, indicating chronic bronchitis. No new pulmonary parenchymal abnormalities. No confluent airspace consolidation. No pleural effusions. Normal pulmonary vascularity. Mild degenerative changes involving the thoracic and upper lumbar spine. IMPRESSION: Stable mild cardiomegaly. COPD/emphysema. No acute cardiopulmonary disease. Electronically Signed   By: Hulan Saas M.D.   On: 02/10/2017 14:26    CBC Latest Ref Rng & Units 02/10/2017 11/26/2016 11/14/2016  WBC 4.0 - 10.5 K/uL 4.4 4.0 4.2  Hemoglobin 13.0 - 17.0 g/dL 49.7 02.6 37.8  Hematocrit 39.0 - 52.0 % 43.0 39.9 41.0  Platelets 150.0 - 400.0 K/uL 150.0 134(L) 157.0    CMP Latest Ref Rng & Units 02/10/2017 11/26/2016 11/14/2016  Glucose 70 - 99 mg/dL 588(F) 027(X) 412(I)  BUN 6 - 23 mg/dL 12 14 15   Creatinine 0.40 - 1.50 mg/dL  0.74 0.67(L) 0.79  Sodium 135 - 145 mEq/L 134(L) 131(L) 138  Potassium 3.5 - 5.1 mEq/L 3.8 4.0 4.1  Chloride 96 - 112 mEq/L 99 98 104  CO2 19 - 32 mEq/L 26 26 28   Calcium 8.4 - 10.5 mg/dL 9.8 9.4 9.5  Total Protein 6.0 - 8.3 g/dL 7.5 - 6.9  Total Bilirubin 0.2 - 1.2 mg/dL ) - 0.8  Alkaline Phos 39 - 117 U/L 85 - 84  AST 0 - 37 U/L 26 - 30  ALT 0 - 53 U/L 22 - 40     Assessment/Plan  Fever, cough from acute bronchitis. - no evidence for pneumonia on CXR - will give him course of levaquin  COPD with bullous emphysema. - continue spiriva, advair, and prn albuterol - he will check with his insurance about whether trelegy is covered alternative  Bronchiectasis. - continue bronchial hygiene  Recurrent aspiration PNA with hx of GERD, HH, and gastroparesis. - continue bid protonix per GI  Chronic respiratory failure. - Related to COPD/Emphysema. - continue supplemental oxygen - will need to reschedule qualifying oxygen test when he has recovered  from respiratory infection  Hx of Rheumatoid arthritis. - he is on MTX, and chronic prednisone per rheumatology, Dr. 9.7(L at [facility]    Patient Instructions  Levaquin 500 mg daily for 7 days Call by end of the week if you are not feeling better Check if your insurance plan covers trelegy >> this would replace advair and spiriva Call when you are feeling better and will reschedule qualifying oxygen test  Follow up in 6 months    [facility], MD [place] [facility] Pager:  [phone] 02/10/2017, 4:10 PM

## 2017-02-12 ENCOUNTER — Telehealth: Payer: Self-pay | Admitting: Pulmonary Disease

## 2017-02-12 DIAGNOSIS — R17 Unspecified jaundice: Secondary | ICD-10-CM

## 2017-02-12 MED ORDER — LEVOFLOXACIN 500 MG PO TABS: 500.0000 mg | ORAL_TABLET | Freq: Every day | ORAL | 0 refills | Status: DC

## 2017-02-12 NOTE — Telephone Encounter (Signed)
Can associate lab test with elevated bilirubin (ICD 10 code R17).  Can send in script for 1 extra dose of levaquin.

## 2017-02-12 NOTE — Telephone Encounter (Signed)
LFT has been ordered.  Rx for 1 tab of Levaquin 500 has been sent to preferred pharmacy. Pt is aware and voiced his understanding. Nothing further needed.

## 2017-02-12 NOTE — Telephone Encounter (Signed)
VS  Please Advise-  Spoke with pt about your message. Pt agreed to the repeat lab. What would you like [place] to associate this with? Also pt states he had a mix up in his medication and accidentally took two of his Levaquin on the same day and he stated he is not having any reactions and has already spoken with the pharmacy to make sure he is ok but he did not know if he needed 1 more pill called in since he will be a day short. He stated he is still having the chest discomfort when he coughs, and he is still coughing up dark green phlegm, Denies fever.   No Known Allergies

## 2017-02-12 NOTE — Telephone Encounter (Signed)
CXR did not show pneumonia.  Labs okay except for mild elevation in bilirubin.  If he is not having any abdominal pain, then he just needs f/u LFTs in 1 week.

## 2017-02-12 NOTE — Telephone Encounter (Signed)
Pt is requesting his lab and CXR results from 02/10/17.  VS - please advise. Thanks.

## 2017-02-13 DIAGNOSIS — H25041 Posterior subcapsular polar age-related cataract, right eye: Secondary | ICD-10-CM | POA: Diagnosis not present

## 2017-02-13 DIAGNOSIS — E113293 Type 2 diabetes mellitus with mild nonproliferative diabetic retinopathy without macular edema, bilateral: Secondary | ICD-10-CM | POA: Diagnosis not present

## 2017-02-13 DIAGNOSIS — H04123 Dry eye syndrome of bilateral lacrimal glands: Secondary | ICD-10-CM | POA: Diagnosis not present

## 2017-02-13 DIAGNOSIS — H2513 Age-related nuclear cataract, bilateral: Secondary | ICD-10-CM | POA: Diagnosis not present

## 2017-02-21 ENCOUNTER — Other Ambulatory Visit: Payer: Self-pay

## 2017-02-21 MED ORDER — PANTOPRAZOLE SODIUM 40 MG PO TBEC: 40.0000 mg | DELAYED_RELEASE_TABLET | Freq: Two times a day (BID) | ORAL | 3 refills | Status: DC

## 2017-02-27 ENCOUNTER — Ambulatory Visit (INDEPENDENT_AMBULATORY_CARE_PROVIDER_SITE_OTHER): Payer: Medicare HMO | Admitting: *Deleted

## 2017-02-27 ENCOUNTER — Telehealth: Payer: Self-pay | Admitting: Pulmonary Disease

## 2017-02-27 DIAGNOSIS — R0602 Shortness of breath: Secondary | ICD-10-CM | POA: Diagnosis not present

## 2017-02-27 DIAGNOSIS — J9611 Chronic respiratory failure with hypoxia: Secondary | ICD-10-CM

## 2017-02-27 NOTE — Telephone Encounter (Signed)
Pt came in for a nurse visit today for his qualifying O2 sats and is requesting to go ahead and switch to Trelegy.  Please advise if previous plan still stands to switch and d/c Advair/Spiriva. Thanks.   Patient Instructions  Levaquin 500 mg daily for 7 days Call by end of the week if you are not feeling better Check if your insurance plan covers trelegy >> this would replace advair and spiriva Call when you are feeling better and will reschedule qualifying oxygen test  ------ Patient did not qualify for O2.  Pt walked on Room Air - 5 laps around the office and lowest O2 sat was 96% with the forehead probe.  Last ambulatory walk was done while patient was in the middle of an exacerbation back in April 2018 and pt desatted to 87%. Pt very exhausted and SOB at the end of today's walk.   Will send to Dr Craige Cotta as Lorain Childes. Pt is asking what he is supposed to do now since he is Medicare and does not qualify for the O2.

## 2017-02-27 NOTE — Progress Notes (Signed)
Pt in office for qualifying walk.   

## 2017-02-28 ENCOUNTER — Telehealth: Payer: Self-pay | Admitting: Pulmonary Disease

## 2017-02-28 MED ORDER — FLUTICASONE-UMECLIDIN-VILANT 100-62.5-25 MCG/INH IN AEPB: 1.0000 | INHALATION_SPRAY | Freq: Every day | RESPIRATORY_TRACT | 4 refills | Status: DC

## 2017-02-28 NOTE — Telephone Encounter (Signed)
Okay to bring him in for 6 minute walk test.

## 2017-02-28 NOTE — Telephone Encounter (Signed)
Please send script for trelegy, and d/c spiriva and advair.  He can get room air ABG to determine if this qualifies him for home oxygen.

## 2017-02-28 NOTE — Telephone Encounter (Signed)
Spoke with patient and his wife about VS' recs. They verbalized understanding. Trelegy has been sent into pharmacy and the ABG has been ordered.   Advair and Spiriva have been taken off of his medication list due to change in therapy.

## 2017-02-28 NOTE — Telephone Encounter (Signed)
Called and spoke with pt and he stated that he did the 6 min walk test on 7/5 and he stated that the forehead probe was used.  He stated that he was told that his oxygen did not go below 96% and he stated that by the 3rd lap he was out of breath and struggling to breath and he was tired.  He stated that the nurse said his oxygen was still 96% and he feels that there is no way that this is correct.    Pt is wanting to come in to repeat this test and would like to do the test with the finger probe and the forehead probe to compare the 2. Pt is very worried that the results of this test will have a negative impact on his health and the fact that he has been on oxygen for 10 years.   VS please advise. Thanks

## 2017-02-28 NOTE — Telephone Encounter (Signed)
See phone note from 7/5. 

## 2017-03-03 ENCOUNTER — Telehealth: Payer: Self-pay | Admitting: Internal Medicine

## 2017-03-03 NOTE — Telephone Encounter (Signed)
Pt's spouse dropped off document to be filled out (Document from [facility] 2 pages- pt would like to have it fax to [phone]). Document given to [facility]. Pt has an appt for CPE on March 06, 2017. Please verify if neccessary for pt to have a CPE done for the  documents to be filled out, please let pt know, if not pt would canceled the appt for this Thursday July 12.2018. Please advise.

## 2017-03-03 NOTE — Telephone Encounter (Signed)
Form received. Appt 03/06/2017 for form completion.

## 2017-03-04 ENCOUNTER — Ambulatory Visit (HOSPITAL_COMMUNITY)
Admission: RE | Admit: 2017-03-04 | Discharge: 2017-03-04 | Disposition: A | Discharge status: Home / Self Care | Payer: Medicare HMO | Source: Ambulatory Visit | Attending: Pulmonary Disease | Admitting: Pulmonary Disease

## 2017-03-04 DIAGNOSIS — J9611 Chronic respiratory failure with hypoxia: Secondary | ICD-10-CM | POA: Diagnosis not present

## 2017-03-04 LAB — BLOOD GAS, ARTERIAL
Acid-base deficit: 1.1 mmol/L (ref 0.0–2.0)
Bicarbonate: 22.8 mmol/L (ref 20.0–28.0)
DRAWN BY: 270211
FIO2: 0.21
O2 Saturation: 95.4 %
PATIENT TEMPERATURE: 98.6
PH ART: 7.401 (ref 7.350–7.450)
pCO2 arterial: 37.5 mmHg (ref 32.0–48.0)
pO2, Arterial: 79.7 mmHg — ABNORMAL LOW (ref 83.0–108.0)

## 2017-03-04 MED ORDER — METHOTREXATE SODIUM 25 MG/ML INJECTION SOLUTION
3 refills | 0 days | Status: CP
Start: 2017-03-04 — End: 2017-06-26

## 2017-03-06 ENCOUNTER — Ambulatory Visit (INDEPENDENT_AMBULATORY_CARE_PROVIDER_SITE_OTHER): Payer: Medicare HMO | Admitting: Internal Medicine

## 2017-03-06 ENCOUNTER — Telehealth: Payer: Self-pay | Admitting: Pulmonary Disease

## 2017-03-06 ENCOUNTER — Encounter: Payer: Self-pay | Admitting: Internal Medicine

## 2017-03-06 VITALS — BP 126/74 | HR 67 | Temp 97.6°F | Resp 16 | Ht 72.0 in | Wt 218.0 lb

## 2017-03-06 DIAGNOSIS — I483 Typical atrial flutter: Secondary | ICD-10-CM | POA: Diagnosis not present

## 2017-03-06 DIAGNOSIS — M059 Rheumatoid arthritis with rheumatoid factor, unspecified: Secondary | ICD-10-CM | POA: Diagnosis not present

## 2017-03-06 DIAGNOSIS — Z09 Encounter for follow-up examination after completed treatment for conditions other than malignant neoplasm: Secondary | ICD-10-CM

## 2017-03-06 DIAGNOSIS — Z01818 Encounter for other preprocedural examination: Secondary | ICD-10-CM | POA: Diagnosis not present

## 2017-03-06 DIAGNOSIS — R0609 Other forms of dyspnea: Secondary | ICD-10-CM

## 2017-03-06 DIAGNOSIS — R0600 Dyspnea, unspecified: Secondary | ICD-10-CM

## 2017-03-06 DIAGNOSIS — E1140 Type 2 diabetes mellitus with diabetic neuropathy, unspecified: Secondary | ICD-10-CM | POA: Diagnosis not present

## 2017-03-06 LAB — COMPREHENSIVE METABOLIC PANEL
ALK PHOS: 77 U/L (ref 39–117)
ALT: 27 U/L (ref 0–53)
AST: 29 U/L (ref 0–37)
Albumin: 3.9 g/dL (ref 3.5–5.2)
BUN: 16 mg/dL (ref 6–23)
CHLORIDE: 101 meq/L (ref 96–112)
CO2: 26 meq/L (ref 19–32)
Calcium: 9.8 mg/dL (ref 8.4–10.5)
Creatinine, Ser: 0.7 mg/dL (ref 0.40–1.50)
GFR: 120.26 mL/min (ref >60.00)
GLUCOSE: 134 mg/dL — AB (ref 70–99)
POTASSIUM: 4.1 meq/L (ref 3.5–5.1)
SODIUM: 138 meq/L (ref 135–145)
TOTAL PROTEIN: 7.2 g/dL (ref 6.0–8.3)
Total Bilirubin: 0.9 mg/dL (ref 0.2–1.2)

## 2017-03-06 LAB — CBC WITH DIFFERENTIAL/PLATELET
BASOS PCT: 0.5 % (ref 0.0–3.0)
Basophils Absolute: 0 10*3/uL (ref 0.0–0.1)
EOS PCT: 2.6 % (ref 0.0–5.0)
Eosinophils Absolute: 0.1 10*3/uL (ref 0.0–0.7)
HCT: 40.5 % (ref 39.0–52.0)
Hemoglobin: 13.9 g/dL (ref 13.0–17.0)
LYMPHS ABS: 1.8 10*3/uL (ref 0.7–4.0)
Lymphocytes Relative: 44.5 % (ref 12.0–46.0)
MCHC: 34.3 g/dL (ref 30.0–36.0)
MCV: 89.9 fl (ref 78.0–100.0)
Monocytes Absolute: 0.7 10*3/uL (ref 0.1–1.0)
NEUTROS PCT: 33.8 % — AB (ref 43.0–77.0)
Neutro Abs: 1.3 10*3/uL — ABNORMAL LOW (ref 1.4–7.7)
Platelets: 147 10*3/uL — ABNORMAL LOW (ref 150.0–400.0)
RBC: 4.5 Mil/uL (ref 4.22–5.81)
RDW: 16.2 % — AB (ref 11.5–15.5)
WBC: 4 10*3/uL (ref 4.0–10.5)

## 2017-03-06 LAB — HEMOGLOBIN A1C: Hgb A1c MFr Bld: 6.2 % (ref 4.6–6.5)

## 2017-03-06 NOTE — Progress Notes (Addendum)
Subjective:    Patient ID: Joseph [name], male    DOB: June 16, 1951, 66 y.o.   MRN: [id number]  DOS:  03/06/2017 Type of visit - description : Surgical clearance Interval history: Patient to have right cataract surgery, his eye  doctor request me to complete a form. A flutter: Chart reviewed, seems to be doing well COPD: Was seen with fever by pulmonology, treated with Levaquin. Now back to baseline. Diabetes: Due for a A1c Medications reviewed: Good compliance without apparent side effects.   Review of Systems No fever chills No chest pain, lower extremity edema or palpitations. Cough and DOE at baseline.  Past Medical History:  Diagnosis Date  . CAD (coronary artery disease)    MI 2007, CABG  . Cataracts, bilateral   . Complication of anesthesia    DIFFICULTY AFTER CABG HAD CONFUSION POST OP  . COPD (chronic obstructive pulmonary disease) (HCC)   . Diabetes mellitus    Dx 07-2008 A1C 6.2  . Esophageal dysmotility   . Fatty liver 10/11/09  . Gastroesophageal reflux disease   . Gastroparesis   . Hiatal hernia   . Hypertension   . Neuropathy    (related to RA per neuro note 05-2011)  . Onychomycosis   . Osteoporosis    per DEXA 05-2008  . Oxygen dependent    3l  . Peripheral polyneuropathy    Severe, causing problems with his feet, see surgeries  . Psoriasis   . Rheumatoid arthritis [facility])    [facility] Rheumatology    Past Surgical History:  Procedure Laterality Date  . A-FLUTTER ABLATION N/A 11/27/2016   Procedure: A-Flutter Ablation;  Surgeon: Marinus Maw, MD;  Location: [facility] INVASIVE CV LAB;  Service: Cardiovascular;  Laterality: N/A;  . achiles tendon surgery  8/09  . CARDIAC CATHETERIZATION N/A 01/23/2016   Procedure: Left Heart Cath and Cors/Grafts Angiography;  Surgeon: Lyn Records, MD;  Location: [facility] INVASIVE CV LAB;  Service: Cardiovascular;  Laterality: N/A;  . CARDIOVERSION N/A 10/23/2016   Procedure: CARDIOVERSION;  Surgeon: Rollene Rotunda, MD;  Location: [facility]  ENDOSCOPY;  Service: Cardiovascular;  Laterality: N/A;  . CHOLECYSTECTOMY    . CORONARY ARTERY BYPASS GRAFT  2007   (LIMA to the LAD, left radial to obtuse marginal, SVG to first diagonal, SVG to PDA. His ejection fraction to 50-55%  . ESOPHAGOGASTRODUODENOSCOPY N/A 10/19/2012   Procedure: ESOPHAGOGASTRODUODENOSCOPY (EGD);  Surgeon: Hart Carwin, MD;  Location: [facility] ENDOSCOPY;  Service: Endoscopy;  Laterality: N/A;  the dilatation is possible.   Marland Kitchen FOOT SURGERY     to tendon release and repair of osteomyelitis   . LARYNGOSCOPY N/A 01/21/2014   Procedure: LARYNGOSCOPY;  Surgeon: Christia Reading, MD;  Location: [facility] OR;  Service: ENT;  Laterality: N/A;  micro direct laryngoscopy with prolaryn injection/jet venturi ventilation  . TOE AMPUTATION  4-12/ 3 14   d/t a deformity, found to have osteomyelitis, complicated by post-op foot strtess FX    Social History   Social History  . Marital status: Married    Spouse name: N/A  . Number of children: 0  . Years of education: N/A   Occupational History  . Disabled-retired Unemployed   Social History Main Topics  . Smoking status: Former Smoker    Packs/day: 1.00    Years: 40.00    Quit date: 08/26/2005  . Smokeless tobacco: Former Neurosurgeon    Quit date: 04/02/2006     Comment: smoked x 40 years, 1 ppd  . Alcohol use No  .  Drug use: No  . Sexual activity: Not on file   Other Topics Concern  . Not on file   Social History Narrative  . No narrative on file      Allergies as of 03/06/2017   No Known Allergies     Medication List       Accurate as of 03/06/17 11:59 PM. Always use your most recent med list.          ACAPELLA Misc Use as directed   albuterol 108 (90 Base) MCG/ACT inhaler Commonly known as:  PROAIR HFA Inhale 2 puffs into the lungs every 4 (four) hours as needed for wheezing or shortness of breath.   atorvastatin 80 MG tablet Commonly known as:  LIPITOR TAKE 1 TABLET(80 MG) BY MOUTH DAILY   B-D TB SYRINGE 1CC/27GX1/2"  27G X 1/2" 1 ML Misc Generic drug:  TUBERCULIN SYR 1CC/27GX1/2" Reported on 02/16/2016   clobetasol cream 0.05 % Commonly known as:  TEMOVATE Apply 1 application topically 2 (two) times daily.   Fluticasone-Umeclidin-Vilant 100-62.5-25 MCG/INH Aepb Commonly known as:  TRELEGY ELLIPTA Inhale 1 puff into the lungs daily.   FLUTTER Devi 1 Inhaler by Does not apply route 2 (two) times daily.   folic acid 800 MCG tablet Commonly known as:  FOLVITE Take 800 mcg by mouth daily.   furosemide 20 MG tablet Commonly known as:  LASIX TAKE 2 TABLETS BY MOUTH EVERY MORNING AND TAKE 1 TABLET EVERY EVENING AT 4:00PM   hydrocortisone cream 1 % Apply 1 application topically 3 (three) times daily as needed for itching (skin irritation).   LOTEMAX 0.5 % Gel Generic drug:  Loteprednol Etabonate Place 1 drop into both eyes every morning. daily   methotrexate 50 MG/2ML injection Inject 25 mg into the muscle every Thursday.   metoprolol tartrate 25 MG tablet Commonly known as:  LOPRESSOR TAKE 1 TABLET(25 MG) BY MOUTH TWICE DAILY   nitroGLYCERIN 0.4 MG SL tablet Commonly known as:  NITROSTAT Place 1 tablet (0.4 mg total) under the tongue every 5 (five) minutes as needed for chest pain (up to 3 doses for severe chest pain. If no relief after 3rd dose, proceed to the ED for an evaluation).   NON FORMULARY EVERGO PORTABLE PULSE DOSE OXYGEN oxygen 3L per nasal cannula 24 hours a day Dx: 496   ONE TOUCH ULTRA TEST test strip Generic drug:  glucose blood USE TO TEST BLOOD SUGAR NO MORE THAN TWICE DAILY   onetouch ultrasoft lancets USE TO TEST BLOOD SUGAR NO MORE THAN TWICE DAILY   oxyCODONE-acetaminophen 7.5-325 MG tablet Commonly known as:  PERCOCET Take 1 tablet by mouth 3 (three) times daily. 3 times a day   pantoprazole 40 MG tablet Commonly known as:  PROTONIX Take 1 tablet (40 mg total) by mouth 2 (two) times daily.   PERFECT IRON PO Take 1 tablet by mouth every morning.     potassium chloride 10 MEQ tablet Commonly known as:  K-DUR TAKE 4 TABLETS BY MOUTH EVERY MORNING AND TAKE 2 TABLETS BY MOUTH EVERY EVENING   predniSONE 5 MG tablet Commonly known as:  DELTASONE Take 5 mg by mouth daily. Take as directed   vitamin B-12 250 MCG tablet Commonly known as:  CYANOCOBALAMIN Take 250 mcg by mouth daily.   vitamin C 500 MG tablet Commonly known as:  ASCORBIC ACID Take 500 mg by mouth daily.          Objective:   Physical Exam BP 126/74 (BP Location: Left  Arm, Patient Position: Sitting, Cuff Size: Normal)   Pulse 67   Temp 97.6 F (36.4 C) (Oral)   Resp 16   Ht 6' (1.829 m)   Wt 218 lb (98.9 kg)   SpO2 96%   BMI 29.57 kg/m  General:   Well developed, well nourished . NAD.  HEENT:  Normocephalic . Face symmetric, atraumatic Lungs:  CTA B Normal respiratory effort, no intercostal retractions, no accessory muscle use. Heart: Regular?,  no murmur.  No pretibial edema bilaterally  Skin: Not pale. Not jaundice Neurologic:  alert & oriented X3.  Speech normal, gait at baseline and quite limited by RA. Psych--  Cognition and judgment appear intact.  Cooperative with normal attention span and concentration.  Behavior appropriate. No anxious or depressed appearing.      Assessment & Plan:  Assessment  MSK: --RA --- Dr Sharren Bridge --Psoriasis --Osteoporosis Peripheral neuropathy, severe, several feet surgeries DM HTN Hyperlipidemia Pulmonary: --COPD --Pulmonary fibrosis --O2 24/ 7 --Chronic respiratory failure CV: --CAD, CHF, MI and  CABG 2007 --CP- CT chest no PE, cath 01-23-16, one occluded graft, rx medical mngmt  dx: question of pericarditis  --Pericarditis, admitted 01/2016 --New  onset A. Flutter 09-2016, admitted. Ablation 4-18, 12-2016: d/c OAC, rx asa 81; restart OAC if flutter return GI --Barrettts --GERD --esophageal dysmotility --Fatty liver Hoarseness , chronic, saw ENT , s/p botox 2015 , helped tempoarrily    PLAN DM: Diet control, check A1c. Rheumatoid arthritis: Recent labs normal, patient request to recheck a CMP, CBC, rheumatoid factor. Will do. Will fax to rheumatology. COPD: Currently at baseline A flutter: Had an ablation in April 2018 , saw cardiology in May, was back on NST,  anticoagulants dc, rx aspirin 81. He is asx, EKGs show bigeminy. Will discuss with cardiology. Addendum: Bigeminy okay as long as patient is asymptomatic. Cataract surgery:  will clear once I discussed the EKG w/ cards .  RTC, CPX, 4 months

## 2017-03-06 NOTE — Patient Instructions (Addendum)
GO TO THE LAB : Get the blood work     GO TO THE FRONT DESK Schedule your next appointment for a  physical exam in 4 months.  Schedule Medicare wellness  with my nurse at your convenience

## 2017-03-06 NOTE — Progress Notes (Signed)
Pre visit review using our clinic review tool, if applicable. No additional management support is needed unless otherwise documented below in the visit note. 

## 2017-03-06 NOTE — Telephone Encounter (Signed)
LM for patient x 1 

## 2017-03-06 NOTE — Telephone Encounter (Signed)
ABG    Component Value Date/Time   PHART 7.401 03/04/2017 1130   PCO2ART 37.5 03/04/2017 1130   PO2ART 79.7 (L) 03/04/2017 1130   HCO3 22.8 03/04/2017 1130   TCO2 21 10/17/2012 1123   ACIDBASEDEF 1.1 03/04/2017 1130   O2SAT 95.4 03/04/2017 1130     Please inform patient that based on blood test results he doesn't qualify for daytime oxygen therapy at rest.  He should be scheduled for 6 MWT.  Will determine if this qualifies him for oxygen therapy with exertion.

## 2017-03-07 ENCOUNTER — Telehealth: Payer: Self-pay | Admitting: Internal Medicine

## 2017-03-07 LAB — RHEUMATOID FACTOR

## 2017-03-07 NOTE — Assessment & Plan Note (Addendum)
DM: Diet control, check A1c. Rheumatoid arthritis: Recent labs normal, patient request to recheck a CMP, CBC, rheumatoid factor. Will do. Will fax to rheumatology. COPD: Currently at baseline A flutter: Had an ablation in April 2018 , saw cardiology in May, was back on NST,  anticoagulants dc, rx aspirin 81. He is asx, EKGs show bigeminy. Will discuss with cardiology. Addendum: Bigeminy okay as long as patient is asymptomatic. Cataract surgery:  will clear once I discussed the EKG w/ cards .  RTC, CPX, 4 months

## 2017-03-07 NOTE — Telephone Encounter (Signed)
Follow Up:    Pt saw his doctor,Dr Drue Novel yesterday. He was supposed to send his EKG to Dr Ladona Ridgel to review. Britta Mccreedy wants to know if Dr Garald Balding received the EKG and if pt was cleared for surgery please?

## 2017-03-08 DIAGNOSIS — R69 Illness, unspecified: Secondary | ICD-10-CM | POA: Diagnosis not present

## 2017-03-10 ENCOUNTER — Telehealth: Payer: Self-pay | Admitting: Cardiology

## 2017-03-10 DIAGNOSIS — H25041 Posterior subcapsular polar age-related cataract, right eye: Secondary | ICD-10-CM | POA: Diagnosis not present

## 2017-03-10 DIAGNOSIS — H04123 Dry eye syndrome of bilateral lacrimal glands: Secondary | ICD-10-CM | POA: Diagnosis not present

## 2017-03-10 DIAGNOSIS — H2513 Age-related nuclear cataract, bilateral: Secondary | ICD-10-CM | POA: Diagnosis not present

## 2017-03-10 DIAGNOSIS — H2511 Age-related nuclear cataract, right eye: Secondary | ICD-10-CM | POA: Diagnosis not present

## 2017-03-10 NOTE — Telephone Encounter (Signed)
Surgical Clearance forms faxed to Dr. Wyline Mood at [phone] w/ OV note and EKG from 03/06/2017. Received fax confirmation. Forms sent for scanning.

## 2017-03-10 NOTE — Telephone Encounter (Signed)
Returned call to patient He saw Dr. Drue Novel last week and he had an EKG He was told his EKG was abnormal -- EKG showed PVCs Educated patient on what PVCs were Advised are not harmful unless are frequent, coupled together consecutively etc Patient wanted MD to review EKG before he plans to travel to [facility] for 2 weeks Informed him MD is in the hospital and he would be notified of request for EKG review and he states he can be called on his cell phone He also states he is set to have cataract surgery in August and would like to know if he can be cleared Will send to MD for review/advice

## 2017-03-10 NOTE — Telephone Encounter (Signed)
Spoke w/ Pt, he is wanting to discuss form completion w/ PCP, needs to be turned in before 5PM today. Wanting to discuss w/ PCP about rheumatoid factor results (he has tried reaching out to Dr. Olean Ree multiple times), and bigeminy. Pt requesting call back from provider directly.

## 2017-03-10 NOTE — Telephone Encounter (Signed)
New message    Pt is calling stating he had a check up with his PCP and he noticed an extra beat in his heart. Pt would like a call back from RN. Please call.

## 2017-03-10 NOTE — Telephone Encounter (Signed)
Spoke w/ Britta Mccreedy, informed that surgical clearance form has been sent and cardiology recommendations. Britta Mccreedy verbalized understanding.

## 2017-03-10 NOTE — Telephone Encounter (Signed)
Paperwork completed. Will be unable to call  patient today d/t  schedule. I discussed the EKG with cardiology, he is on bigeminy, and benign rhythm, if he develops symptoms such as chest pain, difficulty breathing, palpitations to let me know. If that is the case, most likely will need to see cardiology or come to the office.

## 2017-03-11 NOTE — Telephone Encounter (Signed)
OK to travel.  No change in therapy.  OK for cataract surgery.

## 2017-03-11 NOTE — Telephone Encounter (Signed)
Pt requested same information from Dr. Antoine Poche.   Appears Pt is being assisted by Dr. Antoine Poche and team.

## 2017-03-11 NOTE — Telephone Encounter (Signed)
ATC; phone did not ring despite multiple attempts. Will try again at later time.  

## 2017-03-12 NOTE — Telephone Encounter (Signed)
Patient notified of MD advice + voiced understanding, expressed appreciation for MD reviewing his concerns. Patient states he has no symptoms. No further questions at this time.

## 2017-03-13 NOTE — Telephone Encounter (Signed)
Called spoke with patient and advised of ABG results Pt voiced his understanding and is aware that he does not qualify for O2 at rest  Pt is aware he needs a test to qualify him for exertional O2 but he is currently in [facility] visiting with family, when he returns he is having cataract surgery and would like to call back to schedule this appt  Advised patient he may call the office at his convenience to schedule test Order placed Nothing further needed; will sign off

## 2017-03-26 HISTORY — PX: CATARACT EXTRACTION: SUR2

## 2017-03-31 ENCOUNTER — Telehealth: Payer: Self-pay | Admitting: Pulmonary Disease

## 2017-03-31 NOTE — Telephone Encounter (Signed)
Please start PA

## 2017-03-31 NOTE — Telephone Encounter (Signed)
Spiriva and Advair are the 2 inhalers insurance will cover. VS please advise if you would like to change to 1 of the preferred inhalers or start PA. Thanks.

## 2017-04-01 NOTE — Telephone Encounter (Signed)
[facility]ed [facility] information for Trelegy Pt had [facility]legy filled on [facility]/17 [facility] [place] for [facility]00 [facility] [place] [phone] Patient is Part D and the insurance paid $500.00 of Rx  PA #: [phone] Member ID: [id number]  Spoke with Lesly Rubenstein at [facility] - Trelegy is non-formulary Review started for exception  Alternative would be Anoro but the insurance will go ahead and approve this until 08/25/2017 - formulary exception.  Pt is aware that this has been approved and states that they do not need an Rx at this time. Will call once a new Rx is needed and will let us know if any issues with Co-pay. Nothing further needed.

## 2017-04-02 DIAGNOSIS — M069 Rheumatoid arthritis, unspecified: Secondary | ICD-10-CM | POA: Diagnosis not present

## 2017-04-02 DIAGNOSIS — M2550 Pain in unspecified joint: Secondary | ICD-10-CM | POA: Diagnosis not present

## 2017-04-02 DIAGNOSIS — Z79891 Long term (current) use of opiate analgesic: Secondary | ICD-10-CM | POA: Diagnosis not present

## 2017-04-02 DIAGNOSIS — E1142 Type 2 diabetes mellitus with diabetic polyneuropathy: Secondary | ICD-10-CM | POA: Diagnosis not present

## 2017-04-02 DIAGNOSIS — G894 Chronic pain syndrome: Secondary | ICD-10-CM | POA: Diagnosis not present

## 2017-04-02 DIAGNOSIS — G4700 Insomnia, unspecified: Secondary | ICD-10-CM | POA: Diagnosis not present

## 2017-04-03 DIAGNOSIS — M069 Rheumatoid arthritis, unspecified: Secondary | ICD-10-CM | POA: Diagnosis not present

## 2017-04-03 DIAGNOSIS — Z9981 Dependence on supplemental oxygen: Secondary | ICD-10-CM | POA: Diagnosis not present

## 2017-04-03 DIAGNOSIS — K219 Gastro-esophageal reflux disease without esophagitis: Secondary | ICD-10-CM | POA: Diagnosis not present

## 2017-04-03 DIAGNOSIS — Z951 Presence of aortocoronary bypass graft: Secondary | ICD-10-CM | POA: Diagnosis not present

## 2017-04-03 DIAGNOSIS — I252 Old myocardial infarction: Secondary | ICD-10-CM | POA: Diagnosis not present

## 2017-04-03 DIAGNOSIS — H2511 Age-related nuclear cataract, right eye: Secondary | ICD-10-CM | POA: Diagnosis not present

## 2017-04-03 DIAGNOSIS — J449 Chronic obstructive pulmonary disease, unspecified: Secondary | ICD-10-CM | POA: Diagnosis not present

## 2017-04-03 DIAGNOSIS — Z79899 Other long term (current) drug therapy: Secondary | ICD-10-CM | POA: Diagnosis not present

## 2017-04-03 DIAGNOSIS — I2510 Atherosclerotic heart disease of native coronary artery without angina pectoris: Secondary | ICD-10-CM | POA: Diagnosis not present

## 2017-04-03 DIAGNOSIS — E1142 Type 2 diabetes mellitus with diabetic polyneuropathy: Secondary | ICD-10-CM | POA: Diagnosis not present

## 2017-04-03 DIAGNOSIS — H25041 Posterior subcapsular polar age-related cataract, right eye: Secondary | ICD-10-CM | POA: Diagnosis not present

## 2017-04-24 ENCOUNTER — Telehealth: Payer: Self-pay | Admitting: Internal Medicine

## 2017-04-24 NOTE — Telephone Encounter (Addendum)
Relation to pt: wife / Bell[place],Barbara Call back [phone]   Reason for call:  Spouse inquiring about "no live shingle vaccination" and would like to know if patient can receive shingle injection due to he's medical condition,please advise

## 2017-04-29 NOTE — Telephone Encounter (Signed)
Spouse informed.

## 2017-04-29 NOTE — Telephone Encounter (Signed)
We do not have Shingrix in stock- Pt has supplemental Medicare- he would have to get at pharmacy anyway. He is eligible to get.

## 2017-05-02 ENCOUNTER — Ambulatory Visit (INDEPENDENT_AMBULATORY_CARE_PROVIDER_SITE_OTHER): Payer: Medicare HMO | Admitting: Internal Medicine

## 2017-05-02 ENCOUNTER — Encounter: Payer: Self-pay | Admitting: Internal Medicine

## 2017-05-02 VITALS — BP 118/70 | HR 88 | Temp 98.2°F | Resp 14 | Ht 72.0 in | Wt 215.1 lb

## 2017-05-02 DIAGNOSIS — Z01818 Encounter for other preprocedural examination: Secondary | ICD-10-CM

## 2017-05-02 NOTE — Progress Notes (Signed)
Subjective:    Patient ID: Joseph [name], male    DOB: 1951-06-22, 66 y.o.   MRN: [id number]  DOS:  05/02/2017 Type of visit - description : Surgical clearance Interval history: Patient had right cataract surgery 8- 2018, surgery went well.  Now ready for the next cataract surgery  Wt Readings from Last 3 Encounters:  05/02/17 215 lb 2 oz (97.6 kg)  03/06/17 218 lb (98.9 kg)  02/10/17 214 lb (97.1 kg)     Review of Systems Currently doing well. No new complaints. Breathing at baseline No chest pain No lower extremity edema No anxiety or depression  Past Medical History:  Diagnosis Date  . CAD (coronary artery disease)    MI 2007, CABG  . Cataracts, bilateral   . Complication of anesthesia    DIFFICULTY AFTER CABG HAD CONFUSION POST OP  . COPD (chronic obstructive pulmonary disease) (HCC)   . Diabetes mellitus    Dx 07-2008 A1C 6.2  . Esophageal dysmotility   . Fatty liver 10/11/09  . Gastroesophageal reflux disease   . Gastroparesis   . Hiatal hernia   . Hypertension   . Neuropathy    (related to RA per neuro note 05-2011)  . Onychomycosis   . Osteoporosis    per DEXA 05-2008  . Oxygen dependent    3l  . Peripheral polyneuropathy    Severe, causing problems with his feet, see surgeries  . Psoriasis   . Rheumatoid arthritis [facility])    [facility] Rheumatology    Past Surgical History:  Procedure Laterality Date  . A-FLUTTER ABLATION N/A 11/27/2016   Procedure: A-Flutter Ablation;  Surgeon: Marinus Maw, MD;  Location: [facility] INVASIVE CV LAB;  Service: Cardiovascular;  Laterality: N/A;  . achiles tendon surgery  8/09  . CARDIAC CATHETERIZATION N/A 01/23/2016   Procedure: Left Heart Cath and Cors/Grafts Angiography;  Surgeon: Lyn Records, MD;  Location: [facility] INVASIVE CV LAB;  Service: Cardiovascular;  Laterality: N/A;  . CARDIOVERSION N/A 10/23/2016   Procedure: CARDIOVERSION;  Surgeon: Rollene Rotunda, MD;  Location: [facility] ENDOSCOPY;  Service: Cardiovascular;  Laterality:  N/A;  . CATARACT EXTRACTION Right 03/2017  . CHOLECYSTECTOMY    . CORONARY ARTERY BYPASS GRAFT  2007   (LIMA to the LAD, left radial to obtuse marginal, SVG to first diagonal, SVG to PDA. His ejection fraction to 50-55%  . ESOPHAGOGASTRODUODENOSCOPY N/A 10/19/2012   Procedure: ESOPHAGOGASTRODUODENOSCOPY (EGD);  Surgeon: Hart Carwin, MD;  Location: [facility] ENDOSCOPY;  Service: Endoscopy;  Laterality: N/A;  the dilatation is possible.   Marland Kitchen FOOT SURGERY     to tendon release and repair of osteomyelitis   . LARYNGOSCOPY N/A 01/21/2014   Procedure: LARYNGOSCOPY;  Surgeon: Christia Reading, MD;  Location: [facility] OR;  Service: ENT;  Laterality: N/A;  micro direct laryngoscopy with prolaryn injection/jet venturi ventilation  . TOE AMPUTATION  4-12/ 3 14   d/t a deformity, found to have osteomyelitis, complicated by post-op foot strtess FX    Social History   Social History  . Marital status: Married    Spouse name: N/A  . Number of children: 0  . Years of education: N/A   Occupational History  . Disabled-retired Unemployed   Social History Main Topics  . Smoking status: Former Smoker    Packs/day: 1.00    Years: 40.00    Quit date: 08/26/2005  . Smokeless tobacco: Former Neurosurgeon    Quit date: 04/02/2006     Comment: smoked x 40 years, 1 ppd  .  Alcohol use No  . Drug use: No  . Sexual activity: Not on file   Other Topics Concern  . Not on file   Social History Narrative  . No narrative on file      Allergies as of 05/02/2017   No Known Allergies     Medication List       Accurate as of 05/02/17 11:59 PM. Always use your most recent med list.          ACAPELLA Misc Use as directed   albuterol 108 (90 Base) MCG/ACT inhaler Commonly known as:  PROAIR HFA Inhale 2 puffs into the lungs every 4 (four) hours as needed for wheezing or shortness of breath.   atorvastatin 80 MG tablet Commonly known as:  LIPITOR TAKE 1 TABLET(80 MG) BY MOUTH DAILY   B-D TB SYRINGE 1CC/27GX1/2" 27G X 1/2" 1  ML Misc Generic drug:  TUBERCULIN SYR 1CC/27GX1/2" Reported on 02/16/2016   clobetasol cream 0.05 % Commonly known as:  TEMOVATE Apply 1 application topically 2 (two) times daily.   Fluticasone-Umeclidin-Vilant 100-62.5-25 MCG/INH Aepb Commonly known as:  TRELEGY ELLIPTA Inhale 1 puff into the lungs daily.   FLUTTER Devi 1 Inhaler by Does not apply route 2 (two) times daily.   folic acid 800 MCG tablet Commonly known as:  FOLVITE Take 800 mcg by mouth daily.   furosemide 20 MG tablet Commonly known as:  LASIX TAKE 2 TABLETS BY MOUTH EVERY MORNING AND TAKE 1 TABLET EVERY EVENING AT 4:00PM   hydrocortisone cream 1 % Apply 1 application topically 3 (three) times daily as needed for itching (skin irritation).   LOTEMAX 0.5 % Gel Generic drug:  Loteprednol Etabonate Place 1 drop into both eyes every morning. daily   methotrexate 50 MG/2ML injection Inject 25 mg into the muscle every Thursday.   metoprolol tartrate 25 MG tablet Commonly known as:  LOPRESSOR TAKE 1 TABLET(25 MG) BY MOUTH TWICE DAILY   nitroGLYCERIN 0.4 MG SL tablet Commonly known as:  NITROSTAT Place 1 tablet (0.4 mg total) under the tongue every 5 (five) minutes as needed for chest pain (up to 3 doses for severe chest pain. If no relief after 3rd dose, proceed to the ED for an evaluation).   NON FORMULARY EVERGO PORTABLE PULSE DOSE OXYGEN oxygen 3L per nasal cannula 24 hours a day Dx: 496   ONE TOUCH ULTRA TEST test strip Generic drug:  glucose blood USE TO TEST BLOOD SUGAR NO MORE THAN TWICE DAILY   onetouch ultrasoft lancets USE TO TEST BLOOD SUGAR NO MORE THAN TWICE DAILY   oxyCODONE-acetaminophen 7.5-325 MG tablet Commonly known as:  PERCOCET Take 1 tablet by mouth 3 (three) times daily. 3 times a day   pantoprazole 40 MG tablet Commonly known as:  PROTONIX Take 1 tablet (40 mg total) by mouth 2 (two) times daily.   PERFECT IRON PO Take 1 tablet by mouth every morning.   potassium chloride  10 MEQ tablet Commonly known as:  K-DUR TAKE 4 TABLETS BY MOUTH EVERY MORNING AND TAKE 2 TABLETS BY MOUTH EVERY EVENING   predniSONE 5 MG tablet Commonly known as:  DELTASONE Take 5 mg by mouth daily. Take as directed   vitamin B-12 250 MCG tablet Commonly known as:  CYANOCOBALAMIN Take 250 mcg by mouth daily.   vitamin C 500 MG tablet Commonly known as:  ASCORBIC ACID Take 500 mg by mouth daily.          Objective:   Physical Exam BP  118/70 (BP Location: Left Arm, Patient Position: Sitting, Cuff Size: Small)   Pulse 88   Temp 98.2 F (36.8 C) (Oral)   Resp 14   Ht 6' (1.829 m)   Wt 215 lb 2 oz (97.6 kg)   SpO2 97%   BMI 29.18 kg/m  General:   Well developed, well nourished . NAD.  HEENT:  Normocephalic . Face symmetric, atraumatic Lungs:  CTA B Normal respiratory effort, no intercostal retractions, no accessory muscle use. Heart: Seems regular today,  no murmur.  No pretibial edema bilaterally  Skin: Not pale. Not jaundice Neurologic:  alert & oriented X3.  Speech normal, gait at baseline and quite limited by RA. Psych--  Cognition and judgment appear intact.  Cooperative with normal attention span and concentration.  Behavior appropriate. No anxious or depressed appearing.         Assessment & Plan:   Assessment  MSK: --RA --- Dr Sharren Bridge --Psoriasis --Osteoporosis Peripheral neuropathy, severe, several feet surgeries DM HTN Hyperlipidemia Pulmonary: --COPD --Pulmonary fibrosis --O2 24/ 7 --Chronic respiratory failure CV: --CAD, CHF, MI and  CABG 2007 --CP- CT chest no PE, cath 01-23-16, one occluded graft, rx medical mngmt  dx: question of pericarditis  --Pericarditis, admitted 01/2016 --New  onset A. Flutter 09-2016, admitted. Ablation 4-18, 12-2016: d/c OAC, rx asa 81; restart OAC if flutter return GI --Barrettts --GERD --esophageal dysmotility --Fatty liver Hoarseness , chronic, saw ENT , s/p botox 2015 , helped tempoarrily    PLAN Surgical clearance: Patient to have left cataract surgery in few days, recent cataract surgery on the right when well. He is feeling at baseline. EKG was done at the last visit and it was okay by cardiology. Patient is clear for surgery. Extensive paperwork completed.

## 2017-05-02 NOTE — Progress Notes (Signed)
Pre visit review using our clinic review tool, if applicable. No additional management support is needed unless otherwise documented below in the visit note. 

## 2017-05-02 NOTE — Patient Instructions (Signed)
You are clear for your surgery

## 2017-05-04 ENCOUNTER — Encounter: Payer: Self-pay | Admitting: Internal Medicine

## 2017-05-04 NOTE — Assessment & Plan Note (Signed)
Surgical clearance: Patient to have left cataract surgery in few days, recent cataract surgery on the right when well. He is feeling at baseline. EKG was done at the last visit and it was okay by cardiology. Patient is clear for surgery. Extensive paperwork completed.

## 2017-05-13 ENCOUNTER — Ambulatory Visit (INDEPENDENT_AMBULATORY_CARE_PROVIDER_SITE_OTHER): Payer: Medicare HMO | Admitting: Internal Medicine

## 2017-05-13 ENCOUNTER — Encounter: Payer: Self-pay | Admitting: Internal Medicine

## 2017-05-13 ENCOUNTER — Telehealth: Payer: Self-pay

## 2017-05-13 VITALS — BP 98/66 | HR 60 | Temp 98.3°F | Wt 217.4 lb

## 2017-05-13 DIAGNOSIS — G4700 Insomnia, unspecified: Secondary | ICD-10-CM | POA: Diagnosis not present

## 2017-05-13 DIAGNOSIS — G894 Chronic pain syndrome: Secondary | ICD-10-CM | POA: Diagnosis not present

## 2017-05-13 DIAGNOSIS — E1142 Type 2 diabetes mellitus with diabetic polyneuropathy: Secondary | ICD-10-CM

## 2017-05-13 DIAGNOSIS — Z23 Encounter for immunization: Secondary | ICD-10-CM

## 2017-05-13 DIAGNOSIS — M069 Rheumatoid arthritis, unspecified: Secondary | ICD-10-CM | POA: Diagnosis not present

## 2017-05-13 NOTE — Telephone Encounter (Signed)
Received fax confirmation

## 2017-05-13 NOTE — Progress Notes (Signed)
Pre visit review using our clinic review tool, if applicable. No additional management support is needed unless otherwise documented below in the visit note. 

## 2017-05-13 NOTE — Patient Instructions (Signed)
We are faxing the form today  Check the  blood pressure 3-4 times a week Be sure your blood pressure is between 110/65 and  145/85.  if it is consistently higher or lower, let me know

## 2017-05-13 NOTE — Telephone Encounter (Signed)
Completed forms and OV notes from 05/13/2017 faxed to [facility] at [phone]. Will hold forms in office for several days to make sure documentation is correct before sending for scanning.

## 2017-05-13 NOTE — Progress Notes (Signed)
Subjective:    Patient ID: Joseph [name], male    DOB: 03/25/1951, 66 y.o.   MRN: [id number]  DOS:  05/13/2017 Type of visit - description : f/u Interval history: Needs paperwork for diabetic shoes. Long  history of neuropathy, has no feeling Or sensitivity on his feet in a sock distribution, history of  multiple surgeries.  BP Readings from Last 3 Encounters:  05/13/17 98/66  05/02/17 118/70  03/06/17 126/74    Review of Systems Denies swelling, pain.   Past Medical History:  Diagnosis Date  . CAD (coronary artery disease)    MI 2007, CABG  . Cataracts, bilateral   . Complication of anesthesia    DIFFICULTY AFTER CABG HAD CONFUSION POST OP  . COPD (chronic obstructive pulmonary disease) (HCC)   . Diabetes mellitus    Dx 07-2008 A1C 6.2  . Esophageal dysmotility   . Fatty liver 10/11/09  . Gastroesophageal reflux disease   . Gastroparesis   . Hiatal hernia   . Hypertension   . Neuropathy    (related to RA per neuro note 05-2011)  . Onychomycosis   . Osteoporosis    per DEXA 05-2008  . Oxygen dependent    3l  . Peripheral polyneuropathy    Severe, causing problems with his feet, see surgeries  . Psoriasis   . Rheumatoid arthritis [facility])    [facility] Rheumatology    Past Surgical History:  Procedure Laterality Date  . A-FLUTTER ABLATION N/A 11/27/2016   Procedure: A-Flutter Ablation;  Surgeon: Marinus Maw, MD;  Location: [facility] INVASIVE CV LAB;  Service: Cardiovascular;  Laterality: N/A;  . achiles tendon surgery  8/09  . CARDIAC CATHETERIZATION N/A 01/23/2016   Procedure: Left Heart Cath and Cors/Grafts Angiography;  Surgeon: Lyn Records, MD;  Location: [facility] INVASIVE CV LAB;  Service: Cardiovascular;  Laterality: N/A;  . CARDIOVERSION N/A 10/23/2016   Procedure: CARDIOVERSION;  Surgeon: Rollene Rotunda, MD;  Location: [facility] ENDOSCOPY;  Service: Cardiovascular;  Laterality: N/A;  . CATARACT EXTRACTION Right 03/2017  . CHOLECYSTECTOMY    . CORONARY ARTERY BYPASS GRAFT   2007   (LIMA to the LAD, left radial to obtuse marginal, SVG to first diagonal, SVG to PDA. His ejection fraction to 50-55%  . ESOPHAGOGASTRODUODENOSCOPY N/A 10/19/2012   Procedure: ESOPHAGOGASTRODUODENOSCOPY (EGD);  Surgeon: Hart Carwin, MD;  Location: [facility] ENDOSCOPY;  Service: Endoscopy;  Laterality: N/A;  the dilatation is possible.   Marland Kitchen FOOT SURGERY     to tendon release and repair of osteomyelitis   . LARYNGOSCOPY N/A 01/21/2014   Procedure: LARYNGOSCOPY;  Surgeon: Christia Reading, MD;  Location: [facility] OR;  Service: ENT;  Laterality: N/A;  micro direct laryngoscopy with prolaryn injection/jet venturi ventilation  . TOE AMPUTATION  4-12/ 3 14   d/t a deformity, found to have osteomyelitis, complicated by post-op foot strtess FX    Social History   Social History  . Marital status: Married    Spouse name: N/A  . Number of children: 0  . Years of education: N/A   Occupational History  . Disabled-retired Unemployed   Social History Main Topics  . Smoking status: Former Smoker    Packs/day: 1.00    Years: 40.00    Quit date: 08/26/2005  . Smokeless tobacco: Former Neurosurgeon    Quit date: 04/02/2006     Comment: smoked x 40 years, 1 ppd  . Alcohol use No  . Drug use: No  . Sexual activity: Not on file   Other  Topics Concern  . Not on file   Social History Narrative  . No narrative on file      Allergies as of 05/13/2017   No Known Allergies     Medication List       Accurate as of 05/13/17  5:32 PM. Always use your most recent med list.          ACAPELLA Misc Use as directed   albuterol 108 (90 Base) MCG/ACT inhaler Commonly known as:  PROAIR HFA Inhale 2 puffs into the lungs every 4 (four) hours as needed for wheezing or shortness of breath.   atorvastatin 80 MG tablet Commonly known as:  LIPITOR TAKE 1 TABLET(80 MG) BY MOUTH DAILY   B-D TB SYRINGE 1CC/27GX1/2" 27G X 1/2" 1 ML Misc Generic drug:  TUBERCULIN SYR 1CC/27GX1/2" Reported on 02/16/2016   clobetasol cream  0.05 % Commonly known as:  TEMOVATE Apply 1 application topically 2 (two) times daily.   Fluticasone-Umeclidin-Vilant 100-62.5-25 MCG/INH Aepb Commonly known as:  TRELEGY ELLIPTA Inhale 1 puff into the lungs daily.   FLUTTER Devi 1 Inhaler by Does not apply route 2 (two) times daily.   folic acid 800 MCG tablet Commonly known as:  FOLVITE Take 800 mcg by mouth daily.   furosemide 20 MG tablet Commonly known as:  LASIX TAKE 2 TABLETS BY MOUTH EVERY MORNING AND TAKE 1 TABLET EVERY EVENING AT 4:00PM   hydrocortisone cream 1 % Apply 1 application topically 3 (three) times daily as needed for itching (skin irritation).   LOTEMAX 0.5 % Gel Generic drug:  Loteprednol Etabonate Place 1 drop into both eyes every morning. daily   methotrexate 50 MG/2ML injection Inject 25 mg into the muscle every Thursday.   metoprolol tartrate 25 MG tablet Commonly known as:  LOPRESSOR TAKE 1 TABLET(25 MG) BY MOUTH TWICE DAILY   nitroGLYCERIN 0.4 MG SL tablet Commonly known as:  NITROSTAT Place 1 tablet (0.4 mg total) under the tongue every 5 (five) minutes as needed for chest pain (up to 3 doses for severe chest pain. If no relief after 3rd dose, proceed to the ED for an evaluation).   NON FORMULARY EVERGO PORTABLE PULSE DOSE OXYGEN oxygen 3L per nasal cannula 24 hours a day Dx: 496   ONE TOUCH ULTRA TEST test strip Generic drug:  glucose blood USE TO TEST BLOOD SUGAR NO MORE THAN TWICE DAILY   onetouch ultrasoft lancets USE TO TEST BLOOD SUGAR NO MORE THAN TWICE DAILY   oxyCODONE-acetaminophen 7.5-325 MG tablet Commonly known as:  PERCOCET Take 1 tablet by mouth 3 (three) times daily. 3 times a day   pantoprazole 40 MG tablet Commonly known as:  PROTONIX Take 1 tablet (40 mg total) by mouth 2 (two) times daily.   PERFECT IRON PO Take 1 tablet by mouth every morning.   potassium chloride 10 MEQ tablet Commonly known as:  K-DUR TAKE 4 TABLETS BY MOUTH EVERY MORNING AND TAKE 2  TABLETS BY MOUTH EVERY EVENING   predniSONE 5 MG tablet Commonly known as:  DELTASONE Take 5 mg by mouth daily. Take as directed   vitamin B-12 250 MCG tablet Commonly known as:  CYANOCOBALAMIN Take 250 mcg by mouth daily.   vitamin C 500 MG tablet Commonly known as:  ASCORBIC ACID Take 500 mg by mouth daily.            Discharge Care Instructions        Start     Ordered   05/13/17 0000  Flu vaccine HIGH DOSE PF (Fluzone High dose)     05/13/17 1146   05/13/17 0000  Pneumococcal polysaccharide vaccine 23-valent greater than or equal to 2yo subcutaneous/IM     05/13/17 1146         Objective:   Physical Exam BP 98/66 (BP Location: Left Arm, Cuff Size: Large)   Pulse 60   Temp 98.3 F (36.8 C) (Oral)   Wt 217 lb 6.4 oz (98.6 kg)   SpO2 97%   BMI 29.48 kg/m  General:   Well developed, well nourished . NAD.  HEENT:  Normocephalic . Face symmetric, atraumatic Heart: RRR DIABETIC FEET EXAM: No lower extremity edema Normal pedal and post tibialis pulses bilaterally Skin dry, nails thick, (+)  calluses Pinprick examination: absent feeling throughout  See pictures   Skin: Not pale. Not jaundice Neurologic:  alert & oriented X3.  Psych--  Cognition and judgment appear intact.  Cooperative with normal attention span and concentration.  Behavior appropriate. No anxious or depressed appearing.             Assessment & Plan:  Assessment  MSK: --RA --- Dr Sharren Bridge --Psoriasis --Osteoporosis Peripheral neuropathy, severe, several feet surgeries DM HTN Hyperlipidemia Pulmonary: --COPD --Pulmonary fibrosis --O2 24/ 7 --Chronic respiratory failure CV: --CAD, CHF, MI and  CABG 2007 --CP- CT chest no PE, cath 01-23-16, one occluded graft, rx medical mngmt  dx: question of pericarditis  --Pericarditis, admitted 01/2016 --New  onset A. Flutter 09-2016, admitted. Ablation 4-18, 12-2016: d/c OAC, rx asa 81; restart OAC if flutter  return GI --Barrettts --GERD --esophageal dysmotility --Fatty liver Hoarseness , chronic, saw ENT , s/p botox 2015 , helped tempoarrily   PLAN Peripheral neuropathy :status several feet surgeries, has a history of ulcers and calluses. Extensive paper work completed for his shoes Primary care: Flu shot today, pneumonia 23 booster today

## 2017-05-13 NOTE — Assessment & Plan Note (Signed)
Peripheral neuropathy :status several feet surgeries, has a history of ulcers and calluses. Extensive paper work completed for his shoes Primary care: Flu shot today, pneumonia 23 booster today

## 2017-05-15 DIAGNOSIS — H2512 Age-related nuclear cataract, left eye: Secondary | ICD-10-CM | POA: Diagnosis not present

## 2017-05-15 DIAGNOSIS — H25042 Posterior subcapsular polar age-related cataract, left eye: Secondary | ICD-10-CM | POA: Diagnosis not present

## 2017-05-16 ENCOUNTER — Other Ambulatory Visit: Payer: Medicare HMO

## 2017-05-16 ENCOUNTER — Ambulatory Visit (INDEPENDENT_AMBULATORY_CARE_PROVIDER_SITE_OTHER): Payer: Medicare HMO | Admitting: Gastroenterology

## 2017-05-16 ENCOUNTER — Encounter: Payer: Self-pay | Admitting: Gastroenterology

## 2017-05-16 VITALS — BP 124/84 | HR 99 | Ht 72.0 in | Wt 211.0 lb

## 2017-05-16 DIAGNOSIS — Z1211 Encounter for screening for malignant neoplasm of colon: Secondary | ICD-10-CM

## 2017-05-16 DIAGNOSIS — K219 Gastro-esophageal reflux disease without esophagitis: Secondary | ICD-10-CM

## 2017-05-16 DIAGNOSIS — K760 Fatty (change of) liver, not elsewhere classified: Secondary | ICD-10-CM

## 2017-05-16 MED ORDER — PANTOPRAZOLE SODIUM 40 MG PO TBEC: 40.0000 mg | DELAYED_RELEASE_TABLET | Freq: Two times a day (BID) | ORAL | 3 refills | Status: DC

## 2017-05-16 NOTE — Patient Instructions (Signed)
We have sent the following medications to your pharmacy for you to pick up at your convenience: Protonix 40mg   Your physician has requested that you go to the basement for the following lab work before leaving today: FIT stool test  If you are age 65 or older, your body mass index should be between 23-30. Your Body mass index is 28.62 kg/m. If this is out of the aforementioned range listed, please consider follow up with your Primary Care Provider.  If you are age 14 or younger, your body mass index should be between 19-25. Your Body mass index is 28.62 kg/m. If this is out of the aformentioned range listed, please consider follow up with your Primary Care Provider.

## 2017-05-16 NOTE — Telephone Encounter (Signed)
Forms sent for scanning

## 2017-05-16 NOTE — Progress Notes (Signed)
HPI :  66 y/o male here for a follow up visit for GERD. He was previously followed by Dr. Juanda Chance, last seen in 12/2014. He has a history of CAD s/p CABG, oxygen dependant COPD, RA. Fatty liver  Protonix working well. He has been on this twice daily. Working well of rhim, no side effects. No breakthrough of reflux. He sleeps with head of bed elevated which has helped significantly as well, helped nocturnal symptoms. No dysphagia. Eating okay. He is trying to lose weight, no unexpected weight loss. No FH of esophagus or colon cancer.   No prior colon cancer screening. No trouble with his bowels. No blood in the stools. No abdominal pains. He is concerned about his lack of prior colon cancer screening.   He reports he had an ablation for atrial fibrillation for which he tolerated 5 hours of anesthesia. He is also scheduled for cataract surgery.  EGD 10/19/2012 - suspected 3cm segment of BE - biopsies showed no evidence of Barrett's or dysplasia Barium study 10/21/2012 - nonspecific dysmotility Gastric emptying study 10/20/12 - gastroparesis, reflux   Past Medical History:  Diagnosis Date  . CAD (coronary artery disease)    MI 2007, CABG  . Cataracts, bilateral   . Complication of anesthesia    DIFFICULTY AFTER CABG HAD CONFUSION POST OP  . COPD (chronic obstructive pulmonary disease) (HCC)   . Diabetes mellitus    Dx 07-2008 A1C 6.2  . Esophageal dysmotility   . Fatty liver 10/11/09  . Gastroesophageal reflux disease   . Gastroparesis   . Hiatal hernia   . Hypertension   . Neuropathy    (related to RA per neuro note 05-2011)  . Onychomycosis   . Osteoporosis    per DEXA 05-2008  . Oxygen dependent    3l  . Peripheral polyneuropathy    Severe, causing problems with his feet, see surgeries  . Psoriasis   . Rheumatoid arthritis [facility])    [facility] Rheumatology     Past Surgical History:  Procedure Laterality Date  . A-FLUTTER ABLATION N/A 11/27/2016   Procedure: A-Flutter Ablation;   Surgeon: Marinus Maw, MD;  Location: [facility] INVASIVE CV LAB;  Service: Cardiovascular;  Laterality: N/A;  . achiles tendon surgery  8/09  . CARDIAC CATHETERIZATION N/A 01/23/2016   Procedure: Left Heart Cath and Cors/Grafts Angiography;  Surgeon: Lyn Records, MD;  Location: [facility] INVASIVE CV LAB;  Service: Cardiovascular;  Laterality: N/A;  . CARDIOVERSION N/A 10/23/2016   Procedure: CARDIOVERSION;  Surgeon: Rollene Rotunda, MD;  Location: [facility] ENDOSCOPY;  Service: Cardiovascular;  Laterality: N/A;  . CATARACT EXTRACTION Right 03/2017  . CHOLECYSTECTOMY    . CORONARY ARTERY BYPASS GRAFT  2007   (LIMA to the LAD, left radial to obtuse marginal, SVG to first diagonal, SVG to PDA. His ejection fraction to 50-55%  . ESOPHAGOGASTRODUODENOSCOPY N/A 10/19/2012   Procedure: ESOPHAGOGASTRODUODENOSCOPY (EGD);  Surgeon: Hart Carwin, MD;  Location: [facility] ENDOSCOPY;  Service: Endoscopy;  Laterality: N/A;  the dilatation is possible.   Marland Kitchen FOOT SURGERY     to tendon release and repair of osteomyelitis   . LARYNGOSCOPY N/A 01/21/2014   Procedure: LARYNGOSCOPY;  Surgeon: Christia Reading, MD;  Location: [facility] OR;  Service: ENT;  Laterality: N/A;  micro direct laryngoscopy with prolaryn injection/jet venturi ventilation  . TOE AMPUTATION  4-12/ 3 14   d/t a deformity, found to have osteomyelitis, complicated by post-op foot strtess FX   Family History  Problem Relation Age of Onset  .  Heart disease Mother   . Diverticulitis Mother   . Heart disease Father   . Heart attack Father        x2  . Colon cancer Neg Hx   . Prostate cancer Neg Hx    Social History  Substance Use Topics  . Smoking status: Former Smoker    Packs/day: 1.00    Years: 40.00    Quit date: 08/26/2005  . Smokeless tobacco: Former Neurosurgeon    Quit date: 04/02/2006     Comment: smoked x 40 years, 1 ppd  . Alcohol use No   Current Outpatient Prescriptions  Medication Sig Dispense Refill  . albuterol (PROAIR HFA) 108 (90 Base) MCG/ACT inhaler Inhale 2  puffs into the lungs every 4 (four) hours as needed for wheezing or shortness of breath. 1 Inhaler 3  . Ascorbic Acid (VITAMIN C) 500 MG tablet Take 500 mg by mouth daily.      Marland Kitchen atorvastatin (LIPITOR) 80 MG tablet TAKE 1 TABLET(80 MG) BY MOUTH DAILY 90 tablet 3  . B Complex-C (SUPER B COMPLEX PO) Take by mouth daily.    . B-D TB SYRINGE 1CC/27GX1/2" 27G X 1/2" 1 ML MISC Reported on 02/16/2016    . Carbonyl Iron (PERFECT IRON PO) Take 1 tablet by mouth every morning.     . clobetasol cream (TEMOVATE) 0.05 % Apply 1 application topically 2 (two) times daily.    . Fluticasone-Umeclidin-Vilant (TRELEGY ELLIPTA) 100-62.5-25 MCG/INH AEPB Inhale 1 puff into the lungs daily. 28 each 4  . folic acid (FOLVITE) 800 MCG tablet Take 800 mcg by mouth daily.    . furosemide (LASIX) 20 MG tablet TAKE 2 TABLETS BY MOUTH EVERY MORNING AND TAKE 1 TABLET EVERY EVENING AT 4:00PM 270 tablet 3  . hydrocortisone cream 1 % Apply 1 application topically 3 (three) times daily as needed for itching (skin irritation). 30 g 0  . Lancets (ONETOUCH ULTRASOFT) lancets USE TO TEST BLOOD SUGAR NO MORE THAN TWICE DAILY 100 each 12  . Loteprednol Etabonate (LOTEMAX) 0.5 % GEL Place 1 drop into both eyes every morning. daily    . methotrexate 50 MG/2ML injection Inject 25 mg into the muscle every Thursday.     . metoprolol tartrate (LOPRESSOR) 25 MG tablet TAKE 1 TABLET(25 MG) BY MOUTH TWICE DAILY 180 tablet 3  . Misc. Devices (ACAPELLA) MISC Use as directed 1 each 0  . nitroGLYCERIN (NITROSTAT) 0.4 MG SL tablet Place 1 tablet (0.4 mg total) under the tongue every 5 (five) minutes as needed for chest pain (up to 3 doses for severe chest pain. If no relief after 3rd dose, proceed to the ED for an evaluation). 25 tablet 2  . NON FORMULARY EVERGO PORTABLE PULSE DOSE OXYGEN oxygen 3L per nasal cannula 24 hours a day Dx: 496    . ONE TOUCH ULTRA TEST test strip USE TO TEST BLOOD SUGAR NO MORE THAN TWICE DAILY 100 each 12  .  oxyCODONE-acetaminophen (PERCOCET) 7.5-325 MG per tablet Take 1 tablet by mouth 3 (three) times daily. 3 times a day    . pantoprazole (PROTONIX) 40 MG tablet Take 1 tablet (40 mg total) by mouth 2 (two) times daily. 180 tablet 3  . potassium chloride (K-DUR) 10 MEQ tablet TAKE 4 TABLETS BY MOUTH EVERY MORNING AND TAKE 2 TABLETS BY MOUTH EVERY EVENING 180 tablet 9  . predniSONE (DELTASONE) 5 MG tablet Take 5 mg by mouth daily. Take as directed    . pregabalin (LYRICA) 75 MG  capsule Take 75 mg by mouth 2 (two) times daily.    Marland Kitchen Respiratory Therapy Supplies (FLUTTER) DEVI 1 Inhaler by Does not apply route 2 (two) times daily. 1 each 0  . vitamin B-12 (CYANOCOBALAMIN) 250 MCG tablet Take 250 mcg by mouth daily.       No current facility-administered medications for this visit.    No Known Allergies   Review of Systems: All systems reviewed and negative except where noted in HPI.   Lab Results  Component Value Date   WBC 4.0 03/06/2017   HGB 13.9 03/06/2017   HCT 40.5 03/06/2017   MCV 89.9 03/06/2017   PLT 147.0 (L) 03/06/2017    Lab Results  Component Value Date   CREATININE 0.70 03/06/2017   BUN 16 03/06/2017   NA 138 03/06/2017   K 4.1 03/06/2017   CL 101 03/06/2017   CO2 26 03/06/2017    Lab Results  Component Value Date   ALT 27 03/06/2017   AST 29 03/06/2017   ALKPHOS 77 03/06/2017   BILITOT 0.9 03/06/2017    Lab Results  Component Value Date   INR 1.0 11/26/2016   INR 1.1 10/14/2016   INR 1.28 01/20/2016     Physical Exam: BP 124/84   Pulse 99   Ht 6' (1.829 m)   Wt 211 lb (95.7 kg)   SpO2 98%   BMI 28.62 kg/m  Constitutional: Pleasant, male in no acute distress, wearing oxygen HEENT: Normocephalic and atraumatic. Conjunctivae are normal. No scleral icterus. Neck supple.  Cardiovascular: Normal rate, regular rhythm.  Pulmonary/chest: Effort normal and breath sounds normal.  Abdominal: Soft, nondistended, nontender. There are no masses palpable. No  hepatomegaly. Extremities: no edema Lymphadenopathy: No cervical adenopathy noted. Neurological: Alert and oriented to person place and time. Skin: Skin is warm and dry. No rashes noted. Psychiatric: Normal mood and affect. Behavior is normal.   ASSESSMENT AND PLAN: 66 year old male with multiple comorbidities as outlined above, here for assessment of the following issues:  GERD - based on gross appearance of his last EGD, it appears he likely does have a 3 cm segment of Barrett's, although prior biopsies were normal with just reflux changes. He's had very good control of his reflux symptoms on Protonix 40 mg twice daily. We discussed risks and benefits of long-term PPI use, he will try to use the lowest dose needed to control symptoms. He will wean down to Protonix once daily to see if this continues to control symptoms. He asked about a follow-up endoscopy for possible Barrett surveillance, and we discussed this at length. He is higher than average risk for endoscopy given his pulmonary status, however he has tolerated sedation well for recent procedures. He wants to proceed with an endoscopy following risks and benefits. We will await his stool testing first to see if he warrants colonoscopy.   Colon cancer screening - he's not had any prior screening, no alarm features but has significant tobacco history. He is higher than average risk for anesthesia. We discussed whether or not he wanted to pursue screening at all given his pulmonary status. He is anxious about not having prior colon cancer screening. We discussed stool testing versus colonoscopy versus not performing a screening. He strongly wished to do some sort of screening and opted for stool test, for FIT. If this is positive he wishes to have a colonoscopy, which will need to be done at the hospital.   Fatty liver - could be related to history of  steroid use, his LFTs are normal. Monitor LFTs once yearly, hopefully avoid steroids if  possible.  Ileene Patrick, MD [facility] Gastroenterology Pager [phone]

## 2017-05-23 ENCOUNTER — Telehealth: Payer: Self-pay | Admitting: *Deleted

## 2017-05-23 NOTE — Telephone Encounter (Signed)
Received Physician Orders from [employer] for Bilateral Diabetic Shoes and Inserts; forwarded to provider/SLS 09/28

## 2017-05-25 NOTE — Progress Notes (Signed)
HPI The patient presents follow up of CAD with bypass in 2007.   In May of last year he was admitted with chest pain and possible pericarditis.   Cardiac cath on 01/23/16 showed occluded saphenous vein graft to diagonal, patent free left radial artery to the obtuse marginal, patent SVG to distal RCA of left Cx, widely patent LIMA to distal LAD, severe native vessel disease with total occlusion of several large diagonals. High-grade obstruction in nondominant RCA and high grade obstruction of proximal and mid LAD with total occlusion distally. Recommendations were for continued medical therapy.  He subsequently was admitted for atrial flutter and later had ablation.  He returns for follow up.    Since I last saw him he has done well.  The patient denies any new symptoms such as chest discomfort, neck or arm discomfort. There has been no new shortness of breath, PND or orthopnea. There have been no reported palpitations, presyncope or syncope.  Is is doing some activities.  With this he denies any cardiac complaints.   No Known Allergies  Current Outpatient Prescriptions  Medication Sig Dispense Refill  . albuterol (PROAIR HFA) 108 (90 Base) MCG/ACT inhaler Inhale 2 puffs into the lungs every 4 (four) hours as needed for wheezing or shortness of breath. 1 Inhaler 3  . Ascorbic Acid (VITAMIN C) 500 MG tablet Take 500 mg by mouth daily.      Marland Kitchen aspirin EC 81 MG tablet Take 81 mg by mouth daily.    Marland Kitchen atorvastatin (LIPITOR) 80 MG tablet TAKE 1 TABLET(80 MG) BY MOUTH DAILY 90 tablet 3  . B Complex-C (SUPER B COMPLEX PO) Take by mouth daily.    . B-D TB SYRINGE 1CC/27GX1/2" 27G X 1/2" 1 ML MISC Reported on 02/16/2016    . Carbonyl Iron (PERFECT IRON PO) Take 1 tablet by mouth every morning.     . clobetasol cream (TEMOVATE) 0.05 % Apply 1 application topically 2 (two) times daily.    . Fluticasone-Umeclidin-Vilant (TRELEGY ELLIPTA) 100-62.5-25 MCG/INH AEPB Inhale 1 puff into the lungs daily. 28 each 4    . folic acid (FOLVITE) 800 MCG tablet Take 800 mcg by mouth daily.    . furosemide (LASIX) 20 MG tablet TAKE 2 TABLETS BY MOUTH EVERY MORNING AND TAKE 1 TABLET EVERY EVENING AT 4:00PM 270 tablet 3  . hydrocortisone cream 1 % Apply 1 application topically 3 (three) times daily as needed for itching (skin irritation). 30 g 0  . Lancets (ONETOUCH ULTRASOFT) lancets USE TO TEST BLOOD SUGAR NO MORE THAN TWICE DAILY 100 each 12  . Loteprednol Etabonate (LOTEMAX) 0.5 % GEL Place 1 drop into both eyes every morning. daily    . methotrexate 50 MG/2ML injection Inject 25 mg into the muscle every Thursday.     . metoprolol tartrate (LOPRESSOR) 25 MG tablet TAKE 1 TABLET(25 MG) BY MOUTH TWICE DAILY 180 tablet 3  . Misc. Devices (ACAPELLA) MISC Use as directed 1 each 0  . nitroGLYCERIN (NITROSTAT) 0.4 MG SL tablet Place 1 tablet (0.4 mg total) under the tongue every 5 (five) minutes as needed for chest pain (up to 3 doses for severe chest pain. If no relief after 3rd dose, proceed to the ED for an evaluation). 25 tablet 2  . NON FORMULARY EVERGO PORTABLE PULSE DOSE OXYGEN oxygen 3L per nasal cannula 24 hours a day Dx: 496    . ONE TOUCH ULTRA TEST test strip USE TO TEST BLOOD SUGAR NO MORE THAN TWICE  DAILY 100 each 12  . oxyCODONE-acetaminophen (PERCOCET) 7.5-325 MG per tablet Take 1 tablet by mouth 3 (three) times daily. 3 times a day    . pantoprazole (PROTONIX) 40 MG tablet Take 1 tablet (40 mg total) by mouth 2 (two) times daily. 180 tablet 3  . potassium chloride (K-DUR) 10 MEQ tablet TAKE 4 TABLETS BY MOUTH EVERY MORNING AND TAKE 2 TABLETS BY MOUTH EVERY EVENING 180 tablet 9  . predniSONE (DELTASONE) 5 MG tablet Take 5 mg by mouth daily. Take as directed    . pregabalin (LYRICA) 75 MG capsule Take 75 mg by mouth 2 (two) times daily.    Marland Kitchen Respiratory Therapy Supplies (FLUTTER) DEVI 1 Inhaler by Does not apply route 2 (two) times daily. 1 each 0  . vitamin B-12 (CYANOCOBALAMIN) 250 MCG tablet Take 250  mcg by mouth daily.       No current facility-administered medications for this visit.     Past Medical History:  Diagnosis Date  . CAD (coronary artery disease)    MI 2007, CABG  . Cataracts, bilateral   . Complication of anesthesia    DIFFICULTY AFTER CABG HAD CONFUSION POST OP  . COPD (chronic obstructive pulmonary disease) (HCC)   . Diabetes mellitus    Dx 07-2008 A1C 6.2  . Esophageal dysmotility   . Fatty liver 10/11/09  . Gastroesophageal reflux disease   . Gastroparesis   . Hiatal hernia   . Hypertension   . Neuropathy    (related to RA per neuro note 05-2011)  . Onychomycosis   . Osteoporosis    per DEXA 05-2008  . Oxygen dependent    3l  . Peripheral polyneuropathy    Severe, causing problems with his feet, see surgeries  . Psoriasis   . Rheumatoid arthritis [facility])    [facility] Rheumatology    Past Surgical History:  Procedure Laterality Date  . A-FLUTTER ABLATION N/A 11/27/2016   Procedure: A-Flutter Ablation;  Surgeon: Marinus Maw, MD;  Location: [facility] INVASIVE CV LAB;  Service: Cardiovascular;  Laterality: N/A;  . achiles tendon surgery  8/09  . CARDIAC CATHETERIZATION N/A 01/23/2016   Procedure: Left Heart Cath and Cors/Grafts Angiography;  Surgeon: Lyn Records, MD;  Location: [facility] INVASIVE CV LAB;  Service: Cardiovascular;  Laterality: N/A;  . CARDIOVERSION N/A 10/23/2016   Procedure: CARDIOVERSION;  Surgeon: Rollene Rotunda, MD;  Location: [facility] ENDOSCOPY;  Service: Cardiovascular;  Laterality: N/A;  . CATARACT EXTRACTION Right 03/2017  . CHOLECYSTECTOMY    . CORONARY ARTERY BYPASS GRAFT  2007   (LIMA to the LAD, left radial to obtuse marginal, SVG to first diagonal, SVG to PDA. His ejection fraction to 50-55%  . ESOPHAGOGASTRODUODENOSCOPY N/A 10/19/2012   Procedure: ESOPHAGOGASTRODUODENOSCOPY (EGD);  Surgeon: Hart Carwin, MD;  Location[facility] ENDOSCOPY;  Service: Endoscopy;  Laterality: N/A;  the dilatation is possible.   Marland Kitchen FOOT SURGERY     to tendon release and  repair of osteomyelitis   . LARYNGOSCOPY N/A 01/21/2014   Procedure: LARYNGOSCOPY;  Surgeon: Christia Reading, MD;  Location: [facility] OR;  Service: ENT;  Laterality: N/A;  micro direct laryngoscopy with prolaryn injection/jet venturi ventilation  . TOE AMPUTATION  4-12/ 3 14   d/t a deformity, found to have osteomyelitis, complicated by post-op foot strtess FX   ROS      As stated in the HPI and negative for all other systems.   PHYSICAL EXAM BP 127/76   Pulse 79   Ht 6' (1.829 m)  Wt 215 lb (97.5 kg)   SpO2 92%   BMI 29.16 kg/m   GENERAL:  Well appearing NECK:  No jugular venous distention, waveform within normal limits, carotid upstroke brisk and symmetric, no bruits, no thyromegaly LUNGS:  Clear to auscultation bilaterally CHEST:  Unremarkable HEART:  PMI not displaced or sustained,S1 and S2 within normal limits, no S3, no S4, no clicks, no rubs, no murmurs ABD:  Flat, positive bowel sounds normal in frequency in pitch, no bruits, no rebound, no guarding, no midline pulsatile mass, no hepatomegaly, no splenomegaly EXT:  2 plus pulses throughout, no edema, no cyanosis no clubbing, severe arthritic changes   Lab Results  Component Value Date   CHOL 135 05/24/2016   TRIG 111.0 05/24/2016   HDL 35.40 (L) 05/24/2016   LDLCALC 77 05/24/2016    Lab Results  Component Value Date   HGBA1C 6.2 03/06/2017    ASSESSMENT AND PLAN  ATRIAL FLUTTER:    He has had ablation and is now off of anticoagulation.  We will continue to observe.  He does not feel his palpitations.    CAD:  The patient has no new sypmtoms.  No further cardiovascular testing is indicated.  We will continue with aggressive risk reduction and meds as listed.  HTN:  The blood pressure is at target. No change in medications is indicated. We will continue with therapeutic lifestyle changes (TLC).  DYSLIPIDEMIA:  He will get a follow up lipid profile and liver enzymes.   DM:  A1C was excellent.  He will continue meds as  listed.

## 2017-05-26 ENCOUNTER — Encounter: Payer: Self-pay | Admitting: Cardiology

## 2017-05-26 ENCOUNTER — Ambulatory Visit (INDEPENDENT_AMBULATORY_CARE_PROVIDER_SITE_OTHER): Payer: Medicare HMO | Admitting: Cardiology

## 2017-05-26 VITALS — BP 127/76 | HR 79 | Ht 72.0 in | Wt 215.0 lb

## 2017-05-26 DIAGNOSIS — I2510 Atherosclerotic heart disease of native coronary artery without angina pectoris: Secondary | ICD-10-CM

## 2017-05-26 DIAGNOSIS — E785 Hyperlipidemia, unspecified: Secondary | ICD-10-CM | POA: Diagnosis not present

## 2017-05-26 DIAGNOSIS — I10 Essential (primary) hypertension: Secondary | ICD-10-CM | POA: Diagnosis not present

## 2017-05-26 HISTORY — PX: CATARACT EXTRACTION: SUR2

## 2017-05-26 NOTE — Telephone Encounter (Signed)
Form completed and faxed to [employer] at [phone]. Form sent for scanning.

## 2017-05-26 NOTE — Patient Instructions (Signed)
Medication Instructions:  Continue current medications  If you need a refill on your cardiac medications before your next appointment, please call your pharmacy.  Labwork: Fasting Lipids and Liver Function   Testing/Procedures: None Ordered  Follow-Up: Your physician wants you to follow-up in: 1 Year. You should receive a reminder letter in the mail two months in advance. If you do not receive a letter, please call our office [phone].    Thank you for choosing [facility] at [facility]!!

## 2017-05-28 ENCOUNTER — Encounter: Payer: Self-pay | Admitting: Cardiology

## 2017-05-28 DIAGNOSIS — G894 Chronic pain syndrome: Secondary | ICD-10-CM | POA: Diagnosis not present

## 2017-05-28 DIAGNOSIS — M069 Rheumatoid arthritis, unspecified: Secondary | ICD-10-CM | POA: Diagnosis not present

## 2017-05-28 DIAGNOSIS — G4700 Insomnia, unspecified: Secondary | ICD-10-CM | POA: Diagnosis not present

## 2017-05-28 DIAGNOSIS — E1142 Type 2 diabetes mellitus with diabetic polyneuropathy: Secondary | ICD-10-CM | POA: Diagnosis not present

## 2017-05-29 DIAGNOSIS — I2510 Atherosclerotic heart disease of native coronary artery without angina pectoris: Secondary | ICD-10-CM | POA: Diagnosis not present

## 2017-05-29 DIAGNOSIS — H2512 Age-related nuclear cataract, left eye: Secondary | ICD-10-CM | POA: Diagnosis not present

## 2017-05-29 DIAGNOSIS — M069 Rheumatoid arthritis, unspecified: Secondary | ICD-10-CM | POA: Diagnosis not present

## 2017-05-29 DIAGNOSIS — E119 Type 2 diabetes mellitus without complications: Secondary | ICD-10-CM | POA: Diagnosis not present

## 2017-05-29 DIAGNOSIS — Z951 Presence of aortocoronary bypass graft: Secondary | ICD-10-CM | POA: Diagnosis not present

## 2017-05-29 DIAGNOSIS — Z7951 Long term (current) use of inhaled steroids: Secondary | ICD-10-CM | POA: Diagnosis not present

## 2017-05-29 DIAGNOSIS — I252 Old myocardial infarction: Secondary | ICD-10-CM | POA: Diagnosis not present

## 2017-05-29 DIAGNOSIS — J449 Chronic obstructive pulmonary disease, unspecified: Secondary | ICD-10-CM | POA: Diagnosis not present

## 2017-05-29 DIAGNOSIS — I4892 Unspecified atrial flutter: Secondary | ICD-10-CM | POA: Diagnosis not present

## 2017-05-29 DIAGNOSIS — K219 Gastro-esophageal reflux disease without esophagitis: Secondary | ICD-10-CM | POA: Diagnosis not present

## 2017-05-29 DIAGNOSIS — H25042 Posterior subcapsular polar age-related cataract, left eye: Secondary | ICD-10-CM | POA: Diagnosis not present

## 2017-06-13 DIAGNOSIS — J449 Chronic obstructive pulmonary disease, unspecified: Secondary | ICD-10-CM | POA: Diagnosis not present

## 2017-06-13 DIAGNOSIS — E785 Hyperlipidemia, unspecified: Secondary | ICD-10-CM | POA: Diagnosis not present

## 2017-06-13 DIAGNOSIS — Z0000 Encounter for general adult medical examination without abnormal findings: Secondary | ICD-10-CM | POA: Diagnosis not present

## 2017-06-13 DIAGNOSIS — K08109 Complete loss of teeth, unspecified cause, unspecified class: Secondary | ICD-10-CM | POA: Diagnosis not present

## 2017-06-13 DIAGNOSIS — K219 Gastro-esophageal reflux disease without esophagitis: Secondary | ICD-10-CM | POA: Diagnosis not present

## 2017-06-13 DIAGNOSIS — E8770 Fluid overload, unspecified: Secondary | ICD-10-CM | POA: Diagnosis not present

## 2017-06-13 DIAGNOSIS — I110 Hypertensive heart disease with heart failure: Secondary | ICD-10-CM | POA: Diagnosis not present

## 2017-06-13 DIAGNOSIS — E669 Obesity, unspecified: Secondary | ICD-10-CM | POA: Diagnosis not present

## 2017-06-13 DIAGNOSIS — I509 Heart failure, unspecified: Secondary | ICD-10-CM | POA: Diagnosis not present

## 2017-06-13 DIAGNOSIS — I2510 Atherosclerotic heart disease of native coronary artery without angina pectoris: Secondary | ICD-10-CM | POA: Diagnosis not present

## 2017-06-20 ENCOUNTER — Telehealth: Payer: Self-pay | Admitting: Internal Medicine

## 2017-06-20 DIAGNOSIS — E1140 Type 2 diabetes mellitus with diabetic neuropathy, unspecified: Secondary | ICD-10-CM

## 2017-06-20 DIAGNOSIS — M059 Rheumatoid arthritis with rheumatoid factor, unspecified: Secondary | ICD-10-CM

## 2017-06-20 NOTE — Telephone Encounter (Addendum)
°  Relation to [id number]  Call back number: [phone] Pharmacy:  Reason for call:  Spouse states Alfredo C. Olean Ree, MD rheumatologist placed lab orders and she would like to schedule appointment on patient behalf, spouse states she has done so in the past,please advise

## 2017-06-20 NOTE — Telephone Encounter (Signed)
Okay to schedule, need to know which orders to place.

## 2017-06-23 NOTE — Telephone Encounter (Signed)
A1c, CBC, and CMP (AST, ALT, albumin and creatinine included) ordered.

## 2017-06-23 NOTE — Telephone Encounter (Signed)
Patient schedule lab only for 06/24/2017

## 2017-06-23 NOTE — Telephone Encounter (Signed)
Patient needs A1C, CBC, AST,ALT, albumin and creatine.

## 2017-06-24 ENCOUNTER — Other Ambulatory Visit (INDEPENDENT_AMBULATORY_CARE_PROVIDER_SITE_OTHER): Payer: Medicare HMO

## 2017-06-24 DIAGNOSIS — E1140 Type 2 diabetes mellitus with diabetic neuropathy, unspecified: Secondary | ICD-10-CM

## 2017-06-24 DIAGNOSIS — M059 Rheumatoid arthritis with rheumatoid factor, unspecified: Secondary | ICD-10-CM

## 2017-06-24 LAB — COMPREHENSIVE METABOLIC PANEL
ALBUMIN: 3.6 g/dL (ref 3.5–5.2)
ALK PHOS: 69 U/L (ref 39–117)
ALT: 23 U/L (ref 0–53)
AST: 22 U/L (ref 0–37)
BILIRUBIN TOTAL: 0.9 mg/dL (ref 0.2–1.2)
BUN: 14 mg/dL (ref 6–23)
CO2: 28 mEq/L (ref 19–32)
Calcium: 9 mg/dL (ref 8.4–10.5)
Chloride: 104 mEq/L (ref 96–112)
Creatinine, Ser: 0.59 mg/dL (ref 0.40–1.50)
GFR: 146.35 mL/min (ref >60.00)
Glucose, Bld: 131 mg/dL — ABNORMAL HIGH (ref 70–99)
POTASSIUM: 3.6 meq/L (ref 3.5–5.1)
Sodium: 140 mEq/L (ref 135–145)
TOTAL PROTEIN: 6.3 g/dL (ref 6.0–8.3)

## 2017-06-24 LAB — CBC WITH DIFFERENTIAL/PLATELET
Basophils Absolute: 0 10*3/uL (ref 0.0–0.1)
Basophils Relative: 0.3 % (ref 0.0–3.0)
EOS PCT: 1.3 % (ref 0.0–5.0)
Eosinophils Absolute: 0 10*3/uL (ref 0.0–0.7)
HEMATOCRIT: 40 % (ref 39.0–52.0)
HEMOGLOBIN: 13.3 g/dL (ref 13.0–17.0)
LYMPHS PCT: 61.4 % — AB (ref 12.0–46.0)
Lymphs Abs: 2.3 10*3/uL (ref 0.7–4.0)
MCHC: 33.2 g/dL (ref 30.0–36.0)
MCV: 91.6 fl (ref 78.0–100.0)
MONOS PCT: 19.4 % — AB (ref 3.0–12.0)
Monocytes Absolute: 0.7 10*3/uL (ref 0.1–1.0)
Neutro Abs: 0.7 10*3/uL — ABNORMAL LOW (ref 1.4–7.7)
Neutrophils Relative %: 17.6 % — ABNORMAL LOW (ref 43.0–77.0)
Platelets: 129 10*3/uL — ABNORMAL LOW (ref 150.0–400.0)
RBC: 4.37 Mil/uL (ref 4.22–5.81)
RDW: 16.3 % — ABNORMAL HIGH (ref 11.5–15.5)
WBC: 3.7 10*3/uL — AB (ref 4.0–10.5)

## 2017-06-24 LAB — HEMOGLOBIN A1C: Hgb A1c MFr Bld: 6.5 % (ref 4.6–6.5)

## 2017-06-24 NOTE — Addendum Note (Signed)
Addended by: Conrad Kasigluk D on: 06/24/2017 10:47 AM   Modules accepted: Orders

## 2017-06-24 NOTE — Telephone Encounter (Signed)
Per Pt- also requesting cholesterol check per [place]? Orders placed.

## 2017-06-26 ENCOUNTER — Ambulatory Visit
Admission: RE | Admit: 2017-06-26 | Discharge: 2017-06-26 | Disposition: A | Discharge status: Home / Self Care | Payer: MEDICARE | Attending: Rheumatology | Admitting: Rheumatology

## 2017-06-26 DIAGNOSIS — M3500 Sicca syndrome, unspecified: Secondary | ICD-10-CM

## 2017-06-26 DIAGNOSIS — D708 Other neutropenia: Secondary | ICD-10-CM

## 2017-06-26 DIAGNOSIS — M069 Rheumatoid arthritis, unspecified: Secondary | ICD-10-CM

## 2017-06-26 DIAGNOSIS — M05772 Rheumatoid arthritis with rheumatoid factor of left ankle and foot without organ or systems involvement: Secondary | ICD-10-CM

## 2017-06-26 DIAGNOSIS — M059 Rheumatoid arthritis with rheumatoid factor, unspecified: Principal | ICD-10-CM

## 2017-06-26 DIAGNOSIS — M05771 Rheumatoid arthritis with rheumatoid factor of right ankle and foot without organ or systems involvement: Secondary | ICD-10-CM

## 2017-06-26 DIAGNOSIS — R768 Other specified abnormal immunological findings in serum: Secondary | ICD-10-CM

## 2017-06-26 DIAGNOSIS — E1142 Type 2 diabetes mellitus with diabetic polyneuropathy: Secondary | ICD-10-CM

## 2017-06-26 DIAGNOSIS — R69 Illness, unspecified: Secondary | ICD-10-CM | POA: Diagnosis not present

## 2017-06-26 MED ORDER — METHOTREXATE SODIUM 25 MG/ML INJECTION SOLUTION
SUBCUTANEOUS | 3 refills | 0.00000 days | Status: CP
Start: 2017-06-26 — End: 2017-11-28

## 2017-06-26 MED ORDER — PREDNISONE 5 MG TABLET
ORAL_TABLET | Freq: Every day | ORAL | 3 refills | 0 days | Status: CP
Start: 2017-06-26 — End: 2017-07-16

## 2017-06-26 MED ORDER — SYRINGE WITH NEEDLE 1 ML 27 X 1/2"
INJECTION | 6 refills | 0.00000 days | Status: CP
Start: 2017-06-26 — End: 2018-01-30

## 2017-07-02 ENCOUNTER — Ambulatory Visit: Payer: Medicare HMO | Admitting: Adult Health

## 2017-07-02 ENCOUNTER — Encounter: Payer: Self-pay | Admitting: Adult Health

## 2017-07-02 DIAGNOSIS — J471 Bronchiectasis with (acute) exacerbation: Secondary | ICD-10-CM | POA: Diagnosis not present

## 2017-07-02 DIAGNOSIS — J439 Emphysema, unspecified: Secondary | ICD-10-CM | POA: Diagnosis not present

## 2017-07-02 MED ORDER — TIOTROPIUM BROMIDE MONOHYDRATE 18 MCG IN CAPS: 18.0000 ug | ORAL_CAPSULE | Freq: Every day | RESPIRATORY_TRACT | 6 refills | Status: DC

## 2017-07-02 MED ORDER — PREDNISONE 10 MG PO TABS: 10.0000 mg | ORAL_TABLET | Freq: Every day | ORAL | 0 refills | Status: AC

## 2017-07-02 MED ORDER — FLUTICASONE-SALMETEROL 250-50 MCG/DOSE IN AEPB: 1.0000 | INHALATION_SPRAY | Freq: Once | RESPIRATORY_TRACT | 5 refills | Status: DC

## 2017-07-02 MED ORDER — AMOXICILLIN-POT CLAVULANATE 875-125 MG PO TABS: 1.0000 | ORAL_TABLET | Freq: Two times a day (BID) | ORAL | 0 refills | Status: AC

## 2017-07-02 NOTE — Assessment & Plan Note (Signed)
Flare  Change to advair and spiriva per pt request  Increase pred to 10 mg daily x 1 wk.   Plan  Patient Instructions  Augmentin 875mg  Twice daily  For 1 week , take with food.  Mucinex DM Twice daily  As needed  Cough /congestion  Change TRELEGY to Advair 1 puff Twice daily  And Spiriva daily  Increase Prednisone 10mg  daily for 1 week then 5mg  daily.  Fluids and rest .  Follow up with Dr.  1 month as planned and As needed   Please contact office for sooner follow up if symptoms do not improve or worsen or seek emergency care   '

## 2017-07-02 NOTE — Progress Notes (Signed)
@Patient  ID: , male    DOB: 15-Oct-1950, 66 y.o.   MRN: [id number]  Chief Complaint  Patient presents with  . Acute Visit    COPD     Referring provider: [id number], MD  HPI: 66 yo male former smoker followed for COPD /Emphysema, RA , chronic aspiration /reflux, Bronchiectasis , O2 RF     Pulmonary tests PFT 01/12/09 >> FEV1 3.02 (85%), FEV1% 68, TLC 6.35 (88%), DLCO 45%, no BD PFT 12/02/12 >> FEV1 3.11 (78%), FEV1% 77, TLC 6.32 (83%), DLCO 37%, no BD  CT chests CT chest 10/17/12 >> severe centrilobular emphysema, apical bullae, 1 cm nodule LUL new, 5 mm nodule RLL stable since 2009, BTX with GGO at bases CT chest 05/06/13 >> decreased size LUL nodule now 8 mm, 4 mm nodule RLL, 3 mm nodule RML, 7 mm nodule RML, 8 mm nodule lingula CT chest 04/05/14 >> atherosclerosis, moderate diffuse centrilobular and paraseptal emphysema, stable pulmonary nodules with dominant LUL nodule now 7 mm, steatosis of liver with ?early cirrhosis CT chest 05/02/15 >> stable pulmonary nodules since 2009, severe emphysema CT angio chest 01/19/16 >> bullous emphysema CT angio chest 02/23/16 >> new patchy ASD Rt > Lt lower lobes  Cardiac tests Echo 01/21/16 >> EF 50 to 55%, grade 1 DD, mild LVH, mild AS, mild RV systolic dysfx   01/23/16 Acute OV : Cough  Pt presents for an acute office visit. Complains of 5 days of cough, congestion , fatigue , cough is keep up night , soreness in chest from coughing so hard.  No n/v. Appetite is down.  Drinking lots of fluids.  Taking mucinex dm without much help. No chest pain, orthopnea, edema or hemoptysis . No fever.   On O2 3l/m .   Was changed to [facility] last visit . He does not like it. Leaves a bad taste in mouth . Wants to go back on Advair and Spriva   Has RA on MTX. This has been on hold for last week.  On prednisone 5mg  daily.    No Known Allergies  Immunization History  Administered Date(s) Administered  . Influenza Split 06/26/2011,  05/08/2012  . Influenza Whole 06/24/2007, 04/26/2008, 06/21/2009  . Influenza, High Dose Seasonal PF 05/13/2017  . Influenza,inj,Quad PF,6+ Mos 04/26/2013, 05/11/2014, 05/16/2015, 05/09/2016  . Pneumococcal Conjugate-13 05/11/2014  . Pneumococcal Polysaccharide-23 06/09/2006, 04/09/2011, 05/13/2017  . Tdap 05/08/2012    Past Medical History:  Diagnosis Date  . CAD (coronary artery disease)    MI 2007, CABG  . Cataracts, bilateral   . Complication of anesthesia    DIFFICULTY AFTER CABG HAD CONFUSION POST OP  . COPD (chronic obstructive pulmonary disease) (HCC)   . Diabetes mellitus    Dx 07-2008 A1C 6.2  . Esophageal dysmotility   . Fatty liver 10/11/09  . Gastroesophageal reflux disease   . Gastroparesis   . Hiatal hernia   . Hypertension   . Neuropathy    (related to RA per neuro note 05-2011)  . Onychomycosis   . Osteoporosis    per DEXA 05-2008  . Oxygen dependent    3l  . Peripheral polyneuropathy    Severe, causing problems with his feet, see surgeries  . Psoriasis   . Rheumatoid arthritis [facility])    [facility] Rheumatology    Tobacco History: Social History   Tobacco Use  Smoking Status Former Smoker  . Packs/day: 1.00  . Years: 40.00  . Pack years: 40.00  . Last attempt  to quit: 08/26/2005  . Years since quitting: 11.8  Smokeless Tobacco Former Neurosurgeon  . Quit date: 04/02/2006  Tobacco Comment   smoked x 40 years, 1 ppd   Counseling given: Not Answered Comment: smoked x 40 years, 1 ppd   Outpatient Encounter Medications as of 07/02/2017  Medication Sig  . albuterol (PROAIR HFA) 108 (90 Base) MCG/ACT inhaler Inhale 2 puffs into the lungs every 4 (four) hours as needed for wheezing or shortness of breath.  . Ascorbic Acid (VITAMIN C) 500 MG tablet Take 500 mg by mouth daily.    Marland Kitchen aspirin EC 81 MG tablet Take 81 mg by mouth daily.  Marland Kitchen atorvastatin (LIPITOR) 80 MG tablet TAKE 1 TABLET(80 MG) BY MOUTH DAILY  . B Complex-C (SUPER B COMPLEX PO) Take by mouth daily.  .  B-D TB SYRINGE 1CC/27GX1/2" 27G X 1/2" 1 ML MISC Reported on 02/16/2016  . Carbonyl Iron (PERFECT IRON PO) Take 1 tablet by mouth every morning.   . clobetasol cream (TEMOVATE) 0.05 % Apply 1 application topically 2 (two) times daily.  . folic acid (FOLVITE) 800 MCG tablet Take 800 mcg by mouth daily.  . furosemide (LASIX) 20 MG tablet TAKE 2 TABLETS BY MOUTH EVERY MORNING AND TAKE 1 TABLET EVERY EVENING AT 4:00PM  . hydrocortisone cream 1 % Apply 1 application topically 3 (three) times daily as needed for itching (skin irritation).  . Lancets (ONETOUCH ULTRASOFT) lancets USE TO TEST BLOOD SUGAR NO MORE THAN TWICE DAILY  . Loteprednol Etabonate (LOTEMAX) 0.5 % GEL Place 1 drop into both eyes every morning. daily  . methotrexate 50 MG/2ML injection Inject 25 mg into the muscle every Thursday.   . metoprolol tartrate (LOPRESSOR) 25 MG tablet TAKE 1 TABLET(25 MG) BY MOUTH TWICE DAILY  . Misc. Devices (ACAPELLA) MISC Use as directed  . nitroGLYCERIN (NITROSTAT) 0.4 MG SL tablet Place 1 tablet (0.4 mg total) under the tongue every 5 (five) minutes as needed for chest pain (up to 3 doses for severe chest pain. If no relief after 3rd dose, proceed to the ED for an evaluation).  . NON FORMULARY EVERGO PORTABLE PULSE DOSE OXYGEN oxygen 3L per nasal cannula 24 hours a day Dx: 496  . ONE TOUCH ULTRA TEST test strip USE TO TEST BLOOD SUGAR NO MORE THAN TWICE DAILY  . oxyCODONE-acetaminophen (PERCOCET) 7.5-325 MG per tablet Take 1 tablet by mouth 3 (three) times daily. 3 times a day  . pantoprazole (PROTONIX) 40 MG tablet Take 1 tablet (40 mg total) by mouth 2 (two) times daily.  . potassium chloride (K-DUR) 10 MEQ tablet TAKE 4 TABLETS BY MOUTH EVERY MORNING AND TAKE 2 TABLETS BY MOUTH EVERY EVENING  . predniSONE (DELTASONE) 5 MG tablet Take 5 mg by mouth daily. Take as directed  . pregabalin (LYRICA) 75 MG capsule Take 75 mg by mouth 2 (two) times daily.  Marland Kitchen Respiratory Therapy Supplies (FLUTTER) DEVI 1  Inhaler by Does not apply route 2 (two) times daily.  . vitamin B-12 (CYANOCOBALAMIN) 250 MCG tablet Take 250 mcg by mouth daily.    Marland Kitchen amoxicillin-clavulanate (AUGMENTIN) 875-125 MG tablet Take 1 tablet 2 (two) times daily for 7 days by mouth.  . Fluticasone-Salmeterol (ADVAIR DISKUS) 250-50 MCG/DOSE AEPB Inhale 1 puff once for 1 dose into the lungs.  . predniSONE (DELTASONE) 10 MG tablet Take 1 tablet (10 mg total) daily with breakfast for 7 days by mouth.  . tiotropium (SPIRIVA) 18 MCG inhalation capsule Place 1 capsule (18 mcg total)  daily into inhaler and inhale.  . [DISCONTINUED] Fluticasone-Umeclidin-Vilant (TRELEGY ELLIPTA) 100-62.5-25 MCG/INH AEPB Inhale 1 puff into the lungs daily. (Patient not taking: Reported on 07/02/2017)   No facility-administered encounter medications on file as of 07/02/2017.      Review of Systems  Constitutional:   No  weight loss, night sweats,  Fevers, chills,  +fatigue, or  lassitude.  HEENT:   No headaches,  Difficulty swallowing,  Tooth/dental problems, or  Sore throat,                No sneezing, itching, ear ache,  +nasal congestion, post nasal drip,   CV:  No chest pain,  Orthopnea, PND, swelling in lower extremities, anasarca, dizziness, palpitations, syncope.   GI  No heartburn, indigestion, abdominal pain, nausea, vomiting, diarrhea, change in bowel habits, loss of appetite, bloody stools.   Resp:   No chest wall deformity  Skin: no rash or lesions.  GU: no dysuria, change in color of urine, no urgency or frequency.  No flank pain, no hematuria   MS:  No joint pain or swelling.  No decreased range of motion.  No back pain.    Physical Exam  BP 120/78 (BP Location: Left Arm, Cuff Size: Normal)   Pulse 83   Ht 6' (1.829 m)   Wt 219 lb (99.3 kg)   SpO2 93%   BMI 29.70 kg/m   GEN: A/Ox3; pleasant , NAD, elderly , on o2    HEENT:  [place]/AT,  EACs-clear, TMs-wnl, NOSE-clear, THROAT-clear, no lesions, no postnasal drip or exudate noted.    NECK:  Supple w/ fair ROM; no JVD; normal carotid impulses w/o bruits; no thyromegaly or nodules palpated; no lymphadenopathy.    RESP  Scattered rhonchi  no accessory muscle use, no dullness to percussion  CARD:  RRR, no m/r/g, no peripheral edema, pulses intact, no cyanosis or clubbing.  GI:   Soft & nt; nml bowel sounds; no organomegaly or masses detected.   Musco: Warm bil, arthritic changes   Neuro: alert, no focal deficits noted.    Skin: Warm, no lesions or rashes    Lab Results: BMET  BNP  Imaging: No results found.   Assessment & Plan:   Bronchiectasis Exacerbation   Plan  Patient Instructions  Augmentin 875mg  Twice daily  For 1 week , take with food.  Mucinex DM Twice daily  As needed  Cough /congestion  Change TRELEGY to Advair 1 puff Twice daily  And Spiriva daily  Increase Prednisone 10mg  daily for 1 week then 5mg  daily.  Fluids and rest .  Follow up with Dr.  1 month as planned and As needed   Please contact office for sooner follow up if symptoms do not improve or worsen or seek emergency care      Chronic obstructive pulmonary emphysema Flare  Change to advair and spiriva per pt request  Increase pred to 10 mg daily x 1 wk.   Plan  Patient Instructions  Augmentin 875mg  Twice daily  For 1 week , take with food.  Mucinex DM Twice daily  As needed  Cough /congestion  Change TRELEGY to Advair 1 puff Twice daily  And Spiriva daily  Increase Prednisone 10mg  daily for 1 week then 5mg  daily.  Fluids and rest .  Follow up with Dr.  1 month as planned and As needed   Please contact office for sooner follow up if symptoms do not improve or worsen or seek emergency care   '  Rexene Edison, NP 07/02/2017

## 2017-07-02 NOTE — Patient Instructions (Addendum)
Augmentin 875mg  Twice daily  For 1 week , take with food.  Mucinex DM Twice daily  As needed  Cough /congestion  Change TRELEGY to Advair 1 puff Twice daily  And Spiriva daily  Increase Prednisone 10mg  daily for 1 week then 5mg  daily.  Fluids and rest .  Follow up with Dr.  1 month as planned and As needed   Please contact office for sooner follow up if symptoms do not improve or worsen or seek emergency care

## 2017-07-02 NOTE — Assessment & Plan Note (Signed)
Exacerbation   Plan  Patient Instructions  Augmentin 875mg  Twice daily  For 1 week , take with food.  Mucinex DM Twice daily  As needed  Cough /congestion  Change TRELEGY to Advair 1 puff Twice daily  And Spiriva daily  Increase Prednisone 10mg  daily for 1 week then 5mg  daily.  Fluids and rest .  Follow up with Dr.  1 month as planned and As needed   Please contact office for sooner follow up if symptoms do not improve or worsen or seek emergency care

## 2017-07-03 ENCOUNTER — Other Ambulatory Visit: Payer: Self-pay | Admitting: Pulmonary Disease

## 2017-07-03 NOTE — Progress Notes (Signed)
I have reviewed and agree with assessment/plan.  Nason Conradt, MD [facility] 07/03/2017, 9:49 AM Pager:  [phone]  

## 2017-07-08 ENCOUNTER — Other Ambulatory Visit: Payer: Self-pay | Admitting: Pulmonary Disease

## 2017-07-08 ENCOUNTER — Telehealth: Payer: Self-pay | Admitting: Pulmonary Disease

## 2017-07-08 MED ORDER — FLUTICASONE-UMECLIDIN-VILANT 100-62.5-25 MCG/INH IN AEPB: 1.0000 | INHALATION_SPRAY | Freq: Every day | RESPIRATORY_TRACT | 5 refills | Status: DC

## 2017-07-08 NOTE — Telephone Encounter (Signed)
Have him stop advair and spiriva, and send script for trelegy one puff daily.

## 2017-07-08 NOTE — Telephone Encounter (Signed)
Patient stated he have 3 days of Augmentin left. He still feel the same. He will contact the office if anything changes.

## 2017-07-08 NOTE — Telephone Encounter (Signed)
Spoke with wife, the insurance will cover Advair and Trelegy. She states he could go back on Trelegy but it makes him nauseous but if it is covered. Please advise which medication would like to switch pt to? VS please advise.

## 2017-07-08 NOTE — Telephone Encounter (Signed)
Spoke with pt, states that he has 3 more days of augmentin, so he wishes to take this completely.  Pt states he will call before the weekend if his symptoms are not improved.  Will close encounter.

## 2017-07-08 NOTE — Telephone Encounter (Signed)
Spoke with pt. He is aware of this medication change. Rx has been sent in. Nothing further was needed. 

## 2017-07-08 NOTE — Telephone Encounter (Signed)
Spoke with pt, he is still coughing a lot but fever is gone. He is a little bit better but the cough has white/green color and now he has mucus from his nose. He would like another round of antibiotics. He states he felt better the first 3 days and just finished his ABX. TP please advise.    amoxicillin-clavulanate (AUGMENTIN) 875-125 MG tablet [[id number]]   [facility]

## 2017-07-11 DIAGNOSIS — Z89421 Acquired absence of other right toe(s): Secondary | ICD-10-CM | POA: Diagnosis not present

## 2017-07-11 DIAGNOSIS — E1140 Type 2 diabetes mellitus with diabetic neuropathy, unspecified: Secondary | ICD-10-CM | POA: Diagnosis not present

## 2017-07-11 DIAGNOSIS — M14671 Charcot's joint, right ankle and foot: Secondary | ICD-10-CM | POA: Diagnosis not present

## 2017-07-15 ENCOUNTER — Other Ambulatory Visit: Payer: Self-pay | Admitting: Cardiology

## 2017-07-15 DIAGNOSIS — M059 Rheumatoid arthritis with rheumatoid factor, unspecified: Secondary | ICD-10-CM | POA: Diagnosis not present

## 2017-07-16 MED ORDER — PREDNISONE 5 MG TABLET
ORAL_TABLET | 3 refills | 0 days | Status: CP
Start: 2017-07-16 — End: 2017-09-01

## 2017-07-22 ENCOUNTER — Other Ambulatory Visit: Payer: Self-pay | Admitting: Cardiology

## 2017-07-22 MED ORDER — PREDNISONE 1 MG TABLET
ORAL_TABLET | Freq: Every day | ORAL | 0 refills | 0.00000 days | Status: CP
Start: 2017-07-22 — End: 2017-07-22

## 2017-07-22 MED ORDER — PREDNISONE 2.5 MG TABLET
ORAL_TABLET | Freq: Every day | ORAL | 6 refills | 0 days | Status: CP
Start: 2017-07-22 — End: 2017-12-02

## 2017-07-23 ENCOUNTER — Ambulatory Visit (INDEPENDENT_AMBULATORY_CARE_PROVIDER_SITE_OTHER): Payer: Medicare HMO | Admitting: Internal Medicine

## 2017-07-23 ENCOUNTER — Encounter: Payer: Self-pay | Admitting: Internal Medicine

## 2017-07-23 ENCOUNTER — Telehealth: Payer: Self-pay

## 2017-07-23 VITALS — BP 128/76 | HR 58 | Temp 98.0°F | Resp 14 | Ht 72.0 in | Wt 214.2 lb

## 2017-07-23 DIAGNOSIS — Z0000 Encounter for general adult medical examination without abnormal findings: Secondary | ICD-10-CM

## 2017-07-23 LAB — LIPID PANEL
CHOLESTEROL: 140 mg/dL (ref 0–200)
HDL: 30 mg/dL — AB (ref >39.00)
LDL CALC: 70 mg/dL (ref 0–99)
NonHDL: 109.86
Total CHOL/HDL Ratio: 5
Triglycerides: 200 mg/dL — ABNORMAL HIGH (ref 0.0–149.0)
VLDL: 40 mg/dL (ref 0.0–40.0)

## 2017-07-23 MED ORDER — ZOLPIDEM TARTRATE 5 MG PO TABS: 5.0000 mg | ORAL_TABLET | Freq: Every evening | ORAL | 3 refills | Status: DC | PRN

## 2017-07-23 NOTE — Assessment & Plan Note (Signed)
RA:: Closely monitored by rheumatology, they are decreasing the dose of MTX and will stay on prednisone 7.5 mg daily. DM: Last A1c satisfactory, diet control  High cholesterol: On Lipitor, check FLP CAD: Asx H/o A flutter, seems to be in bigeminy, same as before, asx Insomnia: New issue, never tried any medication, tips provided, trial with Ambien 5 mg. rtc 4 months

## 2017-07-23 NOTE — Progress Notes (Signed)
Pre visit review using our clinic review tool, if applicable. No additional management support is needed unless otherwise documented below in the visit note. 

## 2017-07-23 NOTE — Assessment & Plan Note (Addendum)
-  Td 2013; PNM 23:2007 and  2018; prevnar 2015; No zostavax d/t immunosupression;  Extensive discussion regards shingrix which is ok for him.  Had a flu shot (had a reaction when got  PNM 23 and flu like few weeks ago: flu like sx x 3 days, aches, fatigue, low grade T?. No rash, itchy, tongue sweeling, unclear which shot caused sx) -CCS: Never had a colonoscopy ,pt reluctant to proceed, he is high risk for procedures; last iFOB failed, discussed cologuard vs iFOB, will refer to cologuard -Prostate cancer screening:  DRE, PSA wnl 2016 -Labs: FLP

## 2017-07-23 NOTE — Patient Instructions (Addendum)
GO TO THE LAB : Get the blood work     GO TO THE FRONT DESK Schedule your next appointment for a checkup in 4 months  Osteoporosis?  Please discuss with rheumatology  [facility]:  is a new shingles shot.  Okay to proceed when available  For difficulty sleeping: Try Ambien 5 mg at bedtime  HEALTHY SLEEP Sleep hygiene: Basic rules for a good night's sleep  Sleep only as much as you need to feel rested and then get out of bed  Keep a regular sleep schedule  Avoid forcing sleep  Exercise regularly for at least 20 minutes, preferably 4 to 5 hours before bedtime  Avoid caffeinated beverages after lunch  Avoid alcohol near bedtime: no "night cap"  Avoid smoking, especially in the evening  Do not go to bed hungry  Adjust bedroom environment  Avoid prolonged use of light-emitting screens before bedtime   Deal with your worries before bedtime

## 2017-07-23 NOTE — Telephone Encounter (Signed)
Cologuard ordered through [facility] portal. Order confirmation: [id number]. Order confirmation sent for scanning.

## 2017-07-23 NOTE — Progress Notes (Signed)
Subjective:    Patient ID: Joseph [name], male    DOB: 03/13/1951, 66 y.o.   MRN: [id number]  DOS:  07/23/2017 Type of visit - description : cpx Interval history: In general and feeling well, stable. Did  like to address with me chronic insomnia, this is going on for years, broken sleep, he thinks due to pain.  Never tried any medication.    Review of Systems Rheumatoid arthritis is actually more symptomatic here lately No chest pain No palpitations   Other than above, a 14 point review of systems is negative     Past Medical History:  Diagnosis Date  . CAD (coronary artery disease)    MI 2007, CABG  . Cataracts, bilateral   . Complication of anesthesia    DIFFICULTY AFTER CABG HAD CONFUSION POST OP  . COPD (chronic obstructive pulmonary disease) (HCC)   . Diabetes mellitus    Dx 07-2008 A1C 6.2  . Esophageal dysmotility   . Fatty liver 10/11/09  . Gastroesophageal reflux disease   . Gastroparesis   . Hiatal hernia   . Hypertension   . Neuropathy    (related to RA per neuro note 05-2011)  . Onychomycosis   . Osteoporosis    per DEXA 05-2008  . Oxygen dependent    3l  . Peripheral polyneuropathy    Severe, causing problems with his feet, see surgeries  . Psoriasis   . Rheumatoid arthritis [facility])    [facility] Rheumatology    Past Surgical History:  Procedure Laterality Date  . A-FLUTTER ABLATION N/A 11/27/2016   Procedure: A-Flutter Ablation;  Surgeon: Marinus Maw, MD;  Location: [facility] INVASIVE CV LAB;  Service: Cardiovascular;  Laterality: N/A;  . achiles tendon surgery  8/09  . CARDIAC CATHETERIZATION N/A 01/23/2016   Procedure: Left Heart Cath and Cors/Grafts Angiography;  Surgeon: Lyn Records, MD;  Location: [facility] INVASIVE CV LAB;  Service: Cardiovascular;  Laterality: N/A;  . CARDIOVERSION N/A 10/23/2016   Procedure: CARDIOVERSION;  Surgeon: Rollene Rotunda, MD;  Location: [facility] ENDOSCOPY;  Service: Cardiovascular;  Laterality: N/A;  . CATARACT EXTRACTION Right 03/2017   . CATARACT EXTRACTION Left 05/2017  . CHOLECYSTECTOMY    . CORONARY ARTERY BYPASS GRAFT  2007   (LIMA to the LAD, left radial to obtuse marginal, SVG to first diagonal, SVG to PDA. His ejection fraction to 50-55%  . ESOPHAGOGASTRODUODENOSCOPY N/A 10/19/2012   Procedure: ESOPHAGOGASTRODUODENOSCOPY (EGD);  Surgeon: Hart Carwin, MD;  Location: [facility] ENDOSCOPY;  Service: Endoscopy;  Laterality: N/A;  the dilatation is possible.   Marland Kitchen FOOT SURGERY     to tendon release and repair of osteomyelitis   . LARYNGOSCOPY N/A 01/21/2014   Procedure: LARYNGOSCOPY;  Surgeon: Christia Reading, MD;  Location: [facility] OR;  Service: ENT;  Laterality: N/A;  micro direct laryngoscopy with prolaryn injection/jet venturi ventilation  . TOE AMPUTATION  4-12/ 3 14   d/t a deformity, found to have osteomyelitis, complicated by post-op foot strtess FX    Social History   Socioeconomic History  . Marital status: Married    Spouse name: Not on file  . Number of children: 0  . Years of education: Not on file  . Highest education level: Not on file  Social Needs  . Financial resource strain: Not on file  . Food insecurity - worry: Not on file  . Food insecurity - inability: Not on file  . Transportation needs - medical: Not on file  . Transportation needs - non-medical: Not on  file  Occupational History  . Occupation: Disabled-retired    Associate Professor: UNEMPLOYED  Tobacco Use  . Smoking status: Former Smoker    Packs/day: 1.00    Years: 40.00    Pack years: 40.00    Last attempt to quit: 08/26/2005    Years since quitting: 11.9  . Smokeless tobacco: Former Neurosurgeon    Quit date: 04/02/2006  . Tobacco comment: smoked x 40 years, 1 ppd  Substance and Sexual Activity  . Alcohol use: No  . Drug use: No  . Sexual activity: Not on file  Other Topics Concern  . Not on file  Social History Narrative   Household- pt and wife     Family History  Problem Relation Age of Onset  . Heart disease Mother   . Diverticulitis Mother   .  Heart disease Father   . Heart attack Father        x2  . Colon cancer Neg Hx   . Prostate cancer Neg Hx     Allergies as of 07/23/2017   No Known Allergies     Medication List        Accurate as of 07/23/17  5:20 PM. Always use your most recent med list.          ACAPELLA Misc Use as directed   albuterol 108 (90 Base) MCG/ACT inhaler Commonly known as:  PROAIR HFA Inhale 2 puffs into the lungs every 4 (four) hours as needed for wheezing or shortness of breath.   aspirin EC 81 MG tablet Take 81 mg by mouth daily.   atorvastatin 80 MG tablet Commonly known as:  LIPITOR TAKE 1 TABLET(80 MG) BY MOUTH DAILY   B-D TB SYRINGE 1CC/27GX1/2" 27G X 1/2" 1 ML Misc Generic drug:  TUBERCULIN SYR 1CC/27GX1/2" Reported on 02/16/2016   clobetasol cream 0.05 % Commonly known as:  TEMOVATE Apply 1 application topically 2 (two) times daily.   Fluticasone-Umeclidin-Vilant 100-62.5-25 MCG/INH Aepb Commonly known as:  TRELEGY ELLIPTA Inhale 1 puff daily into the lungs.   FLUTTER Devi 1 Inhaler by Does not apply route 2 (two) times daily.   folic acid 800 MCG tablet Commonly known as:  FOLVITE Take 800 mcg by mouth daily.   furosemide 20 MG tablet Commonly known as:  LASIX TAKE 2 TABLETS BY MOUTH EVERY MORNING AND 1 TABLET BY MOUTH AT 4 PM   hydrocortisone cream 1 % Apply 1 application topically 3 (three) times daily as needed for itching (skin irritation).   LOTEMAX 0.5 % Gel Generic drug:  Loteprednol Etabonate Place 1 drop into both eyes every morning. daily   methotrexate 50 MG/2ML injection Inject 7 mg into the muscle once a week.   metoprolol tartrate 25 MG tablet Commonly known as:  LOPRESSOR TAKE 1 TABLET BY MOUTH TWICE A DAY   nitroGLYCERIN 0.4 MG SL tablet Commonly known as:  NITROSTAT Place 1 tablet (0.4 mg total) under the tongue every 5 (five) minutes as needed for chest pain (up to 3 doses for severe chest pain. If no relief after 3rd dose, proceed to the  ED for an evaluation).   NON FORMULARY EVERGO PORTABLE PULSE DOSE OXYGEN oxygen 3L per nasal cannula 24 hours a day Dx: 496   ONE TOUCH ULTRA TEST test strip Generic drug:  glucose blood USE TO TEST BLOOD SUGAR NO MORE THAN TWICE DAILY   onetouch ultrasoft lancets USE TO TEST BLOOD SUGAR NO MORE THAN TWICE DAILY   oxyCODONE-acetaminophen 7.5-325 MG tablet Commonly known  as:  PERCOCET Take 1 tablet by mouth 3 (three) times daily. 3 times a day   pantoprazole 40 MG tablet Commonly known as:  PROTONIX Take 1 tablet (40 mg total) by mouth 2 (two) times daily.   PERFECT IRON PO Take 1 tablet by mouth every morning.   potassium chloride 10 MEQ tablet Commonly known as:  K-DUR TAKE 4 TABLETS BY MOUTH EVERY MORNING AND TAKE 2 TABLETS BY MOUTH EVERY EVENING   predniSONE 5 MG tablet Commonly known as:  DELTASONE Take 10 mg by mouth daily. Take as directed   pregabalin 75 MG capsule Commonly known as:  LYRICA Take 75 mg by mouth 2 (two) times daily.   SUPER B COMPLEX PO Take by mouth daily.   vitamin B-12 250 MCG tablet Commonly known as:  CYANOCOBALAMIN Take 250 mcg by mouth daily.   vitamin C 500 MG tablet Commonly known as:  ASCORBIC ACID Take 500 mg by mouth daily.   zolpidem 5 MG tablet Commonly known as:  AMBIEN Take 1 tablet (5 mg total) by mouth at bedtime as needed for sleep.          Objective:   Physical Exam BP 128/76 (BP Location: Left Arm, Patient Position: Sitting, Cuff Size: Small)   Pulse (!) 58   Temp 98 F (36.7 C) (Oral)   Resp 14   Ht 6' (1.829 m)   Wt 214 lb 4 oz (97.2 kg)   SpO2 95%   BMI 29.06 kg/m     General:   Well developed, well nourished . NAD.  Neck: No  thyromegaly  HEENT:  Normocephalic . Face symmetric, atraumatic Lungs:  CTA B Normal respiratory effort, no intercostal retractions, no accessory muscle use. Heart: regular ? Seems to be in bigeminy .  No pretibial edema bilaterally  Abdomen:  Not distended, soft,  non-tender. No rebound or rigidity.   Skin: Exposed areas without rash. Not pale. Not jaundice Neurologic:  alert & oriented X3.  Speech normal, gait limited but at baseline  Strength symmetric and appropriate for age.  Psych: Cognition and judgment appear intact.  Cooperative with normal attention span and concentration.  Behavior appropriate. No anxious or depressed appearing.    Assessment & Plan:  Assessment  MSK: --RA --- Dr Sharren Bridge --Psoriasis --Osteoporosis Peripheral neuropathy, severe, several feet surgeries DM HTN Hyperlipidemia Pulmonary: --COPD --Pulmonary fibrosis --O2 24/ 7 --Chronic respiratory failure CV: --CAD, CHF, MI and  CABG 2007 --CP- CT chest no PE, cath 01-23-16, one occluded graft, rx medical mngmt  dx: question of pericarditis  --Pericarditis, admitted 01/2016 --New  onset A. Flutter 09-2016, admitted. Ablation 4-18, 12-2016: d/c OAC, rx asa 81; restart OAC if flutter return GI --Barrettts --GERD --esophageal dysmotility --Fatty liver Hoarseness , chronic, saw ENT , s/p botox 2015 , helped tempoarrily   PLAN RA:: Closely monitored by rheumatology, they are decreasing the dose of MTX and will stay on prednisone 7.5 mg daily. DM: Last A1c satisfactory, diet control  High cholesterol: On Lipitor, check FLP CAD: Asx H/o A flutter, seems to be in bigeminy, same as before, asx Insomnia: New issue, never tried any medication, tips provided, trial with Ambien 5 mg. rtc 4 months

## 2017-07-26 DIAGNOSIS — A419 Sepsis, unspecified organism: Secondary | ICD-10-CM

## 2017-07-26 DIAGNOSIS — R6521 Severe sepsis with septic shock: Secondary | ICD-10-CM

## 2017-07-26 HISTORY — DX: Severe sepsis with septic shock: R65.21

## 2017-07-26 HISTORY — DX: Sepsis, unspecified organism: A41.9

## 2017-07-28 ENCOUNTER — Ambulatory Visit (INDEPENDENT_AMBULATORY_CARE_PROVIDER_SITE_OTHER): Payer: Medicare HMO | Admitting: *Deleted

## 2017-07-28 DIAGNOSIS — R001 Bradycardia, unspecified: Secondary | ICD-10-CM

## 2017-07-28 DIAGNOSIS — E1142 Type 2 diabetes mellitus with diabetic polyneuropathy: Secondary | ICD-10-CM | POA: Diagnosis not present

## 2017-07-28 DIAGNOSIS — G894 Chronic pain syndrome: Secondary | ICD-10-CM | POA: Diagnosis not present

## 2017-07-28 DIAGNOSIS — G4700 Insomnia, unspecified: Secondary | ICD-10-CM | POA: Diagnosis not present

## 2017-07-28 DIAGNOSIS — M069 Rheumatoid arthritis, unspecified: Secondary | ICD-10-CM | POA: Diagnosis not present

## 2017-07-28 NOTE — Progress Notes (Signed)
The patient came in as a walk in patient. He had been at the pain clinic where a heart rate of 42 was recorded. They suggested that he come to the office for a EKG.  His heart rate recorded at the office was 39-60. EKG was performed which showed PVC's and a heart rate of 88. EKG was read by the DOD and was advised to follow up with Dr. Antoine Poche. The patient verbalized his understanding and an appointment was made at check out.

## 2017-07-28 NOTE — Progress Notes (Signed)
HPI The patient presents follow up of CAD with bypass in 2007.   In May of last year he was admitted with chest pain and possible pericarditis.   Cardiac cath on 01/23/16 showed occluded saphenous vein graft to diagonal, patent free left radial artery to the obtuse marginal, patent SVG to distal RCA of left Cx, widely patent LIMA to distal LAD, severe native vessel disease with total occlusion of several large diagonals. High-grade obstruction in nondominant RCA and high grade obstruction of proximal and mid LAD with total occlusion distally. Recommendations were for continued medical therapy.  He subsequently was admitted for atrial flutter and later had ablation.  The patient walked into the office yesterday because of bradycardia.  This was noted in the pain clinic.  He had an EKG which demonstrated   sinus rhythm with ventricular trigeminy with a right bundle branch morphology.  The EKG is repeated today and is unchanged.  He feels excellent.  He has had some increased arthritis pain and has been on a higher dose of steroids but has had no cardiac complaints recently.  In particular he does not have any palpitations, presyncope or syncope.  He is not having any chest pressure, neck or arm discomfort.  He has no new shortness of breath, PND or orthopnea.   No Known Allergies  Current Outpatient Medications  Medication Sig Dispense Refill  . albuterol (PROAIR HFA) 108 (90 Base) MCG/ACT inhaler Inhale 2 puffs into the lungs every 4 (four) hours as needed for wheezing or shortness of breath. 1 Inhaler 3  . Ascorbic Acid (VITAMIN C) 500 MG tablet Take 500 mg by mouth daily.      Marland Kitchen aspirin EC 81 MG tablet Take 81 mg by mouth daily.    Marland Kitchen atorvastatin (LIPITOR) 80 MG tablet TAKE 1 TABLET(80 MG) BY MOUTH DAILY 90 tablet 3  . B Complex-C (SUPER B COMPLEX PO) Take by mouth daily.    . B-D TB SYRINGE 1CC/27GX1/2" 27G X 1/2" 1 ML MISC Reported on 02/16/2016    . Carbonyl Iron (PERFECT IRON PO) Take 1 tablet  by mouth every morning.     . clobetasol cream (TEMOVATE) 0.05 % Apply 1 application topically 2 (two) times daily.    . Fluticasone-Umeclidin-Vilant (TRELEGY ELLIPTA) 100-62.5-25 MCG/INH AEPB Inhale 1 puff daily into the lungs. 60 each 5  . folic acid (FOLVITE) 800 MCG tablet Take 800 mcg by mouth daily.    . furosemide (LASIX) 20 MG tablet TAKE 2 TABLETS BY MOUTH EVERY MORNING AND 1 TABLET BY MOUTH AT 4 PM 270 tablet 3  . hydrocortisone cream 1 % Apply 1 application topically 3 (three) times daily as needed for itching (skin irritation). 30 g 0  . Lancets (ONETOUCH ULTRASOFT) lancets USE TO TEST BLOOD SUGAR NO MORE THAN TWICE DAILY 100 each 12  . Loteprednol Etabonate (LOTEMAX) 0.5 % GEL Place 1 drop into both eyes every morning. daily    . methotrexate 50 MG/2ML injection Inject 7 mg into the muscle once a week.     . metoprolol tartrate (LOPRESSOR) 25 MG tablet TAKE 1 TABLET BY MOUTH TWICE A DAY 180 tablet 0  . Misc. Devices (ACAPELLA) MISC Use as directed 1 each 0  . nitroGLYCERIN (NITROSTAT) 0.4 MG SL tablet Place 1 tablet (0.4 mg total) under the tongue every 5 (five) minutes as needed for chest pain (up to 3 doses for severe chest pain. If no relief after 3rd dose, proceed to the ED for  an evaluation). 25 tablet 2  . NON FORMULARY EVERGO PORTABLE PULSE DOSE OXYGEN oxygen 3L per nasal cannula 24 hours a day Dx: 496    . ONE TOUCH ULTRA TEST test strip USE TO TEST BLOOD SUGAR NO MORE THAN TWICE DAILY 100 each 12  . oxyCODONE-acetaminophen (PERCOCET) 7.5-325 MG per tablet Take 1 tablet by mouth 3 (three) times daily. 3 times a day    . pantoprazole (PROTONIX) 40 MG tablet Take 1 tablet (40 mg total) by mouth 2 (two) times daily. 180 tablet 3  . potassium chloride (K-DUR) 10 MEQ tablet TAKE 4 TABLETS BY MOUTH EVERY MORNING AND TAKE 2 TABLETS BY MOUTH EVERY EVENING 180 tablet 9  . predniSONE (DELTASONE) 5 MG tablet Take by mouth daily. PT Take as directed    . pregabalin (LYRICA) 75 MG capsule  Take 75 mg by mouth 2 (two) times daily.    Marland Kitchen Respiratory Therapy Supplies (FLUTTER) DEVI 1 Inhaler by Does not apply route 2 (two) times daily. 1 each 0  . vitamin B-12 (CYANOCOBALAMIN) 250 MCG tablet Take 250 mcg by mouth daily.       No current facility-administered medications for this visit.     Past Medical History:  Diagnosis Date  . CAD (coronary artery disease)    MI 2007, CABG  . Cataracts, bilateral   . Complication of anesthesia    DIFFICULTY AFTER CABG HAD CONFUSION POST OP  . COPD (chronic obstructive pulmonary disease) (HCC)   . Diabetes mellitus    Dx 07-2008 A1C 6.2  . Esophageal dysmotility   . Fatty liver 10/11/09  . Gastroesophageal reflux disease   . Gastroparesis   . Hiatal hernia   . Hypertension   . Neuropathy    (related to RA per neuro note 05-2011)  . Onychomycosis   . Osteoporosis    per DEXA 05-2008  . Oxygen dependent    3l  . Peripheral polyneuropathy    Severe, causing problems with his feet, see surgeries  . Psoriasis   . Rheumatoid arthritis [facility])    [facility] Rheumatology    Past Surgical History:  Procedure Laterality Date  . A-FLUTTER ABLATION N/A 11/27/2016   Procedure: A-Flutter Ablation;  Surgeon: Marinus Maw, MD;  Location: [facility] INVASIVE CV LAB;  Service: Cardiovascular;  Laterality: N/A;  . achiles tendon surgery  8/09  . CARDIAC CATHETERIZATION N/A 01/23/2016   Procedure: Left Heart Cath and Cors/Grafts Angiography;  Surgeon: Lyn Records, MD;  Location: [facility] INVASIVE CV LAB;  Service: Cardiovascular;  Laterality: N/A;  . CARDIOVERSION N/A 10/23/2016   Procedure: CARDIOVERSION;  Surgeon: Rollene Rotunda, MD;  Location: [facility] ENDOSCOPY;  Service: Cardiovascular;  Laterality: N/A;  . CATARACT EXTRACTION Right 03/2017  . CATARACT EXTRACTION Left 05/2017  . CHOLECYSTECTOMY    . CORONARY ARTERY BYPASS GRAFT  2007   (LIMA to the LAD, left radial to obtuse marginal, SVG to first diagonal, SVG to PDA. His ejection fraction to 50-55%  .  ESOPHAGOGASTRODUODENOSCOPY N/A 10/19/2012   Procedure: ESOPHAGOGASTRODUODENOSCOPY (EGD);  Surgeon: Hart Carwin, MD;  Location[facility] ENDOSCOPY;  Service: Endoscopy;  Laterality: N/A;  the dilatation is possible.   Marland Kitchen FOOT SURGERY     to tendon release and repair of osteomyelitis   . LARYNGOSCOPY N/A 01/21/2014   Procedure: LARYNGOSCOPY;  Surgeon: Christia Reading, MD;  Location: [facility] OR;  Service: ENT;  Laterality: N/A;  micro direct laryngoscopy with prolaryn injection/jet venturi ventilation  . TOE AMPUTATION  4-12/ 3 14  d/t a deformity, found to have osteomyelitis, complicated by post-op foot strtess FX   ROS        As stated in the HPI and negative for all other systems.   PHYSICAL EXAM BP (!) 132/58   Pulse 81   Ht 6' (1.829 m)   Wt 214 lb 3.2 oz (97.2 kg)   BMI 29.05 kg/m   GENERAL:  Well appearing NECK:  No jugular venous distention, waveform within normal limits, carotid upstroke brisk and symmetric, no bruits, no thyromegaly LUNGS:  Clear to auscultation bilaterally CHEST:  Unremarkable HEART:  PMI not displaced or sustained,S1 and S2 within normal limits, no S3, no S4, no clicks, no rubs, no murmurs ABD:  Flat, positive bowel sounds normal in frequency in pitch, no bruits, no rebound, no guarding, no midline pulsatile mass, no hepatomegaly, no splenomegaly EXT:  2 plus pulses throughout, no edema, no cyanosis no clubbing, severe arthritic changes.     Lab Results  Component Value Date   CHOL 140 07/23/2017   TRIG 200.0 (H) 07/23/2017   HDL 30.00 (L) 07/23/2017   LDLCALC 70 07/23/2017    Lab Results  Component Value Date   HGBA1C 6.5 06/24/2017    ASSESSMENT AND PLAN  ATRIAL FLUTTER:    He has had no recurrence of this.  No change in therapy is indicated.   CAD:  The patient has no new sypmtoms.  No further cardiovascular testing is indicated.  We will continue with aggressive risk reduction and meds as listed.  HTN:  The blood pressure is at target. No change in  medications is indicated. We will continue with therapeutic lifestyle changes (TLC).  DYSLIPIDEMIA:  His LDL was excellent as above.  No change in therapy.   DM:    A1C was 6.5.  PVCs:  He is having no symptoms related to this.  No change in therapy is indicated.  We gave him a copy of this EKG because he is probably going to continue to have heart rates that are recorded in the 40s because they are under counting the PVCs.  He has not had any true bradycardic rhythm.  It is difficult to assess his pulse at his radial artery.  It is easily palpable at his carotid or by listening to him.  When in doubt an EKG or rhythm strip would be helpful.

## 2017-07-29 ENCOUNTER — Ambulatory Visit: Payer: Medicare HMO | Admitting: Cardiology

## 2017-07-29 ENCOUNTER — Encounter: Payer: Self-pay | Admitting: Cardiology

## 2017-07-29 VITALS — BP 132/58 | HR 81 | Ht 72.0 in | Wt 214.2 lb

## 2017-07-29 DIAGNOSIS — I493 Ventricular premature depolarization: Secondary | ICD-10-CM

## 2017-07-29 DIAGNOSIS — I2510 Atherosclerotic heart disease of native coronary artery without angina pectoris: Secondary | ICD-10-CM | POA: Diagnosis not present

## 2017-07-29 DIAGNOSIS — I10 Essential (primary) hypertension: Secondary | ICD-10-CM

## 2017-07-29 NOTE — Patient Instructions (Signed)
Medication Instructions:  Continue current medications  If you need a refill on your cardiac medications before your next appointment, please call your pharmacy.  Labwork: None Ordered   Testing/Procedures: None Ordered  Follow-Up: Your physician wants you to follow-up in: 1 Year. You should receive a reminder letter in the mail two months in advance. If you do not receive a letter, please call our office [phone].    Thank you for choosing [facility]!!      

## 2017-08-07 DIAGNOSIS — M059 Rheumatoid arthritis with rheumatoid factor, unspecified: Secondary | ICD-10-CM | POA: Diagnosis not present

## 2017-08-07 DIAGNOSIS — M368 Systemic disorders of connective tissue in other diseases classified elsewhere: Secondary | ICD-10-CM | POA: Diagnosis not present

## 2017-08-11 ENCOUNTER — Ambulatory Visit: Payer: Medicare HMO | Admitting: Pulmonary Disease

## 2017-08-11 ENCOUNTER — Encounter: Payer: Self-pay | Admitting: Pulmonary Disease

## 2017-08-11 VITALS — BP 118/70 | HR 81 | Temp 98.0°F | Ht 72.0 in | Wt 216.4 lb

## 2017-08-11 DIAGNOSIS — J9611 Chronic respiratory failure with hypoxia: Secondary | ICD-10-CM | POA: Diagnosis not present

## 2017-08-11 DIAGNOSIS — G4700 Insomnia, unspecified: Secondary | ICD-10-CM

## 2017-08-11 DIAGNOSIS — J069 Acute upper respiratory infection, unspecified: Secondary | ICD-10-CM

## 2017-08-11 DIAGNOSIS — J439 Emphysema, unspecified: Secondary | ICD-10-CM | POA: Diagnosis not present

## 2017-08-11 DIAGNOSIS — J479 Bronchiectasis, uncomplicated: Secondary | ICD-10-CM

## 2017-08-11 MED ORDER — AMOXICILLIN-POT CLAVULANATE 875-125 MG PO TABS: 1.0000 | ORAL_TABLET | Freq: Two times a day (BID) | ORAL | 0 refills | Status: DC

## 2017-08-11 NOTE — Progress Notes (Signed)
[facility]  Chief Complaint  Patient presents with  . Follow-up    Pt has head and chest cold; his wife was sick and patient has had it since Saturday. Pt has productive cough- green mucus, postnasal drip.     Vital signs: BP 118/70 (BP Location: Left Arm, Cuff Size: Normal)   Pulse 81   Temp 98 F (36.7 C)   Ht 6' (1.829 m)   Wt 216 lb 6.4 oz (98.2 kg)   SpO2 95%   BMI 29.35 kg/m   History of Present Illness: Joseph [name] is a 66 y.o. male former smoker with COPD/emphysema, rheumatoid arthritis, recurrent pneumonia 2nd to chronic aspiration/reflux, bronchiectasis, chronic respiratory failure on 3 liters oxygen 24/7.  Had flare up in November.  Was tx with augmentin.  Changed to advair and spiriva.  Increased prednisone for several days.  Spiriva wasn't covered, so changed back to trelegy.  His wife got a upper respiratory infection, and he picked this up.  He is having sinus congestion, and is worried this is settling into his chest.  He is not having fever.  He denies chest pain, or hemoptysis.  He has trouble staying asleep.  He falls asleep around 9 pm.  He sleeps for about 3 hours, and then his wife wakes him up to go into the bedroom.  He then has trouble falling back to sleep.  He also gets pains in his hands and feet, and these keep him up.  He has been using lyrica and elavil.  He is followed by pain specialist.  He ends up taking a nap in the afternoon for 3 to 5 hours.   Physical Exam:  General - pleasant, wearing oxygen Eyes - pupils reactive ENT - crusting around nares, no sinus tenderness, no oral exudate, no LAN Cardiac - regular, no murmur Chest - no wheeze, rales Abd - soft, non tender Ext - extensive changes of RA Skin - no rashes Neuro - normal strength Psych - normal mood   Assessment/Plan:  Upper respiratory infection. - likely viral - will send script for augmentin to keep handy when he travels to  [place], but advised not to fill unless his symptoms get worse  COPD with bullous emphysema. - continue trelegy and prn albuterol  Bronchiectasis. - continue bronchial hygiene  Recurrent aspiration PNA with hx of GERD, HH, and gastroparesis. - continue bid protonix per GI  Chronic respiratory failure. - Related to COPD/Emphysema. - continue 3 liters oxygen 24/7  Hx of Rheumatoid arthritis. - he is on MTX, and chronic prednisone per rheumatology, Dr. Annita Brod at [facility]  Insomnia. - discussed options to help consolidate his sleep - would prefer to avoid adding additional sleep aide medications  Neuropathy. - advised him to d/w his pain specialist about whether there are additional therapies such as cymbalta   Patient Instructions  Can fill script for augmentin if your symptoms get worse  Try consolidating your sleep to one longer period at night  Follow up in 4 months    [name], MD [name] Pulmonary/Critical Care 08/11/2017, 12:53 PM Pager:  [phone]  Flow Sheet  Pulmonary tests PFT 01/12/09 >> FEV1 3.02 (85%), FEV1% 68, TLC 6.35 (88%), DLCO 45%, no BD PFT 12/02/12 >> FEV1 3.11 (78%), FEV1% 77, TLC 6.32 (83%), DLCO 37%, no BD  CT chests CT chest 10/17/12 >> severe centrilobular emphysema, apical bullae, 1 cm nodule LUL new, 5 mm nodule RLL stable since 2009, BTX with  GGO at bases CT chest 05/06/13 >> decreased size LUL nodule now 8 mm, 4 mm nodule RLL, 3 mm nodule RML, 7 mm nodule RML, 8 mm nodule lingula CT chest 04/05/14 >> atherosclerosis, moderate diffuse centrilobular and paraseptal emphysema, stable pulmonary nodules with dominant LUL nodule now 7 mm, steatosis of liver with ?early cirrhosis CT chest 05/02/15 >> stable pulmonary nodules since 2009, severe emphysema CT angio chest 01/19/16 >> bullous emphysema CT angio chest 02/23/16 >> new patchy ASD Rt > Lt lower lobes  Cardiac tests Echo 10/03/16 >> EF 55 to 60%, mild RV systolic dysfx, mod TR,  PAS 39 mmHg  Events:  Past Medical History: He  has a past medical history of CAD (coronary artery disease), Cataracts, bilateral, Complication of anesthesia, COPD (chronic obstructive pulmonary disease) (HCC), Diabetes mellitus, Esophageal dysmotility, Fatty liver (10/11/09), Gastroesophageal reflux disease, Gastroparesis, Hiatal hernia, Hypertension, Neuropathy, Onychomycosis, Osteoporosis, Oxygen dependent, Peripheral polyneuropathy, Psoriasis, and Rheumatoid arthritis (HCC).  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  Family History: His family history includes Diverticulitis in his mother; Heart attack in his father; Heart disease in his father and mother.  Social History: He  reports that he quit smoking about 11 years ago. He has a 40.00 pack-year smoking history. He quit smokeless tobacco use about 11 years ago. He reports that he does not drink alcohol or use drugs.  Medications: Allergies as of 08/11/2017   No Known Allergies     Medication List        Accurate as of 08/11/17 12:53 PM. Always use your most recent med list.          ACAPELLA Misc Use as directed   albuterol 108 (90 Base) MCG/ACT inhaler Commonly known as:  PROAIR HFA Inhale 2 puffs into the lungs every 4 (four) hours as needed for wheezing or shortness of breath.   amitriptyline 10 MG tablet Commonly known as:  ELAVIL Take 10 mg by mouth at bedtime.   amoxicillin-clavulanate 875-125 MG tablet Commonly known as:  AUGMENTIN Take 1 tablet by mouth 2 (two) times daily.   aspirin EC 81 MG  tablet Take 81 mg by mouth daily.   atorvastatin 80 MG tablet Commonly known as:  LIPITOR TAKE 1 TABLET(80 MG) BY MOUTH DAILY   B-D TB SYRINGE 1CC/27GX1/2" 27G X 1/2" 1 ML Misc Generic drug:  TUBERCULIN SYR 1CC/27GX1/2" Reported on 02/16/2016   clobetasol cream 0.05 % Commonly known as:  TEMOVATE Apply 1 application topically 2 (two) times daily.   Fluticasone-Umeclidin-Vilant 100-62.5-25 MCG/INH Aepb Commonly known as:  TRELEGY ELLIPTA Inhale 1 puff daily into the lungs.   FLUTTER Devi 1 Inhaler by Does not apply route 2 (two) times daily.   folic acid 800 MCG tablet Commonly known as:  FOLVITE Take 800 mcg by mouth daily.   furosemide 20 MG tablet Commonly known as:  LASIX TAKE 2 TABLETS BY MOUTH EVERY MORNING AND 1 TABLET BY MOUTH AT 4 PM   hydrocortisone cream 1 % Apply 1 application topically 3 (three) times daily as needed for itching (skin irritation).   LOTEMAX 0.5 % Gel Generic drug:  Loteprednol Etabonate Place 1 drop into both eyes every morning. daily   methotrexate 50 MG/2ML injection  Inject 7 mg into the muscle once a week.   metoprolol tartrate 25 MG tablet Commonly known as:  LOPRESSOR TAKE 1 TABLET BY MOUTH TWICE A DAY   nitroGLYCERIN 0.4 MG SL tablet Commonly known as:  NITROSTAT Place 1 tablet (0.4 mg total) under the tongue every 5 (five) minutes as needed for chest pain (up to 3 doses for severe chest pain. If no relief after 3rd dose, proceed to the ED for an evaluation).   NON FORMULARY EVERGO PORTABLE PULSE DOSE OXYGEN oxygen 3L per nasal cannula 24 hours a day Dx: 496   ONE TOUCH ULTRA TEST test strip Generic drug:  glucose blood USE TO TEST BLOOD SUGAR NO MORE THAN TWICE DAILY   onetouch ultrasoft lancets USE TO TEST BLOOD SUGAR NO MORE THAN TWICE DAILY   oxyCODONE-acetaminophen 7.5-325 MG tablet Commonly known as:  PERCOCET Take 1 tablet by mouth 3 (three) times daily. 3 times a day   pantoprazole 40 MG tablet Commonly known  as:  PROTONIX Take 1 tablet (40 mg total) by mouth 2 (two) times daily.   PERFECT IRON PO Take 1 tablet by mouth every morning.   potassium chloride 10 MEQ tablet Commonly known as:  K-DUR TAKE 4 TABLETS BY MOUTH EVERY MORNING AND TAKE 2 TABLETS BY MOUTH EVERY EVENING   predniSONE 5 MG tablet Commonly known as:  DELTASONE Take by mouth daily. PT Take as directed   pregabalin 75 MG capsule Commonly known as:  LYRICA Take 75 mg by mouth 2 (two) times daily.   SUPER B COMPLEX PO Take by mouth daily.   vitamin B-12 250 MCG tablet Commonly known as:  CYANOCOBALAMIN Take 250 mcg by mouth daily.   vitamin C 500 MG tablet Commonly known as:  ASCORBIC ACID Take 500 mg by mouth daily.

## 2017-08-11 NOTE — Patient Instructions (Signed)
Can fill script for augmentin if your symptoms get worse  Try consolidating your sleep to one longer period at night  Follow up in 4 months

## 2017-08-12 DIAGNOSIS — Z0101 Encounter for examination of eyes and vision with abnormal findings: Secondary | ICD-10-CM | POA: Diagnosis not present

## 2017-08-14 ENCOUNTER — Inpatient Hospital Stay (HOSPITAL_COMMUNITY)
Admission: EM | Admit: 2017-08-14 | Discharge: 2017-08-22 | DRG: 871 | Disposition: A | Discharge status: Home Health | Payer: Medicare HMO | Attending: Internal Medicine | Admitting: Internal Medicine

## 2017-08-14 ENCOUNTER — Inpatient Hospital Stay (HOSPITAL_COMMUNITY): Payer: Medicare HMO

## 2017-08-14 ENCOUNTER — Emergency Department (HOSPITAL_COMMUNITY): Payer: Medicare HMO

## 2017-08-14 ENCOUNTER — Encounter (HOSPITAL_COMMUNITY): Payer: Self-pay

## 2017-08-14 DIAGNOSIS — J90 Pleural effusion, not elsewhere classified: Secondary | ICD-10-CM | POA: Diagnosis not present

## 2017-08-14 DIAGNOSIS — I5032 Chronic diastolic (congestive) heart failure: Secondary | ICD-10-CM | POA: Diagnosis present

## 2017-08-14 DIAGNOSIS — Z79899 Other long term (current) drug therapy: Secondary | ICD-10-CM

## 2017-08-14 DIAGNOSIS — Z8701 Personal history of pneumonia (recurrent): Secondary | ICD-10-CM

## 2017-08-14 DIAGNOSIS — R197 Diarrhea, unspecified: Secondary | ICD-10-CM | POA: Diagnosis present

## 2017-08-14 DIAGNOSIS — R509 Fever, unspecified: Secondary | ICD-10-CM | POA: Diagnosis not present

## 2017-08-14 DIAGNOSIS — Z01818 Encounter for other preprocedural examination: Secondary | ICD-10-CM

## 2017-08-14 DIAGNOSIS — I2510 Atherosclerotic heart disease of native coronary artery without angina pectoris: Secondary | ICD-10-CM | POA: Diagnosis present

## 2017-08-14 DIAGNOSIS — R6521 Severe sepsis with septic shock: Secondary | ICD-10-CM | POA: Diagnosis not present

## 2017-08-14 DIAGNOSIS — E871 Hypo-osmolality and hyponatremia: Secondary | ICD-10-CM | POA: Diagnosis not present

## 2017-08-14 DIAGNOSIS — K224 Dyskinesia of esophagus: Secondary | ICD-10-CM | POA: Diagnosis present

## 2017-08-14 DIAGNOSIS — K3184 Gastroparesis: Secondary | ICD-10-CM | POA: Diagnosis present

## 2017-08-14 DIAGNOSIS — J329 Chronic sinusitis, unspecified: Secondary | ICD-10-CM | POA: Diagnosis present

## 2017-08-14 DIAGNOSIS — D696 Thrombocytopenia, unspecified: Secondary | ICD-10-CM | POA: Diagnosis present

## 2017-08-14 DIAGNOSIS — I110 Hypertensive heart disease with heart failure: Secondary | ICD-10-CM | POA: Diagnosis not present

## 2017-08-14 DIAGNOSIS — J479 Bronchiectasis, uncomplicated: Secondary | ICD-10-CM | POA: Diagnosis present

## 2017-08-14 DIAGNOSIS — M05711 Rheumatoid arthritis with rheumatoid factor of right shoulder without organ or systems involvement: Secondary | ICD-10-CM | POA: Diagnosis not present

## 2017-08-14 DIAGNOSIS — Z7952 Long term (current) use of systemic steroids: Secondary | ICD-10-CM | POA: Diagnosis not present

## 2017-08-14 DIAGNOSIS — N200 Calculus of kidney: Secondary | ICD-10-CM | POA: Diagnosis not present

## 2017-08-14 DIAGNOSIS — R410 Disorientation, unspecified: Secondary | ICD-10-CM | POA: Diagnosis present

## 2017-08-14 DIAGNOSIS — Z4682 Encounter for fitting and adjustment of non-vascular catheter: Secondary | ICD-10-CM | POA: Diagnosis not present

## 2017-08-14 DIAGNOSIS — Z87891 Personal history of nicotine dependence: Secondary | ICD-10-CM

## 2017-08-14 DIAGNOSIS — E1142 Type 2 diabetes mellitus with diabetic polyneuropathy: Secondary | ICD-10-CM | POA: Diagnosis present

## 2017-08-14 DIAGNOSIS — A419 Sepsis, unspecified organism: Secondary | ICD-10-CM | POA: Diagnosis not present

## 2017-08-14 DIAGNOSIS — Z9981 Dependence on supplemental oxygen: Secondary | ICD-10-CM | POA: Diagnosis not present

## 2017-08-14 DIAGNOSIS — M05741 Rheumatoid arthritis with rheumatoid factor of right hand without organ or systems involvement: Secondary | ICD-10-CM | POA: Diagnosis not present

## 2017-08-14 DIAGNOSIS — R531 Weakness: Secondary | ICD-10-CM | POA: Diagnosis not present

## 2017-08-14 DIAGNOSIS — M05742 Rheumatoid arthritis with rheumatoid factor of left hand without organ or systems involvement: Secondary | ICD-10-CM | POA: Diagnosis not present

## 2017-08-14 DIAGNOSIS — E1140 Type 2 diabetes mellitus with diabetic neuropathy, unspecified: Secondary | ICD-10-CM | POA: Diagnosis present

## 2017-08-14 DIAGNOSIS — G43909 Migraine, unspecified, not intractable, without status migrainosus: Secondary | ICD-10-CM | POA: Diagnosis present

## 2017-08-14 DIAGNOSIS — M810 Age-related osteoporosis without current pathological fracture: Secondary | ICD-10-CM | POA: Diagnosis present

## 2017-08-14 DIAGNOSIS — J9621 Acute and chronic respiratory failure with hypoxia: Secondary | ICD-10-CM | POA: Diagnosis not present

## 2017-08-14 DIAGNOSIS — J449 Chronic obstructive pulmonary disease, unspecified: Secondary | ICD-10-CM | POA: Diagnosis not present

## 2017-08-14 DIAGNOSIS — Z9049 Acquired absence of other specified parts of digestive tract: Secondary | ICD-10-CM

## 2017-08-14 DIAGNOSIS — E1165 Type 2 diabetes mellitus with hyperglycemia: Secondary | ICD-10-CM | POA: Diagnosis present

## 2017-08-14 DIAGNOSIS — R0989 Other specified symptoms and signs involving the circulatory and respiratory systems: Secondary | ICD-10-CM | POA: Diagnosis not present

## 2017-08-14 DIAGNOSIS — M069 Rheumatoid arthritis, unspecified: Secondary | ICD-10-CM | POA: Diagnosis present

## 2017-08-14 DIAGNOSIS — Z951 Presence of aortocoronary bypass graft: Secondary | ICD-10-CM | POA: Diagnosis not present

## 2017-08-14 DIAGNOSIS — E876 Hypokalemia: Secondary | ICD-10-CM | POA: Diagnosis present

## 2017-08-14 DIAGNOSIS — K2270 Barrett's esophagus without dysplasia: Secondary | ICD-10-CM | POA: Diagnosis present

## 2017-08-14 DIAGNOSIS — K219 Gastro-esophageal reflux disease without esophagitis: Secondary | ICD-10-CM | POA: Diagnosis present

## 2017-08-14 DIAGNOSIS — E118 Type 2 diabetes mellitus with unspecified complications: Secondary | ICD-10-CM | POA: Diagnosis not present

## 2017-08-14 DIAGNOSIS — R4182 Altered mental status, unspecified: Secondary | ICD-10-CM

## 2017-08-14 DIAGNOSIS — Z7982 Long term (current) use of aspirin: Secondary | ICD-10-CM

## 2017-08-14 DIAGNOSIS — R000 Tachycardia, unspecified: Secondary | ICD-10-CM | POA: Diagnosis not present

## 2017-08-14 DIAGNOSIS — I4891 Unspecified atrial fibrillation: Secondary | ICD-10-CM | POA: Diagnosis present

## 2017-08-14 DIAGNOSIS — J9600 Acute respiratory failure, unspecified whether with hypoxia or hypercapnia: Secondary | ICD-10-CM | POA: Diagnosis not present

## 2017-08-14 DIAGNOSIS — E1143 Type 2 diabetes mellitus with diabetic autonomic (poly)neuropathy: Secondary | ICD-10-CM | POA: Diagnosis present

## 2017-08-14 DIAGNOSIS — Z66 Do not resuscitate: Secondary | ICD-10-CM | POA: Diagnosis not present

## 2017-08-14 DIAGNOSIS — G9341 Metabolic encephalopathy: Secondary | ICD-10-CM | POA: Diagnosis present

## 2017-08-14 DIAGNOSIS — J9611 Chronic respiratory failure with hypoxia: Secondary | ICD-10-CM | POA: Diagnosis present

## 2017-08-14 DIAGNOSIS — L8990 Pressure ulcer of unspecified site, unspecified stage: Secondary | ICD-10-CM | POA: Diagnosis present

## 2017-08-14 DIAGNOSIS — J439 Emphysema, unspecified: Secondary | ICD-10-CM | POA: Diagnosis not present

## 2017-08-14 DIAGNOSIS — Z452 Encounter for adjustment and management of vascular access device: Secondary | ICD-10-CM

## 2017-08-14 DIAGNOSIS — Z4659 Encounter for fitting and adjustment of other gastrointestinal appliance and device: Secondary | ICD-10-CM

## 2017-08-14 DIAGNOSIS — R69 Illness, unspecified: Secondary | ICD-10-CM | POA: Diagnosis not present

## 2017-08-14 DIAGNOSIS — J9690 Respiratory failure, unspecified, unspecified whether with hypoxia or hypercapnia: Secondary | ICD-10-CM

## 2017-08-14 DIAGNOSIS — R0600 Dyspnea, unspecified: Secondary | ICD-10-CM | POA: Diagnosis not present

## 2017-08-14 DIAGNOSIS — E872 Acidosis: Secondary | ICD-10-CM | POA: Diagnosis present

## 2017-08-14 LAB — I-STAT CG4 LACTIC ACID, ED
Lactic Acid, Venous: 2.62 mmol/L (ref 0.5–1.9)
Lactic Acid, Venous: 4.77 mmol/L (ref 0.5–1.9)

## 2017-08-14 LAB — CBC WITH DIFFERENTIAL/PLATELET
Basophils Absolute: 0 10*3/uL (ref 0.0–0.1)
Basophils Absolute: 0 10*3/uL (ref 0.0–0.1)
Basophils Relative: 0 %
Basophils Relative: 0 %
EOS ABS: 0 10*3/uL (ref 0.0–0.7)
Eosinophils Absolute: 0 10*3/uL (ref 0.0–0.7)
Eosinophils Relative: 0 %
Eosinophils Relative: 0 %
HCT: 35.4 % — ABNORMAL LOW (ref 39.0–52.0)
HEMATOCRIT: 42.9 % (ref 39.0–52.0)
HEMOGLOBIN: 14.6 g/dL (ref 13.0–17.0)
HEMOGLOBIN: 11.9 g/dL — AB (ref 13.0–17.0)
LYMPHS ABS: 3.3 10*3/uL (ref 0.7–4.0)
LYMPHS ABS: 2.5 10*3/uL (ref 0.7–4.0)
LYMPHS PCT: 66 %
LYMPHS PCT: 47 %
MCH: 30.5 pg (ref 26.0–34.0)
MCH: 29.8 pg (ref 26.0–34.0)
MCHC: 34 g/dL (ref 30.0–36.0)
MCHC: 33.6 g/dL (ref 30.0–36.0)
MCV: 89.7 fL (ref 78.0–100.0)
MCV: 88.5 fL (ref 78.0–100.0)
MONOS PCT: 10 %
Monocytes Absolute: 0.5 10*3/uL (ref 0.1–1.0)
Monocytes Absolute: 1.2 10*3/uL — ABNORMAL HIGH (ref 0.1–1.0)
Monocytes Relative: 22 %
NEUTROS ABS: 1.2 10*3/uL — AB (ref 1.7–7.7)
NEUTROS PCT: 24 %
NEUTROS PCT: 31 %
Neutro Abs: 1.7 10*3/uL (ref 1.7–7.7)
Platelets: 119 10*3/uL — ABNORMAL LOW (ref 150–400)
Platelets: 97 10*3/uL — ABNORMAL LOW (ref 150–400)
RBC: 4.78 MIL/uL (ref 4.22–5.81)
RBC: 4 MIL/uL — AB (ref 4.22–5.81)
RDW: 16.1 % — ABNORMAL HIGH (ref 11.5–15.5)
RDW: 15.9 % — ABNORMAL HIGH (ref 11.5–15.5)
WBC: 5 10*3/uL (ref 4.0–10.5)
WBC: 5.4 10*3/uL (ref 4.0–10.5)

## 2017-08-14 LAB — COMPREHENSIVE METABOLIC PANEL
ALBUMIN: 3.7 g/dL (ref 3.5–5.0)
ALT: 42 U/L (ref 17–63)
AST: 63 U/L — AB (ref 15–41)
Alkaline Phosphatase: 81 U/L (ref 38–126)
Anion gap: 14 (ref 5–15)
BILIRUBIN TOTAL: 1.5 mg/dL — AB (ref 0.3–1.2)
BUN: 11 mg/dL (ref 6–20)
CO2: 22 mmol/L (ref 22–32)
Calcium: 8.9 mg/dL (ref 8.9–10.3)
Chloride: 96 mmol/L — ABNORMAL LOW (ref 101–111)
Creatinine, Ser: 0.82 mg/dL (ref 0.61–1.24)
GFR calc Af Amer: >60 mL/min (ref >60)
GFR calc non Af Amer: >60 mL/min (ref >60)
GLUCOSE: 154 mg/dL — AB (ref 65–99)
POTASSIUM: 2.8 mmol/L — AB (ref 3.5–5.1)
SODIUM: 132 mmol/L — AB (ref 135–145)
TOTAL PROTEIN: 7.3 g/dL (ref 6.5–8.1)

## 2017-08-14 LAB — CSF CELL COUNT WITH DIFFERENTIAL
RBC COUNT CSF: 8500 /mm3 — AB
RBC Count, CSF: 23000 /mm3 — ABNORMAL HIGH
TUBE #: 1
Tube #: 4
WBC CSF: 1 /mm3 (ref 0–5)
WBC, CSF: 1 /mm3 (ref 0–5)

## 2017-08-14 LAB — TROPONIN I
TROPONIN I: 0.67 ng/mL — AB (ref <0.03)
TROPONIN I: 0.96 ng/mL — AB (ref <0.03)

## 2017-08-14 LAB — MRSA PCR SCREENING: MRSA BY PCR: NEGATIVE

## 2017-08-14 LAB — BASIC METABOLIC PANEL
Anion gap: 11 (ref 5–15)
Anion gap: 14 (ref 5–15)
BUN: 14 mg/dL (ref 6–20)
BUN: 16 mg/dL (ref 6–20)
CALCIUM: 7 mg/dL — AB (ref 8.9–10.3)
CHLORIDE: 107 mmol/L (ref 101–111)
CO2: 18 mmol/L — AB (ref 22–32)
CO2: 11 mmol/L — AB (ref 22–32)
CREATININE: 1.17 mg/dL (ref 0.61–1.24)
Calcium: 7.5 mg/dL — ABNORMAL LOW (ref 8.9–10.3)
Chloride: 110 mmol/L (ref 101–111)
Creatinine, Ser: 1.33 mg/dL — ABNORMAL HIGH (ref 0.61–1.24)
GFR calc Af Amer: >60 mL/min (ref >60)
GFR calc non Af Amer: 55 mL/min — ABNORMAL LOW (ref >60)
GFR calc non Af Amer: >60 mL/min (ref >60)
GLUCOSE: 171 mg/dL — AB (ref 65–99)
Glucose, Bld: 116 mg/dL — ABNORMAL HIGH (ref 65–99)
POTASSIUM: 2.2 mmol/L — AB (ref 3.5–5.1)
Potassium: 3.7 mmol/L (ref 3.5–5.1)
SODIUM: 136 mmol/L (ref 135–145)
Sodium: 135 mmol/L (ref 135–145)

## 2017-08-14 LAB — CBC
HCT: 39.2 % (ref 39.0–52.0)
Hemoglobin: 13.1 g/dL (ref 13.0–17.0)
MCH: 30.2 pg (ref 26.0–34.0)
MCHC: 33.4 g/dL (ref 30.0–36.0)
MCV: 90.3 fL (ref 78.0–100.0)
PLATELETS: 96 10*3/uL — AB (ref 150–400)
RBC: 4.34 MIL/uL (ref 4.22–5.81)
RDW: 16.2 % — ABNORMAL HIGH (ref 11.5–15.5)
WBC: 8.2 10*3/uL (ref 4.0–10.5)

## 2017-08-14 LAB — GLUCOSE, CSF: Glucose, CSF: 82 mg/dL — ABNORMAL HIGH (ref 40–70)

## 2017-08-14 LAB — URINALYSIS, ROUTINE W REFLEX MICROSCOPIC
BACTERIA UA: NONE SEEN
BILIRUBIN URINE: NEGATIVE
Glucose, UA: NEGATIVE mg/dL
HGB URINE DIPSTICK: NEGATIVE
KETONES UR: NEGATIVE mg/dL
LEUKOCYTES UA: NEGATIVE
NITRITE: NEGATIVE
Protein, ur: NEGATIVE mg/dL
RBC / HPF: NONE SEEN RBC/hpf (ref 0–5)
SQUAMOUS EPITHELIAL / LPF: NONE SEEN
Specific Gravity, Urine: 1.01 (ref 1.005–1.030)
pH: 5 (ref 5.0–8.0)

## 2017-08-14 LAB — GLUCOSE, CAPILLARY: GLUCOSE-CAPILLARY: 100 mg/dL — AB (ref 65–99)

## 2017-08-14 LAB — POCT I-STAT 3, ART BLOOD GAS (G3+)
ACID-BASE DEFICIT: 12 mmol/L — AB (ref 0.0–2.0)
BICARBONATE: 16.3 mmol/L — AB (ref 20.0–28.0)
O2 SAT: 100 %
PO2 ART: 269 mmHg — AB (ref 83.0–108.0)
TCO2: 18 mmol/L — AB (ref 22–32)
pCO2 arterial: 48.1 mmHg — ABNORMAL HIGH (ref 32.0–48.0)
pH, Arterial: 7.144 — CL (ref 7.350–7.450)

## 2017-08-14 LAB — COOXEMETRY PANEL
Carboxyhemoglobin: 1.9 % — ABNORMAL HIGH (ref 0.5–1.5)
METHEMOGLOBIN: 0.8 % (ref 0.0–1.5)
O2 Saturation: 84.3 %
TOTAL HEMOGLOBIN: 12.1 g/dL (ref 12.0–16.0)

## 2017-08-14 LAB — STREP PNEUMONIAE URINARY ANTIGEN: Strep Pneumo Urinary Antigen: NEGATIVE

## 2017-08-14 LAB — PROCALCITONIN: Procalcitonin: 22.2 ng/mL

## 2017-08-14 LAB — TRIGLYCERIDES: TRIGLYCERIDES: 113 mg/dL (ref <150)

## 2017-08-14 LAB — PROTIME-INR
INR: 1.13
Prothrombin Time: 14.4 seconds (ref 11.4–15.2)

## 2017-08-14 LAB — INFLUENZA PANEL BY PCR (TYPE A & B)
INFLAPCR: NEGATIVE
INFLBPCR: NEGATIVE

## 2017-08-14 LAB — APTT: aPTT: 29 seconds (ref 24–36)

## 2017-08-14 LAB — C DIFFICILE QUICK SCREEN W PCR REFLEX
C DIFFICILE (CDIFF) INTERP: NOT DETECTED
C Diff antigen: NEGATIVE
C Diff toxin: NEGATIVE

## 2017-08-14 LAB — PROTEIN, CSF: Total  Protein, CSF: 49 mg/dL — ABNORMAL HIGH (ref 15–45)

## 2017-08-14 LAB — LACTIC ACID, PLASMA
LACTIC ACID, VENOUS: 4.3 mmol/L — AB (ref 0.5–1.9)
Lactic Acid, Venous: 3 mmol/L (ref 0.5–1.9)

## 2017-08-14 MED ORDER — DEXTROSE 5 % IV SOLN
500.0000 mg | INTRAVENOUS | Status: DC
  Administered 2017-08-14: 500 mg via INTRAVENOUS
  Filled 2017-08-14: qty 500

## 2017-08-14 MED ORDER — CEFTRIAXONE SODIUM 1 G IJ SOLR
1.0000 g | Freq: Once | INTRAMUSCULAR | Status: AC
  Administered 2017-08-14: 1 g via INTRAVENOUS
  Filled 2017-08-14: qty 10

## 2017-08-14 MED ORDER — ACETAMINOPHEN 650 MG RE SUPP
650.0000 mg | Freq: Once | RECTAL | Status: AC
  Administered 2017-08-14: 650 mg via RECTAL

## 2017-08-14 MED ORDER — SODIUM CHLORIDE 0.9 % IV BOLUS (SEPSIS)
1000.0000 mL | Freq: Once | INTRAVENOUS | Status: AC
  Administered 2017-08-14: 1000 mL via INTRAVENOUS

## 2017-08-14 MED ORDER — VASOPRESSIN 20 UNIT/ML IV SOLN
0.0300 [IU]/min | INTRAVENOUS | Status: DC
  Administered 2017-08-14: 0.03 [IU]/min via INTRAVENOUS
  Filled 2017-08-14 (×2): qty 2

## 2017-08-14 MED ORDER — PROPOFOL 1000 MG/100ML IV EMUL
0.0000 ug/kg/min | INTRAVENOUS | Status: DC
  Administered 2017-08-14: 20 ug/kg/min via INTRAVENOUS
  Administered 2017-08-15 (×2): 15 ug/kg/min via INTRAVENOUS
  Filled 2017-08-14 (×2): qty 100

## 2017-08-14 MED ORDER — DEXTROSE 5 % IV SOLN
1000.0000 mg | Freq: Three times a day (TID) | INTRAVENOUS | Status: DC
  Administered 2017-08-14 – 2017-08-15 (×4): 1000 mg via INTRAVENOUS
  Filled 2017-08-14 (×6): qty 20

## 2017-08-14 MED ORDER — POTASSIUM CHLORIDE 10 MEQ/100ML IV SOLN
10.0000 meq | INTRAVENOUS | Status: AC
  Administered 2017-08-14 (×4): 10 meq via INTRAVENOUS
  Filled 2017-08-14 (×4): qty 100

## 2017-08-14 MED ORDER — SODIUM CHLORIDE 0.9 % IV BOLUS (SEPSIS)
1000.0000 mL | Freq: Once | INTRAVENOUS | Status: AC
Start: 2017-08-14 — End: 2017-08-14
  Administered 2017-08-14: 1000 mL via INTRAVENOUS

## 2017-08-14 MED ORDER — SODIUM CHLORIDE 0.9 % IV SOLN
0.0000 ug/min | INTRAVENOUS | Status: DC
  Administered 2017-08-14: 300 ug/min via INTRAVENOUS
  Administered 2017-08-14: 400 ug/min via INTRAVENOUS
  Filled 2017-08-14 (×2): qty 4

## 2017-08-14 MED ORDER — FENTANYL CITRATE (PF) 100 MCG/2ML IJ SOLN
50.0000 ug | Freq: Once | INTRAMUSCULAR | Status: AC
  Administered 2017-08-14: 50 ug via INTRAVENOUS

## 2017-08-14 MED ORDER — MIDAZOLAM HCL 2 MG/2ML IJ SOLN
2.0000 mg | Freq: Once | INTRAMUSCULAR | Status: AC
  Administered 2017-08-14: 2 mg via INTRAVENOUS

## 2017-08-14 MED ORDER — HEPARIN SODIUM (PORCINE) 5000 UNIT/ML IJ SOLN
5000.0000 [IU] | Freq: Three times a day (TID) | INTRAMUSCULAR | Status: DC
  Administered 2017-08-14 – 2017-08-16 (×7): 5000 [IU] via SUBCUTANEOUS
  Filled 2017-08-14 (×7): qty 1

## 2017-08-14 MED ORDER — SODIUM CHLORIDE 0.9 % IV BOLUS (SEPSIS)
500.0000 mL | Freq: Once | INTRAVENOUS | Status: AC
  Administered 2017-08-14: 500 mL via INTRAVENOUS

## 2017-08-14 MED ORDER — SENNOSIDES 8.8 MG/5ML PO SYRP
5.0000 mL | ORAL_SOLUTION | Freq: Two times a day (BID) | ORAL | Status: DC | PRN
  Filled 2017-08-14: qty 5

## 2017-08-14 MED ORDER — CHLORHEXIDINE GLUCONATE 0.12% ORAL RINSE (MEDLINE KIT)
15.0000 mL | Freq: Two times a day (BID) | OROMUCOSAL | Status: DC
  Administered 2017-08-14 – 2017-08-16 (×4): 15 mL via OROMUCOSAL

## 2017-08-14 MED ORDER — POTASSIUM CHLORIDE 10 MEQ/100ML IV SOLN
10.0000 meq | INTRAVENOUS | Status: AC
  Administered 2017-08-14 (×3): 10 meq via INTRAVENOUS
  Filled 2017-08-14 (×3): qty 100

## 2017-08-14 MED ORDER — PANTOPRAZOLE SODIUM 40 MG IV SOLR
40.0000 mg | Freq: Every day | INTRAVENOUS | Status: DC
  Administered 2017-08-14 – 2017-08-16 (×3): 40 mg via INTRAVENOUS
  Filled 2017-08-14 (×4): qty 40

## 2017-08-14 MED ORDER — ROCURONIUM BROMIDE 50 MG/5ML IV SOLN
80.0000 mg | Freq: Once | INTRAVENOUS | Status: AC
  Administered 2017-08-14: 80 mg via INTRAVENOUS

## 2017-08-14 MED ORDER — NOREPINEPHRINE BITARTRATE 1 MG/ML IV SOLN
0.0000 ug/min | INTRAVENOUS | Status: DC
  Administered 2017-08-14: 4 ug/min via INTRAVENOUS
  Administered 2017-08-15: 22 ug/min via INTRAVENOUS
  Filled 2017-08-14 (×3): qty 16

## 2017-08-14 MED ORDER — PROPOFOL 1000 MG/100ML IV EMUL
INTRAVENOUS | Status: AC
  Administered 2017-08-14: 20 ug/kg/min via INTRAVENOUS
  Filled 2017-08-14: qty 100

## 2017-08-14 MED ORDER — ETOMIDATE 2 MG/ML IV SOLN
20.0000 mg | Freq: Once | INTRAVENOUS | Status: AC
  Administered 2017-08-14: 20 mg via INTRAVENOUS

## 2017-08-14 MED ORDER — FENTANYL CITRATE (PF) 100 MCG/2ML IJ SOLN
100.0000 ug | Freq: Once | INTRAMUSCULAR | Status: AC
  Administered 2017-08-14: 100 ug via INTRAVENOUS

## 2017-08-14 MED ORDER — FENTANYL 2500MCG IN NS 250ML (10MCG/ML) PREMIX INFUSION
25.0000 ug/h | INTRAVENOUS | Status: DC
  Administered 2017-08-14: 100 ug/h via INTRAVENOUS
  Administered 2017-08-15: 200 ug/h via INTRAVENOUS
  Administered 2017-08-16: 150 ug/h via INTRAVENOUS
  Filled 2017-08-14 (×3): qty 250

## 2017-08-14 MED ORDER — ORAL CARE MOUTH RINSE
15.0000 mL | Freq: Two times a day (BID) | OROMUCOSAL | Status: DC
  Administered 2017-08-14 (×2): 15 mL via OROMUCOSAL

## 2017-08-14 MED ORDER — ORAL CARE MOUTH RINSE
15.0000 mL | Freq: Four times a day (QID) | OROMUCOSAL | Status: DC
  Administered 2017-08-15 – 2017-08-16 (×7): 15 mL via OROMUCOSAL

## 2017-08-14 MED ORDER — MIDAZOLAM HCL 2 MG/2ML IJ SOLN: 1.0000 mg | INTRAMUSCULAR | Status: DC | PRN

## 2017-08-14 MED ORDER — AZITHROMYCIN 500 MG IV SOLR
500.0000 mg | Freq: Once | INTRAVENOUS | Status: AC
  Administered 2017-08-14: 500 mg via INTRAVENOUS
  Filled 2017-08-14: qty 500

## 2017-08-14 MED ORDER — SODIUM BICARBONATE 8.4 % IV SOLN
INTRAVENOUS | Status: DC
  Administered 2017-08-14 – 2017-08-15 (×4): via INTRAVENOUS
  Filled 2017-08-14 (×6): qty 150

## 2017-08-14 MED ORDER — ACETAMINOPHEN 650 MG RE SUPP
RECTAL | Status: AC
  Administered 2017-08-14: 650 mg via RECTAL
  Filled 2017-08-14: qty 1

## 2017-08-14 MED ORDER — DEXTROSE 5 % IV SOLN
2.0000 g | Freq: Three times a day (TID) | INTRAVENOUS | Status: AC
  Administered 2017-08-14 – 2017-08-21 (×24): 2 g via INTRAVENOUS
  Filled 2017-08-14 (×25): qty 2

## 2017-08-14 MED ORDER — FENTANYL CITRATE (PF) 100 MCG/2ML IJ SOLN
INTRAMUSCULAR | Status: AC
  Administered 2017-08-14: 100 ug via INTRAVENOUS
  Filled 2017-08-14: qty 2

## 2017-08-14 MED ORDER — SODIUM CHLORIDE 0.9 % IV SOLN
0.0000 ug/min | INTRAVENOUS | Status: DC
  Administered 2017-08-14: 100 ug/min via INTRAVENOUS
  Administered 2017-08-14: 400 ug/min via INTRAVENOUS
  Filled 2017-08-14: qty 1
  Filled 2017-08-14: qty 10
  Filled 2017-08-14: qty 1
  Filled 2017-08-14: qty 10

## 2017-08-14 MED ORDER — HYDROCORTISONE NA SUCCINATE PF 100 MG IJ SOLR
50.0000 mg | Freq: Four times a day (QID) | INTRAMUSCULAR | Status: DC
Start: 2017-08-14 — End: 2017-08-18
  Administered 2017-08-14 – 2017-08-18 (×17): 50 mg via INTRAVENOUS
  Filled 2017-08-14 (×3): qty 1
  Filled 2017-08-14: qty 2
  Filled 2017-08-14 (×2): qty 1
  Filled 2017-08-14: qty 2
  Filled 2017-08-14 (×9): qty 1
  Filled 2017-08-14: qty 2
  Filled 2017-08-14 (×3): qty 1
  Filled 2017-08-14: qty 2

## 2017-08-14 MED ORDER — IPRATROPIUM-ALBUTEROL 0.5-2.5 (3) MG/3ML IN SOLN
3.0000 mL | Freq: Four times a day (QID) | RESPIRATORY_TRACT | Status: DC
  Administered 2017-08-14 – 2017-08-20 (×23): 3 mL via RESPIRATORY_TRACT
  Filled 2017-08-14 (×24): qty 3

## 2017-08-14 MED ORDER — CHLORHEXIDINE GLUCONATE 0.12 % MT SOLN
15.0000 mL | Freq: Two times a day (BID) | OROMUCOSAL | Status: DC
  Administered 2017-08-14: 15 mL via OROMUCOSAL

## 2017-08-14 MED ORDER — MIDAZOLAM HCL 2 MG/2ML IJ SOLN
1.0000 mg | INTRAMUSCULAR | Status: DC | PRN
  Administered 2017-08-14 – 2017-08-15 (×2): 1 mg via INTRAVENOUS
  Filled 2017-08-14 (×2): qty 2

## 2017-08-14 MED ORDER — DEXTROSE 5 % IV SOLN
1.0000 g | Freq: Once | INTRAVENOUS | Status: AC
  Administered 2017-08-14: 1 g via INTRAVENOUS
  Filled 2017-08-14: qty 10

## 2017-08-14 MED ORDER — SODIUM CHLORIDE 0.9 % IV SOLN
2.0000 g | Freq: Once | INTRAVENOUS | Status: AC
  Administered 2017-08-14: 2 g via INTRAVENOUS
  Filled 2017-08-14: qty 2000

## 2017-08-14 MED ORDER — ACETAMINOPHEN 650 MG RE SUPP
650.0000 mg | Freq: Once | RECTAL | Status: AC
  Administered 2017-08-14: 650 mg via RECTAL
  Filled 2017-08-14: qty 1

## 2017-08-14 MED ORDER — FENTANYL BOLUS VIA INFUSION
25.0000 ug | INTRAVENOUS | Status: DC | PRN
  Administered 2017-08-14: 25 ug via INTRAVENOUS
  Filled 2017-08-14: qty 25

## 2017-08-14 MED ORDER — VANCOMYCIN HCL IN DEXTROSE 1-5 GM/200ML-% IV SOLN
1000.0000 mg | Freq: Once | INTRAVENOUS | Status: DC
  Filled 2017-08-14: qty 200

## 2017-08-14 MED ORDER — VANCOMYCIN HCL 10 G IV SOLR
1250.0000 mg | Freq: Two times a day (BID) | INTRAVENOUS | Status: DC
  Administered 2017-08-14 – 2017-08-17 (×7): 1250 mg via INTRAVENOUS
  Filled 2017-08-14 (×8): qty 1250

## 2017-08-14 MED ORDER — DEXTROSE 5 % IV SOLN: 2.0000 g | Freq: Two times a day (BID) | INTRAVENOUS | Status: DC

## 2017-08-14 MED ORDER — LIDOCAINE HCL (PF) 1 % IJ SOLN
INTRAMUSCULAR | Status: AC
  Filled 2017-08-14: qty 30

## 2017-08-14 MED ORDER — MIDAZOLAM HCL 2 MG/2ML IJ SOLN
INTRAMUSCULAR | Status: AC
  Administered 2017-08-14: 2 mg via INTRAVENOUS
  Filled 2017-08-14: qty 2

## 2017-08-14 MED ORDER — SODIUM CHLORIDE 0.9 % IV SOLN: INTRAVENOUS | Status: DC | PRN

## 2017-08-14 MED ORDER — IOPAMIDOL (ISOVUE-300) INJECTION 61%
INTRAVENOUS | Status: AC
  Administered 2017-08-14: 17:00:00
  Filled 2017-08-14: qty 30

## 2017-08-14 MED ORDER — SODIUM CHLORIDE 0.9 % IV SOLN
2.0000 g | INTRAVENOUS | Status: DC
  Administered 2017-08-14 – 2017-08-15 (×7): 2 g via INTRAVENOUS
  Filled 2017-08-14 (×10): qty 2000

## 2017-08-14 NOTE — ED Notes (Signed)
LSN [id number] 08/13/17

## 2017-08-14 NOTE — ED Notes (Signed)
IV team at bedside 

## 2017-08-14 NOTE — ED Notes (Signed)
Sent label to main lab for PTT blood test.

## 2017-08-14 NOTE — Consult Note (Signed)
PULMONARY / CRITICAL CARE MEDICINE   Name: Joseph [name] MRN: [id number] DOB: 06-15-1951    ADMISSION DATE:  08/14/2017 CONSULTATION DATE:  08/14/2017  REFERRING MD: Dr. Bebe Shaggy  CHIEF COMPLAINT: Fever  HISTORY OF PRESENT ILLNESS:   66 year old male with past medical history as below, which is significant for rheumatoid arthritis (on daily prednisone and methotrexate), COPD and bronchiectasis on 3 L oxygen, diabetes, GERD, recurrent pneumonia secondary to chronic aspiration/reflux, and coronary artery disease.  He has a several day history of what he considered to be a sinus infection that he picked up from his wife.  He presented to pulmonary clinic for this and was written a prescription for Augmentin.  His symptoms progressed and he developed high fevers, headache, malaise.  He presented to the emergency department on 12/20 with these complaints.  Symptoms were felt to be infectious in origin and potentially meningitis.  He had LP done in the emergency department and was started on broad-spectrum antibiotic.  He was hypotensive despite IV fluid bolus and PCCM was asked to admit.  PAST MEDICAL HISTORY :  He  has a past medical history of CAD (coronary artery disease), Cataracts, bilateral, Complication of anesthesia, COPD (chronic obstructive pulmonary disease) (HCC), Diabetes mellitus, Esophageal dysmotility, Fatty liver (10/11/09), Gastroesophageal reflux disease, Gastroparesis, Hiatal hernia, Hypertension, Neuropathy, Onychomycosis, Osteoporosis, Oxygen dependent, Peripheral polyneuropathy, Psoriasis, and Rheumatoid arthritis (HCC).  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  No Known Allergies  No current facility-administered medications on file prior to encounter.    Current Outpatient Medications on File Prior to Encounter  Medication Sig  . albuterol (PROAIR HFA) 108 (90 Base) MCG/ACT inhaler Inhale 2 puffs into the lungs every 4 (four) hours as needed for wheezing or shortness of breath.  Marland Kitchen amitriptyline (ELAVIL) 10 MG tablet Take 10 mg by mouth at bedtime.  Marland Kitchen amoxicillin-clavulanate (AUGMENTIN) 875-125 MG tablet Take 1 tablet by mouth 2 (two) times daily.  . Ascorbic Acid (VITAMIN C) 500 MG tablet Take 500 mg by mouth daily.    Marland Kitchen aspirin EC 81 MG tablet Take 81 mg by mouth daily.  Marland Kitchen atorvastatin (LIPITOR) 80 MG tablet TAKE 1 TABLET(80 MG) BY MOUTH DAILY  . B Complex-C (SUPER B COMPLEX PO) Take by mouth daily.  . B-D TB SYRINGE 1CC/27GX1/2" 27G X 1/2" 1 ML MISC Reported on 02/16/2016  . Carbonyl Iron (PERFECT IRON PO) Take 1 tablet by mouth every morning.   . clobetasol cream (TEMOVATE) 0.05 % Apply 1 application topically 2 (two) times daily.  . Fluticasone-Umeclidin-Vilant (TRELEGY ELLIPTA) 100-62.5-25 MCG/INH AEPB Inhale 1 puff daily into the lungs.  . folic acid (FOLVITE) 800 MCG tablet Take 800 mcg by mouth daily.  . furosemide (LASIX) 20 MG tablet TAKE 2 TABLETS BY MOUTH EVERY MORNING AND 1 TABLET BY MOUTH AT 4 PM  . hydrocortisone cream 1 % Apply 1 application topically 3 (three) times daily as needed for itching (skin irritation).  . Lancets (ONETOUCH ULTRASOFT) lancets USE TO TEST BLOOD SUGAR NO MORE THAN TWICE DAILY  . Loteprednol Etabonate (LOTEMAX) 0.5 % GEL Place 1 drop into both eyes every morning. daily  . methotrexate 50 MG/2ML injection Inject 7 mg into the muscle once a week.   Marland Kitchen  metoprolol tartrate (LOPRESSOR) 25 MG tablet TAKE 1 TABLET BY MOUTH TWICE A DAY  . Misc. Devices (ACAPELLA) MISC Use as directed  . nitroGLYCERIN (NITROSTAT)  0.4 MG SL tablet Place 1 tablet (0.4 mg total) under the tongue every 5 (five) minutes as needed for chest pain (up to 3 doses for severe chest pain. If no relief after 3rd dose, proceed to the ED for an evaluation).  . NON FORMULARY [place]ER[place] PORTABLE PULSE DOSE OXYGEN oxygen 3L per nasal cannula 24 hours a day Dx: 496  . ONE TOUCH ULTRA TEST test strip USE TO TEST BLOOD SUGAR NO MORE THAN TWICE DAILY  . oxyCODONE-acetaminophen (PERCOCET) 7.5-325 MG per tablet Take 1 tablet by mouth 3 (three) times daily. 3 times a day  . pantoprazole (PROTONIX) 40 MG tablet Take 1 tablet (40 mg total) by mouth 2 (two) times daily.  . potassium chloride (K-DUR) 10 MEQ tablet TAKE 4 TABLETS BY MOUTH EVERY MORNING AND TAKE 2 TABLETS BY MOUTH EVERY EVENING  . predniSONE (DELTASONE) 5 MG tablet Take by mouth daily. PT Take as directed  . pregabalin (LYRICA) 75 MG capsule Take 75 mg by mouth 2 (two) times daily.  Marland Kitchen Respiratory Therapy Supplies (FLUTTER) DEVI 1 Inhaler by Does not apply route 2 (two) times daily.  . vitamin B-12 (CYANOCOBALAMIN) 250 MCG tablet Take 250 mcg by mouth daily.    . [DISCONTINUED] omeprazole (PRILOSEC OTC) 20 MG tablet Take 1 tablet (20 mg total) by mouth daily.    FAMILY HISTORY:  His indicated that his mother is deceased. He indicated that his father is deceased. He indicated that his maternal grandmother is deceased. He indicated that his maternal grandfather is deceased. He indicated that his paternal grandmother is deceased. He indicated that his paternal grandfather is deceased. He indicated that the status of his neg hx is unknown.   SOCIAL HISTORY: He  reports that he quit smoking about 11 years ago. He has a 40.00 pack-year smoking history. He quit smokeless tobacco use about 11 years ago. He reports that he does not drink alcohol or use drugs.  REVIEW OF SYSTEMS:   Unable due to encephalopathy  SUBJECTIVE:   VITAL SIGNS: BP (!) 89/65   Pulse (!) 131   Temp (!) 104.1 F  (40.1 C) (Axillary)   Resp (!) 27   Ht 6' (1.829 m)   Wt 97.5 kg (215 lb)   SpO2 98%   BMI 29.16 kg/m   HEMODYNAMICS:    VENTILATOR SETTINGS:    INTAKE / OUTPUT: No intake/output data recorded.  PHYSICAL EXAMINATION: General:  Elderly male resting in bed in some distress Neuro:  Tremor. Arouses to verbal, no verbal response.  HEENT:  [place]/AT, PERRL, no JVD. Neck supple Cardiovascular:  RRR, no MRG Lungs:  Clear Abdomen:  Soft, non-tender, non-distended Musculoskeletal:  No acute deformity, chronic mild contractures of bilateral lower extremities.  Skin:  Grossly intact  LABS:  BMET Recent Labs  Lab 08/14/17 0121  NA 132*  K 2.8*  CL 96*  CO2 22  BUN 11  CREATININE 0.82  GLUCOSE 154*    Electrolytes Recent Labs  Lab 08/14/17 0121  CALCIUM 8.9    CBC Recent Labs  Lab 08/14/17 0121  WBC 5.0  HGB 14.6  HCT 42.9  PLT 119*    Coag's Recent Labs  Lab 08/14/17 0121 08/14/17 0152  APTT  --  29  INR 1.13  --     Sepsis Markers Recent Labs  Lab 08/14/17  0137 08/14/17 0340  LATICACIDVEN 2.62* 4.77*    ABG No results for input(s): PHART, PCO2ART, PO2ART in the last 168 hours.  Liver Enzymes Recent Labs  Lab 08/14/17 0121  AST 63*  ALT 42  ALKPHOS 81  BILITOT 1.5*  ALBUMIN 3.7    Cardiac Enzymes No results for input(s): TROPONINI, PROBNP in the last 168 hours.  Glucose No results for input(s): GLUCAP in the last 168 hours.  Imaging Ct Head Wo Contrast  Result Date: 08/14/2017 CLINICAL DATA:  Altered mental status and fever EXAM: CT HEAD WITHOUT CONTRAST TECHNIQUE: Contiguous axial images were obtained from the base of the skull through the vertex without intravenous contrast. COMPARISON:  Head CT April 25, 2006; brain MRI April 27, 2006 FINDINGS: Brain: The ventricles are normal in size and configuration. There is no appreciable intracranial mass, hemorrhage, extra-axial fluid collection, or midline shift. There is an age  uncertain infarct in the anterior limb of the left internal capsule. Elsewhere gray-white compartments appear normal. Vascular: No hyperdense vessel. There is calcification in each carotid siphon region. Skull: Bony calvarium appears intact. Sinuses/Orbits: There is extensive opacification of most of the right frontal sinus. There is opacification of multiple ethmoid air cells bilaterally. There is opacification of much of the right maxillary antrum with an air-fluid level on the right. There is moderate mucosal thickening in the left maxillary antrum. There is air-fluid level in the right sphenoid sinus. Orbits appear symmetric bilaterally. Other: Mastoid air cells are clear. IMPRESSION: 1. Age uncertain and possibly recent infarct involving the anterior limb of the left internal capsule. Elsewhere gray-white compartments appear normal. No mass or hemorrhage. 2. Multifocal paranasal sinus disease with air-fluid levels in the right sphenoid and right maxillary sinuses. 3.  Foci of arterial vascular calcification noted. Electronically Signed   By: Bretta Bang III M.D.   On: 08/14/2017 02:53   Dg Chest Port 1 View  Result Date: 08/14/2017 CLINICAL DATA:  Altered mental status. EXAM: PORTABLE CHEST 1 VIEW COMPARISON:  Most recent radiographs 02/10/2017. Most recent CT 02/23/2016 FINDINGS: Post median sternotomy and CABG. Mild cardiomegaly. Unchanged mediastinal contours. Emphysema. Bleb in the left upper lobe. Scarring in the periphery of the right upper lobe. No consolidation, pleural fluid or pneumothorax. No pulmonary edema. IMPRESSION: 1. No acute abnormality. 2. Emphysema with multifocal scarring. 3. Stable mild cardiomegaly. Electronically Signed   By: Rubye Oaks M.D.   On: 08/14/2017 01:48     STUDIES:  CT head 12/20 > Age uncertain and possibly recent infarct involving the anterior limb of the left internal capsule. Elsewhere gray-white compartments appear normal. No mass or hemorrhage.  Multifocal paranasal sinus disease with air-fluid levels in the right sphenoid and right maxillary sinuses.  CULTURES: Blood 12/20 > Urine 12/20 > CSF 12/20 > Flu PCR 12/20 > NEG  ANTIBIOTICS: Augmentin 12/17 >12/20 Ampicillin 12/20 > Vancomycin 12/20 >  Cefepime 12/20 > Azithromycin 12/20 > Acyclovir 12/20 >  SIGNIFICANT EVENTS: 12/20 admit  LINES/TUBES:   DISCUSSION: 66 year old male with COPD and RA on pred and methotrexate presenting with headache, weakness, and high fevers. Unclear origin. Sinusitis on CT. LP results pending. Covering meningitis. At risk intubation.    ASSESSMENT / PLAN:  PULMONARY A: Tachypnea compensation for lactic acidosis At risk airway compromise Chronic hypoxemic respiratory failure (3L at baseline) COPD without acute exacerbation. Bronchiectasis  P:   At risk for intubation, monitor closely. Scheduled nebs  CARDIOVASCULAR A:  Septic shock Atrial fibrillation with RVR (with history  of flutter s/p ablation)  P:  ICU hemodynamic montoring S/p 3 Liters IVF resuscitation. Would give at least 1 more if BP doesn't hold. Continue to trend lactic until clearing Control fever to hopefull slow HR, if not consider amio.   RENAL A:   Hypokalemia Hyponatremia  P:   Supp K Repeat BMP  GASTROINTESTINAL A:   Diarrhea   P:   NPO C diff eval  HEMATOLOGIC A:   Mild thrombocytopenia  P:  SQ heparin Follow CBC  INFECTIOUS A:   Hyperpyrexia of unknown origin: Concern for meningitis with headache, sinusitis, and high fevers. R/o CAP as well.   Immunocompromised host  P:   Follow cultures LP results pending ABX as above (amp, cefepime, vanco, azithro) want to cover pseudomonas due to bronchiectasis.  Tylenol PRN for fevers Add acyclovir   AUTOIMMUNE A:   RA - [facility] at [facility]  P:   Holding methotrexate until able to take PO Holding prednisone in favor of stress dose hydrocort.  ENDOCRINE A:   Relative AI  P:    Stress dose steroids.   NEUROLOGIC A:   Acute metabolic encephalopathy  P:   Monitor closely. Hopefull should improve with fever.    FAMILY  - Updates: Wife updated  - Inter-disciplinary family meet or Palliative Care meeting due by:  12/27    Joneen Roach, AGACNP-BC [facility] Pager [phone] or [phone]  08/14/2017 5:43 AM

## 2017-08-14 NOTE — Progress Notes (Signed)
Responded to page to support patient and family. Wife at bedside.  Patient is being intubated.  Per patient wife her son is in route back to hospital. I met pt's  wife early morning prior to this visit  In [place] and provided spiritual and emotional support.  This was a follow up for continued support. Prayed with patient and wife at bedside,provided listening and ministry of presence.  Chaplain available as needed.   08/14/17 1521  Clinical Encounter Type  Visited With Family;Patient and family together;Health care provider  Visit Type Initial;Follow-up;Spiritual support  Referral From Nurse  Spiritual Encounters  Spiritual Needs Prayer;Emotional;Grief support  Stress Factors  Patient Stress Factors Exhausted;Major life changes  Family Stress Factors Exhausted;Family relationships;Loss  Cristopher Peru, [facility] Pager [phone]

## 2017-08-14 NOTE — ED Notes (Addendum)
650 mg rectal tylenol given at 0130 per verbal order from DR wickline

## 2017-08-14 NOTE — ED Provider Notes (Signed)
Sepsis - Repeat Assessment  Performed at:    0525am  Vitals     Blood pressure (!) 107/58, pulse (!) 137, temperature (!) 104.1 F (40.1 C), temperature source Axillary, resp. rate (!) 34, height 1.829 m (6'), weight 97.5 kg (215 lb), SpO2 99 %.  Heart:     Tachycardic  Lungs:    Rhonchi  Capillary Refill:   <2 sec  Peripheral Pulse:   Radial pulse palpable  Skin:     Normal Color   Pt responding to voice, will now speak to wife He is still tachypneic Awaiting admission      Zadie Rhine, MD 08/14/17 [phone]

## 2017-08-14 NOTE — Procedures (Signed)
Intubation Procedure Note Joseph [name] [id number] 24-Jan-1951  Procedure: Intubation Indications: Airway protection and maintenance  Procedure Details Consent: Risks of procedure as well as the alternatives and risks of each were explained to the (patient/caregiver).  Consent for procedure obtained. Time Out: Verified patient identification, verified procedure, site/side was marked, verified correct patient position, special equipment/implants available, medications/allergies/relevent history reviewed, required imaging and test results available.  Performed  Maximum sterile technique was used including gloves and mask.  MAC size 4 blade Size 8 ETT place, 25 at lip 1 attempt, Grade 1 view  Evaluation Hemodynamic Status: BP stable throughout; O2 sats: stable throughout Patient's Current Condition: stable Complications: No apparent complications Patient did tolerate procedure well. Chest X-ray ordered to verify placement.  CXR: pending.   Joseph [name] 08/14/2017

## 2017-08-14 NOTE — Progress Notes (Addendum)
PULMONARY / CRITICAL CARE MEDICINE   Name: Joseph [name] MRN: [id number] DOB: March 29, 1951    Patient had increasing dyspnea, work of breathing, paradoxical breathing in afternoon.  He was started on BiPAP but could not tolerate the mask I had a discussion with the patient and his wife regarding his clinical status and recommended intubation as he appears to be tiring out.  Patient agreed with this.  He made it clear that he does not want prolonged life support, no trach.   We discussed CPR.  He would be okay with resuscitation but if the prognosis is poor he would like to be made comfort care.  Bedside US shows plethoric IVC, LV seems to be OK. No pericardial effusion.  - We will proceed with intubation - Follow formal echo, troponin. Recheck labs, lactic acid, ABG - CT chest abdomen pelvis to determine source of infection - Start Noepi. Wean off neo. Continue vasopressin  The patient is critically ill with multiple organ system failure and requires high complexity decision making for assessment and support, frequent evaluation and titration of therapies, advanced monitoring, review of radiographic studies and interpretation of complex data.   Additional Critical Care Time devoted to patient care services, exclusive of separately billable procedures, described in this note is 35  minutes.   Chilton Greathouse MD [place] Pager [phone] If no answer or after 3pm call: [phone] 08/14/2017, 3:43 PM

## 2017-08-14 NOTE — ED Provider Notes (Signed)
[facility] EMERGENCY DEPARTMENT Provider Note   CSN: [id number] Arrival date & time: 08/14/17  0057     History   Chief Complaint Chief Complaint  Patient presents with  . Altered Mental Status   Level 5 caveat due to altered mental status HPI Joseph [name] is a 66 y.o. male.  The history is provided by the patient and the spouse. The history is limited by the condition of the patient.  Altered Mental Status   This is a new problem. Episode onset: Several hours ago. The problem has been rapidly worsening. Associated symptoms include confusion. His past medical history is significant for COPD.  Patient with history of coronary disease, COPD, rheumatoid arthritis managed on prednisone and methotrexate presents with altered mental status Per wife, patient has had increasing cough recently, and was seen by his pulmonologist and started on Augmentin earlier this week Over the past several hours she is noted he has become more altered and confused with worsening cough. No Vomiting or diarrhea He takes prednisone daily Past Medical History:  Diagnosis Date  . CAD (coronary artery disease)    MI 2007, CABG  . Cataracts, bilateral   . Complication of anesthesia    DIFFICULTY AFTER CABG HAD CONFUSION POST OP  . COPD (chronic obstructive pulmonary disease) (HCC)   . Diabetes mellitus    Dx 07-2008 A1C 6.2  . Esophageal dysmotility   . Fatty liver 10/11/09  . Gastroesophageal reflux disease   . Gastroparesis   . Hiatal hernia   . Hypertension   . Neuropathy    (related to RA per neuro note 05-2011)  . Onychomycosis   . Osteoporosis    per DEXA 05-2008  . Oxygen dependent    3l  . Peripheral polyneuropathy    Severe, causing problems with his feet, see surgeries  . Psoriasis   . Rheumatoid arthritis [facility])    [facility] Rheumatology    Patient Active Problem List   Diagnosis Date Noted  . PVC (premature ventricular contraction) 07/29/2017  . Atrial flutter  (HCC) 10/02/2016  . Pulmonary infiltrates 02/25/2016  . Hemoptysis 02/23/2016  . Chronic diastolic heart failure (HCC)   . Troponin level elevated   . Chest pain 01/19/2016  . PCP NOTES >>>>>>>>>>>>>>>>>>>>>>>>>>>>>>>>>>>> 05/16/2015  . Chronic obstructive pulmonary emphysema (HCC) 01/13/2015  . Hepatic steatosis 04/17/2014  . Bronchiectasis (HCC) 05/05/2013  . Recurrent pneumonia 11/30/2012  . Pulmonary nodules 11/30/2012  . Gastroparesis 10/20/2012  . Barrett's esophagus 10/19/2012  . Chronic respiratory failure with hypoxia (HCC) 10/17/2012  . Annual physical exam 05/08/2012  . Sicca syndrome (HCC) 10/11/2011  . CAD (coronary artery disease)   . Carotid stenosis 12/10/2010  . Abnormal nuclear cardiac imaging test 12/04/2010  . Rheumatoid arthritis with rheumatoid factor (HCC) 11/02/2010  . HOARSENESS 10/22/2010  . DYSLIPIDEMIA 09/12/2009  . Type 2 diabetes, controlled, with neuropathy (HCC) 08/01/2008  . Pain in Soft Tissues of Limb 07/13/2008  . Osteoporosis 07/13/2008  . COPD with emphysema (HCC) 07/20/2007  . Coronary atherosclerosis of unspecified type of vessel, native or graft 11/25/2006  . 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 Essential hypertension 09/16/2006  . GERD, Barrett's (09-2012 ), gastroparesis 06/06/2006  . PSORIASIS 06/06/2006    Past Surgical History:  Procedure Laterality Date  . A-FLUTTER ABLATION N/A 11/27/2016   Procedure: A-Flutter Ablation;  Surgeon: Marinus Maw, MD;  Location: [facility] INVASIVE CV LAB;  Service: Cardiovascular;  Laterality: N/A;  . achiles tendon surgery  8/09  .  CARDIAC CATHETERIZATION N/A 01/23/2016   Procedure: Left Heart Cath and Cors/Grafts Angiography;  Surgeon: Lyn Records, MD;  Location: [facility] INVASIVE CV LAB;  Service: Cardiovascular;  Laterality: N/A;  . CARDIOVERSION N/A 10/23/2016   Procedure: CARDIOVERSION;  Surgeon: Rollene Rotunda, MD;  Location: [facility] ENDOSCOPY;  Service: Cardiovascular;  Laterality: N/A;  .  CATARACT EXTRACTION Right 03/2017  . CATARACT EXTRACTION Left 05/2017  . CHOLECYSTECTOMY    . CORONARY ARTERY BYPASS GRAFT  2007   (LIMA to the LAD, left radial to obtuse marginal, SVG to first diagonal, SVG to PDA. His ejection fraction to 50-55%  . ESOPHAGOGASTRODUODENOSCOPY N/A 10/19/2012   Procedure: ESOPHAGOGASTRODUODENOSCOPY (EGD);  Surgeon: Hart Carwin, MD;  Location: [facility] ENDOSCOPY;  Service: Endoscopy;  Laterality: N/A;  the dilatation is possible.   Marland Kitchen FOOT SURGERY     to tendon release and repair of osteomyelitis   . LARYNGOSCOPY N/A 01/21/2014   Procedure: LARYNGOSCOPY;  Surgeon: Christia Reading, MD;  Location: [facility] OR;  Service: ENT;  Laterality: N/A;  micro direct laryngoscopy with prolaryn injection/jet venturi ventilation  . TOE AMPUTATION  4-12/ 3 14   d/t a deformity, found to have osteomyelitis, complicated by post-op foot strtess FX       Home Medications    Prior to Admission medications   Medication Sig Start Date End Date Taking? Authorizing Provider  albuterol (PROAIR HFA) 108 (90 Base) MCG/ACT inhaler Inhale 2 puffs into the lungs every 4 (four) hours as needed for wheezing or shortness of breath. 02/04/17   [name], MD  amitriptyline (ELAVIL) 10 MG tablet Take 10 mg by mouth at bedtime.    [provider]  amoxicillin-clavulanate (AUGMENTIN) 875-125 MG tablet Take 1 tablet by mouth 2 (two) times daily. 08/11/17   [name], MD  Ascorbic Acid (VITAMIN C) 500 MG tablet Take 500 mg by mouth daily.      [provider]  aspirin EC 81 MG tablet Take 81 mg by mouth daily.    [provider]  atorvastatin (LIPITOR) 80 MG tablet TAKE 1 TABLET(80 MG) BY MOUTH DAILY 10/21/16   Rollene Rotunda, MD  B Complex-C (SUPER B COMPLEX PO) Take by mouth daily.    [provider]  B-D TB SYRINGE 1CC/27GX1/2" 27G X 1/2" 1 ML MISC Reported on 02/16/2016 02/18/15   [provider]  Carbonyl Iron (PERFECT IRON PO) Take 1 tablet by mouth every  morning.     [provider]  clobetasol cream (TEMOVATE) 0.05 % Apply 1 application topically 2 (two) times daily. 10/19/15   Wanda Plump, MD  Fluticasone-Umeclidin-Vilant (TRELEGY ELLIPTA) 100-62.5-25 MCG/INH AEPB Inhale 1 puff daily into the lungs. 07/08/17   [name], MD  folic acid (FOLVITE) 800 MCG tablet Take 800 mcg by mouth daily.    [provider]  furosemide (LASIX) 20 MG tablet TAKE 2 TABLETS BY MOUTH EVERY MORNING AND 1 TABLET BY MOUTH AT 4 PM 07/15/17   Rollene Rotunda, MD  hydrocortisone cream 1 % Apply 1 application topically 3 (three) times daily as needed for itching (skin irritation). 10/23/16   Rollene Rotunda, MD  Lancets [facility] ULTRASOFT) lancets USE TO TEST BLOOD SUGAR NO MORE THAN TWICE DAILY 07/01/16   Wanda Plump, MD  Loteprednol Etabonate (LOTEMAX) 0.5 % GEL Place 1 drop into both eyes every morning. daily 07/01/14   [provider]  methotrexate 50 MG/2ML injection Inject 7 mg into the muscle once a week.  11/07/16   [provider]  metoprolol tartrate (LOPRESSOR) 25 MG tablet TAKE 1 TABLET BY MOUTH TWICE A DAY 07/22/17   Rollene Rotunda, MD  Misc. Devices (ACAPELLA) MISC Use as directed 12/20/13   [name], MD  nitroGLYCERIN (NITROSTAT) 0.4 MG SL tablet Place 1 tablet (0.4 mg total) under the tongue every 5 (five) minutes as needed for chest pain (up to 3 doses for severe chest pain. If no relief after 3rd dose, proceed to the ED for an evaluation). 10/04/16   Rollene Rotunda, MD  NON FORMULARY [place]ERGO PORTABLE PULSE DOSE OXYGEN oxygen 3L per nasal cannula 24 hours a day Dx: 496    [provider]  ONE TOUCH ULTRA TEST test strip USE TO TEST BLOOD SUGAR NO MORE THAN TWICE DAILY 07/01/16   Wanda Plump, MD  oxyCODONE-acetaminophen (PERCOCET) 7.5-325 MG per tablet Take 1 tablet by mouth 3 (three) times daily. 3 times a day    [provider]  pantoprazole (PROTONIX) 40 MG tablet Take 1 tablet (40 mg total) by mouth 2  (two) times daily. 05/16/17   Armbruster, Willaim Rayas, MD  potassium chloride (K-DUR) 10 MEQ tablet TAKE 4 TABLETS BY MOUTH EVERY MORNING AND TAKE 2 TABLETS BY MOUTH EVERY EVENING 10/21/16   Rollene Rotunda, MD  predniSONE (DELTASONE) 5 MG tablet Take by mouth daily. PT Take as directed    [provider]  pregabalin (LYRICA) 75 MG capsule Take 75 mg by mouth 2 (two) times daily.    [provider]  Respiratory Therapy Supplies (FLUTTER) DEVI 1 Inhaler by Does not apply route 2 (two) times daily. 05/18/15   [name], MD  vitamin B-12 (CYANOCOBALAMIN) 250 MCG tablet Take 250 mcg by mouth daily.      [provider]    Family History Family History  Problem Relation Age of Onset  . Heart disease Mother   . Diverticulitis Mother   . Heart disease Father   . Heart attack Father        x2  . Colon cancer Neg Hx   . Prostate cancer Neg Hx     Social History Social History   Tobacco Use  . Smoking status: Former Smoker    Packs/day: 1.00    Years: 40.00    Pack years: 40.00    Last attempt to quit: 08/26/2005    Years since quitting: 11.9  . Smokeless tobacco: Former Neurosurgeon    Quit date: 04/02/2006  . Tobacco comment: smoked x 40 years, 1 ppd  Substance Use Topics  . Alcohol use: No  . Drug use: No     Allergies   Patient has no known allergies.   Review of Systems Review of Systems  Unable to perform ROS: Mental status change  Psychiatric/Behavioral: Positive for confusion.     Physical Exam Updated Vital Signs BP 122/67   Pulse (!) 132   Temp (!) 104.9 F (40.5 C) (Rectal)   Ht 1.829 m (6')   Wt 97.5 kg (215 lb)   SpO2 98%   BMI 29.16 kg/m   Physical Exam CONSTITUTIONAL: Elderly, ill-appearing HEAD: Normocephalic/atraumatic EYES: EOMI/PERRL ENMT: Mucous membranes dry NECK: supple no meningeal signs SPINE/BACK:entire spine nontender CV: S1/S2 noted, no murmurs/rubs/gallops noted, tachycardic LUNGS: Lungs are clear to auscultation  bilaterally, no apparent distress ABDOMEN: soft, nontender GU:no cva tenderness NEURO: Pt is somnolent but arousable to voice intermittently follow commands, moves all extremities x4. EXTREMITIES: pulses normal/equal, full ROM SKIN: warm, color normal PSYCH:  Unable to assess  ED Treatments / Results  Labs (all labs ordered are listed, but only abnormal results are displayed) Labs Reviewed  COMPREHENSIVE METABOLIC PANEL - Abnormal; Notable for the following components:      Result Value   Sodium 132 (*)    Potassium 2.8 (*)    Chloride 96 (*)    Glucose, Bld 154 (*)    AST 63 (*)    Total Bilirubin 1.5 (*)    All other components within normal limits  CBC WITH DIFFERENTIAL/PLATELET - Abnormal; Notable for the following components:   RDW 16.1 (*)    Platelets 119 (*)    Neutro Abs 1.2 (*)    All other components within normal limits  I-STAT CG4 LACTIC ACID, ED - Abnormal; Notable for the following components:   Lactic Acid, Venous 2.62 (*)    All other components within normal limits  I-STAT CG4 LACTIC ACID, ED - Abnormal; Notable for the following components:   Lactic Acid, Venous 4.77 (*)    All other components within normal limits  CULTURE, BLOOD (ROUTINE X 2)  CULTURE, BLOOD (ROUTINE X 2)  URINE CULTURE  CSF CULTURE  GRAM STAIN  PROTIME-INR  URINALYSIS, ROUTINE W REFLEX MICROSCOPIC  INFLUENZA PANEL BY PCR (TYPE A & B)  APTT  CSF CELL COUNT WITH DIFFERENTIAL  CSF CELL COUNT WITH DIFFERENTIAL  GLUCOSE, CSF  PROTEIN, CSF  HERPES SIMPLEX VIRUS(HSV) DNA BY PCR    EKG  EKG Interpretation  Date/Time:  Thursday August 14 2017 01:12:00 EST Ventricular Rate:  134 PR Interval:    QRS Duration: 70 QT Interval:  252 QTC Calculation: 377 R Axis:   50 Text Interpretation:  Sinus tachycardia Multiform ventricular premature complexes Probable left atrial enlargement Repol abnrm suggests ischemia, diffuse leads changed noted from prior Confirmed by Zadie RhineWickline, Lyrik Dockstader  [phone]([id number]) on 08/14/2017 1:22:33 AM       Radiology Ct Head Wo Contrast  Result Date: 08/14/2017 CLINICAL DATA:  Altered mental status and fever EXAM: CT HEAD WITHOUT CONTRAST TECHNIQUE: Contiguous axial images were obtained from the base of the skull through the vertex without intravenous contrast. COMPARISON:  Head CT April 25, 2006; brain MRI April 27, 2006 FINDINGS: Brain: The ventricles are normal in size and configuration. There is no appreciable intracranial mass, hemorrhage, extra-axial fluid collection, or midline shift. There is an age uncertain infarct in the anterior limb of the left internal capsule. Elsewhere gray-white compartments appear normal. Vascular: No hyperdense vessel. There is calcification in each carotid siphon region. Skull: Bony calvarium appears intact. Sinuses/Orbits: There is extensive opacification of most of the right frontal sinus. There is opacification of multiple ethmoid air cells bilaterally. There is opacification of much of the right maxillary antrum with an air-fluid level on the right. There is moderate mucosal thickening in the left maxillary antrum. There is air-fluid level in the right sphenoid sinus. Orbits appear symmetric bilaterally. Other: Mastoid air cells are clear. IMPRESSION: 1. Age uncertain and possibly recent infarct involving the anterior limb of the left internal capsule. Elsewhere gray-white compartments appear normal. No mass or hemorrhage. 2. Multifocal paranasal sinus disease with air-fluid levels in the right sphenoid and right maxillary sinuses. 3.  Foci of arterial vascular calcification noted. Electronically Signed   By: Bretta BangWilliam  Woodruff III M.D.   On: 08/14/2017 02:53   Dg Chest Port 1 View  Result Date: 08/14/2017 CLINICAL DATA:  Altered mental status. EXAM: PORTABLE CHEST 1 VIEW COMPARISON:  Most recent radiographs 02/10/2017. Most recent CT 02/23/2016 FINDINGS:  Post median sternotomy and CABG. Mild cardiomegaly. Unchanged  mediastinal contours. Emphysema. Bleb in the left upper lobe. Scarring in the periphery of the right upper lobe. No consolidation, pleural fluid or pneumothorax. No pulmonary edema. IMPRESSION: 1. No acute abnormality. 2. Emphysema with multifocal scarring. 3. Stable mild cardiomegaly. Electronically Signed   By: Rubye Oaks M.D.   On: 08/14/2017 01:48    Procedures .Lumbar Puncture Date/Time: 08/14/2017 4:58 AM Performed by: Zadie Rhine, MD Authorized by: Zadie Rhine, MD   Consent:    Consent obtained:  Written   Consent given by:  Spouse Pre-procedure details:    Procedure purpose:  Diagnostic   Preparation: Patient was prepped and draped in usual sterile fashion   Procedure details:    Lumbar space:  L3-L4 interspace   Patient position:  Sitting   Needle gauge:  20   Ultrasound guidance: no     Number of attempts:  2   Fluid appearance:  Blood-tinged   Tubes of fluid:  4   Total volume (ml):  10 Post-procedure:    Puncture site:  Adhesive bandage applied   Patient tolerance of procedure:  Tolerated well, no immediate complications Comments:     Initial attempt at lumbar puncture was unsuccessful when he was in the left lateral position During this attempt he also began having large volume diarrhea so this initial attempt was aborted He was cleansed appropriately and then put up in the sitting position, was resterilized and I was able to obtain CSF He tolerated well, no worsening symptoms, moves all extremities x4    CRITICAL CARE Performed by: Joya Gaskins Total critical care time: 50 minutes Critical care time was exclusive of separately billable procedures and treating other patients. Critical care was necessary to treat or prevent imminent or life-threatening deterioration. Critical care was time spent personally by me on the following activities: development of treatment plan with patient and/or surrogate as well as nursing, discussions with  consultants, evaluation of patient's response to treatment, examination of patient, obtaining history from patient or surrogate, ordering and performing treatments and interventions, ordering and review of laboratory studies, ordering and review of radiographic studies, pulse oximetry and re-evaluation of patient's condition.   Medications Ordered in ED Medications  vancomycin (VANCOCIN) IVPB 1000 mg/200 mL premix (not administered)  ampicillin (OMNIPEN) 2 g in sodium chloride 0.9 % 50 mL IVPB (not administered)  cefTRIAXone (ROCEPHIN) 1 g in dextrose 5 % 50 mL IVPB (not administered)  lidocaine (PF) (XYLOCAINE) 1 % injection (not administered)  acetaminophen (TYLENOL) suppository 650 mg (not administered)  sodium chloride 0.9 % bolus 1,000 mL (0 mLs Intravenous Stopped 08/14/17 0331)    And  sodium chloride 0.9 % bolus 1,000 mL (0 mLs Intravenous Stopped 08/14/17 0349)    And  sodium chloride 0.9 % bolus 1,000 mL (1,000 mLs Intravenous New Bag/Given 08/14/17 0247)  cefTRIAXone (ROCEPHIN) 1 g in dextrose 5 % 50 mL IVPB (0 g Intravenous Stopped 08/14/17 0246)  azithromycin (ZITHROMAX) 500 mg in dextrose 5 % 250 mL IVPB (0 mg Intravenous Stopped 08/14/17 0349)  acetaminophen (TYLENOL) suppository 650 mg (650 mg Rectal Given 08/14/17 0130)     Initial Impression / Assessment and Plan / ED Course  I have reviewed the triage vital signs and the nursing notes.  Pertinent labs & imaging results that were available during my care of the patient were reviewed by me and considered in my medical decision making (see chart for details).     1:58  AM Patient with recent cough, now having altered mental status and fever, concern for sepsis Code Sepsis has been called, will need admitted to the hospital 2:22 AM Patient still altered, but does have supple neck Wife does report that he has had recent headache which she reports he rarely complains of Workup pending at this time 3:32 AM Urinalysis is  negative Patient is still somnolent but is arousing to voice and will open eyes However he is still altered Given the fact that his chest x-ray is negative, his urinalysis is negative, I will proceed with lumbar puncture to rule out meningitis He does not appear to have any contraindications Wife consents to have lumbar puncture for patient 3:37 AM Broad-spectrum antibiotic will be ordered We will consult critical care because patient became hypotensive 3:55 AM Consulted critical care, due to the fact the patient is hypotensive and now his lactate is increasing despite fluid resuscitation 4:59 AM Lumbar puncture is complete Critical care is at bedside, patient will need to be admitted to the ICU Patient tachypneic, but is not hypoxic and is still responding to voice, he does not require emergent intubation at this time Final Clinical Impressions(s) / ED Diagnoses   Final diagnoses:  Delirium  Sepsis, due to unspecified organism [facility])    ED Discharge Orders    None       Zadie Rhine, MD 08/14/17 0501

## 2017-08-14 NOTE — Progress Notes (Signed)
Pharmacy Antibiotic Note  Joseph [name] is a 66 y.o. male admitted on 08/14/2017 with altered mental status.  Pharmacy has been consulted for Vancomycin/Cefepime/Ampicillin/Acyclovir/Azithromycin dosing for r/o PNA and r/o meningitis. WBC WNL. Renal function good. Lactic acid going up.   Plan: Vancomycin 1250 mg IV q12h Cefepime 2g IV q8h Ampicillin 2g IV q4h Acyclovir 10 mg/kg IV q8h Azithromycin 500 mg IV q24h Trend WBC, temp, renal function  F/U infectious work-up Drug levels as indicated   Height: 6' (182.9 cm) Weight: 215 lb (97.5 kg) IBW/kg (Calculated) : 77.6  Temp (24hrs), Avg:104.1 F (40.1 C), Min:103.3 F (39.6 C), Max:104.9 F (40.5 C)  Recent Labs  Lab 08/14/17 0121 08/14/17 0137 08/14/17 0340  WBC 5.0  --   --   CREATININE 0.82  --   --   LATICACIDVEN  --  2.62* 4.77*    Estimated Creatinine Clearance: 108.7 mL/min (by C-G formula based on SCr of 0.82 mg/dL).    No Known Allergies   Abran Duke 08/14/2017 5:50 AM

## 2017-08-14 NOTE — Progress Notes (Signed)
Pt stated he did not feel like he was getting enough and wanted to be taken off BIPAP. Dr. Isaiah Serge and RN aware.

## 2017-08-14 NOTE — Procedures (Signed)
Central Venous Catheter Insertion Procedure Note Joseph [name] [id number] August 22, 1951  Procedure: Insertion of Central Venous Catheter Indications: Assessment of intravascular volume, Drug and/or fluid administration and Frequent blood sampling  Procedure Details Consent: Risks of procedure as well as the alternatives and risks of each were explained to the (patient/caregiver).  Consent for procedure obtained. Time Out: Verified patient identification, verified procedure, site/side was marked, verified correct patient position, special equipment/implants available, medications/allergies/relevent history reviewed, required imaging and test results available.  Performed  Maximum sterile technique was used including antiseptics, cap, gloves, gown, hand hygiene, mask and sheet. Skin prep: Chlorhexidine; local anesthetic administered A antimicrobial bonded/coated triple lumen catheter was placed in the left internal jugular vein using the Seldinger technique. Ultrasound guidance used.Yes.   Catheter placed to 20 cm. Blood aspirated via all 3 ports and then flushed x 3. Line sutured x 2 and dressing applied.  Evaluation Blood flow good Complications: No apparent complications Patient did tolerate procedure well. Chest X-ray ordered to verify placement.  CXR: pending.  Brett Canales Minor ACNP Adolph Pollack PCCM Pager [phone] till 1 pm If no answer page [phone] 08/14/2017, 9:53 AM

## 2017-08-14 NOTE — ED Triage Notes (Addendum)
Per [facility], pt from home and diagnosed with a pulmonary infection on Monday and has been taking amoxicillin ever since. EMS reports headache, malaise, and fever today. Pt received tylenol at 4 for headache. Wife took tympanic temperature of 106 at home. Pt normally alert and oriented x4. Pt is alert to voice, but cannot answer questions or follow commands. 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 Wife reports patient was alert and oriented this afternoon. Pt normally on 3 L 

## 2017-08-14 NOTE — Procedures (Signed)
Arterial Catheter Insertion Procedure Note Joseph [name] [id number] 1951/01/23  Procedure: Insertion of Arterial Catheter  Indications: Blood pressure monitoring  Procedure Details Consent: Unable to obtain consent because of emergent medical necessity. Time Out: Verified patient identification, verified procedure, site/side was marked, verified correct patient position, special equipment/implants available, medications/allergies/relevent history reviewed, required imaging and test results available.  Performed  Maximum sterile technique was used including antiseptics, cap, gloves, gown, hand hygiene, mask and sheet. Skin prep: Chlorhexidine; 20 gauge catheter was inserted into right radial artery using the Seldinger technique.  Evaluation Blood flow good; BP tracing good. Complications: No apparent complications.  Aline inserted per order along with Elbert Ewings, Charge RRT. No complications, initial BP of 66/27. RN, NP aware of BP.   Loyal Jacobson [facility] 08/14/2017

## 2017-08-14 NOTE — Progress Notes (Signed)
PULMONARY / CRITICAL CARE MEDICINE   Name: Joseph [name] MRN: [id number] DOB: March 09, 1951    ADMISSION DATE:  08/14/2017 CONSULTATION DATE:  08/14/2017  REFERRING MD: Dr. Bebe Shaggy  CHIEF COMPLAINT: Fever  HISTORY OF PRESENT ILLNESS:   66 year old male with past medical history as below, which is significant for rheumatoid arthritis (on daily prednisone and methotrexate), COPD and bronchiectasis on 3 L oxygen, diabetes, GERD, recurrent pneumonia secondary to chronic aspiration/reflux, and coronary artery disease.  He has a several day history of what he considered to be a sinus infection that he picked up from his wife.  He presented to pulmonary clinic for this and was written a prescription for Augmentin.  His symptoms progressed and he developed high fevers, headache, malaise.  He presented to the emergency department on 12/20 with these complaints.  Symptoms were felt to be infectious in origin and potentially meningitis.  He had LP done in the emergency department and was started on broad-spectrum antibiotic.  He was hypotensive despite IV fluid bolus and PCCM was asked to admit.    SUBJECTIVE:  66 year old male who has refractory hypertension despite 4.5 L of fluid and initiation of Solu-Cortef. Is being in place clear site is in place.  VITAL SIGNS: BP (!) 91/40   Pulse (!) 132   Temp (!) 104.1 F (40.1 C) (Axillary)   Resp (!) 32   Ht 6' (1.829 m)   Wt 97.5 kg (215 lb)   SpO2 95%   BMI 29.16 kg/m   HEMODYNAMICS:    VENTILATOR SETTINGS:    INTAKE / OUTPUT: I/O last 3 completed shifts: In: 3400 [IV Piggyback:3400] Out: 200 [Urine:200]  PHYSICAL EXAMINATION: General: Elderly white male who  intermittently follow commands HEENT: No JVD or lymphadenopathy appreciated, oral mucosa dry PSY: Confused Neuro: Follows commands intermittently CV: Heart sounds are regular tach 128 systolic blood pressure by A-line 70 PULM: Mild rhonchi GI: Abdomen soft  nontender Extremities: warm/dry, no lower extremity edema  Skin: Lower extremity chronic venous stasis changes   LABS:  BMET Recent Labs  Lab 08/14/17 0121  NA 132*  K 2.8*  CL 96*  CO2 22  BUN 11  CREATININE 0.82  GLUCOSE 154*    Electrolytes Recent Labs  Lab 08/14/17 0121  CALCIUM 8.9    CBC Recent Labs  Lab 08/14/17 0121  WBC 5.0  HGB 14.6  HCT 42.9  PLT 119*    Coag's Recent Labs  Lab 08/14/17 0121 08/14/17 0152  APTT  --  29  INR 1.13  --     Sepsis Markers Recent Labs  Lab 08/14/17 0137 08/14/17 0340  LATICACIDVEN 2.62* 4.77*    ABG No results for input(s): PHART, PCO2ART, PO2ART in the last 168 hours.  Liver Enzymes Recent Labs  Lab 08/14/17 0121  AST 63*  ALT 42  ALKPHOS 81  BILITOT 1.5*  ALBUMIN 3.7    Cardiac Enzymes No results for input(s): TROPONINI, PROBNP in the last 168 hours.  Glucose No results for input(s): GLUCAP in the last 168 hours.  Imaging Ct Head Wo Contrast  Result Date: 08/14/2017 CLINICAL DATA:  Altered mental status and fever EXAM: CT HEAD WITHOUT CONTRAST TECHNIQUE: Contiguous axial images were obtained from the base of the skull through the vertex without intravenous contrast. COMPARISON:  Head CT April 25, 2006; brain MRI April 27, 2006 FINDINGS: Brain: The ventricles are normal in size and configuration. There is no appreciable intracranial mass, hemorrhage, extra-axial fluid collection, or midline shift. There is  an age uncertain infarct in the anterior limb of the left internal capsule. Elsewhere gray-white compartments appear normal. Vascular: No hyperdense vessel. There is calcification in each carotid siphon region. Skull: Bony calvarium appears intact. Sinuses/Orbits: There is extensive opacification of most of the right frontal sinus. There is opacification of multiple ethmoid air cells bilaterally. There is opacification of much of the right maxillary antrum with an air-fluid level on the right.  There is moderate mucosal thickening in the left maxillary antrum. There is air-fluid level in the right sphenoid sinus. Orbits appear symmetric bilaterally. Other: Mastoid air cells are clear. IMPRESSION: 1. Age uncertain and possibly recent infarct involving the anterior limb of the left internal capsule. Elsewhere gray-white compartments appear normal. No mass or hemorrhage. 2. Multifocal paranasal sinus disease with air-fluid levels in the right sphenoid and right maxillary sinuses. 3.  Foci of arterial vascular calcification noted. Electronically Signed   By: Bretta Bang III M.D.   On: 08/14/2017 02:53   Dg Chest Port 1 View  Result Date: 08/14/2017 CLINICAL DATA:  Altered mental status. EXAM: PORTABLE CHEST 1 VIEW COMPARISON:  Most recent radiographs 02/10/2017. Most recent CT 02/23/2016 FINDINGS: Post median sternotomy and CABG. Mild cardiomegaly. Unchanged mediastinal contours. Emphysema. Bleb in the left upper lobe. Scarring in the periphery of the right upper lobe. No consolidation, pleural fluid or pneumothorax. No pulmonary edema. IMPRESSION: 1. No acute abnormality. 2. Emphysema with multifocal scarring. 3. Stable mild cardiomegaly. Electronically Signed   By: Rubye Oaks M.D.   On: 08/14/2017 01:48     STUDIES:  CT head 12/20 > Age uncertain and possibly recent infarct involving the anterior limb of the left internal capsule. Elsewhere gray-white compartments appear normal. No mass or hemorrhage. Multifocal paranasal sinus disease with air-fluid levels in the right sphenoid and right maxillary sinuses.  CULTURES: Blood 12/20 > Urine 12/20 > CSF 12/20 > Flu PCR 12/20 > NEG  ANTIBIOTICS: Augmentin 12/17 >12/20 Ampicillin 12/20 > Vancomycin 12/20 >  Cefepime 12/20 > Azithromycin 12/20 > Acyclovir 12/20 >  SIGNIFICANT EVENTS: 12/20 admit  LINES/TUBES:   DISCUSSION: 66 year old male with COPD and RA on pred and methotrexate presenting with headache, weakness, and  high fevers. Unclear origin. Sinusitis on CT. LP results pending. Covering meningitis. At risk intubation.  08/14/2017  remains hypotensive despite fluid resuscitation.  We will start pressure support possible central line insertion and arterial line insertion.  ASSESSMENT / PLAN:  PULMONARY A: Tachypnea compensation for lactic acidosis At risk airway compromise Chronic hypoxemic respiratory failure (3L at baseline) COPD without acute exacerbation. Bronchiectasis  P:   At risk for intubation, monitor closely. Scheduled nebs Solu-Cortef a place of prednisone  CARDIOVASCULAR A:  Septic shock Atrial fibrillation with RVR (with history of flutter s/p ablation) currently sinus tach rate of 128  P:  ICU hemodynamic montoring S/p 4.5 Liters IVF resuscitation. Would give at least 1 more if BP doesn't hold. Continue to trend lactic until clearing Control fever to hopefull slow HR, if not consider amio.  Place a line and Non invasive HD monitor May need cvl for cvp monitor and pressor support Low dose pressor support with neo synephrine and add vasopressin if cvl placed and check coox if cvl placed.  RENAL Lab Results  Component Value Date   CREATININE 0.82 08/14/2017   CREATININE 0.59 06/24/2017   CREATININE 0.70 03/06/2017   CREATININE 0.74 10/14/2016   Recent Labs  Lab 08/14/17 0121  K 2.8*    A:  Hypokalemia Hyponatremia  P:   Supp K Repeat BMP  GASTROINTESTINAL A:   Diarrhea   P:   NPO C diff eval pending  HEMATOLOGIC A:   Mild thrombocytopenia  P:  SQ heparin Follow CBC Trend platelets Platelets continue to decrease will need to stop anticoagulation  INFECTIOUS A:   Hyperpyrexia of unknown origin: Concern for meningitis with headache, sinusitis, and high fevers. R/o CAP as well.   Immunocompromised host  P:   Follow cultures LP results pending, 08/14/2017 through spinal fluid with no organism present final results pending ABX as above (amp,  cefepime, vanco, azithro) want to cover pseudomonas due to bronchiectasis.  Tylenol PRN for fevers Add acyclovir  08/14/2017 check pro-calcitonin protocol.  AUTOIMMUNE A:   RA - followed at [facility]  P:   Holding methotrexate until able to take PO Holding prednisone in favor of stress dose hydrocort.  ENDOCRINE A:   Relative AI  P:   Stress dose steroids. Solu-Cortef given stat at 0 812 22,018  NEUROLOGIC A:   Acute metabolic encephalopathy  P:   Monitor closely. Hopefull should improve with fever.   08/14/2017 more responsive.   FAMILY  - Updates: Wife updated  - Inter-disciplinary family meet or Palliative Care meeting due by:  12/27   -App CCT 60 min   Brett Canales Denaisha Swango ACNP [facility] PCCM Pager [phone] till 1 pm If no answer page [phone] 08/14/2017, 8:20 AM

## 2017-08-14 NOTE — ED Notes (Signed)
Patient transported to CT 

## 2017-08-15 ENCOUNTER — Inpatient Hospital Stay (HOSPITAL_COMMUNITY): Payer: Medicare HMO

## 2017-08-15 ENCOUNTER — Other Ambulatory Visit: Payer: Self-pay

## 2017-08-15 DIAGNOSIS — R0600 Dyspnea, unspecified: Secondary | ICD-10-CM

## 2017-08-15 LAB — BASIC METABOLIC PANEL
ANION GAP: 9 (ref 5–15)
BUN: 12 mg/dL (ref 6–20)
CALCIUM: 7 mg/dL — AB (ref 8.9–10.3)
CO2: 22 mmol/L (ref 22–32)
CREATININE: 0.81 mg/dL (ref 0.61–1.24)
Chloride: 105 mmol/L (ref 101–111)
GLUCOSE: 387 mg/dL — AB (ref 65–99)
Potassium: 2.9 mmol/L — ABNORMAL LOW (ref 3.5–5.1)
Sodium: 136 mmol/L (ref 135–145)

## 2017-08-15 LAB — HERPES SIMPLEX VIRUS(HSV) DNA BY PCR
HSV 1 DNA: NEGATIVE
HSV 2 DNA: NEGATIVE

## 2017-08-15 LAB — BLOOD GAS, ARTERIAL
ACID-BASE DEFICIT: 0.6 mmol/L (ref 0.0–2.0)
BICARBONATE: 22.9 mmol/L (ref 20.0–28.0)
Drawn by: 441661
FIO2: 0.4
LHR: 24 {breaths}/min
O2 Saturation: 99.2 %
PATIENT TEMPERATURE: 98.6
PCO2 ART: 33.1 mmHg (ref 32.0–48.0)
PEEP/CPAP: 5 cmH2O
VT: 620 mL
pH, Arterial: 7.454 — ABNORMAL HIGH (ref 7.350–7.450)
pO2, Arterial: 150 mmHg — ABNORMAL HIGH (ref 83.0–108.0)

## 2017-08-15 LAB — URINE CULTURE: CULTURE: NO GROWTH

## 2017-08-15 LAB — ECHOCARDIOGRAM COMPLETE
HEIGHTINCHES: 72 in
Weight: 3604.96 oz

## 2017-08-15 LAB — CBC
HEMATOCRIT: 34.7 % — AB (ref 39.0–52.0)
Hemoglobin: 11.9 g/dL — ABNORMAL LOW (ref 13.0–17.0)
MCH: 30 pg (ref 26.0–34.0)
MCHC: 34.3 g/dL (ref 30.0–36.0)
MCV: 87.4 fL (ref 78.0–100.0)
PLATELETS: 93 10*3/uL — AB (ref 150–400)
RBC: 3.97 MIL/uL — ABNORMAL LOW (ref 4.22–5.81)
RDW: 16.5 % — AB (ref 11.5–15.5)
WBC: 9.1 10*3/uL (ref 4.0–10.5)

## 2017-08-15 LAB — HEMOGLOBIN A1C
Hgb A1c MFr Bld: 6.1 % — ABNORMAL HIGH (ref 4.8–5.6)
Mean Plasma Glucose: 128.37 mg/dL

## 2017-08-15 LAB — MAGNESIUM: Magnesium: 1.3 mg/dL — ABNORMAL LOW (ref 1.7–2.4)

## 2017-08-15 LAB — GLUCOSE, CAPILLARY
GLUCOSE-CAPILLARY: 335 mg/dL — AB (ref 65–99)
Glucose-Capillary: 290 mg/dL — ABNORMAL HIGH (ref 65–99)

## 2017-08-15 LAB — LEGIONELLA PNEUMOPHILA SEROGP 1 UR AG: L. PNEUMOPHILA SEROGP 1 UR AG: NEGATIVE

## 2017-08-15 LAB — PROCALCITONIN: Procalcitonin: 17.92 ng/mL

## 2017-08-15 LAB — TROPONIN I: Troponin I: 1.2 ng/mL (ref <0.03)

## 2017-08-15 LAB — PHOSPHORUS: PHOSPHORUS: 2.4 mg/dL — AB (ref 2.5–4.6)

## 2017-08-15 MED ORDER — PRO-STAT SUGAR FREE PO LIQD
60.0000 mL | Freq: Three times a day (TID) | ORAL | Status: DC
  Administered 2017-08-15: 60 mL
  Filled 2017-08-15 (×5): qty 60

## 2017-08-15 MED ORDER — MAGNESIUM SULFATE 2 GM/50ML IV SOLN
2.0000 g | Freq: Once | INTRAVENOUS | Status: AC
  Administered 2017-08-15: 2 g via INTRAVENOUS
  Filled 2017-08-15: qty 50

## 2017-08-15 MED ORDER — VITAL HIGH PROTEIN PO LIQD
1000.0000 mL | ORAL | Status: DC
  Administered 2017-08-15: 1000 mL

## 2017-08-15 MED ORDER — INSULIN ASPART 100 UNIT/ML ~~LOC~~ SOLN
0.0000 [IU] | SUBCUTANEOUS | Status: DC
  Administered 2017-08-15: 8 [IU] via SUBCUTANEOUS
  Administered 2017-08-15: 11 [IU] via SUBCUTANEOUS
  Administered 2017-08-16: 8 [IU] via SUBCUTANEOUS
  Administered 2017-08-16: 5 [IU] via SUBCUTANEOUS
  Administered 2017-08-16 (×2): 8 [IU] via SUBCUTANEOUS

## 2017-08-15 MED ORDER — POTASSIUM PHOSPHATES 15 MMOLE/5ML IV SOLN
20.0000 mmol | Freq: Once | INTRAVENOUS | Status: AC
  Administered 2017-08-15: 20 mmol via INTRAVENOUS
  Filled 2017-08-15: qty 6.67

## 2017-08-15 MED ORDER — METOCLOPRAMIDE HCL 5 MG/ML IJ SOLN
10.0000 mg | Freq: Four times a day (QID) | INTRAMUSCULAR | Status: DC | PRN
  Filled 2017-08-15: qty 2

## 2017-08-15 MED ORDER — SODIUM CHLORIDE 0.9 % IV SOLN
1.0000 g | Freq: Once | INTRAVENOUS | Status: AC
  Administered 2017-08-15: 1 g via INTRAVENOUS
  Filled 2017-08-15: qty 10

## 2017-08-15 MED ORDER — ONDANSETRON HCL 4 MG/2ML IJ SOLN
4.0000 mg | Freq: Four times a day (QID) | INTRAMUSCULAR | Status: DC | PRN
  Administered 2017-08-15 (×3): 4 mg via INTRAVENOUS
  Filled 2017-08-15 (×3): qty 2

## 2017-08-15 NOTE — H&P (Addendum)
PULMONARY / CRITICAL CARE MEDICINE   Name: DEONDRE MARINARO MRN:   [id number] DOB:   03-27-1951             ADMISSION DATE:  08/14/2017 CONSULTATION DATE:  08/14/2017  REFERRING MD: Dr. Bebe Shaggy  CHIEF COMPLAINT: Fever  HISTORY OF PRESENT ILLNESS:   66 year old male with past medical history as below, which is significant for rheumatoid arthritis (on daily prednisone and methotrexate), COPD and bronchiectasis on 3 L oxygen, diabetes, GERD, recurrent pneumonia secondary to chronic aspiration/reflux, and coronary artery disease.  He has a several day history of what he considered to be a sinus infection that he picked up from his wife.  He presented to pulmonary clinic for this and was written a prescription for Augmentin.  His symptoms progressed and he developed high fevers, headache, malaise.  He presented to the emergency department on 12/20 with these complaints.  Symptoms were felt to be infectious in origin and potentially meningitis.  He had LP done in the emergency department and was started on broad-spectrum antibiotic.  He was hypotensive despite IV fluid bolus and PCCM was asked to admit.  PAST MEDICAL HISTORY :  He  has a past medical history of CAD (coronary artery disease), Cataracts, bilateral, Complication of anesthesia, COPD (chronic obstructive pulmonary disease) (HCC), Diabetes mellitus, Esophageal dysmotility, Fatty liver (10/11/09), Gastroesophageal reflux disease, Gastroparesis, Hiatal hernia, Hypertension, Neuropathy, Onychomycosis, Osteoporosis, Oxygen dependent, Peripheral polyneuropathy, Psoriasis, and Rheumatoid arthritis (HCC).  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  No Known Allergies  No current facility-administered medications on file prior to encounter.        Current Outpatient Medications on File Prior to Encounter  Medication Sig  . albuterol (PROAIR HFA) 108 (90 Base) MCG/ACT inhaler Inhale 2 puffs into the lungs every 4 (four) hours as needed for wheezing or shortness of breath.  Marland Kitchen amitriptyline (ELAVIL) 10 MG tablet Take 10 mg by mouth at bedtime.  Marland Kitchen amoxicillin-clavulanate (AUGMENTIN) 875-125 MG tablet Take 1 tablet by mouth 2 (two) times daily.  . Ascorbic Acid (VITAMIN C) 500 MG tablet Take 500 mg by mouth daily.    Marland Kitchen aspirin EC 81 MG tablet Take 81 mg by mouth daily.  Marland Kitchen atorvastatin (LIPITOR) 80 MG tablet TAKE 1 TABLET(80 MG) BY MOUTH DAILY  . B Complex-C (SUPER B COMPLEX PO) Take by mouth daily.  . B-D TB SYRINGE 1CC/27GX1/2" 27G X 1/2" 1 ML MISC Reported on 02/16/2016  . Carbonyl Iron (PERFECT IRON PO) Take 1 tablet by mouth every morning.   . clobetasol cream (TEMOVATE) 0.05 % Apply 1 application topically 2 (two) times daily.  . Fluticasone-Umeclidin-Vilant (TRELEGY ELLIPTA) 100-62.5-25 MCG/INH AEPB Inhale 1 puff daily into the lungs.  . folic acid (FOLVITE) 800 MCG tablet Take 800 mcg by mouth daily.  . furosemide (LASIX) 20 MG tablet TAKE 2 TABLETS BY MOUTH EVERY MORNING AND 1 TABLET BY MOUTH AT 4 PM  . hydrocortisone cream 1 % Apply 1 application topically 3 (three) times daily as needed for itching (skin irritation).  . Lancets (ONETOUCH ULTRASOFT) lancets USE TO TEST BLOOD SUGAR NO MORE THAN TWICE DAILY  . Loteprednol Etabonate (LOTEMAX) 0.5 % GEL Place 1 drop into both eyes every  morning. daily  . methotrexate 50 MG/2ML injection Inject 7 mg into the muscle once a week.   . metoprolol tartrate (LOPRESSOR) 25 MG tablet TAKE 1 TABLET BY MOUTH TWICE A DAY  . Misc. Devices (ACAPELLA) MISC Use as directed   . nitroGLYCERIN (NITROSTAT) 0.4 MG SL tablet Place 1 tablet (0.4 mg total) under the tongue every 5 (five) minutes as needed for chest pain (up to 3 doses for severe chest pain. If no relief after 3rd dose, proceed to the ED for an evaluation).  . NON FORMULARY [place] PORTABLE PULSE DOSE OXYGEN oxygen 3L per nasal cannula 24 hours a day Dx: 496  . ONE TOUCH ULTRA TEST test strip USE TO TEST BLOOD SUGAR NO MORE THAN TWICE DAILY  . oxyCODONE-acetaminophen (PERCOCET) 7.5-325 MG per tablet Take 1 tablet by mouth 3 (three) times daily. 3 times a day  . pantoprazole (PROTONIX) 40 MG tablet Take 1 tablet (40 mg total) by mouth 2 (two) times daily.  . potassium chloride (K-DUR) 10 MEQ tablet TAKE 4 TABLETS BY MOUTH EVERY MORNING AND TAKE 2 TABLETS BY MOUTH EVERY EVENING  . predniSONE (DELTASONE) 5 MG tablet Take by mouth daily. PT Take as directed  . pregabalin (LYRICA) 75 MG capsule Take 75 mg by mouth 2 (two) times daily.  Marland Kitchen Respiratory Therapy Supplies (FLUTTER) DEVI 1 Inhaler by Does not apply route 2 (two) times daily.  . vitamin B-12 (CYANOCOBALAMIN) 250 MCG tablet Take 250 mcg by mouth daily.    . [DISCONTINUED] omeprazole (PRILOSEC OTC) 20 MG tablet Take 1 tablet (20 mg total) by mouth daily.    FAMILY HISTORY:  His indicated that his mother is deceased. He indicated that his father is deceased. He indicated that his maternal grandmother is deceased. He indicated that his maternal grandfather is deceased. He indicated that his paternal grandmother is deceased. He indicated that his paternal grandfather is deceased. He indicated that the status of his neg hx is unknown.   SOCIAL HISTORY: He  reports that he quit smoking about 11 years ago. He has a 40.00 pack-year smoking history. He quit smokeless tobacco use about 11 years ago. He reports that he does not drink alcohol or use drugs.  REVIEW OF SYSTEMS:   Unable due to encephalopathy  SUBJECTIVE:   VITAL SIGNS: BP (!) 89/65    Pulse (!) 131   Temp (!) 104.1 F (40.1 C) (Axillary)   Resp (!) 27   Ht 6' (1.829 m)   Wt 97.5 kg (215 lb)   SpO2 98%   BMI 29.16 kg/m   HEMODYNAMICS:  VENTILATOR SETTINGS:  INTAKE / OUTPUT: No intake/output data recorded.  PHYSICAL EXAMINATION: General:  Elderly male resting in bed in some distress Neuro:  Tremor. Arouses to verbal, no verbal response.  HEENT:  [place]/AT, PERRL, no JVD. Neck supple Cardiovascular:  RRR, no MRG Lungs:  Clear Abdomen:  Soft, non-tender, non-distended Musculoskeletal:  No acute deformity, chronic mild contractures of bilateral lower extremities.  Skin:  Grossly intact  LABS:  BMET LastLabs     Recent Labs  Lab 08/14/17 0121  NA 132*  K 2.8*  CL 96*  CO2 22  BUN 11  CREATININE 0.82  GLUCOSE 154*      Electrolytes LastLabs     Recent Labs  Lab 08/14/17 0121  CALCIUM 8.9      CBC LastLabs     Recent Labs  Lab 08/14/17 0121  WBC 5.0  HGB 14.6  HCT 42.9  PLT  119*      Coag's LastLabs      Recent Labs  Lab 08/14/17 0121 08/14/17 0152  APTT  --  29  INR 1.13  --       Sepsis Markers LastLabs      Recent Labs  Lab 08/14/17 0137 08/14/17 0340  LATICACIDVEN 2.62* 4.77*      ABG LastLabs  No results for input(s): PHART, PCO2ART, PO2ART in the last 168 hours.    Liver Enzymes LastLabs     Recent Labs  Lab 08/14/17 0121  AST 63*  ALT 42  ALKPHOS 81  BILITOT 1.5*  ALBUMIN 3.7      Cardiac Enzymes LastLabs  No results for input(s): TROPONINI, PROBNP in the last 168 hours.    Glucose LastLabs  No results for input(s): GLUCAP in the last 168 hours.    Imaging Ct Head Wo Contrast  Result Date: 08/14/2017 CLINICAL DATA:  Altered mental status and fever EXAM: CT HEAD WITHOUT CONTRAST TECHNIQUE: Contiguous axial images were obtained from the base of the skull through the vertex without intravenous contrast. COMPARISON:  Head CT April 25, 2006;  brain MRI April 27, 2006 FINDINGS: Brain: The ventricles are normal in size and configuration. There is no appreciable intracranial mass, hemorrhage, extra-axial fluid collection, or midline shift. There is an age uncertain infarct in the anterior limb of the left internal capsule. Elsewhere gray-white compartments appear normal. Vascular: No hyperdense vessel. There is calcification in each carotid siphon region. Skull: Bony calvarium appears intact. Sinuses/Orbits: There is extensive opacification of most of the right frontal sinus. There is opacification of multiple ethmoid air cells bilaterally. There is opacification of much of the right maxillary antrum with an air-fluid level on the right. There is moderate mucosal thickening in the left maxillary antrum. There is air-fluid level in the right sphenoid sinus. Orbits appear symmetric bilaterally. Other: Mastoid air cells are clear. IMPRESSION: 1. Age uncertain and possibly recent infarct involving the anterior limb of the left internal capsule. Elsewhere gray-white compartments appear normal. No mass or hemorrhage. 2. Multifocal paranasal sinus disease with air-fluid levels in the right sphenoid and right maxillary sinuses. 3.  Foci of arterial vascular calcification noted. Electronically Signed   By: Bretta Bang III M.D.   On: 08/14/2017 02:53   Dg Chest Port 1 View  Result Date: 08/14/2017 CLINICAL DATA:  Altered mental status. EXAM: PORTABLE CHEST 1 VIEW COMPARISON:  Most recent radiographs 02/10/2017. Most recent CT 02/23/2016 FINDINGS: Post median sternotomy and CABG. Mild cardiomegaly. Unchanged mediastinal contours. Emphysema. Bleb in the left upper lobe. Scarring in the periphery of the right upper lobe. No consolidation, pleural fluid or pneumothorax. No pulmonary edema. IMPRESSION: 1. No acute abnormality. 2. Emphysema with multifocal scarring. 3. Stable mild cardiomegaly. Electronically Signed   By: Rubye Oaks M.D.   On:  08/14/2017 01:48     STUDIES:  CT head 12/20 > Age uncertain and possibly recent infarct involving the anterior limb of the left internal capsule. Elsewhere gray-white compartments appear normal. No mass or hemorrhage. Multifocal paranasal sinus disease with air-fluid levels in the right sphenoid and right maxillary sinuses.  CULTURES: Blood 12/20 > Urine 12/20 > CSF 12/20 > Flu PCR 12/20 > NEG  ANTIBIOTICS: Augmentin 12/17 >12/20 Ampicillin 12/20 > Vancomycin 12/20 >  Cefepime 12/20 > Azithromycin 12/20 > Acyclovir 12/20 >  SIGNIFICANT EVENTS: 12/20 admit  LINES/TUBES:   DISCUSSION: 66 year old male with COPD and RA on pred and methotrexate presenting with  headache, weakness, and high fevers. Unclear origin. Sinusitis on CT. LP results pending. Covering meningitis. At risk intubation.    ASSESSMENT / PLAN:  PULMONARY A: Tachypnea compensation for lactic acidosis At risk airway compromise Chronic hypoxemic respiratory failure (3L at baseline) COPD without acute exacerbation. Bronchiectasis  P:   At risk for intubation, monitor closely. Scheduled nebs  CARDIOVASCULAR A:  Septic shock Atrial fibrillation with RVR (with history of flutter s/p ablation)  P:  ICU hemodynamic montoring S/p 3 Liters IVF resuscitation. Would give at least 1 more if BP doesn't hold. Continue to trend lactic until clearing Control fever to hopefull slow HR, if not consider amio.   RENAL A:   Hypokalemia Hyponatremia  P:   Supp K Repeat BMP  GASTROINTESTINAL A:   Diarrhea   P:   NPO C diff eval  HEMATOLOGIC A:   Mild thrombocytopenia  P:  SQ heparin Follow CBC  INFECTIOUS A:   Hyperpyrexia of unknown origin: Concern for meningitis with headache, sinusitis, and high fevers. R/o CAP as well.   Immunocompromised host  P:   Follow cultures LP results pending ABX as above (amp, cefepime, vanco, azithro) want to cover pseudomonas due to  bronchiectasis.  Tylenol PRN for fevers Add acyclovir   AUTOIMMUNE A:   RA - followed at [facility]  P:   Holding methotrexate until able to take PO Holding prednisone in favor of stress dose hydrocort.  ENDOCRINE A:   Relative AI  P:   Stress dose steroids.   NEUROLOGIC A:   Acute metabolic encephalopathy  P:   Monitor closely. Hopefull should improve with fever.    FAMILY  - Updates: Wife updated  - Inter-disciplinary family meet or Palliative Care meeting due by:  12/27    Joneen Roach, AGACNP-BC [place] Pager [phone] or [phone]  08/14/2017 5:43 AM

## 2017-08-15 NOTE — Progress Notes (Signed)
CRITICAL VALUE ALERT  Critical Value:  Troponin 1.2   Date & Time Notied:  12/21 3:30  Provider Notified: E-link  Orders Received/Actions taken: Not given

## 2017-08-15 NOTE — Progress Notes (Signed)
  Echocardiogram 2D Echocardiogram has been performed.  Joseph [name] 08/15/2017, 10:28 AM

## 2017-08-15 NOTE — Care Management Note (Signed)
Case Management Note  Patient Details  Name: Joseph [name] MRN: [id number] Date of Birth: 1951/06/29  Subjective/Objective:  Pt admitted with AMS                 Action/Plan:   PTA independent from home with wife.  Pt is on supplemental oxygen at home -now requiring ventilator and pressors.  CM will continue to follow for discharge needs   Expected Discharge Date:                  Expected Discharge Plan:  Home/Self Care  In-House Referral:     Discharge planning Services     Post Acute Care Choice:    Choice offered to:     DME Arranged:    DME Agency:     HH Arranged:    HH Agency:     Status of Service:     If discussed at [employer] of Stay Meetings, dates discussed:    Additional Comments:  Cherylann Parr, RN 08/15/2017, 11:49 AM

## 2017-08-15 NOTE — Progress Notes (Signed)
Initial Nutrition Assessment  DOCUMENTATION CODES:   Obesity unspecified  INTERVENTION:    Vital High Protein at 35 ml/h (840 ml per day)  Pro-stat 60 ml TID  Provides 1440 kcal, 164 gm protein, 702 ml free water daily  NUTRITION DIAGNOSIS:   Inadequate oral intake related to inability to eat as evidenced by NPO status.  GOAL:   Provide needs based on ASPEN/SCCM guidelines  MONITOR:   Vent status, TF tolerance, Labs, I & O's  REASON FOR ASSESSMENT:   Consult Enteral/tube feeding initiation and management  ASSESSMENT:   66 yo male with PMH of COPD and RA who was admitted on 12/20 with septic shock, A fib with RVR, requiring intubation on admission.  Discussed patient in ICU rounds and with RN today. RN reports minimal emesis 30 ml this morning. Received MD Consult for TF initiation and management. Patient is currently intubated on ventilator support MV: 14.9 L/min Temp (24hrs), Avg:98.4 F (36.9 C), Min:97.9 F (36.6 C), Max:98.9 F (37.2 C)   Labs reviewed. Potassium 2.9 (L), phosphorus 2.4 (L), magnesium 1.3 (L)  Medications reviewed and include potassium phosphate, bicarb.  NUTRITION - FOCUSED PHYSICAL EXAM:    Most Recent Value  Orbital Region  No depletion  Upper Arm Region  No depletion  Thoracic and Lumbar Region  Unable to assess  Buccal Region  No depletion  Temple Region  No depletion  Clavicle Bone Region  No depletion  Clavicle and Acromion Bone Region  No depletion  Scapular Bone Region  Unable to assess  Dorsal Hand  Unable to assess  Patellar Region  No depletion  Anterior Thigh Region  No depletion  Posterior Calf Region  No depletion  Edema (RD Assessment)  None  Hair  Reviewed  Eyes  Unable to assess  Mouth  Unable to assess  Skin  Reviewed  Nails  Unable to assess       Diet Order:  Diet NPO time specified  EDUCATION NEEDS:   No education needs have been identified at this time  Skin:  Skin Assessment: Reviewed RN  Assessment  Last BM:  12/21  Height:   Ht Readings from Last 1 Encounters:  08/14/17 6' (1.829 m)    Weight:   Wt Readings from Last 1 Encounters:  08/15/17 225 lb 5 oz (102.2 kg)    Ideal Body Weight:  80.9 kg  BMI:  Body mass index is 30.56 kg/m.  Estimated Nutritional Needs:   Kcal:  3664-4034  Protein:  162 gm  Fluid:  2 L   Joaquin Courts, RD, LDN, CNSC Pager [phone] After Hours Pager [phone]

## 2017-08-15 NOTE — Progress Notes (Signed)
Pt wrtiing notes and family is asking extensive questions about ventilator concepts and how the weaning process works. RRT gave brief verbal explanation in the regard ventilator concepts. Family verbalize understanding. Pt is stable at this time. SATs are stable

## 2017-08-15 NOTE — Progress Notes (Signed)
PULMONARY / CRITICAL CARE MEDICINE   Name: Joseph [name] MRN: [id number] DOB: 01/18/1951    ADMISSION DATE:  08/14/2017 CONSULTATION DATE:  08/14/2017  REFERRING MD: Dr. Bebe ShaggyWickline  CHIEF COMPLAINT: Fever  HISTORY OF PRESENT ILLNESS:   66 year old male with past medical history as below, which is significant for rheumatoid arthritis (on daily prednisone and methotrexate), COPD and bronchiectasis on 3 L oxygen, diabetes, GERD, recurrent pneumonia secondary to chronic aspiration/reflux, and coronary artery disease.  He has a several day history of what he considered to be a sinus infection that he picked up from his wife.  He presented to pulmonary clinic for this and was written a prescription for Augmentin.  His symptoms progressed and he developed high fevers, headache, malaise.  He presented to the emergency department on 12/20 with these complaints.  Symptoms were felt to be infectious in origin and potentially meningitis.  He had LP done in the emergency department and was started on broad-spectrum antibiotic.  He was hypotensive despite IV fluid bolus and PCCM was asked to admit.    SUBJECTIVE:  66 year old male who required intubation and vasopressor support and stress dose steroids on 08/14/2017 along with signs and symptoms of sepsis.  He is currently on pressure support he required intubation.  Stable while on pressors.  No intentions of extubated today.  VITAL SIGNS: BP 129/68   Pulse (!) 101   Temp 98 F (36.7 C) (Axillary)   Resp 11   Ht 6' (1.829 m)   Wt 102.2 kg (225 lb 5 oz)   SpO2 99%   BMI 30.56 kg/m   HEMODYNAMICS: CVP:  [0 mmHg-13 mmHg] 9 mmHg  VENTILATOR SETTINGS: Vent Mode: PSV;CPAP FiO2 (%):  [40 %-100 %] 40 % Set Rate:  [18 bmp-24 bmp] 24 bmp Vt Set:  [086[620 mL] 620 mL PEEP:  [5 cmH20] 5 cmH20 Pressure Support:  [5 cmH20] 5 cmH20 Plateau Pressure:  [15 cmH20-17 cmH20] 15 cmH20  INTAKE / OUTPUT: I/O last 3 completed shifts: In: 11661.7 [I.V.:3891.7;  NG/GT:1100; IV Piggyback:6670] Out: 2290 [Urine:2290]  PHYSICAL EXAMINATION: General: Well-nourished well-developed male currently on full mechanical dilatory support but awake alert HEENT: Endotracheal tube to ventilator orogastric tube to low wall intermittent suction.  Note nutritional consult called for tube feedings. PSY: Normal effect Neuro: Intact CV: s1s2 rrr, no m/r/g PULM: Rhonchi bilaterally with decreased breath sounds at VH:QIONGI:soft, non-tender, bsx4 active  Extremities: warm/dry, 1+ edema lower extremities   Skin: no rashes or lesions    LABS:  BMET Recent Labs  Lab 08/14/17 0902 08/14/17 1337 08/15/17 0517  NA 136 135 136  K 2.2* 3.7 2.9*  CL 107 110 105  CO2 18* 11* 22  BUN 14 16 12   CREATININE 1.33* 1.17 0.81  GLUCOSE 116* 171* 387*    Electrolytes Recent Labs  Lab 08/14/17 0902 08/14/17 1337 08/15/17 0517  CALCIUM 7.5* 7.0* 7.0*  MG  --   --  1.3*  PHOS  --   --  2.4*    CBC Recent Labs  Lab 08/14/17 0902 08/14/17 1700 08/15/17 0517  WBC 5.4 8.2 9.1  HGB 11.9* 13.1 11.9*  HCT 35.4* 39.2 34.7*  PLT 97* 96* 93*    Coag's Recent Labs  Lab 08/14/17 0121 08/14/17 0152  APTT  --  29  INR 1.13  --     Sepsis Markers Recent Labs  Lab 08/14/17 0340 08/14/17 0804 08/14/17 0906 08/14/17 1602 08/15/17 0517  LATICACIDVEN 4.77*  --  3.0* 4.3*  --  PROCALCITON  --  22.20  --   --  17.92    ABG Recent Labs  Lab 08/14/17 1615 08/15/17 0624  PHART 7.144* 7.454*  PCO2ART 48.1* 33.1  PO2ART 269.0* 150*    Liver Enzymes Recent Labs  Lab 08/14/17 0121  AST 63*  ALT 42  ALKPHOS 81  BILITOT 1.5*  ALBUMIN 3.7    Cardiac Enzymes Recent Labs  Lab 08/14/17 1337 08/14/17 1907 08/15/17 0151  TROPONINI 0.67* 0.96* 1.20*    Glucose Recent Labs  Lab 08/14/17 0837  GLUCAP 100*    Imaging Ct Abdomen Pelvis Wo Contrast  Result Date: 08/14/2017 CLINICAL DATA:  Acute respiratory illness with sepsis EXAM: CT CHEST, ABDOMEN  AND PELVIS WITHOUT CONTRAST TECHNIQUE: Multidetector CT imaging of the chest, abdomen and pelvis was performed following the standard protocol without IV contrast. COMPARISON:  08/14/2017, 10/11/2009 FINDINGS: CT CHEST FINDINGS Cardiovascular: Limited without intravenous contrast. Moderate-to-marked atherosclerotic calcification of the aorta. No aneurysmal dilatation. Post CABG changes. Coronary artery calcification. Heart size upper normal. No pericardial effusion. Mediastinum/Nodes: Endotracheal tube tip terminate just above the carina. Esophageal tube tip is within the gastric outlet. No thyroid mass. Nonspecific subcentimeter mediastinal lymph nodes. Lungs/Pleura: Trace pleural effusion. Atelectasis within the posterior lung bases. Moderate to marked emphysema with large bulla in the upper lobes. Negative for a pneumothorax. Musculoskeletal: Postsurgical changes of the sternum. Degenerative changes of the spine. No acute or suspicious lesion CT ABDOMEN PELVIS FINDINGS Hepatobiliary: Surgical clips in the gallbladder fossa. No focal hepatic abnormality or biliary dilatation. Suspected small stones in the distal common bile duct, measuring up to 7 mm. Pancreas: Unremarkable. No pancreatic ductal dilatation or surrounding inflammatory changes. Spleen: Normal in size without focal abnormality. Adrenals/Urinary Tract: Adrenal glands are within normal limits. No hydronephrosis. Nonspecific perinephric fat stranding. Multiple right greater than left intrarenal calculi, largest stone on the right is seen in the upper pole and measures 6 mm. Largest stone on the left is seen in the upper pole and measures 5 mm. Foley catheter and air in the empty bladder. Stomach/Bowel: Stomach is within normal limits. Appendix appears normal. No evidence of bowel wall thickening, distention, or inflammatory changes. Vascular/Lymphatic: Moderate to marked atherosclerotic vascular disease. No aneurysmal dilatation. No significant  abdominal or pelvic adenopathy Reproductive: Prostate is unremarkable. Other: Small fat in the inguinal canals. No free air or free fluid. Small fat in the umbilicus. Musculoskeletal: Degenerative changes. No acute or suspicious abnormality IMPRESSION: 1. Support lines and tubes as above. Tip of the endotracheal tube terminates just above the carina. 2. Trace pleural effusions and atelectasis in the bilateral lung bases. Moderate severe emphysema 3. No CT evidence for acute intra-abdominal or pelvic abnormality 4. Status post cholecystectomy. Suspect that there is choledocholithiasis. No significant biliary dilatation. 5. Multiple bilateral nonobstructing stones within the kidneys. Electronically Signed   By: Jasmine Pang M.D.   On: 08/14/2017 20:59   Ct Chest Wo Contrast  Result Date: 08/14/2017 CLINICAL DATA:  Acute respiratory illness with sepsis EXAM: CT CHEST, ABDOMEN AND PELVIS WITHOUT CONTRAST TECHNIQUE: Multidetector CT imaging of the chest, abdomen and pelvis was performed following the standard protocol without IV contrast. COMPARISON:  08/14/2017, 10/11/2009 FINDINGS: CT CHEST FINDINGS Cardiovascular: Limited without intravenous contrast. Moderate-to-marked atherosclerotic calcification of the aorta. No aneurysmal dilatation. Post CABG changes. Coronary artery calcification. Heart size upper normal. No pericardial effusion. Mediastinum/Nodes: Endotracheal tube tip terminate just above the carina. Esophageal tube tip is within the gastric outlet. No thyroid mass. Nonspecific subcentimeter mediastinal lymph  nodes. Lungs/Pleura: Trace pleural effusion. Atelectasis within the posterior lung bases. Moderate to marked emphysema with large bulla in the upper lobes. Negative for a pneumothorax. Musculoskeletal: Postsurgical changes of the sternum. Degenerative changes of the spine. No acute or suspicious lesion CT ABDOMEN PELVIS FINDINGS Hepatobiliary: Surgical clips in the gallbladder fossa. No focal  hepatic abnormality or biliary dilatation. Suspected small stones in the distal common bile duct, measuring up to 7 mm. Pancreas: Unremarkable. No pancreatic ductal dilatation or surrounding inflammatory changes. Spleen: Normal in size without focal abnormality. Adrenals/Urinary Tract: Adrenal glands are within normal limits. No hydronephrosis. Nonspecific perinephric fat stranding. Multiple right greater than left intrarenal calculi, largest stone on the right is seen in the upper pole and measures 6 mm. Largest stone on the left is seen in the upper pole and measures 5 mm. Foley catheter and air in the empty bladder. Stomach/Bowel: Stomach is within normal limits. Appendix appears normal. No evidence of bowel wall thickening, distention, or inflammatory changes. Vascular/Lymphatic: Moderate to marked atherosclerotic vascular disease. No aneurysmal dilatation. No significant abdominal or pelvic adenopathy Reproductive: Prostate is unremarkable. Other: Small fat in the inguinal canals. No free air or free fluid. Small fat in the umbilicus. Musculoskeletal: Degenerative changes. No acute or suspicious abnormality IMPRESSION: 1. Support lines and tubes as above. Tip of the endotracheal tube terminates just above the carina. 2. Trace pleural effusions and atelectasis in the bilateral lung bases. Moderate severe emphysema 3. No CT evidence for acute intra-abdominal or pelvic abnormality 4. Status post cholecystectomy. Suspect that there is choledocholithiasis. No significant biliary dilatation. 5. Multiple bilateral nonobstructing stones within the kidneys. Electronically Signed   By: Jasmine Pang M.D.   On: 08/14/2017 20:59   Dg Chest Port 1 View  Result Date: 08/15/2017 CLINICAL DATA:  Respiratory failure EXAM: PORTABLE CHEST 1 VIEW COMPARISON:  Chest x-rays dated 08/14/2017 and 08/13/2017. Comparison chest x-ray dated 03/07/2016. FINDINGS: Endotracheal tube is well positioned with tip just above the level of  the carina. Left IJ central line is stable in position with tip at the level of the upper/mid SVC. Enteric tube passes below the diaphragm. Cardiomediastinal silhouette is stable. Atherosclerotic changes noted at the aortic arch. Median sternotomy wires appear intact and stable in alignment. Improved aeration at each lung base compared to yesterday's study suggesting improved fluid status. Coarse lung markings persist at each lung base, stable compared to the older study of 03/07/2016 indicating chronic interstitial lung disease/fibrosis. IMPRESSION: 1. Improved aeration at each lung base compared to yesterday's study suggesting improved fluid status and/or resolved atelectasis. No new lung findings. 2. Coarse lung markings at each lung base, stable compared to the older study of 03/07/2016 indicating some degree of chronic interstitial lung disease/fibrosis. 3. Support apparatus is stable in position. 4. Aortic atherosclerosis. Electronically Signed   By: Bary Richard M.D.   On: 08/15/2017 07:14   Dg Chest Port 1 View  Result Date: 08/14/2017 CLINICAL DATA:  Intubated EXAM: PORTABLE CHEST 1 VIEW COMPARISON:  08/14/2017, 02/10/2017 FINDINGS: Left-sided central venous catheter overlies the SVC origin. Interval intubation, tip of the endotracheal tube is about 3.3 cm superior to the carina. Esophageal tube extends below diaphragm but the tip is non included. Post sternotomy changes. Worsening interstitial and alveolar opacity in the bilateral lung bases. Emphysematous disease and scarring. Stable cardiomediastinal silhouette with atherosclerosis. No pneumothorax. IMPRESSION: 1. Endotracheal tube tip about 3.3 cm superior to carina. Esophageal tube extends below diaphragm but the tip is non included 2. Worsening interstitial  and alveolar opacity at the bilateral lung bases which may reflect pulmonary edema or pneumonia 3. Emphysema Electronically Signed   By: Jasmine Pang M.D.   On: 08/14/2017 15:51   Dg Abd  Portable 1v  Result Date: 08/14/2017 CLINICAL DATA:  65 year old male enteric tube placement. EXAM: PORTABLE ABDOMEN - 1 VIEW COMPARISON:  CT Abdomen and Pelvis 10/11/2009. Portable chest radiographs today and earlier. FINDINGS: Portable AP semi upright view at 1538 hours. Enteric tube courses to the stomach. The side holes at the low level of the distal gastric body. There is a paucity of other upper abdominal bowel gas. Cholecystectomy clips are new since 2011. Stable visible chest. IMPRESSION: Enteric tube placed into the stomach, side hole up the level of the distal gastric body. Electronically Signed   By: Odessa Fleming M.D.   On: 08/14/2017 15:57     STUDIES:  CT head 12/20 > Age uncertain and possibly recent infarct involving the anterior limb of the left internal capsule. Elsewhere gray-white compartments appear normal. No mass or hemorrhage. Multifocal paranasal sinus disease with air-fluid levels in the right sphenoid and right maxillary sinuses. 08/15/2017 CT of the chest and abdomen essentially unremarkable.  CULTURES: Blood 12/20 > Urine 12/20 > CSF 12/20 > Flu PCR 12/20 > NEG 08/14/2017 sputum culture ordered but not done as of 08/15/2017>>  ANTIBIOTICS: Augmentin 12/17 >12/20 Ampicillin 12/20 > 08/15/2017 Vancomycin 12/20 >  Cefepime 12/20 > Azithromycin 12/20 > 08/15/2017 Acyclovir 12/20 > 08/15/2017 SIGNIFICANT EVENTS: 12/20 admit 08/14/2017 intubated  LINES/TUBES:   DISCUSSION: 66 year old male with COPD and RA on pred and methotrexate presenting with headache, weakness, and high fevers. Unclear origin. Sinusitis on CT. LP results pending. Covering meningitis. At risk intubation.  08/14/2017  remains hypotensive despite fluid resuscitation.  We will start pressure support possible central line insertion and arterial line insertion.  ASSESSMENT / PLAN:  PULMONARY A: Tachypnea compensation for lactic acidosis At risk airway compromise Chronic hypoxemic respiratory  failure (3L at baseline) COPD without acute exacerbation. Bronchiectasis  P:   Intubated 08/14/2017 noted to have underlying bronchiectasis and worsening chest x-ray increasing respiratory failure and failed noninvasive mechanical ventilatory support. 08/15/2017 he is weaning well but would not extubate at this time since he just got intubated on 08/14/2017 and has severe O2 dependent underlying bronchiectasis.  Would correct all his metabolic disarray and vasopressor dependent hypotension prior to attempting extubation. Scheduled nebs Solu-Cortef a place of prednisone  CARDIOVASCULAR A:  Septic shock Atrial fibrillation with RVR (with history of flutter s/p ablation) currently sinus tach rate of 128  P:  ICU hemodynamic montoring Required vasopressor support 08/14/2017 with Levophed and vasopressin Intubated Placed on bicarbonate drip for metabolic acidosis Central line placed to monitor CVP and administration of vasopressors  RENAL Lab Results  Component Value Date   CREATININE 0.81 08/15/2017   CREATININE 1.17 08/14/2017   CREATININE 1.33 (H) 08/14/2017   CREATININE 0.74 10/14/2016   Recent Labs  Lab 08/14/17 0902 08/14/17 1337 08/15/17 0517  K 2.2* 3.7 2.9*    A:   Hypokalemia Hyponatremia Hypomagnesia Hypocalcemia Metabolic acidosis  P:   Supp K Replete magnesium Replete potassium Replete phosphorus Calcium replete Decrease bicarb drip to 75 cc an hour Repeat BMP  GASTROINTESTINAL A:   Diarrhea   P:   NPO C. difficile negative  HEMATOLOGIC A:   Mild thrombocytopenia  P:  SQ heparin Follow CBC Trend platelets Platelets continue to decrease will need to stop anticoagulation  INFECTIOUS A:  Hyperpyrexia of unknown origin: Concern for meningitis with headache, sinusitis, and high fevers. R/o CAP as well.   Immunocompromised host  P:   Follow cultures LP results pending, 1221 cerebrospinal fluid with no growth present final results  pending ABX as above (amp, cefepime, vanco, azithro) want to cover pseudomonas due to bronchiectasis.  Tylenol PRN for fevers Add acyclovir  08/14/2017 check pro-calcitonin protocol.  17.92 on 08/16/2007 We will discontinue Zithromax and acyclovir and penicillin on 08/15/2017  AUTOIMMUNE A:   RA - followed at [facility]  P:   Holding methotrexate until able to take PO Holding prednisone in favor of stress dose hydrocort.  ENDOCRINE CBG (last 3)  Recent Labs    08/14/17 0837  GLUCAP 100*    A:   Relative AI Elevated glucose P:   Stress dose steroids.  Started 08/14/2017 Solu-Cortef given stat at 0 812 22,018 Start sliding scale insulin every 4 hours  NEUROLOGIC A:   Acute metabolic encephalopathy  P:   Monitor closely. [place] should improve with fever.   08/14/2017 more responsive. 08/15/2017 when not sedated is awake alert follows commands   FAMILY  - Updates: Wife and patient updated at bedside 08/15/2017  - Inter-disciplinary family meet or Palliative Care meeting due by:  12/27   -App CCT 30 min   Brett Canales Minor ACNP Adolph Pollack PCCM Pager [phone] till 1 pm If no answer page [phone] 08/15/2017, 10:20 AM

## 2017-08-16 ENCOUNTER — Inpatient Hospital Stay (HOSPITAL_COMMUNITY): Payer: Medicare HMO

## 2017-08-16 ENCOUNTER — Encounter (HOSPITAL_COMMUNITY): Payer: Self-pay | Admitting: *Deleted

## 2017-08-16 LAB — BLOOD GAS, ARTERIAL
ACID-BASE EXCESS: 5 mmol/L — AB (ref 0.0–2.0)
Bicarbonate: 28 mmol/L (ref 20.0–28.0)
DRAWN BY: 44166
FIO2: 40
MECHVT: 620 mL
O2 SAT: 99.5 %
PEEP/CPAP: 5 cmH2O
PH ART: 7.52 — AB (ref 7.350–7.450)
Patient temperature: 98.6
RATE: 24 resp/min
pCO2 arterial: 34.5 mmHg (ref 32.0–48.0)
pO2, Arterial: 151 mmHg — ABNORMAL HIGH (ref 83.0–108.0)

## 2017-08-16 LAB — CBC WITH DIFFERENTIAL/PLATELET
Basophils Absolute: 0 10*3/uL (ref 0.0–0.1)
Basophils Relative: 0 %
Eosinophils Absolute: 0 10*3/uL (ref 0.0–0.7)
Eosinophils Relative: 0 %
HEMATOCRIT: 33.1 % — AB (ref 39.0–52.0)
HEMOGLOBIN: 11.1 g/dL — AB (ref 13.0–17.0)
LYMPHS ABS: 0.6 10*3/uL — AB (ref 0.7–4.0)
Lymphocytes Relative: 11 %
MCH: 29.4 pg (ref 26.0–34.0)
MCHC: 33.5 g/dL (ref 30.0–36.0)
MCV: 87.6 fL (ref 78.0–100.0)
MONOS PCT: 12 %
Monocytes Absolute: 0.6 10*3/uL (ref 0.1–1.0)
NEUTROS ABS: 4.1 10*3/uL (ref 1.7–7.7)
NEUTROS PCT: 77 %
Platelets: 70 10*3/uL — ABNORMAL LOW (ref 150–400)
RBC: 3.78 MIL/uL — ABNORMAL LOW (ref 4.22–5.81)
RDW: 16.1 % — ABNORMAL HIGH (ref 11.5–15.5)
WBC: 5.4 10*3/uL (ref 4.0–10.5)

## 2017-08-16 LAB — GLUCOSE, CAPILLARY
GLUCOSE-CAPILLARY: 248 mg/dL — AB (ref 65–99)
GLUCOSE-CAPILLARY: 272 mg/dL — AB (ref 65–99)
Glucose-Capillary: 214 mg/dL — ABNORMAL HIGH (ref 65–99)
Glucose-Capillary: 220 mg/dL — ABNORMAL HIGH (ref 65–99)
Glucose-Capillary: 267 mg/dL — ABNORMAL HIGH (ref 65–99)
Glucose-Capillary: 271 mg/dL — ABNORMAL HIGH (ref 65–99)

## 2017-08-16 LAB — BASIC METABOLIC PANEL
Anion gap: 10 (ref 5–15)
Anion gap: 11 (ref 5–15)
BUN: 12 mg/dL (ref 6–20)
BUN: 12 mg/dL (ref 6–20)
CALCIUM: 7.5 mg/dL — AB (ref 8.9–10.3)
CHLORIDE: 104 mmol/L (ref 101–111)
CO2: 27 mmol/L (ref 22–32)
CO2: 28 mmol/L (ref 22–32)
CREATININE: 0.62 mg/dL (ref 0.61–1.24)
CREATININE: 0.63 mg/dL (ref 0.61–1.24)
Calcium: 7.5 mg/dL — ABNORMAL LOW (ref 8.9–10.3)
Chloride: 100 mmol/L — ABNORMAL LOW (ref 101–111)
GFR calc Af Amer: >60 mL/min (ref >60)
GFR calc non Af Amer: >60 mL/min (ref >60)
GLUCOSE: 267 mg/dL — AB (ref 65–99)
Glucose, Bld: 242 mg/dL — ABNORMAL HIGH (ref 65–99)
Potassium: 2.1 mmol/L — CL (ref 3.5–5.1)
Potassium: 2.9 mmol/L — ABNORMAL LOW (ref 3.5–5.1)
SODIUM: 139 mmol/L (ref 135–145)
Sodium: 141 mmol/L (ref 135–145)

## 2017-08-16 LAB — PHOSPHORUS
PHOSPHORUS: 1.4 mg/dL — AB (ref 2.5–4.6)
PHOSPHORUS: 1.5 mg/dL — AB (ref 2.5–4.6)

## 2017-08-16 LAB — MAGNESIUM
MAGNESIUM: 1.8 mg/dL (ref 1.7–2.4)
Magnesium: 2 mg/dL (ref 1.7–2.4)

## 2017-08-16 LAB — PROCALCITONIN: Procalcitonin: 12.62 ng/mL

## 2017-08-16 MED ORDER — POTASSIUM CHLORIDE CRYS ER 20 MEQ PO TBCR
40.0000 meq | EXTENDED_RELEASE_TABLET | Freq: Once | ORAL | Status: AC
  Administered 2017-08-16: 40 meq via ORAL
  Filled 2017-08-16: qty 2

## 2017-08-16 MED ORDER — POTASSIUM CHLORIDE 10 MEQ/100ML IV SOLN
10.0000 meq | INTRAVENOUS | Status: AC
Start: 2017-08-16 — End: 2017-08-16
  Administered 2017-08-16 (×4): 10 meq via INTRAVENOUS
  Filled 2017-08-16 (×5): qty 100

## 2017-08-16 MED ORDER — FUROSEMIDE 10 MG/ML IJ SOLN
40.0000 mg | Freq: Two times a day (BID) | INTRAMUSCULAR | Status: DC
  Administered 2017-08-16 – 2017-08-19 (×6): 40 mg via INTRAVENOUS
  Filled 2017-08-16 (×7): qty 4

## 2017-08-16 MED ORDER — POTASSIUM CHLORIDE 10 MEQ/50ML IV SOLN
10.0000 meq | INTRAVENOUS | Status: AC
  Administered 2017-08-16 (×4): 10 meq via INTRAVENOUS
  Filled 2017-08-16 (×5): qty 50

## 2017-08-16 MED ORDER — DEXTROSE 5 % IV SOLN
40.0000 meq | Freq: Once | INTRAVENOUS | Status: AC
  Administered 2017-08-16: 40 meq via INTRAVENOUS
  Filled 2017-08-16: qty 9.09

## 2017-08-16 MED ORDER — MAGNESIUM SULFATE 2 GM/50ML IV SOLN
2.0000 g | Freq: Once | INTRAVENOUS | Status: AC
  Administered 2017-08-16: 2 g via INTRAVENOUS
  Filled 2017-08-16: qty 50

## 2017-08-16 MED ORDER — INSULIN GLARGINE 100 UNIT/ML ~~LOC~~ SOLN
5.0000 [IU] | Freq: Every day | SUBCUTANEOUS | Status: DC
  Administered 2017-08-16 – 2017-08-17 (×2): 5 [IU] via SUBCUTANEOUS
  Filled 2017-08-16 (×2): qty 0.05

## 2017-08-16 MED ORDER — POTASSIUM PHOSPHATES 15 MMOLE/5ML IV SOLN
30.0000 mmol | Freq: Once | INTRAVENOUS | Status: AC
  Administered 2017-08-16: 30 mmol via INTRAVENOUS
  Filled 2017-08-16: qty 10

## 2017-08-16 MED ORDER — INSULIN ASPART 100 UNIT/ML ~~LOC~~ SOLN
0.0000 [IU] | SUBCUTANEOUS | Status: DC
  Administered 2017-08-16: 7 [IU] via SUBCUTANEOUS
  Administered 2017-08-16: 11 [IU] via SUBCUTANEOUS
  Administered 2017-08-17 (×4): 7 [IU] via SUBCUTANEOUS

## 2017-08-16 MED ORDER — FUROSEMIDE 10 MG/ML IJ SOLN
40.0000 mg | Freq: Once | INTRAMUSCULAR | Status: AC
  Administered 2017-08-16: 40 mg via INTRAVENOUS
  Filled 2017-08-16: qty 4

## 2017-08-16 NOTE — Procedures (Signed)
Extubation Procedure Note  Patient Details:   Name: Joseph [name] DOB: Feb 02, 1951 MRN: [id number]   Airway Documentation:     Evaluation  O2 sats: stable throughout Complications: No apparent complications Patient did tolerate procedure well. Bilateral Breath Sounds: Clear, Diminished   Yes   Pt extubated to 4L [place] per MD order, wears 3L at home 24/7. Pt has a strong productive cough and is using [place] to clear secretions. Pt able to speak with no complications. VS within normal limits. RT will continue to monitor.   Carolan Shiver 08/16/2017, 11:47 AM

## 2017-08-16 NOTE — Progress Notes (Signed)
PULMONARY / CRITICAL CARE MEDICINE   Name: Joseph [name] MRN: [id number] DOB: 11/21/1950    ADMISSION DATE:  08/14/2017 CONSULTATION DATE:  08/14/2017  REFERRING MD: Dr. Bebe Shaggy  CHIEF COMPLAINT: Fever  HISTORY OF PRESENT ILLNESS:   66 year old male with past medical history as below, which is significant for rheumatoid arthritis (on daily prednisone and methotrexate), COPD and bronchiectasis on 3 L oxygen, diabetes, GERD, recurrent pneumonia secondary to chronic aspiration/reflux, and coronary artery disease.  He has a several day history of what he considered to be a sinus infection that he picked up from his wife.  He presented to pulmonary clinic for this and was written a prescription for Augmentin.  His symptoms progressed and he developed high fevers, headache, malaise.  He presented to the emergency department on 12/20 with these complaints.  Symptoms were felt to be infectious in origin and potentially meningitis.  He had LP done in the emergency department and was started on broad-spectrum antibiotic.  He was hypotensive despite IV fluid bolus and PCCM was asked to admit.    SUBJECTIVE:  Awake and alert, writing on tablet . Weaning this am 5/5 , good volumes.  Off pressors. Remains afebrile. WBC nml.    VITAL SIGNS: BP 117/60   Pulse 92   Temp 98.3 F (36.8 C) (Axillary)   Resp (!) 28   Ht 6' (1.829 m)   Wt 231 lb 14.8 oz (105.2 kg)   SpO2 98%   BMI 31.45 kg/m   HEMODYNAMICS: CVP:  [3 mmHg-12 mmHg] 10 mmHg  VENTILATOR SETTINGS: Vent Mode: PRVC FiO2 (%):  [40 %] 40 % Set Rate:  [24 bmp] 24 bmp Vt Set:  [620 mL] 620 mL PEEP:  [5 cmH20] 5 cmH20 Pressure Support:  [5 cmH20] 5 cmH20 Plateau Pressure:  [14 cmH20-15 cmH20] 14 cmH20  INTAKE / OUTPUT: I/O last 3 completed shifts: In: 7327.8 [I.V.:4853; NG/GT:88.1; IV Piggyback:2386.7] Out: 4015 [Urine:3885; Emesis/NG output:130]  PHYSICAL EXAMINATION: General: elderly male on vent , awake and alert.  HEENT: ETT  .  PSY: Normal effect Neuro: Intact, f/c  CV: s1s2 rrr, no m/r/g PULM: Rhonchi bilaterally with decreased breath sounds at XJ:OITG, non-tender, bsx4 active  Extremities: warm/dry, 1+ edema lower extremities  , arthritic changes in hands/feet.  Skin: no rashes or lesions    LABS:  BMET Recent Labs  Lab 08/14/17 1337 08/15/17 0517 08/16/17 0511  NA 135 136 141  K 3.7 2.9* 2.1*  CL 110 105 104  CO2 11* 22 27  BUN 16 12 12   CREATININE 1.17 0.81 0.62  GLUCOSE 171* 387* 267*    Electrolytes Recent Labs  Lab 08/14/17 1337 08/15/17 0517 08/16/17 0511  CALCIUM 7.0* 7.0* 7.5*  MG  --  1.3* 2.0  PHOS  --  2.4* 1.4*    CBC Recent Labs  Lab 08/14/17 1700 08/15/17 0517 08/16/17 0511  WBC 8.2 9.1 5.4  HGB 13.1 11.9* 11.1*  HCT 39.2 34.7* 33.1*  PLT 96* 93* 70*    Coag's Recent Labs  Lab 08/14/17 0121 08/14/17 0152  APTT  --  29  INR 1.13  --     Sepsis Markers Recent Labs  Lab 08/14/17 0340 08/14/17 0804 08/14/17 0906 08/14/17 1602 08/15/17 0517 08/16/17 0511  LATICACIDVEN 4.77*  --  3.0* 4.3*  --   --   PROCALCITON  --  22.20  --   --  17.92 12.62    ABG Recent Labs  Lab 08/14/17 1615 08/15/17 0624 08/16/17 0330  PHART 7.144* 7.454* 7.520*  PCO2ART 48.1* 33.1 34.5  PO2ART 269.0* 150* 151*    Liver Enzymes Recent Labs  Lab 08/14/17 0121  AST 63*  ALT 42  ALKPHOS 81  BILITOT 1.5*  ALBUMIN 3.7    Cardiac Enzymes Recent Labs  Lab 08/14/17 1337 08/14/17 1907 08/15/17 0151  TROPONINI 0.67* 0.96* 1.20*    Glucose Recent Labs  Lab 08/14/17 0837 08/15/17 1613 08/15/17 2005 08/15/17 2346 08/16/17 0832  GLUCAP 100* 335* 290* 272* 271*    Imaging Dg Chest Port 1 View  Result Date: 08/16/2017 CLINICAL DATA:  Respiratory failure EXAM: PORTABLE CHEST 1 VIEW COMPARISON:  Yesterday FINDINGS: Endotracheal tube tip between the clavicular heads and carina. Left IJ line with tip at the upper SVC. An orogastric tube reaches the  stomach at least. Emphysema with thick walled bullae apically. Reticular opacities at the bases attributed to bronchitis and atelectasis based on CT yesterday. No edema, effusion, or pneumothorax. Cardiomegaly. Status post CABG. IMPRESSION: 1. Stable positioning of tubes and central line. 2. COPD. Electronically Signed   By: Marnee Spring M.D.   On: 08/16/2017 07:01     STUDIES:  CT head 12/20 > Age uncertain and possibly recent infarct involving the anterior limb of the left internal capsule. Elsewhere gray-white compartments appear normal. No mass or hemorrhage. Multifocal paranasal sinus disease with air-fluid levels in the right sphenoid and right maxillary sinuses. 08/15/2017 CT of the chest and abdomen essentially unremarkable.  CULTURES: Blood 12/20 > Urine 12/20 > CSF 12/20 > Flu PCR 12/20 > NEG 08/14/2017 sputum culture ordered but not done as of 08/15/2017>>  ANTIBIOTICS: Augmentin 12/17 >12/20 Ampicillin 12/20 > 08/15/2017 Vancomycin 12/20 >  Cefepime 12/20 > Azithromycin 12/20 > 08/15/2017 Acyclovir 12/20 > 08/15/2017 SIGNIFICANT EVENTS: 12/20 admit 08/14/2017 intubated  LINES/TUBES:   DISCUSSION: 66 year old male with COPD and RA on pred and methotrexate presenting with headache, weakness, and high fevers. Unclear origin. Sinusitis on CT. LP results pending. Covering meningitis. At risk intubation.  08/14/2017  remains hypotensive despite fluid resuscitation.  We will start pressure support possible central line insertion and arterial line insertion.  ASSESSMENT / PLAN:  PULMONARY A: Tachypnea compensation for lactic acidosis At risk airway compromise Chronic hypoxemic respiratory failure (3L at baseline) COPD without acute exacerbation. Steroid dependent at baseline prednisone 5mg  daily.  Bronchiectasis  P:   Intubated 08/14/2017 noted to have underlying bronchiectasis and worsening chest x-ray increasing respiratory failure and failed noninvasive mechanical  ventilatory support. Weaning well , consider extubation today  Scheduled nebs Solu-Cortef a place of prednisone  CARDIOVASCULAR A:  Septic shock Atrial fibrillation with RVR (with history of flutter s/p ablation) currently sinus tach  Weaned off pressors early 12/22.  P:  ICU hemodynamic montoring  RENAL Lab Results  Component Value Date   CREATININE 0.62 08/16/2017   CREATININE 0.81 08/15/2017   CREATININE 1.17 08/14/2017   CREATININE 0.74 10/14/2016   Recent Labs  Lab 08/14/17 1337 08/15/17 0517 08/16/17 0511  K 3.7 2.9* 2.1*    A:   Hypokalemia Hyponatremia resolved  Hypomagnesia resolved  Hypocalcemia Metabolic acidosis  P:   Supp K   Potassium replaced  Phos resplaced  D/c Bicarb drip   BMP in am   GASTROINTESTINAL A:   Diarrhea   P:   NPO C. difficile negative  HEMATOLOGIC A:   Mild thrombocytopenia Plt tr down 96>70 P:  D/c SQ heparin 12/22  Follow CBC Trend platelets  SCD   INFECTIOUS A:  Hyperpyrexia of unknown origin: Concern for meningitis with headache, sinusitis, and high fevers. R/o CAP as well.   Immunocompromised host PCT tr down  P:   Follow cultures LP results pending, 12/22  cerebrospinal fluid with no growth present final results pending ABX as above (amp, cefepime, vanco, azithro) want to cover pseudomonas due to bronchiectasis.  Tylenol PRN for fevers        AUTOIMMUNE A:   RA - followed at [facility]  P:   Holding methotrexate until able to take PO Holding prednisone in favor of stress dose hydrocort.  ENDOCRINE CBG (last 3)  Recent Labs    08/15/17 2005 08/15/17 2346 08/16/17 0832  GLUCAP 290* 272* 271*    A:   Relative AI Elevated glucose P:   Stress dose steroids.  Started 08/14/2017 Solu-Cortef given stat at 0 812 22,018 Start sliding scale insulin every 4 hours  NEUROLOGIC A:   Acute metabolic encephalopathy-improving   P:   Monitor closely. [place] should improve with fever.   08/14/2017  more responsive. 08/15/2017 when not sedated is awake alert follows commands 12/22>alert /oriented, f/c .    FAMILY  - Updates: Wife and patient updated at bedside 08/15/2017  - Inter-disciplinary family meet or Palliative Care meeting due by:  12/27       Hadar Elgersma NP-C  [facility]  [phone]   08/16/2017, 9:56 AM

## 2017-08-16 NOTE — Progress Notes (Signed)
eLink Physician-Brief Progress Note Patient Name: Joseph [name] DOB: 02/23/1951 MRN: [id number]   Date of Service  08/16/2017  HPI/Events of Note  Hypokalemia, hypophos and moderately low mag  eICU Interventions  Potassium, phos, and mag replaced     Intervention Category Intermediate Interventions: Electrolyte abnormality - evaluation and management  DETERDING,ELIZABETH 08/16/2017, 8:50 PM

## 2017-08-17 ENCOUNTER — Inpatient Hospital Stay (HOSPITAL_COMMUNITY): Payer: Medicare HMO

## 2017-08-17 LAB — GLUCOSE, CAPILLARY
GLUCOSE-CAPILLARY: 242 mg/dL — AB (ref 65–99)
Glucose-Capillary: 236 mg/dL — ABNORMAL HIGH (ref 65–99)
Glucose-Capillary: 206 mg/dL — ABNORMAL HIGH (ref 65–99)
Glucose-Capillary: 212 mg/dL — ABNORMAL HIGH (ref 65–99)
Glucose-Capillary: 206 mg/dL — ABNORMAL HIGH (ref 65–99)

## 2017-08-17 LAB — BASIC METABOLIC PANEL WITH GFR
Anion gap: 9 (ref 5–15)
BUN: 10 mg/dL (ref 6–20)
CO2: 27 mmol/L (ref 22–32)
Calcium: 7.4 mg/dL — ABNORMAL LOW (ref 8.9–10.3)
Chloride: 99 mmol/L — ABNORMAL LOW (ref 101–111)
Creatinine, Ser: 0.64 mg/dL (ref 0.61–1.24)
GFR calc Af Amer: >60 mL/min
GFR calc non Af Amer: >60 mL/min
Glucose, Bld: 237 mg/dL — ABNORMAL HIGH (ref 65–99)
Potassium: 2.7 mmol/L — CL (ref 3.5–5.1)
Sodium: 135 mmol/L (ref 135–145)

## 2017-08-17 LAB — CSF CULTURE

## 2017-08-17 LAB — CBC
HCT: 32.8 % — ABNORMAL LOW (ref 39.0–52.0)
Hemoglobin: 11 g/dL — ABNORMAL LOW (ref 13.0–17.0)
MCH: 29.5 pg (ref 26.0–34.0)
MCHC: 33.5 g/dL (ref 30.0–36.0)
MCV: 87.9 fL (ref 78.0–100.0)
Platelets: 70 10*3/uL — ABNORMAL LOW (ref 150–400)
RBC: 3.73 MIL/uL — ABNORMAL LOW (ref 4.22–5.81)
RDW: 16.2 % — ABNORMAL HIGH (ref 11.5–15.5)
WBC: 5.5 10*3/uL (ref 4.0–10.5)

## 2017-08-17 LAB — MAGNESIUM: Magnesium: 2.4 mg/dL (ref 1.7–2.4)

## 2017-08-17 LAB — LACTIC ACID, PLASMA: Lactic Acid, Venous: 2.1 mmol/L (ref 0.5–1.9)

## 2017-08-17 LAB — CSF CULTURE W GRAM STAIN: Culture: NO GROWTH

## 2017-08-17 LAB — PHOSPHORUS: PHOSPHORUS: 2.2 mg/dL — AB (ref 2.5–4.6)

## 2017-08-17 MED ORDER — INSULIN GLARGINE 100 UNIT/ML ~~LOC~~ SOLN
5.0000 [IU] | Freq: Once | SUBCUTANEOUS | Status: AC
  Administered 2017-08-17: 5 [IU] via SUBCUTANEOUS
  Filled 2017-08-17 (×2): qty 0.05

## 2017-08-17 MED ORDER — GUAIFENESIN ER 600 MG PO TB12
600.0000 mg | ORAL_TABLET | Freq: Two times a day (BID) | ORAL | Status: DC
  Administered 2017-08-17 – 2017-08-18 (×3): 600 mg via ORAL
  Filled 2017-08-17 (×4): qty 1

## 2017-08-17 MED ORDER — FLUTICASONE FUROATE-VILANTEROL 100-25 MCG/INH IN AEPB
1.0000 | INHALATION_SPRAY | Freq: Every day | RESPIRATORY_TRACT | Status: DC
  Administered 2017-08-17 – 2017-08-22 (×6): 1 via RESPIRATORY_TRACT
  Filled 2017-08-17: qty 28

## 2017-08-17 MED ORDER — INSULIN GLARGINE 100 UNIT/ML ~~LOC~~ SOLN
10.0000 [IU] | Freq: Every day | SUBCUTANEOUS | Status: DC
  Administered 2017-08-18 – 2017-08-22 (×5): 10 [IU] via SUBCUTANEOUS
  Filled 2017-08-17 (×5): qty 0.1

## 2017-08-17 MED ORDER — UMECLIDINIUM BROMIDE 62.5 MCG/INH IN AEPB
1.0000 | INHALATION_SPRAY | Freq: Every day | RESPIRATORY_TRACT | Status: DC
  Administered 2017-08-17 – 2017-08-22 (×6): 1 via RESPIRATORY_TRACT
  Filled 2017-08-17: qty 7

## 2017-08-17 MED ORDER — AMITRIPTYLINE HCL 10 MG PO TABS
10.0000 mg | ORAL_TABLET | Freq: Every day | ORAL | Status: DC
  Administered 2017-08-17 – 2017-08-21 (×5): 10 mg via ORAL
  Filled 2017-08-17 (×5): qty 1

## 2017-08-17 MED ORDER — ZOLPIDEM TARTRATE 5 MG PO TABS
5.0000 mg | ORAL_TABLET | Freq: Once | ORAL | Status: AC
  Administered 2017-08-17: 5 mg via ORAL
  Filled 2017-08-17: qty 1

## 2017-08-17 MED ORDER — POTASSIUM CHLORIDE CRYS ER 20 MEQ PO TBCR
40.0000 meq | EXTENDED_RELEASE_TABLET | Freq: Once | ORAL | Status: AC
  Administered 2017-08-17: 40 meq via ORAL
  Filled 2017-08-17: qty 2

## 2017-08-17 MED ORDER — FLUTICASONE-UMECLIDIN-VILANT 100-62.5-25 MCG/INH IN AEPB: 1.0000 | INHALATION_SPRAY | Freq: Every day | RESPIRATORY_TRACT | Status: DC

## 2017-08-17 MED ORDER — PANTOPRAZOLE SODIUM 40 MG PO TBEC
40.0000 mg | DELAYED_RELEASE_TABLET | Freq: Every day | ORAL | Status: DC
  Administered 2017-08-17 – 2017-08-22 (×6): 40 mg via ORAL
  Filled 2017-08-17 (×6): qty 1

## 2017-08-17 MED ORDER — INSULIN GLARGINE 100 UNIT/ML ~~LOC~~ SOLN: 8.0000 [IU] | Freq: Every day | SUBCUTANEOUS | Status: DC

## 2017-08-17 MED ORDER — INSULIN ASPART 100 UNIT/ML ~~LOC~~ SOLN
0.0000 [IU] | Freq: Three times a day (TID) | SUBCUTANEOUS | Status: DC
  Administered 2017-08-17 – 2017-08-18 (×2): 7 [IU] via SUBCUTANEOUS
  Administered 2017-08-18: 4 [IU] via SUBCUTANEOUS
  Administered 2017-08-18: 3 [IU] via SUBCUTANEOUS
  Administered 2017-08-19 (×2): 4 [IU] via SUBCUTANEOUS
  Administered 2017-08-19 – 2017-08-20 (×2): 3 [IU] via SUBCUTANEOUS
  Administered 2017-08-20 – 2017-08-22 (×4): 4 [IU] via SUBCUTANEOUS
  Administered 2017-08-22: 7 [IU] via SUBCUTANEOUS

## 2017-08-17 MED ORDER — PREGABALIN 50 MG PO CAPS
75.0000 mg | ORAL_CAPSULE | Freq: Two times a day (BID) | ORAL | Status: DC
  Administered 2017-08-17 – 2017-08-22 (×10): 75 mg via ORAL
  Filled 2017-08-17 (×10): qty 1

## 2017-08-17 NOTE — Progress Notes (Signed)
CRITICAL VALUE ALERT  Critical Value:  K+ 2.7 Date & Time Notied:  08/17/17 @11 :10  Provider Notified: , NP

## 2017-08-17 NOTE — Progress Notes (Signed)
PULMONARY / CRITICAL CARE MEDICINE   Name: Joseph [name] MRN: [id number] DOB: [birth year]    ADMISSION DATE:  08/14/2017 CONSULTATION DATE:  08/14/2017  REFERRING MD: Dr. Bebe Shaggy  CHIEF COMPLAINT: Fever  HISTORY OF PRESENT ILLNESS:   66 year old male with past medical history as below, which is significant for rheumatoid arthritis (on daily prednisone and methotrexate), COPD and bronchiectasis on 3 L oxygen, diabetes, GERD, recurrent pneumonia secondary to chronic aspiration/reflux, and coronary artery disease.  He has a several day history of what he considered to be a sinus infection that he picked up from his wife.  He presented to pulmonary clinic for this and was written a prescription for Augmentin.  His symptoms progressed and he developed high fevers, headache, malaise.  He presented to the emergency department on 12/20 with these complaints.  Symptoms were felt to be infectious in origin and potentially meningitis.  He had LP done in the emergency department and was started on broad-spectrum antibiotic.  He was hypotensive despite IV fluid bolus and PCCM was asked to admit.    SUBJECTIVE:  Extubated 12/22 , doing well . On O2 3-4l/m (on O2 at home at 3l/m )  Awake /alert. Eating well .  Discussion with wife and pt , requests  DNR status.     VITAL SIGNS: BP 106/67   Pulse (!) 102   Temp 99.4 F (37.4 C) (Rectal)   Resp 20   Ht 6' (1.829 m)   Wt 237 lb 14 oz (107.9 kg)   SpO2 98%   BMI 32.26 kg/m   HEMODYNAMICS: CVP:  [10 mmHg-15 mmHg] 10 mmHg  VENTILATOR SETTINGS:    INTAKE / OUTPUT: I/O last 3 completed shifts: In: 4653.8 [P.O.:720; I.V.:1314.7; IV Piggyback:2619.1] Out: 3050 [Urine:2950; Emesis/NG output:100]  PHYSICAL EXAMINATION: General: elderly male  HEENT: [place] /AT , moist oral mucosa  PSY: Normal effect Neuro: Intact, f/c  CV: s1s2 rrr, no m/r/g PULM: Rhonchi bilaterally .  BD:ZHGD, non-tender, bsx4 active  Extremities: warm/dry, 1-2+ gen edema    , arthritic changes in hands/feet.  Skin: no rashes or lesions    LABS:  BMET Recent Labs  Lab 08/15/17 0517 08/16/17 0511 08/16/17 1815  NA 136 141 139  K 2.9* 2.1* 2.9*  CL 105 104 100*  CO2 22 27 28   BUN 12 12 12   CREATININE 0.81 0.62 0.63  GLUCOSE 387* 267* 242*    Electrolytes Recent Labs  Lab 08/15/17 0517 08/16/17 0511 08/16/17 1815 08/17/17 0430  CALCIUM 7.0* 7.5* 7.5*  --   MG 1.3* 2.0 1.8 2.4  PHOS 2.4* 1.4* 1.5* 2.2*    CBC Recent Labs  Lab 08/15/17 0517 08/16/17 0511 08/17/17 0430  WBC 9.1 5.4 5.5  HGB 11.9* 11.1* 11.0*  HCT 34.7* 33.1* 32.8*  PLT 93* 70* 70*    Coag's Recent Labs  Lab 08/14/17 0121 08/14/17 0152  APTT  --  29  INR 1.13  --     Sepsis Markers Recent Labs  Lab 08/14/17 0804 08/14/17 0906 08/14/17 1602 08/15/17 0517 08/16/17 0511 08/17/17 0430  LATICACIDVEN  --  3.0* 4.3*  --   --  2.1*  PROCALCITON 22.20  --   --  17.92 12.62  --     ABG Recent Labs  Lab 08/14/17 1615 08/15/17 0624 08/16/17 0330  PHART 7.144* 7.454* 7.520*  PCO2ART 48.1* 33.1 34.5  PO2ART 269.0* 150* 151*    Liver Enzymes Recent Labs  Lab 08/14/17 0121  AST 63*  ALT 42  ALKPHOS 81  BILITOT 1.5*  ALBUMIN 3.7    Cardiac Enzymes Recent Labs  Lab 08/14/17 1337 08/14/17 1907 08/15/17 0151  TROPONINI 0.67* 0.96* 1.20*    Glucose Recent Labs  Lab 08/16/17 1201 08/16/17 1550 08/16/17 2044 08/16/17 2346 08/17/17 0430 08/17/17 0807  GLUCAP 248* 267* 214* 220* 236* 206*    Imaging Dg Chest Port 1 View  Result Date: 08/17/2017 CLINICAL DATA:  Respiratory failure EXAM: PORTABLE CHEST 1 VIEW COMPARISON:  Yesterday FINDINGS: Fairly stable inflation after extubation yesterday. There is interstitial opacity attributed to patient's severe emphysema, with thick wall bullae bilaterally. No acute opacity when compared to remote studies. No effusion or pneumothorax. Left IJ line with tip at the SVC level. IMPRESSION: 1. No acute  finding after extubation. 2. COPD. Electronically Signed   By: Marnee Spring M.D.   On: 08/17/2017 06:51     STUDIES:  CT head 12/20 > Age uncertain and possibly recent infarct involving the anterior limb of the left internal capsule. Elsewhere gray-white compartments appear normal. No mass or hemorrhage. Multifocal paranasal sinus disease with air-fluid levels in the right sphenoid and right maxillary sinuses. 08/15/2017 CT of the chest and abdomen essentially unremarkable.  CULTURES: Blood 12/20 > Urine 12/20 >NEG  CSF 12/20 >NEG Flu PCR 12/20 > NEG 08/14/2017 sputum culture ordered but not done as of 08/15/2017>>  ANTIBIOTICS: Augmentin 12/17 >12/20 Ampicillin 12/20 > 08/15/2017 Vancomycin 12/20 >  Cefepime 12/20 > Azithromycin 12/20 > 08/15/2017 Acyclovir 12/20 > 08/15/2017  SIGNIFICANT EVENTS: 12/20 admit 08/14/2017 intubated  LINES/TUBES:   DISCUSSION: 66 year old male with COPD and RA on pred and methotrexate presenting with headache, weakness, and high fevers. Unclear origin. Sinusitis on CT. LP results pending. Covering meningitis. At risk intubation.  08/14/2017  remains hypotensive despite fluid resuscitation.  We will start pressure support possible central line insertion and arterial line insertion.  ASSESSMENT / PLAN:  PULMONARY A: Acute on Chronic hypoxemic respiratory failure (3L at baseline).- extubated 12/22.  COPD without acute exacerbation. Steroid dependent at baseline prednisone 5mg  daily.  Bronchiectasis  P:   Continue on O2 to keep sats >90%.  Scheduled nebs Add Flutter valve  Solu-Cortef a place of prednisone- consider transition to oral 12/24 if able.  Restart TRELEGY .daily   CARDIOVASCULAR A:  Septic shock-Resolved , off pressors 12/22  Atrial fibrillation with RVR (with history of flutter s/p ablation) currently sinus tach  Decompensated D CHF - Echo 12/21 >EF nml , gr 1 DD , PAP .   P:  Transfer to SDU  Continue Diuresis as  b/p and scr allow   RENAL Lab Results  Component Value Date   CREATININE 0.63 08/16/2017   CREATININE 0.62 08/16/2017   CREATININE 0.81 08/15/2017   CREATININE 0.74 10/14/2016   Recent Labs  Lab 08/15/17 0517 08/16/17 0511 08/16/17 1815  K 2.9* 2.1* 2.9*    A:   Hypokalemia-  Hyponatremia resolved  Hypomagnesia resolved  Hypocalcemia Metabolic acidosis-resolved  Hypophos >improved   P:   Supp K   Potassium replaced 12/22 .  Check  BMP now  bmet in am    GASTROINTESTINAL A:   Diarrhea -improved   P:   C. difficile negative Diet as tolerated.   HEMATOLOGIC A:   Mild thrombocytopenia Plt tr down 96>70 P:  OFF  SQ heparin 12/22  Follow CBC Trend platelets  SCD   INFECTIOUS A:   Hyperpyrexia of unknown origin: Concern for meningitis with headache, sinusitis, and high fevers. R/o  CAP as well.  LP neg  Immunocompromised host PCT tr down  P:   Follow cultures LP results pending, 12/22  cerebrospinal fluid with no growth present final results pending ABX as above (cefepime, vanco, ) want to cover pseudomonas due to bronchiectasis.  Tylenol PRN for fevers Tr PCT         AUTOIMMUNE A:   RA - followed at [facility]  P:   Holding methotrexate until able to take PO Holding prednisone in favor of stress dose hydrocort.-transition on 12/24 if able   ENDOCRINE CBG (last 3)  Recent Labs    08/16/17 2346 08/17/17 0430 08/17/17 0807  GLUCAP 220* 236* 206*    A:   Relative AI Elevated glucose P:   Stress dose steroids.  Started 08/14/2017 SSI  NEUROLOGIC A:   Acute metabolic encephalopathy-improving   P:   Monitor   FAMILY  - Updates: Wife and patient updated at bedside 12/23, request DNR status   - Inter-disciplinary family meet or Palliative Care meeting due by:  12/27       Braxley Balandran NP-C  [facility]  [phone]   08/17/2017, 11:54 AM

## 2017-08-17 NOTE — Progress Notes (Signed)
Dr. Darrick Penna called regarding increased lactic acid of 2.1. Pt also noted to have strong minimally productive cough, and requesting an expectorant. New orders received and noted.

## 2017-08-18 LAB — GLUCOSE, CAPILLARY
GLUCOSE-CAPILLARY: 183 mg/dL — AB (ref 65–99)
GLUCOSE-CAPILLARY: 138 mg/dL — AB (ref 65–99)
Glucose-Capillary: 209 mg/dL — ABNORMAL HIGH (ref 65–99)

## 2017-08-18 LAB — MAGNESIUM: Magnesium: 2 mg/dL (ref 1.7–2.4)

## 2017-08-18 LAB — BASIC METABOLIC PANEL
Anion gap: 8 (ref 5–15)
BUN: 11 mg/dL (ref 6–20)
CHLORIDE: 100 mmol/L — AB (ref 101–111)
CO2: 30 mmol/L (ref 22–32)
CREATININE: 0.63 mg/dL (ref 0.61–1.24)
Calcium: 8.2 mg/dL — ABNORMAL LOW (ref 8.9–10.3)
GFR calc Af Amer: >60 mL/min (ref >60)
GFR calc non Af Amer: >60 mL/min (ref >60)
GLUCOSE: 223 mg/dL — AB (ref 65–99)
POTASSIUM: 2.9 mmol/L — AB (ref 3.5–5.1)
SODIUM: 138 mmol/L (ref 135–145)

## 2017-08-18 LAB — CBC
HEMATOCRIT: 31 % — AB (ref 39.0–52.0)
Hemoglobin: 10.1 g/dL — ABNORMAL LOW (ref 13.0–17.0)
MCH: 29.2 pg (ref 26.0–34.0)
MCHC: 32.6 g/dL (ref 30.0–36.0)
MCV: 89.6 fL (ref 78.0–100.0)
Platelets: 79 10*3/uL — ABNORMAL LOW (ref 150–400)
RBC: 3.46 MIL/uL — ABNORMAL LOW (ref 4.22–5.81)
RDW: 16.4 % — AB (ref 11.5–15.5)
WBC: 3.9 10*3/uL — AB (ref 4.0–10.5)

## 2017-08-18 LAB — HIV 1/2 AB DIFFERENTIATION
HIV 1 Ab: NEGATIVE
HIV 2 AB: NEGATIVE
NOTE (HIV CONF MULTISPOT): NEGATIVE

## 2017-08-18 LAB — RNA QUALITATIVE: HIV 1 RNA Qualitative: 1

## 2017-08-18 LAB — PHOSPHORUS: Phosphorus: 1.3 mg/dL — ABNORMAL LOW (ref 2.5–4.6)

## 2017-08-18 LAB — HIV ANTIBODY (ROUTINE TESTING W REFLEX): HIV Screen 4th Generation wRfx: REACTIVE — AB

## 2017-08-18 LAB — PROCALCITONIN: PROCALCITONIN: 3.77 ng/mL

## 2017-08-18 LAB — LACTIC ACID, PLASMA: LACTIC ACID, VENOUS: 3.7 mmol/L — AB (ref 0.5–1.9)

## 2017-08-18 MED ORDER — DEXTROMETHORPHAN POLISTIREX ER 30 MG/5ML PO SUER
30.0000 mg | Freq: Once | ORAL | Status: AC
  Administered 2017-08-18: 30 mg via ORAL
  Filled 2017-08-18: qty 5

## 2017-08-18 MED ORDER — PREDNISONE 20 MG PO TABS
30.0000 mg | ORAL_TABLET | Freq: Every day | ORAL | Status: DC
  Administered 2017-08-18 – 2017-08-22 (×5): 30 mg via ORAL
  Filled 2017-08-18 (×5): qty 1

## 2017-08-18 MED ORDER — ZOLPIDEM TARTRATE 5 MG PO TABS
10.0000 mg | ORAL_TABLET | Freq: Every day | ORAL | Status: DC
  Administered 2017-08-18: 10 mg via ORAL
  Administered 2017-08-19 – 2017-08-21 (×3): 5 mg via ORAL
  Filled 2017-08-18 (×4): qty 2

## 2017-08-18 MED ORDER — ACETAMINOPHEN 500 MG PO TABS
500.0000 mg | ORAL_TABLET | Freq: Four times a day (QID) | ORAL | Status: DC | PRN
  Administered 2017-08-18 – 2017-08-21 (×9): 500 mg via ORAL
  Filled 2017-08-18 (×9): qty 1

## 2017-08-18 MED ORDER — BENZONATATE 100 MG PO CAPS
100.0000 mg | ORAL_CAPSULE | Freq: Two times a day (BID) | ORAL | Status: DC | PRN
  Administered 2017-08-18 – 2017-08-21 (×4): 100 mg via ORAL
  Filled 2017-08-18 (×4): qty 1

## 2017-08-18 MED ORDER — DM-GUAIFENESIN ER 30-600 MG PO TB12
1.0000 | ORAL_TABLET | Freq: Two times a day (BID) | ORAL | Status: DC
  Administered 2017-08-18 – 2017-08-22 (×8): 1 via ORAL
  Filled 2017-08-18 (×9): qty 1

## 2017-08-18 MED ORDER — AZELASTINE HCL 0.1 % NA SOLN
2.0000 | Freq: Two times a day (BID) | NASAL | Status: DC
  Administered 2017-08-18 – 2017-08-22 (×9): 2 via NASAL
  Filled 2017-08-18: qty 30

## 2017-08-18 MED ORDER — DEXTROSE 5 % IV SOLN
30.0000 mmol | Freq: Once | INTRAVENOUS | Status: AC
  Administered 2017-08-18: 30 mmol via INTRAVENOUS
  Filled 2017-08-18: qty 10

## 2017-08-18 MED ORDER — POTASSIUM CHLORIDE CRYS ER 20 MEQ PO TBCR
40.0000 meq | EXTENDED_RELEASE_TABLET | Freq: Two times a day (BID) | ORAL | Status: AC
  Administered 2017-08-18 (×2): 40 meq via ORAL
  Filled 2017-08-18 (×2): qty 2

## 2017-08-18 MED ORDER — FLUTICASONE PROPIONATE 50 MCG/ACT NA SUSP
2.0000 | Freq: Two times a day (BID) | NASAL | Status: DC
  Administered 2017-08-18 – 2017-08-22 (×9): 2 via NASAL
  Filled 2017-08-18 (×2): qty 16

## 2017-08-18 NOTE — Progress Notes (Signed)
CRITICAL VALUE ALERT  Critical Value:  Lactic Acid  Date & Time Notied: 08/18/17 0631  Provider Notified: Jovita Kussmaul, NP  Orders Received/Actions taken: No new orders given at this time.

## 2017-08-18 NOTE — Progress Notes (Signed)
Pharmacy Antibiotic Note  Joseph [name] is a 65 y.o. male admitted on 08/14/2017 with altered mental status.  On D#5 of Cefepime for pneumonia. WBC WNL, SCr 0.63, LA 3.7, PCT 3.77 (down)   12/20 blood x 2 - NGTD 12/20 urine - NEG 12/20 CSF - NEG 12/20 flu neg 12/20 Cdiff - NEG 12/20 MRSA - NEG Legionella - NEG Strep urine - NEG HIV - ip HSV - NEG  Plan: Cefepime 2g IV q8h. F/u LOT   Height: 6' (182.9 cm) Weight: 237 lb 14 oz (107.9 kg) IBW/kg (Calculated) : 77.6  Temp (24hrs), Avg:98 F (36.7 C), Min:97.3 F (36.3 C), Max:98.6 F (37 C)  Recent Labs  Lab 08/14/17 0340  08/14/17 0906  08/14/17 1602 08/14/17 1700 08/15/17 0517 08/16/17 0511 08/16/17 1815 08/17/17 0430 08/17/17 1107 08/18/17 0511  WBC  --    < >  --   --   --  8.2 9.1 5.4  --  5.5  --  3.9*  CREATININE  --    < >  --    < >  --   --  0.81 0.62 0.63  --  0.64 0.63  LATICACIDVEN 4.77*  --  3.0*  --  4.3*  --   --   --   --  2.1*  --  3.7*   < > = values in this interval not displayed.    Estimated Creatinine Clearance: 116.8 mL/min (by C-G formula based on SCr of 0.63 mg/dL).    No Known Allergies  Vinnie Level, PharmD., BCPS Clinical Pharmacist Pager [phone]

## 2017-08-18 NOTE — Progress Notes (Signed)
PULMONARY / CRITICAL CARE MEDICINE   Name: Joseph [name] MRN: [id number] DOB: 1950/09/28    ADMISSION DATE:  08/14/2017 CONSULTATION DATE:  08/14/2017  REFERRING MD: Dr. Bebe Shaggy  CHIEF COMPLAINT: Fever  HISTORY OF PRESENT ILLNESS:   66 year old male with past medical history as below, which is significant for rheumatoid arthritis (on daily prednisone and methotrexate), COPD and bronchiectasis on 3 L oxygen, diabetes, GERD, recurrent pneumonia secondary to chronic aspiration/reflux, and coronary artery disease.  He has a several day history of what he considered to be a sinus infection that he picked up from his wife.  He presented to pulmonary clinic for this and was written a prescription for Augmentin.  His symptoms progressed and he developed high fevers, headache, malaise.  He presented to the emergency department on 12/20 with these complaints.  Symptoms were felt to be infectious in origin and potentially meningitis.  He had LP done in the emergency department and was started on broad-spectrum antibiotic.  He was hypotensive despite IV fluid bolus and PCCM was asked to admit.   SUBJECTIVE:  Continues to feel better   VITAL SIGNS: BP (!) 123/106   Pulse 91   Temp 98.2 F (36.8 C) (Oral)   Resp 20   Ht 6' (1.829 m)   Wt 237 lb 14 oz (107.9 kg)   SpO2 97%   BMI 32.26 kg/m  4 liters  HEMODYNAMICS:    VENTILATOR SETTINGS:    INTAKE / OUTPUT: I/O last 3 completed shifts: In: 2224 [P.O.:720; WGNFA:213; IV Piggyback:1060] Out: 3525 [Urine:3525]  PHYSICAL EXAMINATION:  General: 66 year old white male currently resting comfortably in bed no acute distress. HEENT: Normocephalic atraumatic mucous membranes are moist no jugular venous distention Pulmonary: No accessory muscle use.  Some scattered rhonchi that clear with cough Cardiac: Regular rate and rhythm without murmur rub or gallop  extremities/musculoskeletal: Warm, dry, brisk cap refill no edema strong  pulses Abdomen: Soft nontender positive bowel sounds no organomegaly  neuro: Awake alert oriented no focal deficits   LABS:  He is ready for transfer to MedSurg BMET Recent Labs  Lab 08/16/17 1815 08/17/17 1107 08/18/17 0511  NA 139 135 138  K 2.9* 2.7* 2.9*  CL 100* 99* 100*  CO2 28 27 30   BUN 12 10 11   CREATININE 0.63 0.64 0.63  GLUCOSE 242* 237* 223*    Electrolytes Recent Labs  Lab 08/16/17 1815 08/17/17 0430 08/17/17 1107 08/18/17 0511  CALCIUM 7.5*  --  7.4* 8.2*  MG 1.8 2.4  --  2.0  PHOS 1.5* 2.2*  --  1.3*    CBC Recent Labs  Lab 08/16/17 0511 08/17/17 0430 08/18/17 0511  WBC 5.4 5.5 3.9*  HGB 11.1* 11.0* 10.1*  HCT 33.1* 32.8* 31.0*  PLT 70* 70* 79*    Coag's Recent Labs  Lab 08/14/17 0121 08/14/17 0152  APTT  --  29  INR 1.13  --     Sepsis Markers Recent Labs  Lab 08/14/17 1602 08/15/17 0517 08/16/17 0511 08/17/17 0430 08/18/17 0511  LATICACIDVEN 4.3*  --   --  2.1* 3.7*  PROCALCITON  --  17.92 12.62  --  3.77    ABG Recent Labs  Lab 08/14/17 1615 08/15/17 0624 08/16/17 0330  PHART 7.144* 7.454* 7.520*  PCO2ART 48.1* 33.1 34.5  PO2ART 269.0* 150* 151*    Liver Enzymes Recent Labs  Lab 08/14/17 0121  AST 63*  ALT 42  ALKPHOS 81  BILITOT 1.5*  ALBUMIN 3.7    Cardiac  Enzymes Recent Labs  Lab 08/14/17 1337 08/14/17 1907 08/15/17 0151  TROPONINI 0.67* 0.96* 1.20*    Glucose Recent Labs  Lab 08/17/17 0430 08/17/17 0807 08/17/17 1156 08/17/17 1657 08/17/17 2016 08/18/17 0815  GLUCAP 236* 206* 212* 242* 206* 209*    Imaging No results found.   STUDIES:  CT head 12/20 > Age uncertain and possibly recent infarct involving the anterior limb of the left internal capsule. Elsewhere gray-white compartments appear normal. No mass or hemorrhage. Multifocal paranasal sinus disease with air-fluid levels in the right sphenoid and right maxillary sinuses. 08/15/2017 CT of the chest and abdomen essentially  unremarkable.  CULTURES: Blood 12/20 > Urine 12/20 >NEG  CSF 12/20 >NEG Flu PCR 12/20 > NEG 08/14/2017 sputum culture ordered but not done as of 08/15/2017>>  ANTIBIOTICS: Augmentin 12/17 >12/20 Ampicillin 12/20 > 08/15/2017 Vancomycin 12/20 >  Cefepime 12/20 > Azithromycin 12/20 > 08/15/2017 Acyclovir 12/20 > 08/15/2017  SIGNIFICANT EVENTS: 12/20 admit 08/14/2017 intubated  LINES/TUBES:   DISCUSSION: 66 year old male with COPD and RA on pred and methotrexate admitted w/ sepsis w/out clear source. Required aggressive volume resuscitation/EGDT and ultimately intubation/mechanical ventilation. Extubated 12/22  ASSESSMENT / PLAN:   Acute on Chronic hypoxemic respiratory failure (3L at baseline).- extubated 12/22.  COPD without acute exacerbation. Steroid dependent at baseline prednisone 5mg  daily.  Bronchiectasis Plan Continue on O2 to keep sats >90%.  Scheduled nebs Cont flutter Transition solucortef to pred Trilogy daily at HS and PRN Mobilize   Septic shock-Resolved , off pressors 12/22  Atrial fibrillation with RVR (with history of flutter s/p ablation) currently sinus tach  Decompensated D CHF - Echo 12/21 >EF nml , gr 1 DD , PAP 1/22.  Plan Cont tele  Cont lasix as BP/BUN/cr allow  Fluid and electrolyte imbalance: hypokalemia, hypophosphatemia  Plan Replace and recheck  May need stop IV lasix soon  Diarrhea -improved  C. difficile negative Plan Cont diet  Diet as tolerated.    Mild thrombocytopenia Heparin stopped 12/22. PLTs slowly improving Plan scds PRN CBC    Sepsis/Hyperpyrexia of unknown origin in Immunocompromised host Concern for meningitis with headache, sinusitis, and high  R/o CAP as well.   -LP neg  -culture data negative to date -vanc stopped 12/23 -pct cont to improve.  Plan Day 6/8 abx (still on cefepime)   RA - followed at [facility] MTX Transition back to pred w/ taper to home dose  Holding prednisone in  favor of stress dose hydrocort.-transition on 12/24 if able   Relative AI Elevated glucose Plan  ssi and steroids as above    Acute metabolic encephalopathy-improving  Plan Monitor   FAMILY  - Updates: Wife and patient updated at bedside 12/23, request DNR status   - Inter-disciplinary family meet or Palliative Care meeting due by:  12/27  He's ready for transfer to SDU  1/28 ACNP-BC [facility] Pul[facility]/Critical Care Pager # [phone] OR # [phone] if no answer

## 2017-08-18 NOTE — Progress Notes (Signed)
Patient was in his bed with oxygen tube in. Nurse on-site helping him. Wife on-site and both especially wife very talkative of stories ranging from politics, [place] stuff and world at large. Both very appreciative of chaplain's visit. Chaplain provided reflective listening, compassionate presence and prayer.     08/18/17 1500  Clinical Encounter Type  Visited With Patient and family together  Referral From Chaplain  Spiritual Encounters  Spiritual Needs Prayer;Emotional  Stress Factors  Patient Stress Factors Exhausted

## 2017-08-19 LAB — CULTURE, BLOOD (ROUTINE X 2)
CULTURE: NO GROWTH
Culture: NO GROWTH
Special Requests: ADEQUATE
Special Requests: ADEQUATE

## 2017-08-19 LAB — GLUCOSE, CAPILLARY
GLUCOSE-CAPILLARY: 179 mg/dL — AB (ref 65–99)
GLUCOSE-CAPILLARY: 199 mg/dL — AB (ref 65–99)
Glucose-Capillary: 110 mg/dL — ABNORMAL HIGH (ref 65–99)
Glucose-Capillary: 121 mg/dL — ABNORMAL HIGH (ref 65–99)
Glucose-Capillary: 125 mg/dL — ABNORMAL HIGH (ref 65–99)

## 2017-08-19 LAB — BASIC METABOLIC PANEL
ANION GAP: 11 (ref 5–15)
BUN: 12 mg/dL (ref 6–20)
CHLORIDE: 99 mmol/L — AB (ref 101–111)
CO2: 29 mmol/L (ref 22–32)
Calcium: 8.4 mg/dL — ABNORMAL LOW (ref 8.9–10.3)
Creatinine, Ser: 0.68 mg/dL (ref 0.61–1.24)
Glucose, Bld: 176 mg/dL — ABNORMAL HIGH (ref 65–99)
POTASSIUM: 2.6 mmol/L — AB (ref 3.5–5.1)
SODIUM: 139 mmol/L (ref 135–145)

## 2017-08-19 MED ORDER — FUROSEMIDE 10 MG/ML IJ SOLN
40.0000 mg | Freq: Every day | INTRAMUSCULAR | Status: DC
  Administered 2017-08-20 – 2017-08-22 (×3): 40 mg via INTRAVENOUS
  Filled 2017-08-19 (×3): qty 4

## 2017-08-19 MED ORDER — POTASSIUM CHLORIDE CRYS ER 20 MEQ PO TBCR
40.0000 meq | EXTENDED_RELEASE_TABLET | Freq: Once | ORAL | Status: AC
  Administered 2017-08-19: 40 meq via ORAL
  Filled 2017-08-19: qty 2

## 2017-08-19 MED ORDER — POTASSIUM CHLORIDE CRYS ER 20 MEQ PO TBCR
40.0000 meq | EXTENDED_RELEASE_TABLET | Freq: Once | ORAL | Status: AC
  Administered 2017-08-20: 40 meq via ORAL
  Filled 2017-08-19: qty 2

## 2017-08-19 NOTE — Progress Notes (Signed)
Patient Demographics:    Joseph [name], is a 66 y.o. male, DOB - 05/11/1951, OXB:[id number]  Admit date - 08/14/2017   Admitting Physician Charlesetta Garibaldi, MD  Outpatient Primary MD for the patient is Colon Branch, MD  LOS - 5   Chief Complaint  Patient presents with  . 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  H/o rheumatoid arthritis (on daily prednisone and methotrexate), COPD/ bronchiectasis on 3 L oxygen, diabetes, GERD/Barretts, recurrent pneumonia secondary to chronic aspiration/reflux, and coronary artery disease. Pt had a URI/sinus symptoms  presented to pulmonary clinic for this and was written a prescription for Augmentin. His symptoms progressed and he developed high fevers (106), diarrhea, headache, malaise.  Symptoms were felt to be infectious in origin and potentially meningitis. He had LP done in the emergency department and was started on broad-spectrum antibiotic. He was hypotensive despite IV fluid bolus and PCCM admitted him on 08/14/17, transferred to [facility] on 08/19/17   Plan:- 1)Sepsis of Unknown origin-patient met  criteria for septic shock on admission with fevers and persistent hypotension and elevated lactic acid levels despite  IV fluid boluses, pro calcitonin was elevated okay to complete cefepime for 8 days (last dose 08/21/17), vancomycin has been discontinued, MRSA screen is negative, stool for C. difficile negative, CSF Gram stain and culture negative so far.  Urine and blood cultures are negative  2)Acute on chronic Hypoxic Respiratory failure-in the setting of COPD/bronchiectasis, cefepime as above to cover for possible Pseudomonas patient with bronchiectasis, continue bronchodilators mucolytics supplemental oxygen  3) history of rheumatoid arthritis/immunosuppressant use-methotrexate on hold, continue prednisone 30 mg daily, patient previously completed a stress dose hydrocortisone  4)HFpEF-last known EF 55 to 60%, patient has a history of chronic diastolic dysfunction CHF, appears compensated, change Lasix to 40 mg daily, monitor fluid input, output and daily weights as well as electrolytes closely  5)FEN-hypokalemia, most likely worsened by diuretic use, replace and recheck  6)DM-stable, last A1c 6.1, continue Lantus insulin 10 units daily along with sliding scale coverage  7)Generalized Weakness/Debility-with physical therapy evaluation, ???  Candidate for inpatient rehab versus skilled nursing facility  Code Status : DNR  Disposition Plan  : Inpatient Rehab Vs SNF   Consults  :  PCCM, PT/OT   DVT Prophylaxis  :  SCDs (low Platelets)  Lab Results  Component Value Date   PLT 79 (L) 08/18/2017    Inpatient Medications  Scheduled Meds: . amitriptyline  10 mg Oral QHS  . azelastine  2 spray Each Nare BID  . dextromethorphan-guaiFENesin  1 tablet Oral  BID  . fluticasone  2 spray Each Nare BID  . fluticasone furoate-vilanterol  1 puff Inhalation Daily   And  . umeclidinium bromide  1 puff Inhalation Daily  . [START ON 08/20/2017] furosemide  40 mg Intravenous Daily  . insulin aspart  0-20 Units Subcutaneous TID WC  . insulin glargine  10 Units Subcutaneous Daily  . ipratropium-albuterol  3 mL  Nebulization Q6H  . pantoprazole  40 mg Oral Daily  . potassium chloride  40 mEq Oral Once  . potassium chloride  40 mEq Oral Once  . predniSONE  30 mg Oral Q breakfast  . pregabalin  75 mg Oral BID  . zolpidem  10 mg Oral QHS   Continuous Infusions: . sodium chloride    . ceFEPime (MAXIPIME) IV Stopped (08/19/17 1509)   PRN Meds:.[CANCELED] Place/Maintain arterial line **AND** sodium chloride, acetaminophen, benzonatate, metoCLOPramide (REGLAN) injection, ondansetron (ZOFRAN) IV, sennosides    Anti-infectives (From admission, onward)   Start     Dose/Rate Route Frequency Ordered Stop   08/14/17 2200  azithromycin (ZITHROMAX) 500 mg in dextrose 5 % 250 mL IVPB  Status:  Discontinued     500 mg 250 mL/hr over 60 Minutes Intravenous Every 24 hours 08/14/17 0546 08/15/17 1112   08/14/17 2000  cefTRIAXone (ROCEPHIN) 2 g in dextrose 5 % 50 mL IVPB  Status:  Discontinued     2 g 100 mL/hr over 30 Minutes Intravenous Every 12 hours 08/14/17 0546 08/14/17 0554   08/14/17 0800  ampicillin (OMNIPEN) 2 g in sodium chloride 0.9 % 50 mL IVPB  Status:  Discontinued     2 g 150 mL/hr over 20 Minutes Intravenous Every 4 hours 08/14/17 0546 08/15/17 1112   08/14/17 0600  acyclovir (ZOVIRAX) 1,000 mg in dextrose 5 % 150 mL IVPB  Status:  Discontinued     1,000 mg 170 mL/hr over 60 Minutes Intravenous Every 8 hours 08/14/17 0546 08/15/17 1112   08/14/17 0600  vancomycin (VANCOCIN) 1,250 mg in sodium chloride 0.9 % 250 mL IVPB  Status:  Discontinued     1,250 mg 166.7 mL/hr over 90 Minutes Intravenous Every 12 hours 08/14/17 0550 08/17/17 1531   08/14/17 0600  ceFEPIme (MAXIPIME) 2 g in dextrose 5 % 50 mL IVPB     2 g 100 mL/hr over 30 Minutes Intravenous Every 8 hours 08/14/17 0555 08/21/17 2359   08/14/17 0345  vancomycin (VANCOCIN) IVPB 1000 mg/200 mL premix  Status:  Discontinued     1,000 mg 200 mL/hr over 60 Minutes Intravenous  Once 08/14/17 0331 08/14/17 0550   08/14/17 0345  ampicillin  (OMNIPEN) 2 g in sodium chloride 0.9 % 50 mL IVPB     2 g 150 mL/hr over 20 Minutes Intravenous  Once 08/14/17 0331 08/14/17 0607   08/14/17 0345  cefTRIAXone (ROCEPHIN) 1 g in dextrose 5 % 50 mL IVPB     1 g 100 mL/hr over 30 Minutes Intravenous  Once 08/14/17 0331 08/14/17 0544   08/14/17 0145  cefTRIAXone (ROCEPHIN) 1 g in dextrose 5 % 50 mL IVPB     1 g 100 mL/hr over 30 Minutes Intravenous  Once 08/14/17 0135 08/14/17 0246   08/14/17 0145  azithromycin (ZITHROMAX) 500 mg in dextrose 5 % 250 mL IVPB     500 mg 250 mL/hr over 60 Minutes Intravenous  Once 08/14/17 0135 08/14/17 0349        Objective:   Vitals:   08/19/17 0800 08/19/17 0900 08/19/17 1103 08/19/17  1127  BP: (!) 148/68 (!) 157/95 131/90   Pulse: (!) 53 79  89  Resp: '19 19  20  ' Temp: 97.8 F (36.6 C)   98 F (36.7 C)  TempSrc: Oral   Oral  SpO2: 94% 95%  96%  Weight:    100.6 kg (221 lb 12.5 oz)  Height:    6' (1.829 m)    Wt Readings from Last 3 Encounters:  08/19/17 100.6 kg (221 lb 12.5 oz)  08/11/17 98.2 kg (216 lb 6.4 oz)  07/29/17 97.2 kg (214 lb 3.2 oz)     Intake/Output Summary (Last 24 hours) at 08/19/2017 1603 Last data filed at 08/19/2017 0800 Gross per 24 hour  Intake 290 ml  Output 2600 ml  Net -2310 ml     Physical Exam  Gen:- Awake Alert,  Able to speak in sentences HEENT:- [place].AT, No sclera icterus, [place] 3 L/min Neck-Supple Neck,No JVD,.  Lungs-diminished breath sounds with scattered wheezes CV- S1, S2 normal Abd-  +ve B.Sounds, Abd Soft, No tenderness,    Extremity/Skin:- No  Edema, bilateral feet deformities, ulnar deviation and rheumatoid arthritis related deformities of upper extremities,  Psych-affect is appropriate Neuro-no tremors, no new focal deficits, generalized weakness noted    Data Review:   Micro Results Recent Results (from the past 240 hour(s))  Culture, blood (Routine x 2)     Status: None   Collection Time: 08/14/17  1:18 AM  Result Value Ref Range Status    Specimen Description BLOOD RIGHT ANTECUBITAL  Final   Special Requests   Final    BOTTLES DRAWN AEROBIC AND ANAEROBIC Blood Culture adequate volume   Culture NO GROWTH 5 DAYS  Final   Report Status 08/19/2017 FINAL  Final  Culture, blood (Routine x 2)     Status: None   Collection Time: 08/14/17  1:22 AM  Result Value Ref Range Status   Specimen Description BLOOD LEFT ANTECUBITAL  Final   Special Requests   Final    BOTTLES DRAWN AEROBIC AND ANAEROBIC Blood Culture adequate volume   Culture NO GROWTH 5 DAYS  Final   Report Status 08/19/2017 FINAL  Final  Urine culture     Status: None   Collection Time: 08/14/17  1:46 AM  Result Value Ref Range Status   Specimen Description URINE, CATHETERIZED  Final   Special Requests NONE  Final   Culture NO GROWTH  Final   Report Status 08/15/2017 FINAL  Final  CSF culture     Status: None   Collection Time: 08/14/17  4:56 AM  Result Value Ref Range Status   Specimen Description CSF  Final   Special Requests NONE  Final   Gram Stain   Final    WBC PRESENT, PREDOMINANTLY MONONUCLEAR NO ORGANISMS SEEN CYTOSPIN SMEAR    Culture NO GROWTH 3 DAYS  Final   Report Status 08/17/2017 FINAL  Final  MRSA PCR Screening     Status: None   Collection Time: 08/14/17  7:24 AM  Result Value Ref Range Status   MRSA by PCR NEGATIVE NEGATIVE Final    Comment:        The GeneXpert MRSA Assay (FDA approved for NASAL specimens only), is one component of a comprehensive MRSA colonization surveillance program. It is not intended to diagnose MRSA infection nor to guide or monitor treatment for MRSA infections.   C difficile quick scan w PCR reflex     Status: None   Collection Time: 08/14/17 12:29 PM  Result Value Ref Range Status   C Diff antigen NEGATIVE NEGATIVE Final   C Diff toxin NEGATIVE NEGATIVE Final   C Diff interpretation No C. difficile detected.  Final    Radiology Reports Ct Abdomen Pelvis Wo Contrast  Result Date:  08/14/2017 CLINICAL DATA:  Acute respiratory illness with sepsis EXAM: CT CHEST, ABDOMEN AND PELVIS WITHOUT CONTRAST TECHNIQUE: Multidetector CT imaging of the chest, abdomen and pelvis was performed following the standard protocol without IV contrast. COMPARISON:  08/14/2017, 10/11/2009 FINDINGS: CT CHEST FINDINGS Cardiovascular: Limited without intravenous contrast. Moderate-to-marked atherosclerotic calcification of the aorta. No aneurysmal dilatation. Post CABG changes. Coronary artery calcification. Heart size upper normal. No pericardial effusion. Mediastinum/Nodes: Endotracheal tube tip terminate just above the carina. Esophageal tube tip is within the gastric outlet. No thyroid mass. Nonspecific subcentimeter mediastinal lymph nodes. Lungs/Pleura: Trace pleural effusion. Atelectasis within the posterior lung bases. Moderate to marked emphysema with large bulla in the upper lobes. Negative for a pneumothorax. Musculoskeletal: Postsurgical changes of the sternum. Degenerative changes of the spine. No acute or suspicious lesion CT ABDOMEN PELVIS FINDINGS Hepatobiliary: Surgical clips in the gallbladder fossa. No focal hepatic abnormality or biliary dilatation. Suspected small stones in the distal common bile duct, measuring up to 7 mm. Pancreas: Unremarkable. No pancreatic ductal dilatation or surrounding inflammatory changes. Spleen: Normal in size without focal abnormality. Adrenals/Urinary Tract: Adrenal glands are within normal limits. No hydronephrosis. Nonspecific perinephric fat stranding. Multiple right greater than left intrarenal calculi, largest stone on the right is seen in the upper pole and measures 6 mm. Largest stone on the left is seen in the upper pole and measures 5 mm. Foley catheter and air in the empty bladder. Stomach/Bowel: Stomach is within normal limits. Appendix appears normal. No evidence of bowel wall thickening, distention, or inflammatory changes. Vascular/Lymphatic: Moderate to  marked atherosclerotic vascular disease. No aneurysmal dilatation. No significant abdominal or pelvic adenopathy Reproductive: Prostate is unremarkable. Other: Small fat in the inguinal canals. No free air or free fluid. Small fat in the umbilicus. Musculoskeletal: Degenerative changes. No acute or suspicious abnormality IMPRESSION: 1. Support lines and tubes as above. Tip of the endotracheal tube terminates just above the carina. 2. Trace pleural effusions and atelectasis in the bilateral lung bases. Moderate severe emphysema 3. No CT evidence for acute intra-abdominal or pelvic abnormality 4. Status post cholecystectomy. Suspect that there is choledocholithiasis. No significant biliary dilatation. 5. Multiple bilateral nonobstructing stones within the kidneys. Electronically Signed   By: Donavan Foil M.D.   On: 08/14/2017 20:59   Ct Head Wo Contrast  Result Date: 08/14/2017 CLINICAL DATA:  Altered mental status and fever EXAM: CT HEAD WITHOUT CONTRAST TECHNIQUE: Contiguous axial images were obtained from the base of the skull through the vertex without intravenous contrast. COMPARISON:  Head CT April 25, 2006; brain MRI April 27, 2006 FINDINGS: Brain: The ventricles are normal in size and configuration. There is no appreciable intracranial mass, hemorrhage, extra-axial fluid collection, or midline shift. There is an age uncertain infarct in the anterior limb of the left internal capsule. Elsewhere gray-white compartments appear normal. Vascular: No hyperdense vessel. There is calcification in each carotid siphon region. Skull: Bony calvarium appears intact. Sinuses/Orbits: There is extensive opacification of most of the right frontal sinus. There is opacification of multiple ethmoid air cells bilaterally. There is opacification of much of the right maxillary antrum with an air-fluid level on the right. There is moderate mucosal thickening in the left maxillary antrum. There is air-fluid level in  the  right sphenoid sinus. Orbits appear symmetric bilaterally. Other: Mastoid air cells are clear. IMPRESSION: 1. Age uncertain and possibly recent infarct involving the anterior limb of the left internal capsule. Elsewhere gray-white compartments appear normal. No mass or hemorrhage. 2. Multifocal paranasal sinus disease with air-fluid levels in the right sphenoid and right maxillary sinuses. 3.  Foci of arterial vascular calcification noted. Electronically Signed   By: Lowella Grip III M.D.   On: 08/14/2017 02:53   Ct Chest Wo Contrast  Result Date: 08/14/2017 CLINICAL DATA:  Acute respiratory illness with sepsis EXAM: CT CHEST, ABDOMEN AND PELVIS WITHOUT CONTRAST TECHNIQUE: Multidetector CT imaging of the chest, abdomen and pelvis was performed following the standard protocol without IV contrast. COMPARISON:  08/14/2017, 10/11/2009 FINDINGS: CT CHEST FINDINGS Cardiovascular: Limited without intravenous contrast. Moderate-to-marked atherosclerotic calcification of the aorta. No aneurysmal dilatation. Post CABG changes. Coronary artery calcification. Heart size upper normal. No pericardial effusion. Mediastinum/Nodes: Endotracheal tube tip terminate just above the carina. Esophageal tube tip is within the gastric outlet. No thyroid mass. Nonspecific subcentimeter mediastinal lymph nodes. Lungs/Pleura: Trace pleural effusion. Atelectasis within the posterior lung bases. Moderate to marked emphysema with large bulla in the upper lobes. Negative for a pneumothorax. Musculoskeletal: Postsurgical changes of the sternum. Degenerative changes of the spine. No acute or suspicious lesion CT ABDOMEN PELVIS FINDINGS Hepatobiliary: Surgical clips in the gallbladder fossa. No focal hepatic abnormality or biliary dilatation. Suspected small stones in the distal common bile duct, measuring up to 7 mm. Pancreas: Unremarkable. No pancreatic ductal dilatation or surrounding inflammatory changes. Spleen: Normal in size without  focal abnormality. Adrenals/Urinary Tract: Adrenal glands are within normal limits. No hydronephrosis. Nonspecific perinephric fat stranding. Multiple right greater than left intrarenal calculi, largest stone on the right is seen in the upper pole and measures 6 mm. Largest stone on the left is seen in the upper pole and measures 5 mm. Foley catheter and air in the empty bladder. Stomach/Bowel: Stomach is within normal limits. Appendix appears normal. No evidence of bowel wall thickening, distention, or inflammatory changes. Vascular/Lymphatic: Moderate to marked atherosclerotic vascular disease. No aneurysmal dilatation. No significant abdominal or pelvic adenopathy Reproductive: Prostate is unremarkable. Other: Small fat in the inguinal canals. No free air or free fluid. Small fat in the umbilicus. Musculoskeletal: Degenerative changes. No acute or suspicious abnormality IMPRESSION: 1. Support lines and tubes as above. Tip of the endotracheal tube terminates just above the carina. 2. Trace pleural effusions and atelectasis in the bilateral lung bases. Moderate severe emphysema 3. No CT evidence for acute intra-abdominal or pelvic abnormality 4. Status post cholecystectomy. Suspect that there is choledocholithiasis. No significant biliary dilatation. 5. Multiple bilateral nonobstructing stones within the kidneys. Electronically Signed   By: Donavan Foil M.D.   On: 08/14/2017 20:59   Dg Chest Port 1 View  Result Date: 08/17/2017 CLINICAL DATA:  Respiratory failure EXAM: PORTABLE CHEST 1 VIEW COMPARISON:  Yesterday FINDINGS: Fairly stable inflation after extubation yesterday. There is interstitial opacity attributed to patient's severe emphysema, with thick wall bullae bilaterally. No acute opacity when compared to remote studies. No effusion or pneumothorax. Left IJ line with tip at the SVC level. IMPRESSION: 1. No acute finding after extubation. 2. COPD. Electronically Signed   By: Monte Fantasia M.D.   On:  08/17/2017 06:51   Dg Chest Port 1 View  Result Date: 08/16/2017 CLINICAL DATA:  Respiratory failure EXAM: PORTABLE CHEST 1 VIEW COMPARISON:  Yesterday FINDINGS: Endotracheal tube tip between the clavicular heads and  carina. Left IJ line with tip at the upper SVC. An orogastric tube reaches the stomach at least. Emphysema with thick walled bullae apically. Reticular opacities at the bases attributed to bronchitis and atelectasis based on CT yesterday. No edema, effusion, or pneumothorax. Cardiomegaly. Status post CABG. IMPRESSION: 1. Stable positioning of tubes and central line. 2. COPD. Electronically Signed   By: Monte Fantasia M.D.   On: 08/16/2017 07:01   Dg Chest Port 1 View  Result Date: 08/15/2017 CLINICAL DATA:  Respiratory failure EXAM: PORTABLE CHEST 1 VIEW COMPARISON:  Chest x-rays dated 08/14/2017 and 08/13/2017. Comparison chest x-ray dated 03/07/2016. FINDINGS: Endotracheal tube is well positioned with tip just above the level of the carina. Left IJ central line is stable in position with tip at the level of the upper/mid SVC. Enteric tube passes below the diaphragm. Cardiomediastinal silhouette is stable. Atherosclerotic changes noted at the aortic arch. Median sternotomy wires appear intact and stable in alignment. Improved aeration at each lung base compared to yesterday's study suggesting improved fluid status. Coarse lung markings persist at each lung base, stable compared to the older study of 03/07/2016 indicating chronic interstitial lung disease/fibrosis. IMPRESSION: 1. Improved aeration at each lung base compared to yesterday's study suggesting improved fluid status and/or resolved atelectasis. No new lung findings. 2. Coarse lung markings at each lung base, stable compared to the older study of 03/07/2016 indicating some degree of chronic interstitial lung disease/fibrosis. 3. Support apparatus is stable in position. 4. Aortic atherosclerosis. Electronically Signed   By: Franki Cabot M.D.   On: 08/15/2017 07:14   Dg Chest Port 1 View  Result Date: 08/14/2017 CLINICAL DATA:  Intubated EXAM: PORTABLE CHEST 1 VIEW COMPARISON:  08/14/2017, 02/10/2017 FINDINGS: Left-sided central venous catheter overlies the SVC origin. Interval intubation, tip of the endotracheal tube is about 3.3 cm superior to the carina. Esophageal tube extends below diaphragm but the tip is non included. Post sternotomy changes. Worsening interstitial and alveolar opacity in the bilateral lung bases. Emphysematous disease and scarring. Stable cardiomediastinal silhouette with atherosclerosis. No pneumothorax. IMPRESSION: 1. Endotracheal tube tip about 3.3 cm superior to carina. Esophageal tube extends below diaphragm but the tip is non included 2. Worsening interstitial and alveolar opacity at the bilateral lung bases which may reflect pulmonary edema or pneumonia 3. Emphysema Electronically Signed   By: Donavan Foil M.D.   On: 08/14/2017 15:51   Dg Chest Port 1 View  Result Date: 08/14/2017 CLINICAL DATA:  Central line placement EXAM: PORTABLE CHEST 1 VIEW COMPARISON:  08/14/2017 FINDINGS: Left jugular central venous catheter with the tip projecting over the SVC. Bilateral chronic diffuse interstitial thickening. Bilateral bullous disease. No focal consolidation or pneumothorax. Cardiomediastinal silhouette is stable. Prior CABG. IMPRESSION: 1. Left jugular central venous catheter with the tip projecting over the SVC. 2. Bilateral bullous disease. Electronically Signed   By: Kathreen Devoid   On: 08/14/2017 10:10   Dg Chest Port 1 View  Result Date: 08/14/2017 CLINICAL DATA:  Altered mental status. EXAM: PORTABLE CHEST 1 VIEW COMPARISON:  Most recent radiographs 02/10/2017. Most recent CT 02/23/2016 FINDINGS: Post median sternotomy and CABG. Mild cardiomegaly. Unchanged mediastinal contours. Emphysema. Bleb in the left upper lobe. Scarring in the periphery of the right upper lobe. No consolidation, pleural  fluid or pneumothorax. No pulmonary edema. IMPRESSION: 1. No acute abnormality. 2. Emphysema with multifocal scarring. 3. Stable mild cardiomegaly. Electronically Signed   By: Jeb Levering M.D.   On: 08/14/2017 01:48   Dg Abd Portable  1v  Result Date: 08/14/2017 CLINICAL DATA:  66 year old male enteric tube placement. EXAM: PORTABLE ABDOMEN - 1 VIEW COMPARISON:  CT Abdomen and Pelvis 10/11/2009. Portable chest radiographs today and earlier. FINDINGS: Portable AP semi upright view at 1538 hours. Enteric tube courses to the stomach. The side holes at the low level of the distal gastric body. There is a paucity of other upper abdominal bowel gas. Cholecystectomy clips are new since 2011. Stable visible chest. IMPRESSION: Enteric tube placed into the stomach, side hole up the level of the distal gastric body. Electronically Signed   By: Genevie Ann M.D.   On: 08/14/2017 15:57     CBC Recent Labs  Lab 08/14/17 0121 08/14/17 0902 08/14/17 1700 08/15/17 0517 08/16/17 0511 08/17/17 0430 08/18/17 0511  WBC 5.0 5.4 8.2 9.1 5.4 5.5 3.9*  HGB 14.6 11.9* 13.1 11.9* 11.1* 11.0* 10.1*  HCT 42.9 35.4* 39.2 34.7* 33.1* 32.8* 31.0*  PLT 119* 97* 96* 93* 70* 70* 79*  MCV 89.7 88.5 90.3 87.4 87.6 87.9 89.6  MCH 30.5 29.8 30.2 30.0 29.4 29.5 29.2  MCHC 34.0 33.6 33.4 34.3 33.5 33.5 32.6  RDW 16.1* 15.9* 16.2* 16.5* 16.1* 16.2* 16.4*  LYMPHSABS 3.3 2.5  --   --  0.6*  --   --   MONOABS 0.5 1.2*  --   --  0.6  --   --   EOSABS 0.0 0.0  --   --  0.0  --   --   BASOSABS 0.0 0.0  --   --  0.0  --   --     Chemistries  Recent Labs  Lab 08/14/17 0121  08/15/17 0517 08/16/17 0511 08/16/17 1815 08/17/17 0430 08/17/17 1107 08/18/17 0511 08/19/17 0947  NA 132*   < > 136 141 139  --  135 138 139  K 2.8*   < > 2.9* 2.1* 2.9*  --  2.7* 2.9* 2.6*  CL 96*   < > 105 104 100*  --  99* 100* 99*  CO2 22   < > '22 27 28  ' --  '27 30 29  ' GLUCOSE 154*   < > 387* 267* 242*  --  237* 223* 176*  BUN 11   < > '12 12 12   ' --  '10 11 12  ' CREATININE 0.82   < > 0.81 0.62 0.63  --  0.64 0.63 0.68  CALCIUM 8.9   < > 7.0* 7.5* 7.5*  --  7.4* 8.2* 8.4*  MG  --   --  1.3* 2.0 1.8 2.4  --  2.0  --   AST 63*  --   --   --   --   --   --   --   --   ALT 42  --   --   --   --   --   --   --   --   ALKPHOS 81  --   --   --   --   --   --   --   --   BILITOT 1.5*  --   --   --   --   --   --   --   --    < > = values in this interval not displayed.   ------------------------------------------------------------------------------------------------------------------ No results for input(s): CHOL, HDL, LDLCALC, TRIG, CHOLHDL, LDLDIRECT in the last 72 hours.  Lab Results  Component Value Date   HGBA1C 6.1 (H) 08/15/2017   ------------------------------------------------------------------------------------------------------------------ No  results for input(s): TSH, T4TOTAL, T3FREE, THYROIDAB in the last 72 hours.  Invalid input(s): FREET3 ------------------------------------------------------------------------------------------------------------------ No results for input(s): VITAMINB12, FOLATE, FERRITIN, TIBC, IRON, RETICCTPCT in the last 72 hours.  Coagulation profile Recent Labs  Lab 08/14/17 0121  INR 1.13    No results for input(s): DDIMER in the last 72 hours.  Cardiac Enzymes Recent Labs  Lab 08/14/17 1337 08/14/17 1907 08/15/17 0151  TROPONINI 0.67* 0.96* 1.20*   ------------------------------------------------------------------------------------------------------------------    Component Value Date/Time   BNP 51.7 01/20/2016 0308     Edyn Popoca M.D on 08/19/2017 at 4:03 PM  Between 7am to 7pm - Pager - [phone]  After 7pm go to [email or web address] - password [email or web address]  [facility] -  Office  [phone]   Voice Recognition Viviann Spare dictation system was used to create this note, attempts have been made to correct errors. Please contact the author with questions and/or clarifications.

## 2017-08-19 NOTE — Progress Notes (Signed)
NURSING PROGRESS NOTE  Joseph [name] [id number] Transfer Data: 08/19/2017 11:13 AM Attending Provider: Shon Hale, MD PCP:Paz, Nolon Rod, MD Code Status: DNR   Joseph [name] is a 66 y.o. male patient transferred from [facility]M  -No acute distress noted.  -No complaints of shortness of breath.  -No complaints of chest pain.   Cardiac Monitoring: Box # 02 in place. Cardiac monitor yields:SR with PVC's  Blood pressure (!) 157/95, pulse 79, temperature 97.8 F (36.6 C), temperature source Oral, resp. rate 19, height 6' (1.829 m), weight 97.8 kg (215 lb 9.8 oz), SpO2 95 %.   IV Fluids:  IV in place, occlusive dsg intact without redness, saline locker.   Allergies:  Patient has no known allergies.  Past Medical History:   has a past medical history of CAD (coronary artery disease), Cataracts, bilateral, Complication of anesthesia, COPD (chronic obstructive pulmonary disease) (HCC), Diabetes mellitus, Esophageal dysmotility, Fatty liver (10/11/09), Gastroesophageal reflux disease, Gastroparesis, Hiatal hernia, Hypertension, Neuropathy, Onychomycosis, Osteoporosis, Oxygen dependent, Peripheral polyneuropathy, Psoriasis, and Rheumatoid arthritis (HCC).  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  Social History:   reports that he quit smoking about 11 years ago. He has a 40.00 pack-year smoking history. He quit smokeless tobacco use about 11 years ago. He reports that he does not drink alcohol or use drugs.  Skin: Intact  Patient/Family orientated to room. Information packet given to  patient/family. Admission inpatient armband information verified with patient/family to include name and date of birth and placed on patient arm. Side rails up x 2, fall assessment and education completed with patient/family. Patient/family able to verbalize understanding of risk associated with falls and verbalized understanding to call for assistance before getting out of bed. Call light within reach. Patient/family able to voice and demonstrate understanding of unit orientation instructions.    Received a call from Lab with critical value K+ 2.6, notified MD of value. Will continue to evaluate and treat per MD orders.

## 2017-08-20 LAB — BASIC METABOLIC PANEL
Anion gap: 11 (ref 5–15)
BUN: 12 mg/dL (ref 6–20)
CALCIUM: 8.6 mg/dL — AB (ref 8.9–10.3)
CO2: 24 mmol/L (ref 22–32)
CREATININE: 0.63 mg/dL (ref 0.61–1.24)
Chloride: 105 mmol/L (ref 101–111)
GFR calc Af Amer: >60 mL/min (ref >60)
GFR calc non Af Amer: >60 mL/min (ref >60)
GLUCOSE: 160 mg/dL — AB (ref 65–99)
Potassium: 3.3 mmol/L — ABNORMAL LOW (ref 3.5–5.1)
Sodium: 140 mmol/L (ref 135–145)

## 2017-08-20 LAB — GLUCOSE, CAPILLARY
GLUCOSE-CAPILLARY: 180 mg/dL — AB (ref 65–99)
Glucose-Capillary: 85 mg/dL (ref 65–99)
Glucose-Capillary: 146 mg/dL — ABNORMAL HIGH (ref 65–99)
Glucose-Capillary: 123 mg/dL — ABNORMAL HIGH (ref 65–99)

## 2017-08-20 MED ORDER — POTASSIUM CHLORIDE CRYS ER 20 MEQ PO TBCR
40.0000 meq | EXTENDED_RELEASE_TABLET | Freq: Once | ORAL | Status: AC
  Administered 2017-08-20: 40 meq via ORAL
  Filled 2017-08-20: qty 2

## 2017-08-20 MED ORDER — IPRATROPIUM-ALBUTEROL 0.5-2.5 (3) MG/3ML IN SOLN: 3.0000 mL | RESPIRATORY_TRACT | Status: DC | PRN

## 2017-08-20 MED ORDER — POTASSIUM CHLORIDE CRYS ER 20 MEQ PO TBCR
40.0000 meq | EXTENDED_RELEASE_TABLET | Freq: Every day | ORAL | Status: DC
  Administered 2017-08-21 – 2017-08-22 (×2): 40 meq via ORAL
  Filled 2017-08-20 (×2): qty 2

## 2017-08-20 NOTE — Progress Notes (Signed)
Patient Demographics:    Joseph [name], is a 66 y.o. male, DOB - May 05, 1951, YKD:[id number]  Admit date - 08/14/2017   Admitting Physician Charlesetta Garibaldi, MD  Outpatient Primary MD for the patient is Colon Branch, MD  LOS - 6   Chief Complaint  Patient presents with  . 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  H/o rheumatoid arthritis (on daily prednisone and methotrexate), COPD/ bronchiectasis on 3 L oxygen, diabetes, GERD/Barretts, recurrent pneumonia secondary to chronic aspiration/reflux, and coronary artery disease. Pt had a URI/sinus symptoms  presented to pulmonary clinic for this and was written a prescription for Augmentin. His symptoms progressed and he developed high fevers (106), diarrhea, headache, malaise.  Symptoms were felt to be infectious in origin and potentially meningitis. He had LP done in the emergency department and was started on broad-spectrum antibiotic. He was hypotensive despite IV fluid bolus and PCCM admitted him on 08/14/17, transferred to [facility] on 08/19/17   Plan:- 1)Sepsis of Unknown origin-clinically much improved, no altered mentation, no fevers, patient met  criteria for septic shock on admission  with fevers and persistent hypotension and elevated lactic acid levels despite IV fluid boluses, pro calcitonin was elevated, plan is to complete cefepime for 8 days (last dose 08/21/17), vancomycin has been discontinued, MRSA screen is negative, stool for C. difficile negative, CSF Gram stain and culture negative so far.  Urine and blood cultures are negative  2)Acute on chronic Hypoxic Respiratory failure-overall much improved respiratory symptoms, oxygen requirement back to baseline , at baseline patient has COPD/bronchiectasis with chronic hypoxic respiratory failure, cefepime as above to cover for possible Pseudomonas patient with bronchiectasis, continue bronchodilators mucolytics and supplemental oxygen  3) history of rheumatoid arthritis/immunosuppressant use-methotrexate on hold, continue prednisone 30 mg daily, patient previously completed a stress dose hydrocortisone, follow-up with rheumatology please discharge to restart methotrexate once acute infectious process has resolved  4)HFpEF-last known EF 55 to 60%, patient has a history of chronic diastolic dysfunction CHF, appears compensated, c/n Lasix 40 mg daily, monitor fluid input, output and daily weights as well as electrolytes closely  5)FEN-hypokalemia, most likely worsened by diuretic use, replace and recheck  6)DM-stable, last A1c 6.1, continue Lantus insulin 10 units daily along with sliding scale coverage  7)Disposition/Generalized Weakness/Debility-  physical therapy and OT evaluation appreciated, recommend Home health PT with eventual transition to pulmonary rehab.,   Code Status : DNR  Disposition Plan  : Home health PT with eventual transition to pulmonary rehab. Consults  :  PCCM, PT/OT  DVT Prophylaxis  :  SCDs (low Platelets)  Lab Results  Component Value Date   PLT 79 (L) 08/18/2017    Inpatient Medications  Scheduled Meds: . amitriptyline  10 mg Oral QHS  . azelastine  2 spray Each Nare BID  .  dextromethorphan-guaiFENesin  1 tablet Oral BID  . fluticasone  2 spray Each Nare BID  . fluticasone furoate-vilanterol  1 puff Inhalation Daily   And  . umeclidinium bromide  1 puff Inhalation Daily  . furosemide  40 mg Intravenous Daily  . insulin aspart  0-20 Units Subcutaneous TID WC  . insulin glargine  10 Units Subcutaneous Daily  . pantoprazole  40 mg Oral Daily  . potassium chloride  40 mEq Oral Once  . potassium chloride  40 mEq Oral Once  . [START ON 08/21/2017] potassium chloride  40 mEq Oral Daily  . predniSONE  30 mg Oral Q breakfast  . pregabalin  75 mg Oral BID  . zolpidem  10 mg Oral QHS   Continuous Infusions: . sodium chloride    . ceFEPime (MAXIPIME) IV Stopped (08/20/17 1329)   PRN Meds:.[CANCELED] Place/Maintain arterial line **AND** sodium chloride, acetaminophen, benzonatate, ipratropium-albuterol, ondansetron (ZOFRAN) IV, sennosides    Anti-infectives (From admission, onward)   Start     Dose/Rate Route Frequency Ordered Stop   08/14/17 2200  azithromycin (ZITHROMAX) 500 mg in dextrose 5 % 250 mL IVPB  Status:  Discontinued     500 mg 250 mL/hr over 60 Minutes Intravenous Every 24 hours 08/14/17 0546 08/15/17 1112   08/14/17 2000  cefTRIAXone (ROCEPHIN) 2 g in dextrose 5 % 50 mL IVPB  Status:  Discontinued     2 g 100 mL/hr over 30 Minutes Intravenous Every 12 hours 08/14/17 0546 08/14/17 0554   08/14/17 0800  ampicillin (OMNIPEN) 2 g in sodium chloride 0.9 % 50 mL IVPB  Status:  Discontinued     2 g 150 mL/hr over 20 Minutes Intravenous Every 4 hours 08/14/17 0546 08/15/17 1112   08/14/17 0600  acyclovir (ZOVIRAX) 1,000 mg in dextrose 5 % 150 mL IVPB  Status:  Discontinued     1,000 mg 170 mL/hr over 60 Minutes Intravenous Every 8 hours 08/14/17 0546 08/15/17 1112   08/14/17 0600  vancomycin (VANCOCIN) 1,250 mg in sodium chloride 0.9 % 250 mL IVPB  Status:  Discontinued     1,250 mg 166.7 mL/hr over 90 Minutes Intravenous Every 12 hours 08/14/17 0550  08/17/17 1531   08/14/17 0600  ceFEPIme (MAXIPIME) 2 g in dextrose 5 % 50 mL IVPB     2 g 100 mL/hr over 30 Minutes Intravenous Every 8 hours 08/14/17 0555 08/21/17 2359   08/14/17 0345  vancomycin (VANCOCIN) IVPB 1000 mg/200 mL premix  Status:  Discontinued     1,000 mg 200 mL/hr over 60 Minutes Intravenous  Once 08/14/17 0331 08/14/17 0550   08/14/17 0345  ampicillin (OMNIPEN) 2 g in sodium chloride 0.9 % 50 mL IVPB     2 g 150 mL/hr over 20 Minutes Intravenous  Once 08/14/17 0331 08/14/17 0607   08/14/17 0345  cefTRIAXone (ROCEPHIN) 1 g in dextrose 5 % 50 mL IVPB     1 g 100 mL/hr over 30 Minutes Intravenous  Once 08/14/17 0331 08/14/17 0544   08/14/17 0145  cefTRIAXone (ROCEPHIN) 1 g in dextrose 5 % 50 mL IVPB     1 g 100 mL/hr over 30 Minutes Intravenous  Once 08/14/17 0135 08/14/17 0246   08/14/17  0145  azithromycin (ZITHROMAX) 500 mg in dextrose 5 % 250 mL IVPB     500 mg 250 mL/hr over 60 Minutes Intravenous  Once 08/14/17 0135 08/14/17 0349        Objective:   Vitals:   08/20/17 0532 08/20/17 0752 08/20/17 1209 08/20/17 1612  BP: (!) 168/93 (!) 134/91 140/76 (!) 153/78  Pulse: (!) 53 91 93 84  Resp: '18 16 15 20  ' Temp: 98.5 F (36.9 C) 97.7 F (36.5 C) 98 F (36.7 C) 98.1 F (36.7 C)  TempSrc: Oral Oral Oral Axillary  SpO2: 98% 98% 97% 98%  Weight: 101.6 kg (224 lb)     Height:        Wt Readings from Last 3 Encounters:  08/20/17 101.6 kg (224 lb)  08/11/17 98.2 kg (216 lb 6.4 oz)  07/29/17 97.2 kg (214 lb 3.2 oz)     Intake/Output Summary (Last 24 hours) at 08/20/2017 1930 Last data filed at 08/20/2017 1812 Gross per 24 hour  Intake 2530 ml  Output 2200 ml  Net 330 ml    Physical Exam  Gen:- Awake Alert,  Able to speak in sentences HEENT:- [place].AT, No sclera icterus, [place] 3 L/min Neck-Supple Neck,No JVD,.  Lungs-diminished breath sounds with scattered wheezes CV- S1, S2 normal Abd-  +ve B.Sounds, Abd Soft, No tenderness,    Extremity/Skin:- No   Edema, bilateral feet deformities, ulnar deviation and rheumatoid arthritis related deformities of upper extremities,  Psych-affect is appropriate Neuro-no tremors, no new focal deficits, generalized weakness noted   Data Review:   Micro Results Recent Results (from the past 240 hour(s))  Culture, blood (Routine x 2)     Status: None   Collection Time: 08/14/17  1:18 AM  Result Value Ref Range Status   Specimen Description BLOOD RIGHT ANTECUBITAL  Final   Special Requests   Final    BOTTLES DRAWN AEROBIC AND ANAEROBIC Blood Culture adequate volume   Culture NO GROWTH 5 DAYS  Final   Report Status 08/19/2017 FINAL  Final  Culture, blood (Routine x 2)     Status: None   Collection Time: 08/14/17  1:22 AM  Result Value Ref Range Status   Specimen Description BLOOD LEFT ANTECUBITAL  Final   Special Requests   Final    BOTTLES DRAWN AEROBIC AND ANAEROBIC Blood Culture adequate volume   Culture NO GROWTH 5 DAYS  Final   Report Status 08/19/2017 FINAL  Final  Urine culture     Status: None   Collection Time: 08/14/17  1:46 AM  Result Value Ref Range Status   Specimen Description URINE, CATHETERIZED  Final   Special Requests NONE  Final   Culture NO GROWTH  Final   Report Status 08/15/2017 FINAL  Final  CSF culture     Status: None   Collection Time: 08/14/17  4:56 AM  Result Value Ref Range Status   Specimen Description CSF  Final   Special Requests NONE  Final   Gram Stain   Final    WBC PRESENT, PREDOMINANTLY MONONUCLEAR NO ORGANISMS SEEN CYTOSPIN SMEAR    Culture NO GROWTH 3 DAYS  Final   Report Status 08/17/2017 FINAL  Final  MRSA PCR Screening     Status: None   Collection Time: 08/14/17  7:24 AM  Result Value Ref Range Status   MRSA by PCR NEGATIVE NEGATIVE Final    Comment:        The GeneXpert MRSA Assay (FDA approved for NASAL specimens only),  is one component of a comprehensive MRSA colonization surveillance program. It is not intended to diagnose  MRSA infection nor to guide or monitor treatment for MRSA infections.   C difficile quick scan w PCR reflex     Status: None   Collection Time: 08/14/17 12:29 PM  Result Value Ref Range Status   C Diff antigen NEGATIVE NEGATIVE Final   C Diff toxin NEGATIVE NEGATIVE Final   C Diff interpretation No C. difficile detected.  Final    Radiology Reports Ct Abdomen Pelvis Wo Contrast  Result Date: 08/14/2017 CLINICAL DATA:  Acute respiratory illness with sepsis EXAM: CT CHEST, ABDOMEN AND PELVIS WITHOUT CONTRAST TECHNIQUE: Multidetector CT imaging of the chest, abdomen and pelvis was performed following the standard protocol without IV contrast. COMPARISON:  08/14/2017, 10/11/2009 FINDINGS: CT CHEST FINDINGS Cardiovascular: Limited without intravenous contrast. Moderate-to-marked atherosclerotic calcification of the aorta. No aneurysmal dilatation. Post CABG changes. Coronary artery calcification. Heart size upper normal. No pericardial effusion. Mediastinum/Nodes: Endotracheal tube tip terminate just above the carina. Esophageal tube tip is within the gastric outlet. No thyroid mass. Nonspecific subcentimeter mediastinal lymph nodes. Lungs/Pleura: Trace pleural effusion. Atelectasis within the posterior lung bases. Moderate to marked emphysema with large bulla in the upper lobes. Negative for a pneumothorax. Musculoskeletal: Postsurgical changes of the sternum. Degenerative changes of the spine. No acute or suspicious lesion CT ABDOMEN PELVIS FINDINGS Hepatobiliary: Surgical clips in the gallbladder fossa. No focal hepatic abnormality or biliary dilatation. Suspected small stones in the distal common bile duct, measuring up to 7 mm. Pancreas: Unremarkable. No pancreatic ductal dilatation or surrounding inflammatory changes. Spleen: Normal in size without focal abnormality. Adrenals/Urinary Tract: Adrenal glands are within normal limits. No hydronephrosis. Nonspecific perinephric fat stranding. Multiple  right greater than left intrarenal calculi, largest stone on the right is seen in the upper pole and measures 6 mm. Largest stone on the left is seen in the upper pole and measures 5 mm. Foley catheter and air in the empty bladder. Stomach/Bowel: Stomach is within normal limits. Appendix appears normal. No evidence of bowel wall thickening, distention, or inflammatory changes. Vascular/Lymphatic: Moderate to marked atherosclerotic vascular disease. No aneurysmal dilatation. No significant abdominal or pelvic adenopathy Reproductive: Prostate is unremarkable. Other: Small fat in the inguinal canals. No free air or free fluid. Small fat in the umbilicus. Musculoskeletal: Degenerative changes. No acute or suspicious abnormality IMPRESSION: 1. Support lines and tubes as above. Tip of the endotracheal tube terminates just above the carina. 2. Trace pleural effusions and atelectasis in the bilateral lung bases. Moderate severe emphysema 3. No CT evidence for acute intra-abdominal or pelvic abnormality 4. Status post cholecystectomy. Suspect that there is choledocholithiasis. No significant biliary dilatation. 5. Multiple bilateral nonobstructing stones within the kidneys. Electronically Signed   By: Donavan Foil M.D.   On: 08/14/2017 20:59   Ct Head Wo Contrast  Result Date: 08/14/2017 CLINICAL DATA:  Altered mental status and fever EXAM: CT HEAD WITHOUT CONTRAST TECHNIQUE: Contiguous axial images were obtained from the base of the skull through the vertex without intravenous contrast. COMPARISON:  Head CT April 25, 2006; brain MRI April 27, 2006 FINDINGS: Brain: The ventricles are normal in size and configuration. There is no appreciable intracranial mass, hemorrhage, extra-axial fluid collection, or midline shift. There is an age uncertain infarct in the anterior limb of the left internal capsule. Elsewhere gray-white compartments appear normal. Vascular: No hyperdense vessel. There is calcification in each  carotid siphon region. Skull: Bony calvarium appears intact. Sinuses/Orbits: There  is extensive opacification of most of the right frontal sinus. There is opacification of multiple ethmoid air cells bilaterally. There is opacification of much of the right maxillary antrum with an air-fluid level on the right. There is moderate mucosal thickening in the left maxillary antrum. There is air-fluid level in the right sphenoid sinus. Orbits appear symmetric bilaterally. Other: Mastoid air cells are clear. IMPRESSION: 1. Age uncertain and possibly recent infarct involving the anterior limb of the left internal capsule. Elsewhere gray-white compartments appear normal. No mass or hemorrhage. 2. Multifocal paranasal sinus disease with air-fluid levels in the right sphenoid and right maxillary sinuses. 3.  Foci of arterial vascular calcification noted. Electronically Signed   By: Lowella Grip III M.D.   On: 08/14/2017 02:53   Ct Chest Wo Contrast  Result Date: 08/14/2017 CLINICAL DATA:  Acute respiratory illness with sepsis EXAM: CT CHEST, ABDOMEN AND PELVIS WITHOUT CONTRAST TECHNIQUE: Multidetector CT imaging of the chest, abdomen and pelvis was performed following the standard protocol without IV contrast. COMPARISON:  08/14/2017, 10/11/2009 FINDINGS: CT CHEST FINDINGS Cardiovascular: Limited without intravenous contrast. Moderate-to-marked atherosclerotic calcification of the aorta. No aneurysmal dilatation. Post CABG changes. Coronary artery calcification. Heart size upper normal. No pericardial effusion. Mediastinum/Nodes: Endotracheal tube tip terminate just above the carina. Esophageal tube tip is within the gastric outlet. No thyroid mass. Nonspecific subcentimeter mediastinal lymph nodes. Lungs/Pleura: Trace pleural effusion. Atelectasis within the posterior lung bases. Moderate to marked emphysema with large bulla in the upper lobes. Negative for a pneumothorax. Musculoskeletal: Postsurgical changes of the  sternum. Degenerative changes of the spine. No acute or suspicious lesion CT ABDOMEN PELVIS FINDINGS Hepatobiliary: Surgical clips in the gallbladder fossa. No focal hepatic abnormality or biliary dilatation. Suspected small stones in the distal common bile duct, measuring up to 7 mm. Pancreas: Unremarkable. No pancreatic ductal dilatation or surrounding inflammatory changes. Spleen: Normal in size without focal abnormality. Adrenals/Urinary Tract: Adrenal glands are within normal limits. No hydronephrosis. Nonspecific perinephric fat stranding. Multiple right greater than left intrarenal calculi, largest stone on the right is seen in the upper pole and measures 6 mm. Largest stone on the left is seen in the upper pole and measures 5 mm. Foley catheter and air in the empty bladder. Stomach/Bowel: Stomach is within normal limits. Appendix appears normal. No evidence of bowel wall thickening, distention, or inflammatory changes. Vascular/Lymphatic: Moderate to marked atherosclerotic vascular disease. No aneurysmal dilatation. No significant abdominal or pelvic adenopathy Reproductive: Prostate is unremarkable. Other: Small fat in the inguinal canals. No free air or free fluid. Small fat in the umbilicus. Musculoskeletal: Degenerative changes. No acute or suspicious abnormality IMPRESSION: 1. Support lines and tubes as above. Tip of the endotracheal tube terminates just above the carina. 2. Trace pleural effusions and atelectasis in the bilateral lung bases. Moderate severe emphysema 3. No CT evidence for acute intra-abdominal or pelvic abnormality 4. Status post cholecystectomy. Suspect that there is choledocholithiasis. No significant biliary dilatation. 5. Multiple bilateral nonobstructing stones within the kidneys. Electronically Signed   By: Donavan Foil M.D.   On: 08/14/2017 20:59   Dg Chest Port 1 View  Result Date: 08/17/2017 CLINICAL DATA:  Respiratory failure EXAM: PORTABLE CHEST 1 VIEW COMPARISON:   Yesterday FINDINGS: Fairly stable inflation after extubation yesterday. There is interstitial opacity attributed to patient's severe emphysema, with thick wall bullae bilaterally. No acute opacity when compared to remote studies. No effusion or pneumothorax. Left IJ line with tip at the SVC level. IMPRESSION: 1. No acute finding after  extubation. 2. COPD. Electronically Signed   By: Monte Fantasia M.D.   On: 08/17/2017 06:51   Dg Chest Port 1 View  Result Date: 08/16/2017 CLINICAL DATA:  Respiratory failure EXAM: PORTABLE CHEST 1 VIEW COMPARISON:  Yesterday FINDINGS: Endotracheal tube tip between the clavicular heads and carina. Left IJ line with tip at the upper SVC. An orogastric tube reaches the stomach at least. Emphysema with thick walled bullae apically. Reticular opacities at the bases attributed to bronchitis and atelectasis based on CT yesterday. No edema, effusion, or pneumothorax. Cardiomegaly. Status post CABG. IMPRESSION: 1. Stable positioning of tubes and central line. 2. COPD. Electronically Signed   By: Monte Fantasia M.D.   On: 08/16/2017 07:01   Dg Chest Port 1 View  Result Date: 08/15/2017 CLINICAL DATA:  Respiratory failure EXAM: PORTABLE CHEST 1 VIEW COMPARISON:  Chest x-rays dated 08/14/2017 and 08/13/2017. Comparison chest x-ray dated 03/07/2016. FINDINGS: Endotracheal tube is well positioned with tip just above the level of the carina. Left IJ central line is stable in position with tip at the level of the upper/mid SVC. Enteric tube passes below the diaphragm. Cardiomediastinal silhouette is stable. Atherosclerotic changes noted at the aortic arch. Median sternotomy wires appear intact and stable in alignment. Improved aeration at each lung base compared to yesterday's study suggesting improved fluid status. Coarse lung markings persist at each lung base, stable compared to the older study of 03/07/2016 indicating chronic interstitial lung disease/fibrosis. IMPRESSION: 1.  Improved aeration at each lung base compared to yesterday's study suggesting improved fluid status and/or resolved atelectasis. No new lung findings. 2. Coarse lung markings at each lung base, stable compared to the older study of 03/07/2016 indicating some degree of chronic interstitial lung disease/fibrosis. 3. Support apparatus is stable in position. 4. Aortic atherosclerosis. Electronically Signed   By: Franki Cabot M.D.   On: 08/15/2017 07:14   Dg Chest Port 1 View  Result Date: 08/14/2017 CLINICAL DATA:  Intubated EXAM: PORTABLE CHEST 1 VIEW COMPARISON:  08/14/2017, 02/10/2017 FINDINGS: Left-sided central venous catheter overlies the SVC origin. Interval intubation, tip of the endotracheal tube is about 3.3 cm superior to the carina. Esophageal tube extends below diaphragm but the tip is non included. Post sternotomy changes. Worsening interstitial and alveolar opacity in the bilateral lung bases. Emphysematous disease and scarring. Stable cardiomediastinal silhouette with atherosclerosis. No pneumothorax. IMPRESSION: 1. Endotracheal tube tip about 3.3 cm superior to carina. Esophageal tube extends below diaphragm but the tip is non included 2. Worsening interstitial and alveolar opacity at the bilateral lung bases which may reflect pulmonary edema or pneumonia 3. Emphysema Electronically Signed   By: Donavan Foil M.D.   On: 08/14/2017 15:51   Dg Chest Port 1 View  Result Date: 08/14/2017 CLINICAL DATA:  Central line placement EXAM: PORTABLE CHEST 1 VIEW COMPARISON:  08/14/2017 FINDINGS: Left jugular central venous catheter with the tip projecting over the SVC. Bilateral chronic diffuse interstitial thickening. Bilateral bullous disease. No focal consolidation or pneumothorax. Cardiomediastinal silhouette is stable. Prior CABG. IMPRESSION: 1. Left jugular central venous catheter with the tip projecting over the SVC. 2. Bilateral bullous disease. Electronically Signed   By: Kathreen Devoid   On:  08/14/2017 10:10   Dg Chest Port 1 View  Result Date: 08/14/2017 CLINICAL DATA:  Altered mental status. EXAM: PORTABLE CHEST 1 VIEW COMPARISON:  Most recent radiographs 02/10/2017. Most recent CT 02/23/2016 FINDINGS: Post median sternotomy and CABG. Mild cardiomegaly. Unchanged mediastinal contours. Emphysema. Bleb in the left upper lobe. Scarring  in the periphery of the right upper lobe. No consolidation, pleural fluid or pneumothorax. No pulmonary edema. IMPRESSION: 1. No acute abnormality. 2. Emphysema with multifocal scarring. 3. Stable mild cardiomegaly. Electronically Signed   By: Jeb Levering M.D.   On: 08/14/2017 01:48   Dg Abd Portable 1v  Result Date: 08/14/2017 CLINICAL DATA:  66 year old male enteric tube placement. EXAM: PORTABLE ABDOMEN - 1 VIEW COMPARISON:  CT Abdomen and Pelvis 10/11/2009. Portable chest radiographs today and earlier. FINDINGS: Portable AP semi upright view at 1538 hours. Enteric tube courses to the stomach. The side holes at the low level of the distal gastric body. There is a paucity of other upper abdominal bowel gas. Cholecystectomy clips are new since 2011. Stable visible chest. IMPRESSION: Enteric tube placed into the stomach, side hole up the level of the distal gastric body. Electronically Signed   By: Genevie Ann M.D.   On: 08/14/2017 15:57     CBC Recent Labs  Lab 08/14/17 0121 08/14/17 0902 08/14/17 1700 08/15/17 0517 08/16/17 0511 08/17/17 0430 08/18/17 0511  WBC 5.0 5.4 8.2 9.1 5.4 5.5 3.9*  HGB 14.6 11.9* 13.1 11.9* 11.1* 11.0* 10.1*  HCT 42.9 35.4* 39.2 34.7* 33.1* 32.8* 31.0*  PLT 119* 97* 96* 93* 70* 70* 79*  MCV 89.7 88.5 90.3 87.4 87.6 87.9 89.6  MCH 30.5 29.8 30.2 30.0 29.4 29.5 29.2  MCHC 34.0 33.6 33.4 34.3 33.5 33.5 32.6  RDW 16.1* 15.9* 16.2* 16.5* 16.1* 16.2* 16.4*  LYMPHSABS 3.3 2.5  --   --  0.6*  --   --   MONOABS 0.5 1.2*  --   --  0.6  --   --   EOSABS 0.0 0.0  --   --  0.0  --   --   BASOSABS 0.0 0.0  --   --  0.0  --    --     Chemistries  Recent Labs  Lab 08/14/17 0121  08/15/17 0517 08/16/17 0511 08/16/17 1815 08/17/17 0430 08/17/17 1107 08/18/17 0511 08/19/17 0947 08/20/17 0844  NA 132*   < > 136 141 139  --  135 138 139 140  K 2.8*   < > 2.9* 2.1* 2.9*  --  2.7* 2.9* 2.6* 3.3*  CL 96*   < > 105 104 100*  --  99* 100* 99* 105  CO2 22   < > '22 27 28  ' --  '27 30 29 24  ' GLUCOSE 154*   < > 387* 267* 242*  --  237* 223* 176* 160*  BUN 11   < > '12 12 12  ' --  '10 11 12 12  ' CREATININE 0.82   < > 0.81 0.62 0.63  --  0.64 0.63 0.68 0.63  CALCIUM 8.9   < > 7.0* 7.5* 7.5*  --  7.4* 8.2* 8.4* 8.6*  MG  --   --  1.3* 2.0 1.8 2.4  --  2.0  --   --   AST 63*  --   --   --   --   --   --   --   --   --   ALT 42  --   --   --   --   --   --   --   --   --   ALKPHOS 81  --   --   --   --   --   --   --   --   --  BILITOT 1.5*  --   --   --   --   --   --   --   --   --    < > = values in this interval not displayed.   ------------------------------------------------------------------------------------------------------------------ No results for input(s): CHOL, HDL, LDLCALC, TRIG, CHOLHDL, LDLDIRECT in the last 72 hours.  Lab Results  Component Value Date   HGBA1C 6.1 (H) 08/15/2017   ------------------------------------------------------------------------------------------------------------------ No results for input(s): TSH, T4TOTAL, T3FREE, THYROIDAB in the last 72 hours.  Invalid input(s): FREET3 ------------------------------------------------------------------------------------------------------------------ No results for input(s): VITAMINB12, FOLATE, FERRITIN, TIBC, IRON, RETICCTPCT in the last 72 hours.  Coagulation profile Recent Labs  Lab 08/14/17 0121  INR 1.13    No results for input(s): DDIMER in the last 72 hours.  Cardiac Enzymes Recent Labs  Lab 08/14/17 1337 08/14/17 1907 08/15/17 0151  TROPONINI 0.67* 0.96* 1.20*    ------------------------------------------------------------------------------------------------------------------    Component Value Date/Time   BNP 51.7 01/20/2016 0308   Wenzel Backlund M.D on 08/20/2017 at 7:30 PM  Between 7am to 7pm - Pager - [phone]  After 7pm go to [email or web address] - password [email or web address]  [facility] -  Office  [phone]   Voice Recognition Viviann Spare dictation system was used to create this note, attempts have been made to correct errors. Please contact the author with questions and/or clarifications.

## 2017-08-20 NOTE — Evaluation (Signed)
Occupational Therapy Evaluation Patient Details Name: Joseph [name] MRN: [id number] DOB: Feb 23, 1951 Today's Date: 08/20/2017    History of Present Illness Pt admitted with sepsis. 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   Clinical Impression   Pt walked without a device and was independent in self care prior to this admission. He uses a Art gallery manager for longer distances outside. Pt presents with decreased activity tolerance, impaired standing balance and joint deformities due to RA. He requires min assist for mobility and ADL. Pt is highly motivated to return to independence. Will follow acutely.    Follow Up Recommendations  Home health OT    Equipment Recommendations  None recommended by OT    Recommendations for Other Services       Precautions / Restrictions Precautions Precautions: Fall Precaution Comments: watch O2 Restrictions Weight Bearing Restrictions: No      Mobility Bed Mobility Overal bed mobility: Needs Assistance Bed Mobility: Supine to Sit     Supine to sit: Supervision     General bed mobility comments: increased time  Transfers Overall transfer level: Needs assistance Equipment used: 1 person hand held assist Transfers: Sit to/from Stand;Stand Pivot Transfers Sit to Stand: Min assist Stand pivot transfers: Min assist       General transfer comment: steadying assist    Balance Overall balance assessment: Needs assistance   Sitting balance-Leahy Scale: Good     Standing balance support: Single extremity supported Standing balance-Leahy Scale: Poor                             ADL either performed or assessed with clinical judgement   ADL Overall ADL's : Needs assistance/impaired Eating/Feeding: Independent   Grooming: Wash/dry hands;Standing;Min guard   Upper Body Bathing: Set up;Sitting   Lower Body Bathing: Min guard;Sit to/from stand   Upper Body Dressing : Set up;Sitting   Lower Body Dressing: Sit  to/from stand;Minimal assistance Lower Body Dressing Details (indicate cue type and reason): can don shoes independently Toilet Transfer: Minimal assistance;Stand-pivot Toilet Transfer Details (indicate cue type and reason): simulated to chair Toileting- Clothing Manipulation and Hygiene: Sit to/from stand;Maximal assistance       Functional mobility during ADLs: Minimal assistance General ADL Comments: Pt's wife reports he has difficulty with pericare, but pt denies.     Vision Patient Visual Report: No change from baseline       Perception     Praxis      Pertinent Vitals/Pain Pain Assessment: No/denies pain     Hand Dominance Right   Extremity/Trunk Assessment Upper Extremity Assessment Upper Extremity Assessment: Generalized weakness(RA changes in elbows and hands)   Lower Extremity Assessment Lower Extremity Assessment: Defer to PT evaluation   Cervical / Trunk Assessment Cervical / Trunk Assessment: Kyphotic   Communication Communication Communication: No difficulties   Cognition Arousal/Alertness: Awake/alert Behavior During Therapy: WFL for tasks assessed/performed Overall Cognitive Status: Within Functional Limits for tasks assessed                                     General Comments       Exercises     Shoulder Instructions      Home Living Family/patient expects to be discharged to:: Private residence Living Arrangements: Spouse/significant other Available Help at Discharge: Family;Available 24 hours/day Type of Home: House Home Access: Level entry  Home Layout: Two level Alternate Level Stairs-Number of Steps: has a chair lift   Bathroom Shower/Tub: Walk-in shower   Bathroom Toilet: Handicapped height     Home Equipment: Environmental consultant - 4 wheels;Electric scooter;Wheelchair - manual;Grab bars - tub/shower;Grab bars - toilet;Shower seat - built in(platform walker, 02)          Prior Functioning/Environment Level of  Independence: Independent with assistive device(s)                 OT Problem List: Decreased activity tolerance;Impaired balance (sitting and/or standing);Cardiopulmonary status limiting activity;Impaired UE functional use;Decreased range of motion      OT Treatment/Interventions: Self-care/ADL training;Energy conservation;DME and/or AE instruction;Patient/family education;Balance training;Therapeutic activities    OT Goals(Current goals can be found in the care plan section) Acute Rehab OT Goals Patient Stated Goal: to go to pulmonary rehab and get his strength back OT Goal Formulation: With patient Time For Goal Achievement: 09/10/17 Potential to Achieve Goals: Good ADL Goals Pt Will Perform Grooming: with supervision;standing Pt Will Perform Lower Body Bathing: with max assist;sit to/from stand Pt Will Perform Lower Body Dressing: with supervision;sit to/from stand Pt Will Transfer to Toilet: with supervision;ambulating Pt Will Perform Toileting - Clothing Manipulation and hygiene: with supervision;sit to/from stand(with AE as needed) Pt Will Perform Tub/Shower Transfer: Shower transfer;with supervision;ambulating;shower seat Additional ADL Goal #1: Pt will generalize energy conservation strategies in ADL independently.  OT Frequency: Min 2X/week   Barriers to D/C:            Co-evaluation              AM-PAC PT "6 Clicks" Daily Activity     Outcome Measure Help from another person eating meals?: None Help from another person taking care of personal grooming?: A Little Help from another person toileting, which includes using toliet, bedpan, or urinal?: A Little Help from another person bathing (including washing, rinsing, drying)?: A Little Help from another person to put on and taking off regular upper body clothing?: None Help from another person to put on and taking off regular lower body clothing?: A Little 6 Click Score: 20   End of Session Equipment  Utilized During Treatment: Gait belt;Oxygen(3L) Nurse Communication: Mobility status  Activity Tolerance: Patient limited by fatigue Patient left: in chair;with call bell/phone within reach(MD in room)  OT Visit Diagnosis: Unsteadiness on feet (R26.81);Other abnormalities of gait and mobility (R26.89);Muscle weakness (generalized) (M62.81)                Time: 9741-6384 OT Time Calculation (min): 49 min Charges:  OT General Charges $OT Visit: 1 Visit OT Evaluation $OT Eval Moderate Complexity: 1 Mod OT Treatments $Self Care/Home Management : 23-37 mins G-Codes:     2017/08/29 [name], OTR/L Pager: [phone]  Iran Planas, [place] Bailiff 08/29/2017, 12:41 PM

## 2017-08-20 NOTE — Evaluation (Signed)
Physical Therapy Evaluation Patient Details Name: Joseph [name] MRN: [id number] DOB: [birth year] Today's Date: 08/20/2017   History of Present Illness  Pt admitted with sepsis. 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  Clinical Impression  Pt admitted with/for sepsis.  Pt generally deconditioned and weak in trunk and extremities.  Pt currently limited functionally due to the problems listed below.  (see problems list.)  Pt will benefit from PT to maximize function and safety to be able to get home safely with available assist of wife.     Follow Up Recommendations Home health PT with eventual transition to pulmonary rehab.    Equipment Recommendations  None recommended by PT    Recommendations for Other Services       Precautions / Restrictions Precautions Precautions: Fall Precaution Comments: watch O2 Restrictions Weight Bearing Restrictions: No      Mobility  Bed Mobility                  Transfers Overall transfer level: Needs assistance Equipment used: 1 person hand held assist;Bilateral platform walker Transfers: Sit to/from Stand Sit to Stand: Min assist         General transfer comment: steadying assist  Ambulation/Gait Ambulation/Gait assistance: Min guard Ambulation Distance (Feet): 240 Feet Assistive device: Bilateral platform walker Gait Pattern/deviations: Step-through pattern     General Gait Details: moderate lean on the RW platforms with feet outside the RW, but steps were generally stable.  Turns were stable, pt's sats stayed in the 89-92% range on 4L [place].  At rest sats ran around 96% on 3L.  Stairs            Wheelchair Mobility    Modified Rankin (Stroke Patients Only)       Balance Overall balance assessment: Needs assistance   Sitting balance-Leahy Scale: Good     Standing balance support: Single extremity supported Standing balance-Leahy Scale: Poor Standing balance comment: dependent on some form of  external assist                             Pertinent Vitals/Pain Pain Assessment: No/denies pain    Home Living Family/patient expects to be discharged to:: Private residence Living Arrangements: Spouse/significant other Available Help at Discharge: Family;Available 24 hours/day Type of Home: House Home Access: Level entry     Home Layout: Two level Home Equipment: Walker - 4 wheels;Electric scooter;Wheelchair - manual;Grab bars - tub/shower;Grab bars - toilet;Shower seat - built in(platform walker, 02)      Prior Function Level of Independence: Independent with assistive device(s)               Hand Dominance   Dominant Hand: Right    Extremity/Trunk Assessment   Upper Extremity Assessment Upper Extremity Assessment: Generalized weakness    Lower Extremity Assessment Lower Extremity Assessment: Generalized weakness    Cervical / Trunk Assessment Cervical / Trunk Assessment: Kyphotic  Communication   Communication: No difficulties  Cognition Arousal/Alertness: Awake/alert Behavior During Therapy: WFL for tasks assessed/performed Overall Cognitive Status: Within Functional Limits for tasks assessed                                        General Comments General comments (skin integrity, edema, etc.): pt very motivated to walk in the halls smf build his stamina.    Exercises  Assessment/Plan    PT Assessment Patient needs continued PT services  PT Problem List Decreased strength;Decreased activity tolerance;Decreased balance;Decreased mobility;Decreased knowledge of use of DME       PT Treatment Interventions DME instruction;Gait training;Functional mobility training;Therapeutic activities;Balance training;Patient/family education    PT Goals (Current goals can be found in the Care Plan section)  Acute Rehab PT Goals Patient Stated Goal: to go to pulmonary rehab and get his strength back PT Goal Formulation: With  patient Time For Goal Achievement: 08/27/17 Potential to Achieve Goals: Good    Frequency Min 3X/week   Barriers to discharge        Co-evaluation               AM-PAC PT "6 Clicks" Daily Activity  Outcome Measure Difficulty turning over in bed (including adjusting bedclothes, sheets and blankets)?: Unable Difficulty moving from lying on back to sitting on the side of the bed? : Unable Difficulty sitting down on and standing up from a chair with arms (e.g., wheelchair, bedside commode, etc,.)?: Unable Help needed moving to and from a bed to chair (including a wheelchair)?: A Little Help needed walking in hospital room?: A Little Help needed climbing 3-5 steps with a railing? : A Lot 6 Click Score: 11    End of Session   Activity Tolerance: Patient tolerated treatment well Patient left: in chair;with chair alarm set;with call bell/phone within reach;with family/visitor present Nurse Communication: Mobility status PT Visit Diagnosis: Unsteadiness on feet (R26.81);Other abnormalities of gait and mobility (R26.89);Muscle weakness (generalized) (M62.81)    Time: 1410-1441 PT Time Calculation (min) (ACUTE ONLY): 31 min   Charges:   PT Evaluation $PT Eval Moderate Complexity: 1 Mod PT Treatments $Gait Training: 8-22 mins   PT G Codes:        [name]-01-03  [place] Joseph [name], PT [phone]  (pager)  Joseph [name] Joseph [name], 4:52 PM

## 2017-08-20 NOTE — Care Management Important Message (Signed)
Important Message  Patient Details  Name: Joseph [name] MRN: [id number] Date of Birth: 12-Dec-1950   Medicare Important Message Given:  Yes    Dorena Bodo 08/20/2017, 2:02 PM

## 2017-08-21 DIAGNOSIS — I5032 Chronic diastolic (congestive) heart failure: Secondary | ICD-10-CM

## 2017-08-21 DIAGNOSIS — R197 Diarrhea, unspecified: Secondary | ICD-10-CM

## 2017-08-21 DIAGNOSIS — M05742 Rheumatoid arthritis with rheumatoid factor of left hand without organ or systems involvement: Secondary | ICD-10-CM

## 2017-08-21 DIAGNOSIS — J9621 Acute and chronic respiratory failure with hypoxia: Secondary | ICD-10-CM

## 2017-08-21 DIAGNOSIS — E118 Type 2 diabetes mellitus with unspecified complications: Secondary | ICD-10-CM

## 2017-08-21 DIAGNOSIS — M05741 Rheumatoid arthritis with rheumatoid factor of right hand without organ or systems involvement: Secondary | ICD-10-CM

## 2017-08-21 LAB — BASIC METABOLIC PANEL
ANION GAP: 9 (ref 5–15)
BUN: 13 mg/dL (ref 6–20)
CO2: 25 mmol/L (ref 22–32)
Calcium: 8.7 mg/dL — ABNORMAL LOW (ref 8.9–10.3)
Chloride: 104 mmol/L (ref 101–111)
Creatinine, Ser: 0.5 mg/dL — ABNORMAL LOW (ref 0.61–1.24)
GFR calc Af Amer: >60 mL/min (ref >60)
GLUCOSE: 87 mg/dL (ref 65–99)
POTASSIUM: 3.7 mmol/L (ref 3.5–5.1)
Sodium: 138 mmol/L (ref 135–145)

## 2017-08-21 LAB — CBC
HEMATOCRIT: 37 % — AB (ref 39.0–52.0)
HEMOGLOBIN: 12.2 g/dL — AB (ref 13.0–17.0)
MCH: 29.9 pg (ref 26.0–34.0)
MCHC: 33 g/dL (ref 30.0–36.0)
MCV: 90.7 fL (ref 78.0–100.0)
Platelets: 138 10*3/uL — ABNORMAL LOW (ref 150–400)
RBC: 4.08 MIL/uL — ABNORMAL LOW (ref 4.22–5.81)
RDW: 16.7 % — ABNORMAL HIGH (ref 11.5–15.5)
WBC: 3.3 10*3/uL — ABNORMAL LOW (ref 4.0–10.5)

## 2017-08-21 LAB — GLUCOSE, CAPILLARY
GLUCOSE-CAPILLARY: 104 mg/dL — AB (ref 65–99)
Glucose-Capillary: 183 mg/dL — ABNORMAL HIGH (ref 65–99)
Glucose-Capillary: 181 mg/dL — ABNORMAL HIGH (ref 65–99)
Glucose-Capillary: 121 mg/dL — ABNORMAL HIGH (ref 65–99)

## 2017-08-21 MED ORDER — SUMATRIPTAN SUCCINATE 50 MG PO TABS
50.0000 mg | ORAL_TABLET | Freq: Once | ORAL | Status: AC
  Administered 2017-08-21: 50 mg via ORAL
  Filled 2017-08-21: qty 1

## 2017-08-21 MED ORDER — PRO-STAT SUGAR FREE PO LIQD
30.0000 mL | Freq: Every day | ORAL | Status: DC
  Administered 2017-08-21: 30 mL via ORAL
  Filled 2017-08-21 (×2): qty 30

## 2017-08-21 NOTE — Progress Notes (Signed)
PROGRESS NOTE    [name]  JYN:[id number] DOB: 05/30/1951 DOA: 08/14/2017 PCP: Wanda PlumpPaz, Jose E, MD   Brief Narrative:  66 year old WM PMHx Peripheral Neuropathy, CAD, HTN, COPD O2 dependent 3 L via [place],  Diabetes mellitus uncontrolled with complication,Gastroparesis, Esophageal dysmotility, Fatty liver (10/11/09), GERD  Hiatal hernia, Psoriasis, and Rheumatoid arthritis .   Admitted on 08/14/17 with concerns about septic shock in the setting of fevers up to 106, headache, diarrhea malaise and persistent hypotension despite IV fluids. H/o rheumatoid arthritis (on daily prednisone and methotrexate), COPD/ bronchiectasis on 3 L oxygen, diabetes, GERD/Barretts, recurrent pneumonia secondary to chronic aspiration/reflux, and coronary artery disease. Pt had a URI/sinus symptoms presented to pulmonary clinic for this and was written a prescription for Augmentin.  His symptoms progressed and he developed high fevers (106), diarrhea, headache, malaise.    Symptoms were felt to be infectious in origin and potentially meningitis.  He had LP done in the emergency department and was started on broad-spectrum antibiotic.  He was hypotensive despite IV fluid bolus and PCCM admitted him on 08/14/17, transferred to [facility] on 08/19/17     Subjective: 12/27 A/O x4, negative CP, positive chronic S OB.  Negative abdominal pain.  Positive headache rated at 3/10 down from 7/10 located forehead behind eyes.  Negative photosensitivity negative sensitivity sound.  Since completing course of antibiotics explosive watery diarrhea.    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  Patient immunocompromised from long-term steroid/immunosuppressant use for his rheumatoid arthritis. -All cultures negative to date  Acute on chronic respiratory failure with hypoxia  - Ambulatory SPO2 pending - Mucinex DM -DuoNeb - Complete antibiotics as above: Per EMR there was concern for Pseudomonas bronchiectasis   Rheumatoid Arthritis -Patient on long-term Methotrexate -Patient on long-term prednisone  -Counseled patient and wife to continue current home dosing until they speak with her rheumatologist.  Chronic diastolic CHF -Strict in and out -Daily weight   Diabetes type 2 controlled with complication -Unsure of true diabetes or iatrogenic secondary to patient's long history of steroid use: Treatment would be the same. - 12/21 Hemoglobin A1c= 6.1   Headache (migraine?) -Sumatriptan 50 mg x1  Explosive watery Diarrhea -DC Senokot if patient continues to have diarrhea will evaluate for C. difficile  Goals of care - Patient requests that he be discharged to [facility].  Spoke with LCSW Darl PikesSusan today who will investigate into feasibility of placement of patient   DVT prophylaxis: SCD Code Status: Full Family Communication: Wife at bedside for discussion of plan of care Disposition Plan: Anticipated discharge 12/28   Consultants:  P CCM   Procedures/Significant Events:     I have personally reviewed and interpreted all radiology studies and my findings are as above.  VENTILATOR SETTINGS:    Cultures     Antimicrobials: Anti-infectives (From admission, onward)   Start     Stop   08/14/17 2200  azithromycin (ZITHROMAX) 500 mg in dextrose 5 % 250 mL IVPB  Status:  Discontinued     08/15/17 1112   08/14/17 2000  cefTRIAXone (ROCEPHIN) 2 g in  dextrose 5 % 50 mL IVPB  Status:  Discontinued     08/14/17 0554   08/14/17 0800  ampicillin (OMNIPEN) 2 g in sodium chloride 0.9 % 50 mL  IVPB  Status:  Discontinued     08/15/17 1112   08/14/17 0600  acyclovir (ZOVIRAX) 1,000 mg in dextrose 5 % 150 mL IVPB  Status:  Discontinued     08/15/17 1112   08/14/17 0600  vancomycin (VANCOCIN) 1,250 mg in sodium chloride 0.9 % 250 mL IVPB  Status:  Discontinued     08/17/17 1531   08/14/17 0600  ceFEPIme (MAXIPIME) 2 g in dextrose 5 % 50 mL IVPB     08/21/17 2359   08/14/17 0345  vancomycin (VANCOCIN) IVPB 1000 mg/200 mL premix  Status:  Discontinued     08/14/17 0550   08/14/17 0345  ampicillin (OMNIPEN) 2 g in sodium chloride 0.9 % 50 mL IVPB     08/14/17 0607   08/14/17 0345  cefTRIAXone (ROCEPHIN) 1 g in dextrose 5 % 50 mL IVPB     08/14/17 0544   08/14/17 0145  cefTRIAXone (ROCEPHIN) 1 g in dextrose 5 % 50 mL IVPB     08/14/17 0246   08/14/17 0145  azithromycin (ZITHROMAX) 500 mg in dextrose 5 % 250 mL IVPB     08/14/17 0349       Devices    LINES / TUBES:      Continuous Infusions: . sodium chloride    . ceFEPime (MAXIPIME) IV 2 g (08/21/17 0606)     Objective: Vitals:   08/20/17 2230 08/20/17 2328 08/21/17 0429 08/21/17 0740  BP:  (!) 148/78 122/79 133/82  Pulse: 87 72 84 86  Resp: 15 17 19 15   Temp:  99.3 F (37.4 C) 98.5 F (36.9 C) 98.4 F (36.9 C)  TempSrc:  Oral Oral Oral  SpO2: 95% 97% 95% 95%  Weight:   212 lb 14.4 oz (96.6 kg)   Height:        Intake/Output Summary (Last 24 hours) at 08/21/2017 0847 Last data filed at 08/21/2017 0810 Gross per 24 hour  Intake 1380 ml  Output 2440 ml  Net -1060 ml   Filed Weights   08/19/17 1127 08/20/17 0532 08/21/17 0429  Weight: 221 lb 12.5 oz (100.6 kg) 224 lb (101.6 kg) 212 lb 14.4 oz (96.6 kg)    Examination:  General: A/O x4, positive chronic respiratory distress Neck:  Negative scars, masses, torticollis, lymphadenopathy, JVD Lungs: Clear to auscultation bilaterally without wheezes or crackles Cardiovascular: Regular rate and rhythm without murmur gallop or rub normal S1 and  S2 Abdomen: negative abdominal pain, nondistended, positive soft, bowel sounds, no rebound, no ascites, no appreciable mass Extremities: No significant cyanosis, clubbing, or edema bilateral lower extremities.  Bilateral upper extremities swan neck deformities of hand C/W long history of RA Skin: Negative rashes, lesions, ulcers Psychiatric:  Negative depression, negative anxiety, negative fatigue, negative mania  Central nervous system:  Cranial nerves II through XII intact, tongue/uvula midline, all extremities muscle strength 5/5, sensation intact throughout,  negative dysarthria, negative expressive aphasia, negative receptive aphasia.  .     Data Reviewed: Care during the described time interval was provided by me .  I have reviewed this patient's available data, including medical history, events of note, physical examination, and all test results as part of my evaluation.   CBC: Recent Labs  Lab 08/14/17 0902  08/15/17 7185 08/16/17 5015 08/17/17 0430 08/18/17 0511 08/21/17 0419  WBC 5.4   < > 9.1 5.4 5.5 3.9* 3.3*  NEUTROABS 1.7  --   --  4.1  --   --   --   HGB 11.9*   < > 11.9* 11.1* 11.0* 10.1* 12.2*  HCT 35.4*   < > 34.7* 33.1* 32.8* 31.0* 37.0*  MCV 88.5   < > 87.4 87.6 87.9 89.6 90.7  PLT 97*   < > 93* 70* 70* 79* 138*   < > = values in this interval not displayed.   Basic Metabolic Panel: Recent Labs  Lab 08/15/17 0517 08/16/17 0511 08/16/17 1815 08/17/17 0430 08/17/17 1107 08/18/17 0511 08/19/17 0947 08/20/17 0844 08/21/17 0419  NA 136 141 139  --  135 138 139 140 138  K 2.9* 2.1* 2.9*  --  2.7* 2.9* 2.6* 3.3* 3.7  CL 105 104 100*  --  99* 100* 99* 105 104  CO2 22 27 28   --  27 30 29 24 25   GLUCOSE 387* 267* 242*  --  237* 223* 176* 160* 87  BUN 12 12 12   --  10 11 12 12 13   CREATININE 0.81 0.62 0.63  --  0.64 0.63 0.68 0.63 0.50*  CALCIUM 7.0* 7.5* 7.5*  --  7.4* 8.2* 8.4* 8.6* 8.7*  MG 1.3* 2.0 1.8 2.4  --  2.0  --   --   --   PHOS 2.4* 1.4* 1.5*  2.2*  --  1.3*  --   --   --    GFR: Estimated Creatinine Clearance: 110.9 mL/min (A) (by C-G formula based on SCr of 0.5 mg/dL (L)). Liver Function Tests: No results for input(s): AST, ALT, ALKPHOS, BILITOT, PROT, ALBUMIN in the last 168 hours. No results for input(s): LIPASE, AMYLASE in the last 168 hours. No results for input(s): AMMONIA in the last 168 hours. Coagulation Profile: No results for input(s): INR, PROTIME in the last 168 hours. Cardiac Enzymes: Recent Labs  Lab 08/14/17 1337 08/14/17 1907 08/15/17 0151  TROPONINI 0.67* 0.96* 1.20*   BNP (last 3 results) No results for input(s): PROBNP in the last 8760 hours. HbA1C: No results for input(s): HGBA1C in the last 72 hours. CBG: Recent Labs  Lab 08/20/17 0741 08/20/17 1129 08/20/17 1610 08/20/17 2034 08/21/17 0742  GLUCAP 85 146* 180* 123* 104*   Lipid Profile: No results for input(s): CHOL, HDL, LDLCALC, TRIG, CHOLHDL, LDLDIRECT in the last 72 hours. Thyroid Function Tests: No results for input(s): TSH, T4TOTAL, FREET4, T3FREE, THYROIDAB in the last 72 hours. Anemia Panel: No results for input(s): VITAMINB12, FOLATE, FERRITIN, TIBC, IRON, RETICCTPCT in the last 72 hours. Urine analysis:    Component Value Date/Time   COLORURINE YELLOW 08/14/2017 0146   APPEARANCEUR CLEAR 08/14/2017 0146   LABSPEC 1.010 08/14/2017 0146   PHURINE 5.0 08/14/2017 0146   GLUCOSEU NEGATIVE 08/14/2017 0146   HGBUR NEGATIVE 08/14/2017 0146   HGBUR negative 04/12/2008 1103   BILIRUBINUR NEGATIVE 08/14/2017 0146   KETONESUR NEGATIVE 08/14/2017 0146   PROTEINUR NEGATIVE 08/14/2017 0146   UROBILINOGEN 0.2 10/17/2012 1236   NITRITE NEGATIVE 08/14/2017 0146   LEUKOCYTESUR NEGATIVE 08/14/2017 0146   Sepsis Labs: @LABRCNTIP (procalcitonin:4,lacticidven:4)  ) Recent Results (from the past 240 hour(s))  Culture, blood (Routine x 2)     Status: None   Collection Time: 08/14/17  1:18 AM  Result Value Ref Range Status   Specimen  Description BLOOD RIGHT ANTECUBITAL  Final   Special Requests   Final    BOTTLES DRAWN AEROBIC AND ANAEROBIC  Blood Culture adequate volume   Culture NO GROWTH 5 DAYS  Final   Report Status 08/19/2017 FINAL  Final  Culture, blood (Routine x 2)     Status: None   Collection Time: 08/14/17  1:22 AM  Result Value Ref Range Status   Specimen Description BLOOD LEFT ANTECUBITAL  Final   Special Requests   Final    BOTTLES DRAWN AEROBIC AND ANAEROBIC Blood Culture adequate volume   Culture NO GROWTH 5 DAYS  Final   Report Status 08/19/2017 FINAL  Final  Urine culture     Status: None   Collection Time: 08/14/17  1:46 AM  Result Value Ref Range Status   Specimen Description URINE, CATHETERIZED  Final   Special Requests NONE  Final   Culture NO GROWTH  Final   Report Status 08/15/2017 FINAL  Final  CSF culture     Status: None   Collection Time: 08/14/17  4:56 AM  Result Value Ref Range Status   Specimen Description CSF  Final   Special Requests NONE  Final   Gram Stain   Final    WBC PRESENT, PREDOMINANTLY MONONUCLEAR NO ORGANISMS SEEN CYTOSPIN SMEAR    Culture NO GROWTH 3 DAYS  Final   Report Status 08/17/2017 FINAL  Final  MRSA PCR Screening     Status: None   Collection Time: 08/14/17  7:24 AM  Result Value Ref Range Status   MRSA by PCR NEGATIVE NEGATIVE Final    Comment:        The GeneXpert MRSA Assay (FDA approved for NASAL specimens only), is one component of a comprehensive MRSA colonization surveillance program. It is not intended to diagnose MRSA infection nor to guide or monitor treatment for MRSA infections.   C difficile quick scan w PCR reflex     Status: None   Collection Time: 08/14/17 12:29 PM  Result Value Ref Range Status   C Diff antigen NEGATIVE NEGATIVE Final   C Diff toxin NEGATIVE NEGATIVE Final   C Diff interpretation No C. difficile detected.  Final         Radiology Studies: No results found.      Scheduled Meds: . amitriptyline   10 mg Oral QHS  . azelastine  2 spray Each Nare BID  . dextromethorphan-guaiFENesin  1 tablet Oral BID  . fluticasone  2 spray Each Nare BID  . fluticasone furoate-vilanterol  1 puff Inhalation Daily   And  . umeclidinium bromide  1 puff Inhalation Daily  . furosemide  40 mg Intravenous Daily  . insulin aspart  0-20 Units Subcutaneous TID WC  . insulin glargine  10 Units Subcutaneous Daily  . pantoprazole  40 mg Oral Daily  . potassium chloride  40 mEq Oral Daily  . predniSONE  30 mg Oral Q breakfast  . pregabalin  75 mg Oral BID  . zolpidem  10 mg Oral QHS   Continuous Infusions: . sodium chloride    . ceFEPime (MAXIPIME) IV 2 g (08/21/17 0606)     LOS: 7 days    Time spent: 40 minutes    Abednego Yeates, Roselind Messier, MD [facility] Pager [phone]   If 7PM-7AM, please contact night-coverage [email or web address] Password [facility] 08/21/2017, 8:47 AM

## 2017-08-21 NOTE — Progress Notes (Signed)
Physical Therapy Treatment Patient Details Name: Joseph [name] MRN: [id number] DOB: 06-19-1951 Today's Date: 08/21/2017    History of Present Illness Pt admitted with sepsis. 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    PT Comments    Improving steadily.  Pt completed standing exercise well, but needed rest in between exercise and gait training.   Follow Up Recommendations  Home health PT     Equipment Recommendations  None recommended by PT    Recommendations for Other Services       Precautions / Restrictions Precautions Precautions: Fall Precaution Comments: watch O2    Mobility  Bed Mobility                  Transfers Overall transfer level: Needs assistance Equipment used: Bilateral platform walker Transfers: Sit to/from Stand Sit to Stand: Min assist         General transfer comment: steadying assist  Ambulation/Gait Ambulation/Gait assistance: Min guard Ambulation Distance (Feet): 240 Feet Assistive device: Bilateral platform walker Gait Pattern/deviations: Step-through pattern   Gait velocity interpretation: Below normal speed for age/gender General Gait Details: sats maintained at 98% on 3L Joseph  EHR in the 90's   Stairs            Wheelchair Mobility    Modified Rankin (Stroke Patients Only)       Balance Overall balance assessment: Needs assistance   Sitting balance-Leahy Scale: Good       Standing balance-Leahy Scale: Poor Standing balance comment: still reliant on the PFRW                            Cognition Arousal/Alertness: Awake/alert Behavior During Therapy: WFL for tasks assessed/performed Overall Cognitive Status: Within Functional Limits for tasks assessed                                        Exercises General Exercises - Lower Extremity Hip ABduction/ADduction: AAROM;Strengthening;10 reps;Standing Hip Flexion/Marching: AROM;Strengthening;Both;10 reps;Standing     General Comments        Pertinent Vitals/Pain      Home Living                      Prior Function            PT Goals (current goals can now be found in the care plan section) Acute Rehab PT Goals Patient Stated Goal: to go to pulmonary rehab and get his strength back Time For Goal Achievement: 08/27/17 Potential to Achieve Goals: Good Progress towards PT goals: Progressing toward goals    Frequency    Min 3X/week      PT Plan Current plan remains appropriate    Co-evaluation              AM-PAC PT "6 Clicks" Daily Activity  Outcome Measure  Difficulty turning over in bed (including adjusting bedclothes, sheets and blankets)?: A Little Difficulty moving from lying on back to sitting on the side of the bed? : A Little Difficulty sitting down on and standing up from a chair with arms (e.g., wheelchair, bedside commode, etc,.)?: A Little Help needed moving to and from a bed to chair (including a wheelchair)?: A Little Help needed walking in hospital room?: A Little Help needed climbing 3-5 steps with a railing? : A Little 6 Click  Score: 18    End of Session Equipment Utilized During Treatment: Oxygen Activity Tolerance: Patient tolerated treatment well Patient left: in chair;with call bell/phone within reach Nurse Communication: Mobility status PT Visit Diagnosis: Unsteadiness on feet (R26.81);Other abnormalities of gait and mobility (R26.89);Muscle weakness (generalized) (M62.81)     Time: 1132-1200 PT Time Calculation (min) (ACUTE ONLY): 28 min  Charges:  $Gait Training: 8-22 mins $Therapeutic Activity: 8-22 mins                    G Codes:       08/29/17  Joseph [name], PT [phone]  (pager)   Joseph [name] 29-Aug-2017, 5:08 PM

## 2017-08-21 NOTE — Progress Notes (Signed)
Pharmacy Antibiotic Note  Joseph [name] is a 66 y.o. male admitted on 08/14/2017 with altered mental status.  On D#8 of Cefepime for pneumonia.   Clinically patient is much improved - Respiratory symptoms have improved, AMS resolved, afebrile, wbc wnl, and renal function stable. All cultures negative to date.   Plan: Cefepime 2 gram IV q 8 hours to end today Pharmacy will sign off  Height: 6' (182.9 cm) Weight: 212 lb 14.4 oz (96.6 kg) IBW/kg (Calculated) : 77.6  Temp (24hrs), Avg:98.5 F (36.9 C), Min:98 F (36.7 C), Max:99.3 F (37.4 C)  Recent Labs  Lab 08/14/17 1602  08/15/17 0517 08/16/17 0511  08/17/17 0430 08/17/17 1107 08/18/17 0511 08/19/17 0947 08/20/17 0844 08/21/17 0419  WBC  --    < > 9.1 5.4  --  5.5  --  3.9*  --   --  3.3*  CREATININE  --   --  0.81 0.62   < >  --  0.64 0.63 0.68 0.63 0.50*  LATICACIDVEN 4.3*  --   --   --   --  2.1*  --  3.7*  --   --   --    < > = values in this interval not displayed.    Estimated Creatinine Clearance: 110.9 mL/min (A) (by C-G formula based on SCr of 0.5 mg/dL (L)).    No Known Allergies  Antimicrobials this admission: Ampicillin 12/20>>12/21 Azithromycin 12/20>>12/21 Ceftriaxone 12/20>>12/20 Cefepime 12/20>> Acyclovir 12/20>>12/21 Vanc 12/20>>12/23   Microbiology results: 12/20 blood x 2 - NGTD 12/20 urine - NEG 12/20 CSF - NEG 12/20 flu neg 12/20 Cdiff - NEG 12/20 MRSA - NEG Legionella - NEG Strep urine - NEG HIV - NEG HSV - NEG    Thank you for allowing pharmacy to be a part of this patient's care.   Al Corpus, PharmD PGY1 Pharmacy Resident Phone: [phone] After 4:30PM please call Main Pharmacy [phone] 08/21/2017 11:13 AM

## 2017-08-21 NOTE — Progress Notes (Signed)
Nutrition Follow-up  DOCUMENTATION CODES:   Obesity unspecified  INTERVENTION:  Provide 30 ml Prostat po once daily, each supplement provides 100 kcal and 15 grams of protein.   Encourage adequate PO intake.   NUTRITION DIAGNOSIS:   Inadequate oral intake related to inability to eat as evidenced by NPO status; diet advanced; improved  GOAL:   Patient will meet greater than or equal to 90% of their needs; met  MONITOR:   PO intake, Labs, I & O's, Weight trends, Skin  REASON FOR ASSESSMENT:   Consult Enteral/tube feeding initiation and management  ASSESSMENT:   66 yo male with PMH of COPD and RA who was admitted on 12/20 with septic shock, A fib with RVR, requiring intubation on admission.  Pt extubated 12/22. Pt is currently on a carbohydrate modified diet with thin liquids. Meal completion has been mostly 100%. Noted pt with stage 2 pressure injury. RD to order prostat to aid in wound healing. Labs and medications reviewed. Weight has been fluctating likely related to fluid status.   Diet Order:  Diet Carb Modified Fluid consistency: Thin; Room service appropriate? Yes  EDUCATION NEEDS:   No education needs have been identified at this time  Skin:  Skin Assessment: Skin Integrity Issues: Skin Integrity Issues:: Stage II Stage II: buttocks  Last BM:  12/26  Height:   Ht Readings from Last 1 Encounters:  08/19/17 6' (1.829 m)    Weight:   Wt Readings from Last 1 Encounters:  08/21/17 212 lb 14.4 oz (96.6 kg)    Ideal Body Weight:  80.9 kg  BMI:  Body mass index is 28.87 kg/m.  Estimated Nutritional Needs:   Kcal:  2417-5301  Protein:  110-125 grams  Fluid:  Per MD    Corrin Parker, MS, RD, LDN Pager # [phone] After hours/ weekend pager # [phone]

## 2017-08-22 ENCOUNTER — Telehealth: Payer: Self-pay | Admitting: Pulmonary Disease

## 2017-08-22 DIAGNOSIS — M05711 Rheumatoid arthritis with rheumatoid factor of right shoulder without organ or systems involvement: Secondary | ICD-10-CM

## 2017-08-22 DIAGNOSIS — J479 Bronchiectasis, uncomplicated: Secondary | ICD-10-CM

## 2017-08-22 DIAGNOSIS — J438 Other emphysema: Secondary | ICD-10-CM

## 2017-08-22 LAB — GLUCOSE, CAPILLARY
GLUCOSE-CAPILLARY: 94 mg/dL (ref 65–99)
Glucose-Capillary: 165 mg/dL — ABNORMAL HIGH (ref 65–99)
Glucose-Capillary: 209 mg/dL — ABNORMAL HIGH (ref 65–99)

## 2017-08-22 MED ORDER — ZOLPIDEM TARTRATE 10 MG PO TABS: 10.0000 mg | ORAL_TABLET | Freq: Every day | ORAL | 0 refills | Status: DC

## 2017-08-22 MED ORDER — FLUTICASONE PROPIONATE 50 MCG/ACT NA SUSP: 2.0000 | Freq: Two times a day (BID) | NASAL | 0 refills | Status: DC

## 2017-08-22 MED ORDER — IPRATROPIUM-ALBUTEROL 0.5-2.5 (3) MG/3ML IN SOLN: 3.0000 mL | RESPIRATORY_TRACT | 0 refills | Status: DC | PRN

## 2017-08-22 MED ORDER — SUMATRIPTAN SUCCINATE 50 MG PO TABS
50.0000 mg | ORAL_TABLET | Freq: Once | ORAL | Status: AC
  Administered 2017-08-22: 50 mg via ORAL
  Filled 2017-08-22: qty 1

## 2017-08-22 MED ORDER — INSULIN GLARGINE 100 UNITS/ML SOLOSTAR PEN: 10.0000 [IU] | PEN_INJECTOR | Freq: Every day | SUBCUTANEOUS | 11 refills | Status: DC

## 2017-08-22 MED ORDER — PREDNISONE 10 MG PO TABS: 20.0000 mg | ORAL_TABLET | Freq: Every day | ORAL | 0 refills | Status: DC

## 2017-08-22 MED ORDER — BENZONATATE 100 MG PO CAPS: 100.0000 mg | ORAL_CAPSULE | Freq: Two times a day (BID) | ORAL | 0 refills | Status: DC | PRN

## 2017-08-22 MED ORDER — PEN NEEDLES 29G X 12MM MISC: 0 refills | Status: DC

## 2017-08-22 MED ORDER — POTASSIUM CHLORIDE ER 10 MEQ PO TBCR: 40.0000 meq | EXTENDED_RELEASE_TABLET | Freq: Every day | ORAL | 9 refills | Status: DC

## 2017-08-22 MED ORDER — FUROSEMIDE 20 MG PO TABS: 40.0000 mg | ORAL_TABLET | Freq: Every day | ORAL | 3 refills | Status: DC

## 2017-08-22 MED ORDER — DM-GUAIFENESIN ER 30-600 MG PO TB12: 1.0000 | ORAL_TABLET | Freq: Two times a day (BID) | ORAL | 0 refills | Status: DC

## 2017-08-22 MED ORDER — PREDNISONE 10 MG PO TABS: 30.0000 mg | ORAL_TABLET | Freq: Every day | ORAL | 0 refills | Status: AC

## 2017-08-22 MED ORDER — SUMATRIPTAN SUCCINATE 50 MG PO TABS: 50.0000 mg | ORAL_TABLET | Freq: Every day | ORAL | 0 refills | Status: DC | PRN

## 2017-08-22 NOTE — Progress Notes (Signed)
Discharge summary faxed to Dr. Annita Brod per authorization for disclosure form signed by Mervyn Skeeters. Authorization form and fax placed in patient's chart.   Shauna Hugh, RN

## 2017-08-22 NOTE — Care Management Note (Addendum)
Case Management Note  Patient Details  Name: Joseph [name] MRN: [id number] Date of Birth: 1951/04/30  Subjective/Objective:                 Spoke to patient and wife. Patient states he has his own O2 equipment, and would like to use [name]. Anticipate HH PT RN [facility]HA, referral made to [name], clinical liaison. CM will continue to follow.  14:17 Requested from [name], verification of coverage per request of patient's wife who was told (by whom??) that [name] would be out of pocket. Awaiting callback. Per [name], patient referral accepted, and patient should not have a copay. Explained this to patient's wife in detail. Patient interested in CardioPulm rehab at [name].  [name] [phone] closed Fridays (today). CM discussed with patient to follow up on this with PCP or previous referring physician. Patient will have HH PT after [name].    Action/Plan:  Needs HH orders.  Expected Discharge Date:  08/25/17               Expected Discharge Plan:  Home/Self Care  In-House Referral:     Discharge planning Services  CM Consult  Post Acute Care Choice:  Home [name] Choice offered to:  Patient, Spouse  DME Arranged:    DME Agency:     HH Arranged:  RN, PT HH Agency:  [name]  Status of Service:  Completed, signed off  If discussed at Long Length of Stay Meetings, dates discussed:    Additional Comments:  Lawerance Sabal, RN 08/22/2017, 10:13 AM

## 2017-08-22 NOTE — Care Management (Addendum)
ED CM received call from Chrissy RN on 6E concerning patient who is discharging with [facility] services. Nurse is concerned that patient's wife reports contacting [employer] to verify [facility] services with Chip Boer and was informed that it would be an out of pocket expense not covered by [employer]. Wife states she was informed by the [facility] Liaison that [facility] services would be covered. Wife concerned and states she cannot afford out of pocket expenses.  ED CM limited with contacting [facility] agencies due to holiday weekend after hours. ED CM contacted [facility] with referral and awaiting return call. CM updated wife who states, she was informed by [facility] is the preferred agency within network and would prefer receive services [facility]. ED CM will attempt to obtain [facility] orders and fax referral to [facility]  via [facility] as well. ED CM will provide handoff to D. Swist NCM to follow up tomorrow Sat 12/29. Patient will be discharged home and transported via private vehicle accompanied by wife

## 2017-08-22 NOTE — Progress Notes (Signed)
Occupational Therapy Treatment and Discharge Patient Details Name: Joseph [name] MRN: [id number] DOB: 1951-08-20 Today's Date: 08/22/2017    History of present illness Pt admitted with sepsis. 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   OT comments  This 66 yo male admitted and underwent above presents to acute OT with all education completed and no further concerns about basic ADLs in acute care. Pt and wife to follow up on potential Up Walker for pt. Acute OT will sign off.   Follow Up Recommendations  Home health OT;Supervision/Assistance - 24 hour    Equipment Recommendations  None recommended by OT       Precautions / Restrictions Precautions Precautions: Fall Precaution Comments: watch O2 (96-97% on 2-3 liters today) Restrictions Weight Bearing Restrictions: No       Mobility Bed Mobility Overal bed mobility: Needs Assistance Bed Mobility: Supine to Sit     Supine to sit: Modified independent (Device/Increase time)     General bed mobility comments: Sitting on BSC upon entry   Transfers Overall transfer level: Needs assistance Equipment used: Bilateral platform walker Transfers: Sit to/from Stand Sit to Stand: Supervision Stand pivot transfers: Min guard       General transfer comment: pt ambulated 500 feet with S and Bil PFRW    Balance Overall balance assessment: Needs assistance Sitting-balance support: No upper extremity supported;Feet supported Sitting balance-Leahy Scale: Good     Standing balance support: No upper extremity supported;During functional activity;Bilateral upper extremity supported Standing balance-Leahy Scale: Fair Standing balance comment: Able to perform stand pivot without UE support.                            ADL either performed or assessed with clinical judgement   ADL Overall ADL's : Needs assistance/impaired                     Lower Body Dressing: Set up Lower Body Dressing Details  (indicate cue type and reason): donning shoes; S sit<>stand   Toilet Transfer Details (indicate cue type and reason): S simulated from bed>down hallway and back to sit on bed (with one rest break)--5oo ft           General ADL Comments: Pt's wife reports he has difficulty with pericare, but pt denies.(last session). Addressed this with pt this session and reports that this is no issue (that the only issue is that the pulse ox probe is on his left hand and this is the hand he wipes with )--I offered to move the probe and he declined due to that they had had a hard time getting it to work unthil they had put it on his left hand index finger. Discussed a product called an up walker with pt and wife and gave them some information on it.     Vision Patient Visual Report: No change from baseline            Cognition Arousal/Alertness: Awake/alert Behavior During Therapy: WFL for tasks assessed/performed Overall Cognitive Status: Within Functional Limits for tasks assessed                                          Exercises Exercises: General Lower Extremity;Other exercises General Exercises - Lower Extremity Mini-Sqauts: AROM;Both;10 reps;Standing Other Exercises Other Exercises: Standing hip extension using PFRW; X5 BLE.  Cues for correct technique.       General Comments Pt requesting printed HEP, so made HEP handout and administered to pt at end of session.     Pertinent Vitals/ Pain       Pain Assessment: No/denies pain         Frequency  Min 2X/week        Progress Toward Goals  OT Goals(current goals can now be found in the care plan section)  Progress towards OT goals: (all education completed)  Acute Rehab OT Goals Patient Stated Goal: to go to pulmonary rehab and get his strength back  Plan Discharge plan remains appropriate       AM-PAC PT "6 Clicks" Daily Activity     Outcome Measure   Help from another person eating meals?: None Help  from another person taking care of personal grooming?: A Little Help from another person toileting, which includes using toliet, bedpan, or urinal?: A Little Help from another person bathing (including washing, rinsing, drying)?: A Little Help from another person to put on and taking off regular upper body clothing?: A Little Help from another person to put on and taking off regular lower body clothing?: A Little 6 Click Score: 19    End of Session Equipment Utilized During Treatment: Oxygen(3 liters)  OT Visit Diagnosis: Unsteadiness on feet (R26.81);Other abnormalities of gait and mobility (R26.89);Muscle weakness (generalized) (M62.81)   Activity Tolerance Patient tolerated treatment well   Patient Left (sitting EOB with wife in room)   Nurse Communication Patient requests pain meds(for headache)        Time: 3235-5732 OT Time Calculation (min): 30 min  Charges: OT General Charges $OT Visit: 1 Visit OT Treatments $Self Care/Home Management : 23-37 mins Ignacia Palma, OTR/L [phone] 08/22/2017

## 2017-08-22 NOTE — Progress Notes (Signed)
Social visit with pt.  He is being discharged today.  Will set up follow up appt with pulmonary in 2 to 3 weeks and arrange for referral to pulmonary rehab at [facility].  [name], MD [facility] Pulmonary/Critical Care 08/22/2017, 5:18 PM Pager:  [phone] After 3pm call: [phone]

## 2017-08-22 NOTE — Discharge Summary (Signed)
Physician Discharge Summary  ACIE CUSTIS WUJ:[id number] DOB: Jun 08, 1951 DOA: 08/14/2017  PCP: Wanda Plump, MD  Admit date: 08/14/2017 Discharge date: 08/22/2017  Time spent: 35 minutes  Recommendations for Outpatient Follow-up:   Sepsis due to unspecified organism -Complete course of antibiotics (to complete 8 days of Cefepime 12/27 last day).  Patient immunocompromised from long-term steroid/immunosuppressant use for his rheumatoid arthritis. -All cultures negative to date -follow up in 1-2 weeks with Dr. Porfirio Oar sepsis,acute on chronic respiratory failure with hypoxia,chronic diastolic CHF.   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 -Prednisone 30 mg daily until 7January then decrease to 20 mg daily until patient sees his rheumatologist  On 18 January -follow up with Dr. Craige Cotta [facility] M in 1-2 weeks acute on chronic respiratory failure with Hypoxia   Rheumatoid Arthritis -Patient on long-term Methotrexate: Per patient instructed by rheumatologists to discontinue until he sees him at follow-up on 18 January -per patient has follow-up appointment with Rheumatologist on 18 January    Chronic diastolic CHF -Strict in and out since admission +7.7 L -Daily weight Filed Weights   08/20/17 0532 08/21/17 0429 08/22/17 0458  Weight: 224 lb (101.6 kg) 212 lb 14.4 oz (96.6 kg) 208 lb 1.8 oz (94.4 kg)    Diabetes type 2 controlled with complication -Unsure of true diabetes or iatrogenic secondary to patient's long history of steroid use: Treatment would be the same. - 12/21 Hemoglobin A1c= 6.1  -Lantus 10 units daily   Headache (migraine?) -Sumatriptan 50 mg PRN HA    Explosive watery Diarrhea -DC Senokot if patient continues to have diarrhea  will evaluate for C. difficile   Goals of care - Patient requests that he be discharged to [facility].  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 ), gastroparesis   Chronic respiratory failure with hypoxia (HCC)   Chronic diastolic heart failure (HCC)   Pressure injury of skin   Discharge Condition: stable  Diet recommendation: American diabetic Association  Filed Weights   08/20/17 0532 08/21/17 0429 08/22/17 0458  Weight: 224 lb (101.6 kg) 212 lb 14.4 oz (96.6 kg) 208 lb 1.8 oz (94.4 kg)    History of present illness:  66 year old WM PMHx Peripheral Neuropathy, CAD, HTN, COPD O2 dependent 3 L via [place],  Diabetes mellitus uncontrolled with complication,Gastroparesis, Esophageal dysmotility, Fatty liver (10/11/09), GERD  Hiatal hernia, Psoriasis, and Rheumatoid arthritis .    Admitted on 08/14/17 with concerns about septic shock in the setting of fevers up to 106, headache, diarrhea malaise and persistent hypotension despite IV fluids. H/o rheumatoid arthritis (on daily prednisone and methotrexate), COPD/ bronchiectasis on 3 L oxygen, diabetes, GERD/Barretts, recurrent pneumonia secondary to chronic aspiration/reflux, and coronary artery disease. Pt had a URI/sinus symptoms presented to pulmonary clinic for this and was written a prescription for Augmentin.  His symptoms progressed and he developed high fevers (106), diarrhea, headache, malaise.    Symptoms were felt to be infectious in origin and potentially meningitis.  He had LP done in the emergency department and was started on  broad-spectrum antibiotic.  He was hypotensive despite IV fluid bolus and PCCM admitted him on 08/14/17, transferred to triad on 08/19/17   During  this admission treated for sepsis due to unspecified organism, acute on chronic respiratory failure with hypoxia. Patient's treatment, complicated by the fact patient has been on long-term immunosuppressants secondary to severe rheumatoid arthritis. Patient responded well to antibiotics but remained somewhat debilitated. Will be discharged with home health, RN, PT, aide. Agree that patient would benefit from pulmonary rehabilitation. Unfortunately unable to setup outpatient pulmonary rehabilitation with [facility] pulmonary rehabilitation. Patient and wife had been counseled to request their PCP refer them for rehabilitation at requested center.    Procedures: 12/20 LP   Consultations: [facility]  Cultures  12/20 blood negative 12/20 urine negative 12/20 CSF negative 12/20 MRSA by PCR negative 12/20 HIV 1 and 2 antibody negative 12/20 PCR negative    Antibiotics Anti-infectives (From admission, onward)   Start     Stop   08/14/17 2200  azithromycin (ZITHROMAX) 500 mg in dextrose 5 % 250 mL IVPB  Status:  Discontinued     08/15/17 1112   08/14/17 2000  cefTRIAXone (ROCEPHIN) 2 g in dextrose 5 % 50 mL IVPB  Status:  Discontinued     08/14/17 0554   08/14/17 0800  ampicillin (OMNIPEN) 2 g in sodium chloride 0.9 % 50 mL IVPB  Status:  Discontinued     08/15/17 1112   08/14/17 0600  acyclovir (ZOVIRAX) 1,000 mg in dextrose 5 % 150 mL IVPB  Status:  Discontinued     08/15/17 1112   08/14/17 0600  vancomycin (VANCOCIN) 1,250 mg in sodium chloride 0.9 % 250 mL IVPB  Status:  Discontinued     08/17/17 1531   08/14/17 0600  ceFEPIme (MAXIPIME) 2 g in dextrose 5 % 50 mL IVPB     08/21/17 2221   08/14/17 0345  vancomycin (VANCOCIN) IVPB 1000 mg/200 mL premix  Status:  Discontinued     08/14/17 0550   08/14/17 0345  ampicillin (OMNIPEN) 2 g in sodium chloride 0.9 % 50 mL IVPB     08/14/17 0607   08/14/17 0345  cefTRIAXone (ROCEPHIN) 1 g in dextrose 5 % 50 mL IVPB     08/14/17 0544   08/14/17  0145  cefTRIAXone (ROCEPHIN) 1 g in dextrose 5 % 50 mL IVPB     08/14/17 0246   08/14/17 0145  azithromycin (ZITHROMAX) 500 mg in dextrose 5 % 250 mL IVPB     08/14/17 0349       Discharge Exam: Vitals:   08/22/17 0739 08/22/17 0906 08/22/17 1206 08/22/17 1532  BP: (!) 147/76  108/78 116/80  Pulse: 81  95 93  Resp: 17  16 17   Temp: 98.2 F (36.8 C)  98.3 F (36.8 C) 99.8 F (37.7 C)  TempSrc: Oral  Oral Oral  SpO2: 95% 96% 95% 97%  Weight:      Height:        General: A/O x4, positive chronic respiratory distress Neck:  Negative scars, masses, torticollis, lymphadenopathy, JVD Lungs: Clear to auscultation bilaterally without wheezes or crackles Cardiovascular: Regular rate and rhythm without murmur gallop or rub normal S1 and S2  Discharge Instructions   Allergies as of 08/22/2017   No Known Allergies     Medication List    STOP taking these medications   amoxicillin-clavulanate 875-125 MG tablet Commonly known as:  AUGMENTIN   methotrexate 50 MG/2ML injection   metoprolol tartrate 25  MG tablet Commonly known as:  LOPRESSOR     TAKE these medications   albuterol 108 (90 Base) MCG/ACT inhaler Commonly known as:  PROAIR HFA Inhale 2 puffs into the lungs every 4 (four) hours as needed for wheezing or shortness of breath.   amitriptyline 10 MG tablet Commonly known as:  ELAVIL Take 10 mg by mouth at bedtime.   aspirin EC 81 MG tablet Take 81 mg by mouth daily.   atorvastatin 80 MG tablet Commonly known as:  LIPITOR TAKE 1 TABLET(80 MG) BY MOUTH DAILY   B-D TB SYRINGE 1CC/27GX1/2" 27G X 1/2" 1 ML Misc Generic drug:  TUBERCULIN SYR 1CC/27GX1/2" Reported on 02/16/2016   benzonatate 100 MG capsule Commonly known as:  TESSALON Take 1 capsule (100 mg total) by mouth 2 (two) times daily as needed for cough.   clobetasol cream 0.05 % Commonly known as:  TEMOVATE Apply 1 application topically 2 (two) times daily as needed (rash).    dextromethorphan-guaiFENesin 30-600 MG 12hr tablet Commonly known as:  MUCINEX DM Take 1 tablet by mouth 2 (two) times daily.   fluticasone 50 MCG/ACT nasal spray Commonly known as:  FLONASE Place 2 sprays into both nostrils 2 (two) times daily.   Fluticasone-Umeclidin-Vilant 100-62.5-25 MCG/INH Aepb Commonly known as:  TRELEGY ELLIPTA Inhale 1 puff daily into the lungs.   folic acid 800 MCG tablet Commonly known as:  FOLVITE Take 800 mcg by mouth daily.   furosemide 20 MG tablet Commonly known as:  LASIX Take 2 tablets (40 mg total) by mouth daily. What changed:  See the new instructions.   hydrocortisone cream 1 % Apply 1 application topically 3 (three) times daily as needed for itching (skin irritation).   insulin glargine 100 unit/mL Sopn Commonly known as:  LANTUS Inject 0.1 mLs (10 Units total) into the skin at bedtime.   ipratropium-albuterol 0.5-2.5 (3) MG/3ML Soln Commonly known as:  DUONEB Take 3 mLs by nebulization every 4 (four) hours as needed.   LOTEMAX 0.5 % Gel Generic drug:  Loteprednol Etabonate Place 1 drop into both eyes every morning. daily   nitroGLYCERIN 0.4 MG SL tablet Commonly known as:  NITROSTAT Place 1 tablet (0.4 mg total) under the tongue every 5 (five) minutes as needed for chest pain (up to 3 doses for severe chest pain. If no relief after 3rd dose, proceed to the ED for an evaluation).   NON FORMULARY EVERGO PORTABLE PULSE DOSE OXYGEN oxygen 3L per nasal cannula 24 hours a day Dx: 496   ONE TOUCH ULTRA TEST test strip Generic drug:  glucose blood USE TO TEST BLOOD SUGAR NO MORE THAN TWICE DAILY   onetouch ultrasoft lancets USE TO TEST BLOOD SUGAR NO MORE THAN TWICE DAILY   oxyCODONE-acetaminophen 7.5-325 MG tablet Commonly known as:  PERCOCET Take 1 tablet by mouth 3 (three) times daily. 3 times a day   pantoprazole 40 MG tablet Commonly known as:  PROTONIX Take 1 tablet (40 mg total) by mouth 2 (two) times daily.   Pen  Needles 29G X Misc Use as directed   PERFECT IRON PO Take 1 tablet by mouth every morning.   potassium chloride 10 MEQ tablet Commonly known as:  K-DUR Take 4 tablets (40 mEq total) by mouth daily. What changed:  See the new instructions.   predniSONE 10 MG tablet Commonly known as:  DELTASONE Take 3 tablets (30 mg total) by mouth daily with breakfast for 7 days. Start taking on:  08/23/2017 What changed:  medication strength  how much to take  when to take this  additional instructions   predniSONE 10 MG tablet Commonly known as:  DELTASONE Take 2 tablets (20 mg total) by mouth daily with breakfast for 21 days. Start taking on:  08/31/2017 What changed:  You were already taking a medication with the same name, and this prescription was added. Make sure you understand how and when to take each.   pregabalin 75 MG capsule Commonly known as:  LYRICA Take 75 mg by mouth 2 (two) times daily.   SUMAtriptan 50 MG tablet Commonly known as:  IMITREX Take 1 tablet (50 mg total) by mouth daily as needed for migraine. May repeat in 2 hours if headache persists or recurs.   SUPER B COMPLEX PO Take 1 tablet by mouth daily.   vitamin C 500 MG tablet Commonly known as:  ASCORBIC ACID Take 500 mg by mouth daily.   zolpidem 10 MG tablet Commonly known as:  AMBIEN Take 1 tablet (10 mg total) by mouth at bedtime.      No Known Allergies Follow-up Information    [facility] Follow up.   Specialty:  Home Health Services Why:  For home health services, will be in contact in the next 1-2 days to set up first home visit.  Contact information: [address] [place] [address] [phone]        [name], MD. Schedule an appointment as soon as possible for a visit in 2 week(s).   Specialty:  Pulmonary Disease Why:  follow up with Dr. Craige Cotta [facility] in 1-2 weeks acute on chronic respiratory failure with hypoxia Contact  information: [address] [place] [address] [phone]        Wanda Plump, MD. Schedule an appointment as soon as possible for a visit in 2 week(s).   Specialty:  Internal Medicine Why:  follow up in 1-2 weeks with Dr. Porfirio Oar sepsis,acute on chronic respiratory failure with hypoxia,chronic diastolic CHF. Contact information: [address] [place] [address] [phone]            The results of significant diagnostics from this hospitalization (including imaging, microbiology, ancillary and laboratory) are listed below for reference.    Significant Diagnostic Studies: Ct Abdomen Pelvis Wo Contrast  Result Date: 08/14/2017 CLINICAL DATA:  Acute respiratory illness with sepsis EXAM: CT CHEST, ABDOMEN AND PELVIS WITHOUT CONTRAST TECHNIQUE: Multidetector CT imaging of the chest, abdomen and pelvis was performed following the standard protocol without IV contrast. COMPARISON:  08/14/2017, 10/11/2009 FINDINGS: CT CHEST FINDINGS Cardiovascular: Limited without intravenous contrast. Moderate-to-marked atherosclerotic calcification of the aorta. No aneurysmal dilatation. Post CABG changes. Coronary artery calcification. Heart size upper normal. No pericardial effusion. Mediastinum/Nodes: Endotracheal tube tip terminate just above the carina. Esophageal tube tip is within the gastric outlet. No thyroid mass. Nonspecific subcentimeter mediastinal lymph nodes. Lungs/Pleura: Trace pleural effusion. Atelectasis within the posterior lung bases. Moderate to marked emphysema with large bulla in the upper lobes. Negative for a pneumothorax. Musculoskeletal: Postsurgical changes of the sternum. Degenerative changes of the spine. No acute or suspicious lesion CT ABDOMEN PELVIS FINDINGS Hepatobiliary: Surgical clips in the gallbladder fossa. No focal hepatic abnormality or biliary dilatation. Suspected small stones in the distal common bile duct, measuring up to 7 mm. Pancreas:  Unremarkable. No pancreatic ductal dilatation or surrounding inflammatory changes. Spleen: Normal in size without focal abnormality. Adrenals/Urinary Tract: Adrenal glands are within normal limits.  No hydronephrosis. Nonspecific perinephric fat stranding. Multiple right greater than left intrarenal calculi, largest stone on the right is seen in the upper pole and measures 6 mm. Largest stone on the left is seen in the upper pole and measures 5 mm. Foley catheter and air in the empty bladder. Stomach/Bowel: Stomach is within normal limits. Appendix appears normal. No evidence of bowel wall thickening, distention, or inflammatory changes. Vascular/Lymphatic: Moderate to marked atherosclerotic vascular disease. No aneurysmal dilatation. No significant abdominal or pelvic adenopathy Reproductive: Prostate is unremarkable. Other: Small fat in the inguinal canals. No free air or free fluid. Small fat in the umbilicus. Musculoskeletal: Degenerative changes. No acute or suspicious abnormality IMPRESSION: 1. Support lines and tubes as above. Tip of the endotracheal tube terminates just above the carina. 2. Trace pleural effusions and atelectasis in the bilateral lung bases. Moderate severe emphysema 3. No CT evidence for acute intra-abdominal or pelvic abnormality 4. Status post cholecystectomy. Suspect that there is choledocholithiasis. No significant biliary dilatation. 5. Multiple bilateral nonobstructing stones within the kidneys. Electronically Signed   By: Jasmine Pang M.D.   On: 08/14/2017 20:59   Ct Head Wo Contrast  Result Date: 08/14/2017 CLINICAL DATA:  Altered mental status and fever EXAM: CT HEAD WITHOUT CONTRAST TECHNIQUE: Contiguous axial images were obtained from the base of the skull through the vertex without intravenous contrast. COMPARISON:  Head CT April 25, 2006; brain MRI April 27, 2006 FINDINGS: Brain: The ventricles are normal in size and configuration. There is no appreciable intracranial  mass, hemorrhage, extra-axial fluid collection, or midline shift. There is an age uncertain infarct in the anterior limb of the left internal capsule. Elsewhere gray-white compartments appear normal. Vascular: No hyperdense vessel. There is calcification in each carotid siphon region. Skull: Bony calvarium appears intact. Sinuses/Orbits: There is extensive opacification of most of the right frontal sinus. There is opacification of multiple ethmoid air cells bilaterally. There is opacification of much of the right maxillary antrum with an air-fluid level on the right. There is moderate mucosal thickening in the left maxillary antrum. There is air-fluid level in the right sphenoid sinus. Orbits appear symmetric bilaterally. Other: Mastoid air cells are clear. IMPRESSION: 1. Age uncertain and possibly recent infarct involving the anterior limb of the left internal capsule. Elsewhere gray-white compartments appear normal. No mass or hemorrhage. 2. Multifocal paranasal sinus disease with air-fluid levels in the right sphenoid and right maxillary sinuses. 3.  Foci of arterial vascular calcification noted. Electronically Signed   By: Bretta Bang III M.D.   On: 08/14/2017 02:53   Ct Chest Wo Contrast  Result Date: 08/14/2017 CLINICAL DATA:  Acute respiratory illness with sepsis EXAM: CT CHEST, ABDOMEN AND PELVIS WITHOUT CONTRAST TECHNIQUE: Multidetector CT imaging of the chest, abdomen and pelvis was performed following the standard protocol without IV contrast. COMPARISON:  08/14/2017, 10/11/2009 FINDINGS: CT CHEST FINDINGS Cardiovascular: Limited without intravenous contrast. Moderate-to-marked atherosclerotic calcification of the aorta. No aneurysmal dilatation. Post CABG changes. Coronary artery calcification. Heart size upper normal. No pericardial effusion. Mediastinum/Nodes: Endotracheal tube tip terminate just above the carina. Esophageal tube tip is within the gastric outlet. No thyroid mass. Nonspecific  subcentimeter mediastinal lymph nodes. Lungs/Pleura: Trace pleural effusion. Atelectasis within the posterior lung bases. Moderate to marked emphysema with large bulla in the upper lobes. Negative for a pneumothorax. Musculoskeletal: Postsurgical changes of the sternum. Degenerative changes of the spine. No acute or suspicious lesion CT ABDOMEN PELVIS FINDINGS Hepatobiliary: Surgical clips in the gallbladder fossa. No focal hepatic  abnormality or biliary dilatation. Suspected small stones in the distal common bile duct, measuring up to 7 mm. Pancreas: Unremarkable. No pancreatic ductal dilatation or surrounding inflammatory changes. Spleen: Normal in size without focal abnormality. Adrenals/Urinary Tract: Adrenal glands are within normal limits. No hydronephrosis. Nonspecific perinephric fat stranding. Multiple right greater than left intrarenal calculi, largest stone on the right is seen in the upper pole and measures 6 mm. Largest stone on the left is seen in the upper pole and measures 5 mm. Foley catheter and air in the empty bladder. Stomach/Bowel: Stomach is within normal limits. Appendix appears normal. No evidence of bowel wall thickening, distention, or inflammatory changes. Vascular/Lymphatic: Moderate to marked atherosclerotic vascular disease. No aneurysmal dilatation. No significant abdominal or pelvic adenopathy Reproductive: Prostate is unremarkable. Other: Small fat in the inguinal canals. No free air or free fluid. Small fat in the umbilicus. Musculoskeletal: Degenerative changes. No acute or suspicious abnormality IMPRESSION: 1. Support lines and tubes as above. Tip of the endotracheal tube terminates just above the carina. 2. Trace pleural effusions and atelectasis in the bilateral lung bases. Moderate severe emphysema 3. No CT evidence for acute intra-abdominal or pelvic abnormality 4. Status post cholecystectomy. Suspect that there is choledocholithiasis. No significant biliary dilatation. 5.  Multiple bilateral nonobstructing stones within the kidneys. Electronically Signed   By: Jasmine PangKim  Fujinaga M.D.   On: 08/14/2017 20:59   Dg Chest Port 1 View  Result Date: 08/17/2017 CLINICAL DATA:  Respiratory failure EXAM: PORTABLE CHEST 1 VIEW COMPARISON:  Yesterday FINDINGS: Fairly stable inflation after extubation yesterday. There is interstitial opacity attributed to patient's severe emphysema, with thick wall bullae bilaterally. No acute opacity when compared to remote studies. No effusion or pneumothorax. Left IJ line with tip at the SVC level. IMPRESSION: 1. No acute finding after extubation. 2. COPD. Electronically Signed   By: Marnee SpringJonathon  Watts M.D.   On: 08/17/2017 06:51   Dg Chest Port 1 View  Result Date: 08/16/2017 CLINICAL DATA:  Respiratory failure EXAM: PORTABLE CHEST 1 VIEW COMPARISON:  Yesterday FINDINGS: Endotracheal tube tip between the clavicular heads and carina. Left IJ line with tip at the upper SVC. An orogastric tube reaches the stomach at least. Emphysema with thick walled bullae apically. Reticular opacities at the bases attributed to bronchitis and atelectasis based on CT yesterday. No edema, effusion, or pneumothorax. Cardiomegaly. Status post CABG. IMPRESSION: 1. Stable positioning of tubes and central line. 2. COPD. Electronically Signed   By: Marnee SpringJonathon  Watts M.D.   On: 08/16/2017 07:01   Dg Chest Port 1 View  Result Date: 08/15/2017 CLINICAL DATA:  Respiratory failure EXAM: PORTABLE CHEST 1 VIEW COMPARISON:  Chest x-rays dated 08/14/2017 and 08/13/2017. Comparison chest x-ray dated 03/07/2016. FINDINGS: Endotracheal tube is well positioned with tip just above the level of the carina. Left IJ central line is stable in position with tip at the level of the upper/mid SVC. Enteric tube passes below the diaphragm. Cardiomediastinal silhouette is stable. Atherosclerotic changes noted at the aortic arch. Median sternotomy wires appear intact and stable in alignment. Improved  aeration at each lung base compared to yesterday's study suggesting improved fluid status. Coarse lung markings persist at each lung base, stable compared to the older study of 03/07/2016 indicating chronic interstitial lung disease/fibrosis. IMPRESSION: 1. Improved aeration at each lung base compared to yesterday's study suggesting improved fluid status and/or resolved atelectasis. No new lung findings. 2. Coarse lung markings at each lung base, stable compared to the older study of 03/07/2016 indicating some  degree of chronic interstitial lung disease/fibrosis. 3. Support apparatus is stable in position. 4. Aortic atherosclerosis. Electronically Signed   By: Bary Richard M.D.   On: 08/15/2017 07:14   Dg Chest Port 1 View  Result Date: 08/14/2017 CLINICAL DATA:  Intubated EXAM: PORTABLE CHEST 1 VIEW COMPARISON:  08/14/2017, 02/10/2017 FINDINGS: Left-sided central venous catheter overlies the SVC origin. Interval intubation, tip of the endotracheal tube is about 3.3 cm superior to the carina. Esophageal tube extends below diaphragm but the tip is non included. Post sternotomy changes. Worsening interstitial and alveolar opacity in the bilateral lung bases. Emphysematous disease and scarring. Stable cardiomediastinal silhouette with atherosclerosis. No pneumothorax. IMPRESSION: 1. Endotracheal tube tip about 3.3 cm superior to carina. Esophageal tube extends below diaphragm but the tip is non included 2. Worsening interstitial and alveolar opacity at the bilateral lung bases which may reflect pulmonary edema or pneumonia 3. Emphysema Electronically Signed   By: Jasmine Pang M.D.   On: 08/14/2017 15:51   Dg Chest Port 1 View  Result Date: 08/14/2017 CLINICAL DATA:  Central line placement EXAM: PORTABLE CHEST 1 VIEW COMPARISON:  08/14/2017 FINDINGS: Left jugular central venous catheter with the tip projecting over the SVC. Bilateral chronic diffuse interstitial thickening. Bilateral bullous disease. No  focal consolidation or pneumothorax. Cardiomediastinal silhouette is stable. Prior CABG. IMPRESSION: 1. Left jugular central venous catheter with the tip projecting over the SVC. 2. Bilateral bullous disease. Electronically Signed   By: Elige Ko   On: 08/14/2017 10:10   Dg Chest Port 1 View  Result Date: 08/14/2017 CLINICAL DATA:  Altered mental status. EXAM: PORTABLE CHEST 1 VIEW COMPARISON:  Most recent radiographs 02/10/2017. Most recent CT 02/23/2016 FINDINGS: Post median sternotomy and CABG. Mild cardiomegaly. Unchanged mediastinal contours. Emphysema. Bleb in the left upper lobe. Scarring in the periphery of the right upper lobe. No consolidation, pleural fluid or pneumothorax. No pulmonary edema. IMPRESSION: 1. No acute abnormality. 2. Emphysema with multifocal scarring. 3. Stable mild cardiomegaly. Electronically Signed   By: Rubye Oaks M.D.   On: 08/14/2017 01:48   Dg Abd Portable 1v  Result Date: 08/14/2017 CLINICAL DATA:  66 year old male enteric tube placement. EXAM: PORTABLE ABDOMEN - 1 VIEW COMPARISON:  CT Abdomen and Pelvis 10/11/2009. Portable chest radiographs today and earlier. FINDINGS: Portable AP semi upright view at 1538 hours. Enteric tube courses to the stomach. The side holes at the low level of the distal gastric body. There is a paucity of other upper abdominal bowel gas. Cholecystectomy clips are new since 2011. Stable visible chest. IMPRESSION: Enteric tube placed into the stomach, side hole up the level of the distal gastric body. Electronically Signed   By: Odessa Fleming M.D.   On: 08/14/2017 15:57    Microbiology: Recent Results (from the past 240 hour(s))  Culture, blood (Routine x 2)     Status: None   Collection Time: 08/14/17  1:18 AM  Result Value Ref Range Status   Specimen Description BLOOD RIGHT ANTECUBITAL  Final   Special Requests   Final    BOTTLES DRAWN AEROBIC AND ANAEROBIC Blood Culture adequate volume   Culture NO GROWTH 5 DAYS  Final   Report  Status 08/19/2017 FINAL  Final  Culture, blood (Routine x 2)     Status: None   Collection Time: 08/14/17  1:22 AM  Result Value Ref Range Status   Specimen Description BLOOD LEFT ANTECUBITAL  Final   Special Requests   Final    BOTTLES DRAWN AEROBIC AND  ANAEROBIC Blood Culture adequate volume   Culture NO GROWTH 5 DAYS  Final   Report Status 08/19/2017 FINAL  Final  Urine culture     Status: None   Collection Time: 08/14/17  1:46 AM  Result Value Ref Range Status   Specimen Description URINE, CATHETERIZED  Final   Special Requests NONE  Final   Culture NO GROWTH  Final   Report Status 08/15/2017 FINAL  Final  CSF culture     Status: None   Collection Time: 08/14/17  4:56 AM  Result Value Ref Range Status   Specimen Description CSF  Final   Special Requests NONE  Final   Gram Stain   Final    WBC PRESENT, PREDOMINANTLY MONONUCLEAR NO ORGANISMS SEEN CYTOSPIN SMEAR    Culture NO GROWTH 3 DAYS  Final   Report Status 08/17/2017 FINAL  Final  MRSA PCR Screening     Status: None   Collection Time: 08/14/17  7:24 AM  Result Value Ref Range Status   MRSA by PCR NEGATIVE NEGATIVE Final    Comment:        The GeneXpert MRSA Assay (FDA approved for NASAL specimens only), is one component of a comprehensive MRSA colonization surveillance program. It is not intended to diagnose MRSA infection nor to guide or monitor treatment for MRSA infections.   C difficile quick scan w PCR reflex     Status: None   Collection Time: 08/14/17 12:29 PM  Result Value Ref Range Status   C Diff antigen NEGATIVE NEGATIVE Final   C Diff toxin NEGATIVE NEGATIVE Final   C Diff interpretation No C. difficile detected.  Final     Labs: Basic Metabolic Panel: Recent Labs  Lab 08/16/17 0511 08/16/17 1815 08/17/17 0430 08/17/17 1107 08/18/17 0511 08/19/17 0947 08/20/17 0844 08/21/17 0419  NA 141 139  --  135 138 139 140 138  K 2.1* 2.9*  --  2.7* 2.9* 2.6* 3.3* 3.7  CL 104 100*  --  99* 100*  99* 105 104  CO2 27 28  --  27 30 29 24 25   GLUCOSE 267* 242*  --  237* 223* 176* 160* 87  BUN 12 12  --  10 11 12 12 13   CREATININE 0.62 0.63  --  0.64 0.63 0.68 0.63 0.50*  CALCIUM 7.5* 7.5*  --  7.4* 8.2* 8.4* 8.6* 8.7*  MG 2.0 1.8 2.4  --  2.0  --   --   --   PHOS 1.4* 1.5* 2.2*  --  1.3*  --   --   --    Liver Function Tests: No results for input(s): AST, ALT, ALKPHOS, BILITOT, PROT, ALBUMIN in the last 168 hours. No results for input(s): LIPASE, AMYLASE in the last 168 hours. No results for input(s): AMMONIA in the last 168 hours. CBC: Recent Labs  Lab 08/16/17 0511 08/17/17 0430 08/18/17 0511 08/21/17 0419  WBC 5.4 5.5 3.9* 3.3*  NEUTROABS 4.1  --   --   --   HGB 11.1* 11.0* 10.1* 12.2*  HCT 33.1* 32.8* 31.0* 37.0*  MCV 87.6 87.9 89.6 90.7  PLT 70* 70* 79* 138*   Cardiac Enzymes: No results for input(s): CKTOTAL, CKMB, CKMBINDEX, TROPONINI in the last 168 hours. BNP: BNP (last 3 results) No results for input(s): BNP in the last 8760 hours.  ProBNP (last 3 results) No results for input(s): PROBNP in the last 8760 hours.  CBG: Recent Labs  Lab 08/21/17 1613 08/21/17 2217 08/22/17 2218  08/22/17 1204 08/22/17 1524  GLUCAP 181* 121* 94 165* 209*       Signed:  Carolyne Littles, MD [facility] [phone] pager

## 2017-08-22 NOTE — Telephone Encounter (Signed)
Pt being discharged 08/22/17.  Please schedule him for hospital follow up visit in 2 to 3 weeks with me or NP.  Please also send order for pulmonary rehab at [facility].  Can you diagnosis of Emphysema.

## 2017-08-22 NOTE — Progress Notes (Signed)
   08/22/17 1600  Clinical Encounter Type  Visited With Patient and family together  Visit Type Follow-up  Referral From Nurse  Consult/Referral To Chaplain  Spiritual Encounters  Spiritual Needs Emotional  Stress Factors  Patient Stress Factors Exhausted  Family Stress Factors Exhausted    Patient was in the hall-way practicing walking in readiness to discharge that is coming soon. Patient's wife was in the waiting room making a phone call. Later the wife joined in. We all had a very good discussion around just about every concern that arose. Patient and wife was very appreciative of the entire [facility] staff's support . They were looking forward to being discharged any time today or next day. I provided reflective listening, compassionate presence and prayer.  Landri Dorsainvil a Water quality scientist, [employer]

## 2017-08-22 NOTE — Progress Notes (Signed)
Physical Therapy Treatment Patient Details Name: Joseph [name] MRN: [id number] DOB: 1951-03-26 Today's Date: 08/22/2017    History of Present Illness Pt admitted with sepsis. 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    PT Comments    Pt continues to progress towards goals and increase ambulation tolerance. Limited by fatigue and required standing rests during gait training. Min guard and use of platform RW for mobility this session. Administered printed HEP as well, per pt request. Current recommendations appropriate. Will continue to follow acutely to maximize functional mobility independence and safety.    Follow Up Recommendations  Home health PT     Equipment Recommendations  None recommended by PT    Recommendations for Other Services       Precautions / Restrictions Precautions Precautions: Fall Precaution Comments: watch O2 Restrictions Weight Bearing Restrictions: No    Mobility  Bed Mobility               General bed mobility comments: Sitting on BSC upon entry   Transfers Overall transfer level: Needs assistance Equipment used: Bilateral platform walker;None Transfers: Sit to/from Stand;Stand Pivot Transfers Sit to Stand: Min guard Stand pivot transfers: Min guard       General transfer comment: Min guard for steadying assist. Performed stand pivot from BSC to EOB without AD and required min guard for steadying. Demonstrated safe hand placement throughout.   Ambulation/Gait Ambulation/Gait assistance: Min guard Ambulation Distance (Feet): 400 Feet Assistive device: Bilateral platform walker Gait Pattern/deviations: Step-through pattern;Trunk flexed Gait velocity: Decreased Gait velocity interpretation: Below normal speed for age/gender General Gait Details: Slow, overall steady gait with use of platform RW. Required standing rest X 2 secondary to increased fatigue. Sats above 90% on 3L during gait. HR ranged from 100-low 120's during  ambulation. Cues for upright posture during gait.    Stairs            Wheelchair Mobility    Modified Rankin (Stroke Patients Only)       Balance Overall balance assessment: Needs assistance Sitting-balance support: No upper extremity supported;Feet supported Sitting balance-Leahy Scale: Good     Standing balance support: No upper extremity supported;During functional activity;Bilateral upper extremity supported Standing balance-Leahy Scale: Fair Standing balance comment: Able to perform stand pivot without UE support.                             Cognition Arousal/Alertness: Awake/alert Behavior During Therapy: WFL for tasks assessed/performed Overall Cognitive Status: Within Functional Limits for tasks assessed                                        Exercises General Exercises - Lower Extremity Mini-Sqauts: AROM;Both;10 reps;Standing Other Exercises Other Exercises: Standing hip extension using PFRW; X5 BLE. Cues for correct technique.     General Comments General comments (skin integrity, edema, etc.): Pt requesting printed HEP, so made HEP handout and administered to pt at end of session.       Pertinent Vitals/Pain Pain Assessment: No/denies pain    Home Living                      Prior Function            PT Goals (current goals can now be found in the care plan section) Acute Rehab PT Goals  Patient Stated Goal: to go to pulmonary rehab and get his strength back PT Goal Formulation: With patient Time For Goal Achievement: 08/27/17 Potential to Achieve Goals: Good Progress towards PT goals: Progressing toward goals    Frequency    Min 3X/week      PT Plan Current plan remains appropriate    Co-evaluation              AM-PAC PT "6 Clicks" Daily Activity  Outcome Measure  Difficulty turning over in bed (including adjusting bedclothes, sheets and blankets)?: A Little Difficulty moving from lying  on back to sitting on the side of the bed? : A Little Difficulty sitting down on and standing up from a chair with arms (e.g., wheelchair, bedside commode, etc,.)?: Unable Help needed moving to and from a bed to chair (including a wheelchair)?: A Little Help needed walking in hospital room?: A Little Help needed climbing 3-5 steps with a railing? : A Little 6 Click Score: 16    End of Session Equipment Utilized During Treatment: Gait belt;Oxygen Activity Tolerance: Patient tolerated treatment well Patient left: in bed;with call bell/phone within reach;Other (comment);with family/visitor present(Chaplain in room ) Nurse Communication: Mobility status PT Visit Diagnosis: Unsteadiness on feet (R26.81);Other abnormalities of gait and mobility (R26.89);Muscle weakness (generalized) (M62.81)     Time: 6606-3016 PT Time Calculation (min) (ACUTE ONLY): 39 min  Charges:  $Gait Training: 23-37 mins $Therapeutic Exercise: 8-22 mins                    G Codes:       Gladys Damme, PT, DPT  Acute Rehabilitation Services  Pager: [phone]    Lehman Prom 08/22/2017, 1:50 PM

## 2017-08-23 NOTE — Progress Notes (Addendum)
Spoke to patient's wife on the phone. She states that she has been in contact with a PT from [facility] who states that [facility] does not cover [facility]. She states that she spoke to [facility] since DC and they verified this. Not sure if she clearly communicated that the services were HH vs ALF/ SNF at [facility] when she spoke to [place]. Katharina Caper, clinical liaison w [facility] assured CM yesterday prior to discharge that all services that patient had requested and were ordered were covered and patient was accepted. CM spoke to wife several times in detail yesterday explaining HH coverage and services prior to DC.  Spoke w [facility] today who states they cannot accept referral for [facility]. Wife declined further assistance from this CM.   CM spoke to Katharina Caper and requested him to contact patient's wife and explain to her that [facility] will be able to provide [facility].  Kenard Gower called back and stated that he talked to the wife and she is at ease with [facility] following for home health.

## 2017-08-24 NOTE — Progress Notes (Signed)
Pt's wife, Britta Mccreedy, called and asked to speak to the charge Nurse. Britta Mccreedy explained previous situations and frustrations regarding the set up of home health for her husband. She stated that she spoke with Eunice Blase (case Production designer, theatre/television/film) and Kenard Gower [facility]) yesterday who both gave her conflicting information. Britta Mccreedy said that she understood from Kenard Gower that [facility] is out of network for her insurance coverage and was given four other agencies that were in network. She stated that prior to Drew's phone call she was told by Eunice Blase that she WAS covered and [facility] would be the home health agency. She voiced her frustration that she needed to have home health set up as soon as possible and asked if there was any way I could help her with this situation. I contacted Kenard Gower who stated that Britta Mccreedy was correct, that [facility] was not in network with her insurance but that he had referred and sent her husbands information to [facility] of [place] and they would be contacting her either today or tomorrow to get things arranged. Kenard Gower stated that he may have not communicated effectively to Eunice Blase that the patient was not in network for his agency as he told Eunice Blase he would handle the situation and she may have mistook him for saying [facility] would serve the patient. I spoke with Britta Mccreedy after speaking with Kenard Gower and explained that she was correct and that Kenard Gower had referred and sent the information to [place] and they would be reaching out to her to set up the home health arrangements. Britta Mccreedy called me back and informed me that she had been contacted by [place] and they would have things arranged after the first of the year. As of our last conversation, we seemed to have gotten things worked out and correctly arranged home health for [name].

## 2017-08-24 NOTE — Care Management (Signed)
Received call from Adacia, liaison for [facility] health.  [facility]. [facility] insurance agent contacted her because [facility] is in their network and [facility] is not.  Adacia states [facility] can start care after new year.

## 2017-08-25 ENCOUNTER — Telehealth: Payer: Self-pay

## 2017-08-25 ENCOUNTER — Telehealth: Payer: Self-pay | Admitting: Internal Medicine

## 2017-08-25 NOTE — Telephone Encounter (Signed)
Called pt and spoke with his wife, Britta Mccreedy letting her know that the appt that was scheduled with TP was the soonest we could get pt in for a HFU. Britta Mccreedy expressed understanding. Nothing further needed.  Did place order for pt to be referred to pulm rehab.

## 2017-08-25 NOTE — Telephone Encounter (Signed)
[facility] [phone]  She said that pt was discharged, they would like to have verbal orders to complete eval on pt. She said that home health is needed.    Please advise.

## 2017-08-25 NOTE — Telephone Encounter (Signed)
Spoke w/ Tanya- verbal orders given.  

## 2017-08-25 NOTE — Telephone Encounter (Signed)
Has an appointment 08/29/2017 for a TCM

## 2017-08-25 NOTE — Telephone Encounter (Signed)
08/25/17  TCM  Hospital Follow Up  Transition Care Management Follow-up Telephone Call  ADMISSION DATE: 08/14/17 DISCHARGE DATE: 08/22/17   How have you been since you were released from the hospital?  Tired and weak   Do you understand why you were in the hospital? Yes   Do you understand the discharge instrcutions? Yes    Items Reviewed:  Medications reviewed: Yes   Allergies reviewed: Yes   Dietary changes reviewed: Heart Healthy   Referrals reviewed: Appoitments scheduled   Functional Questionnaire:   Activities of Daily Living (ADLs): Can perform all per patient   Any patient concerns? Weakness, Stamina    Confirmed importance and date/time of follow-up visits scheduled:Yes   Confirmed with patient if condition begins to worsen call PCP or go to the ER. Yes    Patient was given the office number and encouragred to call back with questions or concerns. Yes

## 2017-08-25 NOTE — Telephone Encounter (Signed)
Please advise 

## 2017-08-25 NOTE — Telephone Encounter (Signed)
Please do

## 2017-08-25 NOTE — Telephone Encounter (Signed)
Britta Mccreedy, patient's wife called to schedule hospital follow up. CB [phone]. This was scheduled with TP on 09/10/2016.  Patient's wife is requesting hospital follow up with Dr. Isaiah Serge if possible since Dr. Craige Cotta does not have an opening.  She states Dr. Isaiah Serge saw the patient while in ICU. They not wishing to switch providers, just for this initial hospital follow up.

## 2017-08-28 DIAGNOSIS — E1143 Type 2 diabetes mellitus with diabetic autonomic (poly)neuropathy: Secondary | ICD-10-CM | POA: Diagnosis not present

## 2017-08-28 DIAGNOSIS — I110 Hypertensive heart disease with heart failure: Secondary | ICD-10-CM | POA: Diagnosis not present

## 2017-08-28 DIAGNOSIS — M810 Age-related osteoporosis without current pathological fracture: Secondary | ICD-10-CM | POA: Diagnosis not present

## 2017-08-28 DIAGNOSIS — J9611 Chronic respiratory failure with hypoxia: Secondary | ICD-10-CM | POA: Diagnosis not present

## 2017-08-28 DIAGNOSIS — M069 Rheumatoid arthritis, unspecified: Secondary | ICD-10-CM | POA: Diagnosis not present

## 2017-08-28 DIAGNOSIS — E1142 Type 2 diabetes mellitus with diabetic polyneuropathy: Secondary | ICD-10-CM | POA: Diagnosis not present

## 2017-08-28 DIAGNOSIS — L409 Psoriasis, unspecified: Secondary | ICD-10-CM | POA: Diagnosis not present

## 2017-08-28 DIAGNOSIS — I4892 Unspecified atrial flutter: Secondary | ICD-10-CM | POA: Diagnosis not present

## 2017-08-28 DIAGNOSIS — I2510 Atherosclerotic heart disease of native coronary artery without angina pectoris: Secondary | ICD-10-CM | POA: Diagnosis not present

## 2017-08-28 DIAGNOSIS — J439 Emphysema, unspecified: Secondary | ICD-10-CM | POA: Diagnosis not present

## 2017-08-28 DIAGNOSIS — I5020 Unspecified systolic (congestive) heart failure: Secondary | ICD-10-CM | POA: Diagnosis not present

## 2017-08-29 ENCOUNTER — Telehealth: Payer: Self-pay | Admitting: Internal Medicine

## 2017-08-29 ENCOUNTER — Encounter: Payer: Self-pay | Admitting: Internal Medicine

## 2017-08-29 ENCOUNTER — Telehealth: Payer: Self-pay

## 2017-08-29 ENCOUNTER — Ambulatory Visit (INDEPENDENT_AMBULATORY_CARE_PROVIDER_SITE_OTHER): Payer: Medicare HMO | Admitting: Internal Medicine

## 2017-08-29 VITALS — BP 116/76 | HR 87 | Temp 97.8°F | Resp 16 | Ht 72.0 in | Wt 205.5 lb

## 2017-08-29 DIAGNOSIS — M059 Rheumatoid arthritis with rheumatoid factor, unspecified: Secondary | ICD-10-CM | POA: Diagnosis not present

## 2017-08-29 DIAGNOSIS — I5032 Chronic diastolic (congestive) heart failure: Secondary | ICD-10-CM | POA: Diagnosis not present

## 2017-08-29 DIAGNOSIS — I2510 Atherosclerotic heart disease of native coronary artery without angina pectoris: Secondary | ICD-10-CM | POA: Diagnosis not present

## 2017-08-29 DIAGNOSIS — I4892 Unspecified atrial flutter: Secondary | ICD-10-CM | POA: Diagnosis not present

## 2017-08-29 DIAGNOSIS — R6521 Severe sepsis with septic shock: Secondary | ICD-10-CM | POA: Diagnosis not present

## 2017-08-29 DIAGNOSIS — J439 Emphysema, unspecified: Secondary | ICD-10-CM

## 2017-08-29 DIAGNOSIS — E1143 Type 2 diabetes mellitus with diabetic autonomic (poly)neuropathy: Secondary | ICD-10-CM | POA: Diagnosis not present

## 2017-08-29 DIAGNOSIS — A419 Sepsis, unspecified organism: Secondary | ICD-10-CM

## 2017-08-29 DIAGNOSIS — L409 Psoriasis, unspecified: Secondary | ICD-10-CM | POA: Diagnosis not present

## 2017-08-29 DIAGNOSIS — J9611 Chronic respiratory failure with hypoxia: Secondary | ICD-10-CM | POA: Diagnosis not present

## 2017-08-29 DIAGNOSIS — I110 Hypertensive heart disease with heart failure: Secondary | ICD-10-CM | POA: Diagnosis not present

## 2017-08-29 DIAGNOSIS — E1142 Type 2 diabetes mellitus with diabetic polyneuropathy: Secondary | ICD-10-CM | POA: Diagnosis not present

## 2017-08-29 DIAGNOSIS — M810 Age-related osteoporosis without current pathological fracture: Secondary | ICD-10-CM | POA: Diagnosis not present

## 2017-08-29 DIAGNOSIS — M069 Rheumatoid arthritis, unspecified: Secondary | ICD-10-CM | POA: Diagnosis not present

## 2017-08-29 LAB — COMPREHENSIVE METABOLIC PANEL
ALT: 34 U/L (ref 0–53)
AST: 33 U/L (ref 0–37)
Albumin: 3.5 g/dL (ref 3.5–5.2)
Alkaline Phosphatase: 64 U/L (ref 39–117)
BILIRUBIN TOTAL: 0.7 mg/dL (ref 0.2–1.2)
BUN: 10 mg/dL (ref 6–23)
CO2: 27 meq/L (ref 19–32)
Calcium: 9 mg/dL (ref 8.4–10.5)
Chloride: 99 mEq/L (ref 96–112)
Creatinine, Ser: 0.64 mg/dL (ref 0.40–1.50)
GFR: 133.16 mL/min (ref >60.00)
GLUCOSE: 98 mg/dL (ref 70–99)
Potassium: 3.6 mEq/L (ref 3.5–5.1)
Sodium: 135 mEq/L (ref 135–145)
Total Protein: 7.1 g/dL (ref 6.0–8.3)

## 2017-08-29 LAB — CBC WITH DIFFERENTIAL/PLATELET
BASOS ABS: 0 10*3/uL (ref 0.0–0.1)
Basophils Relative: 0.8 % (ref 0.0–3.0)
EOS PCT: 0.6 % (ref 0.0–5.0)
Eosinophils Absolute: 0 10*3/uL (ref 0.0–0.7)
HCT: 43.5 % (ref 39.0–52.0)
Hemoglobin: 14.3 g/dL (ref 13.0–17.0)
LYMPHS ABS: 1.9 10*3/uL (ref 0.7–4.0)
Lymphocytes Relative: 54.9 % — ABNORMAL HIGH (ref 12.0–46.0)
MCHC: 32.9 g/dL (ref 30.0–36.0)
MCV: 91.1 fl (ref 78.0–100.0)
MONOS PCT: 17.2 % — AB (ref 3.0–12.0)
Monocytes Absolute: 0.6 10*3/uL (ref 0.1–1.0)
NEUTROS PCT: 26.5 % — AB (ref 43.0–77.0)
Neutro Abs: 0.9 10*3/uL — ABNORMAL LOW (ref 1.4–7.7)
PLATELETS: 214 10*3/uL (ref 150.0–400.0)
RBC: 4.77 Mil/uL (ref 4.22–5.81)
RDW: 16.4 % — ABNORMAL HIGH (ref 11.5–15.5)
WBC: 3.5 10*3/uL — ABNORMAL LOW (ref 4.0–10.5)

## 2017-08-29 MED ORDER — AZELASTINE HCL 0.1 % NA SOLN: 2.0000 | Freq: Two times a day (BID) | NASAL | 12 refills | Status: DC

## 2017-08-29 NOTE — Progress Notes (Signed)
Pre visit review using our clinic review tool, if applicable. No additional management support is needed unless otherwise documented below in the visit note. 

## 2017-08-29 NOTE — Telephone Encounter (Signed)
Spoke w/ Pramod (sp?), verbal orders given.  

## 2017-08-29 NOTE — Telephone Encounter (Signed)
Copied from CRM [id number]. Topic: General - Other >> Aug 29, 2017  3:24 PM Oneal Grout wrote: Reason for CRM: Needing verbal orders for breathing exercises, balance and gait training 2x a week for 4 weeks

## 2017-08-29 NOTE — Telephone Encounter (Signed)
LMOM for Joseph [name], w/ verbal orders.

## 2017-08-29 NOTE — Telephone Encounter (Signed)
Copied from CRM [id number]. Topic: Quick Communication - See Telephone Encounter >> Aug 29, 2017 11:39 AM Floria Raveling A wrote: CRM for notification. See Telephone encounter for:  Darl Pikes from [facility] home health [phone] Need verbals for Nursing  2 week 2 1 week 4  08/29/17.

## 2017-08-29 NOTE — Progress Notes (Signed)
Subjective:    Patient ID: Joseph [name], male    DOB: [birth year], 67 y.o.   MRN: [id number]  DOS:  08/29/2017 Type of visit - description : Hospital follow-up Interval history: Admitted to the hospital 08/14/2017, discharge 8 days later. He presented with fever, headache, diarrhea, hypotension mental status changes Was DX with sepsis of unspecified organism in the context of immunosuppression from steroids and rheumatoid arthritis., completed antibiotics 08/21/2017 Had acute on chronic respiratory failure, was intubated. LP negative for meningitis CT abdomen and pelvis with no source of sepsis Last chest x-ray non acute  Wt Readings from Last 3 Encounters:  08/29/17 205 lb 8 oz (93.2 kg)  08/22/17 208 lb 1.8 oz (94.4 kg)  08/11/17 216 lb 6.4 oz (98.2 kg)    Review of Systems  He is here with his wife. Since he left the hospital he is back home. His most noticeable sx today is lack of energy. Denies fever chills No nausea, vomiting, diarrhea or blood in the stools No mental status changes She still has some cough with whitish sputum.  No hemoptysis. No dysuria or hematuria He had a headache before he go to the hospital and it continue, daily, mild (2/10), sometimes better with aspirin, usually located at the forehead. Admits to sinus congestion.  Past Medical History:  Diagnosis Date  . CAD (coronary artery disease)    MI 2007, CABG  . Cataracts, bilateral   . Complication of anesthesia    DIFFICULTY AFTER CABG HAD CONFUSION POST OP  . COPD (chronic obstructive pulmonary disease) (HCC)   . Diabetes mellitus    Dx 07-2008 A1C 6.2  . Esophageal dysmotility   . Fatty liver 10/11/09  . Gastroesophageal reflux disease   . Gastroparesis   . Hiatal hernia   . Hypertension   . Neuropathy    (related to RA per neuro note 05-2011)  . Onychomycosis   . Osteoporosis    per DEXA 05-2008  . Oxygen dependent    3l  . Peripheral polyneuropathy    Severe, causing problems  with his feet, see surgeries  . Psoriasis   . Rheumatoid arthritis [facility])    [facility] Rheumatology    Past Surgical History:  Procedure Laterality Date  . A-FLUTTER ABLATION N/A 11/27/2016   Procedure: A-Flutter Ablation;  Surgeon: Marinus Maw, MD;  Location: [facility] INVASIVE CV LAB;  Service: Cardiovascular;  Laterality: N/A;  . achiles tendon surgery  8/09  . CARDIAC CATHETERIZATION N/A 01/23/2016   Procedure: Left Heart Cath and Cors/Grafts Angiography;  Surgeon: Lyn Records, MD;  Location: [facility] INVASIVE CV LAB;  Service: Cardiovascular;  Laterality: N/A;  . CARDIOVERSION N/A 10/23/2016   Procedure: CARDIOVERSION;  Surgeon: Rollene Rotunda, MD;  Location: [facility] ENDOSCOPY;  Service: Cardiovascular;  Laterality: N/A;  . CATARACT EXTRACTION Right 03/2017  . CATARACT EXTRACTION Left 05/2017  . CHOLECYSTECTOMY    . CORONARY ARTERY BYPASS GRAFT  2007   (LIMA to the LAD, left radial to obtuse marginal, SVG to first diagonal, SVG to PDA. His ejection fraction to 50-55%  . ESOPHAGOGASTRODUODENOSCOPY N/A 10/19/2012   Procedure: ESOPHAGOGASTRODUODENOSCOPY (EGD);  Surgeon: Hart Carwin, MD;  Location: [facility] ENDOSCOPY;  Service: Endoscopy;  Laterality: N/A;  the dilatation is possible.   Marland Kitchen FOOT SURGERY     to tendon release and repair of osteomyelitis   . LARYNGOSCOPY N/A 01/21/2014   Procedure: LARYNGOSCOPY;  Surgeon: Christia Reading, MD;  Location: [facility] OR;  Service: ENT;  Laterality: N/A;  micro  direct laryngoscopy with prolaryn injection/jet venturi ventilation  . TOE AMPUTATION  4-12/ 3 14   d/t a deformity, found to have osteomyelitis, complicated by post-op foot strtess FX    Social History   Socioeconomic History  . Marital status: Married    Spouse name: Not on file  . Number of children: 0  . Years of education: Not on file  . Highest education level: Not on file  Social Needs  . Financial resource strain: Not on file  . Food insecurity - worry: Not on file  . Food insecurity - inability: Not on  file  . Transportation needs - medical: Not on file  . Transportation needs - non-medical: Not on file  Occupational History  . Occupation: Disabled-retired    Associate Professor: UNEMPLOYED  Tobacco Use  . Smoking status: Former Smoker    Packs/day: 1.00    Years: 40.00    Pack years: 40.00    Last attempt to quit: 08/26/2005    Years since quitting: 12.0  . Smokeless tobacco: Former Neurosurgeon    Quit date: 04/02/2006  . Tobacco comment: smoked x 40 years, 1 ppd  Substance and Sexual Activity  . Alcohol use: No  . Drug use: No  . Sexual activity: Not on file  Other Topics Concern  . Not on file  Social History Narrative   Household- pt and wife      Allergies as of 08/29/2017   No Known Allergies     Medication List        Accurate as of 08/29/17 11:59 PM. Always use your most recent med list.          albuterol 108 (90 Base) MCG/ACT inhaler Commonly known as:  PROAIR HFA Inhale 2 puffs into the lungs every 4 (four) hours as needed for wheezing or shortness of breath.   amitriptyline 10 MG tablet Commonly known as:  ELAVIL Take 10 mg by mouth at bedtime.   aspirin EC 81 MG tablet Take 81 mg by mouth daily.   atorvastatin 80 MG tablet Commonly known as:  LIPITOR TAKE 1 TABLET(80 MG) BY MOUTH DAILY   azelastine 0.1 % nasal spray Commonly known as:  ASTELIN Place 2 sprays into both nostrils 2 (two) times daily. Use in each nostril as directed   B-D TB SYRINGE 1CC/27GX1/2" 27G X 1/2" 1 ML Misc Generic drug:  TUBERCULIN SYR 1CC/27GX1/2" Reported on 02/16/2016   benzonatate 100 MG capsule Commonly known as:  TESSALON Take 1 capsule (100 mg total) by mouth 2 (two) times daily as needed for cough.   clobetasol cream 0.05 % Commonly known as:  TEMOVATE Apply 1 application topically 2 (two) times daily as needed (rash).   dextromethorphan-guaiFENesin 30-600 MG 12hr tablet Commonly known as:  MUCINEX DM Take 1 tablet by mouth 2 (two) times daily.   fluticasone 50 MCG/ACT nasal  spray Commonly known as:  FLONASE Place 2 sprays into both nostrils 2 (two) times daily.   Fluticasone-Umeclidin-Vilant 100-62.5-25 MCG/INH Aepb Commonly known as:  TRELEGY ELLIPTA Inhale 1 puff daily into the lungs.   folic acid 800 MCG tablet Commonly known as:  FOLVITE Take 800 mcg by mouth daily.   furosemide 20 MG tablet Commonly known as:  LASIX Take 2 tablets (40 mg total) by mouth daily.   hydrocortisone cream 1 % Apply 1 application topically 3 (three) times daily as needed for itching (skin irritation).   insulin glargine 100 unit/mL Sopn Commonly known as:  LANTUS Inject 0.1 mLs (  10 Units total) into the skin at bedtime.   ipratropium-albuterol 0.5-2.5 (3) MG/3ML Soln Commonly known as:  DUONEB Take 3 mLs by nebulization every 4 (four) hours as needed.   LOTEMAX 0.5 % Gel Generic drug:  Loteprednol Etabonate Place 1 drop into both eyes every morning. daily   nitroGLYCERIN 0.4 MG SL tablet Commonly known as:  NITROSTAT Place 1 tablet (0.4 mg total) under the tongue every 5 (five) minutes as needed for chest pain (up to 3 doses for severe chest pain. If no relief after 3rd dose, proceed to the ED for an evaluation).   NON FORMULARY EVERGO PORTABLE PULSE DOSE OXYGEN oxygen 3L per nasal cannula 24 hours a day Dx: 496   ONE TOUCH ULTRA TEST test strip Generic drug:  glucose blood USE TO TEST BLOOD SUGAR NO MORE THAN TWICE DAILY   onetouch ultrasoft lancets USE TO TEST BLOOD SUGAR NO MORE THAN TWICE DAILY   oxyCODONE-acetaminophen 7.5-325 MG tablet Commonly known as:  PERCOCET Take 1 tablet by mouth 3 (three) times daily. 3 times a day   pantoprazole 40 MG tablet Commonly known as:  PROTONIX Take 1 tablet (40 mg total) by mouth 2 (two) times daily.   Pen Needles 29G X Misc Use as directed   PERFECT IRON PO Take 1 tablet by mouth every morning.   potassium chloride 10 MEQ tablet Commonly known as:  K-DUR Take 4 tablets (40 mEq total) by mouth  daily.   predniSONE 10 MG tablet Commonly known as:  DELTASONE Take 3 tablets (30 mg total) by mouth daily with breakfast for 7 days.   predniSONE 10 MG tablet Commonly known as:  DELTASONE Take 2 tablets (20 mg total) by mouth daily with breakfast for 21 days.   pregabalin 75 MG capsule Commonly known as:  LYRICA Take 75 mg by mouth 2 (two) times daily.   SUMAtriptan 50 MG tablet Commonly known as:  IMITREX Take 1 tablet (50 mg total) by mouth daily as needed for migraine. May repeat in 2 hours if headache persists or recurs.   SUPER B COMPLEX PO Take 1 tablet by mouth daily.   vitamin C 500 MG tablet Commonly known as:  ASCORBIC ACID Take 500 mg by mouth daily.   zolpidem 10 MG tablet Commonly known as:  AMBIEN Take 1 tablet (10 mg total) by mouth at bedtime.          Objective:   Physical Exam BP 116/76 (BP Location: Left Arm, Patient Position: Sitting, Cuff Size: Small)   Pulse 87   Temp 97.8 F (36.6 C) (Oral)   Resp 16   Ht 6' (1.829 m)   Wt 205 lb 8 oz (93.2 kg)   SpO2 94%   BMI 27.87 kg/m   General:   Well developed, well nourished . NAD.  HEENT:  Normocephalic . Face symmetric, atraumatic.  Nose congested Lungs:  Rhonchi with cough Normal respiratory effort, no intercostal retractions, no accessory muscle use. Heart: Regular with occasional irregular heartbeat,  no murmur.  no pretibial edema bilaterally  Abdomen:  Not distended, soft, non-tender. No rebound or rigidity.  Enthesis exam made when the patient was sitting) Skin: Not pale. Not jaundice Neurologic:  alert & oriented X3.  Speech normal, gait tested  psych--  Cognition and judgment appear intact.  Cooperative with normal attention span and concentration.  Behavior appropriate. No anxious or depressed appearing.     Assessment & Plan:   Assessment  MSK: --RA --- Dr  Rivadeneyra --Psoriasis --Osteoporosis Peripheral neuropathy, severe, several feet  surgeries DM HTN Hyperlipidemia Pulmonary: --COPD --Pulmonary fibrosis --O2 24/ 7 --Chronic respiratory failure CV: --CAD, CHF, MI and  CABG 2007 --CP- CT chest no PE, cath 01-23-16, one occluded graft, rx medical mngmt  dx: question of pericarditis  --Pericarditis, admitted 01/2016 --New  onset A. Flutter 09-2016, admitted. Ablation 4-18, 12-2016: d/c OAC, rx asa 81; restart OAC if flutter return GI --Barrettts --GERD --esophageal dysmotility --Fatty liver Hoarseness , chronic, saw ENT , s/p botox 2015 , helped tempoarrily   PLAN Septic shock: Fortunately recovered, source unknown, finished antibiotics.  Doing well clinically.  Check a CMP and CBC Fatigue: Checking labs, fatigue is ,in a way, to be expected. COPD: Still has some congestion, last chest x-ray no acute, continue inhalers, rec to use nebs daily for the next few days to help clear the mucus. R.A.: Prior to sepsis he was taking 7.5 mg of steroids, currently 30 mg.  Like to decrease dose  ASAP.  Plan: Decrease to 25 mg daily for 3 days then 20 mg daily for 3 days and see Dr Sharren Bridge as planed in ~ 2 weeks Headache: As described above, CT head at the hospital (-) except for sinus congestion.  Continue Flonase, add Astelin. Neuropathy: Elavil did not help, on Lyrica which is definitely helping. CHF: Prior to being admitted to the hospital he was taking Lasix 40 mg in the morning and 20 in the afternoon, now 40 mg daily with only 4 KCl's daily.  He seems to be euvolemic.  Checking labs, no change. RTC 3 months

## 2017-08-29 NOTE — Patient Instructions (Signed)
GO TO THE LAB : Get the blood work     GO TO THE FRONT DESK Schedule your next appointment for a routine checkup in 3 months  decrease prednisone to 25 mg daily for the next 3 days Then decreased to 20 mg daily until you see your rheumatologist  Start Astelin 2 sprays on each side of the nose twice a day for your headaches Continue Flonase If you have severe headache let me know

## 2017-08-31 NOTE — Assessment & Plan Note (Signed)
Septic shock: Fortunately recovered, source unknown, finished antibiotics.  Doing well clinically.  Check a CMP and CBC Fatigue: Checking labs, fatigue is ,in a way, to be expected. COPD: Still has some congestion, last chest x-ray no acute, continue inhalers, rec to use nebs daily for the next few days to help clear the mucus. R.A.: Prior to sepsis he was taking 7.5 mg of steroids, currently 30 mg.  Like to decrease dose  ASAP.  Plan: Decrease to 25 mg daily for 3 days then 20 mg daily for 3 days and see Dr Sharren Bridge as planed in ~ 2 weeks Headache: As described above, CT head at the hospital (-) except for sinus congestion.  Continue Flonase, add Astelin. Neuropathy: Elavil did not help, on Lyrica which is definitely helping. CHF: Prior to being admitted to the hospital he was taking Lasix 40 mg in the morning and 20 in the afternoon, now 40 mg daily with only 4 KCl's daily.  He seems to be euvolemic.  Checking labs, no change. RTC 3 months

## 2017-09-01 ENCOUNTER — Telehealth: Payer: Self-pay | Admitting: Pulmonary Disease

## 2017-09-01 ENCOUNTER — Telehealth: Payer: Self-pay | Admitting: *Deleted

## 2017-09-01 MED ORDER — PREDNISONE 5 MG TABLET
ORAL_TABLET | Freq: Every day | ORAL | 3 refills | 0.00000 days | Status: CP
Start: 2017-09-01 — End: 2017-09-12

## 2017-09-01 MED ORDER — PREDNISONE 10 MG TABLET
ORAL_TABLET | 5 refills | 0 days | Status: CP
Start: 2017-09-01 — End: 2017-09-12

## 2017-09-01 NOTE — Telephone Encounter (Signed)
Pt is calling back. Would like to speak to a nurse about which nebulizer he should order. Cb is [phone].

## 2017-09-01 NOTE — Telephone Encounter (Signed)
A portable nebulizer should be fine to use.

## 2017-09-01 NOTE — Telephone Encounter (Signed)
Received results from [facility]; forwarded to provider/SLS 01/07

## 2017-09-01 NOTE — Telephone Encounter (Signed)
Called and spoke with pt who stated he is wanting to know what type of nebulizer he should get.  Pt stated he was put on duoneb, 32mL every 4 hours prn and is wanting to know if a portable and battery rechargeable nebulizer would be fine for pt to use or if there was another type of nebulizer that could be recommended for him.  Dr. Craige Cotta, please advise on this.  Thanks!

## 2017-09-02 ENCOUNTER — Other Ambulatory Visit: Payer: Self-pay

## 2017-09-02 ENCOUNTER — Emergency Department (HOSPITAL_COMMUNITY): Payer: Medicare HMO

## 2017-09-02 ENCOUNTER — Inpatient Hospital Stay (HOSPITAL_COMMUNITY)
Admission: EM | Admit: 2017-09-02 | Discharge: 2017-09-04 | DRG: 864 | Disposition: A | Discharge status: Home / Self Care | Payer: Medicare HMO | Attending: Internal Medicine | Admitting: Internal Medicine

## 2017-09-02 DIAGNOSIS — B9729 Other coronavirus as the cause of diseases classified elsewhere: Secondary | ICD-10-CM | POA: Diagnosis present

## 2017-09-02 DIAGNOSIS — L409 Psoriasis, unspecified: Secondary | ICD-10-CM | POA: Diagnosis present

## 2017-09-02 DIAGNOSIS — T380X5A Adverse effect of glucocorticoids and synthetic analogues, initial encounter: Secondary | ICD-10-CM | POA: Diagnosis present

## 2017-09-02 DIAGNOSIS — R0602 Shortness of breath: Secondary | ICD-10-CM | POA: Diagnosis not present

## 2017-09-02 DIAGNOSIS — I110 Hypertensive heart disease with heart failure: Secondary | ICD-10-CM | POA: Diagnosis present

## 2017-09-02 DIAGNOSIS — K760 Fatty (change of) liver, not elsewhere classified: Secondary | ICD-10-CM | POA: Diagnosis present

## 2017-09-02 DIAGNOSIS — M810 Age-related osteoporosis without current pathological fracture: Secondary | ICD-10-CM | POA: Diagnosis present

## 2017-09-02 DIAGNOSIS — J189 Pneumonia, unspecified organism: Secondary | ICD-10-CM

## 2017-09-02 DIAGNOSIS — W19XXXA Unspecified fall, initial encounter: Secondary | ICD-10-CM | POA: Diagnosis present

## 2017-09-02 DIAGNOSIS — R402413 Glasgow coma scale score 13-15, at hospital admission: Secondary | ICD-10-CM | POA: Diagnosis present

## 2017-09-02 DIAGNOSIS — D649 Anemia, unspecified: Secondary | ICD-10-CM | POA: Diagnosis present

## 2017-09-02 DIAGNOSIS — K3184 Gastroparesis: Secondary | ICD-10-CM | POA: Diagnosis present

## 2017-09-02 DIAGNOSIS — M25562 Pain in left knee: Secondary | ICD-10-CM | POA: Diagnosis present

## 2017-09-02 DIAGNOSIS — A419 Sepsis, unspecified organism: Secondary | ICD-10-CM | POA: Diagnosis not present

## 2017-09-02 DIAGNOSIS — G63 Polyneuropathy in diseases classified elsewhere: Secondary | ICD-10-CM | POA: Diagnosis present

## 2017-09-02 DIAGNOSIS — R918 Other nonspecific abnormal finding of lung field: Secondary | ICD-10-CM | POA: Diagnosis present

## 2017-09-02 DIAGNOSIS — D899 Disorder involving the immune mechanism, unspecified: Secondary | ICD-10-CM | POA: Diagnosis present

## 2017-09-02 DIAGNOSIS — I4891 Unspecified atrial fibrillation: Secondary | ICD-10-CM | POA: Diagnosis present

## 2017-09-02 DIAGNOSIS — J9610 Chronic respiratory failure, unspecified whether with hypoxia or hypercapnia: Secondary | ICD-10-CM | POA: Diagnosis present

## 2017-09-02 DIAGNOSIS — I2510 Atherosclerotic heart disease of native coronary artery without angina pectoris: Secondary | ICD-10-CM | POA: Diagnosis present

## 2017-09-02 DIAGNOSIS — K219 Gastro-esophageal reflux disease without esophagitis: Secondary | ICD-10-CM | POA: Diagnosis present

## 2017-09-02 DIAGNOSIS — J069 Acute upper respiratory infection, unspecified: Secondary | ICD-10-CM | POA: Diagnosis present

## 2017-09-02 DIAGNOSIS — R52 Pain, unspecified: Secondary | ICD-10-CM | POA: Diagnosis present

## 2017-09-02 DIAGNOSIS — J8410 Pulmonary fibrosis, unspecified: Secondary | ICD-10-CM | POA: Diagnosis present

## 2017-09-02 DIAGNOSIS — G629 Polyneuropathy, unspecified: Secondary | ICD-10-CM | POA: Diagnosis present

## 2017-09-02 DIAGNOSIS — Y92000 Kitchen of unspecified non-institutional (private) residence as  the place of occurrence of the external cause: Secondary | ICD-10-CM | POA: Diagnosis not present

## 2017-09-02 DIAGNOSIS — K449 Diaphragmatic hernia without obstruction or gangrene: Secondary | ICD-10-CM | POA: Diagnosis present

## 2017-09-02 DIAGNOSIS — Z7951 Long term (current) use of inhaled steroids: Secondary | ICD-10-CM

## 2017-09-02 DIAGNOSIS — Z7901 Long term (current) use of anticoagulants: Secondary | ICD-10-CM

## 2017-09-02 DIAGNOSIS — J440 Chronic obstructive pulmonary disease with acute lower respiratory infection: Secondary | ICD-10-CM | POA: Diagnosis present

## 2017-09-02 DIAGNOSIS — M059 Rheumatoid arthritis with rheumatoid factor, unspecified: Secondary | ICD-10-CM | POA: Diagnosis present

## 2017-09-02 DIAGNOSIS — A4189 Other specified sepsis: Secondary | ICD-10-CM | POA: Diagnosis present

## 2017-09-02 DIAGNOSIS — Z951 Presence of aortocoronary bypass graft: Secondary | ICD-10-CM

## 2017-09-02 DIAGNOSIS — Z79891 Long term (current) use of opiate analgesic: Secondary | ICD-10-CM

## 2017-09-02 DIAGNOSIS — E1143 Type 2 diabetes mellitus with diabetic autonomic (poly)neuropathy: Secondary | ICD-10-CM | POA: Diagnosis present

## 2017-09-02 DIAGNOSIS — T451X5A Adverse effect of antineoplastic and immunosuppressive drugs, initial encounter: Secondary | ICD-10-CM | POA: Diagnosis present

## 2017-09-02 DIAGNOSIS — M359 Systemic involvement of connective tissue, unspecified: Secondary | ICD-10-CM | POA: Diagnosis present

## 2017-09-02 DIAGNOSIS — R05 Cough: Secondary | ICD-10-CM | POA: Diagnosis not present

## 2017-09-02 DIAGNOSIS — Z794 Long term (current) use of insulin: Secondary | ICD-10-CM

## 2017-09-02 DIAGNOSIS — R6510 Systemic inflammatory response syndrome (SIRS) of non-infectious origin without acute organ dysfunction: Principal | ICD-10-CM | POA: Diagnosis present

## 2017-09-02 DIAGNOSIS — Z87891 Personal history of nicotine dependence: Secondary | ICD-10-CM

## 2017-09-02 DIAGNOSIS — J439 Emphysema, unspecified: Secondary | ICD-10-CM | POA: Diagnosis present

## 2017-09-02 DIAGNOSIS — Z7952 Long term (current) use of systemic steroids: Secondary | ICD-10-CM

## 2017-09-02 DIAGNOSIS — Z9981 Dependence on supplemental oxygen: Secondary | ICD-10-CM

## 2017-09-02 DIAGNOSIS — R69 Illness, unspecified: Secondary | ICD-10-CM | POA: Diagnosis not present

## 2017-09-02 DIAGNOSIS — K2270 Barrett's esophagus without dysplasia: Secondary | ICD-10-CM | POA: Diagnosis present

## 2017-09-02 DIAGNOSIS — M25561 Pain in right knee: Secondary | ICD-10-CM | POA: Diagnosis present

## 2017-09-02 DIAGNOSIS — I252 Old myocardial infarction: Secondary | ICD-10-CM

## 2017-09-02 DIAGNOSIS — Z66 Do not resuscitate: Secondary | ICD-10-CM | POA: Diagnosis present

## 2017-09-02 DIAGNOSIS — D696 Thrombocytopenia, unspecified: Secondary | ICD-10-CM | POA: Diagnosis present

## 2017-09-02 DIAGNOSIS — E1140 Type 2 diabetes mellitus with diabetic neuropathy, unspecified: Secondary | ICD-10-CM | POA: Diagnosis present

## 2017-09-02 DIAGNOSIS — Z79899 Other long term (current) drug therapy: Secondary | ICD-10-CM

## 2017-09-02 DIAGNOSIS — I5032 Chronic diastolic (congestive) heart failure: Secondary | ICD-10-CM | POA: Diagnosis present

## 2017-09-02 DIAGNOSIS — Z89429 Acquired absence of other toe(s), unspecified side: Secondary | ICD-10-CM

## 2017-09-02 HISTORY — DX: Other nonspecific abnormal finding of lung field: R91.8

## 2017-09-02 LAB — CBC
HCT: 37.5 % — ABNORMAL LOW (ref 39.0–52.0)
Hemoglobin: 12.3 g/dL — ABNORMAL LOW (ref 13.0–17.0)
MCH: 28.7 pg (ref 26.0–34.0)
MCHC: 32.8 g/dL (ref 30.0–36.0)
MCV: 87.6 fL (ref 78.0–100.0)
PLATELETS: 147 10*3/uL — AB (ref 150–400)
RBC: 4.28 MIL/uL (ref 4.22–5.81)
RDW: 15.8 % — AB (ref 11.5–15.5)
WBC: 2.4 10*3/uL — AB (ref 4.0–10.5)

## 2017-09-02 LAB — COMPREHENSIVE METABOLIC PANEL
ALT: 25 U/L (ref 17–63)
ANION GAP: 9 (ref 5–15)
AST: 32 U/L (ref 15–41)
Albumin: 3 g/dL — ABNORMAL LOW (ref 3.5–5.0)
Alkaline Phosphatase: 64 U/L (ref 38–126)
BUN: 10 mg/dL (ref 6–20)
CHLORIDE: 97 mmol/L — AB (ref 101–111)
CO2: 24 mmol/L (ref 22–32)
Calcium: 8.9 mg/dL (ref 8.9–10.3)
Creatinine, Ser: 0.61 mg/dL (ref 0.61–1.24)
GFR calc Af Amer: >60 mL/min (ref >60)
Glucose, Bld: 190 mg/dL — ABNORMAL HIGH (ref 65–99)
POTASSIUM: 3.7 mmol/L (ref 3.5–5.1)
SODIUM: 130 mmol/L — AB (ref 135–145)
Total Bilirubin: 1.2 mg/dL (ref 0.3–1.2)
Total Protein: 7.5 g/dL (ref 6.5–8.1)

## 2017-09-02 LAB — URINALYSIS, ROUTINE W REFLEX MICROSCOPIC
BILIRUBIN URINE: NEGATIVE
Glucose, UA: NEGATIVE mg/dL
HGB URINE DIPSTICK: NEGATIVE
Ketones, ur: NEGATIVE mg/dL
Leukocytes, UA: NEGATIVE
Nitrite: NEGATIVE
PROTEIN: NEGATIVE mg/dL
Specific Gravity, Urine: 1.008 (ref 1.005–1.030)
pH: 6 (ref 5.0–8.0)

## 2017-09-02 LAB — CBC WITH DIFFERENTIAL/PLATELET
BASOS PCT: 0 %
Basophils Absolute: 0 10*3/uL (ref 0.0–0.1)
Eosinophils Absolute: 0.1 10*3/uL (ref 0.0–0.7)
Eosinophils Relative: 2 %
HEMATOCRIT: 40 % (ref 39.0–52.0)
HEMOGLOBIN: 13.4 g/dL (ref 13.0–17.0)
Lymphocytes Relative: 40 %
Lymphs Abs: 0.9 10*3/uL (ref 0.7–4.0)
MCH: 29.3 pg (ref 26.0–34.0)
MCHC: 33.5 g/dL (ref 30.0–36.0)
MCV: 87.5 fL (ref 78.0–100.0)
MONO ABS: 0.3 10*3/uL (ref 0.1–1.0)
Monocytes Relative: 12 %
NEUTROS ABS: 1.1 10*3/uL — AB (ref 1.7–7.7)
NEUTROS PCT: 46 %
Platelets: 143 10*3/uL — ABNORMAL LOW (ref 150–400)
RBC: 4.57 MIL/uL (ref 4.22–5.81)
RDW: 16 % — AB (ref 11.5–15.5)
WBC: 2.3 10*3/uL — AB (ref 4.0–10.5)

## 2017-09-02 LAB — I-STAT CG4 LACTIC ACID, ED
LACTIC ACID, VENOUS: 2.38 mmol/L — AB (ref 0.5–1.9)
Lactic Acid, Venous: 1.64 mmol/L (ref 0.5–1.9)

## 2017-09-02 LAB — CREATININE, SERUM: CREATININE: 0.55 mg/dL — AB (ref 0.61–1.24)

## 2017-09-02 LAB — I-STAT TROPONIN, ED: Troponin i, poc: 0 ng/mL (ref 0.00–0.08)

## 2017-09-02 LAB — LACTIC ACID, PLASMA
LACTIC ACID, VENOUS: 2.6 mmol/L — AB (ref 0.5–1.9)
Lactic Acid, Venous: 1.5 mmol/L (ref 0.5–1.9)

## 2017-09-02 LAB — PROTIME-INR
INR: 0.99
Prothrombin Time: 13 seconds (ref 11.4–15.2)

## 2017-09-02 MED ORDER — PREGABALIN 75 MG PO CAPS
75.0000 mg | ORAL_CAPSULE | Freq: Two times a day (BID) | ORAL | Status: DC
  Administered 2017-09-02 – 2017-09-04 (×4): 75 mg via ORAL
  Filled 2017-09-02 (×4): qty 1

## 2017-09-02 MED ORDER — BENZONATATE 100 MG PO CAPS
100.0000 mg | ORAL_CAPSULE | Freq: Two times a day (BID) | ORAL | Status: DC | PRN
  Administered 2017-09-02: 100 mg via ORAL
  Filled 2017-09-02 (×2): qty 1

## 2017-09-02 MED ORDER — PANTOPRAZOLE SODIUM 40 MG PO TBEC
40.0000 mg | DELAYED_RELEASE_TABLET | Freq: Two times a day (BID) | ORAL | Status: DC
  Administered 2017-09-02 – 2017-09-04 (×4): 40 mg via ORAL
  Filled 2017-09-02 (×4): qty 1

## 2017-09-02 MED ORDER — INSULIN GLARGINE 100 UNITS/ML SOLOSTAR PEN: 10.0000 [IU] | PEN_INJECTOR | Freq: Every day | SUBCUTANEOUS | Status: DC

## 2017-09-02 MED ORDER — LOTEPREDNOL ETABONATE 0.5 % OP GEL: 1.0000 [drp] | Freq: Every morning | OPHTHALMIC | Status: DC

## 2017-09-02 MED ORDER — IPRATROPIUM-ALBUTEROL 0.5-2.5 (3) MG/3ML IN SOLN
3.0000 mL | RESPIRATORY_TRACT | Status: DC
  Administered 2017-09-02 – 2017-09-03 (×3): 3 mL via RESPIRATORY_TRACT
  Filled 2017-09-02 (×3): qty 3

## 2017-09-02 MED ORDER — VANCOMYCIN HCL IN DEXTROSE 1-5 GM/200ML-% IV SOLN: 1000.0000 mg | Freq: Once | INTRAVENOUS | Status: DC

## 2017-09-02 MED ORDER — ACETAMINOPHEN 500 MG PO TABS: 500.0000 mg | ORAL_TABLET | Freq: Four times a day (QID) | ORAL | Status: DC | PRN

## 2017-09-02 MED ORDER — UMECLIDINIUM-VILANTEROL 62.5-25 MCG/INH IN AEPB
1.0000 | INHALATION_SPRAY | Freq: Every day | RESPIRATORY_TRACT | Status: DC
  Administered 2017-09-03 – 2017-09-04 (×2): 1 via RESPIRATORY_TRACT
  Filled 2017-09-02: qty 14

## 2017-09-02 MED ORDER — DM-GUAIFENESIN ER 30-600 MG PO TB12
1.0000 | ORAL_TABLET | Freq: Two times a day (BID) | ORAL | Status: DC
  Administered 2017-09-02 – 2017-09-04 (×4): 1 via ORAL
  Filled 2017-09-02 (×4): qty 1

## 2017-09-02 MED ORDER — VANCOMYCIN HCL 10 G IV SOLR
2000.0000 mg | Freq: Once | INTRAVENOUS | Status: AC
  Administered 2017-09-02: 2000 mg via INTRAVENOUS
  Filled 2017-09-02: qty 2000

## 2017-09-02 MED ORDER — VITAMIN C 500 MG PO TABS
500.0000 mg | ORAL_TABLET | Freq: Every day | ORAL | Status: DC
  Administered 2017-09-02 – 2017-09-04 (×3): 500 mg via ORAL
  Filled 2017-09-02 (×3): qty 1

## 2017-09-02 MED ORDER — ASPIRIN EC 81 MG PO TBEC
81.0000 mg | DELAYED_RELEASE_TABLET | Freq: Every day | ORAL | Status: DC
  Administered 2017-09-02 – 2017-09-04 (×3): 81 mg via ORAL
  Filled 2017-09-02 (×3): qty 1

## 2017-09-02 MED ORDER — FLUTICASONE-UMECLIDIN-VILANT 100-62.5-25 MCG/INH IN AEPB: 1.0000 | INHALATION_SPRAY | Freq: Every day | RESPIRATORY_TRACT | Status: DC

## 2017-09-02 MED ORDER — FLUTICASONE PROPIONATE HFA 110 MCG/ACT IN AERO
1.0000 | INHALATION_SPRAY | Freq: Two times a day (BID) | RESPIRATORY_TRACT | Status: DC
  Administered 2017-09-03 – 2017-09-04 (×2): 1 via RESPIRATORY_TRACT
  Filled 2017-09-02: qty 12

## 2017-09-02 MED ORDER — SODIUM CHLORIDE 0.9 % IV BOLUS (SEPSIS)
1000.0000 mL | Freq: Once | INTRAVENOUS | Status: AC
  Administered 2017-09-02: 1000 mL via INTRAVENOUS

## 2017-09-02 MED ORDER — FOLIC ACID 1 MG PO TABS
1.0000 mg | ORAL_TABLET | Freq: Every day | ORAL | Status: DC
  Administered 2017-09-03 – 2017-09-04 (×2): 1 mg via ORAL
  Filled 2017-09-02 (×2): qty 1

## 2017-09-02 MED ORDER — POTASSIUM CHLORIDE CRYS ER 20 MEQ PO TBCR
40.0000 meq | EXTENDED_RELEASE_TABLET | Freq: Every day | ORAL | Status: DC
  Administered 2017-09-03 – 2017-09-04 (×2): 40 meq via ORAL
  Filled 2017-09-02: qty 4
  Filled 2017-09-02 (×2): qty 2

## 2017-09-02 MED ORDER — HYDROCORTISONE NA SUCCINATE PF 100 MG IJ SOLR
50.0000 mg | Freq: Three times a day (TID) | INTRAMUSCULAR | Status: DC
  Administered 2017-09-02 – 2017-09-03 (×3): 50 mg via INTRAVENOUS
  Filled 2017-09-02 (×3): qty 2

## 2017-09-02 MED ORDER — CARBONYL IRON 25 MG PO TABS: ORAL_TABLET | ORAL | Status: DC

## 2017-09-02 MED ORDER — AZELASTINE HCL 0.1 % NA SOLN: 2.0000 | Freq: Two times a day (BID) | NASAL | Status: DC

## 2017-09-02 MED ORDER — ALBUTEROL SULFATE HFA 108 (90 BASE) MCG/ACT IN AERS: 2.0000 | INHALATION_SPRAY | RESPIRATORY_TRACT | Status: DC | PRN

## 2017-09-02 MED ORDER — PIPERACILLIN-TAZOBACTAM 3.375 G IVPB
3.3750 g | Freq: Three times a day (TID) | INTRAVENOUS | Status: DC
  Administered 2017-09-03: 3.375 g via INTRAVENOUS
  Filled 2017-09-02 (×2): qty 50

## 2017-09-02 MED ORDER — FERROUS SULFATE 325 (65 FE) MG PO TABS
325.0000 mg | ORAL_TABLET | Freq: Every day | ORAL | Status: DC
  Administered 2017-09-03 – 2017-09-04 (×2): 325 mg via ORAL
  Filled 2017-09-02 (×3): qty 1

## 2017-09-02 MED ORDER — FOLIC ACID 800 MCG PO TABS: 800.0000 ug | ORAL_TABLET | Freq: Every day | ORAL | Status: DC

## 2017-09-02 MED ORDER — SUMATRIPTAN SUCCINATE 50 MG PO TABS
50.0000 mg | ORAL_TABLET | Freq: Every day | ORAL | Status: DC | PRN
  Filled 2017-09-02: qty 1

## 2017-09-02 MED ORDER — LOTEPREDNOL ETABONATE 0.5 % OP SUSP
1.0000 [drp] | Freq: Every day | OPHTHALMIC | Status: DC
  Administered 2017-09-02 – 2017-09-04 (×3): 1 [drp] via OPHTHALMIC
  Filled 2017-09-02: qty 5

## 2017-09-02 MED ORDER — VANCOMYCIN HCL IN DEXTROSE 1-5 GM/200ML-% IV SOLN
1000.0000 mg | Freq: Three times a day (TID) | INTRAVENOUS | Status: DC
Start: 2017-09-02 — End: 2017-09-03
  Administered 2017-09-02 – 2017-09-03 (×2): 1000 mg via INTRAVENOUS
  Filled 2017-09-02 (×3): qty 200

## 2017-09-02 MED ORDER — INSULIN GLARGINE 100 UNIT/ML ~~LOC~~ SOLN
10.0000 [IU] | Freq: Every day | SUBCUTANEOUS | Status: DC
  Administered 2017-09-03 (×2): 10 [IU] via SUBCUTANEOUS
  Filled 2017-09-02 (×4): qty 0.1

## 2017-09-02 MED ORDER — OXYCODONE-ACETAMINOPHEN 7.5-325 MG PO TABS
1.0000 | ORAL_TABLET | Freq: Three times a day (TID) | ORAL | Status: DC
  Administered 2017-09-02 – 2017-09-04 (×5): 1 via ORAL
  Filled 2017-09-02 (×5): qty 1

## 2017-09-02 MED ORDER — ZOLPIDEM TARTRATE 5 MG PO TABS
10.0000 mg | ORAL_TABLET | Freq: Every day | ORAL | Status: DC
  Administered 2017-09-02: 10 mg via ORAL
  Filled 2017-09-02: qty 2

## 2017-09-02 MED ORDER — PIPERACILLIN-TAZOBACTAM 3.375 G IVPB 30 MIN
3.3750 g | Freq: Once | INTRAVENOUS | Status: AC
  Administered 2017-09-02: 3.375 g via INTRAVENOUS
  Filled 2017-09-02: qty 50

## 2017-09-02 MED ORDER — SODIUM CHLORIDE 0.9 % IV BOLUS (SEPSIS)
500.0000 mL | Freq: Once | INTRAVENOUS | Status: AC
  Administered 2017-09-02: 500 mL via INTRAVENOUS

## 2017-09-02 MED ORDER — FLUTICASONE PROPIONATE 50 MCG/ACT NA SUSP: 2.0000 | Freq: Two times a day (BID) | NASAL | Status: DC

## 2017-09-02 MED ORDER — ALBUTEROL SULFATE (2.5 MG/3ML) 0.083% IN NEBU: 2.5000 mg | INHALATION_SOLUTION | RESPIRATORY_TRACT | Status: DC | PRN

## 2017-09-02 MED ORDER — ENOXAPARIN SODIUM 40 MG/0.4ML ~~LOC~~ SOLN
40.0000 mg | SUBCUTANEOUS | Status: DC
  Administered 2017-09-02 – 2017-09-03 (×2): 40 mg via SUBCUTANEOUS
  Filled 2017-09-02 (×2): qty 0.4

## 2017-09-02 NOTE — Progress Notes (Signed)
Pharmacy Antibiotic Note  Joseph [name] is a 67 y.o. male admitted on 09/02/2017 with sepsis.  Pharmacy has been consulted for vancomycin and zosyn dosing. Pt is afebrile and WBC is low at 2.3. Lactic acid is elevated at 2.38.   Plan: Vancomycin 2gm IV x 1 then 1gm IV Q8H Zosyn 3.375gm IV Q8H (4 hr inf) F/u renal fxn, C&S, clinical status and trough at SS  Height: 6' (182.9 cm) Weight: 205 lb (93 kg) IBW/kg (Calculated) : 77.6  Temp (24hrs), Avg:99.4 F (37.4 C), Min:99.4 F (37.4 C), Max:99.4 F (37.4 C)  Recent Labs  Lab 08/29/17 1214 09/02/17 1118 09/02/17 1212  WBC 3.5* 2.3*  --   CREATININE 0.64 0.61  --   LATICACIDVEN  --   --  2.38*    Estimated Creatinine Clearance: 101 mL/min (by C-G formula based on SCr of 0.61 mg/dL).    No Known Allergies  Antimicrobials this admission: Vanc 1/8>> Zosyn 1/8>>  Dose adjustments this admission: N/A  Microbiology results: Pending  Thank you for allowing pharmacy to be a part of this patient's care.  Marolyn Urschel, Drake Leach 09/02/2017 12:59 PM

## 2017-09-02 NOTE — Telephone Encounter (Signed)
Pt is aware of below message and voiced his understanding; Pt states he wishes to order cpap machine online. Pt is unsure if Rx is needed, but will call us if anything further is needed on our end.  Pt also wanted to make [facility] aware that is awaiting EMS to take [facility]. Pt states he spiked a fever of 101.1 and had a fall from increased weakness. Pt states she spoke with home health nurse, who recommended ER as he could be in  Sepsis.   Will route to VS to make aware.

## 2017-09-02 NOTE — ED Notes (Signed)
Attempted report x1. 

## 2017-09-02 NOTE — ED Triage Notes (Signed)
Pt states last night while walking in bedroom he all of a sudden became weak in both knee and fell to the ground. Pt denies any LOC, had a neighbor come over to help him get up. Pt was discharged on 12/28 after having septic shock and was in ICU. Pt has ain in both knee where he fell.

## 2017-09-02 NOTE — ED Notes (Signed)
When undressing pt, a small amount of dry blood was found in pt's underwear. Cleaned pt up with no blood on washcloth and no blood on rectal thermometer probe. RN Albin Felling and Dr. Rosalia Hammers notified.

## 2017-09-02 NOTE — Telephone Encounter (Signed)
Noted  

## 2017-09-02 NOTE — ED Provider Notes (Signed)
[facility] EMERGENCY DEPARTMENT Provider Note   CSN: [id number] Arrival date & time: 09/02/17  1010     History   Chief Complaint Chief Complaint  Patient presents with  . Fall    HPI Joseph [name] is a 67 y.o. male.  HPI 67 year old male history of COPD, coronary artery disease, rheumatoid arthritis on chronic prednisone therapy history of sepsis in December presents today with a temp to 101 last night preceded by generalized weakness.  He states that he was walking across the kitchen floor at 9:00 when he became very weak and fell to his knees.  He needed assistance to get up.  He has been ambulatory since discharge but has used a walker.  2 hours after that his temp spiked to 101.  He denies any new associated symptoms.  He denies headache, sneezing, change in cough or sputum production, abdominal pain, nausea, vomiting, diarrhea, urinary tract infection symptoms or frequency of urination, or skin lesions.  He states that after his last episode of sepsis there is never any definite source found.  He states that he has had both flu shot and pneumonia shot this year.  Primary care doctor is Dr. Drue Novel Past Medical History:  Diagnosis Date  . CAD (coronary artery disease)    MI 2007, CABG  . Cataracts, bilateral   . Complication of anesthesia    DIFFICULTY AFTER CABG HAD CONFUSION POST OP  . COPD (chronic obstructive pulmonary disease) (HCC)   . Diabetes mellitus    Dx 07-2008 A1C 6.2  . Esophageal dysmotility   . Fatty liver 10/11/09  . Gastroesophageal reflux disease   . Gastroparesis   . Hiatal hernia   . Hypertension   . Neuropathy    (related to RA per neuro note 05-2011)  . Onychomycosis   . Osteoporosis    per DEXA 05-2008  . Oxygen dependent    3l  . Peripheral polyneuropathy    Severe, causing problems with his feet, see surgeries  . Psoriasis   . Rheumatoid arthritis [facility])    [facility] Rheumatology    Patient Active Problem List   Diagnosis  Date Noted  . Septic shock (HCC) 08/14/2017  . PVC (premature ventricular contraction) 07/29/2017  . Atrial flutter (HCC) 10/02/2016  . Hemoptysis 02/23/2016  . Chronic diastolic heart failure (HCC)   . Troponin level elevated   . Chest pain 01/19/2016  . PCP NOTES >>>>>>>>>>>>>>>>>>>>>>>>>>>>>>>>>>>> 05/16/2015  . Chronic obstructive pulmonary emphysema (HCC) 01/13/2015  . Hepatic steatosis 04/17/2014  . Bronchiectasis (HCC) 05/05/2013  . Recurrent pneumonia 11/30/2012  . Pulmonary nodules 11/30/2012  . Gastroparesis 10/20/2012  . Barrett's esophagus 10/19/2012  . Chronic respiratory failure with hypoxia (HCC) 10/17/2012  . Annual physical exam 05/08/2012  . Sicca syndrome (HCC) 10/11/2011  . CAD (coronary artery disease)   . Carotid stenosis 12/10/2010  . Rheumatoid arthritis with rheumatoid factor (HCC) 11/02/2010  . HOARSENESS 10/22/2010  . DYSLIPIDEMIA 09/12/2009  . Type 2 diabetes, controlled, with neuropathy (HCC) 08/01/2008  . Pain in Soft Tissues of Limb 07/13/2008  . Osteoporosis 07/13/2008  . Coronary atherosclerosis of unspecified type of vessel, native or graft 11/25/2006  . 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 Essential hypertension 09/16/2006  . GERD, Barrett's (09-2012 ), gastroparesis 06/06/2006  . PSORIASIS 06/06/2006    Past Surgical History:  Procedure Laterality Date  . A-FLUTTER ABLATION N/A 11/27/2016   Procedure: A-Flutter Ablation;  Surgeon: Marinus Maw, MD;  Location: [facility] INVASIVE CV LAB;  Service: Cardiovascular;  Laterality: N/A;  . achiles tendon surgery  8/09  . CARDIAC CATHETERIZATION N/A 01/23/2016   Procedure: Left Heart Cath and Cors/Grafts Angiography;  Surgeon: Lyn Records, MD;  Location: [facility] INVASIVE CV LAB;  Service: Cardiovascular;  Laterality: N/A;  . CARDIOVERSION N/A 10/23/2016   Procedure: CARDIOVERSION;  Surgeon: Rollene Rotunda, MD;  Location: [facility] ENDOSCOPY;  Service: Cardiovascular;  Laterality: N/A;  . CATARACT  EXTRACTION Right 03/2017  . CATARACT EXTRACTION Left 05/2017  . CHOLECYSTECTOMY    . CORONARY ARTERY BYPASS GRAFT  2007   (LIMA to the LAD, left radial to obtuse marginal, SVG to first diagonal, SVG to PDA. His ejection fraction to 50-55%  . ESOPHAGOGASTRODUODENOSCOPY N/A 10/19/2012   Procedure: ESOPHAGOGASTRODUODENOSCOPY (EGD);  Surgeon: Hart Carwin, MD;  Location: [facility] ENDOSCOPY;  Service: Endoscopy;  Laterality: N/A;  the dilatation is possible.   Marland Kitchen FOOT SURGERY     to tendon release and repair of osteomyelitis   . LARYNGOSCOPY N/A 01/21/2014   Procedure: LARYNGOSCOPY;  Surgeon: Christia Reading, MD;  Location: [facility] OR;  Service: ENT;  Laterality: N/A;  micro direct laryngoscopy with prolaryn injection/jet venturi ventilation  . TOE AMPUTATION  4-12/ 3 14   d/t a deformity, found to have osteomyelitis, complicated by post-op foot strtess FX       Home Medications    Prior to Admission medications   Medication Sig Start Date End Date Taking? Authorizing Provider  albuterol (PROAIR HFA) 108 (90 Base) MCG/ACT inhaler Inhale 2 puffs into the lungs every 4 (four) hours as needed for wheezing or shortness of breath. 02/04/17   [name], MD  amitriptyline (ELAVIL) 10 MG tablet Take 10 mg by mouth at bedtime.    [provider]  Ascorbic Acid (VITAMIN C) 500 MG tablet Take 500 mg by mouth daily.      [provider]  aspirin EC 81 MG tablet Take 81 mg by mouth daily.    [provider]  atorvastatin (LIPITOR) 80 MG tablet TAKE 1 TABLET(80 MG) BY MOUTH DAILY 10/21/16   Rollene Rotunda, MD  azelastine (ASTELIN) 0.1 % nasal spray Place 2 sprays into both nostrils 2 (two) times daily. Use in each nostril as directed 08/29/17   Wanda Plump, MD  B Complex-C (SUPER B COMPLEX PO) Take 1 tablet by mouth daily.     [provider]  B-D TB SYRINGE 1CC/27GX1/2" 27G X 1/2" 1 ML MISC Reported on 02/16/2016 02/18/15   [provider]  benzonatate (TESSALON) 100 MG capsule  Take 1 capsule (100 mg total) by mouth 2 (two) times daily as needed for cough. Patient not taking: Reported on 08/29/2017 08/22/17   Drema Dallas, MD  Carbonyl Iron (PERFECT IRON PO) Take 1 tablet by mouth every morning.     [provider]  clobetasol cream (TEMOVATE) 0.05 % Apply 1 application topically 2 (two) times daily as needed (rash).  10/19/15   Wanda Plump, MD  dextromethorphan-guaiFENesin [facility] DM) 30-600 MG 12hr tablet Take 1 tablet by mouth 2 (two) times daily. 08/22/17   Drema Dallas, MD  fluticasone (FLONASE) 50 MCG/ACT nasal spray Place 2 sprays into both nostrils 2 (two) times daily. 08/22/17   Drema Dallas, MD  Fluticasone-Umeclidin-Vilant (TRELEGY ELLIPTA) 100-62.5-25 MCG/INH AEPB Inhale 1 puff daily into the lungs. 07/08/17   [name], MD  folic acid (FOLVITE) 800 MCG tablet Take 800 mcg by mouth daily.    [provider]  furosemide (LASIX)  20 MG tablet Take 2 tablets (40 mg total) by mouth daily. 08/22/17   Drema Dallas, MD  hydrocortisone cream 1 % Apply 1 application topically 3 (three) times daily as needed for itching (skin irritation). Patient not taking: Reported on 08/29/2017 10/23/16   Rollene Rotunda, MD  insulin glargine (LANTUS) 100 unit/mL SOPN Inject 0.1 mLs (10 Units total) into the skin at bedtime. 08/22/17   Drema Dallas, MD  Insulin Pen Needle (PEN NEEDLES) 29G X MISC Use as directed 08/22/17   Drema Dallas, MD  ipratropium-albuterol (DUONEB) 0.5-2.5 (3) MG/3ML SOLN Take 3 mLs by nebulization every 4 (four) hours as needed. Patient not taking: Reported on 08/29/2017 08/22/17   Drema Dallas, MD  Lancets [facility] ULTRASOFT) lancets USE TO TEST BLOOD SUGAR NO MORE THAN TWICE DAILY 07/01/16   Wanda Plump, MD  Loteprednol Etabonate (LOTEMAX) 0.5 % GEL Place 1 drop into both eyes every morning. daily 07/01/14   [provider]  nitroGLYCERIN (NITROSTAT) 0.4 MG SL tablet Place 1 tablet (0.4 mg total) under the tongue  every 5 (five) minutes as needed for chest pain (up to 3 doses for severe chest pain. If no relief after 3rd dose, proceed to the ED for an evaluation). Patient not taking: Reported on 08/29/2017 10/04/16   Rollene Rotunda, MD  NON FORMULARY [place]ER[place] PORTABLE PULSE DOSE OXYGEN oxygen 3L per nasal cannula 24 hours a day Dx: 496    [provider]  ONE TOUCH ULTRA TEST test strip USE TO TEST BLOOD SUGAR NO MORE THAN TWICE DAILY 07/01/16   Wanda Plump, MD  oxyCODONE-acetaminophen (PERCOCET) 7.5-325 MG per tablet Take 1 tablet by mouth 3 (three) times daily. 3 times a day    [provider]  pantoprazole (PROTONIX) 40 MG tablet Take 1 tablet (40 mg total) by mouth 2 (two) times daily. 05/16/17   Armbruster, Willaim Rayas, MD  potassium chloride (K-DUR) 10 MEQ tablet Take 4 tablets (40 mEq total) by mouth daily. 08/22/17   Drema Dallas, MD  predniSONE (DELTASONE) 10 MG tablet Take 2 tablets (20 mg total) by mouth daily with breakfast for 21 days. 08/31/17 09/21/17  Drema Dallas, MD  pregabalin (LYRICA) 75 MG capsule Take 75 mg by mouth 2 (two) times daily.    [provider]  SUMAtriptan (IMITREX) 50 MG tablet Take 1 tablet (50 mg total) by mouth daily as needed for migraine. May repeat in 2 hours if headache persists or recurs. 08/22/17 08/23/18  Drema Dallas, MD  zolpidem (AMBIEN) 10 MG tablet Take 1 tablet (10 mg total) by mouth at bedtime. 08/22/17   Drema Dallas, MD    Family History Family History  Problem Relation Age of Onset  . Heart disease Mother   . Diverticulitis Mother   . Heart disease Father   . Heart attack Father        x2  . Colon cancer Neg Hx   . Prostate cancer Neg Hx     Social History Social History   Tobacco Use  . Smoking status: Former Smoker    Packs/day: 1.00    Years: 40.00    Pack years: 40.00    Last attempt to quit: 08/26/2005    Years since quitting: 12.0  . Smokeless tobacco: Former Neurosurgeon    Quit date: 04/02/2006  . Tobacco  comment: smoked x 40 years, 1 ppd  Substance Use Topics  . Alcohol use: No  . Drug use:  No     Allergies   Patient has no known allergies.   Review of Systems Review of Systems  Constitutional: Positive for activity change and fever. Negative for chills.  HENT: Negative.   Eyes: Negative.   Respiratory: Positive for cough.   Cardiovascular: Negative.   Gastrointestinal: Negative.   Endocrine: Negative.   Genitourinary: Negative.   Musculoskeletal: Negative.   Skin: Negative.   Allergic/Immunologic: Positive for immunocompromised state.  Neurological: Positive for weakness.  Hematological: Negative.   Psychiatric/Behavioral: Negative.   All other systems reviewed and are negative.    Physical Exam Updated Vital Signs BP (!) 150/87 (BP Location: Left Arm)   Pulse 96   Temp 99.4 F (37.4 C) (Oral)   Resp 18   Ht 1.829 m (6')   Wt 93 kg (205 lb)   SpO2 99%   BMI 27.80 kg/m   Physical Exam  Constitutional: He is oriented to person, place, and time. He appears well-developed. No distress.  HENT:  Head: Normocephalic and atraumatic.  Right Ear: External ear normal.  Left Ear: External ear normal.  Mouth/Throat: Oropharynx is clear and moist.  Eyes: EOM are normal. Pupils are equal, round, and reactive to light.  Neck: Normal range of motion. Neck supple.  Cardiovascular: Normal rate, regular rhythm and normal heart sounds.  Pulmonary/Chest: Effort normal and breath sounds normal.  Abdominal: Soft. Bowel sounds are normal.  Musculoskeletal: Normal range of motion.  Neurological: He is alert and oriented to person, place, and time.  Skin: Skin is warm and dry. Capillary refill takes less than 2 seconds. There is erythema.  Bilateral lower extremity skin changes consistent with chronic skin changes which patient endorses no change.  Psychiatric: He has a normal mood and affect.  Nursing note and vitals reviewed.    ED Treatments / Results  Labs (all labs  ordered are listed, but only abnormal results are displayed) Labs Reviewed  COMPREHENSIVE METABOLIC PANEL - Abnormal; Notable for the following components:      Result Value   Sodium 130 (*)    Chloride 97 (*)    Glucose, Bld 190 (*)    Albumin 3.0 (*)    All other components within normal limits  CBC WITH DIFFERENTIAL/PLATELET - Abnormal; Notable for the following components:   WBC 2.3 (*)    RDW 16.0 (*)    Platelets 143 (*)    Neutro Abs 1.1 (*)    All other components within normal limits  URINALYSIS, ROUTINE W REFLEX MICROSCOPIC - Abnormal; Notable for the following components:   APPearance HAZY (*)    All other components within normal limits  I-STAT CG4 LACTIC ACID, ED - Abnormal; Notable for the following components:   Lactic Acid, Venous 2.38 (*)    All other components within normal limits  CULTURE, BLOOD (ROUTINE X 2)  CULTURE, BLOOD (ROUTINE X 2)  PROTIME-INR  I-STAT CG4 LACTIC ACID, ED  I-STAT TROPONIN, ED  I-STAT CG4 LACTIC ACID, ED    EKG  EKG Interpretation  Date/Time:  Tuesday September 02 2017 13:04:03 EST Ventricular Rate:  89 PR Interval:    QRS Duration: 95 QT Interval:  381 QTC Calculation: 464 R Axis:   30 Text Interpretation:  Sinus rhythm Probable left atrial enlargement Anteroseptal infarct, old Baseline wander in lead(s) V4 V5 Confirmed by Margarita Grizzle [phone]) on 09/02/2017 2:21:17 PM       Radiology Dg Chest 2 View  Result Date: 09/02/2017 CLINICAL DATA:  Fall.  Cough and  congestion. EXAM: CHEST  2 VIEW COMPARISON:  08/17/2017.  10/02/2016. FINDINGS: Prior CABG. Heart size normal. Chronic bilateral interstitial prominence again noted consistent chronic interstitial lung disease. Bullous COPD. No focal alveolar infiltrate. No pleural effusion or pneumothorax. Degenerative change thoracic spine. IMPRESSION: 1. Chronic bilateral interstitial prominence again noted consistent with chronic interstitial lung disease. Bullous COPD. 2.  No acute  pulmonary infiltrate. Electronically Signed   By: Maisie Fus  Register   On: 09/02/2017 12:55   Dg Knee Complete 4 Views Left  Result Date: 09/02/2017 CLINICAL DATA:  The patient fell last night and is complaining of anterior right knee pain and bruising. EXAM: LEFT KNEE - COMPLETE 4+ VIEW; RIGHT KNEE - COMPLETE 4+ VIEW COMPARISON:  None in PACs FINDINGS: Right knee: The bones are subjectively adequately mineralized. There is moderate narrowing of all 3 joint compartments. There irregularity of the articular surfaces of the femoral condyles. There is spurring of the tibial spines and articular margins of the patella. There is no acute fracture or dislocation. There is no joint effusion. There popliteal artery calcification. Left knee: The bones are subjectively adequately mineralized. There mild-to-moderate narrowing of all 3 joint compartments. There beaking of the tibial spines and spurring of the articular margins of the patella. There is no acute fracture or dislocation. There is no joint effusion. 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 No acute fracture, dislocation, or joint effusion. Electronically Signed   By: David  [place] M.D.   On: 09/02/2017 12:54   Dg Knee Complete 4 Views Right  Result Date: 09/02/2017 CLINICAL DATA:  The patient fell last night and is complaining of anterior right knee pain and bruising. EXAM: LEFT KNEE - COMPLETE 4+ VIEW; RIGHT KNEE - COMPLETE 4+ VIEW COMPARISON:  None in PACs FINDINGS: Right knee: The bones are subjectively adequately mineralized. There is moderate narrowing of all 3 joint compartments. There irregularity of the articular surfaces of the femoral condyles. There is spurring of the tibial spines and articular margins of the patella. There is no acute fracture or dislocation. There is no joint effusion. There popliteal artery calcification. Left knee: The bones are subjectively adequately mineralized.  There mild-to-moderate narrowing of all 3 joint compartments. There beaking of the tibial spines and spurring of the articular margins of the patella. There is no acute fracture or dislocation. There is no joint effusion. 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 No acute fracture, dislocation, or joint effusion. Electronically Signed   By: David  [place] M.D.   On: 09/02/2017 12:54    Procedures Procedures (including critical care time)  Medications Ordered in ED Medications - No data to display   Initial Impression / Assessment and Plan / ED Course  I have reviewed the triage vital signs and the nursing notes.  Pertinent labs & imaging results that were available during my care of the patient were reviewed by me and considered in my medical decision making (see chart for details).    12:53 PM Patient with elevated lactic acid and presents reporting that he had a fever last night.  Plan sepsis protocol with broad-spectrum antibiotics and IV fluids. Temp 100.5 here HR 95 Lactic elevated at 2.38 Leukopenia Vitals:   09/02/17 1301 09/02/17 1400  BP:  (!) 141/80  Pulse:  90  Resp:  (!) 21  Temp: (!) 100.5 F (38.1 C)   SpO2:  33%    67 year old man on immunosuppressive therapy secondary to  rheumatoid arthritis with recent admission due to sepsis presents today with fever and elevated lactic acid.  Patient given IV fluids and broad-spectrum antibiotics.  Blood pressure and heart rate within normal limits.  No obvious source on physical exam, laboratory evaluation, or radiology studies. Weakness secondary to above resulted in fall and knee pain.  Bilateral knees x-rayed without evidence of acute fracture Plan admission to hospitalist service for further treatment. Discussed with Dr. Lynden Ang who will see for evaluation Final Clinical Impressions(s) / ED Diagnoses   Final diagnoses:  Sepsis, due to unspecified organism  [facility])    ED Discharge Orders    None       Margarita Grizzle, MD 09/02/17 1455

## 2017-09-02 NOTE — Progress Notes (Signed)
PCP:   Wanda Plump, MD   Chief Complaint:  Fever without source  60 m sero+ RH arthritis foll [facility] [Dr. 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  No abd pain  Wbc a little low 2.3-some TCP 150's-cxr neg for PNA-UC and BC x 2 collected. VSS in ED without hypotension Lactic acid slight elevated 2.3 Sepsis protocl started in ED vanc zosyn given  Retired from work with regards to fundraising Smoker till 2007 Never drinker    Review of Systems:  See above  Past Medical History: Past Medical History:  Diagnosis Date  . CAD (coronary artery disease)    MI 2007, CABG  . Cataracts, bilateral   . Complication of anesthesia    DIFFICULTY AFTER CABG HAD CONFUSION POST OP  . COPD (chronic obstructive pulmonary disease) (HCC)   . Diabetes mellitus    Dx 07-2008 A1C 6.2  . Esophageal dysmotility   . Fatty liver 10/11/09  . Gastroesophageal reflux disease   . Gastroparesis   . Hiatal hernia   . Hypertension   . Neuropathy    (related to RA per neuro note 05-2011)  . Onychomycosis   . Osteoporosis    per DEXA 05-2008  . Oxygen dependent    3l  . Peripheral polyneuropathy    Severe, causing problems with his feet, see surgeries  . Psoriasis   . Rheumatoid arthritis [facility])    [facility] Rheumatology    Past surgical history: Past Surgical History:  Procedure Laterality Date  . A-FLUTTER ABLATION N/A 11/27/2016   Procedure: A-Flutter Ablation;  Surgeon: Marinus Maw, MD;   Location: [facility] INVASIVE CV LAB;  Service: Cardiovascular;  Laterality: N/A;  . achiles tendon surgery  8/09  . CARDIAC CATHETERIZATION N/A 01/23/2016   Procedure: Left Heart Cath and Cors/Grafts Angiography;  Surgeon: Lyn Records, MD;  Location: [facility] INVASIVE CV LAB;  Service: Cardiovascular;  Laterality: N/A;  . CARDIOVERSION N/A 10/23/2016   Procedure: CARDIOVERSION;  Surgeon: Rollene Rotunda, MD;  Location: [facility] ENDOSCOPY;  Service: Cardiovascular;  Laterality: N/A;  . CATARACT EXTRACTION Right 03/2017  . CATARACT EXTRACTION Left 05/2017  . CHOLECYSTECTOMY    . CORONARY ARTERY BYPASS GRAFT  2007   (LIMA to the LAD, left radial to obtuse marginal, SVG to first diagonal, SVG to PDA. His ejection fraction to 50-55%  . ESOPHAGOGASTRODUODENOSCOPY N/A 10/19/2012   Procedure: ESOPHAGOGASTRODUODENOSCOPY (EGD);  Surgeon: Hart Carwin, MD;  Location: [facility] ENDOSCOPY;  Service: Endoscopy;  Laterality: N/A;  the dilatation is possible.   Marland Kitchen FOOT SURGERY     to tendon release and repair of osteomyelitis   . LARYNGOSCOPY N/A 01/21/2014   Procedure: LARYNGOSCOPY;  Surgeon: Christia Reading, MD;  Location: [facility] OR;  Service: ENT;  Laterality: N/A;  micro direct laryngoscopy with prolaryn injection/jet venturi ventilation  . TOE AMPUTATION  4-12/ 3 14   d/t a deformity, found to have osteomyelitis, complicated by post-op foot strtess FX    Medications: Prior to Admission medications   Medication Sig Start  Date End Date Taking? Authorizing Provider  acetaminophen (TYLENOL) 500 MG tablet Take 500 mg by mouth every 6 (six) hours as needed for mild pain or headache. Max of 2000 mg/day   Yes [provider]  aspirin EC 81 MG tablet Take 81 mg by mouth daily.   Yes [provider]  atorvastatin (LIPITOR) 80 MG tablet TAKE 1 TABLET(80 MG) BY MOUTH DAILY 10/21/16  Yes Hochrein, Fayrene Fearing, MD  B Complex-C (SUPER B COMPLEX PO) Take 1 tablet by mouth daily.    Yes [provider]  benzonatate (TESSALON) 100  MG capsule Take 1 capsule (100 mg total) by mouth 2 (two) times daily as needed for cough. 08/22/17  Yes Drema Dallas, MD  Carbonyl Iron (PERFECT IRON PO) Take 1 tablet by mouth every morning.    Yes [provider]  dextromethorphan-guaiFENesin (MUCINEX DM) 30-600 MG 12hr tablet Take 1 tablet by mouth 2 (two) times daily. 08/22/17  Yes Drema Dallas, MD  Fluticasone-Umeclidin-Vilant (TRELEGY ELLIPTA) 100-62.5-25 MCG/INH AEPB Inhale 1 puff daily into the lungs. 07/08/17  Yes [name], MD  folic acid (FOLVITE) 800 MCG tablet Take 800 mcg by mouth daily.   Yes [provider]  furosemide (LASIX) 20 MG tablet Take 2 tablets (40 mg total) by mouth daily. 08/22/17  Yes Drema Dallas, MD  insulin glargine (LANTUS) 100 unit/mL SOPN Inject 0.1 mLs (10 Units total) into the skin at bedtime. 08/22/17  Yes Drema Dallas, MD  Loteprednol Etabonate (LOTEMAX) 0.5 % GEL Place 1 drop into both eyes every morning. daily 07/01/14  Yes [provider]  Melatonin 1 MG CAPS Take 4 mg by mouth as needed (Sleep).   Yes [provider]  Multiple Vitamins-Minerals (VITEYES AREDS ADVANCED PO) Take 1 tablet by mouth daily.   Yes [provider]  oxyCODONE-acetaminophen (PERCOCET) 7.5-325 MG per tablet Take 1 tablet by mouth 3 (three) times daily. 3 times a day   Yes [provider]  OXYGEN Inhale 3 L into the lungs continuous.   Yes [provider]  pantoprazole (PROTONIX) 40 MG tablet Take 1 tablet (40 mg total) by mouth 2 (two) times daily. 05/16/17  Yes Armbruster, Willaim Rayas, MD  potassium chloride (K-DUR) 10 MEQ tablet Take 4 tablets (40 mEq total) by mouth daily. 08/22/17  Yes Drema Dallas, MD  predniSONE (DELTASONE) 10 MG tablet Take 2 tablets (20 mg total) by mouth daily with breakfast for 21 days. Patient taking differently: Take 30 mg by mouth daily with breakfast.  08/31/17 09/21/17 Yes Drema Dallas, MD  pregabalin (LYRICA) 75 MG capsule Take  75 mg by mouth 2 (two) times daily.   Yes [provider]  sodium chloride (OCEAN) 0.65 % SOLN nasal spray Place 1 spray into both nostrils as needed for congestion.   Yes [provider]  albuterol (PROAIR HFA) 108 (90 Base) MCG/ACT inhaler Inhale 2 puffs into the lungs every 4 (four) hours as needed for wheezing or shortness of breath. 02/04/17   [name], MD  Ascorbic Acid (VITAMIN C) 500 MG tablet Take 500 mg by mouth daily.      [provider]  azelastine (ASTELIN) 0.1 % nasal spray Place 2 sprays into both nostrils 2 (two) times daily. Use in each nostril as directed Patient not taking: Reported on 09/02/2017 08/29/17   Wanda Plump, MD  fluticasone [facility]) 50 MCG/ACT nasal spray Place 2 sprays into both nostrils 2 (two) times daily. Patient not  taking: Reported on 09/02/2017 08/22/17   Drema Dallas, MD  hydrocortisone cream 1 % Apply 1 application topically 3 (three) times daily as needed for itching (skin irritation). Patient not taking: Reported on 08/29/2017 10/23/16   Rollene Rotunda, MD  ipratropium-albuterol (DUONEB) 0.5-2.5 (3) MG/3ML SOLN Take 3 mLs by nebulization every 4 (four) hours as needed. Patient not taking: Reported on 08/29/2017 08/22/17   Drema Dallas, MD  nitroGLYCERIN (NITROSTAT) 0.4 MG SL tablet Place 1 tablet (0.4 mg total) under the tongue every 5 (five) minutes as needed for chest pain (up to 3 doses for severe chest pain. If no relief after 3rd dose, proceed to the ED for an evaluation). 10/04/16   Rollene Rotunda, MD  SUMAtriptan (IMITREX) 50 MG tablet Take 1 tablet (50 mg total) by mouth daily as needed for migraine. May repeat in 2 hours if headache persists or recurs. 08/22/17 08/23/18  Drema Dallas, MD  zolpidem (AMBIEN) 10 MG tablet Take 1 tablet (10 mg total) by mouth at bedtime. Patient not taking: Reported on 09/02/2017 08/22/17   Drema Dallas, MD    Allergies:  No Known Allergies  Social History:  reports that he quit  smoking about 12 years ago. He has a 40.00 pack-year smoking history. He quit smokeless tobacco use about 11 years ago. He reports that he does not drink alcohol or use drugs.  Family History: Family History  Problem Relation Age of Onset  . Heart disease Mother   . Diverticulitis Mother   . Heart disease Father   . Heart attack Father        x2  . Colon cancer Neg Hx   . Prostate cancer Neg Hx     Physical Exam: Vitals:   09/02/17 1200 09/02/17 1215 09/02/17 1301 09/02/17 1400  BP: 134/79 124/75  (!) 141/80  Pulse: 88 87  90  Resp: 16 19  (!) 21  Temp:   (!) 100.5 F (38.1 C)   TempSrc:   Oral   SpO2: 97% 94%  100%  Weight:      Height:       eomi ncat no pallor no ict No jvd  Chest has some mild rales no crackles nor wheeze abd soft deformities to both hands and feet c/w RH eomi s1 s2 no m/r/g   Labs on Admission:  Recent Labs    09/02/17 1118  NA 130*  K 3.7  CL 97*  CO2 24  GLUCOSE 190*  BUN 10  CREATININE 0.61  CALCIUM 8.9   Recent Labs    09/02/17 1118  AST 32  ALT 25  ALKPHOS 64  BILITOT 1.2  PROT 7.5  ALBUMIN 3.0*   No results for input(s): LIPASE, AMYLASE in the last 72 hours. Recent Labs    09/02/17 1118  WBC 2.3*  NEUTROABS 1.1*  HGB 13.4  HCT 40.0  MCV 87.5  PLT 143*   No results for input(s): CKTOTAL, CKMB, CKMBINDEX, TROPONINI in the last 72 hours. No results for input(s): TSH, T4TOTAL, T3FREE, THYROIDAB in the last 72 hours.  Invalid input(s): FREET3 No results for input(s): VITAMINB12, FOLATE, FERRITIN, TIBC, IRON, RETICCTPCT in the last 72 hours.  Radiological Exams on Admission: No results found.  Assessment/Plan  Presumed sepsi-broad sepctrum abx vanc/zoysn-panculture-induce sputum and cult-keep IVF 75 cc/h rpt lactate and PCT--Has a h/o Chr aspiration so might need to involve SLP--follow uc and bc--rpt CXr am to ensure no aspiration ?Rh flare-would stress dose wih solu-cortef-ddx for  fever could be Rh as not on Mtx  anymore since last admit-would need to see Rhem soon if can arrange CAd s/p CABg 2007-resume asa-for some reaons not on bb nor ace-needs follow up Migraines-cont imitrex Neuropathy from RH-cont Pregaballin 75 bid Bllous COPd on 3 l oxygen and prior smoker cont inhaler Anemia 2/2 Rh-TCP unknown casue-could have been med effcts from MTX-improved compared to last admit-cont iron  DNR, INpatient tele, dw wife  Rhetta Mura 09/02/2017, 2:31 PM

## 2017-09-03 ENCOUNTER — Inpatient Hospital Stay (HOSPITAL_COMMUNITY): Payer: Medicare HMO

## 2017-09-03 LAB — RESPIRATORY PANEL BY PCR
Adenovirus: NOT DETECTED
BORDETELLA PERTUSSIS-RVPCR: NOT DETECTED
CORONAVIRUS 229E-RVPPCR: NOT DETECTED
CORONAVIRUS HKU1-RVPPCR: NOT DETECTED
Chlamydophila pneumoniae: NOT DETECTED
Coronavirus NL63: NOT DETECTED
Coronavirus OC43: DETECTED — AB
INFLUENZA B-RVPPCR: NOT DETECTED
Influenza A: NOT DETECTED
METAPNEUMOVIRUS-RVPPCR: NOT DETECTED
MYCOPLASMA PNEUMONIAE-RVPPCR: NOT DETECTED
PARAINFLUENZA VIRUS 2-RVPPCR: NOT DETECTED
Parainfluenza Virus 1: NOT DETECTED
Parainfluenza Virus 3: NOT DETECTED
Parainfluenza Virus 4: NOT DETECTED
Respiratory Syncytial Virus: NOT DETECTED
Rhinovirus / Enterovirus: NOT DETECTED

## 2017-09-03 LAB — CBC WITH DIFFERENTIAL/PLATELET
BASOS PCT: 0 %
Basophils Absolute: 0 10*3/uL (ref 0.0–0.1)
EOS ABS: 0 10*3/uL (ref 0.0–0.7)
EOS PCT: 0 %
HCT: 34.1 % — ABNORMAL LOW (ref 39.0–52.0)
HEMOGLOBIN: 11.4 g/dL — AB (ref 13.0–17.0)
Lymphocytes Relative: 38 %
Lymphs Abs: 0.9 10*3/uL (ref 0.7–4.0)
MCH: 28.9 pg (ref 26.0–34.0)
MCHC: 33.4 g/dL (ref 30.0–36.0)
MCV: 86.5 fL (ref 78.0–100.0)
MONO ABS: 0.4 10*3/uL (ref 0.1–1.0)
Monocytes Relative: 16 %
NEUTROS PCT: 46 %
Neutro Abs: 1.1 10*3/uL — ABNORMAL LOW (ref 1.7–7.7)
PLATELETS: 118 10*3/uL — AB (ref 150–400)
RBC: 3.94 MIL/uL — ABNORMAL LOW (ref 4.22–5.81)
RDW: 15.4 % (ref 11.5–15.5)
WBC: 2.2 10*3/uL — ABNORMAL LOW (ref 4.0–10.5)

## 2017-09-03 LAB — LACTIC ACID, PLASMA: Lactic Acid, Venous: 2 mmol/L (ref 0.5–1.9)

## 2017-09-03 LAB — GLUCOSE, CAPILLARY
GLUCOSE-CAPILLARY: 118 mg/dL — AB (ref 65–99)
GLUCOSE-CAPILLARY: 142 mg/dL — AB (ref 65–99)
GLUCOSE-CAPILLARY: 188 mg/dL — AB (ref 65–99)
GLUCOSE-CAPILLARY: 210 mg/dL — AB (ref 65–99)
Glucose-Capillary: 154 mg/dL — ABNORMAL HIGH (ref 65–99)

## 2017-09-03 LAB — PROCALCITONIN

## 2017-09-03 MED ORDER — PREDNISONE 10 MG PO TABS
30.0000 mg | ORAL_TABLET | Freq: Every day | ORAL | Status: DC
  Administered 2017-09-04: 30 mg via ORAL
  Filled 2017-09-03: qty 1

## 2017-09-03 MED ORDER — BENZONATATE 100 MG PO CAPS
100.0000 mg | ORAL_CAPSULE | Freq: Three times a day (TID) | ORAL | Status: DC | PRN
Start: 2017-09-03 — End: 2017-09-04
  Administered 2017-09-03: 100 mg via ORAL
  Filled 2017-09-03: qty 1

## 2017-09-03 MED ORDER — IPRATROPIUM-ALBUTEROL 0.5-2.5 (3) MG/3ML IN SOLN
3.0000 mL | Freq: Three times a day (TID) | RESPIRATORY_TRACT | Status: DC
  Administered 2017-09-03 – 2017-09-04 (×4): 3 mL via RESPIRATORY_TRACT
  Filled 2017-09-03 (×5): qty 3

## 2017-09-03 MED ORDER — ORAL CARE MOUTH RINSE
15.0000 mL | Freq: Two times a day (BID) | OROMUCOSAL | Status: DC
  Administered 2017-09-03: 15 mL via OROMUCOSAL

## 2017-09-03 MED ORDER — ATORVASTATIN CALCIUM 80 MG PO TABS
80.0000 mg | ORAL_TABLET | Freq: Every day | ORAL | Status: DC
  Administered 2017-09-03: 80 mg via ORAL
  Filled 2017-09-03: qty 1

## 2017-09-03 NOTE — Progress Notes (Signed)
CRITICAL VALUE ALERT  Critical Value:  Lactic acid 2.6   Date & Time Notied:  09/03/17 2230   Provider Notified: Craige Cotta  Orders Received/Actions taken: 500 ml bolus

## 2017-09-03 NOTE — Plan of Care (Signed)
Patient doing well, out of bed to chair, weakness noted to BLE.  Patient awaiting sputum culture, all antibiotic d/c.  Will continue to monitor closely.

## 2017-09-03 NOTE — Progress Notes (Signed)
PROGRESS NOTE    Joseph [name]  [facility]:[id number] DOB: 24-Sep-1950 DOA: 09/02/2017 PCP: Wanda Plump, MD  Brief Narrative:66 m sero+ RH arthritis foll [facility] [Dr. 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 -Completed course of vancomycin and cefepime inpatient and all cultures were negative then  -has been weak since hospital stay-was ambualting and felt weak in LE's suddenyl and fell-then noted tmax 101 oral last pm at home, has had a mild cough and congestion with stuffy nose for 1-2 weeks now.   Assessment & Plan:  Fever/SIRs -Patient reports worsening of his chronic cough with nasal congestion and stuffiness -Clinically suspect viral URI -WBC differential with lymphocytosis which is more suggestive of viral illness -Follow-up blood cultures, check pro-calcitonin, chest x-ray with chronic changes only and urinalysis unremarkable -Abdominal and skin exam is benign, no evidence of active synovitis -Stopped broad-spectrum antibiotics and monitor  Severe rheumatoid arthritis -Followed by rheumatologist at [facility] -On prednisone taper since previous hospitalization was down to 25 mg per day for 1 week -Stop stress dose steroids and restart previously planned prednisone taper   COPD/chronic respiratory failure -On 3 L home oxygen at baseline, no wheezing or acute dyspnea at this time -Continue nebs as needed  History of CAD status post CABG   continue aspirin, not on beta blocker likely due to severe COPD  Chronic anemia and mild leukopenia/thrombocytopenia -Likely due to methotrexate, this has been held  since last hospitalization/infection  Diabetes mellitus  -Continue Lantus, sliding scale insulin   DVT prophylaxis: Lovenox  Code Status:  DO NOT RESUSCITATE  Family Communication: wife at bedside Disposition Plan:  home pending clinical improvement    ConsultantsNonecedures:   Antimicrobials:  Antibiotics Given (last 72 hours)    Date/Time Action Medication Dose Rate   09/02/17 1317 New Bag/Given   piperacillin-tazobactam (ZOSYN) IVPB 3.375 g 3.375 g 100 mL/hr   09/02/17 1330 New Bag/Given   vancomycin (VANCOCIN) 2,000 mg in sodium chloride 0.9 % 500 mL IVPB 2,000 mg 250 mL/hr   09/02/17 2319 New Bag/Given   vancomycin (VANCOCIN) IVPB 1000 mg/200 mL premix 1,000 mg 200 mL/hr   09/03/17 0445 New Bag/Given   piperacillin-tazobactam (ZOSYN) IVPB 3.375 g 3.375 g 12.5 mL/hr   09/03/17 0517 New Bag/Given   vancomycin (VANCOCIN) IVPB 1000 mg/200 mL premix 1,000 mg 200 mL/hr      Subjective: -feels ok, low grade temp last pm  Objective: Vitals:   09/03/17 0448 09/03/17 0751 09/03/17 1322 09/03/17 1325  BP: 117/75  132/68   Pulse: (!) 110  83   Resp: 18  16   Temp: 98.4 F (36.9 C)  98.2 F (36.8 C)   TempSrc: Oral  Oral   SpO2: 96% 95% 99% 98%  Weight:      Height:        Intake/Output Summary (Last 24 hours) at 09/03/2017 1405 Last data filed at 09/03/2017 1300 Gross per 24 hour  Intake 3310 ml  Output 1265 ml  Net 2045 ml   Filed Weights   09/02/17 1047 09/02/17 2100  Weight: 93 kg (205 lb) 91.6 kg (201 lb 15.1 oz)  Examination:  General exam: Appears calm and comfortable, no distress Respiratory system: CTAB, poor air movement B/L Cardiovascular system: S1 & S2 heard, RRR Gastrointestinal system: Abdomen is nondistended, soft and nontender.Normal bowel sounds heard. Central nervous system: Alert and oriented. No focal neurological deficits. Extremities: no edema, severe RA deformities Skin: No rashes, lesions or ulcers Psychiatry: Judgement and insight  appear normal. Mood & affect appropriate.     Data Reviewed:   CBC: Recent Labs  Lab 08/29/17 1214 09/02/17 1118 09/02/17 1715 09/03/17 1038  WBC 3.5* 2.3* 2.4* 2.2*  NEUTROABS 0.9* 1.1*  --  1.1*  HGB 14.3 13.4 12.3* 11.4*  HCT 43.5 40.0 37.5* 34.1*  MCV 91.1 87.5 87.6 86.5  PLT 214.0 143* 147* 118*   Basic Metabolic Panel: Recent Labs  Lab 08/29/17 1214 09/02/17 1118 09/02/17 1715  NA 135 130*  --   K 3.6 3.7  --   CL 99 97*  --   CO2 27 24  --   GLUCOSE 98 190*  --   BUN 10 10  --   CREATININE 0.64 0.61 0.55*  CALCIUM 9.0 8.9  --    GFR: Estimated Creatinine Clearance: 101 mL/min (A) (by C-G formula based on SCr of 0.55 mg/dL (L)). Liver Function Tests: Recent Labs  Lab 08/29/17 1214 09/02/17 1118  AST 33 32  ALT 34 25  ALKPHOS 64 64  BILITOT 0.7 1.2  PROT 7.1 7.5  ALBUMIN 3.5 3.0*   No results for input(s): LIPASE, AMYLASE in the last 168 hours. No results for input(s): AMMONIA in the last 168 hours. Coagulation Profile: Recent Labs  Lab 09/02/17 1118  INR 0.99   Cardiac Enzymes: No results for input(s): CKTOTAL, CKMB, CKMBINDEX, TROPONINI in the last 168 hours. BNP (last 3 results) No results for input(s): PROBNP in the last 8760 hours. HbA1C: No results for input(s): HGBA1C in the last 72 hours. CBG: Recent Labs  Lab 09/02/17 2056 09/03/17 0006 09/03/17 0802  GLUCAP 210* 188* 118*   Lipid Profile: No results for input(s): CHOL, HDL, LDLCALC, TRIG, CHOLHDL, LDLDIRECT in the last 72 hours. Thyroid Function Tests: No results for input(s): TSH, T4TOTAL, FREET4, T3FREE, THYROIDAB in the last 72 hours. Anemia Panel: No results for input(s): VITAMINB12, FOLATE, FERRITIN, TIBC, IRON, RETICCTPCT in the last 72 hours. Urine analysis:    Component Value Date/Time   COLORURINE YELLOW 09/02/2017 1151   APPEARANCEUR HAZY (A) 09/02/2017 1151   LABSPEC 1.008 09/02/2017 1151   PHURINE 6.0 09/02/2017 1151   GLUCOSEU NEGATIVE 09/02/2017 1151    HGBUR NEGATIVE 09/02/2017 1151   HGBUR negative 04/12/2008 1103   BILIRUBINUR NEGATIVE 09/02/2017 1151   KETONESUR NEGATIVE 09/02/2017 1151   PROTEINUR NEGATIVE 09/02/2017 1151   UROBILINOGEN 0.2 10/17/2012 1236   NITRITE NEGATIVE 09/02/2017 1151   LEUKOCYTESUR NEGATIVE 09/02/2017 1151   Sepsis Labs: @LABRCNTIP (procalcitonin:4,lacticidven:4)  ) Recent Results (from the past 240 hour(s))  Blood culture (routine x 2)     Status: None (Preliminary result)   Collection Time: 09/02/17 12:00 PM  Result Value Ref Range Status   Specimen Description BLOOD RIGHT ANTECUBITAL  Final   Special Requests   Final    BOTTLES DRAWN AEROBIC AND ANAEROBIC Blood Culture adequate volume   Culture NO GROWTH < 24 HOURS  Final   Report Status PENDING  Incomplete  Blood culture (routine x 2)     Status: None (Preliminary result)   Collection Time: 09/02/17 12:20 PM  Result Value Ref Range Status   Specimen Description  BLOOD RIGHT HAND  Final   Special Requests   Final    BOTTLES DRAWN AEROBIC AND ANAEROBIC Blood Culture adequate volume   Culture NO GROWTH < 24 HOURS  Final   Report Status PENDING  Incomplete         Radiology Studies: X-ray Chest Pa And Lateral  Result Date: 09/03/2017 CLINICAL DATA:  Cough.  Shortness of breath.  Pneumonia. EXAM: CHEST  2 VIEW COMPARISON:  09/02/2017 and multiple previous FINDINGS: Previous median sternotomy and CAB Heart size is normal. Atherosclerosis as seen previously. Chronic interstitial lung disease. Chronic air cysts in the upper lung, larger on the left than the right. No consolidation, collapse or effusion. IMPRESSION: Chronic interstitial lung disease. Chronic bilateral air cysts. No consolidation or collapse. Electronically Signed   By: Paulina Fusi M.D.   On: 09/03/2017 09:08   Dg Chest 2 View  Result Date: 09/02/2017 CLINICAL DATA:  Fall.  Cough and congestion. EXAM: CHEST  2 VIEW COMPARISON:  08/17/2017.  10/02/2016. FINDINGS: Prior CABG. Heart size  normal. Chronic bilateral interstitial prominence again noted consistent chronic interstitial lung disease. Bullous COPD. No focal alveolar infiltrate. No pleural effusion or pneumothorax. Degenerative change thoracic spine. IMPRESSION: 1. Chronic bilateral interstitial prominence again noted consistent with chronic interstitial lung disease. Bullous COPD. 2.  No acute pulmonary infiltrate. Electronically Signed   By: Maisie Fus  Register   On: 09/02/2017 12:55   Dg Knee Complete 4 Views Left  Result Date: 09/02/2017 CLINICAL DATA:  The patient fell last night and is complaining of anterior right knee pain and bruising. EXAM: LEFT KNEE - COMPLETE 4+ VIEW; RIGHT KNEE - COMPLETE 4+ VIEW COMPARISON:  None in PACs FINDINGS: Right knee: The bones are subjectively adequately mineralized. There is moderate narrowing of all 3 joint compartments. There irregularity of the articular surfaces of the femoral condyles. There is spurring of the tibial spines and articular margins of the patella. There is no acute fracture or dislocation. There is no joint effusion. There popliteal artery calcification. Left knee: The bones are subjectively adequately mineralized. There mild-to-moderate narrowing of all 3 joint compartments. There beaking of the tibial spines and spurring of the articular margins of the patella. There is no acute fracture or dislocation. There is no joint effusion. 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 No acute fracture, dislocation, or joint effusion. Electronically Signed   By: David  [place] M.D.   On: 09/02/2017 12:54   Dg Knee Complete 4 Views Right  Result Date: 09/02/2017 CLINICAL DATA:  The patient fell last night and is complaining of anterior right knee pain and bruising. EXAM: LEFT KNEE - COMPLETE 4+ VIEW; RIGHT KNEE - COMPLETE 4+ VIEW COMPARISON:  None in PACs FINDINGS: Right knee: The bones are subjectively adequately mineralized.  There is moderate narrowing of all 3 joint compartments. There irregularity of the articular surfaces of the femoral condyles. There is spurring of the tibial spines and articular margins of the patella. There is no acute fracture or dislocation. There is no joint effusion. There popliteal artery calcification. Left knee: The bones are subjectively adequately mineralized. There mild-to-moderate narrowing of all 3 joint compartments. There beaking of the tibial spines and spurring of the articular margins of the patella. There is no acute fracture or dislocation. There is no joint effusion. 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 No acute fracture, dislocation, or joint effusion. Electronically Signed   By: David  [place]  M.D.   On: 09/02/2017 12:54        Scheduled Meds: . aspirin EC  81 mg Oral Daily  . atorvastatin  80 mg Oral q1800  . dextromethorphan-guaiFENesin  1 tablet Oral BID  . enoxaparin (LOVENOX) injection  40 mg Subcutaneous Q24H  . ferrous sulfate  325 mg Oral Q breakfast  . umeclidinium-vilanterol  1 puff Inhalation Daily   And  . fluticasone  1 puff Inhalation BID  . folic acid  1 mg Oral Daily  . insulin glargine  10 Units Subcutaneous QHS  . ipratropium-albuterol  3 mL Nebulization TID  . loteprednol  1 drop Both Eyes Daily  . mouth rinse  15 mL Mouth Rinse BID  . oxyCODONE-acetaminophen  1 tablet Oral TID  . pantoprazole  40 mg Oral BID  . potassium chloride SA  40 mEq Oral Daily  . predniSONE  30 mg Oral Q breakfast  . pregabalin  75 mg Oral BID  . vitamin C  500 mg Oral Daily   Continuous Infusions:   LOS: 1 day    Time spent:    Zannie Cove, MD [facility] Page via [email or web address], password [email or web address] After 7PM please contact night-coverage  09/03/2017, 2:05 PM

## 2017-09-03 NOTE — Progress Notes (Signed)
Patient and wife seen as a Paediatric nurse after he was readmitted. Well known to our service with recent admission for sepsis of unclear source. H/O RA on methotrexate, prednisone, COPD, bronchiectasis Now back with fevers, mild elevation in LA.  Resp status and hemodynamics are stable. Pt is DNR  Discussed with Dr. Jomarie Longs, Hospitalist and agree with plan. We will be available as needed during this admission.  Chilton Greathouse MD [facility] Pager [phone] If no answer or after 3pm call: [phone] 09/03/2017, 10:48 AM

## 2017-09-04 ENCOUNTER — Encounter (HOSPITAL_COMMUNITY): Payer: Self-pay | Admitting: General Practice

## 2017-09-04 LAB — PROCALCITONIN

## 2017-09-04 LAB — COMPREHENSIVE METABOLIC PANEL
ALT: 22 U/L (ref 17–63)
ANION GAP: 8 (ref 5–15)
AST: 23 U/L (ref 15–41)
Albumin: 2.6 g/dL — ABNORMAL LOW (ref 3.5–5.0)
Alkaline Phosphatase: 51 U/L (ref 38–126)
BUN: 8 mg/dL (ref 6–20)
CALCIUM: 8.5 mg/dL — AB (ref 8.9–10.3)
CO2: 26 mmol/L (ref 22–32)
CREATININE: 0.53 mg/dL — AB (ref 0.61–1.24)
Chloride: 102 mmol/L (ref 101–111)
Glucose, Bld: 91 mg/dL (ref 65–99)
Potassium: 3.2 mmol/L — ABNORMAL LOW (ref 3.5–5.1)
Sodium: 136 mmol/L (ref 135–145)
Total Bilirubin: 0.8 mg/dL (ref 0.3–1.2)
Total Protein: 6.2 g/dL — ABNORMAL LOW (ref 6.5–8.1)

## 2017-09-04 LAB — GLUCOSE, CAPILLARY
GLUCOSE-CAPILLARY: 95 mg/dL (ref 65–99)
Glucose-Capillary: 88 mg/dL (ref 65–99)
Glucose-Capillary: 167 mg/dL — ABNORMAL HIGH (ref 65–99)

## 2017-09-04 MED ORDER — PREDNISONE 10 MG PO TABS: 30.0000 mg | ORAL_TABLET | Freq: Every day | ORAL | 0 refills | Status: DC

## 2017-09-04 NOTE — Progress Notes (Signed)
Visited with family regarding Medical Advanced Directive consult.  Family member states that [employer] had just left also. They already have document in the safe at home and will make sure their primary care doctor gets a copy.  Patient instructed to bring copy when/if he returns for any follow up visit and they can place it in his file.    No additional information or follow up needed at this time.    09/04/17 1321  Clinical Encounter Type  Visited With Patient and family together  Visit Type Initial;Spiritual support  Spiritual Encounters  Spiritual Needs Literature (Advanced Directive info)

## 2017-09-04 NOTE — Progress Notes (Signed)
Discharge instructions read and given to pt and wife. Both verbalized understanding. All questions answered. Belongings returned to pt and wife. Follow up appointments discussed with pt.  VSS. IV and telemetry removed. Pt will be discharged via wheelchair. Wife waiting in main lobby.

## 2017-09-04 NOTE — Discharge Summary (Signed)
Physician Discharge Summary  Joseph [name] BWI:[id number] DOB: July 05, 1951 DOA: 09/02/2017  PCP: Wanda Plump, MD  Admit date: 09/02/2017 Discharge date: 09/04/2017  Time spent: 45 minutes  Recommendations for Outpatient Follow-up:  1. PCP in 1 week 2. Rheumatologist Dr.Rivadeneira at [facility] in 1 week-pt has appt on 1/18, please check CBC at FU and consider Heme referral if no improvement in Leukopenia 3. 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 -Completed course of vancomycin and cefepime inpatient and all cultures were negative then   Hospital Course:  Fever/SIRs-due to Coronavirus infection-URI -Patient admitted with worsening of his chronic cough with nasal congestion and stuffiness -WBC differential with lymphocytosis which is more suggestive of viral illness -Chest x-ray noted chronic changes only, urinalysis was unremarkable -Respiratory virus panel came back positive for coronavirus  O C43 -Clinically improved with supportive care, blood cultures negative Pro calcitonin was less than 1 and stopped his antibiotics yesterday -felt to be clinically stable for discharge home -Advised follow-up with his rheumatologist next week he has an appointment at [facility] on the 18th, needs follow-up of his CBC and if he has no improvement in his leukopenia consider outpatient referral to hematology. -Remains off methotrexate due to recurrent infections as well as leukopenia -Transitioned back to prednisone dose which he was on just prior to this hospitalization he is always on a slow taper by his rheumatologist recommendations, he was taking 25 mg daily for 1 week which was being tapered by 5 mg on a weekly basis, at this time of discharge he will take 30 mg daily until his follow-up with Rheum on 1/18  Severe rheumatoid arthritis -Followed by rheumatologist at [facility] -On prednisone taper since previous hospitalization was down to 25 mg per day for 1 week -Stopped stress dose steroids and restarted previously planned prednisone taper   COPD/chronic respiratory failure -On 3 L home oxygen at baseline, no wheezing or acute dyspnea at this time -Continue nebs as needed  History of CAD status post CABG   continue aspirin, not on beta blocker likely due to severe COPD  Chronic anemia and mild leukopenia/thrombocytopenia -Likely due to methotrexate, this has been held since last hospitalization/infection, 2 weeks ago, -Please monitor his leukopenia and if no improvement over the next few weeks consider hematology referral, may also need Neulasta if he drops further  Diabetes mellitus  -Continue Lantus, sliding scale insulin      Discharge Exam: Vitals:   09/04/17 0540 09/04/17 0755  BP: 121/64   Pulse: 86   Resp: 18   Temp: 98.6 F (37 C)   SpO2: 98% (!) 89%    General: AAOx3 Cardiovascular: S1S2/RRR Respiratory: improved air movement  Discharge  Instructions   Discharge Instructions    Diet - low sodium heart healthy   Complete by:  As directed    Increase activity slowly   Complete by:  As directed      Allergies as of 09/04/2017   No Known Allergies     Medication List    STOP taking these medications   zolpidem 10 MG tablet Commonly known as:  AMBIEN     TAKE these medications   acetaminophen 500 MG tablet Commonly known as:  TYLENOL Take 500 mg by mouth every 6 (six) hours as needed for mild pain or headache. Max of 2000 mg/day   albuterol 108 (90 Base) MCG/ACT inhaler Commonly known as:  PROAIR HFA Inhale 2 puffs into the lungs every 4 (four) hours as needed for wheezing or shortness of breath.   aspirin EC 81 MG tablet Take 81 mg by mouth daily.   atorvastatin 80 MG tablet Commonly known as:  LIPITOR TAKE 1 TABLET(80 MG) BY MOUTH DAILY   azelastine 0.1 % nasal spray Commonly known as:  ASTELIN Place 2 sprays into both nostrils 2 (two) times daily. Use in each nostril as directed   benzonatate 100 MG capsule Commonly known as:  TESSALON Take 1 capsule (100 mg total) by mouth 2 (two) times daily as needed for cough.   dextromethorphan-guaiFENesin 30-600 MG 12hr tablet Commonly known as:  MUCINEX DM Take 1 tablet by mouth 2 (two) times daily.   fluticasone 50 MCG/ACT nasal spray Commonly known as:  FLONASE Place 2 sprays into both nostrils 2 (two) times daily.   Fluticasone-Umeclidin-Vilant 100-62.5-25 MCG/INH Aepb Commonly known as:  TRELEGY ELLIPTA Inhale 1 puff daily into the lungs.   folic acid 800 MCG tablet Commonly known as:  FOLVITE Take 800 mcg by mouth daily.   furosemide 20 MG tablet Commonly known as:  LASIX Take 2 tablets (40 mg total) by mouth daily.   hydrocortisone cream 1 % Apply 1 application topically 3 (three) times daily as needed for itching (skin irritation).   insulin glargine 100 unit/mL Sopn Commonly known as:  LANTUS Inject 0.1 mLs (10 Units total) into the  skin at bedtime.   ipratropium-albuterol 0.5-2.5 (3) MG/3ML Soln Commonly known as:  DUONEB Take 3 mLs by nebulization every 4 (four) hours as needed.   LOTEMAX 0.5 % Gel Generic drug:  Loteprednol Etabonate Place 1 drop into both eyes every morning. daily   Melatonin 1 MG Caps Take 4 mg by mouth as needed (Sleep).   nitroGLYCERIN 0.4 MG SL tablet Commonly known as:  NITROSTAT Place 1 tablet (0.4 mg total) under the tongue every 5 (five) minutes as needed for chest pain (up to 3 doses for severe chest pain. If no relief after 3rd dose, proceed to the ED for an evaluation).   oxyCODONE-acetaminophen 7.5-325 MG tablet Commonly known as:  PERCOCET Take 1 tablet by mouth 3 (three) times daily. 3 times a day   OXYGEN Inhale 3 L into the lungs continuous.   pantoprazole 40 MG tablet Commonly known as:  PROTONIX Take 1 tablet (40 mg total) by mouth 2 (two) times daily.   PERFECT IRON PO Take 1 tablet by mouth every morning.   potassium chloride 10 MEQ tablet Commonly known as:  K-DUR Take  4 tablets (40 mEq total) by mouth daily.   predniSONE 10 MG tablet Commonly known as:  DELTASONE Take 3 tablets (30 mg total) by mouth daily with breakfast. Take 30mg  daily until your FU with Rheumatologist Next week What changed:    how much to take  additional instructions   pregabalin 75 MG capsule Commonly known as:  LYRICA Take 75 mg by mouth 2 (two) times daily.   PRESCRIPTION MEDICATION Apply 1 application topically as needed (psoriasis on scalp).   PRESCRIPTION MEDICATION Apply 1 application topically as needed (psoriasis on face).   PRESCRIPTION MEDICATION Apply 1 application topically as needed (psoriasis on body).   sodium chloride 0.65 % Soln nasal spray Commonly known as:  OCEAN Place 1 spray into both nostrils as needed for congestion.   SUMAtriptan 50 MG tablet Commonly known as:  IMITREX Take 1 tablet (50 mg total) by mouth daily as needed for migraine. May  repeat in 2 hours if headache persists or recurs.   SUPER B COMPLEX PO Take 1 tablet by mouth daily.   vitamin C 500 MG tablet Commonly known as:  ASCORBIC ACID Take 500 mg by mouth daily.   VITEYES AREDS ADVANCED PO Take 1 tablet by mouth daily.      No Known Allergies Follow-up Information    [facility] Follow up.   Specialty:  Home Health Services Why:  home health services arranged,office will call to set up home visits Contact information: [address] [place] [address] [phone]            The results of significant diagnostics from this hospitalization (including imaging, microbiology, ancillary and laboratory) are listed below for reference.    Significant Diagnostic Studies: Ct Abdomen Pelvis Wo Contrast  Result Date: 08/14/2017 CLINICAL DATA:  Acute respiratory illness with sepsis EXAM: CT CHEST, ABDOMEN AND PELVIS WITHOUT CONTRAST TECHNIQUE: Multidetector CT imaging of the chest, abdomen and pelvis was performed following the standard protocol without IV contrast. COMPARISON:  08/14/2017, 10/11/2009 FINDINGS: CT CHEST FINDINGS Cardiovascular: Limited without intravenous contrast. Moderate-to-marked atherosclerotic calcification of the aorta. No aneurysmal dilatation. Post CABG changes. Coronary artery calcification. Heart size upper normal. No pericardial effusion. Mediastinum/Nodes: Endotracheal tube tip terminate just above the carina. Esophageal tube tip is within the gastric outlet. No thyroid mass. Nonspecific subcentimeter mediastinal lymph nodes. Lungs/Pleura: Trace pleural effusion. Atelectasis within the posterior lung bases. Moderate to marked emphysema with large bulla in the upper lobes. Negative for a pneumothorax. Musculoskeletal: Postsurgical changes of the sternum. Degenerative changes of the spine. No acute or suspicious lesion CT ABDOMEN PELVIS FINDINGS Hepatobiliary: Surgical clips in the gallbladder fossa. No focal hepatic  abnormality or biliary dilatation. Suspected small stones in the distal common bile duct, measuring up to 7 mm. Pancreas: Unremarkable. No pancreatic ductal dilatation or surrounding inflammatory changes. Spleen: Normal in size without focal abnormality. Adrenals/Urinary Tract: Adrenal glands are within normal limits. No hydronephrosis. Nonspecific perinephric fat stranding. Multiple right greater than left intrarenal calculi, largest stone on the right is seen in the upper pole and measures 6 mm. Largest stone on the left is seen in the upper pole and measures 5 mm. Foley catheter and air in the empty bladder. Stomach/Bowel: Stomach is within normal limits. Appendix appears normal. No evidence of bowel wall thickening, distention, or inflammatory changes. Vascular/Lymphatic: Moderate to marked atherosclerotic vascular disease. No aneurysmal dilatation. No significant abdominal or pelvic adenopathy Reproductive: Prostate is unremarkable. Other: Small fat in the inguinal canals.  No free air or free fluid. Small fat in the umbilicus. Musculoskeletal: Degenerative changes. No acute or suspicious abnormality IMPRESSION: 1. Support lines and tubes as above. Tip of the endotracheal tube terminates just above the carina. 2. Trace pleural effusions and atelectasis in the bilateral lung bases. Moderate severe emphysema 3. No CT evidence for acute intra-abdominal or pelvic abnormality 4. Status post cholecystectomy. Suspect that there is choledocholithiasis. No significant biliary dilatation. 5. Multiple bilateral nonobstructing stones within the kidneys. Electronically Signed   By: Jasmine Pang M.D.   On: 08/14/2017 20:59   X-ray Chest Pa And Lateral  Result Date: 09/03/2017 CLINICAL DATA:  Cough.  Shortness of breath.  Pneumonia. EXAM: CHEST  2 VIEW COMPARISON:  09/02/2017 and multiple previous FINDINGS: Previous median sternotomy and CAB Heart size is normal. Atherosclerosis as seen previously. Chronic interstitial  lung disease. Chronic air cysts in the upper lung, larger on the left than the right. No consolidation, collapse or effusion. IMPRESSION: Chronic interstitial lung disease. Chronic bilateral air cysts. No consolidation or collapse. Electronically Signed   By: Paulina Fusi M.D.   On: 09/03/2017 09:08   Dg Chest 2 View  Result Date: 09/02/2017 CLINICAL DATA:  Fall.  Cough and congestion. EXAM: CHEST  2 VIEW COMPARISON:  08/17/2017.  10/02/2016. FINDINGS: Prior CABG. Heart size normal. Chronic bilateral interstitial prominence again noted consistent chronic interstitial lung disease. Bullous COPD. No focal alveolar infiltrate. No pleural effusion or pneumothorax. Degenerative change thoracic spine. IMPRESSION: 1. Chronic bilateral interstitial prominence again noted consistent with chronic interstitial lung disease. Bullous COPD. 2.  No acute pulmonary infiltrate. Electronically Signed   By: Maisie Fus  Register   On: 09/02/2017 12:55   Ct Head Wo Contrast  Result Date: 08/14/2017 CLINICAL DATA:  Altered mental status and fever EXAM: CT HEAD WITHOUT CONTRAST TECHNIQUE: Contiguous axial images were obtained from the base of the skull through the vertex without intravenous contrast. COMPARISON:  Head CT April 25, 2006; brain MRI April 27, 2006 FINDINGS: Brain: The ventricles are normal in size and configuration. There is no appreciable intracranial mass, hemorrhage, extra-axial fluid collection, or midline shift. There is an age uncertain infarct in the anterior limb of the left internal capsule. Elsewhere gray-white compartments appear normal. Vascular: No hyperdense vessel. There is calcification in each carotid siphon region. Skull: Bony calvarium appears intact. Sinuses/Orbits: There is extensive opacification of most of the right frontal sinus. There is opacification of multiple ethmoid air cells bilaterally. There is opacification of much of the right maxillary antrum with an air-fluid level on the right.  There is moderate mucosal thickening in the left maxillary antrum. There is air-fluid level in the right sphenoid sinus. Orbits appear symmetric bilaterally. Other: Mastoid air cells are clear. IMPRESSION: 1. Age uncertain and possibly recent infarct involving the anterior limb of the left internal capsule. Elsewhere gray-white compartments appear normal. No mass or hemorrhage. 2. Multifocal paranasal sinus disease with air-fluid levels in the right sphenoid and right maxillary sinuses. 3.  Foci of arterial vascular calcification noted. Electronically Signed   By: Bretta Bang III M.D.   On: 08/14/2017 02:53   Ct Chest Wo Contrast  Result Date: 08/14/2017 CLINICAL DATA:  Acute respiratory illness with sepsis EXAM: CT CHEST, ABDOMEN AND PELVIS WITHOUT CONTRAST TECHNIQUE: Multidetector CT imaging of the chest, abdomen and pelvis was performed following the standard protocol without IV contrast. COMPARISON:  08/14/2017, 10/11/2009 FINDINGS: CT CHEST FINDINGS Cardiovascular: Limited without intravenous contrast. Moderate-to-marked atherosclerotic calcification of the aorta. No aneurysmal  dilatation. Post CABG changes. Coronary artery calcification. Heart size upper normal. No pericardial effusion. Mediastinum/Nodes: Endotracheal tube tip terminate just above the carina. Esophageal tube tip is within the gastric outlet. No thyroid mass. Nonspecific subcentimeter mediastinal lymph nodes. Lungs/Pleura: Trace pleural effusion. Atelectasis within the posterior lung bases. Moderate to marked emphysema with large bulla in the upper lobes. Negative for a pneumothorax. Musculoskeletal: Postsurgical changes of the sternum. Degenerative changes of the spine. No acute or suspicious lesion CT ABDOMEN PELVIS FINDINGS Hepatobiliary: Surgical clips in the gallbladder fossa. No focal hepatic abnormality or biliary dilatation. Suspected small stones in the distal common bile duct, measuring up to 7 mm. Pancreas: Unremarkable.  No pancreatic ductal dilatation or surrounding inflammatory changes. Spleen: Normal in size without focal abnormality. Adrenals/Urinary Tract: Adrenal glands are within normal limits. No hydronephrosis. Nonspecific perinephric fat stranding. Multiple right greater than left intrarenal calculi, largest stone on the right is seen in the upper pole and measures 6 mm. Largest stone on the left is seen in the upper pole and measures 5 mm. Foley catheter and air in the empty bladder. Stomach/Bowel: Stomach is within normal limits. Appendix appears normal. No evidence of bowel wall thickening, distention, or inflammatory changes. Vascular/Lymphatic: Moderate to marked atherosclerotic vascular disease. No aneurysmal dilatation. No significant abdominal or pelvic adenopathy Reproductive: Prostate is unremarkable. Other: Small fat in the inguinal canals. No free air or free fluid. Small fat in the umbilicus. Musculoskeletal: Degenerative changes. No acute or suspicious abnormality IMPRESSION: 1. Support lines and tubes as above. Tip of the endotracheal tube terminates just above the carina. 2. Trace pleural effusions and atelectasis in the bilateral lung bases. Moderate severe emphysema 3. No CT evidence for acute intra-abdominal or pelvic abnormality 4. Status post cholecystectomy. Suspect that there is choledocholithiasis. No significant biliary dilatation. 5. Multiple bilateral nonobstructing stones within the kidneys. Electronically Signed   By: Jasmine Pang M.D.   On: 08/14/2017 20:59   Dg Chest Port 1 View  Result Date: 08/17/2017 CLINICAL DATA:  Respiratory failure EXAM: PORTABLE CHEST 1 VIEW COMPARISON:  Yesterday FINDINGS: Fairly stable inflation after extubation yesterday. There is interstitial opacity attributed to patient's severe emphysema, with thick wall bullae bilaterally. No acute opacity when compared to remote studies. No effusion or pneumothorax. Left IJ line with tip at the SVC level. IMPRESSION: 1.  No acute finding after extubation. 2. COPD. Electronically Signed   By: Marnee Spring M.D.   On: 08/17/2017 06:51   Dg Chest Port 1 View  Result Date: 08/16/2017 CLINICAL DATA:  Respiratory failure EXAM: PORTABLE CHEST 1 VIEW COMPARISON:  Yesterday FINDINGS: Endotracheal tube tip between the clavicular heads and carina. Left IJ line with tip at the upper SVC. An orogastric tube reaches the stomach at least. Emphysema with thick walled bullae apically. Reticular opacities at the bases attributed to bronchitis and atelectasis based on CT yesterday. No edema, effusion, or pneumothorax. Cardiomegaly. Status post CABG. IMPRESSION: 1. Stable positioning of tubes and central line. 2. COPD. Electronically Signed   By: Marnee Spring M.D.   On: 08/16/2017 07:01   Dg Chest Port 1 View  Result Date: 08/15/2017 CLINICAL DATA:  Respiratory failure EXAM: PORTABLE CHEST 1 VIEW COMPARISON:  Chest x-rays dated 08/14/2017 and 08/13/2017. Comparison chest x-ray dated 03/07/2016. FINDINGS: Endotracheal tube is well positioned with tip just above the level of the carina. Left IJ central line is stable in position with tip at the level of the upper/mid SVC. Enteric tube passes below the diaphragm. Cardiomediastinal silhouette  is stable. Atherosclerotic changes noted at the aortic arch. Median sternotomy wires appear intact and stable in alignment. Improved aeration at each lung base compared to yesterday's study suggesting improved fluid status. Coarse lung markings persist at each lung base, stable compared to the older study of 03/07/2016 indicating chronic interstitial lung disease/fibrosis. IMPRESSION: 1. Improved aeration at each lung base compared to yesterday's study suggesting improved fluid status and/or resolved atelectasis. No new lung findings. 2. Coarse lung markings at each lung base, stable compared to the older study of 03/07/2016 indicating some degree of chronic interstitial lung disease/fibrosis. 3.  Support apparatus is stable in position. 4. Aortic atherosclerosis. Electronically Signed   By: Bary Richard M.D.   On: 08/15/2017 07:14   Dg Chest Port 1 View  Result Date: 08/14/2017 CLINICAL DATA:  Intubated EXAM: PORTABLE CHEST 1 VIEW COMPARISON:  08/14/2017, 02/10/2017 FINDINGS: Left-sided central venous catheter overlies the SVC origin. Interval intubation, tip of the endotracheal tube is about 3.3 cm superior to the carina. Esophageal tube extends below diaphragm but the tip is non included. Post sternotomy changes. Worsening interstitial and alveolar opacity in the bilateral lung bases. Emphysematous disease and scarring. Stable cardiomediastinal silhouette with atherosclerosis. No pneumothorax. IMPRESSION: 1. Endotracheal tube tip about 3.3 cm superior to carina. Esophageal tube extends below diaphragm but the tip is non included 2. Worsening interstitial and alveolar opacity at the bilateral lung bases which may reflect pulmonary edema or pneumonia 3. Emphysema Electronically Signed   By: Jasmine Pang M.D.   On: 08/14/2017 15:51   Dg Chest Port 1 View  Result Date: 08/14/2017 CLINICAL DATA:  Central line placement EXAM: PORTABLE CHEST 1 VIEW COMPARISON:  08/14/2017 FINDINGS: Left jugular central venous catheter with the tip projecting over the SVC. Bilateral chronic diffuse interstitial thickening. Bilateral bullous disease. No focal consolidation or pneumothorax. Cardiomediastinal silhouette is stable. Prior CABG. IMPRESSION: 1. Left jugular central venous catheter with the tip projecting over the SVC. 2. Bilateral bullous disease. Electronically Signed   By: Elige Ko   On: 08/14/2017 10:10   Dg Chest Port 1 View  Result Date: 08/14/2017 CLINICAL DATA:  Altered mental status. EXAM: PORTABLE CHEST 1 VIEW COMPARISON:  Most recent radiographs 02/10/2017. Most recent CT 02/23/2016 FINDINGS: Post median sternotomy and CABG. Mild cardiomegaly. Unchanged mediastinal contours. Emphysema. Bleb  in the left upper lobe. Scarring in the periphery of the right upper lobe. No consolidation, pleural fluid or pneumothorax. No pulmonary edema. IMPRESSION: 1. No acute abnormality. 2. Emphysema with multifocal scarring. 3. Stable mild cardiomegaly. Electronically Signed   By: Rubye Oaks M.D.   On: 08/14/2017 01:48   Dg Knee Complete 4 Views Left  Result Date: 09/02/2017 CLINICAL DATA:  The patient fell last night and is complaining of anterior right knee pain and bruising. EXAM: LEFT KNEE - COMPLETE 4+ VIEW; RIGHT KNEE - COMPLETE 4+ VIEW COMPARISON:  None in PACs FINDINGS: Right knee: The bones are subjectively adequately mineralized. There is moderate narrowing of all 3 joint compartments. There irregularity of the articular surfaces of the femoral condyles. There is spurring of the tibial spines and articular margins of the patella. There is no acute fracture or dislocation. There is no joint effusion. There popliteal artery calcification. Left knee: The bones are subjectively adequately mineralized. There mild-to-moderate narrowing of all 3 joint compartments. There beaking of the tibial spines and spurring of the articular margins of the patella. There is no acute fracture or dislocation. There is no joint effusion. 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 No acute fracture, dislocation, or joint effusion. Electronically Signed   By: David  [place] M.D.   On: 09/02/2017 12:54   Dg Knee Complete 4 Views Right  Result Date: 09/02/2017 CLINICAL DATA:  The patient fell last night and is complaining of anterior right knee pain and bruising. EXAM: LEFT KNEE - COMPLETE 4+ VIEW; RIGHT KNEE - COMPLETE 4+ VIEW COMPARISON:  None in PACs FINDINGS: Right knee: The bones are subjectively adequately mineralized. There is moderate narrowing of all 3 joint compartments. There irregularity of the articular surfaces of the femoral condyles. There is  spurring of the tibial spines and articular margins of the patella. There is no acute fracture or dislocation. There is no joint effusion. There popliteal artery calcification. Left knee: The bones are subjectively adequately mineralized. There mild-to-moderate narrowing of all 3 joint compartments. There beaking of the tibial spines and spurring of the articular margins of the patella. There is no acute fracture or dislocation. There is no joint effusion. 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 No acute fracture, dislocation, or joint effusion. Electronically Signed   By: David  [place] M.D.   On: 09/02/2017 12:54   Dg Abd Portable 1v  Result Date: 08/14/2017 CLINICAL DATA:  67 year old male enteric tube placement. EXAM: PORTABLE ABDOMEN - 1 VIEW COMPARISON:  CT Abdomen and Pelvis 10/11/2009. Portable chest radiographs today and earlier. FINDINGS: Portable AP semi upright view at 1538 hours. Enteric tube courses to the stomach. The side holes at the low level of the distal gastric body. There is a paucity of other upper abdominal bowel gas. Cholecystectomy clips are new since 2011. Stable visible chest. IMPRESSION: Enteric tube placed into the stomach, side hole up the level of the distal gastric body. Electronically Signed   By: Odessa Fleming M.D.   On: 08/14/2017 15:57    Microbiology: Recent Results (from the past 240 hour(s))  Blood culture (routine x 2)     Status: None (Preliminary result)   Collection Time: 09/02/17 12:00 PM  Result Value Ref Range Status   Specimen Description BLOOD RIGHT ANTECUBITAL  Final   Special Requests   Final    BOTTLES DRAWN AEROBIC AND ANAEROBIC Blood Culture adequate volume   Culture NO GROWTH < 24 HOURS  Final   Report Status PENDING  Incomplete  Blood culture (routine x 2)     Status: None (Preliminary result)   Collection Time: 09/02/17 12:20 PM  Result Value Ref Range Status   Specimen Description  BLOOD RIGHT HAND  Final   Special Requests   Final    BOTTLES DRAWN AEROBIC AND ANAEROBIC Blood Culture adequate volume   Culture NO GROWTH < 24 HOURS  Final   Report Status PENDING  Incomplete  Respiratory Panel by PCR     Status: Abnormal   Collection Time: 09/03/17 10:11 AM  Result Value Ref Range Status   Adenovirus NOT DETECTED NOT DETECTED Final   Coronavirus 229E NOT DETECTED NOT DETECTED Final   Coronavirus HKU1 NOT DETECTED NOT DETECTED Final   Coronavirus NL63 NOT DETECTED NOT DETECTED Final   Coronavirus OC43 DETECTED (A) NOT DETECTED Final   Metapneumovirus NOT DETECTED NOT DETECTED Final   Rhinovirus / Enterovirus NOT DETECTED NOT DETECTED Final   Influenza A NOT DETECTED NOT DETECTED Final   Influenza B NOT DETECTED NOT DETECTED Final   Parainfluenza Virus 1 NOT DETECTED NOT DETECTED Final  Parainfluenza Virus 2 NOT DETECTED NOT DETECTED Final   Parainfluenza Virus 3 NOT DETECTED NOT DETECTED Final   Parainfluenza Virus 4 NOT DETECTED NOT DETECTED Final   Respiratory Syncytial Virus NOT DETECTED NOT DETECTED Final   Bordetella pertussis NOT DETECTED NOT DETECTED Final   Chlamydophila pneumoniae NOT DETECTED NOT DETECTED Final   Mycoplasma pneumoniae NOT DETECTED NOT DETECTED Final     Labs: Basic Metabolic Panel: Recent Labs  Lab 08/29/17 1214 09/02/17 1118 09/02/17 1715 09/04/17 0339  NA 135 130*  --  136  K 3.6 3.7  --  3.2*  CL 99 97*  --  102  CO2 27 24  --  26  GLUCOSE 98 190*  --  91  BUN 10 10  --  8  CREATININE 0.64 0.61 0.55* 0.53*  CALCIUM 9.0 8.9  --  8.5*   Liver Function Tests: Recent Labs  Lab 08/29/17 1214 09/02/17 1118 09/04/17 0339  AST 33 32 23  ALT 34 25 22  ALKPHOS 64 64 51  BILITOT 0.7 1.2 0.8  PROT 7.1 7.5 6.2*  ALBUMIN 3.5 3.0* 2.6*   No results for input(s): LIPASE, AMYLASE in the last 168 hours. No results for input(s): AMMONIA in the last 168 hours. CBC: Recent Labs  Lab 08/29/17 1214 09/02/17 1118 09/02/17 1715  09/03/17 1038  WBC 3.5* 2.3* 2.4* 2.2*  NEUTROABS 0.9* 1.1*  --  1.1*  HGB 14.3 13.4 12.3* 11.4*  HCT 43.5 40.0 37.5* 34.1*  MCV 91.1 87.5 87.6 86.5  PLT 214.0 143* 147* 118*   Cardiac Enzymes: No results for input(s): CKTOTAL, CKMB, CKMBINDEX, TROPONINI in the last 168 hours. BNP: BNP (last 3 results) No results for input(s): BNP in the last 8760 hours.  ProBNP (last 3 results) No results for input(s): PROBNP in the last 8760 hours.  CBG: Recent Labs  Lab 09/03/17 0802 09/03/17 1700 09/03/17 2135 09/04/17 0745 09/04/17 0836  GLUCAP 118* 142* 154* 95 88       Signed:  Zannie Cove MD.  [facility] 09/04/2017, 12:43 PM

## 2017-09-04 NOTE — Evaluation (Signed)
Physical Therapy Evaluation Patient Details Name: Joseph [name] MRN: [id number] DOB: August 05, 1951 Today's Date: 09/04/2017   History of Present Illness  Pt is a 67 y/o male admitted secondary fever and found to have SIRS. PMH including but not limited to a-fib, CAD s/p CABG in 2007, CHF, COPD (requires supplemental O2 at baseline), DM, HTN and RA.  Clinical Impression  Pt presented supine in bed with HOB elevated, awake and willing to participate in therapy session. Pt's wife present throughout session as well. Prior to admission, pt reported that he was independent with ADLs and could ambulate short distances without use of an AD, but has a platform RW to ambulate when needed. Pt currently performing bed mobility at supervision level and transfers with min guard. Pt is planning to d/c home today with his wife and is willing to participate in HHPT to improve his strength, balance, coordination and endurance. Pt would continue to benefit from skilled physical therapy services at this time while admitted and after d/c to address the below listed limitations in order to improve overall safety and independence with functional mobility.     Follow Up Recommendations Home health PT    Equipment Recommendations  None recommended by PT    Recommendations for Other Services       Precautions / Restrictions Precautions Precautions: Fall Precaution Comments: monitor SPO2 (pt is on 3L of O2 at all times at baseline) Restrictions Weight Bearing Restrictions: No      Mobility  Bed Mobility Overal bed mobility: Needs Assistance Bed Mobility: Supine to Sit     Supine to sit: Supervision     General bed mobility comments: supervision for safety  Transfers Overall transfer level: Needs assistance Equipment used: 1 person hand held assist Transfers: Sit to/from Stand;Stand Pivot Transfers Sit to Stand: Min guard Stand pivot transfers: Min guard       General transfer comment: min guard  for safety, increased time; pt cautious initially upon standing but very steady and safe  Ambulation/Gait                Stairs            Wheelchair Mobility    Modified Rankin (Stroke Patients Only)       Balance Overall balance assessment: Needs assistance Sitting-balance support: No upper extremity supported;Feet supported Sitting balance-Leahy Scale: Good     Standing balance support: During functional activity Standing balance-Leahy Scale: Fair                               Pertinent Vitals/Pain Pain Assessment: No/denies pain    Home Living Family/patient expects to be discharged to:: Private residence Living Arrangements: Spouse/significant other Available Help at Discharge: Family;Available 24 hours/day Type of Home: House Home Access: Level entry     Home Layout: Two level Home Equipment: Walker - 4 wheels;Electric scooter;Wheelchair - manual;Grab bars - tub/shower;Grab bars - toilet;Shower seat - built in;Other (comment)(platform walker, supplemental O2)      Prior Function Level of Independence: Independent with assistive device(s)         Comments: pt reported that on his "good days" he can ambulate short distances in his home without use of an AD and is independent with ADLs.     Hand Dominance        Extremity/Trunk Assessment   Upper Extremity Assessment Upper Extremity Assessment: (hand and joint ROM restrictions and deformities due to RA)  Lower Extremity Assessment Lower Extremity Assessment: (limited knee ROM secondary to RA)    Cervical / Trunk Assessment Cervical / Trunk Assessment: Kyphotic  Communication   Communication: No difficulties  Cognition Arousal/Alertness: Awake/alert Behavior During Therapy: WFL for tasks assessed/performed Overall Cognitive Status: Within Functional Limits for tasks assessed                                        General Comments      Exercises      Assessment/Plan    PT Assessment Patient needs continued PT services  PT Problem List Decreased strength;Decreased activity tolerance;Decreased balance;Decreased mobility;Decreased range of motion;Decreased coordination       PT Treatment Interventions DME instruction;Gait training;Functional mobility training;Therapeutic activities;Balance training;Therapeutic exercise;Neuromuscular re-education;Patient/family education    PT Goals (Current goals can be found in the Care Plan section)  Acute Rehab PT Goals Patient Stated Goal: return home today PT Goal Formulation: With patient/family Time For Goal Achievement: 09/18/17 Potential to Achieve Goals: Good    Frequency Min 3X/week   Barriers to discharge        Co-evaluation               AM-PAC PT "6 Clicks" Daily Activity  Outcome Measure Difficulty turning over in bed (including adjusting bedclothes, sheets and blankets)?: None Difficulty moving from lying on back to sitting on the side of the bed? : None Difficulty sitting down on and standing up from a chair with arms (e.g., wheelchair, bedside commode, etc,.)?: Unable Help needed moving to and from a bed to chair (including a wheelchair)?: A Little Help needed walking in hospital room?: A Little Help needed climbing 3-5 steps with a railing? : A Little 6 Click Score: 18    End of Session Equipment Utilized During Treatment: Oxygen Activity Tolerance: Patient tolerated treatment well Patient left: in chair;with call bell/phone within reach;with family/visitor present Nurse Communication: Mobility status PT Visit Diagnosis: Other abnormalities of gait and mobility (R26.89);Muscle weakness (generalized) (M62.81)    Time: 7654-6503 PT Time Calculation (min) (ACUTE ONLY): 34 min   Charges:   PT Evaluation $PT Eval Moderate Complexity: 1 Mod PT Treatments $Therapeutic Activity: 8-22 mins   PT G Codes:        [place], PT, DPT [phone]   Alessandra Bevels  Arnette Driggs 09/04/2017, 11:51 AM

## 2017-09-05 ENCOUNTER — Telehealth: Payer: Self-pay

## 2017-09-05 NOTE — Telephone Encounter (Signed)
But all I see is [name]'s chart

## 2017-09-05 NOTE — Telephone Encounter (Signed)
09/05/17  TCM Hospital Follow Up  Transition Care Management Follow-up Telephone Call  ADMISSION DATE: 09/02/2017  DISCHARGE DATE: 09/04/2017   How have you been since you were released from the hospital? Patient states he is feeling good.   Do you understand why you were in the hospital? Yes   Do you understand the discharge instrcutions? Yes    Items Reviewed:  Medications reviewed: Yes   Allergies reviewed: Yes   Dietary changes reviewed:Diabetic Heart Healthy   Referrals reviewed: Hospital follow up appointment scheduled.   Functional Questionnaire:   Activities of Daily Living (ADLs): Patient states he can perform all independently except needid assistance with ambulation.  Any patient concerns? Concerned tjhat WBC count up. Eager to start PT.   Confirmed importance and date/time of follow-up visits scheduled: Yes   Confirmed with patient if condition begins to worsen call PCP or go to the ER. Yes    Patient was given the office number and encouragred to call back with questions or concerns. Yes

## 2017-09-05 NOTE — Telephone Encounter (Signed)
Ok was forwarding actual phone call to you. So ill have to move it to Mrs. Pipan chart. May have to get IT to help.

## 2017-09-05 NOTE — Telephone Encounter (Signed)
This is a Dr Drue Novel patient

## 2017-09-05 NOTE — Telephone Encounter (Deleted)
09/05/17  TCM Hospital Follow Up  Transition Care Management Follow-up Telephone Call  ADMISSION DATE: 09/02/17  DISCHARGE DATE: 09/04/17   How have you been since you were released from the hospital?  Per patient she feels bad. Denies chest pain and SOB.    Do you understand why you were in the hospital? Yes   Do you understand the discharge instrcutions? Yes per husband but did not read until they were home.      Items Reviewed: Medications reviewed: Yes   Allergies reviewed:Yes   Dietary changes reviewed: Heart Healthy   Referrals reviewed: Yes appointment schedule with Dr. Drue Novel.   Functional Questionnaire:   Activities of Daily Living (ADLs): Patient needs assistance with all ADL's at this time.  Any patient concerns? Per husband there was a leak from patients implant dressing. States he was able to stop bleeding but patch was removed. States they will see home health nurse today and discuss with her. ( I called [facility] to see if patient can be seen sooner for wound care/check scheduled for 09/18/17). Had to leave message for return call.     Confirmed importance and date/time of follow-up visits scheduled: Yes   Confirmed with patient if condition begins to worsen call PCP or go to the ER.  Advised to go to ED if patient does not feel any better and symptoms worsen. Advised if area started to bleed again go to ED. Patient agreed. Says they will also discuss with [facility] nurse today.     Patient was given the office number and encouragred to call back with questions or concerns. Yes

## 2017-09-05 NOTE — Telephone Encounter (Signed)
TCM Hospital follow up call made to patient. Left message for return call. 

## 2017-09-07 LAB — CULTURE, BLOOD (ROUTINE X 2)
CULTURE: NO GROWTH
Culture: NO GROWTH
Special Requests: ADEQUATE
Special Requests: ADEQUATE

## 2017-09-08 ENCOUNTER — Other Ambulatory Visit: Payer: Self-pay | Admitting: Internal Medicine

## 2017-09-08 DIAGNOSIS — I2510 Atherosclerotic heart disease of native coronary artery without angina pectoris: Secondary | ICD-10-CM | POA: Diagnosis not present

## 2017-09-08 DIAGNOSIS — E1143 Type 2 diabetes mellitus with diabetic autonomic (poly)neuropathy: Secondary | ICD-10-CM | POA: Diagnosis not present

## 2017-09-08 DIAGNOSIS — E1142 Type 2 diabetes mellitus with diabetic polyneuropathy: Secondary | ICD-10-CM | POA: Diagnosis not present

## 2017-09-08 DIAGNOSIS — J9611 Chronic respiratory failure with hypoxia: Secondary | ICD-10-CM | POA: Diagnosis not present

## 2017-09-08 DIAGNOSIS — I110 Hypertensive heart disease with heart failure: Secondary | ICD-10-CM | POA: Diagnosis not present

## 2017-09-08 DIAGNOSIS — M069 Rheumatoid arthritis, unspecified: Secondary | ICD-10-CM | POA: Diagnosis not present

## 2017-09-08 DIAGNOSIS — M810 Age-related osteoporosis without current pathological fracture: Secondary | ICD-10-CM | POA: Diagnosis not present

## 2017-09-08 DIAGNOSIS — I4892 Unspecified atrial flutter: Secondary | ICD-10-CM | POA: Diagnosis not present

## 2017-09-08 DIAGNOSIS — L409 Psoriasis, unspecified: Secondary | ICD-10-CM | POA: Diagnosis not present

## 2017-09-08 DIAGNOSIS — J439 Emphysema, unspecified: Secondary | ICD-10-CM | POA: Diagnosis not present

## 2017-09-08 MED ORDER — BENZONATATE 100 MG PO CAPS: 100.0000 mg | ORAL_CAPSULE | Freq: Two times a day (BID) | ORAL | 0 refills | Status: DC | PRN

## 2017-09-08 NOTE — Telephone Encounter (Signed)
I sent a refill to [facility].

## 2017-09-08 NOTE — Telephone Encounter (Signed)
Noted  

## 2017-09-08 NOTE — Telephone Encounter (Signed)
Copied from CRM [id number]. Topic: Quick Communication - Rx Refill/Question >> Sep 08, 2017 11:05 AM Alexander Bergeron B wrote: Medication: benzonatate (TESSALON) 100 MG capsule [[id number]]  Has the patient contacted their pharmacy? No, but was told to

## 2017-09-08 NOTE — Telephone Encounter (Signed)
[facility] in [place]

## 2017-09-08 NOTE — Telephone Encounter (Signed)
Pt requesting refill on tessalon perles. Please advise.

## 2017-09-09 ENCOUNTER — Telehealth: Payer: Self-pay | Admitting: Internal Medicine

## 2017-09-09 DIAGNOSIS — L409 Psoriasis, unspecified: Secondary | ICD-10-CM | POA: Diagnosis not present

## 2017-09-09 DIAGNOSIS — M810 Age-related osteoporosis without current pathological fracture: Secondary | ICD-10-CM | POA: Diagnosis not present

## 2017-09-09 DIAGNOSIS — J9611 Chronic respiratory failure with hypoxia: Secondary | ICD-10-CM | POA: Diagnosis not present

## 2017-09-09 DIAGNOSIS — J439 Emphysema, unspecified: Secondary | ICD-10-CM | POA: Diagnosis not present

## 2017-09-09 DIAGNOSIS — E1142 Type 2 diabetes mellitus with diabetic polyneuropathy: Secondary | ICD-10-CM | POA: Diagnosis not present

## 2017-09-09 DIAGNOSIS — M069 Rheumatoid arthritis, unspecified: Secondary | ICD-10-CM | POA: Diagnosis not present

## 2017-09-09 DIAGNOSIS — I110 Hypertensive heart disease with heart failure: Secondary | ICD-10-CM | POA: Diagnosis not present

## 2017-09-09 DIAGNOSIS — I2510 Atherosclerotic heart disease of native coronary artery without angina pectoris: Secondary | ICD-10-CM | POA: Diagnosis not present

## 2017-09-09 DIAGNOSIS — E1143 Type 2 diabetes mellitus with diabetic autonomic (poly)neuropathy: Secondary | ICD-10-CM | POA: Diagnosis not present

## 2017-09-09 DIAGNOSIS — I4892 Unspecified atrial flutter: Secondary | ICD-10-CM | POA: Diagnosis not present

## 2017-09-09 NOTE — Telephone Encounter (Signed)
Copied from CRM [id number]. Topic: Inquiry >> Sep 09, 2017  9:42 AM Windy Kalata, NT wrote: Reason for CRM: Darl Pikes is a nurse calling from [facility] and is requesting 2 nursing visits a week for 5 weeks and then 1 visit for 3 weeks. Please advise  [phone]

## 2017-09-09 NOTE — Telephone Encounter (Signed)
LMOM for Darl Pikes, w/ verbal orders. Instructed to call if questions/concerns.

## 2017-09-10 ENCOUNTER — Telehealth: Payer: Self-pay | Admitting: Pulmonary Disease

## 2017-09-10 ENCOUNTER — Ambulatory Visit: Payer: Medicare HMO | Admitting: Adult Health

## 2017-09-10 ENCOUNTER — Encounter: Payer: Self-pay | Admitting: Adult Health

## 2017-09-10 DIAGNOSIS — I5032 Chronic diastolic (congestive) heart failure: Secondary | ICD-10-CM | POA: Diagnosis not present

## 2017-09-10 DIAGNOSIS — J439 Emphysema, unspecified: Secondary | ICD-10-CM

## 2017-09-10 DIAGNOSIS — E1140 Type 2 diabetes mellitus with diabetic neuropathy, unspecified: Secondary | ICD-10-CM

## 2017-09-10 DIAGNOSIS — M059 Rheumatoid arthritis with rheumatoid factor, unspecified: Secondary | ICD-10-CM | POA: Diagnosis not present

## 2017-09-10 NOTE — Progress Notes (Signed)
@Patient  ID: Joseph [name], male    DOB: 14-Jun-1951, 67 y.o.   MRN: [id number]  Chief Complaint  Patient presents with  . Follow-up    Bronchiectasis     Referring provider: Wanda Plump, MD  HPI: 67 yo male former smoker followed for COPD /Emphysema, , chronic aspiration /reflux, Bronchiectasis , O2 RF  Has Rheumatoid Arthritis on daily prednisone and MTX.   Pulmonary tests PFT 01/12/09 >> FEV1 3.02 (85%), FEV1% 68, TLC 6.35 (88%), DLCO 45%, no BD PFT 12/02/12 >> FEV1 3.11 (78%), FEV1% 77, TLC 6.32 (83%), DLCO 37%, no BD  CT chests CT chest 10/17/12 >> severe centrilobular emphysema, apical bullae, 1 cm nodule LUL new, 5 mm nodule RLL stable since 2009, BTX with GGO at bases CT chest 05/06/13 >> decreased size LUL nodule now 8 mm, 4 mm nodule RLL, 3 mm nodule RML, 7 mm nodule RML, 8 mm nodule lingula CT chest 04/05/14 >> atherosclerosis, moderate diffuse centrilobular and paraseptal emphysema, stable pulmonary nodules with dominant LUL nodule now 7 mm, steatosis of liver with ?early cirrhosis CT chest 05/02/15 >> stable pulmonary nodules since 2009, severe emphysema CT angio chest 01/19/16 >> bullous emphysema CT angio chest 02/23/16 >> 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  Cardiac tests Echo 01/21/16 >> EF 50 to 55%, grade 1 DD, mild LVH, mild AS, mild RV systolic dysfx   12/03/79  Follow up : Bronchiectasis /COPD , Post hospital follow up  Patient presents for a one-month follow-up.  Patient was recently hospitalized twice in the last month.  He was admitted December 20 with acute respiratory failure and sepsis of unknown etiology.  He required intubation and ventilator support.  He was underwent a lumbar puncture for possible meningitis but this was ruled out.  He was treated with aggressive IV antibiotics and antivirals.  He did  have septic shock that required IV pressors. On admission had significant fevers. Patient had very slow improvement.  On admission had significant delirium that slowly improved. Patient was discharged on December 28.  Patient continued to have persistent cough and congestion with return of fevers.  He was readmitted on January 8 - January 10  For SIRS, URI w/ positive viral panel for Coronavirus .  He was treated with initial IV antibiotics pro-calcitonin was normal and IV antibiotics were stopped.  Patient was recommended to follow-up with his rheumatologist due to recurrent infections and abnormal CBC with leukopenia and thrombocytopenia .  He was discharged on a prednisone taper.  He is currently on prednisone 20 mg daily. Since discharge patient is feeling much better. Energy is returning slowly . Still weak. Good appetite.  Remains on oxygen 3 L.  On TRELEGY daily.  Started on Duoneb As needed   Has a follow up with Rheumatology later this week . He is currently tapering prednisone .    No Known Allergies  Immunization History  Administered Date(s) Administered  . Influenza Split 06/26/2011, 05/08/2012  . Influenza Whole 06/24/2007, 04/26/2008, 06/21/2009  . Influenza, High Dose Seasonal PF 05/13/2017  . Influenza,inj,Quad PF,6+ Mos 04/26/2013, 05/11/2014, 05/16/2015, 05/09/2016  . Pneumococcal Conjugate-13 05/11/2014  . Pneumococcal Polysaccharide-23 06/09/2006, 04/09/2011, 05/13/2017  . Tdap 05/08/2012    Past Medical History:  Diagnosis Date  . CAD (coronary artery disease)    MI 2007, CABG  . Cataracts, bilateral   .  Complication of anesthesia    DIFFICULTY AFTER CABG HAD CONFUSION POST OP  . COPD (chronic obstructive pulmonary disease) (HCC)   . Diabetes mellitus    Dx 07-2008 A1C 6.2  . Esophageal dysmotility   . Fatty liver 10/11/09  . Gastroesophageal reflux disease   . Gastroparesis   . Hiatal hernia   . Hypertension   . Neuropathy    (related to RA per neuro note  05-2011)  . Onychomycosis   . Osteoporosis    per DEXA 05-2008  . Oxygen dependent    3l  . Peripheral polyneuropathy    Severe, causing problems with his feet, see surgeries  . Psoriasis   . Pulmonary nodules   . Rheumatoid arthritis [facility])    [facility]    Tobacco History: Social History   Tobacco Use  Smoking Status Former Smoker  . Packs/day: 1.00  . Years: 40.00  . Pack years: 40.00  . Last attempt to quit: 08/26/2005  . Years since quitting: 12.0  Smokeless Tobacco Former Neurosurgeon  . Quit date: 04/02/2006  Tobacco Comment   smoked x 40 years, 1 ppd   Counseling given: Not Answered Comment: smoked x 40 years, 1 ppd   Outpatient Encounter Medications as of 09/10/2017  Medication Sig  . acetaminophen (TYLENOL) 500 MG tablet Take 500 mg by mouth every 6 (six) hours as needed for mild pain or headache. Max of 2000 mg/day  . albuterol (PROAIR HFA) 108 (90 Base) MCG/ACT inhaler Inhale 2 puffs into the lungs every 4 (four) hours as needed for wheezing or shortness of breath.  . Ascorbic Acid (VITAMIN C) 500 MG tablet Take 500 mg by mouth daily.    Marland Kitchen aspirin EC 81 MG tablet Take 81 mg by mouth daily.  Marland Kitchen atorvastatin (LIPITOR) 80 MG tablet TAKE 1 TABLET(80 MG) BY MOUTH DAILY  . B Complex-C (SUPER B COMPLEX PO) Take 1 tablet by mouth daily.   . benzonatate (TESSALON) 100 MG capsule Take 1 capsule (100 mg total) by mouth 2 (two) times daily as needed for cough.  Marland Kitchen Carbonyl Iron (PERFECT IRON PO) Take 1 tablet by mouth every morning.   Marland Kitchen dextromethorphan-guaiFENesin (MUCINEX DM) 30-600 MG 12hr tablet Take 1 tablet by mouth 2 (two) times daily.  . Fluticasone-Umeclidin-Vilant (TRELEGY ELLIPTA) 100-62.5-25 MCG/INH AEPB Inhale 1 puff daily into the lungs.  . folic acid (FOLVITE) 800 MCG tablet Take 800 mcg by mouth daily.  . furosemide (LASIX) 20 MG tablet Take 2 tablets (40 mg total) by mouth daily.  . insulin glargine (LANTUS) 100 unit/mL SOPN Inject 0.1 mLs (10 Units total) into  the skin at bedtime.  . Loteprednol Etabonate (LOTEMAX) 0.5 % GEL Place 1 drop into both eyes every morning. daily  . Melatonin 1 MG CAPS Take 4 mg by mouth as needed (Sleep).  . Multiple Vitamins-Minerals (VITEYES AREDS ADVANCED PO) Take 1 tablet by mouth daily.  . nitroGLYCERIN (NITROSTAT) 0.4 MG SL tablet Place 1 tablet (0.4 mg total) under the tongue every 5 (five) minutes as needed for chest pain (up to 3 doses for severe chest pain. If no relief after 3rd dose, proceed to the ED for an evaluation).  Marland Kitchen oxyCODONE-acetaminophen (PERCOCET) 7.5-325 MG per tablet Take 1 tablet by mouth 3 (three) times daily. 3 times a day  . OXYGEN Inhale 3 L into the lungs continuous.  . pantoprazole (PROTONIX) 40 MG tablet Take 1 tablet (40 mg total) by mouth 2 (two) times daily.  . potassium chloride (K-DUR)  10 MEQ tablet Take 4 tablets (40 mEq total) by mouth daily.  . predniSONE (DELTASONE) 10 MG tablet Take 3 tablets (30 mg total) by mouth daily with breakfast. Take 30mg  daily until your FU with Rheumatologist Next week  . pregabalin (LYRICA) 75 MG capsule Take 75 mg by mouth 2 (two) times daily.  Marland Kitchen PRESCRIPTION MEDICATION Apply 1 application topically as needed (psoriasis on scalp).  Marland Kitchen PRESCRIPTION MEDICATION Apply 1 application topically as needed (psoriasis on face).  Marland Kitchen PRESCRIPTION MEDICATION Apply 1 application topically as needed (psoriasis on body).  . sodium chloride (OCEAN) 0.65 % SOLN nasal spray Place 1 spray into both nostrils as needed for congestion.  Marland Kitchen azelastine (ASTELIN) 0.1 % nasal spray Place 2 sprays into both nostrils 2 (two) times daily. Use in each nostril as directed (Patient not taking: Reported on 09/10/2017)  . fluticasone (FLONASE) 50 MCG/ACT nasal spray Place 2 sprays into both nostrils 2 (two) times daily. (Patient not taking: Reported on 09/10/2017)  . ipratropium-albuterol (DUONEB) 0.5-2.5 (3) MG/3ML SOLN Take 3 mLs by nebulization every 4 (four) hours as needed. (Patient not  taking: Reported on 09/10/2017)  . SUMAtriptan (IMITREX) 50 MG tablet Take 1 tablet (50 mg total) by mouth daily as needed for migraine. May repeat in 2 hours if headache persists or recurs. (Patient not taking: Reported on 09/10/2017)  . [DISCONTINUED] hydrocortisone cream 1 % Apply 1 application topically 3 (three) times daily as needed for itching (skin irritation). (Patient not taking: Reported on 09/10/2017)   No facility-administered encounter medications on file as of 09/10/2017.      Review of Systems  Constitutional:   No  weight loss, night sweats,  Fevers, chills, + fatigue, or  lassitude.  HEENT:   No headaches,  Difficulty swallowing,  Tooth/dental problems, or  Sore throat,                No sneezing, itching, ear ache, + nasal congestion, post nasal drip,   CV:  No chest pain,  Orthopnea, PND, swelling in lower extremities, anasarca, dizziness, palpitations, syncope.   GI  No heartburn, indigestion, abdominal pain, nausea, vomiting, diarrhea, change in bowel habits, loss of appetite, bloody stools.   Resp:  No chest wall deformity  Skin: no rash or lesions.  GU: no dysuria, change in color of urine, no urgency or frequency.  No flank pain, no hematuria   MS: Chronic joint pain    Physical Exam  BP 132/70 (BP Location: Left Arm, Cuff Size: Normal)   Pulse 81   Ht 6' (1.829 m)   Wt 201 lb 12.8 oz (91.5 kg)   SpO2 96%   BMI 27.37 kg/m   GEN: A/Ox3; pleasant , NAD, elderly chronically ill-appearing on oxygen   HEENT:  [place]/AT,  EACs-clear, TMs-wnl, NOSE-clear, THROAT-clear, no lesions, no postnasal drip or exudate noted.   NECK:  Supple w/ fair ROM; no JVD; normal carotid impulses w/o bruits; no thyromegaly or nodules palpated; no lymphadenopathy.    RESP decreased breath sounds in the bases with a few scattered rhonchi . no accessory muscle use, no dullness to percussion  CARD:  RRR, no m/r/g, trace peripheral edema, pulses intact, no cyanosis or  clubbing.  GI:   Soft & nt; nml bowel sounds; no organomegaly or masses detected.   Musco: Warm bil, Severe Arthritic changes  Neuro: alert, no focal deficits noted.    Skin: Warm, no lesions or rashes    Lab Results:  CBC    Component  Value Date/Time   WBC 2.2 (L) 09/03/2017 1038   RBC 3.94 (L) 09/03/2017 1038   HGB 11.4 (L) 09/03/2017 1038   HGB 13.9 11/26/2016 1547   HCT 34.1 (L) 09/03/2017 1038   HCT 39.9 11/26/2016 1547   PLT 118 (L) 09/03/2017 1038   PLT 134 (L) 11/26/2016 1547   MCV 86.5 09/03/2017 1038   MCV 87 11/26/2016 1547   MCH 28.9 09/03/2017 1038   MCHC 33.4 09/03/2017 1038   RDW 15.4 09/03/2017 1038   RDW 16.9 (H) 11/26/2016 1547   LYMPHSABS 0.9 09/03/2017 1038   LYMPHSABS 1.9 11/26/2016 1547   MONOABS 0.4 09/03/2017 1038   EOSABS 0.0 09/03/2017 1038   EOSABS 0.0 11/26/2016 1547   BASOSABS 0.0 09/03/2017 1038   BASOSABS 0.0 11/26/2016 1547    BMET    Component Value Date/Time   NA 136 09/04/2017 0339   NA 131 (L) 11/26/2016 1547   K 3.2 (L) 09/04/2017 0339   CL 102 09/04/2017 0339   CO2 26 09/04/2017 0339   GLUCOSE 91 09/04/2017 0339   BUN 8 09/04/2017 0339   BUN 14 11/26/2016 1547   CREATININE 0.53 (L) 09/04/2017 0339   CREATININE 0.74 10/14/2016 1206   CALCIUM 8.5 (L) 09/04/2017 0339   GFRNONAA >60 09/04/2017 0339   GFRAA >60 09/04/2017 0339    BNP    Component Value Date/Time   BNP 51.7 01/20/2016 0308    ProBNP    Component Value Date/Time   PROBNP 107.6 10/17/2012 1100    Imaging: Ct Abdomen Pelvis Wo Contrast  Result Date: 08/14/2017 CLINICAL DATA:  Acute respiratory illness with sepsis EXAM: CT CHEST, ABDOMEN AND PELVIS WITHOUT CONTRAST TECHNIQUE: Multidetector CT imaging of the chest, abdomen and pelvis was performed following the standard protocol without IV contrast. COMPARISON:  08/14/2017, 10/11/2009 FINDINGS: CT CHEST FINDINGS Cardiovascular: Limited without intravenous contrast. Moderate-to-marked atherosclerotic  calcification of the aorta. No aneurysmal dilatation. Post CABG changes. Coronary artery calcification. Heart size upper normal. No pericardial effusion. Mediastinum/Nodes: Endotracheal tube tip terminate just above the carina. Esophageal tube tip is within the gastric outlet. No thyroid mass. Nonspecific subcentimeter mediastinal lymph nodes. Lungs/Pleura: Trace pleural effusion. Atelectasis within the posterior lung bases. Moderate to marked emphysema with large bulla in the upper lobes. Negative for a pneumothorax. Musculoskeletal: Postsurgical changes of the sternum. Degenerative changes of the spine. No acute or suspicious lesion CT ABDOMEN PELVIS FINDINGS Hepatobiliary: Surgical clips in the gallbladder fossa. No focal hepatic abnormality or biliary dilatation. Suspected small stones in the distal common bile duct, measuring up to 7 mm. Pancreas: Unremarkable. No pancreatic ductal dilatation or surrounding inflammatory changes. Spleen: Normal in size without focal abnormality. Adrenals/Urinary Tract: Adrenal glands are within normal limits. No hydronephrosis. Nonspecific perinephric fat stranding. Multiple right greater than left intrarenal calculi, largest stone on the right is seen in the upper pole and measures 6 mm. Largest stone on the left is seen in the upper pole and measures 5 mm. Foley catheter and air in the empty bladder. Stomach/Bowel: Stomach is within normal limits. Appendix appears normal. No evidence of bowel wall thickening, distention, or inflammatory changes. Vascular/Lymphatic: Moderate to marked atherosclerotic vascular disease. No aneurysmal dilatation. No significant abdominal or pelvic adenopathy Reproductive: Prostate is unremarkable. Other: Small fat in the inguinal canals. No free air or free fluid. Small fat in the umbilicus. Musculoskeletal: Degenerative changes. No acute or suspicious abnormality IMPRESSION: 1. Support lines and tubes as above. Tip of the endotracheal tube  terminates just above the  carina. 2. Trace pleural effusions and atelectasis in the bilateral lung bases. Moderate severe emphysema 3. No CT evidence for acute intra-abdominal or pelvic abnormality 4. Status post cholecystectomy. Suspect that there is choledocholithiasis. No significant biliary dilatation. 5. Multiple bilateral nonobstructing stones within the kidneys. Electronically Signed   By: Jasmine Pang M.D.   On: 08/14/2017 20:59   X-ray Chest Pa And Lateral  Result Date: 09/03/2017 CLINICAL DATA:  Cough.  Shortness of breath.  Pneumonia. EXAM: CHEST  2 VIEW COMPARISON:  09/02/2017 and multiple previous FINDINGS: Previous median sternotomy and CAB Heart size is normal. Atherosclerosis as seen previously. Chronic interstitial lung disease. Chronic air cysts in the upper lung, larger on the left than the right. No consolidation, collapse or effusion. IMPRESSION: Chronic interstitial lung disease. Chronic bilateral air cysts. No consolidation or collapse. Electronically Signed   By: Paulina Fusi M.D.   On: 09/03/2017 09:08   Dg Chest 2 View  Result Date: 09/02/2017 CLINICAL DATA:  Fall.  Cough and congestion. EXAM: CHEST  2 VIEW COMPARISON:  08/17/2017.  10/02/2016. FINDINGS: Prior CABG. Heart size normal. Chronic bilateral interstitial prominence again noted consistent chronic interstitial lung disease. Bullous COPD. No focal alveolar infiltrate. No pleural effusion or pneumothorax. Degenerative change thoracic spine. IMPRESSION: 1. Chronic bilateral interstitial prominence again noted consistent with chronic interstitial lung disease. Bullous COPD. 2.  No acute pulmonary infiltrate. Electronically Signed   By: Maisie Fus  Register   On: 09/02/2017 12:55   Ct Head Wo Contrast  Result Date: 08/14/2017 CLINICAL DATA:  Altered mental status and fever EXAM: CT HEAD WITHOUT CONTRAST TECHNIQUE: Contiguous axial images were obtained from the base of the skull through the vertex without intravenous contrast.  COMPARISON:  Head CT April 25, 2006; brain MRI April 27, 2006 FINDINGS: Brain: The ventricles are normal in size and configuration. There is no appreciable intracranial mass, hemorrhage, extra-axial fluid collection, or midline shift. There is an age uncertain infarct in the anterior limb of the left internal capsule. Elsewhere gray-white compartments appear normal. Vascular: No hyperdense vessel. There is calcification in each carotid siphon region. Skull: Bony calvarium appears intact. Sinuses/Orbits: There is extensive opacification of most of the right frontal sinus. There is opacification of multiple ethmoid air cells bilaterally. There is opacification of much of the right maxillary antrum with an air-fluid level on the right. There is moderate mucosal thickening in the left maxillary antrum. There is air-fluid level in the right sphenoid sinus. Orbits appear symmetric bilaterally. Other: Mastoid air cells are clear. IMPRESSION: 1. Age uncertain and possibly recent infarct involving the anterior limb of the left internal capsule. Elsewhere gray-white compartments appear normal. No mass or hemorrhage. 2. Multifocal paranasal sinus disease with air-fluid levels in the right sphenoid and right maxillary sinuses. 3.  Foci of arterial vascular calcification noted. Electronically Signed   By: Bretta Bang III M.D.   On: 08/14/2017 02:53   Ct Chest Wo Contrast  Result Date: 08/14/2017 CLINICAL DATA:  Acute respiratory illness with sepsis EXAM: CT CHEST, ABDOMEN AND PELVIS WITHOUT CONTRAST TECHNIQUE: Multidetector CT imaging of the chest, abdomen and pelvis was performed following the standard protocol without IV contrast. COMPARISON:  08/14/2017, 10/11/2009 FINDINGS: CT CHEST FINDINGS Cardiovascular: Limited without intravenous contrast. Moderate-to-marked atherosclerotic calcification of the aorta. No aneurysmal dilatation. Post CABG changes. Coronary artery calcification. Heart size upper normal. No  pericardial effusion. Mediastinum/Nodes: Endotracheal tube tip terminate just above the carina. Esophageal tube tip is within the gastric outlet. No thyroid mass. Nonspecific subcentimeter  mediastinal lymph nodes. Lungs/Pleura: Trace pleural effusion. Atelectasis within the posterior lung bases. Moderate to marked emphysema with large bulla in the upper lobes. Negative for a pneumothorax. Musculoskeletal: Postsurgical changes of the sternum. Degenerative changes of the spine. No acute or suspicious lesion CT ABDOMEN PELVIS FINDINGS Hepatobiliary: Surgical clips in the gallbladder fossa. No focal hepatic abnormality or biliary dilatation. Suspected small stones in the distal common bile duct, measuring up to 7 mm. Pancreas: Unremarkable. No pancreatic ductal dilatation or surrounding inflammatory changes. Spleen: Normal in size without focal abnormality. Adrenals/Urinary Tract: Adrenal glands are within normal limits. No hydronephrosis. Nonspecific perinephric fat stranding. Multiple right greater than left intrarenal calculi, largest stone on the right is seen in the upper pole and measures 6 mm. Largest stone on the left is seen in the upper pole and measures 5 mm. Foley catheter and air in the empty bladder. Stomach/Bowel: Stomach is within normal limits. Appendix appears normal. No evidence of bowel wall thickening, distention, or inflammatory changes. Vascular/Lymphatic: Moderate to marked atherosclerotic vascular disease. No aneurysmal dilatation. No significant abdominal or pelvic adenopathy Reproductive: Prostate is unremarkable. Other: Small fat in the inguinal canals. No free air or free fluid. Small fat in the umbilicus. Musculoskeletal: Degenerative changes. No acute or suspicious abnormality IMPRESSION: 1. Support lines and tubes as above. Tip of the endotracheal tube terminates just above the carina. 2. Trace pleural effusions and atelectasis in the bilateral lung bases. Moderate severe emphysema 3. No  CT evidence for acute intra-abdominal or pelvic abnormality 4. Status post cholecystectomy. Suspect that there is choledocholithiasis. No significant biliary dilatation. 5. Multiple bilateral nonobstructing stones within the kidneys. Electronically Signed   By: Jasmine Pang M.D.   On: 08/14/2017 20:59   Dg Chest Port 1 View  Result Date: 08/17/2017 CLINICAL DATA:  Respiratory failure EXAM: PORTABLE CHEST 1 VIEW COMPARISON:  Yesterday FINDINGS: Fairly stable inflation after extubation yesterday. There is interstitial opacity attributed to patient's severe emphysema, with thick wall bullae bilaterally. No acute opacity when compared to remote studies. No effusion or pneumothorax. Left IJ line with tip at the SVC level. IMPRESSION: 1. No acute finding after extubation. 2. COPD. Electronically Signed   By: Marnee Spring M.D.   On: 08/17/2017 06:51   Dg Chest Port 1 View  Result Date: 08/16/2017 CLINICAL DATA:  Respiratory failure EXAM: PORTABLE CHEST 1 VIEW COMPARISON:  Yesterday FINDINGS: Endotracheal tube tip between the clavicular heads and carina. Left IJ line with tip at the upper SVC. An orogastric tube reaches the stomach at least. Emphysema with thick walled bullae apically. Reticular opacities at the bases attributed to bronchitis and atelectasis based on CT yesterday. No edema, effusion, or pneumothorax. Cardiomegaly. Status post CABG. IMPRESSION: 1. Stable positioning of tubes and central line. 2. COPD. Electronically Signed   By: Marnee Spring M.D.   On: 08/16/2017 07:01   Dg Chest Port 1 View  Result Date: 08/15/2017 CLINICAL DATA:  Respiratory failure EXAM: PORTABLE CHEST 1 VIEW COMPARISON:  Chest x-rays dated 08/14/2017 and 08/13/2017. Comparison chest x-ray dated 03/07/2016. FINDINGS: Endotracheal tube is well positioned with tip just above the level of the carina. Left IJ central line is stable in position with tip at the level of the upper/mid SVC. Enteric tube passes below the  diaphragm. Cardiomediastinal silhouette is stable. Atherosclerotic changes noted at the aortic arch. Median sternotomy wires appear intact and stable in alignment. Improved aeration at each lung base compared to yesterday's study suggesting improved fluid status. Coarse lung markings persist  at each lung base, stable compared to the older study of 03/07/2016 indicating chronic interstitial lung disease/fibrosis. IMPRESSION: 1. Improved aeration at each lung base compared to yesterday's study suggesting improved fluid status and/or resolved atelectasis. No new lung findings. 2. Coarse lung markings at each lung base, stable compared to the older study of 03/07/2016 indicating some degree of chronic interstitial lung disease/fibrosis. 3. Support apparatus is stable in position. 4. Aortic atherosclerosis. Electronically Signed   By: Bary Richard M.D.   On: 08/15/2017 07:14   Dg Chest Port 1 View  Result Date: 08/14/2017 CLINICAL DATA:  Intubated EXAM: PORTABLE CHEST 1 VIEW COMPARISON:  08/14/2017, 02/10/2017 FINDINGS: Left-sided central venous catheter overlies the SVC origin. Interval intubation, tip of the endotracheal tube is about 3.3 cm superior to the carina. Esophageal tube extends below diaphragm but the tip is non included. Post sternotomy changes. Worsening interstitial and alveolar opacity in the bilateral lung bases. Emphysematous disease and scarring. Stable cardiomediastinal silhouette with atherosclerosis. No pneumothorax. IMPRESSION: 1. Endotracheal tube tip about 3.3 cm superior to carina. Esophageal tube extends below diaphragm but the tip is non included 2. Worsening interstitial and alveolar opacity at the bilateral lung bases which may reflect pulmonary edema or pneumonia 3. Emphysema Electronically Signed   By: Jasmine Pang M.D.   On: 08/14/2017 15:51   Dg Chest Port 1 View  Result Date: 08/14/2017 CLINICAL DATA:  Central line placement EXAM: PORTABLE CHEST 1 VIEW COMPARISON:   08/14/2017 FINDINGS: Left jugular central venous catheter with the tip projecting over the SVC. Bilateral chronic diffuse interstitial thickening. Bilateral bullous disease. No focal consolidation or pneumothorax. Cardiomediastinal silhouette is stable. Prior CABG. IMPRESSION: 1. Left jugular central venous catheter with the tip projecting over the SVC. 2. Bilateral bullous disease. Electronically Signed   By: Elige Ko   On: 08/14/2017 10:10   Dg Chest Port 1 View  Result Date: 08/14/2017 CLINICAL DATA:  Altered mental status. EXAM: PORTABLE CHEST 1 VIEW COMPARISON:  Most recent radiographs 02/10/2017. Most recent CT 02/23/2016 FINDINGS: Post median sternotomy and CABG. Mild cardiomegaly. Unchanged mediastinal contours. Emphysema. Bleb in the left upper lobe. Scarring in the periphery of the right upper lobe. No consolidation, pleural fluid or pneumothorax. No pulmonary edema. IMPRESSION: 1. No acute abnormality. 2. Emphysema with multifocal scarring. 3. Stable mild cardiomegaly. Electronically Signed   By: Rubye Oaks M.D.   On: 08/14/2017 01:48   Dg Knee Complete 4 Views Left  Result Date: 09/02/2017 CLINICAL DATA:  The patient fell last night and is complaining of anterior right knee pain and bruising. EXAM: LEFT KNEE - COMPLETE 4+ VIEW; RIGHT KNEE - COMPLETE 4+ VIEW COMPARISON:  None in PACs FINDINGS: Right knee: The bones are subjectively adequately mineralized. There is moderate narrowing of all 3 joint compartments. There irregularity of the articular surfaces of the femoral condyles. There is spurring of the tibial spines and articular margins of the patella. There is no acute fracture or dislocation. There is no joint effusion. There popliteal artery calcification. Left knee: The bones are subjectively adequately mineralized. There mild-to-moderate narrowing of all 3 joint compartments. There beaking of the tibial spines and spurring of the articular margins of the patella. There is no  acute fracture or dislocation. There is no joint effusion. 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 No acute fracture, dislocation, or joint effusion. Electronically Signed   By: David  [place] M.D.   On: 09/02/2017 12:54   Dg Knee Complete  4 Views Right  Result Date: 09/02/2017 CLINICAL DATA:  The patient fell last night and is complaining of anterior right knee pain and bruising. EXAM: LEFT KNEE - COMPLETE 4+ VIEW; RIGHT KNEE - COMPLETE 4+ VIEW COMPARISON:  None in PACs FINDINGS: Right knee: The bones are subjectively adequately mineralized. There is moderate narrowing of all 3 joint compartments. There irregularity of the articular surfaces of the femoral condyles. There is spurring of the tibial spines and articular margins of the patella. There is no acute fracture or dislocation. There is no joint effusion. There popliteal artery calcification. Left knee: The bones are subjectively adequately mineralized. There mild-to-moderate narrowing of all 3 joint compartments. There beaking of the tibial spines and spurring of the articular margins of the patella. There is no acute fracture or dislocation. There is no joint effusion. 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 No acute fracture, dislocation, or joint effusion. Electronically Signed   By: David  [place] M.D.   On: 09/02/2017 12:54   Dg Abd Portable 1v  Result Date: 08/14/2017 CLINICAL DATA:  67 year old male enteric tube placement. EXAM: PORTABLE ABDOMEN - 1 VIEW COMPARISON:  CT Abdomen and Pelvis 10/11/2009. Portable chest radiographs today and earlier. FINDINGS: Portable AP semi upright view at 1538 hours. Enteric tube courses to the stomach. The side holes at the low level of the distal gastric body. There is a paucity of other upper abdominal bowel gas. Cholecystectomy clips are new since 2011. Stable visible chest.  IMPRESSION: Enteric tube placed into the stomach, side hole up the level of the distal gastric body. Electronically Signed   By: Odessa Fleming M.D.   On: 08/14/2017 15:57     Assessment & Plan:   Chronic obstructive pulmonary emphysema (HCC) Recent severe exacerbation during an acute illness with sepsis patient is slowly improving.  We will continue on his current regimen.  Plan  Patient Instructions  Continue on Mucinex DM Twice daily  As needed  Cough /congestion  . With flutter valva.  Continue on TRELEGY daily  Taper Prednisone as directed by Rheumatology .  Decrease Lantus to 5 units daily , hold if blood sugar <80. Discuss with Dr. Drue Novel regarding further usage next week.  Follow up with Dr. Craige Cotta  4 weeks and As needed   Please contact office for sooner follow up if symptoms do not improve or worsen or seek emergency care       Chronic diastolic heart failure (HCC) Appears compensated without evidence of volume overload on exam continue on current regimen  Rheumatoid arthritis with rheumatoid factor (HCC) Continue follow-up with rheumatology as planned  Type 2 diabetes, controlled, with neuropathy (HCC) Recent flare with hyperglycemia on steroids.  Patient's blood sugars are trending down with daily checks around 80-100.  Patient can decrease Lantus to 5 units to avoid hypoglycemic episodes.  Will follow up with primary care next week.  Advised to hold Lantus insulin if blood sugars are less than 80     Rubye Oaks, NP

## 2017-09-10 NOTE — Patient Instructions (Addendum)
Continue on Mucinex DM Twice daily  As needed  Cough /congestion  . With flutter valva.  Continue on TRELEGY daily  Taper Prednisone as directed by Rheumatology .  Decrease Lantus to 5 units daily , hold if blood sugar <80. Discuss with Dr. Drue Novel regarding further usage next week.  Follow up with Dr. Craige Cotta  4 weeks and As needed   Please contact office for sooner follow up if symptoms do not improve or worsen or seek emergency care

## 2017-09-10 NOTE — Telephone Encounter (Signed)
Rec'd fax on 08-28-2017 from [facility] needing VS to complete forms VS completed and signed forms today,  Faxed back to [facility] ATTN Jasmine at fax [phone] Nothing further needed at this time

## 2017-09-11 ENCOUNTER — Encounter (HOSPITAL_COMMUNITY): Payer: Self-pay

## 2017-09-11 ENCOUNTER — Telehealth: Payer: Self-pay | Admitting: Pulmonary Disease

## 2017-09-11 DIAGNOSIS — M069 Rheumatoid arthritis, unspecified: Secondary | ICD-10-CM | POA: Diagnosis not present

## 2017-09-11 DIAGNOSIS — M810 Age-related osteoporosis without current pathological fracture: Secondary | ICD-10-CM | POA: Diagnosis not present

## 2017-09-11 DIAGNOSIS — E1143 Type 2 diabetes mellitus with diabetic autonomic (poly)neuropathy: Secondary | ICD-10-CM | POA: Diagnosis not present

## 2017-09-11 DIAGNOSIS — L409 Psoriasis, unspecified: Secondary | ICD-10-CM | POA: Diagnosis not present

## 2017-09-11 DIAGNOSIS — I110 Hypertensive heart disease with heart failure: Secondary | ICD-10-CM | POA: Diagnosis not present

## 2017-09-11 DIAGNOSIS — J9611 Chronic respiratory failure with hypoxia: Secondary | ICD-10-CM | POA: Diagnosis not present

## 2017-09-11 DIAGNOSIS — J439 Emphysema, unspecified: Secondary | ICD-10-CM | POA: Diagnosis not present

## 2017-09-11 DIAGNOSIS — I4892 Unspecified atrial flutter: Secondary | ICD-10-CM | POA: Diagnosis not present

## 2017-09-11 DIAGNOSIS — I2510 Atherosclerotic heart disease of native coronary artery without angina pectoris: Secondary | ICD-10-CM | POA: Diagnosis not present

## 2017-09-11 DIAGNOSIS — E1142 Type 2 diabetes mellitus with diabetic polyneuropathy: Secondary | ICD-10-CM | POA: Diagnosis not present

## 2017-09-11 NOTE — Telephone Encounter (Signed)
Copied from CRM [id number]. Topic: General - Other >> Sep 11, 2017  9:49 AM Raquel Sarna wrote: Please fax pt's lab work done on 09-02-17 to the office of: Dr. Annita Brod  - Rheumatologist/ [facility]  Please attention the fax to [facility] - fax # [phone] - Clemens Catholic will need the fax today for pt's visit on Friday 09-12-17.

## 2017-09-11 NOTE — Telephone Encounter (Signed)
Ok , good. Keep follow up with PCP .

## 2017-09-11 NOTE — Progress Notes (Signed)
Spoke with patient regarding pulmonary rehab referral (note on referral states patient wishes to go to [facility]). Patient states he has spoken to PR staff at [facility] and will follow up with them as appropriate. Referral to [facility] PR will be closed.

## 2017-09-11 NOTE — Assessment & Plan Note (Signed)
Recent flare with hyperglycemia on steroids.  Patient's blood sugars are trending down with daily checks around 80-100.  Patient can decrease Lantus to 5 units to avoid hypoglycemic episodes.  Will follow up with primary care next week.  Advised to hold Lantus insulin if blood sugars are less than 80

## 2017-09-11 NOTE — Assessment & Plan Note (Signed)
Continue follow-up with rheumatology as planned 

## 2017-09-11 NOTE — Assessment & Plan Note (Signed)
Recent severe exacerbation during an acute illness with sepsis patient is slowly improving.  We will continue on his current regimen.  Plan  Patient Instructions  Continue on Mucinex DM Twice daily  As needed  Cough /congestion  . With flutter valva.  Continue on TRELEGY daily  Taper Prednisone as directed by Rheumatology .  Decrease Lantus to 5 units daily , hold if blood sugar <80. Discuss with Dr. Drue Novel regarding further usage next week.  Follow up with Dr. Craige Cotta  4 weeks and As needed   Please contact office for sooner follow up if symptoms do not improve or worsen or seek emergency care

## 2017-09-11 NOTE — Assessment & Plan Note (Signed)
Appears compensated without evidence of volume overload on exam continue on current regimen

## 2017-09-11 NOTE — Telephone Encounter (Signed)
Labs from 08/29/2017 faxed- our office does not have labs from 09/02/2017 the hospital does- but we are unable to release those.

## 2017-09-11 NOTE — Telephone Encounter (Signed)
Will route this message to [employer] and Jess as an [place]

## 2017-09-11 NOTE — Progress Notes (Signed)
I have reviewed and agree with assessment/plan.  [name], MD [facility] Pulmonary/Critical Care 09/11/2017, 6:25 PM Pager:  [phone]

## 2017-09-12 MED ORDER — PREDNISONE 5 MG TABLET
ORAL_TABLET | Freq: Every day | ORAL | 6 refills | 0 days | Status: CP
Start: 2017-09-12 — End: 2017-10-10

## 2017-09-12 MED ORDER — PREDNISONE 10 MG TABLET
ORAL_TABLET | Freq: Every day | ORAL | 5 refills | 0.00000 days | Status: CP
Start: 2017-09-12 — End: 2017-10-10

## 2017-09-15 ENCOUNTER — Telehealth: Payer: Self-pay | Admitting: Internal Medicine

## 2017-09-15 ENCOUNTER — Telehealth: Payer: Self-pay | Admitting: *Deleted

## 2017-09-15 NOTE — Telephone Encounter (Signed)
Received Home Health Certification and Plan of Care; forwarded to provider/SLS 01/21  

## 2017-09-15 NOTE — Telephone Encounter (Signed)
Copied from CRM [id number]. Topic: General - Other >> Sep 15, 2017 12:25 PM Raquel Sarna wrote: Pt refused today's visit w/ Briscoe Deutscher at [facility].  Pt only wants nurse Barron Schmid to come to their home for nurse visits.  You can reach Tyrone at [phone]

## 2017-09-15 NOTE — Telephone Encounter (Signed)
Just an FYI

## 2017-09-16 DIAGNOSIS — E1142 Type 2 diabetes mellitus with diabetic polyneuropathy: Secondary | ICD-10-CM | POA: Diagnosis not present

## 2017-09-16 DIAGNOSIS — L409 Psoriasis, unspecified: Secondary | ICD-10-CM | POA: Diagnosis not present

## 2017-09-16 DIAGNOSIS — E1143 Type 2 diabetes mellitus with diabetic autonomic (poly)neuropathy: Secondary | ICD-10-CM | POA: Diagnosis not present

## 2017-09-16 DIAGNOSIS — I4892 Unspecified atrial flutter: Secondary | ICD-10-CM | POA: Diagnosis not present

## 2017-09-16 DIAGNOSIS — M810 Age-related osteoporosis without current pathological fracture: Secondary | ICD-10-CM | POA: Diagnosis not present

## 2017-09-16 DIAGNOSIS — M069 Rheumatoid arthritis, unspecified: Secondary | ICD-10-CM | POA: Diagnosis not present

## 2017-09-16 DIAGNOSIS — I2510 Atherosclerotic heart disease of native coronary artery without angina pectoris: Secondary | ICD-10-CM | POA: Diagnosis not present

## 2017-09-16 DIAGNOSIS — J9611 Chronic respiratory failure with hypoxia: Secondary | ICD-10-CM | POA: Diagnosis not present

## 2017-09-16 DIAGNOSIS — J439 Emphysema, unspecified: Secondary | ICD-10-CM | POA: Diagnosis not present

## 2017-09-16 DIAGNOSIS — I110 Hypertensive heart disease with heart failure: Secondary | ICD-10-CM | POA: Diagnosis not present

## 2017-09-17 ENCOUNTER — Encounter: Payer: Self-pay | Admitting: Internal Medicine

## 2017-09-17 ENCOUNTER — Ambulatory Visit (INDEPENDENT_AMBULATORY_CARE_PROVIDER_SITE_OTHER): Payer: Medicare HMO | Admitting: Internal Medicine

## 2017-09-17 VITALS — BP 132/74 | HR 76 | Temp 97.6°F | Resp 14 | Ht 72.0 in | Wt 205.5 lb

## 2017-09-17 DIAGNOSIS — J439 Emphysema, unspecified: Secondary | ICD-10-CM | POA: Diagnosis not present

## 2017-09-17 DIAGNOSIS — R6510 Systemic inflammatory response syndrome (SIRS) of non-infectious origin without acute organ dysfunction: Secondary | ICD-10-CM

## 2017-09-17 DIAGNOSIS — E1140 Type 2 diabetes mellitus with diabetic neuropathy, unspecified: Secondary | ICD-10-CM

## 2017-09-17 DIAGNOSIS — M059 Rheumatoid arthritis with rheumatoid factor, unspecified: Secondary | ICD-10-CM

## 2017-09-17 LAB — CBC WITH DIFFERENTIAL/PLATELET
BASOS ABS: 0 10*3/uL (ref 0.0–0.1)
BASOS PCT: 0.7 % (ref 0.0–3.0)
Eosinophils Absolute: 0 10*3/uL (ref 0.0–0.7)
Eosinophils Relative: 0.3 % (ref 0.0–5.0)
HEMATOCRIT: 41.2 % (ref 39.0–52.0)
Hemoglobin: 13.8 g/dL (ref 13.0–17.0)
LYMPHS PCT: 24.2 % (ref 12.0–46.0)
Lymphs Abs: 0.8 10*3/uL (ref 0.7–4.0)
MCHC: 33.4 g/dL (ref 30.0–36.0)
MCV: 88.1 fl (ref 78.0–100.0)
MONOS PCT: 16.3 % — AB (ref 3.0–12.0)
Monocytes Absolute: 0.5 10*3/uL (ref 0.1–1.0)
Neutro Abs: 1.9 10*3/uL (ref 1.4–7.7)
Neutrophils Relative %: 58.5 % (ref 43.0–77.0)
PLATELETS: 196 10*3/uL (ref 150.0–400.0)
RBC: 4.68 Mil/uL (ref 4.22–5.81)
RDW: 17.1 % — ABNORMAL HIGH (ref 11.5–15.5)
WBC: 3.3 10*3/uL — ABNORMAL LOW (ref 4.0–10.5)

## 2017-09-17 NOTE — Patient Instructions (Signed)
GO TO THE LAB : Get the blood work     GO TO THE FRONT DESK Schedule your next appointment for a in 3 months  Stop Lantus  Diabetes: Check your blood sugar  once a day        GOALS: Fasting before a meal 70- 130 2 hours after a meal less than 180

## 2017-09-17 NOTE — Progress Notes (Signed)
Subjective:    Patient ID: Joseph [name], male    DOB: Feb 22, 1951, 67 y.o.   MRN: [id number]  DOS:  09/17/2017 Type of visit - description : TCM 14 Interval history: After his last visit here 08/29/2017, he was admitted again on 09/02/2017 and discharged 2 days later. DX: Fever/SIRS, coronavirus infection. He was admitted with worsening chronic cough, lymphocytosis, chest x-ray with chronic changes, respiratory panel + coronavirus, blood cultures negative, initially on antibiotics but they were stopped.  he improved with supportive care. Was recommended to stay off MTX due to leukopenia. Was prescribed a prednisone taper.    Review of Systems  Since he left the hospital he is feeling better. No fever chills Difficulty breathing diminishing, closer to baseline. No chest pain or lower extremity edema No nausea, vomiting, diarrhea.  Appetite is good. CBGs are normal. Past Medical History:  Diagnosis Date  . CAD (coronary artery disease)    MI 2007, CABG  . Cataracts, bilateral   . Complication of anesthesia    DIFFICULTY AFTER CABG HAD CONFUSION POST OP  . COPD (chronic obstructive pulmonary disease) (HCC)   . Diabetes mellitus    Dx 07-2008 A1C 6.2  . Esophageal dysmotility   . Fatty liver 10/11/09  . Gastroesophageal reflux disease   . Gastroparesis   . Hiatal hernia   . Hypertension   . Neuropathy    (related to RA per neuro note 05-2011)  . Onychomycosis   . Osteoporosis    per DEXA 05-2008  . Oxygen dependent    3l  . Peripheral polyneuropathy    Severe, causing problems with his feet, see surgeries  . Psoriasis   . Pulmonary nodules   . Rheumatoid arthritis [facility])    [facility] Rheumatology    Past Surgical History:  Procedure Laterality Date  . A-FLUTTER ABLATION N/A 11/27/2016   Procedure: A-Flutter Ablation;  Surgeon: Marinus Maw, MD;  Location: [facility] INVASIVE CV LAB;  Service: Cardiovascular;  Laterality: N/A;  . achiles tendon surgery  8/09  . CARDIAC  CATHETERIZATION N/A 01/23/2016   Procedure: Left Heart Cath and Cors/Grafts Angiography;  Surgeon: Lyn Records, MD;  Location: [facility] INVASIVE CV LAB;  Service: Cardiovascular;  Laterality: N/A;  . CARDIOVERSION N/A 10/23/2016   Procedure: CARDIOVERSION;  Surgeon: Rollene Rotunda, MD;  Location: [facility] ENDOSCOPY;  Service: Cardiovascular;  Laterality: N/A;  . CATARACT EXTRACTION Right 03/2017  . CATARACT EXTRACTION Left 05/2017  . CHOLECYSTECTOMY    . CORONARY ARTERY BYPASS GRAFT  2007   (LIMA to the LAD, left radial to obtuse marginal, SVG to first diagonal, SVG to PDA. His ejection fraction to 50-55%  . ESOPHAGOGASTRODUODENOSCOPY N/A 10/19/2012   Procedure: ESOPHAGOGASTRODUODENOSCOPY (EGD);  Surgeon: Hart Carwin, MD;  Location: [facility] ENDOSCOPY;  Service: Endoscopy;  Laterality: N/A;  the dilatation is possible.   Marland Kitchen FOOT SURGERY     to tendon release and repair of osteomyelitis   . LARYNGOSCOPY N/A 01/21/2014   Procedure: LARYNGOSCOPY;  Surgeon: Christia Reading, MD;  Location: [facility] OR;  Service: ENT;  Laterality: N/A;  micro direct laryngoscopy with prolaryn injection/jet venturi ventilation  . TOE AMPUTATION  4-12/ 3 14   d/t a deformity, found to have osteomyelitis, complicated by post-op foot strtess FX    Social History   Socioeconomic History  . Marital status: Married    Spouse name: Not on file  . Number of children: 0  . Years of education: Not on file  . Highest education level: Not  on file  Social Needs  . Financial resource strain: Not on file  . Food insecurity - worry: Not on file  . Food insecurity - inability: Not on file  . Transportation needs - medical: Not on file  . Transportation needs - non-medical: Not on file  Occupational History  . Occupation: Disabled-retired    Associate Professor: UNEMPLOYED  Tobacco Use  . Smoking status: Former Smoker    Packs/day: 1.00    Years: 40.00    Pack years: 40.00    Last attempt to quit: 08/26/2005    Years since quitting: 12.0  . Smokeless  tobacco: Former Neurosurgeon    Quit date: 04/02/2006  . Tobacco comment: smoked x 40 years, 1 ppd  Substance and Sexual Activity  . Alcohol use: No  . Drug use: No  . Sexual activity: Not on file  Other Topics Concern  . Not on file  Social History Narrative   Household- pt and wife      Allergies as of 09/17/2017   No Known Allergies     Medication List        Accurate as of 09/17/17 11:59 PM. Always use your most recent med list.          acetaminophen 500 MG tablet Commonly known as:  TYLENOL Take 500 mg by mouth every 6 (six) hours as needed for mild pain or headache. Max of 2000 mg/day   albuterol 108 (90 Base) MCG/ACT inhaler Commonly known as:  PROAIR HFA Inhale 2 puffs into the lungs every 4 (four) hours as needed for wheezing or shortness of breath.   aspirin EC 81 MG tablet Take 81 mg by mouth daily.   atorvastatin 80 MG tablet Commonly known as:  LIPITOR TAKE 1 TABLET(80 MG) BY MOUTH DAILY   azelastine 0.1 % nasal spray Commonly known as:  ASTELIN Place 2 sprays into both nostrils 2 (two) times daily. Use in each nostril as directed   benzonatate 100 MG capsule Commonly known as:  TESSALON Take 1 capsule (100 mg total) by mouth 2 (two) times daily as needed for cough.   dextromethorphan-guaiFENesin 30-600 MG 12hr tablet Commonly known as:  MUCINEX DM Take 1 tablet by mouth 2 (two) times daily.   fluticasone 50 MCG/ACT nasal spray Commonly known as:  FLONASE Place 2 sprays into both nostrils 2 (two) times daily.   Fluticasone-Umeclidin-Vilant 100-62.5-25 MCG/INH Aepb Commonly known as:  TRELEGY ELLIPTA Inhale 1 puff daily into the lungs.   folic acid 800 MCG tablet Commonly known as:  FOLVITE Take 800 mcg by mouth daily.   furosemide 20 MG tablet Commonly known as:  LASIX Take 2 tablets (40 mg total) by mouth daily.   ipratropium-albuterol 0.5-2.5 (3) MG/3ML Soln Commonly known as:  DUONEB Take 3 mLs by nebulization every 4 (four) hours as  needed.   LOTEMAX 0.5 % Gel Generic drug:  Loteprednol Etabonate Place 1 drop into both eyes every morning. daily   Melatonin 1 MG Caps Take 4 mg by mouth as needed (Sleep).   nitroGLYCERIN 0.4 MG SL tablet Commonly known as:  NITROSTAT Place 1 tablet (0.4 mg total) under the tongue every 5 (five) minutes as needed for chest pain (up to 3 doses for severe chest pain. If no relief after 3rd dose, proceed to the ED for an evaluation).   oxyCODONE-acetaminophen 7.5-325 MG tablet Commonly known as:  PERCOCET Take 1 tablet by mouth 3 (three) times daily. 3 times a day   OXYGEN Inhale 3  L into the lungs continuous.   pantoprazole 40 MG tablet Commonly known as:  PROTONIX Take 1 tablet (40 mg total) by mouth 2 (two) times daily.   PERFECT IRON PO Take 1 tablet by mouth every morning.   potassium chloride 10 MEQ tablet Commonly known as:  K-DUR Take 4 tablets (40 mEq total) by mouth daily.   predniSONE 10 MG tablet Commonly known as:  DELTASONE Take 3 tablets (30 mg total) by mouth daily with breakfast. Take 30mg  daily until your FU with Rheumatologist Next week   pregabalin 75 MG capsule Commonly known as:  LYRICA Take 75 mg by mouth 2 (two) times daily.   PRESCRIPTION MEDICATION Apply 1 application topically as needed (psoriasis on scalp).   PRESCRIPTION MEDICATION Apply 1 application topically as needed (psoriasis on face).   PRESCRIPTION MEDICATION Apply 1 application topically as needed (psoriasis on body).   sodium chloride 0.65 % Soln nasal spray Commonly known as:  OCEAN Place 1 spray into both nostrils as needed for congestion.   SUMAtriptan 50 MG tablet Commonly known as:  IMITREX Take 1 tablet (50 mg total) by mouth daily as needed for migraine. May repeat in 2 hours if headache persists or recurs.   SUPER B COMPLEX PO Take 1 tablet by mouth daily.   vitamin C 500 MG tablet Commonly known as:  ASCORBIC ACID Take 500 mg by mouth daily.   VITEYES AREDS  ADVANCED PO Take 1 tablet by mouth daily.          Objective:   Physical Exam BP 132/74 (BP Location: Right Arm, Patient Position: Sitting, Cuff Size: Small)   Pulse 76   Temp 97.6 F (36.4 C) (Oral)   Resp 14   Ht 6' (1.829 m)   Wt 205 lb 8 oz (93.2 kg)   SpO2 93%   BMI 27.87 kg/m  General:   Well developed, well nourished . NAD.  HEENT:  Normocephalic . Face symmetric, atraumatic.  Nose congested Lungs:  Clear to auscultation Normal respiratory effort, no intercostal retractions, no accessory muscle use. Heart: Regular with occasional irregular heartbeat,  no murmur.  no pretibial edema bilaterally  Abdomen:  Not distended, soft, non-tender.   Skin: Not pale. Not jaundice Neurologic:  alert & oriented X3.  Speech normal, gait not tested  psych--  Cognition and judgment appear intact.  Cooperative with normal attention span and concentration.  Behavior appropriate. No anxious or depressed appearing.    Assessment & Plan:  Assessment  MSK: --RA --- Dr --Psoriasis --Osteoporosis Peripheral neuropathy, severe, several feet surgeries DM HTN Hyperlipidemia Pulmonary: --COPD --Pulmonary fibrosis --O2 24/ 7 --Chronic respiratory failure CV: --CAD, CHF, MI and  CABG 2007 --CP- CT chest no PE, cath 01-23-16, one occluded graft, rx medical mngmt  dx: question of pericarditis  --Pericarditis, admitted 01/2016 --New  onset A. Flutter 09-2016, admitted. Ablation 4-18, 12-2016: d/c OAC, rx asa 81; restart OAC if flutter return GI --Barrettts --GERD --esophageal dysmotility --Fatty liver Hoarseness , chronic, saw ENT , s/p botox 2015 , helped tempoarrily  Has a Hospital bed   PLAN SIRS due to coronavirus: Admitted to the hospital, sick due to virus, blood cultures negative, antibiotics were stopped early during the admission, MTX was held due to leukopenia.  Currently doing well, no fever chills, difficulty breathing decreasing.  Check a BMP and  CBC Rheumatoid arthritis: He is already contact rheumatology, see phone note from 09/12/2017, they decided to continue holding MTX, continue with prednisone taper, currently  15 mg daily, plan to drop the dose by 5 mg every week.. Dr.Rivadeneyra  planning to consult hematology for leukopenia.  Next appointment with him is 10/10/2017. Leukopenia: As above DM: Currently on Lantus 5 units daily, CBGs around 100  (on prednisone 15 mg daily) plan: Stop Lantus, monitor CBGs.  See AVS. COPD: Status post recent exacerbation, getting better, still has some cough at night, on multiple inhalers, has a nebulizer which he feels is helping significantly.  Tessalon Perles help with cough.  No change. RTC 3 months

## 2017-09-17 NOTE — Progress Notes (Signed)
Pre visit review using our clinic review tool, if applicable. No additional management support is needed unless otherwise documented below in the visit note. 

## 2017-09-18 LAB — BASIC METABOLIC PANEL
BUN: 19 mg/dL (ref 6–23)
CALCIUM: 9 mg/dL (ref 8.4–10.5)
CHLORIDE: 102 meq/L (ref 96–112)
CO2: 27 mEq/L (ref 19–32)
Creatinine, Ser: 0.64 mg/dL (ref 0.40–1.50)
GFR: 133.14 mL/min (ref >60.00)
Glucose, Bld: 176 mg/dL — ABNORMAL HIGH (ref 70–99)
POTASSIUM: 4.1 meq/L (ref 3.5–5.1)
SODIUM: 138 meq/L (ref 135–145)

## 2017-09-18 NOTE — Assessment & Plan Note (Signed)
SIRS due to coronavirus: Admitted to the hospital, sick due to virus, blood cultures negative, antibiotics were stopped early during the admission, MTX was held due to leukopenia.  Currently doing well, no fever chills, difficulty breathing decreasing.  Check a BMP and CBC Rheumatoid arthritis: He is already contact rheumatology, see phone note from 09/12/2017, they decided to continue holding MTX, continue with prednisone taper, currently 15 mg daily, plan to drop the dose by 5 mg every week.. Dr.Rivadeneyra  planning to consult hematology for leukopenia.  Next appointment with him is 10/10/2017. Leukopenia: As above DM: Currently on Lantus 5 units daily, CBGs around 100  (on prednisone 15 mg daily) plan: Stop Lantus, monitor CBGs.  See AVS. COPD: Status post recent exacerbation, getting better, still has some cough at night, on multiple inhalers, has a nebulizer which he feels is helping significantly.  Tessalon Perles help with cough.  No change. RTC 3 months

## 2017-09-19 ENCOUNTER — Telehealth: Payer: Self-pay | Admitting: Cardiology

## 2017-09-19 ENCOUNTER — Telehealth: Payer: Self-pay | Admitting: *Deleted

## 2017-09-19 DIAGNOSIS — M059 Rheumatoid arthritis with rheumatoid factor, unspecified: Secondary | ICD-10-CM | POA: Diagnosis not present

## 2017-09-19 DIAGNOSIS — Z79899 Other long term (current) drug therapy: Secondary | ICD-10-CM

## 2017-09-19 NOTE — Telephone Encounter (Signed)
Plan of Care signed and mailed back to [facility] with envelope provided. Copy of forms sent for scanning.

## 2017-09-19 NOTE — Telephone Encounter (Signed)
LMTCB

## 2017-09-19 NOTE — Telephone Encounter (Signed)
° °  Pt c/o medication issue:  1. Name of Medication: furosemide (LASIX) 20 MG tablet  2. How are you currently taking this medication (dosage and times per day)? Take 2 tablets (40 mg total) by mouth daily.  3. Are you having a reaction (difficulty breathing--STAT)? no 4. What is your medication issue? The hospitalist changed his medication dosage and pt want rn to go over his medication

## 2017-09-19 NOTE — Telephone Encounter (Signed)
Received Physician Orders from [facility]; forwarded to provider/SLS 01/25

## 2017-09-20 DIAGNOSIS — E1143 Type 2 diabetes mellitus with diabetic autonomic (poly)neuropathy: Secondary | ICD-10-CM | POA: Diagnosis not present

## 2017-09-20 DIAGNOSIS — I4892 Unspecified atrial flutter: Secondary | ICD-10-CM | POA: Diagnosis not present

## 2017-09-20 DIAGNOSIS — J439 Emphysema, unspecified: Secondary | ICD-10-CM | POA: Diagnosis not present

## 2017-09-20 DIAGNOSIS — I2510 Atherosclerotic heart disease of native coronary artery without angina pectoris: Secondary | ICD-10-CM | POA: Diagnosis not present

## 2017-09-20 DIAGNOSIS — J9611 Chronic respiratory failure with hypoxia: Secondary | ICD-10-CM | POA: Diagnosis not present

## 2017-09-20 DIAGNOSIS — E1142 Type 2 diabetes mellitus with diabetic polyneuropathy: Secondary | ICD-10-CM | POA: Diagnosis not present

## 2017-09-20 DIAGNOSIS — L409 Psoriasis, unspecified: Secondary | ICD-10-CM | POA: Diagnosis not present

## 2017-09-20 DIAGNOSIS — I110 Hypertensive heart disease with heart failure: Secondary | ICD-10-CM | POA: Diagnosis not present

## 2017-09-20 DIAGNOSIS — M810 Age-related osteoporosis without current pathological fracture: Secondary | ICD-10-CM | POA: Diagnosis not present

## 2017-09-20 DIAGNOSIS — M069 Rheumatoid arthritis, unspecified: Secondary | ICD-10-CM | POA: Diagnosis not present

## 2017-09-22 NOTE — Telephone Encounter (Signed)
Returned the call to the patient. He stated that when he was in the hospital with septic shock that they reduced his potassium to 40 meq daily and Furosemide to 40 mg daily. He stated that he has been having increasing lower extremity edema and wonders if he should be back on what he was prior to his hospital visit. He was on 60 meq of potassium and 60 mg of furosemide. He denies shortness of breath. He stated that his edema was well controlled with the previous dose. Message routed to the provider for his input.

## 2017-09-22 NOTE — Addendum Note (Signed)
Addended by: Lurline Hare on: 09/22/2017 10:11 AM   Modules accepted: Level of Service, SmartSet

## 2017-09-22 NOTE — Telephone Encounter (Signed)
Mr.Colao is returning a call . Thanks

## 2017-09-22 NOTE — Telephone Encounter (Signed)
Please call,pt said he did not get the message that was left for him on Friday.He needs to know what to do about the medicine please.

## 2017-09-22 NOTE — Telephone Encounter (Signed)
LMTCB

## 2017-09-22 NOTE — Telephone Encounter (Signed)
This encounter was created in error - please disregard.

## 2017-09-23 DIAGNOSIS — I4892 Unspecified atrial flutter: Secondary | ICD-10-CM | POA: Diagnosis not present

## 2017-09-23 DIAGNOSIS — I110 Hypertensive heart disease with heart failure: Secondary | ICD-10-CM | POA: Diagnosis not present

## 2017-09-23 DIAGNOSIS — J439 Emphysema, unspecified: Secondary | ICD-10-CM | POA: Diagnosis not present

## 2017-09-23 DIAGNOSIS — E1143 Type 2 diabetes mellitus with diabetic autonomic (poly)neuropathy: Secondary | ICD-10-CM | POA: Diagnosis not present

## 2017-09-23 DIAGNOSIS — I2510 Atherosclerotic heart disease of native coronary artery without angina pectoris: Secondary | ICD-10-CM | POA: Diagnosis not present

## 2017-09-23 DIAGNOSIS — J9611 Chronic respiratory failure with hypoxia: Secondary | ICD-10-CM | POA: Diagnosis not present

## 2017-09-23 DIAGNOSIS — L409 Psoriasis, unspecified: Secondary | ICD-10-CM | POA: Diagnosis not present

## 2017-09-23 DIAGNOSIS — E1142 Type 2 diabetes mellitus with diabetic polyneuropathy: Secondary | ICD-10-CM | POA: Diagnosis not present

## 2017-09-23 DIAGNOSIS — M810 Age-related osteoporosis without current pathological fracture: Secondary | ICD-10-CM | POA: Diagnosis not present

## 2017-09-23 DIAGNOSIS — M069 Rheumatoid arthritis, unspecified: Secondary | ICD-10-CM | POA: Diagnosis not present

## 2017-09-23 MED ORDER — FUROSEMIDE 20 MG PO TABS: 60.0000 mg | ORAL_TABLET | Freq: Every day | ORAL | 3 refills | Status: DC

## 2017-09-23 MED ORDER — POTASSIUM CHLORIDE ER 10 MEQ PO TBCR: 60.0000 meq | EXTENDED_RELEASE_TABLET | Freq: Every day | ORAL | 9 refills | Status: DC

## 2017-09-23 NOTE — Telephone Encounter (Signed)
Missed PT visit alert signed and faxed to [facility] at [phone]. Form sent for scanning.

## 2017-09-23 NOTE — Telephone Encounter (Signed)
Patient has been made aware of the increase to the potassium and furosemide. He will have a BMET lab drawn on Monday. Orders have been placed.

## 2017-09-23 NOTE — Telephone Encounter (Signed)
OK to resume previous dose at 60 lasix and 60 potassium with BMET in 5 days.

## 2017-09-25 DIAGNOSIS — I4892 Unspecified atrial flutter: Secondary | ICD-10-CM | POA: Diagnosis not present

## 2017-09-25 DIAGNOSIS — I2510 Atherosclerotic heart disease of native coronary artery without angina pectoris: Secondary | ICD-10-CM | POA: Diagnosis not present

## 2017-09-25 DIAGNOSIS — E1143 Type 2 diabetes mellitus with diabetic autonomic (poly)neuropathy: Secondary | ICD-10-CM | POA: Diagnosis not present

## 2017-09-25 DIAGNOSIS — I110 Hypertensive heart disease with heart failure: Secondary | ICD-10-CM | POA: Diagnosis not present

## 2017-09-25 DIAGNOSIS — J9611 Chronic respiratory failure with hypoxia: Secondary | ICD-10-CM | POA: Diagnosis not present

## 2017-09-25 DIAGNOSIS — M810 Age-related osteoporosis without current pathological fracture: Secondary | ICD-10-CM | POA: Diagnosis not present

## 2017-09-25 DIAGNOSIS — M069 Rheumatoid arthritis, unspecified: Secondary | ICD-10-CM | POA: Diagnosis not present

## 2017-09-25 DIAGNOSIS — J439 Emphysema, unspecified: Secondary | ICD-10-CM | POA: Diagnosis not present

## 2017-09-25 DIAGNOSIS — L409 Psoriasis, unspecified: Secondary | ICD-10-CM | POA: Diagnosis not present

## 2017-09-25 DIAGNOSIS — E1142 Type 2 diabetes mellitus with diabetic polyneuropathy: Secondary | ICD-10-CM | POA: Diagnosis not present

## 2017-09-26 ENCOUNTER — Telehealth: Payer: Self-pay | Admitting: *Deleted

## 2017-09-26 DIAGNOSIS — M2041 Other hammer toe(s) (acquired), right foot: Secondary | ICD-10-CM | POA: Diagnosis not present

## 2017-09-26 DIAGNOSIS — L97512 Non-pressure chronic ulcer of other part of right foot with fat layer exposed: Secondary | ICD-10-CM | POA: Diagnosis not present

## 2017-09-26 DIAGNOSIS — M0579 Rheumatoid arthritis with rheumatoid factor of multiple sites without organ or systems involvement: Secondary | ICD-10-CM | POA: Diagnosis not present

## 2017-09-26 DIAGNOSIS — E1142 Type 2 diabetes mellitus with diabetic polyneuropathy: Secondary | ICD-10-CM | POA: Diagnosis not present

## 2017-09-26 DIAGNOSIS — E13621 Other specified diabetes mellitus with foot ulcer: Secondary | ICD-10-CM | POA: Diagnosis not present

## 2017-09-26 NOTE — Telephone Encounter (Signed)
Received Home Health Certification and Plan of Care via mail; forwarded to provider/SLS 02/01 Received Home Health Plan of Care Update via mail; forwarded to provider/SLS 02/01

## 2017-09-29 DIAGNOSIS — I110 Hypertensive heart disease with heart failure: Secondary | ICD-10-CM | POA: Diagnosis not present

## 2017-09-29 DIAGNOSIS — E1142 Type 2 diabetes mellitus with diabetic polyneuropathy: Secondary | ICD-10-CM | POA: Diagnosis not present

## 2017-09-29 DIAGNOSIS — M069 Rheumatoid arthritis, unspecified: Secondary | ICD-10-CM | POA: Diagnosis not present

## 2017-09-29 DIAGNOSIS — G4700 Insomnia, unspecified: Secondary | ICD-10-CM | POA: Diagnosis not present

## 2017-09-29 DIAGNOSIS — G894 Chronic pain syndrome: Secondary | ICD-10-CM | POA: Diagnosis not present

## 2017-09-29 NOTE — Telephone Encounter (Signed)
Plan of Care's signed and mailed to [facility] in envelopes provided. Copies of forms sent for scanning.

## 2017-09-30 DIAGNOSIS — M810 Age-related osteoporosis without current pathological fracture: Secondary | ICD-10-CM | POA: Diagnosis not present

## 2017-09-30 DIAGNOSIS — M069 Rheumatoid arthritis, unspecified: Secondary | ICD-10-CM | POA: Diagnosis not present

## 2017-09-30 DIAGNOSIS — J9611 Chronic respiratory failure with hypoxia: Secondary | ICD-10-CM | POA: Diagnosis not present

## 2017-09-30 DIAGNOSIS — E1142 Type 2 diabetes mellitus with diabetic polyneuropathy: Secondary | ICD-10-CM | POA: Diagnosis not present

## 2017-09-30 DIAGNOSIS — M059 Rheumatoid arthritis with rheumatoid factor, unspecified: Secondary | ICD-10-CM | POA: Diagnosis not present

## 2017-09-30 DIAGNOSIS — I110 Hypertensive heart disease with heart failure: Secondary | ICD-10-CM | POA: Diagnosis not present

## 2017-09-30 DIAGNOSIS — I2510 Atherosclerotic heart disease of native coronary artery without angina pectoris: Secondary | ICD-10-CM | POA: Diagnosis not present

## 2017-09-30 DIAGNOSIS — E1143 Type 2 diabetes mellitus with diabetic autonomic (poly)neuropathy: Secondary | ICD-10-CM | POA: Diagnosis not present

## 2017-09-30 DIAGNOSIS — I4892 Unspecified atrial flutter: Secondary | ICD-10-CM | POA: Diagnosis not present

## 2017-09-30 DIAGNOSIS — Z79899 Other long term (current) drug therapy: Secondary | ICD-10-CM | POA: Diagnosis not present

## 2017-09-30 DIAGNOSIS — L409 Psoriasis, unspecified: Secondary | ICD-10-CM | POA: Diagnosis not present

## 2017-09-30 DIAGNOSIS — J439 Emphysema, unspecified: Secondary | ICD-10-CM | POA: Diagnosis not present

## 2017-10-01 DIAGNOSIS — M810 Age-related osteoporosis without current pathological fracture: Secondary | ICD-10-CM | POA: Diagnosis not present

## 2017-10-01 DIAGNOSIS — I110 Hypertensive heart disease with heart failure: Secondary | ICD-10-CM | POA: Diagnosis not present

## 2017-10-01 DIAGNOSIS — L409 Psoriasis, unspecified: Secondary | ICD-10-CM | POA: Diagnosis not present

## 2017-10-01 DIAGNOSIS — I2510 Atherosclerotic heart disease of native coronary artery without angina pectoris: Secondary | ICD-10-CM | POA: Diagnosis not present

## 2017-10-01 DIAGNOSIS — M069 Rheumatoid arthritis, unspecified: Secondary | ICD-10-CM | POA: Diagnosis not present

## 2017-10-01 DIAGNOSIS — I4892 Unspecified atrial flutter: Secondary | ICD-10-CM | POA: Diagnosis not present

## 2017-10-01 DIAGNOSIS — J9611 Chronic respiratory failure with hypoxia: Secondary | ICD-10-CM | POA: Diagnosis not present

## 2017-10-01 DIAGNOSIS — E1142 Type 2 diabetes mellitus with diabetic polyneuropathy: Secondary | ICD-10-CM | POA: Diagnosis not present

## 2017-10-01 DIAGNOSIS — J439 Emphysema, unspecified: Secondary | ICD-10-CM | POA: Diagnosis not present

## 2017-10-01 DIAGNOSIS — E1143 Type 2 diabetes mellitus with diabetic autonomic (poly)neuropathy: Secondary | ICD-10-CM | POA: Diagnosis not present

## 2017-10-01 LAB — BASIC METABOLIC PANEL
BUN/Creatinine Ratio: 20 (ref 10–24)
BUN: 14 mg/dL (ref 8–27)
CALCIUM: 9.4 mg/dL (ref 8.6–10.2)
CO2: 20 mmol/L (ref 20–29)
Chloride: 102 mmol/L (ref 96–106)
Creatinine, Ser: 0.71 mg/dL — ABNORMAL LOW (ref 0.76–1.27)
GFR calc Af Amer: 114 mL/min/{1.73_m2} (ref >59)
GFR, EST NON AFRICAN AMERICAN: 98 mL/min/{1.73_m2} (ref >59)
Glucose: 216 mg/dL — ABNORMAL HIGH (ref 65–99)
Potassium: 4.4 mmol/L (ref 3.5–5.2)
Sodium: 141 mmol/L (ref 134–144)

## 2017-10-02 DIAGNOSIS — L409 Psoriasis, unspecified: Secondary | ICD-10-CM | POA: Diagnosis not present

## 2017-10-02 DIAGNOSIS — E1143 Type 2 diabetes mellitus with diabetic autonomic (poly)neuropathy: Secondary | ICD-10-CM | POA: Diagnosis not present

## 2017-10-02 DIAGNOSIS — I110 Hypertensive heart disease with heart failure: Secondary | ICD-10-CM | POA: Diagnosis not present

## 2017-10-02 DIAGNOSIS — M069 Rheumatoid arthritis, unspecified: Secondary | ICD-10-CM | POA: Diagnosis not present

## 2017-10-02 DIAGNOSIS — I4892 Unspecified atrial flutter: Secondary | ICD-10-CM | POA: Diagnosis not present

## 2017-10-02 DIAGNOSIS — I2510 Atherosclerotic heart disease of native coronary artery without angina pectoris: Secondary | ICD-10-CM | POA: Diagnosis not present

## 2017-10-02 DIAGNOSIS — E1142 Type 2 diabetes mellitus with diabetic polyneuropathy: Secondary | ICD-10-CM | POA: Diagnosis not present

## 2017-10-02 DIAGNOSIS — J439 Emphysema, unspecified: Secondary | ICD-10-CM | POA: Diagnosis not present

## 2017-10-02 DIAGNOSIS — M810 Age-related osteoporosis without current pathological fracture: Secondary | ICD-10-CM | POA: Diagnosis not present

## 2017-10-02 DIAGNOSIS — J9611 Chronic respiratory failure with hypoxia: Secondary | ICD-10-CM | POA: Diagnosis not present

## 2017-10-03 DIAGNOSIS — L409 Psoriasis, unspecified: Secondary | ICD-10-CM | POA: Diagnosis not present

## 2017-10-03 DIAGNOSIS — I4892 Unspecified atrial flutter: Secondary | ICD-10-CM | POA: Diagnosis not present

## 2017-10-03 DIAGNOSIS — M810 Age-related osteoporosis without current pathological fracture: Secondary | ICD-10-CM | POA: Diagnosis not present

## 2017-10-03 DIAGNOSIS — J439 Emphysema, unspecified: Secondary | ICD-10-CM | POA: Diagnosis not present

## 2017-10-03 DIAGNOSIS — E1143 Type 2 diabetes mellitus with diabetic autonomic (poly)neuropathy: Secondary | ICD-10-CM | POA: Diagnosis not present

## 2017-10-03 DIAGNOSIS — E1142 Type 2 diabetes mellitus with diabetic polyneuropathy: Secondary | ICD-10-CM | POA: Diagnosis not present

## 2017-10-03 DIAGNOSIS — I2510 Atherosclerotic heart disease of native coronary artery without angina pectoris: Secondary | ICD-10-CM | POA: Diagnosis not present

## 2017-10-03 DIAGNOSIS — M069 Rheumatoid arthritis, unspecified: Secondary | ICD-10-CM | POA: Diagnosis not present

## 2017-10-03 DIAGNOSIS — J9611 Chronic respiratory failure with hypoxia: Secondary | ICD-10-CM | POA: Diagnosis not present

## 2017-10-03 DIAGNOSIS — I110 Hypertensive heart disease with heart failure: Secondary | ICD-10-CM | POA: Diagnosis not present

## 2017-10-07 DIAGNOSIS — M810 Age-related osteoporosis without current pathological fracture: Secondary | ICD-10-CM | POA: Diagnosis not present

## 2017-10-07 DIAGNOSIS — I4892 Unspecified atrial flutter: Secondary | ICD-10-CM | POA: Diagnosis not present

## 2017-10-07 DIAGNOSIS — I2510 Atherosclerotic heart disease of native coronary artery without angina pectoris: Secondary | ICD-10-CM | POA: Diagnosis not present

## 2017-10-07 DIAGNOSIS — E1142 Type 2 diabetes mellitus with diabetic polyneuropathy: Secondary | ICD-10-CM | POA: Diagnosis not present

## 2017-10-07 DIAGNOSIS — J439 Emphysema, unspecified: Secondary | ICD-10-CM | POA: Diagnosis not present

## 2017-10-07 DIAGNOSIS — J9611 Chronic respiratory failure with hypoxia: Secondary | ICD-10-CM | POA: Diagnosis not present

## 2017-10-07 DIAGNOSIS — L409 Psoriasis, unspecified: Secondary | ICD-10-CM | POA: Diagnosis not present

## 2017-10-07 DIAGNOSIS — E1143 Type 2 diabetes mellitus with diabetic autonomic (poly)neuropathy: Secondary | ICD-10-CM | POA: Diagnosis not present

## 2017-10-07 DIAGNOSIS — I110 Hypertensive heart disease with heart failure: Secondary | ICD-10-CM | POA: Diagnosis not present

## 2017-10-07 DIAGNOSIS — M069 Rheumatoid arthritis, unspecified: Secondary | ICD-10-CM | POA: Diagnosis not present

## 2017-10-08 DIAGNOSIS — E1142 Type 2 diabetes mellitus with diabetic polyneuropathy: Secondary | ICD-10-CM | POA: Diagnosis not present

## 2017-10-08 DIAGNOSIS — M0579 Rheumatoid arthritis with rheumatoid factor of multiple sites without organ or systems involvement: Secondary | ICD-10-CM | POA: Diagnosis not present

## 2017-10-08 DIAGNOSIS — M2041 Other hammer toe(s) (acquired), right foot: Secondary | ICD-10-CM | POA: Diagnosis not present

## 2017-10-10 ENCOUNTER — Ambulatory Visit: Admit: 2017-10-10 | Discharge: 2017-10-11 | Payer: MEDICARE | Attending: Rheumatology | Primary: Rheumatology

## 2017-10-10 DIAGNOSIS — M069 Rheumatoid arthritis, unspecified: Secondary | ICD-10-CM

## 2017-10-10 DIAGNOSIS — M05741 Rheumatoid arthritis with rheumatoid factor of right hand without organ or systems involvement: Secondary | ICD-10-CM

## 2017-10-10 DIAGNOSIS — D708 Other neutropenia: Principal | ICD-10-CM

## 2017-10-10 DIAGNOSIS — J438 Other emphysema: Secondary | ICD-10-CM

## 2017-10-10 DIAGNOSIS — M05742 Rheumatoid arthritis with rheumatoid factor of left hand without organ or systems involvement: Secondary | ICD-10-CM

## 2017-10-10 DIAGNOSIS — M059 Rheumatoid arthritis with rheumatoid factor, unspecified: Secondary | ICD-10-CM

## 2017-10-10 DIAGNOSIS — Z6828 Body mass index (BMI) 28.0-28.9, adult: Secondary | ICD-10-CM | POA: Diagnosis not present

## 2017-10-10 DIAGNOSIS — M0550 Rheumatoid polyneuropathy with rheumatoid arthritis of unspecified site: Secondary | ICD-10-CM | POA: Diagnosis not present

## 2017-10-10 DIAGNOSIS — D696 Thrombocytopenia, unspecified: Secondary | ICD-10-CM | POA: Diagnosis not present

## 2017-10-10 DIAGNOSIS — Z7952 Long term (current) use of systemic steroids: Secondary | ICD-10-CM | POA: Diagnosis not present

## 2017-10-10 DIAGNOSIS — M159 Polyosteoarthritis, unspecified: Secondary | ICD-10-CM | POA: Diagnosis not present

## 2017-10-10 DIAGNOSIS — M3500 Sicca syndrome, unspecified: Secondary | ICD-10-CM | POA: Diagnosis not present

## 2017-10-10 DIAGNOSIS — D709 Neutropenia, unspecified: Secondary | ICD-10-CM | POA: Diagnosis not present

## 2017-10-10 DIAGNOSIS — Z9981 Dependence on supplemental oxygen: Secondary | ICD-10-CM | POA: Diagnosis not present

## 2017-10-10 MED ORDER — PREDNISONE 5 MG TABLET
ORAL_TABLET | 6 refills | 0 days | Status: CP
Start: 2017-10-10 — End: 2017-11-28

## 2017-10-10 MED ORDER — PREDNISONE 10 MG TABLET
ORAL_TABLET | 5 refills | 0 days | Status: CP
Start: 2017-10-10 — End: 2017-11-28

## 2017-10-13 ENCOUNTER — Other Ambulatory Visit: Payer: Self-pay | Admitting: Internal Medicine

## 2017-10-13 NOTE — Telephone Encounter (Signed)
Patient called to clarify the request for test strips, he says "the pharmacy said to get a prescription for "GENERIC TEST STRIPS AND LANCETS" for insurance purposes. Then whatever the insurance will pay for when submitted, the pharmacy will go ahead and fill. Can it be sent to [facility] today?" I advised this would be sent to the provider today, he verbalized understanding.

## 2017-10-13 NOTE — Telephone Encounter (Signed)
Copied from CRM [id number]. Topic: Quick Communication - Rx Refill/Question >> Oct 13, 2017  3:26 PM Oneal Grout wrote: Medication: Generic test strips and lancets   Has the patient contacted their pharmacy? Yes.     (Agent: If no, request that the patient contact the pharmacy for the refill.)   Preferred Pharmacy (with phone number or street name): [facility]   Agent: Please be advised that RX refills may take up to 3 business days. We ask that you follow-up with your pharmacy.  Patient is out completely. Asked what type of meter, patient response that Pharmacy told his to ask for generic, did not have a name.

## 2017-10-14 ENCOUNTER — Encounter: Payer: Self-pay | Admitting: Pulmonary Disease

## 2017-10-14 ENCOUNTER — Ambulatory Visit: Payer: Medicare HMO | Admitting: Pulmonary Disease

## 2017-10-14 VITALS — BP 108/68 | HR 91 | Ht 74.0 in | Wt 209.0 lb

## 2017-10-14 DIAGNOSIS — J9611 Chronic respiratory failure with hypoxia: Secondary | ICD-10-CM

## 2017-10-14 DIAGNOSIS — I110 Hypertensive heart disease with heart failure: Secondary | ICD-10-CM | POA: Diagnosis not present

## 2017-10-14 DIAGNOSIS — I2510 Atherosclerotic heart disease of native coronary artery without angina pectoris: Secondary | ICD-10-CM | POA: Diagnosis not present

## 2017-10-14 DIAGNOSIS — L409 Psoriasis, unspecified: Secondary | ICD-10-CM | POA: Diagnosis not present

## 2017-10-14 DIAGNOSIS — Z7189 Other specified counseling: Secondary | ICD-10-CM | POA: Diagnosis not present

## 2017-10-14 DIAGNOSIS — J479 Bronchiectasis, uncomplicated: Secondary | ICD-10-CM

## 2017-10-14 DIAGNOSIS — M069 Rheumatoid arthritis, unspecified: Secondary | ICD-10-CM | POA: Diagnosis not present

## 2017-10-14 DIAGNOSIS — I4892 Unspecified atrial flutter: Secondary | ICD-10-CM | POA: Diagnosis not present

## 2017-10-14 DIAGNOSIS — E1143 Type 2 diabetes mellitus with diabetic autonomic (poly)neuropathy: Secondary | ICD-10-CM | POA: Diagnosis not present

## 2017-10-14 DIAGNOSIS — J189 Pneumonia, unspecified organism: Secondary | ICD-10-CM | POA: Diagnosis not present

## 2017-10-14 DIAGNOSIS — J439 Emphysema, unspecified: Secondary | ICD-10-CM

## 2017-10-14 DIAGNOSIS — M059 Rheumatoid arthritis with rheumatoid factor, unspecified: Secondary | ICD-10-CM | POA: Diagnosis not present

## 2017-10-14 DIAGNOSIS — M810 Age-related osteoporosis without current pathological fracture: Secondary | ICD-10-CM | POA: Diagnosis not present

## 2017-10-14 DIAGNOSIS — E1142 Type 2 diabetes mellitus with diabetic polyneuropathy: Secondary | ICD-10-CM | POA: Diagnosis not present

## 2017-10-14 NOTE — Patient Instructions (Signed)
Follow-up in 6 weeks

## 2017-10-14 NOTE — Progress Notes (Signed)
[facility]  Chief Complaint  Patient presents with  . Follow-up    Pt is on 3 liters O2 con't, doing better since last OV. Pt requests to look into Dr. Jerre Simon MD labs in last 3 months, and cardiologist,  Dr. Drue Novel MD to look over results on pt per his request    Vital signs: BP 108/68 (BP Location: Left Arm, Cuff Size: Normal)   Pulse 91   Ht 6\' 2"  (1.88 m)   Wt 209 lb (94.8 kg)   SpO2 99%   BMI 26.83 kg/m   History of Present Illness: Joseph [name] is a 67 y.o. male former smoker with COPD/emphysema, rheumatoid arthritis, recurrent pneumonia 2nd to chronic aspiration/reflux, bronchiectasis, chronic respiratory failure on 3 liters oxygen 24/7.  He is slowly recovering after he recent hospitalizations.  He is no longer on MTX.  He is scheduled for hematology evaluation at [facility] to assess leukopenia.    He is not having as much cough.  His sputum is now clear.  He denies fever, chest pain, or hemoptysis.  His wife has been doing chest PT and this has helped.  He uses 3 liters oxygen.  He is on 15 mg prednisone daily and takes percocet to help with his pain.  He has a better understanding about what is involved with mechanical ventilation.  He will be moving to the [facility] area in April.  Physical Exam:  General - pleasant, wearing oxygen Eyes - pupils reactive ENT - no sinus tenderness, no oral exudate, no LAN Cardiac - regular, no murmur Chest - no wheeze, rales Abd - soft, non tender Ext - extensive changes from RA Skin - no rashes Neuro - normal strength Psych - normal mood   Assessment/Plan:  COPD with bullous emphysema. - continue trelegy and prn albuterol  Bronchiectasis. - continue bronchial hygiene  Recurrent aspiration PNA with hx of GERD, HH, and gastroparesis. - continue protonix per GI  Chronic respiratory failure. - Related to COPD/Emphysema. - continue 3 liters oxygen 24/7  Hx of Rheumatoid  arthritis. - he is followed by Dr. Olean Ree at [facility] - he is on chronic prednisone - MTX stopped due to recurrent infections  Leukopenia. - he has hematology assessment at [facility] scheduled  Goals of care. - had extensive discussion - he would be okay with short term trial of intubation in the future if needed - he would not want trach or long term vent support   Patient Instructions  Follow up in 6 weeks  Time spent 29 minutes.  [name], MD [facility] 10/14/2017, 2:31 PM Pager:  [phone]  Flow Sheet  Pulmonary tests PFT 01/12/09 >> FEV1 3.02 (85%), FEV1% 68, TLC 6.35 (88%), DLCO 45%, no BD PFT 12/02/12 >> FEV1 3.11 (78%), FEV1% 77, TLC 6.32 (83%), DLCO 37%, no BD  CT chests CT chest 10/17/12 >> severe centrilobular emphysema, apical bullae, 1 cm nodule LUL new, 5 mm nodule RLL stable since 2009, BTX with GGO at bases CT chest 05/06/13 >> decreased size LUL nodule now 8 mm, 4 mm nodule RLL, 3 mm nodule RML, 7 mm nodule RML, 8 mm nodule lingula CT chest 04/05/14 >> atherosclerosis, moderate diffuse centrilobular and paraseptal emphysema, stable pulmonary nodules with dominant LUL nodule now 7 mm, steatosis of liver with ?early cirrhosis CT chest 05/02/15 >> stable pulmonary nodules since 2009, severe emphysema CT angio chest 01/19/16 >> bullous emphysema CT angio chest 02/23/16 >> new patchy ASD Rt >  Lt lower lobes  Cardiac tests Echo 08/15/17 >> EF 55 to 60%, grade 1 DD, PAS 35 mmHg  Past Medical History: He  has a past medical history of CAD (coronary artery disease), Cataracts, bilateral, Complication of anesthesia, COPD (chronic obstructive pulmonary disease) (HCC), Diabetes mellitus, Esophageal dysmotility, Fatty liver (10/11/09), Gastroesophageal reflux disease, Gastroparesis, Hiatal hernia, Hypertension, Neuropathy, Onychomycosis, Osteoporosis, Oxygen dependent, Peripheral polyneuropathy, Psoriasis, Pulmonary nodules, and Rheumatoid arthritis  (HCC).  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  Family History: His family history includes Diverticulitis in his mother; Heart attack in his father; Heart disease in his father and mother.  Social History: He  reports that he quit smoking about 12 years ago. He has a 40.00 pack-year smoking history. He quit smokeless tobacco use about 11 years ago. He reports that he does not drink alcohol or use drugs.  Medications: Allergies as of 10/14/2017   No Known Allergies     Medication List        Accurate as of 10/14/17  2:31 PM. Always use your most recent med list.          acetaminophen 500 MG tablet Commonly known as:  TYLENOL Take 500 mg by mouth every 6 (six) hours as needed for mild pain or headache. Max of 2000 mg/day   albuterol 108 (90 Base) MCG/ACT inhaler Commonly known as:  PROAIR HFA Inhale 2 puffs into the lungs every 4 (four) hours as needed for wheezing or shortness of breath.   aspirin EC 81 MG tablet Take 81 mg by mouth daily.   atorvastatin 80 MG tablet Commonly known as:  LIPITOR TAKE 1 TABLET(80 MG) BY MOUTH DAILY   benzonatate 100 MG capsule Commonly known as:  TESSALON Take 1 capsule (100 mg total) by mouth 2 (two) times daily as needed for cough.   dextromethorphan-guaiFENesin 30-600 MG 12hr tablet Commonly known as:  MUCINEX DM Take 1 tablet by mouth 2 (two) times daily.   fluticasone 50 MCG/ACT nasal spray Commonly known as:  FLONASE Place 2 sprays into both nostrils 2 (two) times daily.   Fluticasone-Umeclidin-Vilant 100-62.5-25  MCG/INH Aepb Commonly known as:  TRELEGY ELLIPTA Inhale 1 puff daily into the lungs.   folic acid 800 MCG tablet Commonly known as:  FOLVITE Take 800 mcg by mouth daily.   furosemide 20 MG tablet Commonly known as:  LASIX Take 3 tablets (60 mg total) by mouth daily.   ipratropium-albuterol 0.5-2.5 (3) MG/3ML Soln Commonly known as:  DUONEB Take 3 mLs by nebulization every 4 (four) hours as needed.   LOTEMAX 0.5 % Gel Generic drug:  Loteprednol Etabonate Place 1 drop into both eyes every morning. daily   Melatonin 1 MG Caps Take 4 mg by mouth as needed (Sleep).   nitroGLYCERIN 0.4 MG SL tablet Commonly known as:  NITROSTAT Place 1 tablet (0.4 mg total) under the tongue every 5 (five) minutes as needed for chest pain (up to 3 doses for severe chest pain. If no relief after 3rd dose, proceed to the ED for an evaluation).   oxyCODONE-acetaminophen 7.5-325 MG tablet Commonly known as:  PERCOCET Take 1 tablet by mouth 3 (three) times daily. 3 times a day   OXYGEN Inhale 3  L into the lungs continuous.   pantoprazole 40 MG tablet Commonly known as:  PROTONIX Take 1 tablet (40 mg total) by mouth 2 (two) times daily.   PERFECT IRON PO Take 1 tablet by mouth every morning.   potassium chloride 10 MEQ tablet Commonly known as:  K-DUR Take 6 tablets (60 mEq total) by mouth daily.   predniSONE 10 MG tablet Commonly known as:  DELTASONE Take 3 tablets (30 mg total) by mouth daily with breakfast. Take 30mg  daily until your FU with Rheumatologist Next week   pregabalin 75 MG capsule Commonly known as:  LYRICA Take 75 mg by mouth 2 (two) times daily.   PRESCRIPTION MEDICATION Apply 1 application topically as needed (psoriasis on scalp).   PRESCRIPTION MEDICATION Apply 1 application topically as needed (psoriasis on face).   PRESCRIPTION MEDICATION Apply 1 application topically as needed (psoriasis on body).   sodium chloride 0.65 % Soln nasal spray Commonly known as:   OCEAN Place 1 spray into both nostrils as needed for congestion.   SUMAtriptan 50 MG tablet Commonly known as:  IMITREX Take 1 tablet (50 mg total) by mouth daily as needed for migraine. May repeat in 2 hours if headache persists or recurs.   SUPER B COMPLEX PO Take 1 tablet by mouth daily.   vitamin C 500 MG tablet Commonly known as:  ASCORBIC ACID Take 500 mg by mouth daily.   VITEYES AREDS ADVANCED PO Take 1 tablet by mouth daily.

## 2017-10-16 ENCOUNTER — Telehealth: Payer: Self-pay | Admitting: Internal Medicine

## 2017-10-16 DIAGNOSIS — E1142 Type 2 diabetes mellitus with diabetic polyneuropathy: Secondary | ICD-10-CM | POA: Diagnosis not present

## 2017-10-16 DIAGNOSIS — I2510 Atherosclerotic heart disease of native coronary artery without angina pectoris: Secondary | ICD-10-CM | POA: Diagnosis not present

## 2017-10-16 DIAGNOSIS — I4892 Unspecified atrial flutter: Secondary | ICD-10-CM | POA: Diagnosis not present

## 2017-10-16 DIAGNOSIS — L409 Psoriasis, unspecified: Secondary | ICD-10-CM | POA: Diagnosis not present

## 2017-10-16 DIAGNOSIS — E1143 Type 2 diabetes mellitus with diabetic autonomic (poly)neuropathy: Secondary | ICD-10-CM | POA: Diagnosis not present

## 2017-10-16 DIAGNOSIS — J439 Emphysema, unspecified: Secondary | ICD-10-CM | POA: Diagnosis not present

## 2017-10-16 DIAGNOSIS — M069 Rheumatoid arthritis, unspecified: Secondary | ICD-10-CM | POA: Diagnosis not present

## 2017-10-16 DIAGNOSIS — J9611 Chronic respiratory failure with hypoxia: Secondary | ICD-10-CM | POA: Diagnosis not present

## 2017-10-16 DIAGNOSIS — I110 Hypertensive heart disease with heart failure: Secondary | ICD-10-CM | POA: Diagnosis not present

## 2017-10-16 DIAGNOSIS — M810 Age-related osteoporosis without current pathological fracture: Secondary | ICD-10-CM | POA: Diagnosis not present

## 2017-10-16 NOTE — Telephone Encounter (Signed)
noted 

## 2017-10-16 NOTE — Telephone Encounter (Signed)
Copied from CRM [id number]. Topic: Quick Communication - See Telephone Encounter >> Oct 16, 2017 10:01 AM Clack, Princella Pellegrini wrote: CRM for notification. See Telephone encounter for: Joseph [name] with [facility] called and wanted to let Dr. Drue Novel know the pt cancel his skilled nursing visit today.  [phone]  10/16/17.

## 2017-10-16 NOTE — Telephone Encounter (Signed)
Just an FYI

## 2017-10-17 DIAGNOSIS — J439 Emphysema, unspecified: Secondary | ICD-10-CM | POA: Diagnosis not present

## 2017-10-17 DIAGNOSIS — M069 Rheumatoid arthritis, unspecified: Secondary | ICD-10-CM | POA: Diagnosis not present

## 2017-10-17 DIAGNOSIS — J9611 Chronic respiratory failure with hypoxia: Secondary | ICD-10-CM | POA: Diagnosis not present

## 2017-10-17 DIAGNOSIS — I4892 Unspecified atrial flutter: Secondary | ICD-10-CM | POA: Diagnosis not present

## 2017-10-17 DIAGNOSIS — I2510 Atherosclerotic heart disease of native coronary artery without angina pectoris: Secondary | ICD-10-CM | POA: Diagnosis not present

## 2017-10-17 DIAGNOSIS — E1143 Type 2 diabetes mellitus with diabetic autonomic (poly)neuropathy: Secondary | ICD-10-CM | POA: Diagnosis not present

## 2017-10-17 DIAGNOSIS — E1142 Type 2 diabetes mellitus with diabetic polyneuropathy: Secondary | ICD-10-CM | POA: Diagnosis not present

## 2017-10-17 DIAGNOSIS — I110 Hypertensive heart disease with heart failure: Secondary | ICD-10-CM | POA: Diagnosis not present

## 2017-10-17 DIAGNOSIS — M810 Age-related osteoporosis without current pathological fracture: Secondary | ICD-10-CM | POA: Diagnosis not present

## 2017-10-17 DIAGNOSIS — L409 Psoriasis, unspecified: Secondary | ICD-10-CM | POA: Diagnosis not present

## 2017-10-17 MED ORDER — GLUCOSE BLOOD VI STRP: ORAL_STRIP | 12 refills | Status: DC

## 2017-10-17 MED ORDER — ACCU-CHEK SOFT TOUCH LANCETS MISC: 12 refills | Status: DC

## 2017-10-17 NOTE — Telephone Encounter (Signed)
Pt uses Accu-chek Guide glucometer. Supplies sent.

## 2017-10-21 ENCOUNTER — Ambulatory Visit: Admit: 2017-10-21 | Discharge: 2017-10-22 | Payer: MEDICARE

## 2017-10-21 DIAGNOSIS — L409 Psoriasis, unspecified: Secondary | ICD-10-CM | POA: Diagnosis not present

## 2017-10-21 DIAGNOSIS — I110 Hypertensive heart disease with heart failure: Secondary | ICD-10-CM | POA: Diagnosis not present

## 2017-10-21 DIAGNOSIS — J439 Emphysema, unspecified: Secondary | ICD-10-CM | POA: Diagnosis not present

## 2017-10-21 DIAGNOSIS — I2510 Atherosclerotic heart disease of native coronary artery without angina pectoris: Secondary | ICD-10-CM | POA: Diagnosis not present

## 2017-10-21 DIAGNOSIS — M810 Age-related osteoporosis without current pathological fracture: Secondary | ICD-10-CM | POA: Diagnosis not present

## 2017-10-21 DIAGNOSIS — E1143 Type 2 diabetes mellitus with diabetic autonomic (poly)neuropathy: Secondary | ICD-10-CM | POA: Diagnosis not present

## 2017-10-21 DIAGNOSIS — J9611 Chronic respiratory failure with hypoxia: Secondary | ICD-10-CM | POA: Diagnosis not present

## 2017-10-21 DIAGNOSIS — M069 Rheumatoid arthritis, unspecified: Secondary | ICD-10-CM | POA: Diagnosis not present

## 2017-10-21 DIAGNOSIS — E1142 Type 2 diabetes mellitus with diabetic polyneuropathy: Secondary | ICD-10-CM | POA: Diagnosis not present

## 2017-10-21 DIAGNOSIS — I4892 Unspecified atrial flutter: Secondary | ICD-10-CM | POA: Diagnosis not present

## 2017-10-22 DIAGNOSIS — J9611 Chronic respiratory failure with hypoxia: Secondary | ICD-10-CM | POA: Diagnosis not present

## 2017-10-22 DIAGNOSIS — M810 Age-related osteoporosis without current pathological fracture: Secondary | ICD-10-CM | POA: Diagnosis not present

## 2017-10-22 DIAGNOSIS — L409 Psoriasis, unspecified: Secondary | ICD-10-CM | POA: Diagnosis not present

## 2017-10-22 DIAGNOSIS — J439 Emphysema, unspecified: Secondary | ICD-10-CM | POA: Diagnosis not present

## 2017-10-22 DIAGNOSIS — M069 Rheumatoid arthritis, unspecified: Secondary | ICD-10-CM | POA: Diagnosis not present

## 2017-10-22 DIAGNOSIS — I110 Hypertensive heart disease with heart failure: Secondary | ICD-10-CM | POA: Diagnosis not present

## 2017-10-22 DIAGNOSIS — E1143 Type 2 diabetes mellitus with diabetic autonomic (poly)neuropathy: Secondary | ICD-10-CM | POA: Diagnosis not present

## 2017-10-22 DIAGNOSIS — I4892 Unspecified atrial flutter: Secondary | ICD-10-CM | POA: Diagnosis not present

## 2017-10-22 DIAGNOSIS — E1142 Type 2 diabetes mellitus with diabetic polyneuropathy: Secondary | ICD-10-CM | POA: Diagnosis not present

## 2017-10-22 DIAGNOSIS — I2510 Atherosclerotic heart disease of native coronary artery without angina pectoris: Secondary | ICD-10-CM | POA: Diagnosis not present

## 2017-10-23 ENCOUNTER — Telehealth: Payer: Self-pay | Admitting: Internal Medicine

## 2017-10-23 NOTE — Telephone Encounter (Signed)
Spoke w/ Susan, verbal orders given.  

## 2017-10-23 NOTE — Telephone Encounter (Signed)
Copied from CRM [id number]. Topic: Quick Communication - See Telephone Encounter >> Oct 23, 2017  4:29 PM Arlyss Gandy, NT wrote: CRM for notification. See Telephone encounter for: Joseph [name] with [facility] home health needing verbal orders for home health visits. Once a week for 9 weeks. Re-certification. CB#: [phone]  10/23/17.

## 2017-10-24 ENCOUNTER — Ambulatory Visit
Admit: 2017-10-24 | Discharge: 2017-10-24 | Payer: MEDICARE | Attending: Hematology & Oncology | Primary: Hematology & Oncology

## 2017-10-24 DIAGNOSIS — D709 Neutropenia, unspecified: Principal | ICD-10-CM

## 2017-10-24 DIAGNOSIS — D708 Other neutropenia: Secondary | ICD-10-CM

## 2017-10-24 DIAGNOSIS — L409 Psoriasis, unspecified: Secondary | ICD-10-CM | POA: Diagnosis not present

## 2017-10-24 DIAGNOSIS — E1142 Type 2 diabetes mellitus with diabetic polyneuropathy: Secondary | ICD-10-CM | POA: Diagnosis not present

## 2017-10-24 DIAGNOSIS — D696 Thrombocytopenia, unspecified: Secondary | ICD-10-CM | POA: Diagnosis not present

## 2017-10-24 DIAGNOSIS — I110 Hypertensive heart disease with heart failure: Secondary | ICD-10-CM | POA: Diagnosis not present

## 2017-10-24 DIAGNOSIS — A419 Sepsis, unspecified organism: Secondary | ICD-10-CM | POA: Diagnosis not present

## 2017-10-24 DIAGNOSIS — Z6828 Body mass index (BMI) 28.0-28.9, adult: Secondary | ICD-10-CM | POA: Diagnosis not present

## 2017-10-24 DIAGNOSIS — J449 Chronic obstructive pulmonary disease, unspecified: Secondary | ICD-10-CM | POA: Diagnosis not present

## 2017-10-24 DIAGNOSIS — M810 Age-related osteoporosis without current pathological fracture: Secondary | ICD-10-CM | POA: Diagnosis not present

## 2017-10-24 DIAGNOSIS — J439 Emphysema, unspecified: Secondary | ICD-10-CM | POA: Diagnosis not present

## 2017-10-24 DIAGNOSIS — I509 Heart failure, unspecified: Secondary | ICD-10-CM | POA: Diagnosis not present

## 2017-10-24 DIAGNOSIS — Z89421 Acquired absence of other right toe(s): Secondary | ICD-10-CM | POA: Diagnosis not present

## 2017-10-24 DIAGNOSIS — E1143 Type 2 diabetes mellitus with diabetic autonomic (poly)neuropathy: Secondary | ICD-10-CM | POA: Diagnosis not present

## 2017-10-24 DIAGNOSIS — M069 Rheumatoid arthritis, unspecified: Secondary | ICD-10-CM | POA: Diagnosis not present

## 2017-10-24 DIAGNOSIS — I2510 Atherosclerotic heart disease of native coronary artery without angina pectoris: Secondary | ICD-10-CM | POA: Diagnosis not present

## 2017-10-24 DIAGNOSIS — J9611 Chronic respiratory failure with hypoxia: Secondary | ICD-10-CM | POA: Diagnosis not present

## 2017-10-24 DIAGNOSIS — I4892 Unspecified atrial flutter: Secondary | ICD-10-CM | POA: Diagnosis not present

## 2017-10-24 DIAGNOSIS — E1140 Type 2 diabetes mellitus with diabetic neuropathy, unspecified: Secondary | ICD-10-CM | POA: Diagnosis not present

## 2017-10-27 ENCOUNTER — Emergency Department (HOSPITAL_BASED_OUTPATIENT_CLINIC_OR_DEPARTMENT_OTHER)
Admission: EM | Admit: 2017-10-27 | Discharge: 2017-10-27 | Disposition: A | Discharge status: Home / Self Care | Payer: Medicare HMO | Attending: Emergency Medicine | Admitting: Emergency Medicine

## 2017-10-27 ENCOUNTER — Emergency Department (HOSPITAL_BASED_OUTPATIENT_CLINIC_OR_DEPARTMENT_OTHER): Payer: Medicare HMO

## 2017-10-27 ENCOUNTER — Encounter (HOSPITAL_BASED_OUTPATIENT_CLINIC_OR_DEPARTMENT_OTHER): Payer: Self-pay | Admitting: *Deleted

## 2017-10-27 ENCOUNTER — Ambulatory Visit (INDEPENDENT_AMBULATORY_CARE_PROVIDER_SITE_OTHER): Payer: Medicare HMO | Admitting: Internal Medicine

## 2017-10-27 ENCOUNTER — Other Ambulatory Visit: Payer: Self-pay

## 2017-10-27 ENCOUNTER — Encounter: Payer: Self-pay | Admitting: Internal Medicine

## 2017-10-27 VITALS — BP 110/78 | HR 85 | Temp 97.9°F | Resp 18 | Ht 74.0 in | Wt 210.8 lb

## 2017-10-27 DIAGNOSIS — I110 Hypertensive heart disease with heart failure: Secondary | ICD-10-CM | POA: Insufficient documentation

## 2017-10-27 DIAGNOSIS — I2510 Atherosclerotic heart disease of native coronary artery without angina pectoris: Secondary | ICD-10-CM | POA: Insufficient documentation

## 2017-10-27 DIAGNOSIS — Z7982 Long term (current) use of aspirin: Secondary | ICD-10-CM | POA: Insufficient documentation

## 2017-10-27 DIAGNOSIS — R509 Fever, unspecified: Secondary | ICD-10-CM | POA: Insufficient documentation

## 2017-10-27 DIAGNOSIS — Z951 Presence of aortocoronary bypass graft: Secondary | ICD-10-CM | POA: Insufficient documentation

## 2017-10-27 DIAGNOSIS — J449 Chronic obstructive pulmonary disease, unspecified: Secondary | ICD-10-CM | POA: Insufficient documentation

## 2017-10-27 DIAGNOSIS — Z87891 Personal history of nicotine dependence: Secondary | ICD-10-CM | POA: Diagnosis not present

## 2017-10-27 DIAGNOSIS — Z79899 Other long term (current) drug therapy: Secondary | ICD-10-CM | POA: Insufficient documentation

## 2017-10-27 DIAGNOSIS — E119 Type 2 diabetes mellitus without complications: Secondary | ICD-10-CM | POA: Insufficient documentation

## 2017-10-27 DIAGNOSIS — I5032 Chronic diastolic (congestive) heart failure: Secondary | ICD-10-CM | POA: Insufficient documentation

## 2017-10-27 DIAGNOSIS — R000 Tachycardia, unspecified: Secondary | ICD-10-CM | POA: Diagnosis not present

## 2017-10-27 LAB — URINALYSIS, ROUTINE W REFLEX MICROSCOPIC
BILIRUBIN URINE: NEGATIVE
Glucose, UA: NEGATIVE mg/dL
HGB URINE DIPSTICK: NEGATIVE
KETONES UR: NEGATIVE mg/dL
Leukocytes, UA: NEGATIVE
NITRITE: NEGATIVE
PROTEIN: NEGATIVE mg/dL
SPECIFIC GRAVITY, URINE: 1.02 (ref 1.005–1.030)
pH: 6 (ref 5.0–8.0)

## 2017-10-27 LAB — INFLUENZA PANEL BY PCR (TYPE A & B)
INFLAPCR: NEGATIVE
INFLBPCR: NEGATIVE

## 2017-10-27 LAB — COMPREHENSIVE METABOLIC PANEL
ALT: 27 U/L (ref 17–63)
AST: 28 U/L (ref 15–41)
Albumin: 3.8 g/dL (ref 3.5–5.0)
Alkaline Phosphatase: 67 U/L (ref 38–126)
Anion gap: 9 (ref 5–15)
BILIRUBIN TOTAL: 0.7 mg/dL (ref 0.3–1.2)
BUN: 17 mg/dL (ref 6–20)
CHLORIDE: 99 mmol/L — AB (ref 101–111)
CO2: 27 mmol/L (ref 22–32)
CREATININE: 0.63 mg/dL (ref 0.61–1.24)
Calcium: 9 mg/dL (ref 8.9–10.3)
GFR calc Af Amer: >60 mL/min (ref >60)
Glucose, Bld: 151 mg/dL — ABNORMAL HIGH (ref 65–99)
POTASSIUM: 3.6 mmol/L (ref 3.5–5.1)
Sodium: 135 mmol/L (ref 135–145)
TOTAL PROTEIN: 7.6 g/dL (ref 6.5–8.1)

## 2017-10-27 LAB — CBC WITH DIFFERENTIAL/PLATELET
BASOS ABS: 0 10*3/uL (ref 0.0–0.1)
BASOS PCT: 0 %
EOS PCT: 0 %
Eosinophils Absolute: 0 10*3/uL (ref 0.0–0.7)
HCT: 39.2 % (ref 39.0–52.0)
Hemoglobin: 13.4 g/dL (ref 13.0–17.0)
LYMPHS PCT: 42 %
Lymphs Abs: 2.1 10*3/uL (ref 0.7–4.0)
MCH: 30 pg (ref 26.0–34.0)
MCHC: 34.2 g/dL (ref 30.0–36.0)
MCV: 87.7 fL (ref 78.0–100.0)
MONO ABS: 1 10*3/uL (ref 0.1–1.0)
Monocytes Relative: 20 %
Neutro Abs: 1.9 10*3/uL (ref 1.7–7.7)
Neutrophils Relative %: 38 %
PLATELETS: 156 10*3/uL (ref 150–400)
RBC: 4.47 MIL/uL (ref 4.22–5.81)
RDW: 17.3 % — AB (ref 11.5–15.5)
WBC: 4.9 10*3/uL (ref 4.0–10.5)

## 2017-10-27 LAB — PROTIME-INR
INR: 0.92
PROTHROMBIN TIME: 12.3 s (ref 11.4–15.2)

## 2017-10-27 LAB — I-STAT CG4 LACTIC ACID, ED: LACTIC ACID, VENOUS: 1.96 mmol/L — AB (ref 0.5–1.9)

## 2017-10-27 MED ORDER — ACETAMINOPHEN 325 MG PO TABS
650.0000 mg | ORAL_TABLET | Freq: Once | ORAL | Status: AC
  Administered 2017-10-27: 650 mg via ORAL
  Filled 2017-10-27: qty 2

## 2017-10-27 MED ORDER — VANCOMYCIN HCL IN DEXTROSE 1-5 GM/200ML-% IV SOLN: 1000.0000 mg | Freq: Three times a day (TID) | INTRAVENOUS | Status: DC

## 2017-10-27 MED ORDER — PIPERACILLIN-TAZOBACTAM 3.375 G IVPB 30 MIN
3.3750 g | Freq: Once | INTRAVENOUS | Status: AC
  Administered 2017-10-27: 3.375 g via INTRAVENOUS
  Filled 2017-10-27 (×2): qty 50

## 2017-10-27 MED ORDER — OSELTAMIVIR PHOSPHATE 75 MG PO CAPS: 75.0000 mg | ORAL_CAPSULE | Freq: Two times a day (BID) | ORAL | 0 refills | Status: DC

## 2017-10-27 MED ORDER — LEVOFLOXACIN 500 MG PO TABS: 500.0000 mg | ORAL_TABLET | Freq: Every day | ORAL | 0 refills | Status: DC

## 2017-10-27 MED ORDER — SODIUM CHLORIDE 0.9 % IV BOLUS (SEPSIS)
1000.0000 mL | Freq: Once | INTRAVENOUS | Status: AC
  Administered 2017-10-27: 1000 mL via INTRAVENOUS

## 2017-10-27 MED ORDER — VANCOMYCIN HCL IN DEXTROSE 1-5 GM/200ML-% IV SOLN
1000.0000 mg | Freq: Once | INTRAVENOUS | Status: AC
  Administered 2017-10-27: 1000 mg via INTRAVENOUS
  Filled 2017-10-27: qty 200

## 2017-10-27 MED ORDER — PIPERACILLIN-TAZOBACTAM 3.375 G IVPB: 3.3750 g | Freq: Three times a day (TID) | INTRAVENOUS | Status: DC

## 2017-10-27 MED FILL — levoFLOXacin 500 MG TABS: 500 | 7 days supply | Qty: 7 | Fill #0

## 2017-10-27 NOTE — Patient Instructions (Signed)
Start taking the antibiotic called Levaquin  No need to take Tamiflu which is for influenza, you tested negative  Drink plenty of fluids  Hold Tylenol  Check your temperature, if you have high or persistent fever let me know or go to the ER  Also, seek medical attention if you have a symptom that localized your fever such as severe cough, a joint that is particulalrly hot and red, diarrhea, rash, severe headache.

## 2017-10-27 NOTE — ED Provider Notes (Signed)
[facility] EMERGENCY DEPARTMENT Provider Note   CSN: [id number] Arrival date & time: 10/27/17  0054     History   Chief Complaint Chief Complaint  Patient presents with  . Fever    HPI Joseph [name] is a 67 y.o. male.  HPI  This is a 67 year old male with a history of coronary artery disease, COPD, diabetes, chronic hypoxia with oxygen dependency, rheumatoid arthritis who presents with chills.  Patient noted chills this evening.  Fever of 102.2 at home.  Fever of 102.4 here.  He reports a chronic dry cough.  No increased oxygen requirements.  Denies any chest pain, shortness of breath, abdominal pain, nausea, vomiting, diarrhea, dysuria or hematuria.  Denies any rashes or wounds.  Patient reports history of septic shock in December requiring prolonged hospitalization.  He is no longer taking methotrexate but he does take prednisone daily.  Past Medical History:  Diagnosis Date  . CAD (coronary artery disease)    MI 2007, CABG  . Cataracts, bilateral   . Complication of anesthesia    DIFFICULTY AFTER CABG HAD CONFUSION POST OP  . COPD (chronic obstructive pulmonary disease) (HCC)   . Diabetes mellitus    Dx 07-2008 A1C 6.2  . Esophageal dysmotility   . Fatty liver 10/11/09  . Gastroesophageal reflux disease   . Gastroparesis   . Hiatal hernia   . Hypertension   . Neuropathy    (related to RA per neuro note 05-2011)  . Onychomycosis   . Osteoporosis    per DEXA 05-2008  . Oxygen dependent    3l  . Peripheral polyneuropathy    Severe, causing problems with his feet, see surgeries  . Psoriasis   . Pulmonary nodules   . Rheumatoid arthritis [facility])    [facility]    Patient Active Problem List   Diagnosis Date Noted  . Sepsis (HCC) 09/02/2017  . Septic shock (HCC) 08/14/2017  . PVC (premature ventricular contraction) 07/29/2017  . Atrial flutter (HCC) 10/02/2016  . Hemoptysis 02/23/2016  . Chronic diastolic heart failure (HCC)   . Troponin level  elevated   . Chest pain 01/19/2016  . PCP NOTES >>>>>>>>>>>>>>>>>>>>>>>>>>>>>>>>>>>> 05/16/2015  . Chronic obstructive pulmonary emphysema (HCC) 01/13/2015  . Hepatic steatosis 04/17/2014  . Bronchiectasis (HCC) 05/05/2013  . Recurrent pneumonia 11/30/2012  . Pulmonary nodules 11/30/2012  . Gastroparesis 10/20/2012  . Barrett's esophagus 10/19/2012  . Chronic respiratory failure with hypoxia (HCC) 10/17/2012  . Annual physical exam 05/08/2012  . Sicca syndrome (HCC) 10/11/2011  . CAD (coronary artery disease)   . Carotid stenosis 12/10/2010  . Rheumatoid arthritis with rheumatoid factor (HCC) 11/02/2010  . HOARSENESS 10/22/2010  . DYSLIPIDEMIA 09/12/2009  . Type 2 diabetes, controlled, with neuropathy (HCC) 08/01/2008  . Pain in Soft Tissues of Limb 07/13/2008  . Osteoporosis 07/13/2008  . Coronary atherosclerosis 11/25/2006  . 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 Essential hypertension 09/16/2006  . GERD, Barrett's (09-2012 ), gastroparesis 06/06/2006  . PSORIASIS 06/06/2006    Past Surgical History:  Procedure Laterality Date  . A-FLUTTER ABLATION N/A 11/27/2016   Procedure: A-Flutter Ablation;  Surgeon: Marinus Maw, MD;  Location: [facility] INVASIVE CV LAB;  Service: Cardiovascular;  Laterality: N/A;  . achiles tendon surgery  8/09  . CARDIAC CATHETERIZATION N/A 01/23/2016   Procedure: Left Heart Cath and Cors/Grafts Angiography;  Surgeon: Lyn Records, MD;  Location: [facility] INVASIVE CV LAB;  Service: Cardiovascular;  Laterality: N/A;  . CARDIOVERSION N/A 10/23/2016  Procedure: CARDIOVERSION;  Surgeon: Rollene Rotunda, MD;  Location: [facility] ENDOSCOPY;  Service: Cardiovascular;  Laterality: N/A;  . CATARACT EXTRACTION Right 03/2017  . CATARACT EXTRACTION Left 05/2017  . CHOLECYSTECTOMY    . CORONARY ARTERY BYPASS GRAFT  2007   (LIMA to the LAD, left radial to obtuse marginal, SVG to first diagonal, SVG to PDA. His ejection fraction to 50-55%  .  ESOPHAGOGASTRODUODENOSCOPY N/A 10/19/2012   Procedure: ESOPHAGOGASTRODUODENOSCOPY (EGD);  Surgeon: Hart Carwin, MD;  Location: [facility] ENDOSCOPY;  Service: Endoscopy;  Laterality: N/A;  the dilatation is possible.   Marland Kitchen FOOT SURGERY     to tendon release and repair of osteomyelitis   . LARYNGOSCOPY N/A 01/21/2014   Procedure: LARYNGOSCOPY;  Surgeon: Christia Reading, MD;  Location: [facility] OR;  Service: ENT;  Laterality: N/A;  micro direct laryngoscopy with prolaryn injection/jet venturi ventilation  . TOE AMPUTATION  4-12/ 3 14   d/t a deformity, found to have osteomyelitis, complicated by post-op foot strtess FX       Home Medications    Prior to Admission medications   Medication Sig Start Date End Date Taking? Authorizing Provider  acetaminophen (TYLENOL) 500 MG tablet Take 500 mg by mouth every 6 (six) hours as needed for mild pain or headache. Max of 2000 mg/day    [provider]  albuterol (PROAIR HFA) 108 (90 Base) MCG/ACT inhaler Inhale 2 puffs into the lungs every 4 (four) hours as needed for wheezing or shortness of breath. 02/04/17   [name], MD  Ascorbic Acid (VITAMIN C) 500 MG tablet Take 500 mg by mouth daily.      [provider]  aspirin EC 81 MG tablet Take 81 mg by mouth daily.    [provider]  atorvastatin (LIPITOR) 80 MG tablet TAKE 1 TABLET(80 MG) BY MOUTH DAILY 10/21/16   Rollene Rotunda, MD  B Complex-C (SUPER B COMPLEX PO) Take 1 tablet by mouth daily.     [provider]  benzonatate (TESSALON) 100 MG capsule Take 1 capsule (100 mg total) by mouth 2 (two) times daily as needed for cough. Patient not taking: Reported on 10/14/2017 09/08/17   Wanda Plump, MD  Carbonyl Iron (PERFECT IRON PO) Take 1 tablet by mouth every morning.     [provider]  dextromethorphan-guaiFENesin (MUCINEX DM) 30-600 MG 12hr tablet Take 1 tablet by mouth 2 (two) times daily. 08/22/17   Drema Dallas, MD  fluticasone (FLONASE) 50 MCG/ACT nasal spray  Place 2 sprays into both nostrils 2 (two) times daily. Patient not taking: Reported on 10/14/2017 08/22/17   Drema Dallas, MD  Fluticasone-Umeclidin-Vilant (TRELEGY ELLIPTA) 100-62.5-25 MCG/INH AEPB Inhale 1 puff daily into the lungs. 07/08/17   [name], MD  folic acid (FOLVITE) 800 MCG tablet Take 800 mcg by mouth daily.    [provider]  furosemide (LASIX) 20 MG tablet Take 3 tablets (60 mg total) by mouth daily. 09/23/17   Rollene Rotunda, MD  glucose blood (ACCU-CHEK GUIDE) test strip Check blood sugars no more than twice daily 10/17/17   Wanda Plump, MD  ipratropium-albuterol (DUONEB) 0.5-2.5 (3) MG/3ML SOLN Take 3 mLs by nebulization every 4 (four) hours as needed. 08/22/17   Drema Dallas, MD  Lancets (ACCU-CHEK SOFT [facility]) lancets Check blood sugars no more than twice daily 10/17/17   Wanda Plump, MD  levofloxacin (LEVAQUIN) 500 MG tablet Take 1 tablet (500 mg total) by mouth daily. 10/27/17   Kian Ottaviano, Mayer Masker, MD  Loteprednol Etabonate (LOTEMAX) 0.5 % GEL Place 1 drop into both eyes every morning. daily 07/01/14   [provider]  Melatonin 1 MG CAPS Take 4 mg by mouth as needed (Sleep).    [provider]  Multiple Vitamins-Minerals (VITEYES AREDS ADVANCED PO) Take 1 tablet by mouth daily.    [provider]  nitroGLYCERIN (NITROSTAT) 0.4 MG SL tablet Place 1 tablet (0.4 mg total) under the tongue every 5 (five) minutes as needed for chest pain (up to 3 doses for severe chest pain. If no relief after 3rd dose, proceed to the ED for an evaluation). 10/04/16   Rollene Rotunda, MD  oseltamivir (TAMIFLU) 75 MG capsule Take 1 capsule (75 mg total) by mouth every 12 (twelve) hours. 10/27/17   Shiann Kam, Mayer Masker, MD  oxyCODONE-acetaminophen (PERCOCET) 7.5-325 MG per tablet Take 1 tablet by mouth 3 (three) times daily. 3 times a day    [provider]  OXYGEN Inhale 3 L into the lungs continuous.    [provider]  pantoprazole (PROTONIX) 40  MG tablet Take 1 tablet (40 mg total) by mouth 2 (two) times daily. 05/16/17   Armbruster, Willaim Rayas, MD  potassium chloride (K-DUR) 10 MEQ tablet Take 6 tablets (60 mEq total) by mouth daily. 09/23/17   Rollene Rotunda, MD  predniSONE (DELTASONE) 10 MG tablet Take 3 tablets (30 mg total) by mouth daily with breakfast. Take 30mg  daily until your FU with Rheumatologist Next week 09/04/17   11/02/17, MD  pregabalin (LYRICA) 75 MG capsule Take 75 mg by mouth 2 (two) times daily.    [provider]  PRESCRIPTION MEDICATION Apply 1 application topically as needed (psoriasis on scalp).    [provider]  PRESCRIPTION MEDICATION Apply 1 application topically as needed (psoriasis on face).    [provider]  PRESCRIPTION MEDICATION Apply 1 application topically as needed (psoriasis on body).    [provider]  sodium chloride (OCEAN) 0.65 % SOLN nasal spray Place 1 spray into both nostrils as needed for congestion.    [provider]  SUMAtriptan (IMITREX) 50 MG tablet Take 1 tablet (50 mg total) by mouth daily as needed for migraine. May repeat in 2 hours if headache persists or recurs. Patient not taking: Reported on 10/14/2017 08/22/17 08/23/18  08/25/18, MD  omeprazole (PRILOSEC OTC) 20 MG tablet Take 1 tablet (20 mg total) by mouth daily. 06/02/11 07/29/11  14/3/12, MD    Family History Family History  Problem Relation Age of Onset  . Heart disease Mother   . Diverticulitis Mother   . Heart disease Father   . Heart attack Father        x2  . Colon cancer Neg Hx   . Prostate cancer Neg Hx     Social History Social History   Tobacco Use  . Smoking status: Former Smoker    Packs/day: 1.00    Years: 40.00    Pack years: 40.00    Last attempt to quit: 08/26/2005    Years since quitting: 12.1  . Smokeless tobacco: Former 10/24/2005    Quit date: 04/02/2006  . Tobacco comment: smoked x 40 years, 1 ppd  Substance Use Topics  . Alcohol use:  No  . Drug use: No     Allergies   Patient has no known allergies.   Review of Systems Review of Systems  Constitutional: Positive for chills and fever.  HENT: Negative for congestion and sore throat.  Respiratory: Positive for cough. Negative for shortness of breath.   Cardiovascular: Negative for chest pain.  Gastrointestinal: Negative for abdominal pain, diarrhea, nausea and vomiting.  Genitourinary: Negative for dysuria and hematuria.  Musculoskeletal: Negative for neck pain.  Skin: Negative for rash and wound.  Neurological: Negative for headaches.  All other systems reviewed and are negative.    Physical Exam Updated Vital Signs BP 121/62   Pulse (!) 106   Temp 98.9 F (37.2 C) (Oral)   Resp (!) 21   Ht 6\' 2"  (1.88 m)   Wt 94.8 kg (209 lb)   SpO2 97%   BMI 26.83 kg/m   Physical Exam  Constitutional: He is oriented to person, place, and time.  Elderly, no acute distress  HENT:  Head: Normocephalic and atraumatic.  Eyes: Pupils are equal, round, and reactive to light.  Neck: Normal range of motion. Neck supple.  Cardiovascular: Normal rate, regular rhythm and normal heart sounds.  No murmur heard. Pulmonary/Chest: Effort normal and breath sounds normal. No respiratory distress. He has no wheezes.  Abdominal: Soft. Bowel sounds are normal. There is no tenderness. There is no rebound.  Musculoskeletal: He exhibits no edema.  Fractures bilateral hands with multiple deformities, bilateral feet with multiple amputated toes  Neurological: He is alert and oriented to person, place, and time.  Skin: Skin is warm and dry.  No open wounds or sores noted  Psychiatric: He has a normal mood and affect.  Nursing note and vitals reviewed.    ED Treatments / Results  Labs (all labs ordered are listed, but only abnormal results are displayed) Labs Reviewed  COMPREHENSIVE METABOLIC PANEL - Abnormal; Notable for the following components:      Result Value   Chloride  99 (*)    Glucose, Bld 151 (*)    All other components within normal limits  CBC WITH DIFFERENTIAL/PLATELET - Abnormal; Notable for the following components:   RDW 17.3 (*)    All other components within normal limits  I-STAT CG4 LACTIC ACID, ED - Abnormal; Notable for the following components:   Lactic Acid, Venous 1.96 (*)    All other components within normal limits  CULTURE, BLOOD (ROUTINE X 2)  CULTURE, BLOOD (ROUTINE X 2)  PROTIME-INR  URINALYSIS, ROUTINE W REFLEX MICROSCOPIC  INFLUENZA PANEL BY PCR (TYPE A & B)  I-STAT CG4 LACTIC ACID, ED    EKG  EKG Interpretation  Date/Time:  Monday October 27 2017 01:52:47 EST Ventricular Rate:  100 PR Interval:    QRS Duration: 83 QT Interval:  310 QTC Calculation: 400 R Axis:   12 Text Interpretation:  Sinus tachycardia Ventricular premature complex Probable left atrial enlargement Low voltage, precordial leads Anteroseptal infarct, old No significant change since last tracing Confirmed by Ross Marcus ([id number]) on 10/27/2017 2:12:07 AM       Radiology Dg Chest 2 View  Result Date: 10/27/2017 CLINICAL DATA:  Fever EXAM: CHEST  2 VIEW COMPARISON:  09/03/2017, 09/02/2017, 08/17/2017, 08/20/2016. CT chest 08/14/2017 FINDINGS: Post sternotomy changes. Diffuse emphysematous disease with thick-walled cyst in the left upper lung as before. Coarse interstitial opacities consistent with chronic change. No acute consolidation or effusion. Stable cardiomediastinal silhouette with aortic atherosclerosis. No pneumothorax. IMPRESSION: No active cardiopulmonary disease. Similar appearance of emphysematous disease and chronic coarse interstitial changes. Electronically Signed   By: Jasmine Pang M.D.   On: 10/27/2017 01:51    Procedures Procedures (including critical care time)  Medications Ordered in ED Medications  vancomycin (VANCOCIN) IVPB  1000 mg/200 mL premix (not administered)  piperacillin-tazobactam (ZOSYN) IVPB 3.375 g (not  administered)  sodium chloride 0.9 % bolus 1,000 mL (0 mLs Intravenous Stopped 10/27/17 0300)  piperacillin-tazobactam (ZOSYN) IVPB 3.375 g (0 g Intravenous Stopped 10/27/17 0248)  vancomycin (VANCOCIN) IVPB 1000 mg/200 mL premix (1,000 mg Intravenous New Bag/Given 10/27/17 0218)  acetaminophen (TYLENOL) tablet 650 mg (650 mg Oral Given 10/27/17 0257)     Initial Impression / Assessment and Plan / ED Course  I have reviewed the triage vital signs and the nursing notes.  Pertinent labs & imaging results that were available during my care of the patient were reviewed by me and considered in my medical decision making (see chart for details).     Patient presents with fever and chills.  No other significant new symptoms.  He is on chronic home O2.  Noted to be febrile to 102 but otherwise nontoxic appearing.  No acute distress.  Lung sounds are actually clear.  No signs or symptoms of skin infection or cellulitis.  Sepsis workup initiated given history.  Given propensity of fluid in the patient is high risk, influenza screen was also sent.  Patient was given 1 L of fluids and broad-spectrum antibiotics.  Lactate less than 2.  No significant leukocytosis or leukopenia.  Metabolic panel and urinalysis are reassuring.  On multiple rechecks, patient is resting comfortably at his baseline.  Mild tachycardia likely related to fever.  Given his risk factors, would offer Tamiflu.  Chest x-ray shows no evidence of pneumonia; however, given respiratory history and immunosuppression, would also offer antibiotics to cover for pneumonia that may not be showing up on x-ray.  Patient is agreeable to plan.  He has follow-up later today at his primary office.  Cultures are pending.  After history, exam, and medical workup I feel the patient has been appropriately medically screened and is safe for discharge home. Pertinent diagnoses were discussed with the patient. Patient was given return precautions.   Final Clinical  Impressions(s) / ED Diagnoses   Final diagnoses:  Febrile illness    ED Discharge Orders        Ordered    levofloxacin (LEVAQUIN) 500 MG tablet  Daily     10/27/17 0354    oseltamivir (TAMIFLU) 75 MG capsule  Every 12 hours     10/27/17 0354       Ritu Gagliardo, Mayer Masker, MD 10/27/17 [phone]

## 2017-10-27 NOTE — ED Triage Notes (Signed)
Pt states temp this evening with chills starting around 11 pm. Pt is on 3L nasal cannula home oxygen for COPD. Also reports hx of RA and suppressed immune system

## 2017-10-27 NOTE — Progress Notes (Signed)
Pharmacy Antibiotic Note  Joseph [name] is a 67 y.o. male admitted on 10/27/2017 with sepsis.  Pharmacy has been consulted for Vancomycin/Zosyn dosing. Pt presents to [facility] tonight with chills/fever. Tmax 102.4. WBC WNL. Renal function good. Lactic acid elevated.   Plan: Vancomycin 1000 mg IV q8h Zosyn 3.375G IV q8h to be infused over 4 hours Trend WBC, temp, renal function  F/U infectious work-up Drug levels as indicated   Height: 6\' 2"  (188 cm) Weight: 209 lb (94.8 kg) IBW/kg (Calculated) : 82.2  Temp (24hrs), Avg:102.4 F (39.1 C), Min:102.4 F (39.1 C), Max:102.4 F (39.1 C)  Recent Labs  Lab 10/27/17 0124  WBC 4.9  CREATININE 0.63  LATICACIDVEN 1.96*    Estimated Creatinine Clearance: 107 mL/min (by C-G formula based on SCr of 0.63 mg/dL).    No Known Allergies  12/27/17 10/27/2017 2:44 AM

## 2017-10-27 NOTE — Discharge Instructions (Signed)
You were seen today for fever.  You have no signs of sepsis.  Follow-up with your primary physician.  If you develop worsening symptoms you should be reevaluated immediately.

## 2017-10-27 NOTE — ED Notes (Signed)
Dr. Wilkie Aye made aware of pt status

## 2017-10-27 NOTE — Progress Notes (Signed)
Subjective:    Patient ID: Joseph [name], male    DOB: [birth year], 67 y.o.   MRN: [id number]  DOS:  10/27/2017 Type of visit - description : ER f/u Interval history: Went to the ER earlier today with fever up to 102.4, pt very concerned about getting septic again Workup included a CMP, CBC, chest x-ray which were okay. Influenza testing negative, UA negative, He received IV antibiotics preemptively and blood cultures were sent. He was subsequently released home.  Review of Systems After he left the ER at around 4 AM in the morning, he went home, slept, and wake up feeling very good. No further fever, he did have a Tylenol today at 9 AM. Has not started Tamiflu or Levaquin which were Rx per the  ER.  Cough is at baseline Denies nausea, vomiting, diarrhea.  No abdominal pain No rash or headache No sinus congestion No joint is particularly red, swollen or painful. Overall he states he is feeling very good in the last few days.  Past Medical History:  Diagnosis Date  . CAD (coronary artery disease)    MI 2007, CABG  . Cataracts, bilateral   . Complication of anesthesia    DIFFICULTY AFTER CABG HAD CONFUSION POST OP  . COPD (chronic obstructive pulmonary disease) (HCC)   . Diabetes mellitus    Dx 07-2008 A1C 6.2  . Esophageal dysmotility   . Fatty liver 10/11/09  . Gastroesophageal reflux disease   . Gastroparesis   . Hiatal hernia   . Hypertension   . Neuropathy    (related to RA per neuro note 05-2011)  . Onychomycosis   . Osteoporosis    per DEXA 05-2008  . Oxygen dependent    3l  . Peripheral polyneuropathy    Severe, causing problems with his feet, see surgeries  . Psoriasis   . Pulmonary nodules   . Rheumatoid arthritis [facility])    [facility]    Past Surgical History:  Procedure Laterality Date  . A-FLUTTER ABLATION N/A 11/27/2016   Procedure: A-Flutter Ablation;  Surgeon: Marinus Maw, MD;  Location: [facility] INVASIVE CV LAB;  Service: Cardiovascular;   Laterality: N/A;  . achiles tendon surgery  8/09  . CARDIAC CATHETERIZATION N/A 01/23/2016   Procedure: Left Heart Cath and Cors/Grafts Angiography;  Surgeon: Lyn Records, MD;  Location: [facility] INVASIVE CV LAB;  Service: Cardiovascular;  Laterality: N/A;  . CARDIOVERSION N/A 10/23/2016   Procedure: CARDIOVERSION;  Surgeon: Rollene Rotunda, MD;  Location: [facility] ENDOSCOPY;  Service: Cardiovascular;  Laterality: N/A;  . CATARACT EXTRACTION Right 03/2017  . CATARACT EXTRACTION Left 05/2017  . CHOLECYSTECTOMY    . CORONARY ARTERY BYPASS GRAFT  2007   (LIMA to the LAD, left radial to obtuse marginal, SVG to first diagonal, SVG to PDA. His ejection fraction to 50-55%  . ESOPHAGOGASTRODUODENOSCOPY N/A 10/19/2012   Procedure: ESOPHAGOGASTRODUODENOSCOPY (EGD);  Surgeon: Hart Carwin, MD;  Location: [facility] ENDOSCOPY;  Service: Endoscopy;  Laterality: N/A;  the dilatation is possible.   Marland Kitchen FOOT SURGERY     to tendon release and repair of osteomyelitis   . LARYNGOSCOPY N/A 01/21/2014   Procedure: LARYNGOSCOPY;  Surgeon: Christia Reading, MD;  Location: [facility] OR;  Service: ENT;  Laterality: N/A;  micro direct laryngoscopy with prolaryn injection/jet venturi ventilation  . TOE AMPUTATION  4-12/ 3 14   d/t a deformity, found to have osteomyelitis, complicated by post-op foot strtess FX    Social History   Socioeconomic History  . Marital  status: Married    Spouse name: Not on file  . Number of children: 0  . Years of education: Not on file  . Highest education level: Not on file  Social Needs  . Financial resource strain: Not on file  . Food insecurity - worry: Not on file  . Food insecurity - inability: Not on file  . Transportation needs - medical: Not on file  . Transportation needs - non-medical: Not on file  Occupational History  . Occupation: Disabled-retired    Associate Professor: UNEMPLOYED  Tobacco Use  . Smoking status: Former Smoker    Packs/day: 1.00    Years: 40.00    Pack years: 40.00    Last attempt to  quit: 08/26/2005    Years since quitting: 12.1  . Smokeless tobacco: Former Neurosurgeon    Quit date: 04/02/2006  . Tobacco comment: smoked x 40 years, 1 ppd  Substance and Sexual Activity  . Alcohol use: No  . Drug use: No  . Sexual activity: Not on file  Other Topics Concern  . Not on file  Social History Narrative   Household- pt and wife      Allergies as of 10/27/2017   No Known Allergies     Medication List        Accurate as of 10/27/17  1:42 PM. Always use your most recent med list.          accu-chek soft touch lancets Check blood sugars no more than twice daily   acetaminophen 500 MG tablet Commonly known as:  TYLENOL Take 500 mg by mouth every 6 (six) hours as needed for mild pain or headache. Max of 2000 mg/day   albuterol 108 (90 Base) MCG/ACT inhaler Commonly known as:  PROAIR HFA Inhale 2 puffs into the lungs every 4 (four) hours as needed for wheezing or shortness of breath.   aspirin EC 81 MG tablet Take 81 mg by mouth daily.   atorvastatin 80 MG tablet Commonly known as:  LIPITOR TAKE 1 TABLET(80 MG) BY MOUTH DAILY   dextromethorphan-guaiFENesin 30-600 MG 12hr tablet Commonly known as:  MUCINEX DM Take 1 tablet by mouth 2 (two) times daily.   folic acid 800 MCG tablet Commonly known as:  FOLVITE Take 800 mcg by mouth daily.   furosemide 20 MG tablet Commonly known as:  LASIX Take 3 tablets (60 mg total) by mouth daily.   glucose blood test strip Commonly known as:  ACCU-CHEK GUIDE Check blood sugars no more than twice daily   ipratropium-albuterol 0.5-2.5 (3) MG/3ML Soln Commonly known as:  DUONEB Take 3 mLs by nebulization every 4 (four) hours as needed.   levofloxacin 500 MG tablet Commonly known as:  LEVAQUIN Take 1 tablet (500 mg total) by mouth daily.   LOTEMAX 0.5 % Gel Generic drug:  Loteprednol Etabonate Place 1 drop into both eyes every morning. daily   Melatonin 1 MG Caps Take 4 mg by mouth as needed (Sleep).   nitroGLYCERIN  0.4 MG SL tablet Commonly known as:  NITROSTAT Place 1 tablet (0.4 mg total) under the tongue every 5 (five) minutes as needed for chest pain (up to 3 doses for severe chest pain. If no relief after 3rd dose, proceed to the ED for an evaluation).   oseltamivir 75 MG capsule Commonly known as:  TAMIFLU Take 1 capsule (75 mg total) by mouth every 12 (twelve) hours.   oxyCODONE-acetaminophen 7.5-325 MG tablet Commonly known as:  PERCOCET Take 1 tablet by mouth 3 (  three) times daily. 3 times a day   OXYGEN Inhale 3 L into the lungs continuous.   pantoprazole 40 MG tablet Commonly known as:  PROTONIX Take 1 tablet (40 mg total) by mouth 2 (two) times daily.   PERFECT IRON PO Take 1 tablet by mouth every morning.   potassium chloride 10 MEQ tablet Commonly known as:  K-DUR Take 6 tablets (60 mEq total) by mouth daily.   predniSONE 10 MG tablet Commonly known as:  DELTASONE Take 3 tablets (30 mg total) by mouth daily with breakfast. Take 30mg  daily until your FU with Rheumatologist Next week   pregabalin 75 MG capsule Commonly known as:  LYRICA Take 75 mg by mouth 2 (two) times daily.   PRESCRIPTION MEDICATION Apply 1 application topically as needed (psoriasis on scalp).   PRESCRIPTION MEDICATION Apply 1 application topically as needed (psoriasis on face).   PRESCRIPTION MEDICATION Apply 1 application topically as needed (psoriasis on body).   sodium chloride 0.65 % Soln nasal spray Commonly known as:  OCEAN Place 1 spray into both nostrils as needed for congestion.   SUPER B COMPLEX PO Take 1 tablet by mouth daily.   vitamin C 500 MG tablet Commonly known as:  ASCORBIC ACID Take 500 mg by mouth daily.   VITEYES AREDS ADVANCED PO Take 1 tablet by mouth daily.          Objective:   Physical Exam BP 110/78 (BP Location: Right Arm, Patient Position: Sitting, Cuff Size: Normal)   Pulse 85   Temp 97.9 F (36.6 C) (Oral)   Resp 18   Ht 6\' 2"  (1.88 m)   Wt 210  lb 12.8 oz (95.6 kg)   SpO2 98%   BMI 27.07 kg/m  General:   Well developed, well nourished . NAD.  HEENT:  Normocephalic . Face symmetric, atraumatic Lungs:  CTA B Normal respiratory effort, no intercostal retractions, no accessory muscle use. Heart: RRR,  no murmur.  No pretibial edema bilaterally  Skin: Not pale. Not jaundice Neurologic:  alert & oriented X3.  Speech normal, gait at baseline. Psych--  Cognition and judgment appear intact.  Cooperative with normal attention span and concentration.  Behavior appropriate. No anxious or depressed appearing.      Assessment & Plan:    Assessment  MSK: --RA --- Dr --Psoriasis --Osteoporosis Peripheral neuropathy, severe, several feet surgeries DM HTN Hyperlipidemia Pulmonary: --COPD --Pulmonary fibrosis --O2 24/ 7 --Chronic respiratory failure CV: --CAD, CHF, MI and  CABG 2007 --CP- CT chest no PE, cath 01-23-16, one occluded graft, rx medical mngmt  dx: question of pericarditis  --Pericarditis, admitted 01/2016 --New  onset A. Flutter 09-2016, admitted. Ablation 4-18, 12-2016: d/c OAC, rx asa 81; restart OAC if flutter return GI --Barrettts --GERD --esophageal dysmotility --Fatty liver Hoarseness , chronic, saw ENT , s/p botox 2015 , helped tempoarrily  Has a Hospital bed   PLAN Fever: Seen at the ER with a single episode of fever, ROS is negative, XR and all other tests including a influenza test were negative.  Blood cultures pending. We talk about the pros and cons of taking antibiotics until the blood cultures came back, the major problem would be developing of C. difficile.  After this discussion we agreed to take Levaquin (but no Tamiflu)  until blood cultures came back. Strict precautions provided regards seeking further medical care, see AVS. Also, he is moving to [facility] in few weeks, he has an appointment at this office in 3  weeks before he moves.

## 2017-10-28 NOTE — Assessment & Plan Note (Signed)
Fever: Seen at the ER with a single episode of fever, ROS is negative, XR and all other tests including a influenza test were negative.  Blood cultures pending. We talk about the pros and cons of taking antibiotics until the blood cultures came back, the major problem would be developing of C. difficile.  After this discussion we agreed to take Levaquin (but no Tamiflu)  until blood cultures came back. Strict precautions provided regards seeking further medical care, see AVS. Also, he is moving to [facility] in few weeks, he has an appointment at this office in 3 weeks before he moves.

## 2017-10-29 ENCOUNTER — Telehealth: Payer: Self-pay | Admitting: Internal Medicine

## 2017-10-29 NOTE — Telephone Encounter (Signed)
Please advise 

## 2017-10-29 NOTE — Telephone Encounter (Signed)
Copied from CRM [id number]. Topic: Inquiry >> Oct 29, 2017  4:48 PM Landry Mellow wrote: Reason for CRM: pt is calling about blood culture. He would like call back when results are ready.  He needs to know If he should continue to take the levoflaxin or stop it. Please call cell [phone]

## 2017-10-30 NOTE — Telephone Encounter (Signed)
So far negative, final results pending

## 2017-10-30 NOTE — Telephone Encounter (Signed)
If by tomorrow, cultures are negative, will d/c antibiotics.

## 2017-10-30 NOTE — Telephone Encounter (Signed)
Dr. Drue Novel- should Pt continue Abx?

## 2017-10-31 ENCOUNTER — Telehealth: Payer: Self-pay | Admitting: *Deleted

## 2017-10-31 ENCOUNTER — Other Ambulatory Visit: Payer: Self-pay | Admitting: Cardiology

## 2017-10-31 DIAGNOSIS — E1142 Type 2 diabetes mellitus with diabetic polyneuropathy: Secondary | ICD-10-CM | POA: Diagnosis not present

## 2017-10-31 DIAGNOSIS — J439 Emphysema, unspecified: Secondary | ICD-10-CM | POA: Diagnosis not present

## 2017-10-31 DIAGNOSIS — I110 Hypertensive heart disease with heart failure: Secondary | ICD-10-CM | POA: Diagnosis not present

## 2017-10-31 DIAGNOSIS — J9611 Chronic respiratory failure with hypoxia: Secondary | ICD-10-CM | POA: Diagnosis not present

## 2017-10-31 DIAGNOSIS — L409 Psoriasis, unspecified: Secondary | ICD-10-CM | POA: Diagnosis not present

## 2017-10-31 DIAGNOSIS — M810 Age-related osteoporosis without current pathological fracture: Secondary | ICD-10-CM | POA: Diagnosis not present

## 2017-10-31 DIAGNOSIS — I4892 Unspecified atrial flutter: Secondary | ICD-10-CM | POA: Diagnosis not present

## 2017-10-31 DIAGNOSIS — I5020 Unspecified systolic (congestive) heart failure: Secondary | ICD-10-CM | POA: Diagnosis not present

## 2017-10-31 DIAGNOSIS — I2510 Atherosclerotic heart disease of native coronary artery without angina pectoris: Secondary | ICD-10-CM | POA: Diagnosis not present

## 2017-10-31 DIAGNOSIS — M069 Rheumatoid arthritis, unspecified: Secondary | ICD-10-CM | POA: Diagnosis not present

## 2017-10-31 NOTE — Telephone Encounter (Signed)
Waiting for final results to come in.

## 2017-10-31 NOTE — Telephone Encounter (Signed)
Patient calling to check the status of his blood culture. If someone could give him a call back about this at [phone]

## 2017-10-31 NOTE — ED Notes (Addendum)
10/31/2017, Pt. Called and requested his Blood Culture reports.  Informed pt. That the Blood cultures were still pending  And they had not growth.  Informed pt. That he can follow-up with his PCP>  Pt. Verbalized understanding and all questions answered.

## 2017-10-31 NOTE — Telephone Encounter (Signed)
Received Physician Orders/Plan of Care from [facility]; forwarded to provider/SLS 03/08

## 2017-10-31 NOTE — Telephone Encounter (Signed)
Spoke w/ Pt- informed of results and recommendations. Pt verbalized understanding.  

## 2017-10-31 NOTE — Telephone Encounter (Signed)
Blood cultures, no growth @ 3 days, final result pending. Advised patient: So far cultures negative, if he is feeling well,  okay to stop oral antibiotics

## 2017-11-01 LAB — CULTURE, BLOOD (ROUTINE X 2)
CULTURE: NO GROWTH
CULTURE: NO GROWTH
Special Requests: ADEQUATE
Special Requests: ADEQUATE

## 2017-11-02 ENCOUNTER — Other Ambulatory Visit: Payer: Self-pay | Admitting: Cardiology

## 2017-11-03 NOTE — Telephone Encounter (Signed)
PT Plan of Care signed and mailed back to [facility] in envelope provided. Copy of form sent for scanning.

## 2017-11-04 ENCOUNTER — Telehealth: Payer: Self-pay | Admitting: Internal Medicine

## 2017-11-04 ENCOUNTER — Other Ambulatory Visit: Payer: Self-pay | Admitting: *Deleted

## 2017-11-04 DIAGNOSIS — L409 Psoriasis, unspecified: Secondary | ICD-10-CM | POA: Diagnosis not present

## 2017-11-04 DIAGNOSIS — M810 Age-related osteoporosis without current pathological fracture: Secondary | ICD-10-CM | POA: Diagnosis not present

## 2017-11-04 DIAGNOSIS — R69 Illness, unspecified: Secondary | ICD-10-CM | POA: Diagnosis not present

## 2017-11-04 DIAGNOSIS — I4892 Unspecified atrial flutter: Secondary | ICD-10-CM | POA: Diagnosis not present

## 2017-11-04 DIAGNOSIS — M069 Rheumatoid arthritis, unspecified: Secondary | ICD-10-CM | POA: Diagnosis not present

## 2017-11-04 DIAGNOSIS — I5020 Unspecified systolic (congestive) heart failure: Secondary | ICD-10-CM | POA: Diagnosis not present

## 2017-11-04 DIAGNOSIS — J9611 Chronic respiratory failure with hypoxia: Secondary | ICD-10-CM | POA: Diagnosis not present

## 2017-11-04 DIAGNOSIS — J439 Emphysema, unspecified: Secondary | ICD-10-CM | POA: Diagnosis not present

## 2017-11-04 DIAGNOSIS — E1142 Type 2 diabetes mellitus with diabetic polyneuropathy: Secondary | ICD-10-CM | POA: Diagnosis not present

## 2017-11-04 DIAGNOSIS — I110 Hypertensive heart disease with heart failure: Secondary | ICD-10-CM | POA: Diagnosis not present

## 2017-11-04 DIAGNOSIS — I2510 Atherosclerotic heart disease of native coronary artery without angina pectoris: Secondary | ICD-10-CM | POA: Diagnosis not present

## 2017-11-04 MED ORDER — GLUCOSE BLOOD VI STRP: ORAL_STRIP | 12 refills | Status: DC

## 2017-11-04 MED ORDER — ONETOUCH LANCETS MISC: 12 refills | Status: DC

## 2017-11-04 NOTE — Telephone Encounter (Signed)
Test strips and supplies were sent to pharmacy on 10/17/2017. Will call regarding lab results after lunch.

## 2017-11-04 NOTE — Telephone Encounter (Signed)
Copied from CRM [id number]. Topic: Quick Communication - Rx Refill/Question >> Nov 04, 2017 10:12 AM Rudi Coco, NT wrote: Medication: one touch verio test strips and one touch verio lancets   Has the patient contacted their pharmacy? yes   (Agent: If no, request that the patient contact the pharmacy for the refill.)   Preferred Pharmacy (with phone number or street name): [id number] [place], [place] - [address] [place] [address] Phone: [phone] Fax: [phone]     Agent: Please be advised that RX refills may take up to 3 business days. We ask that you follow-up with your pharmacy.

## 2017-11-04 NOTE — Telephone Encounter (Signed)
Spoke w/ Pt, informed of blood culture results and recommendations. Pt verbalized understanding.

## 2017-11-04 NOTE — Telephone Encounter (Signed)
1.  Needs refills, see below 2.  Blood cultures eventually negative at 5 days, let the patient known, he already stop antibiotics by mouth.  Call if fevers resurface

## 2017-11-06 DIAGNOSIS — M069 Rheumatoid arthritis, unspecified: Secondary | ICD-10-CM | POA: Diagnosis not present

## 2017-11-06 DIAGNOSIS — J439 Emphysema, unspecified: Secondary | ICD-10-CM | POA: Diagnosis not present

## 2017-11-06 DIAGNOSIS — E1142 Type 2 diabetes mellitus with diabetic polyneuropathy: Secondary | ICD-10-CM | POA: Diagnosis not present

## 2017-11-06 DIAGNOSIS — I4892 Unspecified atrial flutter: Secondary | ICD-10-CM | POA: Diagnosis not present

## 2017-11-06 DIAGNOSIS — M810 Age-related osteoporosis without current pathological fracture: Secondary | ICD-10-CM | POA: Diagnosis not present

## 2017-11-06 DIAGNOSIS — J9611 Chronic respiratory failure with hypoxia: Secondary | ICD-10-CM | POA: Diagnosis not present

## 2017-11-06 DIAGNOSIS — I5020 Unspecified systolic (congestive) heart failure: Secondary | ICD-10-CM | POA: Diagnosis not present

## 2017-11-06 DIAGNOSIS — L409 Psoriasis, unspecified: Secondary | ICD-10-CM | POA: Diagnosis not present

## 2017-11-06 DIAGNOSIS — I110 Hypertensive heart disease with heart failure: Secondary | ICD-10-CM | POA: Diagnosis not present

## 2017-11-06 DIAGNOSIS — I2510 Atherosclerotic heart disease of native coronary artery without angina pectoris: Secondary | ICD-10-CM | POA: Diagnosis not present

## 2017-11-07 DIAGNOSIS — I5020 Unspecified systolic (congestive) heart failure: Secondary | ICD-10-CM | POA: Diagnosis not present

## 2017-11-07 DIAGNOSIS — M810 Age-related osteoporosis without current pathological fracture: Secondary | ICD-10-CM | POA: Diagnosis not present

## 2017-11-07 DIAGNOSIS — J439 Emphysema, unspecified: Secondary | ICD-10-CM | POA: Diagnosis not present

## 2017-11-07 DIAGNOSIS — E1142 Type 2 diabetes mellitus with diabetic polyneuropathy: Secondary | ICD-10-CM | POA: Diagnosis not present

## 2017-11-07 DIAGNOSIS — I4892 Unspecified atrial flutter: Secondary | ICD-10-CM | POA: Diagnosis not present

## 2017-11-07 DIAGNOSIS — M069 Rheumatoid arthritis, unspecified: Secondary | ICD-10-CM | POA: Diagnosis not present

## 2017-11-07 DIAGNOSIS — J9611 Chronic respiratory failure with hypoxia: Secondary | ICD-10-CM | POA: Diagnosis not present

## 2017-11-07 DIAGNOSIS — L409 Psoriasis, unspecified: Secondary | ICD-10-CM | POA: Diagnosis not present

## 2017-11-07 DIAGNOSIS — I2510 Atherosclerotic heart disease of native coronary artery without angina pectoris: Secondary | ICD-10-CM | POA: Diagnosis not present

## 2017-11-07 DIAGNOSIS — I110 Hypertensive heart disease with heart failure: Secondary | ICD-10-CM | POA: Diagnosis not present

## 2017-11-08 ENCOUNTER — Emergency Department (HOSPITAL_BASED_OUTPATIENT_CLINIC_OR_DEPARTMENT_OTHER): Payer: Medicare HMO

## 2017-11-08 ENCOUNTER — Other Ambulatory Visit: Payer: Self-pay

## 2017-11-08 ENCOUNTER — Encounter (HOSPITAL_BASED_OUTPATIENT_CLINIC_OR_DEPARTMENT_OTHER): Payer: Self-pay | Admitting: *Deleted

## 2017-11-08 ENCOUNTER — Inpatient Hospital Stay (HOSPITAL_BASED_OUTPATIENT_CLINIC_OR_DEPARTMENT_OTHER)
Admission: EM | Admit: 2017-11-08 | Discharge: 2017-11-12 | DRG: 809 | Disposition: A | Discharge status: Home Health | Payer: Medicare HMO | Attending: Family Medicine | Admitting: Family Medicine

## 2017-11-08 DIAGNOSIS — R509 Fever, unspecified: Secondary | ICD-10-CM | POA: Diagnosis not present

## 2017-11-08 DIAGNOSIS — R0789 Other chest pain: Secondary | ICD-10-CM

## 2017-11-08 DIAGNOSIS — E1142 Type 2 diabetes mellitus with diabetic polyneuropathy: Secondary | ICD-10-CM | POA: Diagnosis present

## 2017-11-08 DIAGNOSIS — I252 Old myocardial infarction: Secondary | ICD-10-CM

## 2017-11-08 DIAGNOSIS — R740 Nonspecific elevation of levels of transaminase and lactic acid dehydrogenase [LDH]: Secondary | ICD-10-CM | POA: Diagnosis not present

## 2017-11-08 DIAGNOSIS — K760 Fatty (change of) liver, not elsewhere classified: Secondary | ICD-10-CM | POA: Diagnosis present

## 2017-11-08 DIAGNOSIS — J9611 Chronic respiratory failure with hypoxia: Secondary | ICD-10-CM | POA: Diagnosis present

## 2017-11-08 DIAGNOSIS — J449 Chronic obstructive pulmonary disease, unspecified: Secondary | ICD-10-CM | POA: Diagnosis present

## 2017-11-08 DIAGNOSIS — E1143 Type 2 diabetes mellitus with diabetic autonomic (poly)neuropathy: Secondary | ICD-10-CM | POA: Diagnosis present

## 2017-11-08 DIAGNOSIS — I2510 Atherosclerotic heart disease of native coronary artery without angina pectoris: Secondary | ICD-10-CM | POA: Diagnosis present

## 2017-11-08 DIAGNOSIS — R7989 Other specified abnormal findings of blood chemistry: Secondary | ICD-10-CM

## 2017-11-08 DIAGNOSIS — I5032 Chronic diastolic (congestive) heart failure: Secondary | ICD-10-CM | POA: Diagnosis present

## 2017-11-08 DIAGNOSIS — Z9981 Dependence on supplemental oxygen: Secondary | ICD-10-CM

## 2017-11-08 DIAGNOSIS — I110 Hypertensive heart disease with heart failure: Secondary | ICD-10-CM | POA: Diagnosis present

## 2017-11-08 DIAGNOSIS — K3184 Gastroparesis: Secondary | ICD-10-CM | POA: Diagnosis present

## 2017-11-08 DIAGNOSIS — D704 Cyclic neutropenia: Principal | ICD-10-CM | POA: Diagnosis present

## 2017-11-08 DIAGNOSIS — D709 Neutropenia, unspecified: Secondary | ICD-10-CM | POA: Diagnosis present

## 2017-11-08 DIAGNOSIS — Z66 Do not resuscitate: Secondary | ICD-10-CM | POA: Diagnosis present

## 2017-11-08 DIAGNOSIS — Z7952 Long term (current) use of systemic steroids: Secondary | ICD-10-CM

## 2017-11-08 DIAGNOSIS — M3500 Sicca syndrome, unspecified: Secondary | ICD-10-CM | POA: Diagnosis present

## 2017-11-08 DIAGNOSIS — R778 Other specified abnormalities of plasma proteins: Secondary | ICD-10-CM

## 2017-11-08 DIAGNOSIS — M810 Age-related osteoporosis without current pathological fracture: Secondary | ICD-10-CM | POA: Diagnosis present

## 2017-11-08 DIAGNOSIS — R6510 Systemic inflammatory response syndrome (SIRS) of non-infectious origin without acute organ dysfunction: Secondary | ICD-10-CM

## 2017-11-08 DIAGNOSIS — Z951 Presence of aortocoronary bypass graft: Secondary | ICD-10-CM

## 2017-11-08 DIAGNOSIS — M069 Rheumatoid arthritis, unspecified: Secondary | ICD-10-CM | POA: Diagnosis not present

## 2017-11-08 DIAGNOSIS — K219 Gastro-esophageal reflux disease without esophagitis: Secondary | ICD-10-CM | POA: Diagnosis present

## 2017-11-08 DIAGNOSIS — R079 Chest pain, unspecified: Secondary | ICD-10-CM | POA: Diagnosis not present

## 2017-11-08 DIAGNOSIS — Z87891 Personal history of nicotine dependence: Secondary | ICD-10-CM

## 2017-11-08 DIAGNOSIS — R5081 Fever presenting with conditions classified elsewhere: Secondary | ICD-10-CM | POA: Diagnosis present

## 2017-11-08 HISTORY — DX: Sepsis, unspecified organism: A41.9

## 2017-11-08 HISTORY — DX: Severe sepsis with septic shock: R65.21

## 2017-11-08 LAB — BASIC METABOLIC PANEL
Anion gap: 11 (ref 5–15)
BUN: 15 mg/dL (ref 6–20)
CALCIUM: 9 mg/dL (ref 8.9–10.3)
CHLORIDE: 98 mmol/L — AB (ref 101–111)
CO2: 26 mmol/L (ref 22–32)
CREATININE: 0.64 mg/dL (ref 0.61–1.24)
GFR calc non Af Amer: >60 mL/min (ref >60)
Glucose, Bld: 107 mg/dL — ABNORMAL HIGH (ref 65–99)
Potassium: 3.8 mmol/L (ref 3.5–5.1)
SODIUM: 135 mmol/L (ref 135–145)

## 2017-11-08 LAB — HEPATIC FUNCTION PANEL
ALK PHOS: 64 U/L (ref 38–126)
ALT: 26 U/L (ref 17–63)
AST: 30 U/L (ref 15–41)
Albumin: 3.8 g/dL (ref 3.5–5.0)
BILIRUBIN DIRECT: 0.2 mg/dL (ref 0.1–0.5)
Indirect Bilirubin: 0.9 mg/dL (ref 0.3–0.9)
Total Bilirubin: 1.1 mg/dL (ref 0.3–1.2)
Total Protein: 7.3 g/dL (ref 6.5–8.1)

## 2017-11-08 LAB — CBC WITH DIFFERENTIAL/PLATELET
BASOS PCT: 0 %
Basophils Absolute: 0 10*3/uL (ref 0.0–0.1)
EOS ABS: 0 10*3/uL (ref 0.0–0.7)
Eosinophils Relative: 1 %
HCT: 41.1 % (ref 39.0–52.0)
Hemoglobin: 13.7 g/dL (ref 13.0–17.0)
LYMPHS ABS: 1.9 10*3/uL (ref 0.7–4.0)
Lymphocytes Relative: 50 %
MCH: 29.3 pg (ref 26.0–34.0)
MCHC: 33.3 g/dL (ref 30.0–36.0)
MCV: 87.8 fL (ref 78.0–100.0)
MONO ABS: 0.8 10*3/uL (ref 0.1–1.0)
MONOS PCT: 21 %
Neutro Abs: 1.1 10*3/uL — ABNORMAL LOW (ref 1.7–7.7)
Neutrophils Relative %: 28 %
Platelets: 170 10*3/uL (ref 150–400)
RBC: 4.68 MIL/uL (ref 4.22–5.81)
RDW: 17.5 % — AB (ref 11.5–15.5)
WBC: 3.9 10*3/uL — ABNORMAL LOW (ref 4.0–10.5)

## 2017-11-08 LAB — TROPONIN I: TROPONIN I: 0.04 ng/mL — AB (ref <0.03)

## 2017-11-08 LAB — I-STAT CG4 LACTIC ACID, ED: LACTIC ACID, VENOUS: 1.81 mmol/L (ref 0.5–1.9)

## 2017-11-08 MED ORDER — ONDANSETRON HCL 4 MG/2ML IJ SOLN
4.0000 mg | Freq: Once | INTRAMUSCULAR | Status: AC
  Administered 2017-11-08: 4 mg via INTRAVENOUS
  Filled 2017-11-08: qty 2

## 2017-11-08 MED ORDER — SODIUM CHLORIDE 0.9 % IV BOLUS (SEPSIS)
1000.0000 mL | Freq: Once | INTRAVENOUS | Status: AC
  Administered 2017-11-08: 1000 mL via INTRAVENOUS

## 2017-11-08 MED ORDER — ACETAMINOPHEN 325 MG PO TABS
650.0000 mg | ORAL_TABLET | Freq: Once | ORAL | Status: AC
  Administered 2017-11-08: 650 mg via ORAL
  Filled 2017-11-08: qty 2

## 2017-11-08 MED ORDER — MORPHINE SULFATE (PF) 4 MG/ML IV SOLN
4.0000 mg | Freq: Once | INTRAVENOUS | Status: AC
  Administered 2017-11-08: 4 mg via INTRAVENOUS
  Filled 2017-11-08: qty 1

## 2017-11-08 NOTE — ED Triage Notes (Signed)
Pt has Hx of RA. Reports pain flare up since yesterday

## 2017-11-08 NOTE — ED Provider Notes (Signed)
TIME SEEN: 11:28 PM  CHIEF COMPLAINT: Chills, body aches, joint pain  HPI: Patient is a 67 year old male with history of CAD status post CABG, COPD on home oxygen, hypertension, diabetes, psoriasis and rheumatoid arthritis on prednisone daily who presents to the emergency department feeling like he is having a rheumatoid arthritis flare.  He states he is hurting all over with body aches and joint pain.  No redness or swelling noted to any joint.  States symptoms started yesterday but had chills tonight.  No known fever at home.  No headache, neck pain or neck stiffness, cough, sore throat, vomiting, diarrhea, rash or other lesions.  He does state that he had an episode of chest tightness in the center of his chest around 8 PM tonight that lasted for several minutes while at rest and then resolved.  Feels similar to his previous anginal equivalent.  No chest pain currently.  Has chronic shortness of breath which is unchanged.  He is on prednisone 15 mg daily.  No longer on methotrexate as he had sepsis and required intubation in December 2018.  He did have a flu shot this year as well as a pneumonia vaccination.  No known sick contacts or travel.  ROS: See HPI Constitutional:  fever  Eyes: no drainage  ENT: no runny nose   Cardiovascular:  chest pain  Resp: no SOB  GI: no vomiting GU: no dysuria Integumentary: no rash  Allergy: no hives  Musculoskeletal: no leg swelling  Neurological: no slurred speech ROS otherwise negative  PAST MEDICAL HISTORY/PAST SURGICAL HISTORY:  Past Medical History:  Diagnosis Date  . CAD (coronary artery disease)    MI 2007, CABG  . Cataracts, bilateral   . Complication of anesthesia    DIFFICULTY AFTER CABG HAD CONFUSION POST OP  . COPD (chronic obstructive pulmonary disease) (HCC)   . Diabetes mellitus    Dx 07-2008 A1C 6.2  . Esophageal dysmotility   . Fatty liver 10/11/09  . Gastroesophageal reflux disease   . Gastroparesis   . Hiatal hernia   .  Hypertension   . Neuropathy    (related to RA per neuro note 05-2011)  . Onychomycosis   . Osteoporosis    per DEXA 05-2008  . Oxygen dependent    3l  . Peripheral polyneuropathy    Severe, causing problems with his feet, see surgeries  . Psoriasis   . Pulmonary nodules   . Rheumatoid arthritis [facility])    [facility]    MEDICATIONS:  Prior to Admission medications   Medication Sig Start Date End Date Taking? Authorizing Provider  acetaminophen (TYLENOL) 500 MG tablet Take 500 mg by mouth every 6 (six) hours as needed for mild pain or headache. Max of 2000 mg/day    [provider]  albuterol (PROAIR HFA) 108 (90 Base) MCG/ACT inhaler Inhale 2 puffs into the lungs every 4 (four) hours as needed for wheezing or shortness of breath. 02/04/17   [name], MD  Ascorbic Acid (VITAMIN C) 500 MG tablet Take 500 mg by mouth daily.      [provider]  aspirin EC 81 MG tablet Take 81 mg by mouth daily.    [provider]  atorvastatin (LIPITOR) 80 MG tablet TAKE 1 TABLET BY MOUTH DAILY 11/03/17   Rollene Rotunda, MD  B Complex-C (SUPER B COMPLEX PO) Take 1 tablet by mouth daily.     [provider]  Carbonyl Iron (PERFECT IRON PO) Take 1 tablet by mouth every  morning.     [provider]  dextromethorphan-guaiFENesin (MUCINEX DM) 30-600 MG 12hr tablet Take 1 tablet by mouth 2 (two) times daily. 08/22/17   Drema Dallas, MD  folic acid (FOLVITE) 800 MCG tablet Take 800 mcg by mouth daily.    [provider]  furosemide (LASIX) 20 MG tablet Take 3 tablets (60 mg total) by mouth daily. 09/23/17   Rollene Rotunda, MD  glucose blood (ONETOUCH VERIO) test strip Check blood sugars once daily 11/04/17   Wanda Plump, MD  ipratropium-albuterol (DUONEB) 0.5-2.5 (3) MG/3ML SOLN Take 3 mLs by nebulization every 4 (four) hours as needed. 08/22/17   Drema Dallas, MD  KLOR-CON 10 10 MEQ tablet TAKE 4 TABLETS BY MOUTH EVERY MORNING AND TAKE 2 TABLETS  EVERY EVENING 10/31/17   Rollene Rotunda, MD  levofloxacin (LEVAQUIN) 500 MG tablet Take 1 tablet (500 mg total) by mouth daily. 10/27/17   Horton, Mayer Masker, MD  Loteprednol Etabonate (LOTEMAX) 0.5 % GEL Place 1 drop into both eyes every morning. daily 07/01/14   [provider]  Melatonin 1 MG CAPS Take 4 mg by mouth as needed (Sleep).    [provider]  Multiple Vitamins-Minerals (VITEYES AREDS ADVANCED PO) Take 1 tablet by mouth daily.    [provider]  nitroGLYCERIN (NITROSTAT) 0.4 MG SL tablet Place 1 tablet (0.4 mg total) under the tongue every 5 (five) minutes as needed for chest pain (up to 3 doses for severe chest pain. If no relief after 3rd dose, proceed to the ED for an evaluation). 10/04/16   Rollene Rotunda, MD  ONE TOUCH LANCETS MISC Check blood sugar once daily 11/04/17   Wanda Plump, MD  oseltamivir (TAMIFLU) 75 MG capsule Take 1 capsule (75 mg total) by mouth every 12 (twelve) hours. 10/27/17   Horton, Mayer Masker, MD  oxyCODONE-acetaminophen (PERCOCET) 7.5-325 MG per tablet Take 1 tablet by mouth 3 (three) times daily. 3 times a day    [provider]  OXYGEN Inhale 3 L into the lungs continuous.    [provider]  pantoprazole (PROTONIX) 40 MG tablet Take 1 tablet (40 mg total) by mouth 2 (two) times daily. 05/16/17   Armbruster, Willaim Rayas, MD  predniSONE (DELTASONE) 10 MG tablet Take 3 tablets (30 mg total) by mouth daily with breakfast. Take 30mg  daily until your FU with Rheumatologist Next week Patient taking differently: Take 15 mg by mouth daily with breakfast. Take 30mg  daily until your FU with Rheumatologist Next week 09/04/17   Zannie Cove, MD  pregabalin (LYRICA) 75 MG capsule Take 75 mg by mouth 2 (two) times daily.    [provider]  PRESCRIPTION MEDICATION Apply 1 application topically as needed (psoriasis on scalp).    [provider]  PRESCRIPTION MEDICATION Apply 1 application topically as needed (psoriasis  on face).    [provider]  PRESCRIPTION MEDICATION Apply 1 application topically as needed (psoriasis on body).    [provider]  sodium chloride (OCEAN) 0.65 % SOLN nasal spray Place 1 spray into both nostrils as needed for congestion.    [provider]  omeprazole (PRILOSEC OTC) 20 MG tablet Take 1 tablet (20 mg total) by mouth daily. 06/02/11 07/29/11  Wanda Plump, MD    ALLERGIES:  No Known Allergies  SOCIAL HISTORY:  Social History   Tobacco Use  . Smoking status: Former Smoker    Packs/day: 1.00    Years: 40.00    Pack  years: 40.00    Last attempt to quit: 08/26/2005    Years since quitting: 12.2  . Smokeless tobacco: Former Neurosurgeon    Quit date: 04/02/2006  . Tobacco comment: smoked x 40 years, 1 ppd  Substance Use Topics  . Alcohol use: No    FAMILY HISTORY: Family History  Problem Relation Age of Onset  . Heart disease Mother   . Diverticulitis Mother   . Heart disease Father   . Heart attack Father        x2  . Colon cancer Neg Hx   . Prostate cancer Neg Hx     EXAM: BP 137/81 (BP Location: Right Arm)   Pulse 90   Temp 100.2 F (37.9 C) (Oral)   Resp 16   Ht 6\' 2"  (1.88 m)   Wt 93.9 kg (207 lb)   SpO2 100%   BMI 26.58 kg/m  CONSTITUTIONAL: Alert and oriented and responds appropriately to questions.  Chronically ill-appearing, appears uncomfortable HEAD: Normocephalic EYES: Conjunctivae clear, pupils appear equal, EOMI ENT: normal nose; moist mucous membranes NECK: Supple, no meningismus, no nuchal rigidity, no LAD  CARD: RRR; S1 and S2 appreciated; no murmurs, no clicks, no rubs, no gallops RESP: Normal chest excursion without splinting or tachypnea; breath sounds clear and equal bilaterally; no wheezes, no rhonchi, no rales, no hypoxia or respiratory distress, speaking full sentences ABD/GI: Normal bowel sounds; non-distended; soft, non-tender, no rebound, no guarding, no peritoneal signs, no hepatosplenomegaly BACK:  The  back appears normal and is non-tender to palpation, there is no CVA tenderness EXT: Sequela of rheumatoid arthritis noted in his hands and feet.  There is no joint effusion noted or redness, warmth.  2+ radial and DP pulses bilaterally.  Normal ROM in all joints; no edema; normal capillary refill; no cyanosis, no calf tenderness or swelling    SKIN: Normal color for age and race; warm; no rash NEURO: Moves all extremities equally PSYCH: The patient's mood and manner are appropriate. Grooming and personal hygiene are appropriate.  MEDICAL DECISION MAKING: Patient here with fevers, body aches, joint pain.  No sign of septic arthritis.  Patient is immunocompromised and recently admitted to the hospital for sepsis.  Will obtain labs, cultures, chest x-ray, urine, flu swab.  Will treat symptoms with morphine.  He states he feels like he would improve with steroids but I feel that the risk is too high if he currently has infection.  We will also obtain cardiac labs, EKG given complaints of chest pain previously.  No chest pain currently.  ED PROGRESS: Labs show mild leukopenia with mild neutropenia.  Chest x-ray clear.  Urine shows no sign of infection.  Blood cultures and flu swab pending.  Troponin mildly positive at 0.04.  EKG shows no ischemic changes.  Given patient is immunocompromised with fever and also has an elevated troponin I have recommended admission to [facility].  His cardiologist is Dr. Antoine Poche and his PCP is Dr. Drue Novel.  Patient and wife agree with this plan.  Morphine did not help with his pain.  Will give dose of Dilaudid.    1:17 AM Discussed patient's case with hospitalist, Dr. Katrinka Blazing.  I have recommended admission and patient (and family if present) agree with this plan. Admitting physician will place admission orders.   I reviewed all nursing notes, vitals, pertinent previous records, EKGs, lab and urine results, imaging (as available).    3:00 AM  Pt still chest pain-free.  Repeat  troponin is 0.04.  Given no significant change I do not feel we need emergent cardiology consultation or heparin started.  He has received aspirin.  Currently awaiting a telemetry bed.   EKG Interpretation  Date/Time:  Saturday November 08 2017 23:35:06 EDT Ventricular Rate:  94 PR Interval:    QRS Duration: 85 QT Interval:  354 QTC Calculation: 443 R Axis:   28 Text Interpretation:  Sinus rhythm Ventricular premature complex Probable left atrial enlargement Low voltage, precordial leads Anteroseptal infarct, old Baseline wander in lead(s) II III aVF No significant change since last tracing Confirmed by Giomar Gusler, Baxter Hire [phone]) on 11/09/2017 12:06:52 AM           Lyndsey Demos, Layla Maw, DO 11/09/17 0301

## 2017-11-08 NOTE — ED Notes (Signed)
ED Provider at bedside. 

## 2017-11-08 NOTE — ED Notes (Signed)
EMT will attempt EKG once EDP is done with assessment

## 2017-11-09 DIAGNOSIS — J449 Chronic obstructive pulmonary disease, unspecified: Secondary | ICD-10-CM | POA: Diagnosis not present

## 2017-11-09 DIAGNOSIS — I110 Hypertensive heart disease with heart failure: Secondary | ICD-10-CM | POA: Diagnosis present

## 2017-11-09 DIAGNOSIS — I5032 Chronic diastolic (congestive) heart failure: Secondary | ICD-10-CM | POA: Diagnosis not present

## 2017-11-09 DIAGNOSIS — K219 Gastro-esophageal reflux disease without esophagitis: Secondary | ICD-10-CM | POA: Diagnosis not present

## 2017-11-09 DIAGNOSIS — K3184 Gastroparesis: Secondary | ICD-10-CM | POA: Diagnosis present

## 2017-11-09 DIAGNOSIS — E1142 Type 2 diabetes mellitus with diabetic polyneuropathy: Secondary | ICD-10-CM | POA: Diagnosis present

## 2017-11-09 DIAGNOSIS — Z951 Presence of aortocoronary bypass graft: Secondary | ICD-10-CM | POA: Diagnosis not present

## 2017-11-09 DIAGNOSIS — D709 Neutropenia, unspecified: Secondary | ICD-10-CM | POA: Diagnosis present

## 2017-11-09 DIAGNOSIS — Z87891 Personal history of nicotine dependence: Secondary | ICD-10-CM | POA: Diagnosis not present

## 2017-11-09 DIAGNOSIS — J9611 Chronic respiratory failure with hypoxia: Secondary | ICD-10-CM | POA: Diagnosis not present

## 2017-11-09 DIAGNOSIS — K760 Fatty (change of) liver, not elsewhere classified: Secondary | ICD-10-CM | POA: Diagnosis present

## 2017-11-09 DIAGNOSIS — M3500 Sicca syndrome, unspecified: Secondary | ICD-10-CM | POA: Diagnosis present

## 2017-11-09 DIAGNOSIS — I2510 Atherosclerotic heart disease of native coronary artery without angina pectoris: Secondary | ICD-10-CM | POA: Diagnosis not present

## 2017-11-09 DIAGNOSIS — R509 Fever, unspecified: Secondary | ICD-10-CM | POA: Diagnosis present

## 2017-11-09 DIAGNOSIS — I252 Old myocardial infarction: Secondary | ICD-10-CM | POA: Diagnosis not present

## 2017-11-09 DIAGNOSIS — R5081 Fever presenting with conditions classified elsewhere: Secondary | ICD-10-CM | POA: Diagnosis not present

## 2017-11-09 DIAGNOSIS — M810 Age-related osteoporosis without current pathological fracture: Secondary | ICD-10-CM | POA: Diagnosis present

## 2017-11-09 DIAGNOSIS — R0789 Other chest pain: Secondary | ICD-10-CM | POA: Diagnosis present

## 2017-11-09 DIAGNOSIS — E1143 Type 2 diabetes mellitus with diabetic autonomic (poly)neuropathy: Secondary | ICD-10-CM | POA: Diagnosis present

## 2017-11-09 DIAGNOSIS — D704 Cyclic neutropenia: Secondary | ICD-10-CM | POA: Diagnosis not present

## 2017-11-09 DIAGNOSIS — Z7952 Long term (current) use of systemic steroids: Secondary | ICD-10-CM | POA: Diagnosis not present

## 2017-11-09 DIAGNOSIS — R079 Chest pain, unspecified: Secondary | ICD-10-CM | POA: Diagnosis not present

## 2017-11-09 DIAGNOSIS — M069 Rheumatoid arthritis, unspecified: Secondary | ICD-10-CM | POA: Diagnosis not present

## 2017-11-09 DIAGNOSIS — Z9981 Dependence on supplemental oxygen: Secondary | ICD-10-CM | POA: Diagnosis not present

## 2017-11-09 DIAGNOSIS — R6510 Systemic inflammatory response syndrome (SIRS) of non-infectious origin without acute organ dysfunction: Secondary | ICD-10-CM | POA: Diagnosis not present

## 2017-11-09 DIAGNOSIS — Z66 Do not resuscitate: Secondary | ICD-10-CM | POA: Diagnosis present

## 2017-11-09 LAB — CBC WITH DIFFERENTIAL/PLATELET
BASOS ABS: 0 10*3/uL (ref 0.0–0.1)
Basophils Relative: 0 %
EOS PCT: 0 %
Eosinophils Absolute: 0 10*3/uL (ref 0.0–0.7)
HCT: 39.5 % (ref 39.0–52.0)
Hemoglobin: 13.1 g/dL (ref 13.0–17.0)
LYMPHS ABS: 2.3 10*3/uL (ref 0.7–4.0)
LYMPHS PCT: 56 %
MCH: 29.2 pg (ref 26.0–34.0)
MCHC: 33.2 g/dL (ref 30.0–36.0)
MCV: 88.2 fL (ref 78.0–100.0)
MONO ABS: 0.6 10*3/uL (ref 0.1–1.0)
Monocytes Relative: 15 %
Neutro Abs: 1.2 10*3/uL — ABNORMAL LOW (ref 1.7–7.7)
Neutrophils Relative %: 29 %
PLATELETS: 173 10*3/uL (ref 150–400)
RBC: 4.48 MIL/uL (ref 4.22–5.81)
RDW: 17.4 % — AB (ref 11.5–15.5)
WBC: 4.1 10*3/uL (ref 4.0–10.5)

## 2017-11-09 LAB — SEDIMENTATION RATE: Sed Rate: 74 mm/hr — ABNORMAL HIGH (ref 0–16)

## 2017-11-09 LAB — URINALYSIS, ROUTINE W REFLEX MICROSCOPIC
Bilirubin Urine: NEGATIVE
GLUCOSE, UA: NEGATIVE mg/dL
Hgb urine dipstick: NEGATIVE
KETONES UR: NEGATIVE mg/dL
LEUKOCYTES UA: NEGATIVE
Nitrite: NEGATIVE
PH: 6.5 (ref 5.0–8.0)
Protein, ur: NEGATIVE mg/dL
SPECIFIC GRAVITY, URINE: 1.01 (ref 1.005–1.030)

## 2017-11-09 LAB — TROPONIN I
TROPONIN I: 0.04 ng/mL — AB (ref <0.03)
Troponin I: 0.04 ng/mL (ref <0.03)
Troponin I: 0.06 ng/mL (ref <0.03)

## 2017-11-09 LAB — VITAMIN B12: VITAMIN B 12: 1014 pg/mL — AB (ref 180–914)

## 2017-11-09 LAB — CBG MONITORING, ED: Glucose-Capillary: 113 mg/dL — ABNORMAL HIGH (ref 65–99)

## 2017-11-09 LAB — INFLUENZA PANEL BY PCR (TYPE A & B)
INFLAPCR: NEGATIVE
INFLBPCR: NEGATIVE

## 2017-11-09 LAB — C-REACTIVE PROTEIN: CRP: 23.6 mg/dL — AB (ref <1.0)

## 2017-11-09 MED ORDER — ATORVASTATIN CALCIUM 80 MG PO TABS
80.0000 mg | ORAL_TABLET | Freq: Every day | ORAL | Status: DC
  Administered 2017-11-10 – 2017-11-12 (×3): 80 mg via ORAL
  Filled 2017-11-09 (×4): qty 1

## 2017-11-09 MED ORDER — POTASSIUM CHLORIDE CRYS ER 20 MEQ PO TBCR: 20.0000 meq | EXTENDED_RELEASE_TABLET | Freq: Every day | ORAL | Status: DC

## 2017-11-09 MED ORDER — HYDROMORPHONE HCL 1 MG/ML IJ SOLN
0.5000 mg | Freq: Once | INTRAMUSCULAR | Status: AC
  Administered 2017-11-09: 0.5 mg via INTRAVENOUS
  Filled 2017-11-09: qty 1

## 2017-11-09 MED ORDER — MELATONIN 3 MG PO TABS
4.0000 mg | ORAL_TABLET | ORAL | Status: DC | PRN
  Administered 2017-11-11: 4.5 mg via ORAL
  Filled 2017-11-09: qty 1.5

## 2017-11-09 MED ORDER — PREDNISONE 5 MG PO TABS
15.0000 mg | ORAL_TABLET | Freq: Every day | ORAL | Status: DC
  Administered 2017-11-09 – 2017-11-11 (×3): 15 mg via ORAL
  Filled 2017-11-09 (×2): qty 1
  Filled 2017-11-09: qty 2

## 2017-11-09 MED ORDER — OXYCODONE-ACETAMINOPHEN 5-325 MG PO TABS
ORAL_TABLET | ORAL | Status: AC
  Administered 2017-11-09: 1
  Filled 2017-11-09: qty 1

## 2017-11-09 MED ORDER — NITROGLYCERIN 0.4 MG SL SUBL: 0.4000 mg | SUBLINGUAL_TABLET | SUBLINGUAL | Status: DC | PRN

## 2017-11-09 MED ORDER — FOLIC ACID 1 MG PO TABS
1000.0000 ug | ORAL_TABLET | Freq: Every day | ORAL | Status: DC
Start: 2017-11-09 — End: 2017-11-12
  Administered 2017-11-09 – 2017-11-12 (×4): 1 mg via ORAL
  Filled 2017-11-09 (×4): qty 1

## 2017-11-09 MED ORDER — POTASSIUM CHLORIDE CRYS ER 20 MEQ PO TBCR
EXTENDED_RELEASE_TABLET | ORAL | Status: AC
  Administered 2017-11-09: 40 meq via ORAL
  Filled 2017-11-09: qty 2

## 2017-11-09 MED ORDER — VANCOMYCIN HCL IN DEXTROSE 1-5 GM/200ML-% IV SOLN
1000.0000 mg | Freq: Three times a day (TID) | INTRAVENOUS | Status: DC
  Administered 2017-11-09 (×2): 1000 mg via INTRAVENOUS
  Filled 2017-11-09 (×4): qty 200

## 2017-11-09 MED ORDER — SODIUM CHLORIDE 0.9% FLUSH
3.0000 mL | Freq: Two times a day (BID) | INTRAVENOUS | Status: DC
  Administered 2017-11-09 – 2017-11-12 (×4): 3 mL via INTRAVENOUS

## 2017-11-09 MED ORDER — IPRATROPIUM-ALBUTEROL 0.5-2.5 (3) MG/3ML IN SOLN: 3.0000 mL | RESPIRATORY_TRACT | Status: DC | PRN

## 2017-11-09 MED ORDER — SALINE SPRAY 0.65 % NA SOLN
1.0000 | NASAL | Status: DC | PRN
  Filled 2017-11-09: qty 44

## 2017-11-09 MED ORDER — POTASSIUM CHLORIDE ER 10 MEQ PO TBCR
40.0000 meq | EXTENDED_RELEASE_TABLET | ORAL | Status: DC
  Administered 2017-11-09: 40 meq via ORAL
  Filled 2017-11-09 (×2): qty 4

## 2017-11-09 MED ORDER — PANTOPRAZOLE SODIUM 40 MG PO TBEC
40.0000 mg | DELAYED_RELEASE_TABLET | Freq: Two times a day (BID) | ORAL | Status: DC
Start: 2017-11-09 — End: 2017-11-12
  Administered 2017-11-09 – 2017-11-12 (×8): 40 mg via ORAL
  Filled 2017-11-09 (×8): qty 1

## 2017-11-09 MED ORDER — PIPERACILLIN-TAZOBACTAM 3.375 G IVPB 30 MIN
3.3750 g | Freq: Four times a day (QID) | INTRAVENOUS | Status: DC
Start: 2017-11-09 — End: 2017-11-10
  Administered 2017-11-09 – 2017-11-10 (×4): 3.375 g via INTRAVENOUS
  Filled 2017-11-09 (×6): qty 50

## 2017-11-09 MED ORDER — ASPIRIN 81 MG PO CHEW
324.0000 mg | CHEWABLE_TABLET | Freq: Once | ORAL | Status: AC
  Administered 2017-11-09: 324 mg via ORAL
  Filled 2017-11-09: qty 4

## 2017-11-09 MED ORDER — ALBUTEROL SULFATE HFA 108 (90 BASE) MCG/ACT IN AERS: 2.0000 | INHALATION_SPRAY | RESPIRATORY_TRACT | Status: DC | PRN

## 2017-11-09 MED ORDER — PIPERACILLIN-TAZOBACTAM 3.375 G IVPB 30 MIN
3.3750 g | Freq: Once | INTRAVENOUS | Status: AC
  Administered 2017-11-09: 3.375 g via INTRAVENOUS
  Filled 2017-11-09 (×2): qty 50

## 2017-11-09 MED ORDER — ASPIRIN EC 81 MG PO TBEC
81.0000 mg | DELAYED_RELEASE_TABLET | Freq: Every day | ORAL | Status: DC
  Administered 2017-11-09 – 2017-11-12 (×4): 81 mg via ORAL
  Filled 2017-11-09 (×4): qty 1

## 2017-11-09 MED ORDER — FUROSEMIDE 40 MG PO TABS
60.0000 mg | ORAL_TABLET | Freq: Every day | ORAL | Status: DC
  Administered 2017-11-09: 60 mg via ORAL
  Filled 2017-11-09: qty 1

## 2017-11-09 MED ORDER — POLYETHYLENE GLYCOL 3350 17 G PO PACK
17.0000 g | PACK | Freq: Every day | ORAL | Status: DC
  Filled 2017-11-09 (×3): qty 1

## 2017-11-09 MED ORDER — VANCOMYCIN HCL IN DEXTROSE 1-5 GM/200ML-% IV SOLN
1000.0000 mg | Freq: Once | INTRAVENOUS | Status: AC
  Administered 2017-11-09: 1000 mg via INTRAVENOUS
  Filled 2017-11-09: qty 200

## 2017-11-09 MED ORDER — ENOXAPARIN SODIUM 40 MG/0.4ML ~~LOC~~ SOLN
40.0000 mg | SUBCUTANEOUS | Status: DC
  Administered 2017-11-09 – 2017-11-11 (×3): 40 mg via SUBCUTANEOUS
  Filled 2017-11-09 (×3): qty 0.4

## 2017-11-09 MED ORDER — ALBUTEROL SULFATE (2.5 MG/3ML) 0.083% IN NEBU: 2.5000 mg | INHALATION_SOLUTION | RESPIRATORY_TRACT | Status: DC | PRN

## 2017-11-09 MED ORDER — PREGABALIN 75 MG PO CAPS
75.0000 mg | ORAL_CAPSULE | Freq: Two times a day (BID) | ORAL | Status: DC
  Administered 2017-11-09 – 2017-11-12 (×6): 75 mg via ORAL
  Filled 2017-11-09 (×7): qty 1

## 2017-11-09 MED ORDER — OXYCODONE-ACETAMINOPHEN 7.5-325 MG PO TABS
1.0000 | ORAL_TABLET | Freq: Three times a day (TID) | ORAL | Status: DC
  Administered 2017-11-09: 1 via ORAL
  Filled 2017-11-09 (×2): qty 1

## 2017-11-09 MED ORDER — OXYCODONE HCL 5 MG PO TABS
5.0000 mg | ORAL_TABLET | ORAL | Status: DC | PRN
  Administered 2017-11-10: 10 mg via ORAL
  Administered 2017-11-10: 5 mg via ORAL
  Administered 2017-11-10 – 2017-11-12 (×7): 10 mg via ORAL
  Filled 2017-11-09: qty 1
  Filled 2017-11-09 (×8): qty 2

## 2017-11-09 NOTE — ED Notes (Signed)
Date and time results received: 11/09/17 0840 (use smartphrase ".now" to insert current time)  Test: Trp Critical Value: 0.04 Name of Provider Notified: Mabe  Orders Received? Or Actions Taken?: no orders given

## 2017-11-09 NOTE — ED Notes (Signed)
Alert, NAD, calm, interactive, resps e/u, skin W&D, no changes, VSS, wife at [facility].

## 2017-11-09 NOTE — H&P (Signed)
History and Physical   Joseph [name] QBH:[id number] DOB: August 07, 1951 DOA: 11/08/2017  PCP: Joseph Branch, MD  Chief Complaint: joint pain  HPI: this 67 year old man with medical problems including coronary artery disease status post four-vessel CABG in 0973, chronic diastolic heart failure, history of a flutter status post ablation in summer of 2018 now in sinus rhythm, chronic hypoxic respiratory failure secondary to COPD on 3 L nasal cannula chronically, rheumatoid arthritis on chronic prednisone, GERD, peripheral neuropathy, and recent multi-month history of neutropenia presented to [facility] with acute onset of generalized joint pain.  The patient reports developing symptoms on 11/07/2017, these included generalized joint pain that progressed to becoming severe the following day, he was unable to move and therefore EMS was called. He denies any myalgias. He reports being hospitalized 4 times in the past 12 months due to concerns about infection. His methotrexate was stopped he's been maintained on prednisone, current dose of 15 mg daily.  He's been evaluated by hematology oncology in the outpatient setting, no bone marrow biopsy performed. His longtime rheumatologist is Rivadeneira, Cameron Proud, MD within the [facility] health system. He reports being on etanercept trial in the past which paradoxically worsened his symptoms.  While at the [facility] prior to admission, he experienced some poorly localized generalized chest discomfort, described as achy, did not radiate, lasted for 1 minute, not associated with other symptoms such as nausea, vomiting, diaphoresis, shortness of breath. He reports having a daily productive cough related to his COPD that has improved recently with the use of chest physiotherapy.  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 He has had physical therapy and respiratory therapy in the home twice a week to optimize his functional status.  He specifically denies any diarrhea, rash, headache, vision changes, hemoptysis, or recent bleeding.  No dysuria or fevers at home. He reports that ever since coming off methotrexate his average pain level has been a 3 out of 10, over the past 24-48 hours dispense 7 out of 10. At home he takes acetaminophen/oxycodone accommodation product of 7.5-325 mg up to 5 times a day.  Regarding his peripheral neuropathy, he reports formerly it was only in the lower extremities, this improved however now he has symptoms in his bilateral upper extremities.  ED Course: this course prior to admission is remarkable for the following. Vital signs showed fever to 100.5 Fahrenheit, heart rate of 90. Normal respiratory rate. Normal O2 sat on his chronic 3 L of oxygen. Labs remarkable for ANC of 1100, total white count 3.9. Hemoglobin 13.7. CMP unremarkable. Troponin was 0.04 for three consecutive readings. Blood cultures obtained. Flu rapid test was negative. Chest x-ray without acute findings. EKG did not show any acute ST changes. Urinalysis was unremarkable.  He was treated with IV opioids, aspirin 324, and broad-spectrum antimicrobials including vancomycin and ampicillin tazobactam. He was given 1 L of isotonic fluid. Accepted the hospital medicine service for further management.  Review of Systems: A complete ROS was obtained; pertinent positives negatives are denoted in the HPI. Otherwise, all systems are negative.   Past Medical History:  Diagnosis Date  . CAD (coronary artery disease)  MI 2007, CABG  . Cataracts, bilateral   . Complication of anesthesia    DIFFICULTY AFTER CABG HAD CONFUSION POST OP    . COPD (chronic obstructive pulmonary disease) ([place])   . Diabetes mellitus    Dx 07-2008 A1C 6.2  . Esophageal dysmotility   . Fatty liver 10/11/09  . Gastroesophageal reflux disease   . Gastroparesis   . Hiatal hernia   . Hypertension   . Neuropathy    (related to RA per neuro note 05-2011)  . Onychomycosis   . Osteoporosis    per DEXA 05-2008  . Oxygen dependent    3l  . Peripheral polyneuropathy    Severe, causing problems with his feet, see surgeries  . Psoriasis   . Pulmonary nodules   . Rheumatoid arthritis ([place])    [facility]   Social History   Socioeconomic History  . Marital status: Married    Spouse name: Not on file  . Number of children: 0  . Years of education: Not on file  . Highest education level: Not on file  Social Needs  . Financial resource strain: Not on file  . Food insecurity - worry: Not on file  . Food insecurity - inability: Not on file  . Transportation needs - medical: Not on file  . Transportation needs - non-medical: Not on file  Occupational History  . Occupation: Disabled-retired    Fish farm manager: UNEMPLOYED  Tobacco Use  . Smoking status: Former Smoker    Packs/day: 1.00    Years: 40.00    Pack years: 40.00    Last attempt to quit: 08/26/2005    Years since quitting: 12.2  . Smokeless tobacco: Former Systems developer    Quit date: 04/02/2006  . Tobacco comment: smoked x 40 years, 1 ppd  Substance and Sexual Activity  . Alcohol use: No  . Drug use: No  . Sexual activity: Not on file  Other Topics Concern  . Not on file  Social History Narrative   Household- pt and wife   Family History  Problem Relation Age of Onset  . Heart disease Mother   . Diverticulitis Mother   . Heart disease Father   . Heart attack Father        x2  . Joseph cancer Neg Hx   . Prostate cancer Neg Hx     Physical Exam: Vitals:   11/09/17 1712 11/09/17 1714 11/09/17 1835 11/09/17 1846  BP: 117/64  119/75   Pulse: 93  82 95  Resp: '17  17 19  ' Temp:  98.1  F (36.7 C) 98 F (36.7 C)   TempSrc:  Oral Oral   SpO2: 98%  98% 96%  Weight:      Height:       General: Appears calm and comfortable, white man. ENT: Grossly normal hearing, MMM.  No oral lesions. Cardiovascular: RRR. No M/R/G. No LE edema.  Respiratory: CTA bilaterally with mildly reduced breath sounds. No wheezes or crackles. Normal respiratory effort. Breathing 3L [place] O2. Abdomen: Soft, non-tender. No rebound or guarding. Skin: No rash or induration seen on exam. Musculoskeletal: Grossly normal tone BUE/BLE. Bilateral hands with severe joint deformity, unable to fully open.   No erythema of hands, elbows, knees appreciated. No frank synovitis. Psychiatric: Grossly normal mood and affect. Neurologic: Moves all extremities in coordinated fashion. Answering questions appropriately.  I have personally reviewed the following labs, culture data, and imaging studies.  Assessment/Plan:  #Neutropenic fever #Sepsis, possible Course: on admission, presenting symptom  of generalized joint pain; ANC of 1100 in setting of multi-month hx of mild neutropenia (ANC fluctuating close to 1000), temp on admission was 100.5, HR 90, and WBC < 4,000); no infectious source identified; however, due to concern for immunosuppression, received empiric vancomycin + piperacillin-tazobactam. Assessment: hemodynamically stable, no growth on blood cultures to date, possible that fever was from non-infectious cause - possibly related to inflammatory process (ie RA flare); while technically neutropenic, not severe (ANC < 500), no clear infectious sources evident. Uncertain exact cause of mild neutropenia, has been some time since MTX, had normal spleen on CT imaging in 07/2017 (less likely Felty syndrome). Other counts (hgb and plts) appear preserved. Plan: will stop vancomycin for now given hemodynamic stability, continue empiric piperacillin-tazobactam and follow up micro data.  Will obtain ESR, CRP to evaluate for  generalized inflammatory process.  RVP to eval for other infectious pathogenic culprits. Continue folic acid for bone marrow support. Trend CBC with diff to eval ANC trend. Checking B12 as potential nutritional cause.  #Other problems -Chest pain: transient and resolved, will continue telemetry for now, repeat troponin to trend to normalization -CAD: continue home ASA and statin -diastolic HF (preserved EF): not volume overloaded at this time; holding home furosemide (40 mg q am and 20 mg q pm) in setting of concerns for sepsis; reports dry wt as 206# - re-evaluate and consider re-start in 11/10/2017 -Chronic hypoxic respiratory failure and COPD: continue 3L [place] O2, continue duonebs prn -RA and sicca syndrome: continue prednisone 15 mg po daily; consider involving rheumatology consult team if infectious work up proves to be negative to optimize management. -Peripheral neuropathy: continue Lyrica -Pain control: oxycodone 5-10 mg as needed for pain; poly glycol as needed for constipation  DVT prophylaxis: Subq Lovenox Code Status: Does not want CPR, but would want to be intubated in event of respiratory failure Disposition Plan: Anticipate D/C home in 2-5 days Consults called: none Admission status: admit to hospital medicine   Cheri Rous, MD [facility] OOIL:[phone]  If 7PM-7AM, please contact night-coverage [email or web address] Password [email or web address]

## 2017-11-09 NOTE — ED Provider Notes (Signed)
2:27 PM  Pt is continuing to await bed assignment.  Nursing staff has called bed placement numerous times throughout shift today to inquire about bed placement.  I have ordered scheduled vanc and zosyn for patient so that he continues broad spectrum abx while waiting transfer.  He has also required some pain medication.  Otherwise no acute changes in status.     Phillis Haggis, MD 11/09/17 [phone]

## 2017-11-09 NOTE — Plan of Care (Signed)
Discussed plan of care with Dr. Elesa Massed. [name] is a 67 year old male with HTN, CAD followed , COPD oxygen dependent, DM type II, and RA on prednisone 15 mg daily; who presented with whole body aches and chills concern for having rheumatoid arthritis flare and CP. Vital signs revealed temperature of 100.5, and all other vital signs relatively within normal limits.  Labs revealed WBC 3.9, troponin 0.04, and lactic acid 1.89.  Influenza in process.  UA pending.  Chest x-ray showing signs of COPD without acute infiltrate. Blood cultures were obtained and patient was treated with broad-spectrum antibiotics as he is immunocompromised.  Fever of unknown origin with complaints of chest pain.  Accepted to a telemetry bed at [facility].

## 2017-11-09 NOTE — ED Notes (Signed)
Oatmeal and diet soda given

## 2017-11-10 ENCOUNTER — Other Ambulatory Visit: Payer: Self-pay

## 2017-11-10 ENCOUNTER — Encounter (HOSPITAL_COMMUNITY): Payer: Self-pay | Admitting: General Practice

## 2017-11-10 DIAGNOSIS — R6510 Systemic inflammatory response syndrome (SIRS) of non-infectious origin without acute organ dysfunction: Secondary | ICD-10-CM

## 2017-11-10 LAB — CBC WITH DIFFERENTIAL/PLATELET
BASOS ABS: 0 10*3/uL (ref 0.0–0.1)
Basophils Absolute: 0 10*3/uL (ref 0.0–0.1)
Basophils Relative: 0 %
Basophils Relative: 0 %
EOS ABS: 0 10*3/uL (ref 0.0–0.7)
Eosinophils Absolute: 0 10*3/uL (ref 0.0–0.7)
Eosinophils Relative: 1 %
Eosinophils Relative: 1 %
HCT: 35.5 % — ABNORMAL LOW (ref 39.0–52.0)
HEMATOCRIT: 40.9 % (ref 39.0–52.0)
HEMOGLOBIN: 11.9 g/dL — AB (ref 13.0–17.0)
HEMOGLOBIN: 13.6 g/dL (ref 13.0–17.0)
LYMPHS ABS: 1.1 10*3/uL (ref 0.7–4.0)
LYMPHS PCT: 43 %
Lymphocytes Relative: 35 %
Lymphs Abs: 1.3 10*3/uL (ref 0.7–4.0)
MCH: 29.4 pg (ref 26.0–34.0)
MCH: 29.4 pg (ref 26.0–34.0)
MCHC: 33.5 g/dL (ref 30.0–36.0)
MCHC: 33.3 g/dL (ref 30.0–36.0)
MCV: 87.7 fL (ref 78.0–100.0)
MCV: 88.5 fL (ref 78.0–100.0)
MONOS PCT: 16 %
Monocytes Absolute: 0.6 10*3/uL (ref 0.1–1.0)
Monocytes Absolute: 0.5 10*3/uL (ref 0.1–1.0)
Monocytes Relative: 22 %
NEUTROS ABS: 1 10*3/uL — AB (ref 1.7–7.7)
NEUTROS ABS: 1.5 10*3/uL — AB (ref 1.7–7.7)
NEUTROS PCT: 48 %
Neutrophils Relative %: 34 %
Platelets: 147 10*3/uL — ABNORMAL LOW (ref 150–400)
Platelets: 161 10*3/uL (ref 150–400)
RBC: 4.05 MIL/uL — AB (ref 4.22–5.81)
RBC: 4.62 MIL/uL (ref 4.22–5.81)
RDW: 17 % — ABNORMAL HIGH (ref 11.5–15.5)
RDW: 17.1 % — ABNORMAL HIGH (ref 11.5–15.5)
WBC: 2.9 10*3/uL — AB (ref 4.0–10.5)
WBC: 3.2 10*3/uL — ABNORMAL LOW (ref 4.0–10.5)

## 2017-11-10 LAB — COMPREHENSIVE METABOLIC PANEL
ALT: 25 U/L (ref 17–63)
AST: 28 U/L (ref 15–41)
Albumin: 2.8 g/dL — ABNORMAL LOW (ref 3.5–5.0)
Alkaline Phosphatase: 51 U/L (ref 38–126)
Anion gap: 11 (ref 5–15)
BUN: 12 mg/dL (ref 6–20)
CO2: 25 mmol/L (ref 22–32)
Calcium: 8.4 mg/dL — ABNORMAL LOW (ref 8.9–10.3)
Chloride: 98 mmol/L — ABNORMAL LOW (ref 101–111)
Creatinine, Ser: 0.72 mg/dL (ref 0.61–1.24)
Glucose, Bld: 177 mg/dL — ABNORMAL HIGH (ref 65–99)
POTASSIUM: 3.2 mmol/L — AB (ref 3.5–5.1)
SODIUM: 134 mmol/L — AB (ref 135–145)
Total Bilirubin: 1 mg/dL (ref 0.3–1.2)
Total Protein: 6.1 g/dL — ABNORMAL LOW (ref 6.5–8.1)

## 2017-11-10 LAB — RESPIRATORY PANEL BY PCR
ADENOVIRUS-RVPPCR: NOT DETECTED
Bordetella pertussis: NOT DETECTED
CORONAVIRUS 229E-RVPPCR: NOT DETECTED
CORONAVIRUS NL63-RVPPCR: NOT DETECTED
CORONAVIRUS OC43-RVPPCR: NOT DETECTED
Chlamydophila pneumoniae: NOT DETECTED
Coronavirus HKU1: NOT DETECTED
INFLUENZA A-RVPPCR: NOT DETECTED
Influenza B: NOT DETECTED
MYCOPLASMA PNEUMONIAE-RVPPCR: NOT DETECTED
Metapneumovirus: NOT DETECTED
PARAINFLUENZA VIRUS 1-RVPPCR: NOT DETECTED
PARAINFLUENZA VIRUS 4-RVPPCR: NOT DETECTED
Parainfluenza Virus 2: NOT DETECTED
Parainfluenza Virus 3: NOT DETECTED
Respiratory Syncytial Virus: NOT DETECTED
Rhinovirus / Enterovirus: NOT DETECTED

## 2017-11-10 LAB — TROPONIN I: Troponin I: 0.06 ng/mL (ref <0.03)

## 2017-11-10 MED ORDER — FLUTICASONE FUROATE-VILANTEROL 100-25 MCG/INH IN AEPB
1.0000 | INHALATION_SPRAY | Freq: Every day | RESPIRATORY_TRACT | Status: DC
  Administered 2017-11-10 – 2017-11-12 (×3): 1 via RESPIRATORY_TRACT
  Filled 2017-11-10: qty 28

## 2017-11-10 MED ORDER — POTASSIUM CHLORIDE CRYS ER 20 MEQ PO TBCR
40.0000 meq | EXTENDED_RELEASE_TABLET | Freq: Once | ORAL | Status: AC
  Administered 2017-11-10: 40 meq via ORAL

## 2017-11-10 MED ORDER — PIPERACILLIN-TAZOBACTAM 3.375 G IVPB
3.3750 g | Freq: Three times a day (TID) | INTRAVENOUS | Status: DC
  Administered 2017-11-10 – 2017-11-12 (×6): 3.375 g via INTRAVENOUS
  Filled 2017-11-10 (×7): qty 50

## 2017-11-10 MED ORDER — UMECLIDINIUM BROMIDE 62.5 MCG/INH IN AEPB
1.0000 | INHALATION_SPRAY | Freq: Every day | RESPIRATORY_TRACT | Status: DC
  Administered 2017-11-10 – 2017-11-12 (×3): 1 via RESPIRATORY_TRACT
  Filled 2017-11-10: qty 7

## 2017-11-10 NOTE — Progress Notes (Signed)
Pharmacy Consult  MD consulted Pharmacy for formulary substitution for patient's Trelegy Ellipta. Sent MD verbal orders for [facility] inhaler substitution - Breo Ellipta + Incruse Ellipta - and sent text-page informing MD consult is completed.  Babs Bertin, PharmD, BCPS Clinical Pharmacist Clinical phone for 11/10/2017 until 3:30pm: [phone] If after 3:30pm, please call main pharmacy at: [phone] 11/10/2017 12:23 PM

## 2017-11-10 NOTE — Evaluation (Signed)
Physical Therapy Evaluation Patient Details Name: Joseph [name] MRN: [id number] DOB: [birth year] Today's Date: 11/10/2017   History of Present Illness  67 year old man with medical problems including coronary artery disease status post four-vessel CABG in 2007, chronic diastolic heart failure, history of a flutter status post ablation in summer of 2018 now in sinus rhythm, chronic hypoxic respiratory failure secondary to COPD on 3 L nasal cannula chronically, rheumatoid arthritis on chronic prednisone, GERD, peripheral neuropathy, and recent multi-month history of neutropenia presented to [facility] with acute onset of generalized joint pain  Clinical Impression  Orders received for PT evaluation. Patient demonstrates deficits in functional mobility as indicated below. Will benefit from continued skilled PT to address deficits and maximize function. Will see as indicated and progress as tolerated.   Patient and wife desire to return home with HHPT services upon acute discharge.  Follow Up Recommendations Home health PT;Supervision/Assistance - 24 hour    Equipment Recommendations  None recommended by PT    Recommendations for Other Services       Precautions / Restrictions Precautions Precautions: Fall Precaution Comments: watch O2 saturations Required Braces or Orthoses: Other Brace/Splint Other Brace/Splint: wears orthotic shoes bilaterally Restrictions Weight Bearing Restrictions: No      Mobility  Bed Mobility Overal bed mobility: Needs Assistance                Transfers Overall transfer level: Needs assistance Equipment used: Bilateral platform walker Transfers: Sit to/from Stand Sit to Stand: Min assist         General transfer comment: Min assist with increased time and effort from highest elevation in bed height  Ambulation/Gait             General Gait Details: patient deferred ambulation at this time due to pain  Stairs             Wheelchair Mobility    Modified Rankin (Stroke Patients Only)       Balance Overall balance assessment: Needs assistance Sitting-balance support: Feet supported Sitting balance-Leahy Scale: Fair     Standing balance support: Bilateral upper extremity supported;During functional activity Standing balance-Leahy Scale: Poor Standing balance comment: reliance on UE support                             Pertinent Vitals/Pain Pain Assessment: 0-10 Pain Score: 6  Pain Location: Back of knees, bilateral LEs Pain Descriptors / Indicators: Sore Pain Intervention(s): Monitored during session    Home Living Family/patient expects to be discharged to:: Private residence Living Arrangements: Spouse/significant other Available Help at Discharge: Family;Available 24 hours/day Type of Home: House Home Access: Level entry     Home Layout: Two level Home Equipment: Walker - 4 wheels;Electric scooter;Wheelchair - manual;Grab bars - tub/shower;Grab bars - toilet;Shower seat - built in;Other (comment)(platform walker, supplemental O2)      Prior Function Level of Independence: Independent with assistive device(s)         Comments: pt reported that on his "good days" he can ambulate short distances in his home without use of an AD and is independent with ADLs.     Hand Dominance   Dominant Hand: Right    Extremity/Trunk Assessment   Upper Extremity Assessment Upper Extremity Assessment: Generalized weakness    Lower Extremity Assessment Lower Extremity Assessment: Generalized weakness       Communication   Communication: No difficulties  Cognition Arousal/Alertness: Awake/alert Behavior During Therapy: [facility]  for tasks assessed/performed Overall Cognitive Status: Within Functional Limits for tasks assessed                                        General Comments      Exercises     Assessment/Plan    PT Assessment Patient needs  continued PT services  PT Problem List Decreased strength;Decreased activity tolerance;Decreased balance;Decreased mobility;Impaired sensation;Pain       PT Treatment Interventions DME instruction;Gait training;Functional mobility training;Therapeutic activities;Therapeutic exercise;Balance training;Neuromuscular re-education;Patient/family education    PT Goals (Current goals can be found in the Care Plan section)  Acute Rehab PT Goals Patient Stated Goal: to get stronger PT Goal Formulation: With patient/family Time For Goal Achievement: 11/24/17 Potential to Achieve Goals: Good    Frequency Min 3X/week   Barriers to discharge        Co-evaluation               AM-PAC PT "6 Clicks" Daily Activity  Outcome Measure Difficulty turning over in bed (including adjusting bedclothes, sheets and blankets)?: A Little Difficulty moving from lying on back to sitting on the side of the bed? : A Lot Difficulty sitting down on and standing up from a chair with arms (e.g., wheelchair, bedside commode, etc,.)?: A Lot Help needed moving to and from a bed to chair (including a wheelchair)?: A Little Help needed walking in hospital room?: A Lot Help needed climbing 3-5 steps with a railing? : A Lot 6 Click Score: 14    End of Session Equipment Utilized During Treatment: Gait belt;Oxygen Activity Tolerance: Patient tolerated treatment well;Patient limited by pain Patient left: in bed;with call bell/phone within reach;with family/visitor present;Other (comment)(sitting EOB) Nurse Communication: Mobility status PT Visit Diagnosis: Muscle weakness (generalized) (M62.81);Difficulty in walking, not elsewhere classified (R26.2)    Time: 4854-6270 PT Time Calculation (min) (ACUTE ONLY): 25 min   Charges:   PT Evaluation $PT Eval Moderate Complexity: 1 Mod PT Treatments $Therapeutic Activity: 8-22 mins   PT G Codes:        Charlotte Crumb, PT DPT  Board Certified Neurologic  Specialist [phone]   Fabio Asa 11/10/2017, 2:43 PM

## 2017-11-10 NOTE — Progress Notes (Signed)
PROGRESS NOTE    KIYOSHI SCHAAB  NUU:[id number] DOB: [birth year] DOA: 11/08/2017 PCP: Colon Branch, MD   Brief Narrative: 67 year old man with medical problems including coronary artery disease status post four-vessel CABG in 3474, chronic diastolic heart failure, history of a flutter status post ablation in summer of 2018 now in sinus rhythm, chronic hypoxic respiratory failure secondary to COPD on 3 L nasal cannula chronically, rheumatoid arthritis on chronic prednisone, GERD, peripheral neuropathy, and recent multi-month history of neutropenia presented to [facility] with acute onset of generalized joint pain.  The patient reports developing symptoms on 11/07/2017, these included generalized joint pain that progressed to becoming severe the following day, he was unable to move and therefore EMS was called. He denies any myalgias. He reports being hospitalized 4 times in the past 12 months due to concerns about infection. His methotrexate was stopped he's been maintained on prednisone, current dose of 15 mg daily.  He's been evaluated by hematology oncology in the outpatient setting, no bone marrow biopsy performed. His longtime rheumatologist is Rivadeneira, Cameron Proud, MD within the [facility] health system. He reports being on etanercept trial in the past which paradoxically worsened his symptoms.  While at the [facility] prior to admission, he experienced some poorly localized generalized chest discomfort, described as achy, did not radiate, lasted for 1 minute, not associated with other symptoms such as nausea, vomiting, diaphoresis, shortness of breath. He reports having a daily productive cough related to his COPD that has improved recently with the use of chest physiotherapy.  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 He has had physical therapy and respiratory therapy in the home twice a week to optimize his functional status.  He specifically denies any diarrhea, rash, headache, vision changes, hemoptysis, or recent bleeding.  No dysuria or fevers at home. He reports that ever since coming off methotrexate his average pain level has been a 3 out of 10, over the past 24-48 hours dispense 7 out of 10. At home he takes acetaminophen/oxycodone accommodation product of 7.5-325 mg up to 5 times a day.  Regarding his peripheral neuropathy, he reports formerly it was only in the lower extremities, this improved however now he has symptoms in his bilateral upper extremities.  ED Course: this course prior to admission is remarkable for the following. Vital signs showed fever to 100.5 Fahrenheit, heart rate of 90. Normal respiratory rate. Normal O2 sat on his chronic 3 L of oxygen. Labs remarkable for ANC of 1100, total white count 3.9. Hemoglobin 13.7. CMP unremarkable. Troponin was 0.04 for three consecutive readings. Blood cultures obtained. Flu rapid test was negative. Chest x-ray without acute findings. EKG did not show any acute ST changes. Urinalysis was unremarkable.  He was treated with IV opioids, aspirin 324, and broad-spectrum antimicrobials including vancomycin and ampicillin tazobactam. He was given 1 L of isotonic fluid. Accepted the hospital medicine service for further management.     Assessment & Plan:   Active Problems:   Fever   Neutropenic fever (HCC)  1] neutropenic fever-patient received vancomycin yesterday along with Zosyn.  Currently only on Zosyn.  No evidence of infection found  so far.  Chest x-ray shows no evidence of acute infiltrates.  UA no evidence of UTI.  Blood culture no evidence of any growth so far.  Patient does  report that he is feeling better than yesterday.  He is hemodynamically stable.  His CRP was 23.6, vitamin B12 level was 1014, ESR was 74.  2] history of rheumatoid arthritis patient currently on prednisone.  Methotrexate was stopped by his rheumatologist.  I have placed a call to his rheumatologist at [facility].  3] history of CAD diastolic heart failure stable at this time.  His diuretics were on hold .  His blood pressure has been soft and his chest sounds clear.  Will restart diuretics as needed.  Continue aspirin and Lipitor.  4] COPD O2 dependent at 3 L at home continue patient requesting green flutter valve RT to see him today and give him a flutter valve.  Continue duo nebs and oxygen.  5] peripheral neuropathy continue Lyrica.   DVT prophylaxis: Lovenox Code Status: DO NOT RESUSCITATE but want to be intubated. Family Communication: No family available. Disposition Plan: TBD Consultants: None Procedures: None Antimicrobials: Zosyn Subjective: Feels better denies any nausea vomiting diarrhea constipation.  No cough.   Objective: Vitals:   11/09/17 1846 11/09/17 2231 11/10/17 0407 11/10/17 0810  BP:  110/72 (!) 150/83 104/71  Pulse: 95 82 86 87  Resp: '19 11 19 18  ' Temp:  98.7 F (37.1 C) 98.5 F (36.9 C) 97.6 F (36.4 C)  TempSrc:  Oral Oral Oral  SpO2: 96% 98% 97% 93%  Weight:      Height:        Intake/Output Summary (Last 24 hours) at 11/10/2017 1140 Last data filed at 11/10/2017 0408 Gross per 24 hour  Intake 300 ml  Output 2375 ml  Net -2075 ml   Filed Weights   11/08/17 2217  Weight: 93.9 kg (207 lb)    Examination:  General exam: Appears calm and comfortable  Respiratory system: Clear to auscultation. Respiratory effort normal. Cardiovascular system: S1 & S2 heard, RRR. No JVD, murmurs, rubs, gallops or clicks. No pedal edema. Gastrointestinal system: Abdomen is nondistended, soft and nontender. No organomegaly or masses felt. Normal bowel sounds  heard. Central nervous system: Alert and oriented. No focal neurological deficits. Extremities: Symmetric 5 x 5 power. Skin: No rashes, lesions or ulcers Psychiatry: Judgement and insight appear normal. Mood & affect appropriate.     Data Reviewed: I have personally reviewed following labs and imaging studies  CBC: Recent Labs  Lab 11/08/17 2321 11/09/17 2008 11/10/17 1041  WBC 3.9* 4.1 2.9*  NEUTROABS 1.1* 1.2* PENDING  HGB 13.7 13.1 11.9*  HCT 41.1 39.5 35.5*  MCV 87.8 88.2 87.7  PLT 170 173 421*   Basic Metabolic Panel: Recent Labs  Lab 11/08/17 2321  NA 135  K 3.8  CL 98*  CO2 26  GLUCOSE 107*  BUN 15  CREATININE 0.64  CALCIUM 9.0   GFR: Estimated Creatinine Clearance: 107 mL/min (by C-G formula based on SCr of 0.64 mg/dL). Liver Function Tests: Recent Labs  Lab 11/08/17 2321  AST 30  ALT 26  ALKPHOS 64  BILITOT 1.1  PROT 7.3  ALBUMIN 3.8   No results for input(s): LIPASE, AMYLASE in the last 168 hours. No results for input(s): AMMONIA in the last 168 hours. Coagulation Profile: No results for input(s): INR, PROTIME in the last 168 hours. Cardiac Enzymes: Recent Labs  Lab 11/08/17 2321 11/09/17 0227 11/09/17 0746 11/09/17 2008  TROPONINI 0.04* 0.04* 0.04* 0.06*   BNP (last 3 results) No results for input(s): PROBNP in the last 8760 hours. HbA1C: No results for input(s): HGBA1C in the last 72 hours. CBG: Recent Labs  Lab 11/09/17 0805  GLUCAP 113*   Lipid Profile: No results for input(s): CHOL, HDL, LDLCALC, TRIG, CHOLHDL, LDLDIRECT in the last 72 hours. Thyroid Function Tests: No results for input(s): TSH, T4TOTAL, FREET4, T3FREE, THYROIDAB in the last 72 hours. Anemia Panel: Recent Labs    11/09/17 2024  VITAMINB12 1,014*   Sepsis Labs: Recent Labs  Lab 11/08/17 2334  LATICACIDVEN 1.81    Recent Results (from the past 240 hour(s))  Blood culture (routine x 2)     Status: None (Preliminary result)   Collection Time:  11/09/17 12:34 AM  Result Value Ref Range Status   Specimen Description   Final    BLOOD LEFT FOREARM Performed at [facility], [address] [place], [place] [address]    Special Requests   Final    BOTTLES DRAWN AEROBIC AND ANAEROBIC Blood Culture adequate volume Performed at [facility], [place] [address], [place], [place] [address]    Culture PENDING  Incomplete   Report Status PENDING  Incomplete         Radiology Studies: Dg Chest 2 View  Result Date: 11/09/2017 CLINICAL DATA:  Fever and chest pain EXAM: CHEST - 2 VIEW COMPARISON:  Chest radiograph 10/27/2017 FINDINGS: COPD with multiple large peripheral bullae. No focal consolidation or pulmonary edema. Remote CABG and median sternotomy. No pleural effusion pneumothorax. IMPRESSION: COPD without acute airspace disease. Electronically Signed   By: Ulyses Jarred M.D.   On: 11/09/2017 01:02        Scheduled Meds: . aspirin EC  81 mg Oral Daily  . atorvastatin  80 mg Oral Daily  . enoxaparin (LOVENOX) injection  40 mg Subcutaneous Q24H  . folic acid  7,711 mcg Oral Daily  . pantoprazole  40 mg Oral BID  . polyethylene glycol  17 g Oral Daily  . predniSONE  15 mg Oral Q breakfast  . pregabalin  75 mg Oral BID  . sodium chloride flush  3 mL Intravenous Q12H   Continuous Infusions: . piperacillin-tazobactam (ZOSYN)  IV 3.375 g (11/10/17 1004)     LOS: 1 day   Georgette Shell, MD [facility] If 7PM-7AM, please contact night-coverage [email or web address] Password [email or web address] 11/10/2017, 11:40 AM

## 2017-11-10 NOTE — Plan of Care (Signed)
Progressing

## 2017-11-11 LAB — BASIC METABOLIC PANEL
ANION GAP: 11 (ref 5–15)
BUN: 12 mg/dL (ref 6–20)
CO2: 26 mmol/L (ref 22–32)
Calcium: 8.8 mg/dL — ABNORMAL LOW (ref 8.9–10.3)
Chloride: 102 mmol/L (ref 101–111)
Creatinine, Ser: 0.61 mg/dL (ref 0.61–1.24)
GFR calc Af Amer: >60 mL/min (ref >60)
GLUCOSE: 128 mg/dL — AB (ref 65–99)
POTASSIUM: 3.6 mmol/L (ref 3.5–5.1)
Sodium: 139 mmol/L (ref 135–145)

## 2017-11-11 LAB — PATHOLOGIST SMEAR REVIEW

## 2017-11-11 MED ORDER — POLYETHYLENE GLYCOL 3350 17 G PO PACK: 17.0000 g | PACK | Freq: Every day | ORAL | 0 refills | Status: DC

## 2017-11-11 MED ORDER — PREDNISONE 10 MG PO TABS: 30.0000 mg | ORAL_TABLET | Freq: Every day | ORAL | 0 refills | Status: DC

## 2017-11-11 MED ORDER — PREDNISONE 5 MG PO TABS
5.0000 mg | ORAL_TABLET | Freq: Once | ORAL | Status: AC
  Administered 2017-11-11: 5 mg via ORAL
  Filled 2017-11-11: qty 1

## 2017-11-11 MED ORDER — PREDNISONE 20 MG PO TABS
20.0000 mg | ORAL_TABLET | Freq: Every day | ORAL | Status: DC
  Administered 2017-11-12 (×2): 20 mg via ORAL
  Filled 2017-11-11: qty 1

## 2017-11-11 NOTE — Progress Notes (Signed)
PROGRESS NOTE    Joseph [name]  EXB:[id number] DOB: 07-18-1951 DOA: 11/08/2017 PCP: Colon Branch, MD   Brief Narrative: 67 year old man with medical problems including coronary artery disease status post four-vessel CABG in 1027, chronic diastolic heart failure, history of a flutter status post ablation in summer of 2018 now in sinus rhythm, chronic hypoxic respiratory failure secondary to COPD on 3 L nasal cannula chronically, rheumatoid arthritis on chronic prednisone, GERD, peripheral neuropathy, and recent multi-month history of neutropenia presented to [facility] with acute onset of generalized joint pain.  The patient reports developing symptoms on 11/07/2017, these included generalized joint pain that progressed to becoming severe the following day, he was unable to move and therefore EMS was called. He denies any myalgias. He reports being hospitalized 4 times in the past 12 months due to concerns about infection. His methotrexate was stopped he's been maintained on prednisone, current dose of 15 mg daily. He's been evaluated by hematology oncology in the outpatient setting, no bone marrow biopsy performed. His longtime rheumatologist is Rivadeneira, Cameron Proud, [facility] the [facility]. He reports being on etanercept trial in the past which paradoxically worsened his symptoms.  While at the [facility] prior to admission, he experienced some poorly localized generalized chest discomfort, described as achy, did not radiate, lasted for 1 minute, not associated with other symptoms such as nausea, vomiting, diaphoresis, shortness of breath. He reports having a daily productive cough related to his COPD that has improved recently with the use of chest physiotherapy.  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 He has had physical therapy and respiratory therapy in the home twice a week to optimize his functional status.  He specifically denies any diarrhea, rash, headache, vision changes, hemoptysis, or recent bleeding. No dysuria or fevers at home. He reports that ever since coming off methotrexate his average pain level has been a 3 out of 10, over the past 24-48 hours dispense 7 out of 10. At home he takes acetaminophen/oxycodone accommodation product of 7.5-325 mg up to 5 times a day. Regarding his peripheral neuropathy, he reports formerly it was only in the lower extremities, this improved however now he has symptoms in his bilateral upper extremities.  ED Course:this course prior to admission is remarkable for the following. Vital signs showed fever to 100.5 Fahrenheit, heart rate of 90. Normal respiratory rate. Normal O2 sat on his chronic 3 L of oxygen. Labs remarkable for ANC of 1100, total white count 3.9. Hemoglobin 13.7. CMP unremarkable. Troponin was 0.04 for three consecutive readings. Blood cultures obtained. Flu rapid test was negative. Chest x-ray without acute findings. EKG did not show any acute ST changes. Urinalysis was unremarkable. He was treated with IV opioids, aspirin 324, and broad-spectrum antimicrobials including vancomycin and ampicillin tazobactam. He was given 1 L of isotonic fluid. Accepted the hospital medicine service for further management.     Assessment & Plan:   Active Problems:   Fever   Neutropenic fever (HCC) 1] neutropenic fever-patient received vancomycin yesterday along with Zosyn.  Currently only on Zosyn.  No evidence of infection found so far.  Chest x-ray shows no  evidence of acute infiltrates.  UA no evidence of UTI.  Blood culture no evidence of any growth so far.  Patient does  report that he is feeling better than yesterday.  He is hemodynamically stable.  His CRP was 23.6, vitamin B12 level was 1014, ESR was 74.dc ZOSYN UPON DC IF CULTURES STILL NEGATIVE.PATIENT SHOULD BE READY FOR DC TOMORROW.  2] history of rheumatoid arthritis patient currently on prednisone.  Methotrexate was stopped by his rheumatologist.  I have dw  [facility]ologist  Who advised me to increase his prednisone as needed.  3] history of CAD diastolic heart failure stable at this time.  His diuretics were on hold .  His blood pressure has been soft and his chest sounds clear.  Will restart diuretics as needed.  Continue aspirin and Lipitor.  4] COPD O2 dependent at 3 L at home continue patient requesting green flutter valve RT to see him today and give him a flutter valve.  Continue duo nebs and oxygen.  5] peripheral neuropathy continue Lyrica.      DVT prophylaxislovenox Code Status:partial Family Communication:dw wife in detail in the room. Disposition Plan: dc tomorrow.patient has home health.will need hhpt on dc.  Consultants:  none Procedures:none Antimicrobials:zosyn  Subjective: Feels better.was able to workwith PT  Objective: Vitals:   11/10/17 1940 11/11/17 0507 11/11/17 0821 11/11/17 0939  BP: (!) 142/81 (!) 146/76 (!) 135/94   Pulse:  76 79   Resp: 20 20 (!) 22   Temp: 98.1 F (36.7 C) 98.7 F (37.1 C) 98.1 F (36.7 C)   TempSrc: Oral Oral Oral   SpO2: 97% 97% 92% 97%  Weight:      Height:        Intake/Output Summary (Last 24 hours) at 11/11/2017 1106 Last data filed at 11/11/2017 0300 Gross per 24 hour  Intake 153 ml  Output 325 ml  Net -172 ml   Filed Weights   11/08/17 2217  Weight: 93.9 kg (207 lb)    Examination:decreased edema erythema to multiple joints.  General exam: Appears calm and comfortable  Respiratory system: Clear to auscultation. Respiratory effort normal. Cardiovascular system: S1 & S2 heard, RRR. No JVD, murmurs, rubs,  gallops or clicks. No pedal edema. Gastrointestinal system: Abdomen is nondistended, soft and nontender. No organomegaly or masses felt. Normal bowel sounds heard. Central nervous system: Alert and oriented. No focal neurological deficits. Extremities: Symmetric 5 x 5 power. Skin: No rashes, lesions or ulcers Psychiatry: Judgement and insight appear normal. Mood & affect appropriate.     Data Reviewed: I have personally reviewed following labs and imaging studies  CBC: Recent Labs  Lab 11/08/17 2321 11/09/17 2008 11/10/17 1041 11/10/17 1239  WBC 3.9* 4.1 2.9* 3.2*  NEUTROABS 1.1* 1.2* 1.0* 1.5*  HGB 13.7 13.1 11.9* 13.6  HCT 41.1 39.5 35.5* 40.9  MCV 87.8 88.2 87.7 88.5  PLT 170 173 147* 088   Basic Metabolic Panel: Recent Labs  Lab 11/08/17 2321 11/10/17 1041 11/11/17 0236  NA 135 134* 139  K 3.8 3.2* 3.6  CL 98* 98* 102  CO2 '26 25 26  ' GLUCOSE 107* 177* 128*  BUN '15 12 12  ' CREATININE 0.64 0.72 0.61  CALCIUM 9.0 8.4* 8.8*   GFR: Estimated Creatinine Clearance: 107 mL/min (by C-G formula based on SCr of 0.61 mg/dL). Liver Function Tests: Recent Labs  Lab 11/08/17 2321 11/10/17 1041  AST 30 28  ALT 26 25  ALKPHOS 64 51  BILITOT 1.1 1.0  PROT 7.3  6.1*  ALBUMIN 3.8 2.8*   No results for input(s): LIPASE, AMYLASE in the last 168 hours. No results for input(s): AMMONIA in the last 168 hours. Coagulation Profile: No results for input(s): INR, PROTIME in the last 168 hours. Cardiac Enzymes: Recent Labs  Lab 11/08/17 2321 11/09/17 0227 11/09/17 0746 11/09/17 2008 11/10/17 1041  TROPONINI 0.04* 0.04* 0.04* 0.06* 0.06*   BNP (last 3 results) No results for input(s): PROBNP in the last 8760 hours. HbA1C: No results for input(s): HGBA1C in the last 72 hours. CBG: Recent Labs  Lab 11/09/17 0805  GLUCAP 113*   Lipid Profile: No results for input(s): CHOL, HDL, LDLCALC, TRIG, CHOLHDL, LDLDIRECT in the last 72 hours. Thyroid Function Tests: No results  for input(s): TSH, T4TOTAL, FREET4, T3FREE, THYROIDAB in the last 72 hours. Anemia Panel: Recent Labs    11/09/17 2024  VITAMINB12 1,014*   Sepsis Labs: Recent Labs  Lab 11/08/17 2334  LATICACIDVEN 1.81    Recent Results (from the past 240 hour(s))  Blood culture (routine x 2)     Status: None (Preliminary result)   Collection Time: 11/08/17 11:29 PM  Result Value Ref Range Status   Specimen Description   Final    BLOOD RIGHT ANTECUBITAL Performed at [facility], [address] [place], [place] [address]    Special Requests   Final    BOTTLES DRAWN AEROBIC AND ANAEROBIC Blood Culture adequate volume Performed at [facility], [address] [place], [place] [address]    Culture   Final    NO GROWTH 2 DAYS Performed at [facility], [place] [address], [place], [place] [address]    Report Status PENDING  Incomplete  Blood culture (routine x 2)     Status: None (Preliminary result)   Collection Time: 11/09/17 12:34 AM  Result Value Ref Range Status   Specimen Description   Final    BLOOD LEFT FOREARM Performed at [facility], [address] [place], [place] [address]    Special Requests   Final    BOTTLES DRAWN AEROBIC AND ANAEROBIC Blood Culture adequate volume   Culture   Final    NO GROWTH 2 DAYS Performed at [facility], [place] [address] [place], [place] [address]    Report Status PENDING  Incomplete  Respiratory Panel by PCR     Status: None   Collection Time: 11/09/17 11:38 PM  Result Value Ref Range Status   Adenovirus NOT DETECTED NOT DETECTED Final   Coronavirus 229E NOT DETECTED NOT DETECTED Final   Coronavirus HKU1 NOT DETECTED NOT DETECTED Final   Coronavirus NL63 NOT DETECTED NOT DETECTED Final   Coronavirus OC43 NOT DETECTED NOT DETECTED Final   Metapneumovirus NOT DETECTED NOT DETECTED Final   Rhinovirus / Enterovirus NOT DETECTED NOT DETECTED Final   Influenza A NOT DETECTED NOT DETECTED Final    Influenza B NOT DETECTED NOT DETECTED Final   Parainfluenza Virus 1 NOT DETECTED NOT DETECTED Final   Parainfluenza Virus 2 NOT DETECTED NOT DETECTED Final   Parainfluenza Virus 3 NOT DETECTED NOT DETECTED Final   Parainfluenza Virus 4 NOT DETECTED NOT DETECTED Final   Respiratory Syncytial Virus NOT DETECTED NOT DETECTED Final   Bordetella pertussis NOT DETECTED NOT DETECTED Final   Chlamydophila pneumoniae NOT DETECTED NOT DETECTED Final   Mycoplasma pneumoniae NOT DETECTED NOT DETECTED Final    Comment: Performed at [facility], [place] [address],  [place], [place] [address]         Radiology Studies: No results found.      Scheduled Meds: . aspirin EC  81 mg Oral Daily  . atorvastatin  80 mg Oral Daily  . enoxaparin (LOVENOX) injection  40 mg Subcutaneous Q24H  . fluticasone furoate-vilanterol  1 puff Inhalation Daily   And  . umeclidinium bromide  1 puff Inhalation Daily  . folic acid  9,407 mcg Oral Daily  . pantoprazole  40 mg Oral BID  . polyethylene glycol  17 g Oral Daily  . predniSONE  20 mg Oral Q breakfast  . pregabalin  75 mg Oral BID  . sodium chloride flush  3 mL Intravenous Q12H   Continuous Infusions: . piperacillin-tazobactam (ZOSYN)  IV 3.375 g (11/11/17 1057)     LOS: 2 days     Georgette Shell, MD [facility] If 7PM-7AM, please contact night-coverage [email or web address] Password [email or web address] 11/11/2017, 11:06 AM

## 2017-11-11 NOTE — Progress Notes (Signed)
Physical Therapy Treatment Patient Details Name: Joseph [name] MRN: [id number] DOB: 19-Aug-1951 Today's Date: 11/11/2017    History of Present Illness 67 year old man with medical problems including coronary artery disease status post four-vessel CABG in 5366, chronic diastolic heart failure, history of a flutter status post ablation in summer of 2018 now in sinus rhythm, chronic hypoxic respiratory failure secondary to COPD on 3 L nasal cannula chronically, rheumatoid arthritis on chronic prednisone, GERD, peripheral neuropathy, and recent multi-month history of neutropenia presented to [facility] with acute onset of generalized joint pain    PT Comments    Patient received sitting in chair near bed, very pleasant and willing to work with PT this afternoon, reports he feels much better pain-wise. He is able to perform functional transfers with and without bilateral platform walker with min guard today from elevated surface, and was able to ambulate approximately 129f with bilateral platform walker and min guard. SpO2 remained in the 90s on 3LPM O2 during gait period. He was left sitting at the side of the bed per his request with all needs met this afternoon.    Follow Up Recommendations  Home health PT;Supervision/Assistance - 24 hour     Equipment Recommendations  None recommended by PT    Recommendations for Other Services       Precautions / Restrictions Precautions Precautions: Fall Precaution Comments: watch O2 saturations Required Braces or Orthoses: Other Brace/Splint Other Brace/Splint: wears orthotic shoes bilaterally Restrictions Weight Bearing Restrictions: No    Mobility  Bed Mobility               General bed mobility comments: DNT, received sitting up in chair   Transfers Overall transfer level: Needs assistance Equipment used: Bilateral platform walker Transfers: Sit to/from Stand;Stand Pivot Transfers Sit to Stand: Min guard;From  elevated surface Stand pivot transfers: Min guard       General transfer comment: Min guard with extended time and effort, mobility improving   Ambulation/Gait Ambulation/Gait assistance: Min guard Ambulation Distance (Feet): 150 Feet Assistive device: Bilateral platform walker Gait Pattern/deviations: Step-through pattern;Decreased step length - right;Decreased step length - left;Trunk flexed     General Gait Details: able to gait train approximately 1546fwith platform walker today, noted B ER of hips but otherwise mechanics appeared generally at baseline; SpO2 remained in 90s on 3LPM O2    Stairs            Wheelchair Mobility    Modified Rankin (Stroke Patients Only)       Balance Overall balance assessment: Needs assistance Sitting-balance support: Feet supported;Bilateral upper extremity supported Sitting balance-Leahy Scale: Good     Standing balance support: Bilateral upper extremity supported;During functional activity Standing balance-Leahy Scale: Fair Standing balance comment: reliance on UE support, able to stand without UE support statically with Min guard                             Cognition Arousal/Alertness: Awake/alert Behavior During Therapy: WFL for tasks assessed/performed Overall Cognitive Status: Within Functional Limits for tasks assessed                                        Exercises      General Comments        Pertinent Vitals/Pain Pain Assessment: No/denies pain Pain Score: 0-No pain Pain Intervention(s): Limited  activity within patient's tolerance;Monitored during session    Home Living                      Prior Function            PT Goals (current goals can now be found in the care plan section) Acute Rehab PT Goals Patient Stated Goal: to get stronger PT Goal Formulation: With patient/family Time For Goal Achievement: 11/24/17 Potential to Achieve Goals: Good Progress  towards PT goals: Progressing toward goals    Frequency    Min 3X/week      PT Plan      Co-evaluation              AM-PAC PT "6 Clicks" Daily Activity  Outcome Measure  Difficulty turning over in bed (including adjusting bedclothes, sheets and blankets)?: Unable Difficulty moving from lying on back to sitting on the side of the bed? : Unable Difficulty sitting down on and standing up from a chair with arms (e.g., wheelchair, bedside commode, etc,.)?: Unable Help needed moving to and from a bed to chair (including a wheelchair)?: A Little Help needed walking in hospital room?: A Little Help needed climbing 3-5 steps with a railing? : A Lot 6 Click Score: 11    End of Session Equipment Utilized During Treatment: Gait belt;Oxygen Activity Tolerance: Patient tolerated treatment well Patient left: in bed;with call bell/phone within reach;with family/visitor present(sitting at EOB per his request )   PT Visit Diagnosis: Muscle weakness (generalized) (M62.81);Difficulty in walking, not elsewhere classified (R26.2)     Time: 7711-6579 PT Time Calculation (min) (ACUTE ONLY): 28 min  Charges:  $Gait Training: 23-37 mins                    G Codes:       Deniece Ree PT, DPT, CBIS  Supplemental Physical Therapist [place]   Pager [phone]

## 2017-11-11 NOTE — Progress Notes (Signed)
PT Cancellation Note  Patient Details Name: Joseph [name] MRN: [id number] DOB: December 02, 1950   Cancelled Treatment:    Reason Eval/Treat Not Completed: Patient at procedure or test/unavailable Attempted to work with patient, he was speaking with MD and asked that PT return later in day. Plan to attempt to return as/if able and schedule allows.    Nedra Hai PT, DPT, CBIS  Supplemental Physical Therapist [facility]   Pager [phone]

## 2017-11-12 DIAGNOSIS — R5081 Fever presenting with conditions classified elsewhere: Secondary | ICD-10-CM

## 2017-11-12 DIAGNOSIS — D709 Neutropenia, unspecified: Secondary | ICD-10-CM

## 2017-11-12 DIAGNOSIS — M069 Rheumatoid arthritis, unspecified: Secondary | ICD-10-CM

## 2017-11-12 DIAGNOSIS — R509 Fever, unspecified: Secondary | ICD-10-CM

## 2017-11-12 NOTE — Discharge Summary (Signed)
Physician Discharge Summary  Joseph [name] JKK:[id number] DOB: 07-19-1951 DOA: 11/08/2017  PCP: Colon Branch, MD  Admit date: 11/08/2017 Discharge date: 11/12/2017  Admitted From: Home Disposition: Home  Recommendations for Outpatient Follow-up:  1. Follow up with PCP in 1 week 2. Please obtain BMP/CBC in one week 3. Please follow up on the following pending results: None  Home Health: PT, OT Equipment/Devices: None  Discharge Condition: Stable CODE STATUS: Full code Diet recommendation: Heart healthy   Brief/Interim Summary:  Admission HPI written by Norval Morton, MD   Chief Complaint: joint pain  HPI: this 68 year old man with medical problems including coronary artery disease status post four-vessel CABG in 7169, chronic diastolic heart failure, history of a flutter status post ablation in summer of 2018 now in sinus rhythm, chronic hypoxic respiratory failure secondary to COPD on 3 L nasal cannula chronically, rheumatoid arthritis on chronic prednisone, GERD, peripheral neuropathy, and recent multi-month history of neutropenia presented to [facility] with acute onset of generalized joint pain.  The patient reports developing symptoms on 11/07/2017, these included generalized joint pain that progressed to becoming severe the following day, he was unable to move and therefore EMS was called. He denies any myalgias. He reports being hospitalized 4 times in the past 12 months due to concerns about infection. His methotrexate was stopped he's been maintained on prednisone, current dose of 15 mg daily.  He's been evaluated by hematology oncology in the outpatient setting, no bone marrow biopsy performed. His longtime rheumatologist is Rivadeneira, Cameron Proud, MD within the [facility]. He reports being on etanercept trial in the past which paradoxically worsened his symptoms.  While at the [facility] prior to admission, he experienced some poorly  localized generalized chest discomfort, described as achy, did not radiate, lasted for 1 minute, not associated with other symptoms such as nausea, vomiting, diaphoresis, shortness of breath. He reports having a daily productive cough related to his COPD that has improved recently with the use of chest physiotherapy.  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 He has had physical therapy and respiratory therapy in the home twice a week to optimize his functional status.  He specifically denies any diarrhea, rash, headache, vision changes, hemoptysis, or recent bleeding.  No dysuria or fevers at home. He reports that ever since coming off methotrexate his average pain level has been a 3 out of 10, over the past 24-48 hours dispense 7 out of 10. At home he takes acetaminophen/oxycodone accommodation product of 7.5-325 mg up to 5 times a day.  Regarding his peripheral neuropathy, he reports formerly it was only in the lower extremities, this improved however now he has symptoms in his bilateral upper extremities.  ED Course: this course prior to admission is remarkable for the following. Vital signs showed fever to 100.5 Fahrenheit, heart rate of 90. Normal respiratory rate. Normal O2 sat on his chronic 3 L of oxygen. Labs remarkable for ANC of 1100, total white count 3.9. Hemoglobin 13.7. CMP unremarkable. Troponin was 0.04 for three consecutive readings. Blood cultures obtained. Flu rapid test was negative. Chest x-ray without acute findings. EKG did not show  any acute ST changes. Urinalysis was unremarkable.  He was treated with IV opioids, aspirin 324, and broad-spectrum  antimicrobials including vancomycin and ampicillin tazobactam. He was given 1 L of isotonic fluid. Accepted the hospital medicine service for further management.    Hospital course:  Neutropenic fever Non-infectious. Secondary to rheumatologic flare of rheumatoid arthritis. Cultures negative to date.  Rheumatoid arthritis flare Elevated CRP and ESR. Improved with steroids. Patient previously on a taper, but restarted on prednisone 20 mg daily until he sees his rheumatologist.  CAD Stable. Continued aspirin and lipitor.  COPD Chronic respiratory failure On 3L O2 which was stable.  Peripheral neuropathy Continued Lyrica.   Discharge Diagnoses:  Active Problems:   Fever   Neutropenic fever [facility])    Discharge Instructions  Discharge Instructions    Call MD for:  persistant nausea and vomiting   Complete by:  As directed    Call MD for:  severe uncontrolled pain   Complete by:  As directed    Call MD for:  temperature >100.4   Complete by:  As directed    Diet - low sodium heart healthy   Complete by:  As directed    Increase activity slowly   Complete by:  As directed      Allergies as of 11/12/2017   No Known Allergies     Medication List    TAKE these medications   acetaminophen 500 MG tablet Commonly known as:  TYLENOL Take 500 mg by mouth every 6 (six) hours as needed for mild pain or headache. Max of 2000 mg/day   albuterol 108 (90 Base) MCG/ACT inhaler Commonly known as:  PROAIR HFA Inhale 2 puffs into the lungs every 4 (four) hours as needed for wheezing or shortness of breath.   aspirin EC 81 MG tablet Take 81 mg by mouth daily.   atorvastatin 80 MG tablet Commonly known as:  LIPITOR TAKE 1 TABLET BY MOUTH DAILY What changed:    how much to take  how to take this  when to take this   dextromethorphan-guaiFENesin 30-600 MG 12hr tablet Commonly known as:  MUCINEX DM Take 1 tablet by mouth 2 (two) times daily. What changed:  when to take  this   folic acid 675 MCG tablet Commonly known as:  FOLVITE Take 800 mcg by mouth daily.   furosemide 20 MG tablet Commonly known as:  LASIX Take 3 tablets (60 mg total) by mouth daily. What changed:    how much to take  when to take this  additional instructions   ipratropium-albuterol 0.5-2.5 (3) MG/3ML Soln Commonly known as:  DUONEB Take 3 mLs by nebulization every 4 (four) hours as needed. What changed:  reasons to take this   KLOR-CON 10 10 MEQ tablet Generic drug:  potassium chloride TAKE 4 TABLETS BY MOUTH EVERY MORNING AND TAKE 2 TABLETS EVERY EVENING What changed:  See the new instructions.   LOTEMAX 0.5 % Gel Generic drug:  Loteprednol Etabonate Place 1 drop into both eyes every morning. daily   Melatonin 1 MG Caps Take 4 mg by mouth as needed (Sleep).   nitroGLYCERIN 0.4 MG SL tablet Commonly known as:  NITROSTAT Place 1 tablet (0.4 mg total) under the tongue every 5 (five) minutes as needed for chest pain (up to 3 doses for severe chest pain. If no relief after 3rd dose, proceed to the ED for an evaluation).   oxyCODONE-acetaminophen 7.5-325 MG tablet Commonly known as:  PERCOCET Take 1 tablet by  mouth every 6 (six) hours as needed for moderate pain.   OXYGEN Inhale 3 L into the lungs continuous.   pantoprazole 40 MG tablet Commonly known as:  PROTONIX Take 1 tablet (40 mg total) by mouth 2 (two) times daily.   PERFECT IRON PO Take 1 tablet by mouth every morning.   polyethylene glycol packet Commonly known as:  MIRALAX / GLYCOLAX Take 17 g by mouth daily.   predniSONE 20 MG tablet Commonly known as:  DELTASONE Take 20 mg by mouth daily with breakfast. What changed:  Another medication with the same name was removed. Continue taking this medication, and follow the directions you see here.   pregabalin 75 MG capsule Commonly known as:  LYRICA Take 75 mg by mouth 2 (two) times daily.   sodium chloride 0.65 % Soln nasal spray Commonly known  as:  OCEAN Place 1 spray into both nostrils as needed for congestion.   SUPER B COMPLEX PO Take 1 tablet by mouth daily.   TRELEGY ELLIPTA 100-62.5-25 MCG/INH Aepb Generic drug:  Fluticasone-Umeclidin-Vilant Inhale 1 puff into the lungs daily.   vitamin C 500 MG tablet Commonly known as:  ASCORBIC ACID Take 500 mg by mouth daily.   VITEYES AREDS ADVANCED PO Take 1 tablet by mouth daily.      Follow-up Information    Colon Branch, MD. Schedule an appointment as soon as possible for a visit in 1 week(s).   Specialty:  Internal Medicine Contact information: [address] [place] [address] [phone]        Ted Mcalpine, MD. Go on 11/28/2017.   Specialty:  Internal Medicine         No Known Allergies  Consultations:  None   Procedures/Studies: Dg Chest 2 View  Result Date: 11/09/2017 CLINICAL DATA:  Fever and chest pain EXAM: CHEST - 2 VIEW COMPARISON:  Chest radiograph 10/27/2017 FINDINGS: COPD with multiple large peripheral bullae. No focal consolidation or pulmonary edema. Remote CABG and median sternotomy. No pleural effusion pneumothorax. IMPRESSION: COPD without acute airspace disease. Electronically Signed   By: Ulyses Jarred M.D.   On: 11/09/2017 01:02   Dg Chest 2 View  Result Date: 10/27/2017 CLINICAL DATA:  Fever EXAM: CHEST  2 VIEW COMPARISON:  09/03/2017, 09/02/2017, 08/17/2017, 08/20/2016. CT chest 08/14/2017 FINDINGS: Post sternotomy changes. Diffuse emphysematous disease with thick-walled cyst in the left upper lung as before. Coarse interstitial opacities consistent with chronic change. No acute consolidation or effusion. Stable cardiomediastinal silhouette with aortic atherosclerosis. No pneumothorax. IMPRESSION: No active cardiopulmonary disease. Similar appearance of emphysematous disease and chronic coarse interstitial changes. Electronically Signed   By: Donavan Foil M.D.   On: 10/27/2017 01:51        Subjective: Feels a lot better  Discharge Exam: Vitals:   11/12/17 0556 11/12/17 0826  BP: 127/76   Pulse: 70   Resp: 16   Temp: 97.7 F (36.5 C)   SpO2: 98% 98%   Vitals:   11/11/17 1602 11/11/17 1952 11/12/17 0556 11/12/17 0826  BP: 105/76 (!) 144/82 127/76   Pulse: 77 82 70   Resp: '16 16 16   ' Temp: 97.7 F (36.5 C) 97.8 F (36.6 C) 97.7 F (36.5 C)   TempSrc: Oral Oral Oral   SpO2: 96% 100% 98% 98%  Weight:      Height:        General: Pt is alert, awake, not in acute distress Cardiovascular: RRR, S1/S2 +, no rubs, no  gallops Respiratory: CTA bilaterally, no wheezing, no rhonchi Abdominal: Soft, NT, ND, bowel sounds + Extremities: no edema, no cyanosis. Deformities of bilateral fingers.    The results of significant diagnostics from this hospitalization (including imaging, microbiology, ancillary and laboratory) are listed below for reference.     Microbiology: Recent Results (from the past 240 hour(s))  Blood culture (routine x 2)     Status: None (Preliminary result)   Collection Time: 11/08/17 11:29 PM  Result Value Ref Range Status   Specimen Description   Final    BLOOD RIGHT ANTECUBITAL Performed at [facility], [address] [place], [place] [address]    Special Requests   Final    BOTTLES DRAWN AEROBIC AND ANAEROBIC Blood Culture adequate volume Performed at [facility], [address] [place], [place] [address]    Culture   Final    NO GROWTH 2 DAYS Performed at [facility], [place] [address], [place], [place] [address]    Report Status PENDING  Incomplete  Blood culture (routine x 2)     Status: None (Preliminary result)   Collection Time: 11/09/17 12:34 AM  Result Value Ref Range Status   Specimen Description   Final    BLOOD LEFT FOREARM Performed at [facility], [address] [place], [place] [address]    Special Requests   Final    BOTTLES DRAWN AEROBIC AND ANAEROBIC Blood  Culture adequate volume   Culture   Final    NO GROWTH 2 DAYS Performed at [facility], [place] [address] [place], [place] [address]    Report Status PENDING  Incomplete  Respiratory Panel by PCR     Status: None   Collection Time: 11/09/17 11:38 PM  Result Value Ref Range Status   Adenovirus NOT DETECTED NOT DETECTED Final   Coronavirus 229E NOT DETECTED NOT DETECTED Final   Coronavirus HKU1 NOT DETECTED NOT DETECTED Final   Coronavirus NL63 NOT DETECTED NOT DETECTED Final   Coronavirus OC43 NOT DETECTED NOT DETECTED Final   Metapneumovirus NOT DETECTED NOT DETECTED Final   Rhinovirus / Enterovirus NOT DETECTED NOT DETECTED Final   Influenza A NOT DETECTED NOT DETECTED Final   Influenza B NOT DETECTED NOT DETECTED Final   Parainfluenza Virus 1 NOT DETECTED NOT DETECTED Final   Parainfluenza Virus 2 NOT DETECTED NOT DETECTED Final   Parainfluenza Virus 3 NOT DETECTED NOT DETECTED Final   Parainfluenza Virus 4 NOT DETECTED NOT DETECTED Final   Respiratory Syncytial Virus NOT DETECTED NOT DETECTED Final   Bordetella pertussis NOT DETECTED NOT DETECTED Final   Chlamydophila pneumoniae NOT DETECTED NOT DETECTED Final   Mycoplasma pneumoniae NOT DETECTED NOT DETECTED Final    Comment: Performed at [facility], [place] [address], [place],  [address]     Labs: BNP (last 3 results) No results for input(s): BNP in the last 8760 hours. Basic Metabolic Panel: Recent Labs  Lab 11/08/17 2321 11/10/17 1041 11/11/17 0236  NA 135 134* 139  K 3.8 3.2* 3.6  CL 98* 98* 102  CO2 '26 25 26  ' GLUCOSE 107* 177* 128*  BUN '15 12 12  ' CREATININE 0.64 0.72 0.61  CALCIUM 9.0 8.4* 8.8*   Liver Function Tests: Recent Labs  Lab 11/08/17 2321 11/10/17 1041  AST 30 28  ALT 26 25  ALKPHOS 64 51  BILITOT 1.1 1.0  PROT 7.3 6.1*  ALBUMIN 3.8 2.8*   No  results for input(s): LIPASE, AMYLASE in the last 168 hours. No results for input(s): AMMONIA in the last 168 hours. CBC: Recent  Labs  Lab 11/08/17 2321 11/09/17 2008 11/10/17 1041 11/10/17 1239  WBC 3.9* 4.1 2.9* 3.2*  NEUTROABS 1.1* 1.2* 1.0* 1.5*  HGB 13.7 13.1 11.9* 13.6  HCT 41.1 39.5 35.5* 40.9  MCV 87.8 88.2 87.7 88.5  PLT 170 173 147* 161   Cardiac Enzymes: Recent Labs  Lab 11/08/17 2321 11/09/17 0227 11/09/17 0746 11/09/17 2008 11/10/17 1041  TROPONINI 0.04* 0.04* 0.04* 0.06* 0.06*   BNP: Invalid input(s): POCBNP CBG: Recent Labs  Lab 11/09/17 0805  GLUCAP 113*   D-Dimer No results for input(s): DDIMER in the last 72 hours. Hgb A1c No results for input(s): HGBA1C in the last 72 hours. Lipid Profile No results for input(s): CHOL, HDL, LDLCALC, TRIG, CHOLHDL, LDLDIRECT in the last 72 hours. Thyroid function studies No results for input(s): TSH, T4TOTAL, T3FREE, THYROIDAB in the last 72 hours.  Invalid input(s): FREET3 Anemia work up Recent Labs    11/09/17 2024  VITAMINB12 1,014*   Urinalysis    Component Value Date/Time   COLORURINE YELLOW 11/09/2017 0132   APPEARANCEUR CLEAR 11/09/2017 0132   LABSPEC 1.010 11/09/2017 0132   PHURINE 6.5 11/09/2017 0132   GLUCOSEU NEGATIVE 11/09/2017 0132   HGBUR NEGATIVE 11/09/2017 0132   HGBUR negative 04/12/2008 1103   BILIRUBINUR NEGATIVE 11/09/2017 0132   KETONESUR NEGATIVE 11/09/2017 0132   PROTEINUR NEGATIVE 11/09/2017 0132   UROBILINOGEN 0.2 10/17/2012 1236   NITRITE NEGATIVE 11/09/2017 0132   LEUKOCYTESUR NEGATIVE 11/09/2017 0132   Sepsis Labs Invalid input(s): PROCALCITONIN,  WBC,  LACTICIDVEN Microbiology Recent Results (from the past 240 hour(s))  Blood culture (routine x 2)     Status: None (Preliminary result)   Collection Time: 11/08/17 11:29 PM  Result Value Ref Range Status   Specimen Description   Final    BLOOD RIGHT ANTECUBITAL Performed at [facility], [address] [place], [place] [address]    Special Requests   Final    BOTTLES DRAWN AEROBIC AND ANAEROBIC Blood Culture adequate  volume Performed at [facility], [address] [place], [place] [address]    Culture   Final    NO GROWTH 2 DAYS Performed at [facility], [place] [address], [place], [place] [address]    Report Status PENDING  Incomplete  Blood culture (routine x 2)     Status: None (Preliminary result)   Collection Time: 11/09/17 12:34 AM  Result Value Ref Range Status   Specimen Description   Final    BLOOD LEFT FOREARM Performed at [facility], [address] [place], [place] [address]    Special Requests   Final    BOTTLES DRAWN AEROBIC AND ANAEROBIC Blood Culture adequate volume   Culture   Final    NO GROWTH 2 DAYS Performed at [facility], [place] [address] [place], [place] [address]    Report Status PENDING  Incomplete  Respiratory Panel by PCR     Status: None   Collection Time: 11/09/17 11:38 PM  Result Value Ref Range Status   Adenovirus NOT DETECTED NOT DETECTED Final   Coronavirus 229E NOT DETECTED NOT DETECTED Final   Coronavirus HKU1 NOT DETECTED NOT DETECTED Final   Coronavirus NL63 NOT DETECTED NOT DETECTED Final   Coronavirus OC43 NOT DETECTED NOT DETECTED Final   Metapneumovirus NOT DETECTED NOT DETECTED Final  Rhinovirus / Enterovirus NOT DETECTED NOT DETECTED Final   Influenza A NOT DETECTED NOT DETECTED Final   Influenza B NOT DETECTED NOT DETECTED Final   Parainfluenza Virus 1 NOT DETECTED NOT DETECTED Final   Parainfluenza Virus 2 NOT DETECTED NOT DETECTED Final   Parainfluenza Virus 3 NOT DETECTED NOT DETECTED Final   Parainfluenza Virus 4 NOT DETECTED NOT DETECTED Final   Respiratory Syncytial Virus NOT DETECTED NOT DETECTED Final   Bordetella pertussis NOT DETECTED NOT DETECTED Final   Chlamydophila pneumoniae NOT DETECTED NOT DETECTED Final   Mycoplasma pneumoniae NOT DETECTED NOT DETECTED Final    Comment: Performed at [facility], [place] [address] [place], [place] [address]     Time coordinating discharge: Over 30  minutes  SIGNED:   Cordelia Poche, MD [facility] 11/12/2017, 12:05 PM Pager [phone]  If 7PM-7AM, please contact night-coverage [email or web address] Password [email or web address]

## 2017-11-12 NOTE — Plan of Care (Signed)
Patient and spouse present in room. Patient receptive to teaching and education. Patient states that he is awaiting discharge to home with a recent move anticipated. Patient asked appropriate questions. Rayetta Pigg, RN 11/12/2017 1:10 PM

## 2017-11-12 NOTE — Care Management Note (Signed)
Case Management Note Donn Pierini RN, BSN Unit 4E-Case Manager [phone]  Patient Details  Name: Joseph [name] MRN: [id number] Date of Birth: 1951-04-29  Subjective/Objective:  Pt admitted with Fever/RA flare                   Action/Plan: PTA pt lived at home with wife, has home 02 at baseline, and active with [facility] for [facility] services- orders have been placed for HHPT/OT to resume with [facility]- spoke with pt and wife at bedside- per conversation they want to continue with [facility] for [facility] services as long as possible- their plan is to move near to [facility] in the next month or so and then would like to find a [facility] agency there to service patient- have provided them with a list of agencies for both [place] and [place] to review to assist them with finding an agency for [facility] needs at [facility]. Call made to Adacia with [facility] for resumption of HH services at discharge. No further transition of care needs noted. Pt to return home with wife.   Expected Discharge Date:  11/12/17               Expected Discharge Plan:  Home w Home Health Services  In-House Referral:  NA  Discharge planning Services  CM Consult, Other - See comment  Post Acute Care Choice:  Home Health, Resumption of Svcs/PTA Provider Choice offered to:  Patient, Spouse  DME Arranged:  N/A DME Agency:  NA  HH Arranged:  PT, OT HH Agency:  [facility]  Status of Service:  Completed, signed off  If discussed at Long Length of Stay Meetings, dates discussed:    Discharge Disposition: home/home health   Additional Comments:  Darrold Span, RN 11/12/2017, 2:08 PM

## 2017-11-12 NOTE — Discharge Instructions (Signed)
Rheumatoid Arthritis Rheumatoid arthritis (RA) is a long-term (chronic) disease that causes inflammation in your joints. RA may start slowly. It usually affects the small joints of the hands and feet. Usually, the same joints are affected on both sides of your body. Inflammation from RA can also affect other parts of your body, including your heart, eyes, or lungs. RA is an autoimmune disease. That means that your body's defense system (immune system) mistakenly attacks healthy body tissues. There is no cure for RA, but medicines can help your symptoms and halt or slow down the progression of the disease. What are the causes? The exact cause of RA is not known. What increases the risk? This condition is more likely to develop in:  Women.  People who have a family history of RA or other autoimmune diseases.  What are the signs or symptoms? Symptoms of this condition vary from person to person. Symptoms usually start gradually. They are often worse in the morning. The first symptom may be morning stiffness that lasts longer than 30 minutes. As RA progresses, symptoms may include:  Pain, stiffness, swelling, warmth, and tenderness in joints on both sides of your body.  Loss of energy.  Loss of appetite.  Weight loss.  Low-grade fever.  Dry eyes and dry mouth.  Firm lumps (rheumatoid nodules) that grow beneath your skin in areas such as your forearm bones near your elbows and on your hands.  Changes in the appearance of joints (deformity) and loss of joint function.  Symptoms of RA often come and go. Sometimes, symptoms get worse for a period of time. These are called flares. How is this diagnosed? This condition is diagnosed based on your symptoms, medical history, and physical exam. You may have X-rays or MRI to check for the type of joint changes that are caused by RA. You may also have blood tests to look for:  Proteins (antibodies) that your immune system may make if you have RA.  They include rheumatoid factor (RF) and anti-CCP. ? When blood tests show these proteins, you are said to have "seropositive RA." ? When blood tests do not show these proteins, you may have "seronegative RA."  Inflammation in your blood.  A low number of red blood cells (anemia).  How is this treated? The goals of treatment are to relieve pain, reduce inflammation, and slow down or stop joint damage and disability. Treatment may include:  Lifestyle changes. It is important to rest, eat a healthy diet, and exercise.  Medicines. Your health care provider may adjust your medicines every 3 months until treatment goals are reached. Common medicines include: ? Pain relievers (analgesics). ? Corticosteroids and NSAIDs to reduce inflammation. ? Disease-modifying antirheumatic drugs (DMARDs) to try to slow the course of the disease. ? Biologic response modifiers to reduce inflammation and damage.  Physical therapy and occupational therapy.  Surgery, if you have severe joint damage. Joint replacement or fusing of joints may be needed.  Your health care provider will work with you to identify the best treatment option for you based on assessment of the overall disease activity in your body. Follow these instructions at home:  Take over-the-counter and prescription medicines only as told by your health care provider.  Start an exercise program as told by your health care provider.  Rest when you are having a flare.  Return to your normal activities as told by your health care provider. Ask your health care provider what activities are safe for you.  Keep all   follow-up visits as told by your health care provider. This is important. Where to find more information:  American College of Rheumatology: www.rheumatology.org  Arthritis Foundation: www.arthritis.org Contact a health care provider if:  You have a flare-up of RA symptoms.  You have a fever.  You have side effects from your  medicines. Get help right away if:  You have chest pain.  You have trouble breathing.  You quickly develop a hot, painful joint that is more severe than your usual joint aches. This information is not intended to replace advice given to you by your health care provider. Make sure you discuss any questions you have with your health care provider. Document Released: 08/09/2000 Document Revised: 01/16/2016 Document Reviewed: 05/25/2015 Elsevier Interactive Patient Education  2018 Elsevier Inc.   

## 2017-11-12 NOTE — Progress Notes (Addendum)
Patient in stable condition. IV site discontinued from left forearm with minimal blleding noted and catheter tip intact. Patient education of medications, appointments, safety measures at home, and instructions upon discharge reviewed with patient and spouse and verbalized understanding.Prescriptions given to patient. Telemetry discontinued and CCMD notified of discharge. Personal belongings at bedside. Wife to transport patient home. Instructions repeated and affirmed by patient. Rayetta Pigg, RN 11/12/2017 2:07 PM

## 2017-11-12 NOTE — Progress Notes (Signed)
Physical Therapy Treatment Patient Details Name: Joseph [name] MRN: [id number] DOB: 1950-12-15 Today's Date: 11/12/2017    History of Present Illness 67 year old man with medical problems including coronary artery disease status post four-vessel CABG in 2007, chronic diastolic heart failure, history of a flutter status post ablation in summer of 2018 now in sinus rhythm, chronic hypoxic respiratory failure secondary to COPD on 3 L nasal cannula chronically, rheumatoid arthritis on chronic prednisone, GERD, peripheral neuropathy, and recent multi-month history of neutropenia presented to [facility] with acute onset of generalized joint pain    PT Comments    Pt performed increased gait and functional mobility.  Pt progressing well and requiring Supervision with gait training this am.  Pt motivated and concerned about his progress.  At this time patient is safe and remains appropriate to return home with support from spouse. Good improvement during session this am.   Follow Up Recommendations  Home health PT;Supervision/Assistance - 24 hour     Equipment Recommendations  None recommended by PT    Recommendations for Other Services       Precautions / Restrictions Precautions Precautions: Fall Precaution Comments: watch O2 saturations, severe RA in B hands.   Required Braces or Orthoses: Other Brace/Splint Other Brace/Splint: wears orthotic shoes bilaterally Restrictions Weight Bearing Restrictions: No    Mobility  Bed Mobility Overal bed mobility: Needs Assistance Bed Mobility: Supine to Sit     Supine to sit: Supervision     General bed mobility comments: Pt able to achieve sitting edge of bed unassisted, SBA assistance for safety.    Transfers Overall transfer level: Needs assistance Equipment used: Bilateral platform walker Transfers: Sit to/from Stand Sit to Stand: Min guard;From elevated surface         General transfer comment: Min guard with  extended time and effort, mobility improving   Ambulation/Gait Ambulation/Gait assistance: Supervision Ambulation Distance (Feet): 300 Feet Assistive device: Bilateral platform walker Gait Pattern/deviations: Step-through pattern;Decreased step length - right;Decreased step length - left;Trunk flexed   Gait velocity interpretation: Below normal speed for age/gender General Gait Details: Cues for upper trunk control and maintaining close proximity to RW to improve posture.  Pt with mild difficulty turning and catching foot on RW but able to correct and right balance.     Stairs            Wheelchair Mobility    Modified Rankin (Stroke Patients Only)       Balance Overall balance assessment: Needs assistance   Sitting balance-Leahy Scale: Good       Standing balance-Leahy Scale: Fair                              Cognition Arousal/Alertness: Awake/alert Behavior During Therapy: WFL for tasks assessed/performed Overall Cognitive Status: Within Functional Limits for tasks assessed                                        Exercises      General Comments        Pertinent Vitals/Pain Pain Assessment: 0-10 Pain Score: 6  Pain Location: Back of knees, bilateral LEs Pain Descriptors / Indicators: Sore Pain Intervention(s): Limited activity within patient's tolerance;Monitored during session    Home Living  Prior Function            PT Goals (current goals can now be found in the care plan section) Acute Rehab PT Goals Patient Stated Goal: to get stronger Potential to Achieve Goals: Good Progress towards PT goals: Progressing toward goals    Frequency    Min 3X/week      PT Plan Current plan remains appropriate    Co-evaluation              AM-PAC PT "6 Clicks" Daily Activity  Outcome Measure  Difficulty turning over in bed (including adjusting bedclothes, sheets and blankets)?:  Unable Difficulty moving from lying on back to sitting on the side of the bed? : Unable Difficulty sitting down on and standing up from a chair with arms (e.g., wheelchair, bedside commode, etc,.)?: Unable Help needed moving to and from a bed to chair (including a wheelchair)?: A Little Help needed walking in hospital room?: A Little Help needed climbing 3-5 steps with a railing? : A Lot 6 Click Score: 11    End of Session Equipment Utilized During Treatment: Gait belt;Oxygen Activity Tolerance: Patient tolerated treatment well Patient left: in bed;with call bell/phone within reach;with family/visitor present(sitting at edge of bed requesting NT. ) Nurse Communication: Mobility status PT Visit Diagnosis: Muscle weakness (generalized) (M62.81);Difficulty in walking, not elsewhere classified (R26.2)     Time: 0350-0938 PT Time Calculation (min) (ACUTE ONLY): 22 min  Charges:  $Gait Training: 8-22 mins                    G Codes:       Joycelyn Rua, PTA pager [phone]    Florestine Avers 11/12/2017, 11:55 AM

## 2017-11-14 ENCOUNTER — Telehealth: Payer: Self-pay | Admitting: *Deleted

## 2017-11-14 LAB — CULTURE, BLOOD (ROUTINE X 2)
Culture: NO GROWTH
Culture: NO GROWTH
SPECIAL REQUESTS: ADEQUATE
Special Requests: ADEQUATE

## 2017-11-14 NOTE — Telephone Encounter (Signed)
thx

## 2017-11-14 NOTE — Telephone Encounter (Signed)
DIANA ARMIJO PVV:[id number] DOB: 08/30/1950 DOA: 11/08/2017  PCP: Wanda Plump, MD  Admit date: 11/08/2017 Discharge date: 11/12/2017  Admitted From: Home Disposition: Home  Recommendations for Outpatient Follow-up:  1. Follow up with PCP in 1 week 2. Please obtain BMP/CBC in one week 3. Please follow up on the following pending results: None  Home Health: PT, OT Equipment/Devices: None  Discharge Condition: Stable CODE STATUS: Full code Diet recommendation: Heart healthy   Transition Care Management Follow-up Telephone Call  How have you been since you were released from the hospital? "doing better"    Do you understand why you were in the hospital? yes   Do you understand the discharge instructions? yes   Where were you discharged to? Home with wife   Items Reviewed:  Medications reviewed: yes  Allergies reviewed: yes  Dietary changes reviewed: yes  Referrals reviewed: yes   Functional Questionnaire:   Activities of Daily Living (ADLs):   He states they are independent in the following: ambulation, bathing and hygiene, feeding, continence, grooming, toileting and dressing States they require assistance with the following: using walker occasionally.   Any transportation issues/concerns?: no  Any patient concerns? No   Confirmed importance and date/time of follow-up visits scheduled yes Provider Appointment booked with Dr.Paz 11/18/17 @140   Confirmed with patient if condition begins to worsen call PCP or go to the ER.  Patient was given the office number and encouraged to call back with question or concerns.  : yes

## 2017-11-18 ENCOUNTER — Encounter: Payer: Self-pay | Admitting: Internal Medicine

## 2017-11-18 ENCOUNTER — Ambulatory Visit (INDEPENDENT_AMBULATORY_CARE_PROVIDER_SITE_OTHER): Payer: Medicare HMO | Admitting: Internal Medicine

## 2017-11-18 VITALS — BP 124/66 | HR 66 | Temp 97.7°F | Resp 14 | Ht 74.0 in | Wt 205.0 lb

## 2017-11-18 DIAGNOSIS — M059 Rheumatoid arthritis with rheumatoid factor, unspecified: Secondary | ICD-10-CM | POA: Diagnosis not present

## 2017-11-18 DIAGNOSIS — R509 Fever, unspecified: Secondary | ICD-10-CM | POA: Diagnosis not present

## 2017-11-18 LAB — CBC WITH DIFFERENTIAL/PLATELET
BASOS PCT: 0.4 % (ref 0.0–3.0)
Basophils Absolute: 0 10*3/uL (ref 0.0–0.1)
EOS ABS: 0 10*3/uL (ref 0.0–0.7)
Eosinophils Relative: 0.1 % (ref 0.0–5.0)
HCT: 38.3 % — ABNORMAL LOW (ref 39.0–52.0)
HEMOGLOBIN: 13 g/dL (ref 13.0–17.0)
Lymphocytes Relative: 47.4 % — ABNORMAL HIGH (ref 12.0–46.0)
Lymphs Abs: 1.4 10*3/uL (ref 0.7–4.0)
MCHC: 33.9 g/dL (ref 30.0–36.0)
MCV: 88.8 fl (ref 78.0–100.0)
MONO ABS: 0.5 10*3/uL (ref 0.1–1.0)
Monocytes Relative: 17.2 % — ABNORMAL HIGH (ref 3.0–12.0)
NEUTROS PCT: 34.9 % — AB (ref 43.0–77.0)
Neutro Abs: 1 10*3/uL — ABNORMAL LOW (ref 1.4–7.7)
Platelets: 189 10*3/uL (ref 150.0–400.0)
RBC: 4.31 Mil/uL (ref 4.22–5.81)
RDW: 17 % — AB (ref 11.5–15.5)
WBC: 3 10*3/uL — AB (ref 4.0–10.5)

## 2017-11-18 LAB — SEDIMENTATION RATE: SED RATE: 28 mm/h — AB (ref 0–20)

## 2017-11-18 LAB — HIGH SENSITIVITY CRP: CRP HIGH SENSITIVITY: 24.44 mg/L — AB (ref 0.000–5.000)

## 2017-11-18 NOTE — Progress Notes (Signed)
Pre visit review using our clinic review tool, if applicable. No additional management support is needed unless otherwise documented below in the visit note. 

## 2017-11-18 NOTE — Patient Instructions (Signed)
  GO TO THE LAB : Get the blood work     

## 2017-11-18 NOTE — Progress Notes (Signed)
Subjective:    Patient ID: Joseph [name], male    DOB: 04-Aug-1951, 67 y.o.   MRN: [id number]  DOS:  11/18/2017 Type of visit - description : TCM 7 Interval history: Admitted to the hospital and discharge 11/12/2017 He was admitted with a febrile illness, temperature that the ER was 100.5, chest x-ray with no acute changes, rapid flu test negative, urinalysis unremarkable, he was felt to have neutropenic fever and was admitted for further eval. At the end, it was felt that the fever was due to a flareup of rheumatoid arthritis, at the time the patient was experiencing increase arthralgias from baseline.   Review of Systems Since he left the hospital he is feeling better. Good appetite No nausea, vomiting, diarrhea Pain management with oxycodone. He was recommended to take prednisone 20 mg daily but is only taking 15 until see he is his rheumatologist. He is checking his blood pressure and O2 sat on daily basis: Always normal.   Past Medical History:  Diagnosis Date  . CAD (coronary artery disease)    MI 2007, CABG  . Cataracts, bilateral   . Complication of anesthesia    DIFFICULTY AFTER CABG HAD CONFUSION POST OP  . COPD (chronic obstructive pulmonary disease) (HCC)   . Diabetes mellitus    Dx 07-2008 A1C 6.2  . Esophageal dysmotility   . Fatty liver 10/11/09  . Gastroesophageal reflux disease   . Gastroparesis   . Hiatal hernia   . Hypertension   . Neuropathy    (related to RA per neuro note 05-2011)  . Onychomycosis   . Osteoporosis    per DEXA 05-2008  . Oxygen dependent    3l  . Peripheral polyneuropathy    Severe, causing problems with his feet, see surgeries  . Psoriasis   . Pulmonary nodules   . Rheumatoid arthritis (HCC)    [facility]  . Septic shock (HCC) 07/2017    Past Surgical History:  Procedure Laterality Date  . A-FLUTTER ABLATION N/A 11/27/2016   Procedure: A-Flutter Ablation;  Surgeon: Marinus Maw, MD;  Location: [facility] INVASIVE CV LAB;   Service: Cardiovascular;  Laterality: N/A;  . achiles tendon surgery  8/09  . CARDIAC CATHETERIZATION N/A 01/23/2016   Procedure: Left Heart Cath and Cors/Grafts Angiography;  Surgeon: Lyn Records, MD;  Location: [facility] INVASIVE CV LAB;  Service: Cardiovascular;  Laterality: N/A;  . CARDIOVERSION N/A 10/23/2016   Procedure: CARDIOVERSION;  Surgeon: Rollene Rotunda, MD;  Location: [facility] ENDOSCOPY;  Service: Cardiovascular;  Laterality: N/A;  . CATARACT EXTRACTION Right 03/2017  . CATARACT EXTRACTION Left 05/2017  . CHOLECYSTECTOMY    . CORONARY ARTERY BYPASS GRAFT  2007   (LIMA to the LAD, left radial to obtuse marginal, SVG to first diagonal, SVG to PDA. His ejection fraction to 50-55%  . ESOPHAGOGASTRODUODENOSCOPY N/A 10/19/2012   Procedure: ESOPHAGOGASTRODUODENOSCOPY (EGD);  Surgeon: Hart Carwin, MD;  Location: [facility] ENDOSCOPY;  Service: Endoscopy;  Laterality: N/A;  the dilatation is possible.   Marland Kitchen FOOT SURGERY     to tendon release and repair of osteomyelitis   . LARYNGOSCOPY N/A 01/21/2014   Procedure: LARYNGOSCOPY;  Surgeon: Christia Reading, MD;  Location: [facility] OR;  Service: ENT;  Laterality: N/A;  micro direct laryngoscopy with prolaryn injection/jet venturi ventilation  . TOE AMPUTATION  4-12/ 3 14   d/t a deformity, found to have osteomyelitis, complicated by post-op foot strtess FX    Social History   Socioeconomic History  . Marital status: Married  Spouse name: Not on file  . Number of children: 0  . Years of education: Not on file  . Highest education level: Not on file  Occupational History  . Occupation: Disabled-retired    Associate Professor: UNEMPLOYED  Social Needs  . Financial resource strain: Not on file  . Food insecurity:    Worry: Not on file    Inability: Not on file  . Transportation needs:    Medical: Not on file    Non-medical: Not on file  Tobacco Use  . Smoking status: Former Smoker    Packs/day: 1.00    Years: 40.00    Pack years: 40.00    Last attempt to quit:  08/26/2005    Years since quitting: 12.2  . Smokeless tobacco: Former Neurosurgeon    Quit date: 04/02/2006  . Tobacco comment: smoked x 40 years, 1 ppd  Substance and Sexual Activity  . Alcohol use: No  . Drug use: No  . Sexual activity: Not on file  Lifestyle  . Physical activity:    Days per week: Not on file    Minutes per session: Not on file  . Stress: Not on file  Relationships  . Social connections:    Talks on phone: Not on file    Gets together: Not on file    Attends religious service: Not on file    Active member of club or organization: Not on file    Attends meetings of clubs or organizations: Not on file    Relationship status: Not on file  . Intimate partner violence:    Fear of current or ex partner: Not on file    Emotionally abused: Not on file    Physically abused: Not on file    Forced sexual activity: Not on file  Other Topics Concern  . Not on file  Social History Narrative   Household- pt and wife      Allergies as of 11/18/2017   No Known Allergies     Medication List        Accurate as of 11/18/17 11:59 PM. Always use your most recent med list.          albuterol 108 (90 Base) MCG/ACT inhaler Commonly known as:  PROAIR HFA Inhale 2 puffs into the lungs every 4 (four) hours as needed for wheezing or shortness of breath.   aspirin EC 81 MG tablet Take 81 mg by mouth daily.   atorvastatin 80 MG tablet Commonly known as:  LIPITOR TAKE 1 TABLET BY MOUTH DAILY   dextromethorphan-guaiFENesin 30-600 MG 12hr tablet Commonly known as:  MUCINEX DM Take 1 tablet by mouth 2 (two) times daily.   folic acid 800 MCG tablet Commonly known as:  FOLVITE Take 800 mcg by mouth daily.   furosemide 20 MG tablet Commonly known as:  LASIX Take 3 tablets (60 mg total) by mouth daily.   ipratropium-albuterol 0.5-2.5 (3) MG/3ML Soln Commonly known as:  DUONEB Take 3 mLs by nebulization every 4 (four) hours as needed.   KLOR-CON 10 10 MEQ tablet Generic drug:   potassium chloride TAKE 4 TABLETS BY MOUTH EVERY MORNING AND TAKE 2 TABLETS EVERY EVENING   LOTEMAX 0.5 % Gel Generic drug:  Loteprednol Etabonate Place 1 drop into both eyes every morning. daily   Melatonin 1 MG Caps Take 4 mg by mouth as needed (Sleep).   nitroGLYCERIN 0.4 MG SL tablet Commonly known as:  NITROSTAT Place 1 tablet (0.4 mg total) under the tongue every  5 (five) minutes as needed for chest pain (up to 3 doses for severe chest pain. If no relief after 3rd dose, proceed to the ED for an evaluation).   oxyCODONE-acetaminophen 7.5-325 MG tablet Commonly known as:  PERCOCET Take 1 tablet by mouth every 6 (six) hours as needed for moderate pain.   OXYGEN Inhale 3 L into the lungs continuous.   pantoprazole 40 MG tablet Commonly known as:  PROTONIX Take 1 tablet (40 mg total) by mouth 2 (two) times daily.   PERFECT IRON PO Take 1 tablet by mouth every morning.   predniSONE 20 MG tablet Commonly known as:  DELTASONE Take 15 mg by mouth daily with breakfast.   pregabalin 75 MG capsule Commonly known as:  LYRICA Take 75 mg by mouth 2 (two) times daily.   sodium chloride 0.65 % Soln nasal spray Commonly known as:  OCEAN Place 1 spray into both nostrils as needed for congestion.   SUPER B COMPLEX PO Take 1 tablet by mouth daily.   TRELEGY ELLIPTA 100-62.5-25 MCG/INH Aepb Generic drug:  Fluticasone-Umeclidin-Vilant Inhale 1 puff into the lungs daily.   vitamin C 500 MG tablet Commonly known as:  ASCORBIC ACID Take 500 mg by mouth daily.   VITEYES AREDS ADVANCED PO Take 1 tablet by mouth daily.          Objective:   Physical Exam BP 124/66 (BP Location: Left Arm, Patient Position: Sitting, Cuff Size: Small)   Pulse 66   Temp 97.7 F (36.5 C) (Oral)   Resp 14   Ht 6\' 2"  (1.88 m)   Wt 205 lb (93 kg)   SpO2 96%   BMI 26.32 kg/m  General:   Well developed, well nourished . NAD.  HEENT:  Normocephalic . Face symmetric, atraumatic Lungs:  CTA  B Normal respiratory effort, no intercostal retractions, no accessory muscle use. Heart: RRR,  no murmur.  no pretibial edema bilaterally  Abdomen:  Not distended, soft, non-tender. No rebound or rigidity.   Skin: Not pale. Not jaundice Neurologic:  alert & oriented X3.  Speech normal, gait not tested Psych--  Cognition and judgment appear intact.  Cooperative with normal attention span and concentration.  Behavior appropriate. No anxious or depressed appearing.     Assessment & Plan:    Assessment  MSK: --RA --- Dr --Psoriasis --Osteoporosis Peripheral neuropathy, severe, several feet surgeries DM HTN Hyperlipidemia Pulmonary: --COPD --Pulmonary fibrosis --O2 24/ 7 --Chronic respiratory failure CV: --CAD, CHF, MI and  CABG 2007 --CP- CT chest no PE, cath 01-23-16, one occluded graft, rx medical mngmt  dx: question of pericarditis  --Pericarditis, admitted 01/2016 --New  onset A. Flutter 09-2016, admitted. Ablation 4-18, 12-2016: d/c OAC, rx asa 81; restart OAC if flutter return GI --Barrettts --GERD --esophageal dysmotility --Fatty liver Hoarseness , chronic, saw ENT , s/p botox 2015 , helped tempoarrily  Has a Hospital bed   PLAN Febrile illness: Was admitted to the hospital with persistent fever, workup was negative, they did not do a echocardiogram (he had one on 07-2017)   He was temporarily on antibiotics, cultures were negative and consequently ABX discontinued. Fever felt to be a RA exacerbation.  While in the hospital he did not get IV steroids, he actually was continue on oral prednisone. He was discharge on 20 mg daily after they consulted his rheumatologist.  Patient however elected to only take 15  mg daily.  In addition to his taking oxycodone for pain control. Currently feels very well. We  will check a CBC, sed rate and CRP.  Will share results with the patient and his rheumatologist. Last BMP satisfactory. R.A.:  As above.

## 2017-11-19 ENCOUNTER — Telehealth: Payer: Self-pay | Admitting: *Deleted

## 2017-11-19 DIAGNOSIS — J439 Emphysema, unspecified: Secondary | ICD-10-CM | POA: Diagnosis not present

## 2017-11-19 DIAGNOSIS — I110 Hypertensive heart disease with heart failure: Secondary | ICD-10-CM | POA: Diagnosis not present

## 2017-11-19 DIAGNOSIS — M810 Age-related osteoporosis without current pathological fracture: Secondary | ICD-10-CM | POA: Diagnosis not present

## 2017-11-19 DIAGNOSIS — L409 Psoriasis, unspecified: Secondary | ICD-10-CM | POA: Diagnosis not present

## 2017-11-19 DIAGNOSIS — E1143 Type 2 diabetes mellitus with diabetic autonomic (poly)neuropathy: Secondary | ICD-10-CM | POA: Diagnosis not present

## 2017-11-19 DIAGNOSIS — I4892 Unspecified atrial flutter: Secondary | ICD-10-CM | POA: Diagnosis not present

## 2017-11-19 DIAGNOSIS — E1142 Type 2 diabetes mellitus with diabetic polyneuropathy: Secondary | ICD-10-CM | POA: Diagnosis not present

## 2017-11-19 DIAGNOSIS — M069 Rheumatoid arthritis, unspecified: Secondary | ICD-10-CM | POA: Diagnosis not present

## 2017-11-19 DIAGNOSIS — I2510 Atherosclerotic heart disease of native coronary artery without angina pectoris: Secondary | ICD-10-CM | POA: Diagnosis not present

## 2017-11-19 DIAGNOSIS — J9611 Chronic respiratory failure with hypoxia: Secondary | ICD-10-CM | POA: Diagnosis not present

## 2017-11-19 NOTE — Telephone Encounter (Signed)
Per lab result note, pt was contacted by CMA and results were left on his voicemail.  Copied from CRM [id number]. Topic: General - Call Back - No Documentation >> Nov 19, 2017 10:23 AM Windy Kalata, NT wrote: Patient is calling and states he missed a call from the office. Please advise.

## 2017-11-19 NOTE — Telephone Encounter (Signed)
Pt calling back letting office know he did receive the lab results on his vm and doesn't need a call back.

## 2017-11-19 NOTE — Telephone Encounter (Signed)
Noted  

## 2017-11-19 NOTE — Assessment & Plan Note (Addendum)
Febrile illness: Was admitted to the hospital with persistent fever, workup was negative, they did not do a echocardiogram (he had one on 07-2017)   He was temporarily on antibiotics, cultures were negative and consequently ABX discontinued. Fever felt to be a RA exacerbation.  While in the hospital he did not get IV steroids, he actually was continue on oral prednisone. He was discharge on 20 mg daily after they consulted his rheumatologist.  Patient however elected to only take 15  mg daily.  In addition to his taking oxycodone for pain control. Currently feels very well. We will check a CBC, sed rate and CRP.  Will share results with the patient and his rheumatologist. Last BMP satisfactory. R.A.:  As above.

## 2017-11-20 ENCOUNTER — Ambulatory Visit: Admit: 2017-11-20 | Discharge: 2017-11-21 | Payer: MEDICARE

## 2017-11-20 DIAGNOSIS — D709 Neutropenia, unspecified: Principal | ICD-10-CM

## 2017-11-21 ENCOUNTER — Ambulatory Visit: Payer: Medicare HMO | Admitting: Internal Medicine

## 2017-11-21 DIAGNOSIS — M059 Rheumatoid arthritis with rheumatoid factor, unspecified: Secondary | ICD-10-CM | POA: Diagnosis not present

## 2017-11-24 DIAGNOSIS — E1142 Type 2 diabetes mellitus with diabetic polyneuropathy: Secondary | ICD-10-CM | POA: Diagnosis not present

## 2017-11-24 DIAGNOSIS — G894 Chronic pain syndrome: Secondary | ICD-10-CM | POA: Diagnosis not present

## 2017-11-24 DIAGNOSIS — G4700 Insomnia, unspecified: Secondary | ICD-10-CM | POA: Diagnosis not present

## 2017-11-24 DIAGNOSIS — M069 Rheumatoid arthritis, unspecified: Secondary | ICD-10-CM | POA: Diagnosis not present

## 2017-11-27 DIAGNOSIS — E1142 Type 2 diabetes mellitus with diabetic polyneuropathy: Secondary | ICD-10-CM | POA: Diagnosis not present

## 2017-11-27 DIAGNOSIS — I110 Hypertensive heart disease with heart failure: Secondary | ICD-10-CM | POA: Diagnosis not present

## 2017-11-27 DIAGNOSIS — I4892 Unspecified atrial flutter: Secondary | ICD-10-CM | POA: Diagnosis not present

## 2017-11-27 DIAGNOSIS — L409 Psoriasis, unspecified: Secondary | ICD-10-CM | POA: Diagnosis not present

## 2017-11-27 DIAGNOSIS — J9611 Chronic respiratory failure with hypoxia: Secondary | ICD-10-CM | POA: Diagnosis not present

## 2017-11-27 DIAGNOSIS — I2510 Atherosclerotic heart disease of native coronary artery without angina pectoris: Secondary | ICD-10-CM | POA: Diagnosis not present

## 2017-11-27 DIAGNOSIS — M810 Age-related osteoporosis without current pathological fracture: Secondary | ICD-10-CM | POA: Diagnosis not present

## 2017-11-27 DIAGNOSIS — M069 Rheumatoid arthritis, unspecified: Secondary | ICD-10-CM | POA: Diagnosis not present

## 2017-11-27 DIAGNOSIS — J439 Emphysema, unspecified: Secondary | ICD-10-CM | POA: Diagnosis not present

## 2017-11-27 DIAGNOSIS — E1143 Type 2 diabetes mellitus with diabetic autonomic (poly)neuropathy: Secondary | ICD-10-CM | POA: Diagnosis not present

## 2017-11-28 ENCOUNTER — Ambulatory Visit: Admit: 2017-11-28 | Discharge: 2017-11-29 | Payer: MEDICARE | Attending: Rheumatology | Primary: Rheumatology

## 2017-11-28 DIAGNOSIS — M059 Rheumatoid arthritis with rheumatoid factor, unspecified: Secondary | ICD-10-CM

## 2017-11-28 DIAGNOSIS — M05771 Rheumatoid arthritis with rheumatoid factor of right ankle and foot without organ or systems involvement: Secondary | ICD-10-CM

## 2017-11-28 DIAGNOSIS — M05741 Rheumatoid arthritis with rheumatoid factor of right hand without organ or systems involvement: Secondary | ICD-10-CM

## 2017-11-28 DIAGNOSIS — M05772 Rheumatoid arthritis with rheumatoid factor of left ankle and foot without organ or systems involvement: Secondary | ICD-10-CM

## 2017-11-28 DIAGNOSIS — M069 Rheumatoid arthritis, unspecified: Principal | ICD-10-CM

## 2017-11-28 DIAGNOSIS — D708 Other neutropenia: Secondary | ICD-10-CM

## 2017-11-28 DIAGNOSIS — M05742 Rheumatoid arthritis with rheumatoid factor of left hand without organ or systems involvement: Secondary | ICD-10-CM

## 2017-11-28 DIAGNOSIS — Z6828 Body mass index (BMI) 28.0-28.9, adult: Secondary | ICD-10-CM | POA: Diagnosis not present

## 2017-11-28 MED ORDER — PREDNISONE 10 MG TABLET
ORAL_TABLET | 5 refills | 0 days | Status: CP
Start: 2017-11-28 — End: 2018-01-30

## 2017-11-28 MED ORDER — METHOTREXATE SODIUM 25 MG/ML INJECTION SOLUTION
SUBCUTANEOUS | 4 refills | 0 days | Status: CP
Start: 2017-11-28 — End: 2018-01-09

## 2017-11-28 MED ORDER — PREDNISONE 5 MG TABLET
ORAL_TABLET | 6 refills | 0 days | Status: CP
Start: 2017-11-28 — End: 2018-01-30

## 2017-12-01 ENCOUNTER — Encounter: Payer: Self-pay | Admitting: Pulmonary Disease

## 2017-12-01 ENCOUNTER — Ambulatory Visit: Payer: Medicare HMO | Admitting: Pulmonary Disease

## 2017-12-01 VITALS — BP 116/72 | HR 89 | Ht 74.0 in | Wt 209.0 lb

## 2017-12-01 DIAGNOSIS — M810 Age-related osteoporosis without current pathological fracture: Secondary | ICD-10-CM | POA: Diagnosis not present

## 2017-12-01 DIAGNOSIS — J479 Bronchiectasis, uncomplicated: Secondary | ICD-10-CM

## 2017-12-01 DIAGNOSIS — E1143 Type 2 diabetes mellitus with diabetic autonomic (poly)neuropathy: Secondary | ICD-10-CM | POA: Diagnosis not present

## 2017-12-01 DIAGNOSIS — I2510 Atherosclerotic heart disease of native coronary artery without angina pectoris: Secondary | ICD-10-CM | POA: Diagnosis not present

## 2017-12-01 DIAGNOSIS — J439 Emphysema, unspecified: Secondary | ICD-10-CM | POA: Diagnosis not present

## 2017-12-01 DIAGNOSIS — E1142 Type 2 diabetes mellitus with diabetic polyneuropathy: Secondary | ICD-10-CM | POA: Diagnosis not present

## 2017-12-01 DIAGNOSIS — J9611 Chronic respiratory failure with hypoxia: Secondary | ICD-10-CM

## 2017-12-01 DIAGNOSIS — L409 Psoriasis, unspecified: Secondary | ICD-10-CM | POA: Diagnosis not present

## 2017-12-01 DIAGNOSIS — M069 Rheumatoid arthritis, unspecified: Secondary | ICD-10-CM | POA: Diagnosis not present

## 2017-12-01 DIAGNOSIS — I4892 Unspecified atrial flutter: Secondary | ICD-10-CM | POA: Diagnosis not present

## 2017-12-01 DIAGNOSIS — I110 Hypertensive heart disease with heart failure: Secondary | ICD-10-CM | POA: Diagnosis not present

## 2017-12-01 MED ORDER — TRELEGY ELLIPTA 100-62.5-25 MCG/INH IN AEPB: 1.0000 | INHALATION_SPRAY | Freq: Every day | RESPIRATORY_TRACT | 5 refills | Status: AC

## 2017-12-01 MED ORDER — ALBUTEROL SULFATE HFA 108 (90 BASE) MCG/ACT IN AERS: 2.0000 | INHALATION_SPRAY | RESPIRATORY_TRACT | 5 refills | Status: AC | PRN

## 2017-12-01 MED ORDER — IPRATROPIUM-ALBUTEROL 0.5-2.5 (3) MG/3ML IN SOLN: 3.0000 mL | RESPIRATORY_TRACT | 5 refills | Status: AC | PRN

## 2017-12-01 NOTE — Progress Notes (Signed)
[facility]  Chief Complaint  Patient presents with  . office follow up    Pt admitted in [facility] 3/16-3/20/2019 for RA flare up, pt states doing okay    Vital signs: BP 116/72 (BP Location: Left Arm, Cuff Size: Normal)   Pulse 89   Ht 6\' 2"  (1.88 m)   Wt 209 lb (94.8 kg)   SpO2 94%   BMI 26.83 kg/m   History of Present Illness: Joseph [name] is a 67 y.o. male former smoker with COPD/emphysema, rheumatoid arthritis, recurrent pneumonia 2nd to chronic aspiration/reflux, bronchiectasis, chronic respiratory failure on 3 liters oxygen 24/7.  He was in hospital again for flare of RA.  Rheumatology is planning to restart MTX to wean down prednisone, and use plaquenil   He is not having cough, wheeze, or sputum.  Chest PT helps.  He finds he needs to use oxygen more regularly.  He will move to [facility] next month.  He has PCP set up, and will get referrals from there.  Physical Exam:  General - pleasant, wearing oxygen Eyes - pupils reactive ENT - no sinus tenderness, no oral exudate, no LAN Cardiac - regular, no murmur Chest - no wheeze, rales Abd - soft, non tender Ext - extensive changes from RA Skin - no rashes Neuro - normal strength Psych - normal mood   Assessment/Plan:  COPD with bullous emphysema. - continue trelegy and prn albuterol  Bronchiectasis. - stable - continue bronchial hygiene  Recurrent aspiration PNA with hx of GERD, HH, and gastroparesis. - he will need to f/u with GI  Chronic respiratory failure. - Related to COPD/Emphysema. - continue 3 liters oxygen  Hx of Rheumatoid arthritis. - he is followed by [facility] at [facility] - I explained to him that there wasn't any concern for pulmonary toxicity related to methotrexate, and no pulmonary contraindication for him to start methotrexate again.  Main concern going forward with methotrexate relates to its impact on his WBC count and risk of  infection  Leukopenia. - f/u with hematology at [facility]  Goals of care. - had extensive discussion - he would be okay with short term trial of intubation in the future if needed - he would not want trach or long term vent support   Patient Instructions  Call if you can help you in anyway   Time spent 32 minutes   [facility], MD [facility] 12/01/2017, 1:00 PM Pager:  [phone]  Flow Sheet  Pulmonary tests PFT 01/12/09 >> FEV1 3.02 (85%), FEV1% 68, TLC 6.35 (88%), DLCO 45%, no BD PFT 12/02/12 >> FEV1 3.11 (78%), FEV1% 77, TLC 6.32 (83%), DLCO 37%, no BD  CT chests CT chest 10/17/12 >> severe centrilobular emphysema, apical bullae, 1 cm nodule LUL new, 5 mm nodule RLL stable since 2009, BTX with GGO at bases CT chest 05/06/13 >> decreased size LUL nodule now 8 mm, 4 mm nodule RLL, 3 mm nodule RML, 7 mm nodule RML, 8 mm nodule lingula CT chest 04/05/14 >> atherosclerosis, moderate diffuse centrilobular and paraseptal emphysema, stable pulmonary nodules with dominant LUL nodule now 7 mm, steatosis of liver with ?early cirrhosis CT chest 05/02/15 >> stable pulmonary nodules since 2009, severe emphysema CT angio chest 01/19/16 >> bullous emphysema CT angio chest 02/23/16 >> new patchy ASD Rt > Lt lower lobes  Cardiac tests Echo 08/15/17 >> EF 55 to 60%, grade 1 DD, PAS 35 mmHg  Past Medical History: He  has a  past medical history of CAD (coronary artery disease), Cataracts, bilateral, Complication of anesthesia, COPD (chronic obstructive pulmonary disease) (HCC), Diabetes mellitus, Esophageal dysmotility, Fatty liver (10/11/09), Gastroesophageal reflux disease, Gastroparesis, Hiatal hernia, Hypertension, Neuropathy, Onychomycosis, Osteoporosis, Oxygen dependent, Peripheral polyneuropathy, Psoriasis, Pulmonary nodules, Rheumatoid arthritis (HCC), and Septic shock (HCC) (07/2017).  Past Surgical History: He  has a past surgical history that includes Foot surgery;  Cholecystectomy; Coronary artery bypass graft (2007); achiles tendon surgery (8/09); Toe amputation (4-12/ 3 14); Esophagogastroduodenoscopy (N/A, 10/19/2012); Laryngoscopy (N/A, 01/21/2014); Cardiac catheterization (N/A, 01/23/2016); Cardioversion (N/A, 10/23/2016); A-FLUTTER ABLATION (N/A, 11/27/2016); Cataract extraction (Right, 03/2017); and Cataract extraction (Left, 05/2017).  Family History: His family history includes Diverticulitis in his mother; Heart attack in his father; Heart disease in his father and mother.  Social History: He  reports that he quit smoking about 12 years ago. He has a 40.00 pack-year smoking history. He quit smokeless tobacco use about 11 years ago. He reports that he does not drink alcohol or use drugs.  Medications: Allergies as of 12/01/2017   No Known Allergies     Medication List        Accurate as of 12/01/17  1:00 PM. Always use your most recent med list.          albuterol 108 (90 Base) MCG/ACT inhaler Commonly known as:  PROAIR HFA Inhale 2 puffs into the lungs every 4 (four) hours as needed for wheezing or shortness of breath.   aspirin EC 81 MG tablet Take 81 mg by mouth daily.   atorvastatin 80 MG tablet Commonly known as:  LIPITOR TAKE 1 TABLET BY MOUTH DAILY   dextromethorphan-guaiFENesin 30-600 MG 12hr tablet Commonly known as:  MUCINEX DM Take 1 tablet by mouth 2 (two) times daily.   folic acid 800 MCG tablet Commonly known as:  FOLVITE Take 800 mcg by mouth daily.   furosemide 20 MG tablet Commonly known as:  LASIX Take 3 tablets (60 mg total) by mouth daily.   ipratropium-albuterol 0.5-2.5 (3) MG/3ML Soln Commonly known as:  DUONEB Take 3 mLs by nebulization every 4 (four) hours as needed (shortness of breath or wheezing).   KLOR-CON 10 10 MEQ tablet Generic drug:  potassium chloride TAKE 4 TABLETS BY MOUTH EVERY MORNING AND TAKE 2 TABLETS EVERY EVENING   LOTEMAX 0.5 % Gel Generic drug:  Loteprednol Etabonate Place 1 drop  into both eyes every morning. daily   Melatonin 1 MG Caps Take 4 mg by mouth as needed (Sleep).   nitroGLYCERIN 0.4 MG SL tablet Commonly known as:  NITROSTAT Place 1 tablet (0.4 mg total) under the tongue every 5 (five) minutes as needed for chest pain (up to 3 doses for severe chest pain. If no relief after 3rd dose, proceed to the ED for an evaluation).   oxyCODONE-acetaminophen 7.5-325 MG tablet Commonly known as:  PERCOCET Take 1 tablet by mouth every 6 (six) hours as needed for moderate pain.   OXYGEN Inhale 3 L into the lungs continuous.   pantoprazole 40 MG tablet Commonly known as:  PROTONIX Take 1 tablet (40 mg total) by mouth 2 (two) times daily.   PERFECT IRON PO Take 1 tablet by mouth every morning.   predniSONE 20 MG tablet Commonly known as:  DELTASONE Take 15 mg by mouth daily with breakfast.   pregabalin 75 MG capsule Commonly known as:  LYRICA Take 75 mg by mouth 2 (two) times daily.   sodium chloride 0.65 % Soln nasal spray Commonly known as:  OCEAN Place 1 spray into both nostrils as needed for congestion.   SUPER B COMPLEX PO Take 1 tablet by mouth daily.   TRELEGY ELLIPTA 100-62.5-25 MCG/INH Aepb Generic drug:  Fluticasone-Umeclidin-Vilant Inhale 1 puff into the lungs daily.   vitamin C 500 MG tablet Commonly known as:  ASCORBIC ACID Take 500 mg by mouth daily.   VITEYES AREDS ADVANCED PO Take 1 tablet by mouth daily.

## 2017-12-01 NOTE — Patient Instructions (Signed)
Call if you can help you in anyway

## 2017-12-02 DIAGNOSIS — M2041 Other hammer toe(s) (acquired), right foot: Secondary | ICD-10-CM | POA: Diagnosis not present

## 2017-12-02 DIAGNOSIS — E13621 Other specified diabetes mellitus with foot ulcer: Secondary | ICD-10-CM | POA: Diagnosis not present

## 2017-12-02 DIAGNOSIS — E1142 Type 2 diabetes mellitus with diabetic polyneuropathy: Secondary | ICD-10-CM | POA: Diagnosis not present

## 2017-12-02 DIAGNOSIS — L97512 Non-pressure chronic ulcer of other part of right foot with fat layer exposed: Secondary | ICD-10-CM | POA: Diagnosis not present

## 2017-12-02 DIAGNOSIS — M0579 Rheumatoid arthritis with rheumatoid factor of multiple sites without organ or systems involvement: Secondary | ICD-10-CM | POA: Diagnosis not present

## 2017-12-03 ENCOUNTER — Telehealth: Payer: Self-pay | Admitting: *Deleted

## 2017-12-03 DIAGNOSIS — I110 Hypertensive heart disease with heart failure: Secondary | ICD-10-CM | POA: Diagnosis not present

## 2017-12-03 DIAGNOSIS — L409 Psoriasis, unspecified: Secondary | ICD-10-CM | POA: Diagnosis not present

## 2017-12-03 DIAGNOSIS — M810 Age-related osteoporosis without current pathological fracture: Secondary | ICD-10-CM | POA: Diagnosis not present

## 2017-12-03 DIAGNOSIS — I4892 Unspecified atrial flutter: Secondary | ICD-10-CM | POA: Diagnosis not present

## 2017-12-03 DIAGNOSIS — E1143 Type 2 diabetes mellitus with diabetic autonomic (poly)neuropathy: Secondary | ICD-10-CM | POA: Diagnosis not present

## 2017-12-03 DIAGNOSIS — J439 Emphysema, unspecified: Secondary | ICD-10-CM | POA: Diagnosis not present

## 2017-12-03 DIAGNOSIS — M069 Rheumatoid arthritis, unspecified: Secondary | ICD-10-CM | POA: Diagnosis not present

## 2017-12-03 DIAGNOSIS — E1142 Type 2 diabetes mellitus with diabetic polyneuropathy: Secondary | ICD-10-CM | POA: Diagnosis not present

## 2017-12-03 DIAGNOSIS — I2510 Atherosclerotic heart disease of native coronary artery without angina pectoris: Secondary | ICD-10-CM | POA: Diagnosis not present

## 2017-12-03 DIAGNOSIS — J9611 Chronic respiratory failure with hypoxia: Secondary | ICD-10-CM | POA: Diagnosis not present

## 2017-12-03 NOTE — Telephone Encounter (Signed)
Received Physician Orders from [facility]; forwarded to provider/SLS 04/09

## 2017-12-08 DIAGNOSIS — J439 Emphysema, unspecified: Secondary | ICD-10-CM | POA: Diagnosis not present

## 2017-12-08 DIAGNOSIS — E1143 Type 2 diabetes mellitus with diabetic autonomic (poly)neuropathy: Secondary | ICD-10-CM | POA: Diagnosis not present

## 2017-12-08 DIAGNOSIS — M810 Age-related osteoporosis without current pathological fracture: Secondary | ICD-10-CM | POA: Diagnosis not present

## 2017-12-08 DIAGNOSIS — E1142 Type 2 diabetes mellitus with diabetic polyneuropathy: Secondary | ICD-10-CM | POA: Diagnosis not present

## 2017-12-08 DIAGNOSIS — J9611 Chronic respiratory failure with hypoxia: Secondary | ICD-10-CM | POA: Diagnosis not present

## 2017-12-08 DIAGNOSIS — I2510 Atherosclerotic heart disease of native coronary artery without angina pectoris: Secondary | ICD-10-CM | POA: Diagnosis not present

## 2017-12-08 DIAGNOSIS — M069 Rheumatoid arthritis, unspecified: Secondary | ICD-10-CM | POA: Diagnosis not present

## 2017-12-08 DIAGNOSIS — L409 Psoriasis, unspecified: Secondary | ICD-10-CM | POA: Diagnosis not present

## 2017-12-08 DIAGNOSIS — I110 Hypertensive heart disease with heart failure: Secondary | ICD-10-CM | POA: Diagnosis not present

## 2017-12-08 DIAGNOSIS — I4892 Unspecified atrial flutter: Secondary | ICD-10-CM | POA: Diagnosis not present

## 2017-12-08 NOTE — Telephone Encounter (Signed)
PT orders signed and faxed to [facility]- [phone]- form sent for scanning.

## 2017-12-10 DIAGNOSIS — R0902 Hypoxemia: Secondary | ICD-10-CM | POA: Diagnosis not present

## 2017-12-10 DIAGNOSIS — Z899 Acquired absence of limb, unspecified: Secondary | ICD-10-CM | POA: Diagnosis not present

## 2017-12-10 DIAGNOSIS — Z951 Presence of aortocoronary bypass graft: Secondary | ICD-10-CM | POA: Diagnosis not present

## 2017-12-10 DIAGNOSIS — I2510 Atherosclerotic heart disease of native coronary artery without angina pectoris: Secondary | ICD-10-CM | POA: Diagnosis not present

## 2017-12-10 DIAGNOSIS — Z7952 Long term (current) use of systemic steroids: Secondary | ICD-10-CM | POA: Diagnosis not present

## 2017-12-10 DIAGNOSIS — M0510 Rheumatoid lung disease with rheumatoid arthritis of unspecified site: Secondary | ICD-10-CM | POA: Diagnosis not present

## 2017-12-10 DIAGNOSIS — M059 Rheumatoid arthritis with rheumatoid factor, unspecified: Secondary | ICD-10-CM | POA: Diagnosis not present

## 2017-12-10 DIAGNOSIS — G603 Idiopathic progressive neuropathy: Secondary | ICD-10-CM | POA: Diagnosis not present

## 2017-12-10 DIAGNOSIS — J432 Centrilobular emphysema: Secondary | ICD-10-CM | POA: Diagnosis not present

## 2017-12-10 DIAGNOSIS — L409 Psoriasis, unspecified: Secondary | ICD-10-CM | POA: Diagnosis not present

## 2017-12-14 ENCOUNTER — Encounter (HOSPITAL_BASED_OUTPATIENT_CLINIC_OR_DEPARTMENT_OTHER): Payer: Self-pay | Admitting: Emergency Medicine

## 2017-12-14 ENCOUNTER — Emergency Department (HOSPITAL_BASED_OUTPATIENT_CLINIC_OR_DEPARTMENT_OTHER)
Admission: EM | Admit: 2017-12-14 | Discharge: 2017-12-14 | Disposition: A | Discharge status: Home / Self Care | Payer: Medicare HMO | Attending: Emergency Medicine | Admitting: Emergency Medicine

## 2017-12-14 ENCOUNTER — Other Ambulatory Visit: Payer: Self-pay

## 2017-12-14 DIAGNOSIS — Z7982 Long term (current) use of aspirin: Secondary | ICD-10-CM | POA: Insufficient documentation

## 2017-12-14 DIAGNOSIS — R000 Tachycardia, unspecified: Secondary | ICD-10-CM | POA: Diagnosis not present

## 2017-12-14 DIAGNOSIS — N390 Urinary tract infection, site not specified: Secondary | ICD-10-CM | POA: Insufficient documentation

## 2017-12-14 DIAGNOSIS — Z87891 Personal history of nicotine dependence: Secondary | ICD-10-CM | POA: Diagnosis not present

## 2017-12-14 DIAGNOSIS — R109 Unspecified abdominal pain: Secondary | ICD-10-CM

## 2017-12-14 DIAGNOSIS — J449 Chronic obstructive pulmonary disease, unspecified: Secondary | ICD-10-CM | POA: Insufficient documentation

## 2017-12-14 DIAGNOSIS — Z951 Presence of aortocoronary bypass graft: Secondary | ICD-10-CM | POA: Insufficient documentation

## 2017-12-14 DIAGNOSIS — Z79899 Other long term (current) drug therapy: Secondary | ICD-10-CM | POA: Diagnosis not present

## 2017-12-14 DIAGNOSIS — R102 Pelvic and perineal pain: Secondary | ICD-10-CM

## 2017-12-14 DIAGNOSIS — R1030 Lower abdominal pain, unspecified: Secondary | ICD-10-CM | POA: Diagnosis not present

## 2017-12-14 DIAGNOSIS — E119 Type 2 diabetes mellitus without complications: Secondary | ICD-10-CM | POA: Diagnosis not present

## 2017-12-14 DIAGNOSIS — I10 Essential (primary) hypertension: Secondary | ICD-10-CM | POA: Diagnosis not present

## 2017-12-14 DIAGNOSIS — R319 Hematuria, unspecified: Secondary | ICD-10-CM | POA: Insufficient documentation

## 2017-12-14 DIAGNOSIS — R8271 Bacteriuria: Secondary | ICD-10-CM | POA: Diagnosis not present

## 2017-12-14 DIAGNOSIS — I2510 Atherosclerotic heart disease of native coronary artery without angina pectoris: Secondary | ICD-10-CM | POA: Insufficient documentation

## 2017-12-14 LAB — COMPREHENSIVE METABOLIC PANEL
ALK PHOS: 60 U/L (ref 38–126)
ALT: 29 U/L (ref 17–63)
ANION GAP: 10 (ref 5–15)
AST: 35 U/L (ref 15–41)
Albumin: 3.4 g/dL — ABNORMAL LOW (ref 3.5–5.0)
BUN: 20 mg/dL (ref 6–20)
CALCIUM: 8.9 mg/dL (ref 8.9–10.3)
CHLORIDE: 102 mmol/L (ref 101–111)
CO2: 24 mmol/L (ref 22–32)
Creatinine, Ser: 0.53 mg/dL — ABNORMAL LOW (ref 0.61–1.24)
GFR calc non Af Amer: >60 mL/min (ref >60)
Glucose, Bld: 130 mg/dL — ABNORMAL HIGH (ref 65–99)
Potassium: 4.1 mmol/L (ref 3.5–5.1)
SODIUM: 136 mmol/L (ref 135–145)
Total Bilirubin: 0.8 mg/dL (ref 0.3–1.2)
Total Protein: 6.6 g/dL (ref 6.5–8.1)

## 2017-12-14 LAB — CBC
HCT: 35.2 % — ABNORMAL LOW (ref 39.0–52.0)
Hemoglobin: 11.8 g/dL — ABNORMAL LOW (ref 13.0–17.0)
MCH: 29.4 pg (ref 26.0–34.0)
MCHC: 33.5 g/dL (ref 30.0–36.0)
MCV: 87.6 fL (ref 78.0–100.0)
PLATELETS: 164 10*3/uL (ref 150–400)
RBC: 4.02 MIL/uL — ABNORMAL LOW (ref 4.22–5.81)
RDW: 16.1 % — AB (ref 11.5–15.5)
WBC: 4.3 10*3/uL (ref 4.0–10.5)

## 2017-12-14 LAB — URINALYSIS, ROUTINE W REFLEX MICROSCOPIC
Bilirubin Urine: NEGATIVE
Glucose, UA: NEGATIVE mg/dL
KETONES UR: NEGATIVE mg/dL
LEUKOCYTES UA: NEGATIVE
NITRITE: NEGATIVE
PH: 6 (ref 5.0–8.0)
Protein, ur: NEGATIVE mg/dL
Specific Gravity, Urine: <1.005 — ABNORMAL LOW (ref 1.005–1.030)

## 2017-12-14 LAB — URINALYSIS, MICROSCOPIC (REFLEX)

## 2017-12-14 MED ORDER — CEPHALEXIN 500 MG PO CAPS: 500.0000 mg | ORAL_CAPSULE | Freq: Three times a day (TID) | ORAL | 0 refills | Status: DC

## 2017-12-14 MED ORDER — CEFTRIAXONE SODIUM 1 G IJ SOLR: 1.0000 g | Freq: Once | INTRAMUSCULAR | Status: DC

## 2017-12-14 MED ORDER — SODIUM CHLORIDE 0.9 % IV SOLN
1.0000 g | Freq: Once | INTRAVENOUS | Status: AC
  Administered 2017-12-14: 1 g via INTRAVENOUS
  Filled 2017-12-14: qty 10

## 2017-12-14 NOTE — ED Triage Notes (Signed)
Patient presents with co mid lower abd pain onset 7 hours ago; denies NVD. Patient has hx of RA and recently started Methotrexate since Dec 2018

## 2017-12-14 NOTE — Discharge Instructions (Signed)
Please contact your provider about close follow up  Please have your urine culture followed up by your pcp.  Return to the emergency department for any new or worsening symptoms

## 2017-12-14 NOTE — ED Provider Notes (Addendum)
[facility] EMERGENCY DEPARTMENT Provider Note   CSN: [id number] Arrival date & time: 12/14/17  0245     History   Chief Complaint Chief Complaint  Patient presents with  . Abdominal Pain    HPI Joseph [name] is a 67 y.o. male.  HPI Patient is a 67 year old male presents to the emergency department with 24 hours of change in his stool consistency.  He is not having diarrhea.  He states his stools are slightly softer.  This evening he began having some mild lower abdominal discomfort without urinary frequency or dysuria.  He is never had discomfort like this before.  He states that not significantly tender to examination but just a mild discomfort.  No back pain or flank pain.  No upper abdominal pain.  Denies nausea or vomiting.  Patient has a history of rheumatoid arthritis and he was recently restarted on his methotrexate.  He has a history of sepsis and septic shock in December 2018.  He has been compliant with his medications.  No altered mental status noticed by his spouse.  No fevers or chills at home.  The patient's ER visit today is prompted given his history of sepsis before in the past and thus he is very in tune to any new symptoms that arise.   Past Medical History:  Diagnosis Date  . CAD (coronary artery disease)    MI 2007, CABG  . Cataracts, bilateral   . Complication of anesthesia    DIFFICULTY AFTER CABG HAD CONFUSION POST OP  . COPD (chronic obstructive pulmonary disease) (HCC)   . Diabetes mellitus    Dx 07-2008 A1C 6.2  . Esophageal dysmotility   . Fatty liver 10/11/09  . Gastroesophageal reflux disease   . Gastroparesis   . Hiatal hernia   . Hypertension   . Neuropathy    (related to RA per neuro note 05-2011)  . Onychomycosis   . Osteoporosis    per DEXA 05-2008  . Oxygen dependent    3l  . Peripheral polyneuropathy    Severe, causing problems with his feet, see surgeries  . Psoriasis   . Pulmonary nodules   . Rheumatoid arthritis  (HCC)    [facility] Rheumatology  . Septic shock (HCC) 07/2017    Patient Active Problem List   Diagnosis Date Noted  . Fever 11/09/2017  . Neutropenic fever (HCC) 11/09/2017  . Sepsis (HCC) 09/02/2017  . Septic shock (HCC) 08/14/2017  . PVC (premature ventricular contraction) 07/29/2017  . Atrial flutter (HCC) 10/02/2016  . Hemoptysis 02/23/2016  . Chronic diastolic heart failure (HCC)   . Troponin level elevated   . Chest pain 01/19/2016  . PCP NOTES >>>>>>>>>>>>>>>>>>>>>>>>>>>>>>>>>>>> 05/16/2015  . Chronic obstructive pulmonary emphysema (HCC) 01/13/2015  . Hepatic steatosis 04/17/2014  . Bronchiectasis (HCC) 05/05/2013  . Recurrent pneumonia 11/30/2012  . Pulmonary nodules 11/30/2012  . Gastroparesis 10/20/2012  . Barrett's esophagus 10/19/2012  . Chronic respiratory failure with hypoxia (HCC) 10/17/2012  . Annual physical exam 05/08/2012  . Sicca syndrome (HCC) 10/11/2011  . CAD (coronary artery disease)   . Carotid stenosis 12/10/2010  . Rheumatoid arthritis with rheumatoid factor (HCC) 11/02/2010  . HOARSENESS 10/22/2010  . DYSLIPIDEMIA 09/12/2009  . Type 2 diabetes, controlled, with neuropathy (HCC) 08/01/2008  . Pain in Soft Tissues of Limb 07/13/2008  . Osteoporosis 07/13/2008  . Coronary atherosclerosis 11/25/2006  . 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 Essential hypertension 09/16/2006  . GERD, Barrett's (09-2012 ), gastroparesis 06/06/2006  .  PSORIASIS 06/06/2006    Past Surgical History:  Procedure Laterality Date  . A-FLUTTER ABLATION N/A 11/27/2016   Procedure: A-Flutter Ablation;  Surgeon: Marinus Maw, MD;  Location: [facility] INVASIVE CV LAB;  Service: Cardiovascular;  Laterality: N/A;  . achiles tendon surgery  8/09  . CARDIAC CATHETERIZATION N/A 01/23/2016   Procedure: Left Heart Cath and Cors/Grafts Angiography;  Surgeon: Lyn Records, MD;  Location: [facility] INVASIVE CV LAB;  Service: Cardiovascular;  Laterality: N/A;  . CARDIOVERSION  N/A 10/23/2016   Procedure: CARDIOVERSION;  Surgeon: Rollene Rotunda, MD;  Location: [facility] ENDOSCOPY;  Service: Cardiovascular;  Laterality: N/A;  . CATARACT EXTRACTION Right 03/2017  . CATARACT EXTRACTION Left 05/2017  . CHOLECYSTECTOMY    . CORONARY ARTERY BYPASS GRAFT  2007   (LIMA to the LAD, left radial to obtuse marginal, SVG to first diagonal, SVG to PDA. His ejection fraction to 50-55%  . ESOPHAGOGASTRODUODENOSCOPY N/A 10/19/2012   Procedure: ESOPHAGOGASTRODUODENOSCOPY (EGD);  Surgeon: Hart Carwin, MD;  Location[facility] ENDOSCOPY;  Service: Endoscopy;  Laterality: N/A;  the dilatation is possible.   Marland Kitchen FOOT SURGERY     to tendon release and repair of osteomyelitis   . LARYNGOSCOPY N/A 01/21/2014   Procedure: LARYNGOSCOPY;  Surgeon: Christia Reading, MD;  Location: [facility] OR;  Service: ENT;  Laterality: N/A;  micro direct laryngoscopy with prolaryn injection/jet venturi ventilation  . TOE AMPUTATION  4-12/ 3 14   d/t a deformity, found to have osteomyelitis, complicated by post-op foot strtess FX        Home Medications    Prior to Admission medications   Medication Sig Start Date End Date Taking? Authorizing Provider  albuterol (PROAIR HFA) 108 (90 Base) MCG/ACT inhaler Inhale 2 puffs into the lungs every 4 (four) hours as needed for wheezing or shortness of breath. 12/01/17   [name], MD  Ascorbic Acid (VITAMIN C) 500 MG tablet Take 500 mg by mouth daily.      [provider]  aspirin EC 81 MG tablet Take 81 mg by mouth daily.    [provider]  atorvastatin (LIPITOR) 80 MG tablet TAKE 1 TABLET BY MOUTH DAILY Patient taking differently: TAKE 1 TABLET (80mg ) BY MOUTH DAILY 11/03/17   01/03/18, MD  B Complex-C (SUPER B COMPLEX PO) Take 1 tablet by mouth daily.     [provider]  Carbonyl Iron (PERFECT IRON PO) Take 1 tablet by mouth every morning.     [provider]  cephALEXin (KEFLEX) 500 MG capsule Take 1 capsule (500 mg total) by mouth 3  (three) times daily. 12/14/17   12/16/17, MD  dextromethorphan-guaiFENesin [facility] DM) 30-600 MG 12hr tablet Take 1 tablet by mouth 2 (two) times daily. Patient taking differently: Take 1 tablet by mouth at bedtime.  08/22/17   08/24/17, MD  folic acid (FOLVITE) 800 MCG tablet Take 800 mcg by mouth daily.    [provider]  furosemide (LASIX) 20 MG tablet Take 3 tablets (60 mg total) by mouth daily. Patient taking differently: Take 20-40 mg by mouth See admin instructions. 40mg  in the morning and 20mg  in the afternoon 09/23/17   , MD  ipratropium-albuterol (DUONEB) 0.5-2.5 (3) MG/3ML SOLN Take 3 mLs by nebulization every 4 (four) hours as needed (shortness of breath or wheezing). 12/01/17   09/25/17, MD  KLOR-CON 10 10 MEQ tablet TAKE 4 TABLETS BY MOUTH EVERY MORNING AND TAKE 2 TABLETS EVERY EVENING Patient taking differently: TAKE 4 TABLETS (  ) BY MOUTH EVERY MORNING AND TAKE 2 TABLETS ( ) EVERY EVENING 10/31/17   Rollene Rotunda, MD  Loteprednol Etabonate (LOTEMAX) 0.5 % GEL Place 1 drop into both eyes every morning. daily 07/01/14   [provider]  Melatonin 1 MG CAPS Take 4 mg by mouth as needed (Sleep).    [provider]  Multiple Vitamins-Minerals (VITEYES AREDS ADVANCED PO) Take 1 tablet by mouth daily.    [provider]  nitroGLYCERIN (NITROSTAT) 0.4 MG SL tablet Place 1 tablet (0.4 mg total) under the tongue every 5 (five) minutes as needed for chest pain (up to 3 doses for severe chest pain. If no relief after 3rd dose, proceed to the ED for an evaluation). 10/04/16   Rollene Rotunda, MD  oxyCODONE-acetaminophen (PERCOCET) 7.5-325 MG per tablet Take 1 tablet by mouth every 6 (six) hours as needed for moderate pain.     [provider]  OXYGEN Inhale 3 L into the lungs continuous.    [provider]  pantoprazole (PROTONIX) 40 MG tablet Take 1 tablet (40 mg total) by mouth 2 (two) times daily. 05/16/17    Armbruster, Willaim Rayas, MD  predniSONE (DELTASONE) 20 MG tablet Take 15 mg by mouth daily with breakfast.     [provider]  pregabalin (LYRICA) 75 MG capsule Take 75 mg by mouth 2 (two) times daily.    [provider]  sodium chloride (OCEAN) 0.65 % SOLN nasal spray Place 1 spray into both nostrils as needed for congestion.    [provider]  TRELEGY ELLIPTA 100-62.5-25 MCG/INH AEPB Inhale 1 puff into the lungs daily. 12/01/17   [name], MD    Family History Family History  Problem Relation Age of Onset  . Heart disease Mother   . Diverticulitis Mother   . Heart disease Father   . Heart attack Father        x2  . Colon cancer Neg Hx   . Prostate cancer Neg Hx     Social History Social History   Tobacco Use  . Smoking status: Former Smoker    Packs/day: 1.00    Years: 40.00    Pack years: 40.00    Last attempt to quit: 08/26/2005    Years since quitting: 12.3  . Smokeless tobacco: Former Neurosurgeon    Quit date: 04/02/2006  . Tobacco comment: smoked x 40 years, 1 ppd  Substance Use Topics  . Alcohol use: No  . Drug use: No     Allergies   Patient has no known allergies.   Review of Systems Review of Systems  All other systems reviewed and are negative.    Physical Exam Updated Vital Signs BP 126/80 (BP Location: Right Arm)   Pulse 72   Temp 98.4 F (36.9 C) (Oral)   Resp 18   Ht 6\' 2"  (1.88 m)   Wt 94.8 kg (209 lb)   SpO2 100%   BMI 26.83 kg/m   Physical Exam  Constitutional: He is oriented to person, place, and time. He appears well-developed and well-nourished.  HENT:  Head: Normocephalic and atraumatic.  Eyes: EOM are normal.  Neck: Normal range of motion.  Cardiovascular: Normal rate, regular rhythm and normal heart sounds.  Pulmonary/Chest: Effort normal and breath sounds normal. No respiratory distress.  Abdominal: Soft. He exhibits no distension. There is no tenderness.  Musculoskeletal: Normal range of motion.    Neurological: He is alert and oriented to person, place, and time.  Skin: Skin is  warm and dry.  Psychiatric: He has a normal mood and affect. Judgment normal.  Nursing note and vitals reviewed.    ED Treatments / Results  Labs (all labs ordered are listed, but only abnormal results are displayed) Labs Reviewed  CBC - Abnormal; Notable for the following components:      Result Value   RBC 4.02 (*)    Hemoglobin 11.8 (*)    HCT 35.2 (*)    RDW 16.1 (*)    All other components within normal limits  COMPREHENSIVE METABOLIC PANEL - Abnormal; Notable for the following components:   Glucose, Bld 130 (*)    Creatinine, Ser 0.53 (*)    Albumin 3.4 (*)    All other components within normal limits  URINALYSIS, ROUTINE W REFLEX MICROSCOPIC - Abnormal; Notable for the following components:   Specific Gravity, Urine <1.005 (*)    Hgb urine dipstick LARGE (*)    All other components within normal limits  URINALYSIS, MICROSCOPIC (REFLEX) - Abnormal; Notable for the following components:   Bacteria, UA RARE (*)    Squamous Epithelial / LPF 0-5 (*)    All other components within normal limits  URINE CULTURE    EKG EKG Interpretation  Date/Time:  Sunday December 14 2017 03:03:49 EDT Ventricular Rate:  107 PR Interval:    QRS Duration: 96 QT Interval:  379 QTC Calculation: 360 R Axis:   33 Text Interpretation:  Sinus tachycardia Ventricular bigeminy Low voltage, precordial leads Borderline repolarization abnormality bigeminy new since prior tracing Confirmed by Azalia Bilis ([id number]) on 12/14/2017 6:07:23 AM   Radiology No results found.  Procedures Procedures (including critical care time)  Medications Ordered in ED Medications  cefTRIAXone (ROCEPHIN) 1 g in sodium chloride 0.9 % 100 mL IVPB (1 g Intravenous New Bag/Given 12/14/17 0525)     Initial Impression / Assessment and Plan / ED Course  I have reviewed the triage vital signs and the nursing notes.  Pertinent labs &  imaging results that were available during my care of the patient were reviewed by me and considered in my medical decision making (see chart for details).    5:53 AM Patient continues to feel good.  Vital signs are stable.  Urine culture sent.  Given the small amount of bacteria in his urine and new hematuria the patient will be initiated on IV antibiotics for possible developing acute hemorrhagic cystitis.  He has no signs to suggest sepsis at this time.  He has no signs to suggest pyelonephritis at this time.  He is overall very well-appearing.  Urine culture sent.  IV Rocephin now.  Home with Keflex.  Primary care follow-up.  Patient understands return to the ER for new or worsening symptoms  All questions answered.  Prior medical records reviewed  Patient noted to be in bigeminy.  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  He follows with cardiology.  He would intermittently go in and out of bigeminy while in the emergency department.  She does not seem to be symptomatic from this.  He has no lightheadedness and had no syncope or near syncopal episodes today.  Outpatient cardiac follow-up regarding his bigeminy.  This is well-known to the patient.    Final Clinical Impressions(s) / ED Diagnoses   Final diagnoses:  Abdominal pain, unspecified abdominal location  Hematuria, unspecified type  Suprapubic pain  Bacteriuria    ED Discharge Orders        Ordered  cephALEXin (KEFLEX) 500 MG capsule  3 times daily     12/14/17 0550       Azalia Bilis, MD 12/14/17 [id number]    Azalia Bilis, MD 12/14/17 [phone]

## 2017-12-15 ENCOUNTER — Ambulatory Visit: Payer: Medicare HMO | Admitting: Cardiology

## 2017-12-15 ENCOUNTER — Encounter: Payer: Self-pay | Admitting: Cardiology

## 2017-12-15 VITALS — BP 117/67 | HR 46 | Ht 74.0 in | Wt 212.0 lb

## 2017-12-15 DIAGNOSIS — E785 Hyperlipidemia, unspecified: Secondary | ICD-10-CM | POA: Diagnosis not present

## 2017-12-15 DIAGNOSIS — I2510 Atherosclerotic heart disease of native coronary artery without angina pectoris: Secondary | ICD-10-CM

## 2017-12-15 DIAGNOSIS — I493 Ventricular premature depolarization: Secondary | ICD-10-CM

## 2017-12-15 DIAGNOSIS — I10 Essential (primary) hypertension: Secondary | ICD-10-CM | POA: Diagnosis not present

## 2017-12-15 DIAGNOSIS — I4892 Unspecified atrial flutter: Secondary | ICD-10-CM | POA: Diagnosis not present

## 2017-12-15 LAB — URINE CULTURE: CULTURE: NO GROWTH

## 2017-12-15 MED ORDER — FUROSEMIDE 20 MG PO TABS: 60.0000 mg | ORAL_TABLET | Freq: Every day | ORAL | 3 refills | Status: DC

## 2017-12-15 MED ORDER — POTASSIUM CHLORIDE ER 10 MEQ PO TBCR: EXTENDED_RELEASE_TABLET | ORAL | 3 refills | Status: DC

## 2017-12-15 MED ORDER — ATORVASTATIN CALCIUM 80 MG PO TABS: ORAL_TABLET | ORAL | 3 refills | Status: DC

## 2017-12-15 NOTE — Progress Notes (Signed)
HPI The patient presents follow up of CAD with bypass in 2007.   In May of 2017 he was admitted with chest pain and possible pericarditis.   Cardiac cath on 01/23/16 showed occluded saphenous vein graft to diagonal, patent free left radial artery to the obtuse marginal, patent SVG to distal RCA of left Cx, widely patent LIMA to distal LAD, severe native vessel disease with total occlusion of several large diagonals. High-grade obstruction in nondominant RCA and high grade obstruction of proximal and mid LAD with total occlusion distally. Recommendations were for continued medical therapy.  He subsequently was admitted for atrial flutter and later had ablation.  The patient had a presentation for bradycardia.  This was noted in the pain clinic.  He had an EKG which demonstrated   sinus rhythm with ventricular trigeminy with a right bundle branch morphology.  The EKG is repeated in our office and was unchanged.  He has been in the hospital with sepsis since I saw him.  He was in the ED this weekend with UTI.  He was noted to have PVCs with bigeminy.  He was told to come back and see me.  He actually denies any symptoms.  The patient denies any new symptoms such as chest discomfort, neck or arm discomfort. There has been no new shortness of breath, PND or orthopnea. There have been no reported palpitations, presyncope or syncope.  He does not feel the palpitations.  He has had this noted before.     No Known Allergies  Current Outpatient Medications  Medication Sig Dispense Refill  . albuterol (PROAIR HFA) 108 (90 Base) MCG/ACT inhaler Inhale 2 puffs into the lungs every 4 (four) hours as needed for wheezing or shortness of breath. 1 Inhaler 5  . Ascorbic Acid (VITAMIN C) 500 MG tablet Take 500 mg by mouth daily.      Marland Kitchen aspirin EC 81 MG tablet Take 81 mg by mouth daily.    Marland Kitchen atorvastatin (LIPITOR) 80 MG tablet TAKE 1 TABLET (80mg ) BY MOUTH DAILY 90 tablet 3  . B Complex-C (SUPER B COMPLEX PO) Take 1  tablet by mouth daily.     Marland Kitchen Carbonyl Iron (PERFECT IRON PO) Take 1 tablet by mouth every morning.     . cephALEXin (KEFLEX) 500 MG capsule Take 1 capsule (500 mg total) by mouth 3 (three) times daily. 21 capsule 0  . dextromethorphan-guaiFENesin (MUCINEX DM) 30-600 MG 12hr tablet Take 1 tablet by mouth 2 (two) times daily. (Patient taking differently: Take 1 tablet by mouth at bedtime. ) 30 tablet 0  . folic acid (FOLVITE) 800 MCG tablet Take 800 mcg by mouth daily.    . furosemide (LASIX) 20 MG tablet Take 3 tablets (60 mg total) by mouth daily. 270 tablet 3  . ipratropium-albuterol (DUONEB) 0.5-2.5 (3) MG/3ML SOLN Take 3 mLs by nebulization every 4 (four) hours as needed (shortness of breath or wheezing). 360 mL 5  . Loteprednol Etabonate (LOTEMAX) 0.5 % GEL Place 1 drop into both eyes every morning. daily    . Melatonin 1 MG CAPS Take 4 mg by mouth as needed (Sleep).    . Multiple Vitamins-Minerals (VITEYES AREDS ADVANCED PO) Take 1 tablet by mouth daily.    . nitroGLYCERIN (NITROSTAT) 0.4 MG SL tablet Place 1 tablet (0.4 mg total) under the tongue every 5 (five) minutes as needed for chest pain (up to 3 doses for severe chest pain. If no relief after 3rd dose, proceed to the ED  for an evaluation). 25 tablet 2  . oxyCODONE-acetaminophen (PERCOCET) 7.5-325 MG per tablet Take 1 tablet by mouth every 6 (six) hours as needed for moderate pain.     . OXYGEN Inhale 3 L into the lungs continuous.    . pantoprazole (PROTONIX) 40 MG tablet Take 1 tablet (40 mg total) by mouth 2 (two) times daily. 180 tablet 3  . potassium chloride (KLOR-CON 10) 10 MEQ tablet TAKE 4 TABLETS ( ) BY MOUTH EVERY MORNING AND TAKE 2 TABLETS ( ) EVERY EVENING 540 tablet 3  . predniSONE (DELTASONE) 20 MG tablet Take 15 mg by mouth daily with breakfast.     . pregabalin (LYRICA) 75 MG capsule Take 75 mg by mouth 2 (two) times daily.    . sodium chloride (OCEAN) 0.65 % SOLN nasal spray Place 1 spray into both nostrils as  needed for congestion.    . TRELEGY ELLIPTA 100-62.5-25 MCG/INH AEPB Inhale 1 puff into the lungs daily. 28 each 5  . methotrexate 50 MG/2ML injection INJECT 0.4 ML (10 MG TOTAL) UNDER THE SKIN ONCE A WEEK.  4   No current facility-administered medications for this visit.     Past Medical History:  Diagnosis Date  . CAD (coronary artery disease)    MI 2007, CABG  . Cataracts, bilateral   . Complication of anesthesia    DIFFICULTY AFTER CABG HAD CONFUSION POST OP  . COPD (chronic obstructive pulmonary disease) (HCC)   . Diabetes mellitus    Dx 07-2008 A1C 6.2  . Esophageal dysmotility   . Fatty liver 10/11/09  . Gastroesophageal reflux disease   . Gastroparesis   . Hiatal hernia   . Hypertension   . Neuropathy    (related to RA per neuro note 05-2011)  . Onychomycosis   . Osteoporosis    per DEXA 05-2008  . Oxygen dependent    3l  . Peripheral polyneuropathy    Severe, causing problems with his feet, see surgeries  . Psoriasis   . Pulmonary nodules   . Rheumatoid arthritis (HCC)    [facility]  . Septic shock (HCC) 07/2017    Past Surgical History:  Procedure Laterality Date  . A-FLUTTER ABLATION N/A 11/27/2016   Procedure: A-Flutter Ablation;  Surgeon: Marinus Maw, MD;  Location: [facility] INVASIVE CV LAB;  Service: Cardiovascular;  Laterality: N/A;  . achiles tendon surgery  8/09  . CARDIAC CATHETERIZATION N/A 01/23/2016   Procedure: Left Heart Cath and Cors/Grafts Angiography;  Surgeon: Lyn Records, MD;  Location: [facility] INVASIVE CV LAB;  Service: Cardiovascular;  Laterality: N/A;  . CARDIOVERSION N/A 10/23/2016   Procedure: CARDIOVERSION;  Surgeon: Rollene Rotunda, MD;  Location: [facility] ENDOSCOPY;  Service: Cardiovascular;  Laterality: N/A;  . CATARACT EXTRACTION Right 03/2017  . CATARACT EXTRACTION Left 05/2017  . CHOLECYSTECTOMY    . CORONARY ARTERY BYPASS GRAFT  2007   (LIMA to the LAD, left radial to obtuse marginal, SVG to first diagonal, SVG to PDA. His ejection  fraction to 50-55%  . ESOPHAGOGASTRODUODENOSCOPY N/A 10/19/2012   Procedure: ESOPHAGOGASTRODUODENOSCOPY (EGD);  Surgeon: Hart Carwin, MD;  Location: [facility] ENDOSCOPY;  Service: Endoscopy;  Laterality: N/A;  the dilatation is possible.   Marland Kitchen FOOT SURGERY     to tendon release and repair of osteomyelitis   . LARYNGOSCOPY N/A 01/21/2014   Procedure: LARYNGOSCOPY;  Surgeon: Christia Reading, MD;  Location: [facility] OR;  Service: ENT;  Laterality: N/A;  micro direct laryngoscopy with prolaryn injection/jet venturi ventilation  . TOE AMPUTATION  4-12/ 3 14   d/t a deformity, found to have osteomyelitis, complicated by post-op foot strtess FX   ROS      As stated in the HPI and negative for all other systems.  PHYSICAL EXAM BP 117/67   Pulse (!) 46   Ht 6\' 2"  (1.88 m)   Wt 212 lb (96.2 kg)   BMI 27.22 kg/m   GENERAL:  Well appearing NECK:  No jugular venous distention, waveform within normal limits, carotid upstroke brisk and symmetric, no bruits, no thyromegaly LUNGS:  Clear to auscultation bilaterally CHEST:  Well healed sternotomy scar. HEART:  PMI not displaced or sustained,S1 and S2 within normal limits, no S3, no S4, no clicks, no rubs, no murmurs ABD:  Flat, positive bowel sounds normal in frequency in pitch, no bruits, no rebound, no guarding, no midline pulsatile mass, no hepatomegaly, no splenomegaly EXT:  2 plus pulses throughout, no edema, no cyanosis no clubbing, severe arthritic changes  Lab Results  Component Value Date   CHOL 140 07/23/2017   TRIG 113 08/14/2017   HDL 30.00 (L) 07/23/2017   LDLCALC 70 07/23/2017    Lab Results  Component Value Date   HGBA1C 6.1 (H) 08/15/2017    ASSESSMENT AND PLAN  ATRIAL FLUTTER:    He has had no documented recurrence of this.  No further testing is indicated.   CAD:   He has had no new symptoms since his cath above.  No change in therapy.   HTN:    The blood pressure is at target. No change in medications is indicated. We will continue with  therapeutic lifestyle changes (TLC).  DYSLIPIDEMIA:  His LDL was excellent as above.  No change in therapy.   DM:    A1C was 6.5.  PVCs:  He does not notice these.  He has had no presyncope or syncope.  No further work up is indicated.  His pulse is often undercounted because of the ectopy.

## 2017-12-15 NOTE — Patient Instructions (Signed)
Your physician recommends that you schedule a follow-up appointment in: As Needed    

## 2017-12-16 ENCOUNTER — Telehealth: Payer: Self-pay | Admitting: *Deleted

## 2017-12-16 DIAGNOSIS — L97512 Non-pressure chronic ulcer of other part of right foot with fat layer exposed: Secondary | ICD-10-CM | POA: Diagnosis not present

## 2017-12-16 DIAGNOSIS — E13621 Other specified diabetes mellitus with foot ulcer: Secondary | ICD-10-CM | POA: Diagnosis not present

## 2017-12-16 DIAGNOSIS — E1142 Type 2 diabetes mellitus with diabetic polyneuropathy: Secondary | ICD-10-CM | POA: Diagnosis not present

## 2017-12-16 DIAGNOSIS — M0579 Rheumatoid arthritis with rheumatoid factor of multiple sites without organ or systems involvement: Secondary | ICD-10-CM | POA: Diagnosis not present

## 2017-12-16 DIAGNOSIS — M2041 Other hammer toe(s) (acquired), right foot: Secondary | ICD-10-CM | POA: Diagnosis not present

## 2017-12-16 NOTE — Telephone Encounter (Signed)
Received fax from [facility] that patient called on 12/14/17 (office closed) and reports diarrhea since recently restarting Methotrexate. Reported having slight lower abdominal pain. He was advised to see PCP within 24hrs. Chart review shows that pt went to ER on 12/14/17. Also shows that pt recently transferred care to Dr Mauricio Po with [facility] in [place]. If this is confirmed, we will need to update PCP status in Epic.

## 2017-12-16 NOTE — Telephone Encounter (Signed)
Pt and his wife will be moving to the [place] area soon. We are aware that he is establishing with other doctors in that area.

## 2017-12-19 NOTE — Telephone Encounter (Signed)
Spoke w/ Britta Mccreedy, informed her that Dr. Drue Novel will reach out to them about hosp results. She is confused as to why they haven't received call to schedule ED f/u or anything this time. Informed her it does look like they have established with Dr. Mauricio Po at [facility] in [place], she said that was preliminary for when they moved the weekend after Mothers day in May.

## 2017-12-19 NOTE — Telephone Encounter (Signed)
Pt. Is asking if Tiffany or Annice Pih could please call him back.  Wants lab results and has other questions

## 2017-12-19 NOTE — Telephone Encounter (Signed)
Please call today

## 2017-12-19 NOTE — Telephone Encounter (Signed)
I am unable to release results from hospital records. Spoke w/ Dr. Drue Novel- will call Pt and let him know that Dr. Drue Novel will call later today or tomorrow to discuss this.

## 2017-12-22 ENCOUNTER — Emergency Department (HOSPITAL_BASED_OUTPATIENT_CLINIC_OR_DEPARTMENT_OTHER): Payer: Medicare HMO

## 2017-12-22 ENCOUNTER — Inpatient Hospital Stay (HOSPITAL_BASED_OUTPATIENT_CLINIC_OR_DEPARTMENT_OTHER)
Admission: EM | Admit: 2017-12-22 | Discharge: 2017-12-26 | DRG: 193 | Disposition: A | Discharge status: Home Health | Payer: Medicare HMO | Attending: Internal Medicine | Admitting: Internal Medicine

## 2017-12-22 ENCOUNTER — Encounter (HOSPITAL_BASED_OUTPATIENT_CLINIC_OR_DEPARTMENT_OTHER): Payer: Self-pay | Admitting: *Deleted

## 2017-12-22 ENCOUNTER — Other Ambulatory Visit: Payer: Self-pay

## 2017-12-22 DIAGNOSIS — I110 Hypertensive heart disease with heart failure: Secondary | ICD-10-CM | POA: Diagnosis present

## 2017-12-22 DIAGNOSIS — J479 Bronchiectasis, uncomplicated: Secondary | ICD-10-CM | POA: Diagnosis present

## 2017-12-22 DIAGNOSIS — Z8249 Family history of ischemic heart disease and other diseases of the circulatory system: Secondary | ICD-10-CM

## 2017-12-22 DIAGNOSIS — I700 Atherosclerosis of aorta: Secondary | ICD-10-CM | POA: Diagnosis not present

## 2017-12-22 DIAGNOSIS — K760 Fatty (change of) liver, not elsewhere classified: Secondary | ICD-10-CM | POA: Diagnosis present

## 2017-12-22 DIAGNOSIS — Z7952 Long term (current) use of systemic steroids: Secondary | ICD-10-CM

## 2017-12-22 DIAGNOSIS — I5032 Chronic diastolic (congestive) heart failure: Secondary | ICD-10-CM | POA: Diagnosis present

## 2017-12-22 DIAGNOSIS — Z79899 Other long term (current) drug therapy: Secondary | ICD-10-CM | POA: Diagnosis not present

## 2017-12-22 DIAGNOSIS — K219 Gastro-esophageal reflux disease without esophagitis: Secondary | ICD-10-CM | POA: Diagnosis present

## 2017-12-22 DIAGNOSIS — D899 Disorder involving the immune mechanism, unspecified: Secondary | ICD-10-CM | POA: Diagnosis not present

## 2017-12-22 DIAGNOSIS — I483 Typical atrial flutter: Secondary | ICD-10-CM | POA: Diagnosis present

## 2017-12-22 DIAGNOSIS — K3184 Gastroparesis: Secondary | ICD-10-CM | POA: Diagnosis present

## 2017-12-22 DIAGNOSIS — J189 Pneumonia, unspecified organism: Secondary | ICD-10-CM | POA: Diagnosis not present

## 2017-12-22 DIAGNOSIS — D696 Thrombocytopenia, unspecified: Secondary | ICD-10-CM | POA: Diagnosis present

## 2017-12-22 DIAGNOSIS — D638 Anemia in other chronic diseases classified elsewhere: Secondary | ICD-10-CM | POA: Diagnosis present

## 2017-12-22 DIAGNOSIS — D61818 Other pancytopenia: Secondary | ICD-10-CM | POA: Diagnosis present

## 2017-12-22 DIAGNOSIS — I2510 Atherosclerotic heart disease of native coronary artery without angina pectoris: Secondary | ICD-10-CM | POA: Diagnosis present

## 2017-12-22 DIAGNOSIS — A419 Sepsis, unspecified organism: Secondary | ICD-10-CM

## 2017-12-22 DIAGNOSIS — E876 Hypokalemia: Secondary | ICD-10-CM | POA: Diagnosis present

## 2017-12-22 DIAGNOSIS — J181 Lobar pneumonia, unspecified organism: Principal | ICD-10-CM | POA: Diagnosis present

## 2017-12-22 DIAGNOSIS — Z66 Do not resuscitate: Secondary | ICD-10-CM | POA: Diagnosis present

## 2017-12-22 DIAGNOSIS — Z7982 Long term (current) use of aspirin: Secondary | ICD-10-CM

## 2017-12-22 DIAGNOSIS — R509 Fever, unspecified: Secondary | ICD-10-CM | POA: Diagnosis present

## 2017-12-22 DIAGNOSIS — J9611 Chronic respiratory failure with hypoxia: Secondary | ICD-10-CM | POA: Diagnosis not present

## 2017-12-22 DIAGNOSIS — D84821 Immunodeficiency due to drugs: Secondary | ICD-10-CM

## 2017-12-22 DIAGNOSIS — D709 Neutropenia, unspecified: Secondary | ICD-10-CM | POA: Diagnosis not present

## 2017-12-22 DIAGNOSIS — K449 Diaphragmatic hernia without obstruction or gangrene: Secondary | ICD-10-CM | POA: Diagnosis present

## 2017-12-22 DIAGNOSIS — G8929 Other chronic pain: Secondary | ICD-10-CM | POA: Diagnosis present

## 2017-12-22 DIAGNOSIS — M059 Rheumatoid arthritis with rheumatoid factor, unspecified: Secondary | ICD-10-CM | POA: Diagnosis present

## 2017-12-22 DIAGNOSIS — Z9981 Dependence on supplemental oxygen: Secondary | ICD-10-CM

## 2017-12-22 DIAGNOSIS — L409 Psoriasis, unspecified: Secondary | ICD-10-CM | POA: Diagnosis present

## 2017-12-22 DIAGNOSIS — E1140 Type 2 diabetes mellitus with diabetic neuropathy, unspecified: Secondary | ICD-10-CM

## 2017-12-22 DIAGNOSIS — J430 Unilateral pulmonary emphysema [MacLeod's syndrome]: Secondary | ICD-10-CM | POA: Diagnosis present

## 2017-12-22 DIAGNOSIS — M0579 Rheumatoid arthritis with rheumatoid factor of multiple sites without organ or systems involvement: Secondary | ICD-10-CM

## 2017-12-22 DIAGNOSIS — M810 Age-related osteoporosis without current pathological fracture: Secondary | ICD-10-CM | POA: Diagnosis present

## 2017-12-22 DIAGNOSIS — Y95 Nosocomial condition: Secondary | ICD-10-CM | POA: Diagnosis present

## 2017-12-22 DIAGNOSIS — J9621 Acute and chronic respiratory failure with hypoxia: Secondary | ICD-10-CM | POA: Diagnosis present

## 2017-12-22 DIAGNOSIS — I959 Hypotension, unspecified: Secondary | ICD-10-CM | POA: Diagnosis present

## 2017-12-22 DIAGNOSIS — Z9049 Acquired absence of other specified parts of digestive tract: Secondary | ICD-10-CM

## 2017-12-22 DIAGNOSIS — Z87891 Personal history of nicotine dependence: Secondary | ICD-10-CM

## 2017-12-22 DIAGNOSIS — D72819 Decreased white blood cell count, unspecified: Secondary | ICD-10-CM | POA: Diagnosis present

## 2017-12-22 DIAGNOSIS — E1142 Type 2 diabetes mellitus with diabetic polyneuropathy: Secondary | ICD-10-CM | POA: Diagnosis present

## 2017-12-22 DIAGNOSIS — Z7951 Long term (current) use of inhaled steroids: Secondary | ICD-10-CM

## 2017-12-22 DIAGNOSIS — Z89429 Acquired absence of other toe(s), unspecified side: Secondary | ICD-10-CM

## 2017-12-22 DIAGNOSIS — Z951 Presence of aortocoronary bypass graft: Secondary | ICD-10-CM

## 2017-12-22 DIAGNOSIS — I252 Old myocardial infarction: Secondary | ICD-10-CM

## 2017-12-22 LAB — URINALYSIS, ROUTINE W REFLEX MICROSCOPIC
BILIRUBIN URINE: NEGATIVE
Glucose, UA: NEGATIVE mg/dL
KETONES UR: 15 mg/dL — AB
LEUKOCYTES UA: NEGATIVE
NITRITE: NEGATIVE
Protein, ur: NEGATIVE mg/dL
SPECIFIC GRAVITY, URINE: 1.01 (ref 1.005–1.030)
pH: 6.5 (ref 5.0–8.0)

## 2017-12-22 LAB — CBC WITH DIFFERENTIAL/PLATELET
Basophils Absolute: 0 10*3/uL (ref 0.0–0.1)
Basophils Relative: 0 %
EOS PCT: 0 %
Eosinophils Absolute: 0 10*3/uL (ref 0.0–0.7)
HCT: 39.5 % (ref 39.0–52.0)
Hemoglobin: 13.9 g/dL (ref 13.0–17.0)
LYMPHS ABS: 2.9 10*3/uL (ref 0.7–4.0)
Lymphocytes Relative: 64 %
MCH: 30 pg (ref 26.0–34.0)
MCHC: 35.2 g/dL (ref 30.0–36.0)
MCV: 85.1 fL (ref 78.0–100.0)
MONO ABS: 0.7 10*3/uL (ref 0.1–1.0)
Monocytes Relative: 17 %
Neutro Abs: 0.8 10*3/uL — ABNORMAL LOW (ref 1.7–7.7)
Neutrophils Relative %: 19 %
PLATELETS: 147 10*3/uL — AB (ref 150–400)
RBC: 4.64 MIL/uL (ref 4.22–5.81)
RDW: 17.1 % — AB (ref 11.5–15.5)
WBC: 4.4 10*3/uL (ref 4.0–10.5)

## 2017-12-22 LAB — COMPREHENSIVE METABOLIC PANEL
ALK PHOS: 75 U/L (ref 38–126)
ALT: 66 U/L — AB (ref 17–63)
AST: 68 U/L — AB (ref 15–41)
Albumin: 3.4 g/dL — ABNORMAL LOW (ref 3.5–5.0)
Anion gap: 12 (ref 5–15)
BILIRUBIN TOTAL: 1.8 mg/dL — AB (ref 0.3–1.2)
BUN: 16 mg/dL (ref 6–20)
CHLORIDE: 98 mmol/L — AB (ref 101–111)
CO2: 23 mmol/L (ref 22–32)
CREATININE: 0.69 mg/dL (ref 0.61–1.24)
Calcium: 8.6 mg/dL — ABNORMAL LOW (ref 8.9–10.3)
GFR calc Af Amer: >60 mL/min (ref >60)
Glucose, Bld: 125 mg/dL — ABNORMAL HIGH (ref 65–99)
Potassium: 3.8 mmol/L (ref 3.5–5.1)
Sodium: 133 mmol/L — ABNORMAL LOW (ref 135–145)
Total Protein: 6.8 g/dL (ref 6.5–8.1)

## 2017-12-22 LAB — URINALYSIS, MICROSCOPIC (REFLEX)

## 2017-12-22 LAB — GLUCOSE, CAPILLARY: Glucose-Capillary: 124 mg/dL — ABNORMAL HIGH (ref 65–99)

## 2017-12-22 LAB — I-STAT CG4 LACTIC ACID, ED
LACTIC ACID, VENOUS: 1.34 mmol/L (ref 0.5–1.9)
LACTIC ACID, VENOUS: 0.84 mmol/L (ref 0.5–1.9)

## 2017-12-22 MED ORDER — FLUTICASONE FUROATE-VILANTEROL 100-25 MCG/INH IN AEPB
1.0000 | INHALATION_SPRAY | Freq: Every day | RESPIRATORY_TRACT | Status: DC
  Administered 2017-12-23: 1 via RESPIRATORY_TRACT
  Filled 2017-12-22: qty 28

## 2017-12-22 MED ORDER — OXYCODONE-ACETAMINOPHEN 5-325 MG PO TABS
1.0000 | ORAL_TABLET | Freq: Once | ORAL | Status: AC
  Administered 2017-12-22: 1 via ORAL
  Filled 2017-12-22: qty 1

## 2017-12-22 MED ORDER — PREGABALIN 75 MG PO CAPS
75.0000 mg | ORAL_CAPSULE | Freq: Two times a day (BID) | ORAL | Status: DC
  Administered 2017-12-22 – 2017-12-26 (×8): 75 mg via ORAL
  Filled 2017-12-22 (×8): qty 1

## 2017-12-22 MED ORDER — FLUTICASONE-UMECLIDIN-VILANT 100-62.5-25 MCG/INH IN AEPB: 1.0000 | INHALATION_SPRAY | Freq: Every day | RESPIRATORY_TRACT | Status: DC

## 2017-12-22 MED ORDER — ACETAMINOPHEN 325 MG PO TABS
650.0000 mg | ORAL_TABLET | Freq: Once | ORAL | Status: AC
  Administered 2017-12-22: 650 mg via ORAL
  Filled 2017-12-22: qty 2

## 2017-12-22 MED ORDER — PREDNISONE 5 MG PO TABS
15.0000 mg | ORAL_TABLET | Freq: Every day | ORAL | Status: DC
  Administered 2017-12-23 – 2017-12-26 (×4): 15 mg via ORAL
  Filled 2017-12-22 (×4): qty 1

## 2017-12-22 MED ORDER — CEFEPIME HCL 1 G IJ SOLR
INTRAMUSCULAR | Status: AC
  Filled 2017-12-22: qty 1

## 2017-12-22 MED ORDER — SODIUM CHLORIDE 0.9 % IV SOLN
1.0000 g | Freq: Three times a day (TID) | INTRAVENOUS | Status: DC
  Administered 2017-12-22 – 2017-12-26 (×11): 1 g via INTRAVENOUS
  Filled 2017-12-22 (×14): qty 1

## 2017-12-22 MED ORDER — ENOXAPARIN SODIUM 40 MG/0.4ML ~~LOC~~ SOLN
40.0000 mg | SUBCUTANEOUS | Status: DC
  Administered 2017-12-22 – 2017-12-25 (×4): 40 mg via SUBCUTANEOUS
  Filled 2017-12-22 (×4): qty 0.4

## 2017-12-22 MED ORDER — LOTEPREDNOL ETABONATE 0.5 % OP SUSP
1.0000 [drp] | Freq: Every day | OPHTHALMIC | Status: DC
  Administered 2017-12-23 – 2017-12-26 (×4): 1 [drp] via OPHTHALMIC
  Filled 2017-12-22: qty 5

## 2017-12-22 MED ORDER — PANTOPRAZOLE SODIUM 40 MG PO TBEC
40.0000 mg | DELAYED_RELEASE_TABLET | Freq: Two times a day (BID) | ORAL | Status: DC
  Administered 2017-12-22: 40 mg via ORAL
  Filled 2017-12-22: qty 1

## 2017-12-22 MED ORDER — SODIUM CHLORIDE 0.9 % IV BOLUS
1000.0000 mL | Freq: Once | INTRAVENOUS | Status: AC
  Administered 2017-12-22: 1000 mL via INTRAVENOUS

## 2017-12-22 MED ORDER — IOPAMIDOL (ISOVUE-300) INJECTION 61%
100.0000 mL | Freq: Once | INTRAVENOUS | Status: AC | PRN
  Administered 2017-12-22: 80 mL via INTRAVENOUS

## 2017-12-22 MED ORDER — ALBUTEROL SULFATE HFA 108 (90 BASE) MCG/ACT IN AERS: 2.0000 | INHALATION_SPRAY | RESPIRATORY_TRACT | Status: DC | PRN

## 2017-12-22 MED ORDER — VANCOMYCIN HCL IN DEXTROSE 1-5 GM/200ML-% IV SOLN
1000.0000 mg | Freq: Three times a day (TID) | INTRAVENOUS | Status: DC
  Administered 2017-12-22 – 2017-12-23 (×3): 1000 mg via INTRAVENOUS
  Filled 2017-12-22 (×3): qty 200

## 2017-12-22 MED ORDER — ASPIRIN EC 81 MG PO TBEC
81.0000 mg | DELAYED_RELEASE_TABLET | Freq: Every day | ORAL | Status: DC
  Administered 2017-12-22 – 2017-12-26 (×5): 81 mg via ORAL
  Filled 2017-12-22 (×5): qty 1

## 2017-12-22 MED ORDER — PROSIGHT PO TABS
1.0000 | ORAL_TABLET | Freq: Every day | ORAL | Status: DC
  Administered 2017-12-23 – 2017-12-26 (×4): 1 via ORAL
  Filled 2017-12-22 (×4): qty 1

## 2017-12-22 MED ORDER — IPRATROPIUM-ALBUTEROL 0.5-2.5 (3) MG/3ML IN SOLN: 3.0000 mL | RESPIRATORY_TRACT | Status: DC | PRN

## 2017-12-22 MED ORDER — HYDROMORPHONE HCL 1 MG/ML IJ SOLN
0.5000 mg | INTRAMUSCULAR | Status: DC | PRN
  Administered 2017-12-22 – 2017-12-23 (×3): 1 mg via INTRAVENOUS
  Filled 2017-12-22 (×3): qty 1

## 2017-12-22 MED ORDER — INSULIN ASPART 100 UNIT/ML ~~LOC~~ SOLN
0.0000 [IU] | Freq: Three times a day (TID) | SUBCUTANEOUS | Status: DC
  Administered 2017-12-23 (×2): 3 [IU] via SUBCUTANEOUS
  Administered 2017-12-23 – 2017-12-24 (×2): 5 [IU] via SUBCUTANEOUS
  Administered 2017-12-24 – 2017-12-25 (×3): 3 [IU] via SUBCUTANEOUS

## 2017-12-22 MED ORDER — DM-GUAIFENESIN ER 30-600 MG PO TB12
1.0000 | ORAL_TABLET | Freq: Every day | ORAL | Status: DC
  Administered 2017-12-22 – 2017-12-25 (×4): 1 via ORAL
  Filled 2017-12-22 (×4): qty 1

## 2017-12-22 MED ORDER — ATORVASTATIN CALCIUM 40 MG PO TABS
80.0000 mg | ORAL_TABLET | Freq: Every day | ORAL | Status: DC
  Administered 2017-12-22 – 2017-12-25 (×4): 80 mg via ORAL
  Filled 2017-12-22 (×4): qty 2

## 2017-12-22 MED ORDER — VITEYES AREDS ADVANCED PO CAPS: ORAL_CAPSULE | Freq: Every day | ORAL | Status: DC

## 2017-12-22 MED ORDER — UMECLIDINIUM BROMIDE 62.5 MCG/INH IN AEPB
1.0000 | INHALATION_SPRAY | Freq: Every day | RESPIRATORY_TRACT | Status: DC
  Administered 2017-12-23: 1 via RESPIRATORY_TRACT
  Filled 2017-12-22: qty 7

## 2017-12-22 MED ORDER — SODIUM CHLORIDE 0.9 % IV SOLN
1.0000 g | Freq: Once | INTRAVENOUS | Status: AC
  Administered 2017-12-22: 1 g via INTRAVENOUS

## 2017-12-22 MED ORDER — FOLIC ACID 800 MCG PO TABS: 800.0000 ug | ORAL_TABLET | Freq: Every day | ORAL | Status: DC

## 2017-12-22 MED ORDER — LOTEPREDNOL ETABONATE 0.5 % OP GEL: 1.0000 [drp] | Freq: Every morning | OPHTHALMIC | Status: DC

## 2017-12-22 MED ORDER — INSULIN ASPART 100 UNIT/ML ~~LOC~~ SOLN: 0.0000 [IU] | Freq: Every day | SUBCUTANEOUS | Status: DC

## 2017-12-22 MED ORDER — OXYCODONE-ACETAMINOPHEN 5-325 MG PO TABS
2.0000 | ORAL_TABLET | Freq: Once | ORAL | Status: AC
  Administered 2017-12-22: 2 via ORAL
  Filled 2017-12-22: qty 2

## 2017-12-22 MED ORDER — FOLIC ACID 1 MG PO TABS
1.0000 mg | ORAL_TABLET | Freq: Every day | ORAL | Status: DC
  Administered 2017-12-22 – 2017-12-26 (×5): 1 mg via ORAL
  Filled 2017-12-22 (×5): qty 1

## 2017-12-22 NOTE — ED Provider Notes (Addendum)
[facility] EMERGENCY DEPARTMENT Provider Note   CSN: [id number] Arrival date & time: 12/22/17  0935     History   Chief Complaint Chief Complaint  Patient presents with  . Fever    HPI Joseph [name] is a 67 y.o. male.  Joseph [name] is a 67 y.o. Male history of CAD, COPD w/ 3L O2 at home, diabetes, hypertension, rheumatoid arthritis, GERD and peripheral neuropathy, who presents to the ED for evaluation of fever.  Patient and his wife reports that starting yesterday he began having fevers, T-max at home was 102.  Patient was diagnosed with UTI here last week and finish course of Keflex last night, wife reports his urine is dark in color.  Patient denies any abdominal pain, nausea, vomiting or diarrhea.  He denies cough, nasal congestion, sore throat, chest pain or shortness of breath.  He does report that his RA is flaring up and he hurts all over especially in bilateral wrists, but denies any one joint that is more red, warm or swollen.  Patient does not have any known sick contacts.  Patient is on methotrexate for his RA, has had sepsis and neutropenic fever in the past.     Past Medical History:  Diagnosis Date  . CAD (coronary artery disease)    MI 2007, CABG  . Cataracts, bilateral   . Complication of anesthesia    DIFFICULTY AFTER CABG HAD CONFUSION POST OP  . COPD (chronic obstructive pulmonary disease) (HCC)   . Diabetes mellitus    Dx 07-2008 A1C 6.2  . Esophageal dysmotility   . Fatty liver 10/11/09  . Gastroesophageal reflux disease   . Gastroparesis   . Hiatal hernia   . Hypertension   . Neuropathy    (related to RA per neuro note 05-2011)  . Onychomycosis   . Osteoporosis    per DEXA 05-2008  . Oxygen dependent    3l  . Peripheral polyneuropathy    Severe, causing problems with his feet, see surgeries  . Psoriasis   . Pulmonary nodules   . Rheumatoid arthritis (HCC)    [facility]  . Septic shock (HCC) 07/2017    Patient Active  Problem List   Diagnosis Date Noted  . Dyslipidemia 12/15/2017  . Fever 11/09/2017  . Neutropenic fever (HCC) 11/09/2017  . Sepsis (HCC) 09/02/2017  . Septic shock (HCC) 08/14/2017  . PVC (premature ventricular contraction) 07/29/2017  . Atrial flutter (HCC) 10/02/2016  . Hemoptysis 02/23/2016  . Chronic diastolic heart failure (HCC)   . Troponin level elevated   . Chest pain 01/19/2016  . PCP NOTES >>>>>>>>>>>>>>>>>>>>>>>>>>>>>>>>>>>> 05/16/2015  . Chronic obstructive pulmonary emphysema (HCC) 01/13/2015  . Hepatic steatosis 04/17/2014  . Bronchiectasis (HCC) 05/05/2013  . Recurrent pneumonia 11/30/2012  . Pulmonary nodules 11/30/2012  . Gastroparesis 10/20/2012  . Barrett's esophagus 10/19/2012  . Chronic respiratory failure with hypoxia (HCC) 10/17/2012  . Annual physical exam 05/08/2012  . Sicca syndrome (HCC) 10/11/2011  . CAD (coronary artery disease)   . Carotid stenosis 12/10/2010  . Rheumatoid arthritis with rheumatoid factor (HCC) 11/02/2010  . HOARSENESS 10/22/2010  . DYSLIPIDEMIA 09/12/2009  . Type 2 diabetes, controlled, with neuropathy (HCC) 08/01/2008  . Pain in Soft Tissues of Limb 07/13/2008  . Osteoporosis 07/13/2008  . Coronary atherosclerosis 11/25/2006  . 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 Essential hypertension 09/16/2006  . GERD, Barrett's (09-2012 ), gastroparesis 06/06/2006  . PSORIASIS 06/06/2006    Past Surgical History:  Procedure  Laterality Date  . A-FLUTTER ABLATION N/A 11/27/2016   Procedure: A-Flutter Ablation;  Surgeon: Marinus Maw, MD;  Location: [facility] INVASIVE CV LAB;  Service: Cardiovascular;  Laterality: N/A;  . achiles tendon surgery  8/09  . CARDIAC CATHETERIZATION N/A 01/23/2016   Procedure: Left Heart Cath and Cors/Grafts Angiography;  Surgeon: Lyn Records, MD;  Location: [facility] INVASIVE CV LAB;  Service: Cardiovascular;  Laterality: N/A;  . CARDIOVERSION N/A 10/23/2016   Procedure: CARDIOVERSION;  Surgeon:  Rollene Rotunda, MD;  Location: [facility] ENDOSCOPY;  Service: Cardiovascular;  Laterality: N/A;  . CATARACT EXTRACTION Right 03/2017  . CATARACT EXTRACTION Left 05/2017  . CHOLECYSTECTOMY    . CORONARY ARTERY BYPASS GRAFT  2007   (LIMA to the LAD, left radial to obtuse marginal, SVG to first diagonal, SVG to PDA. His ejection fraction to 50-55%  . ESOPHAGOGASTRODUODENOSCOPY N/A 10/19/2012   Procedure: ESOPHAGOGASTRODUODENOSCOPY (EGD);  Surgeon: Hart Carwin, MD;  Location: [facility] ENDOSCOPY;  Service: Endoscopy;  Laterality: N/A;  the dilatation is possible.   Marland Kitchen FOOT SURGERY     to tendon release and repair of osteomyelitis   . LARYNGOSCOPY N/A 01/21/2014   Procedure: LARYNGOSCOPY;  Surgeon: Christia Reading, MD;  Location[facility] OR;  Service: ENT;  Laterality: N/A;  micro direct laryngoscopy with prolaryn injection/jet venturi ventilation  . TOE AMPUTATION  4-12/ 3 14   d/t a deformity, found to have osteomyelitis, complicated by post-op foot strtess FX        Home Medications    Prior to Admission medications   Medication Sig Start Date End Date Taking? Authorizing Provider  albuterol (PROAIR HFA) 108 (90 Base) MCG/ACT inhaler Inhale 2 puffs into the lungs every 4 (four) hours as needed for wheezing or shortness of breath. 12/01/17   [name], MD  Ascorbic Acid (VITAMIN C) 500 MG tablet Take 500 mg by mouth daily.      [provider]  aspirin EC 81 MG tablet Take 81 mg by mouth daily.    [provider]  atorvastatin (LIPITOR) 80 MG tablet TAKE 1 TABLET (80mg ) BY MOUTH DAILY 12/15/17   12/17/17, MD  B Complex-C (SUPER B COMPLEX PO) Take 1 tablet by mouth daily.     [provider]  Carbonyl Iron (PERFECT IRON PO) Take 1 tablet by mouth every morning.     [provider]  cephALEXin (KEFLEX) 500 MG capsule Take 1 capsule (500 mg total) by mouth 3 (three) times daily. 12/14/17   12/16/17, MD  dextromethorphan-guaiFENesin [facility] DM) 30-600 MG 12hr tablet  Take 1 tablet by mouth 2 (two) times daily. Patient taking differently: Take 1 tablet by mouth at bedtime.  08/22/17   08/24/17, MD  folic acid (FOLVITE) 800 MCG tablet Take 800 mcg by mouth daily.    [provider]  furosemide (LASIX) 20 MG tablet Take 3 tablets (60 mg total) by mouth daily. 12/15/17   12/17/17, MD  ipratropium-albuterol (DUONEB) 0.5-2.5 (3) MG/3ML SOLN Take 3 mLs by nebulization every 4 (four) hours as needed (shortness of breath or wheezing). 12/01/17   01/31/18, MD  Loteprednol Etabonate (LOTEMAX) 0.5 % GEL Place 1 drop into both eyes every morning. daily 07/01/14   [provider]  Melatonin 1 MG CAPS Take 4 mg by mouth as needed (Sleep).    [provider]  methotrexate 50 MG/2ML injection INJECT 0.4 ML (10 MG TOTAL) UNDER THE SKIN ONCE A WEEK. 12/01/17   [provider]  Multiple Vitamins-Minerals (VITEYES AREDS ADVANCED PO) Take 1 tablet by mouth daily.    [provider]  nitroGLYCERIN (NITROSTAT) 0.4 MG SL tablet Place 1 tablet (0.4 mg total) under the tongue every 5 (five) minutes as needed for chest pain (up to 3 doses for severe chest pain. If no relief after 3rd dose, proceed to the ED for an evaluation). 10/04/16   Rollene Rotunda, MD  oxyCODONE-acetaminophen (PERCOCET) 7.5-325 MG per tablet Take 1 tablet by mouth every 6 (six) hours as needed for moderate pain.     [provider]  OXYGEN Inhale 3 L into the lungs continuous.    [provider]  pantoprazole (PROTONIX) 40 MG tablet Take 1 tablet (40 mg total) by mouth 2 (two) times daily. 05/16/17   Armbruster, Willaim Rayas, MD  potassium chloride (KLOR-CON 10) 10 MEQ tablet TAKE 4 TABLETS ( ) BY MOUTH EVERY MORNING AND TAKE 2 TABLETS ( ) EVERY EVENING 12/15/17   Rollene Rotunda, MD  predniSONE (DELTASONE) 20 MG tablet Take 15 mg by mouth daily with breakfast.     [provider]  pregabalin (LYRICA) 75 MG capsule Take 75 mg by mouth 2  (two) times daily.    [provider]  sodium chloride (OCEAN) 0.65 % SOLN nasal spray Place 1 spray into both nostrils as needed for congestion.    [provider]  TRELEGY ELLIPTA 100-62.5-25 MCG/INH AEPB Inhale 1 puff into the lungs daily. 12/01/17   [name], MD    Family History Family History  Problem Relation Age of Onset  . Heart disease Mother   . Diverticulitis Mother   . Heart disease Father   . Heart attack Father        x2  . Colon cancer Neg Hx   . Prostate cancer Neg Hx     Social History Social History   Tobacco Use  . Smoking status: Former Smoker    Packs/day: 1.00    Years: 40.00    Pack years: 40.00    Last attempt to quit: 08/26/2005    Years since quitting: 12.3  . Smokeless tobacco: Former Neurosurgeon    Quit date: 04/02/2006  . Tobacco comment: smoked x 40 years, 1 ppd  Substance Use Topics  . Alcohol use: No  . Drug use: No     Allergies   Patient has no known allergies.   Review of Systems Review of Systems  Constitutional: Negative for chills and fever.  HENT: Negative for congestion, sinus pain and sore throat.   Eyes: Negative for visual disturbance.  Respiratory: Positive for cough. Negative for chest tightness, shortness of breath and wheezing.   Gastrointestinal: Negative for abdominal pain, diarrhea, nausea and vomiting.  Genitourinary: Negative for dysuria and frequency.  Musculoskeletal: Positive for arthralgias and joint swelling.  Skin: Negative for color change and rash.  Neurological: Negative for dizziness, syncope and headaches.     Physical Exam Updated Vital Signs BP 122/68 (BP Location: Right Arm)   Pulse 99   Temp (!) 101.9 F (38.8 C) (Oral)   Resp 20   SpO2 99%   Physical Exam  Constitutional: He appears well-developed and well-nourished. No distress.  HENT:  Head: Normocephalic and atraumatic.  Eyes: Right eye exhibits no discharge. Left eye exhibits no discharge.  Cardiovascular: Normal rate,  regular rhythm, normal heart sounds and intact distal pulses.  Pulses:      Radial pulses are 2+ on the right side, and 2+ on the left side.  Dorsalis pedis pulses are 2+ on the right side, and 2+ on the left side.  Pulmonary/Chest: Effort normal and breath sounds normal. No stridor. No respiratory distress. He has no rales.  Respirations equal and unlabored, patient able to speak in full sentences and on home oxygen of 3 L, lungs clear to auscultation bilaterally   Abdominal: Soft. Bowel sounds are normal.  Musculoskeletal: He exhibits no edema or deformity.  All joints are supple and easily movable with some discomfort, no significant swelling, redness or warmth on any one joint to suggest septic arthritis, all compartments soft  Neurological: He is alert. Coordination normal.  Skin: Skin is warm and dry. Capillary refill takes less than 2 seconds. He is not diaphoretic.  Psychiatric: He has a normal mood and affect. His behavior is normal.  Nursing note and vitals reviewed.    ED Treatments / Results  Labs (all labs ordered are listed, but only abnormal results are displayed) Labs Reviewed  COMPREHENSIVE METABOLIC PANEL - Abnormal; Notable for the following components:      Result Value   Sodium 133 (*)    Chloride 98 (*)    Glucose, Bld 125 (*)    Calcium 8.6 (*)    Albumin 3.4 (*)    AST 68 (*)    ALT 66 (*)    Total Bilirubin 1.8 (*)    All other components within normal limits  CBC WITH DIFFERENTIAL/PLATELET - Abnormal; Notable for the following components:   RDW 17.1 (*)    Platelets 147 (*)    Neutro Abs 0.8 (*)    All other components within normal limits  URINALYSIS, ROUTINE W REFLEX MICROSCOPIC - Abnormal; Notable for the following components:   Hgb urine dipstick MODERATE (*)    Ketones, ur 15 (*)    All other components within normal limits  URINALYSIS, MICROSCOPIC (REFLEX) - Abnormal; Notable for the following components:   Bacteria, UA RARE (*)    All  other components within normal limits  CULTURE, BLOOD (ROUTINE X 2)  CULTURE, BLOOD (ROUTINE X 2)  PATHOLOGIST SMEAR REVIEW  I-STAT CG4 LACTIC ACID, ED  I-STAT CG4 LACTIC ACID, ED    EKG EKG Interpretation  Date/Time:  Monday December 22 2017 10:40:12 EDT Ventricular Rate:  95 PR Interval:    QRS Duration: 80 QT Interval:  328 QTC Calculation: 413 R Axis:   23 Text Interpretation:  Sinus rhythm Ventricular premature complex Left atrial enlargement Borderline T wave abnormalities No significant change was found Confirmed by Azalia Bilis ([id number]) on 12/22/2017 4:01:22 PM   Radiology Dg Chest 2 View  Result Date: 12/22/2017 CLINICAL DATA:  Rheumatoid flare up with fever for 2 days. Ex-smoker. EXAM: CHEST - 2 VIEW COMPARISON:  Radiographs 10/27/2017 and 11/08/2017.  CT 08/14/2017. FINDINGS: The heart size and mediastinal contours are stable status post median sternotomy and CABG. There is aortic atherosclerosis. Diffuse central airway thickening is present. There are multiple upper lobe bulla. Dominant left upper lobe lesion demonstrates interval increased wall thickening which may be secondary to superimposed infection or hemorrhage. There is no focal airspace disease, pleural effusion or pneumothorax. No acute osseous findings are evident. IMPRESSION: Interval increased wall thickening of dominant left upper lobe bulla, possibly due to super infection or hemorrhage. Diffuse central airway thickening. No focal airspace disease. Electronically Signed   By: Carey Bullocks M.D.   On: 12/22/2017 10:50   Ct Chest W Contrast  Result Date: 12/22/2017 CLINICAL DATA:  Fever EXAM: CT CHEST WITH  CONTRAST TECHNIQUE: Multidetector CT imaging of the chest was performed during intravenous contrast administration. CONTRAST:  80mL ISOVUE-300 IOPAMIDOL (ISOVUE-300) INJECTION 61% COMPARISON:  Chest radiograph December 22, 2016; chest CT August 14, 2017 FINDINGS: Cardiovascular: There is no thoracic aortic aneurysm  or dissection. There is calcification at the origins of the left common carotid and left subclavian arteries. Mild scattered calcification is noted elsewhere the great vessels. There is aortic atherosclerosis. There are foci of native coronary artery calcification. The patient is status post coronary artery bypass grafting. There is no major vessel pulmonary embolus. No pericardial effusion or pericardial thickening is evident. Mediastinum/Nodes: Thyroid appears normal. There are multiple subcentimeter mediastinal lymph nodes. There is a subcarinal lymph node measuring 1.3 x 1.0 cm. There is a second sub- carinal lymph node measuring 1.2 x 1.0 cm. There is a small hiatal hernia. Lungs/Pleura: There is underlying bullous emphysematous change. Extensive bullous disease is noted in both upper lobes. In comparison with the previous study, there is a bulla in the posterior segment left upper lobe measuring 7.7 x 4.6 cm. The wall of this bulla has become considerably thicker compared to most recent study. There is also thickening along the nearby major fissure with localized consolidation adjacent to the major fissure in the posterior segment of the left upper lobe. There is no other area of consolidation. There are areas of scarring and atelectatic change in the lung bases. There are scattered areas of fibrosis which appear stable. There is a nodular opacity along the right major fissure measuring 9 x 5 mm, stable and likely representing an intrafissural lymph node. Upper Abdomen: Gallbladder is absent. Spleen is incompletely visualized. Spleen measures 13.8 cm from anterior to posterior dimension which is prominent. There is upper abdominal aortic atherosclerosis. Musculoskeletal: No blastic or lytic bone lesions are evident. Patient is status post median sternotomy. No chest wall lesions are evident. IMPRESSION: 1. 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 These changes are  similar compared to prior study. Note, however, that the largest bulla, located in the posterior segment left upper lobe, shows considerable wall thickening, a change from the previous study. There is a nearby area of airspace consolidation in the posterior segment of the left upper lobe adjacent to the major fissure, felt to represent pneumonia. The thickening of the wall of the bulla is most likely due to infection of this bulla. Atypical cystic neoplasm may present in this manner and cannot be entirely excluded at this time. Note that there is no evidence of mycetoma in this bulla or elsewhere. Given this circumstance, would advise repeat CT after treatment for pneumonia to assess for stability or possible resolution of wall thickening in this bulla. Given the possibility of underlying neoplasm, close clinical and imaging surveillance advised in this regard. 2. Mildly prominent sub-carinal lymph nodes of uncertain etiology. These lymph nodes could well be reactive given the parenchymal lung changes. 3. Status post coronary artery bypass grafting. There is aortic and great vessel atherosclerotic calcification as well as calcification in native coronary arteries. 4.  Small hiatal hernia. 5.  Spleen appears enlarged although incompletely visualized. 6.  Gallbladder surgically absent. Aortic Atherosclerosis (ICD10-I70.0) and Emphysema (ICD10-J43.9). Electronically Signed   By: Bretta Bang III M.D.   On: 12/22/2017 13:17    Procedures .Critical Care Performed by: Dartha Lodge, PA-C Authorized by: Dartha Lodge, PA-C   Critical care provider statement:    Critical care time (minutes):  45   Critical care  start time:  12/22/2017 10:00 AM   Critical care end time:  12/22/2017 10:45 AM   Critical care time was exclusive of:  Separately billable procedures and treating other patients   Critical care was necessary to treat or prevent imminent or life-threatening deterioration of the following conditions:   Sepsis and respiratory failure   Critical care was time spent personally by me on the following activities:  Development of treatment plan with patient or surrogate, discussions with consultants, evaluation of patient's response to treatment, examination of patient, obtaining history from patient or surrogate, ordering and performing treatments and interventions, ordering and review of laboratory studies, ordering and review of radiographic studies, re-evaluation of patient's condition, review of old charts and pulse oximetry   I assumed direction of critical care for this patient from another provider in my specialty: no     (including critical care time)  Medications Ordered in ED Medications  ceFEPIme (MAXIPIME) 1 g injection (has no administration in time range)  vancomycin (VANCOCIN) IVPB 1000 mg/200 mL premix (0 mg Intravenous Stopped 12/22/17 1335)  sodium chloride 0.9 % bolus 1,000 mL (0 mLs Intravenous Stopped 12/22/17 1159)  acetaminophen (TYLENOL) tablet 650 mg (650 mg Oral Given 12/22/17 1035)  oxyCODONE-acetaminophen (PERCOCET/ROXICET) 5-325 MG per tablet 1 tablet (1 tablet Oral Given 12/22/17 1036)  ceFEPIme (MAXIPIME) 1 g in sodium chloride 0.9 % 100 mL IVPB (0 g Intravenous Stopped 12/22/17 1316)  iopamidol (ISOVUE-300) 61 % injection 100 mL (80 mLs Intravenous Contrast Given 12/22/17 1247)  sodium chloride 0.9 % bolus 1,000 mL (0 mLs Intravenous Stopped 12/22/17 1430)  sodium chloride 0.9 % bolus 1,000 mL (0 mLs Intravenous Stopped 12/22/17 1542)  oxyCODONE-acetaminophen (PERCOCET/ROXICET) 5-325 MG per tablet 2 tablet (2 tablets Oral Given 12/22/17 1446)     Initial Impression / Assessment and Plan / ED Course  I have reviewed the triage vital signs and the nursing notes.  Pertinent labs & imaging results that were available during my care of the patient were reviewed by me and considered in my medical decision making (see chart for details).  Pt presents to ED for evaluation of  fever, on arrival patient febrile to 101.9, all other vitals normal, patient is on his home oxygen of 3 L [place].  Was recently treated for UTI and completed antibiotics.  He denies any urinary symptoms, he denies shortness of breath, cough aside from his usual smoker's cough, rhinorrhea or nasal congestion.  On exam lungs are clear, abdomen is benign.  Pt reports his RA is flaring up and he is having pain all over primarily in bilateral wrists and hands, no joint concerning for septic arthritis.  Given the patient is immunosuppressed and on methotrexate, and has had fever here we will get basic labs, blood cultures, lactic acid, urinalysis and chest x-ray.  Initial lactic acid is 1.34 which is reassuring.  Patient does not have a leukocytosis, although this is to be expected given he is on methotrexate.  Normal hemoglobin, no acute electrolyte derangements requiring intervention, normal renal function, patient's LFTs are just slightly elevated he is not experiencing any right upper quadrant tenderness.  Urine without any signs of infection.  Blood cultures are pending.  Chest x-ray shows increased wall thickening of the left apical bullae suggestive of infection versus hemorrhage, given patient's febrile will treat as if infectious with pneumonia and start patient empirically on cefepime and vancomycin, since patient is normotensive and normal lactic acid will give 1 L fluid bolus for now, will get  chest CT to better characterize.  Although patient has had reassuring lactic acid he has had several soft blood pressures in the 90s, will give additional 2 L of fluid and get repeat lactic acid.  Patient complaining of pain with his rheumatoid arthritis flare, he also reports some occasional left-sided chest discomfort, I suspect this has to do with pneumonia on chest x-ray, and I have low suspicion for cardiac chest pain.  EKG without any concerning ischemic changes.  Chest CT shows extensive emphysematous bullous  disease, with wall thickening and infiltration surrounding left apical bullae suggestive of pneumonia.  There is also some surrounding lymphadenopathy.  There is also incidental findings of aortic, great vessel and coronary artery atherosclerosis.  Since blood pressure has improved after fluids.  He will need to be admitted for pneumonia, with sepsis with an immunocompromise state.  Will consult [facility] hospitalist for admission.  3:15 PM spoke with Dr. Blake Divine who will admit the patient to stepdown unit, patient will be transferred via [facility] as soon as bed is available.  Patient discussed with Dr. Patria Mane, who saw patient as well and agrees with plan.  Final Clinical Impressions(s) / ED Diagnoses   Final diagnoses:  HCAP (healthcare-associated pneumonia)  Sepsis, due to unspecified organism [facility])  Immunosuppression due to drug therapy    ED Discharge Orders    None       Legrand Rams 12/22/17 2037    Azalia Bilis, MD 12/23/17 0703    Dartha Lodge, PA-C 01/12/18 1326    Azalia Bilis, MD 01/14/18 [phone]

## 2017-12-22 NOTE — Progress Notes (Signed)
Pharmacy Antibiotic Note  Joseph [name] is a 67 y.o. male admitted on 12/22/2017 with pneumonia /sepsis  Plan: Cefepime x 1 per MD Vanc 1 g q8h Monitor renal fx cx vt prn  Height: 6\' 2"  (188 cm) Weight: 210 lb (95.3 kg) IBW/kg (Calculated) : 82.2  Temp (24hrs), Avg:101.6 F (38.7 C), Min:101.2 F (38.4 C), Max:101.9 F (38.8 C)  Recent Labs  Lab 12/22/17 1046 12/22/17 1048  WBC 4.4  --   CREATININE 0.69  --   LATICACIDVEN  --  1.34    Estimated Creatinine Clearance: 107 mL/min (by C-G formula based on SCr of 0.69 mg/dL).    No Known Allergies  12/24/17, PharmD, BCPS, BCCCP Clinical Pharmacist Clinical phone for 12/22/2017 from 7a-3:30p: [phone] If after 3:30p, please call main pharmacy at: [phone] 12/22/2017 12:26 PM

## 2017-12-22 NOTE — ED Notes (Signed)
Attempted to call report to [facility], nurse unavailable; will call back.

## 2017-12-22 NOTE — Progress Notes (Signed)
MD Provider at bedside at this time.

## 2017-12-22 NOTE — Progress Notes (Signed)
67 year old male with copd , hypertension RA on methotrexate presents today with fevers and chills, was found to have pneumonia on CT chest.  On arrival to [facility] he was found tobe febrile, tachycardic and hypotensive. He will be admitted to  Step down under [facility] service for sepsis sec to pneumonia in an immunocompromised patient.    Kathlen Mody, MD [phone]

## 2017-12-22 NOTE — Telephone Encounter (Signed)
I was about to call the patient  but realized he is at the ER.

## 2017-12-22 NOTE — H&P (Signed)
History and Physical    Joseph [name] ZOX:[id number] DOB: Sep 10, 1950 DOA: 12/22/2017  PCP: Lennart Pall, MD  Patient coming from: Home  I have personally briefly reviewed patient's old medical records in [facility]  Chief Complaint: Fever, joint pain  HPI: Joseph [name] is a 67 y.o. male with medical history significant of RA, COPD on 3L O2 at home.  He presents to the ED at [facility] with c/o fever and joint aches.  Feels he is having RA flare up.  No N/V/D.  Denies that any one specific joint is more red, warm, or swollen but does have pain in multiple joints, esp B wrists.  Patient recently restarted on MTX for RA, last dosed on Sat.  Had sepsis, neutropenic fever requiring ICU admit with pressors back in Dec.  ED Course: BPs initially soft, Lactate neg x2.  Imaging appears to show LUL PNA, inflammation surrounding a bullae.  Pt started on cefepime and vanc, given IVF, and transferred for admission.  On arrival, SBP now 150s.   Review of Systems: As per HPI otherwise 10 point review of systems negative.   Past Medical History:  Diagnosis Date  . CAD (coronary artery disease)    MI 2007, CABG  . Cataracts, bilateral   . Complication of anesthesia    DIFFICULTY AFTER CABG HAD CONFUSION POST OP  . COPD (chronic obstructive pulmonary disease) (HCC)   . Diabetes mellitus    Dx 07-2008 A1C 6.2  . Esophageal dysmotility   . Fatty liver 10/11/09  . Gastroesophageal reflux disease   . Gastroparesis   . Hiatal hernia   . Hypertension   . Neuropathy    (related to RA per neuro note 05-2011)  . Onychomycosis   . Osteoporosis    per DEXA 05-2008  . Oxygen dependent    3l  . Peripheral polyneuropathy    Severe, causing problems with his feet, see surgeries  . Psoriasis   . Pulmonary nodules   . Rheumatoid arthritis (HCC)    [facility]  . Septic shock (HCC) 07/2017    Past Surgical History:  Procedure Laterality Date  . A-FLUTTER ABLATION N/A 11/27/2016   Procedure: A-Flutter Ablation;  Surgeon: Marinus Maw, MD;  Location: [facility] INVASIVE CV LAB;  Service: Cardiovascular;  Laterality: N/A;  . achiles tendon surgery  8/09  . CARDIAC CATHETERIZATION N/A 01/23/2016   Procedure: Left Heart Cath and Cors/Grafts Angiography;  Surgeon: Lyn Records, MD;  Location: [facility] INVASIVE CV LAB;  Service: Cardiovascular;  Laterality: N/A;  . CARDIOVERSION N/A 10/23/2016   Procedure: CARDIOVERSION;  Surgeon: Rollene Rotunda, MD;  Location: [facility] ENDOSCOPY;  Service: Cardiovascular;  Laterality: N/A;  . CATARACT EXTRACTION Right 03/2017  . CATARACT EXTRACTION Left 05/2017  . CHOLECYSTECTOMY    . CORONARY ARTERY BYPASS GRAFT  2007   (LIMA to the LAD, left radial to obtuse marginal, SVG to first diagonal, SVG to PDA. His ejection fraction to 50-55%  . ESOPHAGOGASTRODUODENOSCOPY N/A 10/19/2012   Procedure: ESOPHAGOGASTRODUODENOSCOPY (EGD);  Surgeon: Hart Carwin, MD;  Location: [facility] ENDOSCOPY;  Service: Endoscopy;  Laterality: N/A;  the dilatation is possible.   Marland Kitchen FOOT SURGERY     to tendon release and repair of osteomyelitis   . LARYNGOSCOPY N/A 01/21/2014   Procedure: LARYNGOSCOPY;  Surgeon: Christia Reading, MD;  Location: [facility] OR;  Service: ENT;  Laterality: N/A;  micro direct laryngoscopy with prolaryn injection/jet venturi ventilation  . TOE AMPUTATION  4-12/ 3 14   d/t  a deformity, found to have osteomyelitis, complicated by post-op foot strtess FX     reports that he quit smoking about 12 years ago. He has a 40.00 pack-year smoking history. He quit smokeless tobacco use about 11 years ago. He reports that he does not drink alcohol or use drugs.  No Known Allergies  Family History  Problem Relation Age of Onset  . Heart disease Mother   . Diverticulitis Mother   . Heart disease Father   . Heart attack Father        x2  . Colon cancer Neg Hx   . Prostate cancer Neg Hx      Prior to Admission medications   Medication Sig Start Date End Date Taking? Authorizing  Provider  albuterol (PROAIR HFA) 108 (90 Base) MCG/ACT inhaler Inhale 2 puffs into the lungs every 4 (four) hours as needed for wheezing or shortness of breath. 12/01/17  Yes [name], MD  Ascorbic Acid (VITAMIN C) 500 MG tablet Take 500 mg by mouth daily.     Yes [provider]  aspirin EC 81 MG tablet Take 81 mg by mouth daily.   Yes [provider]  atorvastatin (LIPITOR) 80 MG tablet TAKE 1 TABLET (80mg ) BY MOUTH DAILY Patient taking differently: Take 80 mg by mouth at bedtime.  12/15/17  Yes 12/17/17, MD  B Complex-C (SUPER B COMPLEX PO) Take 1 tablet by mouth daily.    Yes [provider]  Carbonyl Iron (PERFECT IRON PO) Take 50 mg by mouth every morning.    Yes [provider]  clobetasol cream (TEMOVATE) 0.05 % Apply 1 application topically 2 (two) times daily as needed.   Yes [provider]  dextromethorphan-guaiFENesin (MUCINEX DM) 30-600 MG 12hr tablet Take 1 tablet by mouth 2 (two) times daily. Patient taking differently: Take 1 tablet by mouth at bedtime.  08/22/17  Yes 08/24/17, MD  folic acid (FOLVITE) 800 MCG tablet Take 800 mcg by mouth daily.   Yes [provider]  furosemide (LASIX) 20 MG tablet Take 3 tablets (60 mg total) by mouth daily. Patient taking differently: Take 60 mg by mouth daily. Take 40 mg in the morning and 20mg  at 3pm 12/15/17  Yes Hochrein, , MD  hydrocortisone cream 1 % Apply 1 application topically 3 (three) times daily as needed for itching.   Yes [provider]  Loteprednol Etabonate (LOTEMAX) 0.5 % GEL Place 1 drop into both eyes every morning. daily 07/01/14  Yes [provider]  Melatonin 1 MG CAPS Take 4 mg by mouth as needed (Sleep).   Yes [provider]  Multiple Vitamins-Minerals (VITEYES AREDS ADVANCED PO) Take 1 tablet by mouth daily.   Yes [provider]  OVER THE COUNTER MEDICATION Apply topically as needed. T-Gel Shampoo   Yes  [provider]  oxyCODONE-acetaminophen (PERCOCET) 7.5-325 MG per tablet Take 1 tablet by mouth every 6 (six) hours as needed for moderate pain.    Yes [provider]  pantoprazole (PROTONIX) 40 MG tablet Take 1 tablet (40 mg total) by mouth 2 (two) times daily. 05/16/17  Yes Armbruster, 12-24-1975, MD  potassium chloride (KLOR-CON 10) 10 MEQ tablet TAKE 4 TABLETS (05/18/17) BY MOUTH EVERY MORNING AND TAKE 2 TABLETS (Willaim Rayas) EVERY EVENING Patient taking differently: TAKE 4 TABLETS ( ) BY MOUTH EVERY MORNING AND TAKE 2 TABLETS ( ) 2pm, take along with furosemide 12/15/17  Yes Hochrein, , MD  predniSONE (DELTASONE) 20 MG tablet Take 15  mg by mouth daily with breakfast.    Yes [provider]  pregabalin (LYRICA) 75 MG capsule Take 75 mg by mouth 2 (two) times daily.   Yes [provider]  TRELEGY ELLIPTA 100-62.5-25 MCG/INH AEPB Inhale 1 puff into the lungs daily. 12/01/17  Yes [name], MD  ipratropium-albuterol (DUONEB) 0.5-2.5 (3) MG/3ML SOLN Take 3 mLs by nebulization every 4 (four) hours as needed (shortness of breath or wheezing). Patient not taking: Reported on 12/22/2017 12/01/17   [name], MD  methotrexate 50 MG/2ML injection INJECT 0.4 ML (10 MG TOTAL) UNDER THE SKIN ONCE A WEEK. 12/01/17   [provider]  nitroGLYCERIN (NITROSTAT) 0.4 MG SL tablet Place 1 tablet (0.4 mg total) under the tongue every 5 (five) minutes as needed for chest pain (up to 3 doses for severe chest pain. If no relief after 3rd dose, proceed to the ED for an evaluation). 10/04/16   Rollene Rotunda, MD  OXYGEN Inhale 3 L into the lungs continuous.    [provider]  sodium chloride (OCEAN) 0.65 % SOLN nasal spray Place 1 spray into both nostrils as needed for congestion.    [provider]    Physical Exam: Vitals:   12/22/17 1800 12/22/17 1830 12/22/17 1900 12/22/17 2015  BP: (!) 107/47 (!) 125/59 (!) 107/43 (!) 151/74  Pulse: 83 82 78 88    Resp: 12 16 15 15   Temp:    98.7 F (37.1 C)  TempSrc:    Oral  SpO2: 97% 97% 100% 97%  Weight:    96.9 kg (213 lb 10 oz)  Height:    6\' 2"  (1.88 m)    Constitutional: NAD, calm, comfortable Eyes: PERRL, lids and conjunctivae normal ENMT: Mucous membranes are moist. Posterior pharynx clear of any exudate or lesions.Normal dentition.  Neck: normal, supple, no masses, no thyromegaly Respiratory: clear to auscultation bilaterally, no wheezing, no crackles. Normal respiratory effort. No accessory muscle use.  Cardiovascular: Regular rate and rhythm, no murmurs / rubs / gallops. No extremity edema. 2+ pedal pulses. No carotid bruits.  Abdomen: no tenderness, no masses palpated. No hepatosplenomegaly. Bowel sounds positive.  Musculoskeletal: no clubbing / cyanosis. No joint deformity upper and lower extremities. Good ROM, no contractures. Normal muscle tone.  Skin: no rashes, lesions, ulcers. No induration Neurologic: CN 2-12 grossly intact. Sensation intact, DTR normal. Strength 5/5 in all 4.  Psychiatric: Normal judgment and insight. Alert and oriented x 3. Normal mood.    Labs on Admission: I have personally reviewed following labs and imaging studies  CBC: Recent Labs  Lab 12/22/17 1046  WBC 4.4  NEUTROABS 0.8*  HGB 13.9  HCT 39.5  MCV 85.1  PLT 147*   Basic Metabolic Panel: Recent Labs  Lab 12/22/17 1046  NA 133*  K 3.8  CL 98*  CO2 23  GLUCOSE 125*  BUN 16  CREATININE 0.69  CALCIUM 8.6*   GFR: Estimated Creatinine Clearance: 107 mL/min (by C-G formula based on SCr of 0.69 mg/dL). Liver Function Tests: Recent Labs  Lab 12/22/17 1046  AST 68*  ALT 66*  ALKPHOS 75  BILITOT 1.8*  PROT 6.8  ALBUMIN 3.4*   No results for input(s): LIPASE, AMYLASE in the last 168 hours. No results for input(s): AMMONIA in the last 168 hours. Coagulation Profile: No results for input(s): INR, PROTIME in the last 168 hours. Cardiac Enzymes: No results for input(s): CKTOTAL,  CKMB, CKMBINDEX, TROPONINI in the last 168 hours. BNP (last 3 results) No  results for input(s): PROBNP in the last 8760 hours. HbA1C: No results for input(s): HGBA1C in the last 72 hours. CBG: No results for input(s): GLUCAP in the last 168 hours. Lipid Profile: No results for input(s): CHOL, HDL, LDLCALC, TRIG, CHOLHDL, LDLDIRECT in the last 72 hours. Thyroid Function Tests: No results for input(s): TSH, T4TOTAL, FREET4, T3FREE, THYROIDAB in the last 72 hours. Anemia Panel: No results for input(s): VITAMINB12, FOLATE, FERRITIN, TIBC, IRON, RETICCTPCT in the last 72 hours. Urine analysis:    Component Value Date/Time   COLORURINE YELLOW 12/22/2017 1337   APPEARANCEUR CLEAR 12/22/2017 1337   LABSPEC 1.010 12/22/2017 1337   PHURINE 6.5 12/22/2017 1337   GLUCOSEU NEGATIVE 12/22/2017 1337   HGBUR MODERATE (A) 12/22/2017 1337   HGBUR negative 04/12/2008 1103   BILIRUBINUR NEGATIVE 12/22/2017 1337   KETONESUR 15 (A) 12/22/2017 1337   PROTEINUR NEGATIVE 12/22/2017 1337   UROBILINOGEN 0.2 10/17/2012 1236   NITRITE NEGATIVE 12/22/2017 1337   LEUKOCYTESUR NEGATIVE 12/22/2017 1337    Radiological Exams on Admission: Dg Chest 2 View  Result Date: 12/22/2017 CLINICAL DATA:  Rheumatoid flare up with fever for 2 days. Ex-smoker. EXAM: CHEST - 2 VIEW COMPARISON:  Radiographs 10/27/2017 and 11/08/2017.  CT 08/14/2017. FINDINGS: The heart size and mediastinal contours are stable status post median sternotomy and CABG. There is aortic atherosclerosis. Diffuse central airway thickening is present. There are multiple upper lobe bulla. Dominant left upper lobe lesion demonstrates interval increased wall thickening which may be secondary to superimposed infection or hemorrhage. There is no focal airspace disease, pleural effusion or pneumothorax. No acute osseous findings are evident. IMPRESSION: Interval increased wall thickening of dominant left upper lobe bulla, possibly due to super infection or  hemorrhage. Diffuse central airway thickening. No focal airspace disease. Electronically Signed   By: Carey Bullocks M.D.   On: 12/22/2017 10:50   Ct Chest W Contrast  Result Date: 12/22/2017 CLINICAL DATA:  Fever EXAM: CT CHEST WITH CONTRAST TECHNIQUE: Multidetector CT imaging of the chest was performed during intravenous contrast administration. CONTRAST:  6mL ISOVUE-300 IOPAMIDOL (ISOVUE-300) INJECTION 61% COMPARISON:  Chest radiograph December 22, 2016; chest CT August 14, 2017 FINDINGS: Cardiovascular: There is no thoracic aortic aneurysm or dissection. There is calcification at the origins of the left common carotid and left subclavian arteries. Mild scattered calcification is noted elsewhere the great vessels. There is aortic atherosclerosis. There are foci of native coronary artery calcification. The patient is status post coronary artery bypass grafting. There is no major vessel pulmonary embolus. No pericardial effusion or pericardial thickening is evident. Mediastinum/Nodes: Thyroid appears normal. There are multiple subcentimeter mediastinal lymph nodes. There is a subcarinal lymph node measuring 1.3 x 1.0 cm. There is a second sub- carinal lymph node measuring 1.2 x 1.0 cm. There is a small hiatal hernia. Lungs/Pleura: There is underlying bullous emphysematous change. Extensive bullous disease is noted in both upper lobes. In comparison with the previous study, there is a bulla in the posterior segment left upper lobe measuring 7.7 x 4.6 cm. The wall of this bulla has become considerably thicker compared to most recent study. There is also thickening along the nearby major fissure with localized consolidation adjacent to the major fissure in the posterior segment of the left upper lobe. There is no other area of consolidation. There are areas of scarring and atelectatic change in the lung bases. There are scattered areas of fibrosis which appear stable. There is a nodular opacity along the right  major fissure measuring 9  x 5 mm, stable and likely representing an intrafissural lymph node. Upper Abdomen: Gallbladder is absent. Spleen is incompletely visualized. Spleen measures 13.8 cm from anterior to posterior dimension which is prominent. There is upper abdominal aortic atherosclerosis. Musculoskeletal: No blastic or lytic bone lesions are evident. Patient is status post median sternotomy. No chest wall lesions are evident. IMPRESSION: 1. 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 These changes are similar compared to prior study. Note, however, that the largest bulla, located in the posterior segment left upper lobe, shows considerable wall thickening, a change from the previous study. There is a nearby area of airspace consolidation in the posterior segment of the left upper lobe adjacent to the major fissure, felt to represent pneumonia. The thickening of the wall of the bulla is most likely due to infection of this bulla. Atypical cystic neoplasm may present in this manner and cannot be entirely excluded at this time. Note that there is no evidence of mycetoma in this bulla or elsewhere. Given this circumstance, would advise repeat CT after treatment for pneumonia to assess for stability or possible resolution of wall thickening in this bulla. Given the possibility of underlying neoplasm, close clinical and imaging surveillance advised in this regard. 2. Mildly prominent sub-carinal lymph nodes of uncertain etiology. These lymph nodes could well be reactive given the parenchymal lung changes. 3. Status post coronary artery bypass grafting. There is aortic and great vessel atherosclerotic calcification as well as calcification in native coronary arteries. 4.  Small hiatal hernia. 5.  Spleen appears enlarged although incompletely visualized. 6.  Gallbladder surgically absent. Aortic Atherosclerosis (ICD10-I70.0) and Emphysema (ICD10-J43.9). Electronically Signed    By: Bretta Bang III M.D.   On: 12/22/2017 13:17    EKG: Independently reviewed.  Assessment/Plan Principal Problem:   Healthcare-associated pneumonia Active Problems:   Chronic respiratory failure with hypoxia (HCC)   Rheumatoid arthritis with rheumatoid factor (HCC)   Chronic diastolic heart failure (HCC)   Fever   Current chronic use of systemic steroids    1. HCAP - 1. PNA pathway 2. Cefepime and vanc 3. Cultures pending 4. Follow up CXR after resolution 2. Chronic steroid use - 1. BPs looking good at 150s 2. HR 80s 3. Dont really have a good reason to move to stress dose steroids at this time and family would prefer not to escalate steroids unless absolutely needed. 4. Therefore will leave him on 15mg  prednisone daily for now. 5. If he develops hypotension, tachycardia, or other signs / symptoms possibly related to adrenal insufficiency, then will go to stress dose steroids. 3. RA - 1. Continue prednisone 1. See above discussion regarding escalation. 2. Just had MTX x2 days ago not due again for another week, but likely will hold in setting of acute illness. 3. Dilaudid PRN pain 4. Fever - due to HCAP vs RA flare 5. Chronic Diastolic CHF - hold lasix for now 6. Chronic O2 dependence - cont home O2  DVT prophylaxis: Lovenox Code Status: Partial code: DNR in case of cardiac arrest, but may be intubated or put on pressors Family Communication: Family at bedside Disposition Plan: Home when better Consults called: None Admission status: Admit to IP   . DO [facility] Pager [phone]  If 7AM-7PM, please contact day team taking care of patient [email or web address] Password [email or web address]  12/22/2017, 10:00 PM

## 2017-12-22 NOTE — ED Notes (Signed)
Patient transported to X-ray 

## 2017-12-22 NOTE — ED Triage Notes (Signed)
Pt assisted from car by this rn and john, emt. Pt reports fevers x 2 days up to 102, pt wife Reports urine is "dark" in color. Pt dx here with uti last week and finished keflex rx last night.

## 2017-12-22 NOTE — ED Notes (Signed)
Report to Okey Regal, RN at [facility] (bed [phone])

## 2017-12-23 DIAGNOSIS — J9611 Chronic respiratory failure with hypoxia: Secondary | ICD-10-CM

## 2017-12-23 DIAGNOSIS — J189 Pneumonia, unspecified organism: Secondary | ICD-10-CM

## 2017-12-23 LAB — CBC
HCT: 38.2 % — ABNORMAL LOW (ref 39.0–52.0)
HEMOGLOBIN: 12.3 g/dL — AB (ref 13.0–17.0)
MCH: 28.4 pg (ref 26.0–34.0)
MCHC: 32.2 g/dL (ref 30.0–36.0)
MCV: 88.2 fL (ref 78.0–100.0)
Platelets: 129 10*3/uL — ABNORMAL LOW (ref 150–400)
RBC: 4.33 MIL/uL (ref 4.22–5.81)
RDW: 16.9 % — ABNORMAL HIGH (ref 11.5–15.5)
WBC: 3.1 10*3/uL — ABNORMAL LOW (ref 4.0–10.5)

## 2017-12-23 LAB — STREP PNEUMONIAE URINARY ANTIGEN: Strep Pneumo Urinary Antigen: NEGATIVE

## 2017-12-23 LAB — BASIC METABOLIC PANEL
ANION GAP: 10 (ref 5–15)
BUN: 11 mg/dL (ref 6–20)
CALCIUM: 8.5 mg/dL — AB (ref 8.9–10.3)
CO2: 20 mmol/L — AB (ref 22–32)
Chloride: 106 mmol/L (ref 101–111)
Creatinine, Ser: 0.6 mg/dL — ABNORMAL LOW (ref 0.61–1.24)
GFR calc Af Amer: >60 mL/min (ref >60)
GFR calc non Af Amer: >60 mL/min (ref >60)
GLUCOSE: 118 mg/dL — AB (ref 65–99)
Potassium: 3.4 mmol/L — ABNORMAL LOW (ref 3.5–5.1)
Sodium: 136 mmol/L (ref 135–145)

## 2017-12-23 LAB — HIV ANTIBODY (ROUTINE TESTING W REFLEX): HIV SCREEN 4TH GENERATION: NONREACTIVE

## 2017-12-23 LAB — GLUCOSE, CAPILLARY
GLUCOSE-CAPILLARY: 158 mg/dL — AB (ref 65–99)
GLUCOSE-CAPILLARY: 217 mg/dL — AB (ref 65–99)
Glucose-Capillary: 149 mg/dL — ABNORMAL HIGH (ref 65–99)

## 2017-12-23 LAB — EXPECTORATED SPUTUM ASSESSMENT W GRAM STAIN, RFLX TO RESP C

## 2017-12-23 LAB — MRSA PCR SCREENING: MRSA BY PCR: NEGATIVE

## 2017-12-23 LAB — EXPECTORATED SPUTUM ASSESSMENT W REFEX TO RESP CULTURE

## 2017-12-23 LAB — PATHOLOGIST SMEAR REVIEW

## 2017-12-23 MED ORDER — POLYETHYLENE GLYCOL 3350 17 G PO PACK
17.0000 g | PACK | Freq: Every day | ORAL | Status: DC
  Administered 2017-12-23: 17 g via ORAL
  Filled 2017-12-23 (×3): qty 1

## 2017-12-23 MED ORDER — ORAL CARE MOUTH RINSE
15.0000 mL | Freq: Two times a day (BID) | OROMUCOSAL | Status: DC
  Administered 2017-12-23 – 2017-12-25 (×6): 15 mL via OROMUCOSAL

## 2017-12-23 MED ORDER — PANTOPRAZOLE SODIUM 40 MG PO TBEC
40.0000 mg | DELAYED_RELEASE_TABLET | Freq: Two times a day (BID) | ORAL | Status: DC
  Administered 2017-12-23 – 2017-12-26 (×7): 40 mg via ORAL
  Filled 2017-12-23 (×7): qty 1

## 2017-12-23 MED ORDER — DOCUSATE SODIUM 100 MG PO CAPS
100.0000 mg | ORAL_CAPSULE | Freq: Two times a day (BID) | ORAL | Status: DC
  Administered 2017-12-23 – 2017-12-25 (×6): 100 mg via ORAL
  Filled 2017-12-23 (×7): qty 1

## 2017-12-23 MED ORDER — OXYCODONE-ACETAMINOPHEN 7.5-325 MG PO TABS
1.0000 | ORAL_TABLET | ORAL | Status: DC | PRN
  Administered 2017-12-23 – 2017-12-25 (×6): 1 via ORAL
  Filled 2017-12-23 (×6): qty 1

## 2017-12-23 MED ORDER — IBUPROFEN 200 MG PO TABS
600.0000 mg | ORAL_TABLET | Freq: Four times a day (QID) | ORAL | Status: DC | PRN
  Administered 2017-12-23: 600 mg via ORAL
  Filled 2017-12-23: qty 3

## 2017-12-23 MED ORDER — HYDROMORPHONE HCL 1 MG/ML IJ SOLN
0.5000 mg | INTRAMUSCULAR | Status: DC | PRN
  Administered 2017-12-23 – 2017-12-24 (×2): 0.5 mg via INTRAVENOUS
  Filled 2017-12-23: qty 0.5
  Filled 2017-12-23: qty 1

## 2017-12-23 NOTE — Progress Notes (Signed)
[facility] Progress Note  Patient: Joseph [name] ZMO:[id number]   PCP: Lennart Pall, MD DOB: [birth year]   DOA: 12/22/2017   DOS: 12/23/2017   Date of Service: the patient was seen and examined on 12/23/2017  Subjective: Feeling better.  No nausea no vomiting no fever no chills.  No cough.  No diarrhea no constipation.  Pain is somewhat better.  Brief hospital course: Pt. with PMH of rheumatoid arthritis, COPD on 3 L of oxygen; admitted on 12/22/2017, presented with complaint of fever and chills, was found to have community-acquired pneumonia. Currently further plan is continue IV antibiotics.  Assessment and Plan: 1.  Community-acquired pneumonia. Immunosuppressed due to being on methotrexate and steroids. Presents with fever and chills.  CT scan of the chest is showing infection of emphysematous bulla. Started on IV antibiotics. Currently getting better. Initially was hypotensive now blood pressure stable, on home oxygen. Strep antigen is negative, also MRSA PCR is negative. Continue cefepime only. Transfer to floor.  2.  Rheumatoid arthritis. On chronic prednisone. Continue current dose. No indication for stress dose steroids. Was on methotrexate for long, after December it was discontinued.  Recently started back on half dose of methotrexate and received 2 doses, last dose was 2 days prior to admission. Initial plan was to add Plaquenil. Would recommend currently hold on the methotrexate and follow-up with rheumatology as an outpatient. Patient had severe pain on admission, currently pain is well controlled.  Continue oral narcotics.  3.  Chronic steroid use. Hypotension. Currently blood pressure is better, no indication for using stress dose steroids.  4.  Coronary artery disease.  S/P CABG last cath 2017 medical management recommended Chronic diastolic CHF. Continue 81 mg aspirin, continue Lipitor. Patient take 60 mg of Lasix at home, currently on hold.  5.   GERD. Continue PPI twice daily.  6.  Hypokalemia. Being replaced.  7.  Chronic pain.  Related to rheumatoid arthritis Continue Lyrica. 75 mg twice daily.  8.  COPD. Chronic respiratory failure on 3 L of oxygen. Currently no evidence of active wheezing. Continue home regimen of inhalers.  9.  History of atrial flutter. Typical. S/P ablation. Follow Up with Dr. Ladona Ridgel as an outpatient.  Diet: Cardiac diet DVT Prophylaxis: subcutaneous Heparin  Advance goals of care discussion: partial code  Family Communication: family was present at bedside, at the time of interview. The pt provided permission to discuss medical plan with the family. Opportunity was given to ask question and all questions were answered satisfactorily.   Disposition:  Discharge to be determined.  Consultants: none, family called PCCM office. Procedures: none  Antibiotics: Anti-infectives (From admission, onward)   Start     Dose/Rate Route Frequency Ordered Stop   12/22/17 2200  ceFEPIme (MAXIPIME) 1 g in sodium chloride 0.9 % 100 mL IVPB     1 g 200 mL/hr over 30 Minutes Intravenous Every 8 hours 12/22/17 2151 12/30/17 2159   12/22/17 1400  vancomycin (VANCOCIN) IVPB 1000 mg/200 mL premix  Status:  Discontinued     1,000 mg 200 mL/hr over 60 Minutes Intravenous Every 8 hours 12/22/17 1226 12/23/17 0750   12/22/17 1225  ceFEPIme (MAXIPIME) 1 g injection    Note to Pharmacy:  Ihor Dow   : cabinet override      12/22/17 1225 12/23/17 0029   12/22/17 1215  ceFEPIme (MAXIPIME) 1 g in sodium chloride 0.9 % 100 mL IVPB     1 g 200 mL/hr over 30 Minutes Intravenous  Once 12/22/17 1208 12/22/17 1316       Objective: Physical Exam: Vitals:   12/23/17 0740 12/23/17 0800 12/23/17 0856 12/23/17 1200  BP:      Pulse: 88     Resp: 13     Temp:  97.9 F (36.6 C)  98.4 F (36.9 C)  TempSrc:  Oral  Axillary  SpO2: 96%  100%   Weight:      Height:        Intake/Output Summary (Last 24 hours) at  12/23/2017 1511 Last data filed at 12/23/2017 0740 Gross per 24 hour  Intake 2050 ml  Output 2100 ml  Net -50 ml   Filed Weights   12/22/17 1030 12/22/17 2015  Weight: 95.3 kg (210 lb) 96.9 kg (213 lb 10 oz)   General: Alert, Awake and Oriented to Time, Place and Person. Appear in mild distress, affect appropriate Eyes: PERRL, Conjunctiva normal ENT: Oral Mucosa clear moist. Neck: no JVD, no Abnormal Mass Or lumps Cardiovascular: S1 and S2 Present, no Murmur, Peripheral Pulses Present Respiratory: normal respiratory effort, Bilateral Air entry equal and Decreased, no use of accessory muscle, basal Crackles, no wheezes Abdomen: Bowel Sound present, Soft and no tenderness, no hernia Skin: no redness, non Rash, no induration Extremities: no Pedal edema, no calf tenderness Neurologic: Grossly no focal neuro deficit. Bilaterally Equal motor strength  Data Reviewed: CBC: Recent Labs  Lab 12/22/17 1046 12/23/17 0329  WBC 4.4 3.1*  NEUTROABS 0.8*  --   HGB 13.9 12.3*  HCT 39.5 38.2*  MCV 85.1 88.2  PLT 147* 129*   Basic Metabolic Panel: Recent Labs  Lab 12/22/17 1046 12/23/17 0329  NA 133* 136  K 3.8 3.4*  CL 98* 106  CO2 23 20*  GLUCOSE 125* 118*  BUN 16 11  CREATININE 0.69 0.60*  CALCIUM 8.6* 8.5*    Liver Function Tests: Recent Labs  Lab 12/22/17 1046  AST 68*  ALT 66*  ALKPHOS 75  BILITOT 1.8*  PROT 6.8  ALBUMIN 3.4*   No results for input(s): LIPASE, AMYLASE in the last 168 hours. No results for input(s): AMMONIA in the last 168 hours. Coagulation Profile: No results for input(s): INR, PROTIME in the last 168 hours. Cardiac Enzymes: No results for input(s): CKTOTAL, CKMB, CKMBINDEX, TROPONINI in the last 168 hours. BNP (last 3 results) No results for input(s): PROBNP in the last 8760 hours. CBG: Recent Labs  Lab 12/22/17 2226 12/23/17 0740  GLUCAP 124* 158*   Studies: No results found.  Scheduled Meds: . aspirin EC  81 mg Oral Daily  .  atorvastatin  80 mg Oral QHS  . dextromethorphan-guaiFENesin  1 tablet Oral QHS  . docusate sodium  100 mg Oral BID  . enoxaparin (LOVENOX) injection  40 mg Subcutaneous Q24H  . fluticasone furoate-vilanterol  1 puff Inhalation Daily  . folic acid  1 mg Oral Daily  . insulin aspart  0-15 Units Subcutaneous TID WC  . insulin aspart  0-5 Units Subcutaneous QHS  . loteprednol  1 drop Both Eyes Daily  . mouth rinse  15 mL Mouth Rinse BID  . multivitamin  1 tablet Oral Daily  . pantoprazole  40 mg Oral BID AC  . polyethylene glycol  17 g Oral Daily  . predniSONE  15 mg Oral Q breakfast  . pregabalin  75 mg Oral BID  . umeclidinium bromide  1 puff Inhalation Daily   Continuous Infusions: . ceFEPime (MAXIPIME) IV 1 g (12/23/17 1311)   PRN  Meds: HYDROmorphone (DILAUDID) injection, ipratropium-albuterol, oxyCODONE-acetaminophen  Time spent: 35 minutes  Author: Lynden Oxford, MD [facility] Pager: [phone] 12/23/2017 3:11 PM  If 7PM-7AM, please contact night-coverage at [email or web address], password [facility]

## 2017-12-23 NOTE — Consult Note (Signed)
Name: Joseph [name] MRN: [id number] DOB: 1951-01-10    ADMISSION DATE:  12/22/2017 CONSULTATION DATE:  4/20  REFERRING MD :  Julian Reil   CHIEF COMPLAINT:  Fever and joint pain; pneumonia and infected bulla  BRIEF PATIENT DESCRIPTION:  This is a 67 year old male patient with a history of chronic respiratory failure in the setting of chronic obstructive pulmonary disease/emphysema, also a former smoker, also has history of rheumatoid arthritis and recurrent aspiration due to chronic aspiration/reflux with resultant bronchiectasis.  He is on 3 L at home 24/7.  He is followed by Dr. Craige Cotta in the pulmonary clinic for his chronic respiratory failure.  His last hospitalization was 3/20 for what was presumed to be rheumatoid flare.  He is followed by rheumatology in the outpatient setting.  Recently started back on methotrexate.  Presented to the emergency room on 4/29 chief complaint of fever bilateral joint pain especially involving the wrists.  Last dose of methotrexate was taken on 4/27.  In the emergency room CT of chest was obtained this demonstrated a new left upper lobe consolidation as well as thickening of an existing pulmonary bulla because of these findings pulmonary was asked to see.  SIGNIFICANT EVENTS  MICRO Respiratory culture 4/30: rare GPC BCX2 4/29 4/29>> ustrep neg    ABX Cefepime 4/29>>> vanc 4/29>>>   STUDIES:  1. 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  Note, however, that the largest bulla, located in the posterior segment left upper lobe, shows considerable wall thickening, a change from the previous study. There is a nearby area of airspace consolidation in the posterior segment of the left upper lobe adjacent to the major fissure, felt to represent pneumonia.2. Mildly prominent sub-carinal lymph nodes of uncertain etiology. These lymph nodes could well be reactive given the parenchymal lung Changes. 3. Status post coronary  artery bypass grafting. There is aortic and great vessel atherosclerotic calcification as well as calcification in native coronary arteries. 4.  Small hiatal hernia.5.  Spleen appears enlarged although incompletely visualized. 6.  Gallbladder surgically absent. Aortic Atherosclerosis (ICD10-I70.0) and Emphysema (ICD10-J43.9).   HISTORY OF PRESENT ILLNESS:  See above   PAST MEDICAL HISTORY :   has a past medical history of CAD (coronary artery disease), Cataracts, bilateral, Complication of anesthesia, COPD (chronic obstructive pulmonary disease) (HCC), Diabetes mellitus, Esophageal dysmotility, Fatty liver (10/11/09), Gastroesophageal reflux disease, Gastroparesis, Hiatal hernia, Hypertension, Neuropathy, Onychomycosis, Osteoporosis, Oxygen dependent, Peripheral polyneuropathy, Psoriasis, Pulmonary nodules, Rheumatoid arthritis (HCC), and Septic shock (HCC) (07/2017).  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 Prior to Admission medications   Medication Sig Start Date End Date Taking? Authorizing Provider  albuterol (PROAIR HFA) 108 (90 Base) MCG/ACT inhaler Inhale 2 puffs into the lungs every 4 (four) hours as needed for wheezing or shortness of breath. 12/01/17  Yes [name], MD  Ascorbic Acid (VITAMIN C) 500 MG tablet Take 500 mg by mouth daily.     Yes [provider]  aspirin EC 81 MG tablet Take 81 mg by mouth daily.   Yes [provider]  atorvastatin (LIPITOR) 80 MG tablet TAKE 1 TABLET (80mg ) BY MOUTH DAILY Patient taking differently: Take 80 mg by mouth at bedtime.  12/15/17  Yes  12/17/17,  MD  B Complex-C (SUPER B COMPLEX PO) Take 1 tablet by mouth daily.    Yes [provider]  Carbonyl Iron (PERFECT IRON PO) Take 50 mg by mouth every morning.    Yes [provider]  clobetasol cream (TEMOVATE) 0.05 % Apply 1 application topically 2 (two) times daily as needed.   Yes [provider]  dextromethorphan-guaiFENesin (MUCINEX DM) 30-600 MG 12hr tablet Take 1 tablet by mouth 2 (two) times daily. Patient taking differently: Take 1 tablet by mouth at bedtime.  08/22/17  Yes Drema Dallas, MD  folic acid (FOLVITE) 800 MCG tablet Take 800 mcg by mouth daily.   Yes [provider]  furosemide (LASIX) 20 MG tablet Take 3 tablets (60 mg total) by mouth daily. Patient taking differently: Take 60 mg by mouth daily. Take 40 mg in the morning and 20mg  at 3pm 12/15/17  Yes Hochrein, Fayrene Fearing, MD  hydrocortisone cream 1 % Apply 1 application topically 3 (three) times daily as needed for itching.   Yes [provider]  Loteprednol Etabonate (LOTEMAX) 0.5 % GEL Place 1 drop into both eyes every morning. daily 07/01/14  Yes [provider]  Melatonin 1 MG CAPS Take 4 mg by mouth as needed (Sleep).   Yes [provider]  Multiple Vitamins-Minerals (VITEYES AREDS ADVANCED PO) Take 1 tablet by mouth daily.   Yes [provider]  OVER THE COUNTER MEDICATION Apply topically as needed. T-Gel Shampoo   Yes [provider]  oxyCODONE-acetaminophen (PERCOCET) 7.5-325 MG per tablet Take 1 tablet by mouth every 6 (six) hours as needed for moderate pain.    Yes [provider]  pantoprazole (PROTONIX) 40 MG tablet Take 1 tablet (40 mg total) by mouth 2 (two) times daily. 05/16/17  Yes Armbruster, Willaim Rayas, MD  potassium chloride (KLOR-CON 10) 10 MEQ tablet TAKE 4 TABLETS ( ) BY MOUTH EVERY MORNING AND TAKE 2 TABLETS ( ) EVERY EVENING Patient taking differently: TAKE 4 TABLETS ( ) BY MOUTH EVERY MORNING  AND TAKE 2 TABLETS ( ) 2pm, take along with furosemide 12/15/17  Yes Hochrein, Fayrene Fearing, MD  predniSONE (DELTASONE) 20 MG tablet Take 15 mg by mouth daily with breakfast.    Yes [provider]  pregabalin (LYRICA) 75 MG capsule Take 75 mg by mouth 2 (two) times daily.   Yes [provider]  TRELEGY ELLIPTA 100-62.5-25 MCG/INH AEPB Inhale 1 puff into the lungs daily. 12/01/17  Yes [name], MD  ipratropium-albuterol (DUONEB) 0.5-2.5 (3) MG/3ML SOLN Take 3 mLs by nebulization every 4 (four) hours as needed (shortness of breath or wheezing). Patient not taking: Reported on 12/22/2017 12/01/17   [name], MD  methotrexate 50 MG/2ML injection INJECT 0.4 ML (10 MG TOTAL) UNDER THE SKIN ONCE A WEEK. 12/01/17   [provider]  nitroGLYCERIN (NITROSTAT) 0.4 MG SL tablet Place 1 tablet (0.4 mg total) under the tongue every 5 (five) minutes as needed for chest pain (up to 3 doses for severe chest pain. If no relief after 3rd dose, proceed to the ED for an evaluation). 10/04/16   Rollene Rotunda, MD  OXYGEN Inhale 3 L into the lungs continuous.    [provider]  sodium chloride (OCEAN) 0.65 % SOLN nasal spray Place 1 spray into both nostrils as needed for congestion.    [provider]   No Known Allergies  FAMILY HISTORY:  family history includes Diverticulitis in his mother; Heart attack in his father; Heart disease in his father and mother.  SOCIAL HISTORY:  reports that he quit smoking about 12 years ago. He has a 40.00 pack-year smoking history. He quit smokeless tobacco use about 11 years ago. He reports that he does not drink alcohol or use drugs.  REVIEW OF SYSTEMS:   Constitutional: No current fevers did have those prior.  Generalized body discomfort better.Marland Kitchen  HENT no nasal congestion no sore throat no visual changes.   Eyes:  Respiratory: Anterior left-sided pleuritic chest pain some shortness of breath cough nonproductive mostly however does bring  green-colored mucus from time to time Cardiovascular: Chest pain is noted above  gastrointestinal: No nausea vomiting or diarrhea  Genitourinary: Voiding.  No urgency or frequency no flank pain.  Musculoskeletal: Joint pain specifically both wrists  skin: No rash or itching Neurological: No tremors no memory loss or communication issues no dizziness  Endo/Heme/Allergies: Negative for environmental allergies and polydipsia. Does not bruise/bleed easily.  SUBJECTIVE:  Feeling better but still has left anterior pleuritic chest pain VITAL SIGNS: Temp:  [97.9 F (36.6 C)-102.3 F (39.1 C)] 97.9 F (36.6 C) (04/30 0800) Pulse Rate:  [47-106] 88 (04/30 0740) Resp:  [10-25] 13 (04/30 0740) BP: (85-174)/(43-77) 110/49 (04/30 0700) SpO2:  [93 %-100 %] 100 % (04/30 0856) Weight:  [213 lb 10 oz (96.9 kg)] 213 lb 10 oz (96.9 kg) (04/29 2015)  PHYSICAL EXAMINATION: General: Pleasant 68 year old white male currently sitting up in bed no acute distress Neuro: Awake oriented no focal deficits HEENT: Normocephalic atraumatic no jugular venous distention Cardiovascular: Regular rate and rhythm with occasional PVC on telemetry Lungs: Clear to auscultation without accessory use Abdomen: Soft nontender no organomegaly Musculoskeletal: Both wrists slightly inflamed but not erythemic.  Equal strength and bulk Skin: Warm and dry scattered areas of ecchymosis  Recent Labs  Lab 12/22/17 1046 12/23/17 0329  NA 133* 136  K 3.8 3.4*  CL 98* 106  CO2 23 20*  BUN 16 11  CREATININE 0.69 0.60*  GLUCOSE 125* 118*   Recent Labs  Lab 12/22/17 1046 12/23/17 0329  HGB 13.9 12.3*  HCT 39.5 38.2*  WBC 4.4 3.1*  PLT 147* 129*   Dg Chest 2 View  Result Date: 12/22/2017 CLINICAL DATA:  Rheumatoid flare up with fever for 2 days. Ex-smoker. EXAM: CHEST - 2 VIEW COMPARISON:  Radiographs 10/27/2017 and 11/08/2017.  CT 08/14/2017. FINDINGS: The heart size and mediastinal contours are stable status post median  sternotomy and CABG. There is aortic atherosclerosis. Diffuse central airway thickening is present. There are multiple upper lobe bulla. Dominant left upper lobe lesion demonstrates interval increased wall thickening which may be secondary to superimposed infection or hemorrhage. There is no focal airspace disease, pleural effusion or pneumothorax. No acute osseous findings are evident. IMPRESSION: Interval increased wall thickening of dominant left upper lobe bulla, possibly due to super infection or hemorrhage. Diffuse central airway thickening. No focal airspace disease. Electronically Signed   By: Carey Bullocks M.D.   On: 12/22/2017 10:50   Ct Chest W Contrast  Result Date: 12/22/2017 CLINICAL DATA:  Fever EXAM: CT CHEST WITH CONTRAST TECHNIQUE: Multidetector CT imaging of the chest was performed during intravenous contrast administration. CONTRAST:  45mL ISOVUE-300 IOPAMIDOL (ISOVUE-300) INJECTION 61% COMPARISON:  Chest radiograph December 22, 2016; chest CT August 14, 2017 FINDINGS: Cardiovascular: There is no thoracic aortic aneurysm or dissection. There is calcification at the origins of the left common carotid and left subclavian arteries. Mild scattered calcification is noted elsewhere the great vessels. There is aortic atherosclerosis. There are foci  of native coronary artery calcification. The patient is status post coronary artery bypass grafting. There is no major vessel pulmonary embolus. No pericardial effusion or pericardial thickening is evident. Mediastinum/Nodes: Thyroid appears normal. There are multiple subcentimeter mediastinal lymph nodes. There is a subcarinal lymph node measuring 1.3 x 1.0 cm. There is a second sub- carinal lymph node measuring 1.2 x 1.0 cm. There is a small hiatal hernia. Lungs/Pleura: There is underlying bullous emphysematous change. Extensive bullous disease is noted in both upper lobes. In comparison with the previous study, there is a bulla in the posterior  segment left upper lobe measuring 7.7 x 4.6 cm. The wall of this bulla has become considerably thicker compared to most recent study. There is also thickening along the nearby major fissure with localized consolidation adjacent to the major fissure in the posterior segment of the left upper lobe. There is no other area of consolidation. There are areas of scarring and atelectatic change in the lung bases. There are scattered areas of fibrosis which appear stable. There is a nodular opacity along the right major fissure measuring 9 x 5 mm, stable and likely representing an intrafissural lymph node. Upper Abdomen: Gallbladder is absent. Spleen is incompletely visualized. Spleen measures 13.8 cm from anterior to posterior dimension which is prominent. There is upper abdominal aortic atherosclerosis. Musculoskeletal: No blastic or lytic bone lesions are evident. Patient is status post median sternotomy. No chest wall lesions are evident. IMPRESSION: 1. 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 These changes are similar compared to prior study. Note, however, that the largest bulla, located in the posterior segment left upper lobe, shows considerable wall thickening, a change from the previous study. There is a nearby area of airspace consolidation in the posterior segment of the left upper lobe adjacent to the major fissure, felt to represent pneumonia. The thickening of the wall of the bulla is most likely due to infection of this bulla. Atypical cystic neoplasm may present in this manner and cannot be entirely excluded at this time. Note that there is no evidence of mycetoma in this bulla or elsewhere. Given this circumstance, would advise repeat CT after treatment for pneumonia to assess for stability or possible resolution of wall thickening in this bulla. Given the possibility of underlying neoplasm, close clinical and imaging surveillance advised in this regard. 2.  Mildly prominent sub-carinal lymph nodes of uncertain etiology. These lymph nodes could well be reactive given the parenchymal lung changes. 3. Status post coronary artery bypass grafting. There is aortic and great vessel atherosclerotic calcification as well as calcification in native coronary arteries. 4.  Small hiatal hernia. 5.  Spleen appears enlarged although incompletely visualized. 6.  Gallbladder surgically absent. Aortic Atherosclerosis (ICD10-I70.0) and Emphysema (ICD10-J43.9). Electronically Signed   By: Bretta Bang III M.D.   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 Also area of new consolidation c/w probable PNA  -ustrep neg -sputum pending Plan Hold MTX Trend PCT  Day #2 vancomycin and cefepime. We will follow-up culture data, hopefully transition to oral therapy in the next 24 to 48 hours.  Given the infected bulla would plan on somewhere between 10 and 14-day course minimally.   We will repeat chest x-ray in a.m. Plan on pulmonary follow-up prior to cessation of antibiotics in the outpatient setting Repeat CT  scan of chest in 8 to 12 weeks to ensure process clearing  Chronic respiratory failure is setting of bullous emphysema  Plan Continue supplemental oxygen Continue bronchodilators  SIRS/sepsis Plan Continue IV hydration Continue bronchodilators  RA on MTX Plan Holding methotrexate Continue prednisone  Anemia of chronic disease Plan Trend CBC Transfuse per protocol  Mild thrombocytopenia Plan Trend CBC Simonne Martinet ACNP-BC [facility]Critical [facility] Pager # [phone] OR # [phone] if no answer     12/23/2017, 1:23 PM

## 2017-12-24 ENCOUNTER — Telehealth: Payer: Self-pay | Admitting: Pulmonary Disease

## 2017-12-24 ENCOUNTER — Inpatient Hospital Stay (HOSPITAL_COMMUNITY): Payer: Medicare HMO

## 2017-12-24 DIAGNOSIS — D61818 Other pancytopenia: Secondary | ICD-10-CM

## 2017-12-24 DIAGNOSIS — D72819 Decreased white blood cell count, unspecified: Secondary | ICD-10-CM | POA: Diagnosis present

## 2017-12-24 DIAGNOSIS — E876 Hypokalemia: Secondary | ICD-10-CM | POA: Diagnosis present

## 2017-12-24 DIAGNOSIS — D696 Thrombocytopenia, unspecified: Secondary | ICD-10-CM | POA: Diagnosis present

## 2017-12-24 LAB — CBC WITH DIFFERENTIAL/PLATELET
Basophils Absolute: 0 10*3/uL (ref 0.0–0.1)
Basophils Relative: 0 %
Eosinophils Absolute: 0 10*3/uL (ref 0.0–0.7)
Eosinophils Relative: 2 %
HEMATOCRIT: 32.8 % — AB (ref 39.0–52.0)
HEMOGLOBIN: 11 g/dL — AB (ref 13.0–17.0)
Lymphocytes Relative: 44 %
Lymphs Abs: 0.8 10*3/uL (ref 0.7–4.0)
MCH: 28.9 pg (ref 26.0–34.0)
MCHC: 33.5 g/dL (ref 30.0–36.0)
MCV: 86.1 fL (ref 78.0–100.0)
MONO ABS: 0.4 10*3/uL (ref 0.1–1.0)
MONOS PCT: 25 %
NEUTROS ABS: 0.5 10*3/uL — AB (ref 1.7–7.7)
Neutrophils Relative %: 29 %
Platelets: 113 10*3/uL — ABNORMAL LOW (ref 150–400)
RBC: 3.81 MIL/uL — ABNORMAL LOW (ref 4.22–5.81)
RDW: 16.1 % — ABNORMAL HIGH (ref 11.5–15.5)
WBC: 1.7 10*3/uL — ABNORMAL LOW (ref 4.0–10.5)

## 2017-12-24 LAB — PATHOLOGIST SMEAR REVIEW

## 2017-12-24 LAB — BASIC METABOLIC PANEL
Anion gap: 9 (ref 5–15)
BUN: 13 mg/dL (ref 6–20)
CO2: 24 mmol/L (ref 22–32)
CREATININE: 0.51 mg/dL — AB (ref 0.61–1.24)
Calcium: 8.3 mg/dL — ABNORMAL LOW (ref 8.9–10.3)
Chloride: 104 mmol/L (ref 101–111)
GFR calc Af Amer: >60 mL/min (ref >60)
GFR calc non Af Amer: >60 mL/min (ref >60)
Glucose, Bld: 121 mg/dL — ABNORMAL HIGH (ref 65–99)
Potassium: 3.4 mmol/L — ABNORMAL LOW (ref 3.5–5.1)
Sodium: 137 mmol/L (ref 135–145)

## 2017-12-24 LAB — CBC
HCT: 33.7 % — ABNORMAL LOW (ref 39.0–52.0)
Hemoglobin: 11 g/dL — ABNORMAL LOW (ref 13.0–17.0)
MCH: 28.4 pg (ref 26.0–34.0)
MCHC: 32.6 g/dL (ref 30.0–36.0)
MCV: 86.9 fL (ref 78.0–100.0)
PLATELETS: 122 10*3/uL — AB (ref 150–400)
RBC: 3.88 MIL/uL — ABNORMAL LOW (ref 4.22–5.81)
RDW: 16.4 % — AB (ref 11.5–15.5)
WBC: 2.6 10*3/uL — ABNORMAL LOW (ref 4.0–10.5)

## 2017-12-24 LAB — GLUCOSE, CAPILLARY
GLUCOSE-CAPILLARY: 137 mg/dL — AB (ref 65–99)
Glucose-Capillary: 189 mg/dL — ABNORMAL HIGH (ref 65–99)
Glucose-Capillary: 121 mg/dL — ABNORMAL HIGH (ref 65–99)
Glucose-Capillary: 172 mg/dL — ABNORMAL HIGH (ref 65–99)
Glucose-Capillary: 210 mg/dL — ABNORMAL HIGH (ref 65–99)

## 2017-12-24 MED ORDER — DIPHENHYDRAMINE HCL 25 MG PO CAPS
25.0000 mg | ORAL_CAPSULE | Freq: Four times a day (QID) | ORAL | Status: DC | PRN
  Administered 2017-12-24 – 2017-12-26 (×3): 25 mg via ORAL
  Filled 2017-12-24 (×3): qty 1

## 2017-12-24 MED ORDER — IBUPROFEN 800 MG PO TABS
800.0000 mg | ORAL_TABLET | Freq: Three times a day (TID) | ORAL | Status: DC
  Administered 2017-12-24 – 2017-12-26 (×7): 800 mg via ORAL
  Filled 2017-12-24 (×7): qty 1

## 2017-12-24 MED ORDER — MUSCLE RUB 10-15 % EX CREA
TOPICAL_CREAM | CUTANEOUS | Status: DC | PRN
  Filled 2017-12-24: qty 85

## 2017-12-24 NOTE — Progress Notes (Signed)
67 year old ex-smoker with severe bullous emphysema and rheumatoid arthritis and chronic respiratory failure maintained on 3 L of oxygen at home. He has recently restarted methotrexate per rheumatology and follows up with the rheumatologist at [facility]. He also has bronchiectasis related to chronic aspiration  He feels improved, afebrile, dry cough. Exam decreased breath sounds bilateral, stigmata and deformities of arthritis, no edema.  Labs show pancytopenia and mild hypokalemia. Impression/plan Infected bulla-CT scan shows that left upper lobe bulla is now thick-walled with adjacent consolidation, sputum and blood cultures are negative, can switch to oral antibiotics but would use a longer course up to 14 days and then reimage with chest x-ray.  He will need an office visit in 2 weeks  Eventually he needs repeat CT scan in 3 months  COPD-continue his home meds on discharge, he is chronically on 3 L oxygen He is planning to move to [facility] area and is established with PCP there.  Pancytopenia-seems to be related to methotrexate, which has been stopped  PCCM available as needed  Baelynn Schmuhl V. Vassie Loll MD

## 2017-12-24 NOTE — Progress Notes (Signed)
Patient ID: Joseph [name], male   DOB: 10-29-1950, 67 y.o.   MRN: [id number]  PROGRESS NOTE    Joseph [name]  MMN:[id number] DOB: 1951-03-15 DOA: 12/22/2017 PCP: Lennart Pall, MD   Outpatient Specialists: Dr. Olean Ree at [facility]  Brief Narrative:  67 year old gentleman admitted with acute on chronic respiratory failure secondary to severe bullous emphysema with infection. Patient also has bronchiectasis and chronic aspiration pneumonia.  He is chronically on oxygen at home at 3 L.  Patient was initially in the ICU under the care of CCM but now transferred to the flor. He has severe rheumatoid arthritis followed at [facility].  Currently started on methotrexate recently.  Patient has pancytopenia and therefore immunocompromised   Assessment & Plan:   Principal Problem:   Healthcare-associated pneumonia Active Problems:   Type 2 diabetes, controlled, with neuropathy (HCC)   CAD (coronary artery disease)   Chronic respiratory failure with hypoxia (HCC)   Rheumatoid arthritis with rheumatoid factor (HCC)   Chronic diastolic heart failure (HCC)   Fever   Current chronic use of systemic steroids   Hypokalemia   Thrombocytopenia (HCC)   Leucopenia   #1 healthcare associated pneumonia: patient currently on antibiotics.  He is feeling better but weak still.  On 2 L of oxygen at the moment.  Continue treatment on mobilize patient. He has infected bulla recommendation is 2 weeks of oral antibiotics at discharge.  #2 COPD: Acute exacerbation.  Continue oxygen with nebulizer and steroids  #3 pancytopenia: Monitor his white count is spatially neutrophils.  Most likely due to methotrexate.  Methotrexate has been stopped in the hospital.  #4 generalized debility: PT and OT has been consulted.  #5 diabetes: continue monitoring blood sugar as well as sliding scale insulin.  #6 hypokalemia: Replete potassium   DVT prophylaxis: SCD Code Status: Full Family Communication: Wife  at bedside Disposition Plan: home with wife.  Patient planning to move to [facility]  Consultants:   CCM, Dr Vassie Loll  Procedures:   None   Antimicrobials:   Cefepime 4/29>>>>    Subjective: Patient is breathing better and still weak and tired.  Denies any fever or chills denied any nausea vomiting or diarrhea.  He is worried about his white count  Objective: Vitals:   12/23/17 1400 12/23/17 1500 12/23/17 2058 12/24/17 0644  BP: (!) 150/83 (!) 123/33 118/74 (!) 111/56  Pulse: (!) 43 (!) 51 (!) 46 83  Resp: 18 13 18 17   Temp:   97.9 F (36.6 C) 98.8 F (37.1 C)  TempSrc:   Oral Oral  SpO2: 95% 97% (!) 65% 100%  Weight:      Height:        Intake/Output Summary (Last 24 hours) at 12/24/2017 0936 Last data filed at 12/24/2017 9038 Gross per 24 hour  Intake 980 ml  Output 1701 ml  Net -721 ml   Filed Weights   12/22/17 1030 12/22/17 2015  Weight: 95.3 kg (210 lb) 96.9 kg (213 lb 10 oz)    Examination:  General exam: Appears calm and comfortable  Respiratory system: Clear to auscultation. Respiratory effort normal. Cardiovascular system: S1 & S2 heard, RRR. No JVD, murmurs, rubs, gallops or clicks. No pedal edema. Gastrointestinal system: Abdomen is nondistended, soft and nontender. No organomegaly or masses felt. Normal bowel sounds heard. Central nervous system: Alert and oriented. No focal neurological deficits. Extremities: Symmetric 5 x 5 power. Skin: No rashes, lesions or ulcers Psychiatry: Judgement and insight appear normal. Mood &  affect appropriate.     Data Reviewed: I have personally reviewed following labs and imaging studies  CBC: Recent Labs  Lab 12/22/17 1046 12/23/17 0329  WBC 4.4 3.1*  NEUTROABS 0.8*  --   HGB 13.9 12.3*  HCT 39.5 38.2*  MCV 85.1 88.2  PLT 147* 129*   Basic Metabolic Panel: Recent Labs  Lab 12/22/17 1046 12/23/17 0329 12/24/17 0613  NA 133* 136 137  K 3.8 3.4* 3.4*  CL 98* 106 104  CO2 23 20* 24  GLUCOSE 125*  118* 121*  BUN 16 11 13   CREATININE 0.69 0.60* 0.51*  CALCIUM 8.6* 8.5* 8.3*   GFR: Estimated Creatinine Clearance: 107 mL/min (A) (by C-G formula based on SCr of 0.51 mg/dL (L)). Liver Function Tests: Recent Labs  Lab 12/22/17 1046  AST 68*  ALT 66*  ALKPHOS 75  BILITOT 1.8*  PROT 6.8  ALBUMIN 3.4*   No results for input(s): LIPASE, AMYLASE in the last 168 hours. No results for input(s): AMMONIA in the last 168 hours. Coagulation Profile: No results for input(s): INR, PROTIME in the last 168 hours. Cardiac Enzymes: No results for input(s): CKTOTAL, CKMB, CKMBINDEX, TROPONINI in the last 168 hours. BNP (last 3 results) No results for input(s): PROBNP in the last 8760 hours. HbA1C: No results for input(s): HGBA1C in the last 72 hours. CBG: Recent Labs  Lab 12/22/17 2226 12/23/17 0740 12/23/17 1224 12/23/17 2226 12/24/17 0742  GLUCAP 124* 158* 217* 149* 121*   Lipid Profile: No results for input(s): CHOL, HDL, LDLCALC, TRIG, CHOLHDL, LDLDIRECT in the last 72 hours. Thyroid Function Tests: No results for input(s): TSH, T4TOTAL, FREET4, T3FREE, THYROIDAB in the last 72 hours. Anemia Panel: No results for input(s): VITAMINB12, FOLATE, FERRITIN, TIBC, IRON, RETICCTPCT in the last 72 hours. Urine analysis:    Component Value Date/Time   COLORURINE YELLOW 12/22/2017 1337   APPEARANCEUR CLEAR 12/22/2017 1337   LABSPEC 1.010 12/22/2017 1337   PHURINE 6.5 12/22/2017 1337   GLUCOSEU NEGATIVE 12/22/2017 1337   HGBUR MODERATE (A) 12/22/2017 1337   HGBUR negative 04/12/2008 1103   BILIRUBINUR NEGATIVE 12/22/2017 1337   KETONESUR 15 (A) 12/22/2017 1337   PROTEINUR NEGATIVE 12/22/2017 1337   UROBILINOGEN 0.2 10/17/2012 1236   NITRITE NEGATIVE 12/22/2017 1337   LEUKOCYTESUR NEGATIVE 12/22/2017 1337   Sepsis Labs: @LABRCNTIP (procalcitonin:4,lacticidven:4)  ) Recent Results (from the past 240 hour(s))  Blood Culture (routine x 2)     Status: None (Preliminary result)    Collection Time: 12/22/17 10:46 AM  Result Value Ref Range Status   Specimen Description   Final    BLOOD LEFT ARM Performed at [facility], [address] [place], [place] [address]    Special Requests   Final    BOTTLES DRAWN AEROBIC AND ANAEROBIC Blood Culture adequate volume Performed at [facility], [address] [place], [place] [address]    Culture   Final    NO GROWTH < 24 HOURS Performed at [facility], [address] [place], [place] [address]    Report Status PENDING  Incomplete  Blood Culture (routine x 2)     Status: None (Preliminary result)   Collection Time: 12/22/17 10:55 AM  Result Value Ref Range Status   Specimen Description   Final    BLOOD LEFT HAND Performed at [facility], [address], [place], [place] [address]    Special Requests   Final    BOTTLES DRAWN  AEROBIC AND ANAEROBIC Blood Culture adequate volume Performed at [facility], [address] [place], [place] [address]    Culture   Final    NO GROWTH < 24 HOURS Performed at [facility], [address], [place], [place] [address]    Report Status PENDING  Incomplete  MRSA PCR Screening     Status: None   Collection Time: 12/22/17  8:41 PM  Result Value Ref Range Status   MRSA by PCR NEGATIVE NEGATIVE Final    Comment:        The GeneXpert MRSA Assay (FDA approved for NASAL specimens only), is one component of a comprehensive MRSA colonization surveillance program. It is not intended to diagnose MRSA infection nor to guide or monitor treatment for MRSA infections. Performed at [facility], [address] [place], [place] [address]   Culture, sputum-assessment     Status: None   Collection Time: 12/23/17  1:10 AM  Result Value Ref Range Status   Specimen Description SPUTUM  Final   Special Requests NONE  Final   Sputum evaluation   Final    THIS SPECIMEN IS ACCEPTABLE FOR SPUTUM CULTURE Performed  at [facility], [address], [name], [place] [address]    Report Status 12/23/2017 FINAL  Final  Culture, respiratory (NON-Expectorated)     Status: None (Preliminary result)   Collection Time: 12/23/17  1:10 AM  Result Value Ref Range Status   Specimen Description   Final    SPUTUM Performed at [facility], [address], [place], [place] [address]    Special Requests   Final    NONE Reflexed from [id number] Performed at [facility], [address], [place], [place] [address]    Gram Stain   Final    FEW WBC PRESENT, PREDOMINANTLY PMN RARE GRAM POSITIVE COCCI Performed at [facility], [address] [place], [place] [address]    Culture PENDING  Incomplete   Report Status PENDING  Incomplete         Radiology Studies: Dg Chest 2 View  Result Date: 12/22/2017 CLINICAL DATA:  Rheumatoid flare up with fever for 2 days. Ex-smoker. EXAM: CHEST - 2 VIEW COMPARISON:  Radiographs 10/27/2017 and 11/08/2017.  CT 08/14/2017. FINDINGS: The heart size and mediastinal contours are stable status post median sternotomy and CABG. There is aortic atherosclerosis. Diffuse central airway thickening is present. There are multiple upper lobe bulla. Dominant left upper lobe lesion demonstrates interval increased wall thickening which may be secondary to superimposed infection or hemorrhage. There is no focal airspace disease, pleural effusion or pneumothorax. No acute osseous findings are evident. IMPRESSION: Interval increased wall thickening of dominant left upper lobe bulla, possibly due to super infection or hemorrhage. Diffuse central airway thickening. No focal airspace disease. Electronically Signed   By: Carey Bullocks M.D.   On: 12/22/2017 10:50   Ct Chest W Contrast  Result Date: 12/22/2017 CLINICAL DATA:  Fever EXAM: CT CHEST WITH CONTRAST TECHNIQUE: Multidetector CT imaging of the chest was performed during intravenous  contrast administration. CONTRAST:  12mL ISOVUE-300 IOPAMIDOL (ISOVUE-300) INJECTION 61% COMPARISON:  Chest radiograph December 22, 2016; chest CT August 14, 2017 FINDINGS: Cardiovascular: There is no thoracic aortic aneurysm or dissection. There is calcification at the origins of the left common carotid and left subclavian arteries. Mild scattered calcification is noted elsewhere the great vessels. There is aortic atherosclerosis. There are foci of native coronary  artery calcification. The patient is status post coronary artery bypass grafting. There is no major vessel pulmonary embolus. No pericardial effusion or pericardial thickening is evident. Mediastinum/Nodes: Thyroid appears normal. There are multiple subcentimeter mediastinal lymph nodes. There is a subcarinal lymph node measuring 1.3 x 1.0 cm. There is a second sub- carinal lymph node measuring 1.2 x 1.0 cm. There is a small hiatal hernia. Lungs/Pleura: There is underlying bullous emphysematous change. Extensive bullous disease is noted in both upper lobes. In comparison with the previous study, there is a bulla in the posterior segment left upper lobe measuring 7.7 x 4.6 cm. The wall of this bulla has become considerably thicker compared to most recent study. There is also thickening along the nearby major fissure with localized consolidation adjacent to the major fissure in the posterior segment of the left upper lobe. There is no other area of consolidation. There are areas of scarring and atelectatic change in the lung bases. There are scattered areas of fibrosis which appear stable. There is a nodular opacity along the right major fissure measuring 9 x 5 mm, stable and likely representing an intrafissural lymph node. Upper Abdomen: Gallbladder is absent. Spleen is incompletely visualized. Spleen measures 13.8 cm from anterior to posterior dimension which is prominent. There is upper abdominal aortic atherosclerosis. Musculoskeletal: No blastic or  lytic bone lesions are evident. Patient is status post median sternotomy. No chest wall lesions are evident. IMPRESSION: 1. 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 These changes are similar compared to prior study. Note, however, that the largest bulla, located in the posterior segment left upper lobe, shows considerable wall thickening, a change from the previous study. There is a nearby area of airspace consolidation in the posterior segment of the left upper lobe adjacent to the major fissure, felt to represent pneumonia. The thickening of the wall of the bulla is most likely due to infection of this bulla. Atypical cystic neoplasm may present in this manner and cannot be entirely excluded at this time. Note that there is no evidence of mycetoma in this bulla or elsewhere. Given this circumstance, would advise repeat CT after treatment for pneumonia to assess for stability or possible resolution of wall thickening in this bulla. Given the possibility of underlying neoplasm, close clinical and imaging surveillance advised in this regard. 2. Mildly prominent sub-carinal lymph nodes of uncertain etiology. These lymph nodes could well be reactive given the parenchymal lung changes. 3. Status post coronary artery bypass grafting. There is aortic and great vessel atherosclerotic calcification as well as calcification in native coronary arteries. 4.  Small hiatal hernia. 5.  Spleen appears enlarged although incompletely visualized. 6.  Gallbladder surgically absent. Aortic Atherosclerosis (ICD10-I70.0) and Emphysema (ICD10-J43.9). Electronically Signed   By: Bretta Bang III M.D.   On: 12/22/2017 13:17        Scheduled Meds: . aspirin EC  81 mg Oral Daily  . atorvastatin  80 mg Oral QHS  . dextromethorphan-guaiFENesin  1 tablet Oral QHS  . docusate sodium  100 mg Oral BID  . enoxaparin (LOVENOX) injection  40 mg Subcutaneous Q24H  . fluticasone  furoate-vilanterol  1 puff Inhalation Daily  . folic acid  1 mg Oral Daily  . insulin aspart  0-15 Units Subcutaneous TID WC  . insulin aspart  0-5 Units Subcutaneous QHS  . loteprednol  1 drop Both Eyes Daily  . mouth rinse  15 mL Mouth Rinse BID  . multivitamin  1 tablet Oral  Daily  . pantoprazole  40 mg Oral BID AC  . polyethylene glycol  17 g Oral Daily  . predniSONE  15 mg Oral Q breakfast  . pregabalin  75 mg Oral BID  . umeclidinium bromide  1 puff Inhalation Daily   Continuous Infusions: . ceFEPime (MAXIPIME) IV 1 g (12/24/17 0529)     LOS: 2 days    Time spent: 36 minutes    Mikaele Stecher,LAWAL, MD [facility] Pager [phone]  If 7PM-7AM, please contact night-coverage [email or web address] Password [facility] 12/24/2017, 9:36 AM

## 2017-12-24 NOTE — Telephone Encounter (Signed)
Pt's spouse, Britta Mccreedy is aware of below message. Britta Mccreedy voiced her understanding and had no further questions. Nothing further is needed.

## 2017-12-24 NOTE — Telephone Encounter (Signed)
[facility], fax #[phone].  States he is able to to see labs,x-rays,and CT scan but not the hospital notes.    Since patient has been back on methotrexate (2 weeks) has had a UTI on 12/15/17 and currently hospitalized for pneumonia.

## 2017-12-24 NOTE — Telephone Encounter (Signed)
Called and spoke to pt's spouse, Britta Mccreedy. Britta Mccreedy states pt is currently admitted at [facility] for PNA and also a RA flare.  Britta Mccreedy states that pt's Rheumatologist [facility]) is requesting that we fax current admission notes to [phone]. Per Britta Mccreedy- Dr. Olean Ree can only access labs and imaging.   VS please advise if okay to fax. Thanks

## 2017-12-24 NOTE — Telephone Encounter (Signed)
I routed information to Dr. Olean Ree through Epic.  If he doesn't receive this, then he would need to contact the hospital directly or have Mr. Gallon wife go to medical records.

## 2017-12-25 ENCOUNTER — Ambulatory Visit: Payer: Medicare HMO | Admitting: Internal Medicine

## 2017-12-25 ENCOUNTER — Encounter

## 2017-12-25 ENCOUNTER — Ambulatory Visit: Payer: Medicare HMO | Admitting: Cardiology

## 2017-12-25 LAB — CBC WITH DIFFERENTIAL/PLATELET
BASOS PCT: 0 %
Basophils Absolute: 0 10*3/uL (ref 0.0–0.1)
EOS PCT: 2 %
Eosinophils Absolute: 0 10*3/uL (ref 0.0–0.7)
HCT: 34.3 % — ABNORMAL LOW (ref 39.0–52.0)
HEMOGLOBIN: 11.2 g/dL — AB (ref 13.0–17.0)
LYMPHS ABS: 1.1 10*3/uL (ref 0.7–4.0)
LYMPHS PCT: 49 %
MCH: 28.5 pg (ref 26.0–34.0)
MCHC: 32.7 g/dL (ref 30.0–36.0)
MCV: 87.3 fL (ref 78.0–100.0)
MONOS PCT: 21 %
Monocytes Absolute: 0.4 10*3/uL (ref 0.1–1.0)
NEUTROS ABS: 0.6 10*3/uL — AB (ref 1.7–7.7)
Neutrophils Relative %: 28 %
Platelets: 131 10*3/uL — ABNORMAL LOW (ref 150–400)
RBC: 3.93 MIL/uL — ABNORMAL LOW (ref 4.22–5.81)
RDW: 16.3 % — ABNORMAL HIGH (ref 11.5–15.5)
WBC: 2.1 10*3/uL — ABNORMAL LOW (ref 4.0–10.5)

## 2017-12-25 LAB — BASIC METABOLIC PANEL
ANION GAP: 9 (ref 5–15)
BUN: 15 mg/dL (ref 6–20)
CO2: 25 mmol/L (ref 22–32)
Calcium: 8.5 mg/dL — ABNORMAL LOW (ref 8.9–10.3)
Chloride: 108 mmol/L (ref 101–111)
Creatinine, Ser: 0.52 mg/dL — ABNORMAL LOW (ref 0.61–1.24)
GFR calc Af Amer: >60 mL/min (ref >60)
GFR calc non Af Amer: >60 mL/min (ref >60)
GLUCOSE: 137 mg/dL — AB (ref 65–99)
POTASSIUM: 3.4 mmol/L — AB (ref 3.5–5.1)
Sodium: 142 mmol/L (ref 135–145)

## 2017-12-25 LAB — CULTURE, RESPIRATORY W GRAM STAIN

## 2017-12-25 LAB — GLUCOSE, CAPILLARY
GLUCOSE-CAPILLARY: 195 mg/dL — AB (ref 65–99)
Glucose-Capillary: 119 mg/dL — ABNORMAL HIGH (ref 65–99)
Glucose-Capillary: 174 mg/dL — ABNORMAL HIGH (ref 65–99)
Glucose-Capillary: 141 mg/dL — ABNORMAL HIGH (ref 65–99)

## 2017-12-25 LAB — CULTURE, RESPIRATORY: CULTURE: NORMAL

## 2017-12-25 NOTE — Progress Notes (Signed)
Patient ID: Joseph [name], male   DOB: May 04, 1951, 67 y.o.   MRN: [id number]  PROGRESS NOTE    BURDETT PINZON  BLT:[id number] DOB: 1950-12-16 DOA: 12/22/2017 PCP: Lennart Pall, MD   Outpatient Specialists: Dr. Olean Ree at [facility]  Brief Narrative:  67 year old gentleman admitted with acute on chronic respiratory failure secondary to severe bullous emphysema with infection. Patient also has bronchiectasis and chronic aspiration pneumonia.  He is chronically on oxygen at home at 3 L.  Patient was initially in the ICU under the care of CCM but now transferred to the flor. He has severe rheumatoid arthritis followed at [facility].  Currently started on methotrexate recently.  Patient has pancytopenia and therefore immunocompromised   Assessment & Plan:   Principal Problem:   Healthcare-associated pneumonia Active Problems:   Type 2 diabetes, controlled, with neuropathy (HCC)   CAD (coronary artery disease)   Chronic respiratory failure with hypoxia (HCC)   Rheumatoid arthritis with rheumatoid factor (HCC)   Chronic diastolic heart failure (HCC)   Fever   Current chronic use of systemic steroids   Hypokalemia   Thrombocytopenia (HCC)   Leucopenia   #1 healthcare associated pneumonia: Improved. Breathing better.  On 2 L of oxygen at the moment.  Continue treatment on mobilize patient. He has infected bulla recommendation is 2 weeks of oral antibiotics at discharge.  #2 COPD: Acute exacerbation.  Continue oxygen with nebulizer and steroids  #3 pancytopenia: White count is 2.1 and Neutrophils are 0.6. Will consult Hematology for possible GCSF infusion. Methotrexate has been stopped in the hospital.  #4 generalized debility: PT and OT has been consulted.  #5 diabetes: continue monitoring blood sugar as well as sliding scale insulin.  #6 hypokalemia: Repleted potassium   DVT prophylaxis: SCD Code Status: Full Family Communication: Wife at bedside Disposition Plan:  home with wife.  Patient planning to move to [facility]  Consultants:   CCM, Dr Vassie Loll  Procedures:   None   Antimicrobials:   Cefepime 4/29>>>>    Subjective: Patient is breathing better.  Denies any fever or chills denied any nausea vomiting or diarrhea.  He is worried about his white count and Neutrophil  Objective: Vitals:   12/25/17 0607 12/25/17 1104 12/25/17 1411 12/25/17 2047  BP: 132/76  125/82 (!) 100/54  Pulse: 79  90 67  Resp: 18  18 18   Temp: 97.7 F (36.5 C) 99.7 F (37.6 C) 98.7 F (37.1 C) 98.1 F (36.7 C)  TempSrc: Oral Oral Oral Oral  SpO2: 98%  98% 96%  Weight:      Height:       No intake or output data in the 24 hours ending 12/25/17 2337 Filed Weights   12/22/17 1030 12/22/17 2015  Weight: 95.3 kg (210 lb) 96.9 kg (213 lb 10 oz)    Examination:  General exam: Appears calm and comfortable  Respiratory system: Clear to auscultation. Respiratory effort normal. Cardiovascular system: S1 & S2 heard, RRR. No JVD, murmurs, rubs, gallops or clicks. No pedal edema. Gastrointestinal system: Abdomen is nondistended, soft and nontender. No organomegaly or masses felt. Normal bowel sounds heard. Central nervous system: Alert and oriented. No focal neurological deficits. Extremities: Symmetric 5 x 5 power. Skin: No rashes, lesions or ulcers Psychiatry: Judgement and insight appear normal. Mood & affect appropriate.     Data Reviewed: I have personally reviewed following labs and imaging studies  CBC: Recent Labs  Lab 12/22/17 1046 12/23/17 0329 12/24/17 0613 12/24/17 1000  12/25/17 0608  WBC 4.4 3.1* 2.6* 1.7* 2.1*  NEUTROABS 0.8*  --   --  0.5* 0.6*  HGB 13.9 12.3* 11.0* 11.0* 11.2*  HCT 39.5 38.2* 33.7* 32.8* 34.3*  MCV 85.1 88.2 86.9 86.1 87.3  PLT 147* 129* 122* 113* 131*   Basic Metabolic Panel: Recent Labs  Lab 12/22/17 1046 12/23/17 0329 12/24/17 0613 12/25/17 0608  NA 133* 136 137 142  K 3.8 3.4* 3.4* 3.4*  CL 98* 106 104  108  CO2 23 20* 24 25  GLUCOSE 125* 118* 121* 137*  BUN 16 11 13 15   CREATININE 0.69 0.60* 0.51* 0.52*  CALCIUM 8.6* 8.5* 8.3* 8.5*   GFR: Estimated Creatinine Clearance: 107 mL/min (A) (by C-G formula based on SCr of 0.52 mg/dL (L)). Liver Function Tests: Recent Labs  Lab 12/22/17 1046  AST 68*  ALT 66*  ALKPHOS 75  BILITOT 1.8*  PROT 6.8  ALBUMIN 3.4*   No results for input(s): LIPASE, AMYLASE in the last 168 hours. No results for input(s): AMMONIA in the last 168 hours. Coagulation Profile: No results for input(s): INR, PROTIME in the last 168 hours. Cardiac Enzymes: No results for input(s): CKTOTAL, CKMB, CKMBINDEX, TROPONINI in the last 168 hours. BNP (last 3 results) No results for input(s): PROBNP in the last 8760 hours. HbA1C: No results for input(s): HGBA1C in the last 72 hours. CBG: Recent Labs  Lab 12/24/17 2113 12/25/17 0742 12/25/17 1114 12/25/17 1655 12/25/17 2101  GLUCAP 137* 119* 195* 174* 141*   Lipid Profile: No results for input(s): CHOL, HDL, LDLCALC, TRIG, CHOLHDL, LDLDIRECT in the last 72 hours. Thyroid Function Tests: No results for input(s): TSH, T4TOTAL, FREET4, T3FREE, THYROIDAB in the last 72 hours. Anemia Panel: No results for input(s): VITAMINB12, FOLATE, FERRITIN, TIBC, IRON, RETICCTPCT in the last 72 hours. Urine analysis:    Component Value Date/Time   COLORURINE YELLOW 12/22/2017 1337   APPEARANCEUR CLEAR 12/22/2017 1337   LABSPEC 1.010 12/22/2017 1337   PHURINE 6.5 12/22/2017 1337   GLUCOSEU NEGATIVE 12/22/2017 1337   HGBUR MODERATE (A) 12/22/2017 1337   HGBUR negative 04/12/2008 1103   BILIRUBINUR NEGATIVE 12/22/2017 1337   KETONESUR 15 (A) 12/22/2017 1337   PROTEINUR NEGATIVE 12/22/2017 1337   UROBILINOGEN 0.2 10/17/2012 1236   NITRITE NEGATIVE 12/22/2017 1337   LEUKOCYTESUR NEGATIVE 12/22/2017 1337   Sepsis Labs: @LABRCNTIP (procalcitonin:4,lacticidven:4)  ) Recent Results (from the past 240 hour(s))  Blood  Culture (routine x 2)     Status: None (Preliminary result)   Collection Time: 12/22/17 10:46 AM  Result Value Ref Range Status   Specimen Description   Final    BLOOD LEFT ARM Performed at [facility], [address] [place], [place] [address]    Special Requests   Final    BOTTLES DRAWN AEROBIC AND ANAEROBIC Blood Culture adequate volume Performed at [facility], [address] [place], [place] [address]    Culture   Final    NO GROWTH 3 DAYS Performed at [facility], [address] [place], [place] [address]    Report Status PENDING  Incomplete  Blood Culture (routine x 2)     Status: None (Preliminary result)   Collection Time: 12/22/17 10:55 AM  Result Value Ref Range Status   Specimen Description   Final    BLOOD LEFT HAND Performed at [facility], [address] [place], [place] [address]    Special Requests   Final  BOTTLES DRAWN AEROBIC AND ANAEROBIC Blood Culture adequate volume Performed at [facility], [address] [place], [place] [address]    Culture   Final    NO GROWTH 3 DAYS Performed at [facility], [address] [place], [place] [address]    Report Status PENDING  Incomplete  MRSA PCR Screening     Status: None   Collection Time: 12/22/17  8:41 PM  Result Value Ref Range Status   MRSA by PCR NEGATIVE NEGATIVE Final    Comment:        The GeneXpert MRSA Assay (FDA approved for NASAL specimens only), is one component of a comprehensive MRSA colonization surveillance program. It is not intended to diagnose MRSA infection nor to guide or monitor treatment for MRSA infections. Performed at [facility], [address], [place], [place] [address]   Culture, sputum-assessment     Status: None   Collection Time: 12/23/17  1:10 AM  Result Value Ref Range Status   Specimen Description SPUTUM  Final   Special Requests NONE  Final   Sputum evaluation   Final    THIS  SPECIMEN IS ACCEPTABLE FOR SPUTUM CULTURE Performed at [facility], [name], [place], [place] [address]    Report Status 12/23/2017 FINAL  Final  Culture, respiratory (NON-Expectorated)     Status: None   Collection Time: 12/23/17  1:10 AM  Result Value Ref Range Status   Specimen Description   Final    SPUTUM Performed at [facility], [address] [place], [place] [address]    Special Requests   Final    NONE Reflexed from [id number] Performed at [facility], [address] [place], [place] [address]    Gram Stain   Final    FEW WBC PRESENT, PREDOMINANTLY PMN RARE GRAM POSITIVE COCCI    Culture   Final    FEW Consistent with normal respiratory flora. Performed at [facility], [address] [place], [place] [address]    Report Status 12/25/2017 FINAL  Final         Radiology Studies: Dg Chest Port 1 View  Result Date: 12/24/2017 CLINICAL DATA:  Pneumonia. EXAM: PORTABLE CHEST 1 VIEW COMPARISON:  CT scan and radiographs of December 22, 2017. FINDINGS: Stable cardiomediastinal silhouette. Status post coronary artery bypass graft. No pneumothorax is noted. Stable scarring is seen throughout both lungs, most prominently seen in right upper lobe. Left upper lobe bulla is again noted with significant wall thickening consistent with infection and surrounding pneumonia. Bony thorax is unremarkable. IMPRESSION: Left upper lobe bulla is again noted with significant wall thickening consistent with infection and surrounding pneumonia as described on prior CT scan. Electronically Signed   By: Lupita Raider, M.D.   On: 12/24/2017 10:00        Scheduled Meds: . aspirin EC  81 mg Oral Daily  . atorvastatin  80 mg Oral QHS  . dextromethorphan-guaiFENesin  1 tablet Oral QHS  . docusate sodium  100 mg Oral BID  . enoxaparin (LOVENOX) injection  40 mg Subcutaneous Q24H  . fluticasone furoate-vilanterol  1 puff  Inhalation Daily  . folic acid  1 mg Oral Daily  . ibuprofen  800 mg Oral TID  . insulin aspart  0-15 Units Subcutaneous TID WC  . insulin aspart  0-5 Units Subcutaneous QHS  . loteprednol  1 drop Both Eyes Daily  . mouth rinse  15 mL Mouth Rinse BID  . multivitamin  1 tablet Oral Daily  . pantoprazole  40 mg Oral BID AC  . polyethylene glycol  17 g Oral Daily  . predniSONE  15 mg Oral Q breakfast  . pregabalin  75 mg Oral BID  . umeclidinium bromide  1 puff Inhalation Daily   Continuous Infusions: . ceFEPime (MAXIPIME) IV 1 g (12/25/17 2123)     LOS: 3 days    Time spent: 36 minutes    Gerardine Peltz,LAWAL, MD [facility] Pager [phone]  If 7PM-7AM, please contact night-coverage [email or web address] Password [facility] 12/25/2017, 11:37 PM

## 2017-12-25 NOTE — Care Management Important Message (Signed)
Important Message  Patient Details  Name: Joseph [name] MRN: [id number] Date of Birth: Aug 26, 1951   Medicare Important Message Given:  Yes    Caren Macadam 12/25/2017, 10:35 AMImportant Message  Patient Details  Name: Joseph [name] MRN: [id number] Date of Birth: 1950-12-13   Medicare Important Message Given:  Yes    Caren Macadam 12/25/2017, 10:35 AM

## 2017-12-25 NOTE — Evaluation (Signed)
Physical Therapy Evaluation Patient Details Name: Joseph [name] MRN: [id number] DOB: 27-Mar-1951 Today's Date: 12/25/2017   History of Present Illness  67 year old gentleman admitted with acute on chronic respiratory failure secondary to severe bullous emphysema with infection. Patient also has bronchiectasis and chronic aspiration pneumonia.  He is chronically on oxygen at home at 3 L.  Patient was initially in the ICU under the care of CCM but now transferred to the floor. He has severe rheumatoid arthritis followed at [facility].  Currently started on methotrexate recently.  Patient has pancytopenia and therefore immunocompromised.   Clinical Impression  Patient performed well considering his overall health status ambulating 350 feet in a 4-wheeled walker/gait trainer on 3L O2 with min guard and followed by a chair for safety. Patient required minimal assistance to stand from bed and return. Patient wears mask while out of room due to immunocompromised state. Pt admitted with above diagnosis. Pt currently with functional limitations due to the deficits listed below (see PT Problem List).  Pt will benefit from skilled PT to increase their independence and safety with mobility to allow discharge to the venue listed below.       Follow Up Recommendations Supervision/Assistance - 24 hour;Home health PT    Equipment Recommendations       Recommendations for Other Services       Precautions / Restrictions Precautions Precautions: Fall Restrictions Weight Bearing Restrictions: No      Mobility  Bed Mobility Overal bed mobility: Modified Independent                Transfers Overall transfer level: Needs assistance Equipment used: Ambulation equipment usedFinancial risk analyst) Transfers: Sit to/from [employer] Sit to Stand: Min assist Stand pivot transfers: Min guard          Ambulation/Gait Ambulation/Gait assistance: Min  guard;Min assist Ambulation Distance (Feet): 350 Feet Assistive device: None(platform 4-wheeled walker/gait trainer) Gait Pattern/deviations: Step-through pattern;Trunk flexed     General Gait Details: patient wears bilateral diabetic shoes with additional padding due to peripheral neuropathy.  Stairs            Wheelchair Mobility    Modified Rankin (Stroke Patients Only)       Balance Overall balance assessment: Needs assistance Sitting-balance support: No upper extremity supported Sitting balance-Leahy Scale: Good     Standing balance support: Bilateral upper extremity supported;During functional activity Standing balance-Leahy Scale: Fair                               Pertinent Vitals/Pain Pain Assessment: 0-10 Pain Score: 3  Pain Descriptors / Indicators: Aching Pain Intervention(s): Monitored during session    Home Living Family/patient expects to be discharged to:: Private residence Living Arrangements: Spouse/significant other Available Help at Discharge: Family;Available 24 hours/day Type of Home: House Home Access: Level entry     Home Layout: Two level Home Equipment: Walker - 4 wheels;Electric scooter;Wheelchair - manual;Grab bars - tub/shower;Grab bars - toilet;Shower seat - built in;Other (comment)      Prior Function Level of Independence: Independent with assistive device(s)         Comments: pt reported that on his "good days" he can ambulate short distances in his home with use of an 4wheeled 2 walker and is independent with ADLs.     Hand Dominance        Extremity/Trunk Assessment   Upper Extremity Assessment Upper Extremity Assessment: Defer  to OT evaluation    Lower Extremity Assessment Lower Extremity Assessment: Generalized weakness    Cervical / Trunk Assessment Cervical / Trunk Assessment: Kyphotic  Communication   Communication: No difficulties  Cognition Arousal/Alertness: Awake/alert Behavior  During Therapy: WFL for tasks assessed/performed Overall Cognitive Status: Within Functional Limits for tasks assessed                                        General Comments      Exercises     Assessment/Plan    PT Assessment Patient needs continued PT services  PT Problem List Decreased strength;Decreased mobility;Decreased activity tolerance;Decreased balance       PT Treatment Interventions Therapeutic activities;Gait training;Therapeutic exercise;Functional mobility training;Patient/family education    PT Goals (Current goals can be found in the Care Plan section)  Acute Rehab PT Goals Patient Stated Goal: return home under the care of his wife PT Goal Formulation: With patient/family Time For Goal Achievement: 01/01/18 Potential to Achieve Goals: Fair    Frequency Min 3X/week   Barriers to discharge        Co-evaluation               AM-PAC PT "6 Clicks" Daily Activity  Outcome Measure Difficulty turning over in bed (including adjusting bedclothes, sheets and blankets)?: None Difficulty moving from lying on back to sitting on the side of the bed? : A Little Difficulty sitting down on and standing up from a chair with arms (e.g., wheelchair, bedside commode, etc,.)?: A Little Help needed moving to and from a bed to chair (including a wheelchair)?: A Little Help needed walking in hospital room?: A Little Help needed climbing 3-5 steps with a railing? : A Lot 6 Click Score: 18    End of Session Equipment Utilized During Treatment: Gait belt(during ambulation, wife followed with recliner) Activity Tolerance: Patient tolerated treatment well Patient left: in bed;with family/visitor present Nurse Communication: Mobility status PT Visit Diagnosis: Unsteadiness on feet (R26.81);Muscle weakness (generalized) (M62.81);Difficulty in walking, not elsewhere classified (R26.2)    Time: 1202-1253 PT Time Calculation (min) (ACUTE ONLY): 51  min   Charges:   PT Evaluation $PT Eval Moderate Complexity: 1 Mod PT Treatments $Gait Training: 8-22 mins $Therapeutic Activity: 8-22 mins   PT G Codes:        Casidee Jann D. Hartnett-Rands, MS, PT [facility] PT [facility] [phone] 12/25/2017, 1:21 PM

## 2017-12-26 DIAGNOSIS — L409 Psoriasis, unspecified: Secondary | ICD-10-CM

## 2017-12-26 DIAGNOSIS — D696 Thrombocytopenia, unspecified: Secondary | ICD-10-CM

## 2017-12-26 DIAGNOSIS — J9621 Acute and chronic respiratory failure with hypoxia: Secondary | ICD-10-CM

## 2017-12-26 DIAGNOSIS — E1140 Type 2 diabetes mellitus with diabetic neuropathy, unspecified: Secondary | ICD-10-CM

## 2017-12-26 LAB — GLUCOSE, CAPILLARY
GLUCOSE-CAPILLARY: 95 mg/dL (ref 65–99)
Glucose-Capillary: 120 mg/dL — ABNORMAL HIGH (ref 65–99)

## 2017-12-26 LAB — CBC WITH DIFFERENTIAL/PLATELET
Basophils Absolute: 0 10*3/uL (ref 0.0–0.1)
Basophils Relative: 0 %
EOS ABS: 0 10*3/uL (ref 0.0–0.7)
Eosinophils Relative: 1 %
HEMATOCRIT: 33.7 % — AB (ref 39.0–52.0)
HEMOGLOBIN: 11.2 g/dL — AB (ref 13.0–17.0)
Lymphocytes Relative: 44 %
Lymphs Abs: 1.3 10*3/uL (ref 0.7–4.0)
MCH: 28.9 pg (ref 26.0–34.0)
MCHC: 33.2 g/dL (ref 30.0–36.0)
MCV: 86.9 fL (ref 78.0–100.0)
MONOS PCT: 20 %
Monocytes Absolute: 0.6 10*3/uL (ref 0.1–1.0)
NEUTROS ABS: 1.1 10*3/uL — AB (ref 1.7–7.7)
NEUTROS PCT: 35 %
Platelets: 133 10*3/uL — ABNORMAL LOW (ref 150–400)
RBC: 3.88 MIL/uL — AB (ref 4.22–5.81)
RDW: 16.3 % — ABNORMAL HIGH (ref 11.5–15.5)
WBC: 3 10*3/uL — ABNORMAL LOW (ref 4.0–10.5)

## 2017-12-26 LAB — BASIC METABOLIC PANEL
Anion gap: 10 (ref 5–15)
BUN: 14 mg/dL (ref 6–20)
CHLORIDE: 107 mmol/L (ref 101–111)
CO2: 23 mmol/L (ref 22–32)
CREATININE: 0.51 mg/dL — AB (ref 0.61–1.24)
Calcium: 9 mg/dL (ref 8.9–10.3)
GFR calc non Af Amer: >60 mL/min (ref >60)
Glucose, Bld: 100 mg/dL — ABNORMAL HIGH (ref 65–99)
Potassium: 3.5 mmol/L (ref 3.5–5.1)
Sodium: 140 mmol/L (ref 135–145)

## 2017-12-26 MED ORDER — AMOXICILLIN-POT CLAVULANATE 875-125 MG PO TABS: 1.0000 | ORAL_TABLET | Freq: Two times a day (BID) | ORAL | 0 refills | Status: DC

## 2017-12-26 MED ORDER — CLOBETASOL PROPIONATE 0.05 % EX CREA
1.0000 "application " | TOPICAL_CREAM | Freq: Two times a day (BID) | CUTANEOUS | Status: DC
  Administered 2017-12-26: 1 via TOPICAL
  Filled 2017-12-26: qty 15

## 2017-12-26 MED ORDER — POLYETHYLENE GLYCOL 3350 17 G PO PACK: 17.0000 g | PACK | Freq: Every day | ORAL | 0 refills | Status: DC

## 2017-12-26 NOTE — Care Management Note (Signed)
Case Management Note  Patient Details  Name: Joseph [name] MRN: [id number] Date of Birth: Feb 08, 1951  Subjective/Objective:                  Fever and nutropenic with wbc 3.0  Action/Plan: Date: Dec 26, 2017 Marcelle Smiling, BSN, [place], [place] [phone] Chart and notes review for patient progress and needs. Will follow for case management and discharge needs. No cm or discharge needs present at time of this review. Next review date: [id number]  Expected Discharge Date:  12/26/17               Expected Discharge Plan:  Home/Self Care  In-House Referral:     Discharge planning Services  CM Consult  Post Acute Care Choice:    Choice offered to:     DME Arranged:    DME Agency:     HH Arranged:    HH Agency:     Status of Service:  In process, will continue to follow  If discussed at Long Length of Stay Meetings, dates discussed:    Additional Comments:  Golda Acre, RN 12/26/2017, 9:47 AM

## 2017-12-26 NOTE — Progress Notes (Signed)
Patient and wife would like the code status changed to the following: no compressions but otherwise he would like to have all other measures.   Calvert Cantor, MD

## 2017-12-26 NOTE — Discharge Summary (Addendum)
Physician Discharge Summary  OTHON GUARDIA UPJ:[id number] DOB: [birth year] DOA: 12/22/2017  PCP: Lennart Pall, MD  Admit date: 12/22/2017 Discharge date: 12/26/2017  Admitted From: Home Disposition: Home   Recommendations for Outpatient Follow-up:  1. follow-up leukopenia  Home Health: Home health PT Equipment/Devices: Stable  Discharge Condition: Stable CODE STATUS:   No compressions - all other measures to be performed Consultations:  Pulmonary critical care   Discharge Diagnoses:  Principal Problem:   Healthcare-associated pneumonia Active Problems: Acute on chronic respiratory failure COPD exacerbation Leukopenia   Type 2 diabetes, controlled, with neuropathy (HCC)   CAD (coronary artery disease)     Rheumatoid arthritis with rheumatoid factor (HCC)    Chronic grade 1 diastolic heart failure (HCC)    Current chronic use of systemic steroids    Hypokalemia    Thrombocytopenia (HCC)    Psoriasis     Brief Summary: 67 year old gentleman admitted with rheumatoid arthritis, COPD on 3 L O2, issues with leukopenia thought to be due to Methotrexate.  Presented with acute on chronic respiratory failure with imaging showing LUL Pneumonia and inflammation surrounding a bullae.   Hospital Course:  Acute resp failure with HCAP - pulmonary consulted on 4/30 and suspected infection in LUL Bulla -Blood cultures x2 sets negative-sputum culture showing normal flora -Pulmonary recommending 14 days of oral antibiotics (Augmentin or Levaquin) and f/u in 2 wks in the office with re-imaging (first CXR and then CT) - breathing without difficulty with minimal cough - will send home with Augmentin for 10 days -He has been on 15 mg of prednisone for rheumatoid arthritis and can continue this  Pancytopenia - WBC dropped to 1.7 on 5/1 with Neutrophils at 500 2 days ago. Counts have improve to 3.0 and 1.1 respectively - Hb dropped from 13 to 11 (now 11.2) and platelets from 147 to 113  (now 133) - the patient has been seen by a hematologist at [facility] but wanted a second opinion. Dr Mikeal Hawthorne spoke with Dr Arbutus Ped who recommended to continue to hold the Methotrexate and f/u with his outpt hematologist- this was conveyed to the patient and his wife today by Dr Mikeal Hawthorne over the phone   Rheumatoid arthritis - cont Prednisone- holding  Methotrexate- the patient states he is currently pain free  Psoriasis - cont topical steroids  HTN / gr 1 D CHF - Furosemide dose not changed    Discharge Exam: Vitals:   12/26/17 0609 12/26/17 1200  BP: 116/70   Pulse: 75   Resp: 18   Temp: 97.7 F (36.5 C) 99 F (37.2 C)  SpO2: 98%    Vitals:   12/25/17 1411 12/25/17 2047 12/26/17 0609 12/26/17 1200  BP: 125/82 (!) 100/54 116/70   Pulse: 90 67 75   Resp: 18 18 18    Temp: 98.7 F (37.1 C) 98.1 F (36.7 C) 97.7 F (36.5 C) 99 F (37.2 C)  TempSrc: Oral Oral Oral Oral  SpO2: 98% 96% 98%   Weight:      Height:        General: Pt is alert, awake, not in acute distress Cardiovascular: RRR, S1/S2 +, no rubs, no gallops Respiratory: CTA bilaterally, no wheezing, no rhonchi Abdominal: Soft, NT, ND, bowel sounds + Extremities: no edema, no cyanosis   Discharge Instructions  Discharge Instructions    Diet - low sodium heart healthy   Complete by:  As directed    Diet Carb Modified   Complete by:  As directed    Increase  activity slowly   Complete by:  As directed      Allergies as of 12/26/2017   No Known Allergies     Medication List    STOP taking these medications   methotrexate 50 MG/2ML injection     TAKE these medications   albuterol 108 (90 Base) MCG/ACT inhaler Commonly known as:  PROAIR HFA Inhale 2 puffs into the lungs every 4 (four) hours as needed for wheezing or shortness of breath.   amoxicillin-clavulanate 875-125 MG tablet Commonly known as:  AUGMENTIN Take 1 tablet by mouth every 12 (twelve) hours for 10 days.   aspirin EC 81 MG tablet Take 81  mg by mouth daily.   atorvastatin 80 MG tablet Commonly known as:  LIPITOR TAKE 1 TABLET (80mg ) BY MOUTH DAILY What changed:    how much to take  how to take this  when to take this  additional instructions   clobetasol cream 0.05 % Commonly known as:  TEMOVATE Apply 1 application topically 2 (two) times daily as needed.   dextromethorphan-guaiFENesin 30-600 MG 12hr tablet Commonly known as:  MUCINEX DM Take 1 tablet by mouth 2 (two) times daily. What changed:  when to take this   folic acid 800 MCG tablet Commonly known as:  FOLVITE Take 800 mcg by mouth daily.   furosemide 20 MG tablet Commonly known as:  LASIX Take 3 tablets (60 mg total) by mouth daily. What changed:  additional instructions   hydrocortisone cream 1 % Apply 1 application topically 3 (three) times daily as needed for itching.   ipratropium-albuterol 0.5-2.5 (3) MG/3ML Soln Commonly known as:  DUONEB Take 3 mLs by nebulization every 4 (four) hours as needed (shortness of breath or wheezing).   LOTEMAX 0.5 % Gel Generic drug:  Loteprednol Etabonate Place 1 drop into both eyes every morning. daily   Melatonin 1 MG Caps Take 4 mg by mouth as needed (Sleep).   nitroGLYCERIN 0.4 MG SL tablet Commonly known as:  NITROSTAT Place 1 tablet (0.4 mg total) under the tongue every 5 (five) minutes as needed for chest pain (up to 3 doses for severe chest pain. If no relief after 3rd dose, proceed to the ED for an evaluation).   OVER THE COUNTER MEDICATION Apply topically as needed. T-Gel Shampoo   oxyCODONE-acetaminophen 7.5-325 MG tablet Commonly known as:  PERCOCET Take 1 tablet by mouth every 6 (six) hours as needed for moderate pain.   OXYGEN Inhale 3 L into the lungs continuous.   pantoprazole 40 MG tablet Commonly known as:  PROTONIX Take 1 tablet (40 mg total) by mouth 2 (two) times daily.   PERFECT IRON PO Take 50 mg by mouth every morning.   polyethylene glycol packet Commonly known  as:  MIRALAX / GLYCOLAX Take 17 g by mouth daily. Start taking on:  12/27/2017   potassium chloride 10 MEQ tablet Commonly known as:  KLOR-CON 10 TAKE 4 TABLETS ( ) BY MOUTH EVERY MORNING AND TAKE 2 TABLETS ( ) EVERY EVENING What changed:  additional instructions   predniSONE 20 MG tablet Commonly known as:  DELTASONE Take 15 mg by mouth daily with breakfast.   pregabalin 75 MG capsule Commonly known as:  LYRICA Take 75 mg by mouth 2 (two) times daily.   sodium chloride 0.65 % Soln nasal spray Commonly known as:  OCEAN Place 1 spray into both nostrils as needed for congestion.   SUPER B COMPLEX PO Take 1 tablet by mouth daily.   TRELEGY ELLIPTA  100-62.5-25 MCG/INH Aepb Generic drug:  Fluticasone-Umeclidin-Vilant Inhale 1 puff into the lungs daily.   vitamin C 500 MG tablet Commonly known as:  ASCORBIC ACID Take 500 mg by mouth daily.   VITEYES AREDS ADVANCED PO Take 1 tablet by mouth daily.      Follow-up Information    [name], [name], MD Follow up.   Specialty:  Internal Medicine Contact information: [address] [place] [address] [phone]        Oretha Milch, MD Follow up.   Specialty:  Pulmonary Disease Why:  he would like to see you in 2 wks to f/u on the pneumonia Contact information: [address] [place] [address] [phone]          No Known Allergies   Procedures/Studies:  Dg Chest 2 View  Result Date: 12/22/2017 CLINICAL DATA:  Rheumatoid flare up with fever for 2 days. Ex-smoker. EXAM: CHEST - 2 VIEW COMPARISON:  Radiographs 10/27/2017 and 11/08/2017.  CT 08/14/2017. FINDINGS: The heart size and mediastinal contours are stable status post median sternotomy and CABG. There is aortic atherosclerosis. Diffuse central airway thickening is present. There are multiple upper lobe bulla. Dominant left upper lobe lesion demonstrates interval increased wall thickening which may be secondary to superimposed  infection or hemorrhage. There is no focal airspace disease, pleural effusion or pneumothorax. No acute osseous findings are evident. IMPRESSION: Interval increased wall thickening of dominant left upper lobe bulla, possibly due to super infection or hemorrhage. Diffuse central airway thickening. No focal airspace disease. Electronically Signed   By: Carey Bullocks M.D.   On: 12/22/2017 10:50   Ct Chest W Contrast  Result Date: 12/22/2017 CLINICAL DATA:  Fever EXAM: CT CHEST WITH CONTRAST TECHNIQUE: Multidetector CT imaging of the chest was performed during intravenous contrast administration. CONTRAST:  37mL ISOVUE-300 IOPAMIDOL (ISOVUE-300) INJECTION 61% COMPARISON:  Chest radiograph December 22, 2016; chest CT August 14, 2017 FINDINGS: Cardiovascular: There is no thoracic aortic aneurysm or dissection. There is calcification at the origins of the left common carotid and left subclavian arteries. Mild scattered calcification is noted elsewhere the great vessels. There is aortic atherosclerosis. There are foci of native coronary artery calcification. The patient is status post coronary artery bypass grafting. There is no major vessel pulmonary embolus. No pericardial effusion or pericardial thickening is evident. Mediastinum/Nodes: Thyroid appears normal. There are multiple subcentimeter mediastinal lymph nodes. There is a subcarinal lymph node measuring 1.3 x 1.0 cm. There is a second sub- carinal lymph node measuring 1.2 x 1.0 cm. There is a small hiatal hernia. Lungs/Pleura: There is underlying bullous emphysematous change. Extensive bullous disease is noted in both upper lobes. In comparison with the previous study, there is a bulla in the posterior segment left upper lobe measuring 7.7 x 4.6 cm. The wall of this bulla has become considerably thicker compared to most recent study. There is also thickening along the nearby major fissure with localized consolidation adjacent to the major fissure in the  posterior segment of the left upper lobe. There is no other area of consolidation. There are areas of scarring and atelectatic change in the lung bases. There are scattered areas of fibrosis which appear stable. There is a nodular opacity along the right major fissure measuring 9 x 5 mm, stable and likely representing an intrafissural lymph node. Upper Abdomen: Gallbladder is absent. Spleen is incompletely visualized. Spleen measures 13.8 cm from anterior to posterior dimension which is prominent. There is upper abdominal aortic atherosclerosis.  Musculoskeletal: No blastic or lytic bone lesions are evident. Patient is status post median sternotomy. No chest wall lesions are evident. IMPRESSION: 1. 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 These changes are similar compared to prior study. Note, however, that the largest bulla, located in the posterior segment left upper lobe, shows considerable wall thickening, a change from the previous study. There is a nearby area of airspace consolidation in the posterior segment of the left upper lobe adjacent to the major fissure, felt to represent pneumonia. The thickening of the wall of the bulla is most likely due to infection of this bulla. Atypical cystic neoplasm may present in this manner and cannot be entirely excluded at this time. Note that there is no evidence of mycetoma in this bulla or elsewhere. Given this circumstance, would advise repeat CT after treatment for pneumonia to assess for stability or possible resolution of wall thickening in this bulla. Given the possibility of underlying neoplasm, close clinical and imaging surveillance advised in this regard. 2. Mildly prominent sub-carinal lymph nodes of uncertain etiology. These lymph nodes could well be reactive given the parenchymal lung changes. 3. Status post coronary artery bypass grafting. There is aortic and great vessel atherosclerotic calcification as well  as calcification in native coronary arteries. 4.  Small hiatal hernia. 5.  Spleen appears enlarged although incompletely visualized. 6.  Gallbladder surgically absent. Aortic Atherosclerosis (ICD10-I70.0) and Emphysema (ICD10-J43.9). Electronically Signed   By: Bretta Bang III M.D.   On: 12/22/2017 13:17   Dg Chest Port 1 View  Result Date: 12/24/2017 CLINICAL DATA:  Pneumonia. EXAM: PORTABLE CHEST 1 VIEW COMPARISON:  CT scan and radiographs of December 22, 2017. FINDINGS: Stable cardiomediastinal silhouette. Status post coronary artery bypass graft. No pneumothorax is noted. Stable scarring is seen throughout both lungs, most prominently seen in right upper lobe. Left upper lobe bulla is again noted with significant wall thickening consistent with infection and surrounding pneumonia. Bony thorax is unremarkable. IMPRESSION: Left upper lobe bulla is again noted with significant wall thickening consistent with infection and surrounding pneumonia as described on prior CT scan. Electronically Signed   By: Lupita Raider, M.D.   On: 12/24/2017 10:00     The results of significant diagnostics from this hospitalization (including imaging, microbiology, ancillary and laboratory) are listed below for reference.     Microbiology: Recent Results (from the past 240 hour(s))  Blood Culture (routine x 2)     Status: None (Preliminary result)   Collection Time: 12/22/17 10:46 AM  Result Value Ref Range Status   Specimen Description   Final    BLOOD LEFT ARM Performed at [facility], [address] [place], [place] [address]    Special Requests   Final    BOTTLES DRAWN AEROBIC AND ANAEROBIC Blood Culture adequate volume Performed at [facility], [address] [place], [place] [address]    Culture   Final    NO GROWTH 3 DAYS Performed at [facility], [address], [place], [place] [address]    Report Status PENDING  Incomplete  Blood Culture (routine x 2)      Status: None (Preliminary result)   Collection Time: 12/22/17 10:55 AM  Result Value Ref Range Status   Specimen Description   Final    BLOOD LEFT HAND Performed at [facility], [address] [place], [place] [address]    Special Requests   Final  BOTTLES DRAWN AEROBIC AND ANAEROBIC Blood Culture adequate volume Performed at [facility], [address] [place], [place] [address]    Culture   Final    NO GROWTH 3 DAYS Performed at [facility], [address] [place], [place] [address]    Report Status PENDING  Incomplete  MRSA PCR Screening     Status: None   Collection Time: 12/22/17  8:41 PM  Result Value Ref Range Status   MRSA by PCR NEGATIVE NEGATIVE Final    Comment:        The GeneXpert MRSA Assay (FDA approved for NASAL specimens only), is one component of a comprehensive MRSA colonization surveillance program. It is not intended to diagnose MRSA infection nor to guide or monitor treatment for MRSA infections. Performed at [facility], [address], [place], [place] [address]   Culture, sputum-assessment     Status: None   Collection Time: 12/23/17  1:10 AM  Result Value Ref Range Status   Specimen Description SPUTUM  Final   Special Requests NONE  Final   Sputum evaluation   Final    THIS SPECIMEN IS ACCEPTABLE FOR SPUTUM CULTURE Performed at [facility], [address] [place], [place] [address]    Report Status 12/23/2017 FINAL  Final  Culture, respiratory (NON-Expectorated)     Status: None   Collection Time: 12/23/17  1:10 AM  Result Value Ref Range Status   Specimen Description   Final    SPUTUM Performed at [facility], [address], [place], [place] [address]    Special Requests   Final    NONE Reflexed from [id number] Performed at [facility], [address], [place], [place] [address]    Gram Stain   Final    FEW WBC PRESENT,  PREDOMINANTLY PMN RARE GRAM POSITIVE COCCI    Culture   Final    FEW Consistent with normal respiratory flora. Performed at [facility], [address], [place], [place] [address]    Report Status 12/25/2017 FINAL  Final     Labs: BNP (last 3 results) No results for input(s): BNP in the last 8760 hours. Basic Metabolic Panel: Recent Labs  Lab 12/22/17 1046 12/23/17 0329 12/24/17 0613 12/25/17 0608 12/26/17 0612  NA 133* 136 137 142 140  K 3.8 3.4* 3.4* 3.4* 3.5  CL 98* 106 104 108 107  CO2 23 20* 24 25 23   GLUCOSE 125* 118* 121* 137* 100*  BUN 16 11 13 15 14   CREATININE 0.69 0.60* 0.51* 0.52* 0.51*  CALCIUM 8.6* 8.5* 8.3* 8.5* 9.0   Liver Function Tests: Recent Labs  Lab 12/22/17 1046  AST 68*  ALT 66*  ALKPHOS 75  BILITOT 1.8*  PROT 6.8  ALBUMIN 3.4*   No results for input(s): LIPASE, AMYLASE in the last 168 hours. No results for input(s): AMMONIA in the last 168 hours. CBC: Recent Labs  Lab 12/22/17 1046 12/23/17 0329 12/24/17 0613 12/24/17 1000 12/25/17 0608 12/26/17 0612  WBC 4.4 3.1* 2.6* 1.7* 2.1* 3.0*  NEUTROABS 0.8*  --   --  0.5* 0.6* 1.1*  HGB 13.9 12.3* 11.0* 11.0* 11.2* 11.2*  HCT 39.5 38.2* 33.7* 32.8* 34.3* 33.7*  MCV 85.1 88.2 86.9 86.1 87.3 86.9  PLT 147* 129* 122* 113* 131* 133*   Cardiac Enzymes: No results for input(s): CKTOTAL, CKMB, CKMBINDEX, TROPONINI in the last 168 hours. BNP: Invalid input(s): POCBNP CBG: Recent Labs  Lab 12/25/17 1114 12/25/17 1655 12/25/17 2101 12/26/17 0727 12/26/17 1214  GLUCAP 195* 174* 141* 95 120*   D-Dimer No results for input(s): DDIMER in the last 72 hours. Hgb A1c No results for input(s): HGBA1C in the last 72 hours. Lipid Profile No results for input(s): CHOL, HDL, LDLCALC, TRIG, CHOLHDL, LDLDIRECT in the last 72 hours. Thyroid function studies No results for input(s): TSH, T4TOTAL, T3FREE, THYROIDAB in the last 72 hours.  Invalid input(s): FREET3 Anemia work up No results  for input(s): VITAMINB12, FOLATE, FERRITIN, TIBC, IRON, RETICCTPCT in the last 72 hours. Urinalysis    Component Value Date/Time   COLORURINE YELLOW 12/22/2017 1337   APPEARANCEUR CLEAR 12/22/2017 1337   LABSPEC 1.010 12/22/2017 1337   PHURINE 6.5 12/22/2017 1337   GLUCOSEU NEGATIVE 12/22/2017 1337   HGBUR MODERATE (A) 12/22/2017 1337   HGBUR negative 04/12/2008 1103   BILIRUBINUR NEGATIVE 12/22/2017 1337   KETONESUR 15 (A) 12/22/2017 1337   PROTEINUR NEGATIVE 12/22/2017 1337   UROBILINOGEN 0.2 10/17/2012 1236   NITRITE NEGATIVE 12/22/2017 1337   LEUKOCYTESUR NEGATIVE 12/22/2017 1337   Sepsis Labs Invalid input(s): PROCALCITONIN,  WBC,  LACTICIDVEN Microbiology Recent Results (from the past 240 hour(s))  Blood Culture (routine x 2)     Status: None (Preliminary result)   Collection Time: 12/22/17 10:46 AM  Result Value Ref Range Status   Specimen Description   Final    BLOOD LEFT ARM Performed at [facility], [address] [place], [place] [address]    Special Requests   Final    BOTTLES DRAWN AEROBIC AND ANAEROBIC Blood Culture adequate volume Performed at [facility], [address] [place], [place] [address]    Culture   Final    NO GROWTH 3 DAYS Performed at [facility], [address] [place], [place] [address]    Report Status PENDING  Incomplete  Blood Culture (routine x 2)     Status: None (Preliminary result)   Collection Time: 12/22/17 10:55 AM  Result Value Ref Range Status   Specimen Description   Final    BLOOD LEFT HAND Performed at [facility], [address] [place], [place] [address]    Special Requests   Final    BOTTLES DRAWN AEROBIC AND ANAEROBIC Blood Culture adequate volume Performed at [facility], [address] [place], [place] [address]    Culture   Final    NO GROWTH 3 DAYS Performed at [facility], [address] [place], [place] [address]    Report Status PENDING   Incomplete  MRSA PCR Screening     Status: None   Collection Time: 12/22/17  8:41 PM  Result Value Ref Range Status   MRSA by PCR NEGATIVE NEGATIVE Final    Comment:        The GeneXpert MRSA Assay (FDA approved for NASAL specimens only), is one component of a comprehensive MRSA colonization surveillance program. It is not intended to diagnose MRSA infection nor to guide or monitor treatment for MRSA infections. Performed at [facility], [address] [place], [place] [address]   Culture, sputum-assessment     Status: None   Collection Time: 12/23/17  1:10 AM  Result Value Ref Range Status   Specimen Description SPUTUM  Final   Special Requests NONE  Final   Sputum evaluation   Final    THIS SPECIMEN IS ACCEPTABLE FOR SPUTUM CULTURE  Performed at [facility], [address], [place], [place] [address]    Report Status 12/23/2017 FINAL  Final  Culture, respiratory (NON-Expectorated)     Status: None   Collection Time: 12/23/17  1:10 AM  Result Value Ref Range Status   Specimen Description   Final    SPUTUM Performed at [facility], [address] [place], [place] [address]    Special Requests   Final    NONE Reflexed from [id number] Performed at [facility], [address] [place], [place] [address]    Gram Stain   Final    FEW WBC PRESENT, PREDOMINANTLY PMN RARE GRAM POSITIVE COCCI    Culture   Final    FEW Consistent with normal respiratory flora. Performed at [facility], [address] [place], [place] [address]    Report Status 12/25/2017 FINAL  Final     Time coordinating discharge: 65 min  SIGNED:   Calvert Cantor, MD  [facility] 12/26/2017, 1:27 PM Pager   If 7PM-7AM, please contact night-coverage [email or web address] Password [email or web address]

## 2017-12-26 NOTE — Progress Notes (Signed)
Patient's spouse requesting to speak to the supervisor regarding concern of code status and concerns . AC spoke to the patient and spouse at long length. MD paged and code status changed. Patient and wife has no concerns at this time.

## 2017-12-26 NOTE — Discharge Instructions (Signed)
The pulmonologist would like to see him in the office in 2 weeks- continue 10 more days of Augmentin which will complete a 14-day course of antibiotics.  Please follow-up with your primary care doctor in 1 week and have further discussion about your white blood cell count.  Your doctor will let you know if more blood work is needed.  Physical therapist recommends that we order home health physical therapy for you which has been ordered.  You were cared for by a hospitalist during your hospital stay. If you have any questions about your discharge medications or the care you received while you were in the hospital after you are discharged, you can call the unit and asked to speak with the hospitalist on call if the hospitalist that took care of you is not available. Once you are discharged, your primary care physician will handle any further medical issues. Please note that NO REFILLS for any discharge medications will be authorized once you are discharged, as it is imperative that you return to your primary care physician (or establish a relationship with a primary care physician if you do not have one) for your aftercare needs so that they can reassess your need for medications and monitor your lab values.  Please take all your medications with you for your next visit with your Primary MD. Please ask your Primary MD to get all Hospital records sent to his/her office. Please request your Primary MD to go over all hospital test results at the follow up.    If you experience worsening of your admission symptoms, develop shortness of breath, chest pain, suicidal or homicidal thoughts or a life threatening emergency, you must seek medical attention immediately by calling 911 or calling your MD.   Bonita Quin must read the complete instructions/literature along with all the possible adverse reactions/side effects for all the medicines you take including new medications that have been prescribed to you. Take new  medicines after you have completely understood and accpet all the possible adverse reactions/side effects.    Do not drive when taking pain medications or sedatives.     Do not take more than prescribed Pain, Sleep and Anxiety Medications   If you have smoked or chewed Tobacco in the last 2 yrs please stop. Stop any regular alcohol  and or recreational drug use.   Wear Seat belts while driving.

## 2017-12-26 NOTE — Progress Notes (Signed)
Physical Therapy Treatment Patient Details Name: Joseph [name] MRN: [id number] DOB: [birth year] Today's Date: 12/26/2017    History of Present Illness 67 year old gentleman admitted with acute on chronic respiratory failure secondary to severe bullous emphysema with infection. Patient also has bronchiectasis and chronic aspiration pneumonia.  He is chronically on oxygen at home at 3 L.  Patient was initially in the ICU under the care of CCM but now transferred to the floor. He has severe rheumatoid arthritis followed at [facility].  Currently started on methotrexate recently.  Patient has pancytopenia and therefore immunocompromised    PT Comments    Patient performed well ambulating 350 feet again today using platform rolling walker/gait trainer and assistance of 2 for safety. Transferred from elevated bed with min guard and independent bed mobility. Patient suffers from long standing rheumatoid arthritis which impairs their ability to perform daily activities like ADLs, IADLs and ambulation in the home. Due to patient's RA symptoms and significantly windswept fingers, he is unable to push a manual wheelchair and is dependent upon a caregiver to push him in the home or use his legs to propel in the home.  A walker alone will not resolve the issues with performing activities of daily living. A transport chair will allow patient to safely perform daily activities.  The patient can propel with his legs and has a caregiver who can provide assistance.  Pt admitted with above diagnosis. Pt currently with functional limitations due to the deficits listed below (see PT Problem List). Pt will benefit from skilled PT to increase their independence and safety with mobility to allow discharge to the venue listed below.       Follow Up Recommendations  Supervision/Assistance - 24 hour;Home health PT (patient has received HHPT prior including pulmonary PT)     Equipment Recommendations  Other  (comment)(Patient's current transport chair is 66 years old; current bil platforms & walker w/ front wheels is 67 years old. Patient may need to triage which DME most needed at this time. Possible replace walker w/ wheels and transport chair only & not platforms)    Recommendations for Other Services       Precautions / Restrictions Precautions Precautions: Fall Restrictions Weight Bearing Restrictions: No    Mobility  Bed Mobility Overal bed mobility: Modified Independent                Transfers Overall transfer level: Needs assistance Equipment used: Ambulation equipment usedFinancial risk analyst) Transfers: Sit to/from [employer] Transfers Sit to Stand: Min guard(HOB elevated) Stand pivot transfers: Min guard;Min assist       General transfer comment: min assist for lines/brakes  Ambulation/Gait Ambulation/Gait assistance: Min guard;Min assist(min assist for lines/brakes) Ambulation Distance (Feet): 350 Feet Assistive device: (platform 4-wheeled walker/gait trainer) Gait Pattern/deviations: Step-through pattern;Trunk flexed     General Gait Details: patient wears bilateral diabetic shoes with additional padding due to peripheral neuropathy.   Stairs             Wheelchair Mobility    Modified Rankin (Stroke Patients Only)       Balance Overall balance assessment: Needs assistance Sitting-balance support: No upper extremity supported Sitting balance-Leahy Scale: Good     Standing balance support: Bilateral upper extremity supported;During functional activity Standing balance-Leahy Scale: Fair                              Cognition Arousal/Alertness: Awake/alert Behavior During  Therapy: WFL for tasks assessed/performed Overall Cognitive Status: Within Functional Limits for tasks assessed                                        Exercises      General Comments        Pertinent  Vitals/Pain Pain Score: 2  Pain Descriptors / Indicators: Aching Pain Intervention(s): Monitored during session    Home Living                      Prior Function            PT Goals (current goals can now be found in the care plan section) Progress towards PT goals: Progressing toward goals    Frequency    Min 3X/week      PT Plan Equipment recommendations need to be updated    Co-evaluation              AM-PAC PT "6 Clicks" Daily Activity  Outcome Measure  Difficulty turning over in bed (including adjusting bedclothes, sheets and blankets)?: None Difficulty moving from lying on back to sitting on the side of the bed? : A Little Difficulty sitting down on and standing up from a chair with arms (e.g., wheelchair, bedside commode, etc,.)?: A Little Help needed moving to and from a bed to chair (including a wheelchair)?: A Little Help needed walking in hospital room?: A Little Help needed climbing 3-5 steps with a railing? : A Lot 6 Click Score: 18    End of Session Equipment Utilized During Treatment: Gait belt(during ambulation, wife followed with recliner) Activity Tolerance: Patient tolerated treatment well Patient left: with family/visitor present;in chair Nurse Communication: Mobility status PT Visit Diagnosis: Unsteadiness on feet (R26.81);Muscle weakness (generalized) (M62.81);Difficulty in walking, not elsewhere classified (R26.2)     Time: 0923-3007 PT Time Calculation (min) (ACUTE ONLY): 33 min  Charges:  $Gait Training: 8-22 mins $Therapeutic Activity: 8-22 mins                    G Codes:       Sonja Manseau D. Hartnett-Rands, MS, PT [facility] [phone] 12/26/2017, 1:33 PM

## 2017-12-26 NOTE — Progress Notes (Signed)
Patient had 4 beat run of Vtach. Md notified. Patient is in no acute distress. Will continue to monitor.

## 2017-12-26 NOTE — Progress Notes (Addendum)
Date: Dec 26, 2017 Chart review for discharge needs:  PT ORDERED FOR HOME SENT TO [facility] Patient has no questions concerning post hospital care. Marcelle Smiling, BSN, [place], [place] [phone]

## 2017-12-27 ENCOUNTER — Telehealth: Payer: Self-pay | Admitting: Pulmonary Disease

## 2017-12-27 ENCOUNTER — Inpatient Hospital Stay (HOSPITAL_COMMUNITY)
Admission: EM | Admit: 2017-12-27 | Discharge: 2018-01-09 | DRG: 393 | Disposition: A | Discharge status: Home / Self Care | Payer: Medicare HMO | Attending: Internal Medicine | Admitting: Internal Medicine

## 2017-12-27 ENCOUNTER — Encounter (HOSPITAL_COMMUNITY): Payer: Self-pay | Admitting: Emergency Medicine

## 2017-12-27 ENCOUNTER — Emergency Department (HOSPITAL_COMMUNITY): Payer: Medicare HMO

## 2017-12-27 DIAGNOSIS — I2510 Atherosclerotic heart disease of native coronary artery without angina pectoris: Secondary | ICD-10-CM | POA: Diagnosis not present

## 2017-12-27 DIAGNOSIS — K3184 Gastroparesis: Secondary | ICD-10-CM | POA: Diagnosis present

## 2017-12-27 DIAGNOSIS — E1142 Type 2 diabetes mellitus with diabetic polyneuropathy: Secondary | ICD-10-CM | POA: Diagnosis not present

## 2017-12-27 DIAGNOSIS — J9621 Acute and chronic respiratory failure with hypoxia: Secondary | ICD-10-CM | POA: Diagnosis not present

## 2017-12-27 DIAGNOSIS — Z9841 Cataract extraction status, right eye: Secondary | ICD-10-CM

## 2017-12-27 DIAGNOSIS — E1140 Type 2 diabetes mellitus with diabetic neuropathy, unspecified: Secondary | ICD-10-CM | POA: Diagnosis not present

## 2017-12-27 DIAGNOSIS — J449 Chronic obstructive pulmonary disease, unspecified: Secondary | ICD-10-CM | POA: Diagnosis not present

## 2017-12-27 DIAGNOSIS — M0579 Rheumatoid arthritis with rheumatoid factor of multiple sites without organ or systems involvement: Secondary | ICD-10-CM | POA: Diagnosis not present

## 2017-12-27 DIAGNOSIS — J189 Pneumonia, unspecified organism: Secondary | ICD-10-CM | POA: Diagnosis not present

## 2017-12-27 DIAGNOSIS — D638 Anemia in other chronic diseases classified elsewhere: Secondary | ICD-10-CM | POA: Diagnosis present

## 2017-12-27 DIAGNOSIS — M810 Age-related osteoporosis without current pathological fracture: Secondary | ICD-10-CM | POA: Diagnosis present

## 2017-12-27 DIAGNOSIS — Y92009 Unspecified place in unspecified non-institutional (private) residence as the place of occurrence of the external cause: Secondary | ICD-10-CM

## 2017-12-27 DIAGNOSIS — R197 Diarrhea, unspecified: Secondary | ICD-10-CM

## 2017-12-27 DIAGNOSIS — J849 Interstitial pulmonary disease, unspecified: Secondary | ICD-10-CM | POA: Diagnosis not present

## 2017-12-27 DIAGNOSIS — K521 Toxic gastroenteritis and colitis: Principal | ICD-10-CM | POA: Diagnosis present

## 2017-12-27 DIAGNOSIS — L03119 Cellulitis of unspecified part of limb: Secondary | ICD-10-CM | POA: Diagnosis not present

## 2017-12-27 DIAGNOSIS — E860 Dehydration: Secondary | ICD-10-CM | POA: Diagnosis not present

## 2017-12-27 DIAGNOSIS — M25552 Pain in left hip: Secondary | ICD-10-CM | POA: Diagnosis present

## 2017-12-27 DIAGNOSIS — K760 Fatty (change of) liver, not elsewhere classified: Secondary | ICD-10-CM | POA: Diagnosis present

## 2017-12-27 DIAGNOSIS — I110 Hypertensive heart disease with heart failure: Secondary | ICD-10-CM | POA: Diagnosis not present

## 2017-12-27 DIAGNOSIS — Z880 Allergy status to penicillin: Secondary | ICD-10-CM

## 2017-12-27 DIAGNOSIS — A419 Sepsis, unspecified organism: Secondary | ICD-10-CM | POA: Diagnosis not present

## 2017-12-27 DIAGNOSIS — I10 Essential (primary) hypertension: Secondary | ICD-10-CM | POA: Diagnosis present

## 2017-12-27 DIAGNOSIS — E1143 Type 2 diabetes mellitus with diabetic autonomic (poly)neuropathy: Secondary | ICD-10-CM | POA: Diagnosis present

## 2017-12-27 DIAGNOSIS — R6510 Systemic inflammatory response syndrome (SIRS) of non-infectious origin without acute organ dysfunction: Secondary | ICD-10-CM | POA: Diagnosis present

## 2017-12-27 DIAGNOSIS — M25559 Pain in unspecified hip: Secondary | ICD-10-CM

## 2017-12-27 DIAGNOSIS — K219 Gastro-esophageal reflux disease without esophagitis: Secondary | ICD-10-CM | POA: Diagnosis present

## 2017-12-27 DIAGNOSIS — E785 Hyperlipidemia, unspecified: Secondary | ICD-10-CM | POA: Diagnosis present

## 2017-12-27 DIAGNOSIS — M069 Rheumatoid arthritis, unspecified: Secondary | ICD-10-CM | POA: Diagnosis present

## 2017-12-27 DIAGNOSIS — E7849 Other hyperlipidemia: Secondary | ICD-10-CM | POA: Diagnosis not present

## 2017-12-27 DIAGNOSIS — J438 Other emphysema: Secondary | ICD-10-CM | POA: Diagnosis not present

## 2017-12-27 DIAGNOSIS — Z79899 Other long term (current) drug therapy: Secondary | ICD-10-CM

## 2017-12-27 DIAGNOSIS — E876 Hypokalemia: Secondary | ICD-10-CM | POA: Diagnosis present

## 2017-12-27 DIAGNOSIS — Z951 Presence of aortocoronary bypass graft: Secondary | ICD-10-CM

## 2017-12-27 DIAGNOSIS — M059 Rheumatoid arthritis with rheumatoid factor, unspecified: Secondary | ICD-10-CM | POA: Diagnosis present

## 2017-12-27 DIAGNOSIS — M160 Bilateral primary osteoarthritis of hip: Secondary | ICD-10-CM | POA: Diagnosis not present

## 2017-12-27 DIAGNOSIS — J479 Bronchiectasis, uncomplicated: Secondary | ICD-10-CM | POA: Diagnosis not present

## 2017-12-27 DIAGNOSIS — J9611 Chronic respiratory failure with hypoxia: Secondary | ICD-10-CM | POA: Diagnosis present

## 2017-12-27 DIAGNOSIS — R000 Tachycardia, unspecified: Secondary | ICD-10-CM | POA: Diagnosis not present

## 2017-12-27 DIAGNOSIS — Z6827 Body mass index (BMI) 27.0-27.9, adult: Secondary | ICD-10-CM | POA: Diagnosis not present

## 2017-12-27 DIAGNOSIS — I5032 Chronic diastolic (congestive) heart failure: Secondary | ICD-10-CM

## 2017-12-27 DIAGNOSIS — M0500 Felty's syndrome, unspecified site: Secondary | ICD-10-CM | POA: Diagnosis present

## 2017-12-27 DIAGNOSIS — Z7952 Long term (current) use of systemic steroids: Secondary | ICD-10-CM

## 2017-12-27 DIAGNOSIS — J984 Other disorders of lung: Secondary | ICD-10-CM | POA: Diagnosis present

## 2017-12-27 DIAGNOSIS — D709 Neutropenia, unspecified: Secondary | ICD-10-CM | POA: Diagnosis not present

## 2017-12-27 DIAGNOSIS — Z8249 Family history of ischemic heart disease and other diseases of the circulatory system: Secondary | ICD-10-CM

## 2017-12-27 DIAGNOSIS — Y95 Nosocomial condition: Secondary | ICD-10-CM

## 2017-12-27 DIAGNOSIS — Z9842 Cataract extraction status, left eye: Secondary | ICD-10-CM

## 2017-12-27 DIAGNOSIS — J439 Emphysema, unspecified: Secondary | ICD-10-CM | POA: Diagnosis not present

## 2017-12-27 DIAGNOSIS — Z87891 Personal history of nicotine dependence: Secondary | ICD-10-CM

## 2017-12-27 DIAGNOSIS — Z9981 Dependence on supplemental oxygen: Secondary | ICD-10-CM

## 2017-12-27 DIAGNOSIS — M05712 Rheumatoid arthritis with rheumatoid factor of left shoulder without organ or systems involvement: Secondary | ICD-10-CM | POA: Diagnosis not present

## 2017-12-27 DIAGNOSIS — Z9049 Acquired absence of other specified parts of digestive tract: Secondary | ICD-10-CM

## 2017-12-27 DIAGNOSIS — M25551 Pain in right hip: Secondary | ICD-10-CM | POA: Diagnosis present

## 2017-12-27 DIAGNOSIS — Z7982 Long term (current) use of aspirin: Secondary | ICD-10-CM

## 2017-12-27 DIAGNOSIS — J471 Bronchiectasis with (acute) exacerbation: Secondary | ICD-10-CM | POA: Diagnosis not present

## 2017-12-27 DIAGNOSIS — R509 Fever, unspecified: Secondary | ICD-10-CM | POA: Diagnosis not present

## 2017-12-27 DIAGNOSIS — M05711 Rheumatoid arthritis with rheumatoid factor of right shoulder without organ or systems involvement: Secondary | ICD-10-CM | POA: Diagnosis not present

## 2017-12-27 DIAGNOSIS — I252 Old myocardial infarction: Secondary | ICD-10-CM

## 2017-12-27 DIAGNOSIS — T3695XA Adverse effect of unspecified systemic antibiotic, initial encounter: Secondary | ICD-10-CM | POA: Diagnosis present

## 2017-12-27 DIAGNOSIS — Z89429 Acquired absence of other toe(s), unspecified side: Secondary | ICD-10-CM

## 2017-12-27 LAB — CBC WITH DIFFERENTIAL/PLATELET
Basophils Absolute: 0 10*3/uL (ref 0.0–0.1)
Basophils Relative: 0 %
Eosinophils Absolute: 0 10*3/uL (ref 0.0–0.7)
Eosinophils Relative: 0 %
HEMATOCRIT: 39 % (ref 39.0–52.0)
HEMOGLOBIN: 13.1 g/dL (ref 13.0–17.0)
LYMPHS ABS: 2.4 10*3/uL (ref 0.7–4.0)
Lymphocytes Relative: 53 %
MCH: 28.6 pg (ref 26.0–34.0)
MCHC: 33.6 g/dL (ref 30.0–36.0)
MCV: 85.2 fL (ref 78.0–100.0)
MONOS PCT: 19 %
Monocytes Absolute: 0.9 10*3/uL (ref 0.1–1.0)
NEUTROS ABS: 1.3 10*3/uL — AB (ref 1.7–7.7)
NEUTROS PCT: 28 %
Platelets: 183 10*3/uL (ref 150–400)
RBC: 4.58 MIL/uL (ref 4.22–5.81)
RDW: 16.2 % — ABNORMAL HIGH (ref 11.5–15.5)
WBC: 4.5 10*3/uL (ref 4.0–10.5)

## 2017-12-27 LAB — URINALYSIS, ROUTINE W REFLEX MICROSCOPIC
Bilirubin Urine: NEGATIVE
GLUCOSE, UA: NEGATIVE mg/dL
KETONES UR: 5 mg/dL — AB
Leukocytes, UA: NEGATIVE
Nitrite: NEGATIVE
Protein, ur: NEGATIVE mg/dL
RBC / HPF: >50 RBC/hpf — ABNORMAL HIGH (ref 0–5)
Specific Gravity, Urine: 1.016 (ref 1.005–1.030)
pH: 6 (ref 5.0–8.0)

## 2017-12-27 LAB — COMPREHENSIVE METABOLIC PANEL
ALBUMIN: 3.1 g/dL — AB (ref 3.5–5.0)
ALT: 37 U/L (ref 17–63)
ANION GAP: 12 (ref 5–15)
AST: 32 U/L (ref 15–41)
Alkaline Phosphatase: 76 U/L (ref 38–126)
BUN: 10 mg/dL (ref 6–20)
CO2: 20 mmol/L — AB (ref 22–32)
Calcium: 9.2 mg/dL (ref 8.9–10.3)
Chloride: 103 mmol/L (ref 101–111)
Creatinine, Ser: 0.8 mg/dL (ref 0.61–1.24)
GFR calc non Af Amer: >60 mL/min (ref >60)
Glucose, Bld: 147 mg/dL — ABNORMAL HIGH (ref 65–99)
Potassium: 3.2 mmol/L — ABNORMAL LOW (ref 3.5–5.1)
Sodium: 135 mmol/L (ref 135–145)
Total Bilirubin: 1.1 mg/dL (ref 0.3–1.2)
Total Protein: 7.2 g/dL (ref 6.5–8.1)

## 2017-12-27 LAB — I-STAT CG4 LACTIC ACID, ED: Lactic Acid, Venous: 1.58 mmol/L (ref 0.5–1.9)

## 2017-12-27 LAB — CULTURE, BLOOD (ROUTINE X 2)
Culture: NO GROWTH
Culture: NO GROWTH
SPECIAL REQUESTS: ADEQUATE
Special Requests: ADEQUATE

## 2017-12-27 LAB — C DIFFICILE QUICK SCREEN W PCR REFLEX
C DIFFICILE (CDIFF) INTERP: NOT DETECTED
C Diff antigen: NEGATIVE
C Diff toxin: NEGATIVE

## 2017-12-27 MED ORDER — SODIUM CHLORIDE 0.9 % IV SOLN
INTRAVENOUS | Status: DC
  Administered 2017-12-28 – 2017-12-30 (×6): via INTRAVENOUS

## 2017-12-27 MED ORDER — METRONIDAZOLE IN NACL 5-0.79 MG/ML-% IV SOLN
500.0000 mg | Freq: Once | INTRAVENOUS | Status: AC
  Administered 2017-12-27: 500 mg via INTRAVENOUS
  Filled 2017-12-27: qty 100

## 2017-12-27 MED ORDER — ACETAMINOPHEN 500 MG PO TABS: 1000.0000 mg | ORAL_TABLET | Freq: Once | ORAL | Status: DC

## 2017-12-27 MED ORDER — SODIUM CHLORIDE 0.9 % IV BOLUS
1000.0000 mL | Freq: Once | INTRAVENOUS | Status: AC
  Administered 2017-12-27: 1000 mL via INTRAVENOUS

## 2017-12-27 MED ORDER — SODIUM CHLORIDE 0.9 % IV BOLUS
2000.0000 mL | Freq: Once | INTRAVENOUS | Status: AC
  Administered 2017-12-27: 2000 mL via INTRAVENOUS

## 2017-12-27 MED ORDER — LACTATED RINGERS IV BOLUS
500.0000 mL | Freq: Once | INTRAVENOUS | Status: AC
  Administered 2017-12-27: 500 mL via INTRAVENOUS

## 2017-12-27 MED ORDER — HYDROCORTISONE NA SUCCINATE PF 100 MG IJ SOLR
100.0000 mg | Freq: Once | INTRAMUSCULAR | Status: AC
  Administered 2017-12-28: 100 mg via INTRAVENOUS
  Filled 2017-12-27: qty 2

## 2017-12-27 MED ORDER — VANCOMYCIN HCL IN DEXTROSE 1-5 GM/200ML-% IV SOLN
1000.0000 mg | Freq: Three times a day (TID) | INTRAVENOUS | Status: DC
  Administered 2017-12-27 – 2017-12-29 (×5): 1000 mg via INTRAVENOUS
  Filled 2017-12-27 (×8): qty 200

## 2017-12-27 MED ORDER — SODIUM CHLORIDE 0.9 % IV SOLN
2.0000 g | Freq: Two times a day (BID) | INTRAVENOUS | Status: DC
  Administered 2017-12-27 – 2017-12-28 (×2): 2 g via INTRAVENOUS
  Filled 2017-12-27 (×2): qty 2

## 2017-12-27 MED ORDER — FENTANYL CITRATE (PF) 100 MCG/2ML IJ SOLN
50.0000 ug | Freq: Once | INTRAMUSCULAR | Status: AC
  Administered 2017-12-27: 50 ug via INTRAVENOUS
  Filled 2017-12-27: qty 2

## 2017-12-27 MED ORDER — ONDANSETRON HCL 4 MG/2ML IJ SOLN
4.0000 mg | INTRAMUSCULAR | Status: AC
  Administered 2017-12-27: 4 mg via INTRAVENOUS
  Filled 2017-12-27: qty 2

## 2017-12-27 NOTE — ED Notes (Signed)
Pt has had approximately 6 episodes of diarrhea since 1900.  Incontinence cleanser and barrier cream applied.

## 2017-12-27 NOTE — ED Triage Notes (Signed)
BIB EMS for further eval of diarrhea, fever and general.ized weakness; h/o C-Diff; febrile today at 102.0; given tylenol per EMS PTA

## 2017-12-27 NOTE — Telephone Encounter (Signed)
Called by wife for explosive diarrhea that started the day of admission.  He has been taking amoxicillin-clavulanate at home.  Recently discharged after treatment for HCAP.  He has been dealing with pancytopenia prior. He has a fever today 101.5.   I advised that he come back to the ER given concern for C diff.    Heber Fairborn, MD [facility] Pager: [phone] Cell: [phone] After 3pm or if no response, call [phone]

## 2017-12-27 NOTE — H&P (Signed)
History and Physical    Joseph [name]:[id number] DOB: [birth year] DOA: 12/27/2017  Referring MD/NP/PA:   PCP: Lennart Pall, MD   Patient coming from:  The patient is coming from home.  At baseline, pt is independent for most of ADL.   Chief Complaint: Diarrhea and fever  HPI: Joseph [name] is a 67 y.o. male with medical history significant of hypertension, hyperlipidemia, COPD, chronic respiratory failure on 3 L nasal cannula oxygen, GERD, CAD, gastroparesis, psoriasis, rheumatoid arthritis, pancytopenia, dCHF, who presents with diarrhea and fever.  Patient was hospitalized from 4/29-5/3 due to HCAP. He was discharged home on Augmentin yesterday.  Patient states that he developed severe diarrhea today.  He has had more 20 times of watery bowel movements.  No nausea, vomiting or abdominal pain.  He also has fever of 102 and chills.  Patient states that he has mild cough and shortness of breath which is at the baseline.  He also has pleuritic chest pain, which is constant, 6 out of 10 severity, nonradiating.  Denies symptoms of UTI or unilateral weakness.  ED Course: pt was found to have WBC 4.5, lactic acid of 1.58, negative urinalysis, negative C. difficile antigen toxin, temperature 102.8, hypotensive with blood pressure 87/59 which improved to 120/60 3:07 liters normal saline bolus, tachypnea, oxygen saturation 100% on 3 L nasal cannula oxygen.  Chest x-ray showed known left upper lobe thick-walled cavitary lesion with increased size and increased surrounding airspace consolidation.  Patient is admitted to stepdown as inpatient.   Review of Systems:   General: has fevers, chills, no body weight gain, has poor appetite, has fatigue HEENT: no blurry vision, hearing changes or sore throat Respiratory: has dyspnea, coughing, no wheezing CV: has chest pain, no palpitations GI: no nausea, vomiting, abdominal pain, constipation, has diarrhea GU: no dysuria, burning on urination,  increased urinary frequency, hematuria  Ext: no leg edema Neuro: no unilateral weakness, numbness, or tingling, no vision change or hearing loss Skin: no rash, no skin tear. MSK: No muscle spasm, no deformity, no limitation of range of movement in spin Heme: No easy bruising.  Travel history: No recent long distant travel.  Allergy: No Known Allergies  Past Medical History:  Diagnosis Date  . CAD (coronary artery disease)    MI 2007, CABG  . Cataracts, bilateral   . Complication of anesthesia    DIFFICULTY AFTER CABG HAD CONFUSION POST OP  . COPD (chronic obstructive pulmonary disease) (HCC)   . Diabetes mellitus    Dx 07-2008 A1C 6.2  . Esophageal dysmotility   . Fatty liver 10/11/09  . Gastroesophageal reflux disease   . Gastroparesis   . Hiatal hernia   . Hypertension   . Neuropathy    (related to RA per neuro note 05-2011)  . Onychomycosis   . Osteoporosis    per DEXA 05-2008  . Oxygen dependent    3l  . Peripheral polyneuropathy    Severe, causing problems with his feet, see surgeries  . Psoriasis   . Pulmonary nodules   . Rheumatoid arthritis (HCC)    [facility]  . Septic shock (HCC) 07/2017    Past Surgical History:  Procedure Laterality Date  . A-FLUTTER ABLATION N/A 11/27/2016   Procedure: A-Flutter Ablation;  Surgeon: Marinus Maw, MD;  Location: [facility] INVASIVE CV LAB;  Service: Cardiovascular;  Laterality: N/A;  . achiles tendon surgery  8/09  . CARDIAC CATHETERIZATION N/A 01/23/2016   Procedure: Left Heart Cath and Cors/Grafts Angiography;  Surgeon: Lyn Records, MD;  Location: [facility] INVASIVE CV LAB;  Service: Cardiovascular;  Laterality: N/A;  . CARDIOVERSION N/A 10/23/2016   Procedure: CARDIOVERSION;  Surgeon: Rollene Rotunda, MD;  Location: [facility] ENDOSCOPY;  Service: Cardiovascular;  Laterality: N/A;  . CATARACT EXTRACTION Right 03/2017  . CATARACT EXTRACTION Left 05/2017  . CHOLECYSTECTOMY    . CORONARY ARTERY BYPASS GRAFT  2007   (LIMA to the LAD,  left radial to obtuse marginal, SVG to first diagonal, SVG to PDA. His ejection fraction to 50-55%  . ESOPHAGOGASTRODUODENOSCOPY N/A 10/19/2012   Procedure: ESOPHAGOGASTRODUODENOSCOPY (EGD);  Surgeon: Hart Carwin, MD;  Location: [facility] ENDOSCOPY;  Service: Endoscopy;  Laterality: N/A;  the dilatation is possible.   Marland Kitchen FOOT SURGERY     to tendon release and repair of osteomyelitis   . LARYNGOSCOPY N/A 01/21/2014   Procedure: LARYNGOSCOPY;  Surgeon: Christia Reading, MD;  Location: [facility] OR;  Service: ENT;  Laterality: N/A;  micro direct laryngoscopy with prolaryn injection/jet venturi ventilation  . TOE AMPUTATION  4-12/ 3 14   d/t a deformity, found to have osteomyelitis, complicated by post-op foot strtess FX    Social History:  reports that he quit smoking about 12 years ago. He has a 40.00 pack-year smoking history. He quit smokeless tobacco use about 11 years ago. He reports that he does not drink alcohol or use drugs.  Family History:  Family History  Problem Relation Age of Onset  . Heart disease Mother   . Diverticulitis Mother   . Heart disease Father   . Heart attack Father        x2  . Colon cancer Neg Hx   . Prostate cancer Neg Hx      Prior to Admission medications   Medication Sig Start Date End Date Taking? Authorizing Provider  albuterol (PROAIR HFA) 108 (90 Base) MCG/ACT inhaler Inhale 2 puffs into the lungs every 4 (four) hours as needed for wheezing or shortness of breath. 12/01/17  Yes [name], MD  amoxicillin-clavulanate (AUGMENTIN) 875-125 MG tablet Take 1 tablet by mouth every 12 (twelve) hours for 10 days. 12/26/17 01/05/18 Yes Calvert Cantor, MD  Ascorbic Acid (VITAMIN C) 500 MG tablet Take 500 mg by mouth daily.     Yes [provider]  aspirin EC 81 MG tablet Take 81 mg by mouth daily.   Yes [provider]  atorvastatin (LIPITOR) 80 MG tablet TAKE 1 TABLET (80mg ) BY MOUTH DAILY Patient taking differently: Take 80 mg by mouth at bedtime.  12/15/17  Yes  Rollene Rotunda, MD  B Complex-C (SUPER B COMPLEX PO) Take 1 tablet by mouth daily.    Yes [provider]  Carbonyl Iron (PERFECT IRON PO) Take 50 mg by mouth every morning.    Yes [provider]  clobetasol cream (TEMOVATE) 0.05 % Apply 1 application topically 2 (two) times daily as needed (rash).    Yes [provider]  dextromethorphan-guaiFENesin (MUCINEX DM) 30-600 MG 12hr tablet Take 1 tablet by mouth 2 (two) times daily. Patient taking differently: Take 1 tablet by mouth at bedtime.  08/22/17  Yes Drema Dallas, MD  folic acid (FOLVITE) 800 MCG tablet Take 800 mcg by mouth daily.   Yes [provider]  furosemide (LASIX) 20 MG tablet Take 3 tablets (60 mg total) by mouth daily. Patient taking differently: Take 60 mg by mouth daily. Take 40 mg in the morning and 20mg  at 3pm 12/15/17  Yes Rollene Rotunda, MD  hydrocortisone cream  1 % Apply 1 application topically 3 (three) times daily as needed for itching.   Yes [provider]  ipratropium-albuterol (DUONEB) 0.5-2.5 (3) MG/3ML SOLN Take 3 mLs by nebulization every 4 (four) hours as needed (shortness of breath or wheezing). 12/01/17  Yes [name], MD  Loteprednol Etabonate (LOTEMAX) 0.5 % GEL Place 1 drop into both eyes every morning. daily 07/01/14  Yes [provider]  Melatonin 1 MG CAPS Take 4 mg by mouth as needed (Sleep).   Yes [provider]  Multiple Vitamins-Minerals (VITEYES AREDS ADVANCED PO) Take 1 tablet by mouth daily.   Yes [provider]  nitroGLYCERIN (NITROSTAT) 0.4 MG SL tablet Place 1 tablet (0.4 mg total) under the tongue every 5 (five) minutes as needed for chest pain (up to 3 doses for severe chest pain. If no relief after 3rd dose, proceed to the ED for an evaluation). 10/04/16  Yes Rollene Rotunda, MD  OVER THE COUNTER MEDICATION Apply topically as needed. T-Gel Shampoo   Yes [provider]  oxyCODONE-acetaminophen (PERCOCET) 7.5-325  MG per tablet Take 1 tablet by mouth every 6 (six) hours as needed for moderate pain.    Yes [provider]  OXYGEN Inhale 3 L into the lungs continuous.   Yes [provider]  pantoprazole (PROTONIX) 40 MG tablet Take 1 tablet (40 mg total) by mouth 2 (two) times daily. 05/16/17  Yes Armbruster, Willaim Rayas, MD  polyethylene glycol (MIRALAX / GLYCOLAX) packet Take 17 g by mouth daily. 12/27/17  Yes Rizwan, Ladell Heads, MD  potassium chloride (KLOR-CON 10) 10 MEQ tablet TAKE 4 TABLETS ( ) BY MOUTH EVERY MORNING AND TAKE 2 TABLETS ( ) EVERY EVENING Patient taking differently: TAKE 4 TABLETS ( ) BY MOUTH EVERY MORNING AND TAKE 2 TABLETS ( ) 2pm, take along with furosemide 12/15/17  Yes Hochrein, Fayrene Fearing, MD  predniSONE (DELTASONE) 20 MG tablet Take 15 mg by mouth daily with breakfast.    Yes [provider]  pregabalin (LYRICA) 75 MG capsule Take 75 mg by mouth 2 (two) times daily.   Yes [provider]  sodium chloride (OCEAN) 0.65 % SOLN nasal spray Place 1 spray into both nostrils as needed for congestion.   Yes [provider]  TRELEGY ELLIPTA 100-62.5-25 MCG/INH AEPB Inhale 1 puff into the lungs daily. 12/01/17  Yes [name], MD    Physical Exam: Vitals:   12/27/17 2330 12/28/17 0055 12/28/17 0100 12/28/17 0130  BP: 124/71  105/76 108/68  Pulse: 93  (!) 110 (!) 112  Resp: (!) 21  20 (!) 22  Temp:  (!) 102 F (38.9 C)    TempSrc:  Rectal    SpO2: 100%  98% 96%  Weight:      Height:       General: Not in acute distress HEENT:       Eyes: PERRL, EOMI, no scleral icterus.       ENT: No discharge from the ears and nose, no pharynx injection, no tonsillar enlargement.        Neck: No JVD, no bruit, no mass felt. Heme: No neck lymph node enlargement. Cardiac: S1/S2, RRR, No murmurs, No gallops or rubs. Respiratory: No rales, wheezing, rhonchi or rubs. GI: Soft, nondistended, nontender, no rebound pain, no organomegaly, BS present. GU: No  hematuria Ext: No pitting leg edema bilaterally. 2+DP/PT pulse bilaterally. Musculoskeletal: No joint deformities, No joint redness or warmth, no limitation of ROM in spin. Skin: No rashes.  Neuro: Alert, oriented X3, cranial nerves  II-XII grossly intact, moves all extremities normally. Psych: Patient is not psychotic, no suicidal or hemocidal ideation.  Labs on Admission: I have personally reviewed following labs and imaging studies  CBC: Recent Labs  Lab 12/22/17 1046  12/24/17 0613 12/24/17 1000 12/25/17 0608 12/26/17 0612 12/27/17 1721  WBC 4.4   < > 2.6* 1.7* 2.1* 3.0* 4.5  NEUTROABS 0.8*  --   --  0.5* 0.6* 1.1* 1.3*  HGB 13.9   < > 11.0* 11.0* 11.2* 11.2* 13.1  HCT 39.5   < > 33.7* 32.8* 34.3* 33.7* 39.0  MCV 85.1   < > 86.9 86.1 87.3 86.9 85.2  PLT 147*   < > 122* 113* 131* 133* 183   < > = values in this interval not displayed.   Basic Metabolic Panel: Recent Labs  Lab 12/23/17 0329 12/24/17 0613 12/25/17 0608 12/26/17 0612 12/27/17 1721  NA 136 137 142 140 135  K 3.4* 3.4* 3.4* 3.5 3.2*  CL 106 104 108 107 103  CO2 20* 24 25 23  20*  GLUCOSE 118* 121* 137* 100* 147*  BUN 11 13 15 14 10   CREATININE 0.60* 0.51* 0.52* 0.51* 0.80  CALCIUM 8.5* 8.3* 8.5* 9.0 9.2   GFR: Estimated Creatinine Clearance: 107 mL/min (by C-G formula based on SCr of 0.8 mg/dL). Liver Function Tests: Recent Labs  Lab 12/22/17 1046 12/27/17 1721  AST 68* 32  ALT 66* 37  ALKPHOS 75 76  BILITOT 1.8* 1.1  PROT 6.8 7.2  ALBUMIN 3.4* 3.1*   No results for input(s): LIPASE, AMYLASE in the last 168 hours. No results for input(s): AMMONIA in the last 168 hours. Coagulation Profile: No results for input(s): INR, PROTIME in the last 168 hours. Cardiac Enzymes: No results for input(s): CKTOTAL, CKMB, CKMBINDEX, TROPONINI in the last 168 hours. BNP (last 3 results) No results for input(s): PROBNP in the last 8760 hours. HbA1C: No results for input(s): HGBA1C in the last 72  hours. CBG: Recent Labs  Lab 12/25/17 1114 12/25/17 1655 12/25/17 2101 12/26/17 0727 12/26/17 1214  GLUCAP 195* 174* 141* 95 120*   Lipid Profile: No results for input(s): CHOL, HDL, LDLCALC, TRIG, CHOLHDL, LDLDIRECT in the last 72 hours. Thyroid Function Tests: No results for input(s): TSH, T4TOTAL, FREET4, T3FREE, THYROIDAB in the last 72 hours. Anemia Panel: No results for input(s): VITAMINB12, FOLATE, FERRITIN, TIBC, IRON, RETICCTPCT in the last 72 hours. Urine analysis:    Component Value Date/Time   COLORURINE YELLOW 12/27/2017 2115   APPEARANCEUR CLEAR 12/27/2017 2115   LABSPEC 1.016 12/27/2017 2115   PHURINE 6.0 12/27/2017 2115   GLUCOSEU NEGATIVE 12/27/2017 2115   HGBUR LARGE (A) 12/27/2017 2115   HGBUR negative 04/12/2008 1103   BILIRUBINUR NEGATIVE 12/27/2017 2115   KETONESUR 5 (A) 12/27/2017 2115   PROTEINUR NEGATIVE 12/27/2017 2115   UROBILINOGEN 0.2 10/17/2012 1236   NITRITE NEGATIVE 12/27/2017 2115   LEUKOCYTESUR NEGATIVE 12/27/2017 2115   Sepsis Labs: @LABRCNTIP (procalcitonin:4,lacticidven:4) ) Recent Results (from the past 240 hour(s))  Blood Culture (routine x 2)     Status: None   Collection Time: 12/22/17 10:46 AM  Result Value Ref Range Status   Specimen Description   Final    BLOOD LEFT ARM Performed at [facility], [address] [place], [address] [facility] [facility]    Special Requests   Final    BOTTLES DRAWN AEROBIC AND ANAEROBIC Blood Culture adequate volume Performed at [facility] [address] [place], [facility] [address]  Culture   Final    NO GROWTH 5 DAYS Performed at [facility], [address], [place], [place] [address]    Report Status 12/27/2017 FINAL  Final  Blood Culture (routine x 2)     Status: None   Collection Time: 12/22/17 10:55 AM  Result Value Ref Range Status   Specimen Description   Final    BLOOD LEFT HAND Performed at [facility], [address] [place], [place]  [address]    Special Requests   Final    BOTTLES DRAWN AEROBIC AND ANAEROBIC Blood Culture adequate volume Performed at [facility], [address] [place], [place] [address]    Culture   Final    NO GROWTH 5 DAYS Performed at [facility], [address] [place], [place] [address]    Report Status 12/27/2017 FINAL  Final  MRSA PCR Screening     Status: None   Collection Time: 12/22/17  8:41 PM  Result Value Ref Range Status   MRSA by PCR NEGATIVE NEGATIVE Final    Comment:        The GeneXpert MRSA Assay (FDA approved for NASAL specimens only), is one component of a comprehensive MRSA colonization surveillance program. It is not intended to diagnose MRSA infection nor to guide or monitor treatment for MRSA infections. Performed at [facility], [address] [place], [place] [address]   Culture, sputum-assessment     Status: None   Collection Time: 12/23/17  1:10 AM  Result Value Ref Range Status   Specimen Description SPUTUM  Final   Special Requests NONE  Final   Sputum evaluation   Final    THIS SPECIMEN IS ACCEPTABLE FOR SPUTUM CULTURE Performed at [facility], [address], [place], [place] [address]    Report Status 12/23/2017 FINAL  Final  Culture, respiratory (NON-Expectorated)     Status: None   Collection Time: 12/23/17  1:10 AM  Result Value Ref Range Status   Specimen Description   Final    SPUTUM Performed at [facility], [address], [place], [place] [address]    Special Requests   Final    NONE Reflexed from [id number] Performed at [facility], [address], [place], [place] [address]    Gram Stain   Final    FEW WBC PRESENT, PREDOMINANTLY PMN RARE GRAM POSITIVE COCCI    Culture   Final    FEW Consistent with normal respiratory flora. Performed at [facility], [address] [place], [place] [address]    Report Status 12/25/2017 FINAL   Final  C difficile quick scan w PCR reflex     Status: None   Collection Time: 12/27/17  8:10 PM  Result Value Ref Range Status   C Diff antigen NEGATIVE NEGATIVE Final   C Diff toxin NEGATIVE NEGATIVE Final   C Diff interpretation No C. difficile detected.  Final    Comment: Performed at [facility], [address], [place], [place] [address]     Radiological Exams on Admission: Dg Chest Port 1 View  Result Date: 12/27/2017 CLINICAL DATA:  Sepsis. EXAM: PORTABLE CHEST 1 VIEW COMPARISON:  Chest radiograph 12/27/2017, chest CT 12/22/2017 FINDINGS: Postsurgical changes from CABG. Cardiomediastinal silhouette is normal. Mediastinal contours appear intact. Emphysema, chronic interstitial lung changes. Previously noted thick-walled cystic structure in the left upper lobe has increased in size no  measuring 7.6 cm, with increased surrounding airspace consolidation. Osseous structures are without acute abnormality. Soft tissues are grossly normal. IMPRESSION: Known left upper lobe thick-walled cavitary lesion has increased in size with increased surrounding airspace consolidation. Emphysema with chronic interstitial lung changes. Electronically Signed   By: Ted Mcalpine M.D.   On: 12/27/2017 17:34     EKG: Independently reviewed.  Multifocal rhythm QTc 454, PVC, nonspecific T wave change.  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  Since patient has been taking antibiotics, and the risk of developing C. difficile colitis, but her C. difficile antigen and toxin  are negative.  Possibly due to viral enteritis.  Patient is a septic with hypotension, tachypnea, fever.  Blood pressure responded to IV fluid resuscitation.  Currently SBP>100.   -will admit to SDU as inpt -IVF: 3L NS and then 125 cc/h -f/u GI path panel -will get Procalcitonin and trend lactic acid levels per sepsis protocol.  HCAP: CXR shows worsening consolidation and cavitary change. Per discharge summary, cavitary changes likely due to bulla.  Patient still have fever, and is septic. -will restart IV antibiotics: Vancomycin and cefepime  Chronic respiratory failure with hypoxia and COPD: -DuoNeb nebulizer, as needed albuterol -Fluticasone- Umeclidin-Vilant  Type 2 diabetes, controlled, with neuropathy (HCC): Last A1c 6.1 on 08/15/17, well controled. Patient is not taking med at home. Blood sugar 147. -cbg qAM.  Essential hypertension: -hold Bp meds due to hypotension -IV hydralazine as needed  HLD (hyperlipidemia) -Lipitor  Coronary atherosclerosis: no CP -on lipitor -PRN nitroglycerin  GERD: -Protonix  Rheumatoid arthritis with rheumatoid factor (HCC): -continue prednisone 15 mg daily -Give 1 dose of Solu-Cortef 100 mg (stress dose) -Check cortisol level  Chronic diastolic heart failure (HCC): 2D echo on 08/15/2017 showed EF of 55-60% with grade 1 diastolic dysfunction.  Patient is clinically dehydrated due to severe diarrhea. -Hold Lasix -Check BMP   DVT ppx: SQ Lovenox Code Status: Partial code (I discussed with the patient in the presence of his wife, and explained the meaning of CODE STATUS, patient wants to be partial code, OK for intubation, but not CPR). Family Communication: Yes, patient's wife at bed side Disposition Plan:  Anticipate discharge back to previous home environment Consults called:  none Admission status:  SDU/inpation       Date of Service 12/28/2017    Lorretta Harp [facility] Pager [phone]  If 7PM-7AM, please contact  night-coverage [email or web address] Password [email or web address] 12/28/2017, 1:48 AM

## 2017-12-27 NOTE — Progress Notes (Signed)
Pharmacy Antibiotic Note  Joseph [name] is a 67 y.o. male admitted on 12/27/2017 with pneumonia.   Plan: Cefepime 2 g q12h Vanc 1 g q8h Monitor renal fx cx vt prn  Height: 6\' 2"  (188 cm) Weight: 213 lb (96.6 kg) IBW/kg (Calculated) : 82.2  Temp (24hrs), Avg:102.8 F (39.3 C), Min:102.8 F (39.3 C), Max:102.8 F (39.3 C)  Recent Labs  Lab 12/22/17 1048 12/22/17 1449 12/23/17 0329 12/24/17 0613 12/24/17 1000 12/25/17 0608 12/26/17 0612 12/27/17 1721 12/27/17 1729  WBC  --   --  3.1* 2.6* 1.7* 2.1* 3.0* 4.5  --   CREATININE  --   --  0.60* 0.51*  --  0.52* 0.51* 0.80  --   LATICACIDVEN 1.34 0.84  --   --   --   --   --   --  1.58    Estimated Creatinine Clearance: 107 mL/min (by C-G formula based on SCr of 0.8 mg/dL).    No Known Allergies  02/26/18, PharmD, BCPS, BCCCP Clinical Pharmacist Clinical phone for 12/27/2017 from 0830 - 2100: [phone] If after 2100, please call main pharmacy at: [phone] 12/27/2017 8:11 PM

## 2017-12-27 NOTE — ED Provider Notes (Signed)
[facility] EMERGENCY DEPARTMENT Provider Note   CSN: [id number] Arrival date & time: 12/27/17  1634     History   Chief Complaint Chief Complaint  Patient presents with  . Fever    HPI Joseph [name] is a 67 y.o. male presents emergency department with chief complaint of severe diarrhea and fever.  The past medical history of pancytopenia.  Patient was discharged from the hospital yesterday at noon after being treated for healthcare associated pneumonia.  He has been on Augmentin.  Apparently after discharge he developed severe "explosive" diarrhea which has been persistent.  He has been on Augmentin orally.  He developed high fever and the patient states "I feel the same way I felt when I had sepsis 10 years ago."  He called Dr. Kendrick Fries who advised him to come immediately to the emergency department.  He denies abdominal pain or cramping.  He denies urinary symptoms, cough, rashes.  HPI  Past Medical History:  Diagnosis Date  . CAD (coronary artery disease)    MI 2007, CABG  . Cataracts, bilateral   . Complication of anesthesia    DIFFICULTY AFTER CABG HAD CONFUSION POST OP  . COPD (chronic obstructive pulmonary disease) (HCC)   . Diabetes mellitus    Dx 07-2008 A1C 6.2  . Esophageal dysmotility   . Fatty liver 10/11/09  . Gastroesophageal reflux disease   . Gastroparesis   . Hiatal hernia   . Hypertension   . Neuropathy    (related to RA per neuro note 05-2011)  . Onychomycosis   . Osteoporosis    per DEXA 05-2008  . Oxygen dependent    3l  . Peripheral polyneuropathy    Severe, causing problems with his feet, see surgeries  . Psoriasis   . Pulmonary nodules   . Rheumatoid arthritis (HCC)    [facility]umat[facility]  . Septic shock (HCC) 07/2017    Patient Active Problem List   Diagnosis Date Noted  . Acute on chronic respiratory failure with hypoxia (HCC)   . Hypokalemia 12/24/2017  . Thrombocytopenia (HCC) 12/24/2017  . Leucopenia 12/24/2017    . Healthcare-associated pneumonia 12/22/2017  . Current chronic use of systemic steroids 12/22/2017  . Dyslipidemia 12/15/2017  . Fever 11/09/2017  . Neutropenic fever (HCC) 11/09/2017  . Sepsis (HCC) 09/02/2017  . Septic shock (HCC) 08/14/2017  . PVC (premature ventricular contraction) 07/29/2017  . Atrial flutter (HCC) 10/02/2016  . Hemoptysis 02/23/2016  . Chronic diastolic heart failure (HCC)   . Troponin level elevated   . Chest pain 01/19/2016  . PCP NOTES >>>>>>>>>>>>>>>>>>>>>>>>>>>>>>>>>>>> 05/16/2015  . Chronic obstructive pulmonary emphysema (HCC) 01/13/2015  . Hepatic steatosis 04/17/2014  . Bronchiectasis (HCC) 05/05/2013  . Recurrent pneumonia 11/30/2012  . Pulmonary nodules 11/30/2012  . Gastroparesis 10/20/2012  . Barrett's esophagus 10/19/2012  . Chronic respiratory failure with hypoxia (HCC) 10/17/2012  . Annual physical exam 05/08/2012  . Sicca syndrome (HCC) 10/11/2011  . CAD (coronary artery disease)   . Carotid stenosis 12/10/2010  . Rheumatoid arthritis with rheumatoid factor (HCC) 11/02/2010  . HOARSENESS 10/22/2010  . DYSLIPIDEMIA 09/12/2009  . Type 2 diabetes, controlled, with neuropathy (HCC) 08/01/2008  . Pain in Soft Tissues of Limb 07/13/2008  . Osteoporosis 07/13/2008  . Coronary atherosclerosis 11/25/2006  . 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 Essential hypertension 09/16/2006  . GERD, Barrett's (09-2012 ), gastroparesis 06/06/2006  . PSORIASIS 06/06/2006    Past Surgical History:  Procedure Laterality Date  . A-FLUTTER ABLATION  N/A 11/27/2016   Procedure: A-Flutter Ablation;  Surgeon: Marinus Maw, MD;  Location: [facility] INVASIVE CV LAB;  Service: Cardiovascular;  Laterality: N/A;  . achiles tendon surgery  8/09  . CARDIAC CATHETERIZATION N/A 01/23/2016   Procedure: Left Heart Cath and Cors/Grafts Angiography;  Surgeon: Lyn Records, MD;  Location: [facility] INVASIVE CV LAB;  Service: Cardiovascular;  Laterality: N/A;  .  CARDIOVERSION N/A 10/23/2016   Procedure: CARDIOVERSION;  Surgeon: Rollene Rotunda, MD;  Location: [facility] ENDOSCOPY;  Service: Cardiovascular;  Laterality: N/A;  . CATARACT EXTRACTION Right 03/2017  . CATARACT EXTRACTION Left 05/2017  . CHOLECYSTECTOMY    . CORONARY ARTERY BYPASS GRAFT  2007   (LIMA to the LAD, left radial to obtuse marginal, SVG to first diagonal, SVG to PDA. His ejection fraction to 50-55%  . ESOPHAGOGASTRODUODENOSCOPY N/A 10/19/2012   Procedure: ESOPHAGOGASTRODUODENOSCOPY (EGD);  Surgeon: Hart Carwin, MD;  Location: [facility] ENDOSCOPY;  Service: Endoscopy;  Laterality: N/A;  the dilatation is possible.   Marland Kitchen FOOT SURGERY     to tendon release and repair of osteomyelitis   . LARYNGOSCOPY N/A 01/21/2014   Procedure: LARYNGOSCOPY;  Surgeon: Christia Reading, MD;  Location: [facility] OR;  Service: ENT;  Laterality: N/A;  micro direct laryngoscopy with prolaryn injection/jet venturi ventilation  . TOE AMPUTATION  4-12/ 3 14   d/t a deformity, found to have osteomyelitis, complicated by post-op foot strtess FX        Home Medications    Prior to Admission medications   Medication Sig Start Date End Date Taking? Authorizing Provider  albuterol (PROAIR HFA) 108 (90 Base) MCG/ACT inhaler Inhale 2 puffs into the lungs every 4 (four) hours as needed for wheezing or shortness of breath. 12/01/17  Yes [name], MD  amoxicillin-clavulanate (AUGMENTIN) 875-125 MG tablet Take 1 tablet by mouth every 12 (twelve) hours for 10 days. 12/26/17 01/05/18 Yes Calvert Cantor, MD  Ascorbic Acid (VITAMIN C) 500 MG tablet Take 500 mg by mouth daily.     Yes [provider]  aspirin EC 81 MG tablet Take 81 mg by mouth daily.   Yes [provider]  atorvastatin (LIPITOR) 80 MG tablet TAKE 1 TABLET (80mg ) BY MOUTH DAILY Patient taking differently: Take 80 mg by mouth at bedtime.  12/15/17  Yes Rollene Rotunda, MD  B Complex-C (SUPER B COMPLEX PO) Take 1 tablet by mouth daily.    Yes [provider]   Carbonyl Iron (PERFECT IRON PO) Take 50 mg by mouth every morning.    Yes [provider]  clobetasol cream (TEMOVATE) 0.05 % Apply 1 application topically 2 (two) times daily as needed (rash).    Yes [provider]  dextromethorphan-guaiFENesin (MUCINEX DM) 30-600 MG 12hr tablet Take 1 tablet by mouth 2 (two) times daily. Patient taking differently: Take 1 tablet by mouth at bedtime.  08/22/17  Yes Drema Dallas, MD  folic acid (FOLVITE) 800 MCG tablet Take 800 mcg by mouth daily.   Yes [provider]  furosemide (LASIX) 20 MG tablet Take 3 tablets (60 mg total) by mouth daily. Patient taking differently: Take 60 mg by mouth daily. Take 40 mg in the morning and 20mg  at 3pm 12/15/17  Yes Hochrein, Fayrene Fearing, MD  hydrocortisone cream 1 % Apply 1 application topically 3 (three) times daily as needed for itching.   Yes [provider]  ipratropium-albuterol (DUONEB) 0.5-2.5 (3) MG/3ML SOLN Take 3 mLs by nebulization every 4 (four) hours as needed (shortness of breath or  wheezing). 12/01/17  Yes [name], MD  Loteprednol Etabonate (LOTEMAX) 0.5 % GEL Place 1 drop into both eyes every morning. daily 07/01/14  Yes [provider]  Melatonin 1 MG CAPS Take 4 mg by mouth as needed (Sleep).   Yes [provider]  Multiple Vitamins-Minerals (VITEYES AREDS ADVANCED PO) Take 1 tablet by mouth daily.   Yes [provider]  nitroGLYCERIN (NITROSTAT) 0.4 MG SL tablet Place 1 tablet (0.4 mg total) under the tongue every 5 (five) minutes as needed for chest pain (up to 3 doses for severe chest pain. If no relief after 3rd dose, proceed to the ED for an evaluation). 10/04/16  Yes Rollene Rotunda, MD  OVER THE COUNTER MEDICATION Apply topically as needed. T-Gel Shampoo   Yes [provider]  oxyCODONE-acetaminophen (PERCOCET) 7.5-325 MG per tablet Take 1 tablet by mouth every 6 (six) hours as needed for moderate pain.    Yes [provider]    OXYGEN Inhale 3 L into the lungs continuous.   Yes [provider]  pantoprazole (PROTONIX) 40 MG tablet Take 1 tablet (40 mg total) by mouth 2 (two) times daily. 05/16/17  Yes Armbruster, Willaim Rayas, MD  polyethylene glycol (MIRALAX / GLYCOLAX) packet Take 17 g by mouth daily. 12/27/17  Yes Rizwan, Ladell Heads, MD  potassium chloride (KLOR-CON 10) 10 MEQ tablet TAKE 4 TABLETS ( ) BY MOUTH EVERY MORNING AND TAKE 2 TABLETS ( ) EVERY EVENING Patient taking differently: TAKE 4 TABLETS ( ) BY MOUTH EVERY MORNING AND TAKE 2 TABLETS ( ) 2pm, take along with furosemide 12/15/17  Yes Hochrein, Fayrene Fearing, MD  predniSONE (DELTASONE) 20 MG tablet Take 15 mg by mouth daily with breakfast.    Yes [provider]  pregabalin (LYRICA) 75 MG capsule Take 75 mg by mouth 2 (two) times daily.   Yes [provider]  sodium chloride (OCEAN) 0.65 % SOLN nasal spray Place 1 spray into both nostrils as needed for congestion.   Yes [provider]  TRELEGY ELLIPTA 100-62.5-25 MCG/INH AEPB Inhale 1 puff into the lungs daily. 12/01/17  Yes [name], MD    Family History Family History  Problem Relation Age of Onset  . Heart disease Mother   . Diverticulitis Mother   . Heart disease Father   . Heart attack Father        x2  . Colon cancer Neg Hx   . Prostate cancer Neg Hx     Social History Social History   Tobacco Use  . Smoking status: Former Smoker    Packs/day: 1.00    Years: 40.00    Pack years: 40.00    Last attempt to quit: 08/26/2005    Years since quitting: 12.3  . Smokeless tobacco: Former Neurosurgeon    Quit date: 04/02/2006  . Tobacco comment: smoked x 40 years, 1 ppd  Substance Use Topics  . Alcohol use: No  . Drug use: No     Allergies   Augmentin [amoxicillin-pot clavulanate]   Review of Systems Review of Systems  Ten systems reviewed and are negative for acute change, except as noted in the HPI.   Physical Exam Updated Vital Signs There were no  vitals taken for this visit.  Physical Exam  Constitutional: He is oriented to person, place, and time. He appears well-developed and well-nourished. No distress.  Flushed appearance  HENT:  Head: Normocephalic and atraumatic.  Eyes: Conjunctivae are normal. No scleral icterus.  Neck: Normal range of motion. Neck supple.  Cardiovascular:  Normal rate, regular rhythm and normal heart sounds.  Tachycardic  Pulmonary/Chest: Effort normal and breath sounds normal. No respiratory distress.  Abdominal: Soft. There is no tenderness.  Genitourinary:  Genitourinary Comments: Bright watery yellow foul-smelling stool  Musculoskeletal: He exhibits no edema.  Neurological: He is alert and oriented to person, place, and time.  Skin: Skin is dry. He is not diaphoretic.  Hot  Psychiatric: His behavior is normal.  Nursing note and vitals reviewed.    ED Treatments / Results  Labs (all labs ordered are listed, but only abnormal results are displayed) Labs Reviewed  COMPREHENSIVE METABOLIC PANEL - Abnormal; Notable for the following components:      Result Value   Potassium 3.2 (*)    CO2 20 (*)    Glucose, Bld 147 (*)    Albumin 3.1 (*)    All other components within normal limits  CBC WITH DIFFERENTIAL/PLATELET - Abnormal; Notable for the following components:   RDW 16.2 (*)    Neutro Abs 1.3 (*)    All other components within normal limits  URINALYSIS, ROUTINE W REFLEX MICROSCOPIC - Abnormal; Notable for the following components:   Hgb urine dipstick LARGE (*)    Ketones, ur 5 (*)    RBC / HPF >50 (*)    Bacteria, UA RARE (*)    All other components within normal limits  C DIFFICILE QUICK SCREEN W PCR REFLEX  CULTURE, BLOOD (ROUTINE X 2)  CULTURE, BLOOD (ROUTINE X 2)  GASTROINTESTINAL PANEL BY PCR, STOOL (REPLACES STOOL CULTURE)  I-STAT CG4 LACTIC ACID, ED    EKG EKG Interpretation  Date/Time:  Saturday Dec 27 2017 17:28:22 EDT Ventricular Rate:  120 PR Interval:    QRS  Duration: 78 QT Interval:  321 QTC Calculation: 454 R Axis:   66 Text Interpretation:  Sinus tachycardia Multiform ventricular premature complexes Probable left atrial enlargement Borderline repolarization abnormality No significant change since last tracing Confirmed by Alvira Monday ([id number]) on 12/28/2017 12:09:47 AM   Radiology No results found.  Procedures Procedures (including critical care time)  Medications Ordered in ED Medications  sodium chloride 0.9 % bolus 1,000 mL (has no administration in time range)   CRITICAL CARE Performed by: Arthor Captain Total critical care time: 40 minutes Critical care time was exclusive of separately billable procedures and treating other patients. Critical care was necessary to treat or prevent imminent or life-threatening deterioration. Critical care was time spent personally by me on the following activities: development of treatment plan with patient and/or surrogate as well as nursing, discussions with consultants, evaluation of patient's response to treatment, examination of patient, obtaining history from patient or surrogate, ordering and performing treatments and interventions, ordering and review of laboratory studies, ordering and review of radiographic studies, pulse oximetry and re-evaluation of patient's condition.   Initial Impression / Assessment and Plan / ED Course  I have reviewed the triage vital signs and the nursing notes.  Pertinent labs & imaging results that were available during my care of the patient were reviewed by me and considered in my medical decision making (see chart for details).  Clinical Course as of Dec 29 2  Sat Dec 27, 2017  1823 Lactic Acid, Venous: 1.58 [AH]  2050 WBC: 4.5 [AH]    Clinical Course User Index [AH] Arthor Captain, PA-C    Patient lactate negative, negative for elevated leukocytosis.  He required multiple fluid volume boluses to achieve a normotensive state.  Given his continued  high volume diarrhea feel  it would be unsafe to discharge this patient currently.  Patient will be admitted to the hospitalist service.  Final Clinical Impressions(s) / ED Diagnoses   Final diagnoses:  Fever, unspecified fever cause  Diarrhea, unspecified type  Dehydration    ED Discharge Orders    None       Arthor Captain, PA-C 12/28/17 1093    Alvira Monday, MD 12/28/17 1139

## 2017-12-28 DIAGNOSIS — M810 Age-related osteoporosis without current pathological fracture: Secondary | ICD-10-CM | POA: Diagnosis present

## 2017-12-28 DIAGNOSIS — M160 Bilateral primary osteoarthritis of hip: Secondary | ICD-10-CM | POA: Diagnosis not present

## 2017-12-28 DIAGNOSIS — E1142 Type 2 diabetes mellitus with diabetic polyneuropathy: Secondary | ICD-10-CM | POA: Diagnosis not present

## 2017-12-28 DIAGNOSIS — D638 Anemia in other chronic diseases classified elsewhere: Secondary | ICD-10-CM | POA: Diagnosis present

## 2017-12-28 DIAGNOSIS — M0579 Rheumatoid arthritis with rheumatoid factor of multiple sites without organ or systems involvement: Secondary | ICD-10-CM | POA: Diagnosis not present

## 2017-12-28 DIAGNOSIS — R197 Diarrhea, unspecified: Secondary | ICD-10-CM | POA: Diagnosis not present

## 2017-12-28 DIAGNOSIS — A419 Sepsis, unspecified organism: Secondary | ICD-10-CM | POA: Diagnosis not present

## 2017-12-28 DIAGNOSIS — M25551 Pain in right hip: Secondary | ICD-10-CM | POA: Diagnosis not present

## 2017-12-28 DIAGNOSIS — J471 Bronchiectasis with (acute) exacerbation: Secondary | ICD-10-CM | POA: Diagnosis not present

## 2017-12-28 DIAGNOSIS — M069 Rheumatoid arthritis, unspecified: Secondary | ICD-10-CM | POA: Diagnosis present

## 2017-12-28 DIAGNOSIS — E1143 Type 2 diabetes mellitus with diabetic autonomic (poly)neuropathy: Secondary | ICD-10-CM | POA: Diagnosis not present

## 2017-12-28 DIAGNOSIS — R509 Fever, unspecified: Secondary | ICD-10-CM | POA: Diagnosis not present

## 2017-12-28 DIAGNOSIS — J9621 Acute and chronic respiratory failure with hypoxia: Secondary | ICD-10-CM | POA: Diagnosis not present

## 2017-12-28 DIAGNOSIS — J449 Chronic obstructive pulmonary disease, unspecified: Secondary | ICD-10-CM | POA: Diagnosis not present

## 2017-12-28 DIAGNOSIS — J9611 Chronic respiratory failure with hypoxia: Secondary | ICD-10-CM | POA: Diagnosis not present

## 2017-12-28 DIAGNOSIS — J479 Bronchiectasis, uncomplicated: Secondary | ICD-10-CM | POA: Diagnosis not present

## 2017-12-28 DIAGNOSIS — Y95 Nosocomial condition: Secondary | ICD-10-CM | POA: Diagnosis not present

## 2017-12-28 DIAGNOSIS — E860 Dehydration: Secondary | ICD-10-CM | POA: Diagnosis not present

## 2017-12-28 DIAGNOSIS — I2510 Atherosclerotic heart disease of native coronary artery without angina pectoris: Secondary | ICD-10-CM | POA: Diagnosis not present

## 2017-12-28 DIAGNOSIS — D709 Neutropenia, unspecified: Secondary | ICD-10-CM | POA: Diagnosis not present

## 2017-12-28 DIAGNOSIS — J984 Other disorders of lung: Secondary | ICD-10-CM | POA: Diagnosis present

## 2017-12-28 DIAGNOSIS — K760 Fatty (change of) liver, not elsewhere classified: Secondary | ICD-10-CM | POA: Diagnosis present

## 2017-12-28 DIAGNOSIS — R6510 Systemic inflammatory response syndrome (SIRS) of non-infectious origin without acute organ dysfunction: Secondary | ICD-10-CM | POA: Diagnosis not present

## 2017-12-28 DIAGNOSIS — J438 Other emphysema: Secondary | ICD-10-CM | POA: Diagnosis not present

## 2017-12-28 DIAGNOSIS — I10 Essential (primary) hypertension: Secondary | ICD-10-CM | POA: Diagnosis not present

## 2017-12-28 DIAGNOSIS — I5032 Chronic diastolic (congestive) heart failure: Secondary | ICD-10-CM | POA: Diagnosis not present

## 2017-12-28 DIAGNOSIS — K3184 Gastroparesis: Secondary | ICD-10-CM | POA: Diagnosis not present

## 2017-12-28 DIAGNOSIS — E876 Hypokalemia: Secondary | ICD-10-CM | POA: Diagnosis present

## 2017-12-28 DIAGNOSIS — E7849 Other hyperlipidemia: Secondary | ICD-10-CM | POA: Diagnosis not present

## 2017-12-28 DIAGNOSIS — K219 Gastro-esophageal reflux disease without esophagitis: Secondary | ICD-10-CM | POA: Diagnosis present

## 2017-12-28 DIAGNOSIS — I110 Hypertensive heart disease with heart failure: Secondary | ICD-10-CM | POA: Diagnosis not present

## 2017-12-28 DIAGNOSIS — M05711 Rheumatoid arthritis with rheumatoid factor of right shoulder without organ or systems involvement: Secondary | ICD-10-CM | POA: Diagnosis not present

## 2017-12-28 DIAGNOSIS — E1140 Type 2 diabetes mellitus with diabetic neuropathy, unspecified: Secondary | ICD-10-CM | POA: Diagnosis not present

## 2017-12-28 DIAGNOSIS — Y92009 Unspecified place in unspecified non-institutional (private) residence as the place of occurrence of the external cause: Secondary | ICD-10-CM | POA: Diagnosis not present

## 2017-12-28 DIAGNOSIS — L03119 Cellulitis of unspecified part of limb: Secondary | ICD-10-CM | POA: Diagnosis not present

## 2017-12-28 DIAGNOSIS — J189 Pneumonia, unspecified organism: Secondary | ICD-10-CM | POA: Diagnosis not present

## 2017-12-28 DIAGNOSIS — M0500 Felty's syndrome, unspecified site: Secondary | ICD-10-CM | POA: Diagnosis present

## 2017-12-28 DIAGNOSIS — J849 Interstitial pulmonary disease, unspecified: Secondary | ICD-10-CM | POA: Diagnosis not present

## 2017-12-28 DIAGNOSIS — K521 Toxic gastroenteritis and colitis: Secondary | ICD-10-CM | POA: Diagnosis not present

## 2017-12-28 DIAGNOSIS — E785 Hyperlipidemia, unspecified: Secondary | ICD-10-CM | POA: Diagnosis not present

## 2017-12-28 DIAGNOSIS — Z6827 Body mass index (BMI) 27.0-27.9, adult: Secondary | ICD-10-CM | POA: Diagnosis not present

## 2017-12-28 DIAGNOSIS — J439 Emphysema, unspecified: Secondary | ICD-10-CM | POA: Diagnosis not present

## 2017-12-28 LAB — BRAIN NATRIURETIC PEPTIDE: B NATRIURETIC PEPTIDE 5: 143.1 pg/mL — AB (ref 0.0–100.0)

## 2017-12-28 LAB — GASTROINTESTINAL PANEL BY PCR, STOOL (REPLACES STOOL CULTURE)
ADENOVIRUS F40/41: NOT DETECTED
Astrovirus: NOT DETECTED
CRYPTOSPORIDIUM: NOT DETECTED
Campylobacter species: NOT DETECTED
Cyclospora cayetanensis: NOT DETECTED
ENTEROPATHOGENIC E COLI (EPEC): NOT DETECTED
Entamoeba histolytica: NOT DETECTED
Enteroaggregative E coli (EAEC): NOT DETECTED
Enterotoxigenic E coli (ETEC): NOT DETECTED
Giardia lamblia: NOT DETECTED
Norovirus GI/GII: NOT DETECTED
Plesimonas shigelloides: NOT DETECTED
Rotavirus A: NOT DETECTED
Salmonella species: NOT DETECTED
Sapovirus (I, II, IV, and V): NOT DETECTED
Shiga like toxin producing E coli (STEC): NOT DETECTED
Shigella/Enteroinvasive E coli (EIEC): NOT DETECTED
Vibrio cholerae: NOT DETECTED
Vibrio species: NOT DETECTED
YERSINIA ENTEROCOLITICA: NOT DETECTED

## 2017-12-28 LAB — LACTIC ACID, PLASMA: Lactic Acid, Venous: 0.9 mmol/L (ref 0.5–1.9)

## 2017-12-28 LAB — BASIC METABOLIC PANEL
Anion gap: 10 (ref 5–15)
BUN: 10 mg/dL (ref 6–20)
CHLORIDE: 109 mmol/L (ref 101–111)
CO2: 16 mmol/L — AB (ref 22–32)
CREATININE: 0.72 mg/dL (ref 0.61–1.24)
Calcium: 8.3 mg/dL — ABNORMAL LOW (ref 8.9–10.3)
GFR calc non Af Amer: >60 mL/min (ref >60)
Glucose, Bld: 146 mg/dL — ABNORMAL HIGH (ref 65–99)
Potassium: 3.3 mmol/L — ABNORMAL LOW (ref 3.5–5.1)
SODIUM: 135 mmol/L (ref 135–145)

## 2017-12-28 LAB — CBC
HCT: 34.8 % — ABNORMAL LOW (ref 39.0–52.0)
Hemoglobin: 11.5 g/dL — ABNORMAL LOW (ref 13.0–17.0)
MCH: 28.5 pg (ref 26.0–34.0)
MCHC: 33 g/dL (ref 30.0–36.0)
MCV: 86.4 fL (ref 78.0–100.0)
PLATELETS: 156 10*3/uL (ref 150–400)
RBC: 4.03 MIL/uL — AB (ref 4.22–5.81)
RDW: 16.6 % — AB (ref 11.5–15.5)
WBC: 2.3 10*3/uL — ABNORMAL LOW (ref 4.0–10.5)

## 2017-12-28 LAB — PROCALCITONIN: Procalcitonin: 0.13 ng/mL

## 2017-12-28 LAB — CBG MONITORING, ED: GLUCOSE-CAPILLARY: 147 mg/dL — AB (ref 65–99)

## 2017-12-28 LAB — CORTISOL-AM, BLOOD: Cortisol - AM: 63.8 ug/dL — ABNORMAL HIGH (ref 6.7–22.6)

## 2017-12-28 LAB — MRSA PCR SCREENING: MRSA BY PCR: NEGATIVE

## 2017-12-28 MED ORDER — ATORVASTATIN CALCIUM 80 MG PO TABS
80.0000 mg | ORAL_TABLET | Freq: Every day | ORAL | Status: DC
  Administered 2017-12-28 – 2018-01-08 (×13): 80 mg via ORAL
  Filled 2017-12-28 (×15): qty 1

## 2017-12-28 MED ORDER — PREDNISONE 5 MG PO TABS
15.0000 mg | ORAL_TABLET | Freq: Every day | ORAL | Status: DC
  Administered 2017-12-28 – 2018-01-09 (×13): 15 mg via ORAL
  Filled 2017-12-28 (×14): qty 1

## 2017-12-28 MED ORDER — ACETAMINOPHEN 325 MG PO TABS
650.0000 mg | ORAL_TABLET | Freq: Four times a day (QID) | ORAL | Status: DC | PRN
  Administered 2017-12-28 – 2018-01-01 (×7): 650 mg via ORAL
  Filled 2017-12-28 (×8): qty 2

## 2017-12-28 MED ORDER — ALBUTEROL SULFATE (2.5 MG/3ML) 0.083% IN NEBU: 2.5000 mg | INHALATION_SOLUTION | RESPIRATORY_TRACT | Status: DC | PRN

## 2017-12-28 MED ORDER — ASPIRIN EC 81 MG PO TBEC
81.0000 mg | DELAYED_RELEASE_TABLET | Freq: Every day | ORAL | Status: DC
  Administered 2017-12-28 – 2018-01-09 (×13): 81 mg via ORAL
  Filled 2017-12-28 (×14): qty 1

## 2017-12-28 MED ORDER — CLOBETASOL PROPIONATE 0.05 % EX CREA: 1.0000 "application " | TOPICAL_CREAM | Freq: Two times a day (BID) | CUTANEOUS | Status: DC | PRN

## 2017-12-28 MED ORDER — VITAMIN C 500 MG PO TABS
500.0000 mg | ORAL_TABLET | Freq: Every day | ORAL | Status: DC
  Administered 2017-12-28 – 2018-01-09 (×13): 500 mg via ORAL
  Filled 2017-12-28 (×14): qty 1

## 2017-12-28 MED ORDER — PROSIGHT PO TABS
1.0000 | ORAL_TABLET | Freq: Every day | ORAL | Status: DC
  Administered 2017-12-28 – 2018-01-09 (×13): 1 via ORAL
  Filled 2017-12-28 (×14): qty 1

## 2017-12-28 MED ORDER — UMECLIDINIUM BROMIDE 62.5 MCG/INH IN AEPB
1.0000 | INHALATION_SPRAY | Freq: Every day | RESPIRATORY_TRACT | Status: DC
  Filled 2017-12-28: qty 7

## 2017-12-28 MED ORDER — MELATONIN 3 MG PO TABS
3.0000 mg | ORAL_TABLET | Freq: Every evening | ORAL | Status: DC | PRN
  Administered 2017-12-28: 3 mg via ORAL
  Filled 2017-12-28: qty 1

## 2017-12-28 MED ORDER — IPRATROPIUM-ALBUTEROL 0.5-2.5 (3) MG/3ML IN SOLN
3.0000 mL | Freq: Four times a day (QID) | RESPIRATORY_TRACT | Status: DC
Start: 2017-12-28 — End: 2017-12-29
  Administered 2017-12-28 (×3): 3 mL via RESPIRATORY_TRACT
  Filled 2017-12-28 (×3): qty 3

## 2017-12-28 MED ORDER — ZOLPIDEM TARTRATE 5 MG PO TABS: 5.0000 mg | ORAL_TABLET | Freq: Every evening | ORAL | Status: DC | PRN

## 2017-12-28 MED ORDER — NITROGLYCERIN 0.4 MG SL SUBL
0.4000 mg | SUBLINGUAL_TABLET | SUBLINGUAL | Status: DC | PRN
  Administered 2018-01-07 (×2): 0.4 mg via SUBLINGUAL
  Filled 2017-12-28: qty 1

## 2017-12-28 MED ORDER — LOTEPREDNOL ETABONATE 0.5 % OP SUSP
1.0000 [drp] | Freq: Every morning | OPHTHALMIC | Status: DC
  Administered 2017-12-29 – 2018-01-09 (×12): 1 [drp] via OPHTHALMIC
  Filled 2017-12-28: qty 5

## 2017-12-28 MED ORDER — HYDRALAZINE HCL 20 MG/ML IJ SOLN: 5.0000 mg | INTRAMUSCULAR | Status: DC | PRN

## 2017-12-28 MED ORDER — OXYCODONE-ACETAMINOPHEN 7.5-325 MG PO TABS
1.0000 | ORAL_TABLET | Freq: Four times a day (QID) | ORAL | Status: DC | PRN
Start: 2017-12-28 — End: 2017-12-29

## 2017-12-28 MED ORDER — FOLIC ACID 1 MG PO TABS
1000.0000 ug | ORAL_TABLET | Freq: Every day | ORAL | Status: DC
  Administered 2017-12-31 – 2018-01-09 (×6): 1 mg via ORAL
  Filled 2017-12-28 (×13): qty 1

## 2017-12-28 MED ORDER — PREGABALIN 75 MG PO CAPS
75.0000 mg | ORAL_CAPSULE | Freq: Two times a day (BID) | ORAL | Status: DC
  Administered 2017-12-28 – 2018-01-09 (×25): 75 mg via ORAL
  Filled 2017-12-28 (×15): qty 1
  Filled 2017-12-28: qty 3
  Filled 2017-12-28 (×10): qty 1

## 2017-12-28 MED ORDER — FERROUS SULFATE 325 (65 FE) MG PO TABS
325.0000 mg | ORAL_TABLET | Freq: Every day | ORAL | Status: DC
  Administered 2017-12-29 – 2018-01-09 (×12): 325 mg via ORAL
  Filled 2017-12-28 (×14): qty 1

## 2017-12-28 MED ORDER — ENOXAPARIN SODIUM 40 MG/0.4ML ~~LOC~~ SOLN
40.0000 mg | SUBCUTANEOUS | Status: DC
  Administered 2017-12-29 – 2018-01-08 (×11): 40 mg via SUBCUTANEOUS
  Filled 2017-12-28 (×12): qty 0.4

## 2017-12-28 MED ORDER — SALINE SPRAY 0.65 % NA SOLN: 1.0000 | NASAL | Status: DC | PRN

## 2017-12-28 MED ORDER — CARBONYL IRON 45 MG PO TABS
45.0000 mg | ORAL_TABLET | ORAL | Status: DC
  Filled 2017-12-28: qty 1

## 2017-12-28 MED ORDER — PANTOPRAZOLE SODIUM 40 MG PO TBEC
40.0000 mg | DELAYED_RELEASE_TABLET | Freq: Two times a day (BID) | ORAL | Status: DC
  Administered 2017-12-28 – 2017-12-29 (×3): 40 mg via ORAL
  Filled 2017-12-28 (×4): qty 1

## 2017-12-28 MED ORDER — DM-GUAIFENESIN ER 30-600 MG PO TB12: 1.0000 | ORAL_TABLET | Freq: Two times a day (BID) | ORAL | Status: DC | PRN

## 2017-12-28 MED ORDER — OCUVITE-LUTEIN PO CAPS
ORAL_CAPSULE | Freq: Every day | ORAL | Status: DC
  Filled 2017-12-28: qty 1

## 2017-12-28 MED ORDER — SODIUM CHLORIDE 0.9 % IV SOLN
2.0000 g | Freq: Three times a day (TID) | INTRAVENOUS | Status: DC
  Administered 2017-12-28 – 2017-12-29 (×3): 2 g via INTRAVENOUS
  Filled 2017-12-28 (×5): qty 2

## 2017-12-28 MED ORDER — FLUTICASONE-UMECLIDIN-VILANT 100-62.5-25 MCG/INH IN AEPB: 1.0000 | INHALATION_SPRAY | Freq: Every day | RESPIRATORY_TRACT | Status: DC

## 2017-12-28 MED ORDER — FLUTICASONE FUROATE-VILANTEROL 100-25 MCG/INH IN AEPB
1.0000 | INHALATION_SPRAY | Freq: Every day | RESPIRATORY_TRACT | Status: DC
  Filled 2017-12-28: qty 28

## 2017-12-28 MED ORDER — ACETAMINOPHEN 325 MG PO TABS: 650.0000 mg | ORAL_TABLET | Freq: Four times a day (QID) | ORAL | Status: DC | PRN

## 2017-12-28 MED ORDER — HYDROCORTISONE 1 % EX CREA
1.0000 "application " | TOPICAL_CREAM | Freq: Three times a day (TID) | CUTANEOUS | Status: DC | PRN
  Filled 2017-12-28: qty 28

## 2017-12-28 MED ORDER — ONDANSETRON HCL 4 MG/2ML IJ SOLN
4.0000 mg | Freq: Three times a day (TID) | INTRAMUSCULAR | Status: DC | PRN
  Filled 2017-12-28 (×2): qty 2

## 2017-12-28 NOTE — Progress Notes (Signed)
PROGRESS NOTE  ANDERSEN IORIO MCN:[id number] DOB: 08-22-1951 DOA: 12/27/2017 PCP: Lennart Pall, MD  HPI/Recap of past 24 hours:  : Joseph [name] is a 68 y.o. male with medical history significant of hypertension, hyperlipidemia, COPD, chronic respiratory failure on 3 L nasal cannula oxygen, GERD, CAD, gastroparesis, psoriasis, rheumatoid arthritis, pancytopenia, dCHF, who presents with diarrhea and fever.  Patient was hospitalized from 4/29-5/3 due to HCAP. He was discharged home on Augmentin Friday.  Patient states that he developed severe and worsening diarrhea the next day which was Saturday.  He has had more 20 times of watery bowel movements.  No nausea, vomiting or abdominal pain.  He also has fever of 102 and chills.  Wife stated that patient has some diarrhea as well as fever when he was discharged home.  Patient still has diarrhea stool today but he is feeling little better compared to what he was when he first came to yesterday.  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  Since patient has been taking antibiotics, and the risk of developing C. difficile colitis, but her C. difficile antigen and toxin are negative.  Possibly due to viral enteritis.  Patient is a septic with hypotension, tachypnea, fever.  Blood pressure responded to IV fluid resuscitation.  Currently SBP>100.    -IVF: 3L NS and then 125 cc/h -f/u GI path panel -will get Procalcitonin and trend lactic acid levels per sepsis protocol.  HCAP: CXR shows  worsening consolidation and cavitary change. Per discharge summary, cavitary changes likely due to bulla.  Patient still have fever, and is septic. - IV antibiotics: Vancomycin and cefepime  Chronic respiratory failure with hypoxia and COPD: -DuoNeb nebulizer, as needed albuterol -Fluticasone- Umeclidin-Vilant  Type 2 diabetes, controlled, with neuropathy (HCC): Last A1c 6.1 on 08/15/17, well controled. Patient is not taking med at home. Blood sugar 147. -cbg qAM.  Essential hypertension: -hold Bp meds due to hypotension -IV hydralazine as needed  HLD (hyperlipidemia) -Lipitor  Coronary atherosclerosis: no CP -on lipitor -PRN nitroglycerin  GERD: -Protonix  Rheumatoid arthritis with rheumatoid factor (HCC): -continue prednisone 15 mg daily -Give 1 dose of Solu-Cortef 100 mg (stress dose) -Check cortisol level  Chronic diastolic heart failure (HCC): 2D echo on 08/15/2017 showed EF of 55-60% with grade 1 diastolic dysfunction.  Patient is clinically dehydrated due to severe diarrhea. -Hold Lasix -Check BMP   DVT ppx: SQ Lovenox Code Status: Partial code Family Communication: Yes, patient's wife at bed side Disposition Plan:  Anticipate discharge back to previous home environment Consults called:  none         Antimicrobials:  Vancomycin and cefepime  Objective: Vitals:   12/28/17 1300 12/28/17 1400 12/28/17 1500 12/28/17 1600  BP: 114/78 (!) 120/58 128/70 134/82  Pulse: 71 86 (!) 39 (!) 48  Resp: 19 18 (!) 21 (!) 23  Temp:      TempSrc:      SpO2: 100% 100% 98% 97%  Weight:      Height:        Intake/Output  Summary (Last 24 hours) at 12/28/2017 1929 Last data filed at 12/28/2017 1039 Gross per 24 hour  Intake 1200 ml  Output 1900 ml  Net -700 ml   Filed Weights   12/27/17 1725  Weight: 96.6 kg (213 lb)   Body mass index is 27.35 kg/m.  Exam:   General: Not in acute distress HEENT:       Eyes: PERRL, EOMI, no scleral icterus.        ENT: No discharge from the ears and nose, no pharynx injection, no tonsillar enlargement.        Neck: No JVD, no bruit, no mass felt. Heme: No neck lymph node enlargement. Cardiac: S1/S2, RRR, No murmurs, No gallops or rubs. Respiratory: No rales, wheezing, rhonchi or rubs. GI: Soft, nondistended, nontender, no rebound pain, no organomegaly, BS present. GU: No hematuria Ext: No pitting leg edema bilaterally. 2+DP/PT pulse bilaterally. Musculoskeletal: No joint deformities, No joint redness or warmth, no limitation of ROM in spin. Skin: No rashes.  Neuro: Alert, oriented X3, cranial nerves II-XII grossly intact, moves all extremities normally. Psych: Patient is not psychotic, no suicidal or hemocidal ideation.     Data Reviewed: CBC: Recent Labs  Lab 12/22/17 1046  12/24/17 1000 12/25/17 0608 12/26/17 0612 12/27/17 1721 12/28/17 0331  WBC 4.4   < > 1.7* 2.1* 3.0* 4.5 2.3*  NEUTROABS 0.8*  --  0.5* 0.6* 1.1* 1.3*  --   HGB 13.9   < > 11.0* 11.2* 11.2* 13.1 11.5*  HCT 39.5   < > 32.8* 34.3* 33.7* 39.0 34.8*  MCV 85.1   < > 86.1 87.3 86.9 85.2 86.4  PLT 147*   < > 113* 131* 133* 183 156   < > = values in this interval not displayed.   Basic Metabolic Panel: Recent Labs  Lab 12/24/17 0613 12/25/17 0608 12/26/17 0612 12/27/17 1721 12/28/17 0331  NA 137 142 140 135 135  K 3.4* 3.4* 3.5 3.2* 3.3*  CL 104 108 107 103 109  CO2 24 25 23  20* 16*  GLUCOSE 121* 137* 100* 147* 146*  BUN 13 15 14 10 10   CREATININE 0.51* 0.52* 0.51* 0.80 0.72  CALCIUM 8.3* 8.5* 9.0 9.2 8.3*   GFR: Estimated Creatinine Clearance: 107 mL/min (by C-G formula based on SCr of 0.72 mg/dL). Liver Function Tests: Recent Labs  Lab 12/22/17 1046 12/27/17 1721  AST 68* 32  ALT 66* 37  ALKPHOS 75 76  BILITOT 1.8* 1.1  PROT 6.8 7.2  ALBUMIN 3.4* 3.1*   No results for input(s): LIPASE, AMYLASE in the last 168 hours. No results for input(s): AMMONIA in the last 168 hours. Coagulation Profile: No  results for input(s): INR, PROTIME in the last 168 hours. Cardiac Enzymes: No results for input(s): CKTOTAL, CKMB, CKMBINDEX, TROPONINI in the last 168 hours. BNP (last 3 results) No results for input(s): PROBNP in the last 8760 hours. HbA1C: No results for input(s): HGBA1C in the last 72 hours. CBG: Recent Labs  Lab 12/25/17 1655 12/25/17 2101 12/26/17 0727 12/26/17 1214 12/28/17 1016  GLUCAP 174* 141* 95 120* 147*   Lipid Profile: No results for input(s): CHOL, HDL, LDLCALC, TRIG, CHOLHDL, LDLDIRECT in the last 72 hours. Thyroid Function Tests: No results for input(s): TSH, T4TOTAL, FREET4, T3FREE, THYROIDAB in the last 72 hours. Anemia Panel: No results for input(s): VITAMINB12, FOLATE, FERRITIN, TIBC, IRON, RETICCTPCT in the last 72 hours. Urine analysis:    Component Value Date/Time   COLORURINE YELLOW 12/27/2017 2115   APPEARANCEUR  CLEAR 12/27/2017 2115   LABSPEC 1.016 12/27/2017 2115   PHURINE 6.0 12/27/2017 2115   GLUCOSEU NEGATIVE 12/27/2017 2115   HGBUR LARGE (A) 12/27/2017 2115   HGBUR negative 04/12/2008 1103   BILIRUBINUR NEGATIVE 12/27/2017 2115   KETONESUR 5 (A) 12/27/2017 2115   PROTEINUR NEGATIVE 12/27/2017 2115   UROBILINOGEN 0.2 10/17/2012 1236   NITRITE NEGATIVE 12/27/2017 2115   LEUKOCYTESUR NEGATIVE 12/27/2017 2115   Sepsis Labs: @LABRCNTIP (procalcitonin:4,lacticidven:4)  ) Recent Results (from the past 240 hour(s))  Blood Culture (routine x 2)     Status: None   Collection Time: 12/22/17 10:46 AM  Result Value Ref Range Status   Specimen Description   Final    BLOOD LEFT ARM Performed at [facility], [address] [place], [place] [address]    Special Requests   Final    BOTTLES DRAWN AEROBIC AND ANAEROBIC Blood Culture adequate volume Performed at [facility], [address] [place], [place] [address]    Culture   Final    NO GROWTH 5 DAYS Performed at [facility], [address] [place], [place]  [address]    Report Status 12/27/2017 FINAL  Final  Blood Culture (routine x 2)     Status: None   Collection Time: 12/22/17 10:55 AM  Result Value Ref Range Status   Specimen Description   Final    BLOOD LEFT HAND Performed at [facility], [address] [place], [place] [address]    Special Requests   Final    BOTTLES DRAWN AEROBIC AND ANAEROBIC Blood Culture adequate volume Performed at [facility], [address] [place], [place] [address]    Culture   Final    NO GROWTH 5 DAYS Performed at [facility], [address] [place], [place] [address]    Report Status 12/27/2017 FINAL  Final  MRSA PCR Screening     Status: None   Collection Time: 12/22/17  8:41 PM  Result Value Ref Range Status   MRSA by PCR NEGATIVE NEGATIVE Final    Comment:        The GeneXpert MRSA Assay (FDA approved for NASAL specimens only), is one component of a comprehensive MRSA colonization surveillance program. It is not intended to diagnose MRSA infection nor to guide or monitor treatment for MRSA infections. Performed at [facility], [address] [place], [place] [address]   Culture, sputum-assessment     Status: None   Collection Time: 12/23/17  1:10 AM  Result Value Ref Range Status   Specimen Description SPUTUM  Final   Special Requests NONE  Final   Sputum evaluation   Final    THIS SPECIMEN IS ACCEPTABLE FOR SPUTUM CULTURE Performed at [facility], [address], [place], [place] [address]    Report Status 12/23/2017 FINAL  Final  Culture, respiratory (NON-Expectorated)     Status: None   Collection Time: 12/23/17  1:10 AM  Result Value Ref Range Status   Specimen Description   Final    SPUTUM Performed at [facility], [address] [place], [place] [address]    Special Requests   Final    NONE Reflexed from [id number] Performed at [facility], [address],  [place], [place] [address]    Gram Stain   Final    FEW WBC PRESENT, PREDOMINANTLY PMN RARE GRAM POSITIVE COCCI  Culture   Final    FEW Consistent with normal respiratory flora. Performed at [facility], [address], [place], [place] [address]    Report Status 12/25/2017 FINAL  Final  Blood Culture (routine x 2)     Status: None (Preliminary result)   Collection Time: 12/27/17  5:21 PM  Result Value Ref Range Status   Specimen Description BLOOD LEFT FOREARM  Final   Special Requests   Final    BOTTLES DRAWN AEROBIC AND ANAEROBIC Blood Culture adequate volume   Culture   Final    NO GROWTH < 24 HOURS Performed at [facility], [address] [place], [place] [address]    Report Status PENDING  Incomplete  Blood Culture (routine x 2)     Status: None (Preliminary result)   Collection Time: 12/27/17  5:22 PM  Result Value Ref Range Status   Specimen Description BLOOD RIGHT WRIST  Final   Special Requests   Final    BOTTLES DRAWN AEROBIC AND ANAEROBIC Blood Culture adequate volume   Culture   Final    NO GROWTH < 24 HOURS Performed at [facility], [address], [place], [place] [address]    Report Status PENDING  Incomplete  C difficile quick scan w PCR reflex     Status: None   Collection Time: 12/27/17  8:10 PM  Result Value Ref Range Status   C Diff antigen NEGATIVE NEGATIVE Final   C Diff toxin NEGATIVE NEGATIVE Final   C Diff interpretation No C. difficile detected.  Final    Comment: Performed at [facility], [address], [place], [place] [address]  Gastrointestinal Panel by PCR , Stool     Status: None   Collection Time: 12/27/17  8:10 PM  Result Value Ref Range Status   Campylobacter species NOT DETECTED NOT DETECTED Final   Plesimonas shigelloides NOT DETECTED NOT DETECTED Final   Salmonella species NOT DETECTED NOT DETECTED Final   Yersinia enterocolitica NOT DETECTED NOT DETECTED Final   Vibrio species NOT DETECTED NOT DETECTED  Final   Vibrio cholerae NOT DETECTED NOT DETECTED Final   Enteroaggregative E coli (EAEC) NOT DETECTED NOT DETECTED Final   Enteropathogenic E coli (EPEC) NOT DETECTED NOT DETECTED Final   Enterotoxigenic E coli (ETEC) NOT DETECTED NOT DETECTED Final   Shiga like toxin producing E coli (STEC) NOT DETECTED NOT DETECTED Final   Shigella/Enteroinvasive E coli (EIEC) NOT DETECTED NOT DETECTED Final   Cryptosporidium NOT DETECTED NOT DETECTED Final   Cyclospora cayetanensis NOT DETECTED NOT DETECTED Final   Entamoeba histolytica NOT DETECTED NOT DETECTED Final   Giardia lamblia NOT DETECTED NOT DETECTED Final   Adenovirus F40/41 NOT DETECTED NOT DETECTED Final   Astrovirus NOT DETECTED NOT DETECTED Final   Norovirus GI/GII NOT DETECTED NOT DETECTED Final   Rotavirus A NOT DETECTED NOT DETECTED Final   Sapovirus (I, II, IV, and V) NOT DETECTED NOT DETECTED Final    Comment: Performed at [facility], [address] [place], [place] [address]  MRSA PCR Screening     Status: None   Collection Time: 12/28/17  5:27 PM  Result Value Ref Range Status   MRSA by PCR NEGATIVE NEGATIVE Final    Comment:        The GeneXpert MRSA Assay (FDA approved for NASAL specimens only), is one component of a comprehensive MRSA colonization surveillance program. It is not intended to diagnose MRSA infection nor  to guide or monitor treatment for MRSA infections. Performed at [facility], [address] [place], [place] [address]       Studies: No results found.  Scheduled Meds: . acetaminophen  1,000 mg Oral Once  . aspirin EC  81 mg Oral Daily  . atorvastatin  80 mg Oral QHS  . enoxaparin (LOVENOX) injection  40 mg Subcutaneous Q24H  . ferrous sulfate  325 mg Oral Q breakfast  . fluticasone furoate-vilanterol  1 puff Inhalation Daily  . folic acid  1,000 mcg Oral Daily  . ipratropium-albuterol  3 mL Nebulization Q6H  . loteprednol  1 drop Both Eyes q morning - 10a  .  multivitamin  1 tablet Oral Daily  . pantoprazole  40 mg Oral BID  . predniSONE  15 mg Oral Q breakfast  . pregabalin  75 mg Oral BID  . umeclidinium bromide  1 puff Inhalation Daily  . vitamin C  500 mg Oral Daily    Continuous Infusions: . sodium chloride 125 mL/hr at 12/28/17 1846  . ceFEPime (MAXIPIME) IV 2 g (12/28/17 1843)  . vancomycin Stopped (12/28/17 0751)     LOS: 0 days     Myrtie Neither, MD [facility] Pager [phone] To reach me or the doctor on call, go to: [email or web address] Password [facility]  12/28/2017, 7:29 PM

## 2017-12-28 NOTE — ED Notes (Signed)
Pt placed in hospital bed for comfort.

## 2017-12-28 NOTE — ED Notes (Signed)
Message sent to pharmacy  To request verification of unverified med orders

## 2017-12-28 NOTE — Progress Notes (Signed)
RN notified C. Bodenheimer, NP requesting patient be placed on neutropenic precautions per patient and wife's request since WBC is 2.3.  Order received. P.J. Henderson Newcomer, RN

## 2017-12-29 DIAGNOSIS — E860 Dehydration: Secondary | ICD-10-CM

## 2017-12-29 LAB — GLUCOSE, CAPILLARY
Glucose-Capillary: 157 mg/dL — ABNORMAL HIGH (ref 65–99)
Glucose-Capillary: 103 mg/dL — ABNORMAL HIGH (ref 65–99)

## 2017-12-29 MED ORDER — POTASSIUM CHLORIDE CRYS ER 20 MEQ PO TBCR
40.0000 meq | EXTENDED_RELEASE_TABLET | Freq: Once | ORAL | Status: AC
  Administered 2017-12-29: 40 meq via ORAL
  Filled 2017-12-29: qty 2

## 2017-12-29 MED ORDER — OXYCODONE-ACETAMINOPHEN 7.5-325 MG PO TABS
1.0000 | ORAL_TABLET | ORAL | Status: DC | PRN
  Administered 2017-12-30 – 2018-01-09 (×30): 1 via ORAL
  Filled 2017-12-29 (×31): qty 1

## 2017-12-29 MED ORDER — ALBUTEROL SULFATE (2.5 MG/3ML) 0.083% IN NEBU: 2.5000 mg | INHALATION_SOLUTION | RESPIRATORY_TRACT | Status: DC | PRN

## 2017-12-29 MED ORDER — LOPERAMIDE HCL 2 MG PO CAPS
4.0000 mg | ORAL_CAPSULE | Freq: Once | ORAL | Status: AC
  Administered 2017-12-29: 4 mg via ORAL
  Filled 2017-12-29: qty 2

## 2017-12-29 MED ORDER — LOPERAMIDE HCL 2 MG PO CAPS
2.0000 mg | ORAL_CAPSULE | ORAL | Status: DC | PRN
  Administered 2017-12-30: 2 mg via ORAL
  Filled 2017-12-29: qty 1

## 2017-12-29 MED ORDER — IPRATROPIUM-ALBUTEROL 0.5-2.5 (3) MG/3ML IN SOLN
3.0000 mL | Freq: Three times a day (TID) | RESPIRATORY_TRACT | Status: DC
  Administered 2017-12-29: 3 mL via RESPIRATORY_TRACT
  Filled 2017-12-29: qty 3

## 2017-12-29 MED ORDER — PANTOPRAZOLE SODIUM 40 MG PO TBEC
40.0000 mg | DELAYED_RELEASE_TABLET | Freq: Every day | ORAL | Status: DC
  Administered 2017-12-30 – 2018-01-09 (×11): 40 mg via ORAL
  Filled 2017-12-29 (×12): qty 1

## 2017-12-29 NOTE — Progress Notes (Signed)
[place] TEAM 1 - Stepdown/ICU TEAM  Joseph [name]  ZOX:[id number] DOB: 1950-12-19 DOA: 12/27/2017 PCP: Lennart Pall, MD    Brief Narrative:  67 y.o. male with a hx of hypertension, hyperlipidemia, COPD, chronic respiratory failure on 3 L [place], GERD, CAD, gastroparesis, psoriasis, rheumatoid arthritis, pancytopenia, and diastolic CHF who presented with diarrhea and fever.  He was hospitalized 4/29-5/3 due to [facility] discharged home on Augmentin.  He developed severe diarrhea the following day w/ more than 20 watery bowel movements and a fever of 102.   ED w/u revealed WBC 4.5, lactic acid 1.58, negative urinalysis, negative C. difficile antigen toxin, temperature 102.8, hypotensive with blood pressure 87/59, tachypnea, oxygen saturation 100% on 3 L nasal cannula oxygen.  Chest x-ray showed known left upper lobe thick-walled cavitary lesion with increased size and increased surrounding airspace consolidation.    Significant Events:   Subjective: The patient appears to be comfortable resting in bed at this time.  He reports ongoing watery diarrhea with now significant improvement since his admission.  He denies chest pain shortness of breath fever chills nausea or vomiting but does admit to very poor appetite.  I have spoken at great length with the patient and his wife at the bedside.  Assessment & Plan:  SIRS v/s Sepsis of unclear etiology  May simply be SIRS related to profuse watery diarrhea and profound dehydration  Diarrhea C. difficile antigen and toxin negative - GI panel negative -possibly antibiotic associated -stop all antibiotics -initiate Imodium -follow clinically  HCAP / infected pulmonary bulla - Hospitalized 4/29 > 5/3 Clinically the patient appears much improved from a respiratory standpoint -he asked that his pulmonary doctor be involved in his care  Chronic respiratory failure with hypoxiaand COPD Appears stable in this regard presently -continue usual home  medications  Neutropenia F/u diff in AM   Hypokalemia Due to GI loss - replace and follow   Type 2 diabetes, controlled, with neuropathy A1c6.1 08/15/17 -does not require use of diabetes medicines as an outpatient -follow CBG  Essential hypertension Not an active problem at this time due to significant volume depletion -holding blood pressure medications  HLD Continue home medical therapy  CAD Presently asymptomatic -continue home medical therapy  GERD Lower dose of PPI and follow  Rheumatoid arthritis Continue home dose of prednisone with no indication for stress dosing at this time  Chronic diastolic heart failure TTE 08/15/2017 noted EF 55-60% with grade 1 diastolic dysfunction -holding diuretic due to significant dehydration in face of diarrhea  DVT prophylaxis: Lovenox Code Status: FULL CODE Family Communication: no family present at time of exam  Disposition Plan: SDU  Consultants:  None  Antimicrobials:  None  Objective: Blood pressure 131/83, pulse 80, temperature 98.1 F (36.7 C), temperature source Oral, resp. rate 11, height 6\' 2"  (1.88 m), weight 96.6 kg (213 lb), SpO2 97 %.  Intake/Output Summary (Last 24 hours) at 12/29/2017 1617 Last data filed at 12/29/2017 1611 Gross per 24 hour  Intake 2700 ml  Output 1110 ml  Net 1590 ml   Filed Weights   12/27/17 1725  Weight: 96.6 kg (213 lb)    Examination: General: No acute respiratory distress Lungs: Clear to auscultation bilaterally without wheezes or crackles Cardiovascular: Regular rate and rhythm without murmur gallop or rub normal S1 and S2 Abdomen: Nontender, overweights, soft, bowel sounds positive, no rebound, no ascites, no appreciable mass Extremities: No significant cyanosis, clubbing, or edema bilateral lower extremities  CBC: Recent Labs  Lab  12/25/17 7782 12/26/17 0612 12/27/17 1721 12/28/17 0331  WBC 2.1* 3.0* 4.5 2.3*  NEUTROABS 0.6* 1.1* 1.3*  --   HGB 11.2* 11.2* 13.1  11.5*  HCT 34.3* 33.7* 39.0 34.8*  MCV 87.3 86.9 85.2 86.4  PLT 131* 133* 183 156   Basic Metabolic Panel: Recent Labs  Lab 12/26/17 0612 12/27/17 1721 12/28/17 0331  NA 140 135 135  K 3.5 3.2* 3.3*  CL 107 103 109  CO2 23 20* 16*  GLUCOSE 100* 147* 146*  BUN 14 10 10   CREATININE 0.51* 0.80 0.72  CALCIUM 9.0 9.2 8.3*   GFR: Estimated Creatinine Clearance: 107 mL/min (by C-G formula based on SCr of 0.72 mg/dL).  Liver Function Tests: Recent Labs  Lab 12/27/17 1721  AST 32  ALT 37  ALKPHOS 76  BILITOT 1.1  PROT 7.2  ALBUMIN 3.1*    HbA1C: Hgb A1c MFr Bld  Date/Time Value Ref Range Status  08/15/2017 05:17 AM 6.1 (H) 4.8 - 5.6 % Final    Comment:    (NOTE) Pre diabetes:          5.7%-6.4% Diabetes:              >6.4% Glycemic control for   <7.0% adults with diabetes   06/24/2017 10:42 AM 6.5 4.6 - 6.5 % Final    Comment:    Glycemic Control Guidelines for People with Diabetes:Non Diabetic:  <6%Goal of Therapy: <7%Additional Action Suggested:  >8%     CBG: Recent Labs  Lab 12/26/17 0727 12/26/17 1214 12/28/17 1016 12/28/17 1811 12/29/17 0832  GLUCAP 95 120* 147* 157* 103*    Recent Results (from the past 240 hour(s))  Blood Culture (routine x 2)     Status: None   Collection Time: 12/22/17 10:46 AM  Result Value Ref Range Status   Specimen Description   Final    BLOOD LEFT ARM Performed at [facility], [address] [place], [place]    Special Requests   Final    BOTTLES DRAWN AEROBIC AND ANAEROBIC Blood Culture adequate volume Performed at [facility], [address] [place], [place]    Culture   Final    NO GROWTH 5 DAYS Performed at [facility], [address] [place], [place]    Report Status 12/27/2017 FINAL  Final  Blood Culture (routine x 2)     Status: None   Collection Time: 12/22/17 10:55 AM  Result Value Ref Range Status   Specimen Description   Final    BLOOD  LEFT HAND Performed at [facility], [address] [place], [place]    Special Requests   Final    BOTTLES DRAWN AEROBIC AND ANAEROBIC Blood Culture adequate volume Performed at [facility] [place] [facility], [address] [place], [place]    Culture   Final    NO GROWTH 5 DAYS Performed at [facility], [address], [place], [place]    Report Status 12/27/2017 FINAL  Final  MRSA PCR Screening     Status: None   Collection Time: 12/22/17  8:41 PM  Result Value Ref Range Status   MRSA by PCR NEGATIVE NEGATIVE Final    Comment:        The GeneXpert MRSA Assay (FDA approved for NASAL specimens only), is one component of a comprehensive MRSA colonization surveillance program. It is not intended to  diagnose MRSA infection nor to guide or monitor treatment for MRSA infections. Performed at [facility], [address] [place], [place] [address]   Culture, sputum-assessment     Status: None   Collection Time: 12/23/17  1:10 AM  Result Value Ref Range Status   Specimen Description SPUTUM  Final   Special Requests NONE  Final   Sputum evaluation   Final    THIS SPECIMEN IS ACCEPTABLE FOR SPUTUM CULTURE Performed at [facility], [address] [place], [place] [address]    Report Status 12/23/2017 FINAL  Final  Culture, respiratory (NON-Expectorated)     Status: None   Collection Time: 12/23/17  1:10 AM  Result Value Ref Range Status   Specimen Description   Final    SPUTUM Performed at [facility], [address], [place], [place] [address]    Special Requests   Final    NONE Reflexed from [id number] Performed at [facility], [address], [place], [place] [address]    Gram Stain   Final    FEW WBC PRESENT, PREDOMINANTLY PMN RARE GRAM POSITIVE COCCI    Culture   Final    FEW Consistent with normal respiratory flora. Performed at [facility], [address], [place], [place] [address]    Report Status 12/25/2017 FINAL  Final  Blood Culture (routine x 2)     Status: None (Preliminary result)   Collection Time: 12/27/17  5:21 PM  Result Value Ref Range Status   Specimen Description BLOOD LEFT FOREARM  Final   Special Requests   Final    BOTTLES DRAWN AEROBIC AND ANAEROBIC Blood Culture adequate volume   Culture   Final    NO GROWTH 2 DAYS Performed at [facility], [address], [place], [place] [address]    Report Status PENDING  Incomplete  Blood Culture (routine x 2)     Status: None (Preliminary result)   Collection Time: 12/27/17  5:22 PM  Result Value Ref Range Status   Specimen Description BLOOD RIGHT WRIST  Final   Special Requests   Final    BOTTLES DRAWN AEROBIC AND ANAEROBIC Blood Culture adequate volume   Culture   Final    NO GROWTH 2 DAYS Performed at [facility], [address], [place], [place] [address]    Report Status PENDING  Incomplete  C difficile quick scan w PCR reflex     Status: None   Collection Time: 12/27/17  8:10 PM  Result Value Ref Range Status   C Diff antigen NEGATIVE NEGATIVE Final   C Diff toxin NEGATIVE NEGATIVE Final   C Diff interpretation No C. difficile detected.  Final    Comment: Performed at [facility], [address], [place], [place] [address]  Gastrointestinal Panel by PCR , Stool     Status: None   Collection Time: 12/27/17  8:10 PM  Result Value Ref Range Status   Campylobacter species NOT DETECTED NOT DETECTED Final   Plesimonas shigelloides NOT DETECTED NOT DETECTED Final   Salmonella species NOT DETECTED NOT DETECTED Final   Yersinia enterocolitica NOT DETECTED NOT DETECTED Final   Vibrio species NOT DETECTED NOT DETECTED Final   Vibrio cholerae NOT DETECTED NOT DETECTED Final   Enteroaggregative E coli (EAEC) NOT DETECTED NOT DETECTED Final   Enteropathogenic E coli (EPEC) NOT DETECTED NOT DETECTED Final   Enterotoxigenic E coli  (ETEC) NOT  DETECTED NOT DETECTED Final   Shiga like toxin producing E coli (STEC) NOT DETECTED NOT DETECTED Final   Shigella/Enteroinvasive E coli (EIEC) NOT DETECTED NOT DETECTED Final   Cryptosporidium NOT DETECTED NOT DETECTED Final   Cyclospora cayetanensis NOT DETECTED NOT DETECTED Final   Entamoeba histolytica NOT DETECTED NOT DETECTED Final   Giardia lamblia NOT DETECTED NOT DETECTED Final   Adenovirus F40/41 NOT DETECTED NOT DETECTED Final   Astrovirus NOT DETECTED NOT DETECTED Final   Norovirus GI/GII NOT DETECTED NOT DETECTED Final   Rotavirus A NOT DETECTED NOT DETECTED Final   Sapovirus (I, II, IV, and V) NOT DETECTED NOT DETECTED Final    Comment: Performed at [facility], [address] [place], [place] [address]  MRSA PCR Screening     Status: None   Collection Time: 12/28/17  5:27 PM  Result Value Ref Range Status   MRSA by PCR NEGATIVE NEGATIVE Final    Comment:        The GeneXpert MRSA Assay (FDA approved for NASAL specimens only), is one component of a comprehensive MRSA colonization surveillance program. It is not intended to diagnose MRSA infection nor to guide or monitor treatment for MRSA infections. Performed at [facility], [address], [place], [place] [address]      Scheduled Meds: . acetaminophen  1,000 mg Oral Once  . aspirin EC  81 mg Oral Daily  . atorvastatin  80 mg Oral QHS  . enoxaparin (LOVENOX) injection  40 mg Subcutaneous Q24H  . ferrous sulfate  325 mg Oral Q breakfast  . fluticasone furoate-vilanterol  1 puff Inhalation Daily  . folic acid  1,000 mcg Oral Daily  . loteprednol  1 drop Both Eyes q morning - 10a  . multivitamin  1 tablet Oral Daily  . pantoprazole  40 mg Oral BID  . predniSONE  15 mg Oral Q breakfast  . pregabalin  75 mg Oral BID  . umeclidinium bromide  1 puff Inhalation Daily  . vitamin C  500 mg Oral Daily     LOS: 1 day   Lonia Blood, MD [facility] Office   [phone] Pager - Text Page per Amion as per below:  On-Call/Text Page:      [email or web address]      password [email or web address]  If 7PM-7AM, please contact night-coverage [email or web address] Password [email or web address] 12/29/2017, 4:17 PM

## 2017-12-30 LAB — CBC WITH DIFFERENTIAL/PLATELET
BASOS ABS: 0 10*3/uL (ref 0.0–0.1)
Basophils Relative: 0 %
EOS PCT: 2 %
Eosinophils Absolute: 0.1 10*3/uL (ref 0.0–0.7)
HEMATOCRIT: 33 % — AB (ref 39.0–52.0)
Hemoglobin: 10.9 g/dL — ABNORMAL LOW (ref 13.0–17.0)
Lymphocytes Relative: 59 %
Lymphs Abs: 1.4 10*3/uL (ref 0.7–4.0)
MCH: 28.6 pg (ref 26.0–34.0)
MCHC: 33 g/dL (ref 30.0–36.0)
MCV: 86.6 fL (ref 78.0–100.0)
MONOS PCT: 15 %
Monocytes Absolute: 0.4 10*3/uL (ref 0.1–1.0)
Neutro Abs: 0.6 10*3/uL — ABNORMAL LOW (ref 1.7–7.7)
Neutrophils Relative %: 24 %
PLATELETS: 181 10*3/uL (ref 150–400)
RBC: 3.81 MIL/uL — ABNORMAL LOW (ref 4.22–5.81)
RDW: 16.6 % — AB (ref 11.5–15.5)
WBC: 2.5 10*3/uL — AB (ref 4.0–10.5)

## 2017-12-30 LAB — COMPREHENSIVE METABOLIC PANEL
ALT: 22 U/L (ref 17–63)
AST: 25 U/L (ref 15–41)
Albumin: 2.5 g/dL — ABNORMAL LOW (ref 3.5–5.0)
Alkaline Phosphatase: 53 U/L (ref 38–126)
Anion gap: 8 (ref 5–15)
BILIRUBIN TOTAL: 0.9 mg/dL (ref 0.3–1.2)
BUN: 8 mg/dL (ref 6–20)
CHLORIDE: 111 mmol/L (ref 101–111)
CO2: 24 mmol/L (ref 22–32)
Calcium: 8.4 mg/dL — ABNORMAL LOW (ref 8.9–10.3)
Creatinine, Ser: 0.78 mg/dL (ref 0.61–1.24)
GFR calc Af Amer: >60 mL/min (ref >60)
Glucose, Bld: 111 mg/dL — ABNORMAL HIGH (ref 65–99)
POTASSIUM: 2.9 mmol/L — AB (ref 3.5–5.1)
Sodium: 143 mmol/L (ref 135–145)
TOTAL PROTEIN: 5.5 g/dL — AB (ref 6.5–8.1)

## 2017-12-30 LAB — URINALYSIS, ROUTINE W REFLEX MICROSCOPIC
Bilirubin Urine: NEGATIVE
GLUCOSE, UA: NEGATIVE mg/dL
KETONES UR: NEGATIVE mg/dL
LEUKOCYTES UA: NEGATIVE
Nitrite: NEGATIVE
PH: 6 (ref 5.0–8.0)
Protein, ur: NEGATIVE mg/dL
SPECIFIC GRAVITY, URINE: 1.011 (ref 1.005–1.030)

## 2017-12-30 LAB — MAGNESIUM: Magnesium: 1.6 mg/dL — ABNORMAL LOW (ref 1.7–2.4)

## 2017-12-30 LAB — PHOSPHORUS: Phosphorus: 2.4 mg/dL — ABNORMAL LOW (ref 2.5–4.6)

## 2017-12-30 LAB — GLUCOSE, CAPILLARY: Glucose-Capillary: 112 mg/dL — ABNORMAL HIGH (ref 65–99)

## 2017-12-30 MED ORDER — POTASSIUM CHLORIDE 10 MEQ/100ML IV SOLN
10.0000 meq | INTRAVENOUS | Status: AC
  Administered 2017-12-30 (×3): 10 meq via INTRAVENOUS
  Filled 2017-12-30 (×3): qty 100

## 2017-12-30 MED ORDER — MAGNESIUM SULFATE 2 GM/50ML IV SOLN
2.0000 g | Freq: Once | INTRAVENOUS | Status: AC
  Administered 2017-12-30: 2 g via INTRAVENOUS
  Filled 2017-12-30: qty 50

## 2017-12-30 MED ORDER — MORPHINE SULFATE (PF) 4 MG/ML IV SOLN
4.0000 mg | Freq: Once | INTRAVENOUS | Status: DC | PRN
  Filled 2017-12-30: qty 1

## 2017-12-30 NOTE — Progress Notes (Addendum)
[facility] TEAM 1 - Stepdown/ICU TEAM  Joseph [name]  WOE:[id number] DOB: 12-22-1950 DOA: 12/27/2017 PCP: Lennart Pall, MD    Brief Narrative:  67 y.o. male with a hx of hypertension, hyperlipidemia, COPD, chronic respiratory failure on 3 L [place], GERD, CAD, gastroparesis, psoriasis, rheumatoid arthritis, pancytopenia, and diastolic CHF who presented with diarrhea and fever.  He was hospitalized 4/29-5/3 due to [facility] discharged home on Augmentin.  He developed severe diarrhea the following day w/ more than 20 watery bowel movements and a fever of 102.   ED w/u revealed WBC 4.5, lactic acid 1.58, negative urinalysis, negative C. difficile antigen toxin, temperature 102.8, hypotensive with blood pressure 87/59, tachypnea, oxygen saturation 100% on 3 L nasal cannula oxygen.  Chest x-ray showed known left upper lobe thick-walled cavitary lesion with increased size and increased surrounding airspace consolidation.    Subjective: Feeling better today.  Diarrhea has not stopped, but has decreased signif in frequency and volume.  Denies cp, sob, vomiting.  Some low grade intermittent nausea.    Assessment & Plan:  SIRS v/s Sepsis of unclear etiology  Now appears most c/w SIRS related to profuse watery diarrhea and profound dehydration - SIRS physiology has resolved w/ simple volume expansion   Diarrhea C. difficile antigen and toxin negative - GI panel negative -possibly antibiotic associated -stopped all antibiotics -initiated Imodium - slowly improving   HCAP / infected pulmonary bulla - Hospitalized 4/29 > 5/3 Clinically the patient appears much improved from a respiratory standpoint - he asked that his Pulmonary doctor be involved in his care, therefore we will request a consult 12/31/17  Chronic respiratory failure with hypoxiaand COPD Stable w/o acute exacerbation - continue usual home medications  Neutropenia ANC 0.6 - follow trend - WBC slightly improved today - due to acute illness  as well as tx for RA  Hypokalemia Due to GI loss - cont to replace and follow   Hypomagnesemia  Replace and f/u in AM  Type 2 diabetes, controlled, with neuropathy A1c6.1 08/15/17 -does not require use of diabetes medicines as an outpatient - CBG controlled  Essential hypertension BP remains stable w/o medical tx at this time   HLD Continue home medical therapy   CAD Presently asymptomatic -continue home medical therapy  GERD Cont lower dose of PPI   Rheumatoid arthritis Continue home dose of prednisone with no indication for stress dosing at this time  Chronic diastolic heart failure TTE 08/15/2017 noted EF 55-60% with grade 1 diastolic dysfunction -holding diuretic due to significant dehydration in face of diarrhea - no signif volume overload on exam today   Filed Weights   12/27/17 1725  Weight: 96.6 kg (213 lb)     DVT prophylaxis: Lovenox Code Status: FULL CODE Family Communication: no family present at time of exam  Disposition Plan: SDU  Consultants:  None  Antimicrobials:  None  Objective: Blood pressure 122/70, pulse 70, temperature 98 F (36.7 C), temperature source Oral, resp. rate 18, height 6\' 2"  (1.88 m), weight 96.6 kg (213 lb), SpO2 97 %.  Intake/Output Summary (Last 24 hours) at 12/30/2017 1658 Last data filed at 12/30/2017 1235 Gross per 24 hour  Intake 1927.92 ml  Output 2125 ml  Net -197.08 ml   Filed Weights   12/27/17 1725  Weight: 96.6 kg (213 lb)    Examination: General: No acute respiratory distress Lungs: Clear to auscultation bilaterally w/o wheezing  Cardiovascular: RRR - no M  Abdomen: Nontender, overweight, soft, bowel sounds positive, no  rebound Extremities: No signif edema B LE   CBC: Recent Labs  Lab 12/26/17 0612 12/27/17 1721 12/28/17 0331 12/30/17 0715  WBC 3.0* 4.5 2.3* 2.5*  NEUTROABS 1.1* 1.3*  --  0.6*  HGB 11.2* 13.1 11.5* 10.9*  HCT 33.7* 39.0 34.8* 33.0*  MCV 86.9 85.2 86.4 86.6  PLT 133*  183 156 181   Basic Metabolic Panel: Recent Labs  Lab 12/27/17 1721 12/28/17 0331 12/30/17 0715  NA 135 135 143  K 3.2* 3.3* 2.9*  CL 103 109 111  CO2 20* 16* 24  GLUCOSE 147* 146* 111*  BUN 10 10 8   CREATININE 0.80 0.72 0.78  CALCIUM 9.2 8.3* 8.4*  MG  --   --  1.6*  PHOS  --   --  2.4*   GFR: Estimated Creatinine Clearance: 107 mL/min (by C-G formula based on SCr of 0.78 mg/dL).  Liver Function Tests: Recent Labs  Lab 12/27/17 1721 12/30/17 0715  AST 32 25  ALT 37 22  ALKPHOS 76 53  BILITOT 1.1 0.9  PROT 7.2 5.5*  ALBUMIN 3.1* 2.5*    HbA1C: Hgb A1c MFr Bld  Date/Time Value Ref Range Status  08/15/2017 05:17 AM 6.1 (H) 4.8 - 5.6 % Final    Comment:    (NOTE) Pre diabetes:          5.7%-6.4% Diabetes:              >6.4% Glycemic control for   <7.0% adults with diabetes   06/24/2017 10:42 AM 6.5 4.6 - 6.5 % Final    Comment:    Glycemic Control Guidelines for People with Diabetes:Non Diabetic:  <6%Goal of Therapy: <7%Additional Action Suggested:  >8%     CBG: Recent Labs  Lab 12/26/17 1214 12/28/17 1016 12/28/17 1811 12/29/17 0832 12/30/17 0801  GLUCAP 120* 147* 157* 103* 112*    Recent Results (from the past 240 hour(s))  Blood Culture (routine x 2)     Status: None   Collection Time: 12/22/17 10:46 AM  Result Value Ref Range Status   Specimen Description   Final    BLOOD LEFT ARM Performed at [facility], [address] [place], [place]    Special Requests   Final    BOTTLES DRAWN AEROBIC AND ANAEROBIC Blood Culture adequate volume Performed at [facility], [address] [place], [place]    Culture   Final    NO GROWTH 5 DAYS Performed at [facility], [address], [place], [place]    Report Status 12/27/2017 FINAL  Final  Blood Culture (routine x 2)     Status: None   Collection Time: 12/22/17 10:55 AM  Result Value Ref Range Status   Specimen Description   Final     BLOOD LEFT HAND Performed at [facility], [address] [place], [place]    Special Requests   Final    BOTTLES DRAWN AEROBIC AND ANAEROBIC Blood Culture adequate volume Performed at [facility], [address] [place], [place]    Culture   Final    NO GROWTH 5 DAYS Performed at [facility], [address] [place], [place]    Report Status 12/27/2017 FINAL  Final  MRSA PCR Screening     Status: None   Collection Time: 12/22/17  8:41 PM  Result Value Ref Range Status   MRSA by  PCR NEGATIVE NEGATIVE Final    Comment:        The GeneXpert MRSA Assay (FDA approved for NASAL specimens only), is one component of a comprehensive MRSA colonization surveillance program. It is not intended to diagnose MRSA infection nor to guide or monitor treatment for MRSA infections. Performed at [facility], [address] [place], [place] [address]   Culture, sputum-assessment     Status: None   Collection Time: 12/23/17  1:10 AM  Result Value Ref Range Status   Specimen Description SPUTUM  Final   Special Requests NONE  Final   Sputum evaluation   Final    THIS SPECIMEN IS ACCEPTABLE FOR SPUTUM CULTURE Performed at [facility], [address], [place], [place] [address]    Report Status 12/23/2017 FINAL  Final  Culture, respiratory (NON-Expectorated)     Status: None   Collection Time: 12/23/17  1:10 AM  Result Value Ref Range Status   Specimen Description   Final    SPUTUM Performed at [facility], [address], [place], [place] [address]    Special Requests   Final    NONE Reflexed from [id number] Performed at [facility], [address] [place], [place] [address]    Gram Stain   Final    FEW WBC PRESENT, PREDOMINANTLY PMN RARE GRAM POSITIVE COCCI    Culture   Final    FEW Consistent with normal respiratory flora. Performed at [facility], [address], [place], [place] [address]    Report Status 12/25/2017 FINAL  Final  Blood Culture (routine x 2)     Status: None (Preliminary result)   Collection Time: 12/27/17  5:21 PM  Result Value Ref Range Status   Specimen Description BLOOD LEFT FOREARM  Final   Special Requests   Final    BOTTLES DRAWN AEROBIC AND ANAEROBIC Blood Culture adequate volume   Culture   Final    NO GROWTH 3 DAYS Performed at [facility], [address], [place], [place] [address]    Report Status PENDING  Incomplete  Blood Culture (routine x 2)     Status: None (Preliminary result)   Collection Time: 12/27/17  5:22 PM  Result Value Ref Range Status   Specimen Description BLOOD RIGHT WRIST  Final   Special Requests   Final    BOTTLES DRAWN AEROBIC AND ANAEROBIC Blood Culture adequate volume   Culture   Final    NO GROWTH 3 DAYS Performed at [facility], [address], [place], [place] [address]    Report Status PENDING  Incomplete  C difficile quick scan w PCR reflex     Status: None   Collection Time: 12/27/17  8:10 PM  Result Value Ref Range Status   C Diff antigen NEGATIVE NEGATIVE Final   C Diff toxin NEGATIVE NEGATIVE Final   C Diff interpretation No C. difficile detected.  Final    Comment: Performed at [facility], [address] [place], [place] [address]  Gastrointestinal Panel by PCR , Stool     Status: None   Collection Time: 12/27/17  8:10 PM  Result Value Ref Range Status   Campylobacter species NOT DETECTED NOT DETECTED Final   Plesimonas shigelloides NOT DETECTED NOT DETECTED Final   Salmonella species NOT DETECTED NOT DETECTED Final   Yersinia enterocolitica NOT DETECTED NOT DETECTED Final   Vibrio species NOT DETECTED NOT DETECTED  Final   Vibrio cholerae NOT DETECTED NOT DETECTED Final   Enteroaggregative E coli (EAEC) NOT DETECTED NOT DETECTED Final   Enteropathogenic E coli (EPEC) NOT DETECTED NOT DETECTED Final   Enterotoxigenic E coli  (ETEC) NOT DETECTED NOT DETECTED Final   Shiga like toxin producing E coli (STEC) NOT DETECTED NOT DETECTED Final   Shigella/Enteroinvasive E coli (EIEC) NOT DETECTED NOT DETECTED Final   Cryptosporidium NOT DETECTED NOT DETECTED Final   Cyclospora cayetanensis NOT DETECTED NOT DETECTED Final   Entamoeba histolytica NOT DETECTED NOT DETECTED Final   Giardia lamblia NOT DETECTED NOT DETECTED Final   Adenovirus F40/41 NOT DETECTED NOT DETECTED Final   Astrovirus NOT DETECTED NOT DETECTED Final   Norovirus GI/GII NOT DETECTED NOT DETECTED Final   Rotavirus A NOT DETECTED NOT DETECTED Final   Sapovirus (I, II, IV, and V) NOT DETECTED NOT DETECTED Final    Comment: Performed at [facility], [address] [place], [place] [address]  MRSA PCR Screening     Status: None   Collection Time: 12/28/17  5:27 PM  Result Value Ref Range Status   MRSA by PCR NEGATIVE NEGATIVE Final    Comment:        The GeneXpert MRSA Assay (FDA approved for NASAL specimens only), is one component of a comprehensive MRSA colonization surveillance program. It is not intended to diagnose MRSA infection nor to guide or monitor treatment for MRSA infections. Performed at [facility], [address] [place], [place] [address]      Scheduled Meds: . aspirin EC  81 mg Oral Daily  . atorvastatin  80 mg Oral QHS  . enoxaparin (LOVENOX) injection  40 mg Subcutaneous Q24H  . ferrous sulfate  325 mg Oral Q breakfast  . fluticasone furoate-vilanterol  1 puff Inhalation Daily  . folic acid  1,000 mcg Oral Daily  . loteprednol  1 drop Both Eyes q morning - 10a  . multivitamin  1 tablet Oral Daily  . pantoprazole  40 mg Oral Daily  . predniSONE  15 mg Oral Q breakfast  . pregabalin  75 mg Oral BID  . umeclidinium bromide  1 puff Inhalation Daily  . vitamin C  500 mg Oral Daily     LOS: 2 days   Lonia Blood, MD [facility] Office  [phone] Pager - Text Page per Amion as per  below:  On-Call/Text Page:      [email or web address]      password [email or web address]  If 7PM-7AM, please contact night-coverage [email or web address] Password [facility] 12/30/2017, 4:58 PM

## 2017-12-30 NOTE — Plan of Care (Signed)
Responded well to the pain med

## 2017-12-30 NOTE — Progress Notes (Signed)
Pt c/o sudden severe pain at the pubic pain gave him PRN pain med on call physician notified gave an order for iv morphine,  Pt refused per pt morphine dont work for him, on call notified will continue to monitor

## 2017-12-31 DIAGNOSIS — R509 Fever, unspecified: Secondary | ICD-10-CM

## 2017-12-31 LAB — RHEUMATOID FACTOR: RHEUMATOID FACTOR: 120.8 [IU]/mL — AB (ref 0.0–13.9)

## 2017-12-31 LAB — COMPREHENSIVE METABOLIC PANEL
ALBUMIN: 2.4 g/dL — AB (ref 3.5–5.0)
ALT: 19 U/L (ref 17–63)
AST: 21 U/L (ref 15–41)
Alkaline Phosphatase: 50 U/L (ref 38–126)
Anion gap: 8 (ref 5–15)
BILIRUBIN TOTAL: 0.7 mg/dL (ref 0.3–1.2)
BUN: 5 mg/dL — AB (ref 6–20)
CHLORIDE: 110 mmol/L (ref 101–111)
CO2: 25 mmol/L (ref 22–32)
CREATININE: 0.56 mg/dL — AB (ref 0.61–1.24)
Calcium: 8.1 mg/dL — ABNORMAL LOW (ref 8.9–10.3)
GFR calc Af Amer: >60 mL/min (ref >60)
GFR calc non Af Amer: >60 mL/min (ref >60)
GLUCOSE: 112 mg/dL — AB (ref 65–99)
POTASSIUM: 2.9 mmol/L — AB (ref 3.5–5.1)
Sodium: 143 mmol/L (ref 135–145)
TOTAL PROTEIN: 5.1 g/dL — AB (ref 6.5–8.1)

## 2017-12-31 LAB — CBC WITH DIFFERENTIAL/PLATELET
Basophils Absolute: 0 10*3/uL (ref 0.0–0.1)
Basophils Relative: 0 %
EOS PCT: 1 %
Eosinophils Absolute: 0 10*3/uL (ref 0.0–0.7)
HEMATOCRIT: 29.6 % — AB (ref 39.0–52.0)
Hemoglobin: 9.7 g/dL — ABNORMAL LOW (ref 13.0–17.0)
Lymphocytes Relative: 60 %
Lymphs Abs: 1.7 10*3/uL (ref 0.7–4.0)
MCH: 27.9 pg (ref 26.0–34.0)
MCHC: 32.8 g/dL (ref 30.0–36.0)
MCV: 85.1 fL (ref 78.0–100.0)
MONO ABS: 0.4 10*3/uL (ref 0.1–1.0)
MONOS PCT: 15 %
NEUTROS ABS: 0.6 10*3/uL — AB (ref 1.7–7.7)
Neutrophils Relative %: 24 %
PLATELETS: 174 10*3/uL (ref 150–400)
RBC: 3.48 MIL/uL — ABNORMAL LOW (ref 4.22–5.81)
RDW: 16.3 % — AB (ref 11.5–15.5)
WBC: 2.7 10*3/uL — ABNORMAL LOW (ref 4.0–10.5)

## 2017-12-31 LAB — EXPECTORATED SPUTUM ASSESSMENT W GRAM STAIN, RFLX TO RESP C

## 2017-12-31 LAB — MAGNESIUM: Magnesium: 1.9 mg/dL (ref 1.7–2.4)

## 2017-12-31 LAB — POTASSIUM: Potassium: 4.1 mmol/L (ref 3.5–5.1)

## 2017-12-31 LAB — PATHOLOGIST SMEAR REVIEW

## 2017-12-31 MED ORDER — POTASSIUM CHLORIDE CRYS ER 20 MEQ PO TBCR
40.0000 meq | EXTENDED_RELEASE_TABLET | Freq: Once | ORAL | Status: AC
  Administered 2017-12-31: 40 meq via ORAL
  Filled 2017-12-31: qty 2

## 2017-12-31 MED ORDER — POTASSIUM CHLORIDE CRYS ER 20 MEQ PO TBCR
40.0000 meq | EXTENDED_RELEASE_TABLET | ORAL | Status: AC
  Administered 2017-12-31: 40 meq via ORAL
  Filled 2017-12-31 (×2): qty 2

## 2017-12-31 MED ORDER — MELATONIN 3 MG PO TABS
9.0000 mg | ORAL_TABLET | Freq: Every evening | ORAL | Status: DC | PRN
  Administered 2017-12-31 – 2018-01-08 (×7): 9 mg via ORAL
  Filled 2017-12-31 (×7): qty 3

## 2017-12-31 NOTE — Care Management Note (Addendum)
Case Management Note  Patient Details  Name: ROSALIO CATTERTON MRN: [id number] Date of Birth: [birth year]  Subjective/Objective:   Admitted   SIRS v/s Sepsis of unclear etiology, hx ofhypertension, hyperlipidemia, COPD, chronic respiratory failure on 3 L [place], GERD, CAD, gastroparesis, psoriasis, rheumatoid arthritis, pancytopenia,and diastolic. Multi admits.Resides with wife. Wife states PTA active with [facility], RN,PT.       Tyaire Odem (Spouse)     [phone]          PCP: Sharyn Dross  Action/Plan: Transition to home with home health services when medically stable. Pt has own portable oxygen concentrator for transportation to home once d/c. Pt requesting DME: light weight transport wheelchair. MD HAS TO PLACE ORDER, AND NARRATIVE has to be placed by MD or PT in order to obtain approval by insurance for wheelchair. NCM following for disposition needs...   Expected Discharge Date:                  Expected Discharge Plan:  Home w Home Health Services  In-House Referral:     Discharge planning Services  CM Consult  Post Acute Care Choice:  Resumption of Svcs/PTA Provider, Home Health Choice offered to:  Patient  DME Arranged:    DME Agency:     HH Arranged:  PT, RN HH Agency:  [facility]  Status of Service:  In process, will continue to follow  If discussed at Long Length of Stay Meetings, dates discussed:    Additional Comments:  Epifanio Lesches, RN 12/31/2017, 12:04 PM

## 2017-12-31 NOTE — Progress Notes (Signed)
Patient suffers from limited mobility  which impairs their ability to perform daily activities like ambulation, ADLs in the home and community.  A walker alone will not resolve the issues with performing activities of daily living/ambulation. A lightweight transport wheelchair will allow patient to safely perform daily activities.  The patient can self propel in the home or has a caregiver who can provide assistance.     Gladys Damme, PT, DPT  Acute Rehabilitation Services  Pager: [phone]

## 2017-12-31 NOTE — Evaluation (Signed)
Physical Therapy Evaluation Patient Details Name: Joseph [name] MRN: [id number] DOB: [birth year] Today's Date: 12/31/2017   History of Present Illness  Pt is a 67 y/o male admitted secondary to fever and diarrhea. Pt with recent admission to [facility] for pneumonia. PMH includes DM, HTN, CAD, dCHF, COPD on 3L of oxygen at home, and RA.   Clinical Impression  Pt admitted secondary to problem above with deficits below. Pt ambulated with bilat platform RW with min guard A, however, distance limited secondary to fatigue. Pt reports current lightweight WC is very old, and not working properly, so recommend new lightweight transport WC for longer distance mobility, as pt will not be able to tolerate. Will continue to follow acutely to maximize functional mobility independence and safety.     Follow Up Recommendations Supervision/Assistance - 24 hour;Home health PT    Equipment Recommendations  Wheelchair (measurements PT);Wheelchair cushion (measurements PT)(lightweight transport WC for longer distance mobility )    Recommendations for Other Services OT consult     Precautions / Restrictions Precautions Precautions: Fall Restrictions Weight Bearing Restrictions: No      Mobility  Bed Mobility               General bed mobility comments: In chair upon entry.   Transfers Overall transfer level: Needs assistance Equipment used: Bilateral platform walker Transfers: Sit to/from Stand Sit to Stand: Min guard         General transfer comment: Min guard for safety and steadying. Demonstrated safe hand placement.   Ambulation/Gait Ambulation/Gait assistance: Min guard Ambulation Distance (Feet): 150 Feet Assistive device: Bilateral platform walker Gait Pattern/deviations: Step-through pattern;Trunk flexed;Decreased stride length Gait velocity: Decreased  Gait velocity interpretation: <1.31 ft/sec, indicative of household ambulator General Gait Details: Slow, cautious gait. Verbal cues  for upright posture. Further distance limited secondary to faituge.   Stairs            Wheelchair Mobility    Modified Rankin (Stroke Patients Only)       Balance Overall balance assessment: Needs assistance Sitting-balance support: No upper extremity supported Sitting balance-Leahy Scale: Good     Standing balance support: Bilateral upper extremity supported;During functional activity Standing balance-Leahy Scale: Poor Standing balance comment: Reliant on BUE support.                              Pertinent Vitals/Pain Pain Assessment: Faces Faces Pain Scale: Hurts little more Pain Location: low back at SI joint  Pain Descriptors / Indicators: Aching    Home Living Family/patient expects to be discharged to:: Private residence Living Arrangements: Spouse/significant other Available Help at Discharge: Family;Available 24 hours/day Type of Home: House Home Access: Level entry     Home Layout: Two level Home Equipment: Art gallery manager;Wheelchair - manual;Grab bars - tub/shower;Grab bars - toilet;Shower seat - built in;Other (comment);Walker - 2 wheels(bilat platforms.)      Prior Function Level of Independence: Independent with assistive device(s)         Comments: pt reported that on his "good days" he can ambulate short distances in his home with use of a bilat platform RW and is independent with ADLs.     Hand Dominance   Dominant Hand: Right    Extremity/Trunk Assessment   Upper Extremity Assessment Upper Extremity Assessment: Defer to OT evaluation(RA in both hands )    Lower Extremity Assessment Lower Extremity Assessment: Generalized weakness    Cervical / Trunk Assessment Cervical /  Trunk Assessment: Kyphotic  Communication   Communication: No difficulties  Cognition Arousal/Alertness: Awake/alert Behavior During Therapy: WFL for tasks assessed/performed Overall Cognitive Status: Within Functional Limits for tasks assessed                                         General Comments      Exercises     Assessment/Plan    PT Assessment Patient needs continued PT services  PT Problem List Decreased strength;Decreased mobility;Decreased activity tolerance;Decreased balance;Decreased knowledge of use of DME       PT Treatment Interventions Therapeutic activities;Gait training;Therapeutic exercise;Functional mobility training;Patient/family education;DME instruction;Balance training    PT Goals (Current goals can be found in the Care Plan section)  Acute Rehab PT Goals Patient Stated Goal: to go home  PT Goal Formulation: With patient Time For Goal Achievement: 01/14/18 Potential to Achieve Goals: Good    Frequency Min 3X/week   Barriers to discharge        Co-evaluation               AM-PAC PT "6 Clicks" Daily Activity  Outcome Measure Difficulty turning over in bed (including adjusting bedclothes, sheets and blankets)?: None Difficulty moving from lying on back to sitting on the side of the bed? : A Little Difficulty sitting down on and standing up from a chair with arms (e.g., wheelchair, bedside commode, etc,.)?: Unable Help needed moving to and from a bed to chair (including a wheelchair)?: A Little Help needed walking in hospital room?: A Little Help needed climbing 3-5 steps with a railing? : A Lot 6 Click Score: 16    End of Session Equipment Utilized During Treatment: Gait belt Activity Tolerance: Patient tolerated treatment well Patient left: in bed;with call bell/phone within reach(sitting EOB ) Nurse Communication: Mobility status PT Visit Diagnosis: Unsteadiness on feet (R26.81);Other abnormalities of gait and mobility (R26.89);Muscle weakness (generalized) (M62.81)    Time: 4174-0814 PT Time Calculation (min) (ACUTE ONLY): 40 min   Charges:   PT Evaluation $PT Eval Low Complexity: 1 Low PT Treatments $Gait Training: 8-22 mins $Therapeutic Activity: 8-22  mins   PT G Codes:        Joseph [name], PT, DPT  Acute Rehabilitation Services  Pager: [phone]   Joseph [name] 12/31/2017, 4:40 PM

## 2017-12-31 NOTE — Consult Note (Signed)
PULMONARY / CRITICAL CARE MEDICINE   Name: TORIAN RODRIGUE MRN: [id number] DOB: 11/26/1950    ADMISSION DATE:  12/27/2017 CONSULTATION DATE:  12/31/2017  REFERRING MD:  Dr. Isidoro Donning, [facility]  CHIEF COMPLAINT:  Fever  HISTORY OF PRESENT ILLNESS:   67 yo male well known to pulmonary service was recently tx for respiratory infection with ABx.  Developed fever and diarrhea and admitted.  He has hx of COPD with emphysema, RA, chronic hypoxic respiratory failure.  His CT chest from April showed thickening of bulla in Lt upper lung.  This was associated with increased productive cough and pleuritic chest pain on Lt side.  He was started on ABx this admission.  His GI infection studies were negative.  He had persistent diarrhea.  ABx were stopped and diarrhea has improved.  He is not having chest pain, hemoptysis, sinus congestion, or sore throat.  Breathing is at baseline as well as oxygen needs.  PAST MEDICAL HISTORY :  He  has a past medical history of CAD (coronary artery disease), Cataracts, bilateral, Complication of anesthesia, COPD (chronic obstructive pulmonary disease) (HCC), Diabetes mellitus, Esophageal dysmotility, Fatty liver (10/11/09), Gastroesophageal reflux disease, Gastroparesis, Hiatal hernia, Hypertension, Neuropathy, Onychomycosis, Osteoporosis, Oxygen dependent, Peripheral polyneuropathy, Psoriasis, Pulmonary nodules, Rheumatoid arthritis (HCC), and Septic shock (HCC) (07/2017).  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  No Known Allergies  No current facility-administered  medications on file prior to encounter.    Current Outpatient Medications on File Prior to Encounter  Medication Sig  . albuterol (PROAIR HFA) 108 (90 Base) MCG/ACT inhaler Inhale 2 puffs into the lungs every 4 (four) hours as needed for wheezing or shortness of breath.  Marland Kitchen amoxicillin-clavulanate (AUGMENTIN) 875-125 MG tablet Take 1 tablet by mouth every 12 (twelve) hours for 10 days.  . Ascorbic Acid (VITAMIN C) 500 MG tablet Take 500 mg by mouth daily.    Marland Kitchen aspirin EC 81 MG tablet Take 81 mg by mouth daily.  Marland Kitchen atorvastatin (LIPITOR) 80 MG tablet TAKE 1 TABLET (80mg ) BY MOUTH DAILY (Patient taking differently: Take 80 mg by mouth at bedtime. )  . B Complex-C (SUPER B COMPLEX PO) Take 1 tablet by mouth daily.   Blossom Hoops Iron (PERFECT IRON PO) Take 50 mg by mouth every morning.   . clobetasol cream (TEMOVATE) 0.05 % Apply 1 application topically 2 (two) times daily as needed (rash).   Marland Kitchen dextromethorphan-guaiFENesin (MUCINEX DM) 30-600 MG 12hr tablet Take 1 tablet by mouth 2 (two) times daily. (Patient taking differently: Take 1 tablet by mouth at bedtime. )  . folic acid (FOLVITE) 800 MCG tablet Take 800 mcg by mouth daily.  . furosemide (LASIX) 20 MG tablet Take 3 tablets (60 mg total) by mouth daily. (Patient taking differently: Take 60 mg by mouth daily. Take 40 mg in the morning and 20mg  at 3pm)  . hydrocortisone cream 1 % Apply 1 application topically 3 (three) times daily as needed for itching.  Marland Kitchen ipratropium-albuterol (DUONEB) 0.5-2.5 (3) MG/3ML SOLN Take 3 mLs by nebulization every 4 (four) hours as needed (shortness of breath or wheezing).  . Loteprednol Etabonate (LOTEMAX) 0.5 % GEL Place 1 drop into both eyes every morning. daily  . Melatonin 1 MG  CAPS Take 4 mg by mouth as needed (Sleep).  . Multiple Vitamins-Minerals (VITEYES AREDS ADVANCED PO) Take 1 tablet by mouth daily.  . nitroGLYCERIN (NITROSTAT) 0.4 MG SL tablet Place 1 tablet (0.4 mg total) under the tongue every 5 (five)  minutes as needed for chest pain (up to 3 doses for severe chest pain. If no relief after 3rd dose, proceed to the ED for an evaluation).  Marland Kitchen OVER THE COUNTER MEDICATION Apply topically as needed. T-Gel Shampoo  . oxyCODONE-acetaminophen (PERCOCET) 7.5-325 MG per tablet Take 1 tablet by mouth every 6 (six) hours as needed for moderate pain.   . OXYGEN Inhale 3 L into the lungs continuous.  . pantoprazole (PROTONIX) 40 MG tablet Take 1 tablet (40 mg total) by mouth 2 (two) times daily.  . polyethylene glycol (MIRALAX / GLYCOLAX) packet Take 17 g by mouth daily.  . potassium chloride (KLOR-CON 10) 10 MEQ tablet TAKE 4 TABLETS ( ) BY MOUTH EVERY MORNING AND TAKE 2 TABLETS ( ) EVERY EVENING (Patient taking differently: TAKE 4 TABLETS ( ) BY MOUTH EVERY MORNING AND TAKE 2 TABLETS ( ) 2pm, take along with furosemide)  . predniSONE (DELTASONE) 20 MG tablet Take 15 mg by mouth daily with breakfast.   . pregabalin (LYRICA) 75 MG capsule Take 75 mg by mouth 2 (two) times daily.  . sodium chloride (OCEAN) 0.65 % SOLN nasal spray Place 1 spray into both nostrils as needed for congestion.  . TRELEGY ELLIPTA 100-62.5-25 MCG/INH AEPB Inhale 1 puff into the lungs daily.    FAMILY HISTORY:  His indicated that his mother is deceased. He indicated that his father is deceased. He indicated that his maternal grandmother is deceased. He indicated that his maternal grandfather is deceased. He indicated that his paternal grandmother is deceased. He indicated that his paternal grandfather is deceased. He indicated that the status of his neg hx is unknown.   SOCIAL HISTORY: He  reports that he quit smoking about 12 years ago. He has a 40.00 pack-year smoking history. He quit smokeless tobacco use about 11 years ago. He reports that he does not drink alcohol or use drugs.  REVIEW OF SYSTEMS:   12 pt ROS negative except above.  SUBJECTIVE:   VITAL SIGNS: BP 129/71 (BP Location: Left Arm)   Pulse 70    Temp 98.6 F (37 C)   Resp 18   Ht 6\' 2"  (1.88 m)   Wt 213 lb (96.6 kg)   SpO2 97%   BMI 27.35 kg/m   INTAKE / OUTPUT: I/O last 3 completed shifts: In: 1664.3 [P.O.:480; I.V.:1184.3] Out: 3230 [Urine:3030; Stool:200]  PHYSICAL EXAMINATION:  General - pleasant Eyes - pupils reactive, wears glasses ENT - no sinus tenderness, no oral exudate, no LAN Cardiac - regular, no murmur Chest - decreased BS, no wheeze Abd - soft, non tender Ext - extensive changes from RA Skin - no rashes Neuro - normal strength Psych - normal mood  LABS:  BMET Recent Labs  Lab 12/28/17 0331 12/30/17 0715 12/31/17 0348  NA 135 143 143  K 3.3* 2.9* 2.9*  CL 109 111 110  CO2 16* 24 25  BUN 10 8 5*  CREATININE 0.72 0.78 0.56*  GLUCOSE 146* 111* 112*    Electrolytes Recent Labs  Lab 12/28/17 0331 12/30/17 0715 12/31/17 0348  CALCIUM 8.3* 8.4* 8.1*  MG  --  1.6* 1.9  PHOS  --  2.4*  --     CBC Recent Labs  Lab 12/28/17 0331 12/30/17 0715 12/31/17  0348  WBC 2.3* 2.5* 2.7*  HGB 11.5* 10.9* 9.7*  HCT 34.8* 33.0* 29.6*  PLT 156 181 174    Coag's No results for input(s): APTT, INR in the last 168 hours.  Sepsis Markers Recent Labs  Lab 12/27/17 1729 12/28/17 0331  LATICACIDVEN 1.58 0.9  PROCALCITON  --  0.13    ABG No results for input(s): PHART, PCO2ART, PO2ART in the last 168 hours.  Liver Enzymes Recent Labs  Lab 12/27/17 1721 12/30/17 0715 12/31/17 0348  AST 32 25 21  ALT 37 22 19  ALKPHOS 76 53 50  BILITOT 1.1 0.9 0.7  ALBUMIN 3.1* 2.5* 2.4*    Cardiac Enzymes No results for input(s): TROPONINI, PROBNP in the last 168 hours.  Glucose Recent Labs  Lab 12/26/17 0727 12/26/17 1214 12/28/17 1016 12/28/17 1811 12/29/17 0832 12/30/17 0801  GLUCAP 95 120* 147* 157* 103* 112*    Imaging No results found.   STUDIES:  CT chest 12/22/17 >> bullous emphysema, thick walled bulla Lt upper lobe and localized consolidation  CULTURES: C diff 5/04 >>  negative Stool PCR 5/04 >> negative Blood 5/04 >> negative  ANTIBIOTICS: Vancomycin 5/04 >> 5/06 Flagyl 5/04 >> 5/04 Cefepime 5/04 >> 5/06   DISCUSSION: 66 yo male with recent infected bulla in setting of COPD with bullous emphysema, bronchiectasis, advanced RA, and chronic hypoxia.  He had fever and diarrhea.  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  He is on chronic prednisone.  His fever and diarrhea have improved off ABx.  His respiratory status appears to be at baseline.  It is likely that he had bacterial infection of bulla and has been treated with ABx successfully.  He could also have typical infection, but seems less likely given his clinical response.  ASSESSMENT / PLAN:  COPD with bullous emphysema. Acute on chronic hypoxic respiratory failure. Bronchiectasis. Cavitary lesion Lt upper lung. - continue Breo, incruse - oxygen to keep SpO2 90 to 95% - check sputum culture, fungal culture, and AFB (doesn't need airborne isolation for this) - defer further ABx for now - f/u CXR intermittently, and will need f/u CT chest at some point - if clinical status gets worse, then might need bronchoscopy for more direct airway sampling  Hx of GERD, hiatal hernia, and gastroparesis with recurrent aspiration. - continue protonix  Hx of rheumatoid arthritis. - continue prednisone  Leukopenia. - f/u CBC  Updated pt's wife at bedside.  [name], MD [facility] Pulmonary/Critical Care 12/31/2017, 2:21 PM

## 2017-12-31 NOTE — Progress Notes (Signed)
[facility]                                                                              Patient Demographics  Joseph [name], is a 67 y.o. male, DOB - [birth year], UJW:[id number]  Admit date - 12/27/2017   Admitting Physician Lorretta Harp, MD  Outpatient Primary MD for the patient is [name], [name], MD  Outpatient specialists:   LOS - 3  days   Medical records reviewed and are as summarized below:    Chief Complaint  Patient presents with  . Fever       Brief summary   67 y.o.malewith a hx ofhypertension, hyperlipidemia, COPD, chronic respiratory failure on 3 L [place], GERD, CAD, gastroparesis, psoriasis, rheumatoid arthritis, pancytopenia,and diastolic CHF who presented with diarrhea and fever.  He was hospitalized 4/29-5/3 due to [facility] discharged home on Augmentin. He developed severe diarrhea the following day w/ morethan 20 watery bowel movements and a fever of 102.  ED w/u revealed WBC 4.5, lactic acid 1.58, negative urinalysis, negative C. difficile antigen toxin, temperature 102.8, hypotensive with blood pressure 87/59, tachypnea, oxygen saturation 100% on 3 L nasal cannula oxygen. Chest x-ray showed known left upper lobe thick-walled cavitary lesionwithincreased sizeandincreased surrounding airspace consolidation.   Assessment & Plan   Sepsis patient presented -With fevers, hypotensive, tachypnea, hypoxia at the time of admission  Diarrhea -C. difficile antigen, toxin negative.  GI pathogen panel negative. -Possibly antibiotics associated hence all the antibiotics were discontinued, patient reports no diarrhea overnight and this morning.    HCAP/infected pulmonary bullae Patient was hospitalized 4/29-5/3, currently lungs clear,no leukocytosis -Chest x-ray 5/4 showed known left upper lobe thick-walled cavitary lesion, increase in size with increased surrounding airspace consolidation with emphysema -Patient requesting sputum culture to be sent  and his pulmonologist, Dr. Craige Cotta to be involved in the care.  Discussed with Dr. Craige Cotta, will evaluate patient.    Hypokalemia -Likely due to diarrhea, placed on oral potassium supplements, recheck at 4 PM today  Type 2 diabetes diet controlled -Change to carb modified diet, CBGs controlled  Hyperlipidemia -Continue atorvastatin  GERD -Continue low-dose PPI  Rheumatoid arthritis Continue maintenance dose of prednisone, patient will follow-up with his rheumatologist for further course of action  Chronic diastolic CHF 2D echo 12/18 showed EF of 55 to 6% with grade 1 diastolic dysfunction.  Currently compensated   Code Status: Full code DVT Prophylaxis:  Lovenox  Family Communication: Discussed in detail with the patient, all imaging results, lab results explained to the patient and wife at the bedside  Disposition Plan: PT evaluation, await pulmonology recommendations  Time Spent in minutes  25 minutes  Procedures:    Consultants:   Pulmonology  Antimicrobials:      Medications  Scheduled Meds: . aspirin EC  81 mg Oral Daily  . atorvastatin  80 mg Oral QHS  . enoxaparin (LOVENOX) injection  40 mg Subcutaneous Q24H  . ferrous sulfate  325 mg Oral Q breakfast  . fluticasone furoate-vilanterol  1 puff Inhalation Daily  . folic acid  1,000 mcg Oral Daily  . loteprednol  1 drop Both Eyes q morning - 10a  .  multivitamin  1 tablet Oral Daily  . pantoprazole  40 mg Oral Daily  . predniSONE  15 mg Oral Q breakfast  . pregabalin  75 mg Oral BID  . umeclidinium bromide  1 puff Inhalation Daily  . vitamin C  500 mg Oral Daily   Continuous Infusions: . sodium chloride 60 mL/hr at 12/30/17 1838   PRN Meds:.acetaminophen, albuterol, clobetasol cream, dextromethorphan-guaiFENesin, hydrALAZINE, hydrocortisone cream, loperamide, Melatonin, nitroGLYCERIN, ondansetron (ZOFRAN) IV, oxyCODONE-acetaminophen, sodium chloride, zolpidem   Antibiotics   Anti-infectives (From  admission, onward)   Start     Dose/Rate Route Frequency Ordered Stop   12/28/17 1800  ceFEPIme (MAXIPIME) 2 g in sodium chloride 0.9 % 100 mL IVPB  Status:  Discontinued     2 g 200 mL/hr over 30 Minutes Intravenous Every 8 hours 12/28/17 1205 12/29/17 1702   12/27/17 2100  ceFEPIme (MAXIPIME) 2 g in sodium chloride 0.9 % 100 mL IVPB  Status:  Discontinued     2 g 200 mL/hr over 30 Minutes Intravenous Every 12 hours 12/27/17 2009 12/28/17 1205   12/27/17 2100  vancomycin (VANCOCIN) IVPB 1000 mg/200 mL premix  Status:  Discontinued     1,000 mg 200 mL/hr over 60 Minutes Intravenous Every 8 hours 12/27/17 2010 12/29/17 1702   12/27/17 2000  metroNIDAZOLE (FLAGYL) IVPB 500 mg     500 mg 100 mL/hr over 60 Minutes Intravenous  Once 12/27/17 1956 12/27/17 2219        Subjective:   Talha Iser was seen and examined today.  Still coughing, bringing phlegm.  Otherwise no fevers or chills, appetite improving. Patient denies dizziness, chest pain, abdominal pain, N/V/D/C, new weakness, numbess, tingling. No acute events overnight.    Objective:   Vitals:   12/30/17 1603 12/30/17 1606 12/30/17 2140 12/31/17 0517  BP:  122/70 128/72 129/71  Pulse:  70 79 70  Resp:  18  18  Temp: 98 F (36.7 C)   98.6 F (37 C)  TempSrc: Oral     SpO2:  97% 99% 97%  Weight:      Height:        Intake/Output Summary (Last 24 hours) at 12/31/2017 1312 Last data filed at 12/31/2017 1229 Gross per 24 hour  Intake 1184.33 ml  Output 1855 ml  Net -670.67 ml     Wt Readings from Last 3 Encounters:  12/27/17 96.6 kg (213 lb)  12/22/17 96.9 kg (213 lb 10 oz)  12/15/17 96.2 kg (212 lb)     Exam  General: Alert and oriented x 3, NAD  Eyes:   HEENT:    Cardiovascular: S1 S2 auscultated, Regular rate and rhythm.  Respiratory: Clear to auscultation bilaterally, no wheezing, rales or rhonchi  Gastrointestinal: Soft, nontender, nondistended, + bowel sounds  Ext: no pedal edema  bilaterally  Neuro: AAOx3, Cr N's II- XII. Strength 5/5 upper and lower extremities bilaterally, speech clear, sensations grossly intact  Musculoskeletal: No digital cyanosis, clubbing  Skin: No rashes  Psych: Normal affect and demeanor, alert and oriented x3    Data Reviewed:  I have personally reviewed following labs and imaging studies  Micro Results Recent Results (from the past 240 hour(s))  Blood Culture (routine x 2)     Status: None   Collection Time: 12/22/17 10:46 AM  Result Value Ref Range Status   Specimen Description   Final    BLOOD LEFT ARM Performed at [facility], [address], [place], [place] [address]  Special Requests   Final    BOTTLES DRAWN AEROBIC AND ANAEROBIC Blood Culture adequate volume Performed at [facility], [address] [place], [place] [address]    Culture   Final    NO GROWTH 5 DAYS Performed at [facility], [address], [place], [place] [address]    Report Status 12/27/2017 FINAL  Final  Blood Culture (routine x 2)     Status: None   Collection Time: 12/22/17 10:55 AM  Result Value Ref Range Status   Specimen Description   Final    BLOOD LEFT HAND Performed at [facility], [address] [place], [place] [address]    Special Requests   Final    BOTTLES DRAWN AEROBIC AND ANAEROBIC Blood Culture adequate volume Performed at [facility], [address] [place], [place] [address]    Culture   Final    NO GROWTH 5 DAYS Performed at [facility], [address], [place], [place] [address]    Report Status 12/27/2017 FINAL  Final  MRSA PCR Screening     Status: None   Collection Time: 12/22/17  8:41 PM  Result Value Ref Range Status   MRSA by PCR NEGATIVE NEGATIVE Final    Comment:        The GeneXpert MRSA Assay (FDA approved for NASAL specimens only), is one component of a comprehensive MRSA colonization surveillance program. It is not intended to diagnose  MRSA infection nor to guide or monitor treatment for MRSA infections. Performed at [facility], [address] [place], [place] [address]   Culture, sputum-assessment     Status: None   Collection Time: 12/23/17  1:10 AM  Result Value Ref Range Status   Specimen Description SPUTUM  Final   Special Requests NONE  Final   Sputum evaluation   Final    THIS SPECIMEN IS ACCEPTABLE FOR SPUTUM CULTURE Performed at [facility], [address] [place], [place] [address]    Report Status 12/23/2017 FINAL  Final  Culture, respiratory (NON-Expectorated)     Status: None   Collection Time: 12/23/17  1:10 AM  Result Value Ref Range Status   Specimen Description   Final    SPUTUM Performed at [facility], [address] [place], [place] [address]    Special Requests   Final    NONE Reflexed from [id number] Performed at [facility], [address] [place], [place] [address]    Gram Stain   Final    FEW WBC PRESENT, PREDOMINANTLY PMN RARE GRAM POSITIVE COCCI    Culture   Final    FEW Consistent with normal respiratory flora. Performed at [facility], [address], [place], [place] [address]    Report Status 12/25/2017 FINAL  Final  Blood Culture (routine x 2)     Status: None (Preliminary result)   Collection Time: 12/27/17  5:21 PM  Result Value Ref Range Status   Specimen Description BLOOD LEFT FOREARM  Final   Special Requests   Final    BOTTLES DRAWN AEROBIC AND ANAEROBIC Blood Culture adequate volume   Culture   Final    NO GROWTH 4 DAYS Performed at [facility], [address], [place], [place] [address]    Report Status PENDING  Incomplete  Blood Culture (routine x 2)     Status: None (Preliminary result)  Collection Time: 12/27/17  5:22 PM  Result Value Ref Range Status   Specimen Description BLOOD RIGHT WRIST  Final   Special Requests   Final    BOTTLES DRAWN AEROBIC AND  ANAEROBIC Blood Culture adequate volume   Culture   Final    NO GROWTH 4 DAYS Performed at [facility], [address], [place], [place] [address]    Report Status PENDING  Incomplete  C difficile quick scan w PCR reflex     Status: None   Collection Time: 12/27/17  8:10 PM  Result Value Ref Range Status   C Diff antigen NEGATIVE NEGATIVE Final   C Diff toxin NEGATIVE NEGATIVE Final   C Diff interpretation No C. difficile detected.  Final    Comment: Performed at [facility], [address] [place], [place] [address]  Gastrointestinal Panel by PCR , Stool     Status: None   Collection Time: 12/27/17  8:10 PM  Result Value Ref Range Status   Campylobacter species NOT DETECTED NOT DETECTED Final   Plesimonas shigelloides NOT DETECTED NOT DETECTED Final   Salmonella species NOT DETECTED NOT DETECTED Final   Yersinia enterocolitica NOT DETECTED NOT DETECTED Final   Vibrio species NOT DETECTED NOT DETECTED Final   Vibrio cholerae NOT DETECTED NOT DETECTED Final   Enteroaggregative E coli (EAEC) NOT DETECTED NOT DETECTED Final   Enteropathogenic E coli (EPEC) NOT DETECTED NOT DETECTED Final   Enterotoxigenic E coli (ETEC) NOT DETECTED NOT DETECTED Final   Shiga like toxin producing E coli (STEC) NOT DETECTED NOT DETECTED Final   Shigella/Enteroinvasive E coli (EIEC) NOT DETECTED NOT DETECTED Final   Cryptosporidium NOT DETECTED NOT DETECTED Final   Cyclospora cayetanensis NOT DETECTED NOT DETECTED Final   Entamoeba histolytica NOT DETECTED NOT DETECTED Final   Giardia lamblia NOT DETECTED NOT DETECTED Final   Adenovirus F40/41 NOT DETECTED NOT DETECTED Final   Astrovirus NOT DETECTED NOT DETECTED Final   Norovirus GI/GII NOT DETECTED NOT DETECTED Final   Rotavirus A NOT DETECTED NOT DETECTED Final   Sapovirus (I, II, IV, and V) NOT DETECTED NOT DETECTED Final    Comment: Performed at [facility], [address] [place], [place] [address]  MRSA PCR Screening      Status: None   Collection Time: 12/28/17  5:27 PM  Result Value Ref Range Status   MRSA by PCR NEGATIVE NEGATIVE Final    Comment:        The GeneXpert MRSA Assay (FDA approved for NASAL specimens only), is one component of a comprehensive MRSA colonization surveillance program. It is not intended to diagnose MRSA infection nor to guide or monitor treatment for MRSA infections. Performed at [facility], [address], [place], [place] [address]     Radiology Reports Dg Chest 2 View  Result Date: 12/22/2017 CLINICAL DATA:  Rheumatoid flare up with fever for 2 days. Ex-smoker. EXAM: CHEST - 2 VIEW COMPARISON:  Radiographs 10/27/2017 and 11/08/2017.  CT 08/14/2017. FINDINGS: The heart size and mediastinal contours are stable status post median sternotomy and CABG. There is aortic atherosclerosis. Diffuse central airway thickening is present. There are multiple upper lobe bulla. Dominant left upper lobe lesion demonstrates interval increased wall thickening which may be secondary to superimposed infection or hemorrhage. There is no focal airspace disease, pleural effusion or pneumothorax. No acute osseous findings are evident. IMPRESSION: Interval increased wall thickening of dominant left upper lobe bulla, possibly due to super  infection or hemorrhage. Diffuse central airway thickening. No focal airspace disease. Electronically Signed   By: Carey Bullocks M.D.   On: 12/22/2017 10:50   Ct Chest W Contrast  Result Date: 12/22/2017 CLINICAL DATA:  Fever EXAM: CT CHEST WITH CONTRAST TECHNIQUE: Multidetector CT imaging of the chest was performed during intravenous contrast administration. CONTRAST:  80mL ISOVUE-300 IOPAMIDOL (ISOVUE-300) INJECTION 61% COMPARISON:  Chest radiograph December 22, 2016; chest CT August 14, 2017 FINDINGS: Cardiovascular: There is no thoracic aortic aneurysm or dissection. There is calcification at the origins of the left common carotid and left subclavian  arteries. Mild scattered calcification is noted elsewhere the great vessels. There is aortic atherosclerosis. There are foci of native coronary artery calcification. The patient is status post coronary artery bypass grafting. There is no major vessel pulmonary embolus. No pericardial effusion or pericardial thickening is evident. Mediastinum/Nodes: Thyroid appears normal. There are multiple subcentimeter mediastinal lymph nodes. There is a subcarinal lymph node measuring 1.3 x 1.0 cm. There is a second sub- carinal lymph node measuring 1.2 x 1.0 cm. There is a small hiatal hernia. Lungs/Pleura: There is underlying bullous emphysematous change. Extensive bullous disease is noted in both upper lobes. In comparison with the previous study, there is a bulla in the posterior segment left upper lobe measuring 7.7 x 4.6 cm. The wall of this bulla has become considerably thicker compared to most recent study. There is also thickening along the nearby major fissure with localized consolidation adjacent to the major fissure in the posterior segment of the left upper lobe. There is no other area of consolidation. There are areas of scarring and atelectatic change in the lung bases. There are scattered areas of fibrosis which appear stable. There is a nodular opacity along the right major fissure measuring 9 x 5 mm, stable and likely representing an intrafissural lymph node. Upper Abdomen: Gallbladder is absent. Spleen is incompletely visualized. Spleen measures 13.8 cm from anterior to posterior dimension which is prominent. There is upper abdominal aortic atherosclerosis. Musculoskeletal: No blastic or lytic bone lesions are evident. Patient is status post median sternotomy. No chest wall lesions are evident. IMPRESSION: 1. 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 These changes are similar compared to prior study. Note, however, that the largest bulla, located in the posterior  segment left upper lobe, shows considerable wall thickening, a change from the previous study. There is a nearby area of airspace consolidation in the posterior segment of the left upper lobe adjacent to the major fissure, felt to represent pneumonia. The thickening of the wall of the bulla is most likely due to infection of this bulla. Atypical cystic neoplasm may present in this manner and cannot be entirely excluded at this time. Note that there is no evidence of mycetoma in this bulla or elsewhere. Given this circumstance, would advise repeat CT after treatment for pneumonia to assess for stability or possible resolution of wall thickening in this bulla. Given the possibility of underlying neoplasm, close clinical and imaging surveillance advised in this regard. 2. Mildly prominent sub-carinal lymph nodes of uncertain etiology. These lymph nodes could well be reactive given the parenchymal lung changes. 3. Status post coronary artery bypass grafting. There is aortic and great vessel atherosclerotic calcification as well as calcification in native coronary arteries. 4.  Small hiatal hernia. 5.  Spleen appears enlarged although incompletely visualized. 6.  Gallbladder surgically absent. Aortic Atherosclerosis (ICD10-I70.0) and Emphysema (ICD10-J43.9). Electronically Signed   By: Bretta Bang III  M.D.   On: 12/22/2017 13:17   Dg Chest Port 1 View  Result Date: 12/27/2017 CLINICAL DATA:  Sepsis. EXAM: PORTABLE CHEST 1 VIEW COMPARISON:  Chest radiograph 12/27/2017, chest CT 12/22/2017 FINDINGS: Postsurgical changes from CABG. Cardiomediastinal silhouette is normal. Mediastinal contours appear intact. Emphysema, chronic interstitial lung changes. Previously noted thick-walled cystic structure in the left upper lobe has increased in size no measuring 7.6 cm, with increased surrounding airspace consolidation. Osseous structures are without acute abnormality. Soft tissues are grossly normal. IMPRESSION: Known  left upper lobe thick-walled cavitary lesion has increased in size with increased surrounding airspace consolidation. Emphysema with chronic interstitial lung changes. Electronically Signed   By: Ted Mcalpine M.D.   On: 12/27/2017 17:34   Dg Chest Port 1 View  Result Date: 12/24/2017 CLINICAL DATA:  Pneumonia. EXAM: PORTABLE CHEST 1 VIEW COMPARISON:  CT scan and radiographs of December 22, 2017. FINDINGS: Stable cardiomediastinal silhouette. Status post coronary artery bypass graft. No pneumothorax is noted. Stable scarring is seen throughout both lungs, most prominently seen in right upper lobe. Left upper lobe bulla is again noted with significant wall thickening consistent with infection and surrounding pneumonia. Bony thorax is unremarkable. IMPRESSION: Left upper lobe bulla is again noted with significant wall thickening consistent with infection and surrounding pneumonia as described on prior CT scan. Electronically Signed   By: Lupita Raider, M.D.   On: 12/24/2017 10:00    Lab Data:  CBC: Recent Labs  Lab 12/25/17 0608 12/26/17 0612 12/27/17 1721 12/28/17 0331 12/30/17 0715 12/31/17 0348  WBC 2.1* 3.0* 4.5 2.3* 2.5* 2.7*  NEUTROABS 0.6* 1.1* 1.3*  --  0.6* 0.6*  HGB 11.2* 11.2* 13.1 11.5* 10.9* 9.7*  HCT 34.3* 33.7* 39.0 34.8* 33.0* 29.6*  MCV 87.3 86.9 85.2 86.4 86.6 85.1  PLT 131* 133* 183 156 181 174   Basic Metabolic Panel: Recent Labs  Lab 12/26/17 0612 12/27/17 1721 12/28/17 0331 12/30/17 0715 12/31/17 0348  NA 140 135 135 143 143  K 3.5 3.2* 3.3* 2.9* 2.9*  CL 107 103 109 111 110  CO2 23 20* 16* 24 25  GLUCOSE 100* 147* 146* 111* 112*  BUN 14 10 10 8  5*  CREATININE 0.51* 0.80 0.72 0.78 0.56*  CALCIUM 9.0 9.2 8.3* 8.4* 8.1*  MG  --   --   --  1.6* 1.9  PHOS  --   --   --  2.4*  --    GFR: Estimated Creatinine Clearance: 107 mL/min (A) (by C-G formula based on SCr of 0.56 mg/dL (L)). Liver Function Tests: Recent Labs  Lab 12/27/17 1721 12/30/17 0715  12/31/17 0348  AST 32 25 21  ALT 37 22 19  ALKPHOS 76 53 50  BILITOT 1.1 0.9 0.7  PROT 7.2 5.5* 5.1*  ALBUMIN 3.1* 2.5* 2.4*   No results for input(s): LIPASE, AMYLASE in the last 168 hours. No results for input(s): AMMONIA in the last 168 hours. Coagulation Profile: No results for input(s): INR, PROTIME in the last 168 hours. Cardiac Enzymes: No results for input(s): CKTOTAL, CKMB, CKMBINDEX, TROPONINI in the last 168 hours. BNP (last 3 results) No results for input(s): PROBNP in the last 8760 hours. HbA1C: No results for input(s): HGBA1C in the last 72 hours. CBG: Recent Labs  Lab 12/26/17 1214 12/28/17 1016 12/28/17 1811 12/29/17 0832 12/30/17 0801  GLUCAP 120* 147* 157* 103* 112*   Lipid Profile: No results for input(s): CHOL, HDL, LDLCALC, TRIG, CHOLHDL, LDLDIRECT in the last 72 hours. Thyroid Function  Tests: No results for input(s): TSH, T4TOTAL, FREET4, T3FREE, THYROIDAB in the last 72 hours. Anemia Panel: No results for input(s): VITAMINB12, FOLATE, FERRITIN, TIBC, IRON, RETICCTPCT in the last 72 hours. Urine analysis:    Component Value Date/Time   COLORURINE YELLOW 12/30/2017 0414   APPEARANCEUR HAZY (A) 12/30/2017 0414   LABSPEC 1.011 12/30/2017 0414   PHURINE 6.0 12/30/2017 0414   GLUCOSEU NEGATIVE 12/30/2017 0414   HGBUR LARGE (A) 12/30/2017 0414   HGBUR negative 04/12/2008 1103   BILIRUBINUR NEGATIVE 12/30/2017 0414   KETONESUR NEGATIVE 12/30/2017 0414   PROTEINUR NEGATIVE 12/30/2017 0414   UROBILINOGEN 0.2 10/17/2012 1236   NITRITE NEGATIVE 12/30/2017 0414   LEUKOCYTESUR NEGATIVE 12/30/2017 0414     Ripudeep Rai M.D. [facility] 12/31/2017, 1:12 PM  Pager: [phone] Between 7am to 7pm - call Pager - [phone]  After 7pm go to [email or web address] - password [email or web address]  Call night coverage person covering after 7pm

## 2017-12-31 NOTE — Consult Note (Signed)
   [facility] CM Inpatient Consult   12/31/2017  Marsean C Diekman 10/14/1950 [id number]   Patient screened for potential [facility] Care Management services. Patient is in the ACO of the [facility] Care Management services under patient's [employer] Medicare  plan.  Met with the patient regarding post hospital needs. Wife not in attendance will plan to follow up tomorrow. Spoke with patient in depth regarding barriers and potential for ongoing care management.  He states he will be moving to [place] as soon as the doctors her give him the clearance.  They have already purchased home and was scheduled to move this weekend.  He states his new primary care provider is with [facility] and he had tried to stay in the system but needed a practice that had everything and close to new home.  Will follow up with wife.  Please place a [facility] Care Management consult or for questions contact:    , RN BSN CCM [facility] Liaison  [phone] business mobile phone Toll free office [phone]    

## 2018-01-01 ENCOUNTER — Telehealth: Payer: Self-pay | Admitting: *Deleted

## 2018-01-01 DIAGNOSIS — J449 Chronic obstructive pulmonary disease, unspecified: Secondary | ICD-10-CM

## 2018-01-01 DIAGNOSIS — J9621 Acute and chronic respiratory failure with hypoxia: Secondary | ICD-10-CM

## 2018-01-01 LAB — CBC
HEMATOCRIT: 32 % — AB (ref 39.0–52.0)
Hemoglobin: 10.5 g/dL — ABNORMAL LOW (ref 13.0–17.0)
MCH: 28.4 pg (ref 26.0–34.0)
MCHC: 32.8 g/dL (ref 30.0–36.0)
MCV: 86.5 fL (ref 78.0–100.0)
Platelets: 193 10*3/uL (ref 150–400)
RBC: 3.7 MIL/uL — ABNORMAL LOW (ref 4.22–5.81)
RDW: 16.6 % — ABNORMAL HIGH (ref 11.5–15.5)
WBC: 2.9 10*3/uL — ABNORMAL LOW (ref 4.0–10.5)

## 2018-01-01 LAB — BASIC METABOLIC PANEL
Anion gap: 8 (ref 5–15)
BUN: 7 mg/dL (ref 6–20)
CHLORIDE: 105 mmol/L (ref 101–111)
CO2: 29 mmol/L (ref 22–32)
Calcium: 8.5 mg/dL — ABNORMAL LOW (ref 8.9–10.3)
Creatinine, Ser: 0.52 mg/dL — ABNORMAL LOW (ref 0.61–1.24)
GFR calc Af Amer: >60 mL/min (ref >60)
GFR calc non Af Amer: >60 mL/min (ref >60)
GLUCOSE: 110 mg/dL — AB (ref 65–99)
Potassium: 3.3 mmol/L — ABNORMAL LOW (ref 3.5–5.1)
Sodium: 142 mmol/L (ref 135–145)

## 2018-01-01 LAB — GLUCOSE, CAPILLARY: Glucose-Capillary: 102 mg/dL — ABNORMAL HIGH (ref 65–99)

## 2018-01-01 LAB — DIFFERENTIAL
BASOS PCT: 0 %
Basophils Absolute: 0 10*3/uL (ref 0.0–0.1)
Eosinophils Absolute: 0 10*3/uL (ref 0.0–0.7)
Eosinophils Relative: 1 %
LYMPHS PCT: 58 %
Lymphs Abs: 1.7 10*3/uL (ref 0.7–4.0)
Monocytes Absolute: 0.6 10*3/uL (ref 0.1–1.0)
Monocytes Relative: 19 %
NEUTROS ABS: 0.6 10*3/uL — AB (ref 1.7–7.7)
Neutrophils Relative %: 22 %

## 2018-01-01 LAB — CULTURE, BLOOD (ROUTINE X 2)
Culture: NO GROWTH
Culture: NO GROWTH
Special Requests: ADEQUATE
Special Requests: ADEQUATE

## 2018-01-01 MED ORDER — POTASSIUM CHLORIDE CRYS ER 20 MEQ PO TBCR
40.0000 meq | EXTENDED_RELEASE_TABLET | Freq: Once | ORAL | Status: AC
  Administered 2018-01-01: 40 meq via ORAL
  Filled 2018-01-01: qty 2

## 2018-01-01 NOTE — Evaluation (Signed)
Occupational Therapy Evaluation Patient Details Name: Joseph [name] MRN: [id number] DOB: 1951-03-21 Today's Date: 01/01/2018    History of Present Illness Pt is a 67 y/o male admitted secondary to fever and diarrhea. Pt with recent admission to [facility] for pneumonia. PMH includes DM, HTN, CAD, dCHF, COPD on 3L of oxygen at home, and RA.    Clinical Impression   This 67 y/o male presents with the above. At baseline pt reports mod independence with ADLs and functional mobility using walking stick and platform RW for longer distance ambulation; pt reports being active. Pt completing room and hallway level functional mobility using bil platform RW this session with MinA, progressing to minguard assist; currently requires MaxA for LB ADLs, MinA for UB ADLs, limited mostly by generalized weakness and decreased activity tolerance. Pt will benefit from continued acute OT services and recommend additional HHOT services after discharge to maximize his overall safety and independence with ADLs and mobility.     Follow Up Recommendations  Home health OT;Supervision/Assistance - 24 hour    Equipment Recommendations  Other (comment)(RW)           Precautions / Restrictions Precautions Precautions: Fall Restrictions Weight Bearing Restrictions: No      Mobility Bed Mobility Overal bed mobility: Needs Assistance Bed Mobility: Supine to Sit     Supine to sit: Mod assist     General bed mobility comments: assist to bring trunk upright into sitting  Transfers Overall transfer level: Needs assistance Equipment used: Bilateral platform walker Transfers: Sit to/from Stand Sit to Stand: Min assist         General transfer comment: MinA for safety and steadying upon standing. Demonstrated safe hand placement.     Balance Overall balance assessment: Needs assistance Sitting-balance support: No upper extremity supported Sitting balance-Leahy Scale: Good     Standing balance support:  Bilateral upper extremity supported;During functional activity Standing balance-Leahy Scale: Poor Standing balance comment: Reliant on BUE support.                            ADL either performed or assessed with clinical judgement   ADL Overall ADL's : Needs assistance/impaired Eating/Feeding: Modified independent;Sitting Eating/Feeding Details (indicate cue type and reason): pt reports he has a specific cup he uses for drinking due to RA in hands; reports no difficulty grasping silverware  Grooming: Set up;Sitting   Upper Body Bathing: Min guard;Sitting   Lower Body Bathing: Sit to/from stand;Moderate assistance   Upper Body Dressing : Minimal assistance;Sitting Upper Body Dressing Details (indicate cue type and reason): donning second gown  Lower Body Dressing: Maximal assistance;Sit to/from stand Lower Body Dressing Details (indicate cue type and reason): spouse assisting to don shoes at bed level start of session  Toilet Transfer: Minimal assistance;Ambulation;BSC;RW Toilet Transfer Details (indicate cue type and reason): BSC over toilet; simulated in transfer to/from EOB  Toileting- Clothing Manipulation and Hygiene: Minimal assistance;Sit to/from stand       Functional mobility during ADLs: Minimal assistance;Min guard;Rolling walker(bil platform RW ) General ADL Comments: pt requesting to complete hallway functional mobility during session; completing using bil platform RW with Min-minguard assist. Pt on 3L O2 during activity with SpO2 remaining above 94% at start and end of ambulation                         Pertinent Vitals/Pain Pain Assessment: Faces Faces Pain Scale: Hurts little more Pain Location:  low back at [facility] joint  Pain Descriptors / Indicators: Aching Pain Intervention(s): Monitored during session;Repositioned;Premedicated before session     Hand Dominance Right   Extremity/Trunk Assessment Upper Extremity Assessment Upper Extremity  Assessment: Generalized weakness;RUE deficits/detail;LUE deficits/detail RUE Deficits / Details: significant contractures in bil hands due to RA; limited elbow extension also due to RA, though elbow extension appears WFL; pt reports he has hand splints at home, demontrates good understanding of hand hygiene given contractures  LUE Deficits / Details: significant contractures in bil hands due to RA; limited elbow extension also due to RA, though elbow extension appears WFL; pt reports he has hand splints at home, demontrates good understanding of hand hygiene given contractures    Lower Extremity Assessment Lower Extremity Assessment: Defer to PT evaluation   Cervical / Trunk Assessment Cervical / Trunk Assessment: Kyphotic   Communication Communication Communication: No difficulties   Cognition Arousal/Alertness: Awake/alert Behavior During Therapy: WFL for tasks assessed/performed Overall Cognitive Status: Within Functional Limits for tasks assessed                                                      Home Living Family/patient expects to be discharged to:: Private residence Living Arrangements: Spouse/significant other Available Help at Discharge: Family;Available 24 hours/day Type of Home: House Home Access: Level entry     Home Layout: Two level Alternate Level Stairs-Number of Steps: has a Sports coach Shower/Tub: Producer, television/film/video: Handicapped height     Home Equipment: Art gallery manager;Wheelchair - manual;Grab bars - tub/shower;Grab bars - toilet;Shower seat - built in;Other (comment);Walker - 2 wheels(bil platform RW)          Prior Functioning/Environment Level of Independence: Independent with assistive device(s)        Comments: pt reported that on his "good days" he can ambulate short distances in his home with use of a walking stick or bilat platform RW and is independent with ADLs.        OT Problem List:  Decreased strength;Impaired balance (sitting and/or standing);Decreased range of motion;Decreased activity tolerance;Pain      OT Treatment/Interventions: Self-care/ADL training;DME and/or AE instruction;Therapeutic activities;Balance training;Therapeutic exercise;Patient/family education;Energy conservation    OT Goals(Current goals can be found in the care plan section) Acute Rehab OT Goals Patient Stated Goal: to go home  OT Goal Formulation: With patient Time For Goal Achievement: 01/15/18 Potential to Achieve Goals: Good  OT Frequency: Min 2X/week                             AM-PAC PT "6 Clicks" Daily Activity     Outcome Measure Help from another person eating meals?: None Help from another person taking care of personal grooming?: A Little Help from another person toileting, which includes using toliet, bedpan, or urinal?: A Lot Help from another person bathing (including washing, rinsing, drying)?: A Lot Help from another person to put on and taking off regular upper body clothing?: A Little Help from another person to put on and taking off regular lower body clothing?: A Lot 6 Click Score: 16   End of Session Equipment Utilized During Treatment: Gait belt;Rolling walker;Other (comment)(bil platform RW ) Nurse Communication: Mobility status  Activity Tolerance: Patient tolerated treatment well Patient left: with call  bell/phone within reach;with family/visitor present;Other (comment)(sitting EOB )  OT Visit Diagnosis: Muscle weakness (generalized) (M62.81)                Time: 3154-0086 OT Time Calculation (min): 33 min Charges:  OT General Charges $OT Visit: 1 Visit OT Evaluation $OT Eval Moderate Complexity: 1 Mod OT Treatments $Therapeutic Activity: 8-22 mins G-Codes:     Marcy Siren, OT Pager [phone] 01/01/2018  Orlando Penner 01/01/2018, 4:20 PM

## 2018-01-01 NOTE — Progress Notes (Signed)
PT Cancellation Note  Patient Details Name: ZAHKI HOOGENDOORN MRN: [id number] DOB: [birth year]   Cancelled Treatment:    Reason Eval/Treat Not Completed: Pain limiting ability to participate(pt reports R sacroiliac pain is too severe to tolerate activity at present. )Will follow.    Ralene Bathe Kistler 01/01/2018, 11:16 AM [phone]

## 2018-01-01 NOTE — Progress Notes (Signed)
PULMONARY / CRITICAL CARE MEDICINE   Name: SHAUNE WESTFALL MRN: [id number] DOB: 1951-04-16    ADMISSION DATE:  12/27/2017 CONSULTATION DATE:  12/31/2017  REFERRING MD:  Dr. Isidoro Donning, [facility]  CHIEF COMPLAINT:  Fever  HISTORY OF PRESENT ILLNESS:   67 yo male well known to pulmonary service was recently tx for respiratory infection with ABx.  Developed fever and diarrhea and admitted.  He has hx of COPD with emphysema, RA, chronic hypoxic respiratory failure.  His CT chest from April showed thickening of bulla in Lt upper lung.  This was associated with increased productive cough and pleuritic chest pain on Lt side.  He was started on ABx this admission.  His GI infection studies were negative.  He had persistent diarrhea.  ABx were stopped and diarrhea has improved.  He is not having chest pain, hemoptysis, sinus congestion, or sore throat.  Breathing is at baseline as well as oxygen needs.  SUBJECTIVE:  Feels weak and chilled.  He spoke with rheumatology at [facility].  Concern for MTX induced neutropenia versus Felty syndrome.  Was advised to try and make arrangements for transfer to [facility] to have further management with hematology and rheumatology.  VITAL SIGNS: BP (!) 141/76 (BP Location: Left Arm)   Pulse 68   Temp 98.3 F (36.8 C) (Oral)   Resp 18   Ht 6\' 2"  (1.88 m)   Wt 213 lb (96.6 kg)   SpO2 97%   BMI 27.35 kg/m   INTAKE / OUTPUT: I/O last 3 completed shifts: In: -  Out: 2016 [Urine:2015; Stool:1]  PHYSICAL EXAMINATION:  General - laying in bed Eyes - pupils reactive ENT - no sinus tenderness, no oral exudate, no LAN Cardiac - regular, no murmur Chest - decreased BS, no wheeze, rales Abd - soft, non tender Ext - extensive changes of RA Skin - no rashes Neuro - normal strength Psych - normal mood  LABS:  BMET Recent Labs  Lab 12/30/17 0715 12/31/17 0348 12/31/17 1542 01/01/18 0401  NA 143 143  --  142  K 2.9* 2.9* 4.1 3.3*  CL 111 110  --  105  CO2 24 25  --  29  BUN 8  5*  --  7  CREATININE 0.78 0.56*  --  0.52*  GLUCOSE 111* 112*  --  110*    Electrolytes Recent Labs  Lab 12/30/17 0715 12/31/17 0348 01/01/18 0401  CALCIUM 8.4* 8.1* 8.5*  MG 1.6* 1.9  --   PHOS 2.4*  --   --     CBC Recent Labs  Lab 12/30/17 0715 12/31/17 0348 01/01/18 0401  WBC 2.5* 2.7* 2.9*  HGB 10.9* 9.7* 10.5*  HCT 33.0* 29.6* 32.0*  PLT 181 174 193    Coag's No results for input(s): APTT, INR in the last 168 hours.  Sepsis Markers Recent Labs  Lab 12/27/17 1729 12/28/17 0331  LATICACIDVEN 1.58 0.9  PROCALCITON  --  0.13    ABG No results for input(s): PHART, PCO2ART, PO2ART in the last 168 hours.  Liver Enzymes Recent Labs  Lab 12/27/17 1721 12/30/17 0715 12/31/17 0348  AST 32 25 21  ALT 37 22 19  ALKPHOS 76 53 50  BILITOT 1.1 0.9 0.7  ALBUMIN 3.1* 2.5* 2.4*    Cardiac Enzymes No results for input(s): TROPONINI, PROBNP in the last 168 hours.  Glucose Recent Labs  Lab 12/26/17 0727 12/26/17 1214 12/28/17 1016 12/28/17 1811 12/29/17 0832 12/30/17 0801  GLUCAP 95 120* 147* 157* 103* 112*  Imaging No results found.   STUDIES:  CT chest 12/22/17 >> bullous emphysema, thick walled bulla Lt upper lobe and localized consolidation  CULTURES: C diff 5/04 >> negative Stool PCR 5/04 >> negative Blood 5/04 >> negative Sputum 5/09 >> Sputum AFB 5/09 >> Sputum Fungal 5/09 >>   ANTIBIOTICS: Vancomycin 5/04 >> 5/06 Flagyl 5/04 >> 5/04 Cefepime 5/04 >> 5/06  DISCUSSION: 67 yo male with recent infected bulla in setting of COPD with bullous emphysema, bronchiectasis, advanced RA, and chronic hypoxia.  He had fever and diarrhea.  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  He is on chronic prednisone.  His fever and diarrhea have improved off ABx.  His respiratory status appears to be at baseline.  It is likely that he had bacterial infection of bulla and has been treated with ABx successfully.  He could also have typical  infection, but seems less likely given his clinical response.  ASSESSMENT / PLAN:  COPD with bullous emphysema. Acute on chronic hypoxic respiratory failure. Bronchiectasis. Cavitary lesion Lt upper lung. - continue Breo, Incruse - oxygen to keep SpO2 90 to 95% - f/u sputum cultures - defer ABx for now - f/u CXR - if he gets worse, then consider bronchoscopy  Neutropenia. - he d/w his rheumatologist at [facility] and was advised to get transfer to [facility] >> d/w hospitalist to make arrangements  Hx of GERD, hiatal hernia, and gastroparesis with recurrent aspiration. - continue protonix  Hx of rheumatoid arthritis. - continue prednisone  Updated wife at bedside  [name], MD [facility] Pulmonary/Critical Care 01/01/2018, 11:39 AM

## 2018-01-01 NOTE — Consult Note (Signed)
   [facility] CM Inpatient Consult   01/01/2018  Joseph [name] 03-Sep-1950 [id number]  Met with patient and wife.  Patient was just starting his physical therapy session.  Wife states that the patient will be transferring to [facility] as soon as a bed is available. She states that she doesn't know when he will be able to move states, "we have got to find some answers and tired of being on the hamster wheel with this."  Explained that this writer can forward the contact information with the new address to [facility] Management team as the [place] Management does not follow with [place] patient's at this time. She verbalized understanding and said [facility] is good about contacting them anyway.   She states she will be the best contact person by her cell phone at this time at [phone]. Their new home address is [address], [place] [address] to be closer to family.  For questions, contact:  Natividad Brood, RN BSN [facility] Liaison  [phone] business mobile phone Toll free office [phone]

## 2018-01-01 NOTE — Telephone Encounter (Signed)
Received Physician Orders from [facility]; forwarded to provider/SLS 05/09 Received Home Health Certification and Plan of Care; forwarded to provider/SLS 05/09 Received Addendum to Plan of Treatment; forwarded to provider/SLS 05/09 Received Home Health Plan of Care Update; forwarded to provider/SLS 05/09  Patient is moving to [facility] after Mother's Day; PCP provider has already been changed in EMR for Dr. Reynaldo Minium with [facility] [on 12/19/17]/SLS 05/09

## 2018-01-01 NOTE — Progress Notes (Addendum)
[facility]                                                                              Patient Demographics  Joseph [name], is a 67 y.o. male, DOB - Apr 12, 1951, ZOX:[id number]  Admit date - 12/27/2017   Admitting Physician Lorretta Harp, MD  Outpatient Primary MD for the patient is [name], [name], MD  Outpatient specialists:   LOS - 4  days   Medical records reviewed and are as summarized below:    Chief Complaint  Patient presents with  . Fever       Brief summary   67 y.o.malewith a hx ofhypertension, hyperlipidemia, COPD, chronic respiratory failure on 3 L [place], GERD, CAD, gastroparesis, psoriasis, rheumatoid arthritis, pancytopenia,and diastolic CHF who presented with diarrhea and fever.  He was hospitalized 4/29-5/3 due to [facility] discharged home on Augmentin. He developed severe diarrhea the following day w/ morethan 20 watery bowel movements and a fever of 102.  ED w/u revealed WBC 4.5, lactic acid 1.58, negative urinalysis, negative C. difficile antigen toxin, temperature 102.8, hypotensive with blood pressure 87/59, tachypnea, oxygen saturation 100% on 3 L nasal cannula oxygen. Chest x-ray showed known left upper lobe thick-walled cavitary lesionwithincreased sizeandincreased surrounding airspace consolidation.   Assessment & Plan   Sepsis  - patient presented with fevers, hypotensive, BP in 80s, tachypnea, hypoxia at the time of admission.  Currently sepsis physiology resolved however continues to have leukopenia with neutropenia.  Diarrhea -Currently resolved -C. difficile antigen, toxin negative.  GI pathogen panel negative. -Possibly antibiotics associated hence all the antibiotics were discontinued, patient reports no diarrhea for last 24 hours  HCAP/infected pulmonary bullae Patient was hospitalized 4/29-5/3, currently lungs clear,no leukocytosis -Chest x-ray 5/4 showed known left upper lobe thick-walled cavitary lesion, increase in  size with increased surrounding airspace consolidation with emphysema -Pulmonology following, follow AFB, fungal cultures, sputum cultures  Leukopenia with neutropenia -Office notes reviewed from his rheumatologist at [facility], Dr. Thad Ranger, patient has a history of rheumatoid arthritis and had restarted methotrexate in April 2019 with prednisone.  However patient developed HCAP and was admitted, methotrexate was discontinued.  His rheumatologist also noticed correlation between his cytopenias and methotrexate. -Continue neutropenic precautions, patient contacted his rheumatologist at [facility] today and wants to be transferred there -I have called the [facility] transfer line, gave the information, requested RN to fax the facesheet as per instructions.  Once patient is accepted by the receiving physician, will initiate transfer Addendum: 3:45pm Received callback from transfer line, discussed with Dr.Christopher Collie Siad, hospitalist on-call, patient placed on wait list for regular bed, currently no beds are available.   Hypokalemia -Likely due to diarrhea, potassium 3.3, K-Dur 40 meq x 1 today  Type 2 diabetes diet controlled -Change to carb modified diet, CBGs controlled  Hyperlipidemia -Continue atorvastatin  GERD -Continue low-dose PPI  Rheumatoid arthritis Continue maintenance dose of prednisone, patient will follow-up with his rheumatologist for further course of action  Chronic diastolic CHF 2D echo 12/18 showed EF of 55 to 6% with grade 1 diastolic dysfunction.  Currently compensated   Code Status: Full code DVT Prophylaxis:  Lovenox  Family Communication: Discussed in detail with the patient, all imaging results, lab results explained to the patient and wife at the bedside.  Patient requested Dr. Sharon Seller to follow him from tomorrow a.m. 5/10.  I have requested the flow manager to assign patient care to Dr. Sharon Seller per his request from 5/10 AM  Disposition Plan:  Possible [facility] transfer.  Time Spent in minutes  25 minutes  Procedures:    Consultants:   Pulmonology  Antimicrobials:      Medications  Scheduled Meds: . aspirin EC  81 mg Oral Daily  . atorvastatin  80 mg Oral QHS  . enoxaparin (LOVENOX) injection  40 mg Subcutaneous Q24H  . ferrous sulfate  325 mg Oral Q breakfast  . fluticasone furoate-vilanterol  1 puff Inhalation Daily  . folic acid  1,000 mcg Oral Daily  . loteprednol  1 drop Both Eyes q morning - 10a  . multivitamin  1 tablet Oral Daily  . pantoprazole  40 mg Oral Daily  . predniSONE  15 mg Oral Q breakfast  . pregabalin  75 mg Oral BID  . umeclidinium bromide  1 puff Inhalation Daily  . vitamin C  500 mg Oral Daily   Continuous Infusions:  PRN Meds:.acetaminophen, albuterol, clobetasol cream, dextromethorphan-guaiFENesin, hydrALAZINE, hydrocortisone cream, loperamide, Melatonin, nitroGLYCERIN, ondansetron (ZOFRAN) IV, oxyCODONE-acetaminophen, sodium chloride   Antibiotics   Anti-infectives (From admission, onward)   Start     Dose/Rate Route Frequency Ordered Stop   12/28/17 1800  ceFEPIme (MAXIPIME) 2 g in sodium chloride 0.9 % 100 mL IVPB  Status:  Discontinued     2 g 200 mL/hr over 30 Minutes Intravenous Every 8 hours 12/28/17 1205 12/29/17 1702   12/27/17 2100  ceFEPIme (MAXIPIME) 2 g in sodium chloride 0.9 % 100 mL IVPB  Status:  Discontinued     2 g 200 mL/hr over 30 Minutes Intravenous Every 12 hours 12/27/17 2009 12/28/17 1205   12/27/17 2100  vancomycin (VANCOCIN) IVPB 1000 mg/200 mL premix  Status:  Discontinued     1,000 mg 200 mL/hr over 60 Minutes Intravenous Every 8 hours 12/27/17 2010 12/29/17 1702   12/27/17 2000  metroNIDAZOLE (FLAGYL) IVPB 500 mg     500 mg 100 mL/hr over 60 Minutes Intravenous  Once 12/27/17 1956 12/27/17 2219        Subjective:   Joseph [name] was seen and examined today.  Still feeling weak, no fevers overnight.  Concerned about neutrophils and  white blood cell count still not improving.  Patient denies dizziness, chest pain, abdominal pain, N/V/D/C, new weakness, numbess, tingling. No acute events overnight.    Objective:   Vitals:   12/31/17 0517 12/31/17 1427 12/31/17 2221 01/01/18 0556  BP: 129/71 119/72 121/74 (!) 141/76  Pulse: 70 77 77 68  Resp: 18  18 18   Temp: 98.6 F (37 C) 98.1 F (36.7 C) 99 F (37.2 C) 98.3 F (36.8 C)  TempSrc:  Oral Oral Oral  SpO2: 97% 100% 97% 97%  Weight:      Height:        Intake/Output Summary (Last 24 hours) at 01/01/2018 1408 Last data filed at 12/31/2017 1941 Gross per 24 hour  Intake -  Output 401 ml  Net -401 ml     Wt Readings from Last 3 Encounters:  12/27/17 96.6 kg (213 lb)  12/22/17 96.9 kg (213 lb 10 oz)  12/15/17 96.2 kg (212 lb)     Exam  General: Alert and oriented x  3, NAD  Eyes:   HEENT:    Cardiovascular: S1 S2 auscultated, Regular rate and rhythm.  Respiratory: Clear to auscultation bilaterally, no wheezing, rales or rhonchi  Gastrointestinal: Soft, nontender, nondistended, + bowel sounds  Ext: no pedal edema bilaterally  Neuro: AAOx3, Cr N's II- XII. Strength 5/5 upper and lower extremities bilaterally, speech clear, sensations grossly intact  Musculoskeletal: No digital cyanosis, clubbing  Skin: No rashes  Psych: Normal affect and demeanor, alert and oriented x3    Data Reviewed:  I have personally reviewed following labs and imaging studies  Micro Results Recent Results (from the past 240 hour(s))  MRSA PCR Screening     Status: None   Collection Time: 12/22/17  8:41 PM  Result Value Ref Range Status   MRSA by PCR NEGATIVE NEGATIVE Final    Comment:        The GeneXpert MRSA Assay (FDA approved for NASAL specimens only), is one component of a comprehensive MRSA colonization surveillance program. It is not intended to diagnose MRSA infection nor to guide or monitor treatment for MRSA infections. Performed at [facility], [address] [place], [place] [address]   Culture, sputum-assessment     Status: None   Collection Time: 12/23/17  1:10 AM  Result Value Ref Range Status   Specimen Description SPUTUM  Final   Special Requests NONE  Final   Sputum evaluation   Final    THIS SPECIMEN IS ACCEPTABLE FOR SPUTUM CULTURE Performed at [facility], [address] [place], [place] [address]    Report Status 12/23/2017 FINAL  Final  Culture, respiratory (NON-Expectorated)     Status: None   Collection Time: 12/23/17  1:10 AM  Result Value Ref Range Status   Specimen Description   Final    SPUTUM Performed at [facility], [address] [place], [place] [address]    Special Requests   Final    NONE Reflexed from [id number] Performed at [facility], [address] [place], [place] [address]    Gram Stain   Final    FEW WBC PRESENT, PREDOMINANTLY PMN RARE GRAM POSITIVE COCCI    Culture   Final    FEW Consistent with normal respiratory flora. Performed at [facility], [address], [place], [place] [address]    Report Status 12/25/2017 FINAL  Final  Blood Culture (routine x 2)     Status: None   Collection Time: 12/27/17  5:21 PM  Result Value Ref Range Status   Specimen Description BLOOD LEFT FOREARM  Final   Special Requests   Final    BOTTLES DRAWN AEROBIC AND ANAEROBIC Blood Culture adequate volume   Culture   Final    NO GROWTH 5 DAYS Performed at [facility], [address] [place], [place] [address]    Report Status 01/01/2018 FINAL  Final  Blood Culture (routine x 2)     Status: None   Collection Time: 12/27/17  5:22 PM  Result Value Ref Range Status   Specimen Description BLOOD RIGHT WRIST  Final   Special Requests   Final    BOTTLES DRAWN AEROBIC AND ANAEROBIC Blood Culture adequate volume   Culture   Final    NO GROWTH 5 DAYS Performed at [facility], [address],  [place], [place] [address]    Report Status 01/01/2018 FINAL  Final  C difficile quick scan w PCR reflex  Status: None   Collection Time: 12/27/17  8:10 PM  Result Value Ref Range Status   C Diff antigen NEGATIVE NEGATIVE Final   C Diff toxin NEGATIVE NEGATIVE Final   C Diff interpretation No C. difficile detected.  Final    Comment: Performed at [facility], [address] [place], [place] [address]  Gastrointestinal Panel by PCR , Stool     Status: None   Collection Time: 12/27/17  8:10 PM  Result Value Ref Range Status   Campylobacter species NOT DETECTED NOT DETECTED Final   Plesimonas shigelloides NOT DETECTED NOT DETECTED Final   Salmonella species NOT DETECTED NOT DETECTED Final   Yersinia enterocolitica NOT DETECTED NOT DETECTED Final   Vibrio species NOT DETECTED NOT DETECTED Final   Vibrio cholerae NOT DETECTED NOT DETECTED Final   Enteroaggregative E coli (EAEC) NOT DETECTED NOT DETECTED Final   Enteropathogenic E coli (EPEC) NOT DETECTED NOT DETECTED Final   Enterotoxigenic E coli (ETEC) NOT DETECTED NOT DETECTED Final   Shiga like toxin producing E coli (STEC) NOT DETECTED NOT DETECTED Final   Shigella/Enteroinvasive E coli (EIEC) NOT DETECTED NOT DETECTED Final   Cryptosporidium NOT DETECTED NOT DETECTED Final   Cyclospora cayetanensis NOT DETECTED NOT DETECTED Final   Entamoeba histolytica NOT DETECTED NOT DETECTED Final   Giardia lamblia NOT DETECTED NOT DETECTED Final   Adenovirus F40/41 NOT DETECTED NOT DETECTED Final   Astrovirus NOT DETECTED NOT DETECTED Final   Norovirus GI/GII NOT DETECTED NOT DETECTED Final   Rotavirus A NOT DETECTED NOT DETECTED Final   Sapovirus (I, II, IV, and V) NOT DETECTED NOT DETECTED Final    Comment: Performed at [facility], [address] [place], [place] [address]  MRSA PCR Screening     Status: None   Collection Time: 12/28/17  5:27 PM  Result Value Ref Range Status   MRSA by PCR NEGATIVE NEGATIVE Final     Comment:        The GeneXpert MRSA Assay (FDA approved for NASAL specimens only), is one component of a comprehensive MRSA colonization surveillance program. It is not intended to diagnose MRSA infection nor to guide or monitor treatment for MRSA infections. Performed at [facility], [address] [place], [place] [address]   Culture, expectorated sputum-assessment     Status: None   Collection Time: 12/31/17  6:53 PM  Result Value Ref Range Status   Specimen Description EXPECTORATED SPUTUM  Final   Special Requests   Final    NONE Performed at [facility], [address], [place], [place] [address]    Sputum evaluation   Final    Sputum specimen not acceptable for testing.  Please recollect.   Results Called to: J. THACKER, RN AT 2327 ON 12/31/17 BY C. JESSUP, MLT.    Report Status 12/31/2017 FINAL  Final    Radiology Reports Dg Chest 2 View  Result Date: 12/22/2017 CLINICAL DATA:  Rheumatoid flare up with fever for 2 days. Ex-smoker. EXAM: CHEST - 2 VIEW COMPARISON:  Radiographs 10/27/2017 and 11/08/2017.  CT 08/14/2017. FINDINGS: The heart size and mediastinal contours are stable status post median sternotomy and CABG. There is aortic atherosclerosis. Diffuse central airway thickening is present. There are multiple upper lobe bulla. Dominant left upper lobe lesion demonstrates interval increased wall thickening which may be secondary to superimposed infection or hemorrhage. There is no focal airspace disease, pleural effusion or pneumothorax. No acute osseous findings are evident. IMPRESSION:  Interval increased wall thickening of dominant left upper lobe bulla, possibly due to super infection or hemorrhage. Diffuse central airway thickening. No focal airspace disease. Electronically Signed   By: Carey Bullocks M.D.   On: 12/22/2017 10:50   Ct Chest W Contrast  Result Date: 12/22/2017 CLINICAL DATA:  Fever EXAM: CT CHEST WITH CONTRAST TECHNIQUE: Multidetector CT  imaging of the chest was performed during intravenous contrast administration. CONTRAST:  15mL ISOVUE-300 IOPAMIDOL (ISOVUE-300) INJECTION 61% COMPARISON:  Chest radiograph December 22, 2016; chest CT August 14, 2017 FINDINGS: Cardiovascular: There is no thoracic aortic aneurysm or dissection. There is calcification at the origins of the left common carotid and left subclavian arteries. Mild scattered calcification is noted elsewhere the great vessels. There is aortic atherosclerosis. There are foci of native coronary artery calcification. The patient is status post coronary artery bypass grafting. There is no major vessel pulmonary embolus. No pericardial effusion or pericardial thickening is evident. Mediastinum/Nodes: Thyroid appears normal. There are multiple subcentimeter mediastinal lymph nodes. There is a subcarinal lymph node measuring 1.3 x 1.0 cm. There is a second sub- carinal lymph node measuring 1.2 x 1.0 cm. There is a small hiatal hernia. Lungs/Pleura: There is underlying bullous emphysematous change. Extensive bullous disease is noted in both upper lobes. In comparison with the previous study, there is a bulla in the posterior segment left upper lobe measuring 7.7 x 4.6 cm. The wall of this bulla has become considerably thicker compared to most recent study. There is also thickening along the nearby major fissure with localized consolidation adjacent to the major fissure in the posterior segment of the left upper lobe. There is no other area of consolidation. There are areas of scarring and atelectatic change in the lung bases. There are scattered areas of fibrosis which appear stable. There is a nodular opacity along the right major fissure measuring 9 x 5 mm, stable and likely representing an intrafissural lymph node. Upper Abdomen: Gallbladder is absent. Spleen is incompletely visualized. Spleen measures 13.8 cm from anterior to posterior dimension which is prominent. There is upper abdominal  aortic atherosclerosis. Musculoskeletal: No blastic or lytic bone lesions are evident. Patient is status post median sternotomy. No chest wall lesions are evident. IMPRESSION: 1. 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 These changes are similar compared to prior study. Note, however, that the largest bulla, located in the posterior segment left upper lobe, shows considerable wall thickening, a change from the previous study. There is a nearby area of airspace consolidation in the posterior segment of the left upper lobe adjacent to the major fissure, felt to represent pneumonia. The thickening of the wall of the bulla is most likely due to infection of this bulla. Atypical cystic neoplasm may present in this manner and cannot be entirely excluded at this time. Note that there is no evidence of mycetoma in this bulla or elsewhere. Given this circumstance, would advise repeat CT after treatment for pneumonia to assess for stability or possible resolution of wall thickening in this bulla. Given the possibility of underlying neoplasm, close clinical and imaging surveillance advised in this regard. 2. Mildly prominent sub-carinal lymph nodes of uncertain etiology. These lymph nodes could well be reactive given the parenchymal lung changes. 3. Status post coronary artery bypass grafting. There is aortic and great vessel atherosclerotic calcification as well as calcification in native coronary arteries. 4.  Small hiatal hernia. 5.  Spleen appears enlarged although incompletely visualized. 6.  Gallbladder surgically absent. Aortic  Atherosclerosis (ICD10-I70.0) and Emphysema (ICD10-J43.9). Electronically Signed   By: Bretta Bang III M.D.   On: 12/22/2017 13:17   Dg Chest Port 1 View  Result Date: 12/27/2017 CLINICAL DATA:  Sepsis. EXAM: PORTABLE CHEST 1 VIEW COMPARISON:  Chest radiograph 12/27/2017, chest CT 12/22/2017 FINDINGS: Postsurgical changes from CABG.  Cardiomediastinal silhouette is normal. Mediastinal contours appear intact. Emphysema, chronic interstitial lung changes. Previously noted thick-walled cystic structure in the left upper lobe has increased in size no measuring 7.6 cm, with increased surrounding airspace consolidation. Osseous structures are without acute abnormality. Soft tissues are grossly normal. IMPRESSION: Known left upper lobe thick-walled cavitary lesion has increased in size with increased surrounding airspace consolidation. Emphysema with chronic interstitial lung changes. Electronically Signed   By: Ted Mcalpine M.D.   On: 12/27/2017 17:34   Dg Chest Port 1 View  Result Date: 12/24/2017 CLINICAL DATA:  Pneumonia. EXAM: PORTABLE CHEST 1 VIEW COMPARISON:  CT scan and radiographs of December 22, 2017. FINDINGS: Stable cardiomediastinal silhouette. Status post coronary artery bypass graft. No pneumothorax is noted. Stable scarring is seen throughout both lungs, most prominently seen in right upper lobe. Left upper lobe bulla is again noted with significant wall thickening consistent with infection and surrounding pneumonia. Bony thorax is unremarkable. IMPRESSION: Left upper lobe bulla is again noted with significant wall thickening consistent with infection and surrounding pneumonia as described on prior CT scan. Electronically Signed   By: Lupita Raider, M.D.   On: 12/24/2017 10:00    Lab Data:  CBC: Recent Labs  Lab 12/26/17 0612 12/27/17 1721 12/28/17 0331 12/30/17 0715 12/31/17 0348 01/01/18 0401  WBC 3.0* 4.5 2.3* 2.5* 2.7* 2.9*  NEUTROABS 1.1* 1.3*  --  0.6* 0.6* 0.6*  HGB 11.2* 13.1 11.5* 10.9* 9.7* 10.5*  HCT 33.7* 39.0 34.8* 33.0* 29.6* 32.0*  MCV 86.9 85.2 86.4 86.6 85.1 86.5  PLT 133* 183 156 181 174 193   Basic Metabolic Panel: Recent Labs  Lab 12/27/17 1721 12/28/17 0331 12/30/17 0715 12/31/17 0348 12/31/17 1542 01/01/18 0401  NA 135 135 143 143  --  142  K 3.2* 3.3* 2.9* 2.9* 4.1 3.3*  CL  103 109 111 110  --  105  CO2 20* 16* 24 25  --  29  GLUCOSE 147* 146* 111* 112*  --  110*  BUN 10 10 8  5*  --  7  CREATININE 0.80 0.72 0.78 0.56*  --  0.52*  CALCIUM 9.2 8.3* 8.4* 8.1*  --  8.5*  MG  --   --  1.6* 1.9  --   --   PHOS  --   --  2.4*  --   --   --    GFR: Estimated Creatinine Clearance: 107 mL/min (A) (by C-G formula based on SCr of 0.52 mg/dL (L)). Liver Function Tests: Recent Labs  Lab 12/27/17 1721 12/30/17 0715 12/31/17 0348  AST 32 25 21  ALT 37 22 19  ALKPHOS 76 53 50  BILITOT 1.1 0.9 0.7  PROT 7.2 5.5* 5.1*  ALBUMIN 3.1* 2.5* 2.4*   No results for input(s): LIPASE, AMYLASE in the last 168 hours. No results for input(s): AMMONIA in the last 168 hours. Coagulation Profile: No results for input(s): INR, PROTIME in the last 168 hours. Cardiac Enzymes: No results for input(s): CKTOTAL, CKMB, CKMBINDEX, TROPONINI in the last 168 hours. BNP (last 3 results) No results for input(s): PROBNP in the last 8760 hours. HbA1C: No results for input(s): HGBA1C in the last 72  hours. CBG: Recent Labs  Lab 12/26/17 1214 12/28/17 1016 12/28/17 1811 12/29/17 0832 12/30/17 0801  GLUCAP 120* 147* 157* 103* 112*   Lipid Profile: No results for input(s): CHOL, HDL, LDLCALC, TRIG, CHOLHDL, LDLDIRECT in the last 72 hours. Thyroid Function Tests: No results for input(s): TSH, T4TOTAL, FREET4, T3FREE, THYROIDAB in the last 72 hours. Anemia Panel: No results for input(s): VITAMINB12, FOLATE, FERRITIN, TIBC, IRON, RETICCTPCT in the last 72 hours. Urine analysis:    Component Value Date/Time   COLORURINE YELLOW 12/30/2017 0414   APPEARANCEUR HAZY (A) 12/30/2017 0414   LABSPEC 1.011 12/30/2017 0414   PHURINE 6.0 12/30/2017 0414   GLUCOSEU NEGATIVE 12/30/2017 0414   HGBUR LARGE (A) 12/30/2017 0414   HGBUR negative 04/12/2008 1103   BILIRUBINUR NEGATIVE 12/30/2017 0414   KETONESUR NEGATIVE 12/30/2017 0414   PROTEINUR NEGATIVE 12/30/2017 0414   UROBILINOGEN 0.2  10/17/2012 1236   NITRITE NEGATIVE 12/30/2017 0414   LEUKOCYTESUR NEGATIVE 12/30/2017 0414     Carynn Felling M.D. [facility] 01/01/2018, 2:08 PM  Pager: [phone] Between 7am to 7pm - call Pager - [phone]  After 7pm go to [email or web address] - password [email or web address]  Call night coverage person covering after 7pm

## 2018-01-02 ENCOUNTER — Inpatient Hospital Stay (HOSPITAL_COMMUNITY): Payer: Medicare HMO

## 2018-01-02 ENCOUNTER — Other Ambulatory Visit: Payer: Self-pay

## 2018-01-02 DIAGNOSIS — J439 Emphysema, unspecified: Secondary | ICD-10-CM

## 2018-01-02 LAB — CBC WITH DIFFERENTIAL/PLATELET
BASOS ABS: 0 10*3/uL (ref 0.0–0.1)
Band Neutrophils: 4 %
Basophils Relative: 0 %
Blasts: 0 %
EOS PCT: 1 %
Eosinophils Absolute: 0 10*3/uL (ref 0.0–0.7)
HEMATOCRIT: 33.5 % — AB (ref 39.0–52.0)
Hemoglobin: 10.8 g/dL — ABNORMAL LOW (ref 13.0–17.0)
LYMPHS ABS: 1.8 10*3/uL (ref 0.7–4.0)
Lymphocytes Relative: 62 %
MCH: 27.6 pg (ref 26.0–34.0)
MCHC: 32.2 g/dL (ref 30.0–36.0)
MCV: 85.7 fL (ref 78.0–100.0)
METAMYELOCYTES PCT: 0 %
MONOS PCT: 11 %
Monocytes Absolute: 0.3 10*3/uL (ref 0.1–1.0)
Myelocytes: 0 %
NEUTROS ABS: 0.8 10*3/uL — AB (ref 1.7–7.7)
NRBC: 0 /100{WBCs}
Neutrophils Relative %: 22 %
Other: 0 %
Platelets: 231 10*3/uL (ref 150–400)
Promyelocytes Relative: 0 %
RBC: 3.91 MIL/uL — AB (ref 4.22–5.81)
RDW: 16.3 % — AB (ref 11.5–15.5)
WBC: 2.9 10*3/uL — AB (ref 4.0–10.5)

## 2018-01-02 LAB — GLUCOSE, CAPILLARY
Glucose-Capillary: 95 mg/dL (ref 65–99)
Glucose-Capillary: 168 mg/dL — ABNORMAL HIGH (ref 65–99)
Glucose-Capillary: 153 mg/dL — ABNORMAL HIGH (ref 65–99)

## 2018-01-02 LAB — BASIC METABOLIC PANEL
Anion gap: 10 (ref 5–15)
BUN: 8 mg/dL (ref 6–20)
CHLORIDE: 102 mmol/L (ref 101–111)
CO2: 27 mmol/L (ref 22–32)
Calcium: 8.7 mg/dL — ABNORMAL LOW (ref 8.9–10.3)
Creatinine, Ser: 0.52 mg/dL — ABNORMAL LOW (ref 0.61–1.24)
GFR calc Af Amer: >60 mL/min (ref >60)
GFR calc non Af Amer: >60 mL/min (ref >60)
Glucose, Bld: 104 mg/dL — ABNORMAL HIGH (ref 65–99)
POTASSIUM: 3.6 mmol/L (ref 3.5–5.1)
SODIUM: 139 mmol/L (ref 135–145)

## 2018-01-02 LAB — ACID FAST SMEAR (AFB, MYCOBACTERIA): Acid Fast Smear: NEGATIVE

## 2018-01-02 MED ORDER — ORAL CARE MOUTH RINSE
15.0000 mL | Freq: Two times a day (BID) | OROMUCOSAL | Status: DC
  Administered 2018-01-02 – 2018-01-09 (×10): 15 mL via OROMUCOSAL

## 2018-01-02 NOTE — Telephone Encounter (Signed)
Forms signed and faxed to [facility] at [phone]. Forms sent for scanning.

## 2018-01-02 NOTE — Progress Notes (Signed)
Physical Therapy Treatment Patient Details Name: Joseph [name] MRN: [id number] DOB: 03/25/1951 Today's Date: 01/02/2018    History of Present Illness Pt is a 67 y/o male admitted secondary to fever and diarrhea. Pt with recent admission to [facility] for pneumonia. PMH includes DM, HTN, CAD, dCHF, COPD on 3L of oxygen at home, and RA.     PT Comments    Pt progressing well with mobility, he ambulated 250' with RW and 3L O2, with no loss of balance. SaO2 98% on 3L. Distance limited by fatigue.   Follow Up Recommendations  Supervision/Assistance - 24 hour;Home health PT     Equipment Recommendations  Wheelchair (measurements PT);Wheelchair cushion (measurements PT)(lightweight transport WC for longer distance mobility )    Recommendations for Other Services OT consult     Precautions / Restrictions Precautions Precautions: Fall Restrictions Weight Bearing Restrictions: No    Mobility  Bed Mobility Overal bed mobility: Needs Assistance Bed Mobility: Supine to Sit     Supine to sit: Mod assist     General bed mobility comments: assist to bring trunk upright into sitting  Transfers Overall transfer level: Needs assistance Equipment used: Bilateral platform walker Transfers: Sit to/from Stand Sit to Stand: Min guard;From elevated surface         General transfer comment: Demonstrated safe hand placement.   Ambulation/Gait Ambulation/Gait assistance: Min guard Ambulation Distance (Feet): 250 Feet Assistive device: Bilateral platform walker Gait Pattern/deviations: Step-through pattern;Trunk flexed;Decreased stride length Gait velocity: Decreased    General Gait Details: Slow, cautious gait. Verbal cues for upright posture and positioning in RW. Further distance limited secondary to faituge. SaO2 98% on 3L O2. No LOB   Stairs             Wheelchair Mobility    Modified Rankin (Stroke Patients Only)       Balance Overall balance assessment: Needs  assistance Sitting-balance support: No upper extremity supported Sitting balance-Leahy Scale: Good     Standing balance support: Bilateral upper extremity supported;During functional activity Standing balance-Leahy Scale: Poor Standing balance comment: Reliant on BUE support.                             Cognition Arousal/Alertness: Awake/alert Behavior During Therapy: WFL for tasks assessed/performed Overall Cognitive Status: Within Functional Limits for tasks assessed                                        Exercises      General Comments        Pertinent Vitals/Pain Pain Score: 5  Pain Location: low back at SI joint  Pain Descriptors / Indicators: Aching Pain Intervention(s): Limited activity within patient's tolerance;Monitored during session;Premedicated before session    Home Living                      Prior Function            PT Goals (current goals can now be found in the care plan section) Acute Rehab PT Goals Patient Stated Goal: to go home  PT Goal Formulation: With patient/family Time For Goal Achievement: 01/14/18 Potential to Achieve Goals: Good Progress towards PT goals: Progressing toward goals    Frequency    Min 3X/week      PT Plan Current plan remains appropriate    Co-evaluation  AM-PAC PT "6 Clicks" Daily Activity  Outcome Measure  Difficulty turning over in bed (including adjusting bedclothes, sheets and blankets)?: None Difficulty moving from lying on back to sitting on the side of the bed? : A Little Difficulty sitting down on and standing up from a chair with arms (e.g., wheelchair, bedside commode, etc,.)?: Unable Help needed moving to and from a bed to chair (including a wheelchair)?: A Little Help needed walking in hospital room?: A Little Help needed climbing 3-5 steps with a railing? : A Lot 6 Click Score: 16    End of Session Equipment Utilized During Treatment:  Gait belt Activity Tolerance: Patient tolerated treatment well Patient left: in bed;with call bell/phone within reach;with family/visitor present(sitting EOB ) Nurse Communication: Mobility status PT Visit Diagnosis: Unsteadiness on feet (R26.81);Other abnormalities of gait and mobility (R26.89);Muscle weakness (generalized) (M62.81)     Time: 0240-9735 PT Time Calculation (min) (ACUTE ONLY): 35 min  Charges:  $Gait Training: 8-22 mins $Therapeutic Activity: 8-22 mins                    G Codes:         Tamala Ser 01/02/2018, 11:40 AM [phone]

## 2018-01-02 NOTE — Progress Notes (Signed)
PULMONARY / CRITICAL CARE MEDICINE   Name: Joseph [name] MRN: [id number] DOB: 01/04/1951    ADMISSION DATE:  12/27/2017 CONSULTATION DATE:  12/31/2017  REFERRING MD:  Dr. Isidoro Donning, [facility]  CHIEF COMPLAINT:  Fever  HISTORY OF PRESENT ILLNESS:   67 yo male well known to pulmonary service was recently tx for respiratory infection with ABx.  Developed fever and diarrhea and admitted.  He has hx of COPD with emphysema, RA, chronic hypoxic respiratory failure.  His CT chest from April showed thickening of bulla in Lt upper lung.  This was associated with increased productive cough and pleuritic chest pain on Lt side.  He was started on ABx this admission.  His GI infection studies were negative.  He had persistent diarrhea.  ABx were stopped and diarrhea has improved.  He is not having chest pain, hemoptysis, sinus congestion, or sore throat.  Breathing is at baseline as well as oxygen needs.  SUBJECTIVE:  Diarrhea better.  VITAL SIGNS: BP 134/74 (BP Location: Left Arm)   Pulse 63   Temp 98.4 F (36.9 C)   Resp 18   Ht 6\' 2"  (1.88 m)   Wt 213 lb (96.6 kg)   SpO2 98%   BMI 27.35 kg/m   INTAKE / OUTPUT: I/O last 3 completed shifts: In: -  Out: 950 [Urine:950]  PHYSICAL EXAMINATION:  General - pleasant Eyes - pupils reactive ENT - no sinus tenderness, no oral exudate, no LAN Cardiac - regular, no murmur Chest - no wheeze, rales Abd - soft, non tender Ext - changes of RA Skin - no rashes Neuro - normal strength Psych - normal mood  LABS:  BMET Recent Labs  Lab 12/31/17 0348 12/31/17 1542 01/01/18 0401 01/02/18 0657  NA 143  --  142 139  K 2.9* 4.1 3.3* 3.6  CL 110  --  105 102  CO2 25  --  29 27  BUN 5*  --  7 8  CREATININE 0.56*  --  0.52* 0.52*  GLUCOSE 112*  --  110* 104*    Electrolytes Recent Labs  Lab 12/30/17 0715 12/31/17 0348 01/01/18 0401 01/02/18 0657  CALCIUM 8.4* 8.1* 8.5* 8.7*  MG 1.6* 1.9  --   --   PHOS 2.4*  --   --   --     CBC Recent Labs   Lab 12/31/17 0348 01/01/18 0401 01/02/18 0657  WBC 2.7* 2.9* 2.9*  HGB 9.7* 10.5* 10.8*  HCT 29.6* 32.0* 33.5*  PLT 174 193 231    Coag's No results for input(s): APTT, INR in the last 168 hours.  Sepsis Markers Recent Labs  Lab 12/27/17 1729 12/28/17 0331  LATICACIDVEN 1.58 0.9  PROCALCITON  --  0.13    ABG No results for input(s): PHART, PCO2ART, PO2ART in the last 168 hours.  Liver Enzymes Recent Labs  Lab 12/27/17 1721 12/30/17 0715 12/31/17 0348  AST 32 25 21  ALT 37 22 19  ALKPHOS 76 53 50  BILITOT 1.1 0.9 0.7  ALBUMIN 3.1* 2.5* 2.4*    Cardiac Enzymes No results for input(s): TROPONINI, PROBNP in the last 168 hours.  Glucose Recent Labs  Lab 12/28/17 1811 12/29/17 0832 12/30/17 0801 01/01/18 2141 01/02/18 0810 01/02/18 1207  GLUCAP 157* 103* 112* 102* 95 168*    Imaging No results found.   STUDIES:  CT chest 12/22/17 >> bullous emphysema, thick walled bulla Lt upper lobe and localized consolidation  CULTURES: C diff 5/04 >> negative Stool PCR 5/04 >>  negative Blood 5/04 >> negative Sputum 5/09 >> Sputum AFB 5/09 >> Sputum Fungal 5/09 >>   ANTIBIOTICS: Vancomycin 5/04 >> 5/06 Flagyl 5/04 >> 5/04 Cefepime 5/04 >> 5/06  DISCUSSION: 66 yo male with recent infected bulla in setting of COPD with bullous emphysema, bronchiectasis, advanced RA, and chronic hypoxia.  He had fever and diarrhea.  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  He is on chronic prednisone.  His fever and diarrhea have improved off ABx.  His respiratory status appears to be at baseline.  It is likely that he had bacterial infection of bulla and has been treated with ABx successfully.  He could also have typical infection, but seems less likely given his clinical response.  CXR 01/02/18 shows more solid area of bulla but smaller in size.  ASSESSMENT / PLAN:  COPD with bullous emphysema. Acute on chronic hypoxic respiratory  failure. Bronchiectasis. Cavitary lesion Lt upper lung. - continue breo, incruse - oxygen to keep SpO2 90 to 95% - f/u sputum culture - defer ABx - will need f/u CT chest in 3 to 4 weeks  Neutropenia. - to be transferred to [facility]   Hx of GERD, hiatal hernia, and gastroparesis with recurrent aspiration. - continue protonix  Hx of rheumatoid arthritis. - continue prednisone  Updated wife at bedside  PCCM will f/u on 01/05/18 >> call if help needed sooner.  [name], MD [facility] Pulmonary/Critical Care 01/02/2018, 12:21 PM

## 2018-01-02 NOTE — Progress Notes (Signed)
[place] TEAM 1 - Stepdown/ICU TEAM  JAVANI PFAUTZ  VFM:[id number] DOB: 12-28-1950 DOA: 12/27/2017 PCP: Lennart Pall, MD    Brief Narrative:  67 y.o. male with a hx of hypertension, hyperlipidemia, COPD, chronic respiratory failure on 3 L [place], GERD, CAD, gastroparesis, psoriasis, rheumatoid arthritis, pancytopenia, and diastolic CHF who presented with diarrhea and fever.  He was hospitalized 4/29-5/3 due to [facility] discharged home on Augmentin.  He developed severe diarrhea the following day w/ more than 20 watery bowel movements and a fever of 102.   ED w/u revealed WBC 4.5, lactic acid 1.58, negative urinalysis, negative C. difficile antigen toxin, temperature 102.8, hypotensive with blood pressure 87/59, tachypnea, oxygen saturation 100% on 3 L nasal cannula oxygen.  Chest x-ray showed known left upper lobe thick-walled cavitary lesion with increased size and increased surrounding airspace consolidation.    Subjective: His diarrhea has essentially resolved.  He is tolerating intake without significant difficulty.  He denies chest pain shortness of breath nausea vomiting fevers or chills.   Assessment & Plan:  SIRS  SIRS physiology has resolved w/ simple volume expansion   Diarrhea C. difficile antigen and toxin negative - GI panel negative - most c/w antibiotic associated non-infectious diarrhea -stopped all antibiotics - initiated Imodium - resolved    HCAP / infected pulmonary bulla - Hospitalized 4/29 > 5/3 Clinically the patient appears much improved from a respiratory standpoint - no new recs per Pulmonary   Chronic respiratory failure with hypoxiaand COPD Stable w/o acute exacerbation - continue usual home medications  Neutropenia ANC increased to 0.8 - ?Felty's syndrome (does not have splenomegaly but this does not rule it out) - WBC seems to be trending up - has been accepted for transfer to [facility] but no beds are available, or expected any time soon    Hypokalemia Corrected   Hypomagnesemia  follow  Type 2 diabetes, controlled, with neuropathy A1c6.1 08/15/17 -does not require use of diabetes medicines as an outpatient - CBG controlled  HTN BP remains stable w/o medical tx at this time   HLD Continue home medical therapy   CAD Presently asymptomatic - continue home medical therapy  GERD Cont lower dose of PPI   Rheumatoid arthritis Continue home dose of prednisone with no indication for stress dosing at this time  Chronic diastolic heart failure TTE 08/15/2017 noted EF 55-60% with grade 1 diastolic dysfunction -holding diuretic due to significant dehydration in face of diarrhea   Filed Weights   12/27/17 1725  Weight: 96.6 kg (213 lb)     DVT prophylaxis: Lovenox Code Status: FULL CODE Family Communication: no family present at time of exam  Disposition Plan: tele  Consultants:  None  Antimicrobials:  None  Objective: Blood pressure 108/61, pulse 86, temperature 97.8 F (36.6 C), temperature source Oral, resp. rate 16, height 6\' 2"  (1.88 m), weight 96.6 kg (213 lb), SpO2 99 %.  Intake/Output Summary (Last 24 hours) at 01/02/2018 1823 Last data filed at 01/02/2018 1654 Gross per 24 hour  Intake 360 ml  Output 650 ml  Net -290 ml   Filed Weights   12/27/17 1725  Weight: 96.6 kg (213 lb)    Examination: General: No acute respiratory distress - A&O Lungs: Clear to auscultation bilaterally - no wheeze Cardiovascular: RRR  Abdomen: Nontender, overweight, soft, bowel sounds positive, no rebound - no organomegally  Extremities: No edema B LE   CBC: Recent Labs  Lab 12/31/17 0348 01/01/18 0401 01/02/18 0657  WBC 2.7* 2.9*  2.9*  NEUTROABS 0.6* 0.6* 0.8*  HGB 9.7* 10.5* 10.8*  HCT 29.6* 32.0* 33.5*  MCV 85.1 86.5 85.7  PLT 174 193 231   Basic Metabolic Panel: Recent Labs  Lab 12/30/17 0715 12/31/17 0348 12/31/17 1542 01/01/18 0401 01/02/18 0657  NA 143 143  --  142 139  K 2.9*  2.9* 4.1 3.3* 3.6  CL 111 110  --  105 102  CO2 24 25  --  29 27  GLUCOSE 111* 112*  --  110* 104*  BUN 8 5*  --  7 8  CREATININE 0.78 0.56*  --  0.52* 0.52*  CALCIUM 8.4* 8.1*  --  8.5* 8.7*  MG 1.6* 1.9  --   --   --   PHOS 2.4*  --   --   --   --    GFR: Estimated Creatinine Clearance: 107 mL/min (A) (by C-G formula based on SCr of 0.52 mg/dL (L)).  Liver Function Tests: Recent Labs  Lab 12/27/17 1721 12/30/17 0715 12/31/17 0348  AST 32 25 21  ALT 37 22 19  ALKPHOS 76 53 50  BILITOT 1.1 0.9 0.7  PROT 7.2 5.5* 5.1*  ALBUMIN 3.1* 2.5* 2.4*    HbA1C: Hgb A1c MFr Bld  Date/Time Value Ref Range Status  08/15/2017 05:17 AM 6.1 (H) 4.8 - 5.6 % Final    Comment:    (NOTE) Pre diabetes:          5.7%-6.4% Diabetes:              >6.4% Glycemic control for   <7.0% adults with diabetes   06/24/2017 10:42 AM 6.5 4.6 - 6.5 % Final    Comment:    Glycemic Control Guidelines for People with Diabetes:Non Diabetic:  <6%Goal of Therapy: <7%Additional Action Suggested:  >8%     CBG: Recent Labs  Lab 12/30/17 0801 01/01/18 2141 01/02/18 0810 01/02/18 1207 01/02/18 1748  GLUCAP 112* 102* 95 168* 153*    Recent Results (from the past 240 hour(s))  Blood Culture (routine x 2)     Status: None   Collection Time: 12/27/17  5:21 PM  Result Value Ref Range Status   Specimen Description BLOOD LEFT FOREARM  Final   Special Requests   Final    BOTTLES DRAWN AEROBIC AND ANAEROBIC Blood Culture adequate volume   Culture   Final    NO GROWTH 5 DAYS Performed at [facility], [address] [place], [place] [address]    Report Status 01/01/2018 FINAL  Final  Blood Culture (routine x 2)     Status: None   Collection Time: 12/27/17  5:22 PM  Result Value Ref Range Status   Specimen Description BLOOD RIGHT WRIST  Final   Special Requests   Final    BOTTLES DRAWN AEROBIC AND ANAEROBIC Blood Culture adequate volume   Culture   Final    NO GROWTH 5 DAYS Performed at [facility], [address], [place], [place] [address]    Report Status 01/01/2018 FINAL  Final  C difficile quick scan w PCR reflex     Status: None   Collection Time: 12/27/17  8:10 PM  Result Value Ref Range Status   C Diff antigen NEGATIVE NEGATIVE Final   C Diff toxin NEGATIVE NEGATIVE Final   C Diff interpretation No C. difficile detected.  Final    Comment: Performed at [facility], [address] [place], [place] [address]  Gastrointestinal  Panel by PCR , Stool     Status: None   Collection Time: 12/27/17  8:10 PM  Result Value Ref Range Status   Campylobacter species NOT DETECTED NOT DETECTED Final   Plesimonas shigelloides NOT DETECTED NOT DETECTED Final   Salmonella species NOT DETECTED NOT DETECTED Final   Yersinia enterocolitica NOT DETECTED NOT DETECTED Final   Vibrio species NOT DETECTED NOT DETECTED Final   Vibrio cholerae NOT DETECTED NOT DETECTED Final   Enteroaggregative E coli (EAEC) NOT DETECTED NOT DETECTED Final   Enteropathogenic E coli (EPEC) NOT DETECTED NOT DETECTED Final   Enterotoxigenic E coli (ETEC) NOT DETECTED NOT DETECTED Final   Shiga like toxin producing E coli (STEC) NOT DETECTED NOT DETECTED Final   Shigella/Enteroinvasive E coli (EIEC) NOT DETECTED NOT DETECTED Final   Cryptosporidium NOT DETECTED NOT DETECTED Final   Cyclospora cayetanensis NOT DETECTED NOT DETECTED Final   Entamoeba histolytica NOT DETECTED NOT DETECTED Final   Giardia lamblia NOT DETECTED NOT DETECTED Final   Adenovirus F40/41 NOT DETECTED NOT DETECTED Final   Astrovirus NOT DETECTED NOT DETECTED Final   Norovirus GI/GII NOT DETECTED NOT DETECTED Final   Rotavirus A NOT DETECTED NOT DETECTED Final   Sapovirus (I, II, IV, and V) NOT DETECTED NOT DETECTED Final    Comment: Performed at [facility], [address] [place], [place] [address]  MRSA PCR Screening     Status: None   Collection Time: 12/28/17  5:27 PM  Result Value Ref Range Status   MRSA by  PCR NEGATIVE NEGATIVE Final    Comment:        The GeneXpert MRSA Assay (FDA approved for NASAL specimens only), is one component of a comprehensive MRSA colonization surveillance program. It is not intended to diagnose MRSA infection nor to guide or monitor treatment for MRSA infections. Performed at [facility], [address] [place], [place] [address]   Culture, expectorated sputum-assessment     Status: None   Collection Time: 12/31/17  6:53 PM  Result Value Ref Range Status   Specimen Description EXPECTORATED SPUTUM  Final   Special Requests   Final    NONE Performed at [facility], [address] [place], [place] [address]    Sputum evaluation   Final    Sputum specimen not acceptable for testing.  Please recollect.   Results Called to: J. THACKER, RN AT 2327 ON 12/31/17 BY C. JESSUP, MLT.    Report Status 12/31/2017 FINAL  Final  Acid Fast Smear (AFB)     Status: None   Collection Time: 12/31/17  6:53 PM  Result Value Ref Range Status   AFB Specimen Processing Concentration  Final   Acid Fast Smear Negative  Final    Comment: (NOTE) Performed At: [facility] [address] [place], [place] [id number] Jolene Schimke MD LG:[id number]    Source (AFB) SPUTUM  Final    Comment: Performed at [facility], [address] [place], [place] [address]     Scheduled Meds: . aspirin EC  81 mg Oral Daily  . atorvastatin  80 mg Oral QHS  . enoxaparin (LOVENOX) injection  40 mg Subcutaneous Q24H  . ferrous sulfate  325 mg Oral Q breakfast  . fluticasone furoate-vilanterol  1 puff Inhalation Daily  . folic acid  1,000 mcg Oral Daily  . loteprednol  1 drop Both Eyes q morning - 10a  . mouth rinse  15 mL Mouth Rinse BID  .  multivitamin  1 tablet Oral Daily  . pantoprazole  40 mg Oral Daily  . predniSONE  15 mg Oral Q breakfast  . pregabalin  75 mg Oral BID  . umeclidinium bromide  1 puff Inhalation Daily  . vitamin C  500 mg Oral Daily      LOS: 5 days   Lonia Blood, MD [facility] Office  [phone] Pager - Text Page per Amion as per below:  On-Call/Text Page:      [email or web address]      password [email or web address]  If 7PM-7AM, please contact night-coverage [email or web address] Password [email or web address] 01/02/2018, 6:23 PM

## 2018-01-03 DIAGNOSIS — M359 Systemic involvement of connective tissue, unspecified: Secondary | ICD-10-CM

## 2018-01-03 DIAGNOSIS — D704 Cyclic neutropenia: Secondary | ICD-10-CM

## 2018-01-03 DIAGNOSIS — R6510 Systemic inflammatory response syndrome (SIRS) of non-infectious origin without acute organ dysfunction: Secondary | ICD-10-CM

## 2018-01-03 LAB — COMPREHENSIVE METABOLIC PANEL
ALK PHOS: 54 U/L (ref 38–126)
ALT: 19 U/L (ref 17–63)
ANION GAP: 7 (ref 5–15)
AST: 21 U/L (ref 15–41)
Albumin: 2.6 g/dL — ABNORMAL LOW (ref 3.5–5.0)
BILIRUBIN TOTAL: 0.9 mg/dL (ref 0.3–1.2)
BUN: 11 mg/dL (ref 6–20)
CALCIUM: 8.7 mg/dL — AB (ref 8.9–10.3)
CO2: 28 mmol/L (ref 22–32)
Chloride: 103 mmol/L (ref 101–111)
Creatinine, Ser: 0.56 mg/dL — ABNORMAL LOW (ref 0.61–1.24)
GFR calc non Af Amer: >60 mL/min (ref >60)
GLUCOSE: 106 mg/dL — AB (ref 65–99)
Potassium: 3.5 mmol/L (ref 3.5–5.1)
Sodium: 138 mmol/L (ref 135–145)
TOTAL PROTEIN: 6 g/dL — AB (ref 6.5–8.1)

## 2018-01-03 LAB — CBC WITH DIFFERENTIAL/PLATELET
BASOS PCT: 0 %
Basophils Absolute: 0 10*3/uL (ref 0.0–0.1)
Eosinophils Absolute: 0.1 10*3/uL (ref 0.0–0.7)
Eosinophils Relative: 2 %
HEMATOCRIT: 33.3 % — AB (ref 39.0–52.0)
HEMOGLOBIN: 11.1 g/dL — AB (ref 13.0–17.0)
LYMPHS PCT: 60 %
Lymphs Abs: 1.6 10*3/uL (ref 0.7–4.0)
MCH: 28.6 pg (ref 26.0–34.0)
MCHC: 33.3 g/dL (ref 30.0–36.0)
MCV: 85.8 fL (ref 78.0–100.0)
MONOS PCT: 20 %
Monocytes Absolute: 0.5 10*3/uL (ref 0.1–1.0)
NEUTROS ABS: 0.5 10*3/uL — AB (ref 1.7–7.7)
Neutrophils Relative %: 18 %
Platelets: 241 10*3/uL (ref 150–400)
RBC: 3.88 MIL/uL — ABNORMAL LOW (ref 4.22–5.81)
RDW: 16.3 % — ABNORMAL HIGH (ref 11.5–15.5)
Smear Review: ADEQUATE
WBC: 2.7 10*3/uL — ABNORMAL LOW (ref 4.0–10.5)

## 2018-01-03 LAB — MAGNESIUM: Magnesium: 1.5 mg/dL — ABNORMAL LOW (ref 1.7–2.4)

## 2018-01-03 MED ORDER — HYDROCORTISONE 1 % EX CREA: 1.0000 "application " | TOPICAL_CREAM | Freq: Three times a day (TID) | CUTANEOUS | 0 refills | Status: DC | PRN

## 2018-01-03 MED ORDER — FOLIC ACID 1 MG PO TABS: 1000.0000 ug | ORAL_TABLET | Freq: Every day | ORAL | Status: DC

## 2018-01-03 MED ORDER — ACETAMINOPHEN 325 MG PO TABS: 650.0000 mg | ORAL_TABLET | Freq: Four times a day (QID) | ORAL | Status: DC | PRN

## 2018-01-03 MED ORDER — ASCORBIC ACID 500 MG PO TABS: 500.0000 mg | ORAL_TABLET | Freq: Every day | ORAL | Status: DC

## 2018-01-03 MED ORDER — ONDANSETRON HCL 4 MG/2ML IJ SOLN: 4.0000 mg | Freq: Three times a day (TID) | INTRAMUSCULAR | 0 refills | Status: DC | PRN

## 2018-01-03 MED ORDER — MELATONIN 3 MG PO TABS: 9.0000 mg | ORAL_TABLET | Freq: Every evening | ORAL | 0 refills | Status: DC | PRN

## 2018-01-03 MED ORDER — ATORVASTATIN CALCIUM 80 MG PO TABS: 80.0000 mg | ORAL_TABLET | Freq: Every day | ORAL | Status: DC

## 2018-01-03 MED ORDER — NITROGLYCERIN 0.4 MG SL SUBL: 0.4000 mg | SUBLINGUAL_TABLET | SUBLINGUAL | 12 refills | Status: DC | PRN

## 2018-01-03 MED ORDER — FLUTICASONE FUROATE-VILANTEROL 100-25 MCG/INH IN AEPB: 1.0000 | INHALATION_SPRAY | Freq: Every day | RESPIRATORY_TRACT | Status: DC

## 2018-01-03 MED ORDER — LOPERAMIDE HCL 2 MG PO CAPS: 2.0000 mg | ORAL_CAPSULE | ORAL | 0 refills | Status: DC | PRN

## 2018-01-03 MED ORDER — PROSIGHT PO TABS: 1.0000 | ORAL_TABLET | Freq: Every day | ORAL | 0 refills | Status: DC

## 2018-01-03 MED ORDER — CLOBETASOL PROPIONATE 0.05 % EX CREA: 1.0000 "application " | TOPICAL_CREAM | Freq: Two times a day (BID) | CUTANEOUS | 0 refills | Status: DC | PRN

## 2018-01-03 MED ORDER — DM-GUAIFENESIN ER 30-600 MG PO TB12: 1.0000 | ORAL_TABLET | Freq: Two times a day (BID) | ORAL | Status: DC | PRN

## 2018-01-03 MED ORDER — UMECLIDINIUM BROMIDE 62.5 MCG/INH IN AEPB
1.0000 | INHALATION_SPRAY | Freq: Every day | RESPIRATORY_TRACT | Status: DC
  Filled 2018-01-03: qty 7

## 2018-01-03 MED ORDER — UMECLIDINIUM BROMIDE 62.5 MCG/INH IN AEPB: 1.0000 | INHALATION_SPRAY | Freq: Every day | RESPIRATORY_TRACT | Status: DC

## 2018-01-03 MED ORDER — MAGNESIUM SULFATE 2 GM/50ML IV SOLN
2.0000 g | Freq: Once | INTRAVENOUS | Status: AC
  Administered 2018-01-03: 2 g via INTRAVENOUS
  Filled 2018-01-03: qty 50

## 2018-01-03 MED ORDER — ENOXAPARIN SODIUM 40 MG/0.4ML ~~LOC~~ SOLN: 40.0000 mg | SUBCUTANEOUS | Status: DC

## 2018-01-03 MED ORDER — FLUTICASONE FUROATE-VILANTEROL 100-25 MCG/INH IN AEPB
1.0000 | INHALATION_SPRAY | Freq: Every day | RESPIRATORY_TRACT | Status: DC
  Filled 2018-01-03: qty 28

## 2018-01-03 MED ORDER — PANTOPRAZOLE SODIUM 40 MG PO TBEC: 40.0000 mg | DELAYED_RELEASE_TABLET | Freq: Every day | ORAL | Status: DC

## 2018-01-03 MED ORDER — FERROUS SULFATE 325 (65 FE) MG PO TABS: 325.0000 mg | ORAL_TABLET | Freq: Every day | ORAL | 3 refills | Status: DC

## 2018-01-03 MED ORDER — LOTEPREDNOL ETABONATE 0.5 % OP SUSP: 1.0000 [drp] | Freq: Every morning | OPHTHALMIC | 0 refills | Status: DC

## 2018-01-03 MED ORDER — ALBUTEROL SULFATE (2.5 MG/3ML) 0.083% IN NEBU: 2.5000 mg | INHALATION_SOLUTION | RESPIRATORY_TRACT | 12 refills | Status: DC | PRN

## 2018-01-03 MED ORDER — OXYCODONE-ACETAMINOPHEN 7.5-325 MG PO TABS: 1.0000 | ORAL_TABLET | ORAL | 0 refills | Status: DC | PRN

## 2018-01-03 NOTE — Discharge Summary (Signed)
DISCHARGE SUMMARY  ABDULLOH ULLOM  MR#: [id number]  DOB:01-17-1951  Date of Admission: 12/27/2017 Date of Discharge: 01/03/2018  Attending Physician:Jeffrey Silvestre Gunner, MD   Patient's WUJ:[id number], [name], MD  Consults: PCCM  Disposition: Transfer to [facility]   Follow-up Appts: Follow-up Information    [facility] Follow up.   Specialty:  Home Health Services Why:  resumption of home heath services arranged Contact information: [address] [place] [address] [phone]          Discharge Diagnoses: SIRS Persistent Idiopathic Neutropenia Severe Rheumatoid Arthritis w/o acute flair Diarrhea HCAP / infected pulmonary bulla - Hospitalized 4/29 > 5/3 Chronic respiratory failure with hypoxiaand COPD Hypokalemia Hypomagnesemia  Type 2 diabetes, controlled, with neuropathy HTN HLD CAD GERD Chronic diastolic heart failure  Initial presentation: 67 y.o.malewith a hx ofhypertension, hyperlipidemia, COPD, chronic respiratory failure on 3 L [place], GERD, CAD, gastroparesis, psoriasis, severe Rheumatoid Arthritis, pancytopenia,and diastolic CHF who presented with diarrhea and fever.  He was hospitalized at [facility] 4/29-5/3 due to HCAP / an infected pulmonary bulla, and discharged home on Augmentin. He developed severe diarrhea the following day w/ morethan 20 watery bowel movements and a fever of 102, and returned to the ED.   ED w/u revealed WBC 4.5 (ANC 1.3), lactic acid 1.58, negative urinalysis, negative C. difficile antigen toxin, temperature 102.8, hypotension with blood pressure 87/59, tachypnea, oxygen saturation 100% on 3 L nasal cannula oxygen. Chest x-ray showed known left upper lobe thick-walled cavitary lesionwithincreased sizeandincreased surrounding airspace consolidation.  Hospital Course:  SIRS  SIRS physiology resolved w/ simple volume expansion - despite initial concern for a worsening pulmonary infection, this  proved not to be the case w/ the pt having no sx whatsoever of an active pulmonary infection - PCCM was consulted who agreed w/ stopping abx - he has remained stable from a respiratory standpoint th/o the hospital stay, and has had no further fever since that recorded at the time of his admit   Persistent Idiopathic Neutropenia ANC was 1.3 at time of admit, but all subsequent f/u labs have been 0.5-0.8 - Felty's syndrome (does not have splenomegaly but this does not rule it out) is in the differential - thankfully the pt is not febrile and is not otherwise acutely ill - he remains on his usual chronic dose of prednisone - Neupogen has not been attempted as the pt has been pending transfer to [facility] for further evaluation and tx - we have been informed he has a bed today, and he is presently safe for transport   Diarrhea C. difficile antigen and toxin negative - GI panel negative - most c/w antibiotic associated non-infectious diarrhea - stopped all antibiotics - initiated Imodium - resolved w/ no persisting sx to suggest active infection   HCAP / infected pulmonary bulla - Hospitalized 4/29 > 5/3 Clinically the patient appears much improved from a respiratory standpoint - no new recs per Pulmonary - see discussion above - has been off abx since 12/29/17  Chronic respiratory failure with hypoxiaand COPD Stable w/o acute exacerbation - continue usual home medications and prn [place] support   Hypokalemia Corrected - supplement further to goal of 4.0 - due to GI losses and poor intake   Hypomagnesemia  Replace further and monitor levels - due to GI losses and poor intake   Type 2 diabetes, controlled, with neuropathy A1c6.1 08/15/17 - does not require use of diabetes medicines as an outpatient -  CBG controlled  HTN BP remains stable w/o medical tx at this time   HLD Continue home medical therapy   CAD Asymptomatic th/o this hospital stay - continue home medical  therapy  GERD Cont PPI   Rheumatoid arthritis Continue home dose of prednisone with no indication for stress dosing at this time - no clinical evidence of an acute flair   Chronic diastolic heart failure TTE 08/15/2017 noted EF 55-60% with grade 1 diastolic dysfunction - holding diuretic due to euvolemic state on exam and poor intake    Allergies as of 01/03/2018   No Known Allergies     Medication List    STOP taking these medications   albuterol 108 (90 Base) MCG/ACT inhaler Commonly known as:  PROAIR HFA Replaced by:  albuterol (2.5 MG/3ML) 0.083% nebulizer solution   amoxicillin-clavulanate 875-125 MG tablet Commonly known as:  AUGMENTIN   aspirin EC 81 MG tablet   furosemide 20 MG tablet Commonly known as:  LASIX   ipratropium-albuterol 0.5-2.5 (3) MG/3ML Soln Commonly known as:  DUONEB   LOTEMAX 0.5 % Gel Generic drug:  Loteprednol Etabonate Replaced by:  loteprednol 0.5 % ophthalmic suspension   Melatonin 1 MG Caps Replaced by:  Melatonin 3 MG Tabs   PERFECT IRON PO   polyethylene glycol packet Commonly known as:  MIRALAX / GLYCOLAX   potassium chloride 10 MEQ tablet Commonly known as:  KLOR-CON 10   pregabalin 75 MG capsule Commonly known as:  LYRICA   sodium chloride 0.65 % Soln nasal spray Commonly known as:  OCEAN   TRELEGY ELLIPTA 100-62.5-25 MCG/INH Aepb Generic drug:  Fluticasone-Umeclidin-Vilant   VITEYES AREDS ADVANCED PO Replaced by:  multivitamin Tabs tablet     TAKE these medications   acetaminophen 325 MG tablet Commonly known as:  TYLENOL Take 2 tablets (650 mg total) by mouth every 6 (six) hours as needed for mild pain, fever or headache.   albuterol (2.5 MG/3ML) 0.083% nebulizer solution Commonly known as:  PROVENTIL Take 3 mLs (2.5 mg total) by nebulization every 2 (two) hours as needed for wheezing. Replaces:  albuterol 108 (90 Base) MCG/ACT inhaler   ascorbic acid 500 MG tablet Commonly known as:  VITAMIN C Take 1  tablet (500 mg total) by mouth daily. Start taking on:  01/04/2018   atorvastatin 80 MG tablet Commonly known as:  LIPITOR Take 1 tablet (80 mg total) by mouth at bedtime.   clobetasol cream 0.05 % Commonly known as:  TEMOVATE Apply 1 application topically 2 (two) times daily as needed (rash).   dextromethorphan-guaiFENesin 30-600 MG 12hr tablet Commonly known as:  MUCINEX DM Take 1 tablet by mouth 2 (two) times daily as needed for cough. What changed:    when to take this  reasons to take this   enoxaparin 40 MG/0.4ML injection Commonly known as:  LOVENOX Inject 0.4 mLs (40 mg total) into the skin daily. Start taking on:  01/04/2018   ferrous sulfate 325 (65 FE) MG tablet Take 1 tablet (325 mg total) by mouth daily with breakfast. Start taking on:  01/04/2018   fluticasone furoate-vilanterol 100-25 MCG/INH Aepb Commonly known as:  BREO ELLIPTA Inhale 1 puff into the lungs daily. Start taking on:  01/04/2018   folic acid 1 MG tablet Commonly known as:  FOLVITE Take 1 tablet (1 mg total) by mouth daily. Start taking on:  01/04/2018 What changed:    medication strength  how much to take   hydrocortisone cream 1 % Apply 1 application  topically 3 (three) times daily as needed for itching.   loperamide 2 MG capsule Commonly known as:  IMODIUM Take 1 capsule (2 mg total) by mouth as needed for diarrhea or loose stools.   loteprednol 0.5 % ophthalmic suspension Commonly known as:  LOTEMAX Place 1 drop into both eyes every morning. Start taking on:  01/04/2018 Replaces:  LOTEMAX 0.5 % Gel   Melatonin 3 MG Tabs Take 3 tablets (9 mg total) by mouth at bedtime as needed (Sleep). Replaces:  Melatonin 1 MG Caps   multivitamin Tabs tablet Take 1 tablet by mouth daily. Start taking on:  01/04/2018 Replaces:  VITEYES AREDS ADVANCED PO   nitroGLYCERIN 0.4 MG SL tablet Commonly known as:  NITROSTAT Place 1 tablet (0.4 mg total) under the tongue every 5 (five) minutes as  needed for chest pain (up to 3 doses for severe chest pain. If no relief after 3rd dose, proceed to the ED for an evaluation).   ondansetron 4 MG/2ML Soln injection Commonly known as:  ZOFRAN Inject 2 mLs (4 mg total) into the vein every 8 (eight) hours as needed for nausea or vomiting.   OVER THE COUNTER MEDICATION Apply topically as needed. T-Gel Shampoo   oxyCODONE-acetaminophen 7.5-325 MG tablet Commonly known as:  PERCOCET Take 1 tablet by mouth every 4 (four) hours as needed for moderate pain. What changed:  when to take this   OXYGEN Inhale 3 L into the lungs continuous.   pantoprazole 40 MG tablet Commonly known as:  PROTONIX Take 1 tablet (40 mg total) by mouth daily. Start taking on:  01/04/2018 What changed:  when to take this   predniSONE 20 MG tablet Commonly known as:  DELTASONE Take 15 mg by mouth daily with breakfast.   SUPER B COMPLEX PO Take 1 tablet by mouth daily.   umeclidinium bromide 62.5 MCG/INH Aepb Commonly known as:  INCRUSE ELLIPTA Inhale 1 puff into the lungs daily. Start taking on:  01/04/2018       Day of Discharge BP (!) 109/58 (BP Location: Left Arm)   Pulse 83   Temp 97.9 F (36.6 C) (Oral)   Resp 20   Ht 6\' 2"  (1.88 m)   Wt 96.6 kg (213 lb)   SpO2 100%   BMI 27.35 kg/m   Physical Exam: General: No acute respiratory distress Lungs: Clear to auscultation bilaterally without wheezes or crackles Cardiovascular: Regular rate and rhythm without murmur gallop or rub normal S1 and S2 Abdomen: Nontender, nondistended, soft, bowel sounds positive, no rebound, no ascites, no appreciable mass Extremities: No significant cyanosis, clubbing, or edema bilateral lower extremities  Basic Metabolic Panel: Recent Labs  Lab 12/30/17 0715 12/31/17 0348 12/31/17 1542 01/01/18 0401 01/02/18 0657 01/03/18 0429  NA 143 143  --  142 139 138  K 2.9* 2.9* 4.1 3.3* 3.6 3.5  CL 111 110  --  105 102 103  CO2 24 25  --  29 27 28   GLUCOSE 111*  112*  --  110* 104* 106*  BUN 8 5*  --  7 8 11   CREATININE 0.78 0.56*  --  0.52* 0.52* 0.56*  CALCIUM 8.4* 8.1*  --  8.5* 8.7* 8.7*  MG 1.6* 1.9  --   --   --  1.5*  PHOS 2.4*  --   --   --   --   --     Liver Function Tests: Recent Labs  Lab 12/30/17 0715 12/31/17 0348 01/03/18 0429  AST 25 21  21  ALT 22 19 19   ALKPHOS 53 50 54  BILITOT 0.9 0.7 0.9  PROT 5.5* 5.1* 6.0*  ALBUMIN 2.5* 2.4* 2.6*    CBC: Recent Labs  Lab 12/30/17 0715 12/31/17 0348 01/01/18 0401 01/02/18 0657 01/03/18 0429  WBC 2.5* 2.7* 2.9* 2.9* 2.7*  NEUTROABS 0.6* 0.6* 0.6* 0.8* 0.5*  HGB 10.9* 9.7* 10.5* 10.8* 11.1*  HCT 33.0* 29.6* 32.0* 33.5* 33.3*  MCV 86.6 85.1 86.5 85.7 85.8  PLT 181 174 193 231 241    CBG: Recent Labs  Lab 12/30/17 0801 01/01/18 2141 01/02/18 0810 01/02/18 1207 01/02/18 1748  GLUCAP 112* 102* 95 168* 153*    Recent Results (from the past 240 hour(s))  Blood Culture (routine x 2)     Status: None   Collection Time: 12/27/17  5:21 PM  Result Value Ref Range Status   Specimen Description BLOOD LEFT FOREARM  Final   Special Requests   Final    BOTTLES DRAWN AEROBIC AND ANAEROBIC Blood Culture adequate volume   Culture   Final    NO GROWTH 5 DAYS Performed at [facility], [address] [place], [place]    Report Status 01/01/2018 FINAL  Final  Blood Culture (routine x 2)     Status: None   Collection Time: 12/27/17  5:22 PM  Result Value Ref Range Status   Specimen Description BLOOD RIGHT WRIST  Final   Special Requests   Final    BOTTLES DRAWN AEROBIC AND ANAEROBIC Blood Culture adequate volume   Culture   Final    NO GROWTH 5 DAYS Performed at [facility], [address], [place], [place]    Report Status 01/01/2018 FINAL  Final  C difficile quick scan w PCR reflex     Status: None   Collection Time: 12/27/17  8:10 PM  Result Value Ref Range Status   C Diff antigen NEGATIVE NEGATIVE Final   C Diff toxin NEGATIVE  NEGATIVE Final   C Diff interpretation No C. difficile detected.  Final    Comment: Performed at [facility], [address] [place], [place]  Gastrointestinal Panel by PCR , Stool     Status: None   Collection Time: 12/27/17  8:10 PM  Result Value Ref Range Status   Campylobacter species NOT DETECTED NOT DETECTED Final   Plesimonas shigelloides NOT DETECTED NOT DETECTED Final   Salmonella species NOT DETECTED NOT DETECTED Final   Yersinia enterocolitica NOT DETECTED NOT DETECTED Final   Vibrio species NOT DETECTED NOT DETECTED Final   Vibrio cholerae NOT DETECTED NOT DETECTED Final   Enteroaggregative E coli (EAEC) NOT DETECTED NOT DETECTED Final   Enteropathogenic E coli (EPEC) NOT DETECTED NOT DETECTED Final   Enterotoxigenic E coli (ETEC) NOT DETECTED NOT DETECTED Final   Shiga like toxin producing E coli (STEC) NOT DETECTED NOT DETECTED Final   Shigella/Enteroinvasive E coli (EIEC) NOT DETECTED NOT DETECTED Final   Cryptosporidium NOT DETECTED NOT DETECTED Final   Cyclospora cayetanensis NOT DETECTED NOT DETECTED Final   Entamoeba histolytica NOT DETECTED NOT DETECTED Final   Giardia lamblia NOT DETECTED NOT DETECTED Final   Adenovirus F40/41 NOT DETECTED NOT DETECTED Final   Astrovirus NOT DETECTED NOT DETECTED Final   Norovirus GI/GII NOT DETECTED NOT DETECTED Final   Rotavirus A NOT DETECTED NOT DETECTED Final   Sapovirus (I, II, IV, and V) NOT DETECTED NOT DETECTED Final    Comment: Performed at  [facility], [address] [place], [place] [address]  MRSA PCR Screening     Status: None   Collection Time: 12/28/17  5:27 PM  Result Value Ref Range Status   MRSA by PCR NEGATIVE NEGATIVE Final    Comment:        The GeneXpert MRSA Assay (FDA approved for NASAL specimens only), is one component of a comprehensive MRSA colonization surveillance program. It is not intended to diagnose MRSA infection nor to guide or monitor treatment for MRSA  infections. Performed at [facility], [address] [place], [place] [address]   Culture, expectorated sputum-assessment     Status: None   Collection Time: 12/31/17  6:53 PM  Result Value Ref Range Status   Specimen Description EXPECTORATED SPUTUM  Final   Special Requests   Final    NONE Performed at [facility], [address] [place], [place] [address]    Sputum evaluation   Final    Sputum specimen not acceptable for testing.  Please recollect.   Results Called to: J. THACKER, RN AT 2327 ON 12/31/17 BY C. JESSUP, MLT.    Report Status 12/31/2017 FINAL  Final  Acid Fast Smear (AFB)     Status: None   Collection Time: 12/31/17  6:53 PM  Result Value Ref Range Status   AFB Specimen Processing Concentration  Final   Acid Fast Smear Negative  Final    Comment: (NOTE) Performed At: [facility] [address] [place], [place] [id number] Jolene Schimke MD [id number]    Source (AFB) SPUTUM  Final    Comment: Performed at [facility], [address] [place], [place] [address]     Time spent in discharge (includes decision making & examination of pt): 35 minutes  01/03/2018, 6:47 PM   Lonia Blood, MD [facility] Office  [phone] Pager [phone]  On-Call/Text Page:      [email or web address]      password [facility]

## 2018-01-04 DIAGNOSIS — D709 Neutropenia, unspecified: Secondary | ICD-10-CM

## 2018-01-04 LAB — CBC WITH DIFFERENTIAL/PLATELET
BASOS PCT: 0 %
Basophils Absolute: 0 10*3/uL (ref 0.0–0.1)
Eosinophils Absolute: 0 10*3/uL (ref 0.0–0.7)
Eosinophils Relative: 1 %
HCT: 33.9 % — ABNORMAL LOW (ref 39.0–52.0)
Hemoglobin: 11 g/dL — ABNORMAL LOW (ref 13.0–17.0)
LYMPHS ABS: 1.7 10*3/uL (ref 0.7–4.0)
Lymphocytes Relative: 59 %
MCH: 27.6 pg (ref 26.0–34.0)
MCHC: 32.4 g/dL (ref 30.0–36.0)
MCV: 85 fL (ref 78.0–100.0)
MONO ABS: 0.5 10*3/uL (ref 0.1–1.0)
Monocytes Relative: 17 %
NEUTROS PCT: 23 %
Neutro Abs: 0.6 10*3/uL — ABNORMAL LOW (ref 1.7–7.7)
PLATELETS: 271 10*3/uL (ref 150–400)
RBC: 3.99 MIL/uL — ABNORMAL LOW (ref 4.22–5.81)
RDW: 16.1 % — AB (ref 11.5–15.5)
WBC: 2.8 10*3/uL — AB (ref 4.0–10.5)

## 2018-01-04 LAB — MAGNESIUM: Magnesium: 2.1 mg/dL (ref 1.7–2.4)

## 2018-01-04 MED ORDER — LIDOCAINE 5 % EX PTCH
1.0000 | MEDICATED_PATCH | CUTANEOUS | Status: DC
  Administered 2018-01-04 – 2018-01-08 (×5): 1 via TRANSDERMAL
  Filled 2018-01-04 (×6): qty 1

## 2018-01-04 NOTE — Progress Notes (Signed)
[employer] TEAM 1 - Stepdown/ICU TEAM  Joseph [name]  SPQ:[id number] DOB: [birth year] DOA: 12/27/2017 PCP: Lennart Pall, MD    Brief Narrative:  67 y.o. male with a hx of hypertension, hyperlipidemia, COPD, chronic respiratory failure on 3 L [place], GERD, CAD, gastroparesis, psoriasis, rheumatoid arthritis, pancytopenia, and diastolic CHF who presented with diarrhea and fever.  He was hospitalized 4/29-5/3 due to [facility] discharged home on Augmentin.  He developed severe diarrhea the following day w/ more than 20 watery bowel movements and a fever of 102.   ED w/u revealed WBC 4.5, lactic acid 1.58, negative urinalysis, negative C. difficile antigen toxin, temperature 102.8, hypotensive with blood pressure 87/59, tachypnea, oxygen saturation 100% on 3 L nasal cannula oxygen.  Chest x-ray showed known left upper lobe thick-walled cavitary lesion with increased size and increased surrounding airspace consolidation.    Subjective: Resting comfortably.  No new complaints.  Awaiting transfer to [facility].     Assessment & Plan:  SIRS  SIRS physiology has resolved w/ simple volume expansion   Diarrhea C. 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 (does not have splenomegaly but this does not rule it out) - has been accepted for transfer to [facility] but no beds are available - no fever or infectious sx at this time   Hypokalemia Corrected   Hypomagnesemia  Corrected   Type 2 diabetes, controlled, with neuropathy A1c6.1 08/15/17 - does not require use of diabetes medicines as an outpatient - CBG  controlled  HTN BP remains stable w/o medical tx at this time   HLD Continue home medical therapy   CAD Presently asymptomatic - continue home medical therapy  GERD Cont lower dose of PPI   Rheumatoid arthritis Continue home dose of prednisone with no indication for stress dosing at this time  Chronic diastolic heart failure TTE 08/15/2017 noted EF 55-60% with grade 1 diastolic dysfunction -holding diuretic due to significant dehydration in face of diarrhea   Filed Weights   12/27/17 1725  Weight: 96.6 kg (213 lb)     DVT prophylaxis: Lovenox Code Status: FULL CODE Family Communication: spoke w/ wife at bedside   Disposition Plan: med surg   Consultants:  None  Antimicrobials:  None  Objective: Blood pressure 110/66, pulse 63, temperature 97.9 F (36.6 C), temperature source Oral, resp. rate 18, height 6\' 2"  (1.88 m), weight 96.6 kg (213 lb), SpO2 96 %.  Intake/Output Summary (Last 24 hours) at 01/04/2018 1848 Last data filed at 01/04/2018 1815 Gross per 24 hour  Intake -  Output 1600 ml  Net -1600 ml   Filed Weights   12/27/17 1725  Weight: 96.6 kg (213 lb)    Examination: General: No acute respiratory distress Lungs: Clear to auscultation bilaterally  Cardiovascular: RRR  Abdomen: Nontender, overweight, soft, bowel sounds positive Extremities: No edema B LE   CBC: Recent Labs  Lab 01/02/18 0657 01/03/18 0429 01/04/18 0344  WBC 2.9* 2.7* 2.8*  NEUTROABS 0.8* 0.5* 0.6*  HGB 10.8* 11.1* 11.0*  HCT 33.5* 33.3* 33.9*  MCV 85.7 85.8 85.0  PLT 231 241 271   Basic Metabolic Panel: Recent Labs  Lab 12/30/17 0715 12/31/17 0348  01/01/18 0401 01/02/18 0657 01/03/18 0429 01/04/18 0344  NA 143 143  --  142 139 138  --   K 2.9* 2.9*   < > 3.3* 3.6 3.5  --   CL 111 110  --  105 102 103  --   CO2 24 25  --  29 27 28   --   GLUCOSE 111* 112*  --  110* 104* 106*  --   BUN 8 5*  --  7 8 11   --   CREATININE 0.78 0.56*  --  0.52* 0.52* 0.56*  --    CALCIUM 8.4* 8.1*  --  8.5* 8.7* 8.7*  --   MG 1.6* 1.9  --   --   --  1.5* 2.1  PHOS 2.4*  --   --   --   --   --   --    < > = values in this interval not displayed.   GFR: Estimated Creatinine Clearance: 107 mL/min (A) (by C-G formula based on SCr of 0.56 mg/dL (L)).  Liver Function Tests: Recent Labs  Lab 12/30/17 0715 12/31/17 0348 01/03/18 0429  AST 25 21 21   ALT 22 19 19   ALKPHOS 53 50 54  BILITOT 0.9 0.7 0.9  PROT 5.5* 5.1* 6.0*  ALBUMIN 2.5* 2.4* 2.6*    HbA1C: Hgb A1c MFr Bld  Date/Time Value Ref Range Status  08/15/2017 05:17 AM 6.1 (H) 4.8 - 5.6 % Final    Comment:    (NOTE) Pre diabetes:          5.7%-6.4% Diabetes:              >6.4% Glycemic control for   <7.0% adults with diabetes   06/24/2017 10:42 AM 6.5 4.6 - 6.5 % Final    Comment:    Glycemic Control Guidelines for People with Diabetes:Non Diabetic:  <6%Goal of Therapy: <7%Additional Action Suggested:  >8%     CBG: Recent Labs  Lab 12/30/17 0801 01/01/18 2141 01/02/18 0810 01/02/18 1207 01/02/18 1748  GLUCAP 112* 102* 95 168* 153*    Recent Results (from the past 240 hour(s))  Blood Culture (routine x 2)     Status: None   Collection Time: 12/27/17  5:21 PM  Result Value Ref Range Status   Specimen Description BLOOD LEFT FOREARM  Final   Special Requests   Final    BOTTLES DRAWN AEROBIC AND ANAEROBIC Blood Culture adequate volume   Culture   Final    NO GROWTH 5 DAYS Performed at [facility], [address], [place], 02/26/18 [facility]    Report Status 01/01/2018 FINAL  Final  Blood Culture (routine x 2)     Status: None   Collection Time: 12/27/17  5:22 PM  Result Value Ref Range Status   Specimen Description BLOOD RIGHT WRIST  Final   Special Requests   Final    BOTTLES DRAWN AEROBIC AND ANAEROBIC Blood Culture adequate volume   Culture   Final    NO GROWTH 5 DAYS Performed at [facility], [address], [place], 02/26/18 [facility]    Report Status  01/01/2018 FINAL  Final  C difficile quick scan w PCR reflex     Status: None   Collection Time: 12/27/17  8:10 PM  Result Value Ref Range Status   C Diff antigen NEGATIVE NEGATIVE Final   C Diff toxin NEGATIVE NEGATIVE Final   C Diff interpretation No C.  difficile detected.  Final    Comment: Performed at [facility], [address], [place], [place] [address]  Gastrointestinal Panel by PCR , Stool     Status: None   Collection Time: 12/27/17  8:10 PM  Result Value Ref Range Status   Campylobacter species NOT DETECTED NOT DETECTED Final   Plesimonas shigelloides NOT DETECTED NOT DETECTED Final   Salmonella species NOT DETECTED NOT DETECTED Final   Yersinia enterocolitica NOT DETECTED NOT DETECTED Final   Vibrio species NOT DETECTED NOT DETECTED Final   Vibrio cholerae NOT DETECTED NOT DETECTED Final   Enteroaggregative E coli (EAEC) NOT DETECTED NOT DETECTED Final   Enteropathogenic E coli (EPEC) NOT DETECTED NOT DETECTED Final   Enterotoxigenic E coli (ETEC) NOT DETECTED NOT DETECTED Final   Shiga like toxin producing E coli (STEC) NOT DETECTED NOT DETECTED Final   Shigella/Enteroinvasive E coli (EIEC) NOT DETECTED NOT DETECTED Final   Cryptosporidium NOT DETECTED NOT DETECTED Final   Cyclospora cayetanensis NOT DETECTED NOT DETECTED Final   Entamoeba histolytica NOT DETECTED NOT DETECTED Final   Giardia lamblia NOT DETECTED NOT DETECTED Final   Adenovirus F40/41 NOT DETECTED NOT DETECTED Final   Astrovirus NOT DETECTED NOT DETECTED Final   Norovirus GI/GII NOT DETECTED NOT DETECTED Final   Rotavirus A NOT DETECTED NOT DETECTED Final   Sapovirus (I, II, IV, and V) NOT DETECTED NOT DETECTED Final    Comment: Performed at [facility], [address] [place], [place] [address]  MRSA PCR Screening     Status: None   Collection Time: 12/28/17  5:27 PM  Result Value Ref Range Status   MRSA by PCR NEGATIVE NEGATIVE Final    Comment:        The GeneXpert MRSA Assay  (FDA approved for NASAL specimens only), is one component of a comprehensive MRSA colonization surveillance program. It is not intended to diagnose MRSA infection nor to guide or monitor treatment for MRSA infections. Performed at [facility], [address] [place], [place] [address]   Culture, expectorated sputum-assessment     Status: None   Collection Time: 12/31/17  6:53 PM  Result Value Ref Range Status   Specimen Description EXPECTORATED SPUTUM  Final   Special Requests   Final    NONE Performed at [facility], [address] [place], [place] [address]    Sputum evaluation   Final    Sputum specimen not acceptable for testing.  Please recollect.   Results Called to: J. THACKER, RN AT 2327 ON 12/31/17 BY C. JESSUP, MLT.    Report Status 12/31/2017 FINAL  Final  Fungus Culture With Stain     Status: None (Preliminary result)   Collection Time: 12/31/17  6:53 PM  Result Value Ref Range Status   Fungus Stain Final report  Final    Comment: (NOTE) Performed At: [facility] [address] [place], [place] [id number] Jolene Schimke MD [id number]    Fungus (Mycology) Culture PENDING  Incomplete   Fungal Source SPUTUM  Final    Comment: Performed at [facility], [address] [place], [place] [address]  Acid Fast Smear (AFB)     Status: None   Collection Time: 12/31/17  6:53 PM  Result Value Ref Range Status   AFB Specimen Processing Concentration  Final   Acid Fast Smear Negative  Final    Comment: (NOTE) Performed At: [facility] [address] [place], [place] [id number]  Jolene Schimke MD [id number]    Source (AFB) SPUTUM  Final    Comment: Performed at [facility], [address] [place], [place] [address]  Fungus Culture Result     Status: None   Collection Time: 12/31/17  6:53 PM  Result Value Ref Range Status   Result 1 Yeast observed  Final    Comment: (NOTE) Performed At: [facility] [address] [place], [place] [id number] Jolene Schimke MD [id number] Performed at [facility], [address] [place], [place] [address]      Scheduled Meds: . aspirin EC  81 mg Oral Daily  . atorvastatin  80 mg Oral QHS  . enoxaparin (LOVENOX) injection  40 mg Subcutaneous Q24H  . ferrous sulfate  325 mg Oral Q breakfast  . fluticasone furoate-vilanterol  1 puff Inhalation Daily  . folic acid  1,000 mcg Oral Daily  . loteprednol  1 drop Both Eyes q morning - 10a  . mouth rinse  15 mL Mouth Rinse BID  . multivitamin  1 tablet Oral Daily  . pantoprazole  40 mg Oral Daily  . predniSONE  15 mg Oral Q breakfast  . pregabalin  75 mg Oral BID  . umeclidinium bromide  1 puff Inhalation Daily  . vitamin C  500 mg Oral Daily     LOS: 7 days   Lonia Blood, MD [facility] Office  [phone] Pager - Text Page per Amion as per below:  On-Call/Text Page:      [email or web address]      password [email or web address]  If 7PM-7AM, please contact night-coverage [email or web address] Password [facility] 01/04/2018, 6:48 PM

## 2018-01-05 LAB — COMPREHENSIVE METABOLIC PANEL
ALBUMIN: 2.7 g/dL — AB (ref 3.5–5.0)
ALT: 18 U/L (ref 17–63)
AST: 22 U/L (ref 15–41)
Alkaline Phosphatase: 57 U/L (ref 38–126)
Anion gap: 7 (ref 5–15)
BUN: 15 mg/dL (ref 6–20)
CHLORIDE: 103 mmol/L (ref 101–111)
CO2: 28 mmol/L (ref 22–32)
CREATININE: 0.58 mg/dL — AB (ref 0.61–1.24)
Calcium: 8.8 mg/dL — ABNORMAL LOW (ref 8.9–10.3)
GFR calc non Af Amer: >60 mL/min (ref >60)
GLUCOSE: 114 mg/dL — AB (ref 65–99)
Potassium: 3.9 mmol/L (ref 3.5–5.1)
SODIUM: 138 mmol/L (ref 135–145)
Total Bilirubin: 0.7 mg/dL (ref 0.3–1.2)
Total Protein: 6.1 g/dL — ABNORMAL LOW (ref 6.5–8.1)

## 2018-01-05 LAB — CBC WITH DIFFERENTIAL/PLATELET
BASOS ABS: 0 10*3/uL (ref 0.0–0.1)
BASOS PCT: 0 %
EOS ABS: 0 10*3/uL (ref 0.0–0.7)
Eosinophils Relative: 1 %
HCT: 33.9 % — ABNORMAL LOW (ref 39.0–52.0)
HEMOGLOBIN: 10.9 g/dL — AB (ref 13.0–17.0)
LYMPHS PCT: 57 %
Lymphs Abs: 2 10*3/uL (ref 0.7–4.0)
MCH: 27.5 pg (ref 26.0–34.0)
MCHC: 32.2 g/dL (ref 30.0–36.0)
MCV: 85.6 fL (ref 78.0–100.0)
MONO ABS: 0.7 10*3/uL (ref 0.1–1.0)
Monocytes Relative: 20 %
NEUTROS PCT: 22 %
Neutro Abs: 0.7 10*3/uL — ABNORMAL LOW (ref 1.7–7.7)
PLATELETS: 267 10*3/uL (ref 150–400)
RBC: 3.96 MIL/uL — AB (ref 4.22–5.81)
RDW: 16 % — ABNORMAL HIGH (ref 11.5–15.5)
WBC: 3.4 10*3/uL — AB (ref 4.0–10.5)

## 2018-01-05 NOTE — Progress Notes (Signed)
Occupational Therapy Treatment Patient Details Name: Joseph [name] MRN: [id number] DOB: 17-Oct-1950 Today's Date: 01/05/2018    History of present illness Pt is a 67 y/o male admitted secondary to fever and diarrhea. Pt with recent admission to [facility] for pneumonia. PMH includes DM, HTN, CAD, dCHF, COPD on 3L of oxygen at home, and RA.    OT comments  Pt making progress towards OT goals. Pt seated in recliner chair finishing breakfast upon entering the room. Pt agreeable to OT intervention and requesting B UE strengthening exercises. OT educated and demonstrated use of level 1 theraband. Pt returned demonstrations with min verbal cues for proper technique. Pt performed 2 sets of 10 chest pulls, alternating punches, shoulder diagonals, shoulder flexion, and bicep curls. Pt taking rest breaks as needed. Pt continues to benefit from OT intervention.   Follow Up Recommendations  Home health OT;Supervision/Assistance - 24 hour    Equipment Recommendations  Other (comment)(defer to next venue of care. May transfer to [facility] hospital)    Recommendations for Other Services      Precautions / Restrictions Precautions Precautions: Fall Restrictions Weight Bearing Restrictions: No       Mobility    Balance Overall balance assessment: Needs assistance Sitting-balance support: No upper extremity supported Sitting balance-Leahy Scale: Good           ADL either performed or assessed with clinical judgement                  Cognition Arousal/Alertness: Awake/alert Behavior During Therapy: WFL for tasks assessed/performed Overall Cognitive Status: Within Functional Limits for tasks assessed                         Pertinent Vitals/ Pain       Pain Assessment: 0-10 Pain Score: 4  Pain Location: SI joint pain Pain Descriptors / Indicators: Aching Pain Intervention(s): Monitored during session;Limited activity within patient's tolerance         Frequency  Min 2X/week         Progress Toward Goals  OT Goals(current goals can now be found in the care plan section)  Progress towards OT goals: Progressing toward goals  Acute Rehab OT Goals Patient Stated Goal: to go home  OT Goal Formulation: With patient Time For Goal Achievement: 01/19/18 Potential to Achieve Goals: Good  Plan Discharge plan remains appropriate       AM-PAC PT "6 Clicks" Daily Activity     Outcome Measure   Help from another person eating meals?: None Help from another person taking care of personal grooming?: A Little Help from another person toileting, which includes using toliet, bedpan, or urinal?: A Lot Help from another person bathing (including washing, rinsing, drying)?: A Lot Help from another person to put on and taking off regular upper body clothing?: A Little Help from another person to put on and taking off regular lower body clothing?: A Lot 6 Click Score: 16    End of Session Equipment Utilized During Treatment: Oxygen  OT Visit Diagnosis: Muscle weakness (generalized) (M62.81)   Activity Tolerance Patient tolerated treatment well   Patient Left with call bell/phone within reach;in chair;with chair alarm set   Nurse Communication          Time: 0865-7846 OT Time Calculation (min): 26 min  Charges: OT General Charges $OT Visit: 1 Visit OT Treatments $Therapeutic Exercise: 23-37 mins(26)     Evarose Altland P, MS, OTR/L 01/05/2018, 3:00 PM

## 2018-01-05 NOTE — Progress Notes (Signed)
Physical Therapy Treatment Patient Details Name: Joseph [name] MRN: [id number] DOB: 07/01/1951 Today's Date: 01/05/2018    History of Present Illness Pt is a 67 y/o male admitted secondary to fever and diarrhea. Pt with recent admission to [facility] for pneumonia. PMH includes DM, HTN, CAD, dCHF, COPD on 3L of oxygen at home, and RA.     PT Comments    Patient seen for activity progression. Patient was able to tolerate increased distance with ambulation today. Heavy reliance on UE support bilaterally. Current POC remains appropriate. Will continue to see and progress as tolerated.   Follow Up Recommendations  Supervision/Assistance - 24 hour;Home health PT     Equipment Recommendations  Wheelchair (measurements PT);Wheelchair cushion (measurements PT)(lightweight transport WC for longer distance mobility )    Recommendations for Other Services OT consult     Precautions / Restrictions Precautions Precautions: Fall Restrictions Weight Bearing Restrictions: No    Mobility  Bed Mobility Overal bed mobility: Needs Assistance Bed Mobility: Supine to Sit     Supine to sit: Min assist     General bed mobility comments: min assist to elevate trunk to upright  Transfers Overall transfer level: Needs assistance Equipment used: Bilateral platform walker Transfers: Sit to/from Stand Sit to Stand: Min guard;From elevated surface         General transfer comment: Demonstrated safe hand placement.   Ambulation/Gait Ambulation/Gait assistance: Min guard Ambulation Distance (Feet): 300 Feet Assistive device: Bilateral platform walker Gait Pattern/deviations: Step-through pattern;Trunk flexed;Decreased stride length Gait velocity: Decreased  Gait velocity interpretation: <1.31 ft/sec, indicative of household ambulator General Gait Details: one standing rest break, flexed posture with slow guarded gait. Heavy relinace on UE support. Patient does endorse improvements in back pain with  mobility   Stairs             Wheelchair Mobility    Modified Rankin (Stroke Patients Only)       Balance Overall balance assessment: Needs assistance Sitting-balance support: No upper extremity supported Sitting balance-Leahy Scale: Good     Standing balance support: Bilateral upper extremity supported;During functional activity Standing balance-Leahy Scale: Poor Standing balance comment: Reliant on BUE support.                             Cognition Arousal/Alertness: Awake/alert Behavior During Therapy: WFL for tasks assessed/performed Overall Cognitive Status: Within Functional Limits for tasks assessed                                        Exercises      General Comments        Pertinent Vitals/Pain Pain Assessment: Faces Faces Pain Scale: Hurts even more Pain Location: SI joint pain Pain Descriptors / Indicators: Aching Pain Intervention(s): Monitored during session;Repositioned    Home Living                      Prior Function            PT Goals (current goals can now be found in the care plan section) Acute Rehab PT Goals Patient Stated Goal: to go home  PT Goal Formulation: With patient/family Time For Goal Achievement: 01/14/18 Potential to Achieve Goals: Good Progress towards PT goals: Progressing toward goals    Frequency    Min 3X/week      PT Plan Current  plan remains appropriate    Co-evaluation              AM-PAC PT "6 Clicks" Daily Activity  Outcome Measure  Difficulty turning over in bed (including adjusting bedclothes, sheets and blankets)?: None Difficulty moving from lying on back to sitting on the side of the bed? : Unable Difficulty sitting down on and standing up from a chair with arms (e.g., wheelchair, bedside commode, etc,.)?: A Little Help needed moving to and from a bed to chair (including a wheelchair)?: A Little Help needed walking in hospital room?: A  Little Help needed climbing 3-5 steps with a railing? : A Lot 6 Click Score: 16    End of Session Equipment Utilized During Treatment: Gait belt Activity Tolerance: Patient tolerated treatment well Patient left: in chair;with call bell/phone within reach;with family/visitor present Nurse Communication: Mobility status PT Visit Diagnosis: Unsteadiness on feet (R26.81);Other abnormalities of gait and mobility (R26.89);Muscle weakness (generalized) (M62.81)     Time: 9390-3009 PT Time Calculation (min) (ACUTE ONLY): 21 min  Charges:  $Gait Training: 8-22 mins                    G Codes:       Charlotte Crumb, PT DPT  Board Certified Neurologic Specialist [phone]    Fabio Asa 01/05/2018, 11:12 AM

## 2018-01-05 NOTE — Progress Notes (Signed)
[facility] TEAM 1 - Stepdown/ICU TEAM  Joseph [name]  LKG:[id number] DOB: 1950-12-15 DOA: 12/27/2017 PCP: Lennart Pall, MD    Brief Narrative:  67 y.o. male with a hx of hypertension, hyperlipidemia, COPD, chronic respiratory failure on 3 L [place], GERD, CAD, gastroparesis, psoriasis, rheumatoid arthritis, pancytopenia, and diastolic CHF who presented with diarrhea and fever.  He was hospitalized 4/29-5/3 due to [facility] discharged home on Augmentin.  He developed severe diarrhea the following day w/ more than 20 watery bowel movements and a fever of 102.   ED w/u revealed WBC 4.5, lactic acid 1.58, negative urinalysis, negative C. difficile antigen toxin, temperature 102.8, hypotensive with blood pressure 87/59, tachypnea, oxygen saturation 100% on 3 L nasal cannula oxygen.  Chest x-ray showed known left upper lobe thick-walled cavitary lesion with increased size and increased surrounding airspace consolidation.    Subjective: No new issues.  Still awaiting bed at [facility] for more extensive w/u and possible tx for persisting neutropenia.    Remains afebrile w/o sx of acute infection.     Assessment & Plan:  SIRS  SIRS physiology resolved w/ simple volume expansion   Diarrhea C. 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 (does not have splenomegaly but this does not rule it out) - has been accepted for transfer to [facility] but no beds are available - no fever or infectious sx at this time   Hypokalemia Corrected   Hypomagnesemia  Corrected   Type 2 diabetes,  controlled, with neuropathy A1c6.1 08/15/17 - does not require use of diabetes medicines as an outpatient - CBG controlled  HTN BP remains stable w/o medical tx at this time   HLD Continue home medical therapy   CAD Presently asymptomatic - continue home medical therapy  GERD Cont lower dose of PPI   Rheumatoid arthritis Continue home dose of prednisone with no indication for stress dosing at this time  Chronic diastolic heart failure TTE 08/15/2017 noted EF 55-60% with grade 1 diastolic dysfunction -holding diuretic due to significant dehydration in face of diarrhea   Filed Weights   12/27/17 1725  Weight: 96.6 kg (213 lb)     DVT prophylaxis: Lovenox Code Status: FULL CODE Family Communication: Disposition Plan: med surg - simply awaiting transfer to [facility]   Consultants:  PCCM  Antimicrobials:  None  Objective: Blood pressure (!) 108/53, pulse (!) 56, temperature 98.1 F (36.7 C), temperature source Oral, resp. rate 17, height 6\' 2"  (1.88 m), weight 96.6 kg (213 lb), SpO2 98 %.  Intake/Output Summary (Last 24 hours) at 01/05/2018 1722 Last data filed at 01/05/2018 1453 Gross per 24 hour  Intake 666 ml  Output 1380 ml  Net -714 ml   Filed Weights   12/27/17 1725  Weight: 96.6 kg (213 lb)    Examination: General: No acute respiratory distress Lungs: CTA B  Cardiovascular: RRR - no M  Abdomen: NT/ND, soft, bs+ Extremities: No edema B LE   CBC: Recent Labs  Lab 01/03/18 0429 01/04/18 0344 01/05/18 0702  WBC 2.7* 2.8* 3.4*  NEUTROABS 0.5* 0.6* 0.7*  HGB 11.1* 11.0* 10.9*  HCT  33.3* 33.9* 33.9*  MCV 85.8 85.0 85.6  PLT 241 271 267   Basic Metabolic Panel: Recent Labs  Lab 12/30/17 0715 12/31/17 0348  01/02/18 0657 01/03/18 0429 01/04/18 0344 01/05/18 0702  NA 143 143   < > 139 138  --  138  K 2.9* 2.9*   < > 3.6 3.5  --  3.9  CL 111 110   < > 102 103  --  103  CO2 24 25   < > 27 28  --  28  GLUCOSE 111* 112*   < > 104* 106*  --   114*  BUN 8 5*   < > 8 11  --  15  CREATININE 0.78 0.56*   < > 0.52* 0.56*  --  0.58*  CALCIUM 8.4* 8.1*   < > 8.7* 8.7*  --  8.8*  MG 1.6* 1.9  --   --  1.5* 2.1  --   PHOS 2.4*  --   --   --   --   --   --    < > = values in this interval not displayed.   GFR: Estimated Creatinine Clearance: 107 mL/min (A) (by C-G formula based on SCr of 0.58 mg/dL (L)).  Liver Function Tests: Recent Labs  Lab 12/30/17 0715 12/31/17 0348 01/03/18 0429 01/05/18 0702  AST 25 21 21 22   ALT 22 19 19 18   ALKPHOS 53 50 54 57  BILITOT 0.9 0.7 0.9 0.7  PROT 5.5* 5.1* 6.0* 6.1*  ALBUMIN 2.5* 2.4* 2.6* 2.7*    HbA1C: Hgb A1c MFr Bld  Date/Time Value Ref Range Status  08/15/2017 05:17 AM 6.1 (H) 4.8 - 5.6 % Final    Comment:    (NOTE) Pre diabetes:          5.7%-6.4% Diabetes:              >6.4% Glycemic control for   <7.0% adults with diabetes   06/24/2017 10:42 AM 6.5 4.6 - 6.5 % Final    Comment:    Glycemic Control Guidelines for People with Diabetes:Non Diabetic:  <6%Goal of Therapy: <7%Additional Action Suggested:  >8%     CBG: Recent Labs  Lab 12/30/17 0801 01/01/18 2141 01/02/18 0810 01/02/18 1207 01/02/18 1748  GLUCAP 112* 102* 95 168* 153*    Recent Results (from the past 240 hour(s))  Blood Culture (routine x 2)     Status: None   Collection Time: 12/27/17  5:21 PM  Result Value Ref Range Status   Specimen Description BLOOD LEFT FOREARM  Final   Special Requests   Final    BOTTLES DRAWN AEROBIC AND ANAEROBIC Blood Culture adequate volume   Culture   Final    NO GROWTH 5 DAYS Performed at [facility], [address] [place], [address] [place]    Report Status 01/01/2018 FINAL  Final  Blood Culture (routine x 2)     Status: None   Collection Time: 12/27/17  5:22 PM  Result Value Ref Range Status   Specimen Description BLOOD RIGHT WRIST  Final   Special Requests   Final    BOTTLES DRAWN AEROBIC AND ANAEROBIC Blood Culture adequate volume   Culture   Final      NO GROWTH 5 DAYS Performed at [facility], [address], [place], [address] [place]    Report Status 01/01/2018 FINAL  Final  C difficile quick scan w PCR reflex     Status: None  Collection Time: 12/27/17  8:10 PM  Result Value Ref Range Status   C Diff antigen NEGATIVE NEGATIVE Final   C Diff toxin NEGATIVE NEGATIVE Final   C Diff interpretation No C. difficile detected.  Final    Comment: Performed at [facility], [address], [place], [place] [address]  Gastrointestinal Panel by PCR , Stool     Status: None   Collection Time: 12/27/17  8:10 PM  Result Value Ref Range Status   Campylobacter species NOT DETECTED NOT DETECTED Final   Plesimonas shigelloides NOT DETECTED NOT DETECTED Final   Salmonella species NOT DETECTED NOT DETECTED Final   Yersinia enterocolitica NOT DETECTED NOT DETECTED Final   Vibrio species NOT DETECTED NOT DETECTED Final   Vibrio cholerae NOT DETECTED NOT DETECTED Final   Enteroaggregative E coli (EAEC) NOT DETECTED NOT DETECTED Final   Enteropathogenic E coli (EPEC) NOT DETECTED NOT DETECTED Final   Enterotoxigenic E coli (ETEC) NOT DETECTED NOT DETECTED Final   Shiga like toxin producing E coli (STEC) NOT DETECTED NOT DETECTED Final   Shigella/Enteroinvasive E coli (EIEC) NOT DETECTED NOT DETECTED Final   Cryptosporidium NOT DETECTED NOT DETECTED Final   Cyclospora cayetanensis NOT DETECTED NOT DETECTED Final   Entamoeba histolytica NOT DETECTED NOT DETECTED Final   Giardia lamblia NOT DETECTED NOT DETECTED Final   Adenovirus F40/41 NOT DETECTED NOT DETECTED Final   Astrovirus NOT DETECTED NOT DETECTED Final   Norovirus GI/GII NOT DETECTED NOT DETECTED Final   Rotavirus A NOT DETECTED NOT DETECTED Final   Sapovirus (I, II, IV, and V) NOT DETECTED NOT DETECTED Final    Comment: Performed at [facility], [address] [place], [place] [address]  MRSA PCR Screening     Status: None   Collection Time: 12/28/17  5:27 PM   Result Value Ref Range Status   MRSA by PCR NEGATIVE NEGATIVE Final    Comment:        The GeneXpert MRSA Assay (FDA approved for NASAL specimens only), is one component of a comprehensive MRSA colonization surveillance program. It is not intended to diagnose MRSA infection nor to guide or monitor treatment for MRSA infections. Performed at [facility], [address] [place], [place] [address]   Culture, expectorated sputum-assessment     Status: None   Collection Time: 12/31/17  6:53 PM  Result Value Ref Range Status   Specimen Description EXPECTORATED SPUTUM  Final   Special Requests   Final    NONE Performed at [facility], [address] [place], [place] [address]    Sputum evaluation   Final    Sputum specimen not acceptable for testing.  Please recollect.   Results Called to: J. THACKER, RN AT 2327 ON 12/31/17 BY C. JESSUP, MLT.    Report Status 12/31/2017 FINAL  Final  Fungus Culture With Stain     Status: None (Preliminary result)   Collection Time: 12/31/17  6:53 PM  Result Value Ref Range Status   Fungus Stain Final report  Final    Comment: (NOTE) Performed At: [facility] [address] [place], [place] [id number] Jolene Schimke MD [id number]    Fungus (Mycology) Culture PENDING  Incomplete   Fungal Source SPUTUM  Final    Comment: Performed at [facility], [address], [place], [place] [address]  Acid Fast Smear (AFB)     Status: None   Collection Time: 12/31/17  6:53 PM  Result Value  Ref Range Status   AFB Specimen Processing Concentration  Final   Acid Fast Smear Negative  Final    Comment: (NOTE) Performed At: [facility] [address] [place], [place] [id number] Jolene Schimke MD [id number]    Source (AFB) SPUTUM  Final    Comment: Performed at [facility], [address], [place], [place] [address]  Fungus Culture Result     Status: None   Collection Time: 12/31/17  6:53 PM  Result Value  Ref Range Status   Result 1 Yeast observed  Final    Comment: (NOTE) Performed At: [facility] [address] [place], [place] [id number] Jolene Schimke MD [id number] Performed at [facility], [address] [place], [place] [address]      Scheduled Meds: . aspirin EC  81 mg Oral Daily  . atorvastatin  80 mg Oral QHS  . enoxaparin (LOVENOX) injection  40 mg Subcutaneous Q24H  . ferrous sulfate  325 mg Oral Q breakfast  . fluticasone furoate-vilanterol  1 puff Inhalation Daily  . folic acid  1,000 mcg Oral Daily  . lidocaine  1 patch Transdermal Q24H  . loteprednol  1 drop Both Eyes q morning - 10a  . mouth rinse  15 mL Mouth Rinse BID  . multivitamin  1 tablet Oral Daily  . pantoprazole  40 mg Oral Daily  . predniSONE  15 mg Oral Q breakfast  . pregabalin  75 mg Oral BID  . umeclidinium bromide  1 puff Inhalation Daily  . vitamin C  500 mg Oral Daily     LOS: 8 days   Lonia Blood, MD [facility] Office  [phone] Pager - Text Page per Amion as per below:  On-Call/Text Page:      [email or web address]      password [email or web address]  If 7PM-7AM, please contact night-coverage [email or web address] Password [email or web address] 01/05/2018, 5:22 PM

## 2018-01-06 LAB — CBC WITH DIFFERENTIAL/PLATELET
Basophils Absolute: 0 10*3/uL (ref 0.0–0.1)
Basophils Relative: 0 %
EOS ABS: 0 10*3/uL (ref 0.0–0.7)
Eosinophils Relative: 1 %
HCT: 32.1 % — ABNORMAL LOW (ref 39.0–52.0)
Hemoglobin: 10.8 g/dL — ABNORMAL LOW (ref 13.0–17.0)
Lymphocytes Relative: 55 %
Lymphs Abs: 1.5 10*3/uL (ref 0.7–4.0)
MCH: 29 pg (ref 26.0–34.0)
MCHC: 33.6 g/dL (ref 30.0–36.0)
MCV: 86.1 fL (ref 78.0–100.0)
MONO ABS: 0.7 10*3/uL (ref 0.1–1.0)
Monocytes Relative: 24 %
NEUTROS PCT: 20 %
Neutro Abs: 0.6 10*3/uL — ABNORMAL LOW (ref 1.7–7.7)
PLATELETS: 261 10*3/uL (ref 150–400)
RBC: 3.73 MIL/uL — AB (ref 4.22–5.81)
RDW: 16.2 % — AB (ref 11.5–15.5)
WBC: 2.8 10*3/uL — AB (ref 4.0–10.5)

## 2018-01-06 LAB — GLUCOSE, CAPILLARY
GLUCOSE-CAPILLARY: 97 mg/dL (ref 65–99)
GLUCOSE-CAPILLARY: 176 mg/dL — AB (ref 65–99)
Glucose-Capillary: 138 mg/dL — ABNORMAL HIGH (ref 65–99)
Glucose-Capillary: 144 mg/dL — ABNORMAL HIGH (ref 65–99)

## 2018-01-06 MED ORDER — TBO-FILGRASTIM 480 MCG/0.8ML ~~LOC~~ SOSY
480.0000 ug | PREFILLED_SYRINGE | Freq: Every day | SUBCUTANEOUS | Status: DC
  Administered 2018-01-06 – 2018-01-08 (×3): 480 ug via SUBCUTANEOUS
  Filled 2018-01-06 (×6): qty 0.8

## 2018-01-06 NOTE — Progress Notes (Signed)
Physical Therapy Treatment Patient Details Name: Joseph [name] MRN: [id number] DOB: Sep 15, 1950 Today's Date: 01/06/2018    History of Present Illness Pt is a 67 y/o male admitted secondary to fever and diarrhea. Pt with recent admission to [facility] for pneumonia. PMH includes DM, HTN, CAD, dCHF, COPD on 3L of oxygen at home, and RA.     PT Comments    Patient progressing with mobility able to stand to don shorts and walk in room short distance no device.  Feels he is progressing when he can walk each day, and encourage to ask staff for assistance.  Continues to be appropriate for follow up HHPT at d/c.   Follow Up Recommendations  Supervision/Assistance - 24 hour;Home health PT     Equipment Recommendations  Wheelchair (measurements PT);Wheelchair cushion (measurements PT)(lightweight transport w/c)    Recommendations for Other Services       Precautions / Restrictions Precautions Precautions: Fall    Mobility  Bed Mobility         Supine to sit: Modified independent (Device/Increase time)     General bed mobility comments: throws legs off bed using momentum to come upright  Transfers Overall transfer level: Needs assistance Equipment used: Bilateral platform walker Transfers: Sit to/from Stand Sit to Stand: Min guard;From elevated surface         General transfer comment: requested bed to be elevated  Ambulation/Gait Ambulation/Gait assistance: Min guard Ambulation Distance (Feet): 300 Feet Assistive device: Bilateral platform walker Gait Pattern/deviations: Step-through pattern;Trunk flexed;Decreased stride length     General Gait Details: leans forward on walker due to flexion contracture in knees and back issues, but reports feeling back continues to improve   Stairs             Wheelchair Mobility    Modified Rankin (Stroke Patients Only)       Balance Overall balance assessment: Needs assistance Sitting-balance support: No upper extremity  supported Sitting balance-Leahy Scale: Good Sitting balance - Comments: seated to don shorts, assist for shoes     Standing balance-Leahy Scale: Fair Standing balance comment: pulls up shorts in standing, able to walk in room no device around bed to chair touching foot board and rail, but not reliant                            Cognition Arousal/Alertness: Awake/alert Behavior During Therapy: WFL for tasks assessed/performed Overall Cognitive Status: Within Functional Limits for tasks assessed                                        Exercises      General Comments General comments (skin integrity, edema, etc.): on 3L O2 throughout, SpO2 98% after ambulation      Pertinent Vitals/Pain Faces Pain Scale: Hurts little more Pain Location: SI joint pain Pain Descriptors / Indicators: Aching Pain Intervention(s): Monitored during session;Repositioned    Home Living                      Prior Function            PT Goals (current goals can now be found in the care plan section) Progress towards PT goals: Progressing toward goals    Frequency    Min 3X/week      PT Plan Current plan remains appropriate    Co-evaluation  AM-PAC PT "6 Clicks" Daily Activity  Outcome Measure  Difficulty turning over in bed (including adjusting bedclothes, sheets and blankets)?: None Difficulty moving from lying on back to sitting on the side of the bed? : A Little Difficulty sitting down on and standing up from a chair with arms (e.g., wheelchair, bedside commode, etc,.)?: A Lot Help needed moving to and from a bed to chair (including a wheelchair)?: A Little Help needed walking in hospital room?: A Little Help needed climbing 3-5 steps with a railing? : A Lot 6 Click Score: 17    End of Session Equipment Utilized During Treatment: Oxygen Activity Tolerance: Patient tolerated treatment well Patient left: with call bell/phone  within reach;in chair   PT Visit Diagnosis: Unsteadiness on feet (R26.81);Other abnormalities of gait and mobility (R26.89);Muscle weakness (generalized) (M62.81)     Time: 7035-0093 PT Time Calculation (min) (ACUTE ONLY): 17 min  Charges:  $Gait Training: 8-22 mins                    G CodesSheran Lawless, [place] [phone] 01/06/2018    Elray Mcgregor 01/06/2018, 5:09 PM

## 2018-01-06 NOTE — Progress Notes (Signed)
Patient has appointment in [facility] with his hematologist for Friday May 17th at [facility].

## 2018-01-06 NOTE — Progress Notes (Signed)
Representative from [facility] called to verify that Joseph [name] is still in need of a bed, verified by RN.

## 2018-01-06 NOTE — Progress Notes (Signed)
[place] TEAM 1 - Stepdown/ICU TEAM  NAPHTALI [name]  JGG:[id number] DOB: 04/08/1951 DOA: 12/27/2017 PCP: Lennart Pall, MD    Brief Narrative:  67 y.o. male with a hx of hypertension, hyperlipidemia, COPD, chronic respiratory failure on 3 L , GERD, CAD, gastroparesis, psoriasis, rheumatoid arthritis, pancytopenia, and diastolic CHF who presented with diarrhea and fever.  He was hospitalized 4/29-5/3 due to HCAP and discharged home on Augmentin.  He developed severe diarrhea the following day w/ more than 20 watery bowel movements and a fever of 102.   ED w/u revealed WBC 4.5, lactic acid 1.58, negative urinalysis, negative C. difficile antigen toxin, temperature 102.8, hypotensive with blood pressure 87/59, tachypnea, oxygen saturation 100% on 3 L nasal cannula oxygen.  Chest x-ray showed known left upper lobe thick-walled cavitary lesion with increased size and increased surrounding airspace consolidation.    Subjective: The patient is resting comfortably at the side of his bed.  He denies chest pain fevers chills nausea or vomiting.  I called and spoke with the patient's Hematologist at [facility] today.  He was very helpful. We formulated a plan to initiate a trial of Granix, and to check his iron stores.  He also reassured me that it would not be unreasonable to discharge the patient home as long as there are no symptoms of an active infection and the patient is otherwise stable.  He offered follow-up in his outpatient clinic later this week if the patient is able to be discharged.  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 (does not have splenomegaly but this does not rule it out) - has been accepted for transfer to [facility] but no beds are available - no fever or infectious sx at this time - begin trial of Granix while inpatient - plan is for patient to arrange an appointment with his Hematologist either Thursday or Friday of this week (any time after 10:00AM), and for the pt to be  d/c from [facility] to drive directly to said appointment (assuming he remains clinically stable in the interim) - should his stay at [facility] be prolonged further, we will ask our own Hematologist to see him in consultation   Normocytic anemia Likely related to string of acute illnesses over last 2 months - check Fe panel to assure Fe supplementation not indicated   SIRS  SIRS physiology resolved w/ simple volume expansion   Diarrhea C. difficile antigen and toxin negative - GI panel negative - most c/w antibiotic associated non-infectious diarrhea -stopped all antibiotics - initiated Imodium - resolved    HCAP / infected pulmonary bulla - Hospitalized 4/29 > 5/3 Clinically the patient appears much improved from a respiratory standpoint - no new recs per Pulmonary - fully stable off abx  Yeast in sputum culture Noted on sputum culture drawn on 12/31/17 - pt clinically stable w/ absolutely no indication of active infection at this time - await culture/speciation and ask Pulm to comment further as indicated   Chronic respiratory failure with hypoxiaand COPD Stable w/o acute exacerbation   Hypokalemia Corrected   Hypomagnesemia  Corrected   Type 2 diabetes, controlled, with neuropathy A1c6.1 08/15/17 - does not require use of diabetes medicines as an outpatient - CBG controlled  HTN BP remains stable w/o medical tx at this time   HLD Continue home medical therapy   CAD Presently asymptomatic - continue home medical therapy  GERD Cont lower dose of PPI   Rheumatoid arthritis Continue home dose of prednisone with no indication  for stress dosing at this time  Chronic diastolic heart failure TTE 08/15/2017 noted EF 55-60% with grade 1 diastolic dysfunction - holding diuretic due to significant dehydration in face of diarrhea - no signif volume overload on exam today   Filed Weights   12/27/17 1725  Weight: 96.6 kg (213 lb)     DVT prophylaxis: Lovenox Code Status: FULL  CODE Family Communication: spoke w/ wife at bedside Disposition Plan: med surg - see details of plan above   Consultants:  PCCM  Antimicrobials:  None  Objective: Blood pressure 116/72, pulse 79, temperature 97.7 F (36.5 C), temperature source Oral, resp. rate 18, height 6\' 2"  (1.88 m), weight 96.6 kg (213 lb), SpO2 98 %.  Intake/Output Summary (Last 24 hours) at 01/06/2018 1453 Last data filed at 01/06/2018 1224 Gross per 24 hour  Intake 360 ml  Output 675 ml  Net -315 ml   Filed Weights   12/27/17 1725  Weight: 96.6 kg (213 lb)    Examination: General: No acute respiratory distress - A&O Lungs: CTA B - no wheezing or crackles  Cardiovascular: RRR Abdomen: NT/ND, soft, bs+ - no ascites  Extremities: trace edema B LE   CBC: Recent Labs  Lab 01/04/18 0344 01/05/18 0702 01/06/18 0438  WBC 2.8* 3.4* 2.8*  NEUTROABS 0.6* 0.7* 0.6*  HGB 11.0* 10.9* 10.8*  HCT 33.9* 33.9* 32.1*  MCV 85.0 85.6 86.1  PLT 271 267 261   Basic Metabolic Panel: Recent Labs  Lab 12/31/17 0348  01/02/18 0657 01/03/18 0429 01/04/18 0344 01/05/18 0702  NA 143   < > 139 138  --  138  K 2.9*   < > 3.6 3.5  --  3.9  CL 110   < > 102 103  --  103  CO2 25   < > 27 28  --  28  GLUCOSE 112*   < > 104* 106*  --  114*  BUN 5*   < > 8 11  --  15  CREATININE 0.56*   < > 0.52* 0.56*  --  0.58*  CALCIUM 8.1*   < > 8.7* 8.7*  --  8.8*  MG 1.9  --   --  1.5* 2.1  --    < > = values in this interval not displayed.   GFR: Estimated Creatinine Clearance: 107 mL/min (A) (by C-G formula based on SCr of 0.58 mg/dL (L)).  Liver Function Tests: Recent Labs  Lab 12/31/17 0348 01/03/18 0429 01/05/18 0702  AST 21 21 22   ALT 19 19 18   ALKPHOS 50 54 57  BILITOT 0.7 0.9 0.7  PROT 5.1* 6.0* 6.1*  ALBUMIN 2.4* 2.6* 2.7*    HbA1C: Hgb A1c MFr Bld  Date/Time Value Ref Range Status  08/15/2017 05:17 AM 6.1 (H) 4.8 - 5.6 % Final    Comment:    (NOTE) Pre diabetes:          5.7%-6.4% Diabetes:               >6.4% Glycemic control for   <7.0% adults with diabetes   06/24/2017 10:42 AM 6.5 4.6 - 6.5 % Final    Comment:    Glycemic Control Guidelines for People with Diabetes:Non Diabetic:  <6%Goal of Therapy: <7%Additional Action Suggested:  >8%     CBG: Recent Labs  Lab 01/02/18 0810 01/02/18 1207 01/02/18 1748 01/06/18 0836 01/06/18 1223  GLUCAP 95 168* 153* 97 138*    Recent Results (from the past 240 hour(s))  Blood Culture (routine x 2)     Status: None   Collection Time: 12/27/17  5:21 PM  Result Value Ref Range Status   Specimen Description BLOOD LEFT FOREARM  Final   Special Requests   Final    BOTTLES DRAWN AEROBIC AND ANAEROBIC Blood Culture adequate volume   Culture   Final    NO GROWTH 5 DAYS Performed at [facility], [address], [place], [place] [address]    Report Status 01/01/2018 FINAL  Final  Blood Culture (routine x 2)     Status: None   Collection Time: 12/27/17  5:22 PM  Result Value Ref Range Status   Specimen Description BLOOD RIGHT WRIST  Final   Special Requests   Final    BOTTLES DRAWN AEROBIC AND ANAEROBIC Blood Culture adequate volume   Culture   Final    NO GROWTH 5 DAYS Performed at [facility], [address], [place], [place] [address]    Report Status 01/01/2018 FINAL  Final  C difficile quick scan w PCR reflex     Status: None   Collection Time: 12/27/17  8:10 PM  Result Value Ref Range Status   C Diff antigen NEGATIVE NEGATIVE Final   C Diff toxin NEGATIVE NEGATIVE Final   C Diff interpretation No C. difficile detected.  Final    Comment: Performed at [facility], [address] [place], [place] [address]  Gastrointestinal Panel by PCR , Stool     Status: None   Collection Time: 12/27/17  8:10 PM  Result Value Ref Range Status   Campylobacter species NOT DETECTED NOT DETECTED Final   Plesimonas shigelloides NOT DETECTED NOT DETECTED Final   Salmonella species NOT DETECTED NOT DETECTED Final    Yersinia enterocolitica NOT DETECTED NOT DETECTED Final   Vibrio species NOT DETECTED NOT DETECTED Final   Vibrio cholerae NOT DETECTED NOT DETECTED Final   Enteroaggregative E coli (EAEC) NOT DETECTED NOT DETECTED Final   Enteropathogenic E coli (EPEC) NOT DETECTED NOT DETECTED Final   Enterotoxigenic E coli (ETEC) NOT DETECTED NOT DETECTED Final   Shiga like toxin producing E coli (STEC) NOT DETECTED NOT DETECTED Final   Shigella/Enteroinvasive E coli (EIEC) NOT DETECTED NOT DETECTED Final   Cryptosporidium NOT DETECTED NOT DETECTED Final   Cyclospora cayetanensis NOT DETECTED NOT DETECTED Final   Entamoeba histolytica NOT DETECTED NOT DETECTED Final   Giardia lamblia NOT DETECTED NOT DETECTED Final   Adenovirus F40/41 NOT DETECTED NOT DETECTED Final   Astrovirus NOT DETECTED NOT DETECTED Final   Norovirus GI/GII NOT DETECTED NOT DETECTED Final   Rotavirus A NOT DETECTED NOT DETECTED Final   Sapovirus (I, II, IV, and V) NOT DETECTED NOT DETECTED Final    Comment: Performed at [facility], [address] [place], [place] [address]  MRSA PCR Screening     Status: None   Collection Time: 12/28/17  5:27 PM  Result Value Ref Range Status   MRSA by PCR NEGATIVE NEGATIVE Final    Comment:        The GeneXpert MRSA Assay (FDA approved for NASAL specimens only), is one component of a comprehensive MRSA colonization surveillance program. It is not intended to diagnose MRSA infection nor to guide or monitor treatment for MRSA infections. Performed at [facility], [address] [place], [place] [address]   Culture, expectorated sputum-assessment     Status: None   Collection Time: 12/31/17  6:53 PM  Result Value Ref Range Status   Specimen Description EXPECTORATED SPUTUM  Final   Special Requests   Final    NONE Performed at [facility], [address] [place], [place] [address]    Sputum evaluation   Final    Sputum specimen not acceptable for testing.   Please recollect.   Results Called to: J. THACKER, RN AT 2327 ON 12/31/17 BY C. JESSUP, MLT.    Report Status 12/31/2017 FINAL  Final  Fungus Culture With Stain     Status: None (Preliminary result)   Collection Time: 12/31/17  6:53 PM  Result Value Ref Range Status   Fungus Stain Final report  Final    Comment: (NOTE) Performed At: [facility] [address] [place], [place] [id number] Jolene Schimke MD [id number]    Fungus (Mycology) Culture PENDING  Incomplete   Fungal Source SPUTUM  Final    Comment: Performed at [facility], [address], [place], [place] [address]  Acid Fast Smear (AFB)     Status: None   Collection Time: 12/31/17  6:53 PM  Result Value Ref Range Status   AFB Specimen Processing Concentration  Final   Acid Fast Smear Negative  Final    Comment: (NOTE) Performed At: [facility] [address] [place], [place] [id number] Jolene Schimke MD [id number]    Source (AFB) SPUTUM  Final    Comment: Performed at [facility], [address], [place], [place] [address]  Fungus Culture Result     Status: None   Collection Time: 12/31/17  6:53 PM  Result Value Ref Range Status   Result 1 Yeast observed  Final    Comment: (NOTE) Performed At: [facility] [address] [place], [place] [id number] Jolene Schimke MD [id number] Performed at [facility], [address], [place], [place] [address]      Scheduled Meds: . aspirin EC  81 mg Oral Daily  . atorvastatin  80 mg Oral QHS  . enoxaparin (LOVENOX) injection  40 mg Subcutaneous Q24H  . ferrous sulfate  325 mg Oral Q breakfast  . fluticasone furoate-vilanterol  1 puff Inhalation Daily  . folic acid  1,000 mcg Oral Daily  . lidocaine  1 patch Transdermal Q24H  . loteprednol  1 drop Both Eyes q morning - 10a  . mouth rinse  15 mL Mouth Rinse BID  . multivitamin  1 tablet Oral Daily  . pantoprazole  40 mg Oral Daily  . predniSONE  15 mg Oral Q breakfast    . pregabalin  75 mg Oral BID  . umeclidinium bromide  1 puff Inhalation Daily  . vitamin C  500 mg Oral Daily     LOS: 9 days   Lonia Blood, MD [facility] Office  [phone] Pager - Text Page per Amion as per below:  On-Call/Text Page:      [email or web address]      password [email or web address]  If 7PM-7AM, please contact night-coverage [email or web address] Password [name] 01/06/2018, 2:53 PM

## 2018-01-07 ENCOUNTER — Telehealth: Payer: Self-pay | Admitting: Cardiology

## 2018-01-07 ENCOUNTER — Inpatient Hospital Stay (HOSPITAL_COMMUNITY): Payer: Medicare HMO

## 2018-01-07 DIAGNOSIS — E7849 Other hyperlipidemia: Secondary | ICD-10-CM

## 2018-01-07 DIAGNOSIS — J189 Pneumonia, unspecified organism: Secondary | ICD-10-CM

## 2018-01-07 DIAGNOSIS — M25551 Pain in right hip: Secondary | ICD-10-CM

## 2018-01-07 DIAGNOSIS — I2510 Atherosclerotic heart disease of native coronary artery without angina pectoris: Secondary | ICD-10-CM

## 2018-01-07 DIAGNOSIS — M25552 Pain in left hip: Secondary | ICD-10-CM

## 2018-01-07 LAB — RETICULOCYTES
RBC.: 3.78 MIL/uL — AB (ref 4.22–5.81)
RETIC COUNT ABSOLUTE: 90.7 10*3/uL (ref 19.0–186.0)
RETIC CT PCT: 2.4 % (ref 0.4–3.1)

## 2018-01-07 LAB — IRON AND TIBC
Iron: 47 ug/dL (ref 45–182)
Saturation Ratios: 21 % (ref 17.9–39.5)
TIBC: 223 ug/dL — ABNORMAL LOW (ref 250–450)
UIBC: 176 ug/dL

## 2018-01-07 LAB — CBC WITH DIFFERENTIAL/PLATELET
BASOS PCT: 0 %
Basophils Absolute: 0 10*3/uL (ref 0.0–0.1)
Eosinophils Absolute: 0 10*3/uL (ref 0.0–0.7)
Eosinophils Relative: 1 %
HCT: 32.3 % — ABNORMAL LOW (ref 39.0–52.0)
Hemoglobin: 10.7 g/dL — ABNORMAL LOW (ref 13.0–17.0)
LYMPHS ABS: 1.5 10*3/uL (ref 0.7–4.0)
Lymphocytes Relative: 39 %
MCH: 28.3 pg (ref 26.0–34.0)
MCHC: 33.1 g/dL (ref 30.0–36.0)
MCV: 85.4 fL (ref 78.0–100.0)
MONO ABS: 0.7 10*3/uL (ref 0.1–1.0)
Monocytes Relative: 19 %
NEUTROS ABS: 1.7 10*3/uL (ref 1.7–7.7)
NEUTROS PCT: 41 %
PLATELETS: 295 10*3/uL (ref 150–400)
RBC: 3.78 MIL/uL — ABNORMAL LOW (ref 4.22–5.81)
RDW: 15.9 % — AB (ref 11.5–15.5)
WBC: 3.9 10*3/uL — ABNORMAL LOW (ref 4.0–10.5)

## 2018-01-07 LAB — COMPREHENSIVE METABOLIC PANEL
ALBUMIN: 2.6 g/dL — AB (ref 3.5–5.0)
ALT: 16 U/L — ABNORMAL LOW (ref 17–63)
ANION GAP: 10 (ref 5–15)
AST: 17 U/L (ref 15–41)
Alkaline Phosphatase: 60 U/L (ref 38–126)
BUN: 13 mg/dL (ref 6–20)
CHLORIDE: 100 mmol/L — AB (ref 101–111)
CO2: 28 mmol/L (ref 22–32)
Calcium: 9 mg/dL (ref 8.9–10.3)
Creatinine, Ser: 0.54 mg/dL — ABNORMAL LOW (ref 0.61–1.24)
GFR calc Af Amer: >60 mL/min (ref >60)
GFR calc non Af Amer: >60 mL/min (ref >60)
GLUCOSE: 131 mg/dL — AB (ref 65–99)
POTASSIUM: 3.4 mmol/L — AB (ref 3.5–5.1)
SODIUM: 138 mmol/L (ref 135–145)
TOTAL PROTEIN: 6 g/dL — AB (ref 6.5–8.1)
Total Bilirubin: 0.8 mg/dL (ref 0.3–1.2)

## 2018-01-07 LAB — VITAMIN B12: Vitamin B-12: 1060 pg/mL — ABNORMAL HIGH (ref 180–914)

## 2018-01-07 LAB — GLUCOSE, CAPILLARY
GLUCOSE-CAPILLARY: 82 mg/dL (ref 65–99)
GLUCOSE-CAPILLARY: 123 mg/dL — AB (ref 65–99)
GLUCOSE-CAPILLARY: 195 mg/dL — AB (ref 65–99)
Glucose-Capillary: 192 mg/dL — ABNORMAL HIGH (ref 65–99)

## 2018-01-07 LAB — FOLATE: Folate: 14.7 ng/mL (ref >5.9)

## 2018-01-07 LAB — FERRITIN: Ferritin: 1428 ng/mL — ABNORMAL HIGH (ref 24–336)

## 2018-01-07 LAB — MAGNESIUM: Magnesium: 1.6 mg/dL — ABNORMAL LOW (ref 1.7–2.4)

## 2018-01-07 MED ORDER — MAGNESIUM OXIDE 400 (241.3 MG) MG PO TABS
400.0000 mg | ORAL_TABLET | Freq: Two times a day (BID) | ORAL | Status: AC
  Administered 2018-01-07 (×2): 400 mg via ORAL
  Filled 2018-01-07 (×2): qty 1

## 2018-01-07 MED ORDER — POTASSIUM CHLORIDE CRYS ER 20 MEQ PO TBCR
50.0000 meq | EXTENDED_RELEASE_TABLET | Freq: Once | ORAL | Status: AC
  Administered 2018-01-07: 50 meq via ORAL
  Filled 2018-01-07: qty 1

## 2018-01-07 MED ORDER — INSULIN ASPART 100 UNIT/ML ~~LOC~~ SOLN
0.0000 [IU] | SUBCUTANEOUS | Status: DC
  Administered 2018-01-07 – 2018-01-08 (×4): 2 [IU] via SUBCUTANEOUS

## 2018-01-07 NOTE — Progress Notes (Signed)
Results for DERRIN, CURREY (MRN [id number]) as of 01/07/2018 17:53  Ref. Range 01/07/2018 16:59  Glucose-Capillary Latest Ref Range: 65 - 99 mg/dL 403 (H)  Dr. Lucretia Roers text paged

## 2018-01-07 NOTE — Telephone Encounter (Signed)
New Message:    Wife wanted Dr Antoine Poche to know that the pt is in the hospital- It is not Cardiac related.

## 2018-01-07 NOTE — Progress Notes (Signed)
PROGRESS NOTE    Joseph [name]  DEY:[id number] DOB: [birth year] DOA: 12/27/2017 PCP: Lennart Pall, MD   Brief Narrative:  67 y.o. WM PMHx HTN HLD, COPD, Chronic respiratory failure on 3 L [place], CAD, Chronic Diastolic CHF Gastroparesis, Psoriasis, Rheumatoid arthritis, pancytopenia,   Presented with diarrhea and fever.  He was hospitalized 4/29-5/3 due to HCAP and discharged home on Augmentin.  He developed severe diarrhea the following day w/ more than 20 watery bowel movements and a fever of 102.    ED w/u revealed WBC 4.5, lactic acid 1.58, negative urinalysis, negative C. difficile antigen toxin, temperature 102.8, hypotensive with blood pressure 87/59, tachypnea, oxygen saturation 100% on 3 L nasal cannula oxygen.  Chest x-ray showed known left upper lobe thick-walled cavitary lesion with increased size and increased surrounding airspace consolidation.    Subjective: 5/15 A/O x4.  States lower back pain in the SI joint area started approximately 1 week ago no inciting factor.  LEFT>> RIGHT.  Positive chronic S OB.  CP from this a.m. has resolved.  Wife will drive him down to [facility] for oncology appointment on Friday.    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  Although patient does not have splenomegaly does not fully rule it out.  Normocytic anemia   Normocytic anemia Likely related to string of acute illnesses over  last 2 months - check Fe panel to assure Fe supplementation not indicated    SIRS  - SIRS physiology resolved with volume expansion  Diarrhea - C. difficile antigen/toxin negative -GI panel negative - Most consistent with antibiotic associated noninfectious diarrhea.   -All antibiotics resolved and Imodium was initiated resolved diarrhea  Chronic respiratory failure with hypoxia/COPD -Stable  HCAP/infected pulmonary bulla - Hospitalized 4/29>> 5/3 -Currently stable off antibiotics. - No new recommendations from PCCM   Yeast in sputum - Noted on sputum culture 5/8 currently patient clinically improved  -Clinically monitor    Diabetes type 2 controlled with complication (neuropathy) - Hemoglobin A1c pending -Lipid panel pending - Sensitive SSI  HLD -Lipid panel pending -Continue home medication for now  Chronic Diastolic CHF - 08/15/2017 EF 55 to 60% - Daily weight - Strict in and out - HTN - Stable  CAD   Hypokalemia -Potassium goal> 4 -K-dur 50 mEq  Hypomagnesemia -Magnesium goal> 2 -Mag-Ox 400 mg BID x2 dose    Rheumatoid arthritis - Continue home dose prednisone   Bilateral HIP pain - Obtain hip/pelvic x-ray evaluate SI joint and hips for increased arthritis/pathologic fracture.   DVT prophylaxis: Lovenox Code Status: Partial Family Communication: At bedside for discussion of plan of care Disposition Plan: Discharge on Friday with patient to proceed to [facility] oncology appointment   Consultants:  PCCM  Procedures/Significant Events:     I have personally reviewed and interpreted all radiology studies and my findings are as above.  VENTILATOR SETTINGS:    Cultures   Antimicrobials: Anti-infectives (From admission, onward)  Start     Stop   12/28/17 1800  ceFEPIme (MAXIPIME) 2 g in sodium chloride 0.9 % 100 mL IVPB  Status:  Discontinued     12/29/17 1702   12/27/17 2100  ceFEPIme (MAXIPIME) 2 g in sodium chloride 0.9 % 100 mL IVPB   Status:  Discontinued     12/28/17 1205   12/27/17 2100  vancomycin (VANCOCIN) IVPB 1000 mg/200 mL premix  Status:  Discontinued     12/29/17 1702   12/27/17 2000  metroNIDAZOLE (FLAGYL) IVPB 500 mg     12/27/17 2219       Devices    LINES / TUBES:      Continuous Infusions:   Objective: Vitals:   01/06/18 1410 01/06/18 2150 01/07/18 0348 01/07/18 0705  BP: 116/72 (!) 105/52 107/72 136/82  Pulse: 79 72 73   Resp: 18 20 18    Temp: 97.7 F (36.5 C) 99 F (37.2 C) 98.2 F (36.8 C)   TempSrc: Oral Oral Oral   SpO2: 98% 98% 98% 97%  Weight:      Height:        Intake/Output Summary (Last 24 hours) at 01/07/2018 01/09/2018 Last data filed at 01/07/2018 01/09/2018 Gross per 24 hour  Intake 240 ml  Output 1095 ml  Net -855 ml   Filed Weights   12/27/17 1725  Weight: 213 lb (96.6 kg)    Examination:  General: A/O x4, positive chronic respiratory distress Neck:  Negative scars, masses, torticollis, lymphadenopathy, JVD Lungs: Clear to auscultation bilaterally without wheezes or crackles Cardiovascular: Regular rate and rhythm without murmur gallop or rub normal S1 and S2 Abdomen: negative abdominal pain, nondistended, positive soft, bowel sounds, no rebound, no ascites, no appreciable mass Extremities: bilateral upper/bilateral lower extremity deformity secondary to arthritis secondary and worsening.  Able to ambulate with upright walker. Musculoskeletal: Pain to palpation bilateral SI joint LEFT>> Right.  Bilateral hip pain to palpation again LEFT>> RIGHT Skin: Negative rashes, lesions, ulcers Psychiatric:  Negative depression, negative anxiety, negative fatigue, negative mania  Central nervous system:  Cranial nerves II through XII intact, tongue/uvula midline, all extremities muscle strength 4/5, sensation intact throughout, fnegative dysarthria, negative expressive aphasia, negative receptive aphasia.  .     Data Reviewed: Care during the described time interval was  provided by me .  I have reviewed this patient's available data, including medical history, events of note, physical examination, and all test results as part of my evaluation.   CBC: Recent Labs  Lab 01/03/18 0429 01/04/18 0344 01/05/18 0702 01/06/18 0438 01/07/18 0341  WBC 2.7* 2.8* 3.4* 2.8* 3.9*  NEUTROABS 0.5* 0.6* 0.7* 0.6* PENDING  HGB 11.1* 11.0* 10.9* 10.8* 10.7*  HCT 33.3* 33.9* 33.9* 32.1* 32.3*  MCV 85.8 85.0 85.6 86.1 85.4  PLT 241 271 267 261 295   Basic Metabolic Panel: Recent Labs  Lab 01/01/18 0401 01/02/18 0657 01/03/18 0429 01/04/18 0344 01/05/18 0702 01/07/18 0341  NA 142 139 138  --  138 138  K 3.3* 3.6 3.5  --  3.9 3.4*  CL 105 102 103  --  103 100*  CO2 29 27 28   --  28 28  GLUCOSE 110* 104* 106*  --  114* 131*  BUN 7 8 11   --  15 13  CREATININE 0.52* 0.52* 0.56*  --  0.58* 0.54*  CALCIUM 8.5* 8.7* 8.7*  --  8.8* 9.0  MG  --   --  1.5* 2.1  --  1.6*   GFR:  Estimated Creatinine Clearance: 107 mL/min (A) (by C-G formula based on SCr of 0.54 mg/dL (L)). Liver Function Tests: Recent Labs  Lab 01/03/18 0429 01/05/18 0702 01/07/18 0341  AST 21 22 17   ALT 19 18 16*  ALKPHOS 54 57 60  BILITOT 0.9 0.7 0.8  PROT 6.0* 6.1* 6.0*  ALBUMIN 2.6* 2.7* 2.6*   No results for input(s): LIPASE, AMYLASE in the last 168 hours. No results for input(s): AMMONIA in the last 168 hours. Coagulation Profile: No results for input(s): INR, PROTIME in the last 168 hours. Cardiac Enzymes: No results for input(s): CKTOTAL, CKMB, CKMBINDEX, TROPONINI in the last 168 hours. BNP (last 3 results) No results for input(s): PROBNP in the last 8760 hours. HbA1C: No results for input(s): HGBA1C in the last 72 hours. CBG: Recent Labs  Lab 01/02/18 1748 01/06/18 0836 01/06/18 1223 01/06/18 1806 01/06/18 2247  GLUCAP 153* 97 138* 176* 144*   Lipid Profile: No results for input(s): CHOL, HDL, LDLCALC, TRIG, CHOLHDL, LDLDIRECT in the last 72 hours. Thyroid Function  Tests: No results for input(s): TSH, T4TOTAL, FREET4, T3FREE, THYROIDAB in the last 72 hours. Anemia Panel: Recent Labs    01/07/18 0341  VITAMINB12 1,060*  FOLATE 14.7  FERRITIN 1,428*  TIBC 223*  IRON 47  RETICCTPCT 2.4   Urine analysis:    Component Value Date/Time   COLORURINE YELLOW 12/30/2017 0414   APPEARANCEUR HAZY (A) 12/30/2017 0414   LABSPEC 1.011 12/30/2017 0414   PHURINE 6.0 12/30/2017 0414   GLUCOSEU NEGATIVE 12/30/2017 0414   HGBUR LARGE (A) 12/30/2017 0414   HGBUR negative 04/12/2008 1103   BILIRUBINUR NEGATIVE 12/30/2017 0414   KETONESUR NEGATIVE 12/30/2017 0414   PROTEINUR NEGATIVE 12/30/2017 0414   UROBILINOGEN 0.2 10/17/2012 1236   NITRITE NEGATIVE 12/30/2017 0414   LEUKOCYTESUR NEGATIVE 12/30/2017 0414   Sepsis Labs: @LABRCNTIP (procalcitonin:4,lacticidven:4)  ) Recent Results (from the past 240 hour(s))  MRSA PCR Screening     Status: None   Collection Time: 12/28/17  5:27 PM  Result Value Ref Range Status   MRSA by PCR NEGATIVE NEGATIVE Final    Comment:        The GeneXpert MRSA Assay (FDA approved for NASAL specimens only), is one component of a comprehensive MRSA colonization surveillance program. It is not intended to diagnose MRSA infection nor to guide or monitor treatment for MRSA infections. Performed at [facility], [address] [place], [address] [place]   Culture, expectorated sputum-assessment     Status: None   Collection Time: 12/31/17  6:53 PM  Result Value Ref Range Status   Specimen Description EXPECTORATED SPUTUM  Final   Special Requests   Final    NONE Performed at [facility], [address], [place], [address] [place]    Sputum evaluation   Final    Sputum specimen not acceptable for testing.  Please recollect.   Results Called to: J. THACKER, RN AT 2327 ON 12/31/17 BY C. JESSUP, MLT.    Report Status 12/31/2017 FINAL  Final  Fungus Culture With Stain     Status: None (Preliminary result)    Collection Time: 12/31/17  6:53 PM  Result Value Ref Range Status   Fungus Stain Final report  Final    Comment: (NOTE) Performed At: [facility] [address] [place], [address] [place] [id number]    Fungus (Mycology) Culture PENDING  Incomplete   Fungal Source SPUTUM  Final    Comment: Performed at [facility], [address], [place], [place] [address]  Acid Fast Smear (AFB)     Status: None   Collection Time: 12/31/17  6:53 PM  Result Value Ref Range Status   AFB Specimen Processing Concentration  Final   Acid Fast Smear Negative  Final    Comment: (NOTE) Performed At: [facility] [address] [place], [place] [id number] Jolene Schimke MD LY:[id number]    Source (AFB) SPUTUM  Final    Comment: Performed at [facility], [address], [place], [place] [address]  Fungus Culture Result     Status: None   Collection Time: 12/31/17  6:53 PM  Result Value Ref Range Status   Result 1 Yeast observed  Final    Comment: (NOTE) Performed At: [facility] [address] [place], [place] [id number] Jolene Schimke MD HQ:[id number] Performed at [facility], [address], [place], [place] [address]          Radiology Studies: No results found.      Scheduled Meds: . aspirin EC  81 mg Oral Daily  . atorvastatin  80 mg Oral QHS  . enoxaparin (LOVENOX) injection  40 mg Subcutaneous Q24H  . ferrous sulfate  325 mg Oral Q breakfast  . fluticasone furoate-vilanterol  1 puff Inhalation Daily  . folic acid  1,000 mcg Oral Daily  . lidocaine  1 patch Transdermal Q24H  . loteprednol  1 drop Both Eyes q morning - 10a  . mouth rinse  15 mL Mouth Rinse BID  . multivitamin  1 tablet Oral Daily  . pantoprazole  40 mg Oral Daily  . predniSONE  15 mg Oral Q breakfast  . pregabalin  75 mg Oral BID  . Tbo-Filgrastim  480 mcg Subcutaneous q1800  . umeclidinium bromide  1 puff Inhalation Daily  . vitamin C  500  mg Oral Daily   Continuous Infusions:   LOS: 10 days    Time spent: 40 minutes    Zac Torti, Roselind Messier, MD [facility] Pager [phone]   If 7PM-7AM, please contact night-coverage [email or web address] Password [facility] 01/07/2018, 7:26 AM

## 2018-01-08 LAB — BASIC METABOLIC PANEL
ANION GAP: 11 (ref 5–15)
BUN: 12 mg/dL (ref 6–20)
CALCIUM: 9.3 mg/dL (ref 8.9–10.3)
CO2: 26 mmol/L (ref 22–32)
Chloride: 102 mmol/L (ref 101–111)
Creatinine, Ser: 0.54 mg/dL — ABNORMAL LOW (ref 0.61–1.24)
Glucose, Bld: 91 mg/dL (ref 65–99)
POTASSIUM: 3.8 mmol/L (ref 3.5–5.1)
SODIUM: 139 mmol/L (ref 135–145)

## 2018-01-08 LAB — LIPID PANEL
CHOLESTEROL: 125 mg/dL (ref 0–200)
HDL: 27 mg/dL — ABNORMAL LOW (ref >40)
LDL Cholesterol: 71 mg/dL (ref 0–99)
Total CHOL/HDL Ratio: 4.6 RATIO
Triglycerides: 135 mg/dL (ref <150)
VLDL: 27 mg/dL (ref 0–40)

## 2018-01-08 LAB — CBC WITH DIFFERENTIAL/PLATELET
BASOS PCT: 0 %
Basophils Absolute: 0 10*3/uL (ref 0.0–0.1)
EOS ABS: 0 10*3/uL (ref 0.0–0.7)
Eosinophils Relative: 0 %
HCT: 34.7 % — ABNORMAL LOW (ref 39.0–52.0)
Hemoglobin: 11.2 g/dL — ABNORMAL LOW (ref 13.0–17.0)
LYMPHS ABS: 1.8 10*3/uL (ref 0.7–4.0)
Lymphocytes Relative: 30 %
MCH: 27 pg (ref 26.0–34.0)
MCHC: 32.3 g/dL (ref 30.0–36.0)
MCV: 83.6 fL (ref 78.0–100.0)
MONO ABS: 0.9 10*3/uL (ref 0.1–1.0)
Monocytes Relative: 15 %
NEUTROS ABS: 3.4 10*3/uL (ref 1.7–7.7)
NEUTROS PCT: 55 %
PLATELETS: 281 10*3/uL (ref 150–400)
RBC: 4.15 MIL/uL — ABNORMAL LOW (ref 4.22–5.81)
RDW: 15.7 % — AB (ref 11.5–15.5)
WBC MORPHOLOGY: INCREASED
WBC: 6.1 10*3/uL (ref 4.0–10.5)

## 2018-01-08 LAB — GLUCOSE, CAPILLARY
GLUCOSE-CAPILLARY: 97 mg/dL (ref 65–99)
GLUCOSE-CAPILLARY: 90 mg/dL (ref 65–99)
GLUCOSE-CAPILLARY: 173 mg/dL — AB (ref 65–99)
GLUCOSE-CAPILLARY: 173 mg/dL — AB (ref 65–99)
Glucose-Capillary: 119 mg/dL — ABNORMAL HIGH (ref 65–99)
Glucose-Capillary: 184 mg/dL — ABNORMAL HIGH (ref 65–99)

## 2018-01-08 LAB — MAGNESIUM: MAGNESIUM: 1.5 mg/dL — AB (ref 1.7–2.4)

## 2018-01-08 LAB — HEMOGLOBIN A1C
Hgb A1c MFr Bld: 6.4 % — ABNORMAL HIGH (ref 4.8–5.6)
Mean Plasma Glucose: 136.98 mg/dL

## 2018-01-08 MED ORDER — MAGNESIUM SULFATE 50 % IJ SOLN
3.0000 g | Freq: Once | INTRAVENOUS | Status: AC
  Administered 2018-01-08: 3 g via INTRAVENOUS
  Filled 2018-01-08: qty 6

## 2018-01-08 NOTE — Progress Notes (Signed)
PROGRESS NOTE    Joseph [name]  YNW:[id number] DOB: [birth year] DOA: 12/27/2017 PCP: Lennart Pall, MD   Brief Narrative:  67 y.o. WM PMHx HTN HLD, COPD, Chronic respiratory failure on 3 L , CAD, Chronic Diastolic CHF Gastroparesis, Psoriasis, Rheumatoid arthritis, pancytopenia,   Presented with diarrhea and fever.  He was hospitalized 4/29-5/3 due to HCAP and discharged home on Augmentin.  He developed severe diarrhea the following day w/ more than 20 watery bowel movements and a fever of 102.    ED w/u revealed WBC 4.5, lactic acid 1.58, negative urinalysis, negative C. difficile antigen toxin, temperature 102.8, hypotensive with blood pressure 87/59, tachypnea, oxygen saturation 100% on 3 L nasal cannula oxygen.  Chest x-ray showed known left upper lobe thick-walled cavitary lesion with increased size and increased surrounding airspace consolidation.    Subjective: 5/16 A/O x4, sitting in the chair states back pain has decreased.  Negative CP, chronic S OB.  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  If stable overnight discharge in a.m. for patient to make his Friday appointment with oncologist/hematologist at [facility]  - Felty syndrome?  Although patient does not have splenomegaly does not fully rule it out.  Normocytic anemia/Anemia of Chronic Disease -Most likely secondary to acute illness over last 2 months Recent Labs  Lab 01/02/18 0657 01/03/18 0429 01/04/18 0344 01/05/18 0702 01/06/18 0438 01/07/18 0341  01/08/18 0404  HGB 10.8* 11.1* 11.0* 10.9* 10.8* 10.7* 11.2*  -Anemia panel consistent with anemia of chronic disease.  SIRS  - SIRS physiology resolved with volume expansion  Diarrhea - C. difficile antigen/toxin negative -GI panel negative - Most consistent with antibiotic associated noninfectious diarrhea.   -All antibiotics resolved and Imodium was initiated resolved diarrhea  Chronic respiratory failure with hypoxia/COPD -Stable  HCAP/infected pulmonary bulla - Hospitalized 4/29>> 5/3 -Currently stable off antibiotics. - No new recommendations from PCCM   Yeast in sputum - Noted on sputum culture 5/8 currently patient clinically improved  -Clinically monitor   Diabetes type 2 controlled with complication (neuropathy) - 5/16 hemoglobin A1c= 6.4 -Lipid panel within ADA guidelines - Sensitive SSI   HLD -Lipid panel within ADA guidelines -Lipitor 80 mg daily  Chronic Diastolic CHF - 08/15/2017 EF 55 to 60% - Daily weight Filed Weights   12/27/17 1725 01/07/18 1051 01/08/18 0500  Weight: 213 lb (96.6 kg) 207 lb 3.7 oz (94 kg) 207 lb 3.7 oz (94 kg)  - Strict in and out since admission -4.4 L  HTN - Stable  CAD   Hypokalemia -Potassium goal> 4  Hypomagnesemia -Magnesium goal > 2 -Magnesium IV 3 g     Rheumatoid arthritis - Prednisone 15 mg daily   Bilateral HIP pain - Obtain hip/pelvic x-ray evaluate SI joint and hips for increased arthritis/pathologic fracture.   DVT prophylaxis: Lovenox Code Status: Partial Family Communication: At bedside for discussion of plan of care Disposition Plan: Discharge on Friday with patient to proceed to [facility] oncology appointment   Consultants:  PCCM  Procedures/Significant Events:     I have personally  reviewed and interpreted all radiology studies and my findings are as above.  VENTILATOR SETTINGS:    Cultures   Antimicrobials: Anti-infectives (From admission, onward)   Start     Stop   12/28/17  1800  ceFEPIme (MAXIPIME) 2 g in sodium chloride 0.9 % 100 mL IVPB  Status:  Discontinued     12/29/17 1702   12/27/17 2100  ceFEPIme (MAXIPIME) 2 g in sodium chloride 0.9 % 100 mL IVPB  Status:  Discontinued     12/28/17 1205   12/27/17 2100  vancomycin (VANCOCIN) IVPB 1000 mg/200 mL premix  Status:  Discontinued     12/29/17 1702   12/27/17 2000  metroNIDAZOLE (FLAGYL) IVPB 500 mg     12/27/17 2219       Devices    LINES / TUBES:      Continuous Infusions:   Objective: Vitals:   01/07/18 1051 01/07/18 1318 01/07/18 2037 01/08/18 0500  BP:  (!) 108/58 106/69 (!) 104/48  Pulse:  (!) 42 72 68  Resp:  20 18 18   Temp:  99.3 F (37.4 C) 98.2 F (36.8 C) 99 F (37.2 C)  TempSrc:  Oral  Oral  SpO2:  98% 97% 95%  Weight: 207 lb 3.7 oz (94 kg)   207 lb 3.7 oz (94 kg)  Height: 6\' 2"  (1.88 m)       Intake/Output Summary (Last 24 hours) at 01/08/2018 0846 Last data filed at 01/08/2018 0200 Gross per 24 hour  Intake 600 ml  Output 1630 ml  Net -1030 ml   Filed Weights   12/27/17 1725 01/07/18 1051 01/08/18 0500  Weight: 213 lb (96.6 kg) 207 lb 3.7 oz (94 kg) 207 lb 3.7 oz (94 kg)    Physical Exam:  General: A/O x4, positive chronic  respiratory distress Neck:  Negative scars, masses, torticollis, lymphadenopathy, JVD Lungs: Clear to auscultation bilaterally without wheezes or crackles Cardiovascular: Regular rate and rhythm without murmur gallop or rub normal S1 and S2 Abdomen: negative abdominal pain, nondistended, positive soft, bowel sounds, no rebound, no ascites, no appreciable mass Extremities: bilateral upper/bilateral lower extremity deformity secondary to arthritis.  Skin: Negative rashes, lesions, ulcers Psychiatric:  Negative depression, negative anxiety, negative fatigue, negative mania  Central nervous system:  Cranial nerves II through XII intact, tongue/uvula midline, all extremities muscle strength 5/5, sensation intact throughout,  negative dysarthria,  negative expressive aphasia, negative receptive aphasia.  .     Data Reviewed: Care during the described time interval was provided by me .  I have reviewed this patient's available data, including medical history, events of note, physical examination, and all test results as part of my evaluation.   CBC: Recent Labs  Lab 01/04/18 0344 01/05/18 0702 01/06/18 0438 01/07/18 0341 01/08/18 0404  WBC 2.8* 3.4* 2.8* 3.9* 6.1  NEUTROABS 0.6* 0.7* 0.6* 1.7 3.4  HGB 11.0* 10.9* 10.8* 10.7* 11.2*  HCT 33.9* 33.9* 32.1* 32.3* 34.7*  MCV 85.0 85.6 86.1 85.4 83.6  PLT 271 267 261 295 281   Basic Metabolic Panel: Recent Labs  Lab 01/02/18 0657 01/03/18 0429 01/04/18 0344 01/05/18 0702 01/07/18 0341 01/08/18 0404  NA 139 138  --  138 138 139  K 3.6 3.5  --  3.9 3.4* 3.8  CL 102 103  --  103 100* 102  CO2 27 28  --  28 28 26   GLUCOSE 104* 106*  --  114* 131* 91  BUN 8 11  --  15 13 12   CREATININE  0.52* 0.56*  --  0.58* 0.54* 0.54*  CALCIUM 8.7* 8.7*  --  8.8* 9.0 9.3  MG  --  1.5* 2.1  --  1.6* 1.5*   GFR: Estimated Creatinine Clearance: 107 mL/min (A) (by C-G formula based on SCr of 0.54 mg/dL (L)). Liver Function Tests: Recent Labs  Lab 01/03/18 0429 01/05/18 0702 01/07/18 0341  AST 21 22 17   ALT 19 18 16*  ALKPHOS 54 57 60  BILITOT 0.9 0.7 0.8  PROT 6.0* 6.1* 6.0*  ALBUMIN 2.6* 2.7* 2.6*   No results for input(s): LIPASE, AMYLASE in the last 168 hours. No results for input(s): AMMONIA in the last 168 hours. Coagulation Profile: No results for input(s): INR, PROTIME in the last 168 hours. Cardiac Enzymes: No results for input(s): CKTOTAL, CKMB, CKMBINDEX, TROPONINI in the last 168 hours. BNP (last 3 results) No results for input(s): PROBNP in the last 8760 hours. HbA1C: Recent Labs    01/08/18 0404  HGBA1C 6.4*   CBG: Recent Labs  Lab 01/07/18 1216 01/07/18 1659 01/07/18 2010 01/08/18 0030 01/08/18 0506  GLUCAP 123* 195* 192* 119* 97   Lipid  Profile: Recent Labs    01/08/18 0404  CHOL 125  HDL 27*  LDLCALC 71  TRIG 01/10/18  CHOLHDL 4.6   Thyroid Function Tests: No results for input(s): TSH, T4TOTAL, FREET4, T3FREE, THYROIDAB in the last 72 hours. Anemia Panel: Recent Labs    01/07/18 0341  VITAMINB12 1,060*  FOLATE 14.7  FERRITIN 1,428*  TIBC 223*  IRON 47  RETICCTPCT 2.4   Urine analysis:    Component Value Date/Time   COLORURINE YELLOW 12/30/2017 0414   APPEARANCEUR HAZY (A) 12/30/2017 0414   LABSPEC 1.011 12/30/2017 0414   PHURINE 6.0 12/30/2017 0414   GLUCOSEU NEGATIVE 12/30/2017 0414   HGBUR LARGE (A) 12/30/2017 0414   HGBUR negative 04/12/2008 1103   BILIRUBINUR NEGATIVE 12/30/2017 0414   KETONESUR NEGATIVE 12/30/2017 0414   PROTEINUR NEGATIVE 12/30/2017 0414   UROBILINOGEN 0.2 10/17/2012 1236   NITRITE NEGATIVE 12/30/2017 0414   LEUKOCYTESUR NEGATIVE 12/30/2017 0414   Sepsis Labs: @LABRCNTIP (procalcitonin:4,lacticidven:4)  ) Recent Results (from the past 240 hour(s))  Culture, expectorated sputum-assessment     Status: None   Collection Time: 12/31/17  6:53 PM  Result Value Ref Range Status   Specimen Description EXPECTORATED SPUTUM  Final   Special Requests   Final    NONE Performed at [facility], [address], [place], [address] [place]    Sputum evaluation   Final    Sputum specimen not acceptable for testing.  Please recollect.   Results Called to: J. THACKER, RN AT 2327 ON 12/31/17 BY C. JESSUP, MLT.    Report Status 12/31/2017 FINAL  Final  Fungus Culture With Stain     Status: None   Collection Time: 12/31/17  6:53 PM  Result Value Ref Range Status   Fungus Stain Final report  Final   Fungus (Mycology) Culture Preliminary report  Final    Comment: (NOTE) Performed At: [facility] [address] [place], [address] [place] [id number]    Fungal Source SPUTUM  Final    Comment: Performed at [facility], [address],  [place], [address] [place]  Acid Fast Smear (AFB)     Status: None   Collection Time: 12/31/17  6:53 PM  Result Value Ref Range Status   AFB Specimen Processing Concentration  Final   Acid Fast Smear Negative  Final  Comment: (NOTE) Performed At: [facility] [address] [place], [place] [id number] Jolene Schimke MD GY:[id number]    Source (AFB) SPUTUM  Final    Comment: Performed at [facility], [address], [place], [place] [address]  Fungus Culture Result     Status: None   Collection Time: 12/31/17  6:53 PM  Result Value Ref Range Status   Result 1 Yeast observed  Final    Comment: (NOTE) Performed At: [facility] [address] [place], [place] [id number] Jolene Schimke MD JI:[id number] Performed at [facility], [address], [place], [place] [address]   Fungal organism reflex     Status: None   Collection Time: 12/31/17  6:53 PM  Result Value Ref Range Status   Fungal result 1 Candida albicans  Final    Comment: (NOTE) Moderate growth Performed At: [facility] [address] [place], [place] [id number] Jolene Schimke MD [id number] Performed at [facility], [address] [place], [place] [address]          Radiology Studies: Dg Hips Bilat With Pelvis 2v  Result Date: 01/07/2018 CLINICAL DATA:  Pain in both hips for a week EXAM: DG HIP (WITH OR WITHOUT PELVIS) 2V BILAT COMPARISON:  CT 08/14/2017 FINDINGS: Mild SI joint degenerative changes. Pubic symphysis and rami are intact. 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 No fracture or malalignment. Vascular calcifications. IMPRESSION: Mild arthritis of the hips.  No acute osseous abnormality Electronically Signed   By: Jasmine Pang M.D.   On: 01/07/2018 20:25        Scheduled Meds: . aspirin EC  81 mg Oral Daily  . atorvastatin  80 mg Oral QHS  . enoxaparin (LOVENOX) injection  40 mg Subcutaneous Q24H  . ferrous sulfate  325 mg  Oral Q breakfast  . fluticasone furoate-vilanterol  1 puff Inhalation Daily  . folic acid  1,000 mcg Oral Daily  . insulin aspart  0-9 Units Subcutaneous Q4H  . lidocaine  1 patch Transdermal Q24H  . loteprednol  1 drop Both Eyes q morning - 10a  . mouth rinse  15 mL Mouth Rinse BID  . multivitamin  1 tablet Oral Daily  . pantoprazole  40 mg Oral Daily  . predniSONE  15 mg Oral Q breakfast  . pregabalin  75 mg Oral BID  . Tbo-Filgrastim  480 mcg Subcutaneous q1800  . umeclidinium bromide  1 puff Inhalation Daily  . vitamin C  500 mg Oral Daily   Continuous Infusions:   LOS: 11 days    Time spent: 40 minutes    Renise Gillies, Roselind Messier, MD [facility] Pager [phone]   If 7PM-7AM, please contact night-coverage [email or web address] Password [facility] 01/08/2018, 8:46 AM

## 2018-01-08 NOTE — Progress Notes (Signed)
OT Treatment Note    01/08/18 1800  OT Visit Information  Last OT Received On 01/08/18  Assistance Needed +1  History of Present Illness Pt is a 67 y/o male admitted secondary to fever and diarrhea. Pt with recent admission to [facility] for pneumonia. PMH includes DM, HTN, CAD, dCHF, COPD on 3L of oxygen at home, and RA.   Precautions  Precautions Fall  Pain Assessment  Pain Assessment Faces  Faces Pain Scale 2  Pain Location back pain  Pain Descriptors / Indicators Aching  Pain Intervention(s) Limited activity within patient's tolerance  Cognition  Arousal/Alertness Awake/alert  Behavior During Therapy WFL for tasks assessed/performed  Overall Cognitive Status Within Functional Limits for tasks assessed  General Comments  General comments (skin integrity, edema, etc.) Educated pt on use of skin prep as a barrier after hand hygiene and thourough drying. also recommended pt use "InterDry" to weave in/out of fingers and around lateral aspect of little finger andinto palm, then place palm guard in hand at night. Disucssed options of using InterDry and educated pt on availability of InterDry.   OT - End of Session  Activity Tolerance Patient tolerated treatment well  Patient left in chair;with call bell/phone within reach  Nurse Communication Other (comment) (pt requesting CBG)  OT Assessment/Plan  OT Plan Discharge plan remains appropriate  OT Visit Diagnosis Muscle weakness (generalized) (M62.81)  OT Frequency (ACUTE ONLY) Min 2X/week  Follow Up Recommendations Home health OT;Supervision/Assistance - 24 hour  OT Equipment Other (comment)  AM-PAC OT "6 Clicks" Daily Activity Outcome Measure  Help from another person eating meals? 4  Help from another person taking care of personal grooming? 3  Help from another person toileting, which includes using toliet, bedpan, or urinal? 3  Help from another person bathing (including washing, rinsing, drying)? 3  Help from another person to put on  and taking off regular upper body clothing? 3  Help from another person to put on and taking off regular lower body clothing? 2  6 Click Score 18  ADL G Code Conversion CK  OT Goal Progression  Progress towards OT goals Progressing toward goals  Acute Rehab OT Goals  Patient Stated Goal to go home   OT Goal Formulation With patient  Time For Goal Achievement 01/19/18  Potential to Achieve Goals Good  ADL Goals  Pt Will Perform Toileting - Clothing Manipulation and hygiene with supervision;sit to/from stand  Pt Will Perform Grooming with supervision;standing  Pt Will Perform Upper Body Bathing with supervision;sitting  Pt Will Perform Lower Body Dressing with supervision;sit to/from stand  Pt Will Transfer to Toilet with supervision;ambulating;bedside commode  Pt Will Perform Tub/Shower Transfer Shower transfer;with supervision;ambulating;rolling walker  Pt Will Perform Lower Body Bathing with supervision;sit to/from stand  OT Time Calculation  OT Start Time (ACUTE ONLY) 1628  OT Stop Time (ACUTE ONLY) 1642  OT Time Calculation (min) 14 min  OT General Charges  $OT Visit 1 Visit  OT Treatments  $Self Care/Home Management  8-22 mins  Luisa Dago, OT/L  OT Clinical Specialist [phone]

## 2018-01-08 NOTE — Progress Notes (Signed)
Occupational Therapy Treatment Patient Details Name: Joseph [name] MRN: [id number] DOB: [birth year] Today's Date: 01/08/2018    History of present illness Pt is a 67 y/o male admitted secondary to fever and diarrhea. Pt with recent admission to [facility] for pneumonia. PMH includes DM, HTN, CAD, dCHF, COPD on 3L of oxygen at home, and RA.    OT comments  Pt making steady progress. Able to complete functional mobility for ADL and ambulate in hallway while having a conversation with therapist with VSS. Pt concerned about ambulating without a gait belt therefore issued pt a gait belt for him to take home and use while he is in the hospital. Recommend HHOT after DC.   Follow Up Recommendations  Home health OT;Supervision/Assistance - 24 hour    Equipment Recommendations    none   Recommendations for Other Services  HHOT    Precautions / Restrictions Precautions Precautions: Fall       Mobility Bed Mobility Overal bed mobility: Needs Assistance             General bed mobility comments: Pt staes bed mobility is difficult for him and his wife normally has to assist him  Transfers Overall transfer level: Needs assistance Equipment used: Bilateral platform walker Transfers: Sit to/from Stand Sit to Stand: Min guard         General transfer comment: requested bed to be elevated when sitting; does this at home    Balance Overall balance assessment: Needs assistance   Sitting balance-Leahy Scale: Good       Standing balance-Leahy Scale: Fair                             ADL either performed or assessed with clinical judgement   ADL Overall ADL's : Needs assistance/impaired     Grooming: Set up;Standing   Upper Body Bathing: Set up;Supervision/ safety   Lower Body Bathing: Set up;Supervison/ safety                       Functional mobility during ADLs: Min guard;Rolling walker(platform RW) General ADL Comments: Used platform RW to complete  functional mobility for ADL. Able to ambulate in hallway while maintaining conversation with minimal SOB     Vision       Perception     Praxis      Cognition Arousal/Alertness: Awake/alert Behavior During Therapy: WFL for tasks assessed/performed Overall Cognitive Status: Within Functional Limits for tasks assessed                                          Exercises Exercises: Other exercises Other Exercises Other Exercises: recommend pt continue wtih BUE strengthening with theraband as tolerated   Shoulder Instructions       General Comments  Pt discussed conerns about his skin becoimng "white" (macerated) due to his hand contractures. Recommend trialing use of no sting barrier prep    Pertinent Vitals/ Pain       Pain Assessment: Faces Faces Pain Scale: Hurts little more Pain Location: back pain Pain Descriptors / Indicators: Aching Pain Intervention(s): Limited activity within patient's tolerance  Home Living  Prior Functioning/Environment              Frequency  Min 2X/week        Progress Toward Goals  OT Goals(current goals can now be found in the care plan section)  Progress towards OT goals: Progressing toward goals  Acute Rehab OT Goals Patient Stated Goal: to go home  OT Goal Formulation: With patient Time For Goal Achievement: 01/19/18 Potential to Achieve Goals: Good ADL Goals Pt Will Perform Grooming: with supervision;standing Pt Will Perform Upper Body Bathing: with supervision;sitting Pt Will Perform Lower Body Bathing: with supervision;sit to/from stand Pt Will Perform Lower Body Dressing: with supervision;sit to/from stand Pt Will Transfer to Toilet: with supervision;ambulating;bedside commode Pt Will Perform Toileting - Clothing Manipulation and hygiene: with supervision;sit to/from stand Pt Will Perform Tub/Shower Transfer: Shower transfer;with  supervision;ambulating;rolling walker  Plan Discharge plan remains appropriate    Co-evaluation                 AM-PAC PT "6 Clicks" Daily Activity     Outcome Measure   Help from another person eating meals?: None Help from another person taking care of personal grooming?: A Little Help from another person toileting, which includes using toliet, bedpan, or urinal?: A Little Help from another person bathing (including washing, rinsing, drying)?: A Little Help from another person to put on and taking off regular upper body clothing?: A Little Help from another person to put on and taking off regular lower body clothing?: A Lot 6 Click Score: 18    End of Session Equipment Utilized During Treatment: Oxygen;Rolling walker(platform RW; 3L)  OT Visit Diagnosis: Muscle weakness (generalized) (M62.81)   Activity Tolerance Patient tolerated treatment well   Patient Left in bed;with call bell/phone within reach;with nursing/sitter in room;with family/visitor present   Nurse Communication Mobility status        Time: 1015-1050 OT Time Calculation (min): 35 min  Charges: OT General Charges $OT Visit: 1 Visit OT Treatments $Self Care/Home Management : 23-37 mins  Luisa Dago, OT/L  OT Clinical Specialist [phone]    [facility] 01/08/2018, 12:04 PM

## 2018-01-08 NOTE — Progress Notes (Signed)
Physical Therapy Treatment Patient Details Name: Joseph [name] MRN: [id number] DOB: [birth year] Today's Date: 01/08/2018    History of Present Illness Pt is a 67 y/o male admitted secondary to fever and diarrhea. Pt with recent admission to [facility] for pneumonia. PMH includes DM, HTN, CAD, dCHF, COPD on 3L of oxygen at home, and RA.     PT Comments    Pt ambulated 450' with RW and 3L O2, no loss of balance. Pt tolerating increased distance with ambulation.   Follow Up Recommendations  Supervision/Assistance - 24 hour;Home health PT;Supervision for mobility/OOB     Equipment Recommendations  Wheelchair (measurements PT);Wheelchair cushion (measurements PT)(lightweight transport w/c)    Recommendations for Other Services       Precautions / Restrictions Precautions Precautions: Fall Restrictions Weight Bearing Restrictions: No    Mobility  Bed Mobility Overal bed mobility: Needs Assistance             General bed mobility comments: sitting on edge of bed  Transfers Overall transfer level: Needs assistance Equipment used: Bilateral platform walker Transfers: Sit to/from Stand Sit to Stand: Min guard;From elevated surface         General transfer comment: bed elevated  Ambulation/Gait Ambulation/Gait assistance: Min guard Ambulation Distance (Feet): 450 Feet Assistive device: Bilateral platform walker Gait Pattern/deviations: Step-through pattern;Trunk flexed;Decreased stride length; one brief standing rest break Gait velocity: Decreased    General Gait Details: leans forward on walker due to flexion contracture in knees and back issues, but reports back pain is improved in standing position, ambulated with 3L O2   Stairs             Wheelchair Mobility    Modified Rankin (Stroke Patients Only)       Balance Overall balance assessment: Needs assistance Sitting-balance support: No upper extremity supported Sitting balance-Leahy Scale: Good Sitting  balance - Comments: seated to don shorts, assist for shoes     Standing balance-Leahy Scale: Fair                              Cognition Arousal/Alertness: Awake/alert Behavior During Therapy: WFL for tasks assessed/performed Overall Cognitive Status: Within Functional Limits for tasks assessed                                        Exercises Other Exercises Other Exercises: recommend pt continue wtih BUE strengthening with theraband as tolerated    General Comments        Pertinent Vitals/Pain Pain Assessment: Faces Pain Score: 4  Faces Pain Scale: Hurts little more Pain Location: back pain Pain Descriptors / Indicators: Aching Pain Intervention(s): Limited activity within patient's tolerance;Monitored during session;Premedicated before session    Home Living                      Prior Function            PT Goals (current goals can now be found in the care plan section) Acute Rehab PT Goals Patient Stated Goal: to go home  PT Goal Formulation: With patient Time For Goal Achievement: 01/14/18 Potential to Achieve Goals: Good Progress towards PT goals: Progressing toward goals    Frequency    Min 3X/week      PT Plan Current plan remains appropriate    Co-evaluation  AM-PAC PT "6 Clicks" Daily Activity  Outcome Measure  Difficulty turning over in bed (including adjusting bedclothes, sheets and blankets)?: None Difficulty moving from lying on back to sitting on the side of the bed? : A Little Difficulty sitting down on and standing up from a chair with arms (e.g., wheelchair, bedside commode, etc,.)?: A Lot Help needed moving to and from a bed to chair (including a wheelchair)?: A Little Help needed walking in hospital room?: A Little Help needed climbing 3-5 steps with a railing? : A Lot 6 Click Score: 17    End of Session Equipment Utilized During Treatment: Oxygen;Gait belt Activity Tolerance:  Patient tolerated treatment well Patient left: with call bell/phone within reach;in bed Nurse Communication: Mobility status PT Visit Diagnosis: Unsteadiness on feet (R26.81);Other abnormalities of gait and mobility (R26.89);Muscle weakness (generalized) (M62.81)     Time: 1884-1660 PT Time Calculation (min) (ACUTE ONLY): 21 min  Charges:  $Gait Training: 8-22 mins                    G Codes:        Tamala Ser 01/08/2018, 2:38 PM  [phone]

## 2018-01-09 ENCOUNTER — Ambulatory Visit
Admit: 2018-01-09 | Discharge: 2018-01-10 | Payer: MEDICARE | Attending: Hematology & Oncology | Primary: Hematology & Oncology

## 2018-01-09 DIAGNOSIS — D709 Neutropenia, unspecified: Principal | ICD-10-CM

## 2018-01-09 DIAGNOSIS — M05711 Rheumatoid arthritis with rheumatoid factor of right shoulder without organ or systems involvement: Secondary | ICD-10-CM

## 2018-01-09 DIAGNOSIS — M05712 Rheumatoid arthritis with rheumatoid factor of left shoulder without organ or systems involvement: Secondary | ICD-10-CM

## 2018-01-09 DIAGNOSIS — Z6827 Body mass index (BMI) 27.0-27.9, adult: Secondary | ICD-10-CM | POA: Diagnosis not present

## 2018-01-09 LAB — CBC WITH DIFFERENTIAL/PLATELET
BAND NEUTROPHILS: 27 %
BLASTS: 0 %
Basophils Absolute: 0 10*3/uL (ref 0.0–0.1)
Basophils Relative: 0 %
Eosinophils Absolute: 0 10*3/uL (ref 0.0–0.7)
Eosinophils Relative: 0 %
HEMATOCRIT: 34.2 % — AB (ref 39.0–52.0)
HEMOGLOBIN: 11 g/dL — AB (ref 13.0–17.0)
LYMPHS PCT: 32 %
Lymphs Abs: 2.3 10*3/uL (ref 0.7–4.0)
MCH: 27.4 pg (ref 26.0–34.0)
MCHC: 32.2 g/dL (ref 30.0–36.0)
MCV: 85.1 fL (ref 78.0–100.0)
MYELOCYTES: 1 %
Metamyelocytes Relative: 1 %
Monocytes Absolute: 0.9 10*3/uL (ref 0.1–1.0)
Monocytes Relative: 12 %
NEUTROS PCT: 27 %
Neutro Abs: 4 10*3/uL (ref 1.7–7.7)
OTHER: 0 %
PROMYELOCYTES RELATIVE: 0 %
Platelets: 244 10*3/uL (ref 150–400)
RBC: 4.02 MIL/uL — ABNORMAL LOW (ref 4.22–5.81)
RDW: 15.8 % — ABNORMAL HIGH (ref 11.5–15.5)
WBC MORPHOLOGY: INCREASED
WBC: 7.2 10*3/uL (ref 4.0–10.5)
nRBC: 0 /100 WBC

## 2018-01-09 LAB — BASIC METABOLIC PANEL
ANION GAP: 11 (ref 5–15)
BUN: 16 mg/dL (ref 6–20)
CHLORIDE: 101 mmol/L (ref 101–111)
CO2: 26 mmol/L (ref 22–32)
Calcium: 8.9 mg/dL (ref 8.9–10.3)
Creatinine, Ser: 0.65 mg/dL (ref 0.61–1.24)
GFR calc Af Amer: >60 mL/min (ref >60)
GFR calc non Af Amer: >60 mL/min (ref >60)
Glucose, Bld: 91 mg/dL (ref 65–99)
POTASSIUM: 3.9 mmol/L (ref 3.5–5.1)
Sodium: 138 mmol/L (ref 135–145)

## 2018-01-09 LAB — GLUCOSE, CAPILLARY
GLUCOSE-CAPILLARY: 106 mg/dL — AB (ref 65–99)
GLUCOSE-CAPILLARY: 84 mg/dL (ref 65–99)
GLUCOSE-CAPILLARY: 88 mg/dL (ref 65–99)

## 2018-01-09 LAB — MAGNESIUM: Magnesium: 1.8 mg/dL (ref 1.7–2.4)

## 2018-01-09 NOTE — Progress Notes (Signed)
Patient refused DME wheelchair in which he states is too large.  Discharge teaching given to patient and wife, including activity, diet, follow-up appoints, and medications. Patient verbalized understanding of all discharge instructions. IV access was d/c'd. Vitals are stable. Skin is intact except as charted in most recent assessments. Pt to be escorted out by NT, to be driven home by wife.

## 2018-01-09 NOTE — Discharge Summary (Signed)
DISCHARGE SUMMARY  Joseph [name]  MR#: [id number]  DOB:[birth year]  Date of Admission: 12/27/2017 Date of Discharge: 01/09/2018  Attending Physician:Korra Christine Silvestre Gunner, MD   Patient's WUJ:[id number], [name], MD  Consults: PCCM  Disposition: D/C home - to be seen at [facility] at 2:00PM day of this D/C  Follow-up Appts: Follow-up Information    [facility] Follow up.   Specialty:  Home Health Services Why:  resumption of home heath services arranged Contact information: [address] [place] [address] [phone]        [facility] - Dme Follow up.   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  He was hospitalized at [facility] 4/29-5/3 due to HCAP / an infected pulmonary bulla, and discharged home on Augmentin. He developed severe diarrhea the following day w/ morethan 20 watery bowel movements and a fever of 102, and returned to the ED.   ED w/u revealed WBC 4.5 (ANC 1.3), lactic acid 1.58, negative urinalysis, negative C. difficile antigen toxin, temperature 102.8, hypotension with blood pressure 87/59, tachypnea, oxygen saturation 100% on 3 L nasal cannula oxygen. Chest x-ray showed  known left upper lobe thick-walled cavitary lesionwithincreased sizeandincreased surrounding airspace consolidation.  Hospital Course:  SIRS  SIRS physiology resolved w/ simple volume expansion - despite initial concern for a worsening pulmonary infection, this proved not to be the case w/ the pt having no sx whatsoever of an active pulmonary infection - PCCM was consulted and agreed w/ stopping abx - he remained stable from a respiratory standpoint th/o the hospital stay, and had no further fever since that recorded at the time of his admit   Persistent Idiopathic Neutropenia ANC was 1.3 at time of admit, but all subsequent f/u labs were 0.5-0.8 - Felty's syndrome (does not have splenomegaly but this does not rule it out) is in the differential - thankfully the pt was not febrile and was not otherwise acutely ill - he remained on his usual chronic dose of prednisone - Neupogen had not been attempted as the pt had been pending transfer to [facility] for further evaluation and tx - when his transfer did not occur, Dr. Sharon Seller called and spoke w/ the patient's Hematologist at [facility] - it was decided to initiate a trial of Granix - the pt received daily doses for 3 days, and enjoyed a brisk response, w/ his WBC and ANC normalizing prior to d/c - he is to f/u w/ his Hematologist in [place] a few hours after his d/c from [facility] - the decision to continue this medication or not will be up to his established Hematologist - at the time of his d/c the pt is entirely asymptomatic   Diarrhea C. difficile antigen and toxin negative - GI panel negative - most c/w antibiotic associated non-infectious diarrhea - stopped  all antibiotics - initiated Imodium - resolved w/ no persisting sx to suggest active infection   HCAP / infected pulmonary bulla - Hospitalized 4/29 > 5/3 Clinically the patient remained entirely stable from a respiratory standpoint - no new recs per Pulmonary when seen during this  admit - see discussion above - has been off abx since 12/29/17  Yeast in sputum culture Noted on sputum culture drawn on 12/31/17 - pt clinically stable w/ absolutely no indication of active pulm infection th/o the hospital stay   Chronic respiratory failure with hypoxiaand COPD Stable w/o acute exacerbation - continue usual home medications and prn [place] support after d/c   Hypokalemia Corrected - due to GI losses and poor intake   Hypomagnesemia  Corrected - due to GI losses and poor intake   Type 2 diabetes, controlled, with neuropathy A1c6.1 08/15/17 - does not require use of diabetes medicines as an outpatient - CBG controlled  HTN BP remains stable w/o medical tx at this time   HLD Continue home medical therapy   CAD Asymptomatic th/o this hospital stay - continue home medical therapy  GERD Cont PPI   Rheumatoid arthritis Continue home dose of prednisone with no indication for stress dosing at this time - no clinical evidence of an acute flair   Chronic diastolic heart failure TTE 08/15/2017 noted EF 55-60% with grade 1 diastolic dysfunction - holding diuretic due to euvolemic state on exam and poor intake - no evidence of volume overload at time of d/c    Allergies as of 01/09/2018   No Known Allergies     Medication List    STOP taking these medications   amoxicillin-clavulanate 875-125 MG tablet Commonly known as:  AUGMENTIN   dextromethorphan-guaiFENesin 30-600 MG 12hr tablet Commonly known as:  MUCINEX DM   furosemide 20 MG tablet Commonly known as:  LASIX   polyethylene glycol packet Commonly known as:  MIRALAX / GLYCOLAX   potassium chloride 10 MEQ tablet Commonly known as:  KLOR-CON 10   pregabalin 75 MG capsule Commonly known as:  LYRICA     TAKE these medications   albuterol 108 (90 Base) MCG/ACT inhaler Commonly known as:  PROAIR HFA Inhale 2 puffs into the lungs every 4 (four) hours as needed for wheezing or shortness of breath.     aspirin EC 81 MG tablet Take 81 mg by mouth daily.   atorvastatin 80 MG tablet Commonly known as:  LIPITOR TAKE 1 TABLET (80mg ) BY MOUTH DAILY What changed:    how much to take  how to take this  when to take this  additional instructions   clobetasol cream 0.05 % Commonly known as:  TEMOVATE Apply 1 application topically 2 (two) times daily as needed (rash).   folic acid 800 MCG tablet Commonly known as:  FOLVITE Take 800 mcg by mouth daily.   hydrocortisone cream 1 % Apply 1 application topically 3 (three) times daily as needed for itching.   ipratropium-albuterol 0.5-2.5 (3) MG/3ML Soln Commonly known as:  DUONEB Take 3 mLs by nebulization every 4 (four) hours as needed (shortness of breath or wheezing).   loperamide 2 MG capsule Commonly known as:  IMODIUM Take 1 capsule (2 mg total) by mouth as needed for diarrhea or loose stools.   LOTEMAX 0.5 % Gel Generic drug:  Loteprednol Etabonate Place 1 drop into both eyes every morning. daily   Melatonin 1 MG Caps Take 4 mg by mouth as needed (Sleep).   nitroGLYCERIN 0.4 MG  SL tablet Commonly known as:  NITROSTAT Place 1 tablet (0.4 mg total) under the tongue every 5 (five) minutes as needed for chest pain (up to 3 doses for severe chest pain. If no relief after 3rd dose, proceed to the ED for an evaluation).   OVER THE COUNTER MEDICATION Apply topically as needed. T-Gel Shampoo   oxyCODONE-acetaminophen 7.5-325 MG tablet Commonly known as:  PERCOCET Take 1 tablet by mouth every 6 (six) hours as needed for moderate pain.   OXYGEN Inhale 3 L into the lungs continuous.   pantoprazole 40 MG tablet Commonly known as:  PROTONIX Take 1 tablet (40 mg total) by mouth 2 (two) times daily.   PERFECT IRON PO Take 50 mg by mouth every morning.   predniSONE 20 MG tablet Commonly known as:  DELTASONE Take 15 mg by mouth daily with breakfast.   sodium chloride 0.65 % Soln nasal spray Commonly known as:   OCEAN Place 1 spray into both nostrils as needed for congestion.   SUPER B COMPLEX PO Take 1 tablet by mouth daily.   TRELEGY ELLIPTA 100-62.5-25 MCG/INH Aepb Generic drug:  Fluticasone-Umeclidin-Vilant Inhale 1 puff into the lungs daily.   vitamin C 500 MG tablet Commonly known as:  ASCORBIC ACID Take 500 mg by mouth daily.   VITEYES AREDS ADVANCED PO Take 1 tablet by mouth daily.            Durable Medical Equipment  (From admission, onward)        Start     Ordered   01/08/18 1156  For home use only DME standard manual wheelchair with seat cushion  Once    Comments:  Patient suffers from limited which impairs their ability to perform daily activities like ambulating in the home.  A walker will not resolve  issue with performing activities of daily living. A wheelchair will allow patient to safely perform daily activities. Patient can safely propel the wheelchair in the home or has a caregiver who can provide assistance.  Accessories: elevating leg rests (ELRs), wheel locks, extensions and anti-tippers.  Light weight wheelchair   01/08/18 1157      Day of Discharge BP 116/66 (BP Location: Left Arm)   Pulse 66   Temp 98.1 F (36.7 C) (Oral)   Resp 16   Ht 6\' 2"  (1.88 m)   Wt 94.1 kg (207 lb 7.3 oz)   SpO2 97%   BMI 26.64 kg/m   Physical Exam: General: No acute respiratory distress Lungs: Clear to auscultation bilaterally without wheezes or crackles Cardiovascular: Regular rate and rhythm without murmur gallop or rub normal S1 and S2 Abdomen: Nontender, nondistended, soft, bowel sounds positive, no rebound, no ascites, no appreciable mass Extremities: No significant cyanosis, clubbing, or edema bilateral lower extremities  Basic Metabolic Panel: Recent Labs  Lab 01/03/18 0429 01/04/18 0344 01/05/18 0702 01/07/18 0341 01/08/18 0404 01/09/18 0505  NA 138  --  138 138 139 138  K 3.5  --  3.9 3.4* 3.8 3.9  CL 103  --  103 100* 102 101  CO2 28  --  28  28 26 26   GLUCOSE 106*  --  114* 131* 91 91  BUN 11  --  15 13 12 16   CREATININE 0.56*  --  0.58* 0.54* 0.54* 0.65  CALCIUM 8.7*  --  8.8* 9.0 9.3 8.9  MG 1.5* 2.1  --  1.6* 1.5* 1.8    Liver Function Tests: Recent Labs  Lab 01/03/18 0429 01/05/18 8453  01/07/18 0341  AST 21 22 17   ALT 19 18 16*  ALKPHOS 54 57 60  BILITOT 0.9 0.7 0.8  PROT 6.0* 6.1* 6.0*  ALBUMIN 2.6* 2.7* 2.6*    CBC: Recent Labs  Lab 01/05/18 0702 01/06/18 0438 01/07/18 0341 01/08/18 0404 01/09/18 0505  WBC 3.4* 2.8* 3.9* 6.1 7.2  NEUTROABS 0.7* 0.6* 1.7 3.4 4.0  HGB 10.9* 10.8* 10.7* 11.2* 11.0*  HCT 33.9* 32.1* 32.3* 34.7* 34.2*  MCV 85.6 86.1 85.4 83.6 85.1  PLT 267 261 295 281 244    CBG: Recent Labs  Lab 01/08/18 1649 01/08/18 2010 01/09/18 0019 01/09/18 0452 01/09/18 0757  GLUCAP 184* 173* 106* 88 84    Recent Results (from the past 240 hour(s))  Culture, expectorated sputum-assessment     Status: None   Collection Time: 12/31/17  6:53 PM  Result Value Ref Range Status   Specimen Description EXPECTORATED SPUTUM  Final   Special Requests   Final    NONE Performed at [facility], [address] [place], [place]    Sputum evaluation   Final    Sputum specimen not acceptable for testing.  Please recollect.   Results Called to: J. THACKER, RN AT 2327 ON 12/31/17 BY C. JESSUP, MLT.    Report Status 12/31/2017 FINAL  Final  Fungus Culture With Stain     Status: None   Collection Time: 12/31/17  6:53 PM  Result Value Ref Range Status   Fungus Stain Final report  Final   Fungus (Mycology) Culture Preliminary report  Final    Comment: (NOTE) Performed At: [facility] [address] [place], [place] [id number] MD Jolene Schimke    Fungal Source SPUTUM  Final    Comment: Performed at [facility], [address] [place], [place]  Acid Fast Smear (AFB)     Status: None   Collection Time: 12/31/17  6:53 PM  Result Value Ref  Range Status   AFB Specimen Processing Concentration  Final   Acid Fast Smear Negative  Final    Comment: (NOTE) Performed At: [facility] [address] [place], [place] [id number] MD Jolene Schimke    Source (AFB) SPUTUM  Final    Comment: Performed at [facility], [address] [place], [place]  Fungus Culture Result     Status: None   Collection Time: 12/31/17  6:53 PM  Result Value Ref Range Status   Result 1 Yeast observed  Final    Comment: (NOTE) Performed At: [facility] [address] [place], [place] [id number] MD Jolene Schimke Performed at [facility], [address] [place], [place]   Fungal organism reflex     Status: None   Collection Time: 12/31/17  6:53 PM  Result Value Ref Range Status   Fungal result 1 Candida albicans  Final    Comment: (NOTE) Moderate growth Performed At: [facility] [address] [place], [place] [id number] MD Jolene Schimke Performed at [facility], [address], [place], [place]      Time spent in discharge (includes decision making & examination of pt): 35 minutes  01/09/2018, 2:26 PM   01/11/2018, MD [facility] Office  [phone] Pager [phone]  On-Call/Text Page:      [phone]      password [facility]

## 2018-01-10 DIAGNOSIS — R531 Weakness: Secondary | ICD-10-CM | POA: Diagnosis not present

## 2018-01-10 DIAGNOSIS — R404 Transient alteration of awareness: Secondary | ICD-10-CM | POA: Diagnosis not present

## 2018-01-11 ENCOUNTER — Inpatient Hospital Stay (HOSPITAL_COMMUNITY)
Admission: EM | Admit: 2018-01-11 | Discharge: 2018-01-24 | DRG: 871 | Disposition: A | Discharge status: Home Health | Payer: Medicare HMO | Attending: Internal Medicine | Admitting: Internal Medicine

## 2018-01-11 ENCOUNTER — Inpatient Hospital Stay (HOSPITAL_COMMUNITY): Payer: Medicare HMO

## 2018-01-11 ENCOUNTER — Emergency Department (HOSPITAL_COMMUNITY): Payer: Medicare HMO

## 2018-01-11 ENCOUNTER — Encounter (HOSPITAL_COMMUNITY): Payer: Self-pay | Admitting: Oncology

## 2018-01-11 DIAGNOSIS — R6521 Severe sepsis with septic shock: Secondary | ICD-10-CM | POA: Diagnosis present

## 2018-01-11 DIAGNOSIS — J9621 Acute and chronic respiratory failure with hypoxia: Secondary | ICD-10-CM | POA: Diagnosis not present

## 2018-01-11 DIAGNOSIS — E876 Hypokalemia: Secondary | ICD-10-CM | POA: Diagnosis not present

## 2018-01-11 DIAGNOSIS — Z8619 Personal history of other infectious and parasitic diseases: Secondary | ICD-10-CM | POA: Diagnosis not present

## 2018-01-11 DIAGNOSIS — I482 Chronic atrial fibrillation: Secondary | ICD-10-CM | POA: Diagnosis present

## 2018-01-11 DIAGNOSIS — R509 Fever, unspecified: Secondary | ICD-10-CM | POA: Diagnosis present

## 2018-01-11 DIAGNOSIS — K219 Gastro-esophageal reflux disease without esophagitis: Secondary | ICD-10-CM | POA: Diagnosis present

## 2018-01-11 DIAGNOSIS — E785 Hyperlipidemia, unspecified: Secondary | ICD-10-CM | POA: Diagnosis not present

## 2018-01-11 DIAGNOSIS — I110 Hypertensive heart disease with heart failure: Secondary | ICD-10-CM | POA: Diagnosis not present

## 2018-01-11 DIAGNOSIS — K3184 Gastroparesis: Secondary | ICD-10-CM | POA: Diagnosis not present

## 2018-01-11 DIAGNOSIS — I252 Old myocardial infarction: Secondary | ICD-10-CM

## 2018-01-11 DIAGNOSIS — K5900 Constipation, unspecified: Secondary | ICD-10-CM | POA: Diagnosis not present

## 2018-01-11 DIAGNOSIS — E2740 Unspecified adrenocortical insufficiency: Secondary | ICD-10-CM | POA: Diagnosis not present

## 2018-01-11 DIAGNOSIS — G4700 Insomnia, unspecified: Secondary | ICD-10-CM | POA: Diagnosis present

## 2018-01-11 DIAGNOSIS — F320 Major depressive disorder, single episode, mild: Secondary | ICD-10-CM | POA: Diagnosis not present

## 2018-01-11 DIAGNOSIS — E1142 Type 2 diabetes mellitus with diabetic polyneuropathy: Secondary | ICD-10-CM | POA: Diagnosis present

## 2018-01-11 DIAGNOSIS — I351 Nonrheumatic aortic (valve) insufficiency: Secondary | ICD-10-CM | POA: Diagnosis not present

## 2018-01-11 DIAGNOSIS — B957 Other staphylococcus as the cause of diseases classified elsewhere: Secondary | ICD-10-CM | POA: Diagnosis not present

## 2018-01-11 DIAGNOSIS — J9622 Acute and chronic respiratory failure with hypercapnia: Secondary | ICD-10-CM | POA: Diagnosis not present

## 2018-01-11 DIAGNOSIS — J439 Emphysema, unspecified: Secondary | ICD-10-CM | POA: Diagnosis not present

## 2018-01-11 DIAGNOSIS — R7881 Bacteremia: Secondary | ICD-10-CM | POA: Diagnosis not present

## 2018-01-11 DIAGNOSIS — B967 Clostridium perfringens [C. perfringens] as the cause of diseases classified elsewhere: Secondary | ICD-10-CM | POA: Diagnosis present

## 2018-01-11 DIAGNOSIS — R109 Unspecified abdominal pain: Secondary | ICD-10-CM

## 2018-01-11 DIAGNOSIS — Z87891 Personal history of nicotine dependence: Secondary | ICD-10-CM

## 2018-01-11 DIAGNOSIS — A419 Sepsis, unspecified organism: Secondary | ICD-10-CM | POA: Diagnosis not present

## 2018-01-11 DIAGNOSIS — Z9049 Acquired absence of other specified parts of digestive tract: Secondary | ICD-10-CM | POA: Diagnosis not present

## 2018-01-11 DIAGNOSIS — Z79899 Other long term (current) drug therapy: Secondary | ICD-10-CM

## 2018-01-11 DIAGNOSIS — D708 Other neutropenia: Secondary | ICD-10-CM | POA: Diagnosis not present

## 2018-01-11 DIAGNOSIS — R6520 Severe sepsis without septic shock: Secondary | ICD-10-CM

## 2018-01-11 DIAGNOSIS — E872 Acidosis: Secondary | ICD-10-CM | POA: Diagnosis present

## 2018-01-11 DIAGNOSIS — Z9981 Dependence on supplemental oxygen: Secondary | ICD-10-CM

## 2018-01-11 DIAGNOSIS — R0902 Hypoxemia: Secondary | ICD-10-CM | POA: Diagnosis not present

## 2018-01-11 DIAGNOSIS — I361 Nonrheumatic tricuspid (valve) insufficiency: Secondary | ICD-10-CM | POA: Diagnosis not present

## 2018-01-11 DIAGNOSIS — M069 Rheumatoid arthritis, unspecified: Secondary | ICD-10-CM | POA: Diagnosis not present

## 2018-01-11 DIAGNOSIS — R4182 Altered mental status, unspecified: Secondary | ICD-10-CM | POA: Diagnosis not present

## 2018-01-11 DIAGNOSIS — G4709 Other insomnia: Secondary | ICD-10-CM | POA: Diagnosis not present

## 2018-01-11 DIAGNOSIS — L409 Psoriasis, unspecified: Secondary | ICD-10-CM | POA: Diagnosis not present

## 2018-01-11 DIAGNOSIS — Z862 Personal history of diseases of the blood and blood-forming organs and certain disorders involving the immune mechanism: Secondary | ICD-10-CM | POA: Diagnosis not present

## 2018-01-11 DIAGNOSIS — K5909 Other constipation: Secondary | ICD-10-CM | POA: Diagnosis not present

## 2018-01-11 DIAGNOSIS — Z951 Presence of aortocoronary bypass graft: Secondary | ICD-10-CM

## 2018-01-11 DIAGNOSIS — D638 Anemia in other chronic diseases classified elsewhere: Secondary | ICD-10-CM | POA: Diagnosis present

## 2018-01-11 DIAGNOSIS — M05711 Rheumatoid arthritis with rheumatoid factor of right shoulder without organ or systems involvement: Secondary | ICD-10-CM | POA: Diagnosis not present

## 2018-01-11 DIAGNOSIS — B952 Enterococcus as the cause of diseases classified elsewhere: Secondary | ICD-10-CM | POA: Diagnosis not present

## 2018-01-11 DIAGNOSIS — J181 Lobar pneumonia, unspecified organism: Secondary | ICD-10-CM | POA: Diagnosis not present

## 2018-01-11 DIAGNOSIS — A4181 Sepsis due to Enterococcus: Secondary | ICD-10-CM | POA: Diagnosis not present

## 2018-01-11 DIAGNOSIS — J440 Chronic obstructive pulmonary disease with acute lower respiratory infection: Secondary | ICD-10-CM | POA: Diagnosis present

## 2018-01-11 DIAGNOSIS — M810 Age-related osteoporosis without current pathological fracture: Secondary | ICD-10-CM | POA: Diagnosis present

## 2018-01-11 DIAGNOSIS — J189 Pneumonia, unspecified organism: Secondary | ICD-10-CM | POA: Diagnosis not present

## 2018-01-11 DIAGNOSIS — I5032 Chronic diastolic (congestive) heart failure: Secondary | ICD-10-CM | POA: Diagnosis present

## 2018-01-11 DIAGNOSIS — Y95 Nosocomial condition: Secondary | ICD-10-CM | POA: Diagnosis not present

## 2018-01-11 DIAGNOSIS — G9340 Encephalopathy, unspecified: Secondary | ICD-10-CM | POA: Diagnosis not present

## 2018-01-11 DIAGNOSIS — D649 Anemia, unspecified: Secondary | ICD-10-CM | POA: Diagnosis not present

## 2018-01-11 DIAGNOSIS — R000 Tachycardia, unspecified: Secondary | ICD-10-CM | POA: Diagnosis not present

## 2018-01-11 DIAGNOSIS — D696 Thrombocytopenia, unspecified: Secondary | ICD-10-CM | POA: Diagnosis present

## 2018-01-11 DIAGNOSIS — J811 Chronic pulmonary edema: Secondary | ICD-10-CM | POA: Diagnosis not present

## 2018-01-11 DIAGNOSIS — I2510 Atherosclerotic heart disease of native coronary artery without angina pectoris: Secondary | ICD-10-CM | POA: Diagnosis present

## 2018-01-11 DIAGNOSIS — K5730 Diverticulosis of large intestine without perforation or abscess without bleeding: Secondary | ICD-10-CM | POA: Diagnosis not present

## 2018-01-11 DIAGNOSIS — D709 Neutropenia, unspecified: Secondary | ICD-10-CM | POA: Diagnosis not present

## 2018-01-11 DIAGNOSIS — E1143 Type 2 diabetes mellitus with diabetic autonomic (poly)neuropathy: Secondary | ICD-10-CM | POA: Diagnosis present

## 2018-01-11 DIAGNOSIS — R1084 Generalized abdominal pain: Secondary | ICD-10-CM | POA: Diagnosis not present

## 2018-01-11 DIAGNOSIS — D61818 Other pancytopenia: Secondary | ICD-10-CM | POA: Diagnosis not present

## 2018-01-11 DIAGNOSIS — Z7982 Long term (current) use of aspirin: Secondary | ICD-10-CM

## 2018-01-11 DIAGNOSIS — R05 Cough: Secondary | ICD-10-CM | POA: Diagnosis not present

## 2018-01-11 DIAGNOSIS — J9811 Atelectasis: Secondary | ICD-10-CM | POA: Diagnosis not present

## 2018-01-11 DIAGNOSIS — J9611 Chronic respiratory failure with hypoxia: Secondary | ICD-10-CM | POA: Diagnosis not present

## 2018-01-11 DIAGNOSIS — J431 Panlobular emphysema: Secondary | ICD-10-CM | POA: Diagnosis not present

## 2018-01-11 DIAGNOSIS — I4891 Unspecified atrial fibrillation: Secondary | ICD-10-CM | POA: Diagnosis not present

## 2018-01-11 DIAGNOSIS — J9690 Respiratory failure, unspecified, unspecified whether with hypoxia or hypercapnia: Secondary | ICD-10-CM

## 2018-01-11 DIAGNOSIS — A4102 Sepsis due to Methicillin resistant Staphylococcus aureus: Secondary | ICD-10-CM | POA: Diagnosis not present

## 2018-01-11 DIAGNOSIS — E1140 Type 2 diabetes mellitus with diabetic neuropathy, unspecified: Secondary | ICD-10-CM | POA: Diagnosis not present

## 2018-01-11 DIAGNOSIS — E118 Type 2 diabetes mellitus with unspecified complications: Secondary | ICD-10-CM | POA: Diagnosis not present

## 2018-01-11 DIAGNOSIS — B9689 Other specified bacterial agents as the cause of diseases classified elsewhere: Secondary | ICD-10-CM

## 2018-01-11 DIAGNOSIS — Z7952 Long term (current) use of systemic steroids: Secondary | ICD-10-CM | POA: Diagnosis not present

## 2018-01-11 DIAGNOSIS — R69 Illness, unspecified: Secondary | ICD-10-CM | POA: Diagnosis not present

## 2018-01-11 DIAGNOSIS — R197 Diarrhea, unspecified: Secondary | ICD-10-CM | POA: Diagnosis not present

## 2018-01-11 DIAGNOSIS — F329 Major depressive disorder, single episode, unspecified: Secondary | ICD-10-CM | POA: Diagnosis present

## 2018-01-11 DIAGNOSIS — R58 Hemorrhage, not elsewhere classified: Secondary | ICD-10-CM | POA: Diagnosis not present

## 2018-01-11 LAB — CBC WITH DIFFERENTIAL/PLATELET
BASOS PCT: 1 %
Basophils Absolute: 0.1 10*3/uL (ref 0.0–0.1)
EOS ABS: 0 10*3/uL (ref 0.0–0.7)
EOS PCT: 0 %
HCT: 45.1 % (ref 39.0–52.0)
Hemoglobin: 14.8 g/dL (ref 13.0–17.0)
LYMPHS ABS: 2.8 10*3/uL (ref 0.7–4.0)
Lymphocytes Relative: 49 %
MCH: 27.4 pg (ref 26.0–34.0)
MCHC: 32.8 g/dL (ref 30.0–36.0)
MCV: 83.4 fL (ref 78.0–100.0)
MONO ABS: 1.2 10*3/uL — AB (ref 0.1–1.0)
Monocytes Relative: 21 %
NEUTROS ABS: 1.7 10*3/uL (ref 1.7–7.7)
Neutrophils Relative %: 29 %
PLATELETS: 218 10*3/uL (ref 150–400)
RBC: 5.41 MIL/uL (ref 4.22–5.81)
RDW: 15.9 % — AB (ref 11.5–15.5)
WBC: 5.8 10*3/uL (ref 4.0–10.5)

## 2018-01-11 LAB — COMPREHENSIVE METABOLIC PANEL
ALK PHOS: 119 U/L (ref 38–126)
ALT: 23 U/L (ref 17–63)
AST: 32 U/L (ref 15–41)
Albumin: 3.5 g/dL (ref 3.5–5.0)
Anion gap: 15 (ref 5–15)
BUN: 15 mg/dL (ref 6–20)
CALCIUM: 9.3 mg/dL (ref 8.9–10.3)
CHLORIDE: 97 mmol/L — AB (ref 101–111)
CO2: 24 mmol/L (ref 22–32)
CREATININE: 0.92 mg/dL (ref 0.61–1.24)
GFR calc non Af Amer: >60 mL/min (ref >60)
GLUCOSE: 185 mg/dL — AB (ref 65–99)
Potassium: 3.6 mmol/L (ref 3.5–5.1)
SODIUM: 136 mmol/L (ref 135–145)
Total Bilirubin: 1 mg/dL (ref 0.3–1.2)
Total Protein: 7.3 g/dL (ref 6.5–8.1)

## 2018-01-11 LAB — BLOOD CULTURE ID PANEL (REFLEXED)
ACINETOBACTER BAUMANNII: NOT DETECTED
Acinetobacter baumannii: NOT DETECTED
CANDIDA ALBICANS: NOT DETECTED
CANDIDA GLABRATA: NOT DETECTED
CANDIDA KRUSEI: NOT DETECTED
CANDIDA PARAPSILOSIS: NOT DETECTED
Candida albicans: NOT DETECTED
Candida glabrata: NOT DETECTED
Candida krusei: NOT DETECTED
Candida parapsilosis: NOT DETECTED
Candida tropicalis: NOT DETECTED
Candida tropicalis: NOT DETECTED
ENTEROBACTER CLOACAE COMPLEX: NOT DETECTED
ENTEROBACTERIACEAE SPECIES: NOT DETECTED
ENTEROCOCCUS SPECIES: NOT DETECTED
ENTEROCOCCUS SPECIES: NOT DETECTED
ESCHERICHIA COLI: NOT DETECTED
Enterobacter cloacae complex: NOT DETECTED
Enterobacteriaceae species: NOT DETECTED
Escherichia coli: NOT DETECTED
HAEMOPHILUS INFLUENZAE: NOT DETECTED
Haemophilus influenzae: NOT DETECTED
KLEBSIELLA OXYTOCA: NOT DETECTED
Klebsiella oxytoca: NOT DETECTED
Klebsiella pneumoniae: NOT DETECTED
Klebsiella pneumoniae: NOT DETECTED
LISTERIA MONOCYTOGENES: NOT DETECTED
LISTERIA MONOCYTOGENES: NOT DETECTED
METHICILLIN RESISTANCE: DETECTED — AB
NEISSERIA MENINGITIDIS: NOT DETECTED
Neisseria meningitidis: NOT DETECTED
PSEUDOMONAS AERUGINOSA: NOT DETECTED
Proteus species: NOT DETECTED
Proteus species: NOT DETECTED
Pseudomonas aeruginosa: NOT DETECTED
SERRATIA MARCESCENS: NOT DETECTED
STAPHYLOCOCCUS AUREUS BCID: NOT DETECTED
STREPTOCOCCUS PNEUMONIAE: NOT DETECTED
STREPTOCOCCUS PNEUMONIAE: NOT DETECTED
STREPTOCOCCUS PYOGENES: NOT DETECTED
STREPTOCOCCUS PYOGENES: NOT DETECTED
STREPTOCOCCUS SPECIES: NOT DETECTED
Serratia marcescens: NOT DETECTED
Staphylococcus aureus (BCID): NOT DETECTED
Staphylococcus species: NOT DETECTED
Staphylococcus species: DETECTED — AB
Streptococcus agalactiae: NOT DETECTED
Streptococcus agalactiae: NOT DETECTED
Streptococcus species: NOT DETECTED

## 2018-01-11 LAB — URINALYSIS, ROUTINE W REFLEX MICROSCOPIC
Bilirubin Urine: NEGATIVE
GLUCOSE, UA: NEGATIVE mg/dL
HGB URINE DIPSTICK: NEGATIVE
Ketones, ur: NEGATIVE mg/dL
LEUKOCYTES UA: NEGATIVE
Nitrite: NEGATIVE
PH: 5 (ref 5.0–8.0)
Protein, ur: NEGATIVE mg/dL
Specific Gravity, Urine: 1.013 (ref 1.005–1.030)

## 2018-01-11 LAB — GLUCOSE, CAPILLARY
GLUCOSE-CAPILLARY: 110 mg/dL — AB (ref 65–99)
GLUCOSE-CAPILLARY: 167 mg/dL — AB (ref 65–99)
Glucose-Capillary: 109 mg/dL — ABNORMAL HIGH (ref 65–99)
Glucose-Capillary: 149 mg/dL — ABNORMAL HIGH (ref 65–99)
Glucose-Capillary: 151 mg/dL — ABNORMAL HIGH (ref 65–99)
Glucose-Capillary: 151 mg/dL — ABNORMAL HIGH (ref 65–99)

## 2018-01-11 LAB — I-STAT VENOUS BLOOD GAS, ED
Acid-base deficit: 6 mmol/L — ABNORMAL HIGH (ref 0.0–2.0)
Bicarbonate: 20.7 mmol/L (ref 20.0–28.0)
O2 SAT: 34 %
PCO2 VEN: 45 mmHg (ref 44.0–60.0)
PO2 VEN: 24 mmHg — AB (ref 32.0–45.0)
TCO2: 22 mmol/L (ref 22–32)
pH, Ven: 7.271 (ref 7.250–7.430)

## 2018-01-11 LAB — PROTIME-INR
INR: 1.02
Prothrombin Time: 13.3 seconds (ref 11.4–15.2)

## 2018-01-11 LAB — LACTIC ACID, PLASMA
LACTIC ACID, VENOUS: 2.5 mmol/L — AB (ref 0.5–1.9)
LACTIC ACID, VENOUS: 2.4 mmol/L — AB (ref 0.5–1.9)
Lactic Acid, Venous: 3 mmol/L (ref 0.5–1.9)
Lactic Acid, Venous: 3 mmol/L (ref 0.5–1.9)
Lactic Acid, Venous: 2.8 mmol/L (ref 0.5–1.9)

## 2018-01-11 LAB — I-STAT CHEM 8, ED
BUN: 17 mg/dL (ref 6–20)
CALCIUM ION: 1.09 mmol/L — AB (ref 1.15–1.40)
Chloride: 97 mmol/L — ABNORMAL LOW (ref 101–111)
Creatinine, Ser: 0.7 mg/dL (ref 0.61–1.24)
GLUCOSE: 185 mg/dL — AB (ref 65–99)
HCT: 45 % (ref 39.0–52.0)
HEMOGLOBIN: 15.3 g/dL (ref 13.0–17.0)
Potassium: 3.6 mmol/L (ref 3.5–5.1)
SODIUM: 136 mmol/L (ref 135–145)
TCO2: 27 mmol/L (ref 22–32)

## 2018-01-11 LAB — I-STAT CG4 LACTIC ACID, ED
LACTIC ACID, VENOUS: 8.08 mmol/L — AB (ref 0.5–1.9)
Lactic Acid, Venous: 3.64 mmol/L (ref 0.5–1.9)
Lactic Acid, Venous: 6.97 mmol/L (ref 0.5–1.9)

## 2018-01-11 LAB — CORTISOL: Cortisol, Plasma: 8.2 ug/dL

## 2018-01-11 LAB — PROCALCITONIN: Procalcitonin: 1.44 ng/mL

## 2018-01-11 MED ORDER — SODIUM CHLORIDE 0.9 % IV SOLN
400.0000 mg | Freq: Once | INTRAVENOUS | Status: AC
  Administered 2018-01-11: 400 mg via INTRAVENOUS
  Filled 2018-01-11: qty 4

## 2018-01-11 MED ORDER — SODIUM CHLORIDE 0.9 % IV SOLN: 250.0000 mL | INTRAVENOUS | Status: DC | PRN

## 2018-01-11 MED ORDER — ARFORMOTEROL TARTRATE 15 MCG/2ML IN NEBU
15.0000 ug | INHALATION_SOLUTION | Freq: Two times a day (BID) | RESPIRATORY_TRACT | Status: DC
  Administered 2018-01-11 – 2018-01-24 (×24): 15 ug via RESPIRATORY_TRACT
  Filled 2018-01-11 (×30): qty 2

## 2018-01-11 MED ORDER — VANCOMYCIN HCL IN DEXTROSE 750-5 MG/150ML-% IV SOLN
750.0000 mg | Freq: Once | INTRAVENOUS | Status: AC
  Administered 2018-01-11: 750 mg via INTRAVENOUS
  Filled 2018-01-11: qty 150

## 2018-01-11 MED ORDER — ORAL CARE MOUTH RINSE
15.0000 mL | Freq: Two times a day (BID) | OROMUCOSAL | Status: DC
  Administered 2018-01-11 – 2018-01-24 (×19): 15 mL via OROMUCOSAL

## 2018-01-11 MED ORDER — IPRATROPIUM-ALBUTEROL 0.5-2.5 (3) MG/3ML IN SOLN
3.0000 mL | Freq: Four times a day (QID) | RESPIRATORY_TRACT | Status: DC
  Administered 2018-01-11: 3 mL via RESPIRATORY_TRACT
  Filled 2018-01-11 (×2): qty 3

## 2018-01-11 MED ORDER — PIPERACILLIN-TAZOBACTAM 3.375 G IVPB 30 MIN
3.3750 g | Freq: Once | INTRAVENOUS | Status: AC
  Administered 2018-01-11: 3.375 g via INTRAVENOUS
  Filled 2018-01-11: qty 50

## 2018-01-11 MED ORDER — METOPROLOL TARTRATE 5 MG/5ML IV SOLN
2.5000 mg | INTRAVENOUS | Status: DC | PRN
  Administered 2018-01-11: 5 mg via INTRAVENOUS
  Filled 2018-01-11: qty 5

## 2018-01-11 MED ORDER — CHLORHEXIDINE GLUCONATE CLOTH 2 % EX PADS
6.0000 | MEDICATED_PAD | Freq: Every day | CUTANEOUS | Status: DC
  Administered 2018-01-11 – 2018-01-13 (×3): 6 via TOPICAL

## 2018-01-11 MED ORDER — SODIUM CHLORIDE 0.9 % IV BOLUS
500.0000 mL | Freq: Once | INTRAVENOUS | Status: AC
  Administered 2018-01-11: 500 mL via INTRAVENOUS

## 2018-01-11 MED ORDER — MUPIROCIN 2 % EX OINT
1.0000 "application " | TOPICAL_OINTMENT | Freq: Two times a day (BID) | CUTANEOUS | Status: DC
  Administered 2018-01-11 – 2018-01-13 (×5): 1 via NASAL
  Filled 2018-01-11 (×3): qty 22

## 2018-01-11 MED ORDER — PHENYLEPHRINE HCL-NACL 10-0.9 MG/250ML-% IV SOLN
0.0000 ug/min | INTRAVENOUS | Status: DC
  Filled 2018-01-11 (×2): qty 250

## 2018-01-11 MED ORDER — IOHEXOL 300 MG/ML  SOLN
100.0000 mL | Freq: Once | INTRAMUSCULAR | Status: AC | PRN
  Administered 2018-01-11: 100 mL via INTRAVENOUS

## 2018-01-11 MED ORDER — PIPERACILLIN-TAZOBACTAM 3.375 G IVPB
3.3750 g | Freq: Three times a day (TID) | INTRAVENOUS | Status: DC
  Administered 2018-01-12 – 2018-01-13 (×4): 3.375 g via INTRAVENOUS
  Filled 2018-01-11 (×5): qty 50

## 2018-01-11 MED ORDER — SODIUM CHLORIDE 0.9 % IV BOLUS (SEPSIS)
1000.0000 mL | Freq: Once | INTRAVENOUS | Status: AC
  Administered 2018-01-11: 1000 mL via INTRAVENOUS

## 2018-01-11 MED ORDER — ONDANSETRON HCL 4 MG/2ML IJ SOLN: 4.0000 mg | Freq: Four times a day (QID) | INTRAMUSCULAR | Status: DC | PRN

## 2018-01-11 MED ORDER — ACETAMINOPHEN 650 MG RE SUPP
650.0000 mg | Freq: Once | RECTAL | Status: AC
  Administered 2018-01-11: 650 mg via RECTAL
  Filled 2018-01-11: qty 1

## 2018-01-11 MED ORDER — HYDROCORTISONE NA SUCCINATE PF 100 MG IJ SOLR
50.0000 mg | Freq: Four times a day (QID) | INTRAMUSCULAR | Status: DC
  Administered 2018-01-11 – 2018-01-12 (×5): 50 mg via INTRAVENOUS
  Filled 2018-01-11 (×5): qty 2

## 2018-01-11 MED ORDER — SODIUM CHLORIDE 0.9 % IV SOLN
1250.0000 mg | Freq: Two times a day (BID) | INTRAVENOUS | Status: DC
  Administered 2018-01-11 – 2018-01-13 (×5): 1250 mg via INTRAVENOUS
  Filled 2018-01-11 (×5): qty 1250

## 2018-01-11 MED ORDER — SODIUM CHLORIDE 0.9 % IV BOLUS
1000.0000 mL | Freq: Once | INTRAVENOUS | Status: AC
  Administered 2018-01-11: 1000 mL via INTRAVENOUS

## 2018-01-11 MED ORDER — INSULIN ASPART 100 UNIT/ML ~~LOC~~ SOLN
2.0000 [IU] | SUBCUTANEOUS | Status: DC
  Administered 2018-01-11 (×3): 4 [IU] via SUBCUTANEOUS
  Administered 2018-01-11: 2 [IU] via SUBCUTANEOUS
  Administered 2018-01-12: 6 [IU] via SUBCUTANEOUS
  Administered 2018-01-12 (×3): 2 [IU] via SUBCUTANEOUS
  Administered 2018-01-12 (×2): 4 [IU] via SUBCUTANEOUS
  Administered 2018-01-13: 2 [IU] via SUBCUTANEOUS

## 2018-01-11 MED ORDER — PHENYLEPHRINE HCL 10 MG/ML IJ SOLN
0.0000 ug/min | Freq: Once | INTRAMUSCULAR | Status: DC
  Filled 2018-01-11: qty 1

## 2018-01-11 MED ORDER — HEPARIN SODIUM (PORCINE) 5000 UNIT/ML IJ SOLN
5000.0000 [IU] | Freq: Three times a day (TID) | INTRAMUSCULAR | Status: DC
  Administered 2018-01-11 – 2018-01-17 (×17): 5000 [IU] via SUBCUTANEOUS
  Filled 2018-01-11 (×22): qty 1

## 2018-01-11 MED ORDER — SODIUM CHLORIDE 0.9 % IV SOLN
INTRAVENOUS | Status: DC
  Administered 2018-01-11 – 2018-01-12 (×3): via INTRAVENOUS

## 2018-01-11 MED ORDER — IPRATROPIUM-ALBUTEROL 0.5-2.5 (3) MG/3ML IN SOLN
3.0000 mL | Freq: Two times a day (BID) | RESPIRATORY_TRACT | Status: DC
Start: 2018-01-12 — End: 2018-01-24
  Administered 2018-01-12 – 2018-01-24 (×23): 3 mL via RESPIRATORY_TRACT
  Filled 2018-01-11 (×28): qty 3

## 2018-01-11 MED ORDER — VANCOMYCIN HCL IN DEXTROSE 1-5 GM/200ML-% IV SOLN
1000.0000 mg | Freq: Once | INTRAVENOUS | Status: AC
  Administered 2018-01-11: 1000 mg via INTRAVENOUS
  Filled 2018-01-11: qty 200

## 2018-01-11 MED ORDER — WHITE PETROLATUM EX OINT
TOPICAL_OINTMENT | CUTANEOUS | Status: AC
  Administered 2018-01-11: 0.2
  Filled 2018-01-11: qty 28.35

## 2018-01-11 MED ORDER — POTASSIUM CHLORIDE 10 MEQ/100ML IV SOLN
10.0000 meq | INTRAVENOUS | Status: AC
  Administered 2018-01-11 (×4): 10 meq via INTRAVENOUS
  Filled 2018-01-11 (×4): qty 100

## 2018-01-11 MED ORDER — ACETAMINOPHEN 10 MG/ML IV SOLN
1000.0000 mg | Freq: Four times a day (QID) | INTRAVENOUS | Status: AC
  Administered 2018-01-11 (×2): 1000 mg via INTRAVENOUS
  Filled 2018-01-11 (×4): qty 100

## 2018-01-11 MED ORDER — PHENYLEPHRINE HCL 10 MG/ML IJ SOLN
0.0000 ug/min | INTRAMUSCULAR | Status: DC
  Administered 2018-01-11: 20 ug/min via INTRAVENOUS
  Filled 2018-01-11: qty 1

## 2018-01-11 MED ORDER — DILTIAZEM HCL 30 MG PO TABS
30.0000 mg | ORAL_TABLET | Freq: Four times a day (QID) | ORAL | Status: DC
  Administered 2018-01-11 – 2018-01-13 (×7): 30 mg via ORAL
  Filled 2018-01-11 (×10): qty 1

## 2018-01-11 MED ORDER — BUDESONIDE 0.5 MG/2ML IN SUSP
0.5000 mg | Freq: Two times a day (BID) | RESPIRATORY_TRACT | Status: DC
  Administered 2018-01-11 – 2018-01-24 (×23): 0.5 mg via RESPIRATORY_TRACT
  Filled 2018-01-11 (×31): qty 2

## 2018-01-11 NOTE — Progress Notes (Signed)
CRITICAL VALUE ALERT  Critical Value:  Lactic Acid of 3.0  Date & Time Notied:  01/11/18 at 1137am  Provider Notified: Dr. Molli Knock  Orders Received/Actions taken: Acknowledgment with no verbal orders received at this time

## 2018-01-11 NOTE — Progress Notes (Signed)
Pt temp 102.8 F rectal. RN aware, pt on cooling blanket.

## 2018-01-11 NOTE — H&P (Addendum)
PULMONARY / CRITICAL CARE MEDICINE   Name: Joseph [name] MRN: [id number] DOB: 1951/02/01    ADMISSION DATE:  01/11/2018 CONSULTATION DATE:  01/11/2018  REFERRING MD:  Dr. Betsey Holiday   CHIEF COMPLAINT:  Febrile   HISTORY OF PRESENT ILLNESS:   67 year old male with CAD, COPD/Bullous Emphysema, GERD, gastroparesis with recurrent aspiration, rheumatoid arthritis (on prednisone), Leukopenia, DM   Admission 4/29-5/3 with HCAP/Infected pulmonary bulla. Discharged on Augmentin then developed severe diarrhea. Admitted 5/4-5/17 with neutropenia. Fevers (tmax 102.8) with CXR showing left upper lobe thick-walled cavitary lesion with increased size and surrounding airspace disease. [facility] consulted and started trial of Granix for 3 days.    Presented 5/19 with AMS and fever. Upon arrival Temp 105. Patient ws recently discharged 5/17 and then followed up at [facility]. Wife reports he was doing well until this afternoon when he seemed to be confused as well as having diarrhea. Upon arrival patient is hypotensive, tachycardiac. LA 3.64, received 3L of normal saline, repeat 8. PCCM asked to admit.   PAST MEDICAL HISTORY :  He  has a past medical history of CAD (coronary artery disease), Cataracts, bilateral, Complication of anesthesia, COPD (chronic obstructive pulmonary disease) ([place]), Diabetes mellitus, Esophageal dysmotility, Fatty liver (10/11/09), Gastroesophageal reflux disease, Gastroparesis, Hiatal hernia, Hypertension, Neuropathy, Onychomycosis, Osteoporosis, Oxygen dependent, Peripheral polyneuropathy, Psoriasis, Pulmonary nodules, Rheumatoid arthritis ([place]), and Septic shock ([place]) (07/2017).  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  No Known Allergies  No current facility-administered medications on file prior to encounter.    Current Outpatient Medications on File Prior to Encounter  Medication Sig  . albuterol (PROAIR HFA) 108 (90 Base) MCG/ACT inhaler Inhale 2 puffs into the lungs every 4 (four) hours as needed for wheezing or shortness of breath.  . Ascorbic Acid (VITAMIN C) 500 MG tablet Take 500 mg by mouth daily.    Marland Kitchen aspirin EC 81 MG tablet Take 81 mg by mouth daily.  Marland Kitchen atorvastatin (LIPITOR) 80 MG tablet TAKE 1 TABLET (30m) BY MOUTH DAILY (Patient taking differently: Take 80 mg by mouth at bedtime. )  . B Complex-C (SUPER B COMPLEX PO) Take 1 tablet by mouth daily.   .Carin HockIron (PERFECT IRON PO) Take 50 mg by mouth daily.   . clobetasol cream (TEMOVATE) 03.22% Apply 1 application topically 2 (two) times daily as needed (rash).   .Marland Kitchendextromethorphan-guaiFENesin (MUCINEX DM) 30-600 MG 12hr tablet Take 1 tablet by mouth at bedtime.  . folic acid (FOLVITE) 8025MCG tablet Take 800 mcg by mouth daily.  . furosemide (LASIX) 20 MG tablet Take 20-40 mg by mouth See admin instructions. Take 2 tablets every morning and take 1 tablet at 3 pm  . hydrocortisone cream 1 % Apply 1 application topically 3 (three) times daily as needed for itching.  .Marland Kitchenipratropium-albuterol (DUONEB) 0.5-2.5 (3) MG/3ML SOLN Take 3 mLs by nebulization every 4 (four) hours as needed (shortness of breath or wheezing).  .Marland Kitchenloperamide (IMODIUM) 2 MG capsule Take 1 capsule (2 mg total) by mouth as needed for diarrhea or loose stools.  . Loteprednol Etabonate (LOTEMAX) 0.5 % GEL Place 1 drop into both eyes daily.   . Melatonin 1 MG CAPS Take 4  mg by mouth as needed (Sleep).  . Multiple Vitamins-Minerals (VITEYES AREDS ADVANCED PO) Take 1 tablet by mouth daily.  . nitroGLYCERIN (NITROSTAT) 0.4 MG SL tablet  Place 1 tablet (0.4 mg total) under the tongue every 5 (five) minutes as needed for chest pain (up to 3 doses for severe chest pain. If no relief after 3rd dose, proceed to the ED for an evaluation).  Marland Kitchen OVER THE COUNTER MEDICATION Apply topically as needed. T-Gel Shampoo  . oxyCODONE-acetaminophen (PERCOCET) 7.5-325 MG per tablet Take 1 tablet by mouth every 6 (six) hours as needed for moderate pain.   . pantoprazole (PROTONIX) 40 MG tablet Take 1 tablet (40 mg total) by mouth 2 (two) times daily.  . polyethylene glycol (MIRALAX / GLYCOLAX) packet Take 17 g by mouth daily.  . potassium chloride (K-DUR,KLOR-CON) 10 MEQ tablet Take 20-40 mEq by mouth See admin instructions. Take 4 tablets every morning and take 2 tablets every evening  . predniSONE (DELTASONE) 20 MG tablet Take 15 mg by mouth daily with breakfast.   . pregabalin (LYRICA) 75 MG capsule Take 75 mg by mouth 2 (two) times daily.  . sodium chloride (OCEAN) 0.65 % SOLN nasal spray Place 1 spray into both nostrils as needed for congestion.  . TRELEGY ELLIPTA 100-62.5-25 MCG/INH AEPB Inhale 1 puff into the lungs daily.  . OXYGEN Inhale 3 L into the lungs continuous.    FAMILY HISTORY:  His indicated that his mother is deceased. He indicated that his father is deceased. He indicated that his maternal grandmother is deceased. He indicated that his maternal grandfather is deceased. He indicated that his paternal grandmother is deceased. He indicated that his paternal grandfather is deceased. He indicated that the status of his neg hx is unknown.   SOCIAL HISTORY: He  reports that he quit smoking about 12 years ago. He has a 40.00 pack-year smoking history. He quit smokeless tobacco use about 11 years ago. He reports that he does not drink alcohol or use drugs.  REVIEW OF SYSTEMS:   All negative; except for those that are bolded, which indicate positives.  Constitutional: weight loss, weight gain, night sweats, fevers, chills, fatigue,  weakness.  HEENT: headaches, sore throat, sneezing, nasal congestion, post nasal drip, difficulty swallowing, tooth/dental problems, visual complaints, visual changes, ear aches. Neuro: difficulty with speech, weakness, numbness, ataxia. CV:  chest pain, orthopnea, PND, swelling in lower extremities, dizziness, palpitations, syncope.  Resp: cough, hemoptysis, dyspnea, wheezing. GI: heartburn, indigestion, abdominal pain, nausea, vomiting, diarrhea, constipation, change in bowel habits, loss of appetite, hematemesis, melena, hematochezia.  GU: dysuria, change in color of urine, urgency or frequency, flank pain, hematuria. MSK: joint pain or swelling, decreased range of motion. Psych: change in mood or affect, depression, anxiety, suicidal ideations, homicidal ideations. Skin: rash, itching, bruising.   SUBJECTIVE:   VITAL SIGNS: BP 100/65   Pulse (!) 126   Temp (!) 105 F (40.6 C) (Rectal)   Resp (!) 26   SpO2 98%   HEMODYNAMICS:    VENTILATOR SETTINGS:    INTAKE / OUTPUT: No intake/output data recorded.  PHYSICAL EXAMINATION: General:  Adult male, moderate distress Neuro:  Lethargic, follows commands  HEENT:  Dry MM  Cardiovascular:  Irregular, no MRG Lungs:  Diminished breath sounds, no wheeze/crackles  Abdomen:  Non-distended, active bowel sounds  Musculoskeletal:  -edema  Skin:  Warm, dry   LABS:  BMET Recent Labs  Lab 01/08/18 0404 01/09/18 0505 01/11/18 0014 01/11/18 0050  NA 139 138 136 136  K 3.8 3.9 3.6 3.6  CL 102 101 97* 97*  CO2 _0 --   BUN _1 CREATININE 0.54* 0.65 0.92 0.70  GLUCOSE 91 91 185* 185*    Electrolytes Recent Labs  Lab 01/07/18 0341 01/08/18 0404 01/09/18 0505 01/11/18 0014  CALCIUM 9.0 9.3 8.9 9.3  MG 1.6* 1.5* 1.8  --     CBC Recent Labs  Lab 01/08/18 0404 01/09/18 0505 01/11/18 0014 01/11/18 0050  WBC 6.1 7.2 5.8  --   HGB 11.2* 11.0* 14.8 15.3  HCT 34.7* 34.2* 45.1 45.0  PLT 281 244 218  --      Coag's Recent Labs  Lab 01/11/18 0014  INR 1.02    Sepsis Markers Recent Labs  Lab 01/11/18 0056 01/11/18 0221 01/11/18 0245  LATICACIDVEN 3.64* 8.08* 6.97*    ABG No results for input(s): PHART, PCO2ART, PO2ART in the last 168 hours.  Liver Enzymes Recent Labs  Lab 01/05/18 0702 01/07/18 0341 01/11/18 0014  AST 22 17 32  ALT 18 16* 23  ALKPHOS 57 60 119  BILITOT 0.7 0.8 1.0  ALBUMIN 2.7* 2.6* 3.5    Cardiac Enzymes No results for input(s): TROPONINI, PROBNP in the last 168 hours.  Glucose Recent Labs  Lab 01/08/18 1202 01/08/18 1649 01/08/18 2010 01/09/18 0019 01/09/18 0452 01/09/18 0757  GLUCAP 173* 184* 173* 106* 88 84    Imaging Dg Chest Port 1 View  Result Date: 01/11/2018 CLINICAL DATA:  Sepsis, fever and weakness.  Dyspnea x1 day. EXAM: PORTABLE CHEST 1 VIEW COMPARISON:  Chest CT 12/22/2017, CXR 01/02/2018 FINDINGS: Stable cardiomegaly with post CABG change and aortic atherosclerosis. Redemonstration of left apical bullae, the largest is partially filled with fluid and thick-walled compatible with an infected bulla. This is slightly smaller in appearance than on prior more cavitary in appearance than recent comparison chest radiograph. Subsegmental scarring is seen in the right upper lobe and mid lung. Probable trace right effusion. No acute osseous abnormality. IMPRESSION: 1. Cardiomegaly with aortic atherosclerosis and post CABG change. 2. Thick-walled left upper lobe bulla compatible with an infected bulla is redemonstrated though slightly more cavitary in appearance and less opacified than on prior comparison. This would suggest some partial clearing of fluid within. Electronically Signed   By: Ashley Royalty M.D.   On: 01/11/2018 00:46    STUDIES:  CXR 5/19 > 1. Cardiomegaly with aortic atherosclerosis and post CABG change. 2. Thick-walled left upper lobe bulla compatible with an infected bulla is redemonstrated though slightly more cavitary in  appearance and less opacified than on prior comparison. This would suggest some partial clearing of fluid within. CT Chest 5/19 >  Multiple cavitations noted at the left lung apex, with new air-fluid levels, concerning for atypical infection superimposed on chronic bullous disease. 2. Mild interstitial prominence may reflect mild interstitial edema. 3. Diffuse coronary artery calcifications seen. 4. Tiny hiatal hernia noted.   CULTURES: Blood 5/19 >> Urine 5/19 >>  ANTIBIOTICS: Vancomycin 5/19 >> Zosyn 5/19 >>   SIGNIFICANT EVENTS: 5/19 > Presents to ED   LINES/TUBES: PIV   DISCUSSION: 67 year old male with RA and immunocompromised presents to ED in Septic Shock. Tmax 105.   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 Multiple cavitations noted at the left lung apex, with new air-fluid levels, concerning for atypical infection superimposed on chronic bullous disease. 2. Mild interstitial prominence may reflect mild interstitial edema. 3. Diffuse coronary artery  calcifications seen. 4. Tiny hiatal hernia noted. H/O Bullous Emphysema/COPD  P:   Wean Supplemental Oxygen to maintain saturation >92  Duoneb/Brovana/Pulmicort Pulmonary Hygiene  Trend CXR   Septic Shock  Chronic Diastolic HF (EF 79-72, Q2SU) A.Fib with RVR  P:  Cardiac Monitoring  Maintain MAP >65  Anion Gap Metabolic Acidosis with Lactic Acidosis  P:   Trend BMP  Replace electrolytes as indicated  Trend LA   GERD Diarrhea  Nausea  H/O Gastroparesis with recurrent aspiration  P:   NPO Zofran PRN    RA on chronic steroids  Immunocompromised  Thrombocytopenia Persistent Neutropenia  P:  Trend CBC Consult Hematology for Recommendations > Patient sees [facility] outpatient and was going to undergo Bone Marrow Biopsy 5/20 for further work-up  Febrile (Tmax 105)  Recent PNA with infected Bulla  -U/A Negative  P:   Trend WBC and Fever Curve Trend LA and PCT   Follow Culture Data Continue Zosyn and Vancomycin   DM   P:   Trend Glucose  SSI  Cortisol pending    FAMILY  - Updates: Family updated at bedside   - Inter-disciplinary family meet or Palliative Care meeting due by: 01/18/2018    Hayden Pedro, AGACNP-BC [facility]  Pgr: [phone]  PCCM Pgr: [phone]

## 2018-01-11 NOTE — Progress Notes (Signed)
PHARMACY - PHYSICIAN COMMUNICATION CRITICAL VALUE ALERT - BLOOD CULTURE IDENTIFICATION (BCID)  Joseph [name] is an 67 y.o. male who presented to [facility] on 01/11/2018 with a chief complaint of fever.   Assessment:  67 year old male admitted with fever/possible pneumonia.  Noted recent admissions for pneumonia in April and neutropenia in May. Now with gram positive rods in anaerobic bottle of 1 set of blood cultures. TMax-105 and  WBC 5.8.    Name of physician (or Provider) Contacted: Molli Knock  Current antibiotics: Vanc/Zosyn   Changes to prescribed antibiotics recommended:  Would not make any antibiotic changes based on the blood culture information.   Results for orders placed or performed during the hospital encounter of 01/11/18  Blood Culture ID Panel (Reflexed) (Collected: 01/11/2018 12:20 AM)  Result Value Ref Range   Enterococcus species NOT DETECTED NOT DETECTED   Listeria monocytogenes NOT DETECTED NOT DETECTED   Staphylococcus species NOT DETECTED NOT DETECTED   Staphylococcus aureus NOT DETECTED NOT DETECTED   Streptococcus species NOT DETECTED NOT DETECTED   Streptococcus agalactiae NOT DETECTED NOT DETECTED   Streptococcus pneumoniae NOT DETECTED NOT DETECTED   Streptococcus pyogenes NOT DETECTED NOT DETECTED   Acinetobacter baumannii NOT DETECTED NOT DETECTED   Enterobacteriaceae species NOT DETECTED NOT DETECTED   Enterobacter cloacae complex NOT DETECTED NOT DETECTED   Escherichia coli NOT DETECTED NOT DETECTED   Klebsiella oxytoca NOT DETECTED NOT DETECTED   Klebsiella pneumoniae NOT DETECTED NOT DETECTED   Proteus species NOT DETECTED NOT DETECTED   Serratia marcescens NOT DETECTED NOT DETECTED   Haemophilus influenzae NOT DETECTED NOT DETECTED   Neisseria meningitidis NOT DETECTED NOT DETECTED   Pseudomonas aeruginosa NOT DETECTED NOT DETECTED   Candida albicans NOT DETECTED NOT DETECTED   Candida glabrata NOT DETECTED NOT DETECTED   Candida krusei NOT  DETECTED NOT DETECTED   Candida parapsilosis NOT DETECTED NOT DETECTED   Candida tropicalis NOT DETECTED NOT DETECTED   Sharin Mons, PharmD, BCPS PGY2 Infectious Diseases Pharmacy Resident Pager: [phone]  01/11/2018  3:26 PM

## 2018-01-11 NOTE — H&P (Signed)
PULMONARY / CRITICAL CARE MEDICINE   Name: Joseph [name] MRN: [id number] DOB: 1951/02/14    ADMISSION DATE:  01/11/2018 CONSULTATION DATE:  01/11/2018  REFERRING MD:  Dr. Betsey Holiday   CHIEF COMPLAINT:  Febrile   HISTORY OF PRESENT ILLNESS:   67 year old male with CAD, COPD/Bullous Emphysema, GERD, gastroparesis with recurrent aspiration, rheumatoid arthritis (on prednisone), Leukopenia, DM   Admission 4/29-5/3 with HCAP/Infected pulmonary bulla. Discharged on Augmentin then developed severe diarrhea. Admitted 5/4-5/17 with neutropenia. Fevers (tmax 102.8) with CXR showing left upper lobe thick-walled cavitary lesion with increased size and surrounding airspace disease. [facility] consulted and started trial of Granix for 3 days.    Presented 5/19 with AMS and fever. Upon arrival Temp 105. Patient ws recently discharged 5/17 and then followed up at [facility]. Wife reports he was doing well until this afternoon when he seemed to be confused as well as having diarrhea. Upon arrival patient is hypotensive, tachycardiac. LA 3.64, received 3L of normal saline, repeat 8. PCCM asked to admit.   SUBJECTIVE:  Hypotensive overnight requiring pressors at this point  VITAL SIGNS: BP 121/62   Pulse (!) 124   Temp (!) 102.8 F (39.3 C) (Rectal)   Resp (!) 28   Wt 243 lb 9.7 oz (110.5 kg)   SpO2 98%   BMI 31.28 kg/m   HEMODYNAMICS:    VENTILATOR SETTINGS:    INTAKE / OUTPUT: I/O last 3 completed shifts: In: 3400 [IV Piggyback:3400] Out: -   PHYSICAL EXAMINATION: General:  Chronically ill appearing male, NAD Neuro:  Awake and interactive, moving all ext to command HEENT:  [place]/AT, PERRL, EOM-I and MMM Cardiovascular:  IRIR, Nl S1/S2 and -M/R/G Lungs:  Decreased BS diffusely Abdomen:  Soft, NT, ND and +BS Musculoskeletal:  -edema and -tenderness Skin:  Warm, dry   LABS:  BMET Recent Labs  Lab 01/08/18 0404 01/09/18 0505 01/11/18 0014 01/11/18 0050  NA 139 138 136 136  K 3.8 3.9  3.6 3.6  CL 102 101 97* 97*  CO2 _0 --   BUN _1 CREATININE 0.54* 0.65 0.92 0.70  GLUCOSE 91 91 185* 185*   Electrolytes Recent Labs  Lab 01/07/18 0341 01/08/18 0404 01/09/18 0505 01/11/18 0014  CALCIUM 9.0 9.3 8.9 9.3  MG 1.6* 1.5* 1.8  --    CBC Recent Labs  Lab 01/08/18 0404 01/09/18 0505 01/11/18 0014 01/11/18 0050  WBC 6.1 7.2 5.8  --   HGB 11.2* 11.0* 14.8 15.3  HCT 34.7* 34.2* 45.1 45.0  PLT 281 244 218  --    Coag's Recent Labs  Lab 01/11/18 0014  INR 1.02   Sepsis Markers Recent Labs  Lab 01/11/18 0221 01/11/18 0245 01/11/18 0442 01/11/18 0715  LATICACIDVEN 8.08* 6.97*  --  3.0*  PROCALCITON  --   --  1.44  --    ABG No results for input(s): PHART, PCO2ART, PO2ART in the last 168 hours.  Liver Enzymes Recent Labs  Lab 01/05/18 0702 01/07/18 0341 01/11/18 0014  AST 22 17 32  ALT 18 16* 23  ALKPHOS 57 60 119  BILITOT 0.7 0.8 1.0  ALBUMIN 2.7* 2.6* 3.5   Cardiac Enzymes No results for input(s): TROPONINI, PROBNP in the last 168 hours.  Glucose Recent Labs  Lab 01/08/18 2010 01/09/18 0019 01/09/18 0452 01/09/18 0757 01/11/18 0504 01/11/18 0729  GLUCAP 173* 106* 88 84 110* 109*   Imaging Ct Chest W Contrast  Result Date: 01/11/2018 CLINICAL DATA:  Acute onset of generalized weakness and fever. Decreased oral intake. EXAM: CT CHEST WITH CONTRAST TECHNIQUE: Multidetector CT imaging of the chest was performed during intravenous contrast administration. CONTRAST:  177m OMNIPAQUE IOHEXOL 300 MG/ML  SOLN COMPARISON:  Chest radiograph performed earlier today at 12:22 a.m., and CT of the chest performed 12/22/2017 FINDINGS: Cardiovascular: The heart is normal in size. Diffuse coronary artery calcifications are seen. Scattered calcification noted along the thoracic aorta and proximal great vessels. The descending thoracic aorta is borderline normal in caliber. Mediastinum/Nodes: The patient is status post median sternotomy.  Visualized mediastinal nodes remain borderline normal in size. No pericardial effusion is identified. The thyroid gland is unremarkable. No axillary lymphadenopathy is seen. 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 Peripheral honeycombing is noted bilaterally. Mild interstitial prominence may reflect mild interstitial edema. No definite pleural effusion or pneumothorax is seen. Upper Abdomen: The visualized portions of the liver and spleen are unremarkable. The patient is status post cholecystectomy, with a clip noted at the gallbladder fossa. The visualized portions of the spleen are unremarkable. A tiny hiatal hernia is noted. Musculoskeletal: No acute osseous abnormalities are identified. The visualized musculature is unremarkable in appearance. IMPRESSION: 1. Multiple cavitations noted at the left lung apex, with new air-fluid levels, concerning for atypical infection superimposed on chronic bullous disease. 2. Mild interstitial prominence may reflect mild interstitial edema. 3. Diffuse coronary artery calcifications seen. 4. Tiny hiatal hernia noted. Electronically Signed   By: JGarald BaldingM.D.   On: 01/11/2018 04:23   Dg Chest Port 1 View  Result Date: 01/11/2018 CLINICAL DATA:  Sepsis, fever and weakness.  Dyspnea x1 day. EXAM: PORTABLE CHEST 1 VIEW COMPARISON:  Chest CT 12/22/2017, CXR 01/02/2018 FINDINGS: Stable cardiomegaly with post CABG change and aortic atherosclerosis. Redemonstration of left apical bullae, the largest is partially filled with fluid and thick-walled compatible with an infected bulla. This is slightly smaller in appearance than on prior more cavitary in appearance than recent comparison chest radiograph. Subsegmental scarring is seen in the right upper lobe and mid lung. Probable trace right effusion. No acute osseous abnormality.  IMPRESSION: 1. Cardiomegaly with aortic atherosclerosis and post CABG change. 2. Thick-walled left upper lobe bulla compatible with an infected bulla is redemonstrated though slightly more cavitary in appearance and less opacified than on prior comparison. This would suggest some partial clearing of fluid within. Electronically Signed   By: DAshley RoyaltyM.D.   On: 01/11/2018 00:46   STUDIES:  CXR 5/19 > 1. Cardiomegaly with aortic atherosclerosis and post CABG change. 2. Thick-walled left upper lobe bulla compatible with an infected bulla is redemonstrated though slightly more cavitary in appearance and less opacified than on prior comparison. This would suggest some partial clearing of fluid within. CT Chest 5/19 >  Multiple cavitations noted at the left lung apex, with new air-fluid levels, concerning for atypical infection superimposed on chronic bullous disease. 2. Mild interstitial prominence may reflect mild interstitial edema. 3. Diffuse coronary artery calcifications seen. 4. Tiny hiatal hernia noted.  CULTURES: Blood 5/19 >> Urine 5/19 >>  ANTIBIOTICS: Vancomycin 5/19 >> Zosyn 5/19 >>   SIGNIFICANT EVENTS: 5/19 > Presents to ED   LINES/TUBES: PIV   DISCUSSION: 67year old male with RA and immunocompromised presents to ED in Septic Shock. Tmax 105.   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 Multiple cavitations  noted at the left lung apex, with new air-fluid levels, concerning for atypical infection superimposed on chronic bullous disease. 2. Mild interstitial prominence may reflect mild interstitial edema. 3. Diffuse coronary artery calcifications seen. 4. Tiny hiatal hernia noted. H/O Bullous Emphysema/COPD  P:   Titrate O2 for sat of 88-92% Duoneb/Brovana/Pulmicort Pulmonary hygienes CXR in AM  Septic Shock  Chronic Diastolic HF (EF 95-32, Y2BX) A.Fib with RVR  P:  Tele monitoring Maintain MAP >65 Levophed for MAP of 65  mmHg  Anion Gap Metabolic Acidosis with Lactic Acidosis  P:   BMET in AM Replace electrolytes as indicated KVO IVF  GERD Diarrhea  Nausea  H/O Gastroparesis with recurrent aspiration  P:   Heart healthy carb modified diet Zofran PRN    RA on chronic steroids  Immunocompromised  Thrombocytopenia Persistent Neutropenia  P:  CBC now Transfuse per ICU protocol Consult Hematology for Recommendations > Patient sees [facility] outpatient and was going to undergo Bone Marrow Biopsy 5/20 for further work-up, call on Monday  Febrile (Tmax 105)  Recent PNA with infected Bulla  -U/A Negative  P:   Trend WBC and Fever Curve Trend LA and PCT  Follow Culture Data Continue Zosyn and Vancomycin   DM   P:   Trend Glucose  SSI  Stress dose steroids  FAMILY  - Updates: Patient updated bedside  - Inter-disciplinary family meet or Palliative Care meeting due by: 01/18/2018   The patient is critically ill with multiple organ systems failure and requires high complexity decision making for assessment and support, frequent evaluation and titration of therapies, application of advanced monitoring technologies and extensive interpretation of multiple databases.   Critical Care Time devoted to patient care services described in this note is  34  Minutes. This time reflects time of care of this signee Dr Jennet Maduro. This critical care time does not reflect procedure time, or teaching time or supervisory time of PA/NP/Med student/Med Resident etc but could involve care discussion time.  Rush Farmer, M.D. [facility] Pulmonary/Critical Care Medicine. Pager: [phone]. After hours pager: [phone].

## 2018-01-11 NOTE — Progress Notes (Signed)
PHARMACY - PHYSICIAN COMMUNICATION CRITICAL VALUE ALERT - BLOOD CULTURE IDENTIFICATION (BCID)  Joseph [name] is an 67 y.o. male who presented to [facility] on 01/11/2018 with a chief complaint of fever.   Assessment:  67 year old male admitted with fever/possible pneumonia.  Noted recent admissions for pneumonia in April and neutropenia in May. Now with gram positive rods in anaerobic bottle of 1 set of blood cultures and MRSE in 1 bottle of the other set. TMax-105 and  WBC 5.8.     Name of physician (or Provider) Contacted: Blackbox /Sommer  Current antibiotics: Vanc/Zosyn   Changes to prescribed antibiotics recommended:  Would not make changes based on this blood culture  Results for orders placed or performed during the hospital encounter of 01/11/18  Blood Culture ID Panel (Reflexed) (Collected: 01/11/2018 12:15 AM)  Result Value Ref Range   Enterococcus species NOT DETECTED NOT DETECTED   Listeria monocytogenes NOT DETECTED NOT DETECTED   Staphylococcus species DETECTED (A) NOT DETECTED   Staphylococcus aureus NOT DETECTED NOT DETECTED   Methicillin resistance DETECTED (A) NOT DETECTED   Streptococcus species NOT DETECTED NOT DETECTED   Streptococcus agalactiae NOT DETECTED NOT DETECTED   Streptococcus pneumoniae NOT DETECTED NOT DETECTED   Streptococcus pyogenes NOT DETECTED NOT DETECTED   Acinetobacter baumannii NOT DETECTED NOT DETECTED   Enterobacteriaceae species NOT DETECTED NOT DETECTED   Enterobacter cloacae complex NOT DETECTED NOT DETECTED   Escherichia coli NOT DETECTED NOT DETECTED   Klebsiella oxytoca NOT DETECTED NOT DETECTED   Klebsiella pneumoniae NOT DETECTED NOT DETECTED   Proteus species NOT DETECTED NOT DETECTED   Serratia marcescens NOT DETECTED NOT DETECTED   Haemophilus influenzae NOT DETECTED NOT DETECTED   Neisseria meningitidis NOT DETECTED NOT DETECTED   Pseudomonas aeruginosa NOT DETECTED NOT DETECTED   Candida albicans NOT DETECTED NOT  DETECTED   Candida glabrata NOT DETECTED NOT DETECTED   Candida krusei NOT DETECTED NOT DETECTED   Candida parapsilosis NOT DETECTED NOT DETECTED   Candida tropicalis NOT DETECTED NOT DETECTED   Sharin Mons, PharmD, BCPS PGY2 Infectious Diseases Pharmacy Resident Pager: [phone]  01/11/2018  7:10 PM

## 2018-01-11 NOTE — ED Triage Notes (Signed)
Pt bib [facility] from home d/t weakness, fever, poor po intake x 1 day.  Pt recently hospitalized.

## 2018-01-11 NOTE — Progress Notes (Signed)
Pharmacy Antibiotic Note  Joseph [name] is a 67 y.o. male admitted on 01/11/2018 with sepsis.  Pharmacy has been consulted for vancomycin and zosyn dosing.Vancomycin 1gm and zosyn 3.375 gm ordered in the ED.  Plan: Give addition vancomycin 750 mg for total load 1750 mg then 1250 mg IV q12 hours Continue zosyn 3.375 gm IV q8 hours F/u renal function, cultures and clinical course     Temp (24hrs), Avg:105 F (40.6 C), Min:105 F (40.6 C), Max:105 F (40.6 C)  Recent Labs  Lab 01/05/18 0702 01/06/18 0438 01/07/18 0341 01/08/18 0404 01/09/18 0505  WBC 3.4* 2.8* 3.9* 6.1 7.2  CREATININE 0.58*  --  0.54* 0.54* 0.65    Estimated Creatinine Clearance: 107 mL/min (by C-G formula based on SCr of 0.65 mg/dL).    No Known Allergies   Thank you for allowing pharmacy to be a part of this patient's care.  Talbert Cage Poteet 01/11/2018 12:40 AM

## 2018-01-11 NOTE — Progress Notes (Signed)
Blood cultures from this morning growing MRSA. Already on Vanc IV. Repeat blood cultures tomorrow AM to document clearance. Order TTE to eval for possible vegetations.

## 2018-01-11 NOTE — ED Provider Notes (Signed)
[facility] EMERGENCY DEPARTMENT Provider Note   CSN: [id number] Arrival date & time: 01/11/18  0000     History   Chief Complaint Chief Complaint  Patient presents with  . Blood Infection    HPI Joseph [name] is a 67 y.o. male.  Patient presents to the emergency department for evaluation of mental status changes and fever.  Patient comes from home.  Wife reports that he seemed to be confused from his baseline and felt warm to the touch.  He was brought to the ER by ambulance.  Blood pressure has been low.  He has been tachycardic.  Patient found to be febrile with a fever of 105 at arrival. Level V Caveat due to acuity.     Past Medical History:  Diagnosis Date  . CAD (coronary artery disease)    MI 2007, CABG  . Cataracts, bilateral   . Complication of anesthesia    DIFFICULTY AFTER CABG HAD CONFUSION POST OP  . COPD (chronic obstructive pulmonary disease) (HCC)   . Diabetes mellitus    Dx 07-2008 A1C 6.2  . Esophageal dysmotility   . Fatty liver 10/11/09  . Gastroesophageal reflux disease   . Gastroparesis   . Hiatal hernia   . Hypertension   . Neuropathy    (related to RA per neuro note 05-2011)  . Onychomycosis   . Osteoporosis    per DEXA 05-2008  . Oxygen dependent    3l  . Peripheral polyneuropathy    Severe, causing problems with his feet, see surgeries  . Psoriasis   . Pulmonary nodules   . Rheumatoid arthritis (HCC)    [facility]  . Septic shock (HCC) 07/2017    Patient Active Problem List   Diagnosis Date Noted  . Sepsis (HCC) 01/11/2018  . Acute on chronic respiratory failure with hypoxia (HCC)   . Hypokalemia 12/24/2017  . Thrombocytopenia (HCC) 12/24/2017  . Leucopenia 12/24/2017  . Healthcare-associated pneumonia 12/22/2017  . Current chronic use of systemic steroids 12/22/2017  . Dyslipidemia 12/15/2017  . Fever 11/09/2017  . Neutropenic fever (HCC) 11/09/2017  . Septic shock (HCC) 08/14/2017  . PVC  (premature ventricular contraction) 07/29/2017  . Atrial flutter (HCC) 10/02/2016  . Hemoptysis 02/23/2016  . Chronic diastolic heart failure (HCC)   . Troponin level elevated   . Chest pain 01/19/2016  . PCP NOTES >>>>>>>>>>>>>>>>>>>>>>>>>>>>>>>>>>>> 05/16/2015  . Chronic obstructive pulmonary emphysema (HCC) 01/13/2015  . Hepatic steatosis 04/17/2014  . Bronchiectasis (HCC) 05/05/2013  . Recurrent pneumonia 11/30/2012  . Pulmonary nodules 11/30/2012  . Gastroparesis 10/20/2012  . Barrett's esophagus 10/19/2012  . Chronic respiratory failure with hypoxia (HCC) 10/17/2012  . Annual physical exam 05/08/2012  . Sicca syndrome (HCC) 10/11/2011  . CAD (coronary artery disease)   . Carotid stenosis 12/10/2010  . Rheumatoid arthritis with rheumatoid factor (HCC) 11/02/2010  . HOARSENESS 10/22/2010  . HLD (hyperlipidemia) 09/12/2009  . Type 2 diabetes, controlled, with neuropathy (HCC) 08/01/2008  . Pain in Soft Tissues of Limb 07/13/2008  . Osteoporosis 07/13/2008  . Coronary atherosclerosis 11/25/2006  . 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 Essential hypertension 09/16/2006  . GERD, Barrett's (09-2012 ), gastroparesis 06/06/2006  . PSORIASIS 06/06/2006    Past Surgical History:  Procedure Laterality Date  . A-FLUTTER ABLATION N/A 11/27/2016   Procedure: A-Flutter Ablation;  Surgeon: Marinus Maw, MD;  Location: [facility] INVASIVE CV LAB;  Service: Cardiovascular;  Laterality: N/A;  . achiles tendon surgery  8/09  .  CARDIAC CATHETERIZATION N/A 01/23/2016   Procedure: Left Heart Cath and Cors/Grafts Angiography;  Surgeon: Lyn Records, MD;  Location: [facility] INVASIVE CV LAB;  Service: Cardiovascular;  Laterality: N/A;  . CARDIOVERSION N/A 10/23/2016   Procedure: CARDIOVERSION;  Surgeon: Rollene Rotunda, MD;  Location: [facility] ENDOSCOPY;  Service: Cardiovascular;  Laterality: N/A;  . CATARACT EXTRACTION Right 03/2017  . CATARACT EXTRACTION Left 05/2017  . CHOLECYSTECTOMY      . CORONARY ARTERY BYPASS GRAFT  2007   (LIMA to the LAD, left radial to obtuse marginal, SVG to first diagonal, SVG to PDA. His ejection fraction to 50-55%  . ESOPHAGOGASTRODUODENOSCOPY N/A 10/19/2012   Procedure: ESOPHAGOGASTRODUODENOSCOPY (EGD);  Surgeon: Hart Carwin, MD;  Location: [facility] ENDOSCOPY;  Service: Endoscopy;  Laterality: N/A;  the dilatation is possible.   Marland Kitchen FOOT SURGERY     to tendon release and repair of osteomyelitis   . LARYNGOSCOPY N/A 01/21/2014   Procedure: LARYNGOSCOPY;  Surgeon: Christia Reading, MD;  Location: [facility] OR;  Service: ENT;  Laterality: N/A;  micro direct laryngoscopy with prolaryn injection/jet venturi ventilation  . TOE AMPUTATION  4-12/ 3 14   d/t a deformity, found to have osteomyelitis, complicated by post-op foot strtess FX        Home Medications    Prior to Admission medications   Medication Sig Start Date End Date Taking? Authorizing Provider  albuterol (PROAIR HFA) 108 (90 Base) MCG/ACT inhaler Inhale 2 puffs into the lungs every 4 (four) hours as needed for wheezing or shortness of breath. 12/01/17  Yes [name], MD  Ascorbic Acid (VITAMIN C) 500 MG tablet Take 500 mg by mouth daily.     Yes [provider]  aspirin EC 81 MG tablet Take 81 mg by mouth daily.   Yes [provider]  atorvastatin (LIPITOR) 80 MG tablet TAKE 1 TABLET (80mg ) BY MOUTH DAILY Patient taking differently: Take 80 mg by mouth at bedtime.  12/15/17  Yes 12/17/17, MD  B Complex-C (SUPER B COMPLEX PO) Take 1 tablet by mouth daily.    Yes [provider]  Carbonyl Iron (PERFECT IRON PO) Take 50 mg by mouth daily.    Yes [provider]  clobetasol cream (TEMOVATE) 0.05 % Apply 1 application topically 2 (two) times daily as needed (rash).    Yes [provider]  dextromethorphan-guaiFENesin (MUCINEX DM) 30-600 MG 12hr tablet Take 1 tablet by mouth at bedtime.   Yes [provider]  folic acid (FOLVITE) 800 MCG tablet Take  800 mcg by mouth daily.   Yes [provider]  furosemide (LASIX) 20 MG tablet Take 20-40 mg by mouth See admin instructions. Take 2 tablets every morning and take 1 tablet at 3 pm   Yes [provider]  hydrocortisone cream 1 % Apply 1 application topically 3 (three) times daily as needed for itching.   Yes [provider]  ipratropium-albuterol (DUONEB) 0.5-2.5 (3) MG/3ML SOLN Take 3 mLs by nebulization every 4 (four) hours as needed (shortness of breath or wheezing). 12/01/17  Yes 01/31/18, MD  loperamide (IMODIUM) 2 MG capsule Take 1 capsule (2 mg total) by mouth as needed for diarrhea or loose stools. 01/03/18  Yes 03/05/18, MD  Loteprednol Etabonate (LOTEMAX) 0.5 % GEL Place 1 drop into both eyes daily.  07/01/14  Yes [provider]  Melatonin 1 MG CAPS Take 4 mg by mouth as needed (Sleep).   Yes [provider]  Multiple Vitamins-Minerals (VITEYES AREDS  ADVANCED PO) Take 1 tablet by mouth daily.   Yes [provider]  nitroGLYCERIN (NITROSTAT) 0.4 MG SL tablet Place 1 tablet (0.4 mg total) under the tongue every 5 (five) minutes as needed for chest pain (up to 3 doses for severe chest pain. If no relief after 3rd dose, proceed to the ED for an evaluation). 10/04/16  Yes Rollene Rotunda, MD  OVER THE COUNTER MEDICATION Apply topically as needed. T-Gel Shampoo   Yes [provider]  oxyCODONE-acetaminophen (PERCOCET) 7.5-325 MG per tablet Take 1 tablet by mouth every 6 (six) hours as needed for moderate pain.    Yes [provider]  pantoprazole (PROTONIX) 40 MG tablet Take 1 tablet (40 mg total) by mouth 2 (two) times daily. 05/16/17  Yes Armbruster, Willaim Rayas, MD  polyethylene glycol (MIRALAX / GLYCOLAX) packet Take 17 g by mouth daily.   Yes [provider]  potassium chloride (K-DUR,KLOR-CON) 10 MEQ tablet Take 20-40 mEq by mouth See admin instructions. Take 4 tablets every morning and take 2 tablets every  evening   Yes [provider]  predniSONE (DELTASONE) 20 MG tablet Take 15 mg by mouth daily with breakfast.    Yes [provider]  pregabalin (LYRICA) 75 MG capsule Take 75 mg by mouth 2 (two) times daily.   Yes [provider]  sodium chloride (OCEAN) 0.65 % SOLN nasal spray Place 1 spray into both nostrils as needed for congestion.   Yes [provider]  TRELEGY ELLIPTA 100-62.5-25 MCG/INH AEPB Inhale 1 puff into the lungs daily. 12/01/17  Yes [name], MD  OXYGEN Inhale 3 L into the lungs continuous.    [provider]    Family History Family History  Problem Relation Age of Onset  . Heart disease Mother   . Diverticulitis Mother   . Heart disease Father   . Heart attack Father        x2  . Colon cancer Neg Hx   . Prostate cancer Neg Hx     Social History Social History   Tobacco Use  . Smoking status: Former Smoker    Packs/day: 1.00    Years: 40.00    Pack years: 40.00    Last attempt to quit: 08/26/2005    Years since quitting: 12.3  . Smokeless tobacco: Former Neurosurgeon    Quit date: 04/02/2006  . Tobacco comment: smoked x 40 years, 1 ppd  Substance Use Topics  . Alcohol use: No  . Drug use: No     Allergies   Patient has no known allergies.   Review of Systems Review of Systems  Unable to perform ROS: Acuity of condition     Physical Exam Updated Vital Signs BP 121/62   Pulse (!) 124   Temp (!) 103.8 F (39.9 C) (Rectal)   Resp (!) 28   SpO2 98%   Physical Exam  Constitutional: He appears well-developed and well-nourished. He appears distressed.  HENT:  Head: Normocephalic and atraumatic.  Eyes: Pupils are equal, round, and reactive to light.  Neck: Neck supple.  Cardiovascular: Regular rhythm. Tachycardia present.  Pulmonary/Chest: Breath sounds normal. Tachypnea noted.  Abdominal: Soft. There is no tenderness.  Musculoskeletal: Normal range of motion. He exhibits no edema.  Neurological: He is  alert. No cranial nerve deficit or sensory deficit. GCS eye subscore is 4. GCS verbal subscore is 4. GCS motor subscore is 6.  Skin: Skin is warm.     ED Treatments / Results  Labs (  all labs ordered are listed, but only abnormal results are displayed) Labs Reviewed  COMPREHENSIVE METABOLIC PANEL - Abnormal; Notable for the following components:      Result Value   Chloride 97 (*)    Glucose, Bld 185 (*)    All other components within normal limits  CBC WITH DIFFERENTIAL/PLATELET - Abnormal; Notable for the following components:   RDW 15.9 (*)    Monocytes Absolute 1.2 (*)    All other components within normal limits  I-STAT CG4 LACTIC ACID, ED - Abnormal; Notable for the following components:   Lactic Acid, Venous 3.64 (*)    All other components within normal limits  I-STAT VENOUS BLOOD GAS, ED - Abnormal; Notable for the following components:   pO2, Ven 24.0 (*)    Acid-base deficit 6.0 (*)    All other components within normal limits  I-STAT CHEM 8, ED - Abnormal; Notable for the following components:   Chloride 97 (*)    Glucose, Bld 185 (*)    Calcium, Ion 1.09 (*)    All other components within normal limits  I-STAT CG4 LACTIC ACID, ED - Abnormal; Notable for the following components:   Lactic Acid, Venous 8.08 (*)    All other components within normal limits  I-STAT CG4 LACTIC ACID, ED - Abnormal; Notable for the following components:   Lactic Acid, Venous 6.97 (*)    All other components within normal limits  CULTURE, BLOOD (ROUTINE X 2)  CULTURE, BLOOD (ROUTINE X 2)  URINE CULTURE  URINALYSIS, ROUTINE W REFLEX MICROSCOPIC  PROTIME-INR  LACTIC ACID, PLASMA  LACTIC ACID, PLASMA  LACTIC ACID, PLASMA  CORTISOL  PROCALCITONIN    EKG EKG Interpretation  Date/Time:  Sunday Jan 11 2018 00:11:32 EDT Ventricular Rate:  133 PR Interval:    QRS Duration: 66 QT Interval:  262 QTC Calculation: 390 R Axis:   29 Text Interpretation:  Sinus tachycardia Multiform  ventricular premature complexes Probable left atrial enlargement Repol abnrm suggests ischemia, anterolateral Confirmed by Gilda Crease [phone]) on 01/11/2018 1:52:00 AM   Radiology Ct Chest W Contrast  Result Date: 01/11/2018 CLINICAL DATA:  Acute onset of generalized weakness and fever. Decreased oral intake. EXAM: CT CHEST WITH CONTRAST TECHNIQUE: Multidetector CT imaging of the chest was performed during intravenous contrast administration. CONTRAST:  OMNIPAQUE IOHEXOL 300 MG/ML  SOLN COMPARISON:  Chest radiograph performed earlier today at 12:22 a.m., and CT of the chest performed 12/22/2017 FINDINGS: Cardiovascular: The heart is normal in size. Diffuse coronary artery calcifications are seen. Scattered calcification noted along the thoracic aorta and proximal great vessels. The descending thoracic aorta is borderline normal in caliber. Mediastinum/Nodes: The patient is status post median sternotomy. Visualized mediastinal nodes remain borderline normal in size. No pericardial effusion is identified. The thyroid gland is unremarkable. No axillary lymphadenopathy is seen. 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 Peripheral honeycombing is noted bilaterally. Mild interstitial prominence may reflect mild interstitial edema. No definite pleural effusion or pneumothorax is seen. Upper Abdomen: The visualized portions of the liver and spleen are unremarkable. The patient is status post cholecystectomy, with a clip noted at the gallbladder fossa. The visualized portions of the spleen are unremarkable. A tiny hiatal hernia is noted. Musculoskeletal: No acute osseous abnormalities are identified. The visualized musculature is unremarkable in appearance. IMPRESSION: 1. Multiple cavitations noted at the left lung apex, with new air-fluid levels,  concerning for  atypical infection superimposed on chronic bullous disease. 2. Mild interstitial prominence may reflect mild interstitial edema. 3. Diffuse coronary artery calcifications seen. 4. Tiny hiatal hernia noted. Electronically Signed   By: Roanna Raider M.D.   On: 01/11/2018 04:23   Dg Chest Port 1 View  Result Date: 01/11/2018 CLINICAL DATA:  Sepsis, fever and weakness.  Dyspnea x1 day. EXAM: PORTABLE CHEST 1 VIEW COMPARISON:  Chest CT 12/22/2017, CXR 01/02/2018 FINDINGS: Stable cardiomegaly with post CABG change and aortic atherosclerosis. Redemonstration of left apical bullae, the largest is partially filled with fluid and thick-walled compatible with an infected bulla. This is slightly smaller in appearance than on prior more cavitary in appearance than recent comparison chest radiograph. Subsegmental scarring is seen in the right upper lobe and mid lung. Probable trace right effusion. No acute osseous abnormality. IMPRESSION: 1. Cardiomegaly with aortic atherosclerosis and post CABG change. 2. Thick-walled left upper lobe bulla compatible with an infected bulla is redemonstrated though slightly more cavitary in appearance and less opacified than on prior comparison. This would suggest some partial clearing of fluid within. Electronically Signed   By: Tollie Eth M.D.   On: 01/11/2018 00:46    Procedures Procedures (including critical care time)  Medications Ordered in ED Medications  piperacillin-tazobactam (ZOSYN) IVPB 3.375 g (has no administration in time range)  vancomycin (VANCOCIN) 1,250 mg in sodium chloride 0.9 % 250 mL IVPB (has no administration in time range)  sodium chloride 0.9 % bolus 1,000 mL (1,000 mLs Intravenous Transfusing/Transfer 01/11/18 0431)    Followed by  sodium chloride 0.9 % bolus 1,000 mL (has no administration in time range)  budesonide (PULMICORT) nebulizer solution 0.5 mg (has no administration in time range)  arformoterol (BROVANA) nebulizer solution 15 mcg (has no  administration in time range)  ipratropium-albuterol (DUONEB) 0.5-2.5 (3) MG/3ML nebulizer solution 3 mL (has no administration in time range)  insulin aspart (novoLOG) injection 2-6 Units (has no administration in time range)  0.9 %  sodium chloride infusion (has no administration in time range)  heparin injection 5,000 Units (has no administration in time range)  ibuprofen (CALDOLOR) 400 mg in sodium chloride 0.9 % 100 mL IVPB (has no administration in time range)  ondansetron (ZOFRAN) injection 4 mg (has no administration in time range)  piperacillin-tazobactam (ZOSYN) IVPB 3.375 g (0 g Intravenous Stopped 01/11/18 0105)  vancomycin (VANCOCIN) IVPB 1000 mg/200 mL premix (0 mg Intravenous Stopped 01/11/18 0146)  sodium chloride 0.9 % bolus 1,000 mL (0 mLs Intravenous Stopped 01/11/18 0105)    And  sodium chloride 0.9 % bolus 1,000 mL (0 mLs Intravenous Stopped 01/11/18 0146)    And  sodium chloride 0.9 % bolus 1,000 mL (0 mLs Intravenous Stopped 01/11/18 0210)  vancomycin (VANCOCIN) IVPB 750 mg/150 ml premix (0 mg Intravenous Stopped 01/11/18 0218)  acetaminophen (TYLENOL) suppository 650 mg (650 mg Rectal Given 01/11/18 0232)  iohexol (OMNIPAQUE) 300 MG/ML solution 100 mL (100 mLs Intravenous Contrast Given 01/11/18 0346)     Initial Impression / Assessment and Plan / ED Course  I have reviewed the triage vital signs and the nursing notes.  Pertinent labs & imaging results that were available during my care of the patient were reviewed by me and considered in my medical decision making (see chart for details).     Patient presents to the emergency department for evaluation of fever and altered mental status.  Patient has a complex past medical history.  He is being worked up at [facility] for neutropenia of unclear etiology.  Patient has had multiple hospitalizations with infections over this period of time.  He was recently hospitalized with pneumonia.  At arrival patient is tachypneic  but does not appear to be in respiratory distress.  He is moving air well.  This is likely secondary to his general distress as well as fever of 105 Fahrenheit.  Patient does not have abdominal pain or tenderness.  He is awake and alert but confused.  Patient was noted to be hypotensive at arrival with tachycardia along with a fever.  Sepsis protocol was initiated.  Blood pressure and heart rate have improved with IV fluids.  His lactic acid at presentation was 3.64.  Repeat lactic acid, however, after fluids was 8.  This was discussed with Dr. Darrick Penna, on-call for critical care, at 02:45.  Patient will be put on the critical care list for evaluation, however, there will be some delay due to their high patient volume.  Dr. Louanne Skye recommended 2 more liters of fluid and rechecking the lactic acid.   CRITICAL CARE Performed by: Gilda Crease   Total critical care time: 35 minutes  Critical care time was exclusive of separately billable procedures and treating other patients.  Critical care was necessary to treat or prevent imminent or life-threatening deterioration.  Critical care was time spent personally by me on the following activities: development of treatment plan with patient and/or surrogate as well as nursing, discussions with consultants, evaluation of patient's response to treatment, examination of patient, obtaining history from patient or surrogate, ordering and performing treatments and interventions, ordering and review of laboratory studies, ordering and review of radiographic studies, pulse oximetry and re-evaluation of patient's condition.   Final Clinical Impressions(s) / ED Diagnoses   Final diagnoses:  Sepsis, due to unspecified organism [facility])    ED Discharge Orders    None       Gilda Crease, MD 01/11/18 [id number]

## 2018-01-12 ENCOUNTER — Inpatient Hospital Stay (HOSPITAL_COMMUNITY): Payer: Medicare HMO

## 2018-01-12 ENCOUNTER — Other Ambulatory Visit: Payer: Self-pay

## 2018-01-12 DIAGNOSIS — I361 Nonrheumatic tricuspid (valve) insufficiency: Secondary | ICD-10-CM

## 2018-01-12 LAB — CBC
HEMATOCRIT: 30.4 % — AB (ref 39.0–52.0)
HEMOGLOBIN: 10 g/dL — AB (ref 13.0–17.0)
MCH: 27.7 pg (ref 26.0–34.0)
MCHC: 32.9 g/dL (ref 30.0–36.0)
MCV: 84.2 fL (ref 78.0–100.0)
Platelets: 145 10*3/uL — ABNORMAL LOW (ref 150–400)
RBC: 3.61 MIL/uL — AB (ref 4.22–5.81)
RDW: 16.3 % — ABNORMAL HIGH (ref 11.5–15.5)
WBC: 4 10*3/uL (ref 4.0–10.5)

## 2018-01-12 LAB — BASIC METABOLIC PANEL
ANION GAP: 10 (ref 5–15)
BUN: 9 mg/dL (ref 6–20)
CO2: 23 mmol/L (ref 22–32)
Calcium: 8.3 mg/dL — ABNORMAL LOW (ref 8.9–10.3)
Chloride: 111 mmol/L (ref 101–111)
Creatinine, Ser: 0.54 mg/dL — ABNORMAL LOW (ref 0.61–1.24)
GFR calc Af Amer: >60 mL/min (ref >60)
GFR calc non Af Amer: >60 mL/min (ref >60)
GLUCOSE: 149 mg/dL — AB (ref 65–99)
POTASSIUM: 2.9 mmol/L — AB (ref 3.5–5.1)
Sodium: 144 mmol/L (ref 135–145)

## 2018-01-12 LAB — GLUCOSE, CAPILLARY
GLUCOSE-CAPILLARY: 128 mg/dL — AB (ref 65–99)
Glucose-Capillary: 132 mg/dL — ABNORMAL HIGH (ref 65–99)
Glucose-Capillary: 201 mg/dL — ABNORMAL HIGH (ref 65–99)
Glucose-Capillary: 179 mg/dL — ABNORMAL HIGH (ref 65–99)
Glucose-Capillary: 172 mg/dL — ABNORMAL HIGH (ref 65–99)
Glucose-Capillary: 146 mg/dL — ABNORMAL HIGH (ref 65–99)

## 2018-01-12 LAB — MAGNESIUM: MAGNESIUM: 1.6 mg/dL — AB (ref 1.7–2.4)

## 2018-01-12 LAB — URINE CULTURE: CULTURE: NO GROWTH

## 2018-01-12 LAB — PHOSPHORUS: Phosphorus: 3.6 mg/dL (ref 2.5–4.6)

## 2018-01-12 LAB — ECHOCARDIOGRAM COMPLETE
Height: 74 in
Weight: 3911.84 oz

## 2018-01-12 LAB — TROPONIN I: TROPONIN I: 0.07 ng/mL — AB (ref <0.03)

## 2018-01-12 LAB — PROCALCITONIN: Procalcitonin: 2.07 ng/mL

## 2018-01-12 MED ORDER — NITROGLYCERIN 0.4 MG SL SUBL
SUBLINGUAL_TABLET | SUBLINGUAL | Status: AC
  Filled 2018-01-12: qty 1

## 2018-01-12 MED ORDER — NITROGLYCERIN 0.4 MG SL SUBL
0.4000 mg | SUBLINGUAL_TABLET | SUBLINGUAL | Status: DC | PRN
Start: 2018-01-12 — End: 2018-01-24
  Administered 2018-01-12: 0.4 mg via SUBLINGUAL

## 2018-01-12 MED ORDER — POTASSIUM CHLORIDE CRYS ER 20 MEQ PO TBCR
40.0000 meq | EXTENDED_RELEASE_TABLET | Freq: Once | ORAL | Status: AC
  Administered 2018-01-12: 40 meq via ORAL
  Filled 2018-01-12: qty 2

## 2018-01-12 MED ORDER — PREDNISONE 5 MG PO TABS
15.0000 mg | ORAL_TABLET | Freq: Every day | ORAL | Status: DC
  Administered 2018-01-12 – 2018-01-23 (×12): 15 mg via ORAL
  Filled 2018-01-12 (×14): qty 1

## 2018-01-12 NOTE — H&P (Signed)
PULMONARY / CRITICAL CARE MEDICINE   Name: Joseph [name] MRN: [id number] DOB: [birth year]    ADMISSION DATE:  01/11/2018 CONSULTATION DATE:  01/11/2018  REFERRING MD:  Dr. Betsey Holiday   CHIEF COMPLAINT:  Febrile   HISTORY OF PRESENT ILLNESS:   67 year old male with CAD, COPD/Bullous Emphysema, GERD, gastroparesis with recurrent aspiration, rheumatoid arthritis (on prednisone), Leukopenia, DM   Admission 4/29-5/3 with HCAP/Infected pulmonary bulla. Discharged on Augmentin then developed severe diarrhea. Admitted 5/4-5/17 with neutropenia. Fevers (tmax 102.8) with CXR showing left upper lobe thick-walled cavitary lesion with increased size and surrounding airspace disease. [facility] consulted and started trial of Granix for 3 days.    Presented 5/19 with AMS and fever. Upon arrival Temp 105. Patient ws recently discharged 5/17 and then followed up at [facility]. Wife reports he was doing well until this afternoon when he seemed to be confused as well as having diarrhea. Upon arrival patient is hypotensive, tachycardiac. LA 3.64, received 3L of normal saline, repeat 8. PCCM asked to admit.   SUBJECTIVE:  Feels better. Still weak.  More alert per wife. Appetite has come back.  VITAL SIGNS: BP 116/61 (BP Location: Right Arm)   Pulse 78   Temp 98.5 F (36.9 C) (Rectal)   Resp 15   Ht '6\' 2"'  (1.88 m)   Wt 244 lb 7.8 oz (110.9 kg)   SpO2 100%   BMI 31.39 kg/m   HEMODYNAMICS:  Normotensive off phenylephrine.  VENTILATOR SETTINGS:  Not ventilated  INTAKE / OUTPUT: I/O last 3 completed shifts: In: 5946.3 [I.V.:1646.3; IV [id number]:3736] Out: 2525 [Urine:2525]  PHYSICAL EXAMINATION: General:  Chronically ill appearing male, NAD Neuro:  Awake and interactive, moving all ext to command HEENT:  [place]/AT, PERRL, EOM-I and MMM Cardiovascular:  IRIR, Nl S1/S2 and -M/R/G Lungs:  Normal breaths sounds. Abdomen:  Soft, NT, ND and +BS Musculoskeletal:  -edema and -tenderness Skin:  Warm, dry.  Multiple ecchymoses. LABS:  BMET Recent Labs  Lab 01/09/18 0505 01/11/18 0014 01/11/18 0050 01/12/18 0654  NA 138 136 136 144  K 3.9 3.6 3.6 2.9*  CL 101 97* 97* 111  CO2 26 24  --  23  BUN '16 15 17 9  ' CREATININE 0.65 0.92 0.70 0.54*  GLUCOSE 91 185* 185* 149*   Electrolytes Recent Labs  Lab 01/08/18 0404 01/09/18 0505 01/11/18 0014 01/12/18 0654  CALCIUM 9.3 8.9 9.3 8.3*  MG 1.5* 1.8  --  1.6*  PHOS  --   --   --  3.6   CBC Recent Labs  Lab 01/09/18 0505 01/11/18 0014 01/11/18 0050 01/12/18 0654  WBC 7.2 5.8  --  4.0  HGB 11.0* 14.8 15.3 10.0*  HCT 34.2* 45.1 45.0 30.4*  PLT 244 218  --  145*   Coag's Recent Labs  Lab 01/11/18 0014  INR 1.02   Sepsis Markers Recent Labs  Lab 01/11/18 0442  01/11/18 1312 01/11/18 2001 01/11/18 2234 01/12/18 0654  LATICACIDVEN  --    < > 2.8* 2.5* 2.4*  --   PROCALCITON 1.44  --   --   --   --  2.07   < > = values in this interval not displayed.   ABG No results for input(s): PHART, PCO2ART, PO2ART in the last 168 hours.  Liver Enzymes Recent Labs  Lab 01/07/18 0341 01/11/18 0014  AST 17 32  ALT 16* 23  ALKPHOS 60 119  BILITOT 0.8 1.0  ALBUMIN 2.6* 3.5   Cardiac Enzymes No results  for input(s): TROPONINI, PROBNP in the last 168 hours.  Glucose Recent Labs  Lab 01/11/18 1203 01/11/18 1543 01/11/18 1931 01/11/18 2349 01/12/18 0348 01/12/18 0748  GLUCAP 149* 167* 151* 151* 128* 132*   Imaging No results found. STUDIES:  CXR 5/19 > 1. Cardiomegaly with aortic atherosclerosis and post CABG change. 2. Thick-walled left upper lobe bulla compatible with an infected bulla is redemonstrated though slightly more cavitary in appearance and less opacified than on prior comparison. This would suggest some partial clearing of fluid within. CT Chest 5/19 >  Multiple cavitations noted at the left lung apex, with new air-fluid levels, concerning for atypical infection superimposed on chronic bullous  disease. 2. Mild interstitial prominence may reflect mild interstitial edema. 3. Diffuse coronary artery calcifications seen. 4. Tiny hiatal hernia noted.  CULTURES: Blood 5/19 >> Growing Staphylococcus in blood. Likely Staph epidermidis. Urine 5/19 >> NGTD.  ANTIBIOTICS: Vancomycin 5/19 >> Zosyn 5/19 >>   SIGNIFICANT EVENTS: 5/19 > Presents to ED   LINES/TUBES: PIV   DISCUSSION: 67 year old male with RA and immunocompromised presents to ED in Septic Shock. Tmax 105.   Looking better today. Cultures pending. Will need to decide on definitive therapy for  Cavitary lung lesions.  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 Multiple cavitations noted at the left lung apex, with new air-fluid levels, concerning for atypical infection superimposed on chronic bullous disease. 2. Mild interstitial prominence may reflect mild interstitial edema. 3. Diffuse coronary artery calcifications seen. 4. Tiny hiatal hernia noted. 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  Anion Gap Metabolic Acidosis with Lactic Acidosis - resolved. Hypokalemic.  P:   Correct hypokalemia.  KVO IVF  GERD Diarrhea  Nausea  H/O Gastroparesis with recurrent aspiration  P:   Heart healthy carb modified diet Zofran PRN    RA on chronic steroids  Immunocompromised  Thrombocytopenia Persistent Neutropenia  P:  CBC now Transfuse per ICU protocol Consult Hematology for Recommendations > Patient sees [facility] outpatient and was going to undergo Bone Marrow Biopsy 5/20 for further work-up, call on Monday  Febrile (Tmax 105)  Recent PNA with infected Bulla  -U/A Negative  P:   Trend WBC and Fever Curve Trend LA and PCT   Follow Culture Data Continue Zosyn and Vancomycin   DM   P:   Trend Glucose  SSI  Stress dose steroids  FAMILY  - Updates: Patient updated bedside  - Inter-disciplinary family meet or Palliative Care meeting due by: 01/18/2018   The patient is critically ill with multiple organ systems failure and requires high complexity decision making for assessment and support, frequent evaluation and titration of therapies, application of advanced monitoring technologies and extensive interpretation of multiple databases.   Critical Care Time devoted to patient care services described in this note is 35 Minutes. This time reflects time of care of this signee Dr Jennet Maduro. This critical care time does not reflect procedure time, or teaching time or supervisory time of PA/NP/Med student/Med Resident etc but could involve care discussion time.  Kipp Brood, M.D. [facility] Pulmonary/Critical Care Medicine. Pager: [phone]. After hours pager: [phone].

## 2018-01-12 NOTE — Progress Notes (Signed)
  Echocardiogram 2D Echocardiogram has been performed.  Joseph [name] 01/12/2018, 9:12 AM

## 2018-01-12 NOTE — Progress Notes (Signed)
eLink Physician-Brief Progress Note Patient Name: Joseph [name] DOB: 03-Feb-1951 MRN: [id number]   Date of Service  01/12/2018  HPI/Events of Note  Patient c/o chest pain/pressure. EKG - NSR with occasional PVC's.  eICU Interventions  Will order: 1. Nitroglycerin 0.4 mg SL Q 5 minutes PRN chest pain.  2. Cycle Troponin.      Intervention Category Major Interventions: Other:  Lenell Antu 01/12/2018, 9:36 PM

## 2018-01-13 ENCOUNTER — Inpatient Hospital Stay: Payer: Medicare HMO | Admitting: Internal Medicine

## 2018-01-13 ENCOUNTER — Encounter (HOSPITAL_COMMUNITY): Payer: Self-pay | Admitting: *Deleted

## 2018-01-13 ENCOUNTER — Inpatient Hospital Stay (HOSPITAL_COMMUNITY): Payer: Medicare HMO

## 2018-01-13 LAB — BASIC METABOLIC PANEL
ANION GAP: 9 (ref 5–15)
Anion gap: 9 (ref 5–15)
BUN: 11 mg/dL (ref 6–20)
BUN: 10 mg/dL (ref 6–20)
CALCIUM: 8.4 mg/dL — AB (ref 8.9–10.3)
CHLORIDE: 111 mmol/L (ref 101–111)
CO2: 23 mmol/L (ref 22–32)
CO2: 21 mmol/L — ABNORMAL LOW (ref 22–32)
CREATININE: 0.65 mg/dL (ref 0.61–1.24)
Calcium: 8.4 mg/dL — ABNORMAL LOW (ref 8.9–10.3)
Chloride: 109 mmol/L (ref 101–111)
Creatinine, Ser: 0.51 mg/dL — ABNORMAL LOW (ref 0.61–1.24)
GFR calc Af Amer: >60 mL/min (ref >60)
GFR calc non Af Amer: >60 mL/min (ref >60)
GLUCOSE: 126 mg/dL — AB (ref 65–99)
Glucose, Bld: 153 mg/dL — ABNORMAL HIGH (ref 65–99)
POTASSIUM: 3.3 mmol/L — AB (ref 3.5–5.1)
Potassium: 3.3 mmol/L — ABNORMAL LOW (ref 3.5–5.1)
SODIUM: 141 mmol/L (ref 135–145)
SODIUM: 141 mmol/L (ref 135–145)

## 2018-01-13 LAB — CBC WITH DIFFERENTIAL/PLATELET
Abs Immature Granulocytes: 0 10*3/uL (ref 0.0–0.1)
BASOS ABS: 0 10*3/uL (ref 0.0–0.1)
Basophils Relative: 0 %
EOS ABS: 0 10*3/uL (ref 0.0–0.7)
EOS PCT: 0 %
HCT: 27.6 % — ABNORMAL LOW (ref 39.0–52.0)
HEMOGLOBIN: 8.9 g/dL — AB (ref 13.0–17.0)
Immature Granulocytes: 1 %
LYMPHS PCT: 33 %
Lymphs Abs: 1 10*3/uL (ref 0.7–4.0)
MCH: 27.3 pg (ref 26.0–34.0)
MCHC: 32.2 g/dL (ref 30.0–36.0)
MCV: 84.7 fL (ref 78.0–100.0)
MONO ABS: 0.5 10*3/uL (ref 0.1–1.0)
Monocytes Relative: 16 %
Neutro Abs: 1.4 10*3/uL — ABNORMAL LOW (ref 1.7–7.7)
Neutrophils Relative %: 50 %
Platelets: 121 10*3/uL — ABNORMAL LOW (ref 150–400)
RBC: 3.26 MIL/uL — ABNORMAL LOW (ref 4.22–5.81)
RDW: 16.2 % — AB (ref 11.5–15.5)
WBC: 2.9 10*3/uL — AB (ref 4.0–10.5)

## 2018-01-13 LAB — GLUCOSE, CAPILLARY
GLUCOSE-CAPILLARY: 115 mg/dL — AB (ref 65–99)
GLUCOSE-CAPILLARY: 172 mg/dL — AB (ref 65–99)
Glucose-Capillary: 126 mg/dL — ABNORMAL HIGH (ref 65–99)
Glucose-Capillary: 151 mg/dL — ABNORMAL HIGH (ref 65–99)
Glucose-Capillary: 154 mg/dL — ABNORMAL HIGH (ref 65–99)

## 2018-01-13 LAB — TROPONIN I
TROPONIN I: 0.07 ng/mL — AB (ref <0.03)
Troponin I: 0.07 ng/mL (ref <0.03)

## 2018-01-13 LAB — VANCOMYCIN, TROUGH: VANCOMYCIN TR: 8 ug/mL — AB (ref 15–20)

## 2018-01-13 LAB — PROCALCITONIN: PROCALCITONIN: 1.3 ng/mL

## 2018-01-13 MED ORDER — INSULIN ASPART 100 UNIT/ML ~~LOC~~ SOLN
0.0000 [IU] | Freq: Three times a day (TID) | SUBCUTANEOUS | Status: DC
  Administered 2018-01-13: 3 [IU] via SUBCUTANEOUS
  Administered 2018-01-15: 2 [IU] via SUBCUTANEOUS
  Administered 2018-01-15 – 2018-01-18 (×5): 3 [IU] via SUBCUTANEOUS
  Administered 2018-01-18: 2 [IU] via SUBCUTANEOUS
  Administered 2018-01-19: 5 [IU] via SUBCUTANEOUS
  Administered 2018-01-19: 2 [IU] via SUBCUTANEOUS
  Administered 2018-01-20: 3 [IU] via SUBCUTANEOUS
  Administered 2018-01-20: 5 [IU] via SUBCUTANEOUS
  Administered 2018-01-21: 2 [IU] via SUBCUTANEOUS
  Administered 2018-01-21: 3 [IU] via SUBCUTANEOUS
  Administered 2018-01-22: 5 [IU] via SUBCUTANEOUS
  Administered 2018-01-22: 2 [IU] via SUBCUTANEOUS
  Administered 2018-01-23 (×2): 3 [IU] via SUBCUTANEOUS
  Administered 2018-01-24: 2 [IU] via SUBCUTANEOUS
  Administered 2018-01-24: 3 [IU] via SUBCUTANEOUS

## 2018-01-13 MED ORDER — IOHEXOL 300 MG/ML  SOLN
100.0000 mL | Freq: Once | INTRAMUSCULAR | Status: AC | PRN
  Administered 2018-01-13: 100 mL via INTRAVENOUS

## 2018-01-13 MED ORDER — POTASSIUM CHLORIDE CRYS ER 20 MEQ PO TBCR
40.0000 meq | EXTENDED_RELEASE_TABLET | Freq: Once | ORAL | Status: AC
  Administered 2018-01-13: 40 meq via ORAL
  Filled 2018-01-13: qty 2

## 2018-01-13 MED ORDER — FOLIC ACID 1 MG PO TABS
1.0000 mg | ORAL_TABLET | Freq: Every day | ORAL | Status: DC
  Administered 2018-01-13 – 2018-01-24 (×12): 1 mg via ORAL
  Filled 2018-01-13 (×13): qty 1

## 2018-01-13 MED ORDER — VANCOMYCIN HCL 10 G IV SOLR
1250.0000 mg | Freq: Three times a day (TID) | INTRAVENOUS | Status: DC
  Administered 2018-01-13 – 2018-01-15 (×5): 1250 mg via INTRAVENOUS
  Filled 2018-01-13 (×7): qty 1250

## 2018-01-13 MED ORDER — TBO-FILGRASTIM 300 MCG/0.5ML ~~LOC~~ SOSY
300.0000 ug | PREFILLED_SYRINGE | Freq: Once | SUBCUTANEOUS | Status: AC
Start: 2018-01-13 — End: 2018-01-13
  Administered 2018-01-13: 300 ug via SUBCUTANEOUS
  Filled 2018-01-13: qty 0.5

## 2018-01-13 MED ORDER — ASPIRIN 81 MG PO CHEW
81.0000 mg | CHEWABLE_TABLET | Freq: Every day | ORAL | Status: DC
  Administered 2018-01-13 – 2018-01-24 (×13): 81 mg via ORAL
  Filled 2018-01-13 (×13): qty 1

## 2018-01-13 MED ORDER — PREGABALIN 75 MG PO CAPS
75.0000 mg | ORAL_CAPSULE | Freq: Two times a day (BID) | ORAL | Status: DC
  Administered 2018-01-13 – 2018-01-24 (×23): 75 mg via ORAL
  Filled 2018-01-13 (×23): qty 1

## 2018-01-13 MED ORDER — PANTOPRAZOLE SODIUM 40 MG PO TBEC
40.0000 mg | DELAYED_RELEASE_TABLET | Freq: Every day | ORAL | Status: DC
  Administered 2018-01-13 – 2018-01-17 (×5): 40 mg via ORAL
  Filled 2018-01-13 (×6): qty 1

## 2018-01-13 MED ORDER — IBUPROFEN 400 MG PO TABS
400.0000 mg | ORAL_TABLET | Freq: Four times a day (QID) | ORAL | Status: DC | PRN
  Administered 2018-01-13 (×2): 400 mg via ORAL
  Filled 2018-01-13 (×2): qty 2
  Filled 2018-01-13: qty 1

## 2018-01-13 MED ORDER — IOPAMIDOL (ISOVUE-300) INJECTION 61%
INTRAVENOUS | Status: AC
  Administered 2018-01-13: 14:00:00
  Filled 2018-01-13: qty 30

## 2018-01-13 MED ORDER — INSULIN ASPART 100 UNIT/ML ~~LOC~~ SOLN
0.0000 [IU] | Freq: Every day | SUBCUTANEOUS | Status: DC
  Administered 2018-01-21 – 2018-01-24 (×2): 2 [IU] via SUBCUTANEOUS

## 2018-01-13 MED ORDER — OXYCODONE-ACETAMINOPHEN 5-325 MG PO TABS
1.0000 | ORAL_TABLET | Freq: Four times a day (QID) | ORAL | Status: DC | PRN
  Administered 2018-01-13 – 2018-01-24 (×24): 1 via ORAL
  Filled 2018-01-13 (×24): qty 1

## 2018-01-13 MED ORDER — VITAMIN C 500 MG PO TABS
500.0000 mg | ORAL_TABLET | Freq: Every day | ORAL | Status: DC
  Administered 2018-01-13 – 2018-01-24 (×12): 500 mg via ORAL
  Filled 2018-01-13 (×12): qty 1

## 2018-01-13 NOTE — Progress Notes (Signed)
eLink Physician-Brief Progress Note Patient Name: Joseph [name] DOB: 18-Feb-1951 MRN: [id number]   Date of Service  01/13/2018  HPI/Events of Note  Multiple issues: 1. Troponin #1 = 0.07 and 2. Now c/o pleuritic chest pain - Creatinine = 0.54.   eICU Interventions  Will order: 1. ASA 81 mg PO now and Q day. 2. Continue to trend Troponin. 3. Motrin 400 mg PO now and Q 6 hours PRN pleuritic pain.      Intervention Category Major Interventions: Other:  Lenell Antu 01/13/2018, 12:10 AM

## 2018-01-13 NOTE — Progress Notes (Signed)
Pharmacy Antibiotic Note  Joseph [name] is a 67 y.o. male admitted on 01/11/2018 with sepsis.  Pharmacy has been consulted for vancomycin dosing. Blood culture positive for Enterococcus faecalis and Methicillin-resistant staph species.   Vancomycin trough is subtherapeutic at 8 on 1250 mg IV every 12 hours.  Neutropenic and history of RA receiving G-CSF.   Plan: Increase Vancomycin to 1250 mg IV every 8 hours.  F/u renal function, cultures and clinical course.  Height: 6\' 2"  (188 cm) Weight: 214 lb 15.2 oz (97.5 kg) IBW/kg (Calculated) : 82.2  Temp (24hrs), Avg:98.5 F (36.9 C), Min:97.7 F (36.5 C), Max:99.1 F (37.3 C)  Recent Labs  Lab 01/08/18 0404 01/09/18 0505 01/11/18 0014 01/11/18 0050  01/11/18 0715 01/11/18 1017 01/11/18 1312 01/11/18 2001 01/11/18 2234 01/12/18 0654 01/13/18 0355 01/13/18 1353  WBC 6.1 7.2 5.8  --   --   --   --   --   --   --  4.0 2.9*  --   CREATININE 0.54* 0.65 0.92 0.70  --   --   --   --   --   --  0.54* 0.51* 0.65  LATICACIDVEN  --   --   --   --    < > 3.0* 3.0* 2.8* 2.5* 2.4*  --   --   --   VANCOTROUGH  --   --   --   --   --   --   --   --   --   --   --   --  8*   < > = values in this interval not displayed.    Estimated Creatinine Clearance: 107 mL/min (by C-G formula based on SCr of 0.65 mg/dL).    No Known Allergies  5/19 Blood culture E. Faecalis; GPC (BC[facility] -MRSE) 5/19 urine culture pending   Thank you for allowing pharmacy to be a part of this patient's care.  6/19, PharmD, BCPS, BCCCP Clinical Pharmacist Clinical phone 01/13/2018 until 3:30PM [phone] After hours, please call #[phone] 01/13/2018 3:00 PM

## 2018-01-13 NOTE — Progress Notes (Signed)
Pt c/o of chest pain/pressure on left side. EKG showed NSR with PVC's, informed Dr. Arsenio Loader. Order given for nitro SL and to trend troponins. After one dose of Nitro SL pt states relief in chest. Nurse called back to room 2 hrs later, pt c/o of chest pain when breathing in and out. Notified Dr  Arsenio Loader of new chest pain and troponin 0.07. Order for pt to receive Aspirin and motrin.

## 2018-01-13 NOTE — Care Management Note (Addendum)
Case Management Note  Patient Details  Name: Joseph [name] MRN: [id number] Date of Birth: August 02, 1951  Subjective/Objective:     Pt admitted with bacteremia - pt is immunocompromised due to rheumatoid arthritis            Action/Plan:   PTA pt lived at home with wife, has home 02 at baseline.  Pt set up with [facility] during last admit, however agency was unable to reach pt post discharge- agency will stop by pts room to ensure they have the best contact information (RN and PT).    Pt now requiring IV antibiotics.  CM will continue to follow for discharge needs   Expected Discharge Date:                  Expected Discharge Plan:  Home w Hospice Care  In-House Referral:     Discharge planning Services  CM Consult  Post Acute Care Choice:    Choice offered to:     DME Arranged:    DME Agency:     HH Arranged:    HH Agency:     Status of Service:     If discussed at [employer], dates discussed:    Additional Comments:  Cherylann Parr, RN 01/13/2018, 4:09 PM

## 2018-01-13 NOTE — Progress Notes (Signed)
Pt is requesting for CCM to contact Rheumatologist Dr. Annita Brod at [facility] at [phone] to initiate transfer to [facility].

## 2018-01-13 NOTE — Progress Notes (Signed)
PULMONARY / CRITICAL CARE MEDICINE   Name: Joseph [name] MRN: [id number] DOB: 09-May-1951    ADMISSION DATE:  01/11/2018 CONSULTATION DATE:  01/11/2018  REFERRING MD:  Dr. Blinda Leatherwood   CHIEF COMPLAINT:  Febrile   HISTORY OF PRESENT ILLNESS:   67 yo male with hx of RA with BM suppression from IV MTX, Severe bullous emphysema with infected bulla, and recurrent infections was in hospital in April for infected bulla, May for neutropenia.  Was seen at [facility] by Hematology and Rheumatology.  Started on granix.  Developed fever, sepsis, altered mental status and readmitted.  Found to have enterococcal bacteremia with recurrent diarrhea.  SUBJECTIVE:  C/o pleuritic pain in Lt chest.  Diarrhea improved.    VITAL SIGNS: BP 125/64   Pulse 71   Temp 98.8 F (37.1 C) (Oral)   Resp 19   Ht 6\' 2"  (1.88 m)   Wt 214 lb 15.2 oz (97.5 kg)   SpO2 100%   BMI 27.60 kg/m   INTAKE / OUTPUT: I/O last 3 completed shifts: In: 4916.7 [P.O.:480; I.V.:3086.7; IV Piggyback:1350] Out: ; Stool:100]  PHYSICAL EXAMINATION:  General - pleasant Eyes - pupils reactive ENT - wearing oxygen Cardiac - regular, no murmur Chest - decreased BS, no wheeze Abd - soft, non tender Ext - extensive changes of RA Skin - no rashes Neuro - normal strength  LABS:  BMET Recent Labs  Lab 01/11/18 0014 01/11/18 0050 01/12/18 0654 01/13/18 0355  NA 136 136 144 141  K 3.6 3.6 2.9* 3.3*  CL 97* 97* 111 109  CO2 24  --  23 23  BUN 15 17 9 11   CREATININE 0.92 0.70 0.54* 0.51*  GLUCOSE 185* 185* 149* 126*   Electrolytes Recent Labs  Lab 01/08/18 0404 01/09/18 0505 01/11/18 0014 01/12/18 0654 01/13/18 0355  CALCIUM 9.3 8.9 9.3 8.3* 8.4*  MG 1.5* 1.8  --  1.6*  --   PHOS  --   --   --  3.6  --    CBC Recent Labs  Lab 01/11/18 0014 01/11/18 0050 01/12/18 0654 01/13/18 0355  WBC 5.8  --  4.0 2.9*  HGB 14.8 15.3 10.0* 8.9*  HCT 45.1 45.0 30.4* 27.6*  PLT 218  --  145* 121*    Coag's Recent Labs  Lab 01/11/18 0014  INR 1.02   Sepsis Markers Recent Labs  Lab 01/11/18 0442  01/11/18 1312 01/11/18 2001 01/11/18 2234 01/12/18 0654 01/13/18 0355  LATICACIDVEN  --    < > 2.8* 2.5* 2.4*  --   --   PROCALCITON 1.44  --   --   --   --  2.07 1.30   < > = values in this interval not displayed.   ABG No results for input(s): PHART, PCO2ART, PO2ART in the last 168 hours.  Liver Enzymes Recent Labs  Lab 01/07/18 0341 01/11/18 0014  AST 17 32  ALT 16* 23  ALKPHOS 60 119  BILITOT 0.8 1.0  ALBUMIN 2.6* 3.5   Cardiac Enzymes Recent Labs  Lab 01/12/18 2214 01/13/18 0355  TROPONINI 0.07* 0.07*    Glucose Recent Labs  Lab 01/12/18 1528 01/12/18 1932 01/12/18 2319 01/13/18 0347 01/13/18 0801 01/13/18 1147  GLUCAP 179* 172* 146* 126* 115* 151*   Imaging No results found.   STUDIES:  CT Chest 5/19 >  Increased air-fluid level Lt upper lung bulla CT abd/pelvis 5/21 >  CULTURES: Blood 5/19 >> Enterococcus Urine 5/19 >> negative  ANTIBIOTICS: Vancomycin 5/19 >> Zosyn  5/19 >> 5/21  SIGNIFICANT EVENTS: 5/19 Presents to ED  5/21 Transfer to SDU  LINES/TUBES:  DISCUSSION: 67 yo male with recurrent infections in setting of RA, BM suppression from MTX, severe bullous emphysema with infected bulla.  Has recurrent diarrhea, and Enterococcal bacteremia.  ASSESSMENT / PLAN:  Sepsis from enterococcal bacteremia. - narrow ABx to vancomycin - f/u CT abd/pelvis  Pleurisy from Lt upper lung bullitis. Bullous emphysema. - adjust pain meds - continue abx - continue brovana, pulmicort, duoneb  Acute on chronic hypoxic/hypercapnic respiratory failure. - oxygen to keep SpO2 90 to 95%  A fib with RVR. - likely from acute stress in setting of septic shock - resolved - d/c cardizem  Hypokalemia. - replace electrolytes as needed  Anemia of chronic disease and critical illness. Neutropenia. - f/u CBC - give dose of granix  5/21  Diarrhea. Hx of gastroparesis. - advance diet as able  Advanced rheumatoid arthritis. - continue prednisone   DM. - SSI  DVT prophylaxis - SQ heparin SUP - protonix Nutrition - carb modified/heart healthy Goals of care - full code  Updated pt's wife at bedside  Transfer to SDU 5/21.  [facility] to assume primary care 5/22 and PCCM will follow as consult.  [name], MD [facility] Pulmonary/Critical Care 01/13/2018, 12:27 PM

## 2018-01-13 NOTE — Progress Notes (Signed)
CRITICAL VALUE ALERT  Critical Value: Troponin 0.07  Date & Time Notied: 01/13/18 2343  Provider Notified: Dr. Arsenio Loader  Orders Received/Actions taken: Aspirin and trend troponins

## 2018-01-13 NOTE — Progress Notes (Signed)
Pt transferred to and from CT without incident. Pt transferred on cardiac monitor. Pt brought back to room with continued monitoring by cardiac monitor, pulse ox and BP cuff. No needs at this time.

## 2018-01-14 ENCOUNTER — Other Ambulatory Visit (HOSPITAL_COMMUNITY): Payer: Medicare HMO

## 2018-01-14 DIAGNOSIS — J9622 Acute and chronic respiratory failure with hypercapnia: Secondary | ICD-10-CM

## 2018-01-14 DIAGNOSIS — J431 Panlobular emphysema: Secondary | ICD-10-CM

## 2018-01-14 DIAGNOSIS — E118 Type 2 diabetes mellitus with unspecified complications: Secondary | ICD-10-CM

## 2018-01-14 DIAGNOSIS — M05711 Rheumatoid arthritis with rheumatoid factor of right shoulder without organ or systems involvement: Secondary | ICD-10-CM

## 2018-01-14 DIAGNOSIS — A4181 Sepsis due to Enterococcus: Principal | ICD-10-CM

## 2018-01-14 LAB — GLUCOSE, CAPILLARY
GLUCOSE-CAPILLARY: 105 mg/dL — AB (ref 65–99)
Glucose-Capillary: 87 mg/dL (ref 65–99)
Glucose-Capillary: 115 mg/dL — ABNORMAL HIGH (ref 65–99)
Glucose-Capillary: 165 mg/dL — ABNORMAL HIGH (ref 65–99)

## 2018-01-14 LAB — CBC WITH DIFFERENTIAL/PLATELET
BASOS PCT: 0 %
Basophils Absolute: 0 10*3/uL (ref 0.0–0.1)
EOS PCT: 1 %
Eosinophils Absolute: 0.1 10*3/uL (ref 0.0–0.7)
HCT: 27.6 % — ABNORMAL LOW (ref 39.0–52.0)
Hemoglobin: 8.7 g/dL — ABNORMAL LOW (ref 13.0–17.0)
Lymphocytes Relative: 14 %
Lymphs Abs: 0.8 10*3/uL (ref 0.7–4.0)
MCH: 26.7 pg (ref 26.0–34.0)
MCHC: 31.5 g/dL (ref 30.0–36.0)
MCV: 84.7 fL (ref 78.0–100.0)
MONO ABS: 0.4 10*3/uL (ref 0.1–1.0)
Monocytes Relative: 7 %
NEUTROS PCT: 78 %
Neutro Abs: 4.5 10*3/uL (ref 1.7–7.7)
Platelets: 107 10*3/uL — ABNORMAL LOW (ref 150–400)
RBC: 3.26 MIL/uL — ABNORMAL LOW (ref 4.22–5.81)
RDW: 16.5 % — ABNORMAL HIGH (ref 11.5–15.5)
WBC: 5.8 10*3/uL (ref 4.0–10.5)

## 2018-01-14 LAB — BASIC METABOLIC PANEL
Anion gap: 9 (ref 5–15)
BUN: 11 mg/dL (ref 6–20)
CHLORIDE: 109 mmol/L (ref 101–111)
CO2: 21 mmol/L — AB (ref 22–32)
Calcium: 8.2 mg/dL — ABNORMAL LOW (ref 8.9–10.3)
Creatinine, Ser: 0.52 mg/dL — ABNORMAL LOW (ref 0.61–1.24)
GFR calc non Af Amer: >60 mL/min (ref >60)
Glucose, Bld: 99 mg/dL (ref 65–99)
Potassium: 3 mmol/L — ABNORMAL LOW (ref 3.5–5.1)
Sodium: 139 mmol/L (ref 135–145)

## 2018-01-14 LAB — MAGNESIUM: MAGNESIUM: 1.7 mg/dL (ref 1.7–2.4)

## 2018-01-14 LAB — POTASSIUM: POTASSIUM: 3.3 mmol/L — AB (ref 3.5–5.1)

## 2018-01-14 MED ORDER — POTASSIUM CHLORIDE CRYS ER 20 MEQ PO TBCR
30.0000 meq | EXTENDED_RELEASE_TABLET | ORAL | Status: AC
  Administered 2018-01-14 (×2): 30 meq via ORAL
  Filled 2018-01-14 (×2): qty 1

## 2018-01-14 NOTE — Progress Notes (Signed)
Pt. had medium soft mushy stool and pushed flexiseal out, and pt. didn't want it back in and stool too thick to go through tube.

## 2018-01-14 NOTE — Progress Notes (Signed)
PROGRESS NOTE    Joseph [name]  WSF:[id number] DOB: 04-16-1951 DOA: 01/11/2018 PCP: Sharyne Richters, MD   Brief Narrative:  67 y.o. WM PMHx HTN HLD, COPD, Chronic respiratory failure on 3 L [place], CAD, Chronic Diastolic CHF Gastroparesis, Psoriasis, Rheumatoid arthritis, pancytopenia,   Hospitalized 4/29-5/3 due to HCAP and discharged home on Augmentin.  He developed severe diarrhea the following day w/ more than 20 watery bowel movements and a fever of 102.    ED w/u revealed WBC 4.5, lactic acid 1.58, negative urinalysis, negative C. difficile antigen toxin, temperature 102.8, hypotensive with blood pressure 87/59, tachypnea, oxygen saturation 100% on 3 L nasal cannula oxygen.  Chest x-ray showed known left upper lobe thick-walled cavitary lesion with increased size and increased surrounding airspace consolidation. Patient was discharged on 5/17 to follow-up at [facility] Hematology clinic  Presented 5/19 with AMS and fever. Upon arrival Temp 105. Patient ws recently discharged 5/17 and then followed up at [facility] Hematology. Wife reports he was doing well until this afternoon when he seemed to be confused as well as having diarrhea. Upon arrival patient is hypotensive, tachycardiac. LA 3.64, received 3L of normal saline, repeat 8. PCCM asked to admit.        Subjective: 5/22 A /O x4, negative acute S OB, negative CP, negative abdominal pain.  States saw his Hematologist at [facility] Dr. Su Grand [phone] who wanted him to obtain a bone biopsy on Monday unfortunately patient was readmitted on Saturday with sepsis.  In addition per patient Dr. Deniece Ree [facility] Rheumatology [phone] wanted patient to obtain a battery of tests (unsure of exactly the type).    Assessment & Plan:   Active Problems:   Sepsis ([place])   Sepsis/Enterococcal bacteremia -Complete 14-day course of antibiotics. -CT abdomen pelvis nondiagnostic for cause of enterococcal bacteremia. -F/u CT abd/pelvis     Pleurisy from Lt upper lung bullitis. Bullous emphysema. - adjust pain meds - continue abx - continue brovana, pulmicort, duoneb   Acute on Chronic Respiratory failure with Hypoxia and Hypercapnia/Bullous Emphysema - Titrate O2 to maintain SPO2 89 to 93% - Brovana 15 mcg BID -Pulmicort 0.5 mg BID - DuoNeb BID    Chronic diastolic CHF -strict -Daily weight - Transfuse for hemoglobin<8  A. fib with RVR -Likely from septic shock -Currently NSR - Patient has been on Cardizem but has been DC'd.    Hypokalemia - Potassium goal> 4 - K-Dur 60 mEq -Recheck K/Mg @ 1600   Anemia of Chronic Disease/Critical illness    Neutropenia -Received dose of Granix on 5/21 - We will contact Hematologist at [facility] Dr. Su Grand [phone], per patient and wife at schedule patient for bone marrow biopsy on Monday 5/20 unfortunately patient was hospitalized.  Determine what labs need to be drawn.  Rheumatoid Arthritis - We will contact Dr. Deniece Ree [facility] Rheumatology [phone]: Per patient and wife have been trying to start work-up however patient constantly admitted to hospital.  Can we obtain labs while hospitalized? -Continue prednisone 15 mg daily until case discussed with rheumatology   Diarrhea  -Currently no complaints - Hx gastroparesis. 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 -Small left pleural effusion.- Bilateral nonobstructive  nephrolithiasis. -Aortic Atherosclerosis-Emphysema .    I have personally reviewed and interpreted all radiology studies and my findings are as above.  VENTILATOR SETTINGS:    Cultures 5/19 blood coag negative staph methicillin-resistant Positive 2/2 5/19 positive enterococcus 5/19 urine negative 5/20 blood NGTD 5/21 blood pending     Antimicrobials: Anti-infectives (From admission, onward)   Start     Stop   01/13/18 2255  vancomycin (VANCOCIN) 1,250 mg in sodium chloride 0.9 % 250 mL IVPB         01/12/18 0800  piperacillin-tazobactam (ZOSYN) IVPB 3.375 g  Status:  Discontinued     01/13/18 1218   01/11/18 1400  vancomycin (VANCOCIN) 1,250 mg in sodium chloride 0.9 % 250 mL IVPB  Status:  Discontinued     01/13/18 1512   01/11/18 0045  vancomycin (VANCOCIN) IVPB 750 mg/150 ml premix     01/11/18 0218   01/11/18 0030  piperacillin-tazobactam (ZOSYN) IVPB 3.375 g     01/11/18 0105   01/11/18 0030  vancomycin (VANCOCIN) IVPB 1000 mg/200 mL premix     01/11/18 0146       Devices    LINES / TUBES:      Continuous Infusions: . sodium chloride 100 mL/hr at 01/12/18 2353  . vancomycin 1,250 mg (01/14/18 0601)     Objective: Vitals:   01/14/18 0345 01/14/18 0400 01/14/18 0500 01/14/18 0600  BP:  110/64 127/67 134/75  Pulse:  (!) 57 63 79  Resp:  12 11 (!) 24  Temp: (!) 97.5 F (36.4 C)     TempSrc: Oral     SpO2:  100% 100% 99%  Weight:   214 lb 15.2 oz (97.5 kg)   Height:        Intake/Output Summary (Last 24 hours) at 01/14/2018 0728 Last data filed at 01/14/2018 0600 Gross per 24 hour  Intake 3000 ml  Output 2550 ml  Net 450 ml   Filed Weights   01/12/18 0500 01/13/18 0403 01/14/18 0500  Weight: 244 lb 7.8 oz (110.9 kg) 214 lb 15.2 oz (97.5 kg) 214 lb 15.2 oz (97.5 kg)    Physical Exam:  General: A/O x4, positive chronic respiratory distress Neck:  Negative scars, masses, torticollis, lymphadenopathy, JVD Lungs: mild bibasilar crackles,  negative wheeze, negative rhonchi.   Cardiovascular: Regular rate and rhythm without murmur gallop or rub normal S1 and S2 Abdomen: negative abdominal pain, nondistended, positive soft, bowel sounds, no rebound, no ascites, no appreciable mass Extremities: bilateral upper/bilateral lower extremity deformity secondary to arthritis Skin: Negative rashes, lesions, ulcers Psychiatric:  Negative depression, negative anxiety, negative fatigue, negative mania  Central nervous system:  Cranial nerves II through XII intact, tongue/uvula midline, all extremities muscle strength 5/5, sensation intact throughout, negative dysarthria, negative expressive aphasia, negative receptive aphasia. .     Data Reviewed: Care during the described time interval was provided by me .  I have reviewed this patient's available data, including medical history, events of note, physical examination, and all test results as part of my evaluation.   CBC: Recent Labs  Lab 01/08/18 0404 01/09/18 0505 01/11/18 0014 01/11/18 0050 01/12/18 0654 01/13/18 0355 01/14/18 0240  WBC 6.1 7.2 5.8  --  4.0 2.9* 5.8  NEUTROABS 3.4 4.0 1.7  --   --  1.4*  4.5  HGB 11.2* 11.0* 14.8 15.3 10.0* 8.9* 8.7*  HCT 34.7* 34.2* 45.1 45.0 30.4* 27.6* 27.6*  MCV 83.6 85.1 83.4  --  84.2 84.7 84.7  PLT 281 244 218  --  145* 121* 453*   Basic Metabolic Panel: Recent Labs  Lab 01/08/18 0404 01/09/18 0505 01/11/18 0014 01/11/18 0050 01/12/18 0654 01/13/18 0355 01/13/18 1353 01/14/18 0240  NA 139 138 136 136 144 141 141 139  K 3.8 3.9 3.6 3.6 2.9* 3.3* 3.3* 3.0*  CL 102 101 97* 97* 111 109 111 109  CO2 _0 --  23 23 21* 21*  GLUCOSE 91 91 185* 185* 149* 126* 153* 99  BUN _1 CREATININE 0.54* 0.65 0.92 0.70 0.54* 0.51* 0.65 0.52*  CALCIUM 9.3 8.9 9.3  --  8.3* 8.4* 8.4* 8.2*  MG 1.5* 1.8  --   --  1.6*  --   --   --   PHOS  --   --   --   --  3.6  --   --   --    GFR: Estimated Creatinine Clearance: 107  mL/min (A) (by C-G formula based on SCr of 0.52 mg/dL (L)). Liver Function Tests: Recent Labs  Lab 01/11/18 0014  AST 32  ALT 23  ALKPHOS 119  BILITOT 1.0  PROT 7.3  ALBUMIN 3.5   No results for input(s): LIPASE, AMYLASE in the last 168 hours. No results for input(s): AMMONIA in the last 168 hours. Coagulation Profile: Recent Labs  Lab 01/11/18 0014  INR 1.02   Cardiac Enzymes: Recent Labs  Lab 01/12/18 2214 01/13/18 0355 01/13/18 0958  TROPONINI 0.07* 0.07* 0.07*   BNP (last 3 results) No results for input(s): PROBNP in the last 8760 hours. HbA1C: No results for input(s): HGBA1C in the last 72 hours. CBG: Recent Labs  Lab 01/13/18 0347 01/13/18 0801 01/13/18 1147 01/13/18 1607 01/13/18 2202  GLUCAP 126* 115* 151* 172* 154*   Lipid Profile: No results for input(s): CHOL, HDL, LDLCALC, TRIG, CHOLHDL, LDLDIRECT in the last 72 hours. Thyroid Function Tests: No results for input(s): TSH, T4TOTAL, FREET4, T3FREE, THYROIDAB in the last 72 hours. Anemia Panel: No results for input(s): VITAMINB12, FOLATE, FERRITIN, TIBC, IRON, RETICCTPCT in the last 72 hours. Urine analysis:    Component Value Date/Time   COLORURINE YELLOW 01/11/2018 0225   APPEARANCEUR CLEAR 01/11/2018 0225   LABSPEC 1.013 01/11/2018 0225   PHURINE 5.0 01/11/2018 0225   GLUCOSEU NEGATIVE 01/11/2018 0225   HGBUR NEGATIVE 01/11/2018 0225   HGBUR negative 04/12/2008 1103   BILIRUBINUR NEGATIVE 01/11/2018 0225   KETONESUR NEGATIVE 01/11/2018 0225   PROTEINUR NEGATIVE 01/11/2018 0225   UROBILINOGEN 0.2 10/17/2012 1236   NITRITE NEGATIVE 01/11/2018 0225   LEUKOCYTESUR NEGATIVE 01/11/2018 0225   Sepsis Labs: _2 (procalcitonin:4,lacticidven:4)  ) Recent Results (from the past 240 hour(s))  Blood Culture (routine x 2)     Status: Abnormal (Preliminary result)   Collection Time: 01/11/18 12:15 AM  Result Value Ref Range Status   Specimen Description BLOOD LEFT ANTECUBITAL  Final    Special Requests   Final    BOTTLES DRAWN AEROBIC AND ANAEROBIC Blood Culture adequate volume   Culture  Setup Time   Final    GRAM POSITIVE COCCI IN BOTH AEROBIC AND ANAEROBIC BOTTLES CRITICAL RESULT CALLED TO, READ BACK BY AND VERIFIED WITH: ESINCLAIR,PHARMD _3  01/11/18 BY LHOWARD Performed at [facility], [place] [address] [place], [place]  [address]    Culture STAPHYLOCOCCUS SPECIES (COAGULASE NEGATIVE) (A)  Final   Report Status PENDING  Incomplete  Blood Culture ID Panel (Reflexed)     Status: Abnormal   Collection Time: 01/11/18 12:15 AM  Result Value Ref Range Status   Enterococcus species NOT DETECTED NOT DETECTED Final   Listeria monocytogenes NOT DETECTED NOT DETECTED Final   Staphylococcus species DETECTED (A) NOT DETECTED Final    Comment: Methicillin (oxacillin) resistant coagulase negative staphylococcus. Possible blood culture contaminant (unless isolated from more than one blood culture draw or clinical case suggests pathogenicity). No antibiotic treatment is indicated for blood  culture contaminants. CRITICAL RESULT CALLED TO, READ BACK BY AND VERIFIED WITH: ESINCLAIR,PHARMD _0  01/11/18 BY LHOWARD    Staphylococcus aureus NOT DETECTED NOT DETECTED Final   Methicillin resistance DETECTED (A) NOT DETECTED Final    Comment: CRITICAL RESULT CALLED TO, READ BACK BY AND VERIFIED WITH: ESINCLAIR,PHARMD _1  01/11/18 BY LHOWARD    Streptococcus species NOT DETECTED NOT DETECTED Final   Streptococcus agalactiae NOT DETECTED NOT DETECTED Final   Streptococcus pneumoniae NOT DETECTED NOT DETECTED Final   Streptococcus pyogenes NOT DETECTED NOT DETECTED Final   Acinetobacter baumannii NOT DETECTED NOT DETECTED Final   Enterobacteriaceae species NOT DETECTED NOT DETECTED Final   Enterobacter cloacae complex NOT DETECTED NOT DETECTED Final   Escherichia coli NOT DETECTED NOT DETECTED Final   Klebsiella oxytoca NOT DETECTED NOT DETECTED Final   Klebsiella pneumoniae  NOT DETECTED NOT DETECTED Final   Proteus species NOT DETECTED NOT DETECTED Final   Serratia marcescens NOT DETECTED NOT DETECTED Final   Haemophilus influenzae NOT DETECTED NOT DETECTED Final   Neisseria meningitidis NOT DETECTED NOT DETECTED Final   Pseudomonas aeruginosa NOT DETECTED NOT DETECTED Final   Candida albicans NOT DETECTED NOT DETECTED Final   Candida glabrata NOT DETECTED NOT DETECTED Final   Candida krusei NOT DETECTED NOT DETECTED Final   Candida parapsilosis NOT DETECTED NOT DETECTED Final   Candida tropicalis NOT DETECTED NOT DETECTED Final    Comment: Performed at [facility], [place]. [address] [place], [place] [address]  Blood Culture (routine x 2)     Status: Abnormal (Preliminary result)   Collection Time: 01/11/18 12:20 AM  Result Value Ref Range Status   Specimen Description BLOOD RIGHT FOREARM  Final   Special Requests   Final    BOTTLES DRAWN AEROBIC AND ANAEROBIC Blood Culture adequate volume   Culture  Setup Time   Final    GRAM POSITIVE RODS ANAEROBIC BOTTLE ONLY CRITICAL RESULT CALLED TO, READ BACK BY AND VERIFIED WITH: E SINCLAIR,PHARMD AT 2119 01/11/18 BY L BENFIELD GRAM POSITIVE COCCI AEROBIC BOTTLE ONLY CRITICAL VALUE NOTED.  VALUE IS CONSISTENT WITH PREVIOUSLY REPORTED AND CALLED VALUE. Performed at [facility], [place] [address] [place],  [address]    Culture (A)  Final    ENTEROCOCCUS FAECALIS STAPHYLOCOCCUS SPECIES (COAGULASE NEGATIVE)    Report Status PENDING  Incomplete  Blood Culture ID Panel (Reflexed)     Status: None   Collection Time: 01/11/18 12:20 AM  Result Value Ref Range Status   Enterococcus species NOT DETECTED NOT DETECTED Final   Listeria monocytogenes NOT DETECTED NOT DETECTED Final   Staphylococcus species NOT DETECTED NOT DETECTED Final   Staphylococcus aureus NOT DETECTED NOT DETECTED Final   Streptococcus species NOT DETECTED NOT DETECTED Final   Streptococcus agalactiae NOT DETECTED NOT DETECTED Final    Streptococcus pneumoniae NOT DETECTED NOT DETECTED Final  Streptococcus pyogenes NOT DETECTED NOT DETECTED Final   Acinetobacter baumannii NOT DETECTED NOT DETECTED Final   Enterobacteriaceae species NOT DETECTED NOT DETECTED Final   Enterobacter cloacae complex NOT DETECTED NOT DETECTED Final   Escherichia coli NOT DETECTED NOT DETECTED Final   Klebsiella oxytoca NOT DETECTED NOT DETECTED Final   Klebsiella pneumoniae NOT DETECTED NOT DETECTED Final   Proteus species NOT DETECTED NOT DETECTED Final   Serratia marcescens NOT DETECTED NOT DETECTED Final   Haemophilus influenzae NOT DETECTED NOT DETECTED Final   Neisseria meningitidis NOT DETECTED NOT DETECTED Final   Pseudomonas aeruginosa NOT DETECTED NOT DETECTED Final   Candida albicans NOT DETECTED NOT DETECTED Final   Candida glabrata NOT DETECTED NOT DETECTED Final   Candida krusei NOT DETECTED NOT DETECTED Final   Candida parapsilosis NOT DETECTED NOT DETECTED Final   Candida tropicalis NOT DETECTED NOT DETECTED Final    Comment: Performed at [facility], [place] [address], [place], [place] [address]  Urine culture     Status: None   Collection Time: 01/11/18  2:28 AM  Result Value Ref Range Status   Specimen Description URINE, CLEAN CATCH  Final   Special Requests NONE  Final   Culture   Final    NO GROWTH Performed at [facility], [place] [address], [place], [place] [address]    Report Status 01/12/2018 FINAL  Final  Culture, blood (routine x 2)     Status: None (Preliminary result)   Collection Time: 01/12/18  6:40 AM  Result Value Ref Range Status   Specimen Description BLOOD LEFT HAND  Final   Special Requests   Final    BOTTLES DRAWN AEROBIC ONLY Blood Culture adequate volume   Culture   Final    NO GROWTH 1 DAY Performed at [facility], [place] [address] [place], [place] [address]    Report Status PENDING  Incomplete  Culture, blood (routine x 2)     Status: None (Preliminary result)   Collection  Time: 01/12/18  6:49 AM  Result Value Ref Range Status   Specimen Description BLOOD RIGHT HAND  Final   Special Requests   Final    BOTTLES DRAWN AEROBIC ONLY Blood Culture adequate volume   Culture   Final    NO GROWTH 1 DAY Performed at [facility], [place] [address] [place], [place] [address]    Report Status PENDING  Incomplete         Radiology Studies: Ct Abdomen Pelvis W Contrast  Result Date: 01/13/2018 CLINICAL DATA:  Painless diarrhea EXAM: CT ABDOMEN AND PELVIS WITH CONTRAST TECHNIQUE: Multidetector CT imaging of the abdomen and pelvis was performed using the standard protocol following bolus administration of intravenous contrast. CONTRAST:  16m OMNIPAQUE IOHEXOL 300 MG/ML  SOLN COMPARISON:  08/14/2017 FINDINGS: LOWER CHEST: Small left pleural effusion. Emphysema and bibasilar atelectasis. HEPATOBILIARY: Normal hepatic contours and density. No intra- or extrahepatic biliary dilatation. Status post cholecystectomy. PANCREAS: Normal parenchymal contours without ductal dilatation. No peripancreatic fluid collection. SPLEEN: Normal. ADRENALS/URINARY TRACT: --Adrenal glands: Normal. --Right kidney/ureter: Multiple nonobstructing renal stones measuring up 2 7 mm. No hydronephrosis. No ureterolithiasis. --Left kidney/ureter: Tiny non-obstructing 1-2 mm stone at the lower pole. No hydronephrosis. --Urinary bladder: Normal for degree of distention STOMACH/BOWEL: --Stomach/Duodenum: No hiatal hernia or other gastric abnormality. Normal duodenal course. --Small bowel: No dilatation or inflammation. --Colon: There is a fecal management system in the rectum. There is sigmoid diverticulosis without acute inflammation. The remainder  of the colon is normal. --Appendix: Normal. VASCULAR/LYMPHATIC: Atherosclerotic calcification is present within the non-aneurysmal abdominal aorta, without hemodynamically significant stenosis. The portal vein, splenic vein, superior mesenteric vein and IVC are  patent. No abdominal or pelvic lymphadenopathy. REPRODUCTIVE: Normal prostate and seminal vesicles. MUSCULOSKELETAL. No bony spinal canal stenosis or focal osseous abnormality. OTHER: None. IMPRESSION: 1. No acute abnormality of the abdomen or pelvis. 2. Small left pleural effusion. 3. Bilateral nonobstructive nephrolithiasis. 4. Aortic Atherosclerosis (ICD10-I70.0) and Emphysema (ICD10-J43.9). Electronically Signed   By: Ulyses Jarred M.D.   On: 01/13/2018 17:57        Scheduled Meds: . arformoterol  15 mcg Nebulization BID  . aspirin  81 mg Oral Daily  . budesonide (PULMICORT) nebulizer solution  0.5 mg Nebulization BID  . Chlorhexidine Gluconate Cloth  6 each Topical Q0600  . folic acid  1 mg Oral Daily  . heparin  5,000 Units Subcutaneous Q8H  . insulin aspart  0-15 Units Subcutaneous TID WC  . insulin aspart  0-5 Units Subcutaneous QHS  . ipratropium-albuterol  3 mL Nebulization BID  . mouth rinse  15 mL Mouth Rinse BID  . mupirocin ointment  1 application Nasal BID  . pantoprazole  40 mg Oral Daily  . potassium chloride  30 mEq Oral Q4H  . predniSONE  15 mg Oral Q breakfast  . pregabalin  75 mg Oral BID  . vitamin C  500 mg Oral Daily   Continuous Infusions: . sodium chloride 100 mL/hr at 01/12/18 2353  . vancomycin 1,250 mg (01/14/18 0601)     LOS: 3 days    Time spent: 40 minutes    WOODS, Geraldo Docker, MD [facility] Pager [phone]   If 7PM-7AM, please contact night-coverage [email or web address] Password [facility] 01/14/2018, 7:28 AM

## 2018-01-14 NOTE — Progress Notes (Signed)
eLink Physician-Brief Progress Note Patient Name: Joseph [name] DOB: 12-Aug-1951 MRN: [id number]   Date of Service  01/14/2018  HPI/Events of Note  K+ = 3.0 and Creatinine = 0.52.  eICU Interventions  Will replace K+.     Intervention Category Major Interventions: Electrolyte abnormality - evaluation and management  Victoriano Campion [place] 01/14/2018, 4:02 AM

## 2018-01-15 ENCOUNTER — Other Ambulatory Visit: Payer: Self-pay

## 2018-01-15 ENCOUNTER — Inpatient Hospital Stay (HOSPITAL_COMMUNITY): Payer: Medicare HMO

## 2018-01-15 ENCOUNTER — Telehealth: Payer: Self-pay | Admitting: Cardiology

## 2018-01-15 DIAGNOSIS — R7881 Bacteremia: Secondary | ICD-10-CM

## 2018-01-15 DIAGNOSIS — F320 Major depressive disorder, single episode, mild: Secondary | ICD-10-CM

## 2018-01-15 DIAGNOSIS — Z9981 Dependence on supplemental oxygen: Secondary | ICD-10-CM

## 2018-01-15 DIAGNOSIS — L409 Psoriasis, unspecified: Secondary | ICD-10-CM

## 2018-01-15 DIAGNOSIS — B9689 Other specified bacterial agents as the cause of diseases classified elsewhere: Secondary | ICD-10-CM

## 2018-01-15 DIAGNOSIS — Z8249 Family history of ischemic heart disease and other diseases of the circulatory system: Secondary | ICD-10-CM

## 2018-01-15 DIAGNOSIS — Z79899 Other long term (current) drug therapy: Secondary | ICD-10-CM

## 2018-01-15 DIAGNOSIS — E785 Hyperlipidemia, unspecified: Secondary | ICD-10-CM

## 2018-01-15 DIAGNOSIS — D61818 Other pancytopenia: Secondary | ICD-10-CM

## 2018-01-15 DIAGNOSIS — Z87891 Personal history of nicotine dependence: Secondary | ICD-10-CM

## 2018-01-15 DIAGNOSIS — Z7952 Long term (current) use of systemic steroids: Secondary | ICD-10-CM

## 2018-01-15 DIAGNOSIS — K3184 Gastroparesis: Secondary | ICD-10-CM

## 2018-01-15 DIAGNOSIS — G4709 Other insomnia: Secondary | ICD-10-CM

## 2018-01-15 DIAGNOSIS — I110 Hypertensive heart disease with heart failure: Secondary | ICD-10-CM

## 2018-01-15 DIAGNOSIS — B952 Enterococcus as the cause of diseases classified elsewhere: Secondary | ICD-10-CM

## 2018-01-15 DIAGNOSIS — J181 Lobar pneumonia, unspecified organism: Secondary | ICD-10-CM

## 2018-01-15 DIAGNOSIS — I5032 Chronic diastolic (congestive) heart failure: Secondary | ICD-10-CM

## 2018-01-15 LAB — DIFFERENTIAL
ABS IMMATURE GRANULOCYTES: 0.2 10*3/uL — AB (ref 0.0–0.1)
BASOS PCT: 0 %
Basophils Absolute: 0 10*3/uL (ref 0.0–0.1)
Eosinophils Absolute: 0 10*3/uL (ref 0.0–0.7)
Eosinophils Relative: 0 %
Immature Granulocytes: 1 %
LYMPHS ABS: 0.5 10*3/uL — AB (ref 0.7–4.0)
Lymphocytes Relative: 5 %
MONO ABS: 0.3 10*3/uL (ref 0.1–1.0)
Monocytes Relative: 2 %
NEUTROS ABS: 10.2 10*3/uL — AB (ref 1.7–7.7)
NEUTROS PCT: 92 %

## 2018-01-15 LAB — CBC
HEMATOCRIT: 30.2 % — AB (ref 39.0–52.0)
Hemoglobin: 9.7 g/dL — ABNORMAL LOW (ref 13.0–17.0)
MCH: 27.2 pg (ref 26.0–34.0)
MCHC: 32.1 g/dL (ref 30.0–36.0)
MCV: 84.6 fL (ref 78.0–100.0)
PLATELETS: 141 10*3/uL — AB (ref 150–400)
RBC: 3.57 MIL/uL — AB (ref 4.22–5.81)
RDW: 16.4 % — AB (ref 11.5–15.5)
WBC: 10.9 10*3/uL — ABNORMAL HIGH (ref 4.0–10.5)

## 2018-01-15 LAB — BASIC METABOLIC PANEL
ANION GAP: 10 (ref 5–15)
BUN: 8 mg/dL (ref 6–20)
CHLORIDE: 105 mmol/L (ref 101–111)
CO2: 26 mmol/L (ref 22–32)
Calcium: 8.5 mg/dL — ABNORMAL LOW (ref 8.9–10.3)
Creatinine, Ser: 0.47 mg/dL — ABNORMAL LOW (ref 0.61–1.24)
GFR calc non Af Amer: >60 mL/min (ref >60)
Glucose, Bld: 122 mg/dL — ABNORMAL HIGH (ref 65–99)
Potassium: 3.4 mmol/L — ABNORMAL LOW (ref 3.5–5.1)
SODIUM: 141 mmol/L (ref 135–145)

## 2018-01-15 LAB — GLUCOSE, CAPILLARY
GLUCOSE-CAPILLARY: 107 mg/dL — AB (ref 65–99)
GLUCOSE-CAPILLARY: 101 mg/dL — AB (ref 65–99)
Glucose-Capillary: 136 mg/dL — ABNORMAL HIGH (ref 65–99)
Glucose-Capillary: 175 mg/dL — ABNORMAL HIGH (ref 65–99)

## 2018-01-15 LAB — POTASSIUM: Potassium: 4 mmol/L (ref 3.5–5.1)

## 2018-01-15 LAB — MAGNESIUM
MAGNESIUM: 1.8 mg/dL (ref 1.7–2.4)
MAGNESIUM: 2 mg/dL (ref 1.7–2.4)

## 2018-01-15 MED ORDER — SODIUM CHLORIDE 0.9 % IV SOLN
3.0000 g | Freq: Four times a day (QID) | INTRAVENOUS | Status: DC
  Filled 2018-01-15 (×2): qty 3

## 2018-01-15 MED ORDER — TRAZODONE HCL 50 MG PO TABS
25.0000 mg | ORAL_TABLET | Freq: Every day | ORAL | Status: DC
  Administered 2018-01-15 – 2018-01-19 (×4): 25 mg via ORAL
  Administered 2018-01-20: 50 mg via ORAL
  Administered 2018-01-21 – 2018-01-23 (×3): 25 mg via ORAL
  Filled 2018-01-15 (×10): qty 1

## 2018-01-15 MED ORDER — DM-GUAIFENESIN ER 30-600 MG PO TB12
1.0000 | ORAL_TABLET | Freq: Two times a day (BID) | ORAL | Status: DC
  Administered 2018-01-15 – 2018-01-24 (×19): 1 via ORAL
  Filled 2018-01-15 (×20): qty 1

## 2018-01-15 MED ORDER — POTASSIUM CHLORIDE CRYS ER 20 MEQ PO TBCR
40.0000 meq | EXTENDED_RELEASE_TABLET | Freq: Three times a day (TID) | ORAL | Status: AC
  Administered 2018-01-15 (×3): 40 meq via ORAL
  Filled 2018-01-15 (×3): qty 2

## 2018-01-15 MED ORDER — POTASSIUM CHLORIDE CRYS ER 20 MEQ PO TBCR
20.0000 meq | EXTENDED_RELEASE_TABLET | ORAL | Status: DC
  Administered 2018-01-15: 20 meq via ORAL
  Filled 2018-01-15: qty 1

## 2018-01-15 MED ORDER — AMPICILLIN-SULBACTAM SODIUM 3 (2-1) G IJ SOLR
3.0000 g | Freq: Four times a day (QID) | INTRAMUSCULAR | Status: DC
  Administered 2018-01-15 – 2018-01-24 (×36): 3 g via INTRAVENOUS
  Filled 2018-01-15 (×40): qty 3

## 2018-01-15 NOTE — Telephone Encounter (Signed)
Routed to MD to make aware 

## 2018-01-15 NOTE — Plan of Care (Signed)
  Problem: Education: Goal: Knowledge of General Education information will improve Outcome: Progressing   

## 2018-01-15 NOTE — Plan of Care (Signed)
  Problem: Education: Goal: Knowledge of General Education information will improve Outcome: Progressing Note:  POC reviewed with pt./wife.   

## 2018-01-15 NOTE — Care Management Note (Signed)
Case Management Note  Patient Details  Name: Joseph [name] MRN: [id number] Date of Birth: [birth year]  Subjective/Objective:     Pt admitted with bacteremia - pt is immunocompromised due to rheumatoid arthritis            Action/Plan:   PTA pt lived at home with wife, has home 02 at baseline.  Pt set up with [facility] during last admit, however agency was unable to reach pt post discharge- agency will stop by pts room to ensure they have the best contact information (RN and PT).    Pt now requiring IV antibiotics.  CM will continue to follow for discharge needs   Expected Discharge Date:                  Expected Discharge Plan:  Home w Hospice Care  In-House Referral:     Discharge planning Services  CM Consult  Post Acute Care Choice:    Choice offered to:     DME Arranged:    DME Agency:     HH Arranged:    HH Agency:     Status of Service:     If discussed at [employer], dates discussed:    Additional Comments: 01/15/2018 [facility] has pts latest information - pt in agreement with resuming agency at discharge.  CM requested resumption orders Cherylann Parr, RN 01/15/2018, 4:37 PM

## 2018-01-15 NOTE — Progress Notes (Signed)
Patient arrived upon unit via hospital bed. He is alert and oriented x4. Patient's wife at bedside. Telemetry was applied, box 11. Patient belongings with patient from the ICU. Patient and family oriented to room. Call bell within reach of patient, and bed alarm on.   Jaynee Eagles, RN

## 2018-01-15 NOTE — Progress Notes (Signed)
[facility] ADULT ICU REPLACEMENT PROTOCOL FOR AM LAB REPLACEMENT ONLY  The patient does apply for the [facility] Adult ICU Electrolyte Replacment Protocol based on the criteria listed below:   1. Is GFR >/= 40 ml/min? Yes.    Patient's GFR today is >60 2. Is urine output >/= 0.5 ml/kg/hr for the last 6 hours? Yes.   Patient's UOP is 2 ml/kg/hr 3. Is BUN < 60 mg/dL? Yes.    Patient's BUN today is 8 4. Abnormal electrolyte(s): K-3.4 5. Ordered repletion with: per protocol 6. If a panic level lab has been reported, has the CCM MD in charge been notified? Yes.  .   Physician:  Dr. Roxy Manns, Dixon Boos 01/15/2018 5:38 AM

## 2018-01-15 NOTE — Progress Notes (Signed)
PULMONARY / CRITICAL CARE MEDICINE   Name: Joseph [name] MRN: [id number] DOB: [birth year]    ADMISSION DATE:  01/11/2018 CONSULTATION DATE:  01/11/2018  REFERRING MD:  Dr. Blinda Leatherwood   CHIEF COMPLAINT:  Febrile   HISTORY OF PRESENT ILLNESS:   67 yo male with hx of RA with BM suppression from IV MTX, Severe bullous emphysema with infected bulla, and recurrent infections was in hospital in April for infected bulla, May for neutropenia.  Was seen at [facility] by Hematology and Rheumatology.  Started on granix.  Developed fever, sepsis, altered mental status and readmitted.  Found to have enterococcal bacteremia with recurrent diarrhea.  SUBJECTIVE:  No acute events.   VITAL SIGNS: BP 125/84   Pulse 79   Temp 97.7 F (36.5 C) (Oral)   Resp 17   Ht 6\' 2"  (1.88 m)   Wt 97.5 kg (214 lb 15.2 oz)   SpO2 100%   BMI 27.60 kg/m   INTAKE / OUTPUT: I/O last 3 completed shifts: In: 2890 [P.O.:840; I.V.:1300; IV Piggyback:750] Out: 8576 [Urine:8575; Stool:1]  PHYSICAL EXAMINATION: General - pleasant male, NAD Eyes - pupils reactive, EOMI ENT - wearing oxygen Cardiac - regular, no murmur Chest - CTAB Abd - soft, non tender Ext - extensive changes of RA Skin - no rashes Neuro - normal strength  LABS:  BMET Recent Labs  Lab 01/13/18 1353 01/14/18 0240 01/14/18 1534 01/15/18 0305  NA 141 139  --  141  K 3.3* 3.0* 3.3* 3.4*  CL 111 109  --  105  CO2 21* 21*  --  26  BUN 10 11  --  8  CREATININE 0.65 0.52*  --  0.47*  GLUCOSE 153* 99  --  122*   Electrolytes Recent Labs  Lab 01/12/18 0654  01/13/18 1353 01/14/18 0240 01/14/18 1534 01/15/18 0305  CALCIUM 8.3*   < > 8.4* 8.2*  --  8.5*  MG 1.6*  --   --   --  1.7 1.8  PHOS 3.6  --   --   --   --   --    < > = values in this interval not displayed.   CBC Recent Labs  Lab 01/13/18 0355 01/14/18 0240 01/15/18 0305  WBC 2.9* 5.8 10.9*  HGB 8.9* 8.7* 9.7*  HCT 27.6* 27.6* 30.2*  PLT 121* 107* 141*   Coag's Recent Labs   Lab 01/11/18 0014  INR 1.02   Sepsis Markers Recent Labs  Lab 01/11/18 0442  01/11/18 1312 01/11/18 2001 01/11/18 2234 01/12/18 0654 01/13/18 0355  LATICACIDVEN  --    < > 2.8* 2.5* 2.4*  --   --   PROCALCITON 1.44  --   --   --   --  2.07 1.30   < > = values in this interval not displayed.   ABG No results for input(s): PHART, PCO2ART, PO2ART in the last 168 hours.  Liver Enzymes Recent Labs  Lab 01/11/18 0014  AST 32  ALT 23  ALKPHOS 119  BILITOT 1.0  ALBUMIN 3.5   Cardiac Enzymes Recent Labs  Lab 01/12/18 2214 01/13/18 0355 01/13/18 0958  TROPONINI 0.07* 0.07* 0.07*    Glucose Recent Labs  Lab 01/13/18 2202 01/14/18 0728 01/14/18 1056 01/14/18 1523 01/14/18 2226 01/15/18 0808  GLUCAP 154* 87 105* 115* 165* 107*   Imaging No results found.   STUDIES:  CT Chest 5/19 >  Increased air-fluid level Lt upper lung bulla CT abd/pelvis 5/21 > no acute abnormality of  abd / pelv, small left pleural effusion, b/l non-obstructive nephrolithiasis  CULTURES: Blood 5/19 >> Enterococcus Urine 5/19 >> negative  ANTIBIOTICS: Vancomycin 5/19 >> Zosyn 5/19 >> 5/21  SIGNIFICANT EVENTS: 5/19 Presents to ED  5/21 Transfer to SDU  LINES/TUBES:  DISCUSSION: 67 yo male with recurrent infections in setting of RA, BM suppression from MTX, severe bullous emphysema with infected bulla.  Has recurrent diarrhea, and Enterococcal bacteremia.  ASSESSMENT / PLAN:  Sepsis from enterococcal bacteremia. - Continue vanc  Pleurisy from Lt upper lung bullitis. Bullous emphysema. - adjust pain meds - continue abx - continue brovana, pulmicort, duoneb  Acute on chronic hypoxic/hypercapnic respiratory failure. - oxygen to keep SpO2 90 to 95%  A fib with RVR - likely from acute stress in setting of septic shock.  Now resolved. - monitor  Hypokalemia. - K being replaced  Anemia of chronic disease and critical illness. - f/u CBC  Diarrhea. Hx of gastroparesis. -  Continue diet  Advanced rheumatoid arthritis. - continue prednisone   DM. - SSI  DVT prophylaxis - SQ heparin SUP - protonix Nutrition - carb modified/heart healthy Goals of care - full code   PCCM will follow intermittently.  Please call if needed sooner.   Rutherford Guys, [place] - [facility] [place] [facility] Pager: [phone]  or [phone] 01/15/2018, 8:20 AM

## 2018-01-15 NOTE — Telephone Encounter (Signed)
Called to schedule 6 mos follow up visit with Dr. Antoine Poche.  Mrs,. Kreiger wanted Dr. Antoine Poche to know her husband is in ICU with septic shock.  This is his 8th hospitalization since December.

## 2018-01-15 NOTE — Consult Note (Signed)
[facility] for Infectious Disease    Date of Admission:  01/11/2018   Total days of antibiotics 6        Day 6 Vancomycin                Reason for Consult: enterococcal faecalis, clostridium perfringens bacteremia     Referring Provider: CHAMP    Assessment: Joseph [name] is a 67 y.o. immunosuppressed male with complicated past and recent medical history including many hospitalizations including 3 since late April. Presently he was admitted with AMS, high fever (105d recorded in ED) and diarrhea. Found to have polymicrobial bacteremia with enterococcal faecalis, clostridium perfringens which is likely translocation from gut in the setting of significant diarrhea. TTE was negative and cleared blood cultures < 24h. Of note there was also coagulase negative staph species isolated from both blood cultures however the species do not match from site to site which makes this more consistent with contaminant. At our visit he reported increased productive cough over the last 2 days; after review of serial chest images appears he may have some ongoing pneumonia in left upper lobe. Would change vancomycin to ampicillin-sulbactam to cover e. Faecalis and c. Perfringens bacteria as well as any evolving pulmonary process. Would treat for 8 more days to get 10 days total. Pending transfer to [facility] noted for continuity of care for heme/onc and rheum problems. Also possible to perform BM tx here and have pathology send slides if desired.   WBC elevated today - related to ongoing infection vs Granix (last dose 2 days ago per chart) --> would add on a diff.   Plan: 1. Stop vancomycin  2. Start ampicillin-sulbactam 3gm q6h IV x 8 days 3. Differential to CBC  4. Does not need contact precautions for clostridium perfringens.   Principal Problem:   Bacteremia due to Enterococcus Active Problems:   HCAP (healthcare-associated pneumonia)   Sepsis ([name])   Bacteremia due to clostridium  perfringens   . arformoterol  15 mcg Nebulization BID  . aspirin  81 mg Oral Daily  . budesonide (PULMICORT) nebulizer solution  0.5 mg Nebulization BID  . dextromethorphan-guaiFENesin  1 tablet Oral BID  . folic acid  1 mg Oral Daily  . heparin  5,000 Units Subcutaneous Q8H  . insulin aspart  0-15 Units Subcutaneous TID WC  . insulin aspart  0-5 Units Subcutaneous QHS  . ipratropium-albuterol  3 mL Nebulization BID  . mouth rinse  15 mL Mouth Rinse BID  . pantoprazole  40 mg Oral Daily  . potassium chloride  40 mEq Oral TID WC  . predniSONE  15 mg Oral Q breakfast  . pregabalin  75 mg Oral BID  . traZODone  25 mg Oral QHS  . vitamin C  500 mg Oral Daily    HPI: Joseph [name] is a 67 y.o. male with HTN, hyperlipidemia, COPD with home Oxygen use '@3LPM' , chronic diastolic HF, gastroparesis, psoriasis, rheumatoid arthritis (on prednisone 21m daily since 2005 and recently with retrial of Methotrexate), and pancytopenia admitted with fever on 01/11/18 after having had recently been hospitalized several times in April/May for HCAP and relapsing fevers.   Hospitalized on 4/29 - 5/3 for HCAP --> treated with cefepime during hospitalization and discharged on Augmentin to complete course.   Was readmitted on 5/4 - 5/17 for ongoing fevers/chills, large volume diarrhea (20 watery BMs) --> Found to be neutropenic (ANC 1100) and Chest xray showed left upper  lobe thick-walled cavitary lesion that has increased in size and surrounding airspace consolidation. He is a patient at [facility] - started on Granix x 3 daily doses with a normalization of his ANC prior to discharge. CDiff antigen/toxin were both negative and diarrhea resolved after Imodium and stopping antibiotics. 12/31/17 respiratory sample sent for AFB --> smear negative, culture pending.   Readmitted on 01/11/2018 with fevers and altered mental status. Temperature was measured to be 105 deg on arrival. His wife per the chart review reported  he became confused and again started with diarrhea the afternoon of admission. In the emergency room he was found to be hypotensive, tachycardic and elevated lactic acid 3.64 >> 8 after 3L IVF. CT of chest with multiple cavitations again noted with new air-fluid levels concerning for atypical infection superimposed on chronic bullous disease and possible interstitial edema. It was [facility]'s plan to undergo bone marrow biopsy on 5/20 however he has obviously been unable to do this. He was started on Vancomycin and Zosyn after blood and urine cultures were obtained.   Blood cultures 5/18 - right forearm with staph epidermidis and enterococcus faecalis and clostridium perfringens. Left antecubital site with Staph simulans. Since admission his diarrhea has improved/nearly resolved. He describes no further fevers/chills but does have "more productive cough over the last 2 days." He and his wife are hopeful that they can arrange transfer to [facility] for oncology continuity but open to bone marrow sampling here if possible. They are very appreciative of Dr. Sherral Hammers for his care.   Review of Systems: Review of Systems  All other systems reviewed and are negative.   Past Medical History:  Diagnosis Date  . CAD (coronary artery disease)    MI 2007, CABG  . Cataracts, bilateral   . Complication of anesthesia    DIFFICULTY AFTER CABG HAD CONFUSION POST OP  . COPD (chronic obstructive pulmonary disease) ([place])   . Diabetes mellitus    Dx 07-2008 A1C 6.2  . Esophageal dysmotility   . Fatty liver 10/11/09  . Gastroesophageal reflux disease   . Gastroparesis   . Hiatal hernia   . Hypertension   . Neuropathy    (related to RA per neuro note 05-2011)  . Onychomycosis   . Osteoporosis    per DEXA 05-2008  . Oxygen dependent    3l  . Peripheral polyneuropathy    Severe, causing problems with his feet, see surgeries  . Psoriasis   . Pulmonary nodules   . Rheumatoid arthritis ([place])    [facility]  . Septic  shock ([place]) 07/2017    Social History   Tobacco Use  . Smoking status: Former Smoker    Packs/day: 1.00    Years: 40.00    Pack years: 40.00    Last attempt to quit: 08/26/2005    Years since quitting: 12.3  . Smokeless tobacco: Former Systems developer    Quit date: 04/02/2006  . Tobacco comment: smoked x 40 years, 1 ppd  Substance Use Topics  . Alcohol use: No  . Drug use: No    Family History  Problem Relation Age of Onset  . Heart disease Mother   . Diverticulitis Mother   . Heart disease Father   . Heart attack Father        x2  . Colon cancer Neg Hx   . Prostate cancer Neg Hx    No Known Allergies  OBJECTIVE: Blood pressure (!) 141/74, pulse 82, temperature 97.8 F (36.6 C), temperature source Oral,  resp. rate 20, height '6\' 2"'  (1.88 m), weight 214 lb 15.2 oz (97.5 kg), SpO2 100 %.  Physical Exam  Constitutional: He is oriented to person, place, and time.  Resting in bed, appears somewhat uncomfortable.   HENT:  Mouth/Throat: No oral lesions. Normal dentition. No dental caries. No oropharyngeal exudate.  Eyes: Pupils are equal, round, and reactive to light. No scleral icterus.  Cardiovascular: Normal rate, regular rhythm and normal heart sounds.  No murmur heard. Pulmonary/Chest: Effort normal. No respiratory distress. He has wheezes (faint expiratory wheezing ).  Abdominal: Soft. He exhibits no distension. There is no tenderness.  Musculoskeletal:  Deformity to b/l hands noted from baseline RA process   Lymphadenopathy:    He has no cervical adenopathy.  Neurological: He is alert and oriented to person, place, and time.  Skin: Skin is warm and dry. No rash noted.  Psychiatric: He has a normal mood and affect. Judgment normal.  Nursing note and vitals reviewed.   Lab Results Lab Results  Component Value Date   WBC 10.9 (H) 01/15/2018   HGB 9.7 (L) 01/15/2018   HCT 30.2 (L) 01/15/2018   MCV 84.6 01/15/2018   PLT 141 (L) 01/15/2018    Lab Results  Component Value  Date   CREATININE 0.47 (L) 01/15/2018   BUN 8 01/15/2018   NA 141 01/15/2018   K 3.4 (L) 01/15/2018   CL 105 01/15/2018   CO2 26 01/15/2018    Lab Results  Component Value Date   ALT 23 01/11/2018   AST 32 01/11/2018   ALKPHOS 119 01/11/2018   BILITOT 1.0 01/11/2018     Microbiology: Resp Cx 12/31/17 AFB smear negative, culture pending BCx 5/18 >> e faecalis / staph epi on one set, staph similans on second site  BCx 5/19 >> no growth  BCx 5/21 >> no growth   Janene Madeira, MSN, NP-C [facility] [place] Pager: [phone]  01/15/2018 2:23 PM

## 2018-01-15 NOTE — Progress Notes (Signed)
PROGRESS NOTE    Joseph [name]  OQH:[id number] DOB: Apr 04, 1951 DOA: 01/11/2018 PCP: Sharyne Richters, MD   Brief Narrative:  67 y.o. WM PMHx HTN HLD, COPD, Chronic respiratory failure on 3 L , CAD, Chronic Diastolic CHF Gastroparesis, Psoriasis, Rheumatoid arthritis, pancytopenia,   Hospitalized 4/29-5/3 due to HCAP and discharged home on Augmentin.  He developed severe diarrhea the following day w/ more than 20 watery bowel movements and a fever of 102.    ED w/u revealed WBC 4.5, lactic acid 1.58, negative urinalysis, negative C. difficile antigen toxin, temperature 102.8, hypotensive with blood pressure 87/59, tachypnea, oxygen saturation 100% on 3 L nasal cannula oxygen.  Chest x-ray showed known left upper lobe thick-walled cavitary lesion with increased size and increased surrounding airspace consolidation. Patient was discharged on 5/17 to follow-up at [facility] Hematology clinic  Presented 5/19 with AMS and fever. Upon arrival Temp 105. Patient ws recently discharged 5/17 and then followed up at [facility] Hemat[facility]. Wife reports he was doing well until this afternoon when he seemed to be confused as well as having diarrhea. Upon arrival patient is hypotensive, tachycardiac. LA 3.64, received 3L of normal saline, repeat 8. PCCM asked to admit.        Subjective: 5/23 A/O x4, negative CP, negative abdominal pain.  Positive chronic S OB.  Positive fatigue (wife feels depression setting in secondary to multiple hospitalizations).   A /O x4, negative acute S OB, negative CP, negative abdominal pain.  States saw his Hematologist at [facility] Dr. Su Grand [phone] who wanted him to obtain a bone biopsy on Monday unfortunately patient was readmitted on Saturday with sepsis.  In addition per patient Dr. Deniece Ree [facility] Rheumatology [phone] wanted patient to obtain a battery of tests (unsure of exactly the type).    Assessment & Plan:   Active Problems:   Sepsis  ([place])   Sepsis/Enterococcal bacteremia -Complete 14-day course of antibiotics. -CT abdomen pelvis nondiagnostic for cause of enterococcal bacteremia. -Blood culture still pending, NGTD    Pleurisy LEFT upper lung Bullitis  - See acute on chronic respiratory failure   Acute on Chronic Respiratory failure with Hypoxia and Hypercapnia/Bullous Emphysema - Titrate O2 to maintain SPO2 89 to 93% - Brovana 15 mcg BID -Pulmicort 0.5 mg BID - DuoNeb BID -Mucinex DM BID -Prednisone 15 mg daily   Chronic diastolic CHF -strict in and out since admission -661m -Daily weight Filed Weights   01/12/18 0500 01/13/18 0403 01/14/18 0500  Weight: 244 lb 7.8 oz (110.9 kg) 214 lb 15.2 oz (97.5 kg) 214 lb 15.2 oz (97.5 kg)  - Transfuse for hemoglobin<8  A. fib with RVR -Likely from septic shock -Currently NSR - Patient has been on Cardizem but has been DC'd.    Hypokalemia - Potassium goal> 4 - K-Dur 40 meq x 3 -Recheck K/Mg _0    Anemia of Chronic Disease/Critical illness -Negative sign of acute bleed  -Bone marrow biopsy pending   Neutropenia -Received dose of Granix on 5/21 - 5/23 discussed case with RN JRoderic Palauwho is [facility] TSu [phone]  They are going to discuss whether they would like [place] perform bone marrow biopsy or [facility].  Will await their phone call.  Rheumatoid Arthritis - 5/23 [facility]  [facility] [phone]  States he has no further labs he would like drawn at this time is awaiting results of bone marrow biopsy  to be performed by Dr. Su Grand.  -Continue Prednisone 15 mg daily    Diarrhea  -Currently no complaints - Hx gastroparesis. 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  Await call from Dr. Sophronia Simas oncology   Consultants:  PCCM    Procedures/Significant Events:  5/21 CT abdomen and pelvis:-No acute abnormality of the abdomen or pelvis. -Small left pleural effusion.- Bilateral nonobstructive nephrolithiasis. -Aortic Atherosclerosis-Emphysema .    I have personally reviewed and interpreted all radiology studies and my findings are as above.  VENTILATOR SETTINGS:    Cultures 5/19 blood coag negative staph methicillin-resistant Positive 1/2 5/19 Blood positive Enterococcus 5/19 urine negative 5/20 blood NGTD 5/21 blood NGTD     Antimicrobials: Anti-infectives (From admission, onward)   Start     Stop   01/13/18 2255  vancomycin (VANCOCIN) 1,250 mg in sodium chloride 0.9 % 250 mL IVPB         01/12/18 0800  piperacillin-tazobactam (ZOSYN) IVPB 3.375 g  Status:  Discontinued     01/13/18 1218   01/11/18 1400  vancomycin (VANCOCIN) 1,250 mg in sodium chloride 0.9 % 250 mL IVPB  Status:  Discontinued     01/13/18 1512   01/11/18 0045  vancomycin (VANCOCIN) IVPB 750 mg/150 ml premix     01/11/18 0218   01/11/18 0030  piperacillin-tazobactam (ZOSYN) IVPB 3.375 g     01/11/18 0105   01/11/18 0030  vancomycin (VANCOCIN) IVPB 1000 mg/200 mL premix     01/11/18 0146       Devices    LINES / TUBES:      Continuous Infusions: . vancomycin 1,250 mg (01/15/18 0604)     Objective: Vitals:   01/14/18 1922 01/14/18 1938 01/14/18 2233 01/15/18 0335  BP:      Pulse:      Resp:      Temp:  98.5 F (36.9 C) 98.6 F (37 C) 98.5 F (36.9 C)  TempSrc:  Oral Oral Oral  SpO2: 100%     Weight:      Height:        Intake/Output Summary (Last  24 hours) at 01/15/2018 0718 Last data filed at 01/15/2018 0431 Gross per 24 hour  Intake 1540 ml  Output 6876 ml  Net -5336 ml   Filed Weights   01/12/18 0500 01/13/18 0403 01/14/18 0500  Weight: 244 lb 7.8 oz (110.9 kg) 214 lb 15.2 oz (97.5 kg) 214 lb 15.2 oz (97.5 kg)    Physical Exam:  General: A/O x4, positive chronic respiratory distress, fatigue Neck:  Negative scars, masses, torticollis, lymphadenopathy, JVD Lungs: bibasilar rhonchi, bibasilar crackles, negative wheeze  Cardiovascular: Regular rate and rhythm without murmur gallop or rub normal S1 and S2 Abdomen: negative abdominal pain, nondistended, positive soft, bowel sounds, no rebound, no ascites, no appreciable mass Extremities: No significant cyanosis, clubbing, or edema bilateral lower extremities Skin: Negative rashes, lesions, ulcers Psychiatric: Positive depression, negative anxiety, negative fatigue, negative mania  Central nervous system:  Cranial nerves II through XII intact, tongue/uvula midline, all extremities muscle strength 5/5, sensation intact throughout, f negative dysarthria,  negative expressive aphasia, negative receptive aphasia.  .     Data Reviewed: Care during the described time interval was provided by me .  I have reviewed this patient's available data, including medical history, events of note, physical examination, and all test results as part of my evaluation.   CBC: Recent Labs  Lab 01/09/18 0505 01/11/18 0014 01/11/18 0050 01/12/18 0654 01/13/18 0355 01/14/18 0240 01/15/18 0305  WBC 7.2 5.8  --  4.0 2.9* 5.8 10.9*  NEUTROABS 4.0 1.7  --   --  1.4* 4.5  --   HGB 11.0* 14.8 15.3 10.0* 8.9* 8.7* 9.7*  HCT 34.2* 45.1 45.0 30.4* 27.6* 27.6* 30.2*  MCV 85.1 83.4  --  84.2 84.7 84.7 84.6  PLT 244 218  --  145* 121* 107* 245*   Basic Metabolic Panel: Recent Labs  Lab 01/09/18 0505  01/12/18 0654 01/13/18 0355 01/13/18 1353 01/14/18 0240 01/14/18 1534 01/15/18 0305  NA 138   <  > 144 141 141 139  --  141  K 3.9   < > 2.9* 3.3* 3.3* 3.0* 3.3* 3.4*  CL 101   < > 111 109 111 109  --  105  CO2 26   < > 23 23 21* 21*  --  26  GLUCOSE 91   < > 149* 126* 153* 99  --  122*  BUN 16   < > _0 --  8  CREATININE 0.65   < > 0.54* 0.51* 0.65 0.52*  --  0.47*  CALCIUM 8.9   < > 8.3* 8.4* 8.4* 8.2*  --  8.5*  MG 1.8  --  1.6*  --   --   --  1.7 1.8  PHOS  --   --  3.6  --   --   --   --   --    < > = values in this interval not displayed.   GFR: Estimated Creatinine Clearance: 107 mL/min (A) (by C-G formula based on SCr of 0.47 mg/dL (L)). Liver Function Tests: Recent Labs  Lab 01/11/18 0014  AST 32  ALT 23  ALKPHOS 119  BILITOT 1.0  PROT 7.3  ALBUMIN 3.5   No results for input(s): LIPASE, AMYLASE in the last 168 hours. No results for input(s): AMMONIA in the last 168 hours. Coagulation Profile: Recent Labs  Lab 01/11/18 0014  INR 1.02   Cardiac Enzymes: Recent Labs  Lab 01/12/18 2214 01/13/18 0355 01/13/18 0958  TROPONINI 0.07* 0.07* 0.07*   BNP (last 3 results) No results for input(s): PROBNP in the last 8760 hours. HbA1C: No results for input(s): HGBA1C in the last 72 hours. CBG: Recent Labs  Lab 01/13/18 2202 01/14/18 0728 01/14/18 1056 01/14/18 1523 01/14/18 2226  GLUCAP 154* 87 105* 115* 165*   Lipid Profile: No results for input(s): CHOL, HDL, LDLCALC, TRIG, CHOLHDL, LDLDIRECT in the last 72 hours. Thyroid Function Tests: No results for input(s): TSH, T4TOTAL, FREET4, T3FREE, THYROIDAB in the last 72 hours. Anemia Panel: No results for input(s): VITAMINB12, FOLATE, FERRITIN, TIBC, IRON, RETICCTPCT in the last 72 hours. Urine analysis:    Component Value Date/Time   COLORURINE YELLOW 01/11/2018 0225   APPEARANCEUR CLEAR 01/11/2018 0225   LABSPEC 1.013 01/11/2018 0225   PHURINE 5.0 01/11/2018 0225   GLUCOSEU NEGATIVE 01/11/2018 0225   HGBUR NEGATIVE 01/11/2018 0225   HGBUR negative 04/12/2008 1103   [employer] NEGATIVE  01/11/2018 0225   KETONESUR NEGATIVE 01/11/2018 0225   PROTEINUR  NEGATIVE 01/11/2018 0225   UROBILINOGEN 0.2 10/17/2012 1236   NITRITE NEGATIVE 01/11/2018 0225   LEUKOCYTESUR NEGATIVE 01/11/2018 0225   Sepsis Labs: _0 (procalcitonin:4,lacticidven:4)  ) Recent Results (from the past 240 hour(s))  Blood Culture (routine x 2)     Status: Abnormal   Collection Time: 01/11/18 12:15 AM  Result Value Ref Range Status   Specimen Description BLOOD LEFT ANTECUBITAL  Final   Special Requests   Final    BOTTLES DRAWN AEROBIC AND ANAEROBIC Blood Culture adequate volume   Culture  Setup Time   Final    GRAM POSITIVE COCCI IN BOTH AEROBIC AND ANAEROBIC BOTTLES CRITICAL RESULT CALLED TO, READ BACK BY AND VERIFIED WITH: ESINCLAIR,PHARMD _1  01/11/18 BY LHOWARD    Culture (A)  Final    STAPHYLOCOCCUS SPECIES (COAGULASE NEGATIVE) THE SIGNIFICANCE OF ISOLATING THIS ORGANISM FROM A SINGLE SET OF BLOOD CULTURES WHEN MULTIPLE SETS ARE DRAWN IS UNCERTAIN. PLEASE NOTIFY THE MICROBIOLOGY DEPARTMENT WITHIN ONE WEEK IF SPECIATION AND SENSITIVITIES ARE REQUIRED. Performed at [facility], [place] [address], [place], [place] [address]    Report Status 01/14/2018 FINAL  Final  Blood Culture ID Panel (Reflexed)     Status: Abnormal   Collection Time: 01/11/18 12:15 AM  Result Value Ref Range Status   Enterococcus species NOT DETECTED NOT DETECTED Final   Listeria monocytogenes NOT DETECTED NOT DETECTED Final   Staphylococcus species DETECTED (A) NOT DETECTED Final    Comment: Methicillin (oxacillin) resistant coagulase negative staphylococcus. Possible blood culture contaminant (unless isolated from more than one blood culture draw or clinical case suggests pathogenicity). No antibiotic treatment is indicated for blood  culture contaminants. CRITICAL RESULT CALLED TO, READ BACK BY AND VERIFIED WITH: ESINCLAIR,PHARMD _2  01/11/18 BY LHOWARD    Staphylococcus aureus NOT DETECTED NOT DETECTED  Final   Methicillin resistance DETECTED (A) NOT DETECTED Final    Comment: CRITICAL RESULT CALLED TO, READ BACK BY AND VERIFIED WITH: ESINCLAIR,PHARMD _3  01/11/18 BY LHOWARD    Streptococcus species NOT DETECTED NOT DETECTED Final   Streptococcus agalactiae NOT DETECTED NOT DETECTED Final   Streptococcus pneumoniae NOT DETECTED NOT DETECTED Final   Streptococcus pyogenes NOT DETECTED NOT DETECTED Final   Acinetobacter baumannii NOT DETECTED NOT DETECTED Final   Enterobacteriaceae species NOT DETECTED NOT DETECTED Final   Enterobacter cloacae complex NOT DETECTED NOT DETECTED Final   Escherichia coli NOT DETECTED NOT DETECTED Final   Klebsiella oxytoca NOT DETECTED NOT DETECTED Final   Klebsiella pneumoniae NOT DETECTED NOT DETECTED Final   Proteus species NOT DETECTED NOT DETECTED Final   Serratia marcescens NOT DETECTED NOT DETECTED Final   Haemophilus influenzae NOT DETECTED NOT DETECTED Final   Neisseria meningitidis NOT DETECTED NOT DETECTED Final   Pseudomonas aeruginosa NOT DETECTED NOT DETECTED Final   Candida albicans NOT DETECTED NOT DETECTED Final   Candida glabrata NOT DETECTED NOT DETECTED Final   Candida krusei NOT DETECTED NOT DETECTED Final   Candida parapsilosis NOT DETECTED NOT DETECTED Final   Candida tropicalis NOT DETECTED NOT DETECTED Final    Comment: Performed at [facility], [place]. [address], [place],  [address]  Blood Culture (routine x 2)     Status: Abnormal (Preliminary result)   Collection Time: 01/11/18 12:20 AM  Result Value Ref Range Status   Specimen Description BLOOD RIGHT FOREARM  Final   Special Requests   Final    BOTTLES DRAWN AEROBIC AND ANAEROBIC Blood Culture adequate volume   Culture  Setup Time  Final    GRAM POSITIVE RODS ANAEROBIC BOTTLE ONLY CRITICAL RESULT CALLED TO, READ BACK BY AND VERIFIED WITH: E SINCLAIR,PHARMD AT 1443 01/11/18 BY L BENFIELD GRAM POSITIVE COCCI AEROBIC BOTTLE ONLY CRITICAL VALUE NOTED.  VALUE IS  CONSISTENT WITH PREVIOUSLY REPORTED AND CALLED VALUE. Performed at [facility], [place] [address], [place], [place] [address]    Culture (A)  Final    ENTEROCOCCUS FAECALIS STAPHYLOCOCCUS SPECIES (COAGULASE NEGATIVE) THE SIGNIFICANCE OF ISOLATING THIS ORGANISM FROM A SINGLE SET OF BLOOD CULTURES WHEN MULTIPLE SETS ARE DRAWN IS UNCERTAIN. PLEASE NOTIFY THE MICROBIOLOGY DEPARTMENT WITHIN ONE WEEK IF SPECIATION AND SENSITIVITIES ARE REQUIRED. ANAEROBIC GRAM POSITIVE RODS    Report Status PENDING  Incomplete   Organism ID, Bacteria ENTEROCOCCUS FAECALIS  Final      Susceptibility   Enterococcus faecalis - MIC*    AMPICILLIN <=2 SENSITIVE Sensitive     VANCOMYCIN 2 SENSITIVE Sensitive     GENTAMICIN SYNERGY SENSITIVE Sensitive     * ENTEROCOCCUS FAECALIS  Blood Culture ID Panel (Reflexed)     Status: None   Collection Time: 01/11/18 12:20 AM  Result Value Ref Range Status   Enterococcus species NOT DETECTED NOT DETECTED Final   Listeria monocytogenes NOT DETECTED NOT DETECTED Final   Staphylococcus species NOT DETECTED NOT DETECTED Final   Staphylococcus aureus NOT DETECTED NOT DETECTED Final   Streptococcus species NOT DETECTED NOT DETECTED Final   Streptococcus agalactiae NOT DETECTED NOT DETECTED Final   Streptococcus pneumoniae NOT DETECTED NOT DETECTED Final   Streptococcus pyogenes NOT DETECTED NOT DETECTED Final   Acinetobacter baumannii NOT DETECTED NOT DETECTED Final   Enterobacteriaceae species NOT DETECTED NOT DETECTED Final   Enterobacter cloacae complex NOT DETECTED NOT DETECTED Final   Escherichia coli NOT DETECTED NOT DETECTED Final   Klebsiella oxytoca NOT DETECTED NOT DETECTED Final   Klebsiella pneumoniae NOT DETECTED NOT DETECTED Final   Proteus species NOT DETECTED NOT DETECTED Final   Serratia marcescens NOT DETECTED NOT DETECTED Final   Haemophilus influenzae NOT DETECTED NOT DETECTED Final   Neisseria meningitidis NOT DETECTED NOT DETECTED Final    Pseudomonas aeruginosa NOT DETECTED NOT DETECTED Final   Candida albicans NOT DETECTED NOT DETECTED Final   Candida glabrata NOT DETECTED NOT DETECTED Final   Candida krusei NOT DETECTED NOT DETECTED Final   Candida parapsilosis NOT DETECTED NOT DETECTED Final   Candida tropicalis NOT DETECTED NOT DETECTED Final    Comment: Performed at [facility], [address], [place], [place] [address]  Urine culture     Status: None   Collection Time: 01/11/18  2:28 AM  Result Value Ref Range Status   Specimen Description URINE, CLEAN CATCH  Final   Special Requests NONE  Final   Culture   Final    NO GROWTH Performed at [facility], [place] [address], [place], [place] [address]    Report Status 01/12/2018 FINAL  Final  Culture, blood (routine x 2)     Status: None (Preliminary result)   Collection Time: 01/12/18  6:40 AM  Result Value Ref Range Status   Specimen Description BLOOD LEFT HAND  Final   Special Requests   Final    BOTTLES DRAWN AEROBIC ONLY Blood Culture adequate volume   Culture   Final    NO GROWTH 2 DAYS Performed at [facility], [place] [address] [place], [place] [address]    Report Status PENDING  Incomplete  Culture, blood (routine x  2)     Status: None (Preliminary result)   Collection Time: 01/12/18  6:49 AM  Result Value Ref Range Status   Specimen Description BLOOD RIGHT HAND  Final   Special Requests   Final    BOTTLES DRAWN AEROBIC ONLY Blood Culture adequate volume   Culture   Final    NO GROWTH 2 DAYS Performed at [facility], [place] [address] [place], [place] [address]    Report Status PENDING  Incomplete  Culture, blood (routine x 2)     Status: None (Preliminary result)   Collection Time: 01/13/18  2:28 PM  Result Value Ref Range Status   Specimen Description BLOOD LEFT HAND  Final   Special Requests   Final    BOTTLES DRAWN AEROBIC ONLY Blood Culture results may not be optimal due to an inadequate volume of blood received in  culture bottles   Culture   Final    NO GROWTH < 24 HOURS Performed at [facility], [place] [address], [place], [place] [address]    Report Status PENDING  Incomplete  Culture, blood (routine x 2)     Status: None (Preliminary result)   Collection Time: 01/13/18  2:32 PM  Result Value Ref Range Status   Specimen Description BLOOD RIGHT HAND  Final   Special Requests   Final    BOTTLES DRAWN AEROBIC AND ANAEROBIC Blood Culture adequate volume   Culture   Final    NO GROWTH < 24 HOURS Performed at [facility], [place] [address] [place], [place] [address]    Report Status PENDING  Incomplete         Radiology Studies: Ct Abdomen Pelvis W Contrast  Result Date: 01/13/2018 CLINICAL DATA:  Painless diarrhea EXAM: CT ABDOMEN AND PELVIS WITH CONTRAST TECHNIQUE: Multidetector CT imaging of the abdomen and pelvis was performed using the standard protocol following bolus administration of intravenous contrast. CONTRAST:  170m OMNIPAQUE IOHEXOL 300 MG/ML  SOLN COMPARISON:  08/14/2017 FINDINGS: LOWER CHEST: Small left pleural effusion. Emphysema and bibasilar atelectasis. HEPATOBILIARY: Normal hepatic contours and density. No intra- or extrahepatic biliary dilatation. Status post cholecystectomy. PANCREAS: Normal parenchymal contours without ductal dilatation. No peripancreatic fluid collection. SPLEEN: Normal. ADRENALS/URINARY TRACT: --Adrenal glands: Normal. --Right kidney/ureter: Multiple nonobstructing renal stones measuring up 2 7 mm. No hydronephrosis. No ureterolithiasis. --Left kidney/ureter: Tiny non-obstructing 1-2 mm stone at the lower pole. No hydronephrosis. --Urinary bladder: Normal for degree of distention STOMACH/BOWEL: --Stomach/Duodenum: No hiatal hernia or other gastric abnormality. Normal duodenal course. --Small bowel: No dilatation or inflammation. --Colon: There is a fecal management system in the rectum. There is sigmoid diverticulosis without acute inflammation. The  remainder of the colon is normal. --Appendix: Normal. VASCULAR/LYMPHATIC: Atherosclerotic calcification is present within the non-aneurysmal abdominal aorta, without hemodynamically significant stenosis. The portal vein, splenic vein, superior mesenteric vein and IVC are patent. No abdominal or pelvic lymphadenopathy. REPRODUCTIVE: Normal prostate and seminal vesicles. MUSCULOSKELETAL. No bony spinal canal stenosis or focal osseous abnormality. OTHER: None. IMPRESSION: 1. No acute abnormality of the abdomen or pelvis. 2. Small left pleural effusion. 3. Bilateral nonobstructive nephrolithiasis. 4. Aortic Atherosclerosis (ICD10-I70.0) and Emphysema (ICD10-J43.9). Electronically Signed   By: KUlyses JarredM.D.   On: 01/13/2018 17:57        Scheduled Meds: . arformoterol  15 mcg Nebulization BID  . aspirin  81 mg Oral Daily  . budesonide (PULMICORT) nebulizer solution  0.5 mg Nebulization BID  . folic acid  1 mg Oral  Daily  . heparin  5,000 Units Subcutaneous Q8H  . insulin aspart  0-15 Units Subcutaneous TID WC  . insulin aspart  0-5 Units Subcutaneous QHS  . ipratropium-albuterol  3 mL Nebulization BID  . mouth rinse  15 mL Mouth Rinse BID  . pantoprazole  40 mg Oral Daily  . potassium chloride  20 mEq Oral Q4H  . predniSONE  15 mg Oral Q breakfast  . pregabalin  75 mg Oral BID  . vitamin C  500 mg Oral Daily   Continuous Infusions: . vancomycin 1,250 mg (01/15/18 0604)     LOS: 4 days    Time spent: 40 minutes    Bralee Feldt, Geraldo Docker, MD [facility] Pager [phone]   If 7PM-7AM, please contact night-coverage [email or web address] Password [facility] 01/15/2018, 7:18 AM

## 2018-01-16 DIAGNOSIS — J9611 Chronic respiratory failure with hypoxia: Secondary | ICD-10-CM

## 2018-01-16 DIAGNOSIS — Z862 Personal history of diseases of the blood and blood-forming organs and certain disorders involving the immune mechanism: Secondary | ICD-10-CM

## 2018-01-16 DIAGNOSIS — R58 Hemorrhage, not elsewhere classified: Secondary | ICD-10-CM

## 2018-01-16 LAB — BASIC METABOLIC PANEL
Anion gap: 9 (ref 5–15)
BUN: 10 mg/dL (ref 6–20)
CALCIUM: 8.9 mg/dL (ref 8.9–10.3)
CO2: 26 mmol/L (ref 22–32)
CREATININE: 0.53 mg/dL — AB (ref 0.61–1.24)
Chloride: 103 mmol/L (ref 101–111)
GFR calc non Af Amer: >60 mL/min (ref >60)
Glucose, Bld: 100 mg/dL — ABNORMAL HIGH (ref 65–99)
Potassium: 3.8 mmol/L (ref 3.5–5.1)
Sodium: 138 mmol/L (ref 135–145)

## 2018-01-16 LAB — CBC
HCT: 33.5 % — ABNORMAL LOW (ref 39.0–52.0)
Hemoglobin: 10.8 g/dL — ABNORMAL LOW (ref 13.0–17.0)
MCH: 27.6 pg (ref 26.0–34.0)
MCHC: 32.2 g/dL (ref 30.0–36.0)
MCV: 85.5 fL (ref 78.0–100.0)
PLATELETS: 168 10*3/uL (ref 150–400)
RBC: 3.92 MIL/uL — ABNORMAL LOW (ref 4.22–5.81)
RDW: 17 % — AB (ref 11.5–15.5)
WBC: 8.3 10*3/uL (ref 4.0–10.5)

## 2018-01-16 LAB — GLUCOSE, CAPILLARY
GLUCOSE-CAPILLARY: 182 mg/dL — AB (ref 65–99)
GLUCOSE-CAPILLARY: 154 mg/dL — AB (ref 65–99)
GLUCOSE-CAPILLARY: 139 mg/dL — AB (ref 65–99)
Glucose-Capillary: 82 mg/dL (ref 65–99)

## 2018-01-16 LAB — CULTURE, BLOOD (ROUTINE X 2): SPECIAL REQUESTS: ADEQUATE

## 2018-01-16 LAB — MAGNESIUM: Magnesium: 1.8 mg/dL (ref 1.7–2.4)

## 2018-01-16 NOTE — Evaluation (Signed)
Physical Therapy Evaluation Patient Details Name: Joseph [name] MRN: [id number] DOB: November 18, 1950 Today's Date: 01/16/2018   History of Present Illness  Pt. is a 67 y.o. M with advanced rheumatoid arthritis on chronic prednisone 15 mg daily, bolus emphysema, chronic hypoxic respiratory failure on home O2, just discharged from this hospital on 5/17 with plans to follow up at [facility] hematology. Patient readmitted on 5/19 with septic shock secondary to polymicrobial bacteremia left upper lobe pneumonia. Briefly managed in the intensive care unit - transferred 5/22.   Clinical Impression  Pt admitted with above diagnosis. Pt currently with functional limitations due to the deficits listed below (see PT Problem List).  Patient able to ambulate 400 feet using 4 wheeled bilateral platform gait trainer with two person minimal assistance for safety/balance. Patient has had extended hospital admissions recently and presenting with decreased balance and functional strength compared to baseline. Bilateral platform RW placed in room for further gait training and for progressing endurance (patient cannot use regular RW due to finger contractures from RA). Patient is very eager and motivated to walk; encouraged to mobilize with staff as well. Pt will benefit from skilled PT to increase their independence and safety with mobility to allow discharge to the venue listed below.       Follow Up Recommendations Supervision/Assistance - 24 hour;Home health PT    Equipment Recommendations  None recommended by PT    Recommendations for Other Services OT consult     Precautions / Restrictions Precautions Precautions: Fall Restrictions Weight Bearing Restrictions: No      Mobility  Bed Mobility Overal bed mobility: Modified Independent Bed Mobility: Supine to Sit     Supine to sit: Modified independent (Device/Increase time)        Transfers Overall transfer level: Needs assistance Equipment used: (Gait  trainer with bilateral platforms) Transfers: Sit to/from Stand Sit to Stand: Min guard;From elevated surface            Ambulation/Gait Ambulation/Gait assistance: +2 safety/equipment;Min assist Ambulation Distance (Feet): 400 Feet Assistive device: (Gait trainer with bilateral platforms) Gait Pattern/deviations: Step-through pattern;Trunk flexed;Decreased stride length Gait velocity: Decreased    General Gait Details: Uses gait trainer with bilateral platforms. Initially locked back wheels due to patient feeling uncomfortable with instability of 4 wheeled walker, however, allowed PT to unlock once he felt more comfortable and stable. leans forward on walker due to flexion contracture in knees and back issues. Forward trunk flexion increases with fatigue but is able to correct with verbal cueing. ambulated with 3L O2.  Stairs            Wheelchair Mobility    Modified Rankin (Stroke Patients Only)       Balance Overall balance assessment: Needs assistance Sitting-balance support: No upper extremity supported Sitting balance-Leahy Scale: Good Sitting balance - Comments: assist for shoes   Standing balance support: Bilateral upper extremity supported;During functional activity Standing balance-Leahy Scale: Poor Standing balance comment: reliant on external support                             Pertinent Vitals/Pain Pain Assessment: Faces Faces Pain Scale: Hurts a little bit Pain Location: chronic back pain; relieved with movement Pain Descriptors / Indicators: Aching Pain Intervention(s): Limited activity within patient's tolerance;Monitored during session;Repositioned    Home Living Family/patient expects to be discharged to:: Private residence Living Arrangements: Spouse/significant other Available Help at Discharge: Family;Available 24 hours/day Type of Home: House Home Access:  Level entry     Home Layout: Two level Home Equipment: Geophysicist/field seismologist;Wheelchair - manual;Grab bars - tub/shower;Grab bars - toilet;Shower seat - built in;Other (comment);Walker - 2 wheels; Platform RW      Prior Function Level of Independence: Independent with assistive device(s)         Comments: pt reported that on his "good days" he can ambulate short distances in his home with use of a walking stick or bilat platform RW and is independent with ADLs.     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     Exercises     Assessment/Plan    PT Assessment Patient needs continued PT services  PT Problem List Decreased strength;Decreased mobility;Decreased activity tolerance;Decreased balance;Decreased knowledge of use of DME       PT Treatment Interventions Therapeutic activities;Gait training;Therapeutic exercise;Functional mobility training;Patient/family education;DME instruction;Balance training    PT Goals (Current goals can be found in the Care Plan section)  Acute Rehab PT Goals Patient Stated Goal: to walk more PT Goal Formulation: With patient Time For Goal Achievement: 01/30/18 Potential to Achieve Goals: Good    Frequency Min 3X/week   Barriers to discharge        Co-evaluation                AM-PAC PT "6 Clicks" Daily Activity  Outcome Measure Difficulty turning over in bed (including adjusting bedclothes, sheets and blankets)?: None Difficulty moving from lying on back to sitting on the side of the bed? : A Little Difficulty sitting down on and standing up from a chair with arms (e.g., wheelchair, bedside commode, etc,.)?: A Lot Help needed moving to and from a bed to chair (including a wheelchair)?: A Little Help needed walking in hospital room?: A Little Help needed climbing 3-5 steps with a railing? : A Lot 6 Click Score: 17    End of Session Equipment Utilized During Treatment: Oxygen;Gait belt Activity Tolerance: Patient tolerated treatment well Patient left: with call bell/phone within reach;in chair;with family/visitor present Nurse Communication: Mobility status PT Visit Diagnosis: Unsteadiness on feet (R26.81);Other abnormalities of gait and mobility (R26.89);Muscle weakness (generalized) (M62.81)    Time: 4098-1191 PT Time Calculation (min) (ACUTE ONLY): 27 min   Charges:   PT Evaluation $PT Eval Moderate Complexity: 1 Mod PT Treatments $Gait Training: 8-22 mins   PT G Codes:        Laurina Bustle, PT, DPT Acute Rehabilitation Services  Pager: [phone]   Vanetta Mulders 01/16/2018, 3:22 PM

## 2018-01-16 NOTE — Progress Notes (Signed)
PROGRESS NOTE        PATIENT DETAILS Name: Joseph [name] Age: 67 y.o. Sex: male Date of Birth: 04/04/1951 Admit Date: 01/11/2018 Admitting Physician Reyne Dumas, MD NFA:[id number], Early Chars, MD  Brief Narrative: Patient is a 67 y.o. male with advanced rheumatoid arthritis on chronic prednisone 15 mg daily, bolus emphysema, chronic hypoxic respiratory failure on home O2, just discharged from this hospital on 5/17 with plans to follow-up at [facility] hematology-patient was readmitted on 5/19 with septic shock secondary to polymicrobial bacteremia ( enterococcus, Staphylococcus hemolyticus and Clostridium perfringens) left upper lobe pneumonia.  Briefly managed in the intensive care unit-upon stability, transfer to the trial hospitalist service on 5/22.  Subjective: No major issues overnight-denies any chest pain, shortness of breath.  Denies any nausea vomiting.  Assessment/Plan: Septic shock secondary to polymicrobial bloodstream infection (enterococcus, CoNS and Clostridium perfringens) and left upper lobe pneumonia: Sepsis pathophysiology has resolved, clinically improved-ID following-recommendations are to continue with Unasyn for 8 more days from 5/23.  Neutropenia: Issues with recurrent neutropenia during his most recent hospitalization as well-plans were to follow-up with his hematologist at [facility] a potential bone marrow biopsy was being planned.  She apparently has had numerous hospitalizations over the past 6 months with infection and this is his second episode of septic shock in the past 6 months.  Dr. Sherral Hammers discussed with patient's hematologist on 5/23-regarding timing of bone marrow biopsy.  He apparently is scheduled to transfer to [facility] for further work-up-but called transfer center today-unlikely he will have a bed over the weekend.  His neutropenia has resolved-he did receive 1 dose of Granix on 5/21.  Unlikely to have Felty syndrome-as patient does  not have evidence of splenomegaly.  A. fib with RVR: Occurred briefly while in the intensive care unit on 5/19-remains in sinus rhythm at this point. He has a history of ablation in 2018-discussed with the EP service-Amber Sieler PA-C over the phone-recommendations are to monitor on telemetry-since this episode was very brief-and was likely provoked by sepsis-no indication to anticoagulate at this point.  But if he has recurrent episodes-we will then need to think about anticoagulation.  Chronic hypoxic respiratory failure/COPD: Stable without any evidence of exacerbation.  Chronic diastolic heart failure (EF 60-65% by TTE on 01/12/2018): Remains compensated  Rheumatoid arthritis: Continue prednisone daily.  Diarrhea: Has resolved-thought to have translocation  DM-2: CBG stable with SSI.  Hyperlipidemia: Continue with statin  Depression/insomnia: Appears stable-continue trazodone  Telemetry (independently reviewed):NSR  DVT Prophylaxis: Prophylactic Heparin  Code Status: Full code  Family Communication: Spouse at bedside  Disposition Plan: Remain inpatient  Antimicrobial agents: Anti-infectives (From admission, onward)   Start     Dose/Rate Route Frequency Ordered Stop   01/15/18 1430  Ampicillin-Sulbactam (UNASYN) 3 g in sodium chloride 0.9 % 100 mL IVPB  Status:  Discontinued     3 g 200 mL/hr over 30 Minutes Intravenous Every 6 hours 01/15/18 1405 01/15/18 1421   01/15/18 1430  Ampicillin-Sulbactam (UNASYN) 3 g in sodium chloride 0.9 % 100 mL IVPB     3 g 200 mL/hr over 30 Minutes Intravenous Every 6 hours 01/15/18 1421 01/23/18 1429   01/13/18 2255  vancomycin (VANCOCIN) 1,250 mg in sodium chloride 0.9 % 250 mL IVPB  Status:  Discontinued     1,250 mg 166.7 mL/hr over 90 Minutes Intravenous Every 8 hours  01/13/18 1512 01/15/18 1405   01/12/18 0800  piperacillin-tazobactam (ZOSYN) IVPB 3.375 g  Status:  Discontinued     3.375 g 12.5 mL/hr over 240 Minutes Intravenous  Every 8 hours 01/11/18 0040 01/13/18 1218   01/11/18 1400  vancomycin (VANCOCIN) 1,250 mg in sodium chloride 0.9 % 250 mL IVPB  Status:  Discontinued     1,250 mg 166.7 mL/hr over 90 Minutes Intravenous Every 12 hours 01/11/18 0046 01/13/18 1512   01/11/18 0045  vancomycin (VANCOCIN) IVPB 750 mg/150 ml premix     750 mg 150 mL/hr over 60 Minutes Intravenous  Once 01/11/18 0040 01/11/18 0218   01/11/18 0030  piperacillin-tazobactam (ZOSYN) IVPB 3.375 g     3.375 g 100 mL/hr over 30 Minutes Intravenous  Once 01/11/18 0016 01/11/18 0105   01/11/18 0030  vancomycin (VANCOCIN) IVPB 1000 mg/200 mL premix     1,000 mg 200 mL/hr over 60 Minutes Intravenous  Once 01/11/18 0016 01/11/18 0146      Procedures: None  CONSULTS:  pulmonary/intensive care and ID  Time spent: 25 minutes-Greater than 50% of this time was spent in counseling, explanation of diagnosis, planning of further management, and coordination of care.  MEDICATIONS: Scheduled Meds: . arformoterol  15 mcg Nebulization BID  . aspirin  81 mg Oral Daily  . budesonide (PULMICORT) nebulizer solution  0.5 mg Nebulization BID  . dextromethorphan-guaiFENesin  1 tablet Oral BID  . folic acid  1 mg Oral Daily  . heparin  5,000 Units Subcutaneous Q8H  . insulin aspart  0-15 Units Subcutaneous TID WC  . insulin aspart  0-5 Units Subcutaneous QHS  . ipratropium-albuterol  3 mL Nebulization BID  . mouth rinse  15 mL Mouth Rinse BID  . pantoprazole  40 mg Oral Daily  . predniSONE  15 mg Oral Q breakfast  . pregabalin  75 mg Oral BID  . traZODone  25 mg Oral QHS  . vitamin C  500 mg Oral Daily   Continuous Infusions: . ampicillin-sulbactam (UNASYN) IV Stopped (01/16/18 0926)   PRN Meds:.ibuprofen, metoprolol tartrate, nitroGLYCERIN, ondansetron (ZOFRAN) IV, oxyCODONE-acetaminophen   PHYSICAL EXAM: Vital signs: Vitals:   01/16/18 0747 01/16/18 0751 01/16/18 0752 01/16/18 0852  BP:    114/80  Pulse:    88  Resp:    18  Temp:     98.4 F (36.9 C)  TempSrc:    Oral  SpO2: 98% 98% 98% 97%  Weight:      Height:       Filed Weights   01/13/18 0403 01/14/18 0500 01/16/18 0602  Weight: 97.5 kg (214 lb 15.2 oz) 97.5 kg (214 lb 15.2 oz) 92.9 kg (204 lb 12.9 oz)   Body mass index is 26.3 kg/m.   General appearance :Awake, alert, not in any distress.  HEENT: Atraumatic and Normocephalic Neck: supple Resp:Good air entry bilaterally, no added sounds  CVS: S1 S2 regular, no murmurs.  GI: Bowel sounds present, Non tender and not distended with no gaurding, rigidity or rebound.No organomegaly Extremities: B/L Lower Ext shows no edema, both legs are warm to touch Neurology:  speech clear,Non focal, sensation is grossly intact. Musculoskeletal:No digital cyanosis Skin:No Rash, warm and dry Wounds:N/A  I have personally reviewed following labs and imaging studies  LABORATORY DATA: CBC: Recent Labs  Lab 01/11/18 0014  01/12/18 0654 01/13/18 0355 01/14/18 0240 01/15/18 0305 01/15/18 1537 01/16/18 0616  WBC 5.8  --  4.0 2.9* 5.8 10.9*  --  8.3  NEUTROABS 1.7  --   --  1.4* 4.5  --  10.2*  --   HGB 14.8   < > 10.0* 8.9* 8.7* 9.7*  --  10.8*  HCT 45.1   < > 30.4* 27.6* 27.6* 30.2*  --  33.5*  MCV 83.4  --  84.2 84.7 84.7 84.6  --  85.5  PLT 218  --  145* 121* 107* 141*  --  168   < > = values in this interval not displayed.    Basic Metabolic Panel: Recent Labs  Lab 01/12/18 0654 01/13/18 0355 01/13/18 1353 01/14/18 0240 01/14/18 1534 01/15/18 0305 01/15/18 1537 01/16/18 0616  NA 144 141 141 139  --  141  --  138  K 2.9* 3.3* 3.3* 3.0* 3.3* 3.4* 4.0 3.8  CL 111 109 111 109  --  105  --  103  CO2 23 23 21* 21*  --  26  --  26  GLUCOSE 149* 126* 153* 99  --  122*  --  100*  BUN '9 11 10 11  ' --  8  --  10  CREATININE 0.54* 0.51* 0.65 0.52*  --  0.47*  --  0.53*  CALCIUM 8.3* 8.4* 8.4* 8.2*  --  8.5*  --  8.9  MG 1.6*  --   --   --  1.7 1.8 2.0 1.8  PHOS 3.6  --   --   --   --   --   --   --      GFR: Estimated Creatinine Clearance: 107 mL/min (A) (by C-G formula based on SCr of 0.53 mg/dL (L)).  Liver Function Tests: Recent Labs  Lab 01/11/18 0014  AST 32  ALT 23  ALKPHOS 119  BILITOT 1.0  PROT 7.3  ALBUMIN 3.5   No results for input(s): LIPASE, AMYLASE in the last 168 hours. No results for input(s): AMMONIA in the last 168 hours.  Coagulation Profile: Recent Labs  Lab 01/11/18 0014  INR 1.02    Cardiac Enzymes: Recent Labs  Lab 01/12/18 2214 01/13/18 0355 01/13/18 0958  TROPONINI 0.07* 0.07* 0.07*    BNP (last 3 results) No results for input(s): PROBNP in the last 8760 hours.  HbA1C: No results for input(s): HGBA1C in the last 72 hours.  CBG: Recent Labs  Lab 01/15/18 0808 01/15/18 1211 01/15/18 1544 01/15/18 2055 01/16/18 0754  GLUCAP 107* 136* 175* 101* 82    Lipid Profile: No results for input(s): CHOL, HDL, LDLCALC, TRIG, CHOLHDL, LDLDIRECT in the last 72 hours.  Thyroid Function Tests: No results for input(s): TSH, T4TOTAL, FREET4, T3FREE, THYROIDAB in the last 72 hours.  Anemia Panel: No results for input(s): VITAMINB12, FOLATE, FERRITIN, TIBC, IRON, RETICCTPCT in the last 72 hours.  Urine analysis:    Component Value Date/Time   COLORURINE YELLOW 01/11/2018 0225   APPEARANCEUR CLEAR 01/11/2018 0225   LABSPEC 1.013 01/11/2018 0225   PHURINE 5.0 01/11/2018 0225   GLUCOSEU NEGATIVE 01/11/2018 0225   HGBUR NEGATIVE 01/11/2018 0225   HGBUR negative 04/12/2008 1103   BILIRUBINUR NEGATIVE 01/11/2018 0225   KETONESUR NEGATIVE 01/11/2018 0225   PROTEINUR NEGATIVE 01/11/2018 0225   UROBILINOGEN 0.2 10/17/2012 1236   NITRITE NEGATIVE 01/11/2018 0225   LEUKOCYTESUR NEGATIVE 01/11/2018 0225    Sepsis Labs: Lactic Acid, Venous    Component Value Date/Time   LATICACIDVEN 2.4 ([place]) 01/11/2018 2234    MICROBIOLOGY: Recent Results (from the past 240 hour(s))  Blood Culture (routine x 2)     Status: Abnormal   Collection Time:  01/11/18 12:15 AM  Result Value Ref Range Status   Specimen Description BLOOD LEFT ANTECUBITAL  Final   Special Requests   Final    BOTTLES DRAWN AEROBIC AND ANAEROBIC Blood Culture adequate volume   Culture  Setup Time   Final    GRAM POSITIVE COCCI IN BOTH AEROBIC AND ANAEROBIC BOTTLES CRITICAL RESULT CALLED TO, READ BACK BY AND VERIFIED WITH: ESINCLAIR,PHARMD '@1900'  01/11/18 BY LHOWARD    Culture (A)  Final    STAPHYLOCOCCUS EPIDERMIDIS STAPHYLOCOCCUS SIMULANS THE SIGNIFICANCE OF ISOLATING THIS ORGANISM FROM A SINGLE SET OF BLOOD CULTURES WHEN MULTIPLE SETS ARE DRAWN IS UNCERTAIN. PLEASE NOTIFY THE MICROBIOLOGY DEPARTMENT WITHIN ONE WEEK IF SPECIATION AND SENSITIVITIES ARE REQUIRED. Performed at [facility], [place] [address] [place], [place] [address]    Report Status 01/16/2018 FINAL  Final  Blood Culture ID Panel (Reflexed)     Status: Abnormal   Collection Time: 01/11/18 12:15 AM  Result Value Ref Range Status   Enterococcus species NOT DETECTED NOT DETECTED Final   Listeria monocytogenes NOT DETECTED NOT DETECTED Final   Staphylococcus species DETECTED (A) NOT DETECTED Final    Comment: Methicillin (oxacillin) resistant coagulase negative staphylococcus. Possible blood culture contaminant (unless isolated from more than one blood culture draw or clinical case suggests pathogenicity). No antibiotic treatment is indicated for blood  culture contaminants. CRITICAL RESULT CALLED TO, READ BACK BY AND VERIFIED WITH: ESINCLAIR,PHARMD '@1900'  01/11/18 BY LHOWARD    Staphylococcus aureus NOT DETECTED NOT DETECTED Final   Methicillin resistance DETECTED (A) NOT DETECTED Final    Comment: CRITICAL RESULT CALLED TO, READ BACK BY AND VERIFIED WITH: ESINCLAIR,PHARMD '@1900'  01/11/18 BY LHOWARD    Streptococcus species NOT DETECTED NOT DETECTED Final   Streptococcus agalactiae NOT DETECTED NOT DETECTED Final   Streptococcus pneumoniae NOT DETECTED NOT DETECTED Final   Streptococcus  pyogenes NOT DETECTED NOT DETECTED Final   Acinetobacter baumannii NOT DETECTED NOT DETECTED Final   Enterobacteriaceae species NOT DETECTED NOT DETECTED Final   Enterobacter cloacae complex NOT DETECTED NOT DETECTED Final   Escherichia coli NOT DETECTED NOT DETECTED Final   Klebsiella oxytoca NOT DETECTED NOT DETECTED Final   Klebsiella pneumoniae NOT DETECTED NOT DETECTED Final   Proteus species NOT DETECTED NOT DETECTED Final   Serratia marcescens NOT DETECTED NOT DETECTED Final   Haemophilus influenzae NOT DETECTED NOT DETECTED Final   Neisseria meningitidis NOT DETECTED NOT DETECTED Final   Pseudomonas aeruginosa NOT DETECTED NOT DETECTED Final   Candida albicans NOT DETECTED NOT DETECTED Final   Candida glabrata NOT DETECTED NOT DETECTED Final   Candida krusei NOT DETECTED NOT DETECTED Final   Candida parapsilosis NOT DETECTED NOT DETECTED Final   Candida tropicalis NOT DETECTED NOT DETECTED Final    Comment: Performed at [facility], [place]. [address], [place], [place] [address]  Blood Culture (routine x 2)     Status: Abnormal   Collection Time: 01/11/18 12:20 AM  Result Value Ref Range Status   Specimen Description BLOOD RIGHT FOREARM  Final   Special Requests   Final    BOTTLES DRAWN AEROBIC AND ANAEROBIC Blood Culture adequate volume   Culture  Setup Time   Final    GRAM POSITIVE RODS ANAEROBIC BOTTLE ONLY CRITICAL RESULT CALLED TO, READ BACK BY AND VERIFIED WITH: E SINCLAIR,PHARMD AT 2831 01/11/18 BY L BENFIELD GRAM POSITIVE COCCI AEROBIC BOTTLE ONLY CRITICAL VALUE NOTED.  VALUE IS CONSISTENT WITH PREVIOUSLY REPORTED AND CALLED VALUE. Performed at [facility],  [address], [place], [place] [address]    Culture (A)  Final    ENTEROCOCCUS FAECALIS STAPHYLOCOCCUS HAEMOLYTICUS THE SIGNIFICANCE OF ISOLATING THIS ORGANISM FROM A SINGLE SET OF BLOOD CULTURES WHEN MULTIPLE SETS ARE DRAWN IS UNCERTAIN. PLEASE NOTIFY THE MICROBIOLOGY DEPARTMENT WITHIN ONE WEEK IF  SPECIATION AND SENSITIVITIES ARE REQUIRED. CLOSTRIDIUM PERFRINGENS    Report Status 01/15/2018 FINAL  Final   Organism ID, Bacteria ENTEROCOCCUS FAECALIS  Final      Susceptibility   Enterococcus faecalis - MIC*    AMPICILLIN <=2 SENSITIVE Sensitive     VANCOMYCIN 2 SENSITIVE Sensitive     GENTAMICIN SYNERGY SENSITIVE Sensitive     * ENTEROCOCCUS FAECALIS  Blood Culture ID Panel (Reflexed)     Status: None   Collection Time: 01/11/18 12:20 AM  Result Value Ref Range Status   Enterococcus species NOT DETECTED NOT DETECTED Final   Listeria monocytogenes NOT DETECTED NOT DETECTED Final   Staphylococcus species NOT DETECTED NOT DETECTED Final   Staphylococcus aureus NOT DETECTED NOT DETECTED Final   Streptococcus species NOT DETECTED NOT DETECTED Final   Streptococcus agalactiae NOT DETECTED NOT DETECTED Final   Streptococcus pneumoniae NOT DETECTED NOT DETECTED Final   Streptococcus pyogenes NOT DETECTED NOT DETECTED Final   Acinetobacter baumannii NOT DETECTED NOT DETECTED Final   Enterobacteriaceae species NOT DETECTED NOT DETECTED Final   Enterobacter cloacae complex NOT DETECTED NOT DETECTED Final   Escherichia coli NOT DETECTED NOT DETECTED Final   Klebsiella oxytoca NOT DETECTED NOT DETECTED Final   Klebsiella pneumoniae NOT DETECTED NOT DETECTED Final   Proteus species NOT DETECTED NOT DETECTED Final   Serratia marcescens NOT DETECTED NOT DETECTED Final   Haemophilus influenzae NOT DETECTED NOT DETECTED Final   Neisseria meningitidis NOT DETECTED NOT DETECTED Final   Pseudomonas aeruginosa NOT DETECTED NOT DETECTED Final   Candida albicans NOT DETECTED NOT DETECTED Final   Candida glabrata NOT DETECTED NOT DETECTED Final   Candida krusei NOT DETECTED NOT DETECTED Final   Candida parapsilosis NOT DETECTED NOT DETECTED Final   Candida tropicalis NOT DETECTED NOT DETECTED Final    Comment: Performed at [facility], [address], [place], [place] [address]  Urine  culture     Status: None   Collection Time: 01/11/18  2:28 AM  Result Value Ref Range Status   Specimen Description URINE, CLEAN CATCH  Final   Special Requests NONE  Final   Culture   Final    NO GROWTH Performed at [facility], [place] [address], [place], [place] [address]    Report Status 01/12/2018 FINAL  Final  Culture, blood (routine x 2)     Status: None (Preliminary result)   Collection Time: 01/12/18  6:40 AM  Result Value Ref Range Status   Specimen Description BLOOD LEFT HAND  Final   Special Requests   Final    BOTTLES DRAWN AEROBIC ONLY Blood Culture adequate volume   Culture   Final    NO GROWTH 3 DAYS Performed at [facility], [place] [address] [place], [place] [address]    Report Status PENDING  Incomplete  Culture, blood (routine x 2)     Status: None (Preliminary result)   Collection Time: 01/12/18  6:49 AM  Result Value Ref Range Status   Specimen Description BLOOD RIGHT HAND  Final   Special Requests   Final    BOTTLES DRAWN AEROBIC ONLY Blood Culture adequate volume   Culture   Final  NO GROWTH 3 DAYS Performed at [facility], [place] [address], [place], [place] [address]    Report Status PENDING  Incomplete  Culture, blood (routine x 2)     Status: None (Preliminary result)   Collection Time: 01/13/18  2:28 PM  Result Value Ref Range Status   Specimen Description BLOOD LEFT HAND  Final   Special Requests   Final    BOTTLES DRAWN AEROBIC ONLY Blood Culture results may not be optimal due to an inadequate volume of blood received in culture bottles   Culture   Final    NO GROWTH 2 DAYS Performed at [facility], [place] [address] [place], [place] [address]    Report Status PENDING  Incomplete  Culture, blood (routine x 2)     Status: None (Preliminary result)   Collection Time: 01/13/18  2:32 PM  Result Value Ref Range Status   Specimen Description BLOOD RIGHT HAND  Final   Special Requests   Final    BOTTLES DRAWN AEROBIC AND  ANAEROBIC Blood Culture adequate volume   Culture   Final    NO GROWTH 2 DAYS Performed at [facility], [place] [address] [place], [place] [address]    Report Status PENDING  Incomplete    RADIOLOGY STUDIES/RESULTS: Dg Chest 2 View  Result Date: 01/02/2018 CLINICAL DATA:  Dyspnea. Re-evaluate left upper lobe cavitary lesion. EXAM: CHEST - 2 VIEW COMPARISON:  12/27/2017. FINDINGS: Stable enlarged cardiac silhouette and post CABG changes. The previously seen 7.6 cm left upper lobe cavitary lesion again measures 7.6 cm in maximum diameter. The this has interval increased density. Adjacent patchy opacity is unchanged. Right upper lobe bullous changes again demonstrated. The interstitial markings remain prominent. Diffuse osteopenia. IMPRESSION: 1. Interval fluid accumulation within the previously demonstrated left upper lobe cavitary lesion. 2. Stable mild adjacent patchy pneumonia or atelectasis. 3. Stable cardiomegaly, chronic interstitial lung disease and right upper lobe bullous changes. Electronically Signed   By: Claudie Revering M.D.   On: 01/02/2018 12:55   Dg Chest 2 View  Result Date: 12/22/2017 CLINICAL DATA:  Rheumatoid flare up with fever for 2 days. Ex-smoker. EXAM: CHEST - 2 VIEW COMPARISON:  Radiographs 10/27/2017 and 11/08/2017.  CT 08/14/2017. FINDINGS: The heart size and mediastinal contours are stable status post median sternotomy and CABG. There is aortic atherosclerosis. Diffuse central airway thickening is present. There are multiple upper lobe bulla. Dominant left upper lobe lesion demonstrates interval increased wall thickening which may be secondary to superimposed infection or hemorrhage. There is no focal airspace disease, pleural effusion or pneumothorax. No acute osseous findings are evident. IMPRESSION: Interval increased wall thickening of dominant left upper lobe bulla, possibly due to super infection or hemorrhage. Diffuse central airway thickening. No focal airspace  disease. Electronically Signed   By: Richardean Sale M.D.   On: 12/22/2017 10:50   Ct Chest W Contrast  Result Date: 01/11/2018 CLINICAL DATA:  Acute onset of generalized weakness and fever. Decreased oral intake. EXAM: CT CHEST WITH CONTRAST TECHNIQUE: Multidetector CT imaging of the chest was performed during intravenous contrast administration. CONTRAST:  148m OMNIPAQUE IOHEXOL 300 MG/ML  SOLN COMPARISON:  Chest radiograph performed earlier today at 12:22 a.m., and CT of the chest performed 12/22/2017 FINDINGS: Cardiovascular: The heart is normal in size. Diffuse coronary artery calcifications are seen. Scattered calcification noted along the thoracic aorta and proximal great vessels. The descending thoracic aorta is borderline normal in caliber. Mediastinum/Nodes: The patient is status post  median sternotomy. Visualized mediastinal nodes remain borderline normal in size. No pericardial effusion is identified. The thyroid gland is unremarkable. No axillary lymphadenopathy is seen. 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 Peripheral honeycombing is noted bilaterally. Mild interstitial prominence may reflect mild interstitial edema. No definite pleural effusion or pneumothorax is seen. Upper Abdomen: The visualized portions of the liver and spleen are unremarkable. The patient is status post cholecystectomy, with a clip noted at the gallbladder fossa. The visualized portions of the spleen are unremarkable. A tiny hiatal hernia is noted. Musculoskeletal: No acute osseous abnormalities are identified. The visualized musculature is unremarkable in appearance. IMPRESSION: 1. Multiple cavitations noted at the left lung apex, with new air-fluid levels, concerning for atypical infection superimposed on chronic bullous disease. 2. Mild interstitial prominence may reflect mild  interstitial edema. 3. Diffuse coronary artery calcifications seen. 4. Tiny hiatal hernia noted. Electronically Signed   By: Garald Balding M.D.   On: 01/11/2018 04:23   Ct Chest W Contrast  Result Date: 12/22/2017 CLINICAL DATA:  Fever EXAM: CT CHEST WITH CONTRAST TECHNIQUE: Multidetector CT imaging of the chest was performed during intravenous contrast administration. CONTRAST:  31m ISOVUE-300 IOPAMIDOL (ISOVUE-300) INJECTION 61% COMPARISON:  Chest radiograph December 22, 2016; chest CT August 14, 2017 FINDINGS: Cardiovascular: There is no thoracic aortic aneurysm or dissection. There is calcification at the origins of the left common carotid and left subclavian arteries. Mild scattered calcification is noted elsewhere the great vessels. There is aortic atherosclerosis. There are foci of native coronary artery calcification. The patient is status post coronary artery bypass grafting. There is no major vessel pulmonary embolus. No pericardial effusion or pericardial thickening is evident. Mediastinum/Nodes: Thyroid appears normal. There are multiple subcentimeter mediastinal lymph nodes. There is a subcarinal lymph node measuring 1.3 x 1.0 cm. There is a second sub- carinal lymph node measuring 1.2 x 1.0 cm. There is a small hiatal hernia. Lungs/Pleura: There is underlying bullous emphysematous change. Extensive bullous disease is noted in both upper lobes. In comparison with the previous study, there is a bulla in the posterior segment left upper lobe measuring 7.7 x 4.6 cm. The wall of this bulla has become considerably thicker compared to most recent study. There is also thickening along the nearby major fissure with localized consolidation adjacent to the major fissure in the posterior segment of the left upper lobe. There is no other area of consolidation. There are areas of scarring and atelectatic change in the lung bases. There are scattered areas of fibrosis which appear stable. There is a nodular  opacity along the right major fissure measuring 9 x 5 mm, stable and likely representing an intrafissural lymph node. Upper Abdomen: Gallbladder is absent. Spleen is incompletely visualized. Spleen measures 13.8 cm from anterior to posterior dimension which is prominent. There is upper abdominal aortic atherosclerosis. Musculoskeletal: No blastic or lytic bone lesions are evident. Patient is status post median sternotomy. No chest wall lesions are evident. IMPRESSION: 1. 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 These changes are similar compared to prior study. Note, however, that the largest bulla, located in the posterior segment left upper lobe, shows considerable wall thickening, a change from the previous study. There is a nearby area of airspace consolidation in the posterior segment of the left upper lobe adjacent to the major fissure, felt to represent pneumonia. The thickening of the wall  of the bulla is most likely due to infection of this bulla. Atypical cystic neoplasm may present in this manner and cannot be entirely excluded at this time. Note that there is no evidence of mycetoma in this bulla or elsewhere. Given this circumstance, would advise repeat CT after treatment for pneumonia to assess for stability or possible resolution of wall thickening in this bulla. Given the possibility of underlying neoplasm, close clinical and imaging surveillance advised in this regard. 2. Mildly prominent sub-carinal lymph nodes of uncertain etiology. These lymph nodes could well be reactive given the parenchymal lung changes. 3. Status post coronary artery bypass grafting. There is aortic and great vessel atherosclerotic calcification as well as calcification in native coronary arteries. 4.  Small hiatal hernia. 5.  Spleen appears enlarged although incompletely visualized. 6.  Gallbladder surgically absent. Aortic Atherosclerosis (ICD10-I70.0) and Emphysema  (ICD10-J43.9). Electronically Signed   By: Lowella Grip III M.D.   On: 12/22/2017 13:17   Ct Abdomen Pelvis W Contrast  Result Date: 01/13/2018 CLINICAL DATA:  Painless diarrhea EXAM: CT ABDOMEN AND PELVIS WITH CONTRAST TECHNIQUE: Multidetector CT imaging of the abdomen and pelvis was performed using the standard protocol following bolus administration of intravenous contrast. CONTRAST:  173m OMNIPAQUE IOHEXOL 300 MG/ML  SOLN COMPARISON:  08/14/2017 FINDINGS: LOWER CHEST: Small left pleural effusion. Emphysema and bibasilar atelectasis. HEPATOBILIARY: Normal hepatic contours and density. No intra- or extrahepatic biliary dilatation. Status post cholecystectomy. PANCREAS: Normal parenchymal contours without ductal dilatation. No peripancreatic fluid collection. SPLEEN: Normal. ADRENALS/URINARY TRACT: --Adrenal glands: Normal. --Right kidney/ureter: Multiple nonobstructing renal stones measuring up 2 7 mm. No hydronephrosis. No ureterolithiasis. --Left kidney/ureter: Tiny non-obstructing 1-2 mm stone at the lower pole. No hydronephrosis. --Urinary bladder: Normal for degree of distention STOMACH/BOWEL: --Stomach/Duodenum: No hiatal hernia or other gastric abnormality. Normal duodenal course. --Small bowel: No dilatation or inflammation. --Colon: There is a fecal management system in the rectum. There is sigmoid diverticulosis without acute inflammation. The remainder of the colon is normal. --Appendix: Normal. VASCULAR/LYMPHATIC: Atherosclerotic calcification is present within the non-aneurysmal abdominal aorta, without hemodynamically significant stenosis. The portal vein, splenic vein, superior mesenteric vein and IVC are patent. No abdominal or pelvic lymphadenopathy. REPRODUCTIVE: Normal prostate and seminal vesicles. MUSCULOSKELETAL. No bony spinal canal stenosis or focal osseous abnormality. OTHER: None. IMPRESSION: 1. No acute abnormality of the abdomen or pelvis. 2. Small left pleural effusion. 3.  Bilateral nonobstructive nephrolithiasis. 4. Aortic Atherosclerosis (ICD10-I70.0) and Emphysema (ICD10-J43.9). Electronically Signed   By: KUlyses JarredM.D.   On: 01/13/2018 17:57   Dg Chest Port 1 View  Result Date: 01/11/2018 CLINICAL DATA:  Sepsis, fever and weakness.  Dyspnea x1 day. EXAM: PORTABLE CHEST 1 VIEW COMPARISON:  Chest CT 12/22/2017, CXR 01/02/2018 FINDINGS: Stable cardiomegaly with post CABG change and aortic atherosclerosis. Redemonstration of left apical bullae, the largest is partially filled with fluid and thick-walled compatible with an infected bulla. This is slightly smaller in appearance than on prior more cavitary in appearance than recent comparison chest radiograph. Subsegmental scarring is seen in the right upper lobe and mid lung. Probable trace right effusion. No acute osseous abnormality. IMPRESSION: 1. Cardiomegaly with aortic atherosclerosis and post CABG change. 2. Thick-walled left upper lobe bulla compatible with an infected bulla is redemonstrated though slightly more cavitary in appearance and less opacified than on prior comparison. This would suggest some partial clearing of fluid within. Electronically Signed   By: DAshley RoyaltyM.D.   On: 01/11/2018 00:46   Dg Chest [facility]  Result Date: 12/27/2017 CLINICAL DATA:  Sepsis. EXAM: PORTABLE CHEST 1 VIEW COMPARISON:  Chest radiograph 12/27/2017, chest CT 12/22/2017 FINDINGS: Postsurgical changes from CABG. Cardiomediastinal silhouette is normal. Mediastinal contours appear intact. Emphysema, chronic interstitial lung changes. Previously noted thick-walled cystic structure in the left upper lobe has increased in size no measuring 7.6 cm, with increased surrounding airspace consolidation. Osseous structures are without acute abnormality. Soft tissues are grossly normal. IMPRESSION: Known left upper lobe thick-walled cavitary lesion has increased in size with increased surrounding airspace consolidation. Emphysema with  chronic interstitial lung changes. Electronically Signed   By: Fidela Salisbury M.D.   On: 12/27/2017 17:34   Dg Chest Port 1 View  Result Date: 12/24/2017 CLINICAL DATA:  Pneumonia. EXAM: PORTABLE CHEST 1 VIEW COMPARISON:  CT scan and radiographs of December 22, 2017. FINDINGS: Stable cardiomediastinal silhouette. Status post coronary artery bypass graft. No pneumothorax is noted. Stable scarring is seen throughout both lungs, most prominently seen in right upper lobe. Left upper lobe bulla is again noted with significant wall thickening consistent with infection and surrounding pneumonia. Bony thorax is unremarkable. IMPRESSION: Left upper lobe bulla is again noted with significant wall thickening consistent with infection and surrounding pneumonia as described on prior CT scan. Electronically Signed   By: Marijo Conception, M.D.   On: 12/24/2017 10:00   Dg Hips Bilat With Pelvis 2v  Result Date: 01/07/2018 CLINICAL DATA:  Pain in both hips for a week EXAM: DG HIP (WITH OR WITHOUT PELVIS) 2V BILAT COMPARISON:  CT 08/14/2017 FINDINGS: Mild SI joint degenerative changes. Pubic symphysis and rami are intact. 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 No fracture or malalignment. Vascular calcifications. IMPRESSION: Mild arthritis of the hips.  No acute osseous abnormality Electronically Signed   By: Donavan Foil M.D.   On: 01/07/2018 20:25     LOS: 5 days   Oren Binet, MD  [facility]  If 7PM-7AM, please contact night-coverage  Please page via [email or web address]-click on MD name and type text message  01/16/2018, 10:44 AM

## 2018-01-16 NOTE — Progress Notes (Deleted)
Patient is going to be transferred to 4W inpatient rehab. Gave report to RN on 4W. IV has been taken out, and patient has his belongings with him. Patient is going to transferred via wheelchair to 4W by Vonna Kotyk, RN on 32M.   Jaynee Eagles, RN

## 2018-01-16 NOTE — Progress Notes (Signed)
[facility]    Date of Admission:  01/11/2018   Total days of antibiotics 7        Day 2 amp/sub   ID: Joseph [name] is a 67 y.o. male with advanced rheumatoid arthritis on chronic prednisone 15 mg daily, bolus emphysema, chronic hypoxic respiratory failure on home O2, just discharged from this hospital on 5/17 but bounced back in 24hrs with septic shock secondary to polymicrobial bacteremia ( enterococcus, Staphylococcus hemolyticus and Clostridium perfringens) plus  left upper lobe pneumonia. currently on amp/sub   Principal Problem:   Bacteremia due to Enterococcus Active Problems:   HCAP (healthcare-associated pneumonia)   Sepsis (HCC)   Bacteremia due to clostridium perfringens    Subjective: Afebrile, ambulated 400 ft and feels good. Still productive sputum with pulmonary hygiene efforts. No diarrhea, no chills.   Medications:  . arformoterol  15 mcg Nebulization BID  . aspirin  81 mg Oral Daily  . budesonide (PULMICORT) nebulizer solution  0.5 mg Nebulization BID  . dextromethorphan-guaiFENesin  1 tablet Oral BID  . folic acid  1 mg Oral Daily  . heparin  5,000 Units Subcutaneous Q8H  . insulin aspart  0-15 Units Subcutaneous TID WC  . insulin aspart  0-5 Units Subcutaneous QHS  . ipratropium-albuterol  3 mL Nebulization BID  . mouth rinse  15 mL Mouth Rinse BID  . pantoprazole  40 mg Oral Daily  . predniSONE  15 mg Oral Q breakfast  . pregabalin  75 mg Oral BID  . traZODone  25 mg Oral QHS  . vitamin C  500 mg Oral Daily    Objective: Vital signs in last 24 hours: Temp:  [97.7 F (36.5 C)-98.5 F (36.9 C)] 98.5 F (36.9 C) (05/24 1701) Pulse Rate:  [66-88] 66 (05/24 1701) Resp:  [16-20] 20 (05/24 1701) BP: (106-143)/(55-80) 106/55 (05/24 1701) SpO2:  [97 %-100 %] 100 % (05/24 1701) Weight:  [204 lb 12.9 oz (92.9 kg)] 204 lb 12.9 oz (92.9 kg) (05/24 0602) Physical Exam  Constitutional: He is oriented to person, place, and time.  He appears well-developed and well-nourished. No distress.  HENT:  Mouth/Throat: Oropharynx is clear and moist. No oropharyngeal exudate.  Cardiovascular: Normal rate, regular rhythm and normal heart sounds. Exam reveals no gallop and no friction rub.  No murmur heard.  Pulmonary/Chest: Effort normal and breath sounds normal. No respiratory distress. He has no wheezes.  Abdominal: Soft. Bowel sounds are normal. He exhibits no distension. There is no tenderness.  Ext: hand deformation c/w RA  Neurological: He is alert and oriented to person, place, and time.  Skin: Skin has scattered echymosis Psychiatric: He has a normal mood and affect. His behavior is normal.     Lab Results Recent Labs    01/15/18 0305 01/15/18 1537 01/16/18 0616  WBC 10.9*  --  8.3  HGB 9.7*  --  10.8*  HCT 30.2*  --  33.5*  NA 141  --  138  K 3.4* 4.0 3.8  CL 105  --  103  CO2 26  --  26  BUN 8  --  10  CREATININE 0.47*  --  0.53*    Microbiology: 5/19 blood cx + enterococcus, c.perfringis, 3 staph species 5/20 blood cx NGTD Studies/Results: Reviewed echo Assessment/Plan: Polymicrobial bacteremia = continue on amp/sub treat for 8 addn days to finish 14d course  HCAP  = continue with amp/sub.continue with pulmonary hygiene  Deconditioned = continue with ambulation and  working with PT  Hx of neutropenia = recently received GCSF on 5/21. Follow cbc with diff QOD.  Dr Zenaida Niece dam to see over the weekend. Will see back on monday  [facility] Cell: [phone] Pager: [phone]  01/16/2018, 6:27 PM

## 2018-01-17 DIAGNOSIS — I4891 Unspecified atrial fibrillation: Secondary | ICD-10-CM

## 2018-01-17 DIAGNOSIS — I351 Nonrheumatic aortic (valve) insufficiency: Secondary | ICD-10-CM

## 2018-01-17 DIAGNOSIS — D709 Neutropenia, unspecified: Secondary | ICD-10-CM

## 2018-01-17 DIAGNOSIS — B957 Other staphylococcus as the cause of diseases classified elsewhere: Secondary | ICD-10-CM

## 2018-01-17 DIAGNOSIS — R7881 Bacteremia: Secondary | ICD-10-CM

## 2018-01-17 DIAGNOSIS — B967 Clostridium perfringens [C. perfringens] as the cause of diseases classified elsewhere: Secondary | ICD-10-CM

## 2018-01-17 DIAGNOSIS — Z8619 Personal history of other infectious and parasitic diseases: Secondary | ICD-10-CM

## 2018-01-17 DIAGNOSIS — M069 Rheumatoid arthritis, unspecified: Secondary | ICD-10-CM

## 2018-01-17 DIAGNOSIS — B952 Enterococcus as the cause of diseases classified elsewhere: Secondary | ICD-10-CM

## 2018-01-17 LAB — BASIC METABOLIC PANEL
ANION GAP: 11 (ref 5–15)
BUN: 14 mg/dL (ref 6–20)
CALCIUM: 9.3 mg/dL (ref 8.9–10.3)
CO2: 26 mmol/L (ref 22–32)
CREATININE: 0.54 mg/dL — AB (ref 0.61–1.24)
Chloride: 101 mmol/L (ref 101–111)
GFR calc Af Amer: >60 mL/min (ref >60)
GLUCOSE: 106 mg/dL — AB (ref 65–99)
Potassium: 3.8 mmol/L (ref 3.5–5.1)
Sodium: 138 mmol/L (ref 135–145)

## 2018-01-17 LAB — DIFFERENTIAL
Abs Immature Granulocytes: 0.1 10*3/uL (ref 0.0–0.1)
BASOS PCT: 0 %
Basophils Absolute: 0 10*3/uL (ref 0.0–0.1)
Eosinophils Absolute: 0.2 10*3/uL (ref 0.0–0.7)
Eosinophils Relative: 4 %
IMMATURE GRANULOCYTES: 1 %
LYMPHS PCT: 23 %
Lymphs Abs: 1.4 10*3/uL (ref 0.7–4.0)
Monocytes Absolute: 0.5 10*3/uL (ref 0.1–1.0)
Monocytes Relative: 8 %
NEUTROS ABS: 3.9 10*3/uL (ref 1.7–7.7)
Neutrophils Relative %: 64 %

## 2018-01-17 LAB — CULTURE, BLOOD (ROUTINE X 2)
CULTURE: NO GROWTH
Culture: NO GROWTH
SPECIAL REQUESTS: ADEQUATE
Special Requests: ADEQUATE

## 2018-01-17 LAB — MAGNESIUM: Magnesium: 1.8 mg/dL (ref 1.7–2.4)

## 2018-01-17 LAB — GLUCOSE, CAPILLARY
GLUCOSE-CAPILLARY: 165 mg/dL — AB (ref 65–99)
Glucose-Capillary: 110 mg/dL — ABNORMAL HIGH (ref 65–99)
Glucose-Capillary: 132 mg/dL — ABNORMAL HIGH (ref 65–99)
Glucose-Capillary: 151 mg/dL — ABNORMAL HIGH (ref 65–99)

## 2018-01-17 LAB — CBC
HCT: 35.4 % — ABNORMAL LOW (ref 39.0–52.0)
Hemoglobin: 11.2 g/dL — ABNORMAL LOW (ref 13.0–17.0)
MCH: 27.2 pg (ref 26.0–34.0)
MCHC: 31.6 g/dL (ref 30.0–36.0)
MCV: 85.9 fL (ref 78.0–100.0)
PLATELETS: 211 10*3/uL (ref 150–400)
RBC: 4.12 MIL/uL — AB (ref 4.22–5.81)
RDW: 17.2 % — ABNORMAL HIGH (ref 11.5–15.5)
WBC: 6.3 10*3/uL (ref 4.0–10.5)

## 2018-01-17 MED ORDER — APIXABAN 5 MG PO TABS: 5.0000 mg | ORAL_TABLET | Freq: Two times a day (BID) | ORAL | Status: DC

## 2018-01-17 MED ORDER — METOPROLOL TARTRATE 5 MG/5ML IV SOLN
5.0000 mg | Freq: Four times a day (QID) | INTRAVENOUS | Status: DC | PRN
  Administered 2018-01-17: 5 mg via INTRAVENOUS
  Filled 2018-01-17: qty 5

## 2018-01-17 MED ORDER — APIXABAN 5 MG PO TABS
5.0000 mg | ORAL_TABLET | Freq: Two times a day (BID) | ORAL | Status: DC
  Administered 2018-01-17 – 2018-01-24 (×15): 5 mg via ORAL
  Filled 2018-01-17 (×2): qty 1
  Filled 2018-01-17: qty 2
  Filled 2018-01-17 (×12): qty 1

## 2018-01-17 MED ORDER — SODIUM CHLORIDE 0.9 % IV SOLN: 3.0000 g | Freq: Four times a day (QID) | INTRAVENOUS | Status: DC

## 2018-01-17 MED ORDER — METOPROLOL TARTRATE 50 MG PO TABS
50.0000 mg | ORAL_TABLET | Freq: Two times a day (BID) | ORAL | Status: DC
  Administered 2018-01-17 – 2018-01-24 (×15): 50 mg via ORAL
  Filled 2018-01-17 (×15): qty 1

## 2018-01-17 MED ORDER — METOPROLOL TARTRATE 5 MG/5ML IV SOLN: 5.0000 mg | Freq: Four times a day (QID) | INTRAVENOUS | Status: DC | PRN

## 2018-01-17 MED ORDER — PANTOPRAZOLE SODIUM 40 MG PO TBEC
40.0000 mg | DELAYED_RELEASE_TABLET | Freq: Once | ORAL | Status: AC
  Administered 2018-01-17: 40 mg via ORAL
  Filled 2018-01-17: qty 1

## 2018-01-17 MED ORDER — METOPROLOL TARTRATE 50 MG PO TABS: 50.0000 mg | ORAL_TABLET | Freq: Two times a day (BID) | ORAL | Status: DC

## 2018-01-17 NOTE — Progress Notes (Signed)
NUTRITION NOTE  Consult per patient request for information about protein sources, specifically during breakfast. He expresses frustration of being on Heart Healthy/Carb Modified diet and limited choices available. Patient's wife was at bedside and also was a part of the conversation. She reports that patient has intentionally lost 60 lbs in the past year and that he typically eats his biggest meal at breakfast, skips or eats a small lunch, and has a medium-sized dinner meal.   Patient has been able to find protein options at other meals but breakfast is a particular concern. He has been tired of egg. Please allow patient to have [place] sausage at breakfast. Patient is agreeable to having yogurt and cottage cheese. Encouraged his wife to bring in unsalted nuts for patient to snack on and also informed her it would be okay to bring him brie cheese as this is something he enjoys and it is low in sodium.   Patient and wife expressed concern about meal times, limited menu, limited options for family/visitors, and other items surrounding hospital food/meals.      Trenton Gammon, MS, RD, LDN, [facility] Inpatient Clinical Dietitian Pager # [phone] After hours/weekend pager # [phone]

## 2018-01-17 NOTE — Progress Notes (Signed)
PROGRESS NOTE        PATIENT DETAILS Name: Joseph [name] Age: 67 y.o. Sex: male Date of Birth: [birth year] Admit Date: 01/11/2018 Admitting Physician Reyne Dumas, MD [id number], Early Chars, MD  Brief Narrative: Patient is a 66 y.o. 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  Briefly managed in the intensive care unit-upon stability, transfered to the triad hospitalist service on 5/22.  Further hospital course has been complicated by development of A. fib with RVR. Awaiting transfer to [facility] when bed available  Subjective: In A. fib with RVR this morning.  Denies any chest pain or shortness of breath.    Assessment/Plan: Septic shock secondary to polymicrobial bloodstream infection (enterococcus, CoNS and Clostridium perfringens) and left upper lobe pneumonia: Sepsis pathophysiology has resolved, he is clinically improved, infectious disease following with recommendations to complete 8 additional days of IV Unasyn from 8/24.   Neutropenia: Has had issues with recurrent neutropenia-as noted above, plans were to follow-up with his hematologist at [facility] for a bone marrow biopsy.  Unfortunately has had numerous recent hospitalizations for infection/sepsis in the past 6 months.  Neutropenia has improved-patient received 1 dose of Granix on 5/21.  Unlikely to have Felty syndrome as he does not have any evidence of splenomegaly. Dr. Sherral Hammers discussed with  patient's hematologist on 5/23-regarding timing of bone marrow biopsy.  He is awaiting bed at [facility] spoken with the transfer center at [facility] on 5/24-he was unlikely to have a bed over the weekend-likely to be transferred early next week.  A. fib with RVR: Occurred briefly while in the intensive care unit on 5/19-maintained sinus rhythm since then-only to revert back to A. fib with RVR earlier this morning.  Underwent ablation in 2018 by Dr. Lovena Le for atrial flutter.  Had spoken with EP service over the phone on 5/24-plans were to initiate anticoagulation and rate control agents if A. fib reoccurred-since it has-have started metoprolol and Eliquis.  Discussed case briefly with Dr. Debara Pickett over the phone on 5/25 as well.  If rate is not well controlled with metoprolol-may need Cardizem infusion.  Chronic hypoxic respiratory failure/COPD: Stable without any evidence of exacerbation.  CAD status post CABG 2007: no anginal symptoms-remains on aspirin  Chronic diastolic heart failure (EF 60-65% by TTE on 01/12/2018): Remains compensated  Rheumatoid arthritis: Continue prednisone daily.  Diarrhea: Has resolved-thought to have polymicrobial bloodstream infection due to possible bacterial translocation from diarrhea.  Recent C. difficile PCR on 5/4 was negative.  DM-2: CBGs remain stable with SSI.   Hyperlipidemia: Continue statin  Depression/insomnia: Appears stable-continue trazodone  Telemetry (independently reviewed): A. fib with RVR  DVT Prophylaxis:   Code Status: Full code  Family Communication: Spouse at bedside  Disposition Plan: Remain inpatient-transfer to [facility] when bed available  Antimicrobial agents: Anti-infectives (From admission, onward)   Start     Dose/Rate Route Frequency Ordered Stop   01/15/18 1430  Ampicillin-Sulbactam (UNASYN) 3  g in sodium chloride 0.9 % 100 mL IVPB  Status:  Discontinued     3 g 200 mL/hr over 30 Minutes Intravenous  Every 6 hours 01/15/18 1405 01/15/18 1421   01/15/18 1430  Ampicillin-Sulbactam (UNASYN) 3 g in sodium chloride 0.9 % 100 mL IVPB     3 g 200 mL/hr over 30 Minutes Intravenous Every 6 hours 01/15/18 1421 01/23/18 1429   01/13/18 2255  vancomycin (VANCOCIN) 1,250 mg in sodium chloride 0.9 % 250 mL IVPB  Status:  Discontinued     1,250 mg 166.7 mL/hr over 90 Minutes Intravenous Every 8 hours 01/13/18 1512 01/15/18 1405   01/12/18 0800  piperacillin-tazobactam (ZOSYN) IVPB 3.375 g  Status:  Discontinued     3.375 g 12.5 mL/hr over 240 Minutes Intravenous Every 8 hours 01/11/18 0040 01/13/18 1218   01/11/18 1400  vancomycin (VANCOCIN) 1,250 mg in sodium chloride 0.9 % 250 mL IVPB  Status:  Discontinued     1,250 mg 166.7 mL/hr over 90 Minutes Intravenous Every 12 hours 01/11/18 0046 01/13/18 1512   01/11/18 0045  vancomycin (VANCOCIN) IVPB 750 mg/150 ml premix     750 mg 150 mL/hr over 60 Minutes Intravenous  Once 01/11/18 0040 01/11/18 0218   01/11/18 0030  piperacillin-tazobactam (ZOSYN) IVPB 3.375 g     3.375 g 100 mL/hr over 30 Minutes Intravenous  Once 01/11/18 0016 01/11/18 0105   01/11/18 0030  vancomycin (VANCOCIN) IVPB 1000 mg/200 mL premix     1,000 mg 200 mL/hr over 60 Minutes Intravenous  Once 01/11/18 0016 01/11/18 0146      Procedures: None  CONSULTS:  pulmonary/intensive care and ID  Time spent: 25 minutes-Greater than 50% of this time was spent in counseling, explanation of diagnosis, planning of further management, and coordination of care.  MEDICATIONS: Scheduled Meds: . apixaban  5 mg Oral BID  . arformoterol  15 mcg Nebulization BID  . aspirin  81 mg Oral Daily  . budesonide (PULMICORT) nebulizer solution  0.5 mg Nebulization BID  . dextromethorphan-guaiFENesin  1 tablet Oral BID  . folic acid  1 mg Oral Daily  . insulin aspart  0-15 Units Subcutaneous TID WC  . insulin aspart  0-5 Units Subcutaneous QHS  . ipratropium-albuterol  3 mL Nebulization BID  .  mouth rinse  15 mL Mouth Rinse BID  . metoprolol tartrate  50 mg Oral BID  . pantoprazole  40 mg Oral Daily  . predniSONE  15 mg Oral Q breakfast  . pregabalin  75 mg Oral BID  . traZODone  25 mg Oral QHS  . vitamin C  500 mg Oral Daily   Continuous Infusions: . ampicillin-sulbactam (UNASYN) IV 3 g (01/17/18 0911)   PRN Meds:.metoprolol tartrate, nitroGLYCERIN, ondansetron (ZOFRAN) IV, oxyCODONE-acetaminophen   PHYSICAL EXAM: Vital signs: Vitals:   01/16/18 2055 01/17/18 0359 01/17/18 0751 01/17/18 0924  BP: 134/89 (!) 143/76  (!) 170/148  Pulse: 86 68  (!) 144  Resp: '20 16  18  ' Temp: 98.8 F (37.1 C) 98.6 F (37 C)  97.8 F (36.6 C)  TempSrc: Oral Oral  Oral  SpO2: 98% 98% 98% 100%  Weight: 90.6 kg (199 lb 11.8 oz)     Height:       Filed Weights   01/14/18 0500 01/16/18 0602 01/16/18 2055  Weight: 97.5 kg (214 lb 15.2 oz) 92.9 kg (204 lb 12.9 oz) 90.6 kg (199 lb 11.8 oz)   Body mass index is 25.64 kg/m.   General  appearance:Awake, alert, not in any distress.  Eyes:no scleral icterus. HEENT: Atraumatic and Normocephalic Neck: supple, no JVD. Resp:Good air entry bilaterally,no rales or rhonchi CVS: S1 S2 irregular, no murmurs.  GI: Bowel sounds present, Non tender and not distended with no gaurding, rigidity or rebound. Extremities: B/L Lower Ext shows no edema, both legs are warm to touch Neurology:  Non focal Psychiatric: Normal judgment and insight. Normal mood. Musculoskeletal:No digital cyanosis Skin:No Rash, warm and dry Wounds:N/A  I have personally reviewed following labs and imaging studies  LABORATORY DATA: CBC: Recent Labs  Lab 01/11/18 0014  01/13/18 0355 01/14/18 0240 01/15/18 0305 01/15/18 1537 01/16/18 0616 01/17/18 0415  WBC 5.8   < > 2.9* 5.8 10.9*  --  8.3 6.3  NEUTROABS 1.7  --  1.4* 4.5  --  10.2*  --  3.9  HGB 14.8   < > 8.9* 8.7* 9.7*  --  10.8* 11.2*  HCT 45.1   < > 27.6* 27.6* 30.2*  --  33.5* 35.4*  MCV 83.4   < > 84.7 84.7  84.6  --  85.5 85.9  PLT 218   < > 121* 107* 141*  --  168 211   < > = values in this interval not displayed.    Basic Metabolic Panel: Recent Labs  Lab 01/12/18 0654  01/13/18 1353 01/14/18 0240 01/14/18 1534 01/15/18 0305 01/15/18 1537 01/16/18 0616 01/17/18 0415  NA 144   < > 141 139  --  141  --  138 138  K 2.9*   < > 3.3* 3.0* 3.3* 3.4* 4.0 3.8 3.8  CL 111   < > 111 109  --  105  --  103 101  CO2 23   < > 21* 21*  --  26  --  26 26  GLUCOSE 149*   < > 153* 99  --  122*  --  100* 106*  BUN 9   < > 10 11  --  8  --  10 14  CREATININE 0.54*   < > 0.65 0.52*  --  0.47*  --  0.53* 0.54*  CALCIUM 8.3*   < > 8.4* 8.2*  --  8.5*  --  8.9 9.3  MG 1.6*  --   --   --  1.7 1.8 2.0 1.8 1.8  PHOS 3.6  --   --   --   --   --   --   --   --    < > = values in this interval not displayed.    GFR: Estimated Creatinine Clearance: 107 mL/min (A) (by C-G formula based on SCr of 0.54 mg/dL (L)).  Liver Function Tests: Recent Labs  Lab 01/11/18 0014  AST 32  ALT 23  ALKPHOS 119  BILITOT 1.0  PROT 7.3  ALBUMIN 3.5   No results for input(s): LIPASE, AMYLASE in the last 168 hours. No results for input(s): AMMONIA in the last 168 hours.  Coagulation Profile: Recent Labs  Lab 01/11/18 0014  INR 1.02    Cardiac Enzymes: Recent Labs  Lab 01/12/18 2214 01/13/18 0355 01/13/18 0958  TROPONINI 0.07* 0.07* 0.07*    BNP (last 3 results) No results for input(s): PROBNP in the last 8760 hours.  HbA1C: No results for input(s): HGBA1C in the last 72 hours.  CBG: Recent Labs  Lab 01/16/18 1212 01/16/18 1633 01/16/18 2107 01/17/18 0802 01/17/18 1203  GLUCAP 182* 154* 139* 110* 165*    Lipid Profile: No results  for input(s): CHOL, HDL, LDLCALC, TRIG, CHOLHDL, LDLDIRECT in the last 72 hours.  Thyroid Function Tests: No results for input(s): TSH, T4TOTAL, FREET4, T3FREE, THYROIDAB in the last 72 hours.  Anemia Panel: No results for input(s): VITAMINB12, FOLATE, FERRITIN,  TIBC, IRON, RETICCTPCT in the last 72 hours.  Urine analysis:    Component Value Date/Time   COLORURINE YELLOW 01/11/2018 0225   APPEARANCEUR CLEAR 01/11/2018 0225   LABSPEC 1.013 01/11/2018 0225   PHURINE 5.0 01/11/2018 0225   GLUCOSEU NEGATIVE 01/11/2018 0225   HGBUR NEGATIVE 01/11/2018 0225   HGBUR negative 04/12/2008 1103   BILIRUBINUR NEGATIVE 01/11/2018 0225   KETONESUR NEGATIVE 01/11/2018 0225   PROTEINUR NEGATIVE 01/11/2018 0225   UROBILINOGEN 0.2 10/17/2012 1236   NITRITE NEGATIVE 01/11/2018 0225   LEUKOCYTESUR NEGATIVE 01/11/2018 0225    Sepsis Labs: Lactic Acid, Venous    Component Value Date/Time   LATICACIDVEN 2.4 ([place]) 01/11/2018 2234    MICROBIOLOGY: Recent Results (from the past 240 hour(s))  Blood Culture (routine x 2)     Status: Abnormal   Collection Time: 01/11/18 12:15 AM  Result Value Ref Range Status   Specimen Description BLOOD LEFT ANTECUBITAL  Final   Special Requests   Final    BOTTLES DRAWN AEROBIC AND ANAEROBIC Blood Culture adequate volume   Culture  Setup Time   Final    GRAM POSITIVE COCCI IN BOTH AEROBIC AND ANAEROBIC BOTTLES CRITICAL RESULT CALLED TO, READ BACK BY AND VERIFIED WITH: ESINCLAIR,PHARMD '@1900'  01/11/18 BY LHOWARD    Culture (A)  Final    STAPHYLOCOCCUS EPIDERMIDIS STAPHYLOCOCCUS SIMULANS THE SIGNIFICANCE OF ISOLATING THIS ORGANISM FROM A SINGLE SET OF BLOOD CULTURES WHEN MULTIPLE SETS ARE DRAWN IS UNCERTAIN. PLEASE NOTIFY THE MICROBIOLOGY DEPARTMENT WITHIN ONE WEEK IF SPECIATION AND SENSITIVITIES ARE REQUIRED. Performed at [facility], [place] [address], [place], [place] [address]    Report Status 01/16/2018 FINAL  Final  Blood Culture ID Panel (Reflexed)     Status: Abnormal   Collection Time: 01/11/18 12:15 AM  Result Value Ref Range Status   Enterococcus species NOT DETECTED NOT DETECTED Final   Listeria monocytogenes NOT DETECTED NOT DETECTED Final   Staphylococcus species DETECTED (A) NOT DETECTED Final     Comment: Methicillin (oxacillin) resistant coagulase negative staphylococcus. Possible blood culture contaminant (unless isolated from more than one blood culture draw or clinical case suggests pathogenicity). No antibiotic treatment is indicated for blood  culture contaminants. CRITICAL RESULT CALLED TO, READ BACK BY AND VERIFIED WITH: ESINCLAIR,PHARMD '@1900'  01/11/18 BY LHOWARD    Staphylococcus aureus NOT DETECTED NOT DETECTED Final   Methicillin resistance DETECTED (A) NOT DETECTED Final    Comment: CRITICAL RESULT CALLED TO, READ BACK BY AND VERIFIED WITH: ESINCLAIR,PHARMD '@1900'  01/11/18 BY LHOWARD    Streptococcus species NOT DETECTED NOT DETECTED Final   Streptococcus agalactiae NOT DETECTED NOT DETECTED Final   Streptococcus pneumoniae NOT DETECTED NOT DETECTED Final   Streptococcus pyogenes NOT DETECTED NOT DETECTED Final   Acinetobacter baumannii NOT DETECTED NOT DETECTED Final   Enterobacteriaceae species NOT DETECTED NOT DETECTED Final   Enterobacter cloacae complex NOT DETECTED NOT DETECTED Final   Escherichia coli NOT DETECTED NOT DETECTED Final   Klebsiella oxytoca NOT DETECTED NOT DETECTED Final   Klebsiella pneumoniae NOT DETECTED NOT DETECTED Final   Proteus species NOT DETECTED NOT DETECTED Final   Serratia marcescens NOT DETECTED NOT DETECTED Final   Haemophilus influenzae NOT DETECTED NOT DETECTED Final   Neisseria meningitidis NOT DETECTED NOT DETECTED Final  Pseudomonas aeruginosa NOT DETECTED NOT DETECTED Final   Candida albicans NOT DETECTED NOT DETECTED Final   Candida glabrata NOT DETECTED NOT DETECTED Final   Candida krusei NOT DETECTED NOT DETECTED Final   Candida parapsilosis NOT DETECTED NOT DETECTED Final   Candida tropicalis NOT DETECTED NOT DETECTED Final    Comment: Performed at [facility], [place] [address], [place], [place] [address]  Blood Culture (routine x 2)     Status: Abnormal (Preliminary result)   Collection Time: 01/11/18 12:20 AM    Result Value Ref Range Status   Specimen Description BLOOD RIGHT FOREARM  Final   Special Requests   Final    BOTTLES DRAWN AEROBIC AND ANAEROBIC Blood Culture adequate volume   Culture  Setup Time   Final    GRAM POSITIVE RODS ANAEROBIC BOTTLE ONLY CRITICAL RESULT CALLED TO, READ BACK BY AND VERIFIED WITH: E SINCLAIR,PHARMD AT 1157 01/11/18 BY L BENFIELD GRAM POSITIVE COCCI AEROBIC BOTTLE ONLY CRITICAL VALUE NOTED.  VALUE IS CONSISTENT WITH PREVIOUSLY REPORTED AND CALLED VALUE. Performed at [facility], [place] [address], [place], [place] [address]    Culture (A)  Final    ENTEROCOCCUS FAECALIS STAPHYLOCOCCUS HAEMOLYTICUS THE SIGNIFICANCE OF ISOLATING THIS ORGANISM FROM A SINGLE SET OF BLOOD CULTURES WHEN MULTIPLE SETS ARE DRAWN IS UNCERTAIN. PLEASE NOTIFY THE MICROBIOLOGY DEPARTMENT WITHIN ONE WEEK IF SPECIATION AND SENSITIVITIES ARE REQUIRED. CLOSTRIDIUM PERFRINGENS    Report Status PENDING  Incomplete   Organism ID, Bacteria ENTEROCOCCUS FAECALIS  Final      Susceptibility   Enterococcus faecalis - MIC*    AMPICILLIN <=2 SENSITIVE Sensitive     VANCOMYCIN 2 SENSITIVE Sensitive     GENTAMICIN SYNERGY SENSITIVE Sensitive     * ENTEROCOCCUS FAECALIS  Blood Culture ID Panel (Reflexed)     Status: None   Collection Time: 01/11/18 12:20 AM  Result Value Ref Range Status   Enterococcus species NOT DETECTED NOT DETECTED Final   Listeria monocytogenes NOT DETECTED NOT DETECTED Final   Staphylococcus species NOT DETECTED NOT DETECTED Final   Staphylococcus aureus NOT DETECTED NOT DETECTED Final   Streptococcus species NOT DETECTED NOT DETECTED Final   Streptococcus agalactiae NOT DETECTED NOT DETECTED Final   Streptococcus pneumoniae NOT DETECTED NOT DETECTED Final   Streptococcus pyogenes NOT DETECTED NOT DETECTED Final   Acinetobacter baumannii NOT DETECTED NOT DETECTED Final   Enterobacteriaceae species NOT DETECTED NOT DETECTED Final   Enterobacter cloacae complex NOT  DETECTED NOT DETECTED Final   Escherichia coli NOT DETECTED NOT DETECTED Final   Klebsiella oxytoca NOT DETECTED NOT DETECTED Final   Klebsiella pneumoniae NOT DETECTED NOT DETECTED Final   Proteus species NOT DETECTED NOT DETECTED Final   Serratia marcescens NOT DETECTED NOT DETECTED Final   Haemophilus influenzae NOT DETECTED NOT DETECTED Final   Neisseria meningitidis NOT DETECTED NOT DETECTED Final   Pseudomonas aeruginosa NOT DETECTED NOT DETECTED Final   Candida albicans NOT DETECTED NOT DETECTED Final   Candida glabrata NOT DETECTED NOT DETECTED Final   Candida krusei NOT DETECTED NOT DETECTED Final   Candida parapsilosis NOT DETECTED NOT DETECTED Final   Candida tropicalis NOT DETECTED NOT DETECTED Final    Comment: Performed at [facility], [address], [place], [place] [address]  Urine culture     Status: None   Collection Time: 01/11/18  2:28 AM  Result Value Ref Range Status   Specimen Description URINE, CLEAN CATCH  Final   Special Requests NONE  Final   Culture   Final    NO GROWTH Performed at [facility], [place] [address], [place], [place] [address]    Report Status 01/12/2018 FINAL  Final  Culture, blood (routine x 2)     Status: None   Collection Time: 01/12/18  6:40 AM  Result Value Ref Range Status   Specimen Description BLOOD LEFT HAND  Final   Special Requests   Final    BOTTLES DRAWN AEROBIC ONLY Blood Culture adequate volume   Culture   Final    NO GROWTH 5 DAYS Performed at [facility], [place] [address] [place], [place] [address]    Report Status 01/17/2018 FINAL  Final  Culture, blood (routine x 2)     Status: None   Collection Time: 01/12/18  6:49 AM  Result Value Ref Range Status   Specimen Description BLOOD RIGHT HAND  Final   Special Requests   Final    BOTTLES DRAWN AEROBIC ONLY Blood Culture adequate volume   Culture   Final    NO GROWTH 5 DAYS Performed at [facility], [place] [address], [place], [place] [address]     Report Status 01/17/2018 FINAL  Final  Culture, blood (routine x 2)     Status: None (Preliminary result)   Collection Time: 01/13/18  2:28 PM  Result Value Ref Range Status   Specimen Description BLOOD LEFT HAND  Final   Special Requests   Final    BOTTLES DRAWN AEROBIC ONLY Blood Culture results may not be optimal due to an inadequate volume of blood received in culture bottles   Culture   Final    NO GROWTH 4 DAYS Performed at [facility], [place] [address] [place], [place] [address]    Report Status PENDING  Incomplete  Culture, blood (routine x 2)     Status: None (Preliminary result)   Collection Time: 01/13/18  2:32 PM  Result Value Ref Range Status   Specimen Description BLOOD RIGHT HAND  Final   Special Requests   Final    BOTTLES DRAWN AEROBIC AND ANAEROBIC Blood Culture adequate volume   Culture   Final    NO GROWTH 4 DAYS Performed at [facility], [place] [address], [place], [place] [address]    Report Status PENDING  Incomplete    RADIOLOGY STUDIES/RESULTS: Dg Chest 2 View  Result Date: 01/02/2018 CLINICAL DATA:  Dyspnea. Re-evaluate left upper lobe cavitary lesion. EXAM: CHEST - 2 VIEW COMPARISON:  12/27/2017. FINDINGS: Stable enlarged cardiac silhouette and post CABG changes. The previously seen 7.6 cm left upper lobe cavitary lesion again measures 7.6 cm in maximum diameter. The this has interval increased density. Adjacent patchy opacity is unchanged. Right upper lobe bullous changes again demonstrated. The interstitial markings remain prominent. Diffuse osteopenia. IMPRESSION: 1. Interval fluid accumulation within the previously demonstrated left upper lobe cavitary lesion. 2. Stable mild adjacent patchy pneumonia or atelectasis. 3. Stable cardiomegaly, chronic interstitial lung disease and right upper lobe bullous changes. Electronically Signed   By: Claudie Revering M.D.   On: 01/02/2018 12:55   Dg Chest 2 View  Result Date: 12/22/2017 CLINICAL DATA:   Rheumatoid flare up with fever for 2 days. Ex-smoker. EXAM: CHEST - 2 VIEW COMPARISON:  Radiographs 10/27/2017 and 11/08/2017.  CT 08/14/2017. FINDINGS: The heart size and mediastinal contours are stable status post median sternotomy and CABG. There is aortic atherosclerosis. Diffuse central airway thickening is present. There are multiple  upper lobe bulla. Dominant left upper lobe lesion demonstrates interval increased wall thickening which may be secondary to superimposed infection or hemorrhage. There is no focal airspace disease, pleural effusion or pneumothorax. No acute osseous findings are evident. IMPRESSION: Interval increased wall thickening of dominant left upper lobe bulla, possibly due to super infection or hemorrhage. Diffuse central airway thickening. No focal airspace disease. Electronically Signed   By: Richardean Sale M.D.   On: 12/22/2017 10:50   Ct Chest W Contrast  Result Date: 01/11/2018 CLINICAL DATA:  Acute onset of generalized weakness and fever. Decreased oral intake. EXAM: CT CHEST WITH CONTRAST TECHNIQUE: Multidetector CT imaging of the chest was performed during intravenous contrast administration. CONTRAST:  161m OMNIPAQUE IOHEXOL 300 MG/ML  SOLN COMPARISON:  Chest radiograph performed earlier today at 12:22 a.m., and CT of the chest performed 12/22/2017 FINDINGS: Cardiovascular: The heart is normal in size. Diffuse coronary artery calcifications are seen. Scattered calcification noted along the thoracic aorta and proximal great vessels. The descending thoracic aorta is borderline normal in caliber. Mediastinum/Nodes: The patient is status post median sternotomy. Visualized mediastinal nodes remain borderline normal in size. No pericardial effusion is identified. The thyroid gland is unremarkable. No axillary lymphadenopathy is seen. Lungs/Pleura: Multiple cavitations are noted at the left lung apex, with new air-fluid levels, concerning for atypical infection superimposed on the  patient's chronic bullous disease. Peripheral honeycombing is noted bilaterally. Mild interstitial prominence may reflect mild interstitial edema. No definite pleural effusion or pneumothorax is seen. Upper Abdomen: The visualized portions of the liver and spleen are unremarkable. The patient is status post cholecystectomy, with a clip noted at the gallbladder fossa. The visualized portions of the spleen are unremarkable. A tiny hiatal hernia is noted. Musculoskeletal: No acute osseous abnormalities are identified. The visualized musculature is unremarkable in appearance. IMPRESSION: 1. Multiple cavitations noted at the left lung apex, with new air-fluid levels, concerning for atypical infection superimposed on chronic bullous disease. 2. Mild interstitial prominence may reflect mild interstitial edema. 3. Diffuse coronary artery calcifications seen. 4. Tiny hiatal hernia noted. Electronically Signed   By: JGarald BaldingM.D.   On: 01/11/2018 04:23   Ct Chest W Contrast  Result Date: 12/22/2017 CLINICAL DATA:  Fever EXAM: CT CHEST WITH CONTRAST TECHNIQUE: Multidetector CT imaging of the chest was performed during intravenous contrast administration. CONTRAST:  853mISOVUE-300 IOPAMIDOL (ISOVUE-300) INJECTION 61% COMPARISON:  Chest radiograph December 22, 2016; chest CT August 14, 2017 FINDINGS: Cardiovascular: There is no thoracic aortic aneurysm or dissection. There is calcification at the origins of the left common carotid and left subclavian arteries. Mild scattered calcification is noted elsewhere the great vessels. There is aortic atherosclerosis. There are foci of native coronary artery calcification. The patient is status post coronary artery bypass grafting. There is no major vessel pulmonary embolus. No pericardial effusion or pericardial thickening is evident. Mediastinum/Nodes: Thyroid appears normal. There are multiple subcentimeter mediastinal lymph nodes. There is a subcarinal lymph node measuring  1.3 x 1.0 cm. There is a second sub- carinal lymph node measuring 1.2 x 1.0 cm. There is a small hiatal hernia. Lungs/Pleura: There is underlying bullous emphysematous change. Extensive bullous disease is noted in both upper lobes. In comparison with the previous study, there is a bulla in the posterior segment left upper lobe measuring 7.7 x 4.6 cm. The wall of this bulla has become considerably thicker compared to most recent study. There is also thickening along the nearby major fissure with localized consolidation adjacent to the major  fissure in the posterior segment of the left upper lobe. There is no other area of consolidation. There are areas of scarring and atelectatic change in the lung bases. There are scattered areas of fibrosis which appear stable. There is a nodular opacity along the right major fissure measuring 9 x 5 mm, stable and likely representing an intrafissural lymph node. Upper Abdomen: Gallbladder is absent. Spleen is incompletely visualized. Spleen measures 13.8 cm from anterior to posterior dimension which is prominent. There is upper abdominal aortic atherosclerosis. Musculoskeletal: No blastic or lytic bone lesions are evident. Patient is status post median sternotomy. No chest wall lesions are evident. IMPRESSION: 1. 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 These changes are similar compared to prior study. Note, however, that the largest bulla, located in the posterior segment left upper lobe, shows considerable wall thickening, a change from the previous study. There is a nearby area of airspace consolidation in the posterior segment of the left upper lobe adjacent to the major fissure, felt to represent pneumonia. The thickening of the wall of the bulla is most likely due to infection of this bulla. Atypical cystic neoplasm may present in this manner and cannot be entirely excluded at this time. Note that there is no evidence of mycetoma  in this bulla or elsewhere. Given this circumstance, would advise repeat CT after treatment for pneumonia to assess for stability or possible resolution of wall thickening in this bulla. Given the possibility of underlying neoplasm, close clinical and imaging surveillance advised in this regard. 2. Mildly prominent sub-carinal lymph nodes of uncertain etiology. These lymph nodes could well be reactive given the parenchymal lung changes. 3. Status post coronary artery bypass grafting. There is aortic and great vessel atherosclerotic calcification as well as calcification in native coronary arteries. 4.  Small hiatal hernia. 5.  Spleen appears enlarged although incompletely visualized. 6.  Gallbladder surgically absent. Aortic Atherosclerosis (ICD10-I70.0) and Emphysema (ICD10-J43.9). Electronically Signed   By: Lowella Grip III M.D.   On: 12/22/2017 13:17   Ct Abdomen Pelvis W Contrast  Result Date: 01/13/2018 CLINICAL DATA:  Painless diarrhea EXAM: CT ABDOMEN AND PELVIS WITH CONTRAST TECHNIQUE: Multidetector CT imaging of the abdomen and pelvis was performed using the standard protocol following bolus administration of intravenous contrast. CONTRAST:  156m OMNIPAQUE IOHEXOL 300 MG/ML  SOLN COMPARISON:  08/14/2017 FINDINGS: LOWER CHEST: Small left pleural effusion. Emphysema and bibasilar atelectasis. HEPATOBILIARY: Normal hepatic contours and density. No intra- or extrahepatic biliary dilatation. Status post cholecystectomy. PANCREAS: Normal parenchymal contours without ductal dilatation. No peripancreatic fluid collection. SPLEEN: Normal. ADRENALS/URINARY TRACT: --Adrenal glands: Normal. --Right kidney/ureter: Multiple nonobstructing renal stones measuring up 2 7 mm. No hydronephrosis. No ureterolithiasis. --Left kidney/ureter: Tiny non-obstructing 1-2 mm stone at the lower pole. No hydronephrosis. --Urinary bladder: Normal for degree of distention STOMACH/BOWEL: --Stomach/Duodenum: No hiatal hernia or  other gastric abnormality. Normal duodenal course. --Small bowel: No dilatation or inflammation. --Colon: There is a fecal management system in the rectum. There is sigmoid diverticulosis without acute inflammation. The remainder of the colon is normal. --Appendix: Normal. VASCULAR/LYMPHATIC: Atherosclerotic calcification is present within the non-aneurysmal abdominal aorta, without hemodynamically significant stenosis. The portal vein, splenic vein, superior mesenteric vein and IVC are patent. No abdominal or pelvic lymphadenopathy. REPRODUCTIVE: Normal prostate and seminal vesicles. MUSCULOSKELETAL. No bony spinal canal stenosis or focal osseous abnormality. OTHER: None. IMPRESSION: 1. No acute abnormality of the abdomen or pelvis. 2. Small left pleural effusion. 3. Bilateral nonobstructive nephrolithiasis. 4. Aortic Atherosclerosis (  ICD10-I70.0) and Emphysema (ICD10-J43.9). Electronically Signed   By: Ulyses Jarred M.D.   On: 01/13/2018 17:57   Dg Chest Port 1 View  Result Date: 01/11/2018 CLINICAL DATA:  Sepsis, fever and weakness.  Dyspnea x1 day. EXAM: PORTABLE CHEST 1 VIEW COMPARISON:  Chest CT 12/22/2017, CXR 01/02/2018 FINDINGS: Stable cardiomegaly with post CABG change and aortic atherosclerosis. Redemonstration of left apical bullae, the largest is partially filled with fluid and thick-walled compatible with an infected bulla. This is slightly smaller in appearance than on prior more cavitary in appearance than recent comparison chest radiograph. Subsegmental scarring is seen in the right upper lobe and mid lung. Probable trace right effusion. No acute osseous abnormality. IMPRESSION: 1. Cardiomegaly with aortic atherosclerosis and post CABG change. 2. Thick-walled left upper lobe bulla compatible with an infected bulla is redemonstrated though slightly more cavitary in appearance and less opacified than on prior comparison. This would suggest some partial clearing of fluid within. Electronically  Signed   By: Ashley Royalty M.D.   On: 01/11/2018 00:46   Dg Chest Port 1 View  Result Date: 12/27/2017 CLINICAL DATA:  Sepsis. EXAM: PORTABLE CHEST 1 VIEW COMPARISON:  Chest radiograph 12/27/2017, chest CT 12/22/2017 FINDINGS: Postsurgical changes from CABG. Cardiomediastinal silhouette is normal. Mediastinal contours appear intact. Emphysema, chronic interstitial lung changes. Previously noted thick-walled cystic structure in the left upper lobe has increased in size no measuring 7.6 cm, with increased surrounding airspace consolidation. Osseous structures are without acute abnormality. Soft tissues are grossly normal. IMPRESSION: Known left upper lobe thick-walled cavitary lesion has increased in size with increased surrounding airspace consolidation. Emphysema with chronic interstitial lung changes. Electronically Signed   By: Fidela Salisbury M.D.   On: 12/27/2017 17:34   Dg Chest Port 1 View  Result Date: 12/24/2017 CLINICAL DATA:  Pneumonia. EXAM: PORTABLE CHEST 1 VIEW COMPARISON:  CT scan and radiographs of December 22, 2017. FINDINGS: Stable cardiomediastinal silhouette. Status post coronary artery bypass graft. No pneumothorax is noted. Stable scarring is seen throughout both lungs, most prominently seen in right upper lobe. Left upper lobe bulla is again noted with significant wall thickening consistent with infection and surrounding pneumonia. Bony thorax is unremarkable. IMPRESSION: Left upper lobe bulla is again noted with significant wall thickening consistent with infection and surrounding pneumonia as described on prior CT scan. Electronically Signed   By: Marijo Conception, M.D.   On: 12/24/2017 10:00   Dg Hips Bilat With Pelvis 2v  Result Date: 01/07/2018 CLINICAL DATA:  Pain in both hips for a week EXAM: DG HIP (WITH OR WITHOUT PELVIS) 2V BILAT COMPARISON:  CT 08/14/2017 FINDINGS: Mild SI joint degenerative changes. Pubic symphysis and rami are intact. 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 No fracture or malalignment. Vascular calcifications. IMPRESSION: Mild arthritis of the hips.  No acute osseous abnormality Electronically Signed   By: Donavan Foil M.D.   On: 01/07/2018 20:25     LOS: 6 days   Oren Binet, MD  [facility]  If 7PM-7AM, please contact night-coverage  Please page via [email or web address] on MD name and type text message  01/17/2018, 2:36 PM

## 2018-01-17 NOTE — Progress Notes (Signed)
Subjective:  And wife have several concerns namely about the neutrophil count which was not on the differential today but which I have now added also about the neutropenic precautions and desire to be transferred to [facility] ( which I completely understand.)   Antibiotics:  Anti-infectives (From admission, onward)   Start     Dose/Rate Route Frequency Ordered Stop   01/15/18 1430  Ampicillin-Sulbactam (UNASYN) 3 g in sodium chloride 0.9 % 100 mL IVPB  Status:  Discontinued     3 g 200 mL/hr over 30 Minutes Intravenous Every 6 hours 01/15/18 1405 01/15/18 1421   01/15/18 1430  Ampicillin-Sulbactam (UNASYN) 3 g in sodium chloride 0.9 % 100 mL IVPB     3 g 200 mL/hr over 30 Minutes Intravenous Every 6 hours 01/15/18 1421 01/23/18 1429   01/13/18 2255  vancomycin (VANCOCIN) 1,250 mg in sodium chloride 0.9 % 250 mL IVPB  Status:  Discontinued     1,250 mg 166.7 mL/hr over 90 Minutes Intravenous Every 8 hours 01/13/18 1512 01/15/18 1405   01/12/18 0800  piperacillin-tazobactam (ZOSYN) IVPB 3.375 g  Status:  Discontinued     3.375 g 12.5 mL/hr over 240 Minutes Intravenous Every 8 hours 01/11/18 0040 01/13/18 1218   01/11/18 1400  vancomycin (VANCOCIN) 1,250 mg in sodium chloride 0.9 % 250 mL IVPB  Status:  Discontinued     1,250 mg 166.7 mL/hr over 90 Minutes Intravenous Every 12 hours 01/11/18 0046 01/13/18 1512   01/11/18 0045  vancomycin (VANCOCIN) IVPB 750 mg/150 ml premix     750 mg 150 mL/hr over 60 Minutes Intravenous  Once 01/11/18 0040 01/11/18 0218   01/11/18 0030  piperacillin-tazobactam (ZOSYN) IVPB 3.375 g     3.375 g 100 mL/hr over 30 Minutes Intravenous  Once 01/11/18 0016 01/11/18 0105   01/11/18 0030  vancomycin (VANCOCIN) IVPB 1000 mg/200 mL premix     1,000 mg 200 mL/hr over 60 Minutes Intravenous  Once 01/11/18 0016 01/11/18 0146      Medications: Scheduled Meds: . apixaban  5 mg Oral BID  . arformoterol  15 mcg Nebulization BID  . aspirin  81 mg Oral  Daily  . budesonide (PULMICORT) nebulizer solution  0.5 mg Nebulization BID  . dextromethorphan-guaiFENesin  1 tablet Oral BID  . folic acid  1 mg Oral Daily  . insulin aspart  0-15 Units Subcutaneous TID WC  . insulin aspart  0-5 Units Subcutaneous QHS  . ipratropium-albuterol  3 mL Nebulization BID  . mouth rinse  15 mL Mouth Rinse BID  . metoprolol tartrate  50 mg Oral BID  . pantoprazole  40 mg Oral Daily  . predniSONE  15 mg Oral Q breakfast  . pregabalin  75 mg Oral BID  . traZODone  25 mg Oral QHS  . vitamin C  500 mg Oral Daily   Continuous Infusions: . ampicillin-sulbactam (UNASYN) IV 3 g (01/17/18 0911)   PRN Meds:.metoprolol tartrate, nitroGLYCERIN, ondansetron (ZOFRAN) IV, oxyCODONE-acetaminophen    Objective: Weight change: -5 lb 1.1 oz (-2.3 kg)  Intake/Output Summary (Last 24 hours) at 01/17/2018 1141 Last data filed at 01/17/2018 0600 Gross per 24 hour  Intake 440 ml  Output 1590 ml  Net -1150 ml   Blood pressure (!) 170/148, pulse (!) 144, temperature 97.8 F (36.6 C), temperature source Oral, resp. rate 18, height '6\' 2"'  (1.88 m), weight 199 lb 11.8 oz (90.6 kg), SpO2 100 %. Temp:  [97.8 F (36.6 C)-98.8 F (  37.1 C)] 97.8 F (36.6 C) (05/25 0924) Pulse Rate:  [66-144] 144 (05/25 0924) Resp:  [16-20] 18 (05/25 0924) BP: (106-170)/(55-148) 170/148 (05/25 0924) SpO2:  [98 %-100 %] 100 % (05/25 0924) Weight:  [199 lb 11.8 oz (90.6 kg)] 199 lb 11.8 oz (90.6 kg) (05/24 2055)  Physical Exam: General: Alert and awake, oriented x3, not in any acute distress. HEENT: anicteric sclera, EOMI CVS irrr irregular rate,  Chest: , no wheezing, rales or resp distress Abdomen: soft nontender, nondistended,  Extremities: RA changes Skin: mx bruises Neuro: nonfocal  CBC: CBC Latest Ref Rng & Units 01/17/2018 01/16/2018 01/15/2018  WBC 4.0 - 10.5 K/uL 6.3 8.3 10.9(H)  Hemoglobin 13.0 - 17.0 g/dL 11.2(L) 10.8(L) 9.7(L)  Hematocrit 39.0 - 52.0 % 35.4(L) 33.5(L) 30.2(L)    Platelets 150 - 400 K/uL 211 168 141(L)      BMET Recent Labs    01/16/18 0616 01/17/18 0415  NA 138 138  K 3.8 3.8  CL 103 101  CO2 26 26  GLUCOSE 100* 106*  BUN 10 14  CREATININE 0.53* 0.54*  CALCIUM 8.9 9.3     Liver Panel  No results for input(s): PROT, ALBUMIN, AST, ALT, ALKPHOS, BILITOT, BILIDIR, IBILI in the last 72 hours.     Sedimentation Rate No results for input(s): ESRSEDRATE in the last 72 hours. C-Reactive Protein No results for input(s): CRP in the last 72 hours.  Micro Results: Recent Results (from the past 720 hour(s))  Blood Culture (routine x 2)     Status: None   Collection Time: 12/22/17 10:46 AM  Result Value Ref Range Status   Specimen Description   Final    BLOOD LEFT ARM Performed at [facility], [address] [place], [place] [address]    Special Requests   Final    BOTTLES DRAWN AEROBIC AND ANAEROBIC Blood Culture adequate volume Performed at [facility], [address] [place], [place] [address]    Culture   Final    NO GROWTH 5 DAYS Performed at [facility], [place] [address], [place], [place] [address]    Report Status 12/27/2017 FINAL  Final  Blood Culture (routine x 2)     Status: None   Collection Time: 12/22/17 10:55 AM  Result Value Ref Range Status   Specimen Description   Final    BLOOD LEFT HAND Performed at [facility], [address] [place], [place] [address]    Special Requests   Final    BOTTLES DRAWN AEROBIC AND ANAEROBIC Blood Culture adequate volume Performed at [facility], [address] [place], [place] [address]    Culture   Final    NO GROWTH 5 DAYS Performed at [facility], [place] [address], [place], [place] [address]    Report Status 12/27/2017 FINAL  Final  MRSA PCR Screening     Status: None   Collection Time: 12/22/17  8:41 PM  Result Value Ref Range Status   MRSA by PCR NEGATIVE NEGATIVE Final    Comment:        The GeneXpert  MRSA Assay (FDA approved for NASAL specimens only), is one component of a comprehensive MRSA colonization surveillance program. It is not intended to diagnose MRSA infection nor to guide or monitor treatment for MRSA infections. Performed at [facility], [place] [address] [place], [place] [address]   Culture, sputum-assessment     Status:  None   Collection Time: 12/23/17  1:10 AM  Result Value Ref Range Status   Specimen Description SPUTUM  Final   Special Requests NONE  Final   Sputum evaluation   Final    THIS SPECIMEN IS ACCEPTABLE FOR SPUTUM CULTURE Performed at [facility] [place] [address] [place], [place] [address]    Report Status 12/23/2017 FINAL  Final  Culture, respiratory (NON-Expectorated)     Status: None   Collection Time: 12/23/17  1:10 AM  Result Value Ref Range Status   Specimen Description   Final    SPUTUM Performed at [facility], [place] [address] [place], [place] [address]    Special Requests   Final    NONE Reflexed from [id number] Performed at [place] [address], [place], [place] [address]    Gram Stain   Final    FEW WBC PRESENT, PREDOMINANTLY PMN RARE GRAM POSITIVE COCCI    Culture   Final    FEW Consistent with normal respiratory flora. Performed at [facility], [place] [address], [place], [place] [address]    Report Status 12/25/2017 FINAL  Final  Blood Culture (routine x 2)     Status: None   Collection Time: 12/27/17  5:21 PM  Result Value Ref Range Status   Specimen Description BLOOD LEFT FOREARM  Final   Special Requests   Final    BOTTLES DRAWN AEROBIC AND ANAEROBIC Blood Culture adequate volume   Culture   Final    NO GROWTH 5 DAYS Performed at [facility], [place] [address] [place], [place] [address]    Report Status 01/01/2018 FINAL  Final  Blood Culture (routine x 2)     Status: None   Collection Time: 12/27/17  5:22 PM  Result Value Ref  Range Status   Specimen Description BLOOD RIGHT WRIST  Final   Special Requests   Final    BOTTLES DRAWN AEROBIC AND ANAEROBIC Blood Culture adequate volume   Culture   Final    NO GROWTH 5 DAYS Performed at [facility], [place] [address], [place],  [address]    Report Status 01/01/2018 FINAL  Final  C difficile quick scan w PCR reflex     Status: None   Collection Time: 12/27/17  8:10 PM  Result Value Ref Range Status   C Diff antigen NEGATIVE NEGATIVE Final   C Diff toxin NEGATIVE NEGATIVE Final   C Diff interpretation No C. difficile detected.  Final    Comment: Performed at [facility], [place] [address], [place],  [address]  Gastrointestinal Panel by PCR , Stool     Status: None   Collection Time: 12/27/17  8:10 PM  Result Value Ref Range Status   Campylobacter species NOT DETECTED NOT DETECTED Final   Plesimonas shigelloides NOT DETECTED NOT DETECTED Final   Salmonella species NOT DETECTED NOT DETECTED Final   Yersinia enterocolitica NOT DETECTED NOT DETECTED Final   Vibrio species NOT DETECTED NOT DETECTED Final   Vibrio cholerae NOT DETECTED NOT DETECTED Final   Enteroaggregative E coli (EAEC) NOT DETECTED NOT DETECTED Final   Enteropathogenic E coli (EPEC) NOT DETECTED NOT DETECTED Final   Enterotoxigenic E coli (ETEC) NOT DETECTED NOT DETECTED Final   Shiga like toxin producing E coli (STEC) NOT DETECTED NOT DETECTED Final   Shigella/Enteroinvasive E coli (EIEC) NOT DETECTED NOT DETECTED Final   Cryptosporidium NOT DETECTED NOT DETECTED Final  Cyclospora cayetanensis NOT DETECTED NOT DETECTED Final   Entamoeba histolytica NOT DETECTED NOT DETECTED Final   Giardia lamblia NOT DETECTED NOT DETECTED Final   Adenovirus F40/41 NOT DETECTED NOT DETECTED Final   Astrovirus NOT DETECTED NOT DETECTED Final   Norovirus GI/GII NOT DETECTED NOT DETECTED Final   Rotavirus A NOT DETECTED NOT DETECTED Final   Sapovirus (I, II, IV, and V) NOT DETECTED NOT  DETECTED Final    Comment: Performed at [facility], [place] [address] [place], [place] [address]  MRSA PCR Screening     Status: None   Collection Time: 12/28/17  5:27 PM  Result Value Ref Range Status   MRSA by PCR NEGATIVE NEGATIVE Final    Comment:        The GeneXpert MRSA Assay (FDA approved for NASAL specimens only), is one component of a comprehensive MRSA colonization surveillance program. It is not intended to diagnose MRSA infection nor to guide or monitor treatment for MRSA infections. Performed at [facility], [place] [address] [place], [place] [address]   Culture, expectorated sputum-assessment     Status: None   Collection Time: 12/31/17  6:53 PM  Result Value Ref Range Status   Specimen Description EXPECTORATED SPUTUM  Final   Special Requests   Final    NONE Performed at [facility], [address], [place], [place] [address]    Sputum evaluation   Final    Sputum specimen not acceptable for testing.  Please recollect.   Results Called to: J. THACKER, RN AT 2327 ON 12/31/17 BY C. JESSUP, MLT.    Report Status 12/31/2017 FINAL  Final  Fungus Culture With Stain     Status: None   Collection Time: 12/31/17  6:53 PM  Result Value Ref Range Status   Fungus Stain Final report  Final   Fungus (Mycology) Culture Preliminary report  Final    Comment: (NOTE) Performed At: [facility] [address] [place], [place] [id number] Rush Farmer MD MA:[id number]    Fungal Source SPUTUM  Final    Comment: Performed at [facility], [place] [address], [place], [place] [address]  Acid Fast Smear (AFB)     Status: None   Collection Time: 12/31/17  6:53 PM  Result Value Ref Range Status   AFB Specimen Processing Concentration  Final   Acid Fast Smear Negative  Final    Comment: (NOTE) Performed At: [facility] [place], [place] [id number] Rush Farmer MD [id number]    Source (AFB) SPUTUM  Final    Comment:  Performed at [facility], [place] [address] [place], [place] [address]  Fungus Culture Result     Status: None   Collection Time: 12/31/17  6:53 PM  Result Value Ref Range Status   Result 1 Yeast observed  Final    Comment: (NOTE) Performed At: [facility] [place], [place] [id number] Rush Farmer MD HO:[id number] Performed at [facility], [place] [address] [place], [place] [address]   Fungal organism reflex     Status: None   Collection Time: 12/31/17  6:53 PM  Result Value Ref Range Status   Fungal result 1 Candida albicans  Final    Comment: (NOTE) Moderate growth Performed At: [facility] [address] [place], [place] [id number] Rush Farmer MD UU:[id number] Performed at [facility], [place] [address], [place], [place] [address]   Blood Culture (routine x 2)  Status: Abnormal   Collection Time: 01/11/18 12:15 AM  Result Value Ref Range Status   Specimen Description BLOOD LEFT ANTECUBITAL  Final   Special Requests   Final    BOTTLES DRAWN AEROBIC AND ANAEROBIC Blood Culture adequate volume   Culture  Setup Time   Final    GRAM POSITIVE COCCI IN BOTH AEROBIC AND ANAEROBIC BOTTLES CRITICAL RESULT CALLED TO, READ BACK BY AND VERIFIED WITH: ESINCLAIR,PHARMD '@1900'  01/11/18 BY LHOWARD    Culture (A)  Final    STAPHYLOCOCCUS EPIDERMIDIS STAPHYLOCOCCUS SIMULANS THE SIGNIFICANCE OF ISOLATING THIS ORGANISM FROM A SINGLE SET OF BLOOD CULTURES WHEN MULTIPLE SETS ARE DRAWN IS UNCERTAIN. PLEASE NOTIFY THE MICROBIOLOGY DEPARTMENT WITHIN ONE WEEK IF SPECIATION AND SENSITIVITIES ARE REQUIRED. Performed at [facility], [place] [address], [place], [place] [address]    Report Status 01/16/2018 FINAL  Final  Blood Culture ID Panel (Reflexed)     Status: Abnormal   Collection Time: 01/11/18 12:15 AM  Result Value Ref Range Status   Enterococcus species NOT DETECTED NOT DETECTED Final   Listeria monocytogenes NOT DETECTED NOT  DETECTED Final   Staphylococcus species DETECTED (A) NOT DETECTED Final    Comment: Methicillin (oxacillin) resistant coagulase negative staphylococcus. Possible blood culture contaminant (unless isolated from more than one blood culture draw or clinical case suggests pathogenicity). No antibiotic treatment is indicated for blood  culture contaminants. CRITICAL RESULT CALLED TO, READ BACK BY AND VERIFIED WITH: ESINCLAIR,PHARMD '@1900'  01/11/18 BY LHOWARD    Staphylococcus aureus NOT DETECTED NOT DETECTED Final   Methicillin resistance DETECTED (A) NOT DETECTED Final    Comment: CRITICAL RESULT CALLED TO, READ BACK BY AND VERIFIED WITH: ESINCLAIR,PHARMD '@1900'  01/11/18 BY LHOWARD    Streptococcus species NOT DETECTED NOT DETECTED Final   Streptococcus agalactiae NOT DETECTED NOT DETECTED Final   Streptococcus pneumoniae NOT DETECTED NOT DETECTED Final   Streptococcus pyogenes NOT DETECTED NOT DETECTED Final   Acinetobacter baumannii NOT DETECTED NOT DETECTED Final   Enterobacteriaceae species NOT DETECTED NOT DETECTED Final   Enterobacter cloacae complex NOT DETECTED NOT DETECTED Final   Escherichia coli NOT DETECTED NOT DETECTED Final   Klebsiella oxytoca NOT DETECTED NOT DETECTED Final   Klebsiella pneumoniae NOT DETECTED NOT DETECTED Final   Proteus species NOT DETECTED NOT DETECTED Final   Serratia marcescens NOT DETECTED NOT DETECTED Final   Haemophilus influenzae NOT DETECTED NOT DETECTED Final   Neisseria meningitidis NOT DETECTED NOT DETECTED Final   Pseudomonas aeruginosa NOT DETECTED NOT DETECTED Final   Candida albicans NOT DETECTED NOT DETECTED Final   Candida glabrata NOT DETECTED NOT DETECTED Final   Candida krusei NOT DETECTED NOT DETECTED Final   Candida parapsilosis NOT DETECTED NOT DETECTED Final   Candida tropicalis NOT DETECTED NOT DETECTED Final    Comment: Performed at [facility], [place]. [address] [place], [place] [address]  Blood Culture (routine x 2)      Status: Abnormal   Collection Time: 01/11/18 12:20 AM  Result Value Ref Range Status   Specimen Description BLOOD RIGHT FOREARM  Final   Special Requests   Final    BOTTLES DRAWN AEROBIC AND ANAEROBIC Blood Culture adequate volume   Culture  Setup Time   Final    GRAM POSITIVE RODS ANAEROBIC BOTTLE ONLY CRITICAL RESULT CALLED TO, READ BACK BY AND VERIFIED WITH: E SINCLAIR,PHARMD AT 0240 01/11/18 BY L BENFIELD GRAM POSITIVE COCCI AEROBIC BOTTLE ONLY CRITICAL VALUE NOTED.  VALUE IS CONSISTENT WITH PREVIOUSLY REPORTED AND CALLED  VALUE. Performed at [facility], [place] [address], [place], [place] [address]    Culture (A)  Final    ENTEROCOCCUS FAECALIS STAPHYLOCOCCUS HAEMOLYTICUS THE SIGNIFICANCE OF ISOLATING THIS ORGANISM FROM A SINGLE SET OF BLOOD CULTURES WHEN MULTIPLE SETS ARE DRAWN IS UNCERTAIN. PLEASE NOTIFY THE MICROBIOLOGY DEPARTMENT WITHIN ONE WEEK IF SPECIATION AND SENSITIVITIES ARE REQUIRED. CLOSTRIDIUM PERFRINGENS    Report Status 01/15/2018 FINAL  Final   Organism ID, Bacteria ENTEROCOCCUS FAECALIS  Final      Susceptibility   Enterococcus faecalis - MIC*    AMPICILLIN <=2 SENSITIVE Sensitive     VANCOMYCIN 2 SENSITIVE Sensitive     GENTAMICIN SYNERGY SENSITIVE Sensitive     * ENTEROCOCCUS FAECALIS  Blood Culture ID Panel (Reflexed)     Status: None   Collection Time: 01/11/18 12:20 AM  Result Value Ref Range Status   Enterococcus species NOT DETECTED NOT DETECTED Final   Listeria monocytogenes NOT DETECTED NOT DETECTED Final   Staphylococcus species NOT DETECTED NOT DETECTED Final   Staphylococcus aureus NOT DETECTED NOT DETECTED Final   Streptococcus species NOT DETECTED NOT DETECTED Final   Streptococcus agalactiae NOT DETECTED NOT DETECTED Final   Streptococcus pneumoniae NOT DETECTED NOT DETECTED Final   Streptococcus pyogenes NOT DETECTED NOT DETECTED Final   Acinetobacter baumannii NOT DETECTED NOT DETECTED Final   Enterobacteriaceae species NOT DETECTED  NOT DETECTED Final   Enterobacter cloacae complex NOT DETECTED NOT DETECTED Final   Escherichia coli NOT DETECTED NOT DETECTED Final   Klebsiella oxytoca NOT DETECTED NOT DETECTED Final   Klebsiella pneumoniae NOT DETECTED NOT DETECTED Final   Proteus species NOT DETECTED NOT DETECTED Final   Serratia marcescens NOT DETECTED NOT DETECTED Final   Haemophilus influenzae NOT DETECTED NOT DETECTED Final   Neisseria meningitidis NOT DETECTED NOT DETECTED Final   Pseudomonas aeruginosa NOT DETECTED NOT DETECTED Final   Candida albicans NOT DETECTED NOT DETECTED Final   Candida glabrata NOT DETECTED NOT DETECTED Final   Candida krusei NOT DETECTED NOT DETECTED Final   Candida parapsilosis NOT DETECTED NOT DETECTED Final   Candida tropicalis NOT DETECTED NOT DETECTED Final    Comment: Performed at [facility], [address], [place], [place] [address]  Urine culture     Status: None   Collection Time: 01/11/18  2:28 AM  Result Value Ref Range Status   Specimen Description URINE, CLEAN CATCH  Final   Special Requests NONE  Final   Culture   Final    NO GROWTH Performed at [facility], [place] [address], [place], [place] [address]    Report Status 01/12/2018 FINAL  Final  Culture, blood (routine x 2)     Status: None   Collection Time: 01/12/18  6:40 AM  Result Value Ref Range Status   Specimen Description BLOOD LEFT HAND  Final   Special Requests   Final    BOTTLES DRAWN AEROBIC ONLY Blood Culture adequate volume   Culture   Final    NO GROWTH 5 DAYS Performed at [facility], [place] [address] [place], [place] [address]    Report Status 01/17/2018 FINAL  Final  Culture, blood (routine x 2)     Status: None   Collection Time: 01/12/18  6:49 AM  Result Value Ref Range Status   Specimen Description BLOOD RIGHT HAND  Final   Special Requests   Final    BOTTLES DRAWN AEROBIC ONLY Blood Culture adequate volume   Culture  Final    NO GROWTH 5 DAYS Performed at [facility], [place] [address], [place], [place] [address]    Report Status 01/17/2018 FINAL  Final  Culture, blood (routine x 2)     Status: None (Preliminary result)   Collection Time: 01/13/18  2:28 PM  Result Value Ref Range Status   Specimen Description BLOOD LEFT HAND  Final   Special Requests   Final    BOTTLES DRAWN AEROBIC ONLY Blood Culture results may not be optimal due to an inadequate volume of blood received in culture bottles   Culture   Final    NO GROWTH 4 DAYS Performed at [facility], [place] [address] [place], [place] [address]    Report Status PENDING  Incomplete  Culture, blood (routine x 2)     Status: None (Preliminary result)   Collection Time: 01/13/18  2:32 PM  Result Value Ref Range Status   Specimen Description BLOOD RIGHT HAND  Final   Special Requests   Final    BOTTLES DRAWN AEROBIC AND ANAEROBIC Blood Culture adequate volume   Culture   Final    NO GROWTH 4 DAYS Performed at [facility], [place] [address], [place], [place] [address]    Report Status PENDING  Incomplete    Studies/Results: No results found.    Assessment/Plan:  INTERVAL HISTORY: pt seems to be improving. His ANC is already on its way down again    Principal Problem:   Bacteremia due to Enterococcus Active Problems:   HCAP (healthcare-associated pneumonia)   Sepsis ([place])   Bacteremia due to clostridium perfringens    Joseph [name] is a 67 y.o. male with  *severe rheumatoid arthritis and emphysema who has had problems with neutropenia in the context of methotrexate his RA.  He is apparently had 8 hospitalizations for sepsis in the last year.  He is taken care of by a rheumatologist and hematologist at [facility].  He is now been admitted with a polymicrobial bacteremia /- infected bullous area  #1 Polymicrobial bacteremia with AMP S Enterococcus, C perfringens found in 1 of 2 blood cultures he has a Staphylococcus hemolyticus in that same blood culture the  other blood culture has 2 different staphylococcal species which we believe are contaminants.  To be improving on IV Unasyn  He has some aortic regurgitation on transthoracic echocardiogram.  I would consider a transesophageal echocardiogram especially with his history of current sepsis  2.  Neutropenia I have added a differential to today's labs in tomorrow's labs his physicians at [facility] very much want to get a bone marrow biopsy and attempts of been made to transfer the patient there.  For now continue to support him with Granix and to communicate closely with the rheumatologist and hematologist at [facility]  I do not think that he should need neutropenic precautions if we are keeping neutrophils up but his wife and the patient are very worried about this and asking me to wear a mask and to ensure that there are no fresh fruits and vegetables that out of the room.  I have reviewed the phone notes from his 2 subspecialist at [facility].  I spent greater than35 minutes with the patient including greater than 50% of time in face to face counsel of the patient and his wife with regards to problems with neutropenia and recurrent infections and in coordination of his care.    LOS: 6 days   Roderic Scarce  Tommy Medal 01/17/2018, 11:41 AM

## 2018-01-17 NOTE — Progress Notes (Signed)
ANTICOAGULATION CONSULT NOTE - Initial Consult  Pharmacy Consult for Eliquis  Indication: atrial fibrillation  No Known Allergies  Patient Measurements: Height: 6\' 2"  (188 cm) Weight: 199 lb 11.8 oz (90.6 kg) IBW/kg (Calculated) : 82.2  Vital Signs: Temp: 97.8 F (36.6 C) (05/25 0924) Temp Source: Oral (05/25 0924) BP: 170/148 (05/25 0924) Pulse Rate: 144 (05/25 0924)  Labs: Recent Labs    01/15/18 0305 01/16/18 0616 01/17/18 0415  HGB 9.7* 10.8* 11.2*  HCT 30.2* 33.5* 35.4*  PLT 141* 168 211  CREATININE 0.47* 0.53* 0.54*    Estimated Creatinine Clearance: 107 mL/min (A) (by C-G formula based on SCr of 0.54 mg/dL (L)).   Medical History: Past Medical History:  Diagnosis Date  . CAD (coronary artery disease)    MI 2007, CABG  . Cataracts, bilateral   . Complication of anesthesia    DIFFICULTY AFTER CABG HAD CONFUSION POST OP  . COPD (chronic obstructive pulmonary disease) (HCC)   . Diabetes mellitus    Dx 07-2008 A1C 6.2  . Esophageal dysmotility   . Fatty liver 10/11/09  . Gastroesophageal reflux disease   . Gastroparesis   . Hiatal hernia   . Hypertension   . Neuropathy    (related to RA per neuro note 05-2011)  . Onychomycosis   . Osteoporosis    per DEXA 05-2008  . Oxygen dependent    3l  . Peripheral polyneuropathy    Severe, causing problems with his feet, see surgeries  . Psoriasis   . Pulmonary nodules   . Rheumatoid arthritis (HCC)    [facility]  . Septic shock (HCC) 07/2017    Assessment: 67 yo M with nonvalvular atrial fibrillation. Pharmacy consulted for apixaban. CBC low stable, no signs/sx bleeding.   Goal of Therapy:  Monitor platelets by anticoagulation protocol: Yes   Plan:  Eliquis 5 mg BID  77, PharmD PGY1 Acute Care Pharmacy Resident 01/17/2018,10:55 AM

## 2018-01-17 NOTE — Progress Notes (Signed)
Pt took his home DPI Trelegy 1 puff.

## 2018-01-17 NOTE — Progress Notes (Signed)
OT Cancellation Note  Patient Details Name: Joseph [name] MRN: [id number] DOB: [birth year]   Cancelled Treatment:    Reason Eval/Treat Not Completed: Medical issues which prohibited therapy. Last BP reading 170/148 and nurse asked OT to come back as pt is planning to get EKG.   Earlie Raveling OTR/L 01/17/2018, 9:48 AM

## 2018-01-17 NOTE — Discharge Summary (Signed)
PATIENT DETAILS Name: Joseph [name] Age: 67 y.o. Sex: male Date of Birth: Sep 20, 1950 MRN: [id number]. Admitting Physician: Reyne Dumas, MD JME:QASTMH, Early Chars, MD  Admit Date: 01/11/2018 Discharge date: 01/17/2018  Recommendations for Outpatient Follow-up:  1. Follow up with PCP in 1-2 weeks  Admitted From:  Home  Disposition: Roberts: No  Equipment/Devices: None  Discharge Condition: Stable  CODE STATUS: FULL CODE  Diet recommendation:  Heart Healthy / Carb Modified  Brief Summary: See H&P, Labs, Consult and Test reports for all details in brief,Patient is a 67 y.o. 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  Briefly managed in the intensive care unit-upon stability, transfered to the triad hospitalist service on 5/22.  Further hospital course has been complicated by development of A. fib with RVR. Awaiting transfer to [facility] when bed available   Brief Hospital Course: Septic shock secondary to polymicrobial bloodstream infection (enterococcus, CoNS and Clostridium perfringens) and left upper lobe pneumonia: Sepsis pathophysiology has resolved, he is clinically improved, infectious disease following with recommendations to complete 8 additional days of IV Unasyn from 8/24.   Neutropenia: Has had issues with recurrent neutropenia-as noted above, plans were to follow-up with his hematologist at [facility] for a bone marrow  biopsy.  Unfortunately has had numerous recent hospitalizations for infection/sepsis in the past 6 months.  Neutropenia has improved-patient received 1 dose of Granix on 5/21.  Unlikely to have Felty syndrome as he does not have any evidence of splenomegaly. Dr. Sherral Hammers discussed with patient's hematologist on 5/23-regarding timing of bone marrow biopsy.  He is awaiting bed at [facility] will be transferred for further work-up when a bed is available  A. fib with RVR: Occurred briefly while in the intensive care unit on 5/19-maintained sinus rhythm since then-only to revert back to A. fib with RVR on 5/25. Underwent ablation in 2018 by Dr. Lovena Le for atrial flutter.  Had spoken with EP service over the phone on 5/24-plans were to initiate anticoagulation and rate control agents if A. fib reoccurred-since it has-have started metoprolol and Eliquis.  Discussed case briefly with Dr. Debara Pickett over the phone on 5/25 as well.    Chronic hypoxic respiratory failure/COPD: Stable without any evidence of exacerbation.  CAD status post CABG 2007: no anginal symptoms-remains on aspirin  Chronic diastolic heart failure (EF 60-65% by TTE on 01/12/2018): Remains compensated  Rheumatoid arthritis: Continue prednisone daily.  Diarrhea: Has resolved-thought to have polymicrobial bloodstream infection due to possible bacterial translocation from diarrhea.  Recent C. difficile PCR on 5/4 was negative.  DM-2: CBGs remain stable with SSI.   Hyperlipidemia: Continue statin  Depression/insomnia: Appears stable-continue trazodone  Telemetry (independently reviewed): A. fib with RVR  Procedures/Studies: None  Discharge Diagnoses:  Principal Problem:   Bacteremia due to Enterococcus Active Problems:   HCAP (healthcare-associated pneumonia)   Sepsis ([place])   Bacteremia due to clostridium perfringens   Discharge Instructions:  Activity:  As tolerated with Full fall precautions use walker/cane &  assistance as needed   Discharge Instructions    Diet -  low sodium heart healthy   Complete by:  As directed    Increase activity slowly   Complete by:  As directed      Allergies as of 01/17/2018   No Known Allergies     Medication List    STOP taking these medications   furosemide 20 MG tablet Commonly known as:  LASIX     TAKE these medications   albuterol 108 (90 Base) MCG/ACT inhaler Commonly known as:  PROAIR HFA Inhale 2 puffs into the lungs every 4 (four) hours as needed for wheezing or shortness of breath.   Ampicillin-Sulbactam 3 g in sodium chloride 0.9 % 100 mL Inject 3 g into the vein every 6 (six) hours.   apixaban 5 MG Tabs tablet Commonly known as:  ELIQUIS Take 1 tablet (5 mg total) by mouth 2 (two) times daily.   aspirin EC 81 MG tablet Take 81 mg by mouth daily.   atorvastatin 80 MG tablet Commonly known as:  LIPITOR TAKE 1 TABLET (52m) BY MOUTH DAILY What changed:    how much to take  how to take this  when to take this  additional instructions   clobetasol cream 0.05 % Commonly known as:  TEMOVATE Apply 1 application topically 2 (two) times daily as needed (rash).   dextromethorphan-guaiFENesin 30-600 MG 12hr tablet Commonly known as:  MUCINEX DM Take 1 tablet by mouth at bedtime.   folic acid 8706MCG tablet Commonly known as:  FOLVITE Take 800 mcg by mouth daily.   hydrocortisone cream 1 % Apply 1 application topically 3 (three) times daily as needed for itching.   ipratropium-albuterol 0.5-2.5 (3) MG/3ML Soln Commonly known as:  DUONEB Take 3 mLs by nebulization every 4 (four) hours as needed (shortness of breath or wheezing).   LOTEMAX 0.5 % Gel Generic drug:  Loteprednol Etabonate Place 1 drop into both eyes daily.   Melatonin 1 MG Caps Take 9 mg by mouth at bedtime.   metoprolol tartrate 50 MG tablet Commonly known as:  LOPRESSOR Take 1 tablet (50 mg total) by mouth 2 (two) times daily.   metoprolol tartrate 5  MG/5ML Soln injection Commonly known as:  LOPRESSOR Inject 5 mLs (5 mg total) into the vein every 6 (six) hours as needed (if heart rate >120).   nitroGLYCERIN 0.4 MG SL tablet Commonly known as:  NITROSTAT Place 1 tablet (0.4 mg total) under the tongue every 5 (five) minutes as needed for chest pain (up to 3 doses for severe chest pain. If no relief after 3rd dose, proceed to the ED for an evaluation).   OVER THE COUNTER MEDICATION Apply topically as needed. T-Gel Shampoo   oxyCODONE-acetaminophen 7.5-325 MG tablet Commonly known as:  PERCOCET Take 1 tablet by mouth 5 (five) times daily.   OXYGEN Inhale 3 L into the lungs continuous.   pantoprazole 40 MG tablet Commonly known as:  PROTONIX Take 1 tablet (40 mg total) by mouth 2 (two) times daily. What changed:  additional instructions   PERFECT IRON PO Take 50 mg by mouth daily.   potassium chloride 10 MEQ tablet Commonly known as:  K-DUR,KLOR-CON Take 20-40 mEq by mouth See admin instructions. Take 4 tablets every morning and take 2 tablets at 1600   predniSONE 20 MG tablet Commonly known as:  DELTASONE Take 15 mg by mouth daily with breakfast.   pregabalin 75 MG capsule Commonly known as:  LYRICA Take 75 mg by mouth 2 (two) times daily. Take second dose at  1600   sodium chloride 0.65 % Soln nasal spray Commonly known as:  OCEAN Place 1 spray into both nostrils as needed for congestion.   SUPER B COMPLEX PO Take 1 tablet by mouth daily.   TRELEGY ELLIPTA 100-62.5-25 MCG/INH Aepb Generic drug:  Fluticasone-Umeclidin-Vilant Inhale 1 puff into the lungs daily.   vitamin C 500 MG tablet Commonly known as:  ASCORBIC ACID Take 500 mg by mouth daily.   VITEYES AREDS ADVANCED PO Take 1 tablet by mouth daily.      Follow-up Information    Minus Breeding, MD Follow up on 02/04/2018.   Specialty:  Cardiology Why:  at 10:20AM Contact information: [address] [place] [address] [phone]         Sharyne Richters, MD. Schedule an appointment as soon as possible for a visit in 1 week(s).   Specialty:  Internal Medicine Contact information: [address] [place] [address] [phone]          No Known Allergies  Consultations:   pulmonary/intensive care and ID  Other Procedures/Studies: Dg Chest 2 View  Result Date: 01/02/2018 CLINICAL DATA:  Dyspnea. Re-evaluate left upper lobe cavitary lesion. EXAM: CHEST - 2 VIEW COMPARISON:  12/27/2017. FINDINGS: Stable enlarged cardiac silhouette and post CABG changes. The previously seen 7.6 cm left upper lobe cavitary lesion again measures 7.6 cm in maximum diameter. The this has interval increased density. Adjacent patchy opacity is unchanged. Right upper lobe bullous changes again demonstrated. The interstitial markings remain prominent. Diffuse osteopenia. IMPRESSION: 1. Interval fluid accumulation within the previously demonstrated left upper lobe cavitary lesion. 2. Stable mild adjacent patchy pneumonia or atelectasis. 3. Stable cardiomegaly, chronic interstitial lung disease and right upper lobe bullous changes. Electronically Signed   By: Claudie Revering M.D.   On: 01/02/2018 12:55   Dg Chest 2 View  Result Date: 12/22/2017 CLINICAL DATA:  Rheumatoid flare up with fever for 2 days. Ex-smoker. EXAM: CHEST - 2 VIEW COMPARISON:  Radiographs 10/27/2017 and 11/08/2017.  CT 08/14/2017. FINDINGS: The heart size and mediastinal contours are stable status post median sternotomy and CABG. There is aortic atherosclerosis. Diffuse central airway thickening is present. There are multiple upper lobe bulla. Dominant left upper lobe lesion demonstrates interval increased wall thickening which may be secondary to superimposed infection or hemorrhage. There is no focal airspace disease, pleural effusion or pneumothorax. No acute osseous findings are evident. IMPRESSION: Interval increased wall thickening of dominant left upper lobe  bulla, possibly due to super infection or hemorrhage. Diffuse central airway thickening. No focal airspace disease. Electronically Signed   By: Richardean Sale M.D.   On: 12/22/2017 10:50   Ct Chest W Contrast  Result Date: 01/11/2018 CLINICAL DATA:  Acute onset of generalized weakness and fever. Decreased oral intake. EXAM: CT CHEST WITH CONTRAST TECHNIQUE: Multidetector CT imaging of the chest was performed during intravenous contrast administration. CONTRAST:  153m OMNIPAQUE IOHEXOL 300 MG/ML  SOLN COMPARISON:  Chest radiograph performed earlier today at 12:22 a.m., and CT of the chest performed 12/22/2017 FINDINGS: Cardiovascular: The heart is normal in size. Diffuse coronary artery calcifications are seen. Scattered calcification noted along the thoracic aorta and proximal great vessels. The descending thoracic aorta is borderline normal in caliber. Mediastinum/Nodes: The patient is status post median sternotomy. Visualized mediastinal nodes remain borderline normal in size. No pericardial effusion is identified. The thyroid gland is unremarkable. No axillary lymphadenopathy is seen. Lungs/Pleura: Multiple cavitations are noted at the left lung apex, with  new air-fluid levels, concerning for atypical infection superimposed on the patient's chronic bullous disease. Peripheral honeycombing is noted bilaterally. Mild interstitial prominence may reflect mild interstitial edema. No definite pleural effusion or pneumothorax is seen. Upper Abdomen: The visualized portions of the liver and spleen are unremarkable. The patient is status post cholecystectomy, with a clip noted at the gallbladder fossa. The visualized portions of the spleen are unremarkable. A tiny hiatal hernia is noted. Musculoskeletal: No acute osseous abnormalities are identified. The visualized musculature is unremarkable in appearance. IMPRESSION: 1. Multiple cavitations noted at the left lung apex, with new air-fluid levels, concerning for  atypical infection superimposed on chronic bullous disease. 2. Mild interstitial prominence may reflect mild interstitial edema. 3. Diffuse coronary artery calcifications seen. 4. Tiny hiatal hernia noted. Electronically Signed   By: Garald Balding M.D.   On: 01/11/2018 04:23   Ct Chest W Contrast  Result Date: 12/22/2017 CLINICAL DATA:  Fever EXAM: CT CHEST WITH CONTRAST TECHNIQUE: Multidetector CT imaging of the chest was performed during intravenous contrast administration. CONTRAST:  51m ISOVUE-300 IOPAMIDOL (ISOVUE-300) INJECTION 61% COMPARISON:  Chest radiograph December 22, 2016; chest CT August 14, 2017 FINDINGS: Cardiovascular: There is no thoracic aortic aneurysm or dissection. There is calcification at the origins of the left common carotid and left subclavian arteries. Mild scattered calcification is noted elsewhere the great vessels. There is aortic atherosclerosis. There are foci of native coronary artery calcification. The patient is status post coronary artery bypass grafting. There is no major vessel pulmonary embolus. No pericardial effusion or pericardial thickening is evident. Mediastinum/Nodes: Thyroid appears normal. There are multiple subcentimeter mediastinal lymph nodes. There is a subcarinal lymph node measuring 1.3 x 1.0 cm. There is a second sub- carinal lymph node measuring 1.2 x 1.0 cm. There is a small hiatal hernia. Lungs/Pleura: There is underlying bullous emphysematous change. Extensive bullous disease is noted in both upper lobes. In comparison with the previous study, there is a bulla in the posterior segment left upper lobe measuring 7.7 x 4.6 cm. The wall of this bulla has become considerably thicker compared to most recent study. There is also thickening along the nearby major fissure with localized consolidation adjacent to the major fissure in the posterior segment of the left upper lobe. There is no other area of consolidation. There are areas of scarring and atelectatic  change in the lung bases. There are scattered areas of fibrosis which appear stable. There is a nodular opacity along the right major fissure measuring 9 x 5 mm, stable and likely representing an intrafissural lymph node. Upper Abdomen: Gallbladder is absent. Spleen is incompletely visualized. Spleen measures 13.8 cm from anterior to posterior dimension which is prominent. There is upper abdominal aortic atherosclerosis. Musculoskeletal: No blastic or lytic bone lesions are evident. Patient is status post median sternotomy. No chest wall lesions are evident. IMPRESSION: 1. 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 These changes are similar compared to prior study. Note, however, that the largest bulla, located in the posterior segment left upper lobe, shows considerable wall thickening, a change from the previous study. There is a nearby area of airspace consolidation in the posterior segment of the left upper lobe adjacent to the major fissure, felt to represent pneumonia. The thickening of the wall of the bulla is most likely due to infection of this bulla. Atypical cystic neoplasm may present in this manner and cannot be entirely excluded at this time. Note that there is no evidence of mycetoma  in this bulla or elsewhere. Given this circumstance, would advise repeat CT after treatment for pneumonia to assess for stability or possible resolution of wall thickening in this bulla. Given the possibility of underlying neoplasm, close clinical and imaging surveillance advised in this regard. 2. Mildly prominent sub-carinal lymph nodes of uncertain etiology. These lymph nodes could well be reactive given the parenchymal lung changes. 3. Status post coronary artery bypass grafting. There is aortic and great vessel atherosclerotic calcification as well as calcification in native coronary arteries. 4.  Small hiatal hernia. 5.  Spleen appears enlarged although incompletely  visualized. 6.  Gallbladder surgically absent. Aortic Atherosclerosis (ICD10-I70.0) and Emphysema (ICD10-J43.9). Electronically Signed   By: Lowella Grip III M.D.   On: 12/22/2017 13:17   Ct Abdomen Pelvis W Contrast  Result Date: 01/13/2018 CLINICAL DATA:  Painless diarrhea EXAM: CT ABDOMEN AND PELVIS WITH CONTRAST TECHNIQUE: Multidetector CT imaging of the abdomen and pelvis was performed using the standard protocol following bolus administration of intravenous contrast. CONTRAST:  123m OMNIPAQUE IOHEXOL 300 MG/ML  SOLN COMPARISON:  08/14/2017 FINDINGS: LOWER CHEST: Small left pleural effusion. Emphysema and bibasilar atelectasis. HEPATOBILIARY: Normal hepatic contours and density. No intra- or extrahepatic biliary dilatation. Status post cholecystectomy. PANCREAS: Normal parenchymal contours without ductal dilatation. No peripancreatic fluid collection. SPLEEN: Normal. ADRENALS/URINARY TRACT: --Adrenal glands: Normal. --Right kidney/ureter: Multiple nonobstructing renal stones measuring up 2 7 mm. No hydronephrosis. No ureterolithiasis. --Left kidney/ureter: Tiny non-obstructing 1-2 mm stone at the lower pole. No hydronephrosis. --Urinary bladder: Normal for degree of distention STOMACH/BOWEL: --Stomach/Duodenum: No hiatal hernia or other gastric abnormality. Normal duodenal course. --Small bowel: No dilatation or inflammation. --Colon: There is a fecal management system in the rectum. There is sigmoid diverticulosis without acute inflammation. The remainder of the colon is normal. --Appendix: Normal. VASCULAR/LYMPHATIC: Atherosclerotic calcification is present within the non-aneurysmal abdominal aorta, without hemodynamically significant stenosis. The portal vein, splenic vein, superior mesenteric vein and IVC are patent. No abdominal or pelvic lymphadenopathy. REPRODUCTIVE: Normal prostate and seminal vesicles. MUSCULOSKELETAL. No bony spinal canal stenosis or focal osseous abnormality. OTHER: None.  IMPRESSION: 1. No acute abnormality of the abdomen or pelvis. 2. Small left pleural effusion. 3. Bilateral nonobstructive nephrolithiasis. 4. Aortic Atherosclerosis (ICD10-I70.0) and Emphysema (ICD10-J43.9). Electronically Signed   By: KUlyses JarredM.D.   On: 01/13/2018 17:57   Dg Chest Port 1 View  Result Date: 01/11/2018 CLINICAL DATA:  Sepsis, fever and weakness.  Dyspnea x1 day. EXAM: PORTABLE CHEST 1 VIEW COMPARISON:  Chest CT 12/22/2017, CXR 01/02/2018 FINDINGS: Stable cardiomegaly with post CABG change and aortic atherosclerosis. Redemonstration of left apical bullae, the largest is partially filled with fluid and thick-walled compatible with an infected bulla. This is slightly smaller in appearance than on prior more cavitary in appearance than recent comparison chest radiograph. Subsegmental scarring is seen in the right upper lobe and mid lung. Probable trace right effusion. No acute osseous abnormality. IMPRESSION: 1. Cardiomegaly with aortic atherosclerosis and post CABG change. 2. Thick-walled left upper lobe bulla compatible with an infected bulla is redemonstrated though slightly more cavitary in appearance and less opacified than on prior comparison. This would suggest some partial clearing of fluid within. Electronically Signed   By: DAshley RoyaltyM.D.   On: 01/11/2018 00:46   Dg Chest Port 1 View  Result Date: 12/27/2017 CLINICAL DATA:  Sepsis. EXAM: PORTABLE CHEST 1 VIEW COMPARISON:  Chest radiograph 12/27/2017, chest CT 12/22/2017 FINDINGS: Postsurgical changes from CABG. Cardiomediastinal silhouette is normal. Mediastinal contours appear intact. Emphysema, chronic  interstitial lung changes. Previously noted thick-walled cystic structure in the left upper lobe has increased in size no measuring 7.6 cm, with increased surrounding airspace consolidation. Osseous structures are without acute abnormality. Soft tissues are grossly normal. IMPRESSION: Known left upper lobe thick-walled cavitary  lesion has increased in size with increased surrounding airspace consolidation. Emphysema with chronic interstitial lung changes. Electronically Signed   By: Fidela Salisbury M.D.   On: 12/27/2017 17:34   Dg Chest Port 1 View  Result Date: 12/24/2017 CLINICAL DATA:  Pneumonia. EXAM: PORTABLE CHEST 1 VIEW COMPARISON:  CT scan and radiographs of December 22, 2017. FINDINGS: Stable cardiomediastinal silhouette. Status post coronary artery bypass graft. No pneumothorax is noted. Stable scarring is seen throughout both lungs, most prominently seen in right upper lobe. Left upper lobe bulla is again noted with significant wall thickening consistent with infection and surrounding pneumonia. Bony thorax is unremarkable. IMPRESSION: Left upper lobe bulla is again noted with significant wall thickening consistent with infection and surrounding pneumonia as described on prior CT scan. Electronically Signed   By: Marijo Conception, M.D.   On: 12/24/2017 10:00   Dg Hips Bilat With Pelvis 2v  Result Date: 01/07/2018 CLINICAL DATA:  Pain in both hips for a week EXAM: DG HIP (WITH OR WITHOUT PELVIS) 2V BILAT COMPARISON:  CT 08/14/2017 FINDINGS: Mild SI joint degenerative changes. Pubic symphysis and rami are intact. 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 No fracture or malalignment. Vascular calcifications. IMPRESSION: Mild arthritis of the hips.  No acute osseous abnormality Electronically Signed   By: Donavan Foil M.D.   On: 01/07/2018 20:25      TODAY-DAY OF DISCHARGE:  Subjective:   Early [place] today has no headache,no chest abdominal pain,no new weakness tingling or numbness, feels much better wants to go home today.   Objective:   Blood pressure (!) 170/148, pulse (!) 144, temperature 97.8 F (36.6 C), temperature source Oral, resp. rate 18, height '6\' 2"'  (1.88 m), weight 90.6 kg (199 lb 11.8 oz), SpO2 100 %.  Intake/Output Summary (Last 24 hours) at 01/17/2018 1745 Last data  filed at 01/17/2018 1500 Gross per 24 hour  Intake 780 ml  Output 1865 ml  Net -1085 ml   Filed Weights   01/14/18 0500 01/16/18 0602 01/16/18 2055  Weight: 97.5 kg (214 lb 15.2 oz) 92.9 kg (204 lb 12.9 oz) 90.6 kg (199 lb 11.8 oz)    Exam: Awake Alert, Oriented *3, No new F.N deficits, Normal affect [place].AT,PERRAL Supple Neck,No JVD, No cervical lymphadenopathy appriciated.  Symmetrical Chest wall movement, Good air movement bilaterally, CTAB RRR,No Gallops,Rubs or new Murmurs, No Parasternal Heave +ve B.Sounds, Abd Soft, Non tender, No organomegaly appriciated, No rebound -guarding or rigidity. No Cyanosis, Clubbing or edema, No new Rash or bruise   PERTINENT RADIOLOGIC STUDIES: Dg Chest 2 View  Result Date: 01/02/2018 CLINICAL DATA:  Dyspnea. Re-evaluate left upper lobe cavitary lesion. EXAM: CHEST - 2 VIEW COMPARISON:  12/27/2017. FINDINGS: Stable enlarged cardiac silhouette and post CABG changes. The previously seen 7.6 cm left upper lobe cavitary lesion again measures 7.6 cm in maximum diameter. The this has interval increased density. Adjacent patchy opacity is unchanged. Right upper lobe bullous changes again demonstrated. The interstitial markings remain prominent. Diffuse osteopenia. IMPRESSION: 1. Interval fluid accumulation within the previously demonstrated left upper lobe cavitary lesion. 2. Stable mild adjacent patchy pneumonia or atelectasis. 3. Stable cardiomegaly, chronic interstitial lung disease and right upper lobe bullous changes. Electronically Signed  By: Claudie Revering M.D.   On: 01/02/2018 12:55   Dg Chest 2 View  Result Date: 12/22/2017 CLINICAL DATA:  Rheumatoid flare up with fever for 2 days. Ex-smoker. EXAM: CHEST - 2 VIEW COMPARISON:  Radiographs 10/27/2017 and 11/08/2017.  CT 08/14/2017. FINDINGS: The heart size and mediastinal contours are stable status post median sternotomy and CABG. There is aortic atherosclerosis. Diffuse central airway thickening is  present. There are multiple upper lobe bulla. Dominant left upper lobe lesion demonstrates interval increased wall thickening which may be secondary to superimposed infection or hemorrhage. There is no focal airspace disease, pleural effusion or pneumothorax. No acute osseous findings are evident. IMPRESSION: Interval increased wall thickening of dominant left upper lobe bulla, possibly due to super infection or hemorrhage. Diffuse central airway thickening. No focal airspace disease. Electronically Signed   By: Richardean Sale M.D.   On: 12/22/2017 10:50   Ct Chest W Contrast  Result Date: 01/11/2018 CLINICAL DATA:  Acute onset of generalized weakness and fever. Decreased oral intake. EXAM: CT CHEST WITH CONTRAST TECHNIQUE: Multidetector CT imaging of the chest was performed during intravenous contrast administration. CONTRAST:  170m OMNIPAQUE IOHEXOL 300 MG/ML  SOLN COMPARISON:  Chest radiograph performed earlier today at 12:22 a.m., and CT of the chest performed 12/22/2017 FINDINGS: Cardiovascular: The heart is normal in size. Diffuse coronary artery calcifications are seen. Scattered calcification noted along the thoracic aorta and proximal great vessels. The descending thoracic aorta is borderline normal in caliber. Mediastinum/Nodes: The patient is status post median sternotomy. Visualized mediastinal nodes remain borderline normal in size. No pericardial effusion is identified. The thyroid gland is unremarkable. No axillary lymphadenopathy is seen. 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 Peripheral honeycombing is noted bilaterally. Mild interstitial prominence may reflect mild interstitial edema. No definite pleural effusion or pneumothorax is seen. Upper Abdomen: The visualized portions of the liver and spleen are unremarkable. The patient is status post  cholecystectomy, with a clip noted at the gallbladder fossa. The visualized portions of the spleen are unremarkable. A tiny hiatal hernia is noted. Musculoskeletal: No acute osseous abnormalities are identified. The visualized musculature is unremarkable in appearance. IMPRESSION: 1. Multiple cavitations noted at the left lung apex, with new air-fluid levels, concerning for atypical infection superimposed on chronic bullous disease. 2. Mild interstitial prominence may reflect mild interstitial edema. 3. Diffuse coronary artery calcifications seen. 4. Tiny hiatal hernia noted. Electronically Signed   By: JGarald BaldingM.D.   On: 01/11/2018 04:23   Ct Chest W Contrast  Result Date: 12/22/2017 CLINICAL DATA:  Fever EXAM: CT CHEST WITH CONTRAST TECHNIQUE: Multidetector CT imaging of the chest was performed during intravenous contrast administration. CONTRAST:  852mISOVUE-300 IOPAMIDOL (ISOVUE-300) INJECTION 61% COMPARISON:  Chest radiograph December 22, 2016; chest CT August 14, 2017 FINDINGS: Cardiovascular: There is no thoracic aortic aneurysm or dissection. There is calcification at the origins of the left common carotid and left subclavian arteries. Mild scattered calcification is noted elsewhere the great vessels. There is aortic atherosclerosis. There are foci of native coronary artery calcification. The patient is status post coronary artery bypass grafting. There is no major vessel pulmonary embolus. No pericardial effusion or pericardial thickening is evident. Mediastinum/Nodes: Thyroid appears normal. There are multiple subcentimeter mediastinal lymph nodes. There is a subcarinal lymph node measuring 1.3 x 1.0 cm. There is a second sub- carinal lymph node measuring 1.2 x 1.0 cm.  There is a small hiatal hernia. Lungs/Pleura: There is underlying bullous emphysematous change. Extensive bullous disease is noted in both upper lobes. In comparison with the previous study, there is a bulla in the posterior  segment left upper lobe measuring 7.7 x 4.6 cm. The wall of this bulla has become considerably thicker compared to most recent study. There is also thickening along the nearby major fissure with localized consolidation adjacent to the major fissure in the posterior segment of the left upper lobe. There is no other area of consolidation. There are areas of scarring and atelectatic change in the lung bases. There are scattered areas of fibrosis which appear stable. There is a nodular opacity along the right major fissure measuring 9 x 5 mm, stable and likely representing an intrafissural lymph node. Upper Abdomen: Gallbladder is absent. Spleen is incompletely visualized. Spleen measures 13.8 cm from anterior to posterior dimension which is prominent. There is upper abdominal aortic atherosclerosis. Musculoskeletal: No blastic or lytic bone lesions are evident. Patient is status post median sternotomy. No chest wall lesions are evident. IMPRESSION: 1. 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 These changes are similar compared to prior study. Note, however, that the largest bulla, located in the posterior segment left upper lobe, shows considerable wall thickening, a change from the previous study. There is a nearby area of airspace consolidation in the posterior segment of the left upper lobe adjacent to the major fissure, felt to represent pneumonia. The thickening of the wall of the bulla is most likely due to infection of this bulla. Atypical cystic neoplasm may present in this manner and cannot be entirely excluded at this time. Note that there is no evidence of mycetoma in this bulla or elsewhere. Given this circumstance, would advise repeat CT after treatment for pneumonia to assess for stability or possible resolution of wall thickening in this bulla. Given the possibility of underlying neoplasm, close clinical and imaging surveillance advised in this regard. 2.  Mildly prominent sub-carinal lymph nodes of uncertain etiology. These lymph nodes could well be reactive given the parenchymal lung changes. 3. Status post coronary artery bypass grafting. There is aortic and great vessel atherosclerotic calcification as well as calcification in native coronary arteries. 4.  Small hiatal hernia. 5.  Spleen appears enlarged although incompletely visualized. 6.  Gallbladder surgically absent. Aortic Atherosclerosis (ICD10-I70.0) and Emphysema (ICD10-J43.9). Electronically Signed   By: Lowella Grip III M.D.   On: 12/22/2017 13:17   Ct Abdomen Pelvis W Contrast  Result Date: 01/13/2018 CLINICAL DATA:  Painless diarrhea EXAM: CT ABDOMEN AND PELVIS WITH CONTRAST TECHNIQUE: Multidetector CT imaging of the abdomen and pelvis was performed using the standard protocol following bolus administration of intravenous contrast. CONTRAST:  164m OMNIPAQUE IOHEXOL 300 MG/ML  SOLN COMPARISON:  08/14/2017 FINDINGS: LOWER CHEST: Small left pleural effusion. Emphysema and bibasilar atelectasis. HEPATOBILIARY: Normal hepatic contours and density. No intra- or extrahepatic biliary dilatation. Status post cholecystectomy. PANCREAS: Normal parenchymal contours without ductal dilatation. No peripancreatic fluid collection. SPLEEN: Normal. ADRENALS/URINARY TRACT: --Adrenal glands: Normal. --Right kidney/ureter: Multiple nonobstructing renal stones measuring up 2 7 mm. No hydronephrosis. No ureterolithiasis. --Left kidney/ureter: Tiny non-obstructing 1-2 mm stone at the lower pole. No hydronephrosis. --Urinary bladder: Normal for degree of distention STOMACH/BOWEL: --Stomach/Duodenum: No hiatal hernia or other gastric abnormality. Normal duodenal course. --Small bowel: No dilatation or inflammation. --Colon: There is a fecal management system in the rectum. There is sigmoid diverticulosis without acute inflammation. The remainder of the colon is  normal. --Appendix: Normal. VASCULAR/LYMPHATIC:  Atherosclerotic calcification is present within the non-aneurysmal abdominal aorta, without hemodynamically significant stenosis. The portal vein, splenic vein, superior mesenteric vein and IVC are patent. No abdominal or pelvic lymphadenopathy. REPRODUCTIVE: Normal prostate and seminal vesicles. MUSCULOSKELETAL. No bony spinal canal stenosis or focal osseous abnormality. OTHER: None. IMPRESSION: 1. No acute abnormality of the abdomen or pelvis. 2. Small left pleural effusion. 3. Bilateral nonobstructive nephrolithiasis. 4. Aortic Atherosclerosis (ICD10-I70.0) and Emphysema (ICD10-J43.9). Electronically Signed   By: Ulyses Jarred M.D.   On: 01/13/2018 17:57   Dg Chest Port 1 View  Result Date: 01/11/2018 CLINICAL DATA:  Sepsis, fever and weakness.  Dyspnea x1 day. EXAM: PORTABLE CHEST 1 VIEW COMPARISON:  Chest CT 12/22/2017, CXR 01/02/2018 FINDINGS: Stable cardiomegaly with post CABG change and aortic atherosclerosis. Redemonstration of left apical bullae, the largest is partially filled with fluid and thick-walled compatible with an infected bulla. This is slightly smaller in appearance than on prior more cavitary in appearance than recent comparison chest radiograph. Subsegmental scarring is seen in the right upper lobe and mid lung. Probable trace right effusion. No acute osseous abnormality. IMPRESSION: 1. Cardiomegaly with aortic atherosclerosis and post CABG change. 2. Thick-walled left upper lobe bulla compatible with an infected bulla is redemonstrated though slightly more cavitary in appearance and less opacified than on prior comparison. This would suggest some partial clearing of fluid within. Electronically Signed   By: Ashley Royalty M.D.   On: 01/11/2018 00:46   Dg Chest Port 1 View  Result Date: 12/27/2017 CLINICAL DATA:  Sepsis. EXAM: PORTABLE CHEST 1 VIEW COMPARISON:  Chest radiograph 12/27/2017, chest CT 12/22/2017 FINDINGS: Postsurgical changes from CABG. Cardiomediastinal silhouette is  normal. Mediastinal contours appear intact. Emphysema, chronic interstitial lung changes. Previously noted thick-walled cystic structure in the left upper lobe has increased in size no measuring 7.6 cm, with increased surrounding airspace consolidation. Osseous structures are without acute abnormality. Soft tissues are grossly normal. IMPRESSION: Known left upper lobe thick-walled cavitary lesion has increased in size with increased surrounding airspace consolidation. Emphysema with chronic interstitial lung changes. Electronically Signed   By: Fidela Salisbury M.D.   On: 12/27/2017 17:34   Dg Chest Port 1 View  Result Date: 12/24/2017 CLINICAL DATA:  Pneumonia. EXAM: PORTABLE CHEST 1 VIEW COMPARISON:  CT scan and radiographs of December 22, 2017. FINDINGS: Stable cardiomediastinal silhouette. Status post coronary artery bypass graft. No pneumothorax is noted. Stable scarring is seen throughout both lungs, most prominently seen in right upper lobe. Left upper lobe bulla is again noted with significant wall thickening consistent with infection and surrounding pneumonia. Bony thorax is unremarkable. IMPRESSION: Left upper lobe bulla is again noted with significant wall thickening consistent with infection and surrounding pneumonia as described on prior CT scan. Electronically Signed   By: Marijo Conception, M.D.   On: 12/24/2017 10:00   Dg Hips Bilat With Pelvis 2v  Result Date: 01/07/2018 CLINICAL DATA:  Pain in both hips for a week EXAM: DG HIP (WITH OR WITHOUT PELVIS) 2V BILAT COMPARISON:  CT 08/14/2017 FINDINGS: Mild SI joint degenerative changes. Pubic symphysis and rami are intact. 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 No fracture or malalignment. Vascular calcifications. IMPRESSION: Mild arthritis of the hips.  No acute osseous abnormality Electronically Signed   By: Donavan Foil M.D.   On: 01/07/2018 20:25     PERTINENT LAB RESULTS: CBC: Recent Labs    01/16/18 0616  01/17/18 0415  WBC 8.3 6.3  HGB 10.8* 11.2*  HCT 33.5* 35.4*  PLT 168 211   CMET CMP     Component Value Date/Time   NA 138 01/17/2018 0415   NA 141 09/30/2017 1707   K 3.8 01/17/2018 0415   CL 101 01/17/2018 0415   CO2 26 01/17/2018 0415   GLUCOSE 106 (H) 01/17/2018 0415   BUN 14 01/17/2018 0415   BUN 14 09/30/2017 1707   CREATININE 0.54 (L) 01/17/2018 0415   CREATININE 0.74 10/14/2016 1206   CALCIUM 9.3 01/17/2018 0415   PROT 7.3 01/11/2018 0014   ALBUMIN 3.5 01/11/2018 0014   AST 32 01/11/2018 0014   ALT 23 01/11/2018 0014   ALKPHOS 119 01/11/2018 0014   BILITOT 1.0 01/11/2018 0014   GFRNONAA >60 01/17/2018 0415   GFRAA >60 01/17/2018 0415    GFR Estimated Creatinine Clearance: 107 mL/min (A) (by C-G formula based on SCr of 0.54 mg/dL (L)). No results for input(s): LIPASE, AMYLASE in the last 72 hours. No results for input(s): CKTOTAL, CKMB, CKMBINDEX, TROPONINI in the last 72 hours. Invalid input(s): POCBNP No results for input(s): DDIMER in the last 72 hours. No results for input(s): HGBA1C in the last 72 hours. No results for input(s): CHOL, HDL, LDLCALC, TRIG, CHOLHDL, LDLDIRECT in the last 72 hours. No results for input(s): TSH, T4TOTAL, T3FREE, THYROIDAB in the last 72 hours.  Invalid input(s): FREET3 No results for input(s): VITAMINB12, FOLATE, FERRITIN, TIBC, IRON, RETICCTPCT in the last 72 hours. Coags: No results for input(s): INR in the last 72 hours.  Invalid input(s): PT Microbiology: Recent Results (from the past 240 hour(s))  Blood Culture (routine x 2)     Status: Abnormal   Collection Time: 01/11/18 12:15 AM  Result Value Ref Range Status   Specimen Description BLOOD LEFT ANTECUBITAL  Final   Special Requests   Final    BOTTLES DRAWN AEROBIC AND ANAEROBIC Blood Culture adequate volume   Culture  Setup Time   Final    GRAM POSITIVE COCCI IN BOTH AEROBIC AND ANAEROBIC BOTTLES CRITICAL RESULT CALLED TO, READ BACK BY AND VERIFIED WITH:  ESINCLAIR,PHARMD '@1900'  01/11/18 BY LHOWARD    Culture (A)  Final    STAPHYLOCOCCUS EPIDERMIDIS STAPHYLOCOCCUS SIMULANS THE SIGNIFICANCE OF ISOLATING THIS ORGANISM FROM A SINGLE SET OF BLOOD CULTURES WHEN MULTIPLE SETS ARE DRAWN IS UNCERTAIN. PLEASE NOTIFY THE MICROBIOLOGY DEPARTMENT WITHIN ONE WEEK IF SPECIATION AND SENSITIVITIES ARE REQUIRED. Performed at [facility], [place] [address] [place], [place] [address]    Report Status 01/16/2018 FINAL  Final  Blood Culture ID Panel (Reflexed)     Status: Abnormal   Collection Time: 01/11/18 12:15 AM  Result Value Ref Range Status   Enterococcus species NOT DETECTED NOT DETECTED Final   Listeria monocytogenes NOT DETECTED NOT DETECTED Final   Staphylococcus species DETECTED (A) NOT DETECTED Final    Comment: Methicillin (oxacillin) resistant coagulase negative staphylococcus. Possible blood culture contaminant (unless isolated from more than one blood culture draw or clinical case suggests pathogenicity). No antibiotic treatment is indicated for blood  culture contaminants. CRITICAL RESULT CALLED TO, READ BACK BY AND VERIFIED WITH: ESINCLAIR,PHARMD '@1900'  01/11/18 BY LHOWARD    Staphylococcus aureus NOT DETECTED NOT DETECTED Final   Methicillin resistance DETECTED (A) NOT DETECTED Final    Comment: CRITICAL RESULT CALLED TO, READ BACK BY AND VERIFIED WITH: ESINCLAIR,PHARMD '@1900'  01/11/18 BY LHOWARD    Streptococcus species NOT DETECTED NOT DETECTED Final   Streptococcus agalactiae NOT DETECTED NOT DETECTED Final   Streptococcus  pneumoniae NOT DETECTED NOT DETECTED Final   Streptococcus pyogenes NOT DETECTED NOT DETECTED Final   Acinetobacter baumannii NOT DETECTED NOT DETECTED Final   Enterobacteriaceae species NOT DETECTED NOT DETECTED Final   Enterobacter cloacae complex NOT DETECTED NOT DETECTED Final   Escherichia coli NOT DETECTED NOT DETECTED Final   Klebsiella oxytoca NOT DETECTED NOT DETECTED Final   Klebsiella pneumoniae NOT  DETECTED NOT DETECTED Final   Proteus species NOT DETECTED NOT DETECTED Final   Serratia marcescens NOT DETECTED NOT DETECTED Final   Haemophilus influenzae NOT DETECTED NOT DETECTED Final   Neisseria meningitidis NOT DETECTED NOT DETECTED Final   Pseudomonas aeruginosa NOT DETECTED NOT DETECTED Final   Candida albicans NOT DETECTED NOT DETECTED Final   Candida glabrata NOT DETECTED NOT DETECTED Final   Candida krusei NOT DETECTED NOT DETECTED Final   Candida parapsilosis NOT DETECTED NOT DETECTED Final   Candida tropicalis NOT DETECTED NOT DETECTED Final    Comment: Performed at [facility], [place] [address], [place], [place] [address]  Blood Culture (routine x 2)     Status: Abnormal (Preliminary result)   Collection Time: 01/11/18 12:20 AM  Result Value Ref Range Status   Specimen Description BLOOD RIGHT FOREARM  Final   Special Requests   Final    BOTTLES DRAWN AEROBIC AND ANAEROBIC Blood Culture adequate volume   Culture  Setup Time   Final    GRAM POSITIVE RODS ANAEROBIC BOTTLE ONLY CRITICAL RESULT CALLED TO, READ BACK BY AND VERIFIED WITH: E SINCLAIR,PHARMD AT 4481 01/11/18 BY L BENFIELD GRAM POSITIVE COCCI AEROBIC BOTTLE ONLY CRITICAL VALUE NOTED.  VALUE IS CONSISTENT WITH PREVIOUSLY REPORTED AND CALLED VALUE. Performed at [facility], [place] [address] [place], [place] [address]    Culture (A)  Final    ENTEROCOCCUS FAECALIS STAPHYLOCOCCUS HAEMOLYTICUS THE SIGNIFICANCE OF ISOLATING THIS ORGANISM FROM A SINGLE SET OF BLOOD CULTURES WHEN MULTIPLE SETS ARE DRAWN IS UNCERTAIN. PLEASE NOTIFY THE MICROBIOLOGY DEPARTMENT WITHIN ONE WEEK IF SPECIATION AND SENSITIVITIES ARE REQUIRED. CLOSTRIDIUM PERFRINGENS    Report Status PENDING  Incomplete   Organism ID, Bacteria ENTEROCOCCUS FAECALIS  Final      Susceptibility   Enterococcus faecalis - MIC*    AMPICILLIN <=2 SENSITIVE Sensitive     VANCOMYCIN 2 SENSITIVE Sensitive     GENTAMICIN SYNERGY SENSITIVE Sensitive     *  ENTEROCOCCUS FAECALIS  Blood Culture ID Panel (Reflexed)     Status: None   Collection Time: 01/11/18 12:20 AM  Result Value Ref Range Status   Enterococcus species NOT DETECTED NOT DETECTED Final   Listeria monocytogenes NOT DETECTED NOT DETECTED Final   Staphylococcus species NOT DETECTED NOT DETECTED Final   Staphylococcus aureus NOT DETECTED NOT DETECTED Final   Streptococcus species NOT DETECTED NOT DETECTED Final   Streptococcus agalactiae NOT DETECTED NOT DETECTED Final   Streptococcus pneumoniae NOT DETECTED NOT DETECTED Final   Streptococcus pyogenes NOT DETECTED NOT DETECTED Final   Acinetobacter baumannii NOT DETECTED NOT DETECTED Final   Enterobacteriaceae species NOT DETECTED NOT DETECTED Final   Enterobacter cloacae complex NOT DETECTED NOT DETECTED Final   Escherichia coli NOT DETECTED NOT DETECTED Final   Klebsiella oxytoca NOT DETECTED NOT DETECTED Final   Klebsiella pneumoniae NOT DETECTED NOT DETECTED Final   Proteus species NOT DETECTED NOT DETECTED Final   Serratia marcescens NOT DETECTED NOT DETECTED Final   Haemophilus influenzae NOT DETECTED NOT DETECTED Final   Neisseria meningitidis NOT DETECTED NOT DETECTED Final  Pseudomonas aeruginosa NOT DETECTED NOT DETECTED Final   Candida albicans NOT DETECTED NOT DETECTED Final   Candida glabrata NOT DETECTED NOT DETECTED Final   Candida krusei NOT DETECTED NOT DETECTED Final   Candida parapsilosis NOT DETECTED NOT DETECTED Final   Candida tropicalis NOT DETECTED NOT DETECTED Final    Comment: Performed at [facility], [place] [address] [place], [place] [address]  Urine culture     Status: None   Collection Time: 01/11/18  2:28 AM  Result Value Ref Range Status   Specimen Description URINE, CLEAN CATCH  Final   Special Requests NONE  Final   Culture   Final    NO GROWTH Performed at [facility], [place] [address], [place], [name] [address]    Report Status 01/12/2018 FINAL  Final  Culture, blood  (routine x 2)     Status: None   Collection Time: 01/12/18  6:40 AM  Result Value Ref Range Status   Specimen Description BLOOD LEFT HAND  Final   Special Requests   Final    BOTTLES DRAWN AEROBIC ONLY Blood Culture adequate volume   Culture   Final    NO GROWTH 5 DAYS Performed at [facility], [place] [address], [place], [place] [address]    Report Status 01/17/2018 FINAL  Final  Culture, blood (routine x 2)     Status: None   Collection Time: 01/12/18  6:49 AM  Result Value Ref Range Status   Specimen Description BLOOD RIGHT HAND  Final   Special Requests   Final    BOTTLES DRAWN AEROBIC ONLY Blood Culture adequate volume   Culture   Final    NO GROWTH 5 DAYS Performed at [facility], [place] [address], [place], [place] [address]    Report Status 01/17/2018 FINAL  Final  Culture, blood (routine x 2)     Status: None (Preliminary result)   Collection Time: 01/13/18  2:28 PM  Result Value Ref Range Status   Specimen Description BLOOD LEFT HAND  Final   Special Requests   Final    BOTTLES DRAWN AEROBIC ONLY Blood Culture results may not be optimal due to an inadequate volume of blood received in culture bottles   Culture   Final    NO GROWTH 4 DAYS Performed at [facility], [place] [address], [place], [place] [address]    Report Status PENDING  Incomplete  Culture, blood (routine x 2)     Status: None (Preliminary result)   Collection Time: 01/13/18  2:32 PM  Result Value Ref Range Status   Specimen Description BLOOD RIGHT HAND  Final   Special Requests   Final    BOTTLES DRAWN AEROBIC AND ANAEROBIC Blood Culture adequate volume   Culture   Final    NO GROWTH 4 DAYS Performed at [facility], [place] [address] [place],  [address]    Report Status PENDING  Incomplete    FURTHER DISCHARGE INSTRUCTIONS:  Get Medicines reviewed and adjusted: Please take all your medications with you for your next visit with your Primary MD  Laboratory/radiological  data: Please request your Primary MD to go over all hospital tests and procedure/radiological results at the follow up, please ask your Primary MD to get all Hospital records sent to his/her office.  In some cases, they will be blood work, cultures and biopsy results pending at the time of your discharge. Please request that your primary care  M.D. goes through all the records of your hospital data and follows up on these results.  Also Note the following: If you experience worsening of your admission symptoms, develop shortness of breath, life threatening emergency, suicidal or homicidal thoughts you must seek medical attention immediately by calling 911 or calling your MD immediately  if symptoms less severe.  You must read complete instructions/literature along with all the possible adverse reactions/side effects for all the Medicines you take and that have been prescribed to you. Take any new Medicines after you have completely understood and accpet all the possible adverse reactions/side effects.   Do not drive when taking Pain medications or sleeping medications (Benzodaizepines)  Do not take more than prescribed Pain, Sleep and Anxiety Medications. It is not advisable to combine anxiety,sleep and pain medications without talking with your primary care practitioner  Special Instructions: If you have smoked or chewed Tobacco  in the last 2 yrs please stop smoking, stop any regular Alcohol  and or any Recreational drug use.  Wear Seat belts while driving.  Please note: You were cared for by a hospitalist during your hospital stay. Once you are discharged, your primary care physician will handle any further medical issues. Please note that NO REFILLS for any discharge medications will be authorized once you are discharged, as it is imperative that you return to your primary care physician (or establish a relationship with a primary care physician if you do not have one) for your post hospital  discharge needs so that they can reassess your need for medications and monitor your lab values.  Total Time spent coordinating discharge including counseling, education and face to face time equals 45 minutes.  SignedOren Binet 01/17/2018 5:45 PM

## 2018-01-18 ENCOUNTER — Inpatient Hospital Stay (HOSPITAL_COMMUNITY): Payer: Medicare HMO

## 2018-01-18 DIAGNOSIS — K5900 Constipation, unspecified: Secondary | ICD-10-CM

## 2018-01-18 LAB — GLUCOSE, CAPILLARY
GLUCOSE-CAPILLARY: 197 mg/dL — AB (ref 65–99)
Glucose-Capillary: 90 mg/dL (ref 65–99)
Glucose-Capillary: 145 mg/dL — ABNORMAL HIGH (ref 65–99)
Glucose-Capillary: 146 mg/dL — ABNORMAL HIGH (ref 65–99)

## 2018-01-18 LAB — MAGNESIUM: MAGNESIUM: 1.8 mg/dL (ref 1.7–2.4)

## 2018-01-18 LAB — BASIC METABOLIC PANEL
Anion gap: 10 (ref 5–15)
BUN: 13 mg/dL (ref 6–20)
CALCIUM: 9 mg/dL (ref 8.9–10.3)
CO2: 28 mmol/L (ref 22–32)
CREATININE: 0.64 mg/dL (ref 0.61–1.24)
Chloride: 99 mmol/L — ABNORMAL LOW (ref 101–111)
GFR calc Af Amer: >60 mL/min (ref >60)
GFR calc non Af Amer: >60 mL/min (ref >60)
Glucose, Bld: 98 mg/dL (ref 65–99)
Potassium: 3.9 mmol/L (ref 3.5–5.1)
SODIUM: 137 mmol/L (ref 135–145)

## 2018-01-18 LAB — CULTURE, BLOOD (ROUTINE X 2)
Culture: NO GROWTH
Culture: NO GROWTH
Special Requests: ADEQUATE

## 2018-01-18 LAB — CBC WITH DIFFERENTIAL/PLATELET
Abs Immature Granulocytes: 0.1 10*3/uL (ref 0.0–0.1)
BASOS ABS: 0 10*3/uL (ref 0.0–0.1)
Basophils Relative: 0 %
EOS ABS: 0.1 10*3/uL (ref 0.0–0.7)
EOS PCT: 2 %
HCT: 35.7 % — ABNORMAL LOW (ref 39.0–52.0)
Hemoglobin: 11.3 g/dL — ABNORMAL LOW (ref 13.0–17.0)
Immature Granulocytes: 1 %
Lymphocytes Relative: 28 %
Lymphs Abs: 1.4 10*3/uL (ref 0.7–4.0)
MCH: 27.2 pg (ref 26.0–34.0)
MCHC: 31.7 g/dL (ref 30.0–36.0)
MCV: 86 fL (ref 78.0–100.0)
MONO ABS: 0.6 10*3/uL (ref 0.1–1.0)
Monocytes Relative: 13 %
Neutro Abs: 2.9 10*3/uL (ref 1.7–7.7)
Neutrophils Relative %: 56 %
Platelets: 205 10*3/uL (ref 150–400)
RBC: 4.15 MIL/uL — AB (ref 4.22–5.81)
RDW: 17.2 % — AB (ref 11.5–15.5)
WBC: 5.1 10*3/uL (ref 4.0–10.5)

## 2018-01-18 MED ORDER — BISACODYL 10 MG RE SUPP
10.0000 mg | Freq: Every day | RECTAL | Status: DC
  Filled 2018-01-18 (×3): qty 1

## 2018-01-18 MED ORDER — PANTOPRAZOLE SODIUM 40 MG PO TBEC
40.0000 mg | DELAYED_RELEASE_TABLET | Freq: Two times a day (BID) | ORAL | Status: DC
  Administered 2018-01-18 – 2018-01-24 (×13): 40 mg via ORAL
  Filled 2018-01-18 (×13): qty 1

## 2018-01-18 MED ORDER — MELATONIN 3 MG PO TABS
9.0000 mg | ORAL_TABLET | Freq: Every evening | ORAL | Status: DC | PRN
  Administered 2018-01-19 (×2): 9 mg via ORAL
  Filled 2018-01-18 (×6): qty 3

## 2018-01-18 MED ORDER — MAGNESIUM SULFATE IN D5W 1-5 GM/100ML-% IV SOLN
1.0000 g | Freq: Once | INTRAVENOUS | Status: AC
  Administered 2018-01-18: 1 g via INTRAVENOUS
  Filled 2018-01-18: qty 100

## 2018-01-18 MED ORDER — MAGNESIUM HYDROXIDE 400 MG/5ML PO SUSP
30.0000 mL | Freq: Two times a day (BID) | ORAL | Status: AC
  Filled 2018-01-18: qty 30

## 2018-01-18 MED ORDER — POLYETHYLENE GLYCOL 3350 17 G PO PACK
17.0000 g | PACK | Freq: Two times a day (BID) | ORAL | Status: DC
  Administered 2018-01-18 – 2018-01-23 (×10): 17 g via ORAL
  Filled 2018-01-18 (×10): qty 1

## 2018-01-18 NOTE — Evaluation (Signed)
Occupational Therapy Evaluation Patient Details Name: Joseph [name] MRN: [id number] DOB: 1951/07/28 Today's Date: 01/18/2018    History of Present Illness Pt. is a 67 y.o. M with advanced rheumatoid arthritis on chronic prednisone 15 mg daily, bolus emphysema, chronic hypoxic respiratory failure on home O2, just discharged from this hospital on 5/17 with plans to follow up at [facility] hematology. Patient readmitted on 5/19 with septic shock secondary to polymicrobial bacteremia left upper lobe pneumonia. Briefly managed in the intensive care unit - transferred 5/22.    Clinical Impression   At baseline, pt is able to ambulate with and without bilateral platform RW and complete basic ADL with modified independence. He has had multiple recent hospital admissions greatly impacting his strength and activity tolerance for ADL participation. He was able to complete functional mobility in hallway with min assist from OT and wife providing chair follow for safety using bilateral platform RW. Pt currently requires max assist for LB dressing tasks, set-up for UB ADL, and min assist for toileting hygiene. Pt would benefit from continued OT services to improve independence and safety with ADL and functional mobility prior to returning home with wife. Recommend home health OT services post-acute D/C. Will continue to follow while admitted.    Follow Up Recommendations  Home health OT;Supervision/Assistance - 24 hour    Equipment Recommendations  None recommended by OT    Recommendations for Other Services       Precautions / Restrictions Precautions Precautions: Fall Restrictions Weight Bearing Restrictions: No      Mobility Bed Mobility               General bed mobility comments: Pt seated at EOB by the time I returned with O2 tank.   Transfers Overall transfer level: Needs assistance Equipment used: Bilateral platform walker Transfers: Sit to/from Stand Sit to Stand: From elevated  surface;Min assist         General transfer comment: Bed elevated for transfer. Min assist to power up to standing.     Balance Overall balance assessment: Needs assistance Sitting-balance support: No upper extremity supported Sitting balance-Leahy Scale: Good Sitting balance - Comments: assist for shoes   Standing balance support: Bilateral upper extremity supported;During functional activity Standing balance-Leahy Scale: Poor Standing balance comment: reliant on external support                           ADL either performed or assessed with clinical judgement   ADL Overall ADL's : Needs assistance/impaired Eating/Feeding: Set up;Sitting   Grooming: Set up;Sitting   Upper Body Bathing: Sitting;Set up   Lower Body Bathing: Maximal assistance;Sit to/from stand   Upper Body Dressing : Set up;Sitting   Lower Body Dressing: Maximal assistance;Sit to/from stand Lower Body Dressing Details (indicate cue type and reason): spouse assisted to don shoes while OT retrieving O2 tank Toilet Transfer: Minimal assistance;Ambulation;RW(bilateral platform RW) Toilet Transfer Details (indicate cue type and reason): Simulated with sit<>stand followed by functional mobility.  Toileting- Clothing Manipulation and Hygiene: Minimal assistance;Sit to/from stand       Functional mobility during ADLs: Minimal assistance;Rolling walker(bilateral platform RW) General ADL Comments: Pt able to complete functional mobility in hallway ambulating approximately 200 feet with single standing rest break. He fatigues easily but is highly motivated to improve his strength.      Vision Patient Visual Report: No change from baseline Vision Assessment?: No apparent visual deficits     Perception  Praxis      Pertinent Vitals/Pain Pain Assessment: Faces Faces Pain Scale: Hurts little more Pain Location: chronic back pain; relieved with movement; reports improved after ambulation and  sitting in chair Pain Descriptors / Indicators: Aching Pain Intervention(s): Limited activity within patient's tolerance;Monitored during session;Repositioned     Hand Dominance Right   Extremity/Trunk Assessment Upper Extremity Assessment Upper Extremity Assessment: Generalized weakness;LUE deficits/detail;RUE deficits/detail RUE Deficits / Details: Bilateral hands with contractures secondary to RA but able to use thumb and index functionally LUE Deficits / Details: Bilateral hands with contractures secondary to RA but able to use thumb and index functionally   Lower Extremity Assessment Lower Extremity Assessment: Generalized weakness       Communication Communication Communication: No difficulties   Cognition Arousal/Alertness: Awake/alert Behavior During Therapy: WFL for tasks assessed/performed Overall Cognitive Status: Within Functional Limits for tasks assessed                                     General Comments  Wife present throughout session and providing chair follow.     Exercises     Shoulder Instructions      Home Living Family/patient expects to be discharged to:: Private residence Living Arrangements: Spouse/significant other Available Help at Discharge: Family;Available 24 hours/day Type of Home: House Home Access: Level entry     Home Layout: Two level Alternate Level Stairs-Number of Steps: has a Sports coach Shower/Tub: Producer, television/film/video: Handicapped height     Home Equipment: Art gallery manager;Wheelchair - manual;Grab bars - tub/shower;Grab bars - toilet;Shower seat - built in;Other (comment);Walker - 2 wheels(B platform RW)          Prior Functioning/Environment Level of Independence: Independent with assistive device(s)        Comments: Independent with adaptive equipment for ADL due to RA. Using bilateral platform RW for ambulation at times.        OT Problem List: Decreased  strength;Decreased activity tolerance;Impaired balance (sitting and/or standing);Pain;Decreased range of motion      OT Treatment/Interventions: Self-care/ADL training;Therapeutic exercise;Energy conservation;DME and/or AE instruction;Therapeutic activities;Patient/family education;Balance training    OT Goals(Current goals can be found in the care plan section) Acute Rehab OT Goals Patient Stated Goal: to get back to exercising OT Goal Formulation: With patient/family Time For Goal Achievement: 02/01/18 Potential to Achieve Goals: Good ADL Goals Pt Will Perform Grooming: with modified independence;standing;with adaptive equipment Pt Will Perform Lower Body Dressing: with supervision;sit to/from stand Pt Will Transfer to Toilet: with supervision;ambulating;bedside commode(BSC over toilet) Pt Will Perform Toileting - Clothing Manipulation and hygiene: with supervision;sit to/from stand Additional ADL Goal #1: Pt will demonstrate improved activity tolerance for ADL to complete 3 consecutive standing grooming tasks without therapeutic rest breaks.  OT Frequency: Min 2X/week   Barriers to D/C:            Co-evaluation              AM-PAC PT "6 Clicks" Daily Activity     Outcome Measure Help from another person eating meals?: None Help from another person taking care of personal grooming?: A Little Help from another person toileting, which includes using toliet, bedpan, or urinal?: A Little Help from another person bathing (including washing, rinsing, drying)?: A Little Help from another person to put on and taking off regular upper body clothing?: A Little Help from another person to put on and  taking off regular lower body clothing?: A Lot 6 Click Score: 18   End of Session Equipment Utilized During Treatment: Rolling walker;Gait belt;Oxygen Nurse Communication: Mobility status  Activity Tolerance: Patient tolerated treatment well Patient left: in chair;with call bell/phone  within reach;with family/visitor present  OT Visit Diagnosis: Muscle weakness (generalized) (M62.81)                Time: 9562-1308 OT Time Calculation (min): 31 min Charges:  OT General Charges $OT Visit: 1 Visit OT Evaluation $OT Eval Moderate Complexity: 1 Mod OT Treatments $Therapeutic Activity: 8-22 mins G-Codes:     Doristine Section, MS OTR/L  Pager: [phone]   Joseph [name] 01/18/2018, 3:40 PM

## 2018-01-18 NOTE — Progress Notes (Signed)
PROGRESS NOTE        PATIENT DETAILS Name: Joseph [name] Age: 67 y.o. Sex: male Date of Birth: [birth year] Admit Date: 01/11/2018 Admitting Physician Reyne Dumas, MD XJO:[id number], Early Chars, MD  Brief Narrative: Patient is a 67 y.o. 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  Briefly managed in the intensive care unit-upon stability, transfered to the triad hospitalist service on 5/22.  Further hospital course has been complicated by development of A. fib with RVR. Awaiting transfer to [facility] when bed available  Subjective:  Patient in bed, appears comfortable, denies any headache, no fever, no chest pain or pressure, no shortness of breath , mild constipation and abdominal ache. No focal weakness.  Assessment/Plan:  Septic shock secondary to polymicrobial bloodstream infection (enterococcus, CoNS and Clostridium perfringens) and left upper lobe pneumonia: Sepsis pathophysiology has resolved, he is clinically improved, infectious disease following with recommendations to complete 8 additional days of IV Unasyn from 8/24.  Upon his request he has been transferred to [facility] bed is awaited.  Neutropenia: Has had issues with recurrent neutropenia-as noted above, plans were to follow-up with his hematologist at [facility] for a bone marrow biopsy.  Unfortunately has had numerous recent hospitalizations for infection/sepsis in the past 6 months.   Neutropenia has improved-patient received 1 dose of Granix on 5/21.  Unlikely to have Felty syndrome as he does not have any evidence of splenomegaly. Dr. Sherral Hammers discussed with patient's hematologist on 5/23-regarding timing of bone marrow biopsy.  He is awaiting bed at [facility] spoken with the transfer center at [facility] on 5/24-he was unlikely to have a bed over the weekend-likely to be transferred early next week.  Chronic A. fib with RVR, [place] vas 2 score of at least 3: Occurred briefly while in the intensive care unit on 5/19-maintained sinus rhythm since then-only to revert back to A. fib with RVR earlier this morning.  Underwent ablation in 2018 by Dr. Lovena Le for atrial flutter.  Had spoken with EP service over the phone on 5/24-plans were to initiate anticoagulation and rate control agents if A. fib reoccurred-since it has-have started metoprolol and Eliquis.  Discussed case briefly with Dr. Debara Pickett over the phone on 5/25 as well.  If rate is not well controlled with metoprolol-may need Cardizem infusion.  [name] seems to be in rate controlled with few PVCs continue beta-blocker and Eliquis combination.  Recent echo shows EF of 60%.  Chronic hypoxic respiratory failure/COPD: Stable without any evidence of exacerbation.  CAD status post CABG 2007: no anginal symptoms-remains on aspirin  Chronic diastolic heart failure (EF 60-65% by TTE on 01/12/2018): Remains compensated  Rheumatoid arthritis: Continue prednisone daily.  Diarrhea: Has resolved-thought to have polymicrobial bloodstream infection due to possible bacterial translocation from diarrhea.  Recent C. difficile PCR on 5/4 was negative.  DM-2: CBGs remain stable with SSI.   Hyperlipidemia: Continue statin  Depression/insomnia: Appears stable-continue trazodone.  Mild constipation with  generalized abdominal ache.  KUB noted.  Placed on bowel regimen will monitor.    Telemetry (independently reviewed): A. fib with  RVR  DVT Prophylaxis:   Code Status: Full code  Family Communication: Spouse at bedside  Disposition Plan: Remain inpatient-transfer to [facility] when bed available  Antimicrobial agents: Anti-infectives (From admission, onward)   Start     Dose/Rate Route Frequency Ordered Stop   01/17/18 0000  Ampicillin-Sulbactam 3 g in sodium chloride 0.9 % 100 mL     3 g 200 mL/hr over 30 Minutes Intravenous Every 6 hours 01/17/18 1744     01/15/18 1430  Ampicillin-Sulbactam (UNASYN) 3 g in sodium chloride 0.9 % 100 mL IVPB  Status:  Discontinued     3 g 200 mL/hr over 30 Minutes Intravenous Every 6 hours 01/15/18 1405 01/15/18 1421   01/15/18 1430  Ampicillin-Sulbactam (UNASYN) 3 g in sodium chloride 0.9 % 100 mL IVPB     3 g 200 mL/hr over 30 Minutes Intravenous Every 6 hours 01/15/18 1421 01/23/18 1429   01/13/18 2255  vancomycin (VANCOCIN) 1,250 mg in sodium chloride 0.9 % 250 mL IVPB  Status:  Discontinued     1,250 mg 166.7 mL/hr over 90 Minutes Intravenous Every 8 hours 01/13/18 1512 01/15/18 1405   01/12/18 0800  piperacillin-tazobactam (ZOSYN) IVPB 3.375 g  Status:  Discontinued     3.375 g 12.5 mL/hr over 240 Minutes Intravenous Every 8 hours 01/11/18 0040 01/13/18 1218   01/11/18 1400  vancomycin (VANCOCIN) 1,250 mg in sodium chloride 0.9 % 250 mL IVPB  Status:  Discontinued     1,250 mg 166.7 mL/hr over 90 Minutes Intravenous Every 12 hours 01/11/18 0046 01/13/18 1512   01/11/18 0045  vancomycin (VANCOCIN) IVPB 750 mg/150 ml premix     750 mg 150 mL/hr over 60 Minutes Intravenous  Once 01/11/18 0040 01/11/18 0218   01/11/18 0030  piperacillin-tazobactam (ZOSYN) IVPB 3.375 g     3.375 g 100 mL/hr over 30 Minutes Intravenous  Once 01/11/18 0016 01/11/18 0105   01/11/18 0030  vancomycin (VANCOCIN) IVPB 1000 mg/200 mL premix     1,000 mg 200 mL/hr over 60 Minutes Intravenous  Once 01/11/18 0016 01/11/18 0146      Procedures: None  CONSULTS:  pulmonary/intensive  care and ID  Time spent: 25 minutes-Greater than 50% of this time was spent in counseling, explanation of diagnosis, planning of further management, and coordination of care.  MEDICATIONS: Scheduled Meds: . apixaban  5 mg Oral BID  . arformoterol  15 mcg Nebulization BID  . aspirin  81 mg Oral Daily  . budesonide (PULMICORT) nebulizer solution  0.5 mg Nebulization BID  . dextromethorphan-guaiFENesin  1 tablet Oral BID  . folic acid  1 mg Oral Daily  . insulin aspart  0-15 Units Subcutaneous TID WC  . insulin aspart  0-5 Units Subcutaneous QHS  . ipratropium-albuterol  3 mL Nebulization BID  . mouth rinse  15 mL Mouth Rinse BID  . metoprolol tartrate  50 mg Oral BID  . pantoprazole  40 mg Oral BID  . predniSONE  15 mg Oral Q breakfast  . pregabalin  75 mg Oral BID  . traZODone  25 mg Oral QHS  . vitamin C  500 mg Oral Daily   Continuous Infusions: . ampicillin-sulbactam (UNASYN) IV 3 g (01/18/18 1007)  . magnesium sulfate 1 - 4 g bolus IVPB     PRN Meds:.metoprolol tartrate, nitroGLYCERIN, ondansetron (ZOFRAN) IV, oxyCODONE-acetaminophen  PHYSICAL EXAM: Vital signs: Vitals:   01/18/18 0758 01/18/18 0800 01/18/18 1004 01/18/18 1226  BP:   107/60 111/63  Pulse:   78 74  Resp:   18   Temp:   98.2 F (36.8 C)   TempSrc:   Oral   SpO2: 99% 99% 99%   Weight:      Height:       Filed Weights   01/16/18 2055 01/17/18 2044 01/18/18 0500  Weight: 90.6 kg (199 lb 11.8 oz) 90.6 kg (199 lb 11.8 oz) 90.6 kg (199 lb 11.8 oz)   Body mass index is 25.64 kg/m.   General appearance:Awake, alert, not in any distress.  Eyes:no scleral icterus. HEENT: Atraumatic and Normocephalic Neck: supple, no JVD. Resp:Good air entry bilaterally,no rales or rhonchi CVS: S1 S2 irregular, no murmurs.  GI: Bowel sounds present, Non tender and not distended with no gaurding, rigidity or rebound. Extremities: B/L Lower Ext shows no edema, both legs are warm to touch Neurology:  Non  focal Psychiatric: Normal judgment and insight. Normal mood. Musculoskeletal:No digital cyanosis Skin:No Rash, warm and dry Wounds:N/A  I have personally reviewed following labs and imaging studies  LABORATORY DATA: CBC: Recent Labs  Lab 01/13/18 0355 01/14/18 0240 01/15/18 0305 01/15/18 1537 01/16/18 0616 01/17/18 0415 01/18/18 0553  WBC 2.9* 5.8 10.9*  --  8.3 6.3 5.1  NEUTROABS 1.4* 4.5  --  10.2*  --  3.9 2.9  HGB 8.9* 8.7* 9.7*  --  10.8* 11.2* 11.3*  HCT 27.6* 27.6* 30.2*  --  33.5* 35.4* 35.7*  MCV 84.7 84.7 84.6  --  85.5 85.9 86.0  PLT 121* 107* 141*  --  168 211 741    Basic Metabolic Panel: Recent Labs  Lab 01/12/18 0654  01/14/18 0240  01/15/18 0305 01/15/18 1537 01/16/18 0616 01/17/18 0415 01/18/18 0553  NA 144   < > 139  --  141  --  138 138 137  K 2.9*   < > 3.0*   < > 3.4* 4.0 3.8 3.8 3.9  CL 111   < > 109  --  105  --  103 101 99*  CO2 23   < > 21*  --  26  --  '26 26 28  ' GLUCOSE 149*   < > 99  --  122*  --  100* 106* 98  BUN 9   < > 11  --  8  --  '10 14 13  ' CREATININE 0.54*   < > 0.52*  --  0.47*  --  0.53* 0.54* 0.64  CALCIUM 8.3*   < > 8.2*  --  8.5*  --  8.9 9.3 9.0  MG 1.6*  --   --    < > 1.8 2.0 1.8 1.8 1.8  PHOS 3.6  --   --   --   --   --   --   --   --    < > = values in this interval not displayed.    GFR: Estimated Creatinine Clearance: 107 mL/min (by C-G formula based on SCr of 0.64 mg/dL).  Liver Function Tests: No results for input(s): AST, ALT, ALKPHOS, BILITOT, PROT, ALBUMIN in the last 168 hours. No results for input(s): LIPASE, AMYLASE in the last 168 hours. No results for input(s): AMMONIA in the last 168 hours.  Coagulation Profile: No results for input(s): INR, PROTIME in the last 168 hours.  Cardiac Enzymes: Recent Labs  Lab 01/12/18 2214 01/13/18 0355 01/13/18 6384  TROPONINI 0.07* 0.07* 0.07*    BNP (last 3 results) No results for input(s): PROBNP in the last 8760 hours.  HbA1C: No results for input(s):  HGBA1C in the last 72 hours.  CBG: Recent Labs  Lab 01/17/18 1203 01/17/18 1807 01/17/18 2043 01/18/18 0726 01/18/18 1131  GLUCAP 165* 132* 151* 90 145*    Lipid Profile: No results for input(s): CHOL, HDL, LDLCALC, TRIG, CHOLHDL, LDLDIRECT in the last 72 hours.  Thyroid Function Tests: No results for input(s): TSH, T4TOTAL, FREET4, T3FREE, THYROIDAB in the last 72 hours.  Anemia Panel: No results for input(s): VITAMINB12, FOLATE, FERRITIN, TIBC, IRON, RETICCTPCT in the last 72 hours.  Urine analysis:    Component Value Date/Time   COLORURINE YELLOW 01/11/2018 0225   APPEARANCEUR CLEAR 01/11/2018 0225   LABSPEC 1.013 01/11/2018 0225   PHURINE 5.0 01/11/2018 0225   GLUCOSEU NEGATIVE 01/11/2018 0225   HGBUR NEGATIVE 01/11/2018 0225   HGBUR negative 04/12/2008 1103   BILIRUBINUR NEGATIVE 01/11/2018 0225   KETONESUR NEGATIVE 01/11/2018 0225   PROTEINUR NEGATIVE 01/11/2018 0225   UROBILINOGEN 0.2 10/17/2012 1236   NITRITE NEGATIVE 01/11/2018 0225   LEUKOCYTESUR NEGATIVE 01/11/2018 0225    Sepsis Labs: Lactic Acid, Venous    Component Value Date/Time   LATICACIDVEN 2.4 ([place]) 01/11/2018 2234    MICROBIOLOGY: Recent Results (from the past 240 hour(s))  Blood Culture (routine x 2)     Status: Abnormal   Collection Time: 01/11/18 12:15 AM  Result Value Ref Range Status   Specimen Description BLOOD LEFT ANTECUBITAL  Final   Special Requests   Final    BOTTLES DRAWN AEROBIC AND ANAEROBIC Blood Culture adequate volume   Culture  Setup Time   Final    GRAM POSITIVE COCCI IN BOTH AEROBIC AND ANAEROBIC BOTTLES CRITICAL RESULT CALLED TO, READ BACK BY AND VERIFIED WITH: ESINCLAIR,PHARMD '@1900'  01/11/18 BY LHOWARD    Culture (A)  Final    STAPHYLOCOCCUS EPIDERMIDIS STAPHYLOCOCCUS SIMULANS THE SIGNIFICANCE OF ISOLATING THIS ORGANISM FROM A SINGLE SET OF BLOOD CULTURES WHEN MULTIPLE SETS ARE DRAWN IS UNCERTAIN. PLEASE NOTIFY THE MICROBIOLOGY DEPARTMENT WITHIN ONE WEEK IF  SPECIATION AND SENSITIVITIES ARE REQUIRED. Performed at [facility], [place] [address], [place], [place] [address]    Report Status 01/16/2018 FINAL  Final  Blood Culture ID Panel (Reflexed)     Status: Abnormal   Collection Time: 01/11/18 12:15 AM  Result Value Ref Range Status   Enterococcus species NOT DETECTED NOT DETECTED Final   Listeria monocytogenes NOT DETECTED NOT DETECTED Final   Staphylococcus species DETECTED (A) NOT DETECTED Final    Comment: Methicillin (oxacillin) resistant coagulase negative staphylococcus. Possible blood culture contaminant (unless isolated from more than one blood culture draw or clinical case suggests pathogenicity). No antibiotic treatment is indicated for blood  culture contaminants. CRITICAL RESULT CALLED TO, READ BACK BY AND VERIFIED WITH: ESINCLAIR,PHARMD '@1900'  01/11/18 BY LHOWARD    Staphylococcus aureus NOT DETECTED NOT DETECTED Final   Methicillin resistance DETECTED (A) NOT DETECTED Final    Comment: CRITICAL RESULT CALLED TO, READ BACK BY AND VERIFIED WITH: ESINCLAIR,PHARMD '@1900'  01/11/18 BY LHOWARD    Streptococcus species NOT DETECTED NOT DETECTED Final   Streptococcus agalactiae NOT DETECTED NOT DETECTED Final   Streptococcus pneumoniae NOT DETECTED NOT DETECTED Final   Streptococcus pyogenes NOT DETECTED NOT DETECTED Final   Acinetobacter baumannii NOT DETECTED NOT DETECTED Final   Enterobacteriaceae species NOT DETECTED NOT DETECTED Final   Enterobacter cloacae complex NOT DETECTED NOT DETECTED Final  Escherichia coli NOT DETECTED NOT DETECTED Final   Klebsiella oxytoca NOT DETECTED NOT DETECTED Final   Klebsiella pneumoniae NOT DETECTED NOT DETECTED Final   Proteus species NOT DETECTED NOT DETECTED Final   Serratia marcescens NOT DETECTED NOT DETECTED Final   Haemophilus influenzae NOT DETECTED NOT DETECTED Final   Neisseria meningitidis NOT DETECTED NOT DETECTED Final   Pseudomonas aeruginosa NOT DETECTED NOT DETECTED Final    Candida albicans NOT DETECTED NOT DETECTED Final   Candida glabrata NOT DETECTED NOT DETECTED Final   Candida krusei NOT DETECTED NOT DETECTED Final   Candida parapsilosis NOT DETECTED NOT DETECTED Final   Candida tropicalis NOT DETECTED NOT DETECTED Final    Comment: Performed at [facility], [place] [address], [place], [place] [address]  Blood Culture (routine x 2)     Status: Abnormal (Preliminary result)   Collection Time: 01/11/18 12:20 AM  Result Value Ref Range Status   Specimen Description BLOOD RIGHT FOREARM  Final   Special Requests   Final    BOTTLES DRAWN AEROBIC AND ANAEROBIC Blood Culture adequate volume   Culture  Setup Time   Final    GRAM POSITIVE RODS ANAEROBIC BOTTLE ONLY CRITICAL RESULT CALLED TO, READ BACK BY AND VERIFIED WITH: E SINCLAIR,PHARMD AT 3220 01/11/18 BY L BENFIELD GRAM POSITIVE COCCI AEROBIC BOTTLE ONLY CRITICAL VALUE NOTED.  VALUE IS CONSISTENT WITH PREVIOUSLY REPORTED AND CALLED VALUE. Performed at [facility], [place] [address], [place],  [address]    Culture (A)  Final    ENTEROCOCCUS FAECALIS STAPHYLOCOCCUS HAEMOLYTICUS THE SIGNIFICANCE OF ISOLATING THIS ORGANISM FROM A SINGLE SET OF BLOOD CULTURES WHEN MULTIPLE SETS ARE DRAWN IS UNCERTAIN. PLEASE NOTIFY THE MICROBIOLOGY DEPARTMENT WITHIN ONE WEEK IF SPECIATION AND SENSITIVITIES ARE REQUIRED. CLOSTRIDIUM PERFRINGENS SUSCEPTIBILITIES TO FOLLOW FOR  STAPHYLOCOCCUS HAEMOLYTICUS    Report Status PENDING  Incomplete   Organism ID, Bacteria ENTEROCOCCUS FAECALIS  Final      Susceptibility   Enterococcus faecalis - MIC*    AMPICILLIN <=2 SENSITIVE Sensitive     VANCOMYCIN 2 SENSITIVE Sensitive     GENTAMICIN SYNERGY SENSITIVE Sensitive     * ENTEROCOCCUS FAECALIS  Blood Culture ID Panel (Reflexed)     Status: None   Collection Time: 01/11/18 12:20 AM  Result Value Ref Range Status   Enterococcus species NOT DETECTED NOT DETECTED Final   Listeria monocytogenes NOT DETECTED NOT  DETECTED Final   Staphylococcus species NOT DETECTED NOT DETECTED Final   Staphylococcus aureus NOT DETECTED NOT DETECTED Final   Streptococcus species NOT DETECTED NOT DETECTED Final   Streptococcus agalactiae NOT DETECTED NOT DETECTED Final   Streptococcus pneumoniae NOT DETECTED NOT DETECTED Final   Streptococcus pyogenes NOT DETECTED NOT DETECTED Final   Acinetobacter baumannii NOT DETECTED NOT DETECTED Final   Enterobacteriaceae species NOT DETECTED NOT DETECTED Final   Enterobacter cloacae complex NOT DETECTED NOT DETECTED Final   Escherichia coli NOT DETECTED NOT DETECTED Final   Klebsiella oxytoca NOT DETECTED NOT DETECTED Final   Klebsiella pneumoniae NOT DETECTED NOT DETECTED Final   Proteus species NOT DETECTED NOT DETECTED Final   Serratia marcescens NOT DETECTED NOT DETECTED Final   Haemophilus influenzae NOT DETECTED NOT DETECTED Final   Neisseria meningitidis NOT DETECTED NOT DETECTED Final   Pseudomonas aeruginosa NOT DETECTED NOT DETECTED Final   Candida albicans NOT DETECTED NOT DETECTED Final   Candida glabrata NOT DETECTED NOT DETECTED Final   Candida krusei NOT DETECTED NOT DETECTED Final   Candida  parapsilosis NOT DETECTED NOT DETECTED Final   Candida tropicalis NOT DETECTED NOT DETECTED Final    Comment: Performed at [facility], [place] [address], [place], [place] [address]  Urine culture     Status: None   Collection Time: 01/11/18  2:28 AM  Result Value Ref Range Status   Specimen Description URINE, CLEAN CATCH  Final   Special Requests NONE  Final   Culture   Final    NO GROWTH Performed at [facility], [place] [address], [place], [place] [address]    Report Status 01/12/2018 FINAL  Final  Culture, blood (routine x 2)     Status: None   Collection Time: 01/12/18  6:40 AM  Result Value Ref Range Status   Specimen Description BLOOD LEFT HAND  Final   Special Requests   Final    BOTTLES DRAWN AEROBIC ONLY Blood Culture adequate volume   Culture    Final    NO GROWTH 5 DAYS Performed at [facility], [place] [address], [place], [place] [address]    Report Status 01/17/2018 FINAL  Final  Culture, blood (routine x 2)     Status: None   Collection Time: 01/12/18  6:49 AM  Result Value Ref Range Status   Specimen Description BLOOD RIGHT HAND  Final   Special Requests   Final    BOTTLES DRAWN AEROBIC ONLY Blood Culture adequate volume   Culture   Final    NO GROWTH 5 DAYS Performed at [facility], [place] [address], [place], [place] [address]    Report Status 01/17/2018 FINAL  Final  Culture, blood (routine x 2)     Status: None   Collection Time: 01/13/18  2:28 PM  Result Value Ref Range Status   Specimen Description BLOOD LEFT HAND  Final   Special Requests   Final    BOTTLES DRAWN AEROBIC ONLY Blood Culture results may not be optimal due to an inadequate volume of blood received in culture bottles   Culture   Final    NO GROWTH 5 DAYS Performed at [facility], [place] [address], [place], [place] [address]    Report Status 01/18/2018 FINAL  Final  Culture, blood (routine x 2)     Status: None   Collection Time: 01/13/18  2:32 PM  Result Value Ref Range Status   Specimen Description BLOOD RIGHT HAND  Final   Special Requests   Final    BOTTLES DRAWN AEROBIC AND ANAEROBIC Blood Culture adequate volume   Culture   Final    NO GROWTH 5 DAYS Performed at [facility], [place] [address] [place], [place] [address]    Report Status 01/18/2018 FINAL  Final    RADIOLOGY STUDIES/RESULTS: Dg Chest 2 View  Result Date: 01/02/2018 CLINICAL DATA:  Dyspnea. Re-evaluate left upper lobe cavitary lesion. EXAM: CHEST - 2 VIEW COMPARISON:  12/27/2017. FINDINGS: Stable enlarged cardiac silhouette and post CABG changes. The previously seen 7.6 cm left upper lobe cavitary lesion again measures 7.6 cm in maximum diameter. The this has interval increased density. Adjacent patchy opacity is unchanged. Right upper lobe bullous  changes again demonstrated. The interstitial markings remain prominent. Diffuse osteopenia. IMPRESSION: 1. Interval fluid accumulation within the previously demonstrated left upper lobe cavitary lesion. 2. Stable mild adjacent patchy pneumonia or atelectasis. 3. Stable cardiomegaly, chronic interstitial lung disease and right upper lobe bullous changes. Electronically Signed   By: Percell Locus.D.  On: 01/02/2018 12:55   Dg Chest 2 View  Result Date: 12/22/2017 CLINICAL DATA:  Rheumatoid flare up with fever for 2 days. Ex-smoker. EXAM: CHEST - 2 VIEW COMPARISON:  Radiographs 10/27/2017 and 11/08/2017.  CT 08/14/2017. FINDINGS: The heart size and mediastinal contours are stable status post median sternotomy and CABG. There is aortic atherosclerosis. Diffuse central airway thickening is present. There are multiple upper lobe bulla. Dominant left upper lobe lesion demonstrates interval increased wall thickening which may be secondary to superimposed infection or hemorrhage. There is no focal airspace disease, pleural effusion or pneumothorax. No acute osseous findings are evident. IMPRESSION: Interval increased wall thickening of dominant left upper lobe bulla, possibly due to super infection or hemorrhage. Diffuse central airway thickening. No focal airspace disease. Electronically Signed   By: Richardean Sale M.D.   On: 12/22/2017 10:50   Ct Chest W Contrast  Result Date: 01/11/2018 CLINICAL DATA:  Acute onset of generalized weakness and fever. Decreased oral intake. EXAM: CT CHEST WITH CONTRAST TECHNIQUE: Multidetector CT imaging of the chest was performed during intravenous contrast administration. CONTRAST:  177m OMNIPAQUE IOHEXOL 300 MG/ML  SOLN COMPARISON:  Chest radiograph performed earlier today at 12:22 a.m., and CT of the chest performed 12/22/2017 FINDINGS: Cardiovascular: The heart is normal in size. Diffuse coronary artery calcifications are seen. Scattered calcification noted along the  thoracic aorta and proximal great vessels. The descending thoracic aorta is borderline normal in caliber. Mediastinum/Nodes: The patient is status post median sternotomy. Visualized mediastinal nodes remain borderline normal in size. No pericardial effusion is identified. The thyroid gland is unremarkable. No axillary lymphadenopathy is seen. 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 Peripheral honeycombing is noted bilaterally. Mild interstitial prominence may reflect mild interstitial edema. No definite pleural effusion or pneumothorax is seen. Upper Abdomen: The visualized portions of the liver and spleen are unremarkable. The patient is status post cholecystectomy, with a clip noted at the gallbladder fossa. The visualized portions of the spleen are unremarkable. A tiny hiatal hernia is noted. Musculoskeletal: No acute osseous abnormalities are identified. The visualized musculature is unremarkable in appearance. IMPRESSION: 1. Multiple cavitations noted at the left lung apex, with new air-fluid levels, concerning for atypical infection superimposed on chronic bullous disease. 2. Mild interstitial prominence may reflect mild interstitial edema. 3. Diffuse coronary artery calcifications seen. 4. Tiny hiatal hernia noted. Electronically Signed   By: JGarald BaldingM.D.   On: 01/11/2018 04:23   Ct Chest W Contrast  Result Date: 12/22/2017 CLINICAL DATA:  Fever EXAM: CT CHEST WITH CONTRAST TECHNIQUE: Multidetector CT imaging of the chest was performed during intravenous contrast administration. CONTRAST:  840mISOVUE-300 IOPAMIDOL (ISOVUE-300) INJECTION 61% COMPARISON:  Chest radiograph December 22, 2016; chest CT August 14, 2017 FINDINGS: Cardiovascular: There is no thoracic aortic aneurysm or dissection. There is calcification at the origins of the left common carotid and  left subclavian arteries. Mild scattered calcification is noted elsewhere the great vessels. There is aortic atherosclerosis. There are foci of native coronary artery calcification. The patient is status post coronary artery bypass grafting. There is no major vessel pulmonary embolus. No pericardial effusion or pericardial thickening is evident. Mediastinum/Nodes: Thyroid appears normal. There are multiple subcentimeter mediastinal lymph nodes. There is a subcarinal lymph node measuring 1.3 x 1.0 cm. There is a second sub- carinal lymph node measuring 1.2 x 1.0 cm. There is a small hiatal hernia. Lungs/Pleura:  There is underlying bullous emphysematous change. Extensive bullous disease is noted in both upper lobes. In comparison with the previous study, there is a bulla in the posterior segment left upper lobe measuring 7.7 x 4.6 cm. The wall of this bulla has become considerably thicker compared to most recent study. There is also thickening along the nearby major fissure with localized consolidation adjacent to the major fissure in the posterior segment of the left upper lobe. There is no other area of consolidation. There are areas of scarring and atelectatic change in the lung bases. There are scattered areas of fibrosis which appear stable. There is a nodular opacity along the right major fissure measuring 9 x 5 mm, stable and likely representing an intrafissural lymph node. Upper Abdomen: Gallbladder is absent. Spleen is incompletely visualized. Spleen measures 13.8 cm from anterior to posterior dimension which is prominent. There is upper abdominal aortic atherosclerosis. Musculoskeletal: No blastic or lytic bone lesions are evident. Patient is status post median sternotomy. No chest wall lesions are evident. IMPRESSION: 1. 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 These changes are similar compared to prior study. Note, however, that the largest bulla, located in  the posterior segment left upper lobe, shows considerable wall thickening, a change from the previous study. There is a nearby area of airspace consolidation in the posterior segment of the left upper lobe adjacent to the major fissure, felt to represent pneumonia. The thickening of the wall of the bulla is most likely due to infection of this bulla. Atypical cystic neoplasm may present in this manner and cannot be entirely excluded at this time. Note that there is no evidence of mycetoma in this bulla or elsewhere. Given this circumstance, would advise repeat CT after treatment for pneumonia to assess for stability or possible resolution of wall thickening in this bulla. Given the possibility of underlying neoplasm, close clinical and imaging surveillance advised in this regard. 2. Mildly prominent sub-carinal lymph nodes of uncertain etiology. These lymph nodes could well be reactive given the parenchymal lung changes. 3. Status post coronary artery bypass grafting. There is aortic and great vessel atherosclerotic calcification as well as calcification in native coronary arteries. 4.  Small hiatal hernia. 5.  Spleen appears enlarged although incompletely visualized. 6.  Gallbladder surgically absent. Aortic Atherosclerosis (ICD10-I70.0) and Emphysema (ICD10-J43.9). Electronically Signed   By: Lowella Grip III M.D.   On: 12/22/2017 13:17   Ct Abdomen Pelvis W Contrast  Result Date: 01/13/2018 CLINICAL DATA:  Painless diarrhea EXAM: CT ABDOMEN AND PELVIS WITH CONTRAST TECHNIQUE: Multidetector CT imaging of the abdomen and pelvis was performed using the standard protocol following bolus administration of intravenous contrast. CONTRAST:  170m OMNIPAQUE IOHEXOL 300 MG/ML  SOLN COMPARISON:  08/14/2017 FINDINGS: LOWER CHEST: Small left pleural effusion. Emphysema and bibasilar atelectasis. HEPATOBILIARY: Normal hepatic contours and density. No intra- or extrahepatic biliary dilatation. Status post  cholecystectomy. PANCREAS: Normal parenchymal contours without ductal dilatation. No peripancreatic fluid collection. SPLEEN: Normal. ADRENALS/URINARY TRACT: --Adrenal glands: Normal. --Right kidney/ureter: Multiple nonobstructing renal stones measuring up 2 7 mm. No hydronephrosis. No ureterolithiasis. --Left kidney/ureter: Tiny non-obstructing 1-2 mm stone at the lower pole. No hydronephrosis. --Urinary bladder: Normal for degree of distention STOMACH/BOWEL: --Stomach/Duodenum: No hiatal hernia or other gastric abnormality. Normal duodenal course. --Small bowel: No dilatation or inflammation. --Colon: There is a fecal management system in the rectum. There is sigmoid diverticulosis without acute inflammation. The remainder of the colon is normal. --Appendix: Normal. VASCULAR/LYMPHATIC: Atherosclerotic calcification is  present within the non-aneurysmal abdominal aorta, without hemodynamically significant stenosis. The portal vein, splenic vein, superior mesenteric vein and IVC are patent. No abdominal or pelvic lymphadenopathy. REPRODUCTIVE: Normal prostate and seminal vesicles. MUSCULOSKELETAL. No bony spinal canal stenosis or focal osseous abnormality. OTHER: None. IMPRESSION: 1. No acute abnormality of the abdomen or pelvis. 2. Small left pleural effusion. 3. Bilateral nonobstructive nephrolithiasis. 4. Aortic Atherosclerosis (ICD10-I70.0) and Emphysema (ICD10-J43.9). Electronically Signed   By: Ulyses Jarred M.D.   On: 01/13/2018 17:57   Dg Chest Port 1 View  Result Date: 01/11/2018 CLINICAL DATA:  Sepsis, fever and weakness.  Dyspnea x1 day. EXAM: PORTABLE CHEST 1 VIEW COMPARISON:  Chest CT 12/22/2017, CXR 01/02/2018 FINDINGS: Stable cardiomegaly with post CABG change and aortic atherosclerosis. Redemonstration of left apical bullae, the largest is partially filled with fluid and thick-walled compatible with an infected bulla. This is slightly smaller in appearance than on prior more cavitary in appearance  than recent comparison chest radiograph. Subsegmental scarring is seen in the right upper lobe and mid lung. Probable trace right effusion. No acute osseous abnormality. IMPRESSION: 1. Cardiomegaly with aortic atherosclerosis and post CABG change. 2. Thick-walled left upper lobe bulla compatible with an infected bulla is redemonstrated though slightly more cavitary in appearance and less opacified than on prior comparison. This would suggest some partial clearing of fluid within. Electronically Signed   By: Ashley Royalty M.D.   On: 01/11/2018 00:46   Dg Chest Port 1 View  Result Date: 12/27/2017 CLINICAL DATA:  Sepsis. EXAM: PORTABLE CHEST 1 VIEW COMPARISON:  Chest radiograph 12/27/2017, chest CT 12/22/2017 FINDINGS: Postsurgical changes from CABG. Cardiomediastinal silhouette is normal. Mediastinal contours appear intact. Emphysema, chronic interstitial lung changes. Previously noted thick-walled cystic structure in the left upper lobe has increased in size no measuring 7.6 cm, with increased surrounding airspace consolidation. Osseous structures are without acute abnormality. Soft tissues are grossly normal. IMPRESSION: Known left upper lobe thick-walled cavitary lesion has increased in size with increased surrounding airspace consolidation. Emphysema with chronic interstitial lung changes. Electronically Signed   By: Fidela Salisbury M.D.   On: 12/27/2017 17:34   Dg Chest Port 1 View  Result Date: 12/24/2017 CLINICAL DATA:  Pneumonia. EXAM: PORTABLE CHEST 1 VIEW COMPARISON:  CT scan and radiographs of December 22, 2017. FINDINGS: Stable cardiomediastinal silhouette. Status post coronary artery bypass graft. No pneumothorax is noted. Stable scarring is seen throughout both lungs, most prominently seen in right upper lobe. Left upper lobe bulla is again noted with significant wall thickening consistent with infection and surrounding pneumonia. Bony thorax is unremarkable. IMPRESSION: Left upper lobe bulla is  again noted with significant wall thickening consistent with infection and surrounding pneumonia as described on prior CT scan. Electronically Signed   By: Marijo Conception, M.D.   On: 12/24/2017 10:00   Dg Abd Portable 1v  Result Date: 01/18/2018 CLINICAL DATA:  Acute generalized abdominal pain. EXAM: PORTABLE ABDOMEN - 1 VIEW COMPARISON:  Radiograph of August 14, 2017. FINDINGS: No abnormal bowel dilatation is noted. Moderate amount of stool seen in the right colon. No radio-opaque calculi or other significant radiographic abnormality are seen. Status post cholecystectomy. IMPRESSION: Moderate stool burden.  No evidence of bowel obstruction or ileus. Electronically Signed   By: Marijo Conception, M.D.   On: 01/18/2018 10:14   Dg Hips Bilat With Pelvis 2v  Result Date: 01/07/2018 CLINICAL DATA:  Pain in both hips for a week EXAM: DG HIP (WITH OR WITHOUT PELVIS) 2V BILAT COMPARISON:  CT 08/14/2017 FINDINGS: Mild SI joint degenerative changes. Pubic symphysis and rami are intact. 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 No fracture or malalignment. Vascular calcifications. IMPRESSION: Mild arthritis of the hips.  No acute osseous abnormality Electronically Signed   By: Donavan Foil M.D.   On: 01/07/2018 20:25     LOS: 7 days   Signature  Lala Lund M.D on 01/18/2018 at 12:37 PM  Between 7am to 7pm - Pager - [phone] ( page via [email or web address], text pages only, please mention full 10 digit call back number).  After 7pm go to [email or web address] - password [email or web address]

## 2018-01-18 NOTE — Progress Notes (Signed)
Telemetry called, multiple PVC's, v/s stable, patient alert and oriented resting comfortable in bed

## 2018-01-18 NOTE — Progress Notes (Signed)
Subjective:  Complaining of significant abdominal pain and just had a KUB that shows large stool burden  Antibiotics:  Anti-infectives (From admission, onward)   Start     Dose/Rate Route Frequency Ordered Stop   01/17/18 0000  Ampicillin-Sulbactam 3 g in sodium chloride 0.9 % 100 mL     3 g 200 mL/hr over 30 Minutes Intravenous Every 6 hours 01/17/18 1744     01/15/18 1430  Ampicillin-Sulbactam (UNASYN) 3 g in sodium chloride 0.9 % 100 mL IVPB  Status:  Discontinued     3 g 200 mL/hr over 30 Minutes Intravenous Every 6 hours 01/15/18 1405 01/15/18 1421   01/15/18 1430  Ampicillin-Sulbactam (UNASYN) 3 g in sodium chloride 0.9 % 100 mL IVPB     3 g 200 mL/hr over 30 Minutes Intravenous Every 6 hours 01/15/18 1421 01/23/18 1429   01/13/18 2255  vancomycin (VANCOCIN) 1,250 mg in sodium chloride 0.9 % 250 mL IVPB  Status:  Discontinued     1,250 mg 166.7 mL/hr over 90 Minutes Intravenous Every 8 hours 01/13/18 1512 01/15/18 1405   01/12/18 0800  piperacillin-tazobactam (ZOSYN) IVPB 3.375 g  Status:  Discontinued     3.375 g 12.5 mL/hr over 240 Minutes Intravenous Every 8 hours 01/11/18 0040 01/13/18 1218   01/11/18 1400  vancomycin (VANCOCIN) 1,250 mg in sodium chloride 0.9 % 250 mL IVPB  Status:  Discontinued     1,250 mg 166.7 mL/hr over 90 Minutes Intravenous Every 12 hours 01/11/18 0046 01/13/18 1512   01/11/18 0045  vancomycin (VANCOCIN) IVPB 750 mg/150 ml premix     750 mg 150 mL/hr over 60 Minutes Intravenous  Once 01/11/18 0040 01/11/18 0218   01/11/18 0030  piperacillin-tazobactam (ZOSYN) IVPB 3.375 g     3.375 g 100 mL/hr over 30 Minutes Intravenous  Once 01/11/18 0016 01/11/18 0105   01/11/18 0030  vancomycin (VANCOCIN) IVPB 1000 mg/200 mL premix     1,000 mg 200 mL/hr over 60 Minutes Intravenous  Once 01/11/18 0016 01/11/18 0146      Medications: Scheduled Meds: . apixaban  5 mg Oral BID  . arformoterol  15 mcg Nebulization BID  . aspirin  81 mg Oral  Daily  . budesonide (PULMICORT) nebulizer solution  0.5 mg Nebulization BID  . dextromethorphan-guaiFENesin  1 tablet Oral BID  . folic acid  1 mg Oral Daily  . insulin aspart  0-15 Units Subcutaneous TID WC  . insulin aspart  0-5 Units Subcutaneous QHS  . ipratropium-albuterol  3 mL Nebulization BID  . mouth rinse  15 mL Mouth Rinse BID  . metoprolol tartrate  50 mg Oral BID  . pantoprazole  40 mg Oral BID  . predniSONE  15 mg Oral Q breakfast  . pregabalin  75 mg Oral BID  . traZODone  25 mg Oral QHS  . vitamin C  500 mg Oral Daily   Continuous Infusions: . ampicillin-sulbactam (UNASYN) IV 3 g (01/18/18 1007)   PRN Meds:.metoprolol tartrate, nitroGLYCERIN, ondansetron (ZOFRAN) IV, oxyCODONE-acetaminophen    Objective: Weight change: 0 lb (0 kg)  Intake/Output Summary (Last 24 hours) at 01/18/2018 1104 Last data filed at 01/18/2018 0700 Gross per 24 hour  Intake 1380 ml  Output 1725 ml  Net -345 ml   Blood pressure 107/60, pulse 78, temperature 98.2 F (36.8 C), temperature source Oral, resp. rate 18, height _0  (1.88 m), weight 199 lb 11.8 oz (90.6 kg), SpO2 99 %. Temp:  [97.6  F (36.4 C)-98.5 F (36.9 C)] 98.2 F (36.8 C) (05/26 1004) Pulse Rate:  [64-78] 78 (05/26 1004) Resp:  [16-18] 18 (05/26 1004) BP: (107-136)/(51-78) 107/60 (05/26 1004) SpO2:  [98 %-100 %] 99 % (05/26 1004) Weight:  [199 lb 11.8 oz (90.6 kg)] 199 lb 11.8 oz (90.6 kg) (05/26 0500)  Physical Exam: General: Alert and awake, oriented x3, not in any acute distress. HEENT: anicteric sclera, EOMI CVS irrr irregular rate, no murmurs gallops rubs heard Chest: , no wheezing, rales or resp distress fairly clear anteriorly Abdomen: soft slightly distended minimally tender  Skeletal extremities: RA changes Skin: mx bruises Neuro: nonfocal  CBC: CBC Latest Ref Rng & Units 01/18/2018 01/17/2018 01/16/2018  WBC 4.0 - 10.5 K/uL 5.1 6.3 8.3  Hemoglobin 13.0 - 17.0 g/dL 11.3(L) 11.2(L) 10.8(L)  Hematocrit  39.0 - 52.0 % 35.7(L) 35.4(L) 33.5(L)  Platelets 150 - 400 K/uL 205 211 168      BMET Recent Labs    01/17/18 0415 01/18/18 0553  NA 138 137  K 3.8 3.9  CL 101 99*  CO2 26 28  GLUCOSE 106* 98  BUN 14 13  CREATININE 0.54* 0.64  CALCIUM 9.3 9.0     Liver Panel  No results for input(s): PROT, ALBUMIN, AST, ALT, ALKPHOS, BILITOT, BILIDIR, IBILI in the last 72 hours.     Sedimentation Rate No results for input(s): ESRSEDRATE in the last 72 hours. C-Reactive Protein No results for input(s): CRP in the last 72 hours.  Micro Results: Recent Results (from the past 720 hour(s))  Blood Culture (routine x 2)     Status: None   Collection Time: 12/22/17 10:46 AM  Result Value Ref Range Status   Specimen Description   Final    BLOOD LEFT ARM Performed at [facility], [address] [place], [place] [address]    Special Requests   Final    BOTTLES DRAWN AEROBIC AND ANAEROBIC Blood Culture adequate volume Performed at [facility], [address] [place], [place] [address]    Culture   Final    NO GROWTH 5 DAYS Performed at [facility], [place] [address], [place], [place] [address]    Report Status 12/27/2017 FINAL  Final  Blood Culture (routine x 2)     Status: None   Collection Time: 12/22/17 10:55 AM  Result Value Ref Range Status   Specimen Description   Final    BLOOD LEFT HAND Performed at [facility], [address] [place], [place] [address]    Special Requests   Final    BOTTLES DRAWN AEROBIC AND ANAEROBIC Blood Culture adequate volume Performed at [facility], [address] [place], [place] [address]    Culture   Final    NO GROWTH 5 DAYS Performed at [facility], [place] [address] [place], [place] [address]    Report Status 12/27/2017 FINAL  Final  MRSA PCR Screening     Status: None   Collection Time: 12/22/17  8:41 PM  Result Value Ref Range Status   MRSA by PCR NEGATIVE NEGATIVE Final     Comment:        The GeneXpert MRSA Assay (FDA approved for NASAL specimens only), is one component of a comprehensive MRSA colonization surveillance program. It is not intended to diagnose MRSA infection nor to guide or monitor treatment for MRSA infections. Performed at [facility], [place] [address] [place], [place] [address]   Culture, sputum-assessment     Status: None   Collection Time: 12/23/17  1:10 AM  Result Value Ref Range Status   Specimen Description SPUTUM  Final   Special Requests NONE  Final   Sputum evaluation   Final    THIS SPECIMEN IS ACCEPTABLE FOR SPUTUM CULTURE Performed at [facility], [place] [address] [place], [place] [address]    Report Status 12/23/2017 FINAL  Final  Culture, respiratory (NON-Expectorated)     Status: None   Collection Time: 12/23/17  1:10 AM  Result Value Ref Range Status   Specimen Description   Final    SPUTUM Performed at [facility], [place] [address] [place], [place] [address]    Special Requests   Final    NONE Reflexed from [id number] Performed at [place] [address], [place], [place] [address]    Gram Stain   Final    FEW WBC PRESENT, PREDOMINANTLY PMN RARE GRAM POSITIVE COCCI    Culture   Final    FEW Consistent with normal respiratory flora. Performed at [facility], [place] [address], [place], [place] [address]    Report Status 12/25/2017 FINAL  Final  Blood Culture (routine x 2)     Status: None   Collection Time: 12/27/17  5:21 PM  Result Value Ref Range Status   Specimen Description BLOOD LEFT FOREARM  Final   Special Requests   Final    BOTTLES DRAWN AEROBIC AND ANAEROBIC Blood Culture adequate volume   Culture   Final    NO GROWTH 5 DAYS Performed at [facility], [place] [address], [place], [place] [address]    Report Status 01/01/2018 FINAL  Final  Blood Culture (routine x 2)     Status: None   Collection Time:  12/27/17  5:22 PM  Result Value Ref Range Status   Specimen Description BLOOD RIGHT WRIST  Final   Special Requests   Final    BOTTLES DRAWN AEROBIC AND ANAEROBIC Blood Culture adequate volume   Culture   Final    NO GROWTH 5 DAYS Performed at [facility], [place] [address], [place], [place] [address]    Report Status 01/01/2018 FINAL  Final  C difficile quick scan w PCR reflex     Status: None   Collection Time: 12/27/17  8:10 PM  Result Value Ref Range Status   C Diff antigen NEGATIVE NEGATIVE Final   C Diff toxin NEGATIVE NEGATIVE Final   C Diff interpretation No C. difficile detected.  Final    Comment: Performed at [facility], [place] [address] [place], [place] [address]  Gastrointestinal Panel by PCR , Stool     Status: None   Collection Time: 12/27/17  8:10 PM  Result Value Ref Range Status   Campylobacter species NOT DETECTED NOT DETECTED Final   Plesimonas shigelloides NOT DETECTED NOT DETECTED Final   Salmonella species NOT DETECTED NOT DETECTED Final   Yersinia enterocolitica NOT DETECTED NOT DETECTED Final   Vibrio species NOT DETECTED NOT DETECTED Final   Vibrio cholerae NOT DETECTED NOT DETECTED Final   Enteroaggregative E coli (EAEC) NOT DETECTED NOT DETECTED Final   Enteropathogenic E coli (EPEC) NOT DETECTED NOT DETECTED Final   Enterotoxigenic E coli (ETEC) NOT DETECTED NOT DETECTED Final   Shiga like toxin producing E coli (STEC) NOT DETECTED NOT DETECTED Final   Shigella/Enteroinvasive E coli (EIEC)  NOT DETECTED NOT DETECTED Final   Cryptosporidium NOT DETECTED NOT DETECTED Final   Cyclospora cayetanensis NOT DETECTED NOT DETECTED Final   Entamoeba histolytica NOT DETECTED NOT DETECTED Final   Giardia lamblia NOT DETECTED NOT DETECTED Final   Adenovirus F40/41 NOT DETECTED NOT DETECTED Final   Astrovirus NOT DETECTED NOT DETECTED Final   Norovirus GI/GII NOT DETECTED NOT DETECTED Final   Rotavirus A NOT DETECTED NOT DETECTED Final   Sapovirus (I,  II, IV, and V) NOT DETECTED NOT DETECTED Final    Comment: Performed at [facility], [address] [place], [place] [address]  MRSA PCR Screening     Status: None   Collection Time: 12/28/17  5:27 PM  Result Value Ref Range Status   MRSA by PCR NEGATIVE NEGATIVE Final    Comment:        The GeneXpert MRSA Assay (FDA approved for NASAL specimens only), is one component of a comprehensive MRSA colonization surveillance program. It is not intended to diagnose MRSA infection nor to guide or monitor treatment for MRSA infections. Performed at [facility], [place] [address], [place], [place] [address]   Culture, expectorated sputum-assessment     Status: None   Collection Time: 12/31/17  6:53 PM  Result Value Ref Range Status   Specimen Description EXPECTORATED SPUTUM  Final   Special Requests   Final    NONE Performed at [facility], [address] [place], [place] [address]    Sputum evaluation   Final    Sputum specimen not acceptable for testing.  Please recollect.   Results Called to: J. THACKER, RN AT 2327 ON 12/31/17 BY C. JESSUP, MLT.    Report Status 12/31/2017 FINAL  Final  Fungus Culture With Stain     Status: None   Collection Time: 12/31/17  6:53 PM  Result Value Ref Range Status   Fungus Stain Final report  Final   Fungus (Mycology) Culture Preliminary report  Final    Comment: (NOTE) Performed At: [facility] [address] [place] [address] [place], [place] [id number] Rush Farmer MD [id number]    Fungal Source SPUTUM  Final    Comment: Performed at [facility], [place] [address], [place], [place] [address]  Acid Fast Smear (AFB)     Status: None   Collection Time: 12/31/17  6:53 PM  Result Value Ref Range Status   AFB Specimen Processing Concentration  Final   Acid Fast Smear Negative  Final    Comment: (NOTE) Performed At: [facility] [place], [place] [id number] Rush Farmer MD [id number]    Source  (AFB) SPUTUM  Final    Comment: Performed at [facility], [place] [address], [place], [place] [address]  Fungus Culture Result     Status: None   Collection Time: 12/31/17  6:53 PM  Result Value Ref Range Status   Result 1 Yeast observed  Final    Comment: (NOTE) Performed At: [facility] [place], [place] [id number] Rush Farmer MD [id number] Performed at [facility], [place] [address], [place], [place] [address]   Fungal organism reflex     Status: None   Collection Time: 12/31/17  6:53 PM  Result Value Ref Range Status   Fungal result 1 Candida albicans  Final    Comment: (NOTE) Moderate growth Performed At: [facility] [address] [place], [place] [id number] Rush Farmer MD GY:[id number] Performed at [facility], [address] [place], [place] [address]   Blood Culture (routine x 2)     Status: Abnormal   Collection Time: 01/11/18 12:15 AM  Result Value Ref Range Status   Specimen Description BLOOD LEFT ANTECUBITAL  Final   Special Requests   Final    BOTTLES DRAWN AEROBIC AND ANAEROBIC Blood Culture adequate volume   Culture  Setup Time   Final    GRAM POSITIVE COCCI IN BOTH AEROBIC AND ANAEROBIC BOTTLES CRITICAL RESULT CALLED TO, READ BACK BY AND VERIFIED WITH: ESINCLAIR,PHARMD _0  01/11/18 BY LHOWARD    Culture (A)  Final    STAPHYLOCOCCUS EPIDERMIDIS STAPHYLOCOCCUS SIMULANS THE SIGNIFICANCE OF ISOLATING THIS ORGANISM FROM A SINGLE SET OF BLOOD CULTURES WHEN MULTIPLE SETS ARE DRAWN IS UNCERTAIN. PLEASE NOTIFY THE MICROBIOLOGY DEPARTMENT WITHIN ONE WEEK IF SPECIATION AND SENSITIVITIES ARE REQUIRED. Performed at [facility], [place] [address] [place], [place] [address]    Report Status 01/16/2018 FINAL  Final  Blood Culture ID Panel (Reflexed)     Status: Abnormal   Collection Time: 01/11/18 12:15 AM  Result Value Ref Range Status   Enterococcus species NOT DETECTED NOT DETECTED Final   Listeria  monocytogenes NOT DETECTED NOT DETECTED Final   Staphylococcus species DETECTED (A) NOT DETECTED Final    Comment: Methicillin (oxacillin) resistant coagulase negative staphylococcus. Possible blood culture contaminant (unless isolated from more than one blood culture draw or clinical case suggests pathogenicity). No antibiotic treatment is indicated for blood  culture contaminants. CRITICAL RESULT CALLED TO, READ BACK BY AND VERIFIED WITH: ESINCLAIR,PHARMD _1  01/11/18 BY LHOWARD    Staphylococcus aureus NOT DETECTED NOT DETECTED Final   Methicillin resistance DETECTED (A) NOT DETECTED Final    Comment: CRITICAL RESULT CALLED TO, READ BACK BY AND VERIFIED WITH: ESINCLAIR,PHARMD _2  01/11/18 BY LHOWARD    Streptococcus species NOT DETECTED NOT DETECTED Final   Streptococcus agalactiae NOT DETECTED NOT DETECTED Final   Streptococcus pneumoniae NOT DETECTED NOT DETECTED Final   Streptococcus pyogenes NOT DETECTED NOT DETECTED Final   Acinetobacter baumannii NOT DETECTED NOT DETECTED Final   Enterobacteriaceae species NOT DETECTED NOT DETECTED Final   Enterobacter cloacae complex NOT DETECTED NOT DETECTED Final   Escherichia coli NOT DETECTED NOT DETECTED Final   Klebsiella oxytoca NOT DETECTED NOT DETECTED Final   Klebsiella pneumoniae NOT DETECTED NOT DETECTED Final   Proteus species NOT DETECTED NOT DETECTED Final   Serratia marcescens NOT DETECTED NOT DETECTED Final   Haemophilus influenzae NOT DETECTED NOT DETECTED Final   Neisseria meningitidis NOT DETECTED NOT DETECTED Final   Pseudomonas aeruginosa NOT DETECTED NOT DETECTED Final   Candida albicans NOT DETECTED NOT DETECTED Final   Candida glabrata NOT DETECTED NOT DETECTED Final   Candida krusei NOT DETECTED NOT DETECTED Final   Candida parapsilosis NOT DETECTED NOT DETECTED Final   Candida tropicalis NOT DETECTED NOT DETECTED Final    Comment: Performed at [facility], [place]. [address] [place] [address] [place], [place] [address]    Blood Culture (routine x 2)     Status: Abnormal (Preliminary result)   Collection Time: 01/11/18 12:20 AM  Result Value Ref Range Status   Specimen Description BLOOD RIGHT FOREARM  Final   Special Requests   Final    BOTTLES DRAWN AEROBIC AND ANAEROBIC Blood Culture adequate volume   Culture  Setup Time   Final    GRAM POSITIVE RODS ANAEROBIC BOTTLE ONLY CRITICAL RESULT CALLED TO, READ BACK BY AND VERIFIED WITH: E SINCLAIR,PHARMD AT 1523  01/11/18 BY L BENFIELD GRAM POSITIVE COCCI AEROBIC BOTTLE ONLY CRITICAL VALUE NOTED.  VALUE IS CONSISTENT WITH PREVIOUSLY REPORTED AND CALLED VALUE. Performed at [facility], [place] [address], [place], [place] [address]    Culture (A)  Final    ENTEROCOCCUS FAECALIS STAPHYLOCOCCUS HAEMOLYTICUS THE SIGNIFICANCE OF ISOLATING THIS ORGANISM FROM A SINGLE SET OF BLOOD CULTURES WHEN MULTIPLE SETS ARE DRAWN IS UNCERTAIN. PLEASE NOTIFY THE MICROBIOLOGY DEPARTMENT WITHIN ONE WEEK IF SPECIATION AND SENSITIVITIES ARE REQUIRED. CLOSTRIDIUM PERFRINGENS SUSCEPTIBILITIES TO FOLLOW FOR  SCHISTOSOMA HAEMATOBIUM    Report Status PENDING  Incomplete   Organism ID, Bacteria ENTEROCOCCUS FAECALIS  Final      Susceptibility   Enterococcus faecalis - MIC*    AMPICILLIN <=2 SENSITIVE Sensitive     VANCOMYCIN 2 SENSITIVE Sensitive     GENTAMICIN SYNERGY SENSITIVE Sensitive     * ENTEROCOCCUS FAECALIS  Blood Culture ID Panel (Reflexed)     Status: None   Collection Time: 01/11/18 12:20 AM  Result Value Ref Range Status   Enterococcus species NOT DETECTED NOT DETECTED Final   Listeria monocytogenes NOT DETECTED NOT DETECTED Final   Staphylococcus species NOT DETECTED NOT DETECTED Final   Staphylococcus aureus NOT DETECTED NOT DETECTED Final   Streptococcus species NOT DETECTED NOT DETECTED Final   Streptococcus agalactiae NOT DETECTED NOT DETECTED Final   Streptococcus pneumoniae NOT DETECTED NOT DETECTED Final   Streptococcus pyogenes NOT DETECTED NOT DETECTED  Final   Acinetobacter baumannii NOT DETECTED NOT DETECTED Final   Enterobacteriaceae species NOT DETECTED NOT DETECTED Final   Enterobacter cloacae complex NOT DETECTED NOT DETECTED Final   Escherichia coli NOT DETECTED NOT DETECTED Final   Klebsiella oxytoca NOT DETECTED NOT DETECTED Final   Klebsiella pneumoniae NOT DETECTED NOT DETECTED Final   Proteus species NOT DETECTED NOT DETECTED Final   Serratia marcescens NOT DETECTED NOT DETECTED Final   Haemophilus influenzae NOT DETECTED NOT DETECTED Final   Neisseria meningitidis NOT DETECTED NOT DETECTED Final   Pseudomonas aeruginosa NOT DETECTED NOT DETECTED Final   Candida albicans NOT DETECTED NOT DETECTED Final   Candida glabrata NOT DETECTED NOT DETECTED Final   Candida krusei NOT DETECTED NOT DETECTED Final   Candida parapsilosis NOT DETECTED NOT DETECTED Final   Candida tropicalis NOT DETECTED NOT DETECTED Final    Comment: Performed at [facility], [place]. [address] [place], [place] [address]  Urine culture     Status: None   Collection Time: 01/11/18  2:28 AM  Result Value Ref Range Status   Specimen Description URINE, CLEAN CATCH  Final   Special Requests NONE  Final   Culture   Final    NO GROWTH Performed at [facility], [place] [address], [place], [place] [address]    Report Status 01/12/2018 FINAL  Final  Culture, blood (routine x 2)     Status: None   Collection Time: 01/12/18  6:40 AM  Result Value Ref Range Status   Specimen Description BLOOD LEFT HAND  Final   Special Requests   Final    BOTTLES DRAWN AEROBIC ONLY Blood Culture adequate volume   Culture   Final    NO GROWTH 5 DAYS Performed at [facility], [place] [address] [place], [place] [address]    Report Status 01/17/2018 FINAL  Final  Culture, blood (routine x 2)     Status: None   Collection Time: 01/12/18  6:49 AM  Result Value Ref Range Status  Specimen Description BLOOD RIGHT HAND  Final   Special Requests   Final    BOTTLES  DRAWN AEROBIC ONLY Blood Culture adequate volume   Culture   Final    NO GROWTH 5 DAYS Performed at [facility], [address] [place], [place] [address]    Report Status 01/17/2018 FINAL  Final  Culture, blood (routine x 2)     Status: None   Collection Time: 01/13/18  2:28 PM  Result Value Ref Range Status   Specimen Description BLOOD LEFT HAND  Final   Special Requests   Final    BOTTLES DRAWN AEROBIC ONLY Blood Culture results may not be optimal due to an inadequate volume of blood received in culture bottles   Culture   Final    NO GROWTH 5 DAYS Performed at [facility], [place] [address] [place], [place] [address]    Report Status 01/18/2018 FINAL  Final  Culture, blood (routine x 2)     Status: None   Collection Time: 01/13/18  2:32 PM  Result Value Ref Range Status   Specimen Description BLOOD RIGHT HAND  Final   Special Requests   Final    BOTTLES DRAWN AEROBIC AND ANAEROBIC Blood Culture adequate volume   Culture   Final    NO GROWTH 5 DAYS Performed at [facility], [place] [address], [place], [place] [address]    Report Status 01/18/2018 FINAL  Final    Studies/Results: Dg Abd Portable 1v  Result Date: 01/18/2018 CLINICAL DATA:  Acute generalized abdominal pain. EXAM: PORTABLE ABDOMEN - 1 VIEW COMPARISON:  Radiograph of August 14, 2017. FINDINGS: No abnormal bowel dilatation is noted. Moderate amount of stool seen in the right colon. No radio-opaque calculi or other significant radiographic abnormality are seen. Status post cholecystectomy. IMPRESSION: Moderate stool burden.  No evidence of bowel obstruction or ileus. Electronically Signed   By: Marijo Conception, M.D.   On: 01/18/2018 10:14      Assessment/Plan:  INTERVAL HISTORY: Patient having some abdominal pain due to constipation appears  Principal Problem:   Bacteremia due to Enterococcus Active Problems:   HCAP (healthcare-associated pneumonia)   Sepsis ([place])   Bacteremia due to  clostridium perfringens    Joseph [name] is a 67 y.o. male with  *severe rheumatoid arthritis and emphysema who has had problems with neutropenia in the context of methotrexate his RA.  He is apparently had 8 hospitalizations for sepsis in the last year.  He is taken care of by a rheumatologist and hematologist at [facility].  He is now been admitted with a polymicrobial bacteremia /- infected bullous area  #1 Polymicrobial bacteremia with AMP S Enterococcus, C perfringens found in 1 of 2 blood cultures he has a Staphylococcus hemolyticus in that same blood culture the other blood culture has 2 different staphylococcal species which we believe are contaminants.  To be improving on IV Unasyn  He has some aortic regurgitation on transthoracic echocardiogram.    I would consider a transesophageal echocardiogram especially with his history of recurrent sepsis.  There is also possibility that his other blood culture might have grown enterococcus had not been contaminated with staphylococcal species  2.  Neutropenia continue to check differentials on his CBCs I know that his physicians at [facility] very much want to get a bone marrow biopsy and attempts of been made to transfer the patient there.  For now continue to support him with [facility] and  to communicate closely with the rheumatologist and hematologist at [facility] [place] [facility]  I do not think that he should need neutropenic precautions if we are keeping neutrophils up but his wife and the patient are very worried about this and asking me to wear a mask and to ensure that there are no fresh fruits and vegetables that out of the room.  I have reviewed the phone notes from his 2 subspecialist at [facility].  Dr. Baxter Flattery will take over the service again tomorrow.   LOS: 7 days   Alcide Evener 01/18/2018, 11:04 AM

## 2018-01-19 LAB — BASIC METABOLIC PANEL
Anion gap: 8 (ref 5–15)
BUN: 15 mg/dL (ref 6–20)
CALCIUM: 8.9 mg/dL (ref 8.9–10.3)
CO2: 27 mmol/L (ref 22–32)
CREATININE: 0.61 mg/dL (ref 0.61–1.24)
Chloride: 102 mmol/L (ref 101–111)
GFR calc Af Amer: >60 mL/min (ref >60)
GLUCOSE: 101 mg/dL — AB (ref 65–99)
POTASSIUM: 3.8 mmol/L (ref 3.5–5.1)
SODIUM: 137 mmol/L (ref 135–145)

## 2018-01-19 LAB — CBC WITH DIFFERENTIAL/PLATELET
ABS IMMATURE GRANULOCYTES: 0 10*3/uL (ref 0.0–0.1)
BASOS ABS: 0 10*3/uL (ref 0.0–0.1)
BASOS PCT: 0 %
EOS ABS: 0.1 10*3/uL (ref 0.0–0.7)
EOS PCT: 2 %
HCT: 35.4 % — ABNORMAL LOW (ref 39.0–52.0)
Hemoglobin: 11.1 g/dL — ABNORMAL LOW (ref 13.0–17.0)
Immature Granulocytes: 1 %
Lymphocytes Relative: 39 %
Lymphs Abs: 1.4 10*3/uL (ref 0.7–4.0)
MCH: 26.6 pg (ref 26.0–34.0)
MCHC: 31.4 g/dL (ref 30.0–36.0)
MCV: 84.7 fL (ref 78.0–100.0)
MONO ABS: 0.6 10*3/uL (ref 0.1–1.0)
MONOS PCT: 16 %
NEUTROS ABS: 1.5 10*3/uL — AB (ref 1.7–7.7)
Neutrophils Relative %: 42 %
PLATELETS: 250 10*3/uL (ref 150–400)
RBC: 4.18 MIL/uL — ABNORMAL LOW (ref 4.22–5.81)
RDW: 17.2 % — AB (ref 11.5–15.5)
WBC: 3.7 10*3/uL — ABNORMAL LOW (ref 4.0–10.5)

## 2018-01-19 LAB — GLUCOSE, CAPILLARY
Glucose-Capillary: 110 mg/dL — ABNORMAL HIGH (ref 65–99)
Glucose-Capillary: 215 mg/dL — ABNORMAL HIGH (ref 65–99)
Glucose-Capillary: 136 mg/dL — ABNORMAL HIGH (ref 65–99)
Glucose-Capillary: 189 mg/dL — ABNORMAL HIGH (ref 65–99)

## 2018-01-19 LAB — CULTURE, BLOOD (ROUTINE X 2): SPECIAL REQUESTS: ADEQUATE

## 2018-01-19 LAB — MAGNESIUM: MAGNESIUM: 2 mg/dL (ref 1.7–2.4)

## 2018-01-19 MED ORDER — POTASSIUM CHLORIDE CRYS ER 20 MEQ PO TBCR
40.0000 meq | EXTENDED_RELEASE_TABLET | Freq: Once | ORAL | Status: AC
  Administered 2018-01-19: 40 meq via ORAL
  Filled 2018-01-19: qty 2

## 2018-01-19 MED ORDER — FUROSEMIDE 40 MG PO TABS
40.0000 mg | ORAL_TABLET | Freq: Every morning | ORAL | Status: DC
  Administered 2018-01-20 – 2018-01-24 (×5): 40 mg via ORAL
  Filled 2018-01-19 (×5): qty 1

## 2018-01-19 MED ORDER — FUROSEMIDE 40 MG PO TABS
20.0000 mg | ORAL_TABLET | Freq: Every evening | ORAL | Status: DC
  Administered 2018-01-19 – 2018-01-23 (×5): 20 mg via ORAL
  Filled 2018-01-19 (×5): qty 1

## 2018-01-19 MED ORDER — FUROSEMIDE 40 MG PO TABS: 20.0000 mg | ORAL_TABLET | ORAL | Status: DC

## 2018-01-19 NOTE — Progress Notes (Signed)
Physical Therapy Treatment Patient Details Name: Joseph [name] MRN: [id number] DOB: October 25, 1950 Today's Date: 01/19/2018    History of Present Illness Pt. is a 67 y.o. M with advanced rheumatoid arthritis on chronic prednisone 15 mg daily, bolus emphysema, chronic hypoxic respiratory failure on home O2, just discharged from this hospital on 5/17 with plans to follow up at [facility] hematology. Patient readmitted on 5/19 with septic shock secondary to polymicrobial bacteremia left upper lobe pneumonia. Briefly managed in the intensive care unit - transferred 5/22.     PT Comments    Continuing work on functional mobility and activity tolerance;  Walked hallway with bil platform RW and one short standing rest break; chair pushed behind for safety, but ultimately unneeded; Mr. Ullman seemed pleased with progress   Follow Up Recommendations  Supervision/Assistance - 24 hour;Home health PT;Supervision for mobility/OOB     Equipment Recommendations  None recommended by PT    Recommendations for Other Services       Precautions / Restrictions Precautions Precautions: Fall    Mobility  Bed Mobility Overal bed mobility: Modified Independent Bed Mobility: Supine to Sit     Supine to sit: Modified independent (Device/Increase time)     General bed mobility comments: slow moving.but no physical assist  Transfers Overall transfer level: Needs assistance Equipment used: Bilateral platform walker Transfers: Sit to/from Stand Sit to Stand: From elevated surface;Min assist         General transfer comment: Bed elevated for transfer. Min assist to power up to standing.   Ambulation/Gait Ambulation/Gait assistance: Min assist(chair push for safety, bu tultimately unneeded) Ambulation Distance (Feet): 300 Feet Assistive device: Bilateral platform walker Gait Pattern/deviations: Step-through pattern;Trunk flexed;Decreased stride length Gait velocity: Decreased    General Gait Details: 1  short standing rest break; overall improving activity tolerance   Stairs             Wheelchair Mobility    Modified Rankin (Stroke Patients Only)       Balance     Sitting balance-Leahy Scale: Good       Standing balance-Leahy Scale: Poor Standing balance comment: reliant on external support                            Cognition Arousal/Alertness: Awake/alert Behavior During Therapy: WFL for tasks assessed/performed Overall Cognitive Status: Within Functional Limits for tasks assessed                                        Exercises      General Comments        Pertinent Vitals/Pain Pain Assessment: Faces Faces Pain Scale: Hurts little more Pain Location: chronic back pain; relieved with movement; reports improved after ambulation and sitting in chair Pain Descriptors / Indicators: Aching Pain Intervention(s): Monitored during session;Premedicated before session    Home Living                      Prior Function            PT Goals (current goals can now be found in the care plan section) Acute Rehab PT Goals Patient Stated Goal: to get back to exercising PT Goal Formulation: With patient Time For Goal Achievement: 01/30/18 Potential to Achieve Goals: Good Progress towards PT goals: Progressing toward goals    Frequency  Min 3X/week      PT Plan Current plan remains appropriate    Co-evaluation              AM-PAC PT "6 Clicks" Daily Activity  Outcome Measure  Difficulty turning over in bed (including adjusting bedclothes, sheets and blankets)?: None Difficulty moving from lying on back to sitting on the side of the bed? : A Little Difficulty sitting down on and standing up from a chair with arms (e.g., wheelchair, bedside commode, etc,.)?: A Lot Help needed moving to and from a bed to chair (including a wheelchair)?: A Little Help needed walking in hospital room?: A Little Help needed  climbing 3-5 steps with a railing? : A Lot 6 Click Score: 17    End of Session Equipment Utilized During Treatment: Oxygen;Gait belt Activity Tolerance: Patient tolerated treatment well Patient left: in chair;with call bell/phone within reach;with family/visitor present Nurse Communication: Mobility status PT Visit Diagnosis: Unsteadiness on feet (R26.81);Other abnormalities of gait and mobility (R26.89);Muscle weakness (generalized) (M62.81)     Time: 3536-1443 PT Time Calculation (min) (ACUTE ONLY): 26 min  Charges:  $Gait Training: 23-37 mins                    G Codes:       Van Clines, [place]  Acute Rehabilitation Services Pager [phone] Office [phone]'   Levi Aland 01/19/2018, 12:37 PM

## 2018-01-19 NOTE — Progress Notes (Signed)
[facility]    Date of Admission:  01/11/2018   Total days of antibiotics 10        Day 5 amp/sub           ID: Joseph [name] is a 67 y.o. male  with  rheumatoid arthritis on chronic prednisone 15 mg daily, bullous emphysema, chronic hypoxic respiratory failure on home O2, idiopathic neutropenia with numerous hospitalizations related to sepsis-in fact just discharged on 5/17 after being treated for SIRS, neutropenia was readmitted on 5/19 with septic shock secondary to polymicrobial bacteremia ( enterococcus, Staphylococcus hemolyticus and Clostridium perfringens) and worsening left upper lobe pneumonia- fluid filled bullae.   Principal Problem:   Bacteremia due to Enterococcus Active Problems:   HCAP (healthcare-associated pneumonia)   Sepsis (HCC)   Bacteremia due to clostridium perfringens    Subjective: Improving constipation. Had 2 large BM yesterday, one this morning. Less abdominal pain. Feeling better. Continues to try to ambulate with assistance  Medications:  . apixaban  5 mg Oral BID  . arformoterol  15 mcg Nebulization BID  . aspirin  81 mg Oral Daily  . bisacodyl  10 mg Rectal Daily  . budesonide (PULMICORT) nebulizer solution  0.5 mg Nebulization BID  . dextromethorphan-guaiFENesin  1 tablet Oral BID  . folic acid  1 mg Oral Daily  . insulin aspart  0-15 Units Subcutaneous TID WC  . insulin aspart  0-5 Units Subcutaneous QHS  . ipratropium-albuterol  3 mL Nebulization BID  . mouth rinse  15 mL Mouth Rinse BID  . metoprolol tartrate  50 mg Oral BID  . pantoprazole  40 mg Oral BID  . polyethylene glycol  17 g Oral BID  . potassium chloride  40 mEq Oral Once  . predniSONE  15 mg Oral Q breakfast  . pregabalin  75 mg Oral BID  . traZODone  25 mg Oral QHS  . vitamin C  500 mg Oral Daily    Objective: Vital signs in last 24 hours: Temp:  [97.8 F (36.6 C)-98.9 F (37.2 C)] 98.9 F (37.2 C) (05/27 0840) Pulse Rate:  [63-76] 67  (05/27 0840) Resp:  [12-20] 20 (05/27 0840) BP: (109-128)/(63-75) 128/72 (05/27 0840) SpO2:  [98 %-99 %] 98 % (05/27 0840) Physical Exam  Constitutional: He is oriented to person, place, and time. He appears well-developed and well-nourished. No distress.  HENT:  Mouth/Throat: Oropharynx is clear and moist. No oropharyngeal exudate.  Cardiovascular: Normal rate, regular rhythm and normal heart sounds. Exam reveals no gallop and no friction rub.  No murmur heard.  Pulmonary/Chest: Effort normal and breath sounds normal. No respiratory distress. He has no wheezes.  Abdominal: Soft. Bowel sounds are normal. He exhibits no distension. There is no tenderness.  Ext: deformation to hands c/w RA Skin: Scattered echymosis Psychiatric: He has a normal mood and affect. His behavior is normal.     Lab Results Recent Labs    01/18/18 0553 01/19/18 0451  WBC 5.1 3.7*  HGB 11.3* 11.1*  HCT 35.7* 35.4*  NA 137 137  K 3.9 3.8  CL 99* 102  CO2 28 27  BUN 13 15  CREATININE 0.64 0.61    Microbiology: 5/21 blood cx 2 NGTD 5/20 blood cx x 2 NGTD  5/19 blood cx 1 set -ENTEROCOCCUS FAECALIS  STAPHYLOCOCCUS HAEMOLYTICUS  CLOSTRIDIUM PERFRINGENS  5/19 blood cx x 1 set-STAPHYLOCOCCUS EPIDERMIDIS  STAPHYLOCOCCUS SIMULANS  Studies/Results: Dg Abd Portable 1v  Result Date: 01/18/2018 CLINICAL DATA:  Acute generalized abdominal pain. EXAM: PORTABLE ABDOMEN - 1 VIEW COMPARISON:  Radiograph of August 14, 2017. FINDINGS: No abnormal bowel dilatation is noted. Moderate amount of stool seen in the right colon. No radio-opaque calculi or other significant radiographic abnormality are seen. Status post cholecystectomy. IMPRESSION: Moderate stool burden.  No evidence of bowel obstruction or ileus. Electronically Signed   By: Lupita Raider, M.D.   On: 01/18/2018 10:14     Assessment/Plan: Polymicrobial bacteremia (including enterococcus/clostridium sp)  in setting of sepsis -GI source = continue on  amp/sub. Appears to be improving. No longer having fevers, and blood cx cleared  28hrs after 1st set collected. Had TTE. Did not pursue TEE since appears quick resolution of symptoms. Plan for 2 wks of treatment. Currently on day 10 of 14. Continue on amp.sub while hospitalized.  Neutropenia = etiology is unclear. He is slated to get BM aspirate and evaluation through [facility] hem/onc, patient on transfer list. His WBC trending down since receiving filgrastim. Today's ANC 1.5.  Constipation = getting miralax to help with symptomatic constipation.  [facility] Cell: [phone] Pager: [phone]  01/19/2018, 12:05 PM

## 2018-01-19 NOTE — Progress Notes (Signed)
PROGRESS NOTE        PATIENT DETAILS Name: Joseph [name] Age: 67 y.o. Sex: male Date of Birth: September 03, 1950 Admit Date: 01/11/2018 Admitting Physician Reyne Dumas, MD [id number], Early Chars, MD  Brief Narrative: Patient is a 67 y.o. 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  Briefly managed in the intensive care unit-upon stability, transfered to the triad hospitalist service on 5/22.  Further hospital course has been complicated by development of A. fib with RVR. Awaiting transfer to [facility] when bed available  Subjective: Patient in bed, appears comfortable, denies any headache, no fever, no chest pain or pressure, no shortness of breath , no abdominal pain. No focal weakness.   Assessment/Plan:  Septic shock secondary to polymicrobial bloodstream infection (enterococcus, CoNS and Clostridium perfringens) and left upper lobe pneumonia: Sepsis pathophysiology has resolved, he is clinically improved, infectious disease following with recommendations to complete 8 additional days of IV Unasyn from 8/24.  Upon his request he has been transferred to [facility] bed is awaited.  Also ID recommended to consider TEE due to recurrent bout of infections and bacteremia, TEE was requested on 01/19/2018 through cardiology.  Neutropenia: Has had issues with recurrent neutropenia-as noted above, plans were to follow-up with his hematologist at [facility] for a  bone marrow biopsy.  Unfortunately has had numerous recent hospitalizations for infection/sepsis in the past 6 months.  Neutropenia has improved-patient received 1 dose of Granix on 5/21.  Unlikely to have Felty syndrome as he does not have any evidence of splenomegaly. Dr. Sherral Hammers discussed with patient's hematologist on 5/23-regarding timing of bone marrow biopsy.  He is awaiting bed at [facility] spoken with the transfer center at [facility] on 5/24-he was unlikely to have a bed over the weekend-likely to be transferred early next week.  Chronic A. fib with RVR, [place] vas 2 score of at least 3: Occurred briefly while in the intensive care unit on 5/19-maintained sinus rhythm since then-only to revert back to A. fib with RVR earlier this morning.  Underwent ablation in 2018 by Dr. Lovena Le for atrial flutter.  Previous MD had spoken with EP service over the phone on 5/24-plans were to initiate anticoagulation and rate control agents if A. fib reoccurred-since it has-have started metoprolol and Eliquis.  Previous MD also discussed the case briefly with Dr. Debara Pickett over the phone on 5/25 as well.  If rate is not well controlled with metoprolol-may need Cardizem infusion.  At this time he seems to be in rate controlled with few PVCs continue beta-blocker and Eliquis combination.  Recent echo shows EF of 60%.  Electrolytes stable.  Continue present line of care.  Chronic hypoxic respiratory failure/COPD: Stable without any evidence of exacerbation.  At baseline he uses 3 L nasal cannula oxygen which will be continued.  CAD status post CABG 2007: no anginal symptoms-remains on aspirin.  Chronic diastolic heart failure (EF 60-65% by TTE on 01/12/2018): Remains compensated.  Rheumatoid arthritis: Continue prednisone daily.  Diarrhea: Has resolved-thought to have polymicrobial bloodstream infection due to possible bacterial translocation from diarrhea.  Recent C. difficile PCR on 5/4 was  negative.  Hyperlipidemia: Continue statin  Depression/insomnia: Appears stable-continue trazodone.  Mild constipation with generalized abdominal ache.  KUB noted.  Placed on bowel regimen and now resolved.  DM-2: CBGs remain stable with SSI.   CBG (last 3)  Recent Labs    01/18/18 1638 01/18/18 2046 01/19/18 0842  GLUCAP 197* 146* 110*     Telemetry (independently reviewed): A. fib with RVR  DVT Prophylaxis:   Code Status: Full code  Family Communication: Spouse at bedside  Disposition Plan: Remain inpatient-transfer to [facility] when bed available, discussed with [facility] on 01/18/2018 he is still on the list but they do not have any beds yet.  Antimicrobial agents: Anti-infectives (From admission, onward)   Start     Dose/Rate Route Frequency Ordered Stop   01/17/18 0000  Ampicillin-Sulbactam 3 g in sodium chloride 0.9 % 100 mL     3 g 200 mL/hr over 30 Minutes Intravenous Every 6 hours 01/17/18 1744     01/15/18 1430  Ampicillin-Sulbactam (UNASYN) 3 g in sodium chloride 0.9 % 100 mL IVPB  Status:  Discontinued     3 g 200 mL/hr over 30 Minutes Intravenous Every 6 hours 01/15/18 1405 01/15/18 1421   01/15/18 1430  Ampicillin-Sulbactam (UNASYN) 3 g in sodium chloride 0.9 % 100 mL IVPB     3 g 200 mL/hr over 30 Minutes Intravenous Every 6 hours 01/15/18 1421 01/23/18 1429   01/13/18 2255  vancomycin (VANCOCIN) 1,250 mg in sodium chloride 0.9 % 250 mL IVPB  Status:  Discontinued     1,250 mg 166.7 mL/hr over 90 Minutes Intravenous Every 8 hours 01/13/18 1512 01/15/18 1405   01/12/18 0800  piperacillin-tazobactam (ZOSYN) IVPB 3.375 g  Status:  Discontinued     3.375 g 12.5 mL/hr over 240 Minutes Intravenous Every 8 hours 01/11/18 0040 01/13/18 1218   01/11/18 1400  vancomycin (VANCOCIN) 1,250 mg in sodium chloride 0.9 % 250 mL IVPB  Status:  Discontinued     1,250 mg 166.7 mL/hr over 90 Minutes Intravenous Every 12 hours 01/11/18 0046 01/13/18 1512   01/11/18  0045  vancomycin (VANCOCIN) IVPB 750 mg/150 ml premix     750 mg 150 mL/hr over 60 Minutes Intravenous  Once 01/11/18 0040 01/11/18 0218   01/11/18 0030  piperacillin-tazobactam (ZOSYN) IVPB 3.375 g     3.375 g 100 mL/hr over 30 Minutes Intravenous  Once 01/11/18 0016 01/11/18 0105   01/11/18 0030  vancomycin (VANCOCIN) IVPB 1000 mg/200 mL premix     1,000 mg 200 mL/hr over 60 Minutes Intravenous  Once 01/11/18 0016 01/11/18 0146      Procedures: None  CONSULTS:  pulmonary/intensive care and ID  Time spent: 25 minutes-Greater than 50% of this time was spent in counseling, explanation of diagnosis, planning of further management, and coordination of care.  MEDICATIONS: Scheduled Meds: . apixaban  5 mg Oral BID  . arformoterol  15 mcg Nebulization BID  . aspirin  81 mg Oral Daily  . bisacodyl  10 mg Rectal Daily  . budesonide (PULMICORT) nebulizer solution  0.5 mg Nebulization BID  . dextromethorphan-guaiFENesin  1 tablet Oral BID  . folic acid  1 mg Oral Daily  . insulin aspart  0-15 Units Subcutaneous TID WC  . insulin aspart  0-5 Units Subcutaneous QHS  . ipratropium-albuterol  3 mL Nebulization BID  . mouth rinse  15 mL Mouth Rinse BID  . metoprolol tartrate  50 mg Oral BID  . pantoprazole  40 mg Oral BID  . polyethylene glycol  17 g Oral BID  . predniSONE  15 mg Oral Q breakfast  . pregabalin  75 mg Oral BID  . traZODone  25 mg Oral QHS  . vitamin C  500 mg Oral Daily   Continuous Infusions: . ampicillin-sulbactam (UNASYN) IV 3 g (01/19/18 0843)   PRN Meds:.Melatonin, metoprolol tartrate, nitroGLYCERIN, ondansetron (ZOFRAN) IV, oxyCODONE-acetaminophen   PHYSICAL EXAM: Vital signs: Vitals:   01/18/18 2046 01/18/18 2053 01/19/18 0456 01/19/18 0840  BP: 117/75  123/73 128/72  Pulse: 76  63 67  Resp: '16  16 20  ' Temp: 97.8 F (36.6 C)  98.4 F (36.9 C) 98.9 F (37.2 C)  TempSrc: Oral  Oral Oral  SpO2: 98% 98% 99% 98%  Weight:      Height:       Filed  Weights   01/16/18 2055 01/17/18 2044 01/18/18 0500  Weight: 90.6 kg (199 lb 11.8 oz) 90.6 kg (199 lb 11.8 oz) 90.6 kg (199 lb 11.8 oz)   Body mass index is 25.64 kg/m.   Exam  Awake Alert, Oriented X 3, No new F.N deficits, Normal affect .AT,PERRAL Supple Neck,No JVD, No cervical lymphadenopathy appriciated.  Symmetrical Chest wall movement, Good air movement bilaterally, CTAB RRR,No Gallops, Rubs or new Murmurs, No Parasternal Heave +ve B.Sounds, Abd Soft, No tenderness, No organomegaly appriciated, No rebound - guarding or rigidity. No Cyanosis, Clubbing or edema, No new Rash or bruise   I have personally reviewed following labs and imaging studies  LABORATORY DATA: CBC: Recent Labs  Lab 01/14/18 0240 01/15/18 0305 01/15/18 1537 01/16/18 0616 01/17/18 0415 01/18/18 0553 01/19/18 0451  WBC 5.8 10.9*  --  8.3 6.3 5.1 3.7*  NEUTROABS 4.5  --  10.2*  --  3.9 2.9 1.5*  HGB 8.7* 9.7*  --  10.8* 11.2* 11.3* 11.1*  HCT 27.6* 30.2*  --  33.5* 35.4* 35.7* 35.4*  MCV 84.7 84.6  --  85.5 85.9 86.0 84.7  PLT 107* 141*  --  168 211 205 035    Basic Metabolic Panel: Recent Labs  Lab 01/15/18 0305 01/15/18 1537 01/16/18 0616 01/17/18 0415 01/18/18 0553 01/19/18 0451  NA 141  --  138 138 137 137  K 3.4* 4.0 3.8 3.8 3.9 3.8  CL 105  --  103 101 99* 102  CO2 26  --  '26 26 28 27  ' GLUCOSE 122*  --  100* 106* 98 101*  BUN 8  --  '10 14 13 15  ' CREATININE 0.47*  --  0.53* 0.54* 0.64 0.61  CALCIUM 8.5*  --  8.9 9.3 9.0 8.9  MG 1.8 2.0 1.8 1.8 1.8 2.0    GFR: Estimated Creatinine Clearance: 107 mL/min (by C-G formula based on SCr of 0.61 mg/dL).  Liver Function Tests: No results for input(s): AST, ALT, ALKPHOS, BILITOT, PROT, ALBUMIN in the last 168 hours. No results for input(s): LIPASE, AMYLASE in the last 168 hours. No results for input(s): AMMONIA in the last 168 hours.  Coagulation Profile: No results for input(s): INR, PROTIME in the last 168 hours.  Cardiac  Enzymes: Recent Labs  Lab 01/12/18 2214 01/13/18 0355 01/13/18 0958  TROPONINI 0.07* 0.07* 0.07*    BNP (last 3 results) No results for input(s): PROBNP in the last 8760 hours.  HbA1C: No results  for input(s): HGBA1C in the last 72 hours.  CBG: Recent Labs  Lab 01/18/18 0726 01/18/18 1131 01/18/18 1638 01/18/18 2046 01/19/18 0842  GLUCAP 90 145* 197* 146* 110*    Lipid Profile: No results for input(s): CHOL, HDL, LDLCALC, TRIG, CHOLHDL, LDLDIRECT in the last 72 hours.  Thyroid Function Tests: No results for input(s): TSH, T4TOTAL, FREET4, T3FREE, THYROIDAB in the last 72 hours.  Anemia Panel: No results for input(s): VITAMINB12, FOLATE, FERRITIN, TIBC, IRON, RETICCTPCT in the last 72 hours.  Urine analysis:    Component Value Date/Time   COLORURINE YELLOW 01/11/2018 0225   APPEARANCEUR CLEAR 01/11/2018 0225   LABSPEC 1.013 01/11/2018 0225   PHURINE 5.0 01/11/2018 0225   GLUCOSEU NEGATIVE 01/11/2018 0225   HGBUR NEGATIVE 01/11/2018 0225   HGBUR negative 04/12/2008 1103   BILIRUBINUR NEGATIVE 01/11/2018 0225   KETONESUR NEGATIVE 01/11/2018 0225   PROTEINUR NEGATIVE 01/11/2018 0225   UROBILINOGEN 0.2 10/17/2012 1236   NITRITE NEGATIVE 01/11/2018 0225   LEUKOCYTESUR NEGATIVE 01/11/2018 0225    Sepsis Labs: Lactic Acid, Venous    Component Value Date/Time   LATICACIDVEN 2.4 ([place]) 01/11/2018 2234    MICROBIOLOGY: Recent Results (from the past 240 hour(s))  Blood Culture (routine x 2)     Status: Abnormal   Collection Time: 01/11/18 12:15 AM  Result Value Ref Range Status   Specimen Description BLOOD LEFT ANTECUBITAL  Final   Special Requests   Final    BOTTLES DRAWN AEROBIC AND ANAEROBIC Blood Culture adequate volume   Culture  Setup Time   Final    GRAM POSITIVE COCCI IN BOTH AEROBIC AND ANAEROBIC BOTTLES CRITICAL RESULT CALLED TO, READ BACK BY AND VERIFIED WITH: ESINCLAIR,PHARMD '@1900'  01/11/18 BY LHOWARD    Culture (A)  Final    STAPHYLOCOCCUS  EPIDERMIDIS STAPHYLOCOCCUS SIMULANS THE SIGNIFICANCE OF ISOLATING THIS ORGANISM FROM A SINGLE SET OF BLOOD CULTURES WHEN MULTIPLE SETS ARE DRAWN IS UNCERTAIN. PLEASE NOTIFY THE MICROBIOLOGY DEPARTMENT WITHIN ONE WEEK IF SPECIATION AND SENSITIVITIES ARE REQUIRED. Performed at [facility], [place] [address] [place], [place] [address]    Report Status 01/16/2018 FINAL  Final  Blood Culture ID Panel (Reflexed)     Status: Abnormal   Collection Time: 01/11/18 12:15 AM  Result Value Ref Range Status   Enterococcus species NOT DETECTED NOT DETECTED Final   Listeria monocytogenes NOT DETECTED NOT DETECTED Final   Staphylococcus species DETECTED (A) NOT DETECTED Final    Comment: Methicillin (oxacillin) resistant coagulase negative staphylococcus. Possible blood culture contaminant (unless isolated from more than one blood culture draw or clinical case suggests pathogenicity). No antibiotic treatment is indicated for blood  culture contaminants. CRITICAL RESULT CALLED TO, READ BACK BY AND VERIFIED WITH: ESINCLAIR,PHARMD '@1900'  01/11/18 BY LHOWARD    Staphylococcus aureus NOT DETECTED NOT DETECTED Final   Methicillin resistance DETECTED (A) NOT DETECTED Final    Comment: CRITICAL RESULT CALLED TO, READ BACK BY AND VERIFIED WITH: ESINCLAIR,PHARMD '@1900'  01/11/18 BY LHOWARD    Streptococcus species NOT DETECTED NOT DETECTED Final   Streptococcus agalactiae NOT DETECTED NOT DETECTED Final   Streptococcus pneumoniae NOT DETECTED NOT DETECTED Final   Streptococcus pyogenes NOT DETECTED NOT DETECTED Final   Acinetobacter baumannii NOT DETECTED NOT DETECTED Final   Enterobacteriaceae species NOT DETECTED NOT DETECTED Final   Enterobacter cloacae complex NOT DETECTED NOT DETECTED Final   Escherichia coli NOT DETECTED NOT DETECTED Final   Klebsiella oxytoca NOT DETECTED NOT DETECTED Final   Klebsiella pneumoniae NOT DETECTED NOT DETECTED  Final   Proteus species NOT DETECTED NOT DETECTED Final    Serratia marcescens NOT DETECTED NOT DETECTED Final   Haemophilus influenzae NOT DETECTED NOT DETECTED Final   Neisseria meningitidis NOT DETECTED NOT DETECTED Final   Pseudomonas aeruginosa NOT DETECTED NOT DETECTED Final   Candida albicans NOT DETECTED NOT DETECTED Final   Candida glabrata NOT DETECTED NOT DETECTED Final   Candida krusei NOT DETECTED NOT DETECTED Final   Candida parapsilosis NOT DETECTED NOT DETECTED Final   Candida tropicalis NOT DETECTED NOT DETECTED Final    Comment: Performed at [facility], [place] [address], [place], [place] [address]  Blood Culture (routine x 2)     Status: Abnormal   Collection Time: 01/11/18 12:20 AM  Result Value Ref Range Status   Specimen Description BLOOD RIGHT FOREARM  Final   Special Requests   Final    BOTTLES DRAWN AEROBIC AND ANAEROBIC Blood Culture adequate volume   Culture  Setup Time   Final    GRAM POSITIVE RODS ANAEROBIC BOTTLE ONLY CRITICAL RESULT CALLED TO, READ BACK BY AND VERIFIED WITH: E SINCLAIR,PHARMD AT 0938 01/11/18 BY L BENFIELD GRAM POSITIVE COCCI AEROBIC BOTTLE ONLY CRITICAL VALUE NOTED.  VALUE IS CONSISTENT WITH PREVIOUSLY REPORTED AND CALLED VALUE. Performed at [facility], [place] [address] [place], [place] [address]    Culture (A)  Final    ENTEROCOCCUS FAECALIS STAPHYLOCOCCUS HAEMOLYTICUS CLOSTRIDIUM PERFRINGENS    Report Status 01/19/2018 FINAL  Final   Organism ID, Bacteria ENTEROCOCCUS FAECALIS  Final   Organism ID, Bacteria STAPHYLOCOCCUS HAEMOLYTICUS  Final      Susceptibility   Enterococcus faecalis - MIC*    AMPICILLIN <=2 SENSITIVE Sensitive     VANCOMYCIN 2 SENSITIVE Sensitive     GENTAMICIN SYNERGY SENSITIVE Sensitive     * ENTEROCOCCUS FAECALIS   Staphylococcus haemolyticus - MIC*    CIPROFLOXACIN >=8 RESISTANT Resistant     ERYTHROMYCIN >=8 RESISTANT Resistant     GENTAMICIN 8 INTERMEDIATE Intermediate     OXACILLIN >=4 RESISTANT Resistant     TETRACYCLINE >=16 RESISTANT  Resistant     VANCOMYCIN 1 SENSITIVE Sensitive     TRIMETH/SULFA >=320 RESISTANT Resistant     CLINDAMYCIN <=0.25 SENSITIVE Sensitive     RIFAMPIN <=0.5 SENSITIVE Sensitive     Inducible Clindamycin NEGATIVE Sensitive     * STAPHYLOCOCCUS HAEMOLYTICUS  Blood Culture ID Panel (Reflexed)     Status: None   Collection Time: 01/11/18 12:20 AM  Result Value Ref Range Status   Enterococcus species NOT DETECTED NOT DETECTED Final   Listeria monocytogenes NOT DETECTED NOT DETECTED Final   Staphylococcus species NOT DETECTED NOT DETECTED Final   Staphylococcus aureus NOT DETECTED NOT DETECTED Final   Streptococcus species NOT DETECTED NOT DETECTED Final   Streptococcus agalactiae NOT DETECTED NOT DETECTED Final   Streptococcus pneumoniae NOT DETECTED NOT DETECTED Final   Streptococcus pyogenes NOT DETECTED NOT DETECTED Final   Acinetobacter baumannii NOT DETECTED NOT DETECTED Final   Enterobacteriaceae species NOT DETECTED NOT DETECTED Final   Enterobacter cloacae complex NOT DETECTED NOT DETECTED Final   Escherichia coli NOT DETECTED NOT DETECTED Final   Klebsiella oxytoca NOT DETECTED NOT DETECTED Final   Klebsiella pneumoniae NOT DETECTED NOT DETECTED Final   Proteus species NOT DETECTED NOT DETECTED Final   Serratia marcescens NOT DETECTED NOT DETECTED Final   Haemophilus influenzae NOT DETECTED NOT DETECTED Final   Neisseria meningitidis NOT DETECTED NOT DETECTED Final   Pseudomonas aeruginosa  NOT DETECTED NOT DETECTED Final   Candida albicans NOT DETECTED NOT DETECTED Final   Candida glabrata NOT DETECTED NOT DETECTED Final   Candida krusei NOT DETECTED NOT DETECTED Final   Candida parapsilosis NOT DETECTED NOT DETECTED Final   Candida tropicalis NOT DETECTED NOT DETECTED Final    Comment: Performed at [facility], [place] [address], [place], [place] [address]  Urine culture     Status: None   Collection Time: 01/11/18  2:28 AM  Result Value Ref Range Status   Specimen  Description URINE, CLEAN CATCH  Final   Special Requests NONE  Final   Culture   Final    NO GROWTH Performed at [facility], [place] [address], [place], [place] [address]    Report Status 01/12/2018 FINAL  Final  Culture, blood (routine x 2)     Status: None   Collection Time: 01/12/18  6:40 AM  Result Value Ref Range Status   Specimen Description BLOOD LEFT HAND  Final   Special Requests   Final    BOTTLES DRAWN AEROBIC ONLY Blood Culture adequate volume   Culture   Final    NO GROWTH 5 DAYS Performed at [facility], [place] [address], [place], [place] [address]    Report Status 01/17/2018 FINAL  Final  Culture, blood (routine x 2)     Status: None   Collection Time: 01/12/18  6:49 AM  Result Value Ref Range Status   Specimen Description BLOOD RIGHT HAND  Final   Special Requests   Final    BOTTLES DRAWN AEROBIC ONLY Blood Culture adequate volume   Culture   Final    NO GROWTH 5 DAYS Performed at [facility], [place] [address], [place], [place] [address]    Report Status 01/17/2018 FINAL  Final  Culture, blood (routine x 2)     Status: None   Collection Time: 01/13/18  2:28 PM  Result Value Ref Range Status   Specimen Description BLOOD LEFT HAND  Final   Special Requests   Final    BOTTLES DRAWN AEROBIC ONLY Blood Culture results may not be optimal due to an inadequate volume of blood received in culture bottles   Culture   Final    NO GROWTH 5 DAYS Performed at [facility], [place] [address], [place], [place] [address]    Report Status 01/18/2018 FINAL  Final  Culture, blood (routine x 2)     Status: None   Collection Time: 01/13/18  2:32 PM  Result Value Ref Range Status   Specimen Description BLOOD RIGHT HAND  Final   Special Requests   Final    BOTTLES DRAWN AEROBIC AND ANAEROBIC Blood Culture adequate volume   Culture   Final    NO GROWTH 5 DAYS Performed at [facility], [place] [address], [place], [place] [address]    Report Status  01/18/2018 FINAL  Final    RADIOLOGY STUDIES/RESULTS: Dg Chest 2 View  Result Date: 01/02/2018 CLINICAL DATA:  Dyspnea. Re-evaluate left upper lobe cavitary lesion. EXAM: CHEST - 2 VIEW COMPARISON:  12/27/2017. FINDINGS: Stable enlarged cardiac silhouette and post CABG changes. The previously seen 7.6 cm left upper lobe cavitary lesion again measures 7.6 cm in maximum diameter. The this has interval increased density. Adjacent patchy opacity is unchanged. Right upper lobe bullous changes again demonstrated. The interstitial markings remain prominent. Diffuse osteopenia. IMPRESSION: 1. Interval fluid accumulation within the previously demonstrated left  upper lobe cavitary lesion. 2. Stable mild adjacent patchy pneumonia or atelectasis. 3. Stable cardiomegaly, chronic interstitial lung disease and right upper lobe bullous changes. Electronically Signed   By: Claudie Revering M.D.   On: 01/02/2018 12:55   Dg Chest 2 View  Result Date: 12/22/2017 CLINICAL DATA:  Rheumatoid flare up with fever for 2 days. Ex-smoker. EXAM: CHEST - 2 VIEW COMPARISON:  Radiographs 10/27/2017 and 11/08/2017.  CT 08/14/2017. FINDINGS: The heart size and mediastinal contours are stable status post median sternotomy and CABG. There is aortic atherosclerosis. Diffuse central airway thickening is present. There are multiple upper lobe bulla. Dominant left upper lobe lesion demonstrates interval increased wall thickening which may be secondary to superimposed infection or hemorrhage. There is no focal airspace disease, pleural effusion or pneumothorax. No acute osseous findings are evident. IMPRESSION: Interval increased wall thickening of dominant left upper lobe bulla, possibly due to super infection or hemorrhage. Diffuse central airway thickening. No focal airspace disease. Electronically Signed   By: Richardean Sale M.D.   On: 12/22/2017 10:50   Ct Chest W Contrast  Result Date: 01/11/2018 CLINICAL DATA:  Acute onset of generalized  weakness and fever. Decreased oral intake. EXAM: CT CHEST WITH CONTRAST TECHNIQUE: Multidetector CT imaging of the chest was performed during intravenous contrast administration. CONTRAST:  171m OMNIPAQUE IOHEXOL 300 MG/ML  SOLN COMPARISON:  Chest radiograph performed earlier today at 12:22 a.m., and CT of the chest performed 12/22/2017 FINDINGS: Cardiovascular: The heart is normal in size. Diffuse coronary artery calcifications are seen. Scattered calcification noted along the thoracic aorta and proximal great vessels. The descending thoracic aorta is borderline normal in caliber. Mediastinum/Nodes: The patient is status post median sternotomy. Visualized mediastinal nodes remain borderline normal in size. No pericardial effusion is identified. The thyroid gland is unremarkable. No axillary lymphadenopathy is seen. 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 Peripheral honeycombing is noted bilaterally. Mild interstitial prominence may reflect mild interstitial edema. No definite pleural effusion or pneumothorax is seen. Upper Abdomen: The visualized portions of the liver and spleen are unremarkable. The patient is status post cholecystectomy, with a clip noted at the gallbladder fossa. The visualized portions of the spleen are unremarkable. A tiny hiatal hernia is noted. Musculoskeletal: No acute osseous abnormalities are identified. The visualized musculature is unremarkable in appearance. IMPRESSION: 1. Multiple cavitations noted at the left lung apex, with new air-fluid levels, concerning for atypical infection superimposed on chronic bullous disease. 2. Mild interstitial prominence may reflect mild interstitial edema. 3. Diffuse coronary artery calcifications seen. 4. Tiny hiatal hernia noted. Electronically Signed   By: JGarald BaldingM.D.   On: 01/11/2018 04:23   Ct  Chest W Contrast  Result Date: 12/22/2017 CLINICAL DATA:  Fever EXAM: CT CHEST WITH CONTRAST TECHNIQUE: Multidetector CT imaging of the chest was performed during intravenous contrast administration. CONTRAST:  84mISOVUE-300 IOPAMIDOL (ISOVUE-300) INJECTION 61% COMPARISON:  Chest radiograph December 22, 2016; chest CT August 14, 2017 FINDINGS: Cardiovascular: There is no thoracic aortic aneurysm or dissection. There is calcification at the origins of the left common carotid and left subclavian arteries. Mild scattered calcification is noted elsewhere the great vessels. There is aortic atherosclerosis. There are foci of native coronary artery calcification. The patient is status post coronary artery bypass grafting. There is no major vessel pulmonary embolus. No pericardial effusion or pericardial thickening is evident. Mediastinum/Nodes: Thyroid appears normal. There are  multiple subcentimeter mediastinal lymph nodes. There is a subcarinal lymph node measuring 1.3 x 1.0 cm. There is a second sub- carinal lymph node measuring 1.2 x 1.0 cm. There is a small hiatal hernia. Lungs/Pleura: There is underlying bullous emphysematous change. Extensive bullous disease is noted in both upper lobes. In comparison with the previous study, there is a bulla in the posterior segment left upper lobe measuring 7.7 x 4.6 cm. The wall of this bulla has become considerably thicker compared to most recent study. There is also thickening along the nearby major fissure with localized consolidation adjacent to the major fissure in the posterior segment of the left upper lobe. There is no other area of consolidation. There are areas of scarring and atelectatic change in the lung bases. There are scattered areas of fibrosis which appear stable. There is a nodular opacity along the right major fissure measuring 9 x 5 mm, stable and likely representing an intrafissural lymph node. Upper Abdomen: Gallbladder is absent. Spleen is incompletely  visualized. Spleen measures 13.8 cm from anterior to posterior dimension which is prominent. There is upper abdominal aortic atherosclerosis. Musculoskeletal: No blastic or lytic bone lesions are evident. Patient is status post median sternotomy. No chest wall lesions are evident. IMPRESSION: 1. 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 These changes are similar compared to prior study. Note, however, that the largest bulla, located in the posterior segment left upper lobe, shows considerable wall thickening, a change from the previous study. There is a nearby area of airspace consolidation in the posterior segment of the left upper lobe adjacent to the major fissure, felt to represent pneumonia. The thickening of the wall of the bulla is most likely due to infection of this bulla. Atypical cystic neoplasm may present in this manner and cannot be entirely excluded at this time. Note that there is no evidence of mycetoma in this bulla or elsewhere. Given this circumstance, would advise repeat CT after treatment for pneumonia to assess for stability or possible resolution of wall thickening in this bulla. Given the possibility of underlying neoplasm, close clinical and imaging surveillance advised in this regard. 2. Mildly prominent sub-carinal lymph nodes of uncertain etiology. These lymph nodes could well be reactive given the parenchymal lung changes. 3. Status post coronary artery bypass grafting. There is aortic and great vessel atherosclerotic calcification as well as calcification in native coronary arteries. 4.  Small hiatal hernia. 5.  Spleen appears enlarged although incompletely visualized. 6.  Gallbladder surgically absent. Aortic Atherosclerosis (ICD10-I70.0) and Emphysema (ICD10-J43.9). Electronically Signed   By: Lowella Grip III M.D.   On: 12/22/2017 13:17   Ct Abdomen Pelvis W Contrast  Result Date: 01/13/2018 CLINICAL DATA:  Painless diarrhea EXAM:  CT ABDOMEN AND PELVIS WITH CONTRAST TECHNIQUE: Multidetector CT imaging of the abdomen and pelvis was performed using the standard protocol following bolus administration of intravenous contrast. CONTRAST:  19m OMNIPAQUE IOHEXOL 300 MG/ML  SOLN COMPARISON:  08/14/2017 FINDINGS: LOWER CHEST: Small left pleural effusion. Emphysema and bibasilar atelectasis. HEPATOBILIARY: Normal hepatic contours and density. No intra- or extrahepatic biliary dilatation. Status post cholecystectomy. PANCREAS: Normal parenchymal contours without ductal dilatation. No peripancreatic fluid collection. SPLEEN: Normal. ADRENALS/URINARY TRACT: --Adrenal glands: Normal. --Right kidney/ureter: Multiple nonobstructing renal stones measuring up 2 7 mm. No hydronephrosis. No ureterolithiasis. --Left kidney/ureter: Tiny non-obstructing 1-2 mm stone at the lower pole. No hydronephrosis. --Urinary bladder: Normal for degree of distention STOMACH/BOWEL: --Stomach/Duodenum: No hiatal hernia or other gastric abnormality. Normal duodenal course. --  Small bowel: No dilatation or inflammation. --Colon: There is a fecal management system in the rectum. There is sigmoid diverticulosis without acute inflammation. The remainder of the colon is normal. --Appendix: Normal. VASCULAR/LYMPHATIC: Atherosclerotic calcification is present within the non-aneurysmal abdominal aorta, without hemodynamically significant stenosis. The portal vein, splenic vein, superior mesenteric vein and IVC are patent. No abdominal or pelvic lymphadenopathy. REPRODUCTIVE: Normal prostate and seminal vesicles. MUSCULOSKELETAL. No bony spinal canal stenosis or focal osseous abnormality. OTHER: None. IMPRESSION: 1. No acute abnormality of the abdomen or pelvis. 2. Small left pleural effusion. 3. Bilateral nonobstructive nephrolithiasis. 4. Aortic Atherosclerosis (ICD10-I70.0) and Emphysema (ICD10-J43.9). Electronically Signed   By: Ulyses Jarred M.D.   On: 01/13/2018 17:57   Dg Chest  Port 1 View  Result Date: 01/11/2018 CLINICAL DATA:  Sepsis, fever and weakness.  Dyspnea x1 day. EXAM: PORTABLE CHEST 1 VIEW COMPARISON:  Chest CT 12/22/2017, CXR 01/02/2018 FINDINGS: Stable cardiomegaly with post CABG change and aortic atherosclerosis. Redemonstration of left apical bullae, the largest is partially filled with fluid and thick-walled compatible with an infected bulla. This is slightly smaller in appearance than on prior more cavitary in appearance than recent comparison chest radiograph. Subsegmental scarring is seen in the right upper lobe and mid lung. Probable trace right effusion. No acute osseous abnormality. IMPRESSION: 1. Cardiomegaly with aortic atherosclerosis and post CABG change. 2. Thick-walled left upper lobe bulla compatible with an infected bulla is redemonstrated though slightly more cavitary in appearance and less opacified than on prior comparison. This would suggest some partial clearing of fluid within. Electronically Signed   By: Ashley Royalty M.D.   On: 01/11/2018 00:46   Dg Chest Port 1 View  Result Date: 12/27/2017 CLINICAL DATA:  Sepsis. EXAM: PORTABLE CHEST 1 VIEW COMPARISON:  Chest radiograph 12/27/2017, chest CT 12/22/2017 FINDINGS: Postsurgical changes from CABG. Cardiomediastinal silhouette is normal. Mediastinal contours appear intact. Emphysema, chronic interstitial lung changes. Previously noted thick-walled cystic structure in the left upper lobe has increased in size no measuring 7.6 cm, with increased surrounding airspace consolidation. Osseous structures are without acute abnormality. Soft tissues are grossly normal. IMPRESSION: Known left upper lobe thick-walled cavitary lesion has increased in size with increased surrounding airspace consolidation. Emphysema with chronic interstitial lung changes. Electronically Signed   By: Fidela Salisbury M.D.   On: 12/27/2017 17:34   Dg Chest Port 1 View  Result Date: 12/24/2017 CLINICAL DATA:  Pneumonia. EXAM:  PORTABLE CHEST 1 VIEW COMPARISON:  CT scan and radiographs of December 22, 2017. FINDINGS: Stable cardiomediastinal silhouette. Status post coronary artery bypass graft. No pneumothorax is noted. Stable scarring is seen throughout both lungs, most prominently seen in right upper lobe. Left upper lobe bulla is again noted with significant wall thickening consistent with infection and surrounding pneumonia. Bony thorax is unremarkable. IMPRESSION: Left upper lobe bulla is again noted with significant wall thickening consistent with infection and surrounding pneumonia as described on prior CT scan. Electronically Signed   By: Marijo Conception, M.D.   On: 12/24/2017 10:00   Dg Abd Portable 1v  Result Date: 01/18/2018 CLINICAL DATA:  Acute generalized abdominal pain. EXAM: PORTABLE ABDOMEN - 1 VIEW COMPARISON:  Radiograph of August 14, 2017. FINDINGS: No abnormal bowel dilatation is noted. Moderate amount of stool seen in the right colon. No radio-opaque calculi or other significant radiographic abnormality are seen. Status post cholecystectomy. IMPRESSION: Moderate stool burden.  No evidence of bowel obstruction or ileus. Electronically Signed   By: Marijo Conception, M.D.  On: 01/18/2018 10:14   Dg Hips Bilat With Pelvis 2v  Result Date: 01/07/2018 CLINICAL DATA:  Pain in both hips for a week EXAM: DG HIP (WITH OR WITHOUT PELVIS) 2V BILAT COMPARISON:  CT 08/14/2017 FINDINGS: Mild SI joint degenerative changes. Pubic symphysis and rami are intact. 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 No fracture or malalignment. Vascular calcifications. IMPRESSION: Mild arthritis of the hips.  No acute osseous abnormality Electronically Signed   By: Donavan Foil M.D.   On: 01/07/2018 20:25     LOS: 8 days   Signature  Lala Lund M.D on 01/19/2018 at 11:56 AM  Between 7am to 7pm - Pager - [phone] ( page via [email or web address], text pages only, please mention full 10 digit call back number).   After 7pm go to [email or web address] - password [email or web address]

## 2018-01-20 LAB — GLUCOSE, CAPILLARY
GLUCOSE-CAPILLARY: 95 mg/dL (ref 65–99)
GLUCOSE-CAPILLARY: 218 mg/dL — AB (ref 65–99)
GLUCOSE-CAPILLARY: 165 mg/dL — AB (ref 65–99)
Glucose-Capillary: 154 mg/dL — ABNORMAL HIGH (ref 65–99)

## 2018-01-20 LAB — MAGNESIUM: Magnesium: 1.8 mg/dL (ref 1.7–2.4)

## 2018-01-20 NOTE — Progress Notes (Signed)
Occupational Therapy Treatment Patient Details Name: Joseph [name] MRN: [id number] DOB: Dec 03, 1950 Today's Date: 01/20/2018    History of present illness Pt. is a 67 y.o. M with advanced rheumatoid arthritis on chronic prednisone 15 mg daily, bolus emphysema, chronic hypoxic respiratory failure on home O2, just discharged from this hospital on 5/17 with plans to follow up at [facility] hematology. Patient readmitted on 5/19 with septic shock secondary to polymicrobial bacteremia left upper lobe pneumonia. Briefly managed in the intensive care unit - transferred 5/22.    OT comments  Pt demonstrating improving independence with ADL and functional mobility today. He was able to demonstrate increased activity tolerance for ADL completing longer duration of standing and ambulating tasks today with min guard assist with bilateral platform RW. He was able to stand for grooming tasks to brush hair with single UE support and min guard assist this session as well. Pt demonstrated ability to don socks and shoes without physical assistance today. He presented with cheerful mood and expressed with that his room had more decorations to assist with feeling less trapped in the hospital. OT will continue to follow while admitted.    Follow Up Recommendations  Home health OT;Supervision/Assistance - 24 hour    Equipment Recommendations  None recommended by OT    Recommendations for Other Services      Precautions / Restrictions Precautions Precautions: Fall Precaution Comments: Pt also prefers to wear mask in hallway due to depressed immune system; he requests caregivers wear masks as well Restrictions Weight Bearing Restrictions: No       Mobility Bed Mobility Overal bed mobility: Modified Independent                Transfers Overall transfer level: Needs assistance Equipment used: Bilateral platform walker Transfers: Sit to/from Stand Sit to Stand: Min guard         General transfer  comment: Min guard assist for safety.     Balance Overall balance assessment: Needs assistance Sitting-balance support: No upper extremity supported Sitting balance-Leahy Scale: Good     Standing balance support: Bilateral upper extremity supported;During functional activity Standing balance-Leahy Scale: Poor Standing balance comment: requires at least single UE support                           ADL either performed or assessed with clinical judgement   ADL Overall ADL's : Needs assistance/impaired     Grooming: Min guard;Standing;Brushing hair Grooming Details (indicate cue type and reason): Decreased tolerance for static standing. Requires single UE support.              Lower Body Dressing: Min guard;Sit to/from stand   Toilet Transfer: Min guard;Ambulation;RW(B platform RW)           Functional mobility during ADLs: Min guard;Rolling walker(B platform RW) General ADL Comments: Able to ambulate approximately 800 feet in hallway today with improving activity tolerance for ADL.      Vision   Vision Assessment?: No apparent visual deficits   Perception     Praxis      Cognition Arousal/Alertness: Awake/alert Behavior During Therapy: WFL for tasks assessed/performed Overall Cognitive Status: Within Functional Limits for tasks assessed                                          Exercises     Shoulder  Instructions       General Comments Wife present during session. On the phone for portion of session.     Pertinent Vitals/ Pain       Pain Assessment: No/denies pain Faces Pain Scale: Hurts a little bit Pain Location: chronic back pain; relieved with movement; reports improved after ambulation  Pain Descriptors / Indicators: Aching Pain Intervention(s): Monitored during session  Home Living                                          Prior Functioning/Environment              Frequency  Min 2X/week         Progress Toward Goals  OT Goals(current goals can now be found in the care plan [name])  Progress towards OT goals: Progressing toward goals  Acute Rehab OT Goals Patient Stated Goal: to get back to exercising OT Goal Formulation: With patient/family Time For Goal Achievement: 02/01/18 Potential to Achieve Goals: Good  Plan Discharge plan remains appropriate    Co-evaluation                 AM-PAC PT "6 Clicks" Daily Activity     Outcome Measure   Help from another person eating meals?: None Help from another person taking care of personal grooming?: A Little Help from another person toileting, which includes using toliet, bedpan, or urinal?: A Little Help from another person bathing (including washing, rinsing, drying)?: A Little Help from another person to put on and taking off regular upper body clothing?: A Little Help from another person to put on and taking off regular lower body clothing?: A Little 6 Click Score: 19    End of Session Equipment Utilized During Treatment: Rolling walker;Gait belt;Oxygen  OT Visit Diagnosis: Muscle weakness (generalized) (M62.81)   Activity Tolerance Patient tolerated treatment well   Patient Left with call bell/phone within reach;with family/visitor present;in bed(seated at EOB)   Nurse Communication Mobility status        Time: 8527-7824 OT Time Calculation (min): 25 min  Charges: OT General Charges $OT Visit: 1 Visit OT Treatments $Self Care/Home Management : 23-37 mins  Joseph Section, MS OTR/L  Pager: [phone]    Joseph [name] 01/20/2018, 5:44 PM

## 2018-01-20 NOTE — Progress Notes (Signed)
PROGRESS NOTE        PATIENT DETAILS Name: Joseph [name] Age: 67 y.o. Sex: male Date of Birth: 1951/04/15 Admit Date: 01/11/2018 Admitting Physician Reyne Dumas, MD NSQ:[id number], Early Chars, MD  Brief Narrative: Patient is a 67 y.o. 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  Briefly managed in the intensive care unit-upon stability, transfered to the triad hospitalist service on 5/22.  Further hospital course has been complicated by development of A. fib with RVR. Awaiting transfer to [facility] when bed available, I personally discussed with [facility] transfer team again on 01/18/2018 and was told that he still on the list and bed is still not available.  Subjective: Patient in bed, appears comfortable, denies any headache, no fever, no chest pain or pressure, no shortness of breath , no abdominal pain. No focal weakness.   Assessment/Plan:  Septic shock secondary to polymicrobial bloodstream infection (enterococcus, CoNS and Clostridium perfringens) and left upper lobe pneumonia: Sepsis pathophysiology has resolved, he is clinically improved, infectious disease following with recommendations to complete 8 additional days of IV Unasyn from 8/24.  Upon his request he has been transferred to [facility] bed is awaited.  As discussed with Dr. Graylon Good ID note TEE at this time, patient clinically does not appear toxic or septic anymore, still await [facility] bed  overall much improved goal is to complete IV antibiotic treatment in case bed does not open up and then discharge.  For now antibiotic stop date is 01/24/2018.  Neutropenia: Has had issues with recurrent neutropenia-as noted above, plans were to follow-up with his hematologist at [facility] for a bone marrow biopsy.  Unfortunately has had numerous recent hospitalizations for infection/sepsis in the past 6 months.  Neutropenia has improved-patient received 1 dose of Granix on 5/21.  Unlikely to have Felty syndrome as he does not have any evidence of splenomegaly. Dr. Sherral Hammers discussed with patient's hematologist on 5/23-regarding timing of bone marrow biopsy.  He is awaiting bed at [facility] discharged without transfer due to bed unavailability will follow with his primary hematologist outpatient.  Chronic A. fib with RVR, [place] vas 2 score of at least 3: Occurred briefly while in the intensive care unit on 5/19-maintained sinus rhythm since then-only to revert back to A. fib with RVR earlier this morning.  Underwent ablation in 2018 by Dr. Lovena Le for atrial flutter.  Previous MD had spoken with EP service over the phone on 5/24-plans were to initiate anticoagulation and rate control agents if A. fib reoccurred-since it has-have started metoprolol and Eliquis.  Previous MD also discussed the case briefly with Dr. Debara Pickett over the phone on 5/25 as well.  If rate is not well controlled with metoprolol-may need Cardizem infusion.  At this time he seems to be in rate controlled with few PVCs continue beta-blocker and Eliquis combination.  Recent echo shows EF of 60%.  Electrolytes stable.  Continue present line of care.  Chronic hypoxic respiratory  failure/COPD: Stable without any evidence of exacerbation.  At baseline he uses 3 L nasal cannula oxygen which will be continued.  CAD status post CABG 2007: no anginal symptoms-remains on aspirin.  Chronic diastolic heart failure (EF 60-65% by TTE on 01/12/2018): Remains  compensated.  Rheumatoid arthritis: Continue prednisone daily.  Diarrhea: Has resolved-thought to have polymicrobial bloodstream infection due to possible bacterial translocation from diarrhea.  Recent C. difficile PCR on 5/4 was negative.  Hyperlipidemia: Continue statin  Depression/insomnia: Appears stable-continue trazodone.  Mild constipation with generalized abdominal ache.  KUB noted.  Placed on bowel regimen and now resolved.  DM-2: CBGs remain stable with SSI.   CBG (last 3)  Recent Labs    01/19/18 1711 01/19/18 2017 01/20/18 0811  GLUCAP 136* 189* 95     Telemetry (independently reviewed): A. fib with RVR  DVT Prophylaxis:   Code Status: Full code  Family Communication: Spouse at bedside  Disposition Plan: Remain inpatient-transfer to [facility] when bed available, discussed with [facility] on 01/18/2018 he is still on the list but they do not have any beds yet.  Antimicrobial agents: Anti-infectives (From admission, onward)   Start     Dose/Rate Route Frequency Ordered Stop   01/17/18 0000  Ampicillin-Sulbactam 3 g in sodium chloride 0.9 % 100 mL     3 g 200 mL/hr over 30 Minutes Intravenous Every 6 hours 01/17/18 1744     01/15/18 1430  Ampicillin-Sulbactam (UNASYN) 3 g in sodium chloride 0.9 % 100 mL IVPB  Status:  Discontinued     3 g 200 mL/hr over 30 Minutes Intravenous Every 6 hours 01/15/18 1405 01/15/18 1421   01/15/18 1430  Ampicillin-Sulbactam (UNASYN) 3 g in sodium chloride 0.9 % 100 mL IVPB     3 g 200 mL/hr over 30 Minutes Intravenous Every 6 hours 01/15/18 1421 01/23/18 1429   01/13/18 2255  vancomycin (VANCOCIN) 1,250 mg in sodium chloride 0.9 % 250 mL IVPB  Status:  Discontinued     1,250 mg 166.7 mL/hr over 90 Minutes Intravenous Every 8 hours 01/13/18 1512 01/15/18 1405   01/12/18 0800  piperacillin-tazobactam (ZOSYN) IVPB 3.375 g  Status:  Discontinued     3.375 g 12.5 mL/hr over 240 Minutes Intravenous Every 8 hours 01/11/18 0040  01/13/18 1218   01/11/18 1400  vancomycin (VANCOCIN) 1,250 mg in sodium chloride 0.9 % 250 mL IVPB  Status:  Discontinued     1,250 mg 166.7 mL/hr over 90 Minutes Intravenous Every 12 hours 01/11/18 0046 01/13/18 1512   01/11/18 0045  vancomycin (VANCOCIN) IVPB 750 mg/150 ml premix     750 mg 150 mL/hr over 60 Minutes Intravenous  Once 01/11/18 0040 01/11/18 0218   01/11/18 0030  piperacillin-tazobactam (ZOSYN) IVPB 3.375 g     3.375 g 100 mL/hr over 30 Minutes Intravenous  Once 01/11/18 0016 01/11/18 0105   01/11/18 0030  vancomycin (VANCOCIN) IVPB 1000 mg/200 mL premix     1,000 mg 200 mL/hr over 60 Minutes Intravenous  Once 01/11/18 0016 01/11/18 0146      Procedures: None  CONSULTS:  pulmonary/intensive care and ID  Time spent: 25 minutes-Greater than 50% of this time was spent in counseling, explanation of diagnosis, planning of further management, and coordination of care.  MEDICATIONS: Scheduled Meds: . apixaban  5 mg Oral BID  . arformoterol  15 mcg Nebulization BID  . aspirin  81 mg Oral Daily  . bisacodyl  10 mg Rectal Daily  . budesonide (PULMICORT) nebulizer  solution  0.5 mg Nebulization BID  . dextromethorphan-guaiFENesin  1 tablet Oral BID  . folic acid  1 mg Oral Daily  . furosemide  20 mg Oral QPM  . furosemide  40 mg Oral q morning - 10a  . insulin aspart  0-15 Units Subcutaneous TID WC  . insulin aspart  0-5 Units Subcutaneous QHS  . ipratropium-albuterol  3 mL Nebulization BID  . mouth rinse  15 mL Mouth Rinse BID  . metoprolol tartrate  50 mg Oral BID  . pantoprazole  40 mg Oral BID  . polyethylene glycol  17 g Oral BID  . predniSONE  15 mg Oral Q breakfast  . pregabalin  75 mg Oral BID  . traZODone  25 mg Oral QHS  . vitamin C  500 mg Oral Daily   Continuous Infusions: . ampicillin-sulbactam (UNASYN) IV 3 g (01/20/18 0855)   PRN Meds:.Melatonin, metoprolol tartrate, nitroGLYCERIN, ondansetron (ZOFRAN) IV, oxyCODONE-acetaminophen   PHYSICAL  EXAM: Vital signs: Vitals:   01/19/18 2018 01/19/18 2030 01/20/18 0528 01/20/18 0914  BP: 113/74  112/70 105/67  Pulse: 78  67 76  Resp: '16  18 17  ' Temp: 98.3 F (36.8 C)  98.7 F (37.1 C) 97.9 F (36.6 C)  TempSrc: Oral  Oral Oral  SpO2: 99% 98% 99% 98%  Weight: 90.5 kg (199 lb 8.3 oz)     Height:       Filed Weights   01/17/18 2044 01/18/18 0500 01/19/18 2018  Weight: 90.6 kg (199 lb 11.8 oz) 90.6 kg (199 lb 11.8 oz) 90.5 kg (199 lb 8.3 oz)   Body mass index is 25.62 kg/m.   Exam  Awake Alert, Oriented X 3, No new F.N deficits, Normal affect [place].AT,PERRAL Supple Neck,No JVD, No cervical lymphadenopathy appriciated.  Symmetrical Chest wall movement, Good air movement bilaterally, CTAB RRR,No Gallops, Rubs or new Murmurs, No Parasternal Heave +ve B.Sounds, Abd Soft, No tenderness, No organomegaly appriciated, No rebound - guarding or rigidity. No Cyanosis, Clubbing or edema, No new Rash or bruise   I have personally reviewed following labs and imaging studies  LABORATORY DATA: CBC: Recent Labs  Lab 01/14/18 0240 01/15/18 0305 01/15/18 1537 01/16/18 0616 01/17/18 0415 01/18/18 0553 01/19/18 0451  WBC 5.8 10.9*  --  8.3 6.3 5.1 3.7*  NEUTROABS 4.5  --  10.2*  --  3.9 2.9 1.5*  HGB 8.7* 9.7*  --  10.8* 11.2* 11.3* 11.1*  HCT 27.6* 30.2*  --  33.5* 35.4* 35.7* 35.4*  MCV 84.7 84.6  --  85.5 85.9 86.0 84.7  PLT 107* 141*  --  168 211 205 446    Basic Metabolic Panel: Recent Labs  Lab 01/15/18 0305 01/15/18 1537 01/16/18 0616 01/17/18 0415 01/18/18 0553 01/19/18 0451 01/20/18 0534  NA 141  --  138 138 137 137  --   K 3.4* 4.0 3.8 3.8 3.9 3.8  --   CL 105  --  103 101 99* 102  --   CO2 26  --  '26 26 28 27  ' --   GLUCOSE 122*  --  100* 106* 98 101*  --   BUN 8  --  '10 14 13 15  ' --   CREATININE 0.47*  --  0.53* 0.54* 0.64 0.61  --   CALCIUM 8.5*  --  8.9 9.3 9.0 8.9  --   MG 1.8 2.0 1.8 1.8 1.8 2.0 1.8    GFR: Estimated Creatinine Clearance: 107  mL/min (by C-G formula based on  SCr of 0.61 mg/dL).  Liver Function Tests: No results for input(s): AST, ALT, ALKPHOS, BILITOT, PROT, ALBUMIN in the last 168 hours. No results for input(s): LIPASE, AMYLASE in the last 168 hours. No results for input(s): AMMONIA in the last 168 hours.  Coagulation Profile: No results for input(s): INR, PROTIME in the last 168 hours.  Cardiac Enzymes: No results for input(s): CKTOTAL, CKMB, CKMBINDEX, TROPONINI in the last 168 hours.  BNP (last 3 results) No results for input(s): PROBNP in the last 8760 hours.  HbA1C: No results for input(s): HGBA1C in the last 72 hours.  CBG: Recent Labs  Lab 01/19/18 0842 01/19/18 1211 01/19/18 1711 01/19/18 2017 01/20/18 0811  GLUCAP 110* 215* 136* 189* 95    Lipid Profile: No results for input(s): CHOL, HDL, LDLCALC, TRIG, CHOLHDL, LDLDIRECT in the last 72 hours.  Thyroid Function Tests: No results for input(s): TSH, T4TOTAL, FREET4, T3FREE, THYROIDAB in the last 72 hours.  Anemia Panel: No results for input(s): VITAMINB12, FOLATE, FERRITIN, TIBC, IRON, RETICCTPCT in the last 72 hours.  Urine analysis:    Component Value Date/Time   COLORURINE YELLOW 01/11/2018 0225   APPEARANCEUR CLEAR 01/11/2018 0225   LABSPEC 1.013 01/11/2018 0225   PHURINE 5.0 01/11/2018 0225   GLUCOSEU NEGATIVE 01/11/2018 0225   HGBUR NEGATIVE 01/11/2018 0225   HGBUR negative 04/12/2008 1103   BILIRUBINUR NEGATIVE 01/11/2018 0225   KETONESUR NEGATIVE 01/11/2018 0225   PROTEINUR NEGATIVE 01/11/2018 0225   UROBILINOGEN 0.2 10/17/2012 1236   NITRITE NEGATIVE 01/11/2018 0225   LEUKOCYTESUR NEGATIVE 01/11/2018 0225    Sepsis Labs: Lactic Acid, Venous    Component Value Date/Time   LATICACIDVEN 2.4 ([place]) 01/11/2018 2234    MICROBIOLOGY: Recent Results (from the past 240 hour(s))  Blood Culture (routine x 2)     Status: Abnormal   Collection Time: 01/11/18 12:15 AM  Result Value Ref Range Status   Specimen  Description BLOOD LEFT ANTECUBITAL  Final   Special Requests   Final    BOTTLES DRAWN AEROBIC AND ANAEROBIC Blood Culture adequate volume   Culture  Setup Time   Final    GRAM POSITIVE COCCI IN BOTH AEROBIC AND ANAEROBIC BOTTLES CRITICAL RESULT CALLED TO, READ BACK BY AND VERIFIED WITH: ESINCLAIR,PHARMD '@1900'  01/11/18 BY LHOWARD    Culture (A)  Final    STAPHYLOCOCCUS EPIDERMIDIS STAPHYLOCOCCUS SIMULANS THE SIGNIFICANCE OF ISOLATING THIS ORGANISM FROM A SINGLE SET OF BLOOD CULTURES WHEN MULTIPLE SETS ARE DRAWN IS UNCERTAIN. PLEASE NOTIFY THE MICROBIOLOGY DEPARTMENT WITHIN ONE WEEK IF SPECIATION AND SENSITIVITIES ARE REQUIRED. Performed at [facility], [place] [address] [place],  [address]    Report Status 01/16/2018 FINAL  Final  Blood Culture ID Panel (Reflexed)     Status: Abnormal   Collection Time: 01/11/18 12:15 AM  Result Value Ref Range Status   Enterococcus species NOT DETECTED NOT DETECTED Final   Listeria monocytogenes NOT DETECTED NOT DETECTED Final   Staphylococcus species DETECTED (A) NOT DETECTED Final    Comment: Methicillin (oxacillin) resistant coagulase negative staphylococcus. Possible blood culture contaminant (unless isolated from more than one blood culture draw or clinical case suggests pathogenicity). No antibiotic treatment is indicated for blood  culture contaminants. CRITICAL RESULT CALLED TO, READ BACK BY AND VERIFIED WITH: ESINCLAIR,PHARMD '@1900'  01/11/18 BY LHOWARD    Staphylococcus aureus NOT DETECTED NOT DETECTED Final   Methicillin resistance DETECTED (A) NOT DETECTED Final    Comment: CRITICAL RESULT CALLED TO, READ BACK BY AND VERIFIED WITH: ESINCLAIR,PHARMD '@1900'  01/11/18  BY LHOWARD    Streptococcus species NOT DETECTED NOT DETECTED Final   Streptococcus agalactiae NOT DETECTED NOT DETECTED Final   Streptococcus pneumoniae NOT DETECTED NOT DETECTED Final   Streptococcus pyogenes NOT DETECTED NOT DETECTED Final   Acinetobacter baumannii  NOT DETECTED NOT DETECTED Final   Enterobacteriaceae species NOT DETECTED NOT DETECTED Final   Enterobacter cloacae complex NOT DETECTED NOT DETECTED Final   Escherichia coli NOT DETECTED NOT DETECTED Final   Klebsiella oxytoca NOT DETECTED NOT DETECTED Final   Klebsiella pneumoniae NOT DETECTED NOT DETECTED Final   Proteus species NOT DETECTED NOT DETECTED Final   Serratia marcescens NOT DETECTED NOT DETECTED Final   Haemophilus influenzae NOT DETECTED NOT DETECTED Final   Neisseria meningitidis NOT DETECTED NOT DETECTED Final   Pseudomonas aeruginosa NOT DETECTED NOT DETECTED Final   Candida albicans NOT DETECTED NOT DETECTED Final   Candida glabrata NOT DETECTED NOT DETECTED Final   Candida krusei NOT DETECTED NOT DETECTED Final   Candida parapsilosis NOT DETECTED NOT DETECTED Final   Candida tropicalis NOT DETECTED NOT DETECTED Final    Comment: Performed at [facility], [place] [address] [place], [place] [address]  Blood Culture (routine x 2)     Status: Abnormal   Collection Time: 01/11/18 12:20 AM  Result Value Ref Range Status   Specimen Description BLOOD RIGHT FOREARM  Final   Special Requests   Final    BOTTLES DRAWN AEROBIC AND ANAEROBIC Blood Culture adequate volume   Culture  Setup Time   Final    GRAM POSITIVE RODS ANAEROBIC BOTTLE ONLY CRITICAL RESULT CALLED TO, READ BACK BY AND VERIFIED WITH: E SINCLAIR,PHARMD AT 9528 01/11/18 BY L BENFIELD GRAM POSITIVE COCCI AEROBIC BOTTLE ONLY CRITICAL VALUE NOTED.  VALUE IS CONSISTENT WITH PREVIOUSLY REPORTED AND CALLED VALUE. Performed at [facility], [place] [address] [place], [place] [address]    Culture (A)  Final    ENTEROCOCCUS FAECALIS STAPHYLOCOCCUS HAEMOLYTICUS CLOSTRIDIUM PERFRINGENS    Report Status 01/19/2018 FINAL  Final   Organism ID, Bacteria ENTEROCOCCUS FAECALIS  Final   Organism ID, Bacteria STAPHYLOCOCCUS HAEMOLYTICUS  Final      Susceptibility   Enterococcus faecalis - MIC*    AMPICILLIN <=2  SENSITIVE Sensitive     VANCOMYCIN 2 SENSITIVE Sensitive     GENTAMICIN SYNERGY SENSITIVE Sensitive     * ENTEROCOCCUS FAECALIS   Staphylococcus haemolyticus - MIC*    CIPROFLOXACIN >=8 RESISTANT Resistant     ERYTHROMYCIN >=8 RESISTANT Resistant     GENTAMICIN 8 INTERMEDIATE Intermediate     OXACILLIN >=4 RESISTANT Resistant     TETRACYCLINE >=16 RESISTANT Resistant     VANCOMYCIN 1 SENSITIVE Sensitive     TRIMETH/SULFA >=320 RESISTANT Resistant     CLINDAMYCIN <=0.25 SENSITIVE Sensitive     RIFAMPIN <=0.5 SENSITIVE Sensitive     Inducible Clindamycin NEGATIVE Sensitive     * STAPHYLOCOCCUS HAEMOLYTICUS  Blood Culture ID Panel (Reflexed)     Status: None   Collection Time: 01/11/18 12:20 AM  Result Value Ref Range Status   Enterococcus species NOT DETECTED NOT DETECTED Final   Listeria monocytogenes NOT DETECTED NOT DETECTED Final   Staphylococcus species NOT DETECTED NOT DETECTED Final   Staphylococcus aureus NOT DETECTED NOT DETECTED Final   Streptococcus species NOT DETECTED NOT DETECTED Final   Streptococcus agalactiae NOT DETECTED NOT DETECTED Final   Streptococcus pneumoniae NOT DETECTED NOT DETECTED Final   Streptococcus pyogenes NOT DETECTED NOT DETECTED Final  Acinetobacter baumannii NOT DETECTED NOT DETECTED Final   Enterobacteriaceae species NOT DETECTED NOT DETECTED Final   Enterobacter cloacae complex NOT DETECTED NOT DETECTED Final   Escherichia coli NOT DETECTED NOT DETECTED Final   Klebsiella oxytoca NOT DETECTED NOT DETECTED Final   Klebsiella pneumoniae NOT DETECTED NOT DETECTED Final   Proteus species NOT DETECTED NOT DETECTED Final   Serratia marcescens NOT DETECTED NOT DETECTED Final   Haemophilus influenzae NOT DETECTED NOT DETECTED Final   Neisseria meningitidis NOT DETECTED NOT DETECTED Final   Pseudomonas aeruginosa NOT DETECTED NOT DETECTED Final   Candida albicans NOT DETECTED NOT DETECTED Final   Candida glabrata NOT DETECTED NOT DETECTED Final    Candida krusei NOT DETECTED NOT DETECTED Final   Candida parapsilosis NOT DETECTED NOT DETECTED Final   Candida tropicalis NOT DETECTED NOT DETECTED Final    Comment: Performed at [facility], [place] [address], [place], [place] [address]  Urine culture     Status: None   Collection Time: 01/11/18  2:28 AM  Result Value Ref Range Status   Specimen Description URINE, CLEAN CATCH  Final   Special Requests NONE  Final   Culture   Final    NO GROWTH Performed at [facility], [place] [address] [place], [place] [address]    Report Status 01/12/2018 FINAL  Final  Culture, blood (routine x 2)     Status: None   Collection Time: 01/12/18  6:40 AM  Result Value Ref Range Status   Specimen Description BLOOD LEFT HAND  Final   Special Requests   Final    BOTTLES DRAWN AEROBIC ONLY Blood Culture adequate volume   Culture   Final    NO GROWTH 5 DAYS Performed at [facility], [place] [address] [place], [place] [address]    Report Status 01/17/2018 FINAL  Final  Culture, blood (routine x 2)     Status: None   Collection Time: 01/12/18  6:49 AM  Result Value Ref Range Status   Specimen Description BLOOD RIGHT HAND  Final   Special Requests   Final    BOTTLES DRAWN AEROBIC ONLY Blood Culture adequate volume   Culture   Final    NO GROWTH 5 DAYS Performed at [facility], [place] [address], [place], [place] [address]    Report Status 01/17/2018 FINAL  Final  Culture, blood (routine x 2)     Status: None   Collection Time: 01/13/18  2:28 PM  Result Value Ref Range Status   Specimen Description BLOOD LEFT HAND  Final   Special Requests   Final    BOTTLES DRAWN AEROBIC ONLY Blood Culture results may not be optimal due to an inadequate volume of blood received in culture bottles   Culture   Final    NO GROWTH 5 DAYS Performed at [facility], [place] [address], [place], [place] [address]    Report Status 01/18/2018 FINAL  Final  Culture, blood (routine x 2)     Status:  None   Collection Time: 01/13/18  2:32 PM  Result Value Ref Range Status   Specimen Description BLOOD RIGHT HAND  Final   Special Requests   Final    BOTTLES DRAWN AEROBIC AND ANAEROBIC Blood Culture adequate volume   Culture   Final    NO GROWTH 5 DAYS Performed at [facility], [place] [address] [place], [place] [address]    Report Status 01/18/2018 FINAL  Final    RADIOLOGY STUDIES/RESULTS: Dg Chest 2 View  Result Date: 01/02/2018 CLINICAL DATA:  Dyspnea. Re-evaluate left upper lobe cavitary lesion. EXAM: CHEST - 2 VIEW COMPARISON:  12/27/2017. FINDINGS: Stable enlarged cardiac silhouette and post CABG changes. The previously seen 7.6 cm left upper lobe cavitary lesion again measures 7.6 cm in maximum diameter. The this has interval increased density. Adjacent patchy opacity is unchanged. Right upper lobe bullous changes again demonstrated. The interstitial markings remain prominent. Diffuse osteopenia. IMPRESSION: 1. Interval fluid accumulation within the previously demonstrated left upper lobe cavitary lesion. 2. Stable mild adjacent patchy pneumonia or atelectasis. 3. Stable cardiomegaly, chronic interstitial lung disease and right upper lobe bullous changes. Electronically Signed   By: Claudie Revering M.D.   On: 01/02/2018 12:55   Dg Chest 2 View  Result Date: 12/22/2017 CLINICAL DATA:  Rheumatoid flare up with fever for 2 days. Ex-smoker. EXAM: CHEST - 2 VIEW COMPARISON:  Radiographs 10/27/2017 and 11/08/2017.  CT 08/14/2017. FINDINGS: The heart size and mediastinal contours are stable status post median sternotomy and CABG. There is aortic atherosclerosis. Diffuse central airway thickening is present. There are multiple upper lobe bulla. Dominant left upper lobe lesion demonstrates interval increased wall thickening which may be secondary to superimposed infection or hemorrhage. There is no focal airspace disease, pleural effusion or pneumothorax. No acute osseous findings are evident.  IMPRESSION: Interval increased wall thickening of dominant left upper lobe bulla, possibly due to super infection or hemorrhage. Diffuse central airway thickening. No focal airspace disease. Electronically Signed   By: Richardean Sale M.D.   On: 12/22/2017 10:50   Ct Chest W Contrast  Result Date: 01/11/2018 CLINICAL DATA:  Acute onset of generalized weakness and fever. Decreased oral intake. EXAM: CT CHEST WITH CONTRAST TECHNIQUE: Multidetector CT imaging of the chest was performed during intravenous contrast administration. CONTRAST:  114m OMNIPAQUE IOHEXOL 300 MG/ML  SOLN COMPARISON:  Chest radiograph performed earlier today at 12:22 a.m., and CT of the chest performed 12/22/2017 FINDINGS: Cardiovascular: The heart is normal in size. Diffuse coronary artery calcifications are seen. Scattered calcification noted along the thoracic aorta and proximal great vessels. The descending thoracic aorta is borderline normal in caliber. Mediastinum/Nodes: The patient is status post median sternotomy. Visualized mediastinal nodes remain borderline normal in size. No pericardial effusion is identified. The thyroid gland is unremarkable. No axillary lymphadenopathy is seen. 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 Peripheral honeycombing is noted bilaterally. Mild interstitial prominence may reflect mild interstitial edema. No definite pleural effusion or pneumothorax is seen. Upper Abdomen: The visualized portions of the liver and spleen are unremarkable. The patient is status post cholecystectomy, with a clip noted at the gallbladder fossa. The visualized portions of the spleen are unremarkable. A tiny hiatal hernia is noted. Musculoskeletal: No acute osseous abnormalities are identified. The visualized musculature is unremarkable in appearance. IMPRESSION: 1. Multiple cavitations  noted at the left lung apex, with new air-fluid levels, concerning for atypical infection superimposed on chronic bullous disease. 2. Mild interstitial prominence may reflect mild interstitial edema. 3. Diffuse coronary artery calcifications seen. 4. Tiny hiatal hernia noted. Electronically Signed   By: JGarald BaldingM.D.   On: 01/11/2018 04:23   Ct Chest W Contrast  Result Date: 12/22/2017 CLINICAL DATA:  Fever EXAM: CT CHEST WITH CONTRAST TECHNIQUE: Multidetector CT imaging of the chest was performed during intravenous contrast administration. CONTRAST:  828m  ISOVUE-300 IOPAMIDOL (ISOVUE-300) INJECTION 61% COMPARISON:  Chest radiograph December 22, 2016; chest CT August 14, 2017 FINDINGS: Cardiovascular: There is no thoracic aortic aneurysm or dissection. There is calcification at the origins of the left common carotid and left subclavian arteries. Mild scattered calcification is noted elsewhere the great vessels. There is aortic atherosclerosis. There are foci of native coronary artery calcification. The patient is status post coronary artery bypass grafting. There is no major vessel pulmonary embolus. No pericardial effusion or pericardial thickening is evident. Mediastinum/Nodes: Thyroid appears normal. There are multiple subcentimeter mediastinal lymph nodes. There is a subcarinal lymph node measuring 1.3 x 1.0 cm. There is a second sub- carinal lymph node measuring 1.2 x 1.0 cm. There is a small hiatal hernia. Lungs/Pleura: There is underlying bullous emphysematous change. Extensive bullous disease is noted in both upper lobes. In comparison with the previous study, there is a bulla in the posterior segment left upper lobe measuring 7.7 x 4.6 cm. The wall of this bulla has become considerably thicker compared to most recent study. There is also thickening along the nearby major fissure with localized consolidation adjacent to the major fissure in the posterior segment of the left upper lobe. There is no  other area of consolidation. There are areas of scarring and atelectatic change in the lung bases. There are scattered areas of fibrosis which appear stable. There is a nodular opacity along the right major fissure measuring 9 x 5 mm, stable and likely representing an intrafissural lymph node. Upper Abdomen: Gallbladder is absent. Spleen is incompletely visualized. Spleen measures 13.8 cm from anterior to posterior dimension which is prominent. There is upper abdominal aortic atherosclerosis. Musculoskeletal: No blastic or lytic bone lesions are evident. Patient is status post median sternotomy. No chest wall lesions are evident. IMPRESSION: 1. 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 These changes are similar compared to prior study. Note, however, that the largest bulla, located in the posterior segment left upper lobe, shows considerable wall thickening, a change from the previous study. There is a nearby area of airspace consolidation in the posterior segment of the left upper lobe adjacent to the major fissure, felt to represent pneumonia. The thickening of the wall of the bulla is most likely due to infection of this bulla. Atypical cystic neoplasm may present in this manner and cannot be entirely excluded at this time. Note that there is no evidence of mycetoma in this bulla or elsewhere. Given this circumstance, would advise repeat CT after treatment for pneumonia to assess for stability or possible resolution of wall thickening in this bulla. Given the possibility of underlying neoplasm, close clinical and imaging surveillance advised in this regard. 2. Mildly prominent sub-carinal lymph nodes of uncertain etiology. These lymph nodes could well be reactive given the parenchymal lung changes. 3. Status post coronary artery bypass grafting. There is aortic and great vessel atherosclerotic calcification as well as calcification in native coronary arteries. 4.  Small  hiatal hernia. 5.  Spleen appears enlarged although incompletely visualized. 6.  Gallbladder surgically absent. Aortic Atherosclerosis (ICD10-I70.0) and Emphysema (ICD10-J43.9). Electronically Signed   By: Lowella Grip III M.D.   On: 12/22/2017 13:17   Ct Abdomen Pelvis W Contrast  Result Date: 01/13/2018 CLINICAL DATA:  Painless diarrhea EXAM: CT ABDOMEN AND PELVIS WITH CONTRAST TECHNIQUE: Multidetector CT imaging of the abdomen and pelvis was performed using the standard protocol following bolus administration of intravenous contrast. CONTRAST:  146m OMNIPAQUE IOHEXOL 300 MG/ML  SOLN COMPARISON:  08/14/2017 FINDINGS: LOWER CHEST: Small left pleural effusion. Emphysema and bibasilar atelectasis. HEPATOBILIARY: Normal hepatic contours and density. No intra- or extrahepatic biliary dilatation. Status post cholecystectomy. PANCREAS: Normal parenchymal contours without ductal dilatation. No peripancreatic fluid collection. SPLEEN: Normal. ADRENALS/URINARY TRACT: --Adrenal glands: Normal. --Right kidney/ureter: Multiple nonobstructing renal stones measuring up 2 7 mm. No hydronephrosis. No ureterolithiasis. --Left kidney/ureter: Tiny non-obstructing 1-2 mm stone at the lower pole. No hydronephrosis. --Urinary bladder: Normal for degree of distention STOMACH/BOWEL: --Stomach/Duodenum: No hiatal hernia or other gastric abnormality. Normal duodenal course. --Small bowel: No dilatation or inflammation. --Colon: There is a fecal management system in the rectum. There is sigmoid diverticulosis without acute inflammation. The remainder of the colon is normal. --Appendix: Normal. VASCULAR/LYMPHATIC: Atherosclerotic calcification is present within the non-aneurysmal abdominal aorta, without hemodynamically significant stenosis. The portal vein, splenic vein, superior mesenteric vein and IVC are patent. No abdominal or pelvic lymphadenopathy. REPRODUCTIVE: Normal prostate and seminal vesicles. MUSCULOSKELETAL. No bony  spinal canal stenosis or focal osseous abnormality. OTHER: None. IMPRESSION: 1. No acute abnormality of the abdomen or pelvis. 2. Small left pleural effusion. 3. Bilateral nonobstructive nephrolithiasis. 4. Aortic Atherosclerosis (ICD10-I70.0) and Emphysema (ICD10-J43.9). Electronically Signed   By: Ulyses Jarred M.D.   On: 01/13/2018 17:57   Dg Chest Port 1 View  Result Date: 01/11/2018 CLINICAL DATA:  Sepsis, fever and weakness.  Dyspnea x1 day. EXAM: PORTABLE CHEST 1 VIEW COMPARISON:  Chest CT 12/22/2017, CXR 01/02/2018 FINDINGS: Stable cardiomegaly with post CABG change and aortic atherosclerosis. Redemonstration of left apical bullae, the largest is partially filled with fluid and thick-walled compatible with an infected bulla. This is slightly smaller in appearance than on prior more cavitary in appearance than recent comparison chest radiograph. Subsegmental scarring is seen in the right upper lobe and mid lung. Probable trace right effusion. No acute osseous abnormality. IMPRESSION: 1. Cardiomegaly with aortic atherosclerosis and post CABG change. 2. Thick-walled left upper lobe bulla compatible with an infected bulla is redemonstrated though slightly more cavitary in appearance and less opacified than on prior comparison. This would suggest some partial clearing of fluid within. Electronically Signed   By: Ashley Royalty M.D.   On: 01/11/2018 00:46   Dg Chest Port 1 View  Result Date: 12/27/2017 CLINICAL DATA:  Sepsis. EXAM: PORTABLE CHEST 1 VIEW COMPARISON:  Chest radiograph 12/27/2017, chest CT 12/22/2017 FINDINGS: Postsurgical changes from CABG. Cardiomediastinal silhouette is normal. Mediastinal contours appear intact. Emphysema, chronic interstitial lung changes. Previously noted thick-walled cystic structure in the left upper lobe has increased in size no measuring 7.6 cm, with increased surrounding airspace consolidation. Osseous structures are without acute abnormality. Soft tissues are grossly  normal. IMPRESSION: Known left upper lobe thick-walled cavitary lesion has increased in size with increased surrounding airspace consolidation. Emphysema with chronic interstitial lung changes. Electronically Signed   By: Fidela Salisbury M.D.   On: 12/27/2017 17:34   Dg Chest Port 1 View  Result Date: 12/24/2017 CLINICAL DATA:  Pneumonia. EXAM: PORTABLE CHEST 1 VIEW COMPARISON:  CT scan and radiographs of December 22, 2017. FINDINGS: Stable cardiomediastinal silhouette. Status post coronary artery bypass graft. No pneumothorax is noted. Stable scarring is seen throughout both lungs, most prominently seen in right upper lobe. Left upper lobe bulla is again noted with significant wall thickening consistent with infection and surrounding pneumonia. Bony thorax is unremarkable. IMPRESSION: Left upper lobe bulla is again noted with significant wall thickening consistent with infection and surrounding pneumonia as described on prior CT scan. Electronically Signed   By:  Marijo Conception, M.D.   On: 12/24/2017 10:00   Dg Abd Portable 1v  Result Date: 01/18/2018 CLINICAL DATA:  Acute generalized abdominal pain. EXAM: PORTABLE ABDOMEN - 1 VIEW COMPARISON:  Radiograph of August 14, 2017. FINDINGS: No abnormal bowel dilatation is noted. Moderate amount of stool seen in the right colon. No radio-opaque calculi or other significant radiographic abnormality are seen. Status post cholecystectomy. IMPRESSION: Moderate stool burden.  No evidence of bowel obstruction or ileus. Electronically Signed   By: Marijo Conception, M.D.   On: 01/18/2018 10:14   Dg Hips Bilat With Pelvis 2v  Result Date: 01/07/2018 CLINICAL DATA:  Pain in both hips for a week EXAM: DG HIP (WITH OR WITHOUT PELVIS) 2V BILAT COMPARISON:  CT 08/14/2017 FINDINGS: Mild SI joint degenerative changes. Pubic symphysis and rami are intact. 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 No fracture or malalignment. Vascular calcifications.  IMPRESSION: Mild arthritis of the hips.  No acute osseous abnormality Electronically Signed   By: Donavan Foil M.D.   On: 01/07/2018 20:25     LOS: 9 days   Signature  Lala Lund M.D on 01/20/2018 at 10:04 AM  Between 7am to 7pm - Pager - [phone] ( page via [email or web address], text pages only, please mention full 10 digit call back number).  After 7pm go to [email or web address] - password [email or web address]

## 2018-01-20 NOTE — Progress Notes (Signed)
Physical Therapy Treatment Patient Details Name: Joseph [name] MRN: [id number] DOB: 17-Aug-1951 Today's Date: 01/20/2018    History of Present Illness Pt. is a 67 y.o. M with advanced rheumatoid arthritis on chronic prednisone 15 mg daily, bolus emphysema, chronic hypoxic respiratory failure on home O2, just discharged from this hospital on 5/17 with plans to follow up at [facility] hematology. Patient readmitted on 5/19 with septic shock secondary to polymicrobial bacteremia left upper lobe pneumonia. Briefly managed in the intensive care unit - transferred 5/22.     PT Comments    Continuing work on functional mobility and activity tolerance, with notable improvements in gait distance and activity tolerance; adjust L platform for better fit;   Pt requests a reconsult with Pulmonology as his cough has worsened and he is concerned about the amount of secretions; notified Dr. Thedore Mins of the request via Amion  Follow Up Recommendations  Supervision/Assistance - 24 hour;Home health PT;Supervision for mobility/OOB     Equipment Recommendations  None recommended by PT    Recommendations for Other Services       Precautions / Restrictions Precautions Precautions: Fall Precaution Comments: Pt also prefers to wear mask in hallway due to depressed immune system; he requests caregivers wear masks as well Restrictions Weight Bearing Restrictions: No    Mobility  Bed Mobility Overal bed mobility: Modified Independent                Transfers Overall transfer level: Needs assistance Equipment used: Bilateral platform walker Transfers: Sit to/from Stand Sit to Stand: Min guard         General transfer comment: Close guard for safety, but no need for physical assist  Ambulation/Gait Ambulation/Gait assistance: Min guard(with and without physical contact) Ambulation Distance (Feet): 400 Feet(greater tahn) Assistive device: Bilateral platform walker Gait Pattern/deviations:  Step-through pattern;Trunk flexed;Decreased stride length Gait velocity: Decreased    General Gait Details: 1 short standing rest break; overall improving activity tolerance; walked on 3 L supplemental O2; noted DOE 2/4   Social research officer, government Rank[employer] (Stroke Patients Only)       Balance     Sitting balance-Leahy Scale: Good       Standing balance-Leahy Scale: Poor(approaching Fair) Standing balance comment: reliant on external support                            Cognition Arousal/Alertness: Awake/alert Behavior During Therapy: WFL for tasks assessed/performed Overall Cognitive Status: Within Functional Limits for tasks assessed                                        Exercises      General Comments        Pertinent Vitals/Pain Pain Assessment: Faces Faces Pain Scale: Hurts a little bit Pain Location: chronic back pain; relieved with movement; reports improved after ambulation  Pain Descriptors / Indicators: Aching Pain Intervention(s): Monitored during session    Home Living                      Prior Function            PT Goals (current goals can now be found in the care plan section) Acute Rehab PT Goals Patient Stated Goal: to get back to  exercising PT Goal Formulation: With patient Time For Goal Achievement: 01/30/18 Potential to Achieve Goals: Good Progress towards PT goals: Progressing toward goals    Frequency    Min 3X/week      PT Plan Current plan remains appropriate    Co-evaluation              AM-PAC PT "6 Clicks" Daily Activity  Outcome Measure  Difficulty turning over in bed (including adjusting bedclothes, sheets and blankets)?: None Difficulty moving from lying on back to sitting on the side of the bed? : None Difficulty sitting down on and standing up from a chair with arms (e.g., wheelchair, bedside commode, etc,.)?: A Little Help needed moving  to and from a bed to chair (including a wheelchair)?: A Little Help needed walking in hospital room?: A Little Help needed climbing 3-5 steps with a railing? : A Lot 6 Click Score: 19    End of Session Equipment Utilized During Treatment: Oxygen;Gait belt Activity Tolerance: Patient tolerated treatment well Patient left: in bed;with call bell/phone within reach(sitting EOB) Nurse Communication: Mobility status PT Visit Diagnosis: Unsteadiness on feet (R26.81);Other abnormalities of gait and mobility (R26.89);Muscle weakness (generalized) (M62.81)     Time: 3016-0109 PT Time Calculation (min) (ACUTE ONLY): 32 min  Charges:  $Gait Training: 23-37 mins                    G Codes:       Joseph [name], Joseph [name]  Acute Rehabilitation Services Pager [phone] Office [phone]    Joseph [name] 01/20/2018, 3:54 PM

## 2018-01-20 NOTE — Progress Notes (Signed)
If discussed at Long Length of Stay Meetings, dates discussed:   Case was discussed in Length of Stay, per Medical Advisor Alternate Acute [facility] was recommended.  NCM discussed with Attending Dr Viviana Simpler, RN 01/20/2018, 9:29 AM

## 2018-01-21 ENCOUNTER — Inpatient Hospital Stay (HOSPITAL_COMMUNITY): Payer: Medicare HMO

## 2018-01-21 ENCOUNTER — Encounter (HOSPITAL_COMMUNITY)
Admission: EM | Disposition: A | Discharge status: Home Health | Payer: Self-pay | Source: Home / Self Care | Attending: Internal Medicine

## 2018-01-21 DIAGNOSIS — R0902 Hypoxemia: Secondary | ICD-10-CM

## 2018-01-21 DIAGNOSIS — E876 Hypokalemia: Secondary | ICD-10-CM

## 2018-01-21 DIAGNOSIS — R05 Cough: Secondary | ICD-10-CM

## 2018-01-21 DIAGNOSIS — J9811 Atelectasis: Secondary | ICD-10-CM

## 2018-01-21 DIAGNOSIS — J9621 Acute and chronic respiratory failure with hypoxia: Secondary | ICD-10-CM

## 2018-01-21 DIAGNOSIS — B9689 Other specified bacterial agents as the cause of diseases classified elsewhere: Secondary | ICD-10-CM

## 2018-01-21 LAB — BASIC METABOLIC PANEL
ANION GAP: 12 (ref 5–15)
BUN: 18 mg/dL (ref 6–20)
CHLORIDE: 98 mmol/L — AB (ref 101–111)
CO2: 28 mmol/L (ref 22–32)
Calcium: 9.2 mg/dL (ref 8.9–10.3)
Creatinine, Ser: 0.7 mg/dL (ref 0.61–1.24)
Glucose, Bld: 99 mg/dL (ref 65–99)
POTASSIUM: 3.4 mmol/L — AB (ref 3.5–5.1)
SODIUM: 138 mmol/L (ref 135–145)

## 2018-01-21 LAB — CBC WITH DIFFERENTIAL/PLATELET
BASOS ABS: 0 10*3/uL (ref 0.0–0.1)
Basophils Relative: 1 %
EOS PCT: 2 %
Eosinophils Absolute: 0.1 10*3/uL (ref 0.0–0.7)
HCT: 33.5 % — ABNORMAL LOW (ref 39.0–52.0)
HEMOGLOBIN: 10.6 g/dL — AB (ref 13.0–17.0)
LYMPHS ABS: 1.3 10*3/uL (ref 0.7–4.0)
Lymphocytes Relative: 51 %
MCH: 26.7 pg (ref 26.0–34.0)
MCHC: 31.6 g/dL (ref 30.0–36.0)
MCV: 84.4 fL (ref 78.0–100.0)
MONOS PCT: 21 %
Monocytes Absolute: 0.6 10*3/uL (ref 0.1–1.0)
NEUTROS PCT: 25 %
Neutro Abs: 0.7 10*3/uL — ABNORMAL LOW (ref 1.7–7.7)
Platelets: 247 10*3/uL (ref 150–400)
RBC: 3.97 MIL/uL — AB (ref 4.22–5.81)
RDW: 16.8 % — AB (ref 11.5–15.5)
Smear Review: ADEQUATE
WBC: 2.7 10*3/uL — AB (ref 4.0–10.5)

## 2018-01-21 LAB — CBC
HCT: 34 % — ABNORMAL LOW (ref 39.0–52.0)
HEMOGLOBIN: 10.7 g/dL — AB (ref 13.0–17.0)
MCH: 26.7 pg (ref 26.0–34.0)
MCHC: 31.5 g/dL (ref 30.0–36.0)
MCV: 84.8 fL (ref 78.0–100.0)
PLATELETS: 252 10*3/uL (ref 150–400)
RBC: 4.01 MIL/uL — AB (ref 4.22–5.81)
RDW: 16.8 % — ABNORMAL HIGH (ref 11.5–15.5)
WBC: 3.1 10*3/uL — AB (ref 4.0–10.5)

## 2018-01-21 LAB — GLUCOSE, CAPILLARY
GLUCOSE-CAPILLARY: 93 mg/dL (ref 65–99)
GLUCOSE-CAPILLARY: 183 mg/dL — AB (ref 65–99)
GLUCOSE-CAPILLARY: 142 mg/dL — AB (ref 65–99)
Glucose-Capillary: 228 mg/dL — ABNORMAL HIGH (ref 65–99)

## 2018-01-21 LAB — MAGNESIUM: MAGNESIUM: 1.8 mg/dL (ref 1.7–2.4)

## 2018-01-21 SURGERY — ECHOCARDIOGRAM, TRANSESOPHAGEAL
Anesthesia: Monitor Anesthesia Care

## 2018-01-21 MED ORDER — POTASSIUM CHLORIDE CRYS ER 20 MEQ PO TBCR
40.0000 meq | EXTENDED_RELEASE_TABLET | Freq: Once | ORAL | Status: AC
  Administered 2018-01-21: 40 meq via ORAL
  Filled 2018-01-21: qty 2

## 2018-01-21 MED ORDER — TBO-FILGRASTIM 300 MCG/0.5ML ~~LOC~~ SOSY
300.0000 ug | PREFILLED_SYRINGE | Freq: Once | SUBCUTANEOUS | Status: AC
  Administered 2018-01-21: 300 ug via SUBCUTANEOUS
  Filled 2018-01-21: qty 0.5

## 2018-01-21 NOTE — Progress Notes (Addendum)
PULMONARY / CRITICAL CARE MEDICINE   Name: Joseph [name] MRN: [id number] DOB: [birth year]    ADMISSION DATE:  01/11/2018 CONSULTATION DATE:  01/11/2018  REFERRING MD:  Dr. Blinda Leatherwood   CHIEF COMPLAINT:  Febrile   HISTORY OF PRESENT ILLNESS:   67 yo male with hx of RA with BM suppression from IV MTX, Severe bullous emphysema with infected bulla, and recurrent infections was in hospital in April for infected bulla, May for neutropenia.  Was seen at [facility] by Hematology and Rheumatology.  Started on granix.  Developed fever, sepsis, altered mental status and readmitted.  Found to have enterococcal bacteremia with recurrent diarrhea.  SUBJECTIVE: Concerned about deepening cough.  VITAL SIGNS: BP 121/69 (BP Location: Left Arm)   Pulse 73   Temp 98.1 F (36.7 C)   Resp 18   Ht 6\' 2"  (1.88 m)   Wt 90.5 kg (199 lb 8.3 oz)   SpO2 98%   BMI 25.62 kg/m   INTAKE / OUTPUT: I/O last 3 completed shifts: In: 1400 [P.O.:1200; IV Piggyback:200] Out: 1800 [Urine:1800]  PHYSICAL EXAMINATION: General: Frail elderly male in no acute distress at rest HEENT: JVD or lymphadenopathy is appreciated Neuro: Intact but anxious CV: Heart sounds are regular PULM: Decreased breath sounds in the bases , non-tender, bsx4 active  Extremities: Severe contractions secondary to rheumatoid arthritis Skin: no rashes or lesions   LABS:  BMET Recent Labs  Lab 01/18/18 0553 01/19/18 0451 01/21/18 0436  NA 137 137 138  K 3.9 3.8 3.4*  CL 99* 102 98*  CO2 28 27 28   BUN 13 15 18   CREATININE 0.64 0.61 0.70  GLUCOSE 98 101* 99   Electrolytes Recent Labs  Lab 01/18/18 0553 01/19/18 0451 01/20/18 0534 01/21/18 0436  CALCIUM 9.0 8.9  --  9.2  MG 1.8 2.0 1.8 1.8   CBC Recent Labs  Lab 01/19/18 0451 01/21/18 0436 01/21/18 0741  WBC 3.7* 3.1* 2.7*  HGB 11.1* 10.7* 10.6*  HCT 35.4* 34.0* 33.5*  PLT 250 252 247   Coag's No results for input(s): APTT, INR in the last 168 hours. Sepsis  Markers No results for input(s): LATICACIDVEN, PROCALCITON, O2SATVEN in the last 168 hours. ABG No results for input(s): PHART, PCO2ART, PO2ART in the last 168 hours.  Liver Enzymes No results for input(s): AST, ALT, ALKPHOS, BILITOT, ALBUMIN in the last 168 hours. Cardiac Enzymes No results for input(s): TROPONINI, PROBNP in the last 168 hours.  Glucose Recent Labs  Lab 01/19/18 2017 01/20/18 0811 01/20/18 1212 01/20/18 1654 01/20/18 2053 01/21/18 0755  GLUCAP 189* 95 218* 154* 165* 93   Imaging No results found.   STUDIES:  CT Chest 5/19 >  Increased air-fluid level Lt upper lung bulla CT abd/pelvis 5/21 > no acute abnormality of abd / pelv, small left pleural effusion, b/l non-obstructive nephrolithiasis  CULTURES: Blood 5/19 >> Enterococcus Urine 5/19 >> negative  ANTIBIOTICS: Vancomycin 5/19 >> off Zosyn 5/19 >> 5/21 01/15/2018 Unasyn per ID>>  SIGNIFICANT EVENTS: 5/19 Presents to ED  5/21 Transfer to SDU  LINES/TUBES:  DISCUSSION: 67 yo male with recurrent infections in setting of RA, BM suppression from MTX, severe bullous emphysema with infected bulla.  Has recurrent diarrhea, and Enterococcal bacteremia.  01/21/2018 pulmonary critical care call back to bedside to evaluate.  No acute distress will order chest x-ray for completeness.  ASSESSMENT / PLAN:  Sepsis from enterococcal bacteremia. -Resolved  Pleurisy from Lt upper lung bullitis. Bullous emphysema. -Pain medicines as needed -Bronchodilators -Used blood or  valve with bronchodilators to increase pulmonary toilet -Check chest x-ray for completeness  Acute on chronic hypoxic/hypercapnic respiratory failure. -Currently on 3 L nasal cannula to keep sats greater than 88%  A fib with RVR - likely from acute stress in setting of septic shock.  Now resolved. -Monitor as needed  Hypokalemia. Recent Labs  Lab 01/18/18 0553 01/19/18 0451 01/21/18 0436  K 3.9 3.8 3.4*    -Potassium replacement  per triad  Anemia of chronic disease and critical illness. Recent Labs    01/21/18 0436 01/21/18 0741  HGB 10.7* 10.6*    -Track CBC, per protocol  Diarrhea. Hx of gastroparesis. -Continue current diet  Advanced rheumatoid arthritis. -Currently on prednisone 15 mg daily with breakfast   DM. CBG (last 3)  Recent Labs    01/20/18 1654 01/20/18 2053 01/21/18 0755  GLUCAP 154* 165* 93    Sliding scale insulin  DVT prophylaxis - SQ heparin SUP - protonix Nutrition - carb modified/heart healthy Goals of care - full code   01/21/2018 pulmonary critical care call back to the bedside patient with increasing cough duration and concern for worsening pulmonary status.   Brett Canales Minor ACNP [facility] PCCM Pager [phone] till 1 pm If no answer page [phone] 01/21/2018, 10:26 AM  Attending Note:  67 year old male with RA presenting with PNA on IV abx.  Patient had stabilized and PCCM left final recommendations and signed off.  At the patient's request, PCCM was called back with concern for "change in the cough strength" and concern that pneumonia maybe "worsening."  Upon evaluating the patient, cough strength is more than before and patient is able to expectorate more with bibasilar crackles on exam.  The phlegm is actually clearing up.  I reviewed the CXR myself, no significant change noted with bibasilar atelectasis.  Discussed with PCCM-NP.  Cough: resolving pneumonia/bronchitis  - Encouraged flutter valve use  - D/C cough suppressants  Atelectasis:   - IS use per recommendations and that was stressed to patient  - F/U CXR  Hypoxemia:  - Titrate O2 for sat of 88-92%.  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  Seems calmer at the end of the conversation with his family bedside.  PCCM  will sign off, please call back if needed.  Patient seen and examined, agree with above note.  I dictated the care and orders written for this patient under my direction.  Alyson Reedy, MD [phone]

## 2018-01-21 NOTE — Progress Notes (Signed)
Physical Therapy Treatment Patient Details Name: Joseph [name] MRN: [id number] DOB: [birth year] Today's Date: 01/21/2018    History of Present Illness Pt. is a 67 y.o. M with advanced rheumatoid arthritis on chronic prednisone 15 mg daily, bolus emphysema, chronic hypoxic respiratory failure on home O2, just discharged from this hospital on 5/17 with plans to follow up at [facility] hematology. Patient readmitted on 5/19 with septic shock secondary to polymicrobial bacteremia left upper lobe pneumonia. Briefly managed in the intensive care unit - transferred 5/22.     PT Comments    Patient remains very motivated and eager to walk. Patient is progressing very well towards their physical therapy goals. Significant increase in ambulation distance to 1200 feet with bilateral platform RW and only one short standing rest break required. Walked on 3L O2 with 90% SpO2. Will continue to progress endurance and functional strengthening.    Follow Up Recommendations  Supervision/Assistance - 24 hour;Home health PT;Supervision for mobility/OOB     Equipment Recommendations  None recommended by PT    Recommendations for Other Services       Precautions / Restrictions Precautions Precautions: Fall Precaution Comments: Pt also prefers to wear mask in hallway due to depressed immune system; he requests caregivers wear masks as well Restrictions Weight Bearing Restrictions: No    Mobility  Bed Mobility Overal bed mobility: Modified Independent                Transfers Overall transfer level: Needs assistance Equipment used: Bilateral platform walker Transfers: Sit to/from Stand Sit to Stand: Min guard         General transfer comment: Min guard assist for safety.   Ambulation/Gait Ambulation/Gait assistance: Min guard Ambulation Distance (Feet): 1200 Feet Assistive device: Bilateral platform walker Gait Pattern/deviations: Step-through pattern;Trunk flexed;Decreased stride  length Gait velocity: Decreased    General Gait Details: 1 short standing rest break. Moderate verbal cueing for upright posture. Walked on 3L O2 with 90% SpO2.    Stairs             Wheelchair Mobility    Modified Rankin (Stroke Patients Only)       Balance Overall balance assessment: Needs assistance Sitting-balance support: No upper extremity supported Sitting balance-Leahy Scale: Good     Standing balance support: Bilateral upper extremity supported;During functional activity Standing balance-Leahy Scale: Poor Standing balance comment: requires at least single UE support                            Cognition Arousal/Alertness: Awake/alert Behavior During Therapy: WFL for tasks assessed/performed Overall Cognitive Status: Within Functional Limits for tasks assessed                                        Exercises      General Comments        Pertinent Vitals/Pain Pain Assessment: No/denies pain    Home Living                      Prior Function            PT Goals (current goals can now be found in the care plan section) Acute Rehab PT Goals Patient Stated Goal: to get back to exercising PT Goal Formulation: With patient Time For Goal Achievement: 01/30/18 Potential to Achieve Goals: Good Progress towards PT goals:  Progressing toward goals    Frequency    Min 3X/week      PT Plan Current plan remains appropriate    Co-evaluation              AM-PAC PT "6 Clicks" Daily Activity  Outcome Measure  Difficulty turning over in bed (including adjusting bedclothes, sheets and blankets)?: None Difficulty moving from lying on back to sitting on the side of the bed? : None Difficulty sitting down on and standing up from a chair with arms (e.g., wheelchair, bedside commode, etc,.)?: A Little Help needed moving to and from a bed to chair (including a wheelchair)?: A Little Help needed walking in hospital  room?: A Little Help needed climbing 3-5 steps with a railing? : A Lot 6 Click Score: 19    End of Session Equipment Utilized During Treatment: Oxygen;Gait belt Activity Tolerance: Patient tolerated treatment well Patient left: in bed;with call bell/phone within reach;with nursing/sitter in room Nurse Communication: Mobility status PT Visit Diagnosis: Unsteadiness on feet (R26.81);Other abnormalities of gait and mobility (R26.89);Muscle weakness (generalized) (M62.81)     Time: 1610-9604 PT Time Calculation (min) (ACUTE ONLY): 26 min  Charges:  $Gait Training: 23-37 mins                    G Codes:       Laurina Bustle, PT, DPT Acute Rehabilitation Services  Pager: [phone]   Vanetta Mulders 01/21/2018, 5:04 PM

## 2018-01-21 NOTE — Progress Notes (Signed)
PROGRESS NOTE   Joseph [name]  ZOX:[id number]    DOB: 1951/05/08    DOA: 01/11/2018  PCP: Sharyne Richters, MD   I have briefly reviewed patients previous medical records in [facility].  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  Briefly managed in ICU by CCM and once stabilized, care transferred to hospitalist service 5/22.  Hospital course complicated by A. fib with RVR.  Patient has been awaiting transfer to [facility] pending bed availability but none available thus far.  ID and CCM consulted.   Assessment & Plan:   Principal Problem:   Bacteremia due to Enterococcus Active Problems:   HCAP (healthcare-associated pneumonia)   Sepsis ([place])   Bacteremia due to clostridium perfringens   Sepsis due to polymicrobial bacteremia (enterococcus, Staphylococcus hemolyticus & Clostridium perfringens): As per ID, thought to be due to got microflora translocation in the setting of nausea, vomiting and diarrhea.  Sepsis resolved.  As per ID, surveillance blood cultures from 5/20 and 5/21 are negative.  Had TTE but did not pursue TEE since he had quick resolution of symptoms.  ID recommends total 14 days of antibiotics, today's day #11 and last day will be 01/24/2018.  Patient had requested to be transferred to [facility] but no beds available yet.  I discussed with Dr. Baxter Flattery,  ID.  Pneumonia/presumed infected bullae with pleurisy: Pulmonary consulted on patient's request.  Aggressive pulmonary toilet.  As per pulmonology, patient has resolving pneumonia/bronchitis, atelectasis and have increased use of flutter valve and incentive spirometry.  As per ID follow-up, recommends additional week of Augmentin 875 twice daily after completion of IV antibiotics.  Recommend follow-up chest x-ray in 4 weeks to ensure resolution of abnormal findings.  Acute on chronic hypoxic respiratory failure: Secondary to pneumonia and atelectasis.  Management as above.  Wean off to home oxygen as tolerated.  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  Patient follows with Dr. Halford Chessman, pulmonology.  Neutropenia: Unclear etiology.  Supposed to follow-up with [facility] hematology for bone marrow biopsy.  Received a dose of Granix on 5/21 with appropriate response.  Felty syndrome felt to be unlikely.  He has developed neutropenia again with [place] 700 on 5/29.  I discussed with [facility] hematology team/Jonathan who discussed with patient's primary hematologist and advised Granix 300 mcg daily until [place] equal to or greater than 2K.  Anemia of chronic disease: Stable.  Outpatient follow-up with hematology for further evaluation.  Hypokalemia: Replace and follow.  Magnesium normal.  Chronic A. fib with RVR: Occurred briefly while in the ICU on 5/19.  Underwent ablation in 2018 by Dr. Lovena Le for atrial flutter.  Previous colleague discussed with EP cardiology on 5/24 and metoprolol and apixaban were started (since anticoagulation and rate control agents were to be initiated if A. fib reoccurred).  Recent echo showed EF 60%.  Currently in sinus rhythm.  CAD status post CABG 2007: No anginal symptoms.?  DC aspirin since he is on  Eliquis.  Chronic diastolic CHF: TTE 3/73/4287: LVEF 60-65%.  Compensated.  Rheumatoid arthritis on chronic prednisone: Stable without flare.  Diarrhea: Felt to be the cause of his  polymicrobial bloodstream infection.  Recent C. difficile PCR 5/4-.  Resolved.  Hyperlipidemia: Statins.  Depression and insomnia: Stable.  Constipation: Resolved.  DM2: Reasonable inpatient control.  SSI.  DVT prophylaxis: On Eliquis anticoagulation Code Status: Full Family Communication: Discussed in detail with patient's spouse at bedside Disposition: DC home versus transfer to [facility] pending clinical improvement.   Consultants:  Pulmonology Infectious disease  Procedures:  None  Antimicrobials:  IV Unasyn day 11 of 14.   Subjective: Overall feels better.  Cough more productive and shows the suction canister with creamy sputum admixed with water.  No blood.  Denies dyspnea or chest pain.  No abdominal pain.  Tolerating diet.  States that he had 2 BMs yesterday and also indicates that he ambulated couple of times in the hall yesterday.  ROS: As above.  Objective:  Vitals:   01/21/18 0526 01/21/18 0941 01/21/18 1033 01/21/18 1542  BP: 128/75 121/69  112/65  Pulse: 70 73  77  Resp: (!) '22 18  18  ' Temp: 98 F (36.7 C) 98.1 F (36.7 C)  (!) 97.3 F (36.3 C)  TempSrc: Oral   Oral  SpO2: 99% 98% 99% 99%  Weight:      Height:        Examination:  General exam: Pleasant middle-aged male, small built and frail, lying comfortably propped up in bed without distress. Respiratory system: Occasional basal crackles but otherwise clear to auscultation without wheezing or rhonchi. Respiratory effort normal. Cardiovascular system: S1 & S2 heard, RRR. No JVD, murmurs, rubs, gallops or clicks. No pedal edema.  Telemetry personally reviewed and shows sinus rhythm Gastrointestinal system: Abdomen is nondistended, soft and nontender. No organomegaly or masses felt. Normal bowel sounds heard. Central nervous system: Alert and oriented. No focal neurological deficits. Extremities: Symmetric 5 x 5 power. Skin: No rashes, lesions or ulcers Psychiatry: Judgement and insight  appear normal. Mood & affect appropriate.     Data Reviewed: I have personally reviewed following labs and imaging studies  CBC: Recent Labs  Lab 01/15/18 1537  01/17/18 0415 01/18/18 0553 01/19/18 0451 01/21/18 0436 01/21/18 0741  WBC  --    < > 6.3 5.1 3.7* 3.1* 2.7*  NEUTROABS 10.2*  --  3.9 2.9 1.5*  --  0.7*  HGB  --    < > 11.2* 11.3* 11.1* 10.7* 10.6*  HCT  --    < > 35.4* 35.7* 35.4* 34.0* 33.5*  MCV  --    < > 85.9 86.0 84.7 84.8 84.4  PLT  --    < > 211 205 250 252 247   < > = values in this interval not displayed.   Basic Metabolic Panel: Recent Labs  Lab 01/16/18 0616 01/17/18 0415 01/18/18 0553 01/19/18 0451 01/20/18 0534 01/21/18 0436  NA 138 138 137 137  --  138  K 3.8 3.8 3.9 3.8  --  3.4*  CL 103 101 99* 102  --  98*  CO2 '26 26 28 27  ' --  28  GLUCOSE 100* 106* 98 101*  --  99  BUN '10 14 13 15  ' --  18  CREATININE 0.53* 0.54* 0.64 0.61  --  0.70  CALCIUM 8.9 9.3 9.0 8.9  --  9.2  MG 1.8 1.8 1.8 2.0 1.8 1.8   CBG: Recent  Labs  Lab 01/20/18 1654 01/20/18 2053 01/21/18 0755 01/21/18 1202 01/21/18 1640  GLUCAP 154* 165* 93 183* 142*    Recent Results (from the past 240 hour(s))  Culture, blood (routine x 2)     Status: None   Collection Time: 01/12/18  6:40 AM  Result Value Ref Range Status   Specimen Description BLOOD LEFT HAND  Final   Special Requests   Final    BOTTLES DRAWN AEROBIC ONLY Blood Culture adequate volume   Culture   Final    NO GROWTH 5 DAYS Performed at [facility], [place] [address] [place], [place] [address]    Report Status 01/17/2018 FINAL  Final  Culture, blood (routine x 2)     Status: None   Collection Time: 01/12/18  6:49 AM  Result Value Ref Range Status   Specimen Description BLOOD RIGHT HAND  Final   Special Requests   Final    BOTTLES DRAWN AEROBIC ONLY Blood Culture adequate volume   Culture   Final    NO GROWTH 5 DAYS Performed at [facility], [place] [address], [place], [place] [address]     Report Status 01/17/2018 FINAL  Final  Culture, blood (routine x 2)     Status: None   Collection Time: 01/13/18  2:28 PM  Result Value Ref Range Status   Specimen Description BLOOD LEFT HAND  Final   Special Requests   Final    BOTTLES DRAWN AEROBIC ONLY Blood Culture results may not be optimal due to an inadequate volume of blood received in culture bottles   Culture   Final    NO GROWTH 5 DAYS Performed at [facility], [place] [address] [place], [place] [address]    Report Status 01/18/2018 FINAL  Final  Culture, blood (routine x 2)     Status: None   Collection Time: 01/13/18  2:32 PM  Result Value Ref Range Status   Specimen Description BLOOD RIGHT HAND  Final   Special Requests   Final    BOTTLES DRAWN AEROBIC AND ANAEROBIC Blood Culture adequate volume   Culture   Final    NO GROWTH 5 DAYS Performed at [facility], [place] [address], [place], [place] [address]    Report Status 01/18/2018 FINAL  Final         Radiology Studies: Dg Chest 2 View  Result Date: 01/21/2018 CLINICAL DATA:  Cough, pneumonia. EXAM: CHEST - 2 VIEW COMPARISON:  CT scan of Jan 11, 2018.  Radiograph of Jan 11, 2018. FINDINGS: Stable cardiomediastinal silhouette. Status post coronary artery bypass graft. Right lung is clear. Stable cavitary lesion is seen in left upper lobe concerning for infection of bulla. Increased amount of fluid is noted within the bulla. Bony thorax is unremarkable. IMPRESSION: Stable cavitary lesion seen in left upper lobe consistent with bulla with superimposed infection. Electronically Signed   By: Marijo Conception, M.D.   On: 01/21/2018 11:54        Scheduled Meds: . apixaban  5 mg Oral BID  . arformoterol  15 mcg Nebulization BID  . aspirin  81 mg Oral Daily  . bisacodyl  10 mg Rectal Daily  . budesonide (PULMICORT) nebulizer solution  0.5 mg Nebulization BID  . dextromethorphan-guaiFENesin  1 tablet Oral BID  . folic acid  1 mg Oral Daily  . furosemide  20  mg Oral QPM  . furosemide  40 mg Oral q morning - 10a  .  insulin aspart  0-15 Units Subcutaneous TID WC  . insulin aspart  0-5 Units Subcutaneous QHS  . ipratropium-albuterol  3 mL Nebulization BID  . mouth rinse  15 mL Mouth Rinse BID  . metoprolol tartrate  50 mg Oral BID  . pantoprazole  40 mg Oral BID  . polyethylene glycol  17 g Oral BID  . predniSONE  15 mg Oral Q breakfast  . pregabalin  75 mg Oral BID  . Tbo-filgastrim (GRANIX) SQ  300 mcg Subcutaneous ONCE-1800  . traZODone  25 mg Oral QHS  . vitamin C  500 mg Oral Daily   Continuous Infusions: . ampicillin-sulbactam (UNASYN) IV Stopped (01/21/18 1602)     LOS: 10 days     Vernell Leep, MD, FACP, [facility]. [facility] Pager [phone]  If 7PM-7AM, please contact night-coverage [email or web address] Password [facility] 01/21/2018, 4:43 PM

## 2018-01-21 NOTE — Progress Notes (Signed)
[facility]    Date of Admission:  01/11/2018   Total days of antibiotics 12           ID: Joseph [name] is a 67 y.o. male  with  rheumatoid arthritis on chronic prednisone 15 mg daily, bullous emphysema, chronic hypoxic respiratory failure on home O2, idiopathic neutropenia with numerous hospitalizations related to sepsis-in fact just discharged on 5/17 after being treated for SIRS, neutropenia was readmitted on 5/19 with septic shock secondary to polymicrobial bacteremia ( enterococcus, Staphylococcus hemolyticus and Clostridium perfringens) and worsening left upper lobe pneumonia- fluid filled bullae.   Principal Problem:   Bacteremia due to Enterococcus Active Problems:   HCAP (healthcare-associated pneumonia)   Sepsis ([facility])   Bacteremia due to clostridium perfringens    Subjective: Afebrile. Still using flutter valve to help with pulmonary hygiene. Ambulating more. Denies diarrhea.  Medications:  . apixaban  5 mg Oral BID  . arformoterol  15 mcg Nebulization BID  . aspirin  81 mg Oral Daily  . bisacodyl  10 mg Rectal Daily  . budesonide (PULMICORT) nebulizer solution  0.5 mg Nebulization BID  . dextromethorphan-guaiFENesin  1 tablet Oral BID  . folic acid  1 mg Oral Daily  . furosemide  20 mg Oral QPM  . furosemide  40 mg Oral q morning - 10a  . insulin aspart  0-15 Units Subcutaneous TID WC  . insulin aspart  0-5 Units Subcutaneous QHS  . ipratropium-albuterol  3 mL Nebulization BID  . mouth rinse  15 mL Mouth Rinse BID  . metoprolol tartrate  50 mg Oral BID  . pantoprazole  40 mg Oral BID  . polyethylene glycol  17 g Oral BID  . predniSONE  15 mg Oral Q breakfast  . pregabalin  75 mg Oral BID  . traZODone  25 mg Oral QHS  . vitamin C  500 mg Oral Daily    Objective: Vital signs in last 24 hours: Temp:  [98 F (36.7 C)-98.7 F (37.1 C)] 98.1 F (36.7 C) (05/29 0941) Pulse Rate:  [70-76] 73 (05/29 0941) Resp:  [18-22] 18 (05/29  0941) BP: (103-128)/(69-90) 121/69 (05/29 0941) SpO2:  [98 %-100 %] 99 % (05/29 1033) Physical Exam  Constitutional: He is oriented to person, place, and time. He appears well-developed and well-nourished. No distress.  HENT:  Mouth/Throat: Oropharynx is clear and moist. No oropharyngeal exudate.  Cardiovascular: Normal rate, regular rhythm and normal heart sounds. Exam reveals no gallop and no friction rub.  No murmur heard.  Pulmonary/Chest: Effort normal and breath sounds normal. No respiratory distress. He has no wheezes.  Abdominal: Soft. Bowel sounds are normal. He exhibits no distension. There is no tenderness.  Ext: hands have deformity c/w RA Skin: Skin is warm and dry.scattered echymosis Psychiatric: He has a normal mood and affect. His behavior is normal.     Lab Results Recent Labs    01/19/18 0451 01/21/18 0436 01/21/18 0741  WBC 3.7* 3.1* 2.7*  HGB 11.1* 10.7* 10.6*  HCT 35.4* 34.0* 33.5*  NA 137 138  --   K 3.8 3.4*  --   CL 102 98*  --   CO2 27 28  --   BUN 15 18  --   CREATININE 0.61 0.70  --     Microbiology: 5/20 blood cx NGTD Studies/Results: Dg Chest 2 View  Result Date: 01/21/2018 CLINICAL DATA:  Cough, pneumonia. EXAM: CHEST - 2 VIEW COMPARISON:  CT scan of Jan 11, 2018.  Radiograph of Jan 11, 2018. FINDINGS: Stable cardiomediastinal silhouette. Status post coronary artery bypass graft. Right lung is clear. Stable cavitary lesion is seen in left upper lobe concerning for infection of bulla. Increased amount of fluid is noted within the bulla. Bony thorax is unremarkable. IMPRESSION: Stable cavitary lesion seen in left upper lobe consistent with bulla with superimposed infection. Electronically Signed   By: Lupita Raider, M.D.   On: 01/21/2018 11:54    Assessment/Plan: Sepsis 2/2 polymicrobial bacteremia thought to be due to gut microflora translocation in the setting of n/v/d. Much improved. Currently on day 11 since he defervesced. Last day of abtx  would be on Saturday (not Friday).   Pneumonia/presumed infected bullae = has continued pulmonary hygiene exercise, would discharge on an addn week of amox/clav 875mg  BID to finish out course.  RA= continue on baseline pred 15mg   Recommend he follows up with Dr , his pulmonologist in 10-14d as follow up to his CXR and hospitalization  [facility] for Infectious Diseases Cell: [phone] Pager: [phone]  01/21/2018, 2:43 PM

## 2018-01-21 NOTE — Progress Notes (Signed)
   01/21/18 1500  Clinical Encounter Type  Visited With Patient;Patient and family together  Visit Type Follow-up  Referral From Nurse  Consult/Referral To Chaplain  Spiritual Encounters  Spiritual Needs Prayer;Emotional  Stress Factors  Patient Stress Factors Exhausted  Family Stress Factors Exhausted    Chaplain has missed this pt a number of times. This last time pt was getting ready to have shower and Pt's wife requested that her husband be seen the next day. Chaplain will refer to the next chaplain.  Alesandra Smart a Water quality scientist, [employer]

## 2018-01-22 ENCOUNTER — Telehealth: Payer: Self-pay | Admitting: Medical

## 2018-01-22 DIAGNOSIS — K5909 Other constipation: Secondary | ICD-10-CM

## 2018-01-22 DIAGNOSIS — M069 Rheumatoid arthritis, unspecified: Secondary | ICD-10-CM

## 2018-01-22 DIAGNOSIS — D708 Other neutropenia: Secondary | ICD-10-CM

## 2018-01-22 LAB — CBC WITH DIFFERENTIAL/PLATELET
Abs Immature Granulocytes: 0 10*3/uL (ref 0.0–0.1)
BASOS PCT: 1 %
Basophils Absolute: 0 10*3/uL (ref 0.0–0.1)
EOS ABS: 0 10*3/uL (ref 0.0–0.7)
EOS PCT: 1 %
HEMATOCRIT: 34.4 % — AB (ref 39.0–52.0)
Hemoglobin: 10.7 g/dL — ABNORMAL LOW (ref 13.0–17.0)
IMMATURE GRANULOCYTES: 1 %
LYMPHS ABS: 1.1 10*3/uL (ref 0.7–4.0)
Lymphocytes Relative: 27 %
MCH: 26.6 pg (ref 26.0–34.0)
MCHC: 31.1 g/dL (ref 30.0–36.0)
MCV: 85.4 fL (ref 78.0–100.0)
MONO ABS: 0.5 10*3/uL (ref 0.1–1.0)
MONOS PCT: 13 %
Neutro Abs: 2.4 10*3/uL (ref 1.7–7.7)
Neutrophils Relative %: 57 %
PLATELETS: 249 10*3/uL (ref 150–400)
RBC: 4.03 MIL/uL — ABNORMAL LOW (ref 4.22–5.81)
RDW: 16.9 % — AB (ref 11.5–15.5)
WBC: 4.1 10*3/uL (ref 4.0–10.5)

## 2018-01-22 LAB — BASIC METABOLIC PANEL
ANION GAP: 11 (ref 5–15)
BUN: 15 mg/dL (ref 6–20)
CALCIUM: 9.1 mg/dL (ref 8.9–10.3)
CO2: 29 mmol/L (ref 22–32)
Chloride: 99 mmol/L — ABNORMAL LOW (ref 101–111)
Creatinine, Ser: 0.59 mg/dL — ABNORMAL LOW (ref 0.61–1.24)
GFR calc Af Amer: >60 mL/min (ref >60)
GLUCOSE: 88 mg/dL (ref 65–99)
Potassium: 3.4 mmol/L — ABNORMAL LOW (ref 3.5–5.1)
Sodium: 139 mmol/L (ref 135–145)

## 2018-01-22 LAB — GLUCOSE, CAPILLARY
GLUCOSE-CAPILLARY: 145 mg/dL — AB (ref 65–99)
GLUCOSE-CAPILLARY: 205 mg/dL — AB (ref 65–99)
Glucose-Capillary: 87 mg/dL (ref 65–99)
Glucose-Capillary: 170 mg/dL — ABNORMAL HIGH (ref 65–99)

## 2018-01-22 LAB — MAGNESIUM: Magnesium: 1.6 mg/dL — ABNORMAL LOW (ref 1.7–2.4)

## 2018-01-22 MED ORDER — MAGNESIUM SULFATE 2 GM/50ML IV SOLN
2.0000 g | Freq: Once | INTRAVENOUS | Status: AC
  Administered 2018-01-22: 2 g via INTRAVENOUS
  Filled 2018-01-22: qty 50

## 2018-01-22 MED ORDER — POTASSIUM CHLORIDE CRYS ER 20 MEQ PO TBCR
40.0000 meq | EXTENDED_RELEASE_TABLET | ORAL | Status: AC
  Administered 2018-01-22 (×2): 40 meq via ORAL
  Filled 2018-01-22 (×2): qty 2

## 2018-01-22 NOTE — Progress Notes (Signed)
PROGRESS NOTE   Joseph [name]  MRN:[id number]    DOB: 03/12/1951    DOA: 01/11/2018  PCP: [name], David L, MD   I have briefly reviewed patients previous medical records in [facility].  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  Briefly managed in ICU by CCM and once stabilized, care transferred to hospitalist service 5/22.  Hospital course complicated by A. fib with RVR.  Patient has been awaiting transfer to [facility] pending bed availability but none available thus far.  ID and CCM consulted.   Assessment & Plan:   Principal Problem:   Bacteremia due to Enterococcus Active Problems:   HCAP (healthcare-associated pneumonia)   Sepsis (HCC)   Bacteremia due to clostridium perfringens   Rheumatoid arthritis of hand (HCC)   Other constipation   Sepsis due to polymicrobial bacteremia (enterococcus, Staphylococcus hemolyticus & Clostridium perfringens): As per ID, thought to be due to got microflora translocation in the setting of nausea, vomiting and diarrhea.  Sepsis resolved.  As per ID, surveillance blood cultures from 5/20 and 5/21 are negative.  Had TTE but did not pursue TEE since he had quick resolution of symptoms.  ID recommends total 14 days of antibiotics, today's day #12 and last day will be 01/24/2018.  Patient had requested to be transferred to [facility] but no  beds available yet.  I discussed with Dr. Snider, ID.  Stable.  Pneumonia/presumed infected bullae with pleurisy: Pulmonary consulted on patient's request.  Aggressive pulmonary toilet.  As per pulmonology, patient has resolving pneumonia/bronchitis, atelectasis and have increased use of flutter valve and incentive spirometry.  As per ID follow-up, recommends additional week of Augmentin 875 twice daily after completion of IV antibiotics.  Recommend follow-up chest x-ray in 4 weeks to ensure resolution of abnormal findings.  Stable.  Acute on chronic hypoxic respiratory failure: Secondary to pneumonia and atelectasis.  Management as above.  Wean off to home oxygen as tolerated.  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  Patient follows with Dr. Sood, pulmonology.  Stable and back on home level of oxygen.  Neutropenia: Unclear etiology.  [facility].  Received a dose of Granix on 5/21 with appropriate response.  Felty syndrome felt to be unlikely.  He has developed neutropenia again with ANC 700 on 5/29.  I discussed with [facility] hematology team/Jonathan on 5/29 who discussed with patient's primary hematologist and advised Granix 300 mcg daily until ANC equal to or greater than 2K.  WBC up to 4.1 and ANC 2400.  Follow CBC in a.m.  Anemia of chronic disease: Stable.  Outpatient follow-up with hematology for further evaluation.  Hypokalemia/hypomagnesemia: Continue to replace as needed and follow labs.  Chronic A. fib with RVR: Occurred briefly while in the ICU on 5/19.  Underwent ablation in 2018 by Dr. Taylor for atrial flutter.  Previous colleague discussed with EP cardiology on 5/24 and metoprolol and apixaban were started (  since anticoagulation and rate control agents were to be initiated if A. fib reoccurred).  Recent echo showed EF 60%.  Currently in sinus rhythm.  CAD status post CABG 2007: No anginal symptoms.?  DC aspirin since he is on  Eliquis >deferred to outpatient follow-up to his primary cardiologist..  Chronic diastolic CHF: TTE 01/12/2018: LVEF 60-65%.  Compensated.  Rheumatoid arthritis on chronic prednisone: Patient reports mild pain in his neck and shoulders consistent with prior episodes of rheumatoid flare but seems mild.  Continue current dose of prednisone but may have to increase if pain worsens.  Diarrhea: Felt to be the cause of his polymicrobial bloodstream infection.  Recent C. difficile PCR 5/4-.  Resolved.  Hyperlipidemia: Statins.  Depression and insomnia: Stable.  Constipation: Resolved.  DM2: Reasonable inpatient control.  SSI.  DVT prophylaxis: On Eliquis anticoagulation Code Status: Full Family Communication: Discussed in detail with patient's spouse at bedside.  Updated care and answered questions. Disposition: DC home versus transfer to [facility] pending clinical improvement.  Possible discharge home 6/1.   Consultants:  Pulmonology Infectious disease  Procedures:  None  Antimicrobials:  IV Unasyn day 12 of 14.   Subjective: Reports neck and bilateral shoulder pains similar to prior episodes of RA flare.  Mild pains.  No dyspnea.  Productive cough and able to bring up more sputum now.  No other complaints reported.  ROS: As above.  Objective:  Vitals:   01/22/18 0759 01/22/18 0842 01/22/18 0843 01/22/18 0844  BP: 118/68     Pulse: 75     Resp: 18     Temp: 99.1 F (37.3 C)     TempSrc: Oral     SpO2: 97% 98% 98% 98%  Weight:      Height:        Examination:  General exam: Pleasant middle-aged male, small built and frail, lying comfortably propped up in bed without distress.  Stable. Respiratory system: Occasional basal crackles but otherwise clear to auscultation without wheezing or rhonchi. Respiratory effort normal.  No change/stable. Cardiovascular system: S1 & S2 heard, RRR. No JVD, murmurs, rubs, gallops or clicks. No pedal edema.  Telemetry personally  reviewed: Sinus rhythm with intermittent occasional ventricular bigeminy. Gastrointestinal system: Abdomen is nondistended, soft and nontender. No organomegaly or masses felt. Normal bowel sounds heard.  Stable. Central nervous system: Alert and oriented. No focal neurological deficits.  Stable. Extremities: Symmetric 5 x 5 power.  No acute findings on exam of bilateral shoulders.  Mild painful range of movements though. Skin: No rashes, lesions or ulcers Psychiatry: Judgement and insight appear normal. Mood & affect appropriate.     Data Reviewed: I have personally reviewed following labs and imaging studies  CBC: Recent Labs  Lab 01/17/18 0415 01/18/18 0553 01/19/18 0451 01/21/18 0436 01/21/18 0741 01/22/18 0416  WBC 6.3 5.1 3.7* 3.1* 2.7* 4.1  NEUTROABS 3.9 2.9 1.5*  --  0.7* 2.4  HGB 11.2* 11.3* 11.1* 10.7* 10.6* 10.7*  HCT 35.4* 35.7* 35.4* 34.0* 33.5* 34.4*  MCV 85.9 86.0 84.7 84.8 84.4 85.4  PLT 211 205 250 252 247 249   Basic Metabolic Panel: Recent Labs  Lab 01/17/18 0415 01/18/18 0553 01/19/18 0451 01/20/18 0534 01/21/18 0436 01/22/18 0416  NA 138 137 137  --  138 139  K 3.8 3.9 3.8  --  3.4* 3.4*  CL 101 99* 102  --  98* 99*  CO2 26 28 27  --  28 29  GLUCOSE 106* 98 101*  --    99 88  BUN _0 --  18 15  CREATININE 0.54* 0.64 0.61  --  0.70 0.59*  CALCIUM 9.3 9.0 8.9  --  9.2 9.1  MG 1.8 1.8 2.0 1.8 1.8 1.6*   CBG: Recent Labs  Lab 01/21/18 1202 01/21/18 1640 01/21/18 2118 01/22/18 0757 01/22/18 1127  GLUCAP 183* 142* 228* 87 145*    Recent Results (from the past 240 hour(s))  Culture, blood (routine x 2)     Status: None   Collection Time: 01/13/18  2:28 PM  Result Value Ref Range Status   Specimen Description BLOOD LEFT HAND  Final   Special Requests   Final    BOTTLES DRAWN AEROBIC ONLY Blood Culture results may not be optimal due to an inadequate volume of blood received in culture bottles   Culture   Final    NO GROWTH 5  DAYS Performed at [facility], [place] [address] [place], [place] [address]    Report Status 01/18/2018 FINAL  Final  Culture, blood (routine x 2)     Status: None   Collection Time: 01/13/18  2:32 PM  Result Value Ref Range Status   Specimen Description BLOOD RIGHT HAND  Final   Special Requests   Final    BOTTLES DRAWN AEROBIC AND ANAEROBIC Blood Culture adequate volume   Culture   Final    NO GROWTH 5 DAYS Performed at [facility], [place] [address], [place], [place] [address]    Report Status 01/18/2018 FINAL  Final         Radiology Studies: Dg Chest 2 View  Result Date: 01/21/2018 CLINICAL DATA:  Cough, pneumonia. EXAM: CHEST - 2 VIEW COMPARISON:  CT scan of Jan 11, 2018.  Radiograph of Jan 11, 2018. FINDINGS: Stable cardiomediastinal silhouette. Status post coronary artery bypass graft. Right lung is clear. Stable cavitary lesion is seen in left upper lobe concerning for infection of bulla. Increased amount of fluid is noted within the bulla. Bony thorax is unremarkable. IMPRESSION: Stable cavitary lesion seen in left upper lobe consistent with bulla with superimposed infection. Electronically Signed   By: Marijo Conception, M.D.   On: 01/21/2018 11:54        Scheduled Meds: . apixaban  5 mg Oral BID  . arformoterol  15 mcg Nebulization BID  . aspirin  81 mg Oral Daily  . bisacodyl  10 mg Rectal Daily  . budesonide (PULMICORT) nebulizer solution  0.5 mg Nebulization BID  . dextromethorphan-guaiFENesin  1 tablet Oral BID  . folic acid  1 mg Oral Daily  . furosemide  20 mg Oral QPM  . furosemide  40 mg Oral q morning - 10a  . insulin aspart  0-15 Units Subcutaneous TID WC  . insulin aspart  0-5 Units Subcutaneous QHS  . ipratropium-albuterol  3 mL Nebulization BID  . mouth rinse  15 mL Mouth Rinse BID  . metoprolol tartrate  50 mg Oral BID  . pantoprazole  40 mg Oral BID  . polyethylene glycol  17 g Oral BID  . predniSONE  15 mg Oral Q breakfast  .  pregabalin  75 mg Oral BID  . traZODone  25 mg Oral QHS  . vitamin C  500 mg Oral Daily   Continuous Infusions: . ampicillin-sulbactam (UNASYN) IV 3 g (01/22/18 1504)     LOS: 11 days     Vernell Leep, MD, FACP, [facility]. [facility] Pager [phone]  If 7PM-7AM,  please contact night-coverage [email or web address] Password [email or web address] 01/22/2018, 3:20 PM

## 2018-01-22 NOTE — Progress Notes (Signed)
Occupational Therapy Treatment Patient Details Name: Joseph [name] MRN: [id number] DOB: 20-Apr-1951 Today's Date: 01/22/2018    History of present illness Pt. is a 67 y.o. M with advanced rheumatoid arthritis on chronic prednisone 15 mg daily, bolus emphysema, chronic hypoxic respiratory failure on home O2, just discharged from this hospital on 5/17 with plans to follow up at [facility] hematology. Patient readmitted on 5/19 with septic shock secondary to polymicrobial bacteremia left upper lobe pneumonia. Briefly managed in the intensive care unit - transferred 5/22.    OT comments  Pt grateful to ambulate in hall. Performed standing grooming and toileting with supervision for safety. Will continue to follow.  Follow Up Recommendations  Home health OT;Supervision/Assistance - 24 hour    Equipment Recommendations  None recommended by OT    Recommendations for Other Services      Precautions / Restrictions Precautions Precautions: Fall Precaution Comments: Pt also prefers to wear mask in hallway due to depressed immune system; he requests caregivers wear masks as well       Mobility Bed Mobility Overal bed mobility: Modified Independent                Transfers Overall transfer level: Needs assistance Equipment used: Bilateral platform walker Transfers: Sit to/from Stand Sit to Stand: Supervision         General transfer comment: supervision for safety and lines    Balance     Sitting balance-Leahy Scale: Good     Standing balance support: Bilateral upper extremity supported;During functional activity Standing balance-Leahy Scale: Poor Standing balance comment: requires at least single UE support                           ADL either performed or assessed with clinical judgement   ADL Overall ADL's : Needs assistance/impaired     Grooming: Min guard;Standing;Brushing hair           Upper Body Dressing : Set up;Sitting Upper Body Dressing Details  (indicate cue type and reason): donning second gown  Lower Body Dressing: Maximal assistance;Sitting/lateral leans Lower Body Dressing Details (indicate cue type and reason): for shoes Toilet Transfer: Supervision/safety;Ambulation;BSC;RW   Toileting- Clothing Manipulation and Hygiene: Supervision/safety;Sit to/from stand       Functional mobility during ADLs: Supervision/safety;Rolling walker General ADL Comments: ambulated in hall with 3L 02 and platform walker     Vision       Perception     Praxis      Cognition Arousal/Alertness: Awake/alert Behavior During Therapy: WFL for tasks assessed/performed Overall Cognitive Status: Within Functional Limits for tasks assessed                                          Exercises     Shoulder Instructions       General Comments      Pertinent Vitals/ Pain       Pain Assessment: No/denies pain  Home Living                                          Prior Functioning/Environment              Frequency  Min 2X/week        Progress Toward Goals  OT  Goals(current goals can now be found in the care plan section)  Progress towards OT goals: Progressing toward goals  Acute Rehab OT Goals Patient Stated Goal: to get back to exercising Time For Goal Achievement: 02/01/18 Potential to Achieve Goals: Good  Plan Discharge plan remains appropriate    Co-evaluation                 AM-PAC PT "6 Clicks" Daily Activity     Outcome Measure   Help from another person eating meals?: None Help from another person taking care of personal grooming?: A Little Help from another person toileting, which includes using toliet, bedpan, or urinal?: A Little Help from another person bathing (including washing, rinsing, drying)?: A Lot Help from another person to put on and taking off regular upper body clothing?: A Little Help from another person to put on and taking off regular lower body  clothing?: A Lot 6 Click Score: 17    End of Session Equipment Utilized During Treatment: Rolling walker;Gait belt;Oxygen  OT Visit Diagnosis: Muscle weakness (generalized) (M62.81)   Activity Tolerance Patient tolerated treatment well   Patient Left in bed;with call bell/phone within reach   Nurse Communication          Time: 4970-2637 OT Time Calculation (min): 34 min  Charges: OT General Charges $OT Visit: 1 Visit OT Treatments $Self Care/Home Management : 8-22 mins $Therapeutic Activity: 8-22 mins  01/22/2018 [name], OTR/L Pager: [phone]   [name] 01/22/2018, 4:24 PM

## 2018-01-22 NOTE — Telephone Encounter (Signed)
I saw pt wife today. She wanted me to make you aware of her husband recent hospitalization.

## 2018-01-23 ENCOUNTER — Telehealth: Payer: Self-pay

## 2018-01-23 LAB — CBC WITH DIFFERENTIAL/PLATELET
BASOS PCT: 1 %
Basophils Absolute: 0 10*3/uL (ref 0.0–0.1)
EOS ABS: 0 10*3/uL (ref 0.0–0.7)
Eosinophils Relative: 1 %
HCT: 34.3 % — ABNORMAL LOW (ref 39.0–52.0)
Hemoglobin: 10.8 g/dL — ABNORMAL LOW (ref 13.0–17.0)
LYMPHS ABS: 1.8 10*3/uL (ref 0.7–4.0)
Lymphocytes Relative: 38 %
MCH: 27.2 pg (ref 26.0–34.0)
MCHC: 31.5 g/dL (ref 30.0–36.0)
MCV: 86.4 fL (ref 78.0–100.0)
MONO ABS: 0.6 10*3/uL (ref 0.1–1.0)
Monocytes Relative: 13 %
Neutro Abs: 2.4 10*3/uL (ref 1.7–7.7)
Neutrophils Relative %: 47 %
PLATELETS: 232 10*3/uL (ref 150–400)
RBC: 3.97 MIL/uL — AB (ref 4.22–5.81)
RDW: 17.3 % — AB (ref 11.5–15.5)
WBC: 4.8 10*3/uL (ref 4.0–10.5)

## 2018-01-23 LAB — BASIC METABOLIC PANEL
Anion gap: 13 (ref 5–15)
BUN: 13 mg/dL (ref 6–20)
CALCIUM: 8.9 mg/dL (ref 8.9–10.3)
CO2: 24 mmol/L (ref 22–32)
Chloride: 99 mmol/L — ABNORMAL LOW (ref 101–111)
Creatinine, Ser: 0.62 mg/dL (ref 0.61–1.24)
GFR calc non Af Amer: >60 mL/min (ref >60)
Glucose, Bld: 88 mg/dL (ref 65–99)
Potassium: 3.5 mmol/L (ref 3.5–5.1)
SODIUM: 136 mmol/L (ref 135–145)

## 2018-01-23 LAB — GLUCOSE, CAPILLARY
GLUCOSE-CAPILLARY: 184 mg/dL — AB (ref 65–99)
GLUCOSE-CAPILLARY: 223 mg/dL — AB (ref 65–99)
Glucose-Capillary: 117 mg/dL — ABNORMAL HIGH (ref 65–99)
Glucose-Capillary: 181 mg/dL — ABNORMAL HIGH (ref 65–99)

## 2018-01-23 LAB — MAGNESIUM: MAGNESIUM: 1.9 mg/dL (ref 1.7–2.4)

## 2018-01-23 MED ORDER — PREDNISONE 5 MG PO TABS
25.0000 mg | ORAL_TABLET | Freq: Once | ORAL | Status: AC
  Administered 2018-01-23: 25 mg via ORAL

## 2018-01-23 MED ORDER — POTASSIUM CHLORIDE CRYS ER 20 MEQ PO TBCR
40.0000 meq | EXTENDED_RELEASE_TABLET | Freq: Every day | ORAL | Status: DC
  Administered 2018-01-24: 40 meq via ORAL
  Filled 2018-01-23: qty 2

## 2018-01-23 MED ORDER — PREDNISONE 20 MG PO TABS
40.0000 mg | ORAL_TABLET | Freq: Every day | ORAL | Status: AC
  Administered 2018-01-24: 40 mg via ORAL
  Filled 2018-01-23 (×2): qty 2

## 2018-01-23 MED ORDER — PREDNISONE 5 MG PO TABS: 15.0000 mg | ORAL_TABLET | Freq: Every day | ORAL | Status: DC

## 2018-01-23 NOTE — Telephone Encounter (Signed)
Copied from CRM [id number]. Topic: Inquiry >> Jan 23, 2018  3:50 PM Crist Infante wrote: Reason for CRM: pt will probably be dc'd tomorrow from the hospital.  Wife states 4-5 bacteria were identified.  Wife would like to know if it is reasonable to ask them to do a lab test to see if they bacteria has gone before pt goes home? The last few times pt was sent home and had to return a few days later with this same problem. (within 24 hrs) Wife would like a call back asap.  She realizes end of day, and she apologizes.+-+++++++ -`- Please call back

## 2018-01-23 NOTE — Telephone Encounter (Signed)
Noted, thank you

## 2018-01-23 NOTE — Progress Notes (Signed)
Gave neb tx then did flutter. Pt stated he like doing it that way because doing them together makes him cough through the whole tx.

## 2018-01-23 NOTE — Clinical Social Work Note (Signed)
Patient and his wife Britta Mccreedy requested to see SW as they had questions. Questions asked about durable medical equipment and [facility] services and patient/wife advised that they would need to speak with a nurse case manager on Saturday. Wife also asked about who she would talk to about the hospital bill and how his Medicare is paying, especially since he has had several hospitalizations recently. CSW advised them to call [phone] and ask to speak with someone in the business office. Patient/ wife expressed thanks for information provided.  Genelle Bal, MSW, LCSW Licensed Clinical Social Worker Clinical Social Work Department [employer] [phone]

## 2018-01-23 NOTE — Progress Notes (Signed)
Physical Therapy Treatment Patient Details Name: Joseph [name] MRN: [id number] DOB: 09/07/1950 Today's Date: 01/23/2018    History of Present Illness Pt. is a 67 y.o. M with advanced rheumatoid arthritis on chronic prednisone 15 mg daily, bolus emphysema, chronic hypoxic respiratory failure on home O2, just discharged from this hospital on 5/17 with plans to follow up at [facility] hematology. Patient readmitted on 5/19 with septic shock secondary to polymicrobial bacteremia left upper lobe pneumonia. Briefly managed in the intensive care unit - transferred 5/22.     PT Comments    Patient is progressing very well towards their physical therapy goals. Motivated and eager to participate. Walking limited by arrival of IV team today but patient continues to show improvements in endurance and activity tolerance. Recommendation for d/c remains appropriate.    Follow Up Recommendations  Supervision/Assistance - 24 hour;Home health PT;Supervision for mobility/OOB     Equipment Recommendations  None recommended by PT    Recommendations for Other Services       Precautions / Restrictions Precautions Precautions: Fall Precaution Comments: Prefers to wear bilateral diabetic shoes. Pt also prefers to wear mask in hallway due to depressed immune system; he requests caregivers wear masks as well Restrictions Weight Bearing Restrictions: No    Mobility  Bed Mobility Overal bed mobility: Modified Independent                Transfers Overall transfer level: Needs assistance Equipment used: Bilateral platform walker Transfers: Sit to/from Stand Sit to Stand: Supervision         General transfer comment: supervision for safety.   Ambulation/Gait Ambulation/Gait assistance: Min guard Ambulation Distance (Feet): 415 Feet Assistive device: Bilateral platform walker Gait Pattern/deviations: Step-through pattern;Trunk flexed;Decreased stride length Gait velocity: Decreased    General  Gait Details: Limited by IV team arriving. patient requiring 1 short standing rest break. Cueing for increased left foot clearance and upright posture.   Stairs             Wheelchair Mobility    Modified Rankin (Stroke Patients Only)       Balance Overall balance assessment: Needs assistance Sitting-balance support: No upper extremity supported Sitting balance-Leahy Scale: Good     Standing balance support: Bilateral upper extremity supported;During functional activity Standing balance-Leahy Scale: Poor Standing balance comment: requires at least single UE support                            Cognition Arousal/Alertness: Awake/alert Behavior During Therapy: WFL for tasks assessed/performed Overall Cognitive Status: Within Functional Limits for tasks assessed                                        Exercises      General Comments        Pertinent Vitals/Pain Pain Assessment: No/denies pain    Home Living                      Prior Function            PT Goals (current goals can now be found in the care plan section) Acute Rehab PT Goals Patient Stated Goal: to get back to exercising PT Goal Formulation: With patient Time For Goal Achievement: 01/30/18 Potential to Achieve Goals: Good Progress towards PT goals: Progressing toward goals    Frequency  Min 3X/week      PT Plan Current plan remains appropriate    Co-evaluation              AM-PAC PT "6 Clicks" Daily Activity  Outcome Measure  Difficulty turning over in bed (including adjusting bedclothes, sheets and blankets)?: None Difficulty moving from lying on back to sitting on the side of the bed? : None Difficulty sitting down on and standing up from a chair with arms (e.g., wheelchair, bedside commode, etc,.)?: A Little Help needed moving to and from a bed to chair (including a wheelchair)?: A Little Help needed walking in hospital room?: A  Little Help needed climbing 3-5 steps with a railing? : A Lot 6 Click Score: 19    End of Session Equipment Utilized During Treatment: Oxygen;Gait belt Activity Tolerance: Patient tolerated treatment well Patient left: in bed;with call bell/phone within reach;with nursing/sitter in room Nurse Communication: Mobility status PT Visit Diagnosis: Unsteadiness on feet (R26.81);Other abnormalities of gait and mobility (R26.89);Muscle weakness (generalized) (M62.81)     Time: 0102-7253 PT Time Calculation (min) (ACUTE ONLY): 14 min  Charges:  $Gait Training: 8-22 mins                    G Codes:     Laurina Bustle, PT, DPT Acute Rehabilitation Services  Pager: [phone]    Vanetta Mulders 01/23/2018, 5:02 PM

## 2018-01-23 NOTE — Progress Notes (Signed)
PROGRESS NOTE   Joseph [name]  YWV:[id number]    DOB: June 02, 1951    DOA: 01/11/2018  PCP: Sharyne Richters, MD   I have briefly reviewed patients previous medical records in [facility].  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  Briefly managed in ICU by CCM and once stabilized, care transferred to hospitalist service 5/22.  Hospital course complicated by A. fib with RVR.  Patient has been awaiting transfer to [facility] but did not have beds and now almost ready for discharge tomorrow.  ID and CCM consulted.   Assessment & Plan:   Principal Problem:   Bacteremia due to Enterococcus Active Problems:   HCAP (healthcare-associated pneumonia)   Sepsis ([place])   Bacteremia due to clostridium perfringens   Rheumatoid arthritis of hand ([place])   Other constipation   Sepsis due to polymicrobial bacteremia (enterococcus, Staphylococcus hemolyticus & Clostridium perfringens): As per ID, thought to be due to got microflora translocation in the setting of nausea, vomiting and diarrhea.  Sepsis resolved.  As per ID, surveillance blood cultures from 5/20 and 5/21 are negative.  Had TTE but did not pursue TEE since he had quick resolution of symptoms.  ID recommends total 14 days of antibiotics, today's day #13 and last day will be 01/24/2018.  Patient had requested to be transferred to [facility] but no beds available and patient likely to be discharged home tomorrow and has outpatient follow-up with his hematology department on Monday 6/3 for lab work and bone marrow biopsy.  I discussed with Dr. Baxter Flattery, ID.  Stable.  Pneumonia/presumed infected bullae with pleurisy: Pulmonary consulted on patient's request.  Aggressive pulmonary toilet.  As per pulmonology, patient has resolving pneumonia/bronchitis, atelectasis and have increased use of flutter valve and incentive spirometry.  As per ID follow-up, recommends additional week of Augmentin 875 twice daily after completion of IV antibiotics.  Recommend follow-up chest x-ray in 4 weeks to ensure resolution of abnormal findings.  Stable  Acute on chronic hypoxic respiratory failure: Secondary to pneumonia and atelectasis.  Management as above.  Wean off to home oxygen as tolerated.  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  Patient follows with Dr. Halford Chessman, pulmonology.  Stable and back on home level of oxygen.  No change  Neutropenia: Unclear etiology.  Supposed to follow-up with [facility] hematology for bone marrow biopsy.  Received a dose of Granix on 5/21 with appropriate response.  Felty syndrome felt to be unlikely.  He has developed neutropenia again with [place] 700 on 5/29.  I discussed with [facility] hematology team/Jonathan on 5/29 who discussed with patient's primary hematologist and advised Granix 300 mcg daily until [place] equal to or greater than 2K.  WBC up to 4.1 and ANC 2400.  Unchanged ANC/stable.  No Granix.  Anemia of chronic disease: Stable.  Outpatient follow-up with hematology for further evaluation.  Hypokalemia/hypomagnesemia: Replaced.  Chronic A. fib with RVR: Occurred briefly while in the ICU on 5/19.  Underwent ablation in 2018 by Dr. Lovena Le for atrial flutter.  Previous colleague discussed with EP cardiology on 5/24 and metoprolol and apixaban were started (since anticoagulation and rate control agents were to be initiated if A.  fib reoccurred).  Recent echo showed EF 60%.  Currently in sinus rhythm.  CAD status post CABG 2007: No anginal symptoms.?  DC aspirin since he is on Eliquis >deferred to outpatient follow-up to his primary cardiologist..  Chronic diastolic CHF: TTE 01/21/4131: LVEF 60-65%.  Compensated.  Since patient is on Lasix and having persistent hypokalemia, added scheduled potassium supplements.  Periodically follow BMP as outpatient.  Rheumatoid arthritis on chronic prednisone: Yesterday's neck and shoulder pains have resolved but today reports bilateral wrist pain consistent with prior gouty flare.  Requested lidocaine patch but advised him that we will do a brief burst of increased prednisone 40 mg daily for 2 or 3 days and then revert back to his maintenance dose of 15 mg daily.  He was agreeable.  Diarrhea: Felt to be the cause of his polymicrobial bloodstream infection.  Recent C. difficile PCR 5/4-.  Resolved.  Hyperlipidemia: Statins.  Depression and insomnia: Stable.  Constipation: Resolved.  DM2: Reasonable inpatient control.  SSI.  DVT prophylaxis: On Eliquis anticoagulation Code Status: Full Family Communication: I have been discussing in detail with patient and spouse on a daily basis, updating ongoing care in detail and answering several of their questions.  However spouse has been calling PCPs office with the same questions that have already been addressed. Disposition: DC home potentially 6/1 after completion of IV antibiotics.   Consultants:  Pulmonology Infectious disease  Procedures:  None  Antimicrobials:  IV Unasyn day 12 of 14.   Subjective: Denies breathing problems.  Neck and shoulder pains resolved.  Now reports bilateral wrist pains.  ROS: As above.  Objective:  Vitals:   01/23/18 0500 01/23/18 0746 01/23/18 0938 01/23/18 1722  BP:   100/62 103/65  Pulse:   84 73  Resp:   18 20  Temp:   97.9 F (36.6 C) 97.7 F (36.5 C)  TempSrc:   Oral Oral  SpO2:   97% 94% 99%  Weight: 89.2 kg (196 lb 10.4 oz)     Height:        Examination:  General exam: Pleasant middle-aged male, small built and frail, lying comfortably propped up in bed without distress.  Stable. Respiratory system: Occasional basal crackles but otherwise clear to auscultation without wheezing or rhonchi. Respiratory effort normal.  Stable without change Cardiovascular system: S1 & S2 heard, RRR. No JVD, murmurs, rubs, gallops or clicks. No pedal edema.  Telemetry personally reviewed: Sinus rhythm Gastrointestinal system: Abdomen is nondistended, soft and nontender. No organomegaly or masses felt. Normal bowel sounds heard.  Stable Central nervous system: Alert and oriented. No focal neurological deficits.  Stable Extremities: Symmetric 5 x 5 power.  Bilateral wrist mildly warm and tender but no increased swelling or redness.  Painful range of movements. Skin: No rashes, lesions or ulcers Psychiatry: Judgement and insight appear normal. Mood & affect appropriate.     Data Reviewed: I have personally reviewed following labs and imaging studies  CBC: Recent Labs  Lab 01/18/18 0553 01/19/18 0451 01/21/18 0436 01/21/18 0741 01/22/18 0416 01/23/18 0734  WBC 5.1 3.7* 3.1* 2.7* 4.1 4.8  NEUTROABS 2.9 1.5*  --  0.7* 2.4 2.4  HGB 11.3* 11.1* 10.7* 10.6* 10.7* 10.8*  HCT 35.7* 35.4* 34.0* 33.5* 34.4* 34.3*  MCV 86.0 84.7 84.8 84.4  85.4 86.4  PLT 205 250 252 247 249 599   Basic Metabolic Panel: Recent Labs  Lab 01/18/18 0553 01/19/18 0451 01/20/18 0534 01/21/18 0436 01/22/18 0416 01/23/18 0734  NA 137 137  --  138 139 136  K 3.9 3.8  --  3.4* 3.4* 3.5  CL 99* 102  --  98* 99* 99*  CO2 28 27  --  _0 GLUCOSE 98 101*  --  99 88 88  BUN 13 15  --  _1 CREATININE 0.64 0.61  --  0.70 0.59* 0.62  CALCIUM 9.0 8.9  --  9.2 9.1 8.9  MG 1.8 2.0 1.8 1.8 1.6* 1.9   CBG: Recent Labs  Lab 01/22/18 1633 01/22/18 2206 01/23/18 0839 01/23/18 1155 01/23/18 1723    GLUCAP 205* 170* 117* 184* 181*    No results found for this or any previous visit (from the past 240 hour(s)).       Radiology Studies: No results found.      Scheduled Meds: . apixaban  5 mg Oral BID  . arformoterol  15 mcg Nebulization BID  . aspirin  81 mg Oral Daily  . bisacodyl  10 mg Rectal Daily  . budesonide (PULMICORT) nebulizer solution  0.5 mg Nebulization BID  . dextromethorphan-guaiFENesin  1 tablet Oral BID  . folic acid  1 mg Oral Daily  . furosemide  20 mg Oral QPM  . furosemide  40 mg Oral q morning - 10a  . insulin aspart  0-15 Units Subcutaneous TID WC  . insulin aspart  0-5 Units Subcutaneous QHS  . ipratropium-albuterol  3 mL Nebulization BID  . mouth rinse  15 mL Mouth Rinse BID  . metoprolol tartrate  50 mg Oral BID  . pantoprazole  40 mg Oral BID  . polyethylene glycol  17 g Oral BID  . [START ON 01/25/2018] predniSONE  15 mg Oral Q breakfast  . [START ON 01/24/2018] predniSONE  40 mg Oral Q breakfast  . pregabalin  75 mg Oral BID  . traZODone  25 mg Oral QHS  . vitamin C  500 mg Oral Daily   Continuous Infusions: . ampicillin-sulbactam (UNASYN) IV Stopped (01/23/18 1753)     LOS: 12 days     Vernell Leep, MD, FACP, [facility]. [facility] Pager [phone]  If 7PM-7AM, please contact night-coverage [email or web address] Password [facility] 01/23/2018, 5:57 PM

## 2018-01-23 NOTE — Telephone Encounter (Signed)
Irving Burton- please call Pt's wife, Joseph [name]-technically Pt is no longer a patient at our office- they were moving to [place] around Mothers Day and has established with a Dr. Mauricio Po at [facility] will need to contact them.

## 2018-01-24 DIAGNOSIS — E1140 Type 2 diabetes mellitus with diabetic neuropathy, unspecified: Secondary | ICD-10-CM

## 2018-01-24 DIAGNOSIS — K5909 Other constipation: Secondary | ICD-10-CM

## 2018-01-24 LAB — CBC WITH DIFFERENTIAL/PLATELET
BASOS PCT: 0 %
Basophils Absolute: 0 10*3/uL (ref 0.0–0.1)
EOS PCT: 0 %
Eosinophils Absolute: 0 10*3/uL (ref 0.0–0.7)
HCT: 33.4 % — ABNORMAL LOW (ref 39.0–52.0)
HEMOGLOBIN: 10.7 g/dL — AB (ref 13.0–17.0)
LYMPHS PCT: 34 %
Lymphs Abs: 1.8 10*3/uL (ref 0.7–4.0)
MCH: 27.2 pg (ref 26.0–34.0)
MCHC: 32 g/dL (ref 30.0–36.0)
MCV: 85 fL (ref 78.0–100.0)
MONOS PCT: 13 %
Monocytes Absolute: 0.7 10*3/uL (ref 0.1–1.0)
NEUTROS ABS: 2.8 10*3/uL (ref 1.7–7.7)
Neutrophils Relative %: 53 %
Platelets: 249 10*3/uL (ref 150–400)
RBC: 3.93 MIL/uL — ABNORMAL LOW (ref 4.22–5.81)
RDW: 17.1 % — ABNORMAL HIGH (ref 11.5–15.5)
WBC: 5.3 10*3/uL (ref 4.0–10.5)

## 2018-01-24 LAB — BASIC METABOLIC PANEL
ANION GAP: 10 (ref 5–15)
BUN: 17 mg/dL (ref 6–20)
CHLORIDE: 100 mmol/L — AB (ref 101–111)
CO2: 27 mmol/L (ref 22–32)
Calcium: 9 mg/dL (ref 8.9–10.3)
Creatinine, Ser: 0.59 mg/dL — ABNORMAL LOW (ref 0.61–1.24)
GFR calc Af Amer: >60 mL/min (ref >60)
GFR calc non Af Amer: >60 mL/min (ref >60)
Glucose, Bld: 143 mg/dL — ABNORMAL HIGH (ref 65–99)
POTASSIUM: 3.7 mmol/L (ref 3.5–5.1)
Sodium: 137 mmol/L (ref 135–145)

## 2018-01-24 LAB — GLUCOSE, CAPILLARY
GLUCOSE-CAPILLARY: 145 mg/dL — AB (ref 65–99)
Glucose-Capillary: 167 mg/dL — ABNORMAL HIGH (ref 65–99)

## 2018-01-24 LAB — MAGNESIUM: Magnesium: 1.9 mg/dL (ref 1.7–2.4)

## 2018-01-24 MED ORDER — APIXABAN 5 MG PO TABS: 5.0000 mg | ORAL_TABLET | Freq: Two times a day (BID) | ORAL | 0 refills | Status: AC

## 2018-01-24 MED ORDER — AMOXICILLIN-POT CLAVULANATE 875-125 MG PO TABS: 1.0000 | ORAL_TABLET | Freq: Two times a day (BID) | ORAL | 0 refills | Status: AC

## 2018-01-24 MED ORDER — METOPROLOL TARTRATE 50 MG PO TABS: 50.0000 mg | ORAL_TABLET | Freq: Two times a day (BID) | ORAL | 0 refills | Status: AC

## 2018-01-24 NOTE — Progress Notes (Signed)
Judie Bonus to be D/C'd home per MD order.  Discussed prescriptions and follow up appointments with the patient. Prescriptions given to patient, medication list explained in detail. Pt verbalized understanding.  Allergies as of 01/24/2018   No Known Allergies     Medication List    TAKE these medications   albuterol 108 (90 Base) MCG/ACT inhaler Commonly known as:  PROAIR HFA Inhale 2 puffs into the lungs every 4 (four) hours as needed for wheezing or shortness of breath.   amoxicillin-clavulanate 875-125 MG tablet Commonly known as:  AUGMENTIN Take 1 tablet by mouth 2 (two) times daily for 7 days.   apixaban 5 MG Tabs tablet Commonly known as:  ELIQUIS Take 1 tablet (5 mg total) by mouth 2 (two) times daily.   aspirin EC 81 MG tablet Take 81 mg by mouth daily.   atorvastatin 80 MG tablet Commonly known as:  LIPITOR TAKE 1 TABLET (80mg ) BY MOUTH DAILY What changed:    how much to take  how to take this  when to take this  additional instructions   clobetasol cream 0.05 % Commonly known as:  TEMOVATE Apply 1 application topically 2 (two) times daily as needed (rash).   dextromethorphan-guaiFENesin 30-600 MG 12hr tablet Commonly known as:  MUCINEX DM Take 1 tablet by mouth at bedtime.   folic acid 800 MCG tablet Commonly known as:  FOLVITE Take 800 mcg by mouth daily.   furosemide 20 MG tablet Commonly known as:  LASIX Take 20-40 mg by mouth See admin instructions. Take 2 tablets every morning and take 1 tablet at 4 pm   hydrocortisone cream 1 % Apply 1 application topically 3 (three) times daily as needed for itching.   ipratropium-albuterol 0.5-2.5 (3) MG/3ML Soln Commonly known as:  DUONEB Take 3 mLs by nebulization every 4 (four) hours as needed (shortness of breath or wheezing).   LOTEMAX 0.5 % Gel Generic drug:  Loteprednol Etabonate Place 1 drop into both eyes daily.   Melatonin 1 MG Caps Take 9 mg by mouth at bedtime.   metoprolol tartrate 50  MG tablet Commonly known as:  LOPRESSOR Take 1 tablet (50 mg total) by mouth 2 (two) times daily.   nitroGLYCERIN 0.4 MG SL tablet Commonly known as:  NITROSTAT Place 1 tablet (0.4 mg total) under the tongue every 5 (five) minutes as needed for chest pain (up to 3 doses for severe chest pain. If no relief after 3rd dose, proceed to the ED for an evaluation).   OVER THE COUNTER MEDICATION Apply topically as needed. T-Gel Shampoo   oxyCODONE-acetaminophen 7.5-325 MG tablet Commonly known as:  PERCOCET Take 1 tablet by mouth 5 (five) times daily.   OXYGEN Inhale 3 L into the lungs continuous.   pantoprazole 40 MG tablet Commonly known as:  PROTONIX Take 1 tablet (40 mg total) by mouth 2 (two) times daily. What changed:  additional instructions   PERFECT IRON PO Take 50 mg by mouth daily.   potassium chloride 10 MEQ tablet Commonly known as:  K-DUR,KLOR-CON Take 20-40 mEq by mouth See admin instructions. Take 4 tablets every morning and take 2 tablets at 1600   predniSONE 20 MG tablet Commonly known as:  DELTASONE Take 15 mg by mouth daily with breakfast.   pregabalin 75 MG capsule Commonly known as:  LYRICA Take 75 mg by mouth 2 (two) times daily. Take second dose at 1600   sodium chloride 0.65 % Soln nasal spray Commonly known as:  OCEAN  Place 1 spray into both nostrils as needed for congestion.   SUPER B COMPLEX PO Take 1 tablet by mouth daily.   TRELEGY ELLIPTA 100-62.5-25 MCG/INH Aepb Generic drug:  Fluticasone-Umeclidin-Vilant Inhale 1 puff into the lungs daily.   vitamin C 500 MG tablet Commonly known as:  ASCORBIC ACID Take 500 mg by mouth daily.   VITEYES AREDS ADVANCED PO Take 1 tablet by mouth daily.       Vitals:   01/24/18 0744 01/24/18 1008  BP:  121/82  Pulse:  83  Resp:  16  Temp:  98 F (36.7 C)  SpO2: 97% 94%    IV catheter discontinued intact. Site without signs and symptoms of complications. Dressing and pressure applied. Pt denies  pain at this time. No complaints noted.  An After Visit Summary was printed and given to the patient. Patient escorted via WC, and D/C home via private auto.

## 2018-01-24 NOTE — Progress Notes (Signed)
PT Cancellation Note  Patient Details Name: Joseph [name] MRN: [id number] DOB: 07/18/1951   Cancelled Treatment:    Reason Eval/Treat Not Completed: Patient declined, no reason specified. On arrival to room pt reports he is about to leave and will do plenty of walking at home. He does not feel he has time for PT before d/c.  Kallie Locks, PTA Pager [phone] Acute Rehab   Joseph [name] 01/24/2018, 12:45 PM

## 2018-01-24 NOTE — Discharge Instructions (Signed)
Information on my medicine - ELIQUIS (apixaban)  Why was Eliquis prescribed for you? Eliquis was prescribed for you to reduce the risk of forming blood clots that can cause a stroke if you have a medical condition called atrial fibrillation (a type of irregular heartbeat) OR to reduce the risk of a blood clots forming after orthopedic surgery.  What do You need to know about Eliquis ? Take your Eliquis TWICE DAILY - one tablet in the morning and one tablet in the evening with or without food.  It would be best to take the doses about the same time each day.  If you have difficulty swallowing the tablet whole please discuss with your pharmacist how to take the medication safely.  Take Eliquis exactly as prescribed by your doctor and DO NOT stop taking Eliquis without talking to the doctor who prescribed the medication.  Stopping may increase your risk of developing a new clot or stroke.  Refill your prescription before you run out.  After discharge, you should have regular check-up appointments with your healthcare provider that is prescribing your Eliquis.  In the future your dose may need to be changed if your kidney function or weight changes by a significant amount or as you get older.  What do you do if you miss a dose? If you miss a dose, take it as soon as you remember on the same day and resume taking twice daily.  Do not take more than one dose of ELIQUIS at the same time.  Important Safety Information A possible side effect of Eliquis is bleeding. You should call your healthcare provider right away if you experience any of the following: ? Bleeding from an injury or your nose that does not stop. ? Unusual colored urine (red or dark brown) or unusual colored stools (red or black). ? Unusual bruising for unknown reasons. ? A serious fall or if you hit your head (even if there is no bleeding).  Some medicines may interact with Eliquis and might increase your risk of bleeding  or clotting while on Eliquis. To help avoid this, consult your healthcare provider or pharmacist prior to using any new prescription or non-prescription medications, including herbals, vitamins, non-steroidal anti-inflammatory drugs (NSAIDs) and supplements.  This website has more information on Eliquis (apixaban): [email or web address].  Additional discharge instructions  Please get your medications reviewed and adjusted by your Primary MD.  Please request your Primary MD to go over all Hospital Tests and Procedure/Radiological results at the follow up, please get all Hospital records sent to your Prim MD by signing hospital release before you go home.  If you had Pneumonia of Lung problems at the Hospital: Please get a 2 view Chest X ray done in 6-8 weeks after hospital discharge or sooner if instructed by your Primary MD.  If you have Congestive Heart Failure: Please call your Cardiologist or Primary MD anytime you have any of the following symptoms:  1) 3 pound weight gain in 24 hours or 5 pounds in 1 week  2) shortness of breath, with or without a dry hacking cough  3) swelling in the hands, feet or stomach  4) if you have to sleep on extra pillows at night in order to breathe  Follow cardiac low salt diet and 1.5 lit/day fluid restriction.  If you have diabetes Accuchecks 4 times/day, Once in AM empty stomach and then before each meal. Log in all results and show them to your primary doctor at your next visit. If  any glucose reading is under 80 or above 300 call your primary MD immediately.  If you have Seizure/Convulsions/Epilepsy: Please do not drive, operate heavy machinery, participate in activities at heights or participate in high speed sports until you have seen by Primary MD or a Neurologist and advised to do so again.  If you had Gastrointestinal Bleeding: Please ask your Primary MD to check a complete blood count within one week of discharge or at your next visit. Your  endoscopic/colonoscopic biopsies that are pending at the time of discharge, will also need to followed by your Primary MD.  Get Medicines reviewed and adjusted. Please take all your medications with you for your next visit with your Primary MD  Please request your Primary MD to go over all hospital tests and procedure/radiological results at the follow up, please ask your Primary MD to get all Hospital records sent to his/her office.  If you experience worsening of your admission symptoms, develop shortness of breath, life threatening emergency, suicidal or homicidal thoughts you must seek medical attention immediately by calling 911 or calling your MD immediately  if symptoms less severe.  You must read complete instructions/literature along with all the possible adverse reactions/side effects for all the Medicines you take and that have been prescribed to you. Take any new Medicines after you have completely understood and accpet all the possible adverse reactions/side effects.   Do not drive or operate heavy machinery when taking Pain medications.   Do not take more than prescribed Pain, Sleep and Anxiety Medications  Special Instructions: If you have smoked or chewed Tobacco  in the last 2 yrs please stop smoking, stop any regular Alcohol  and or any Recreational drug use.  Wear Seat belts while driving.  Please note You were cared for by a hospitalist during your hospital stay. If you have any questions about your discharge medications or the care you received while you were in the hospital after you are discharged, you can call the unit and asked to speak with the hospitalist on call if the hospitalist that took care of you is not available. Once you are discharged, your primary care physician will handle any further medical issues. Please note that NO REFILLS for any discharge medications will be authorized once you are discharged, as it is imperative that you return to your primary care  physician (or establish a relationship with a primary care physician if you do not have one) for your aftercare needs so that they can reassess your need for medications and monitor your lab values.  You can reach the hospitalist office at phone [phone] or fax [phone]   If you do not have a primary care physician, you can call [phone] for a physician referral.   Bacteremia Bacteremia is the presence of bacteria in the blood. When bacteria enter the bloodstream, they can cause a life-threatening reaction called sepsis, which is a medical emergency. Bacteremia can spread to other parts of the body, including the heart, joints, and brain. What are the causes? This condition is caused by bacteria that get into the blood. Bacteria can enter the blood:  From a skin infection or a cut on your skin.  During an episode of pneumonia.  From an infection in your stomach or intestine (gastrointestinal infection).  From an infection in your bladder or urinary system (urinary tract infection).  During a dental or medical procedure.  After you brush your teeth so hard that your gums bleed.  When a bacterial  infection in another part of the body spreads to the blood.  Through a dirty needle.  What increases the risk? This condition is more likely to develop in:  Children.  Elderly adults.  People who have a long-lasting (chronic) disease or medical condition.  People who have an artificial joint or heart valve.  People who have heart valve disease.  People who have a tube, such as a catheter or IV tube, that has been inserted for a medical treatment.  People who have a weak body defense system (immune system).  People who use IV drugs.  What are the signs or symptoms? Symptoms of this condition include:  Fever.  Chills.  A racing heart.  Shortness of breath.  Dizziness.  Weakness.  Confusion.  Nausea or vomiting.  Diarrhea.  In some cases, there are no  symptoms. Bacteremia that has spread to the other parts of the body may cause symptoms in those areas. How is this diagnosed? This condition may be diagnosed with a physical exam and tests, such as:  A complete blood count (CBC). This test looks for signs of infection.  Blood cultures. These look for bacteria in your blood.  Tests of any tubes that you may have inserted into your body, such as an IV tube or urinary catheter. These tests look for a source of infection.  Urine tests including urine cultures. These look for bacteria in the urine that could be a source of infection.  Imaging tests, such as an X-ray, CT scan, MRI, or heart ultrasound. These look for a source of infection in other parts of the body, such as the lungs, heart valves, or joints.  How is this treated? This condition may be treated with:  Antibiotic medicines given through an IV infusion. Depending on the source of infection, antibiotics may be needed for several weeks. At first, an antibiotic may be given to kill most types of blood bacteria. If your test results show that a certain kind of bacteria is causing problems, the antibiotic may be changed to kill only the bacteria that are causing problems.  Antibiotics taken by mouth.  IV fluids to support the body as you fight the infection.  Removing any catheter or device that could be a source of infection.  Blood pressure and breathing support, if you have sepsis.  Surgery to control the source or spread of infection, such as: ? Removing an infected implanted device. ? Removing infected tissue or an abscess.  This condition is usually treated at a hospital. If you are treated at home, you may need to come back for medicines, blood tests, and evaluation. This is important. Follow these instructions at home:  Take over-the-counter and prescription medicines only as told by your health care provider.  If you were prescribed an antibiotic, take it as told by  your health care provider. Do not stop taking the antibiotic even if you start to feel better.  Rest until your condition is under control.  Drink enough fluid to keep your urine clear or pale yellow.  Do not smoke. If you need help quitting, ask your health care provider.  Keep all follow-up visits as told by your health care provider. This is important. How is this prevented?  Get the vaccinations that your health care provider recommends.  Clean and cover any scrapes or cuts.  Take good care of your skin. This includes regular bathing and moisturizing.  Wash your hands often.  Practice good oral hygiene. Brush your teeth  two times a day and floss regularly. Get help right away if:  You have pain.  You have a fever.  You have trouble breathing.  Your skin becomes blotchy, pale, or clammy.  You develop confusion, dizziness, or weakness.  You develop diarrhea.  You develop any new symptoms after treatment. Summary  Bacteremia is the presence of bacteria in the blood. When bacteria enter the bloodstream, they can cause a life- threatening reaction called sepsis.  Children and elderly adults are at increased risk of bacteremia. Other risk factors include having a long-lasting (chronic) disease or a weak immune system, having an artificial joint or heart valve, having heart valve disease, having tubes that were inserted in the body for medical treatment, or using IV drugs.  Some symptoms of bacteremia include fever, chills, shortness of breath, confusion, nausea or vomiting, and diarrhea.  Tests may be done to diagnose a source of infection that led to bacteremia. These tests may include blood tests, urine tests, and imaging tests.  Bacteremia is usually treated with antibiotics, usually in a hospital. This information is not intended to replace advice given to you by your health care provider. Make sure you discuss any questions you have with your health care  provider. Document Released: 05/26/2006 Document Revised: 07/09/2016 Document Reviewed: 07/09/2016 Elsevier Interactive Patient Education  [employer].

## 2018-01-24 NOTE — Telephone Encounter (Signed)
See D/C summary, pt plans to delay the move to [facility] for now. I spoke w/ Britta Mccreedy, pt's wife. They are going home today, Gala Romney has a biopsy  planned for Monday at [facility] and plans to go to a  hotel near [facility] for the next several days. Because he has several appointments with multiple specialist, we agreed Britta Mccreedy will call next week, let me know how he is doing and will make an appointment here if needed.

## 2018-01-24 NOTE — Discharge Summary (Signed)
Physician Discharge Summary  EVERETT EHRLER HWE:[id number] DOB: [birth year]  PCP: Sharyne Richters, MD  Admit date: 01/11/2018 Discharge date: 01/24/2018  Recommendations for Outpatient Follow-up:  1. Dr. Kathlene November, PCP in 1 week.  Patient plans to have lab work done through his hematologist at [facility] which can be followed up (CBC with differential & BMP). 2. Dr. Karrie Doffing, New PCP: Patient bought a new house in [place] and were planning to move there.  However this has been delayed due to current health issues.  They did see Dr. Sofie Hartigan on one occasion.  However until his health condition is stabilized, patient plans to stay in the [place] area and follow-up with Dr. Larose Kells.  Patient states that he has communicated this with Dr. Ethel Rana office. 3. Dr. Su Grand, Hematology at [facility].  Post discharge from [facility] today, patient and spouse have made arrangements to go stay in a hotel near [facility] so that he can go to [facility] in case he were to deteriorate between today and his appointment with them for labs and bone marrow biopsy on 01/26/2018.  To be seen with repeat labs (CBC with differential & BMP).  I have discussed plans for discharge today with his hematology team already. 4. Dr. Deniece Ree, Rheumatology: Patient is to call for appointment. 5. Dr. Chesley Mires, Pulmonology to be seen in 2 weeks with repeat imaging.  Chest x-ray versus repeat CT chest. 6. Dr. Minus Breeding, Cardiology on 02/04/2018 at 10:20 AM.  Home Health: PT, OT and RN. Equipment/Devices: None.  Patient was already on home oxygen 3 L/min continuously.  Discharge Condition: Improved and stable CODE STATUS: Full. Diet recommendation: Heart healthy diet.  Discharge Diagnoses:  Principal Problem:   Bacteremia due to Enterococcus Active Problems:   HCAP (healthcare-associated pneumonia)   Sepsis ([employer] [place])   Bacteremia due to clostridium perfringens   Rheumatoid  arthritis of hand ([place])   Other constipation   Brief Summary: 67 year old male with PMH of advanced rheumatoid arthritis on chronic prednisone 15 mg daily, COPD/bullous emphysema, chronic hypoxic respiratory failure on home oxygen 3 L/min, idiopathic neutropenia (?  Associated with rheumatoid arthritis) awaiting evaluation by [facility] hematology, CAD, GERD, gastroparesis with recurrent aspiration, diet controlled DM 2, recently hospitalized 4/29-5/3 with healthcare associated pneumonia/infected pulmonary bullae at which time he was discharged on Augmentin and then developed severe diarrhea, admitted again 5/4-5/17 with neutropenia, fevers (T-max 102.8) with chest x-ray showing left upper lobe thick-walled cavitary lesion with increased size and surrounding airspace disease, presented again 5/19 with altered mental status, fever and diarrhea.  Temperature on arrival 55 F.  Upon arrival patient was hypotensive, tachycardic, lactic acid 3.64.  He was admitted to ICU by CCM for septic shock secondary to polymicrobial bacteremia and possible left upper lobe pneumonia.  After he was stabilized, care was transferred to hospitalist service and to the floor on 01/14/2018.  Hospital course complicated by A. fib with RVR.  Assessment and plan:  Sepsis due to polymicrobial bacteremia (enterococcus, Staphylococcus hemolyticus & Clostridium perfringens): As per ID, thought to be due to got microflora translocation in the setting of nausea, vomiting and diarrhea.  Sepsis resolved.  Surveillance blood cultures from 5/20 and 5/21 are negative.  Had TTE but did not pursue TEE since he had quick resolution of symptoms.  ID recommended total 14 days of antibiotics which he completed on day of discharge.  Patient had requested to be transferred to [facility]  but due to lack of beds, completed inpatient care here.  Has remained stable for several days now.  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  Aggressive pulmonary toilet.  As per pulmonology, patient has resolving pneumonia/bronchitis, atelectasis and have increased use of flutter valve and incentive spirometry.  As per ID follow-up, recommended additional week of Augmentin 875 twice daily after completion of IV antibiotics.  Outpatient follow-up with his primary pulmonologist with repeat imaging Chest XRay Vs CT Chest in a couple of weeks.  Improved and stable.  Acute on chronic hypoxic respiratory failure: Secondary to pneumonia and atelectasis. 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  Patient follows with Dr. Halford Chessman, Pulmonology.  Stable and back on home level of oxygen.    Neutropenia: Unclear etiology.  Supposed to follow-up with [facility] hematology for bone marrow biopsy.  Received a dose of Granix on 5/21 with appropriate response.  Felty syndrome felt to be unlikely.  He developed neutropenia again with [place] 700 on 5/29.  I discussed with [facility] Hematology team/Jonathan on 5/29 who discussed with patient's primary Hematologist and advised Granix 300 mcg daily until [place] equal to or greater than 2K.    Patient received an additional dose of Granix 4 days ago and since then his WBC count has normalized and ANC has been >2 K.  As stated above, patient plans to follow-up with his hematologist on 6/3 for further evaluation including lab work and bone marrow biopsy.  Anemia of chronic disease: Stable.  Outpatient follow-up with hematology for further evaluation.  Hypokalemia/hypomagnesemia: Replaced.  Continue home potassium supplements while on diuretics.  Chronic A. fib with RVR: Occurred briefly while in the ICU on 5/19.  Underwent ablation in 2018 by Dr. Lovena Le for atrial flutter.  Case was discussed with EP cardiology on 5/24 and metoprolol and apixaban were started (since anticoagulation and rate control agents were to be initiated if A. fib reoccurred).  Recent echo showed EF 60%.  Currently in sinus rhythm.  Patient has  follow-up appointment with Cardiology soon at which time can reassess if he needs to be on aspirin while on Eliquis.  CAD status post CABG 2007: No anginal symptoms.  During upcoming Cardiology visit, assess need for aspirin since he is on Eliquis.  Chronic diastolic CHF: TTE 2/63/7858: LVEF 60-65%.  Compensated.    Continue Lasix and potassium supplements.  Periodically follow BMP.  Rheumatoid arthritis on chronic prednisone:  Patient had transient mild flare with pain in his neck, bilateral shoulders and bilateral wrists.  Oral prednisone dose was temporarily increased to 40 mg daily for 2 days with resolution of his symptoms.  He will be discharged to continue prior home dose of prednisone 15 mg daily.  Diarrhea: Felt to be the cause of his polymicrobial bloodstream infection.  Recent C. difficile PCR 5/4 negative.  Resolved.  Hyperlipidemia: Statins.  Depression and insomnia: Stable.  Constipation: Resolved.  DM2:  A1c 6.4 on 01/08/2018 suggesting good control.  Diet controlled.  Mild uncontrolled in the hospital while his steroids were transiently increased.  Close outpatient follow-up.   Consultants:  Pulmonology Infectious disease  Procedures:  None   Discharge Instructions  Discharge Instructions    (HEART FAILURE PATIENTS) Call MD:  Anytime you have any of the following symptoms: 1) 3 pound weight gain in 24 hours or 5 pounds in 1 week 2) shortness of breath, with or without a dry hacking cough 3) swelling in the hands, feet or stomach 4) if you  have to sleep on extra pillows at night in order to breathe.   Complete by:  As directed    Call MD for:  difficulty breathing, headache or visual disturbances   Complete by:  As directed    Call MD for:  extreme fatigue   Complete by:  As directed    Call MD for:  persistant dizziness or light-headedness   Complete by:  As directed    Call MD for:  persistant nausea and vomiting   Complete by:  As directed    Call  MD for:  severe uncontrolled pain   Complete by:  As directed    Call MD for:  temperature >100.4   Complete by:  As directed    Diet - low sodium heart healthy   Complete by:  As directed    Diet Carb Modified   Complete by:  As directed    Increase activity slowly   Complete by:  As directed        Medication List    TAKE these medications   albuterol 108 (90 Base) MCG/ACT inhaler Commonly known as:  PROAIR HFA Inhale 2 puffs into the lungs every 4 (four) hours as needed for wheezing or shortness of breath.   amoxicillin-clavulanate 875-125 MG tablet Commonly known as:  AUGMENTIN Take 1 tablet by mouth 2 (two) times daily for 7 days.   apixaban 5 MG Tabs tablet Commonly known as:  ELIQUIS Take 1 tablet (5 mg total) by mouth 2 (two) times daily.   aspirin EC 81 MG tablet Take 81 mg by mouth daily.   atorvastatin 80 MG tablet Commonly known as:  LIPITOR TAKE 1 TABLET (63m) BY MOUTH DAILY What changed:    how much to take  how to take this  when to take this  additional instructions   clobetasol cream 0.05 % Commonly known as:  TEMOVATE Apply 1 application topically 2 (two) times daily as needed (rash).   dextromethorphan-guaiFENesin 30-600 MG 12hr tablet Commonly known as:  MUCINEX DM Take 1 tablet by mouth at bedtime.   folic acid 8081MCG tablet Commonly known as:  FOLVITE Take 800 mcg by mouth daily.   furosemide 20 MG tablet Commonly known as:  LASIX Take 20-40 mg by mouth See admin instructions. Take 2 tablets every morning and take 1 tablet at 4 pm   hydrocortisone cream 1 % Apply 1 application topically 3 (three) times daily as needed for itching.   ipratropium-albuterol 0.5-2.5 (3) MG/3ML Soln Commonly known as:  DUONEB Take 3 mLs by nebulization every 4 (four) hours as needed (shortness of breath or wheezing).   LOTEMAX 0.5 % Gel Generic drug:  Loteprednol Etabonate Place 1 drop into both eyes daily.   Melatonin 1 MG Caps Take 9 mg by  mouth at bedtime.   metoprolol tartrate 50 MG tablet Commonly known as:  LOPRESSOR Take 1 tablet (50 mg total) by mouth 2 (two) times daily.   nitroGLYCERIN 0.4 MG SL tablet Commonly known as:  NITROSTAT Place 1 tablet (0.4 mg total) under the tongue every 5 (five) minutes as needed for chest pain (up to 3 doses for severe chest pain. If no relief after 3rd dose, proceed to the ED for an evaluation).   OVER THE COUNTER MEDICATION Apply topically as needed. T-Gel Shampoo   oxyCODONE-acetaminophen 7.5-325 MG tablet Commonly known as:  PERCOCET Take 1 tablet by mouth 5 (five) times daily.   OXYGEN Inhale 3 L into the lungs continuous.  pantoprazole 40 MG tablet Commonly known as:  PROTONIX Take 1 tablet (40 mg total) by mouth 2 (two) times daily. What changed:  additional instructions   PERFECT IRON PO Take 50 mg by mouth daily.   potassium chloride 10 MEQ tablet Commonly known as:  K-DUR,KLOR-CON Take 20-40 mEq by mouth See admin instructions. Take 4 tablets every morning and take 2 tablets at 1600   predniSONE 20 MG tablet Commonly known as:  DELTASONE Take 15 mg by mouth daily with breakfast.   pregabalin 75 MG capsule Commonly known as:  LYRICA Take 75 mg by mouth 2 (two) times daily. Take second dose at 1600   sodium chloride 0.65 % Soln nasal spray Commonly known as:  OCEAN Place 1 spray into both nostrils as needed for congestion.   SUPER B COMPLEX PO Take 1 tablet by mouth daily.   TRELEGY ELLIPTA 100-62.5-25 MCG/INH Aepb Generic drug:  Fluticasone-Umeclidin-Vilant Inhale 1 puff into the lungs daily.   vitamin C 500 MG tablet Commonly known as:  ASCORBIC ACID Take 500 mg by mouth daily.   VITEYES AREDS ADVANCED PO Take 1 tablet by mouth daily.      Follow-up Information    Minus Breeding, MD Follow up on 02/04/2018.   Specialty:  Cardiology Why:  at 10:20AM Contact information: [address] [place] [address] [phone]         Sharyne Richters, MD. Schedule an appointment as soon as possible for a visit today.   Specialty:  Internal Medicine Why:  When able. Contact information: [address] [place] [address] [phone]        Golda Acre, [facility] Follow up.   Specialty:  [place] Why:  they wiall you to set up visit Contact information: [place] [address] [phone]        Colon Branch, MD. Schedule an appointment as soon as possible for a visit in 1 week(s).   Specialty:  Internal Medicine Why:  Patient stated that they were planning to move to [facility] but are held back due to health issues and hence plan to continue follow-up with Dr. Larose Kells. Contact information: [address] [place] [address] [phone]        Su Grand, MD Follow up on 01/26/2018.   Specialty:  Internal Medicine Why:  Patient and spouse indicate that they have an appointment for Monday, 01/26/2018 for lab work and bone marrow biopsy. Contact information: [address] [facility] [id number] [place] [address] [phone]        Chesley Mires, MD. Schedule an appointment as soon as possible for a visit in 2 week(s).   Specialty:  Pulmonary Disease Contact information: [address] [address] [phone]        Ted Mcalpine, MD. Schedule an appointment as soon as possible for a visit.   Specialty:  Internal Medicine Why:  Follow up re your Rheumatoid Arthritis. Contact information: [place] [address] [place] [address] [phone]          No Known Allergies    Procedures/Studies: Dg Chest 2 View  Result Date: 01/21/2018 CLINICAL DATA:  Cough, pneumonia. EXAM: CHEST - 2 VIEW COMPARISON:  CT scan of Jan 11, 2018.  Radiograph of Jan 11, 2018. FINDINGS: Stable cardiomediastinal silhouette. Status post coronary artery bypass graft. Right  lung is clear. Stable cavitary lesion  is seen in left upper lobe concerning for infection of bulla. Increased amount of fluid is noted within the bulla. Bony thorax is unremarkable. IMPRESSION: Stable cavitary lesion seen in left upper lobe consistent with bulla with superimposed infection. Electronically Signed   By: Marijo Conception, M.D.   On: 01/21/2018 11:54   Ct Chest W Contrast  Result Date: 01/11/2018 CLINICAL DATA:  Acute onset of generalized weakness and fever. Decreased oral intake. EXAM: CT CHEST WITH CONTRAST TECHNIQUE: Multidetector CT imaging of the chest was performed during intravenous contrast administration. CONTRAST:  145m OMNIPAQUE IOHEXOL 300 MG/ML  SOLN COMPARISON:  Chest radiograph performed earlier today at 12:22 a.m., and CT of the chest performed 12/22/2017 FINDINGS: Cardiovascular: The heart is normal in size. Diffuse coronary artery calcifications are seen. Scattered calcification noted along the thoracic aorta and proximal great vessels. The descending thoracic aorta is borderline normal in caliber. Mediastinum/Nodes: The patient is status post median sternotomy. Visualized mediastinal nodes remain borderline normal in size. No pericardial effusion is identified. The thyroid gland is unremarkable. No axillary lymphadenopathy is seen. 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 Peripheral honeycombing is noted bilaterally. Mild interstitial prominence may reflect mild interstitial edema. No definite pleural effusion or pneumothorax is seen. Upper Abdomen: The visualized portions of the liver and spleen are unremarkable. The patient is status post cholecystectomy, with a clip noted at the gallbladder fossa. The visualized portions of the spleen are unremarkable. A tiny hiatal hernia is noted. Musculoskeletal: No acute osseous abnormalities are  identified. The visualized musculature is unremarkable in appearance. IMPRESSION: 1. Multiple cavitations noted at the left lung apex, with new air-fluid levels, concerning for atypical infection superimposed on chronic bullous disease. 2. Mild interstitial prominence may reflect mild interstitial edema. 3. Diffuse coronary artery calcifications seen. 4. Tiny hiatal hernia noted. Electronically Signed   By: JGarald BaldingM.D.   On: 01/11/2018 04:23   Ct Abdomen Pelvis W Contrast  Result Date: 01/13/2018 CLINICAL DATA:  Painless diarrhea EXAM: CT ABDOMEN AND PELVIS WITH CONTRAST TECHNIQUE: Multidetector CT imaging of the abdomen and pelvis was performed using the standard protocol following bolus administration of intravenous contrast. CONTRAST:  1064mOMNIPAQUE IOHEXOL 300 MG/ML  SOLN COMPARISON:  08/14/2017 FINDINGS: LOWER CHEST: Small left pleural effusion. Emphysema and bibasilar atelectasis. HEPATOBILIARY: Normal hepatic contours and density. No intra- or extrahepatic biliary dilatation. Status post cholecystectomy. PANCREAS: Normal parenchymal contours without ductal dilatation. No peripancreatic fluid collection. SPLEEN: Normal. ADRENALS/URINARY TRACT: --Adrenal glands: Normal. --Right kidney/ureter: Multiple nonobstructing renal stones measuring up 2 7 mm. No hydronephrosis. No ureterolithiasis. --Left kidney/ureter: Tiny non-obstructing 1-2 mm stone at the lower pole. No hydronephrosis. --Urinary bladder: Normal for degree of distention STOMACH/BOWEL: --Stomach/Duodenum: No hiatal hernia or other gastric abnormality. Normal duodenal course. --Small bowel: No dilatation or inflammation. --Colon: There is a fecal management system in the rectum. There is sigmoid diverticulosis without acute inflammation. The remainder of the colon is normal. --Appendix: Normal. VASCULAR/LYMPHATIC: Atherosclerotic calcification is present within the non-aneurysmal abdominal aorta, without hemodynamically significant  stenosis. The portal vein, splenic vein, superior mesenteric vein and IVC are patent. No abdominal or pelvic lymphadenopathy. REPRODUCTIVE: Normal prostate and seminal vesicles. MUSCULOSKELETAL. No bony spinal canal stenosis or focal osseous abnormality. OTHER: None. IMPRESSION: 1. No acute abnormality of the abdomen or pelvis. 2. Small left pleural effusion. 3. Bilateral nonobstructive nephrolithiasis. 4. Aortic Atherosclerosis (ICD10-I70.0) and Emphysema (ICD10-J43.9). Electronically  Signed   By: Ulyses Jarred M.D.   On: 01/13/2018 17:57   Dg Chest Port 1 View  Result Date: 01/11/2018 CLINICAL DATA:  Sepsis, fever and weakness.  Dyspnea x1 day. EXAM: PORTABLE CHEST 1 VIEW COMPARISON:  Chest CT 12/22/2017, CXR 01/02/2018 FINDINGS: Stable cardiomegaly with post CABG change and aortic atherosclerosis. Redemonstration of left apical bullae, the largest is partially filled with fluid and thick-walled compatible with an infected bulla. This is slightly smaller in appearance than on prior more cavitary in appearance than recent comparison chest radiograph. Subsegmental scarring is seen in the right upper lobe and mid lung. Probable trace right effusion. No acute osseous abnormality. IMPRESSION: 1. Cardiomegaly with aortic atherosclerosis and post CABG change. 2. Thick-walled left upper lobe bulla compatible with an infected bulla is redemonstrated though slightly more cavitary in appearance and less opacified than on prior comparison. This would suggest some partial clearing of fluid within. Electronically Signed   By: Ashley Royalty M.D.   On: 01/11/2018 00:46   Dg Abd Portable 1v  Result Date: 01/18/2018 CLINICAL DATA:  Acute generalized abdominal pain. EXAM: PORTABLE ABDOMEN - 1 VIEW COMPARISON:  Radiograph of August 14, 2017. FINDINGS: No abnormal bowel dilatation is noted. Moderate amount of stool seen in the right colon. No radio-opaque calculi or other significant radiographic abnormality are seen. Status  post cholecystectomy. IMPRESSION: Moderate stool burden.  No evidence of bowel obstruction or ileus. Electronically Signed   By: Marijo Conception, M.D.   On: 01/18/2018 10:14     Subjective: "I feel great".  Indicates that his bilateral wrist pain has significantly improved and he feels back to baseline.  No dyspnea, chest pain, cough reported.  Anxious for discharge.  No other complaints reported.  As per RN, no acute issues noted.  Discharge Exam:  Vitals:   01/24/18 0513 01/24/18 0737 01/24/18 0744 01/24/18 1008  BP: 123/74   121/82  Pulse: 64   83  Resp: 16   16  Temp: 98.3 F (36.8 C)   98 F (36.7 C)  TempSrc: Oral   Oral  SpO2: 94% 95% 97% 94%  Weight:      Height:        General exam: Pleasant middle-aged male, small built and frail, lying comfortably propped up in bed without distress.   Respiratory system: Clear to auscultation without wheezing or rhonchi. Respiratory effort normal.   Cardiovascular system: S1 & S2 heard, RRR. No JVD, murmurs, rubs, gallops or clicks. No pedal edema.  Telemetry personally reviewed: Sinus rhythm Gastrointestinal system: Abdomen is nondistended, soft and nontender. No organomegaly or masses felt. Normal bowel sounds heard.   Central nervous system: Alert and oriented. No focal neurological deficits.  Extremities: Symmetric 5 x 5 power.  Bilateral wrists resolved increased warmth and tenderness.  No painful range of movements.  Chronic deformities of his hands and fingers related to chronic rheumatoid arthritis.  Skin: No rashes, lesions or ulcers Psychiatry: Judgement and insight appear normal. Mood & affect appropriate.      The results of significant diagnostics from this hospitalization (including imaging, microbiology, ancillary and laboratory) are listed below for reference.     Microbiology: No results found for this or any previous visit (from the past 240 hour(s)).   Labs: CBC: Recent Labs  Lab 01/19/18 0451 01/21/18 0436  01/21/18 0741 01/22/18 0416 01/23/18 0734 01/24/18 0426  WBC 3.7* 3.1* 2.7* 4.1 4.8 5.3  NEUTROABS 1.5*  --  0.7* 2.4 2.4 2.8  HGB  11.1* 10.7* 10.6* 10.7* 10.8* 10.7*  HCT 35.4* 34.0* 33.5* 34.4* 34.3* 33.4*  MCV 84.7 84.8 84.4 85.4 86.4 85.0  PLT 250 252 247 249 232 545   Basic Metabolic Panel: Recent Labs  Lab 01/19/18 0451 01/20/18 0534 01/21/18 0436 01/22/18 0416 01/23/18 0734 01/24/18 0426  NA 137  --  138 139 136 137  K 3.8  --  3.4* 3.4* 3.5 3.7  CL 102  --  98* 99* 99* 100*  CO2 27  --  '28 29 24 27  ' GLUCOSE 101*  --  99 88 88 143*  BUN 15  --  '18 15 13 17  ' CREATININE 0.61  --  0.70 0.59* 0.62 0.59*  CALCIUM 8.9  --  9.2 9.1 8.9 9.0  MG 2.0 1.8 1.8 1.6* 1.9 1.9   BNP (last 3 results) Recent Labs    12/28/17 0331  BNP 143.1*   CBG: Recent Labs  Lab 01/23/18 1155 01/23/18 1723 01/23/18 2352 01/24/18 0818 01/24/18 1219  GLUCAP 184* 181* 223* 145* 167*    Urinalysis    Component Value Date/Time   COLORURINE YELLOW 01/11/2018 0225   APPEARANCEUR CLEAR 01/11/2018 0225   LABSPEC 1.013 01/11/2018 0225   PHURINE 5.0 01/11/2018 0225   GLUCOSEU NEGATIVE 01/11/2018 0225   HGBUR NEGATIVE 01/11/2018 0225   HGBUR negative 04/12/2008 1103   BILIRUBINUR NEGATIVE 01/11/2018 0225   KETONESUR NEGATIVE 01/11/2018 0225   PROTEINUR NEGATIVE 01/11/2018 0225   UROBILINOGEN 0.2 10/17/2012 1236   NITRITE NEGATIVE 01/11/2018 0225   LEUKOCYTESUR NEGATIVE 01/11/2018 0225   I have been discussing patient's care in detail with both the patient and his spouse who has been at the bedside every day.  I have updated care and answered all their questions to their satisfaction.  Patient's spouse has been making detailed notes.   Time coordinating discharge: 60 minutes  SIGNED:  Vernell Leep, MD, FACP, [facility]. [facility] Pager [phone]  If 7PM-7AM, please contact night-coverage [email or web address] Password [facility] 01/24/2018, 12:39 PM

## 2018-01-24 NOTE — Progress Notes (Signed)
NCM called and notified discharge liaison with [facility] and notified of patient discharge order for today 01/24/18.

## 2018-01-26 ENCOUNTER — Other Ambulatory Visit: Admit: 2018-01-26 | Discharge: 2018-01-27 | Payer: MEDICARE

## 2018-01-26 ENCOUNTER — Ambulatory Visit: Admit: 2018-01-26 | Discharge: 2018-01-27 | Payer: MEDICARE

## 2018-01-26 DIAGNOSIS — D708 Other neutropenia: Principal | ICD-10-CM

## 2018-01-26 DIAGNOSIS — D709 Neutropenia, unspecified: Principal | ICD-10-CM

## 2018-01-26 DIAGNOSIS — E1142 Type 2 diabetes mellitus with diabetic polyneuropathy: Secondary | ICD-10-CM | POA: Diagnosis not present

## 2018-01-26 DIAGNOSIS — J439 Emphysema, unspecified: Secondary | ICD-10-CM | POA: Diagnosis not present

## 2018-01-26 DIAGNOSIS — M059 Rheumatoid arthritis with rheumatoid factor, unspecified: Secondary | ICD-10-CM | POA: Diagnosis not present

## 2018-01-26 LAB — PATHOLOGIST SMEAR REVIEW

## 2018-01-27 MED ORDER — OXYCODONE-ACETAMINOPHEN 7.5 MG-325 MG TABLET
ORAL_TABLET | Freq: Four times a day (QID) | ORAL | 0 refills | 0.00000 days | Status: CP | PRN
Start: 2018-01-27 — End: 2018-04-02

## 2018-01-28 ENCOUNTER — Ambulatory Visit: Admit: 2018-01-28 | Discharge: 2018-01-28 | Payer: MEDICARE

## 2018-01-28 ENCOUNTER — Ambulatory Visit
Admit: 2018-01-28 | Discharge: 2018-01-28 | Payer: MEDICARE | Attending: Hematology & Oncology | Primary: Hematology & Oncology

## 2018-01-28 DIAGNOSIS — M3500 Sicca syndrome, unspecified: Secondary | ICD-10-CM

## 2018-01-28 DIAGNOSIS — M05771 Rheumatoid arthritis with rheumatoid factor of right ankle and foot without organ or systems involvement: Secondary | ICD-10-CM

## 2018-01-28 DIAGNOSIS — M059 Rheumatoid arthritis with rheumatoid factor, unspecified: Secondary | ICD-10-CM

## 2018-01-28 DIAGNOSIS — D61818 Other pancytopenia: Secondary | ICD-10-CM

## 2018-01-28 DIAGNOSIS — D709 Neutropenia, unspecified: Principal | ICD-10-CM

## 2018-01-28 DIAGNOSIS — M05772 Rheumatoid arthritis with rheumatoid factor of left ankle and foot without organ or systems involvement: Secondary | ICD-10-CM

## 2018-01-28 DIAGNOSIS — D708 Other neutropenia: Secondary | ICD-10-CM

## 2018-01-28 DIAGNOSIS — M069 Rheumatoid arthritis, unspecified: Principal | ICD-10-CM

## 2018-01-28 DIAGNOSIS — I4891 Unspecified atrial fibrillation: Secondary | ICD-10-CM | POA: Diagnosis not present

## 2018-01-28 DIAGNOSIS — Z9981 Dependence on supplemental oxygen: Secondary | ICD-10-CM | POA: Diagnosis not present

## 2018-01-28 DIAGNOSIS — Z951 Presence of aortocoronary bypass graft: Secondary | ICD-10-CM | POA: Diagnosis not present

## 2018-01-28 DIAGNOSIS — M810 Age-related osteoporosis without current pathological fracture: Secondary | ICD-10-CM | POA: Diagnosis not present

## 2018-01-28 DIAGNOSIS — E785 Hyperlipidemia, unspecified: Secondary | ICD-10-CM | POA: Diagnosis not present

## 2018-01-28 DIAGNOSIS — E1142 Type 2 diabetes mellitus with diabetic polyneuropathy: Secondary | ICD-10-CM | POA: Diagnosis not present

## 2018-01-28 DIAGNOSIS — J449 Chronic obstructive pulmonary disease, unspecified: Secondary | ICD-10-CM | POA: Diagnosis not present

## 2018-01-28 DIAGNOSIS — I509 Heart failure, unspecified: Secondary | ICD-10-CM | POA: Diagnosis not present

## 2018-01-29 LAB — FUNGUS CULTURE RESULT

## 2018-01-29 LAB — FUNGUS CULTURE WITH STAIN

## 2018-01-29 LAB — FUNGAL ORGANISM REFLEX

## 2018-01-30 ENCOUNTER — Ambulatory Visit: Admit: 2018-01-30 | Discharge: 2018-01-31 | Payer: MEDICARE | Attending: Rheumatology | Primary: Rheumatology

## 2018-01-30 DIAGNOSIS — M05741 Rheumatoid arthritis with rheumatoid factor of right hand without organ or systems involvement: Secondary | ICD-10-CM

## 2018-01-30 DIAGNOSIS — M3500 Sicca syndrome, unspecified: Secondary | ICD-10-CM

## 2018-01-30 DIAGNOSIS — M069 Rheumatoid arthritis, unspecified: Secondary | ICD-10-CM

## 2018-01-30 DIAGNOSIS — M059 Rheumatoid arthritis with rheumatoid factor, unspecified: Secondary | ICD-10-CM

## 2018-01-30 DIAGNOSIS — M05742 Rheumatoid arthritis with rheumatoid factor of left hand without organ or systems involvement: Secondary | ICD-10-CM

## 2018-01-30 DIAGNOSIS — D709 Neutropenia, unspecified: Principal | ICD-10-CM

## 2018-01-30 DIAGNOSIS — I509 Heart failure, unspecified: Secondary | ICD-10-CM | POA: Diagnosis not present

## 2018-01-30 DIAGNOSIS — I110 Hypertensive heart disease with heart failure: Secondary | ICD-10-CM | POA: Diagnosis not present

## 2018-01-30 DIAGNOSIS — D696 Thrombocytopenia, unspecified: Secondary | ICD-10-CM | POA: Diagnosis not present

## 2018-01-30 DIAGNOSIS — I2510 Atherosclerotic heart disease of native coronary artery without angina pectoris: Secondary | ICD-10-CM | POA: Diagnosis not present

## 2018-01-30 DIAGNOSIS — E785 Hyperlipidemia, unspecified: Secondary | ICD-10-CM | POA: Diagnosis not present

## 2018-01-30 DIAGNOSIS — J439 Emphysema, unspecified: Secondary | ICD-10-CM | POA: Diagnosis not present

## 2018-01-30 DIAGNOSIS — I252 Old myocardial infarction: Secondary | ICD-10-CM | POA: Diagnosis not present

## 2018-01-30 DIAGNOSIS — E1142 Type 2 diabetes mellitus with diabetic polyneuropathy: Secondary | ICD-10-CM | POA: Diagnosis not present

## 2018-01-30 DIAGNOSIS — Z6827 Body mass index (BMI) 27.0-27.9, adult: Secondary | ICD-10-CM | POA: Diagnosis not present

## 2018-01-30 MED ORDER — METHOTREXATE SODIUM 25 MG/ML INJECTION SOLUTION
SUBCUTANEOUS | 6 refills | 0 days | Status: CP
Start: 2018-01-30 — End: 2018-02-05

## 2018-01-30 MED ORDER — PREDNISONE 5 MG TABLET
ORAL_TABLET | 6 refills | 0 days | Status: CP
Start: 2018-01-30 — End: 2018-03-20

## 2018-01-30 MED ORDER — PREDNISONE 10 MG TABLET
ORAL_TABLET | 5 refills | 0 days | Status: CP
Start: 2018-01-30 — End: 2018-03-20

## 2018-01-30 MED ORDER — SYRINGE WITH NEEDLE 1 ML 27 X 1/2"
INJECTION | 6 refills | 0 days | Status: CP
Start: 2018-01-30 — End: 2018-03-20

## 2018-02-04 ENCOUNTER — Ambulatory Visit: Payer: Medicare HMO | Admitting: Cardiology

## 2018-02-05 DIAGNOSIS — R531 Weakness: Secondary | ICD-10-CM | POA: Diagnosis not present

## 2018-02-05 DIAGNOSIS — Z8619 Personal history of other infectious and parasitic diseases: Secondary | ICD-10-CM | POA: Diagnosis not present

## 2018-02-05 DIAGNOSIS — Z09 Encounter for follow-up examination after completed treatment for conditions other than malignant neoplasm: Secondary | ICD-10-CM | POA: Diagnosis not present

## 2018-02-05 DIAGNOSIS — I480 Paroxysmal atrial fibrillation: Secondary | ICD-10-CM | POA: Diagnosis not present

## 2018-02-05 DIAGNOSIS — M0510 Rheumatoid lung disease with rheumatoid arthritis of unspecified site: Secondary | ICD-10-CM | POA: Diagnosis not present

## 2018-02-05 DIAGNOSIS — Z8701 Personal history of pneumonia (recurrent): Secondary | ICD-10-CM | POA: Diagnosis not present

## 2018-02-05 DIAGNOSIS — D703 Neutropenia due to infection: Secondary | ICD-10-CM | POA: Diagnosis not present

## 2018-02-05 DIAGNOSIS — R0902 Hypoxemia: Secondary | ICD-10-CM | POA: Diagnosis not present

## 2018-02-05 DIAGNOSIS — Z7901 Long term (current) use of anticoagulants: Secondary | ICD-10-CM | POA: Diagnosis not present

## 2018-02-05 DIAGNOSIS — M059 Rheumatoid arthritis with rheumatoid factor, unspecified: Secondary | ICD-10-CM | POA: Diagnosis not present

## 2018-02-05 MED ORDER — METHOTREXATE SODIUM 25 MG/ML INJECTION SOLUTION
SUBCUTANEOUS | 6 refills | 0 days | Status: CP
Start: 2018-02-05 — End: 2018-06-16

## 2018-02-06 DIAGNOSIS — M368 Systemic disorders of connective tissue in other diseases classified elsewhere: Secondary | ICD-10-CM | POA: Diagnosis not present

## 2018-02-06 DIAGNOSIS — M059 Rheumatoid arthritis with rheumatoid factor, unspecified: Secondary | ICD-10-CM | POA: Diagnosis not present

## 2018-02-09 DIAGNOSIS — E785 Hyperlipidemia, unspecified: Secondary | ICD-10-CM | POA: Diagnosis not present

## 2018-02-09 DIAGNOSIS — J9611 Chronic respiratory failure with hypoxia: Secondary | ICD-10-CM | POA: Diagnosis not present

## 2018-02-09 DIAGNOSIS — J9621 Acute and chronic respiratory failure with hypoxia: Secondary | ICD-10-CM | POA: Diagnosis not present

## 2018-02-09 DIAGNOSIS — I10 Essential (primary) hypertension: Secondary | ICD-10-CM | POA: Diagnosis not present

## 2018-02-09 DIAGNOSIS — J438 Other emphysema: Secondary | ICD-10-CM | POA: Diagnosis not present

## 2018-02-09 DIAGNOSIS — L03119 Cellulitis of unspecified part of limb: Secondary | ICD-10-CM | POA: Diagnosis not present

## 2018-02-09 DIAGNOSIS — J479 Bronchiectasis, uncomplicated: Secondary | ICD-10-CM | POA: Diagnosis not present

## 2018-02-09 DIAGNOSIS — J471 Bronchiectasis with (acute) exacerbation: Secondary | ICD-10-CM | POA: Diagnosis not present

## 2018-02-09 DIAGNOSIS — I5032 Chronic diastolic (congestive) heart failure: Secondary | ICD-10-CM | POA: Diagnosis not present

## 2018-02-09 DIAGNOSIS — J449 Chronic obstructive pulmonary disease, unspecified: Secondary | ICD-10-CM | POA: Diagnosis not present

## 2018-02-10 DIAGNOSIS — M059 Rheumatoid arthritis with rheumatoid factor, unspecified: Secondary | ICD-10-CM | POA: Diagnosis not present

## 2018-02-12 DIAGNOSIS — M368 Systemic disorders of connective tissue in other diseases classified elsewhere: Secondary | ICD-10-CM | POA: Diagnosis not present

## 2018-02-12 DIAGNOSIS — M059 Rheumatoid arthritis with rheumatoid factor, unspecified: Secondary | ICD-10-CM | POA: Diagnosis not present

## 2018-02-13 LAB — ACID FAST CULTURE WITH REFLEXED SENSITIVITIES (MYCOBACTERIA): Acid Fast Culture: NEGATIVE

## 2018-02-16 DIAGNOSIS — D703 Neutropenia due to infection: Secondary | ICD-10-CM | POA: Diagnosis not present

## 2018-02-16 DIAGNOSIS — Z8619 Personal history of other infectious and parasitic diseases: Secondary | ICD-10-CM | POA: Diagnosis not present

## 2018-02-16 DIAGNOSIS — I480 Paroxysmal atrial fibrillation: Secondary | ICD-10-CM | POA: Diagnosis not present

## 2018-02-16 DIAGNOSIS — Z8701 Personal history of pneumonia (recurrent): Secondary | ICD-10-CM | POA: Diagnosis not present

## 2018-02-16 DIAGNOSIS — M0510 Rheumatoid lung disease with rheumatoid arthritis of unspecified site: Secondary | ICD-10-CM | POA: Diagnosis not present

## 2018-02-16 DIAGNOSIS — Z7951 Long term (current) use of inhaled steroids: Secondary | ICD-10-CM | POA: Diagnosis not present

## 2018-02-16 DIAGNOSIS — B9689 Other specified bacterial agents as the cause of diseases classified elsewhere: Secondary | ICD-10-CM | POA: Diagnosis not present

## 2018-02-16 DIAGNOSIS — Z7982 Long term (current) use of aspirin: Secondary | ICD-10-CM | POA: Diagnosis not present

## 2018-02-16 DIAGNOSIS — Z9981 Dependence on supplemental oxygen: Secondary | ICD-10-CM | POA: Diagnosis not present

## 2018-02-16 DIAGNOSIS — Z7952 Long term (current) use of systemic steroids: Secondary | ICD-10-CM | POA: Diagnosis not present

## 2018-02-17 DIAGNOSIS — Z7951 Long term (current) use of inhaled steroids: Secondary | ICD-10-CM | POA: Diagnosis not present

## 2018-02-17 DIAGNOSIS — I480 Paroxysmal atrial fibrillation: Secondary | ICD-10-CM | POA: Diagnosis not present

## 2018-02-17 DIAGNOSIS — Z9981 Dependence on supplemental oxygen: Secondary | ICD-10-CM | POA: Diagnosis not present

## 2018-02-17 DIAGNOSIS — Z8701 Personal history of pneumonia (recurrent): Secondary | ICD-10-CM | POA: Diagnosis not present

## 2018-02-17 DIAGNOSIS — D703 Neutropenia due to infection: Secondary | ICD-10-CM | POA: Diagnosis not present

## 2018-02-17 DIAGNOSIS — M0510 Rheumatoid lung disease with rheumatoid arthritis of unspecified site: Secondary | ICD-10-CM | POA: Diagnosis not present

## 2018-02-17 DIAGNOSIS — Z7952 Long term (current) use of systemic steroids: Secondary | ICD-10-CM | POA: Diagnosis not present

## 2018-02-17 DIAGNOSIS — Z8619 Personal history of other infectious and parasitic diseases: Secondary | ICD-10-CM | POA: Diagnosis not present

## 2018-02-17 DIAGNOSIS — B9689 Other specified bacterial agents as the cause of diseases classified elsewhere: Secondary | ICD-10-CM | POA: Diagnosis not present

## 2018-02-17 DIAGNOSIS — Z7982 Long term (current) use of aspirin: Secondary | ICD-10-CM | POA: Diagnosis not present

## 2018-02-18 DIAGNOSIS — D72819 Decreased white blood cell count, unspecified: Secondary | ICD-10-CM | POA: Diagnosis not present

## 2018-02-18 DIAGNOSIS — M059 Rheumatoid arthritis with rheumatoid factor, unspecified: Secondary | ICD-10-CM | POA: Diagnosis not present

## 2018-02-18 DIAGNOSIS — J8489 Other specified interstitial pulmonary diseases: Secondary | ICD-10-CM | POA: Diagnosis not present

## 2018-02-19 DIAGNOSIS — B9689 Other specified bacterial agents as the cause of diseases classified elsewhere: Secondary | ICD-10-CM | POA: Diagnosis not present

## 2018-02-19 DIAGNOSIS — D703 Neutropenia due to infection: Secondary | ICD-10-CM | POA: Diagnosis not present

## 2018-02-19 DIAGNOSIS — I480 Paroxysmal atrial fibrillation: Secondary | ICD-10-CM | POA: Diagnosis not present

## 2018-02-19 DIAGNOSIS — Z8701 Personal history of pneumonia (recurrent): Secondary | ICD-10-CM | POA: Diagnosis not present

## 2018-02-19 DIAGNOSIS — Z8619 Personal history of other infectious and parasitic diseases: Secondary | ICD-10-CM | POA: Diagnosis not present

## 2018-02-19 DIAGNOSIS — Z9981 Dependence on supplemental oxygen: Secondary | ICD-10-CM | POA: Diagnosis not present

## 2018-02-19 DIAGNOSIS — Z7952 Long term (current) use of systemic steroids: Secondary | ICD-10-CM | POA: Diagnosis not present

## 2018-02-19 DIAGNOSIS — M0510 Rheumatoid lung disease with rheumatoid arthritis of unspecified site: Secondary | ICD-10-CM | POA: Diagnosis not present

## 2018-02-19 DIAGNOSIS — Z7982 Long term (current) use of aspirin: Secondary | ICD-10-CM | POA: Diagnosis not present

## 2018-02-19 DIAGNOSIS — Z7951 Long term (current) use of inhaled steroids: Secondary | ICD-10-CM | POA: Diagnosis not present

## 2018-02-20 DIAGNOSIS — Z8619 Personal history of other infectious and parasitic diseases: Secondary | ICD-10-CM | POA: Diagnosis not present

## 2018-02-20 DIAGNOSIS — Z7952 Long term (current) use of systemic steroids: Secondary | ICD-10-CM | POA: Diagnosis not present

## 2018-02-20 DIAGNOSIS — Z7951 Long term (current) use of inhaled steroids: Secondary | ICD-10-CM | POA: Diagnosis not present

## 2018-02-20 DIAGNOSIS — D703 Neutropenia due to infection: Secondary | ICD-10-CM | POA: Diagnosis not present

## 2018-02-20 DIAGNOSIS — Z7982 Long term (current) use of aspirin: Secondary | ICD-10-CM | POA: Diagnosis not present

## 2018-02-20 DIAGNOSIS — B9689 Other specified bacterial agents as the cause of diseases classified elsewhere: Secondary | ICD-10-CM | POA: Diagnosis not present

## 2018-02-20 DIAGNOSIS — Z9981 Dependence on supplemental oxygen: Secondary | ICD-10-CM | POA: Diagnosis not present

## 2018-02-20 DIAGNOSIS — M0510 Rheumatoid lung disease with rheumatoid arthritis of unspecified site: Secondary | ICD-10-CM | POA: Diagnosis not present

## 2018-02-20 DIAGNOSIS — Z8701 Personal history of pneumonia (recurrent): Secondary | ICD-10-CM | POA: Diagnosis not present

## 2018-02-20 DIAGNOSIS — I480 Paroxysmal atrial fibrillation: Secondary | ICD-10-CM | POA: Diagnosis not present

## 2018-02-23 DIAGNOSIS — Z9981 Dependence on supplemental oxygen: Secondary | ICD-10-CM | POA: Diagnosis not present

## 2018-02-23 DIAGNOSIS — Z7951 Long term (current) use of inhaled steroids: Secondary | ICD-10-CM | POA: Diagnosis not present

## 2018-02-23 DIAGNOSIS — D703 Neutropenia due to infection: Secondary | ICD-10-CM | POA: Diagnosis not present

## 2018-02-23 DIAGNOSIS — Z8619 Personal history of other infectious and parasitic diseases: Secondary | ICD-10-CM | POA: Diagnosis not present

## 2018-02-23 DIAGNOSIS — I480 Paroxysmal atrial fibrillation: Secondary | ICD-10-CM | POA: Diagnosis not present

## 2018-02-23 DIAGNOSIS — B9689 Other specified bacterial agents as the cause of diseases classified elsewhere: Secondary | ICD-10-CM | POA: Diagnosis not present

## 2018-02-23 DIAGNOSIS — Z8701 Personal history of pneumonia (recurrent): Secondary | ICD-10-CM | POA: Diagnosis not present

## 2018-02-23 DIAGNOSIS — Z7982 Long term (current) use of aspirin: Secondary | ICD-10-CM | POA: Diagnosis not present

## 2018-02-23 DIAGNOSIS — M0510 Rheumatoid lung disease with rheumatoid arthritis of unspecified site: Secondary | ICD-10-CM | POA: Diagnosis not present

## 2018-02-23 DIAGNOSIS — Z7952 Long term (current) use of systemic steroids: Secondary | ICD-10-CM | POA: Diagnosis not present

## 2018-02-24 DIAGNOSIS — J849 Interstitial pulmonary disease, unspecified: Secondary | ICD-10-CM | POA: Diagnosis not present

## 2018-02-24 DIAGNOSIS — I4891 Unspecified atrial fibrillation: Secondary | ICD-10-CM | POA: Diagnosis not present

## 2018-02-24 DIAGNOSIS — R0902 Hypoxemia: Secondary | ICD-10-CM | POA: Diagnosis not present

## 2018-02-24 DIAGNOSIS — L409 Psoriasis, unspecified: Secondary | ICD-10-CM | POA: Diagnosis not present

## 2018-02-24 DIAGNOSIS — J449 Chronic obstructive pulmonary disease, unspecified: Secondary | ICD-10-CM | POA: Diagnosis not present

## 2018-02-24 DIAGNOSIS — M059 Rheumatoid arthritis with rheumatoid factor, unspecified: Secondary | ICD-10-CM | POA: Diagnosis not present

## 2018-02-24 DIAGNOSIS — Z951 Presence of aortocoronary bypass graft: Secondary | ICD-10-CM | POA: Diagnosis not present

## 2018-02-25 DIAGNOSIS — Z8701 Personal history of pneumonia (recurrent): Secondary | ICD-10-CM | POA: Diagnosis not present

## 2018-02-25 DIAGNOSIS — Z9981 Dependence on supplemental oxygen: Secondary | ICD-10-CM | POA: Diagnosis not present

## 2018-02-25 DIAGNOSIS — B9689 Other specified bacterial agents as the cause of diseases classified elsewhere: Secondary | ICD-10-CM | POA: Diagnosis not present

## 2018-02-25 DIAGNOSIS — Z8619 Personal history of other infectious and parasitic diseases: Secondary | ICD-10-CM | POA: Diagnosis not present

## 2018-02-25 DIAGNOSIS — I480 Paroxysmal atrial fibrillation: Secondary | ICD-10-CM | POA: Diagnosis not present

## 2018-02-25 DIAGNOSIS — Z7951 Long term (current) use of inhaled steroids: Secondary | ICD-10-CM | POA: Diagnosis not present

## 2018-02-25 DIAGNOSIS — Z7982 Long term (current) use of aspirin: Secondary | ICD-10-CM | POA: Diagnosis not present

## 2018-02-25 DIAGNOSIS — Z7952 Long term (current) use of systemic steroids: Secondary | ICD-10-CM | POA: Diagnosis not present

## 2018-02-25 DIAGNOSIS — M0510 Rheumatoid lung disease with rheumatoid arthritis of unspecified site: Secondary | ICD-10-CM | POA: Diagnosis not present

## 2018-02-25 DIAGNOSIS — D703 Neutropenia due to infection: Secondary | ICD-10-CM | POA: Diagnosis not present

## 2018-02-25 DIAGNOSIS — D72819 Decreased white blood cell count, unspecified: Secondary | ICD-10-CM | POA: Diagnosis not present

## 2018-02-26 DIAGNOSIS — Z7982 Long term (current) use of aspirin: Secondary | ICD-10-CM | POA: Diagnosis not present

## 2018-02-26 DIAGNOSIS — Z9981 Dependence on supplemental oxygen: Secondary | ICD-10-CM | POA: Diagnosis not present

## 2018-02-26 DIAGNOSIS — B9689 Other specified bacterial agents as the cause of diseases classified elsewhere: Secondary | ICD-10-CM | POA: Diagnosis not present

## 2018-02-26 DIAGNOSIS — Z8701 Personal history of pneumonia (recurrent): Secondary | ICD-10-CM | POA: Diagnosis not present

## 2018-02-26 DIAGNOSIS — Z8619 Personal history of other infectious and parasitic diseases: Secondary | ICD-10-CM | POA: Diagnosis not present

## 2018-02-26 DIAGNOSIS — I480 Paroxysmal atrial fibrillation: Secondary | ICD-10-CM | POA: Diagnosis not present

## 2018-02-26 DIAGNOSIS — Z7952 Long term (current) use of systemic steroids: Secondary | ICD-10-CM | POA: Diagnosis not present

## 2018-02-26 DIAGNOSIS — M0510 Rheumatoid lung disease with rheumatoid arthritis of unspecified site: Secondary | ICD-10-CM | POA: Diagnosis not present

## 2018-02-26 DIAGNOSIS — D703 Neutropenia due to infection: Secondary | ICD-10-CM | POA: Diagnosis not present

## 2018-02-26 DIAGNOSIS — Z7951 Long term (current) use of inhaled steroids: Secondary | ICD-10-CM | POA: Diagnosis not present

## 2018-02-27 DIAGNOSIS — Z9981 Dependence on supplemental oxygen: Secondary | ICD-10-CM | POA: Diagnosis not present

## 2018-02-27 DIAGNOSIS — Z7951 Long term (current) use of inhaled steroids: Secondary | ICD-10-CM | POA: Diagnosis not present

## 2018-02-27 DIAGNOSIS — I480 Paroxysmal atrial fibrillation: Secondary | ICD-10-CM | POA: Diagnosis not present

## 2018-02-27 DIAGNOSIS — D703 Neutropenia due to infection: Secondary | ICD-10-CM | POA: Diagnosis not present

## 2018-02-27 DIAGNOSIS — Z8619 Personal history of other infectious and parasitic diseases: Secondary | ICD-10-CM | POA: Diagnosis not present

## 2018-02-27 DIAGNOSIS — Z7982 Long term (current) use of aspirin: Secondary | ICD-10-CM | POA: Diagnosis not present

## 2018-02-27 DIAGNOSIS — Z7952 Long term (current) use of systemic steroids: Secondary | ICD-10-CM | POA: Diagnosis not present

## 2018-02-27 DIAGNOSIS — M0510 Rheumatoid lung disease with rheumatoid arthritis of unspecified site: Secondary | ICD-10-CM | POA: Diagnosis not present

## 2018-02-27 DIAGNOSIS — B9689 Other specified bacterial agents as the cause of diseases classified elsewhere: Secondary | ICD-10-CM | POA: Diagnosis not present

## 2018-02-27 DIAGNOSIS — Z8701 Personal history of pneumonia (recurrent): Secondary | ICD-10-CM | POA: Diagnosis not present

## 2018-03-03 DIAGNOSIS — Z9981 Dependence on supplemental oxygen: Secondary | ICD-10-CM | POA: Diagnosis not present

## 2018-03-03 DIAGNOSIS — Z7952 Long term (current) use of systemic steroids: Secondary | ICD-10-CM | POA: Diagnosis not present

## 2018-03-03 DIAGNOSIS — I480 Paroxysmal atrial fibrillation: Secondary | ICD-10-CM | POA: Diagnosis not present

## 2018-03-03 DIAGNOSIS — M0510 Rheumatoid lung disease with rheumatoid arthritis of unspecified site: Secondary | ICD-10-CM | POA: Diagnosis not present

## 2018-03-03 DIAGNOSIS — D703 Neutropenia due to infection: Secondary | ICD-10-CM | POA: Diagnosis not present

## 2018-03-03 DIAGNOSIS — Z7951 Long term (current) use of inhaled steroids: Secondary | ICD-10-CM | POA: Diagnosis not present

## 2018-03-03 DIAGNOSIS — B9689 Other specified bacterial agents as the cause of diseases classified elsewhere: Secondary | ICD-10-CM | POA: Diagnosis not present

## 2018-03-03 DIAGNOSIS — Z7982 Long term (current) use of aspirin: Secondary | ICD-10-CM | POA: Diagnosis not present

## 2018-03-03 DIAGNOSIS — Z8619 Personal history of other infectious and parasitic diseases: Secondary | ICD-10-CM | POA: Diagnosis not present

## 2018-03-03 DIAGNOSIS — Z8701 Personal history of pneumonia (recurrent): Secondary | ICD-10-CM | POA: Diagnosis not present

## 2018-03-04 DIAGNOSIS — D708 Other neutropenia: Secondary | ICD-10-CM | POA: Diagnosis not present

## 2018-03-06 DIAGNOSIS — J439 Emphysema, unspecified: Secondary | ICD-10-CM | POA: Diagnosis not present

## 2018-03-06 DIAGNOSIS — J849 Interstitial pulmonary disease, unspecified: Secondary | ICD-10-CM | POA: Diagnosis not present

## 2018-03-09 DIAGNOSIS — D708 Other neutropenia: Secondary | ICD-10-CM | POA: Diagnosis not present

## 2018-03-09 DIAGNOSIS — Z8619 Personal history of other infectious and parasitic diseases: Secondary | ICD-10-CM | POA: Diagnosis not present

## 2018-03-09 DIAGNOSIS — J189 Pneumonia, unspecified organism: Secondary | ICD-10-CM | POA: Diagnosis not present

## 2018-03-09 DIAGNOSIS — Z9981 Dependence on supplemental oxygen: Secondary | ICD-10-CM | POA: Diagnosis not present

## 2018-03-09 DIAGNOSIS — J9611 Chronic respiratory failure with hypoxia: Secondary | ICD-10-CM | POA: Diagnosis not present

## 2018-03-09 DIAGNOSIS — M059 Rheumatoid arthritis with rheumatoid factor, unspecified: Secondary | ICD-10-CM | POA: Diagnosis not present

## 2018-03-09 DIAGNOSIS — J849 Interstitial pulmonary disease, unspecified: Secondary | ICD-10-CM | POA: Diagnosis not present

## 2018-03-09 DIAGNOSIS — J984 Other disorders of lung: Secondary | ICD-10-CM | POA: Diagnosis not present

## 2018-03-09 DIAGNOSIS — J431 Panlobular emphysema: Secondary | ICD-10-CM | POA: Diagnosis not present

## 2018-03-10 DIAGNOSIS — Z8619 Personal history of other infectious and parasitic diseases: Secondary | ICD-10-CM | POA: Diagnosis not present

## 2018-03-10 DIAGNOSIS — Z8701 Personal history of pneumonia (recurrent): Secondary | ICD-10-CM | POA: Diagnosis not present

## 2018-03-10 DIAGNOSIS — I480 Paroxysmal atrial fibrillation: Secondary | ICD-10-CM | POA: Diagnosis not present

## 2018-03-10 DIAGNOSIS — B9689 Other specified bacterial agents as the cause of diseases classified elsewhere: Secondary | ICD-10-CM | POA: Diagnosis not present

## 2018-03-10 DIAGNOSIS — Z9981 Dependence on supplemental oxygen: Secondary | ICD-10-CM | POA: Diagnosis not present

## 2018-03-10 DIAGNOSIS — Z7982 Long term (current) use of aspirin: Secondary | ICD-10-CM | POA: Diagnosis not present

## 2018-03-10 DIAGNOSIS — Z7951 Long term (current) use of inhaled steroids: Secondary | ICD-10-CM | POA: Diagnosis not present

## 2018-03-10 DIAGNOSIS — D703 Neutropenia due to infection: Secondary | ICD-10-CM | POA: Diagnosis not present

## 2018-03-10 DIAGNOSIS — Z7952 Long term (current) use of systemic steroids: Secondary | ICD-10-CM | POA: Diagnosis not present

## 2018-03-10 DIAGNOSIS — M0510 Rheumatoid lung disease with rheumatoid arthritis of unspecified site: Secondary | ICD-10-CM | POA: Diagnosis not present

## 2018-03-11 DIAGNOSIS — Z7982 Long term (current) use of aspirin: Secondary | ICD-10-CM | POA: Diagnosis not present

## 2018-03-11 DIAGNOSIS — J471 Bronchiectasis with (acute) exacerbation: Secondary | ICD-10-CM | POA: Diagnosis not present

## 2018-03-11 DIAGNOSIS — I480 Paroxysmal atrial fibrillation: Secondary | ICD-10-CM | POA: Diagnosis not present

## 2018-03-11 DIAGNOSIS — M0510 Rheumatoid lung disease with rheumatoid arthritis of unspecified site: Secondary | ICD-10-CM | POA: Diagnosis not present

## 2018-03-11 DIAGNOSIS — B9689 Other specified bacterial agents as the cause of diseases classified elsewhere: Secondary | ICD-10-CM | POA: Diagnosis not present

## 2018-03-11 DIAGNOSIS — J438 Other emphysema: Secondary | ICD-10-CM | POA: Diagnosis not present

## 2018-03-11 DIAGNOSIS — D703 Neutropenia due to infection: Secondary | ICD-10-CM | POA: Diagnosis not present

## 2018-03-11 DIAGNOSIS — J9621 Acute and chronic respiratory failure with hypoxia: Secondary | ICD-10-CM | POA: Diagnosis not present

## 2018-03-11 DIAGNOSIS — J479 Bronchiectasis, uncomplicated: Secondary | ICD-10-CM | POA: Diagnosis not present

## 2018-03-11 DIAGNOSIS — Z9981 Dependence on supplemental oxygen: Secondary | ICD-10-CM | POA: Diagnosis not present

## 2018-03-11 DIAGNOSIS — Z7952 Long term (current) use of systemic steroids: Secondary | ICD-10-CM | POA: Diagnosis not present

## 2018-03-11 DIAGNOSIS — E785 Hyperlipidemia, unspecified: Secondary | ICD-10-CM | POA: Diagnosis not present

## 2018-03-11 DIAGNOSIS — L03119 Cellulitis of unspecified part of limb: Secondary | ICD-10-CM | POA: Diagnosis not present

## 2018-03-11 DIAGNOSIS — Z7951 Long term (current) use of inhaled steroids: Secondary | ICD-10-CM | POA: Diagnosis not present

## 2018-03-11 DIAGNOSIS — I5032 Chronic diastolic (congestive) heart failure: Secondary | ICD-10-CM | POA: Diagnosis not present

## 2018-03-11 DIAGNOSIS — J449 Chronic obstructive pulmonary disease, unspecified: Secondary | ICD-10-CM | POA: Diagnosis not present

## 2018-03-11 DIAGNOSIS — Z8619 Personal history of other infectious and parasitic diseases: Secondary | ICD-10-CM | POA: Diagnosis not present

## 2018-03-11 DIAGNOSIS — I10 Essential (primary) hypertension: Secondary | ICD-10-CM | POA: Diagnosis not present

## 2018-03-11 DIAGNOSIS — J9611 Chronic respiratory failure with hypoxia: Secondary | ICD-10-CM | POA: Diagnosis not present

## 2018-03-11 DIAGNOSIS — Z8701 Personal history of pneumonia (recurrent): Secondary | ICD-10-CM | POA: Diagnosis not present

## 2018-03-13 DIAGNOSIS — Z7951 Long term (current) use of inhaled steroids: Secondary | ICD-10-CM | POA: Diagnosis not present

## 2018-03-13 DIAGNOSIS — Z9981 Dependence on supplemental oxygen: Secondary | ICD-10-CM | POA: Diagnosis not present

## 2018-03-13 DIAGNOSIS — B9689 Other specified bacterial agents as the cause of diseases classified elsewhere: Secondary | ICD-10-CM | POA: Diagnosis not present

## 2018-03-13 DIAGNOSIS — I480 Paroxysmal atrial fibrillation: Secondary | ICD-10-CM | POA: Diagnosis not present

## 2018-03-13 DIAGNOSIS — D703 Neutropenia due to infection: Secondary | ICD-10-CM | POA: Diagnosis not present

## 2018-03-13 DIAGNOSIS — Z8701 Personal history of pneumonia (recurrent): Secondary | ICD-10-CM | POA: Diagnosis not present

## 2018-03-13 DIAGNOSIS — Z7952 Long term (current) use of systemic steroids: Secondary | ICD-10-CM | POA: Diagnosis not present

## 2018-03-13 DIAGNOSIS — Z8619 Personal history of other infectious and parasitic diseases: Secondary | ICD-10-CM | POA: Diagnosis not present

## 2018-03-13 DIAGNOSIS — Z7982 Long term (current) use of aspirin: Secondary | ICD-10-CM | POA: Diagnosis not present

## 2018-03-13 DIAGNOSIS — M0510 Rheumatoid lung disease with rheumatoid arthritis of unspecified site: Secondary | ICD-10-CM | POA: Diagnosis not present

## 2018-03-16 DIAGNOSIS — Z8619 Personal history of other infectious and parasitic diseases: Secondary | ICD-10-CM | POA: Diagnosis not present

## 2018-03-16 DIAGNOSIS — D708 Other neutropenia: Secondary | ICD-10-CM | POA: Diagnosis not present

## 2018-03-16 DIAGNOSIS — M059 Rheumatoid arthritis with rheumatoid factor, unspecified: Secondary | ICD-10-CM | POA: Diagnosis not present

## 2018-03-17 DIAGNOSIS — D703 Neutropenia due to infection: Secondary | ICD-10-CM | POA: Diagnosis not present

## 2018-03-17 DIAGNOSIS — B9689 Other specified bacterial agents as the cause of diseases classified elsewhere: Secondary | ICD-10-CM | POA: Diagnosis not present

## 2018-03-17 DIAGNOSIS — M0510 Rheumatoid lung disease with rheumatoid arthritis of unspecified site: Secondary | ICD-10-CM | POA: Diagnosis not present

## 2018-03-17 DIAGNOSIS — Z8701 Personal history of pneumonia (recurrent): Secondary | ICD-10-CM | POA: Diagnosis not present

## 2018-03-17 DIAGNOSIS — Z7982 Long term (current) use of aspirin: Secondary | ICD-10-CM | POA: Diagnosis not present

## 2018-03-17 DIAGNOSIS — Z7952 Long term (current) use of systemic steroids: Secondary | ICD-10-CM | POA: Diagnosis not present

## 2018-03-17 DIAGNOSIS — Z9981 Dependence on supplemental oxygen: Secondary | ICD-10-CM | POA: Diagnosis not present

## 2018-03-17 DIAGNOSIS — Z7951 Long term (current) use of inhaled steroids: Secondary | ICD-10-CM | POA: Diagnosis not present

## 2018-03-17 DIAGNOSIS — I480 Paroxysmal atrial fibrillation: Secondary | ICD-10-CM | POA: Diagnosis not present

## 2018-03-17 DIAGNOSIS — Z8619 Personal history of other infectious and parasitic diseases: Secondary | ICD-10-CM | POA: Diagnosis not present

## 2018-03-18 DIAGNOSIS — Z7952 Long term (current) use of systemic steroids: Secondary | ICD-10-CM | POA: Diagnosis not present

## 2018-03-18 DIAGNOSIS — M0510 Rheumatoid lung disease with rheumatoid arthritis of unspecified site: Secondary | ICD-10-CM | POA: Diagnosis not present

## 2018-03-18 DIAGNOSIS — Z7982 Long term (current) use of aspirin: Secondary | ICD-10-CM | POA: Diagnosis not present

## 2018-03-18 DIAGNOSIS — Z8701 Personal history of pneumonia (recurrent): Secondary | ICD-10-CM | POA: Diagnosis not present

## 2018-03-18 DIAGNOSIS — B9689 Other specified bacterial agents as the cause of diseases classified elsewhere: Secondary | ICD-10-CM | POA: Diagnosis not present

## 2018-03-18 DIAGNOSIS — D703 Neutropenia due to infection: Secondary | ICD-10-CM | POA: Diagnosis not present

## 2018-03-18 DIAGNOSIS — Z8619 Personal history of other infectious and parasitic diseases: Secondary | ICD-10-CM | POA: Diagnosis not present

## 2018-03-18 DIAGNOSIS — Z7951 Long term (current) use of inhaled steroids: Secondary | ICD-10-CM | POA: Diagnosis not present

## 2018-03-18 DIAGNOSIS — I480 Paroxysmal atrial fibrillation: Secondary | ICD-10-CM | POA: Diagnosis not present

## 2018-03-18 DIAGNOSIS — Z9981 Dependence on supplemental oxygen: Secondary | ICD-10-CM | POA: Diagnosis not present

## 2018-03-19 DIAGNOSIS — M059 Rheumatoid arthritis with rheumatoid factor, unspecified: Secondary | ICD-10-CM | POA: Diagnosis not present

## 2018-03-19 DIAGNOSIS — D708 Other neutropenia: Secondary | ICD-10-CM | POA: Diagnosis not present

## 2018-03-20 ENCOUNTER — Ambulatory Visit: Admit: 2018-03-20 | Discharge: 2018-03-21 | Payer: MEDICARE | Attending: Rheumatology | Primary: Rheumatology

## 2018-03-20 DIAGNOSIS — M05742 Rheumatoid arthritis with rheumatoid factor of left hand without organ or systems involvement: Secondary | ICD-10-CM

## 2018-03-20 DIAGNOSIS — M069 Rheumatoid arthritis, unspecified: Secondary | ICD-10-CM

## 2018-03-20 DIAGNOSIS — M05741 Rheumatoid arthritis with rheumatoid factor of right hand without organ or systems involvement: Secondary | ICD-10-CM

## 2018-03-20 DIAGNOSIS — M059 Rheumatoid arthritis with rheumatoid factor, unspecified: Principal | ICD-10-CM

## 2018-03-20 DIAGNOSIS — R69 Illness, unspecified: Secondary | ICD-10-CM | POA: Diagnosis not present

## 2018-03-20 MED ORDER — SYRINGE WITH NEEDLE 1 ML 27 X 1/2"
INJECTION | 6 refills | 0.00000 days | Status: CP
Start: 2018-03-20 — End: 2018-04-24

## 2018-03-20 MED ORDER — PREDNISONE 5 MG TABLET
ORAL_TABLET | 6 refills | 0 days | Status: CP
Start: 2018-03-20 — End: 2019-03-05

## 2018-03-20 MED ORDER — PREDNISONE 10 MG TABLET
ORAL_TABLET | Freq: Every day | ORAL | 5 refills | 0 days | Status: CP
Start: 2018-03-20 — End: 2018-06-16

## 2018-03-23 DIAGNOSIS — Z8619 Personal history of other infectious and parasitic diseases: Secondary | ICD-10-CM | POA: Diagnosis not present

## 2018-03-23 DIAGNOSIS — M059 Rheumatoid arthritis with rheumatoid factor, unspecified: Secondary | ICD-10-CM | POA: Diagnosis not present

## 2018-03-23 DIAGNOSIS — M549 Dorsalgia, unspecified: Secondary | ICD-10-CM | POA: Diagnosis not present

## 2018-03-23 DIAGNOSIS — D708 Other neutropenia: Secondary | ICD-10-CM | POA: Diagnosis not present

## 2018-03-25 DIAGNOSIS — Z9981 Dependence on supplemental oxygen: Secondary | ICD-10-CM | POA: Diagnosis not present

## 2018-03-25 DIAGNOSIS — Z7951 Long term (current) use of inhaled steroids: Secondary | ICD-10-CM | POA: Diagnosis not present

## 2018-03-25 DIAGNOSIS — B9689 Other specified bacterial agents as the cause of diseases classified elsewhere: Secondary | ICD-10-CM | POA: Diagnosis not present

## 2018-03-25 DIAGNOSIS — Z8701 Personal history of pneumonia (recurrent): Secondary | ICD-10-CM | POA: Diagnosis not present

## 2018-03-25 DIAGNOSIS — M0510 Rheumatoid lung disease with rheumatoid arthritis of unspecified site: Secondary | ICD-10-CM | POA: Diagnosis not present

## 2018-03-25 DIAGNOSIS — Z7982 Long term (current) use of aspirin: Secondary | ICD-10-CM | POA: Diagnosis not present

## 2018-03-25 DIAGNOSIS — D703 Neutropenia due to infection: Secondary | ICD-10-CM | POA: Diagnosis not present

## 2018-03-25 DIAGNOSIS — Z8619 Personal history of other infectious and parasitic diseases: Secondary | ICD-10-CM | POA: Diagnosis not present

## 2018-03-25 DIAGNOSIS — Z7952 Long term (current) use of systemic steroids: Secondary | ICD-10-CM | POA: Diagnosis not present

## 2018-03-25 DIAGNOSIS — I480 Paroxysmal atrial fibrillation: Secondary | ICD-10-CM | POA: Diagnosis not present

## 2018-03-26 DIAGNOSIS — D708 Other neutropenia: Secondary | ICD-10-CM | POA: Diagnosis not present

## 2018-03-27 DIAGNOSIS — M0510 Rheumatoid lung disease with rheumatoid arthritis of unspecified site: Secondary | ICD-10-CM | POA: Diagnosis not present

## 2018-03-27 DIAGNOSIS — Z7952 Long term (current) use of systemic steroids: Secondary | ICD-10-CM | POA: Diagnosis not present

## 2018-03-27 DIAGNOSIS — D703 Neutropenia due to infection: Secondary | ICD-10-CM | POA: Diagnosis not present

## 2018-03-27 DIAGNOSIS — I480 Paroxysmal atrial fibrillation: Secondary | ICD-10-CM | POA: Diagnosis not present

## 2018-03-27 DIAGNOSIS — Z8619 Personal history of other infectious and parasitic diseases: Secondary | ICD-10-CM | POA: Diagnosis not present

## 2018-03-27 DIAGNOSIS — Z7982 Long term (current) use of aspirin: Secondary | ICD-10-CM | POA: Diagnosis not present

## 2018-03-27 DIAGNOSIS — Z8701 Personal history of pneumonia (recurrent): Secondary | ICD-10-CM | POA: Diagnosis not present

## 2018-03-27 DIAGNOSIS — B9689 Other specified bacterial agents as the cause of diseases classified elsewhere: Secondary | ICD-10-CM | POA: Diagnosis not present

## 2018-03-27 DIAGNOSIS — Z7951 Long term (current) use of inhaled steroids: Secondary | ICD-10-CM | POA: Diagnosis not present

## 2018-03-27 DIAGNOSIS — Z9981 Dependence on supplemental oxygen: Secondary | ICD-10-CM | POA: Diagnosis not present

## 2018-03-30 DIAGNOSIS — M059 Rheumatoid arthritis with rheumatoid factor, unspecified: Secondary | ICD-10-CM | POA: Diagnosis not present

## 2018-03-30 DIAGNOSIS — Z8619 Personal history of other infectious and parasitic diseases: Secondary | ICD-10-CM | POA: Diagnosis not present

## 2018-03-30 DIAGNOSIS — D708 Other neutropenia: Secondary | ICD-10-CM | POA: Diagnosis not present

## 2018-03-31 DIAGNOSIS — Z7952 Long term (current) use of systemic steroids: Secondary | ICD-10-CM | POA: Diagnosis not present

## 2018-03-31 DIAGNOSIS — Z8701 Personal history of pneumonia (recurrent): Secondary | ICD-10-CM | POA: Diagnosis not present

## 2018-03-31 DIAGNOSIS — Z8619 Personal history of other infectious and parasitic diseases: Secondary | ICD-10-CM | POA: Diagnosis not present

## 2018-03-31 DIAGNOSIS — I480 Paroxysmal atrial fibrillation: Secondary | ICD-10-CM | POA: Diagnosis not present

## 2018-03-31 DIAGNOSIS — M0510 Rheumatoid lung disease with rheumatoid arthritis of unspecified site: Secondary | ICD-10-CM | POA: Diagnosis not present

## 2018-03-31 DIAGNOSIS — D703 Neutropenia due to infection: Secondary | ICD-10-CM | POA: Diagnosis not present

## 2018-03-31 DIAGNOSIS — B9689 Other specified bacterial agents as the cause of diseases classified elsewhere: Secondary | ICD-10-CM | POA: Diagnosis not present

## 2018-03-31 DIAGNOSIS — Z7951 Long term (current) use of inhaled steroids: Secondary | ICD-10-CM | POA: Diagnosis not present

## 2018-03-31 DIAGNOSIS — Z9981 Dependence on supplemental oxygen: Secondary | ICD-10-CM | POA: Diagnosis not present

## 2018-03-31 DIAGNOSIS — Z7982 Long term (current) use of aspirin: Secondary | ICD-10-CM | POA: Diagnosis not present

## 2018-04-01 DIAGNOSIS — Z7982 Long term (current) use of aspirin: Secondary | ICD-10-CM | POA: Diagnosis not present

## 2018-04-01 DIAGNOSIS — Z9981 Dependence on supplemental oxygen: Secondary | ICD-10-CM | POA: Diagnosis not present

## 2018-04-01 DIAGNOSIS — D703 Neutropenia due to infection: Secondary | ICD-10-CM | POA: Diagnosis not present

## 2018-04-01 DIAGNOSIS — Z7951 Long term (current) use of inhaled steroids: Secondary | ICD-10-CM | POA: Diagnosis not present

## 2018-04-01 DIAGNOSIS — Z8701 Personal history of pneumonia (recurrent): Secondary | ICD-10-CM | POA: Diagnosis not present

## 2018-04-01 DIAGNOSIS — Z7952 Long term (current) use of systemic steroids: Secondary | ICD-10-CM | POA: Diagnosis not present

## 2018-04-01 DIAGNOSIS — I480 Paroxysmal atrial fibrillation: Secondary | ICD-10-CM | POA: Diagnosis not present

## 2018-04-01 DIAGNOSIS — M0510 Rheumatoid lung disease with rheumatoid arthritis of unspecified site: Secondary | ICD-10-CM | POA: Diagnosis not present

## 2018-04-01 DIAGNOSIS — B9689 Other specified bacterial agents as the cause of diseases classified elsewhere: Secondary | ICD-10-CM | POA: Diagnosis not present

## 2018-04-01 DIAGNOSIS — Z8619 Personal history of other infectious and parasitic diseases: Secondary | ICD-10-CM | POA: Diagnosis not present

## 2018-04-02 DIAGNOSIS — Z8619 Personal history of other infectious and parasitic diseases: Secondary | ICD-10-CM | POA: Diagnosis not present

## 2018-04-02 DIAGNOSIS — D708 Other neutropenia: Secondary | ICD-10-CM | POA: Diagnosis not present

## 2018-04-02 DIAGNOSIS — M059 Rheumatoid arthritis with rheumatoid factor, unspecified: Secondary | ICD-10-CM | POA: Diagnosis not present

## 2018-04-03 MED ORDER — OXYCODONE-ACETAMINOPHEN 7.5 MG-325 MG TABLET
ORAL_TABLET | Freq: Four times a day (QID) | ORAL | 0 refills | 0 days | Status: CP | PRN
Start: 2018-04-03 — End: 2018-04-07

## 2018-04-06 DIAGNOSIS — Z7952 Long term (current) use of systemic steroids: Secondary | ICD-10-CM | POA: Diagnosis not present

## 2018-04-06 DIAGNOSIS — M059 Rheumatoid arthritis with rheumatoid factor, unspecified: Secondary | ICD-10-CM | POA: Diagnosis not present

## 2018-04-06 DIAGNOSIS — M0510 Rheumatoid lung disease with rheumatoid arthritis of unspecified site: Secondary | ICD-10-CM | POA: Diagnosis not present

## 2018-04-06 DIAGNOSIS — Z8619 Personal history of other infectious and parasitic diseases: Secondary | ICD-10-CM | POA: Diagnosis not present

## 2018-04-06 DIAGNOSIS — Z7951 Long term (current) use of inhaled steroids: Secondary | ICD-10-CM | POA: Diagnosis not present

## 2018-04-06 DIAGNOSIS — Z7982 Long term (current) use of aspirin: Secondary | ICD-10-CM | POA: Diagnosis not present

## 2018-04-06 DIAGNOSIS — I480 Paroxysmal atrial fibrillation: Secondary | ICD-10-CM | POA: Diagnosis not present

## 2018-04-06 DIAGNOSIS — D708 Other neutropenia: Secondary | ICD-10-CM | POA: Diagnosis not present

## 2018-04-06 DIAGNOSIS — D703 Neutropenia due to infection: Secondary | ICD-10-CM | POA: Diagnosis not present

## 2018-04-06 DIAGNOSIS — B9689 Other specified bacterial agents as the cause of diseases classified elsewhere: Secondary | ICD-10-CM | POA: Diagnosis not present

## 2018-04-06 DIAGNOSIS — Z8701 Personal history of pneumonia (recurrent): Secondary | ICD-10-CM | POA: Diagnosis not present

## 2018-04-06 DIAGNOSIS — Z9981 Dependence on supplemental oxygen: Secondary | ICD-10-CM | POA: Diagnosis not present

## 2018-04-07 MED ORDER — OXYCODONE-ACETAMINOPHEN 7.5 MG-325 MG TABLET
ORAL_TABLET | Freq: Four times a day (QID) | ORAL | 0 refills | 0.00000 days | Status: CP | PRN
Start: 2018-04-07 — End: 2018-04-23

## 2018-04-08 DIAGNOSIS — Z9981 Dependence on supplemental oxygen: Secondary | ICD-10-CM | POA: Diagnosis not present

## 2018-04-08 DIAGNOSIS — Z7951 Long term (current) use of inhaled steroids: Secondary | ICD-10-CM | POA: Diagnosis not present

## 2018-04-08 DIAGNOSIS — D703 Neutropenia due to infection: Secondary | ICD-10-CM | POA: Diagnosis not present

## 2018-04-08 DIAGNOSIS — I480 Paroxysmal atrial fibrillation: Secondary | ICD-10-CM | POA: Diagnosis not present

## 2018-04-08 DIAGNOSIS — M0510 Rheumatoid lung disease with rheumatoid arthritis of unspecified site: Secondary | ICD-10-CM | POA: Diagnosis not present

## 2018-04-08 DIAGNOSIS — Z7982 Long term (current) use of aspirin: Secondary | ICD-10-CM | POA: Diagnosis not present

## 2018-04-08 DIAGNOSIS — Z8619 Personal history of other infectious and parasitic diseases: Secondary | ICD-10-CM | POA: Diagnosis not present

## 2018-04-08 DIAGNOSIS — Z7952 Long term (current) use of systemic steroids: Secondary | ICD-10-CM | POA: Diagnosis not present

## 2018-04-08 DIAGNOSIS — B9689 Other specified bacterial agents as the cause of diseases classified elsewhere: Secondary | ICD-10-CM | POA: Diagnosis not present

## 2018-04-08 DIAGNOSIS — Z8701 Personal history of pneumonia (recurrent): Secondary | ICD-10-CM | POA: Diagnosis not present

## 2018-04-13 DIAGNOSIS — Z8619 Personal history of other infectious and parasitic diseases: Secondary | ICD-10-CM | POA: Diagnosis not present

## 2018-04-13 DIAGNOSIS — D703 Neutropenia due to infection: Secondary | ICD-10-CM | POA: Diagnosis not present

## 2018-04-13 DIAGNOSIS — Z8701 Personal history of pneumonia (recurrent): Secondary | ICD-10-CM | POA: Diagnosis not present

## 2018-04-13 DIAGNOSIS — Z7952 Long term (current) use of systemic steroids: Secondary | ICD-10-CM | POA: Diagnosis not present

## 2018-04-13 DIAGNOSIS — B9689 Other specified bacterial agents as the cause of diseases classified elsewhere: Secondary | ICD-10-CM | POA: Diagnosis not present

## 2018-04-13 DIAGNOSIS — I480 Paroxysmal atrial fibrillation: Secondary | ICD-10-CM | POA: Diagnosis not present

## 2018-04-13 DIAGNOSIS — M0510 Rheumatoid lung disease with rheumatoid arthritis of unspecified site: Secondary | ICD-10-CM | POA: Diagnosis not present

## 2018-04-13 DIAGNOSIS — Z9981 Dependence on supplemental oxygen: Secondary | ICD-10-CM | POA: Diagnosis not present

## 2018-04-13 DIAGNOSIS — M059 Rheumatoid arthritis with rheumatoid factor, unspecified: Secondary | ICD-10-CM | POA: Diagnosis not present

## 2018-04-13 DIAGNOSIS — Z7982 Long term (current) use of aspirin: Secondary | ICD-10-CM | POA: Diagnosis not present

## 2018-04-13 DIAGNOSIS — Z7951 Long term (current) use of inhaled steroids: Secondary | ICD-10-CM | POA: Diagnosis not present

## 2018-04-13 DIAGNOSIS — D708 Other neutropenia: Secondary | ICD-10-CM | POA: Diagnosis not present

## 2018-04-15 DIAGNOSIS — Z7982 Long term (current) use of aspirin: Secondary | ICD-10-CM | POA: Diagnosis not present

## 2018-04-15 DIAGNOSIS — D703 Neutropenia due to infection: Secondary | ICD-10-CM | POA: Diagnosis not present

## 2018-04-15 DIAGNOSIS — B9689 Other specified bacterial agents as the cause of diseases classified elsewhere: Secondary | ICD-10-CM | POA: Diagnosis not present

## 2018-04-15 DIAGNOSIS — Z7951 Long term (current) use of inhaled steroids: Secondary | ICD-10-CM | POA: Diagnosis not present

## 2018-04-15 DIAGNOSIS — M0510 Rheumatoid lung disease with rheumatoid arthritis of unspecified site: Secondary | ICD-10-CM | POA: Diagnosis not present

## 2018-04-15 DIAGNOSIS — Z8701 Personal history of pneumonia (recurrent): Secondary | ICD-10-CM | POA: Diagnosis not present

## 2018-04-15 DIAGNOSIS — I480 Paroxysmal atrial fibrillation: Secondary | ICD-10-CM | POA: Diagnosis not present

## 2018-04-15 DIAGNOSIS — Z8619 Personal history of other infectious and parasitic diseases: Secondary | ICD-10-CM | POA: Diagnosis not present

## 2018-04-15 DIAGNOSIS — Z9981 Dependence on supplemental oxygen: Secondary | ICD-10-CM | POA: Diagnosis not present

## 2018-04-15 DIAGNOSIS — Z7952 Long term (current) use of systemic steroids: Secondary | ICD-10-CM | POA: Diagnosis not present

## 2018-04-20 DIAGNOSIS — Z8701 Personal history of pneumonia (recurrent): Secondary | ICD-10-CM | POA: Diagnosis not present

## 2018-04-20 DIAGNOSIS — I480 Paroxysmal atrial fibrillation: Secondary | ICD-10-CM | POA: Diagnosis not present

## 2018-04-20 DIAGNOSIS — Z8619 Personal history of other infectious and parasitic diseases: Secondary | ICD-10-CM | POA: Diagnosis not present

## 2018-04-20 DIAGNOSIS — B9689 Other specified bacterial agents as the cause of diseases classified elsewhere: Secondary | ICD-10-CM | POA: Diagnosis not present

## 2018-04-20 DIAGNOSIS — Z7982 Long term (current) use of aspirin: Secondary | ICD-10-CM | POA: Diagnosis not present

## 2018-04-20 DIAGNOSIS — M0510 Rheumatoid lung disease with rheumatoid arthritis of unspecified site: Secondary | ICD-10-CM | POA: Diagnosis not present

## 2018-04-20 DIAGNOSIS — D703 Neutropenia due to infection: Secondary | ICD-10-CM | POA: Diagnosis not present

## 2018-04-20 DIAGNOSIS — Z7951 Long term (current) use of inhaled steroids: Secondary | ICD-10-CM | POA: Diagnosis not present

## 2018-04-20 DIAGNOSIS — Z7952 Long term (current) use of systemic steroids: Secondary | ICD-10-CM | POA: Diagnosis not present

## 2018-04-20 DIAGNOSIS — Z9981 Dependence on supplemental oxygen: Secondary | ICD-10-CM | POA: Diagnosis not present

## 2018-04-22 DIAGNOSIS — I480 Paroxysmal atrial fibrillation: Secondary | ICD-10-CM | POA: Diagnosis not present

## 2018-04-22 DIAGNOSIS — Z7952 Long term (current) use of systemic steroids: Secondary | ICD-10-CM | POA: Diagnosis not present

## 2018-04-22 DIAGNOSIS — M4854XA Collapsed vertebra, not elsewhere classified, thoracic region, initial encounter for fracture: Secondary | ICD-10-CM | POA: Diagnosis not present

## 2018-04-22 DIAGNOSIS — M546 Pain in thoracic spine: Secondary | ICD-10-CM | POA: Diagnosis not present

## 2018-04-22 DIAGNOSIS — Z7951 Long term (current) use of inhaled steroids: Secondary | ICD-10-CM | POA: Diagnosis not present

## 2018-04-22 DIAGNOSIS — Z8619 Personal history of other infectious and parasitic diseases: Secondary | ICD-10-CM | POA: Diagnosis not present

## 2018-04-22 DIAGNOSIS — D703 Neutropenia due to infection: Secondary | ICD-10-CM | POA: Diagnosis not present

## 2018-04-22 DIAGNOSIS — D708 Other neutropenia: Secondary | ICD-10-CM | POA: Diagnosis not present

## 2018-04-22 DIAGNOSIS — Z7982 Long term (current) use of aspirin: Secondary | ICD-10-CM | POA: Diagnosis not present

## 2018-04-22 DIAGNOSIS — B9689 Other specified bacterial agents as the cause of diseases classified elsewhere: Secondary | ICD-10-CM | POA: Diagnosis not present

## 2018-04-22 DIAGNOSIS — M059 Rheumatoid arthritis with rheumatoid factor, unspecified: Secondary | ICD-10-CM | POA: Diagnosis not present

## 2018-04-22 DIAGNOSIS — M0510 Rheumatoid lung disease with rheumatoid arthritis of unspecified site: Secondary | ICD-10-CM | POA: Diagnosis not present

## 2018-04-22 DIAGNOSIS — Z9981 Dependence on supplemental oxygen: Secondary | ICD-10-CM | POA: Diagnosis not present

## 2018-04-22 DIAGNOSIS — Z8701 Personal history of pneumonia (recurrent): Secondary | ICD-10-CM | POA: Diagnosis not present

## 2018-04-22 DIAGNOSIS — S32030A Wedge compression fracture of third lumbar vertebra, initial encounter for closed fracture: Secondary | ICD-10-CM | POA: Diagnosis not present

## 2018-04-23 MED ORDER — OXYCODONE-ACETAMINOPHEN 7.5 MG-325 MG TABLET
ORAL_TABLET | Freq: Four times a day (QID) | ORAL | 0 refills | 0 days | Status: CP | PRN
Start: 2018-04-23 — End: 2018-05-14

## 2018-04-24 MED ORDER — SYRINGE WITH NEEDLE 1 ML 27 X 1/2"
INJECTION | 6 refills | 0 days | Status: CP
Start: 2018-04-24 — End: ?

## 2018-04-28 MED ORDER — SYRINGE WITH NEEDLE 1 ML 25 GAUGE X 5/8"
INJECTION | Freq: Every day | 1 refills | 0.00000 days | Status: CP
Start: 2018-04-28 — End: 2018-06-16

## 2018-05-18 MED ORDER — OXYCODONE-ACETAMINOPHEN 7.5 MG-325 MG TABLET
ORAL_TABLET | Freq: Four times a day (QID) | ORAL | 0 refills | 0 days | Status: CP | PRN
Start: 2018-05-18 — End: 2018-05-19

## 2018-05-19 MED ORDER — OXYCODONE-ACETAMINOPHEN 7.5 MG-325 MG TABLET
ORAL_TABLET | Freq: Four times a day (QID) | ORAL | 0 refills | 0.00000 days | Status: CP | PRN
Start: 2018-05-19 — End: 2018-06-16

## 2018-05-19 MED ORDER — OXYCODONE-ACETAMINOPHEN 7.5 MG-325 MG TABLET: 1 | tablet | Freq: Four times a day (QID) | 0 refills | 0 days | Status: AC

## 2018-06-16 MED ORDER — PREDNISONE 10 MG TABLET
ORAL_TABLET | Freq: Every day | ORAL | 5 refills | 0 days | Status: CP
Start: 2018-06-16 — End: 2018-09-10

## 2018-06-16 MED ORDER — OXYCODONE-ACETAMINOPHEN 7.5 MG-325 MG TABLET
ORAL_TABLET | Freq: Four times a day (QID) | ORAL | 0 refills | 0.00000 days | Status: CP | PRN
Start: 2018-06-16 — End: 2018-07-21

## 2018-06-16 MED ORDER — PREDNISONE 1 MG TABLET
ORAL_TABLET | Freq: Every day | ORAL | 5 refills | 0.00000 days | Status: CP
Start: 2018-06-16 — End: 2018-12-10

## 2018-06-16 MED ORDER — METHOTREXATE SODIUM 25 MG/ML INJECTION SOLUTION
SUBCUTANEOUS | 6 refills | 0 days | Status: CP
Start: 2018-06-16 — End: 2018-12-10

## 2018-07-21 ENCOUNTER — Other Ambulatory Visit: Payer: Self-pay | Admitting: Gastroenterology

## 2018-07-21 MED ORDER — OXYCODONE-ACETAMINOPHEN 7.5 MG-325 MG TABLET: tablet | 0 refills | 0 days | Status: AC

## 2018-07-21 MED ORDER — TERIPARATIDE 20 MCG/DOSE (600 MCG/2.4 ML) SUBCUTANEOUS PEN INJECTOR
Freq: Every day | SUBCUTANEOUS | 11 refills | 0 days | Status: CP
Start: 2018-07-21 — End: 2019-07-21

## 2018-07-21 MED ORDER — OXYCODONE-ACETAMINOPHEN 7.5 MG-325 MG TABLET
ORAL_TABLET | ORAL | 0 refills | 0.00000 days | Status: CP
Start: 2018-07-21 — End: 2018-07-22

## 2018-07-21 NOTE — Unmapped (Signed)
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 Pt has an appt with his spine doctor on 11/14. Please call patient to discuss

## 2018-07-21 NOTE — Unmapped (Signed)
I spoke with the patient on 07/17/18 and 07/21/18.  He has osteoporosis and has experienced two vertebral fractures in the past 3 months ( T12 and L2). He is completing treatment with Miacalcin now and his spine specialist reportedly recommended Forteo but, recommended to get it through [facility] in hopes it would be less expensive. I sent a prescription through the [facility] shared services pharmacy for daily Forteo. We  Will obtain baseline calcium and vitamin D levels which he will get locally at Dr. Maricela Bo office (hematology).

## 2018-07-21 NOTE — Unmapped (Signed)
Per test claim for FORTEO at the [facility], patient needs Medication Assistance Program for Prior Authorization.

## 2018-07-22 MED ORDER — OXYCODONE-ACETAMINOPHEN 7.5 MG-325 MG TABLET: tablet | Freq: Four times a day (QID) | 0 refills | 0 days | Status: AC

## 2018-07-22 MED ORDER — OXYCODONE-ACETAMINOPHEN 7.5 MG-325 MG TABLET
ORAL_TABLET | Freq: Four times a day (QID) | ORAL | 0 refills | 0 days | Status: CP | PRN
Start: 2018-07-22 — End: 2018-07-22

## 2018-08-04 IMAGING — CT CT CHEST W/ CM
2 of 4 series · 15 of 36 positions shown, 18 images · IV contrast (APPLIED)
Comparison: Chest radiograph performed earlier today at [DATE] a.m.,
and CT of the chest performed 12/22/2017

CLINICAL DATA: Acute onset of generalized weakness and fever.
Decreased oral intake.

EXAM:
CT CHEST WITH CONTRAST
TECHNIQUE: Multidetector CT imaging of the chest was performed during
intravenous contrast administration.
CONTRAST:  100mL OMNIPAQUE IOHEXOL 300 MG/ML  SOLN

[Series 3: chest w · axial · 0.83mm/px · z∈[+1230,+1480]mm · 12 of 149 slices shown, 15 images]
[im 12/149  mediastinal]
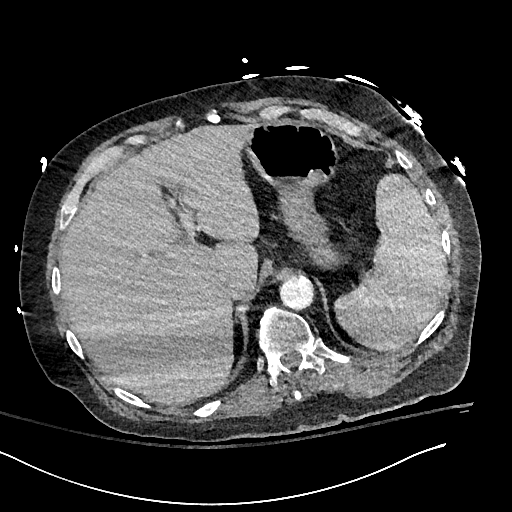
[im 12/149  lung]
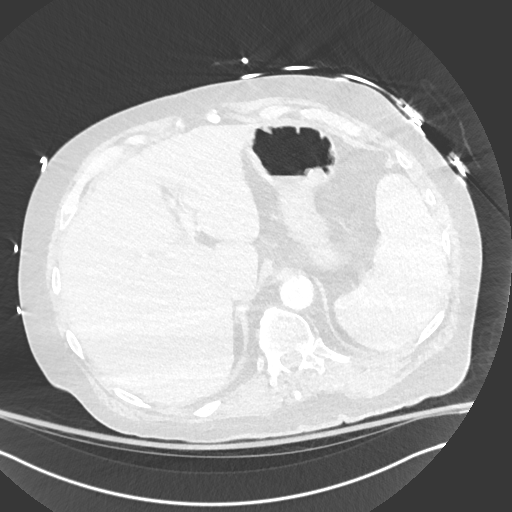
[im 23/149  lung]
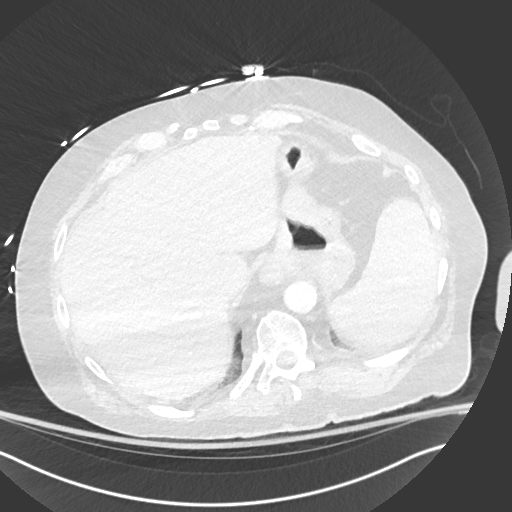
[im 35/149  lung]
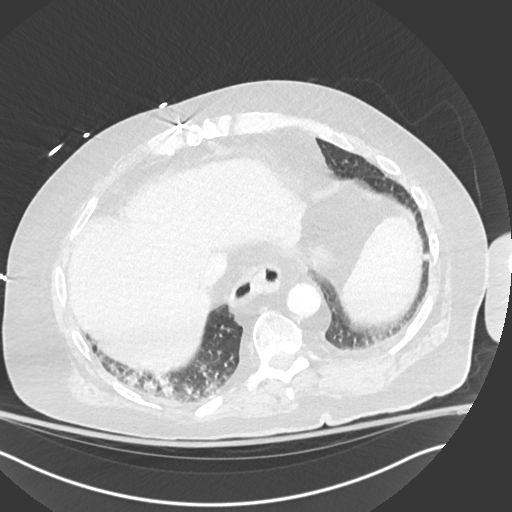
[im 46/149  lung]
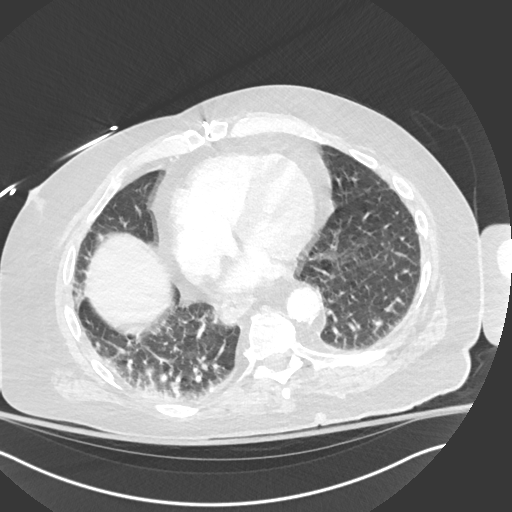
[im 57/149  mediastinal]
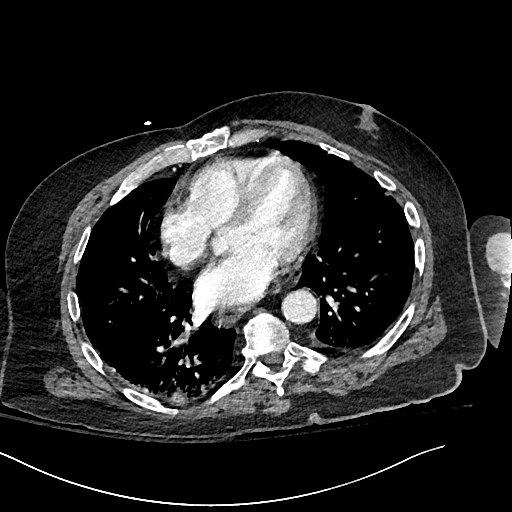
[im 57/149  lung]
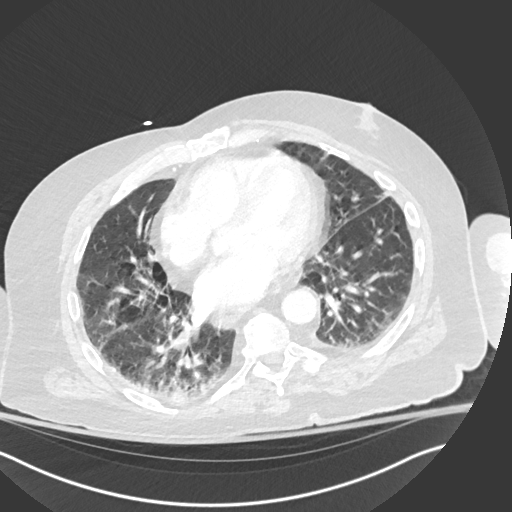
[im 69/149  lung]
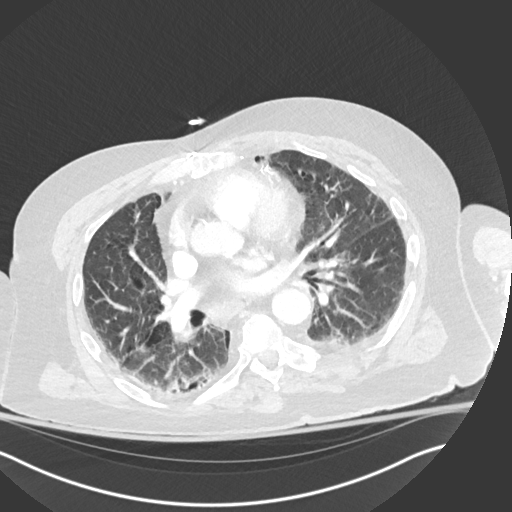
[im 80/149  lung]
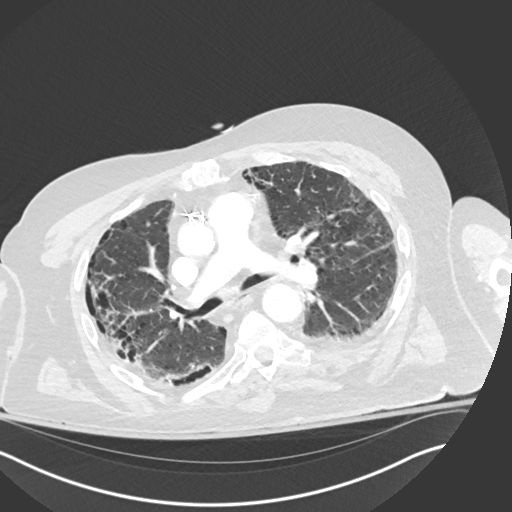
[im 92/149  lung]
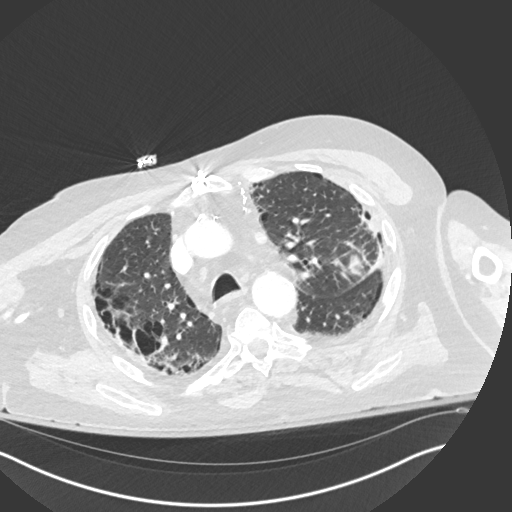
[im 103/149  mediastinal]
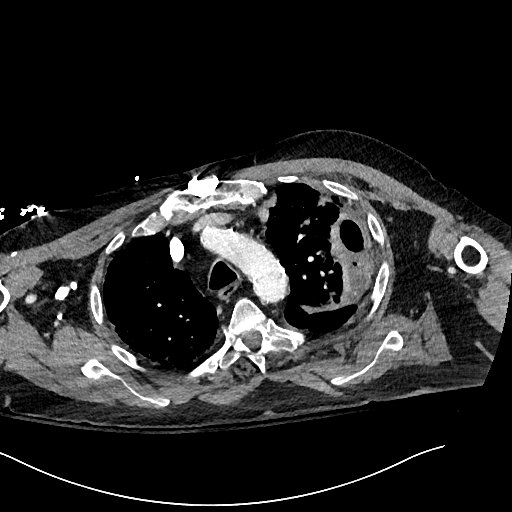
[im 103/149  lung]
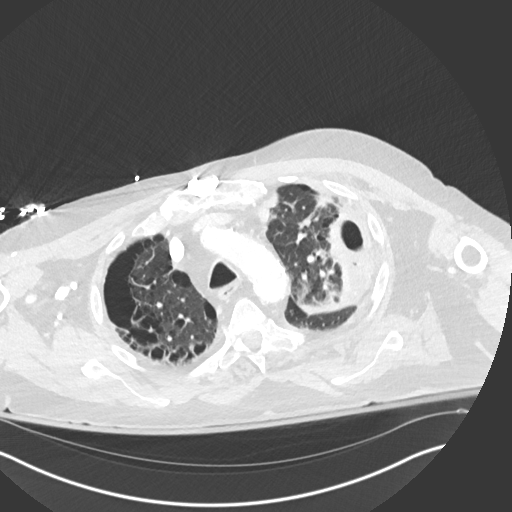
[im 114/149  lung]
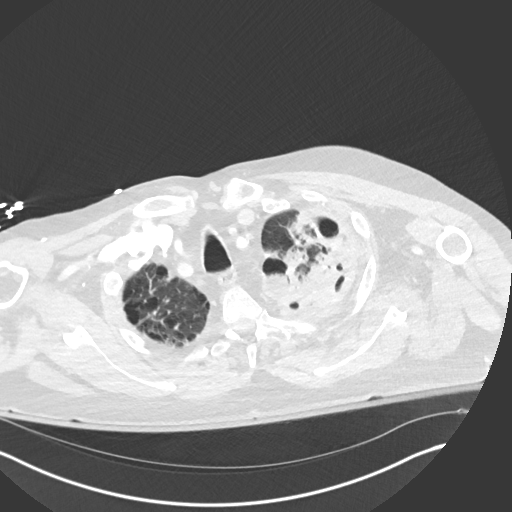
[im 126/149  lung]
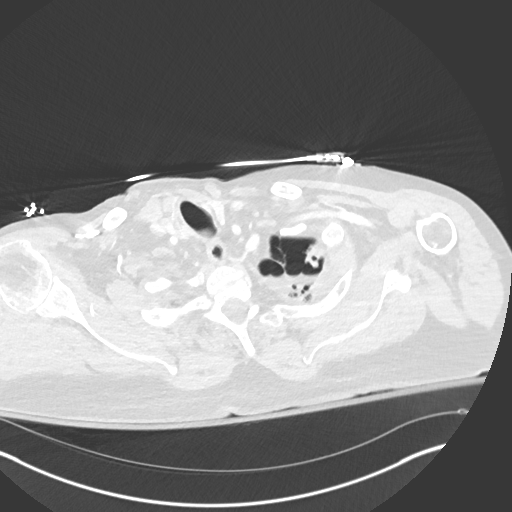
[im 137/149  lung]
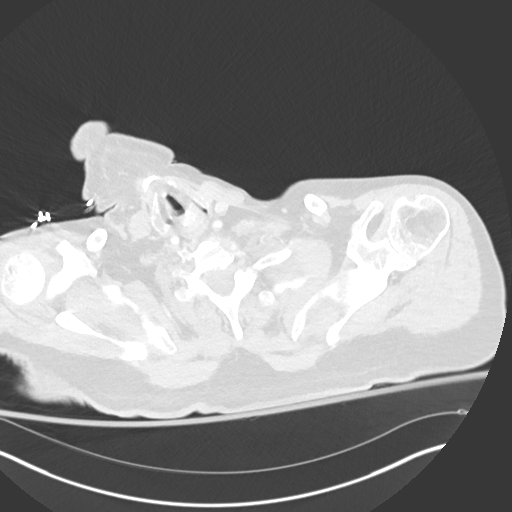

[Series 6: cor · coronal · 0.67mm/px · 3 of 151 slices shown]
[im 31/151  lung]
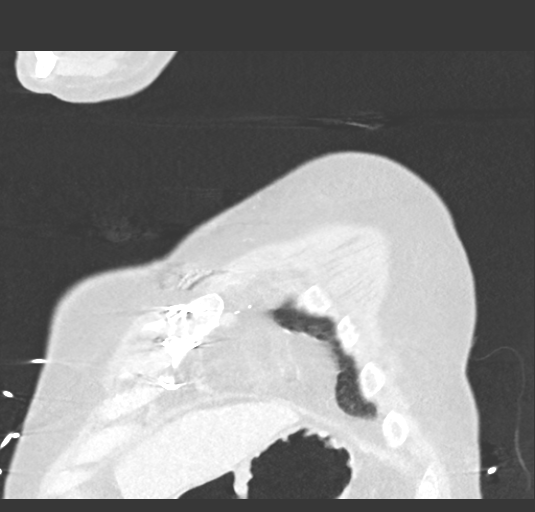
[im 61/151  lung]
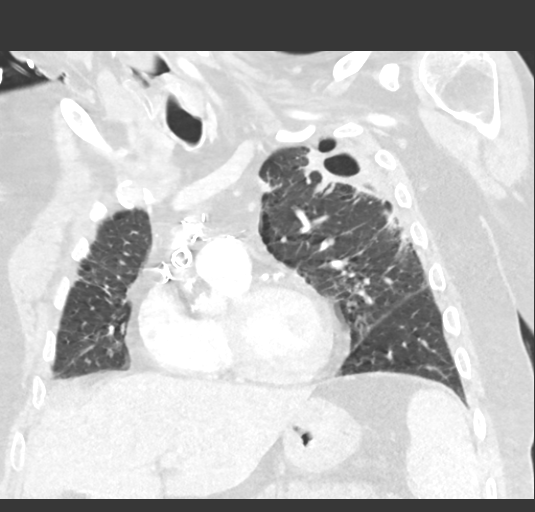
[im 91/151  lung]
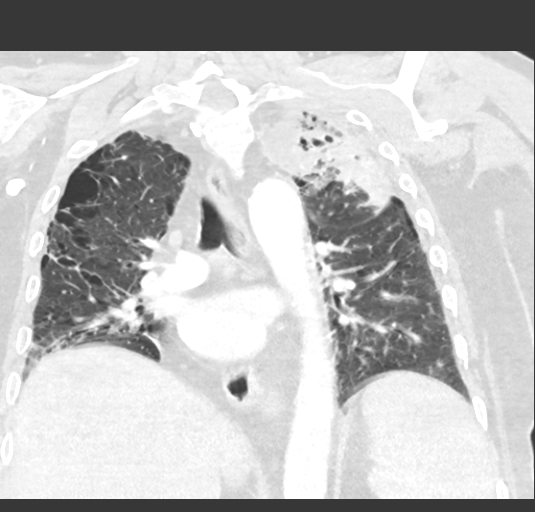

[15 of 36 positions shown; findings below may reference images not displayed]

FINDINGS: Cardiovascular: The heart is normal in size. Diffuse coronary artery
calcifications are seen. Scattered calcification noted along the
thoracic aorta and proximal great vessels. The descending thoracic
aorta is borderline normal in caliber.

Mediastinum/Nodes: The patient is status post median sternotomy.
Visualized mediastinal nodes remain borderline normal in size. No
pericardial effusion is identified. The thyroid gland is
unremarkable. No axillary lymphadenopathy is seen.

Lungs/Pleura: Multiple cavitations are noted at the left lung apex,
with new air-fluid levels, concerning for atypical infection
superimposed on the patient's chronic bullous disease. Peripheral
honeycombing is noted bilaterally. Mild interstitial prominence may
reflect mild interstitial edema.

No definite pleural effusion or pneumothorax is seen.

Upper Abdomen: The visualized portions of the liver and spleen are
unremarkable. The patient is status post cholecystectomy, with a
clip noted at the gallbladder fossa. The visualized portions of the
spleen are unremarkable. A tiny hiatal hernia is noted.

Musculoskeletal: No acute osseous abnormalities are identified. The
visualized musculature is unremarkable in appearance.
IMPRESSION: 1. Multiple cavitations noted at the left lung apex, with new
air-fluid levels, concerning for atypical infection superimposed on
chronic bullous disease.
2. Mild interstitial prominence may reflect mild interstitial edema.
3. Diffuse coronary artery calcifications seen.
4. Tiny hiatal hernia noted.

## 2018-08-11 IMAGING — DX DG ABD PORTABLE 1V
2 series · 2 of 2 positions shown · non-contrast
Comparison: Radiograph August 14, 2017.

CLINICAL DATA: Acute generalized abdominal pain.

EXAM:
PORTABLE ABDOMEN - 1 VIEW

[abdomen kub (1 of 2)]
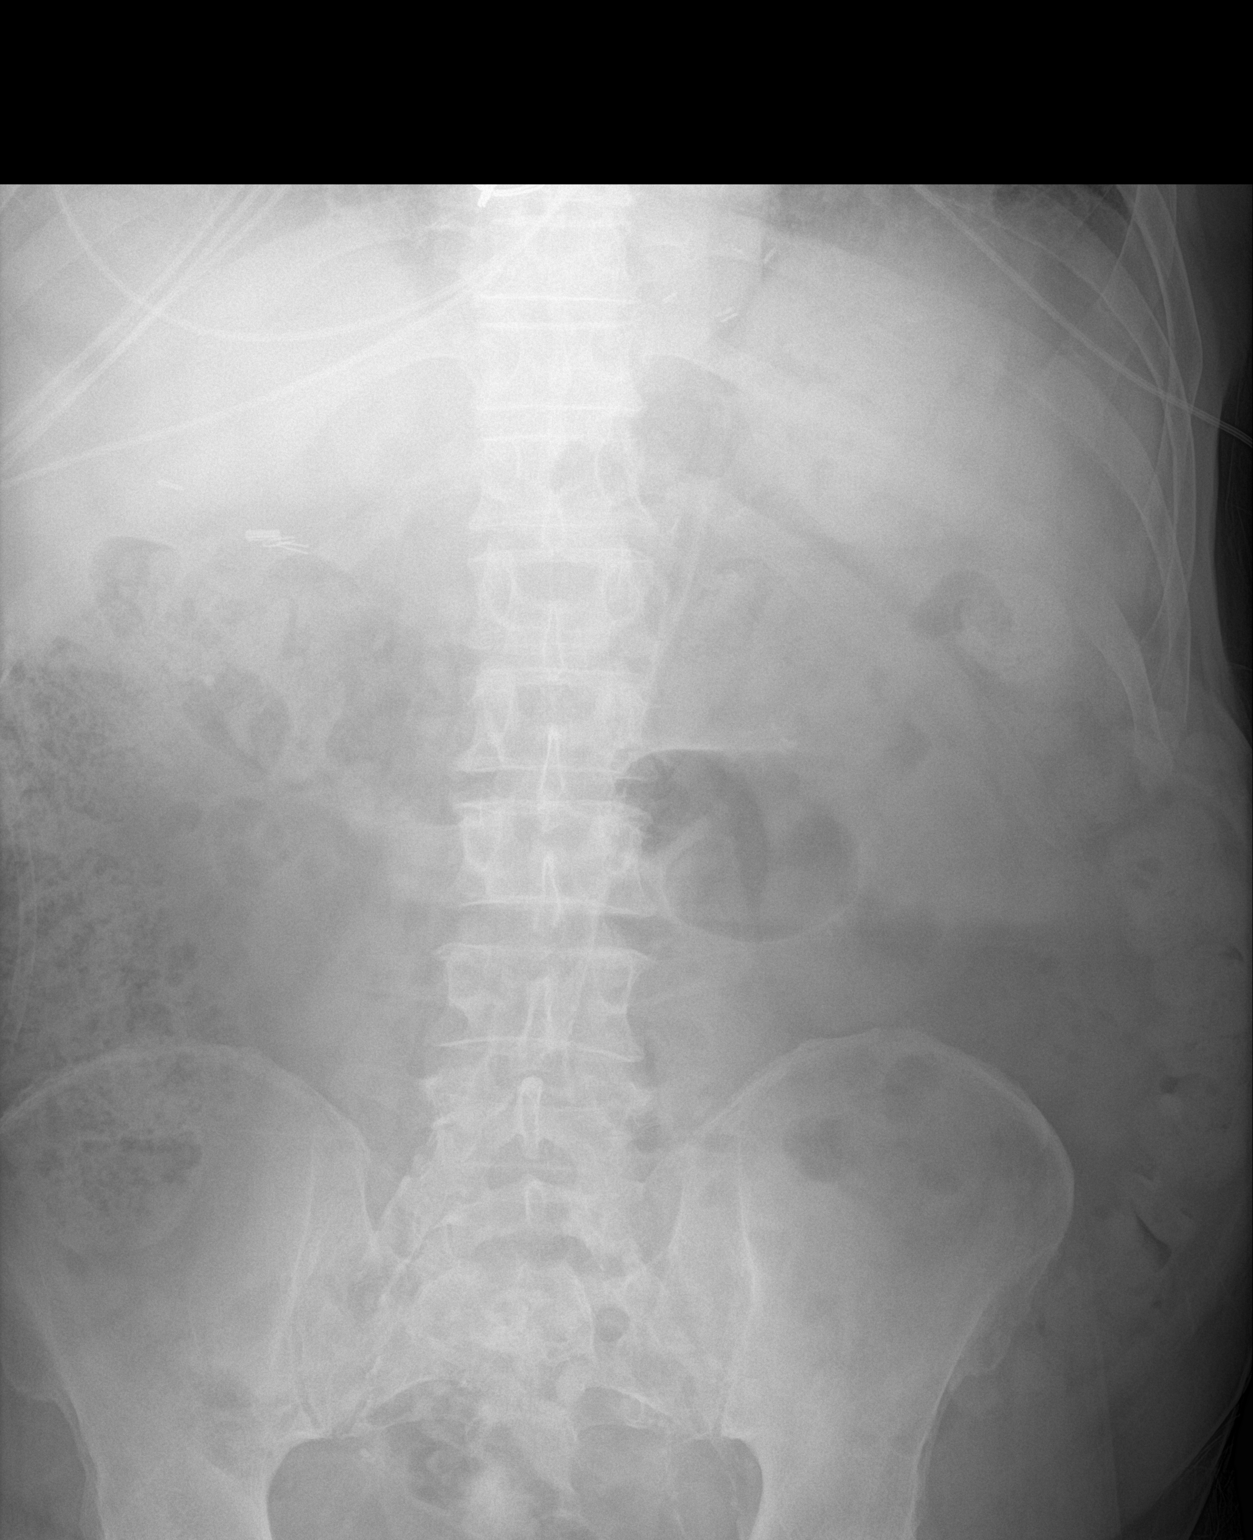

[abdomen kub (2 of 2)]
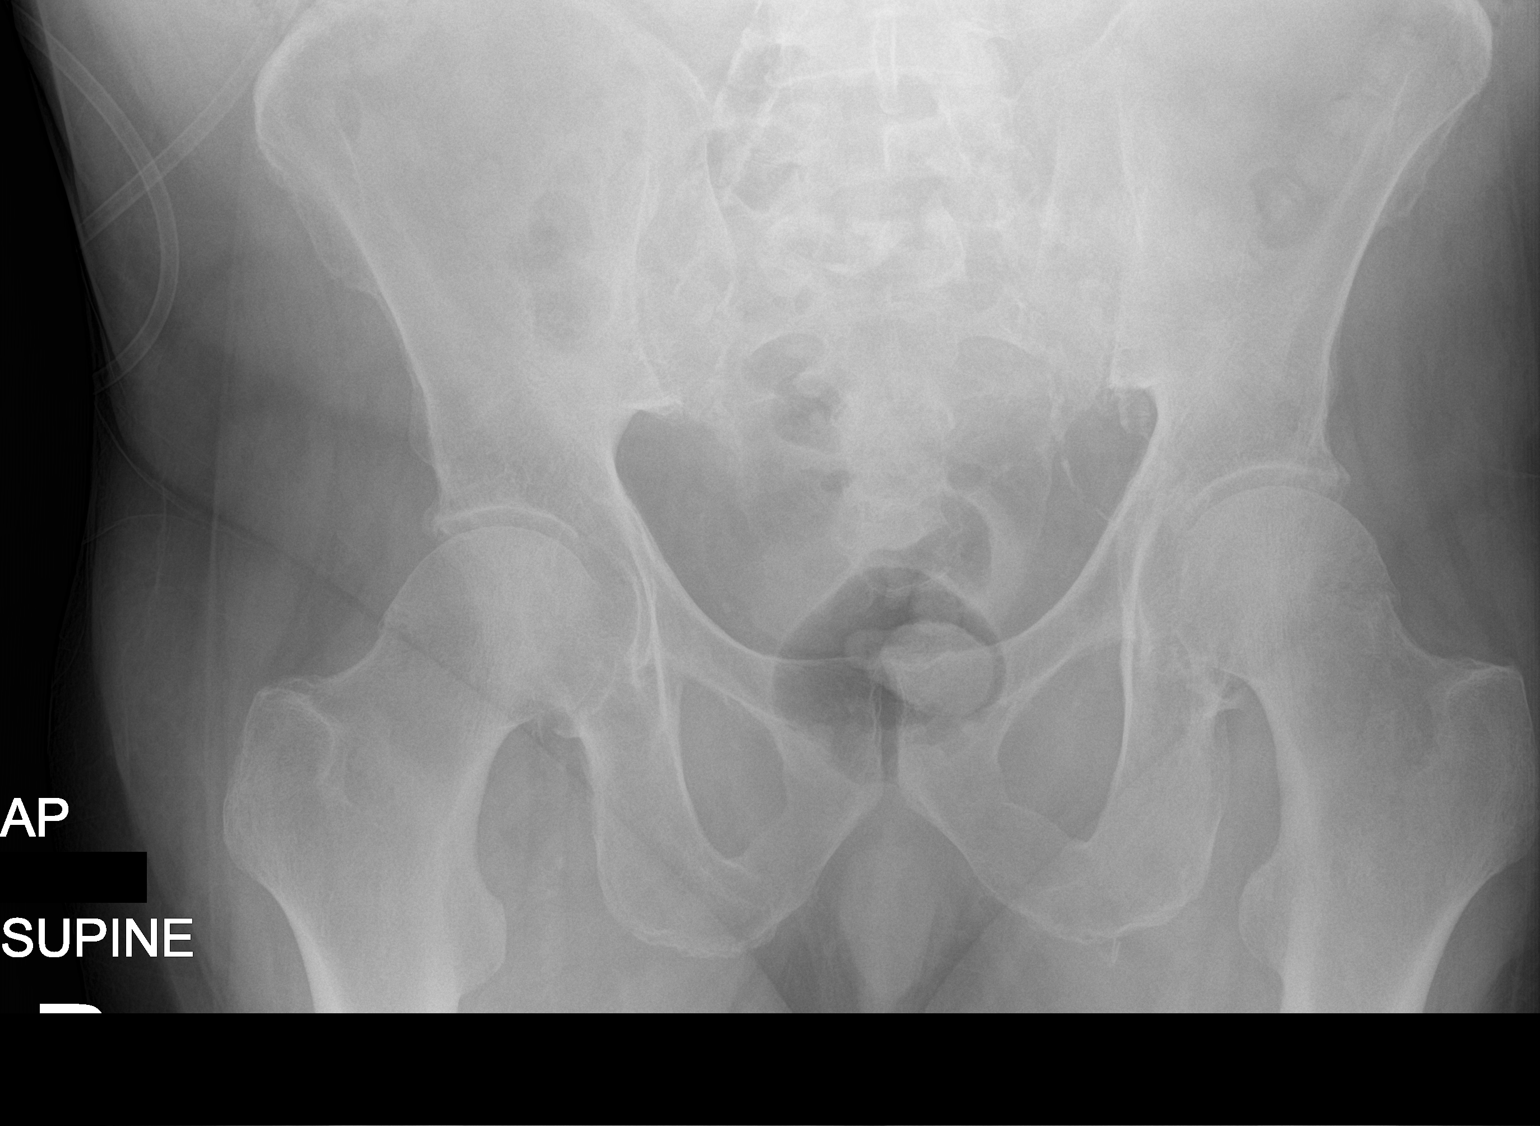

[2 of 2 positions shown; findings below may reference images not displayed]

FINDINGS: No abnormal bowel dilatation is noted. Moderate amount of stool seen
in the right colon. No radio-opaque calculi or other significant
radiographic abnormality are seen. Status post cholecystectomy.
IMPRESSION: Moderate stool burden.  No evidence of bowel obstruction or ileus.

## 2018-08-20 ENCOUNTER — Other Ambulatory Visit: Payer: Self-pay | Admitting: Gastroenterology

## 2018-08-21 ENCOUNTER — Other Ambulatory Visit: Payer: Self-pay | Admitting: Gastroenterology

## 2018-08-27 NOTE — Unmapped (Signed)
Entered as error

## 2018-08-27 NOTE — Unmapped (Signed)
Reason for call:       Last ov: 03/20/2018  Next ov: Visit date not found

## 2018-09-10 ENCOUNTER — Ambulatory Visit: Admit: 2018-09-10 | Discharge: 2018-09-11 | Payer: MEDICARE | Attending: Rheumatology | Primary: Rheumatology

## 2018-09-10 DIAGNOSIS — M05742 Rheumatoid arthritis with rheumatoid factor of left hand without organ or systems involvement: Secondary | ICD-10-CM

## 2018-09-10 DIAGNOSIS — J3489 Other specified disorders of nose and nasal sinuses: Principal | ICD-10-CM

## 2018-09-10 DIAGNOSIS — S22080D Wedge compression fracture of T11-T12 vertebra, subsequent encounter for fracture with routine healing: Secondary | ICD-10-CM

## 2018-09-10 DIAGNOSIS — M05741 Rheumatoid arthritis with rheumatoid factor of right hand without organ or systems involvement: Secondary | ICD-10-CM

## 2018-09-10 DIAGNOSIS — M059 Rheumatoid arthritis with rheumatoid factor, unspecified: Secondary | ICD-10-CM

## 2018-09-10 MED ORDER — LOTEPREDNOL ETABONATE 0.5 % EYE GEL DROPS
Freq: Every evening | OPHTHALMIC | 6 refills | 0.00000 days | Status: CP
Start: 2018-09-10 — End: 2018-09-11

## 2018-09-10 MED ORDER — PREDNISONE 10 MG TABLET
ORAL_TABLET | Freq: Every day | ORAL | 5 refills | 0.00000 days | Status: CP
Start: 2018-09-10 — End: 2019-02-26

## 2018-09-10 MED ORDER — OXYCODONE-ACETAMINOPHEN 7.5 MG-325 MG TABLET
ORAL_TABLET | ORAL | 0 refills | 0.00000 days | Status: CP
Start: 2018-09-10 — End: 2018-09-15

## 2018-09-10 MED ORDER — OXYCODONE-ACETAMINOPHEN 7.5 MG-325 MG TABLET: tablet | 0 refills | 0 days | Status: AC

## 2018-09-10 NOTE — Unmapped (Signed)
Chief Complaint: improved joint pain.       HPI:    The patient is a 68 y.o. 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    In the second half of 2018 he developed neutropenia and mild thrombocytopenia that did not respond to discontinuation of MTX at the time and eventually led to recurrent infections/sepsis requiring multiple hospital admissions. See previous notes.      He was evaluated by [facility] hematology and in June of 2019 underwent a bone Marrow aspiration which is shown below. It was Dr. 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 He also suspected an immune deficiency perhaps a neutrophil dysfunction as the quantitative immunoglobulins were not reduced. Additionally, the immunophenotyping did not suggest LGL leukemia as noted below.     The patient eventually moved to the [facility] area of [place] and has established PCP and a new hematologist there, Dr. Deboraha Sprang with whom I have been coordinating his care. Dr. Deboraha Sprang recommended an evaluation by an immunologist and referred him locally however, that immunologist would not take the patient's insurance and they could not afford it out of pocket. He was referred to [facility] for second opinion regarding autoimmune neutropenia and whether rituximab would be a treatment option.    Last seen in clinic July 2019. Last telephone encounter 06/16/18.  Patient saw Dr. Latrelle Dodrill at [facility] yesterday 1/15. Per patient and wife, Dr. 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 Based on this differential, she felt that it would be worth trying rituximab to see if this would affect his ANC. Patient had labs drawn yesterday that showed elevated WBC count, elevated neutrophils (received Granix on 1/13), elevated CRP. Normal ESR.    Continues to receive Granix every Monday. The patient was originally taking MTX the same day as his Granix injections, but decided in October to take taking MTX every Thursday as opposed to every Monday. After making his change, he noticed an improvement in his ANC. Recently, his ANC counts have improved to 4-5 when previously they would be 1-2. They have discussed with his Hematologist possibly spacing out Granix injections to every 10 days, but waiting to see Dr. Latrelle Dodrill and [place] before making that decision.    In terms of medications, he started Forteo injections this past weekend. He is on MTX 20 mg/week and prednisone 10mg  daily. His joint symptoms have remained quiet since being back on the MTX. Denies any joint pain, swelling, or stiffness. He is recovering from an acute thoracic compression fracture in Nov 2019.    Other interim events since his last visit include an episode of hematuria at the beginning of December. Patient passed a kidney stone with improvement in his hematuria. CT scan showed that he has 5 smaller retained kidney stones. Had an episode of epistaxis that lasted 8 hours early in January. Was found to have a septal perforation that was cauterized. Saw ENT on 08/31/18. Etiology of perforation unknown. No biopsy taken. No further episodes. Noticed a new rash on his thighs in the last 2 or 3 days. The rash is itchy. Just started Granix injections  this past Saturday    ??  Collected:  01/26/2018 13:30 Status:  Final result ????Visible to patient:  No (Not Released) Dx:  Other neutropenia (CMS-HCC)   Component    Final Diagnosis   Bone marrow, left iliac, aspiration and biopsy  -  Subcortical sampling of cellular and crushed bone marrow (60%) with trilineage hematopoiesis and no evidence of lymphoid malignancy.  -  Cytogenetic studies are shown below.  ??  Peripheral blood, smear review and flow cytometry  - No monotypic B-cell or aberrant T-cell population identified by flow cytometry analysis.  ??  This electronic signature is attestation that the pathologist personally reviewed the submitted material(s) and the final diagnosis reflects that evaluation.   Electronically signed by Berdine Dance, MD on 01/27/2018 at 1514   Clinical History    The patient is a 68 year old male with a history of rheumatoid arthritis who has leukopenia and mild thrombocytopenia, current medications include G-CSF.    Gross Description    Received are left iliac aspirate and left biopsy measuring 0.2 cm x 0.4 cm. Cytogenetic studies are requested. Flow cytometric analysis is performed on the peripheral blood.  ??  A. BMAL. Aspirate is submitted in one block(s).  ??  B. BMBL. Biopsy is submitted in one block(s)..   Microscopic Description    Microscopic examination substantiates the above diagnosis.  ??  Peripheral Blood:  Platelets:  Adequate  Erythroid:  Mild anisocytosis   Leukocytes:  Lymphopenia; rare myelocytes   ??  Bone Marrow Aspirate:  Cellularity:  Cellular marrow particles  Megakaryocytes:  Adequate in number  Erythropoiesis:  Orderly maturation, mild dyserythropoiesis  Granulopoiesis:  Orderly maturation  Other:   Scattered eosinophils   ??  M:E ratio: 1.4:1 Differential: 500 cells counted/lc  ??  1% blasts  7% promyelocytes  9% myelocytes  28% maturing granulocytes  37% erythroid  10% lymphocytes  3% monocytes  2% eosinophils  1% basophils  2% plasma cells   ??  Iron Stain:         Storage:       Increased                            Erythroid:    Increased; no ring sideroblasts are identified   ??  Touch Prep:  Confirmatory  ??  Bone Marrow Clot and/or Biopsy:    The bone marrow clot and biopsy sections are confirmatory with erythroid-predominant trilineage hematopoiesis; the biopsy shows subcortical sampling of cortical and crushed bone marrow. There is no morphologic evidence of malignant lymphoma.         Cellularity:        Clot: 60%          Biopsy: 60%           Flow Cytometry Summary       Flow Cytometric Immunophenotyping Results: ?? ??Peripheral blood   Heme WBC 4,600 cells/uL     Flow Differential (% of Total)   Total of Markers Charged 15   Markers-1 Charged 14     Gated Population: 14% Lymphocytes     Description of Gated Cells (% of Gated)     B-Cell Markers: ??   CD19 4%   CD20 7%   Kappa 2%   Lambda 2%     T-Cell Markers: ??   CD2 95%   CD3 93%   CD3/CD4 25%   Total CD4 25%   CD5 93%  CD7 91%   CD3/CD8 65%   Total CD8 67%     Miscellaneous: ??   CD10 <1%   CD38 43%   CD45 100% Bright   CD57 36%   CD3-/CD16+56 2%   Total CD16+56 3%     Dual Staining:   CD3+/CD57+ 36%   CD8+/CD57+ 33%     Flow Cytometry Interpretation:     Flow cytometric analysis of the peripheral blood reveals a decreased white blood cell count (4,600 cells/uL).   ??   A population of 14% cells is gated on the lymphocyte region and is composed of 93% T-cells (CD4: CD8 ratio is 0.4:1) without an aberrant immunophenotype, 4% polytypic B-cells, and 2% NK-cells.     A subset of the T-cells (33% of gated cells, ~5% of total) expresses CD8 and CD57, compatible with large granular lymphocytes of T-cell origin.     Flow cytometric analysis fails to reveal a monotypic B-cell, aberrant T-cell, or expanded large granular lymphocyte population.        Cytogenetics AP Order: [id number]   Order: [id number] - Reflex for Order [id number]   Status:  Final result ????Visible to patient:  Yes (MyChart) Dx:  Other neutropenia (CMS-HCC)   Component    RESULTS    Normal karyotype: 46,XY[20]  ??   Interpretation   Cytogenetic analysis revealed the presence of a normal male chromosome complement with no observed clonal chromosomal abnormalities.  ??   Electronically signed by Diamantina Monks, MD on 02/17/2018 at 1302   Preliminary result electronically signed by Threasa Beards, PhD for Diamantina Monks, MD on 02/10/2018 at 1344   No.Cells 20    Culture(s) Examined 2    Cells Analyzed 18    Cells Karyotyped 2    Stain(s) Used G-Bands    Indication for Study    Pancytopenia              EXAM:  CT CHEST, ABDOMEN AND PELVIS WITHOUT CONTRAST    TECHNIQUE:  Multidetector CT imaging of the chest, abdomen and pelvis was  performed following the standard protocol without IV contrast.    COMPARISON:????08/14/2017, 10/11/2009    FINDINGS:  CT CHEST FINDINGS    Cardiovascular: Limited without intravenous contrast.  Moderate-to-marked atherosclerotic calcification of the aorta. No  aneurysmal dilatation. Post CABG changes. Coronary artery  calcification. Heart size upper normal. No pericardial effusion.    Mediastinum/Nodes: Endotracheal tube tip terminate just above the  carina. Esophageal tube tip is within the gastric outlet. No thyroid  mass. Nonspecific subcentimeter mediastinal lymph nodes.    Lungs/Pleura: Trace pleural effusion. Atelectasis within the  posterior lung bases. Moderate to marked emphysema with large bulla  in the upper lobes. Negative for a pneumothorax.    Musculoskeletal: Postsurgical changes of the sternum. Degenerative  changes of the spine. No acute or suspicious lesion    CT ABDOMEN PELVIS FINDINGS    Hepatobiliary: Surgical clips in the gallbladder fossa. No focal  hepatic abnormality or biliary dilatation. Suspected small stones in  the distal common bile duct, measuring up to 7 mm.    Pancreas: Unremarkable. No pancreatic ductal dilatation or  surrounding inflammatory changes.    Spleen: Normal in size without focal abnormality.    Adrenals/Urinary Tract: Adrenal glands are within normal limits. No  hydronephrosis. Nonspecific perinephric fat stranding. Multiple  right greater than left intrarenal calculi, largest stone on the  right is seen in the upper pole and measures 6 mm. Largest stone on  the  left is seen in the upper pole and measures 5 mm. Foley catheter  and air in the empty bladder.    Stomach/Bowel: Stomach is within normal limits. Appendix appears  normal. No evidence of bowel wall thickening, distention, or  inflammatory changes.    Vascular/Lymphatic: Moderate to marked atherosclerotic vascular  disease. No aneurysmal dilatation. No significant abdominal or  pelvic adenopathy    Reproductive: Prostate is unremarkable.    Other: Small fat in the inguinal canals. No free air or free fluid.  Small fat in the umbilicus.    Musculoskeletal: Degenerative changes. No acute or suspicious  abnormality    IMPRESSION:  1. Support lines and tubes as above. Tip of the endotracheal tube  terminates just above the carina.  2. Trace pleural effusions and atelectasis in the bilateral lung  bases. Moderate severe emphysema  3. No CT evidence for acute intra-abdominal or pelvic abnormality  4. Status post cholecystectomy. Suspect that there is  choledocholithiasis. No significant biliary dilatation.  5. Multiple bilateral nonobstructing stones within the kidneys.          Appointment on 01/30/2018   Component Date Value Ref Range Status   ??? WBC 01/30/2018 17.0* 4.5 - 11.0 10*9/L Final   ??? RBC 01/30/2018 4.43* 4.50 - 5.90 10*12/L Final   ??? HGB 01/30/2018 12.2* 13.5 - 17.5 g/dL Final   ??? HCT 16/05/9603 38.0* 41.0 - 53.0 % Final   ??? MCV 01/30/2018 85.8  80.0 - 100.0 fL Final   ??? MCH 01/30/2018 27.5  26.0 - 34.0 pg Final   ??? MCHC 01/30/2018 32.0  31.0 - 37.0 g/dL Final   ??? RDW 54/04/8118 18.0* 12.0 - 15.0 % Final   ??? MPV 01/30/2018 8.4  7.0 - 10.0 fL Final   ??? Platelet 01/30/2018 163  150 - 440 10*9/L Final   ??? Variable HGB Concentration 01/30/2018 Slight* Not Present Final   ??? Neutrophils % 01/30/2018 86.7  % Final   ??? Lymphocytes % 01/30/2018 8.1  % Final   ??? Monocytes % 01/30/2018 3.4  % Final   ??? Eosinophils % 01/30/2018 0.1  % Final   ??? Basophils % 01/30/2018 0.4  % Final   ??? Neutrophil Left Shift 01/30/2018 2+* Not Present Final   ??? Absolute Neutrophils 01/30/2018 14.7* 2.0 - 7.5 10*9/L Final   ??? Absolute Lymphocytes 01/30/2018 1.4* 1.5 - 5.0 10*9/L Final   ??? Absolute Monocytes 01/30/2018 0.6  0.2 - 0.8 10*9/L Final   ??? Absolute Eosinophils 01/30/2018 0.0  0.0 - 0.4 10*9/L Final   ??? Absolute Basophils 01/30/2018 0.1  0.0 - 0.1 10*9/L Final   ??? Large Unstained Cells 01/30/2018 1  0 - 4 % Final   ??? Microcytosis 01/30/2018 Slight* Not Present Final   ??? Anisocytosis 01/30/2018 Slight* Not Present Final   ??? Hypochromasia 01/30/2018 Slight* Not Present Final   Office Visit on 01/28/2018   Component Date Value Ref Range Status   ??? Total IgG 01/28/2018 870  600-1,700 mg/dL Final   ??? IgM 14/78/2956 451* 35 - 290 mg/dL Final   ??? IgA 21/30/8657 323.2  40.0 - 400.0 mg/dL Final   Appointment on 01/28/2018   Component Date Value Ref Range Status   ??? AST 01/28/2018 30  19 - 55 U/L Final   ??? ALT 01/28/2018 24  19 - 72 U/L Final   ??? Albumin 01/28/2018 3.7  3.5 - 5.0 g/dL Final   ??? Creatinine 01/28/2018 0.63* 0.70 - 1.30 mg/dL Final   ???  EGFR MDRD Af Amer 01/28/2018 >=60  >=60 mL/min/1.31m2 Final   ??? EGFR MDRD Non Af Amer 01/28/2018 >=60  >=60 mL/min/1.40m2 Final   ??? WBC 01/28/2018 3.4* 4.5 - 11.0 10*9/L Final   ??? RBC 01/28/2018 4.25* 4.50 - 5.90 10*12/L Final   ??? HGB 01/28/2018 12.1* 13.5 - 17.5 g/dL Final   ??? HCT 45/40/9811 36.9* 41.0 - 53.0 % Final   ??? MCV 01/28/2018 86.9  80.0 - 100.0 fL Final   ??? MCH 01/28/2018 28.4  26.0 - 34.0 pg Final   ??? MCHC 01/28/2018 32.7  31.0 - 37.0 g/dL Final   ??? RDW 91/47/8295 19.0* 12.0 - 15.0 % Final   ??? MPV 01/28/2018 7.8  7.0 - 10.0 fL Final   ??? Platelet 01/28/2018 210  150 - 440 10*9/L Final   ??? Variable HGB Concentration 01/28/2018 Slight* Not Present Final   ??? Neutrophils % 01/28/2018 52.4  % Final   ??? Lymphocytes % 01/28/2018 33.9  % Final   ??? Monocytes % 01/28/2018 7.7  % Final   ??? Eosinophils % 01/28/2018 0.8  % Final   ??? Basophils % 01/28/2018 0.5  % Final   ??? Absolute Neutrophils 01/28/2018 1.8* 2.0 - 7.5 10*9/L Final   ??? Absolute Lymphocytes 01/28/2018 1.2* 1.5 - 5.0 10*9/L Final   ??? Absolute Monocytes 01/28/2018 0.3  0.2 - 0.8 10*9/L Final   ??? Absolute Eosinophils 01/28/2018 0.0  0.0 - 0.4 10*9/L Final   ??? Absolute Basophils 01/28/2018 0.0  0.0 - 0.1 10*9/L Final   ??? Large Unstained Cells 01/28/2018 5* 0 - 4 % Final   ??? Anisocytosis 01/28/2018 Moderate* Not Present Final   ??? Hypochromasia 01/28/2018 Slight* Not Present Final   ??? Cytogenetics Test, Other 01/26/2018 Collected   Final   Lab on 01/26/2018   Component Date Value Ref Range Status   ??? WBC 01/26/2018 4.6  4.5 - 11.0 10*9/L Final   ??? RBC 01/26/2018 4.39* 4.50 - 5.90 10*12/L Final   ??? HGB 01/26/2018 12.3* 13.5 - 17.5 g/dL Final   ??? HCT 62/13/0865 37.6* 41.0 - 53.0 % Final   ??? MCV 01/26/2018 85.6  80.0 - 100.0 fL Final   ??? MCH 01/26/2018 28.0  26.0 - 34.0 pg Final   ??? MCHC 01/26/2018 32.8  31.0 - 37.0 g/dL Final   ??? RDW 78/46/9629 17.6* 12.0 - 15.0 % Final   ??? MPV 01/26/2018 8.2  7.0 - 10.0 fL Final   ??? Platelet 01/26/2018 244  150 - 440 10*9/L Final   ??? Variable HGB Concentration 01/26/2018 Slight* Not Present Final   ??? Neutrophils % 01/26/2018 73.5  % Final   ??? Lymphocytes % 01/26/2018 17.0  % Final   ??? Monocytes % 01/26/2018 6.9  % Final   ??? Eosinophils % 01/26/2018 0.4  % Final   ??? Basophils % 01/26/2018 0.6  % Final   ??? Absolute Neutrophils 01/26/2018 3.4  2.0 - 7.5 10*9/L Final   ??? Absolute Lymphocytes 01/26/2018 0.8* 1.5 - 5.0 10*9/L Final   ??? Absolute Monocytes 01/26/2018 0.3  0.2 - 0.8 10*9/L Final   ??? Absolute Eosinophils 01/26/2018 0.0  0.0 - 0.4 10*9/L Final   ??? Absolute Basophils 01/26/2018 0.0  0.0 - 0.1 10*9/L Final   ??? Large Unstained Cells 01/26/2018 2  0 - 4 % Final   ??? Microcytosis 01/26/2018 Slight* Not Present Final   ??? Anisocytosis 01/26/2018 Slight* Not Present Final   ??? Hypochromasia 01/26/2018 Slight* Not Present Final   ???  Smear Review Comments 01/26/2018 See Comment* Undefined Final    Smear Reviewed  Myelocytes present-rare.     Orders Only on 12/10/2017 Component Date Value Ref Range Status   ??? WBC 12/10/2017 3.4  3.4 - 10.8 x10E3/uL Final   ??? RBC 12/10/2017 4.56  4.14 - 5.80 x10E6/uL Final   ??? HGB 12/10/2017 13.2  13.0 - 17.7 g/dL Final   ??? HCT 16/05/9603 39.7  37.5 - 51.0 % Final   ??? MCV 12/10/2017 87  79 - 97 fL Final   ??? MCH 12/10/2017 28.9  26.6 - 33.0 pg Final   ??? MCHC 12/10/2017 33.2  31.5 - 35.7 g/dL Final   ??? RDW 54/04/8118 16.0* 12.3 - 15.4 % Final   ??? Platelet 12/10/2017 173  150 - 379 x10E3/uL Final   ??? Neutrophils % 12/10/2017 36  Not Estab. % Final   ??? Lymphocytes % 12/10/2017 46  Not Estab. % Final   ??? Monocytes % 12/10/2017 18  Not Estab. % Final   ??? Eosinophils % 12/10/2017 0  Not Estab. % Final   ??? Basophils % 12/10/2017 0  Not Estab. % Final   ??? Absolute Neutrophils 12/10/2017 1.2* 1.4 - 7.0 x10E3/uL Final   ??? Absolute Lymphocytes 12/10/2017 1.6  0.7 - 3.1 x10E3/uL Final   ??? Absolute Monocytes  12/10/2017 0.6  0.1 - 0.9 x10E3/uL Final   ??? Absolute Eosinophils 12/10/2017 0.0  0.0 - 0.4 x10E3/uL Final   ??? Absolute Basophils  12/10/2017 0.0  0.0 - 0.2 x10E3/uL Final   ??? Immature Granulocytes 12/10/2017 0  Not Estab. % Final   ??? Bands Absolute 12/10/2017 0.0  0.0 - 0.1 x10E3/uL Final   Appointment on 11/20/2017   Component Date Value Ref Range Status   ??? Granulocyte Agg 11/20/2017 Negative  Negative Final   ??? Granulocyte IFA 11/20/2017 Negative  Negative Final   ??? Interpretation Neut Ab 11/20/2017 SEE COMMENT   Final    No granulocyte antibodies were detected in the GA or GIF assays.   ??? LDH 11/20/2017 505  338 - 610 U/L Final   ]    ROS:  10 systems were reviewed and negative except as noted in the HPI.    Past Medical History:   Diagnosis Date   ??? CAD (coronary artery disease)    ??? CHF (congestive heart failure) (CMS-HCC)    ??? COPD (chronic obstructive pulmonary disease) (CMS-HCC)    ??? Diabetes mellitus (CMS-HCC) 2008    Type 2   ??? DJD (degenerative joint disease)    ??? GERD (gastroesophageal reflux disease)    ??? Hyperlipidemia    ??? Hypertension ??? MI (myocardial infarction) (CMS-HCC) 1999, 2077   ??? Osteomyelitis of foot (CMS-HCC)    ??? Osteoporosis 2012   ??? Peripheral neuropathy    ??? Pneumonia, aspiration (CMS-HCC)    ??? Psoriasis    ??? RA (rheumatoid arthritis) (CMS-HCC)    ??? Sicca (CMS-HCC)        MEDICATIONS:    Current Outpatient Medications:   ???  albuterol (PROVENTIL HFA;VENTOLIN HFA) 90 mcg/actuation inhaler, Inhale., Disp: , Rfl:   ???  ascorbic acid (VITAMIN C) 100 MG tablet, Take 500 mg by mouth daily. , Disp: , Rfl:   ???  aspirin (ECOTRIN) 81 MG tablet, Take 81 mg by mouth once daily., Disp: , Rfl:   ???  atorvastatin (LIPITOR) 80 MG tablet, Take 80 mg by mouth nightly., Disp: , Rfl:   ???  blood sugar diagnostic Strp, Test  blood sugar no more than twice daily., Disp: , Rfl:   ???  cyanocobalamin (VITAMIN B-12) 250 MCG tablet, Take 250 mcg by mouth daily., Disp: , Rfl:   ???  dextromethorphan-guaifenesin (MUCINEX DM) 30-600 mg Tb12, Take by mouth., Disp: , Rfl:   ???  ELIQUIS 5 mg Tab, Take 5 mg by mouth Two (2) times a day., Disp: , Rfl: 0  ???  ferrous sulfate dried (SLOW FE) 160 mg (50 mg iron) TbER, Take 50 mg by mouth once daily. Perfect iron, Disp: , Rfl:   ???  fluticasone-umeclidin-vilanter (TRELEGY ELLIPTA) 100-62.5-25 mcg inhaler, Inhale 1 puff., Disp: , Rfl:   ???  folic acid (FOLVITE) 400 MCG tablet, Take 2 tablets (800 mcg total) by mouth daily., Disp: 180 tablet, Rfl: 3  ???  furosemide (LASIX) 20 MG tablet, Take 60 mg by mouth Take as directed. 2 tabs in the am, 1 tab in the afternoon., Disp: , Rfl:   ???  gel base no.41, bulk, (HYDROGEL) Gel, 85 g by Miscellaneous route daily., Disp: 85 g, Rfl: 2  ???  loteprednol etabonate 0.5 % DrpG, Administer 1 drop to both eyes Medrol Dose Pack scheduling ONLY for 1 dose., Disp: 5 g, Rfl: 6  ???  MELATONIN ORAL, Take 9 mg by mouth nightly. , Disp: , Rfl:   ???  methotrexate 25 mg/mL injection solution, Inject 0.8 mL (20 mg total) under the skin once a week., Disp: 10 mL, Rfl: 6  ???  metoprolol tartrate (LOPRESSOR) 25 MG tablet, Take 50 mg by mouth Two (2) times a day. , Disp: , Rfl:   ???  mucus clearing device Devi, Blow through several times, 2 or 3 sets per day as needed, Disp: , Rfl:   ???  mupirocin (BACTROBAN) 2 % ointment, , Disp: , Rfl: 0  ???  NITROSTAT 0.4 mg SL tablet, Place 1 tablet under the tongue once as needed. , Disp: , Rfl:   ???  ONETOUCH ULTRA TEST Strp, , Disp: , Rfl: 12  ???  ONETOUCH ULTRASOFT lancets, , Disp: , Rfl: 0  ???  oxyCODONE-acetaminophen (PERCOCET) 7.5-325 mg per tablet, To be filled on 10/25/2018, Disp: 120 tablet, Rfl: 0  ???  pantoprazole (PROTONIX) 40 MG tablet, Take 40 mg by mouth Two (2) times a day. , Disp: , Rfl:   ???  potassium chloride SA (K-DUR,KLOR-CON) 10 MEQ tablet, TAKE 4 TS PO QAM AND TK 2 TS D AT 4PM, Disp: , Rfl: 9  ???  predniSONE (DELTASONE) 10 MG tablet, Take 1 tablet (10 mg total) by mouth daily., Disp: 30 tablet, Rfl: 5  ???  sodium chloride (OCEAN) 0.65 % nasal spray, 1 spray into each nostril., Disp: , Rfl:   ???  syringe with needle (BD TUBERCULIN SYRINGE) 1 mL 27 x 1/2 Syrg, 1 mL by Miscellaneous route once a week., Disp: 12 Syringe, Rfl: 6  ???  tbo-filgrastim (GRANIX) 480 mcg/0.8 mL Syrg injection, Inject under the skin every seven (7) days., Disp: , Rfl:   ???  teriparatide (FORTEO) 20 mcg/dose - 600 mcg/2.4 mL injection, Inject 0.08 mL (20 mcg total) under the skin daily., Disp: 2.4 mL, Rfl: 11  ???  VIT A/VIT C/VIT E/ZINC/COPPER (PRESERVISION AREDS ORAL), Take 1 tablet by mouth daily., Disp: , Rfl:   ???  predniSONE (DELTASONE) 1 MG tablet, Take 2 tablets (2 mg total) by mouth daily. (Patient not taking: Reported on 09/10/2018), Disp: 60 tablet, Rfl: 5  ???  predniSONE (DELTASONE) 5 MG tablet, Take 15  mg/d: one 10 mg tablet plus one 5 mg tablet daily. (Patient not taking: Reported on 09/10/2018), Disp: 90 tablet, Rfl: 6    PHYSICAL EXAM:  BP 131/72 (BP Site: L Arm, BP Position: Sitting, BP Cuff Size: Medium)  - Pulse 79  - Temp 35.6 ??C (Oral)  - Ht 182.9 cm (6' 0.01)  - Wt 98.2 kg (216 lb 8 oz)  - BMI 29.36 kg/m??   GENERAL: WN, WD in NAD.  DERMATOLOGICAL exam: no inflammatory, vasculitic rashes or nodules.  Ocular exam: sclera and conjunctiva anicteric and non-injected.   ZHY:QMVH mucosa moist without exudates or ulcers. Neck supple, without JVD, LAD or thyromegaly.  RESPIRATORY: lungs CTA.  CV: S1S2 RRR-M/G/R    GI: abd.: Soft, NT, ND, without OM, normal BS.  P.V.:  -c/c/e, normal pulses.  MUSCULOSKELETAL EXAM: advance rheumatoid deformities of hands and feet were again noted.  No STS or tenderness however, were present at the PIPs, MCPs, wrists, elbows, shoulders, ankles or feet. ROM of the neck, shoulders and hips is restricted in all planes but stable.     IMPRESSION:  68 year old male with long-standing RA with intermittent neutropenia and mild thrombocytopenia with recurrent infections.  He is s/p bone marrow aspiration that did not show definite evidence of a malignant process, LGL, Feltys or MDS. There is a question of an immune deficiency and the patient was referred to [facility] immunology/rheumatology (Dr. Latrelle Dodrill). Patient seen by Dr. 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 From an RA standpoint, he does not have any signs of active disease.    Of note, he was evaluated for possible overlap with SLE in June 2019. His ANA was positive with a titer of 1:160 speckled and homogenous and a cytoplasmic pattern. His C4 was low but his C3 was normal, ENAs and DS-DNA were negative.  CCP were greater than 340 and RF was greater than 1200. He had trace cryoglobulins (Nov 2018) and negative granulocyte antibodies. Parvovirus serologies were negative for acute disease.     PLAN:  - Continue MTX 20mg  subcutaneous weekly and prednisone 10mg  daily with regular monitoring of labs  - Plan for rituximab IV 1g x2 doses 2 weeks apart  - He will continue weekly monitoring of his CBC w/diff and use of GCSF administered by Dr. Deboraha Sprang      Start tapering prednisone by 2.5 mg every 4 weeks down to 12.5 mg/d until the next visit.      RTC in 12 weeks.

## 2018-09-10 NOTE — Unmapped (Addendum)
It was very nice seeing you today!    1. We recommend starting rituximab infusions: 1000mg  IV every 2 weeks for 2 doses    2. Continue methotrexate 20mg  injections weekly and prednisone 10mg  daily    3. Stop taking calcium supplement at this time    We will see you back in clinic on 4/16    [facility]  [address]  [place], [place] [address]  Phone: [phone]  Fax: [phone]    Patient Education        rituximab  Pronunciation:  ri TUX i mab  Brand:  Rituxan  What is the most important information I should know about rituximab?  Rituximab may cause a serious brain infection that can lead to disability or death. Call your doctor right away if you have problems with speech, thought, vision, or muscle movement. These symptoms may start gradually and get worse quickly.  Tell your doctor if you have ever had hepatitis B. Rituximab can cause this condition to come back or get worse.  Severe skin problems can also occur during treatment with rituximab. Call your doctor if you have painful skin or mouth sores, or a severe skin rash with blistering, peeling, or pus.  Some side effects may occur during the injection or within 24 hours afterward. Tell your caregiver right away  if you feel dizzy, weak, light-headed, short of breath, or if you have chest pain, wheezing, sudden cough, or pounding heartbeats or fluttering in your chest.  What is rituximab?  Rituximab is a cancer medicine that interferes with the growth and spread of cancer cells in the body.  Rituximab is used alone or in combination with other medicines to treat the following conditions in adults:  ?? non-Hodgkin's lymphoma or chronic lymphocytic leukemia;  ?? rheumatoid arthritis;  ?? certain rare disorders that cause inflammation of blood vessels and other tissues in the body; or  ?? a severe autoimmune reaction that causes blisters and breakdown of the skin and mucous membranes.  Rituximab may also be used for purposes not listed in this medication guide.  What should I discuss with my healthcare provider before receiving rituximab?  Rituximab may cause a serious brain infection that can lead to disability or death. This infection may be more likely if have used an immunosuppressant drug in the past, or if you have received rituximab with a stem cell transplant.  You should not be treated with rituximab if you are allergic to it.  Tell your doctor if you have ever had:  ?? liver disease or hepatitis (or if you are a carrier of hepatitis B);  ?? kidney disease;  ?? lung disease or a breathing disorder;  ?? a weak immune system (caused by disease or by using certain medicines);  ?? an active infection, including herpes, shingles, cytomegalovirus, chickenpox, parvovirus, West Nile virus, or hepatitis B or C;  ?? heart disease, angina (chest pain), or heart rhythm disorder; or  ?? if you have used rituximab in the past.  Do not use rituximab if you are pregnant. It could harm the unborn baby. Use effective birth control to prevent pregnancy while you are using this medicine and for at least 12 months after your last dose.  It is not safe to breast-feed a baby while you are using this medicine.  Also do not breast-feed for at least 6 months after your last dose.  How is rituximab given?  Your doctor will perform blood tests to  make sure you do not have conditions that would prevent you from safely using rituximab.  Rituximab is given as an infusion into a vein. A healthcare provider will give you this injection.  Rituximab is not given daily. Your schedule will depend on the condition being treated. Follow your doctor's dosing instructions very carefully.  Before each injection, you may be given other medications to prevent certain side effects of rituximab.  You will need frequent medical tests.  If you've ever had hepatitis B, using rituximab can cause this virus to become active or get worse. You may need frequent liver function tests while using this medicine and for several months after you stop.  If you need surgery, tell the surgeon ahead of time that you are using rituximab.  What happens if I miss a dose?  Call your doctor if you miss an appointment for your rituximab injection.  What happens if I overdose?  Since this medication is given by a healthcare professional in a medical setting, an overdose is unlikely to occur.  What should I avoid while receiving rituximab?  Do not receive a live vaccine while using rituximab, and avoid coming into contact with anyone who has recently received a live vaccine. There is a chance that the virus could be passed on to you. Live vaccines include measles, mumps, rubella (MMR), rotavirus, typhoid, yellow fever, varicella (chickenpox), zoster (shingles), and nasal flu (influenza) vaccine.  What are the possible side effects of rituximab?  Get emergency medical help if you have signs of an allergic reaction: hives; chest tightness, trouble breathing; swelling of your face, lips, tongue, or throat.  Some side effects may occur during the injection (or within 24 hours afterward). Tell your caregiver right away  if you feel itchy, dizzy, weak, light-headed, short of breath, or if you have chest pain, wheezing, sudden cough, or pounding heartbeats or fluttering in your chest.  Rituximab may cause a serious brain infection that can lead to disability or death. Call your doctor right away if you have any of the following symptoms (which may start gradually and get worse quickly):  ?? confusion, memory problems, or other changes in your mental state;  ?? weakness on one side of your body;  ?? vision changes; or  ?? problems with speech or walking.  Call your doctor at once if you have any of these other side effects, even if they occur several months after you receive rituximab, or after your treatment ends.  ?? fever, chills, cold or flu symptoms, cough, sore throat, headache, earache;  ?? pain or burning when you urinate;  ?? painful skin or mouth sores, or a severe skin rash with blistering, peeling, or pus;  ?? redness, warmth, or swelling of the skin;  ?? severe stomach pain, vomiting, constipation, bloody or tarry stools;  ?? chest pain, irregular heartbeats;  ?? dark urine, or jaundice (yellowing of the skin or eyes);  ?? low red blood cells (anemia) --pale skin, unusual tiredness, feeling light-headed or short of breath, cold hands and feet; or  ?? signs of tumor cell breakdown --confusion, weakness, muscle cramps, nausea, vomiting, fast or slow heart rate, decreased urination, tingling in your hands and feet or around your mouth.  Common side effects may include:  ?? fever, chills, body aches, anemia, infections;  ?? nausea, diarrhea;  ?? swelling in your hands or feet;  ?? feeling tired;  ?? joint pain, muscle spasms; or  ?? cold symptoms such as stuffy nose, sneezing, sore  throat.  This is not a complete list of side effects and others may occur. Call your doctor for medical advice about side effects. You may report side effects to FDA at 1-800-FDA-1088.  What other drugs will affect rituximab?  Tell your doctor about all your other medicines, especially:  ?? medicines to treat rheumatoid arthritis --adalimumab, certolizumab, etanercept, golimumab, infliximab, leflunomide, sulfasalazine.  This list is not complete. Other drugs may affect rituximab, including prescription and over-the-counter medicines, vitamins, and herbal products. Not all possible drug interactions are listed here.  Where can I get more information?  Your doctor or pharmacist can provide more information about rituximab.  Remember, keep this and all other medicines out of the reach of children, never share your medicines with others, and use this medication only for the indication prescribed.  Every effort has been made to ensure that the information provided by [employer] ('[employer]') is accurate, up-to-date, and complete, but no guarantee is made to that effect. Drug information contained herein may be time sensitive. [employer] information has been compiled for use by healthcare practitioners and consumers in the [place] and therefore [employer] does not warrant that uses outside of the [place] are appropriate, unless specifically indicated otherwise. Mult[employer]'s drug information does not endorse drugs, diagnose patients or recommend therapy. Mult[employer]'s drug information is an Investment banker, corporate to assist licensed healthcare practitioners in caring for their patients and/or to serve consumers viewing this service as a supplement to, and not a substitute for, the expertise, skill, knowledge and judgment of healthcare practitioners. The absence of a warning for a given drug or drug combination in no way should be construed to indicate that the drug or drug combination is safe, effective or appropriate for any given patient. Mult[employer] does not assume any responsibility for any aspect of healthcare administered with the aid of information Mult[employer] provides. The information contained herein is not intended to cover all possible uses, directions, precautions, warnings, drug interactions, allergic reactions, or adverse effects. If you have questions about the drugs you are taking, check with your doctor, nurse or pharmacist.  Copyright [id number] [employer] Version: 12.01. Revision date: 04/29/2017.  Care instructions adapted under license by [facility]. If you have questions about a medical condition or this instruction, always ask your healthcare professional. [employer] disclaims any warranty or liability for your use of this information.

## 2018-09-10 NOTE — Unmapped (Signed)
[facility] RHEUMATOLOGY CLINIC - PHARMACIST NOTES    Dr. Olean Ree would like to start Rituxan infusion for severe rheumatoid arthritis.  Patient would like to get infusion locally at Dr. Maricela Bo (hematologist) office.  Reached out to Dr. Deboraha Sprang office at [phone].  Office staff indicate it's possible to get Rituxan infusion through [facility]. Will need to fax over orders and office notes to clinic and they will take care of getting infusion approved, scheduling, etc.     Office: [facility]   MD:  Dr. Belenda Cruise  Address:  [address]  [place], [place] [address]  Fax #:  [phone]    Awaiting today's progress notes to be completed then I will fax that along with therapy plan orders to facility as requested.     Chelsea Aus

## 2018-09-15 MED ORDER — OXYCODONE-ACETAMINOPHEN 7.5 MG-325 MG TABLET: tablet | 0 refills | 0 days | Status: AC

## 2018-09-15 MED ORDER — OXYCODONE-ACETAMINOPHEN 7.5 MG-325 MG TABLET
ORAL_TABLET | Freq: Four times a day (QID) | ORAL | 0 refills | 0.00000 days | Status: CP | PRN
Start: 2018-09-15 — End: 2018-09-28

## 2018-09-18 NOTE — Unmapped (Signed)
[facility] RHEUMATOLOGY CLINIC - PHARMACIST NOTES    Progress notes, Rituxan therapy plan and patient's demographics faxed over to [facility] with Dr. Melanie Crazier (fax # [phone]).  Patient informed of referral for infusion there and he appreciates assistance.  He may need financial assistance if cost of infusion is high there. Advised patient to contact our clinic if he need assistance after referral is approved.      Chelsea Aus

## 2018-09-28 MED ORDER — OXYCODONE-ACETAMINOPHEN 7.5 MG-325 MG TABLET
ORAL_TABLET | Freq: Four times a day (QID) | ORAL | 0 refills | 0.00000 days | Status: CP | PRN
Start: 2018-09-28 — End: 2018-09-28

## 2018-09-30 NOTE — Unmapped (Addendum)
[facility] RHEUMATOLOGY CLINIC - PHARMACIST NOTES    Received call from Debbie, Artist at [facility].  She states Dr. Melanie Crazier received the Rituxan referral but the referring office needs to complete PA and get medication approved and his office will not be involved with this process since patient does not have an oncology indication.  This was not what was explained to me during the initial referral process but I'm happy to obtain approval from insurance and fax over to [facility].     Reached out to patient's insurance, [employer] @ [phone] and was transferred to [phone] to initiated benefit investigation for Rituxan [phone]).  PA initiated and approved with the following information.      Reference #: [id number]  Approval today until 04/05/2019    Infusion Facility:  [facility]   Tax ID:  [id number]  NPI:  [id number]  Physician:  Dellis Anes   Address: [address]  [place], [place] [address]    Awaiting to receive approval letter via fax then will forward to [facility].      Chelsea Aus

## 2018-09-30 NOTE — Unmapped (Signed)
[facility] RHEUMATOLOGY CLINIC - PHARMACIST NOTES    Reached out to [facility] @ [phone] to follow up on the status of referral for Rituxan infusion.  Spoke with Cordelia Pen, who states that referral has been sent to Dr. Melanie Crazier.  Financial counselor, Eunice Blase, states she did not hear from Dr. Melanie Crazier yet but will follow up and call me back with status.     Chelsea Aus

## 2018-10-06 NOTE — Unmapped (Signed)
[facility] RHEUMATOLOGY CLINIC - PHARMACIST NOTES    Received Rituxan approval letter from [facility] - however, approval was for infusion at [facility] despite request for infusion at [facility].      Reached out to [facility] again at [phone] to clarify.  New reference # [id number] and Rituxan should now be approved for infusion with Dr. Maricela Bo office.      Approval fax received and forwarded to [facility] @ Dr. Teodoro Kil office (fax # [phone]).  Per Eunice Blase, patient is schedule for Rituxan infusion this coming Monday (2/17).      Chelsea Aus

## 2018-10-27 MED ORDER — OXYCODONE-ACETAMINOPHEN 7.5 MG-325 MG TABLET
ORAL_TABLET | Freq: Four times a day (QID) | ORAL | 0 refills | 0.00000 days | Status: CP | PRN
Start: 2018-10-27 — End: 2018-11-05

## 2018-11-04 DIAGNOSIS — M05742 Rheumatoid arthritis with rheumatoid factor of left hand without organ or systems involvement: Principal | ICD-10-CM

## 2018-11-04 DIAGNOSIS — M05741 Rheumatoid arthritis with rheumatoid factor of right hand without organ or systems involvement: Principal | ICD-10-CM

## 2018-11-04 DIAGNOSIS — M059 Rheumatoid arthritis with rheumatoid factor, unspecified: Principal | ICD-10-CM

## 2018-11-04 DIAGNOSIS — S22080D Wedge compression fracture of T11-T12 vertebra, subsequent encounter for fracture with routine healing: Principal | ICD-10-CM

## 2018-11-04 NOTE — Unmapped (Signed)
Reason for call: Pt called in requesting for a refill on his PERCOCET for two month? Pt is stating that Dr. Elvera Lennox needs to send in the directions on how pt needs to take medication with prescription.     Thanks     Last ov: 09/10/2018  Next ov: 12/10/2018

## 2018-11-05 MED ORDER — OXYCODONE-ACETAMINOPHEN 7.5 MG-325 MG TABLET
ORAL_TABLET | Freq: Four times a day (QID) | ORAL | 0 refills | 0 days | Status: CP | PRN
Start: 2018-11-05 — End: 2018-12-10

## 2018-11-05 NOTE — Unmapped (Signed)
Addended by: Perlie Gold on: 11/05/2018 01:00 PM     Modules accepted: Orders

## 2018-11-19 ENCOUNTER — Other Ambulatory Visit: Payer: Self-pay | Admitting: Cardiology

## 2018-12-10 ENCOUNTER — Institutional Professional Consult (permissible substitution): Admit: 2018-12-10 | Discharge: 2018-12-11 | Payer: MEDICARE | Attending: Rheumatology | Primary: Rheumatology

## 2018-12-10 DIAGNOSIS — M05742 Rheumatoid arthritis with rheumatoid factor of left hand without organ or systems involvement: Secondary | ICD-10-CM

## 2018-12-10 DIAGNOSIS — M059 Rheumatoid arthritis with rheumatoid factor, unspecified: Secondary | ICD-10-CM

## 2018-12-10 DIAGNOSIS — L409 Psoriasis, unspecified: Secondary | ICD-10-CM

## 2018-12-10 DIAGNOSIS — M8000XD Age-related osteoporosis with current pathological fracture, unspecified site, subsequent encounter for fracture with routine healing: Secondary | ICD-10-CM

## 2018-12-10 DIAGNOSIS — S22080D Wedge compression fracture of T11-T12 vertebra, subsequent encounter for fracture with routine healing: Secondary | ICD-10-CM

## 2018-12-10 DIAGNOSIS — M05741 Rheumatoid arthritis with rheumatoid factor of right hand without organ or systems involvement: Secondary | ICD-10-CM

## 2018-12-10 DIAGNOSIS — M069 Rheumatoid arthritis, unspecified: Secondary | ICD-10-CM

## 2018-12-10 DIAGNOSIS — J438 Other emphysema: Principal | ICD-10-CM

## 2018-12-10 MED ORDER — OXYCODONE-ACETAMINOPHEN 7.5 MG-325 MG TABLET
ORAL_TABLET | Freq: Four times a day (QID) | ORAL | 0 refills | 0 days | Status: CP | PRN
Start: 2018-12-10 — End: 2019-01-21

## 2018-12-10 MED ORDER — METHOTREXATE SODIUM 25 MG/ML INJECTION SOLUTION
SUBCUTANEOUS | 6 refills | 0 days | Status: CP
Start: 2018-12-10 — End: 2019-03-11

## 2018-12-10 NOTE — Unmapped (Addendum)
I spent 25 minutes on the phone with the patient. I spent an additional 10 minutes on pre- and post-visit activities.     The patient was physically located in [place] or a state in which I am permitted to provide care. The patient understood that s/he may incur co-pays and cost sharing, and agreed to the telemedicine visit. The visit was completed via phone and/or video, which was appropriate and reasonable under the circumstances given the patient's presentation at the time.    The patient has been advised of the potential risks and limitations of this mode of treatment (including, but not limited to, the absence of in-person examination) and has agreed to be treated using telemedicine. The patient's/patient's family's questions regarding telemedicine have been answered.     If the phone/video visit was completed in an ambulatory setting, the patient has also been advised to contact their provider???s office for worsening conditions, and seek emergency medical treatment and/or call 911 if the patient deems either necessary.          ASSESSMENT and PLAN:    1-RA, stable.  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   3-OP and compression fractures, on Forteo.    Continue for now prednisone 10 mg/d and sq MTX 20 mg/week.    We will look into an assistance program to help pay for rituxan and if approved, will give 2 doses of 1000 mg.    Continue vitamin D supplementation and Forteo for now for OP.        RTC in 3-4 months.         Chief Complaint   Patient presents with   ??? telephone visit     Pt c/o's of mild pain in his hands.       HPI:    The patient is a 68 y.o. 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 At the previous visit and, after consulting Dr. Cala Bradford, we had planned to treat him with rituximab x2. Unfortunately, although the medicine was approved by his insurance, he was unable to afford it. Our pharmacy staff is working on obtaining a discount that has  Not gone through as of yet. He is therefore still taking prednisone 10 mg/d and sq MTX 20 mg/week. Fortunately, his white cell counts have been much improved lately and he has not needed Granix therapy in several weeks.   In terms of his Osteoporosis with several compression fractures, he is now on Forteo ( so far for 4 months) and the last x-ray done by his local orthopedist did not show new fractures and showed some improvement fo the bone density per his report.     Today, he denied significant joint pain, swelling or AM stiffness in any joint. He also denied cough, dyspnea, chest pain, fevers, or chills. He has been staying a home for several months and does not go out except for Dr. Visits. He feels the best he's felt in a while.     See care everywhere for most recent lab results. 11/17/18 labs: WBC=11.2, Hg=13.7, HCT=41%, PLT=206,000, ANC=9.02.     ROS:  all systems were reviewed and negative except as noted in the HPI.    Immunization History   Administered Date(s) Administered   ??? INFLUENZA TIV (TRI) PF (IM) 06/28/2011   ??? Influenza Virus Vaccine, unspecified formulation 05/16/2015, 04/26/2017   ??? PNEUMOCOCCAL POLYSACCHARIDE 23 04/09/2011   ??? Pneumococcal Conjugate 13-Valent 09/12/2013   ???  Pneumococcal Conjugate, Unspecified Formulation 04/26/2017        Past Medical History:   Diagnosis Date   ??? CAD (coronary artery disease)    ??? CHF (congestive heart failure) (CMS-HCC)    ??? COPD (chronic obstructive pulmonary disease) (CMS-HCC)    ??? Diabetes mellitus (CMS-HCC) 2008    Type 2   ??? DJD (degenerative joint disease)    ??? GERD (gastroesophageal reflux disease)    ??? Hyperlipidemia    ??? Hypertension    ??? MI (myocardial infarction) (CMS-HCC) 1999, 2077   ??? Osteomyelitis of foot (CMS-HCC)    ??? Osteoporosis 2012   ??? Peripheral neuropathy    ??? Pneumonia, aspiration (CMS-HCC)    ??? Psoriasis    ??? RA (rheumatoid arthritis) (CMS-HCC)    ??? Sicca (CMS-HCC)          Current Outpatient Medications:   ???  albuterol (PROVENTIL HFA;VENTOLIN HFA) 90 mcg/actuation inhaler, Inhale., Disp: , Rfl:   ???  ascorbic acid (VITAMIN C) 100 MG tablet, Take 500 mg by mouth daily. , Disp: , Rfl:   ???  aspirin (ECOTRIN) 81 MG tablet, Take 81 mg by mouth once daily., Disp: , Rfl:   ???  atorvastatin (LIPITOR) 80 MG tablet, Take 80 mg by mouth nightly., Disp: , Rfl:   ???  blood sugar diagnostic Strp, Test blood sugar no more than twice daily., Disp: , Rfl:   ???  cyanocobalamin (VITAMIN B-12) 250 MCG tablet, Take 250 mcg by mouth daily., Disp: , Rfl:   ???  dextromethorphan-guaifenesin (MUCINEX DM) 30-600 mg Tb12, Take by mouth., Disp: , Rfl:   ???  ELIQUIS 5 mg Tab, Take 5 mg by mouth Two (2) times a day., Disp: , Rfl: 0  ???  ferrous sulfate dried (SLOW FE) 160 mg (50 mg iron) TbER, Take 50 mg by mouth once daily. Perfect iron, Disp: , Rfl:   ???  fluticasone-umeclidin-vilanter (TRELEGY ELLIPTA) 100-62.5-25 mcg inhaler, Inhale 1 puff., Disp: , Rfl:   ???  folic acid (FOLVITE) 400 MCG tablet, Take 2 tablets (800 mcg total) by mouth daily., Disp: 180 tablet, Rfl: 3  ???  furosemide (LASIX) 20 MG tablet, Take 60 mg by mouth Take as directed. 2 tabs in the am, 1 tab in the afternoon., Disp: , Rfl:   ???  gel base no.41, bulk, (HYDROGEL) Gel, 85 g by Miscellaneous route daily., Disp: 85 g, Rfl: 2  ???  loteprednol (LOTEMAX) 0.2 % DrpS, 1 drop Four (4) times a day., Disp: , Rfl:   ???  MELATONIN ORAL, Take 9 mg by mouth nightly. , Disp: , Rfl:   ???  methotrexate 25 mg/mL injection solution, Inject 0.8 mL (20 mg total) under the skin once a week., Disp: 10 mL, Rfl: 6  ???  metoprolol tartrate (LOPRESSOR) 25 MG tablet, Take 50 mg by mouth Two (2) times a day. , Disp: , Rfl:   ???  mucus clearing device Devi, Blow through several times, 2 or 3 sets per day as needed, Disp: , Rfl:   ???  mupirocin (BACTROBAN) 2 % ointment, , Disp: , Rfl: 0  ???  NITROSTAT 0.4 mg SL tablet, Place 1 tablet under the tongue once as needed. , Disp: , Rfl:   ???  ONETOUCH ULTRA TEST Strp, , Disp: , Rfl: 12  ???  ONETOUCH ULTRASOFT lancets, , Disp: , Rfl: 0  ???  oxyCODONE-acetaminophen (PERCOCET) 7.5-325 mg per tablet, Take 1 tablet by mouth every six (6) hours as needed for  pain. To be filled on 10/27/2018, Disp: 120 tablet, Rfl: 0  ???  pantoprazole (PROTONIX) 40 MG tablet, Take 40 mg by mouth Two (2) times a day. , Disp: , Rfl:   ???  potassium chloride SA (K-DUR,KLOR-CON) 10 MEQ tablet, TAKE 4 TS PO QAM AND TK 2 TS D AT 4PM, Disp: , Rfl: 9  ???  predniSONE (DELTASONE) 10 MG tablet, Take 1 tablet (10 mg total) by mouth daily., Disp: 30 tablet, Rfl: 5  ???  sodium chloride (OCEAN) 0.65 % nasal spray, 1 spray into each nostril., Disp: , Rfl:   ???  syringe with needle (BD TUBERCULIN SYRINGE) 1 mL 27 x 1/2 Syrg, 1 mL by Miscellaneous route once a week., Disp: 12 Syringe, Rfl: 6  ???  tbo-filgrastim (GRANIX) 480 mcg/0.8 mL Syrg injection, Inject under the skin every seven (7) days., Disp: , Rfl:   ???  teriparatide (FORTEO) 20 mcg/dose - 600 mcg/2.4 mL injection, Inject 0.08 mL (20 mcg total) under the skin daily., Disp: 2.4 mL, Rfl: 11  ???  VIT A/VIT C/VIT E/ZINC/COPPER (PRESERVISION AREDS ORAL), Take 1 tablet by mouth daily., Disp: , Rfl:   ???  predniSONE (DELTASONE) 1 MG tablet, Take 2 tablets (2 mg total) by mouth daily. (Patient not taking: Reported on 09/10/2018), Disp: 60 tablet, Rfl: 5  ???  predniSONE (DELTASONE) 5 MG tablet, Take 15 mg/d: one 10 mg tablet plus one 5 mg tablet daily. (Patient not taking: Reported on 09/10/2018), Disp: 90 tablet, Rfl: 6

## 2018-12-14 ENCOUNTER — Telehealth: Payer: Self-pay | Admitting: Cardiology

## 2018-12-14 NOTE — Telephone Encounter (Signed)
Lmtcb; 1 yr recall; virtual visit now or ov in august./dc

## 2019-01-11 NOTE — Unmapped (Signed)
Reason for call: PT called in wanting to know updates regarding his medication of rituximab? He stated that we were going to help pt to get medication at a lower cost.     Thanks       Last ov: 09/10/2018  Next ov: Visit date not found

## 2019-01-12 ENCOUNTER — Other Ambulatory Visit: Payer: Self-pay | Admitting: Cardiology

## 2019-01-12 NOTE — Unmapped (Signed)
Spoke with pt. He would like to get his infusion with Dr. Markus Jarvis local hematologist). His insurance has approved this(see Thuy's note 10/07/2018), but pt states Chelsea Aus was going to see if he could receive manufacturer's assistance to help with cost of medication. Pt states he has not received information regarding assistance and has not had an infusion yet. Can you help with this or tell me who I should reach out to? Thank you!!

## 2019-01-13 NOTE — Unmapped (Signed)
Reached out to [place], LPN regarding helping pt apply for manufacturer's assistance. Tiffany contacted pt and provided information of what pt needs to submit to [place]. She also contacted Dr. Elvera Lennox to let him know what form he needs to complete.

## 2019-01-21 MED ORDER — OXYCODONE-ACETAMINOPHEN 7.5 MG-325 MG TABLET
ORAL_TABLET | Freq: Four times a day (QID) | ORAL | 0 refills | 0 days | Status: CP | PRN
Start: 2019-01-21 — End: 2019-02-26

## 2019-01-21 NOTE — Unmapped (Signed)
Assuming patient needs a RF of Percocet. Last refill was for a month supply.     Narcotic (Percocet) refill  Last ov: 09/10/2018  Next ov: Visit date not found

## 2019-01-21 NOTE — Unmapped (Signed)
Hi,    If patient calls back. I have no updated information to give him until Dr. Elvera Lennox completes his portion of the medication assistance form and the form is sent in.    Patrick Hunter

## 2019-01-21 NOTE — Unmapped (Signed)
Addended by: Mellody Life on: 01/21/2019 04:08 PM     Modules accepted: Orders

## 2019-01-21 NOTE — Unmapped (Signed)
Reason for call:   2 mo refill of oxycodone, pharmacy requires the dosing   Would like to get them refilled by this weekend    Immune system WBC 8.6 and absolute neutrophil count is 6.2     Tiffany was going to find out about the rituxan-discount on the price from the manufacturer-please call patient with update today    Last ov: 09/10/2018  Next ov: Visit date not found

## 2019-02-09 NOTE — Unmapped (Addendum)
[facility] RHEUMATOLOGY CLINIC - PHARMACIST NOTES    Received call from patient's wife following up on Rituxan mfg assistance status that patient believed I was working on.      My last intervention was documented on 10/06/18, when patient was approved to receive Rituxan at [facility] Hematology with Dr. Dellis Anes and his scheduler confirmed infusion appt is already scheduled on 10/12/18.  I was not aware of the need for mfg assistance as patient has active medical coverage and cost was to be discussed with patient by [facility].        Will f/u with clinic staff and [facility] for more updates.     Chelsea Aus

## 2019-02-09 NOTE — Unmapped (Signed)
PT is calling reagrding his rituxan? he is stating that he still hasnt heard anything reagrding [employer] on this medication and PT at this Point doesnt know what to do. Pt is requesting to speak with someone reagrding this.

## 2019-02-10 NOTE — Unmapped (Signed)
[facility] RHEUMATOLOGY CLINIC - PHARMACIST NOTES    Contacted [employer] @ [phone] to see if they have any information regarding patient's Rituxan mfg assistance application.  Patient is not in their system, meaning application has not been submitted.     Spoke with Dr. Olean Ree, patient and patient's wife and the office of Dr. Dellis Anes.  Patient and wife states they were not informed of how to complete their portion of the application.  Provider's portion of application faxed into [place] and patient's consent completed electronically via [employer] based on signature waiver on file from last year.  Patient did not file tax in 2019 so submitted 2018 tax returns from MAP team.  Per Dr. Teodoro Kil office, Vira Agar is the contact at the clinic once med is approved and ready to be shipped.      Med Shipment Address:   [address], [address].  [place], [place] [address]  Contact:  Vira Agar.  phone [phone], fax [phone]    Will f/u with Mendel Ryder in a few days to ensure application is received.     Chelsea Aus

## 2019-02-10 NOTE — Unmapped (Signed)
Patient spoke with [place] and Dr. Elvera Lennox.    Dr. Elvera Lennox is aware of his portion that needs to be completed and will have it submitted to [facility].

## 2019-02-12 NOTE — Unmapped (Signed)
[facility] RHEUMATOLOGY CLINIC - PHARMACIST NOTES    Reached out to the [facility] Patient Assistance program to ensure Rituxan mfg assistance application has been received.  Mendel Ryder confirmed that they did receive both the patient and physician's portion of the the application.  Application will be processed within the next few days.     Chelsea Aus

## 2019-02-17 NOTE — Unmapped (Signed)
[facility] RHEUMATOLOGY CLINIC - PHARMACIST NOTES  ??  Reached out to the [facility] Patient Assistance to f/u status of Rituxan mfg assistance application.  Application is still in process - expect turnaround time is another 3-5 business days.     Chelsea Aus

## 2019-02-24 NOTE — Unmapped (Signed)
[facility] RHEUMATOLOGY CLINIC - PHARMACIST NOTES    Received fax from [place] informing that Patrick Hunter is now approved for Rituxan mfg assistance until therapy is discontinued or patient's financial status change.  Approval letter will be scanned into Epic.     Informed Patrick Hunter and wife via phone.  Approval letter emailed to patient per request.  Requested patient to call our clinic when first infusion is set up so we're aware.     Spoke with [facility] Hematology [phone], ex [phone]) and will fax the approval letter to her at [phone].  Patrick Hunter is aware of the situation and will receive Rituxan shipment from [place].      Spoke with Patrick Hunter @ [facility] ([employer] contracted pharmacy [phone]) and provided them with Patrick Hunter contact info.  They will call Patrick Hunter to set up any future Rituxan shipment.     Dr. Olean Ree informed.    Patrick Hunter

## 2019-02-25 NOTE — Unmapped (Signed)
Percocet and Prednisone refills  Last ov: 11/2018  Next ov: Visit date not found    Per visit note, pt to continue Prednisone 10 mg daily for now

## 2019-02-25 NOTE — Unmapped (Signed)
Patient called requesting his medication listed refilled -     Oxycodone  percocet   Prednisone 5mg  & 10MG      [facility]     CB# [phone]

## 2019-02-26 MED ORDER — OXYCODONE-ACETAMINOPHEN 7.5 MG-325 MG TABLET
ORAL_TABLET | Freq: Four times a day (QID) | ORAL | 0 refills | 0.00000 days | Status: CP | PRN
Start: 2019-02-26 — End: 2019-03-11

## 2019-02-26 MED ORDER — PREDNISONE 10 MG TABLET
ORAL_TABLET | Freq: Every day | ORAL | 5 refills | 0 days | Status: CP
Start: 2019-02-26 — End: 2019-03-11

## 2019-03-05 MED ORDER — ALPRAZOLAM 0.5 MG TABLET
ORAL_TABLET | Freq: Three times a day (TID) | ORAL | 0 refills | 0 days | Status: CP | PRN
Start: 2019-03-05 — End: 2019-04-06

## 2019-03-05 NOTE — Unmapped (Signed)
Reason for call:     Patient has been having SOB for about 2 week and recently developed swelling and fluid in his lungs. He saw his cardiologist and he is scheduled for a heart scan in [place] on Monday morning to check if he has any heart failure.     He cannot sleep due to to the SOB and is under so much stress due to not sleeping and the symptoms. He said that you know him better than anyone and is asking if you would send something in for his anxiety to help him through the weekend.     He uses [facility] (in [place])    Last ov: 09/10/2018  Next ov: Visit date not found

## 2019-03-11 ENCOUNTER — Ambulatory Visit: Admit: 2019-03-11 | Discharge: 2019-03-12 | Payer: MEDICARE | Attending: Rheumatology | Primary: Rheumatology

## 2019-03-11 DIAGNOSIS — M059 Rheumatoid arthritis with rheumatoid factor, unspecified: Principal | ICD-10-CM

## 2019-03-11 DIAGNOSIS — D708 Other neutropenia: Secondary | ICD-10-CM

## 2019-03-11 DIAGNOSIS — M05741 Rheumatoid arthritis with rheumatoid factor of right hand without organ or systems involvement: Secondary | ICD-10-CM

## 2019-03-11 DIAGNOSIS — M069 Rheumatoid arthritis, unspecified: Secondary | ICD-10-CM

## 2019-03-11 DIAGNOSIS — M05742 Rheumatoid arthritis with rheumatoid factor of left hand without organ or systems involvement: Secondary | ICD-10-CM

## 2019-03-11 DIAGNOSIS — S22080D Wedge compression fracture of T11-T12 vertebra, subsequent encounter for fracture with routine healing: Secondary | ICD-10-CM

## 2019-03-11 DIAGNOSIS — M8000XG Age-related osteoporosis with current pathological fracture, unspecified site, subsequent encounter for fracture with delayed healing: Secondary | ICD-10-CM

## 2019-03-11 MED ORDER — PREDNISONE 10 MG TABLET
ORAL_TABLET | Freq: Every day | ORAL | 5 refills | 30.00000 days | Status: CP
Start: 2019-03-11 — End: ?

## 2019-03-11 MED ORDER — OXYCODONE-ACETAMINOPHEN 7.5 MG-325 MG TABLET
ORAL_TABLET | Freq: Four times a day (QID) | ORAL | 0 refills | 30 days | Status: CP | PRN
Start: 2019-03-11 — End: 2019-03-18

## 2019-03-11 MED ORDER — METHOTREXATE SODIUM 25 MG/ML INJECTION SOLUTION
SUBCUTANEOUS | 6 refills | 84.00000 days | Status: CP
Start: 2019-03-11 — End: ?

## 2019-03-11 NOTE — Unmapped (Signed)
meds refilled under separate encounter by 02/26/2019.

## 2019-03-11 NOTE — Unmapped (Signed)
I spent 22 minutes on telephone with the patient. I spent an additional 15 minutes on pre- and post-visit activities.     The patient was physically located in [place] or a state in which I am permitted to provide care. The patient understood that s/he may incur co-pays and cost sharing, and agreed to the telemedicine visit. The visit was completed via phone and/or video, which was appropriate and reasonable under the circumstances given the patient's presentation at the time.    The patient has been advised of the potential risks and limitations of this mode of treatment (including, but not limited to, the absence of in-person examination) and has agreed to be treated using telemedicine. The patient's/patient's family's questions regarding telemedicine have been answered.     If the phone/video visit was completed in an ambulatory setting, the patient has also been advised to contact their provider???s office for worsening conditions, and seek emergency medical treatment and/or call 911 if the patient deems either necessary.          ASSESSMENT and PLAN:    1-RA, stable but, steroid dependent.   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   3-OP and compression fractures, on Forteo.  4-Recent episode to worsening dyspnea and leg edema responsive to diuresis. Etiology is uncertian as the preliminary echocardiogram did not show low EF. ? PHTN. He is followed by cardiology and pulmonary.     Continue for now prednisone 10 mg/d and sq MTX 20 mg/week.    Start Rituxan 1000 mg x 2 every 6 months.     Continue vitamin D supplementation and Forteo for now for OP.        RTC in 3 months.         Cc: improved leg swelling and Shortness of breath.     HPI:    The patient is a 68 y.o. 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 We consulted Dr. Cala Bradford, we had planned to treat him with rituximab x2.   He is therefore still taking prednisone 10 mg/d and sq MTX 20 mg/week. Fortunately, his white cell counts have been much improved lately and he has not needed Granix therapy in several weeks.     In terms of his Osteoporosis with several compression fractures, he is now on Forteo ( so far for 7 months) and the last x-ray done by his local orthopedist did not show new fractures and showed some improvement fo the bone density per his report.     In the interim, he experienced an episode of acute dyspnea and leg edema about 10-14 days ago. He was supected to have CHF and received diuresis which was very effective.  He has a history of Paroxysmal Atrial Fibrillation. He saw his cardiologist on 03/08/19 and a Preliminary echocardiogram report showed below did not reveal HF.  His most recent CBC showed an ANC of 10.6, WBC=12.4 and Hg= 14. He has not needed Granix in  12 weeks.  His first rituxan dose was deferred because of the CHF episode. His pulmonologist reportedly did not think the cause of his dyspnea was his COPD per the patient's report.   In terms of his RA, this is stable and has not had joint swelling and only experiences 30 minutes of AM stiffness. He is eager to start rituxan. His next appointment with cardiology is on 03/18/19.     ROS:  all systems were reviewed and  negative except as noted in the HPI.    Echocardiogram 03/08/19 :  Preserved biventricular size and systolic function, LVEF >55%  Trace TR, Trace MR  Aortic valve sclerosis without significant stenosis  Normal RVSP 23 mm Hg  Normal diastolic function for age    Immunization History   Administered Date(s) Administered   ??? INFLUENZA TIV (TRI) PF (IM) 06/28/2011   ??? Influenza Virus Vaccine, unspecified formulation 05/16/2015, 04/26/2017   ??? PNEUMOCOCCAL POLYSACCHARIDE 23 04/09/2011   ??? Pneumococcal Conjugate 13-Valent 09/12/2013   ??? Pneumococcal Conjugate, Unspecified Formulation 04/26/2017        Past Medical History:   Diagnosis Date   ??? CAD (coronary artery disease)    ??? CHF (congestive heart failure) (CMS-HCC)    ??? COPD (chronic obstructive pulmonary disease) (CMS-HCC)    ??? Diabetes mellitus (CMS-HCC) 2008    Type 2   ??? DJD (degenerative joint disease)    ??? GERD (gastroesophageal reflux disease)    ??? Hyperlipidemia    ??? Hypertension    ??? MI (myocardial infarction) (CMS-HCC) 1999, 2077   ??? Osteomyelitis of foot (CMS-HCC)    ??? Osteoporosis 2012   ??? Peripheral neuropathy    ??? Pneumonia, aspiration (CMS-HCC)    ??? Psoriasis    ??? RA (rheumatoid arthritis) (CMS-HCC)    ??? Sicca (CMS-HCC)          Current Outpatient Medications:   ???  albuterol (PROVENTIL HFA;VENTOLIN HFA) 90 mcg/actuation inhaler, Inhale., Disp: , Rfl:   ???  ALPRAZolam (XANAX) 0.5 MG tablet, Take 1 tablet (0.5 mg total) by mouth Three (3) times a day as needed for sleep., Disp: 30 tablet, Rfl: 0  ???  ascorbic acid (VITAMIN C) 100 MG tablet, Take 500 mg by mouth daily. , Disp: , Rfl:   ???  aspirin (ECOTRIN) 81 MG tablet, Take 81 mg by mouth once daily., Disp: , Rfl:   ???  atorvastatin (LIPITOR) 80 MG tablet, Take 80 mg by mouth nightly., Disp: , Rfl:   ???  blood sugar diagnostic Strp, Test blood sugar no more than twice daily., Disp: , Rfl:   ???  cyanocobalamin (VITAMIN B-12) 250 MCG tablet, Take 250 mcg by mouth daily., Disp: , Rfl:   ???  dextromethorphan-guaifenesin (MUCINEX DM) 30-600 mg Tb12, Take by mouth., Disp: , Rfl:   ???  ELIQUIS 5 mg Tab, Take 5 mg by mouth Two (2) times a day., Disp: , Rfl: 0  ???  ferrous sulfate dried (SLOW FE) 160 mg (50 mg iron) TbER, Take 50 mg by mouth once daily. Perfect iron, Disp: , Rfl:   ???  fluticasone-umeclidin-vilanter (TRELEGY ELLIPTA) 100-62.5-25 mcg inhaler, Inhale 1 puff., Disp: , Rfl:   ???  folic acid (FOLVITE) 400 MCG tablet, Take 2 tablets (800 mcg total) by mouth daily., Disp: 180 tablet, Rfl: 3  ???  furosemide (LASIX) 20 MG tablet, Take 60 mg by mouth Take as directed. 2 tabs in the am, 1 tab in the afternoon., Disp: , Rfl:   ???  gel base no.41, bulk, (HYDROGEL) Gel, 85 g by Miscellaneous route daily., Disp: 85 g, Rfl: 2  ???  loteprednol (LOTEMAX) 0.2 % DrpS, 1 drop Four (4) times a day., Disp: , Rfl:   ???  MELATONIN ORAL, Take 9 mg by mouth nightly. , Disp: , Rfl:   ???  methotrexate 25 mg/mL injection solution, Inject 0.8 mL (20 mg total) under the skin once a week., Disp: 10 mL, Rfl: 6  ???  metoprolol  tartrate (LOPRESSOR) 25 MG tablet, Take 50 mg by mouth Two (2) times a day. , Disp: , Rfl:   ???  mucus clearing device Devi, Blow through several times, 2 or 3 sets per day as needed, Disp: , Rfl:   ???  mupirocin (BACTROBAN) 2 % ointment, , Disp: , Rfl: 0  ???  NITROSTAT 0.4 mg SL tablet, Place 1 tablet under the tongue once as needed. , Disp: , Rfl:   ???  ONETOUCH ULTRA TEST Strp, , Disp: , Rfl: 12  ???  ONETOUCH ULTRASOFT lancets, , Disp: , Rfl: 0  ???  oxyCODONE-acetaminophen (PERCOCET) 7.5-325 mg per tablet, Take 1 tablet by mouth every six (6) hours as needed for pain., Disp: 120 tablet, Rfl: 0  ???  pantoprazole (PROTONIX) 40 MG tablet, Take 40 mg by mouth Two (2) times a day. , Disp: , Rfl:   ???  potassium chloride SA (K-DUR,KLOR-CON) 10 MEQ tablet, TAKE 4 TS PO QAM AND TK 2 TS D AT 4PM, Disp: , Rfl: 9  ???  predniSONE (DELTASONE) 10 MG tablet, Take 1 tablet (10 mg total) by mouth daily., Disp: 30 tablet, Rfl: 5  ???  sodium chloride (OCEAN) 0.65 % nasal spray, 1 spray into each nostril., Disp: , Rfl:   ???  syringe with needle (BD TUBERCULIN SYRINGE) 1 mL 27 x 1/2 Syrg, 1 mL by Miscellaneous route once a week., Disp: 12 Syringe, Rfl: 6  ???  tbo-filgrastim (GRANIX) 480 mcg/0.8 mL Syrg injection, Inject under the skin every seven (7) days., Disp: , Rfl:   ???  teriparatide (FORTEO) 20 mcg/dose - 600 mcg/2.4 mL injection, Inject 0.08 mL (20 mcg total) under the skin daily., Disp: 2.4 mL, Rfl: 11  ???  VIT A/VIT C/VIT E/ZINC/COPPER (PRESERVISION AREDS ORAL), Take 1 tablet by mouth daily., Disp: , Rfl:

## 2019-03-13 ENCOUNTER — Other Ambulatory Visit: Payer: Self-pay | Admitting: Cardiology

## 2019-03-19 MED ORDER — OXYCODONE-ACETAMINOPHEN 7.5 MG-325 MG TABLET: 1 | tablet | Freq: Four times a day (QID) | 0 refills | 30 days | Status: AC

## 2019-03-29 MED ORDER — OXYCODONE-ACETAMINOPHEN 7.5 MG-325 MG TABLET
ORAL_TABLET | Freq: Four times a day (QID) | ORAL | 0 refills | 30.00000 days | Status: CP | PRN
Start: 2019-03-29 — End: 2019-03-19

## 2019-04-06 MED ORDER — ALPRAZOLAM 0.5 MG TABLET
ORAL_TABLET | 0 refills | 0 days | Status: CP
Start: 2019-04-06 — End: 2019-04-12

## 2019-04-12 MED ORDER — ALPRAZOLAM 0.5 MG TABLET
ORAL_TABLET | Freq: Three times a day (TID) | ORAL | 0 refills | 10.00000 days | Status: CP | PRN
Start: 2019-04-12 — End: ?

## 2019-04-12 NOTE — Unmapped (Signed)
Xanax refill  Last ov: 03/11/2019   Next ov: 06/11/2019    Per pharmacy, Xanax needs attending provider signature/approval. Script pended.

## 2019-04-12 NOTE — Unmapped (Signed)
Addended by: Loreli Slot on: 04/12/2019 01:04 PM     Modules accepted: Orders

## 2019-04-12 NOTE — Unmapped (Signed)
Reason for call:     Pharmacy stated that RX of ALPRAZolam Prudy Feeler) 0.5 MG tablet needs to be resubmitted with the attending provider (Rivadeneira) because Dr. Nancy Marus DEA is hospital-associated and their system does not recognize it as being valid.     Last ov: 03/11/2019  Next ov: 06/11/2019

## 2019-04-29 MED ORDER — OXYCODONE-ACETAMINOPHEN 7.5 MG-325 MG TABLET
ORAL_TABLET | Freq: Four times a day (QID) | ORAL | 0 refills | 30.00000 days | Status: CP | PRN
Start: 2019-04-29 — End: 2020-04-28

## 2019-05-12 ENCOUNTER — Other Ambulatory Visit: Payer: Self-pay | Admitting: Cardiology

## 2019-05-17 DIAGNOSIS — F419 Anxiety disorder, unspecified: Secondary | ICD-10-CM

## 2019-05-17 NOTE — Unmapped (Signed)
Reason for call:     Patient requesting refill of ALPRAZolam (XANAX) 0.5 MG tablet to be sent to [facility] pharmacy    Last ov: 03/11/2019  Next ov: 06/11/2019

## 2019-05-17 NOTE — Unmapped (Signed)
Reason for call:   Patient also wanted to inform Dr. Elvera Lennox, that he has had 2 Ratuximab infusions, both 6 hours long. He had no side effects, did not lose hair, and feels pretty good.     Last ov: 03/11/2019  Next ov: 05/17/2019

## 2019-05-17 NOTE — Unmapped (Signed)
Narcotic, Xanax refill  Last ov: 03/11/2019   Next ov: 06/11/2019

## 2019-05-18 MED ORDER — ALPRAZOLAM 0.5 MG TABLET
ORAL_TABLET | Freq: Three times a day (TID) | ORAL | 0 refills | 10 days | Status: CP | PRN
Start: 2019-05-18 — End: ?

## 2019-06-08 NOTE — Unmapped (Signed)
Reason for call:   Pt called in requesting for a refill on medication of FORTEO to please be sent to [facility] specialty pharmacy.  Fax No. [phone]  Phone No. [phone]    Thanks     Last ov: 03/11/2019  Next ov: 06/11/2019

## 2019-06-09 MED ORDER — TERIPARATIDE 20 MCG/DOSE (600 MCG/2.4 ML) SUBCUTANEOUS PEN INJECTOR: 20 ug | mL | Freq: Every day | 11 refills | 30 days | Status: AC

## 2019-06-09 NOTE — Unmapped (Signed)
Addended by: Mellody Life on: 06/09/2019 09:20 AM     Modules accepted: Orders

## 2019-06-09 NOTE — Unmapped (Signed)
Forteo refill  Last ov: 03/11/2019   Next ov: 06/11/2019     Labs:   AST   Date Value Ref Range Status   01/28/2018 30 19 - 55 U/L Final   10/14/2017 32 0 - 40 IU/L Final     ALT   Date Value Ref Range Status   01/28/2018 24 19 - 72 U/L Final   10/14/2017 32 0 - 44 IU/L Final     Creatinine   Date Value Ref Range Status   01/28/2018 0.63 (L) 0.70 - 1.30 mg/dL Final   16/05/9603 5.40 (L) 0.76 - 1.27 mg/dL Final     WBC   Date Value Ref Range Status   02/18/2018 7.0 4.5 - 11.0 10*9/L Final   02/12/2018 4.0 3.4 - 10.8 x10E3/uL Final     HGB   Date Value Ref Range Status   02/18/2018 12.8 (L) 13.5 - 17.5 g/dL Final   98/06/9146 82.9 (L) 13.0 - 17.7 g/dL Final     HCT   Date Value Ref Range Status   02/18/2018 39.5 (L) 40.5 - 52.5 % Final   02/12/2018 38.0 37.5 - 51.0 % Final     MCV   Date Value Ref Range Status   02/18/2018 88.0 83.0 - 97.0 fL Final   02/12/2018 87 79 - 97 fL Final     RDW   Date Value Ref Range Status   02/18/2018 18.9 (H) 11.9 - 16.0 % Final   02/12/2018 19.0 (H) 12.3 - 15.4 % Final     Platelet   Date Value Ref Range Status   02/18/2018 158 150 - 400 10*9/L Final   02/12/2018 171 150 - 450 x10E3/uL Final     Neutrophils %   Date Value Ref Range Status   02/18/2018 68.0 50.0 - 73.0 % Final     Lymphocytes %   Date Value Ref Range Status   02/18/2018 27.1 25.0 - 40.0 % Final   02/12/2018 19 Not Estab. % Final     Monocytes %   Date Value Ref Range Status   02/18/2018 4.9 4.0 - 12.0 % Final   02/12/2018 12 Not Estab. % Final     Eosinophils %   Date Value Ref Range Status   02/12/2018 0 Not Estab. % Final     Basophils %   Date Value Ref Range Status   02/12/2018 0 Not Estab. % Final

## 2019-06-09 NOTE — Unmapped (Signed)
Addended by: Mellody Life on: 06/09/2019 09:18 AM     Modules accepted: Orders

## 2019-06-11 ENCOUNTER — Institutional Professional Consult (permissible substitution): Admit: 2019-06-11 | Discharge: 2019-06-12 | Payer: MEDICARE | Attending: Rheumatology | Primary: Rheumatology

## 2019-06-11 MED ORDER — PREGABALIN 100 MG CAPSULE: 100 mg | capsule | Freq: Two times a day (BID) | 6 refills | 30 days | Status: AC

## 2019-06-11 MED ORDER — OXYCODONE-ACETAMINOPHEN 7.5 MG-325 MG TABLET: 1 | tablet | Freq: Four times a day (QID) | 0 refills | 30 days | Status: AC

## 2019-06-11 MED ORDER — PREDNISONE 5 MG TABLET: 5 mg | tablet | Freq: Every day | 5 refills | 30 days | Status: AC

## 2019-06-11 MED ORDER — PREDNISONE 2.5 MG TABLET: 3 mg | tablet | Freq: Every day | 5 refills | 30 days | Status: AC

## 2019-06-11 MED ORDER — BD TUBERCULIN SYRINGE 1 ML 27 X 1/2": 1 mL | Syringe | 6 refills | 0 days | Status: AC

## 2019-06-11 MED ORDER — PREDNISONE 1 MG TABLET: 2 mg | tablet | Freq: Every day | 5 refills | 30 days | Status: AC

## 2019-06-11 MED ORDER — METHOTREXATE SODIUM 25 MG/ML INJECTION SOLUTION: 20 mg | mL | 6 refills | 84 days | Status: AC

## 2019-06-11 NOTE — Unmapped (Addendum)
I am located off-site and the patient is located off-site for this visit.      I spent 42 minutes on the phone  with the patient. I spent an additional 12 minutes on pre- and post-visit activities.     The patient was physically located in [place] or a state in which I am permitted to provide care. The patient understood that s/he may incur co-pays and cost sharing, and agreed to the telemedicine visit. The visit was completed via phone and/or video, which was appropriate and reasonable under the circumstances given the patient's presentation at the time.    The patient has been advised of the potential risks and limitations of this mode of treatment (including, but not limited to, the absence of in-person examination) and has agreed to be treated using telemedicine. The patient's/patient's family's questions regarding telemedicine have been answered.     If the phone/video visit was completed in an ambulatory setting, the patient has also been advised to contact their provider???s office for worsening conditions, and seek emergency medical treatment and/or call 911 if the patient deems either necessary.          ASSESSMENT and PLAN:    1-RA, stable. S/p rituxan.   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   3-OP and compression fractures, on Forteo now for 10 months.  4-Neuropahty.     Continue to check MTX labs every 3 months.   -     methotrexate 25 mg/mL injection solution; Inject 0.8 mL (20 mg total) under the skin once a week.  -     syringe with needle (BD TUBERCULIN SYRINGE) 1 mL 27 x 1/2 Syrg; 1 mL by Miscellaneous route once a week.  -     Folic acid 1 mg/d.     -     Increase pregabalin (LYRICA) 100 MG capsule for peripheral neuropathy; Take 1 capsule (100 mg total) by mouth Two (2) times a day.    Reduce prednisone to 7.5 mg/d for 4 weeks then, stay on 5 mg/d.       Compression fracture of T12 vertebra with routine healing  -     oxyCODONE-acetaminophen (PERCOCET) 7.5-325 mg per tablet; Take 1 tablet by mouth every six (6) hours as needed for pain.     Continue vitamin D supplementation and Forteo for now for OP.        RTC in 3 months.         Cc: stable joint pain.     HPI:    The patient is a 68 y.o. 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 We consulted Dr. Cala Bradford, we had planned to treat him with rituximab x2.   He is therefore still taking prednisone 10 mg/d and sq MTX 20 mg/week. Fortunately, his white cell counts have been much improved lately and he has not needed Granix therapy in several weeks.     In terms of his Osteoporosis with several compression fractures, he is now on Forteo ( so far for 7 months) and the last x-ray done by his local orthopedist did not show new fractures and showed some improvement fo the bone density per his report.     In the interim, he received two 1000 mg doses of rituximab on 03/30/19 and 04/13/19.  He saw pulmonary on 06/09/19 for follow up of a LUL nodule that seemed reduced in size. He is on 3L of O2  supplementation.  Saw his hematologist Dr. Deboraha Sprang on 04/13/19. His WBC have been very good and has not needed re-dosing with Granix for at least 8 weeks now.   He reported today decreased low back pain over the past two weeks and denied today peripheral joint pain or swelling. No AM stiffness. He does experience neuropathic symptoms in feet and more recently in hands. He is now on Lyrica 75 mg BID for this.     Reported weight by the patient today was 229 pounds, T=98.1 F, BP= 126/76. O2 sats on 3L+ 96%. Serum glucose=121.     He denied fever, chills, WL, cough, dyspnea, chest pain or GI complaints today. He is s/p influenza vaccination on 06/09/19.   Labs from 05/25/19: WBC: 11.7, hg=13.2, PLT=207, 000, ANC=10.1. Normal chem-7 and LFTs. See care everywhere.     ROS:  all systems were reviewed and negative except as noted in the HPI.      Immunization History   Administered Date(s) Administered   ??? INFLUENZA TIV (TRI) PF (IM) 06/28/2011   ??? Influenza Virus Vaccine, unspecified formulation 05/16/2015, 04/26/2017   ??? PNEUMOCOCCAL POLYSACCHARIDE 23 04/09/2011   ??? Pneumococcal Conjugate 13-Valent 09/12/2013   ??? Pneumococcal Conjugate, Unspecified Formulation 04/26/2017        Past Medical History:   Diagnosis Date   ??? CAD (coronary artery disease)    ??? CHF (congestive heart failure) (CMS-HCC)    ??? COPD (chronic obstructive pulmonary disease) (CMS-HCC)    ??? Diabetes mellitus (CMS-HCC) 2008    Type 2   ??? DJD (degenerative joint disease)    ??? GERD (gastroesophageal reflux disease)    ??? Hyperlipidemia    ??? Hypertension    ??? MI (myocardial infarction) (CMS-HCC) 1999, 2077   ??? Osteomyelitis of foot (CMS-HCC)    ??? Osteoporosis 2012   ??? Peripheral neuropathy    ??? Pneumonia, aspiration (CMS-HCC)    ??? Psoriasis    ??? RA (rheumatoid arthritis) (CMS-HCC)    ??? Sicca (CMS-HCC)          Current Outpatient Medications:   ???  albuterol (PROVENTIL HFA;VENTOLIN HFA) 90 mcg/actuation inhaler, Inhale., Disp: , Rfl:   ???  ALPRAZolam (XANAX) 0.5 MG tablet, Take 1 tablet (0.5 mg total) by mouth Three (3) times a day as needed for sleep., Disp: 30 tablet, Rfl: 0  ???  ascorbic acid (VITAMIN C) 100 MG tablet, Take 500 mg by mouth daily. , Disp: , Rfl:   ???  aspirin (ECOTRIN) 81 MG tablet, Take 81 mg by mouth once daily., Disp: , Rfl:   ???  atorvastatin (LIPITOR) 80 MG tablet, Take 80 mg by mouth nightly., Disp: , Rfl:   ???  blood sugar diagnostic Strp, Test blood sugar no more than twice daily., Disp: , Rfl:   ???  cyanocobalamin (VITAMIN B-12) 250 MCG tablet, Take 250 mcg by mouth daily., Disp: , Rfl:   ???  dextromethorphan-guaifenesin (MUCINEX DM) 30-600 mg Tb12, Take by mouth., Disp: , Rfl:   ???  ELIQUIS 5 mg Tab, Take 5 mg by mouth Two (2) times a day., Disp: , Rfl: 0  ???  ferrous sulfate dried (SLOW FE) 160 mg (50 mg iron) TbER, Take 50 mg by mouth once daily. Perfect iron, Disp: , Rfl:   ???  fluticasone-umeclidin-vilanter (TRELEGY ELLIPTA) 100-62.5-25 mcg inhaler, Inhale 1 puff., Disp: , Rfl:   ???  folic acid (FOLVITE) 400 MCG tablet, Take 2 tablets (800 mcg total) by mouth daily., Disp: 180 tablet, Rfl:  3  ???  furosemide (LASIX) 20 MG tablet, Take 60 mg by mouth Take as directed. 2 tabs in the am, 1 tab in the afternoon., Disp: , Rfl:   ???  gel base no.41, bulk, (HYDROGEL) Gel, 85 g by Miscellaneous route daily., Disp: 85 g, Rfl: 2  ???  loteprednol (LOTEMAX) 0.2 % DrpS, 1 drop Four (4) times a day., Disp: , Rfl:   ???  MELATONIN ORAL, Take 9 mg by mouth nightly. , Disp: , Rfl:   ???  methotrexate 25 mg/mL injection solution, Inject 0.8 mL (20 mg total) under the skin once a week., Disp: 10 mL, Rfl: 6  ???  metoprolol tartrate (LOPRESSOR) 25 MG tablet, Take 50 mg by mouth Two (2) times a day. , Disp: , Rfl:   ???  mucus clearing device Devi, Blow through several times, 2 or 3 sets per day as needed, Disp: , Rfl:   ???  mupirocin (BACTROBAN) 2 % ointment, , Disp: , Rfl: 0  ???  NITROSTAT 0.4 mg SL tablet, Place 1 tablet under the tongue once as needed. , Disp: , Rfl:   ???  ONETOUCH ULTRA TEST Strp, , Disp: , Rfl: 12  ???  ONETOUCH ULTRASOFT lancets, , Disp: , Rfl: 0  ???  oxyCODONE-acetaminophen (PERCOCET) 7.5-325 mg per tablet, Take 1 tablet by mouth every six (6) hours as needed for pain., Disp: 120 tablet, Rfl: 0  ???  oxyCODONE-acetaminophen (PERCOCET) 7.5-325 mg per tablet, Take 1 tablet by mouth every six (6) hours as needed for pain., Disp: 120 tablet, Rfl: 0  ???  pantoprazole (PROTONIX) 40 MG tablet, Take 40 mg by mouth Two (2) times a day. , Disp: , Rfl:   ???  potassium chloride SA (K-DUR,KLOR-CON) 10 MEQ tablet, TAKE 4 TS PO QAM AND TK 2 TS D AT 4PM, Disp: , Rfl: 9  ???  predniSONE (DELTASONE) 10 MG tablet, Take 1 tablet (10 mg total) by mouth daily., Disp: 30 tablet, Rfl: 5  ???  sodium chloride (OCEAN) 0.65 % nasal spray, 1 spray into each nostril., Disp: , Rfl:   ???  syringe with needle (BD TUBERCULIN SYRINGE) 1 mL 27 x 1/2 Syrg, 1 mL by Miscellaneous route once a week., Disp: 12 Syringe, Rfl: 6  ???  tbo-filgrastim (GRANIX) 480 mcg/0.8 mL Syrg injection, Inject under the skin every seven (7) days., Disp: , Rfl:   ???  teriparatide (FORTEO) 20 mcg/dose - 600 mcg/2.4 mL injection, Inject 0.08 mL (20 mcg total) under the skin daily., Disp: 2.4 mL, Rfl: 11  ???  VIT A/VIT C/VIT E/ZINC/COPPER (PRESERVISION AREDS ORAL), Take 1 tablet by mouth daily., Disp: , Rfl:     PE:

## 2019-06-23 DIAGNOSIS — M8000XA Age-related osteoporosis with current pathological fracture, unspecified site, initial encounter for fracture: Principal | ICD-10-CM

## 2019-06-23 MED ORDER — TERIPARATIDE 20 MCG/DOSE (600 MCG/2.4 ML) SUBCUTANEOUS PEN INJECTOR
Freq: Every day | SUBCUTANEOUS | 3 refills | 90 days | Status: CP
Start: 2019-06-23 — End: 2020-06-22

## 2019-06-23 NOTE — Unmapped (Signed)
[facility] - PHARMACIST NOTES     Forteo prescription sent to [facility] at the request of MAP team for renewal of mfg assistance.       Chelsea Aus

## 2019-06-23 NOTE — Unmapped (Signed)
Per test claim for FORTEO at the [facility], patient needs Medication Assistance Program for High Copay.

## 2019-07-19 NOTE — Unmapped (Signed)
Reason for call: Pharmacy called in requesting refills on syringe with needle (BD TUBERCULIN SYRINGE) to please be sent to them.     Thanks       Last ov: 03/11/2019  Next ov: 09/24/2019

## 2019-07-19 NOTE — Unmapped (Signed)
Called pharmacy and confirmed that script from 06/11/2019 was received.  Pharmacist is thinking that patient is asking for a different syringe.    Pharmacist will call patient and confirm script request.

## 2019-08-13 DIAGNOSIS — F419 Anxiety disorder, unspecified: Principal | ICD-10-CM

## 2019-08-13 MED ORDER — ALPRAZOLAM 0.5 MG TABLET
ORAL_TABLET | 0 refills | 0 days | Status: CP
Start: 2019-08-13 — End: ?

## 2019-08-14 ENCOUNTER — Other Ambulatory Visit: Payer: Self-pay | Admitting: Cardiology

## 2019-09-24 ENCOUNTER — Telehealth: Admit: 2019-09-24 | Discharge: 2019-09-25 | Payer: MEDICARE | Attending: Rheumatology | Primary: Rheumatology

## 2019-09-24 DIAGNOSIS — M059 Rheumatoid arthritis with rheumatoid factor, unspecified: Principal | ICD-10-CM

## 2019-09-24 DIAGNOSIS — M069 Rheumatoid arthritis, unspecified: Principal | ICD-10-CM

## 2019-09-24 MED ORDER — METHOTREXATE SODIUM 25 MG/ML INJECTION SOLUTION
SUBCUTANEOUS | 6 refills | 84 days | Status: CP
Start: 2019-09-24 — End: ?

## 2019-09-24 MED ORDER — PREGABALIN 100 MG CAPSULE
ORAL_CAPSULE | Freq: Three times a day (TID) | ORAL | 6 refills | 30.00000 days | Status: CP
Start: 2019-09-24 — End: 2020-09-23

## 2019-09-24 MED ORDER — BD TUBERCULIN SYRINGE 1 ML 27 X 1/2"
INJECTION | 6 refills | 0 days | Status: CP
Start: 2019-09-24 — End: ?

## 2019-09-24 MED ORDER — PREDNISONE 5 MG TABLET
ORAL_TABLET | Freq: Every day | ORAL | 3 refills | 90.00000 days | Status: CP
Start: 2019-09-24 — End: ?

## 2019-09-24 NOTE — Unmapped (Signed)
I am located on site and the patient is located off-site for this visit.      I spent 28 minutes on video with the patient. I spent an additional 12 minutes on pre- and post-visit activities.     The patient was physically located in [place] or a state in which I am permitted to provide care. The patient understood that s/he may incur co-pays and cost sharing, and agreed to the telemedicine visit. The visit was completed via phone and/or video, which was appropriate and reasonable under the circumstances given the patient's presentation at the time.    The patient has been advised of the potential risks and limitations of this mode of treatment (including, but not limited to, the absence of in-person examination) and has agreed to be treated using telemedicine. The patient's/patient's family's questions regarding telemedicine have been answered.     If the phone/video visit was completed in an ambulatory setting, the patient has also been advised to contact their provider???s office for worsening conditions, and seek emergency medical treatment and/or call 911 if the patient deems either necessary.      ASSESSMENT and PLAN:    1-RA, doing very well s/p rituximab. He has been able to reduce prednisone to 5 mg/d.   2-Autoimmune neutropenia, resolved.  3-OP and compression fractures, on Forteo now for 14 months. Will have repeat BMD later this year.   4-Peripheral neuropathy of hands and feet.     Continue to check MTX labs every 3 months.     -     Increase pregabalin (LYRICA) 100 MG capsule; Take 1 capsule (100 mg total) by mouth Three (3) times a day for PN.  Continue:  -     syringe with needle (BD TUBERCULIN SYRINGE) 1 mL 27 x 1/2 Syrg; 1 mL by Miscellaneous route once a week.  -     methotrexate 25 mg/mL injection solution; Inject 0.8 mL (20 mg total) under the skin once a week.  -     predniSONE (DELTASONE) 5 MG tablet; Take 1 tablet (5 mg total) by mouth daily.  -     oxyCODONE-acetaminophen (PERCOCET) 7.5-325 mg per tablet; Take 1 tablet by mouth every six (6) hours as needed for pain.        Osteoporosis with current pathological fracture with routine healing, unspecified osteoporosis type, subsequent encounter    Compression fracture of T12 vertebra with routine healing  Continue Forteo for a total of 24 months. He is on month 14.       RTC in 3 months.         Cc: paresthesias of hands and feet .     HPI:    The patient is a 69 y.o. 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 We consulted Dr. Cala Bradford, we had planned to treat him with rituximab x2.   He is therefore still taking prednisone 10 mg/d and sq MTX 20 mg/week. Fortunately, his white cell counts have been much improved lately and he has not needed Granix therapy in several weeks.     In terms of his Osteoporosis with several compression fractures, he is now on Forteo ( so far for 14 months) and the last x-ray done by his local orthopedist did not show new fractures and showed some improvement fo the bone density per his report.     He received two 1000 mg doses of rituximab on 03/30/19  and 04/13/19.  He saw pulmonary on 06/09/19 for follow up of a LUL nodule that seemed reduced in size. He is on 3L of O2 supplementation.      Labs from 09/22/19: ANC= 8.9. Normal chem-7 and LFTs. See care everywhere.     Today he reported that his is doing well in terms of his RA. He does endorse increased neuropathic type pain in his hands with numbness, pain and sensation of cold. He denied joint pain or swelling in his hands or elsewhere. Also denied morning stiffness. He does have restricted extension and stiffness at the knees. He has tolerated the reduction fo prednisone dose very well. He is currently at 5 mg/d.      Additionally, his back pain is much better especially in the past two weeks. He rates the pain which is not persistent, a 1-2/10.  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 Subjectively, he denied significant dyspnea, cough, PND or orthopnea. He still requires supplemental oxygen ( 3L).   He denied also, recent fever, chills, WL, LAD, skin rashes or GI symptoms.     ROS:  all systems were reviewed and negative except as noted in the HPI.      Immunization History   Administered Date(s) Administered   ??? INFLUENZA TIV (TRI) PF (IM) 06/28/2011   ??? Influenza Virus Vaccine, unspecified formulation 05/16/2015, 04/26/2017   ??? PNEUMOCOCCAL POLYSACCHARIDE 23 04/09/2011   ??? Pneumococcal Conjugate 13-Valent 09/12/2013   ??? Pneumococcal Conjugate, Unspecified Formulation 04/26/2017        Past Medical History:   Diagnosis Date   ??? CAD (coronary artery disease)    ??? CHF (congestive heart failure) (CMS-HCC)    ??? COPD (chronic obstructive pulmonary disease) (CMS-HCC)    ??? Diabetes mellitus (CMS-HCC) 2008    Type 2   ??? DJD (degenerative joint disease)    ??? GERD (gastroesophageal reflux disease)    ??? Hyperlipidemia    ??? Hypertension    ??? MI (myocardial infarction) (CMS-HCC) 1999, 2077   ??? Osteomyelitis of foot (CMS-HCC)    ??? Osteoporosis 2012   ??? Peripheral neuropathy    ??? Pneumonia, aspiration (CMS-HCC)    ??? Psoriasis    ??? RA (rheumatoid arthritis) (CMS-HCC)    ??? Sicca (CMS-HCC)          Current Outpatient Medications:   ???  albuterol (PROVENTIL HFA;VENTOLIN HFA) 90 mcg/actuation inhaler, Inhale., Disp: , Rfl:   ???  ALPRAZolam (XANAX) 0.5 MG tablet, TAKE ONE TABLET BY MOUTH THREE TIMES A DAY AS NEEDED FOR SLEEP, Disp: 30 tablet, Rfl: 0  ???  ascorbic acid (VITAMIN C) 100 MG tablet, Take 500 mg by mouth daily. , Disp: , Rfl:   ???  aspirin (ECOTRIN) 81 MG tablet, Take 81 mg by mouth once daily., Disp: , Rfl:   ???  atorvastatin (LIPITOR) 80 MG tablet, Take 80 mg by mouth nightly., Disp: , Rfl:   ???  blood sugar diagnostic Strp, Test blood sugar no more than twice daily., Disp: , Rfl:   ???  cyanocobalamin (VITAMIN B-12) 250 MCG tablet, Take 250 mcg by mouth daily., Disp: , Rfl:   ???  dextromethorphan-guaifenesin (MUCINEX DM) 30-600 mg Tb12, Take by mouth., Disp: , Rfl:   ???  ELIQUIS 5 mg Tab, Take 5 mg by mouth Two (2) times a day., Disp: , Rfl: 0  ???  ferrous sulfate dried (SLOW FE) 160 mg (50 mg iron) TbER,  Take 50 mg by mouth once daily. Perfect iron, Disp: , Rfl:   ???  fluticasone-umeclidin-vilanter (TRELEGY ELLIPTA) 100-62.5-25 mcg inhaler, Inhale 1 puff., Disp: , Rfl:   ???  folic acid (FOLVITE) 400 MCG tablet, Take 2 tablets (800 mcg total) by mouth daily., Disp: 180 tablet, Rfl: 3  ???  furosemide (LASIX) 20 MG tablet, Take 60 mg by mouth Take as directed. 2 tabs in the am, 1 tab in the afternoon., Disp: , Rfl:   ???  gel base no.41, bulk, (HYDROGEL) Gel, 85 g by Miscellaneous route daily., Disp: 85 g, Rfl: 2  ???  loteprednol (LOTEMAX) 0.2 % DrpS, 1 drop Four (4) times a day., Disp: , Rfl:   ???  MELATONIN ORAL, Take 9 mg by mouth nightly. , Disp: , Rfl:   ???  methotrexate 25 mg/mL injection solution, Inject 0.8 mL (20 mg total) under the skin once a week., Disp: 10 mL, Rfl: 6  ???  metoprolol tartrate (LOPRESSOR) 25 MG tablet, Take 50 mg by mouth Two (2) times a day. , Disp: , Rfl:   ???  mucus clearing device Devi, Blow through several times, 2 or 3 sets per day as needed, Disp: , Rfl:   ???  mupirocin (BACTROBAN) 2 % ointment, , Disp: , Rfl: 0  ???  NITROSTAT 0.4 mg SL tablet, Place 1 tablet under the tongue once as needed. , Disp: , Rfl:   ???  ONETOUCH ULTRA TEST Strp, , Disp: , Rfl: 12  ???  ONETOUCH ULTRASOFT lancets, , Disp: , Rfl: 0  ???  oxyCODONE-acetaminophen (PERCOCET) 7.5-325 mg per tablet, Take 1 tablet by mouth every six (6) hours as needed for pain., Disp: 120 tablet, Rfl: 0  ???  pantoprazole (PROTONIX) 40 MG tablet, Take 40 mg by mouth Two (2) times a day. , Disp: , Rfl:   ???  potassium chloride SA (K-DUR,KLOR-CON) 10 MEQ tablet, TAKE 4 TS PO QAM AND TK 2 TS D AT 4PM, Disp: , Rfl: 9  ???  predniSONE (DELTASONE) 5 MG tablet, Take 1 tablet (5 mg total) by mouth daily., Disp: 30 tablet, Rfl: 5  ???  pregabalin (LYRICA) 100 MG capsule, Take 1 capsule (100 mg total) by mouth Two (2) times a day., Disp: 60 capsule, Rfl: 6  ???  sodium chloride (OCEAN) 0.65 % nasal spray, 1 spray into each nostril., Disp: , Rfl:   ???  syringe with needle (BD TUBERCULIN SYRINGE) 1 mL 27 x 1/2 Syrg, 1 mL by Miscellaneous route once a week., Disp: 12 Syringe, Rfl: 6  ???  tbo-filgrastim (GRANIX) 480 mcg/0.8 mL Syrg injection, Inject under the skin every seven (7) days., Disp: , Rfl:   ???  teriparatide (FORTEO) 20 mcg/dose - 600 mcg/2.4 mL injection, Inject 0.08 mL (20 mcg total) under the skin daily., Disp: 7.2 mL, Rfl: 3  ???  VIT A/VIT C/VIT E/ZINC/COPPER (PRESERVISION AREDS ORAL), Take 1 tablet by mouth daily., Disp: , Rfl:   ???  oxyCODONE-acetaminophen (PERCOCET) 7.5-325 mg per tablet, Take 1 tablet by mouth every six (6) hours as needed for pain. (Patient not taking: Reported on 09/23/2019), Disp: 120 tablet, Rfl: 0  ???  oxyCODONE-acetaminophen (PERCOCET) 7.5-325 mg per tablet, Take 1 tablet by mouth every six (6) hours as needed for pain. (Patient not taking: Reported on 09/23/2019), Disp: 120 tablet, Rfl: 0  ???  predniSONE (DELTASONE) 1 MG tablet, Take 2 tablets (2 mg total) by mouth daily. (Patient not taking: Reported on 09/23/2019), Disp: 60 tablet, Rfl:  5  ???  predniSONE (DELTASONE) 2.5 MG tablet, Take 1 tablet (2.5 mg total) by mouth daily. (Patient not taking: Reported on 09/23/2019), Disp: 30 tablet, Rfl: 5    PE:  Reported vital signs today:  BP: 110/70. P: 59. T: 96.75F. O2 sat on 3L: 96%.   GENERAL: NAD. In good spirits.   Skin: no vasculitis lesions. No visible inflammatory lesions.  EYES: sclera non-injected. No scleromalacia.  MOUTH:No exudates or ulcers.   NECK: no visible masses or LAD.   MSK: advanced RA deformtities but no swelling across the MCPs or PIPs, wrists, elbows. There is restricted ROM of the wrists but not at the elbows. Shoulders, Restricted flexion and internal rotation. Neck, no definite restriction.     PE:

## 2019-09-27 MED ORDER — OXYCODONE-ACETAMINOPHEN 7.5 MG-325 MG TABLET
ORAL_TABLET | Freq: Four times a day (QID) | ORAL | 0 refills | 30.00000 days | Status: CP | PRN
Start: 2019-09-27 — End: ?

## 2019-10-15 DIAGNOSIS — F419 Anxiety disorder, unspecified: Principal | ICD-10-CM

## 2019-10-19 MED ORDER — ALPRAZOLAM 0.5 MG TABLET
ORAL_TABLET | 0 refills | 0 days | Status: CP
Start: 2019-10-19 — End: ?

## 2019-10-25 MED ORDER — OXYCODONE-ACETAMINOPHEN 7.5 MG-325 MG TABLET
ORAL_TABLET | Freq: Four times a day (QID) | ORAL | 0 refills | 30 days | Status: CP | PRN
Start: 2019-10-25 — End: ?

## 2019-11-03 MED ORDER — METFORMIN 500 MG TABLET
ORAL | 0 days
Start: 2019-11-03 — End: ?

## 2019-11-07 DIAGNOSIS — M059 Rheumatoid arthritis with rheumatoid factor, unspecified: Principal | ICD-10-CM

## 2019-11-08 MED ORDER — LOTEPREDNOL ETABONATE 0.5 % EYE DROPS,SUSPENSION
6 refills | 0 days | Status: CP
Start: 2019-11-08 — End: ?

## 2019-11-08 NOTE — Unmapped (Signed)
Loteprednol refill  Last ov: 09/24/2019  Next ov: Visit date not found

## 2019-11-25 MED ORDER — OXYCODONE-ACETAMINOPHEN 7.5 MG-325 MG TABLET
ORAL_TABLET | Freq: Four times a day (QID) | ORAL | 0 refills | 30 days | Status: CP | PRN
Start: 2019-11-25 — End: 2020-11-24

## 2019-12-23 DIAGNOSIS — F419 Anxiety disorder, unspecified: Principal | ICD-10-CM

## 2019-12-23 MED ORDER — ALPRAZOLAM 0.5 MG TABLET
ORAL_TABLET | 0 refills | 0 days | Status: CP
Start: 2019-12-23 — End: ?

## 2019-12-31 ENCOUNTER — Telehealth: Admit: 2019-12-31 | Discharge: 2020-01-01 | Payer: MEDICARE | Attending: Rheumatology | Primary: Rheumatology

## 2019-12-31 DIAGNOSIS — M8000XA Age-related osteoporosis with current pathological fracture, unspecified site, initial encounter for fracture: Principal | ICD-10-CM

## 2019-12-31 DIAGNOSIS — M069 Rheumatoid arthritis, unspecified: Principal | ICD-10-CM

## 2019-12-31 DIAGNOSIS — S22080D Wedge compression fracture of T11-T12 vertebra, subsequent encounter for fracture with routine healing: Principal | ICD-10-CM

## 2019-12-31 DIAGNOSIS — M05742 Rheumatoid arthritis with rheumatoid factor of left hand without organ or systems involvement: Principal | ICD-10-CM

## 2019-12-31 DIAGNOSIS — M05741 Rheumatoid arthritis with rheumatoid factor of right hand without organ or systems involvement: Principal | ICD-10-CM

## 2019-12-31 DIAGNOSIS — D708 Other neutropenia: Principal | ICD-10-CM

## 2019-12-31 DIAGNOSIS — M059 Rheumatoid arthritis with rheumatoid factor, unspecified: Principal | ICD-10-CM

## 2019-12-31 DIAGNOSIS — F419 Anxiety disorder, unspecified: Principal | ICD-10-CM

## 2019-12-31 MED ORDER — ALPRAZOLAM 0.5 MG TABLET
ORAL_TABLET | Freq: Three times a day (TID) | ORAL | 0 refills | 10.00000 days | Status: CP | PRN
Start: 2019-12-31 — End: ?

## 2019-12-31 MED ORDER — PREDNISONE 5 MG TABLET
ORAL_TABLET | Freq: Every day | ORAL | 3 refills | 90.00000 days | Status: CP
Start: 2019-12-31 — End: ?

## 2019-12-31 MED ORDER — OXYCODONE-ACETAMINOPHEN 7.5 MG-325 MG TABLET
ORAL_TABLET | Freq: Four times a day (QID) | ORAL | 0 refills | 30 days | Status: CP | PRN
Start: 2019-12-31 — End: ?

## 2019-12-31 MED ORDER — METHOTREXATE SODIUM 25 MG/ML INJECTION SOLUTION
SUBCUTANEOUS | 6 refills | 84.00000 days | Status: CP
Start: 2019-12-31 — End: ?

## 2019-12-31 MED ORDER — PREGABALIN 100 MG CAPSULE
ORAL_CAPSULE | Freq: Three times a day (TID) | ORAL | 6 refills | 30 days | Status: CP
Start: 2019-12-31 — End: 2020-12-30

## 2019-12-31 MED ORDER — PREDNISONE 1 MG TABLET
ORAL_TABLET | Freq: Every day | ORAL | 6 refills | 30.00000 days | Status: CP
Start: 2019-12-31 — End: 2020-12-30

## 2019-12-31 NOTE — Unmapped (Signed)
I am located off site and the patient is located off-site for this visit.      I spent 26 minutes on video with the patient. I spent an additional 12 minutes on pre- and post-visit activities.     The patient was physically located in [place] or a state in which I am permitted to provide care. The patient understood that s/he may incur co-pays and cost sharing, and agreed to the telemedicine visit. The visit was completed via phone and/or video, which was appropriate and reasonable under the circumstances given the patient's presentation at the time.    The patient has been advised of the potential risks and limitations of this mode of treatment (including, but not limited to, the absence of in-person examination) and has agreed to be treated using telemedicine. The patient's/patient's family's questions regarding telemedicine have been answered.     If the phone/video visit was completed in an ambulatory setting, the patient has also been advised to contact their provider???s office for worsening conditions, and seek emergency medical treatment and/or call 911 if the patient deems either necessary.      ASSESSMENT and PLAN:    1-RA, continues to do well s/p rituximab.   2-Autoimmune neutropenia, resolved.  3-OP and compression fractures, on Forteo now for 14 months. He will have repeat BMD later this year locally.   4-Peripheral neuropathy of hands and feet. Improved symptom control.   5-S/p COVID 19 vaccination.     Continue to check MTX labs every 3 months.     Continue:    -    Pregabalin (LYRICA) 100 MG capsule; Take 1 capsule (100 mg total) by mouth Three (3) times a day for PN.  Continue:  -     syringe with needle (BD TUBERCULIN SYRINGE) 1 mL 27 x 1/2 Syrg; 1 mL by Miscellaneous route once a week.  -     methotrexate 25 mg/mL injection solution; Inject 0.8 mL (20 mg total) under the skin once a week.  -     Reduce predniSONE (DELTASONE) 1 MG tablet; Take 1 tablet (4 mg total) by mouth daily.  - oxyCODONE-acetaminophen (PERCOCET) 7.5-325 mg per tablet; Take 1 tablet by mouth every six (6) hours as needed for pain.        Osteoporosis with current pathological fracture with routine healing, unspecified osteoporosis type, subsequent encounter    Compression fracture of T12 vertebra with routine healing  Continue Forteo for a total of 24 months. He is on month 17.     Would like to check anti-COVID 19 antibody titers to see is he has mounted an appropriate immunity against COVID-19. Will see if he can get those done with his hemaotlogist.     RTC in 3 months.         Cc: doing very well .     HPI:    The patient is a 69 y.o. 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 He is s/p two 1000 mg doses of rituximab on 03/30/19 and 04/13/19.   In terms of his Osteoporosis with several compression fractures, he is now on Forteo ( so far for 17 months) and the last x-ray done by his local orthopedist did not show new fractures and showed some improvement fo the bone density per his report.     Labs from 5/5 showed: WBC=10.7, ANC=9.09, ALC=0.8, hg= 14, hematocrit=41.9%, PLTs=205, normal  CMP except for serum glucose of 123.       Today he reported that he is doing well and denied peripheral joint pain or swelling. Has some mild low back pain. Has lost weight intentionally. He is now on Metformin for hyperglycemia started by his PCP and serum glucose is much better. He also reported stable respiratory symptoms and denied cough or dyspnea. He is on 3L of O2. His followed closely by pulmonary locally for his severe COPD and ILD.   His neuropathy symptoms are better controlled now with the higher doses of pregabalin.  He has received full vaccination against COVID-19 without difficulties.     ROS:  all systems were reviewed and negative except as noted in the HPI.      Immunization History   Administered Date(s) Administered   ??? COVID-19 VACC,MRNA,(PFIZER)(PF)(IM) 10/21/2019, 11/18/2019   ??? INFLUENZA TIV (TRI) 89MO+ W/ PRESERV (IM) 06/26/2011, 05/08/2012   ??? INFLUENZA TIV (TRI) PF (IM) 06/28/2011, 05/16/2015, 04/26/2017   ??? Influenza Quad Adjuvanted PF 8YRS+ 06/09/2019   ??? Influenza Vaccine Quad (IIV4 PF) 57mo+ injectable 04/26/2013, 05/11/2014, 05/16/2015, 05/09/2016   ??? Influenza Virus Vaccine, unspecified formulation 05/16/2015, 04/26/2017   ??? Influenza Whole 06/24/2007, 04/26/2008, 06/21/2009   ??? Influenza, High Dose (IIV4) 65 yrs & older 05/13/2017, 05/13/2017, 06/08/2018   ??? PNEUMOCOCCAL POLYSACCHARIDE 23 06/09/2006, 04/09/2011, 05/13/2017   ??? Pneumococcal Conjugate 13-Valent 09/12/2013, 05/11/2014   ??? Pneumococcal Conjugate, Unspecified Formulation 04/26/2017   ??? Pneumococcal, Unspecified Formulation 06/09/2006, 04/09/2011, 05/13/2017   ??? TdaP 05/08/2012        Past Medical History:   Diagnosis Date   ??? CAD (coronary artery disease)    ??? CHF (congestive heart failure) (CMS-HCC)    ??? COPD (chronic obstructive pulmonary disease) (CMS-HCC)    ??? Diabetes mellitus (CMS-HCC) 2008    Type 2   ??? DJD (degenerative joint disease)    ??? GERD (gastroesophageal reflux disease)    ??? Hyperlipidemia    ??? Hypertension    ??? MI (myocardial infarction) (CMS-HCC) 1999, 2077   ??? Osteomyelitis of foot (CMS-HCC)    ??? Osteoporosis 2012   ??? Peripheral neuropathy    ??? Pneumonia, aspiration (CMS-HCC)    ??? Psoriasis    ??? RA (rheumatoid arthritis) (CMS-HCC)    ??? Sicca (CMS-HCC)          Current Outpatient Medications:   ???  albuterol (PROVENTIL HFA;VENTOLIN HFA) 90 mcg/actuation inhaler, Inhale., Disp: , Rfl:   ???  ALPRAZolam (XANAX) 0.5 MG tablet, Take 1 tablet (0.5 mg total) by mouth Three (3) times a day as needed for sleep., Disp: 30 tablet, Rfl: 0  ???  ascorbic acid (VITAMIN C) 100 MG tablet, Take 500 mg by mouth daily. , Disp: , Rfl:   ???  aspirin (ECOTRIN) 81 MG tablet, Take 81 mg by mouth once daily., Disp: , Rfl: ???  atorvastatin (LIPITOR) 80 MG tablet, Take 80 mg by mouth nightly., Disp: , Rfl:   ???  blood sugar diagnostic Strp, Test blood sugar no more than twice daily., Disp: , Rfl:   ???  cyanocobalamin (VITAMIN B-12) 250 MCG tablet, Take 250 mcg by mouth daily., Disp: , Rfl:   ???  dextromethorphan-guaifenesin (MUCINEX DM) 30-600 mg Tb12, Take by mouth., Disp: , Rfl:   ???  ELIQUIS 5 mg Tab, Take 5 mg by mouth Two (2) times a day., Disp: , Rfl: 0  ???  fluticasone-umeclidin-vilanter (TRELEGY ELLIPTA) 100-62.5-25 mcg inhaler, Inhale 1 puff., Disp: , Rfl:   ???  folic acid (FOLVITE) 400 MCG tablet, Take 2 tablets (800 mcg total) by mouth daily., Disp: 180 tablet, Rfl: 3  ???  furosemide (LASIX) 20 MG tablet, Take 60 mg by mouth Take as directed. 2 tabs in the am, 1 tab in the afternoon., Disp: , Rfl:   ???  gel base no.41, bulk, (HYDROGEL) Gel, 85 g by Miscellaneous route daily., Disp: 85 g, Rfl: 2  ???  loteprednol (LOTEMAX) 0.5 % ophthalmic suspension, INSTILL 1 DROP INTO BOTH EYES MEDROL DOSE PACK SCHEDULING ONLY FOR 1 DOSE, Disp: 5 mL, Rfl: 6  ???  MELATONIN ORAL, Take 9 mg by mouth nightly. , Disp: , Rfl:   ???  metFORMIN (GLUCOPHAGE) 500 MG tablet, Take 500 mg by mouth. Use as directed, Disp: , Rfl:   ???  methotrexate 25 mg/mL injection solution, Inject 0.8 mL (20 mg total) under the skin once a week., Disp: 10 mL, Rfl: 6  ???  metoprolol tartrate (LOPRESSOR) 25 MG tablet, Take 50 mg by mouth Two (2) times a day. , Disp: , Rfl:   ???  mucus clearing device Devi, Blow through several times, 2 or 3 sets per day as needed, Disp: , Rfl:   ???  mupirocin (BACTROBAN) 2 % ointment, , Disp: , Rfl: 0  ???  NITROSTAT 0.4 mg SL tablet, Place 1 tablet under the tongue once as needed. , Disp: , Rfl:   ???  ONETOUCH ULTRA TEST Strp, , Disp: , Rfl: 12  ???  ONETOUCH ULTRASOFT lancets, , Disp: , Rfl: 0  ???  oxyCODONE-acetaminophen (PERCOCET) 7.5-325 mg per tablet, Take 1 tablet by mouth every six (6) hours as needed for pain., Disp: 120 tablet, Rfl: 0  ???  [START ON 01/31/2020] oxyCODONE-acetaminophen (PERCOCET) 7.5-325 mg per tablet, Take 1 tablet by mouth every six (6) hours as needed for pain., Disp: 120 tablet, Rfl: 0  ???  [START ON 03/01/2020] oxyCODONE-acetaminophen (PERCOCET) 7.5-325 mg per tablet, Take 1 tablet by mouth every six (6) hours as needed for pain., Disp: 120 tablet, Rfl: 0  ???  pantoprazole (PROTONIX) 40 MG tablet, Take 40 mg by mouth Two (2) times a day. , Disp: , Rfl:   ???  potassium chloride SA (K-DUR,KLOR-CON) 10 MEQ tablet, TAKE 4 TS PO QAM AND TK 2 TS D AT 4PM, Disp: , Rfl: 9  ???  predniSONE (DELTASONE) 5 MG tablet, Take 1 tablet (5 mg total) by mouth daily., Disp: 90 tablet, Rfl: 3  ???  pregabalin (LYRICA) 100 MG capsule, Take 1 capsule (100 mg total) by mouth Three (3) times a day., Disp: 90 capsule, Rfl: 6  ???  RITUXIMAB IV, Infuse into a venous catheter., Disp: , Rfl:   ???  sodium chloride (OCEAN) 0.65 % nasal spray, 1 spray into each nostril., Disp: , Rfl:   ???  syringe with needle (BD TUBERCULIN SYRINGE) 1 mL 27 x 1/2 Syrg, 1 mL by Miscellaneous route once a week., Disp: 12 Syringe, Rfl: 6  ???  tbo-filgrastim (GRANIX) 480 mcg/0.8 mL Syrg injection, Inject under the skin every seven (7) days., Disp: , Rfl:   ???  teriparatide (FORTEO) 20 mcg/dose - 600 mcg/2.4 mL injection, Inject 0.08 mL (20 mcg total) under the skin daily., Disp: 7.2 mL, Rfl: 3  ???  VIT A/VIT C/VIT E/ZINC/COPPER (PRESERVISION AREDS ORAL), Take 1 tablet by mouth daily., Disp: , Rfl:   ???  predniSONE (DELTASONE) 1 MG tablet, Take 4 tablets (4 mg total) by mouth daily., Disp: 120 tablet, Rfl: 6  PE:  Reported vital signs today:  BP: 128/80. T:97.7 F. O2 sat on 3L: 97-8 %.   GENERAL: NAD. In good spirits.   Skin: no vasculitis lesions. No visible inflammatory lesions.  EYES: sclera non-injected. No scleromalacia.  MOUTH:No exudates or ulcers.   NECK: no visible masses or LAD.   MSK: advanced RA deformtities but no swelling across the MCPs or PIPs, wrists, elbows. There is restricted ROM of the wrists but not at the elbows. Shoulders,  Restricted flexion and internal rotation. Neck, no definite restriction.

## 2020-01-05 DIAGNOSIS — M057A Rheumatoid arthritis of other site with positive rheumatoid factor: Principal | ICD-10-CM

## 2020-01-19 NOTE — Unmapped (Signed)
Reason for call: Pt is requesting to speak urgently to either Dr .Elvera Lennox or a nurse. He stated that its regarding him having both covid vaccines and how its reacting.       Last ov: Visit date not found  Next ov: 04/20/2020

## 2020-01-20 NOTE — Unmapped (Signed)
Spoke with patient regarding concerns.  Patient stated that he had an antibody test the results were neg.  Would like to know what steps to take now.  If you would like to see him or if he should see Dr. Latrelle Dodrill at [facility]?  Please advise.

## 2020-01-20 NOTE — Unmapped (Signed)
Called and spoke with patient.  He verbalized understanding and said thank you.

## 2020-01-25 NOTE — Unmapped (Signed)
I spoke with the patient today and reviewed in detail the current knowledge about antibody testing after being vaccinated against COVID-19 in patients who are immunosuppressed.  I reviewed information disseminated last week by the ACR in which it was made clear that we do not know how to interpret undetectable antibody levels in patients who received the vaccine as the assays are not quantitative or semiquantitative. Also, explained to him that the assay does not measure the cellular immune response which is also trigered by the vaccine and should provide protection against the infection as well. I did explain that although his immune  Response is likely to be less robust as compared to a person who is not immunocompromised, it should at least make an infection less severe.  I also recommended that he continues to implement social distancing and wearing masks in situations that place him at a higher risk of infection. He expressed understanding of this and agreed. I explained that the availability of scientific data that supports these measures is rapidly evolving and recommendations may change in the next few weeks and that I would keep him posted on any new information that becomes available.         Staff Message    Anderson Malta Elder, CMA  You 5 days ago   JE     Called and spoke with patient.  He verbalized understanding and said thank you.         Documentation    You  Anderson Malta Elder, CMA 6 days ago   AR  Please tell him that I will consult with an ID expert here at [facility] and will call him when I speak with them. Please ask him to give me a couple of days. Thanks.     Message text    Jacob Moores, CMA  You 6 days ago   JE     Spoke with patient regarding concerns.  Patient stated that he had an antibody test the results were neg.  Would like to know what steps to take now.  If you would like to see him or if he should see Dr. Latrelle Dodrill at [facility]?  Please advise.           Documentation    You  Silver Huguenin; [facility] Clinical Staff; De Burrs, LPN 6 days ago   AR  Tiffany, can you call him to see what is going on? ??Thanks.     Message text    Silver Huguenin routed conversation to [facility] Clinical Staff 6 days ago   Silver Huguenin 6 days ago   DD     Reason for call: Pt is requesting to speak urgently to either Dr .Elvera Lennox or a nurse. He stated that its regarding him having both covid vaccines and how its reacting.   ??  ??  Last ov: Visit date not found  Next ov: 04/20/2020          Documentation    [employer], [place] [phone]  Silver Huguenin 6 days ago   DM  Pt is requesting to speak urgently to either Dr .Elvera Lennox or a nurse. He stated that its regarding him having both covid vaccines and how its reacting.     Incoming call        Recent Patient Communication     Last Update Reason Specialty     5 days ago Staff Message Rheumatology     Corky Sox  Rheumatology Specialty [facility] Clearnce Hasten Closed     1 month ago Other Rheumatology     [facility] Rheumatology Specialty [facility] Altamese Deguire, Margurite Auerbach Frontin Closed

## 2020-01-31 MED ORDER — OXYCODONE-ACETAMINOPHEN 7.5 MG-325 MG TABLET
ORAL_TABLET | Freq: Four times a day (QID) | ORAL | 0 refills | 30.00000 days | Status: CP | PRN
Start: 2020-01-31 — End: ?

## 2020-03-01 MED ORDER — OXYCODONE-ACETAMINOPHEN 7.5 MG-325 MG TABLET
ORAL_TABLET | Freq: Four times a day (QID) | ORAL | 0 refills | 30 days | Status: CP | PRN
Start: 2020-03-01 — End: 2021-03-01

## 2020-03-16 MED ORDER — HYDROXYZINE HCL 25 MG TABLET
Freq: Every evening | ORAL | 0 days
Start: 2020-03-16 — End: ?

## 2020-03-21 DIAGNOSIS — M059 Rheumatoid arthritis with rheumatoid factor, unspecified: Principal | ICD-10-CM

## 2020-03-21 DIAGNOSIS — S22080D Wedge compression fracture of T11-T12 vertebra, subsequent encounter for fracture with routine healing: Principal | ICD-10-CM

## 2020-03-21 DIAGNOSIS — M8000XA Age-related osteoporosis with current pathological fracture, unspecified site, initial encounter for fracture: Principal | ICD-10-CM

## 2020-03-21 DIAGNOSIS — M05772 Rheumatoid arthritis with rheumatoid factor of left ankle and foot without organ or systems involvement: Principal | ICD-10-CM

## 2020-03-21 DIAGNOSIS — M069 Rheumatoid arthritis, unspecified: Principal | ICD-10-CM

## 2020-03-21 DIAGNOSIS — M05741 Rheumatoid arthritis with rheumatoid factor of right hand without organ or systems involvement: Principal | ICD-10-CM

## 2020-03-21 DIAGNOSIS — M05742 Rheumatoid arthritis with rheumatoid factor of left hand without organ or systems involvement: Principal | ICD-10-CM

## 2020-03-21 DIAGNOSIS — M05771 Rheumatoid arthritis with rheumatoid factor of right ankle and foot without organ or systems involvement: Principal | ICD-10-CM

## 2020-03-21 MED ORDER — OXYCODONE-ACETAMINOPHEN 7.5 MG-325 MG TABLET
ORAL_TABLET | Freq: Four times a day (QID) | ORAL | 0 refills | 30.00000 days | PRN
Start: 2020-03-21 — End: 2021-03-21

## 2020-03-21 MED ORDER — METHOTREXATE SODIUM 25 MG/ML INJECTION SOLUTION
SUBCUTANEOUS | 6 refills | 84.00000 days
Start: 2020-03-21 — End: ?

## 2020-03-21 MED ORDER — FOLIC ACID 400 MCG TABLET
ORAL_TABLET | Freq: Every day | ORAL | 3 refills | 90.00000 days
Start: 2020-03-21 — End: ?

## 2020-03-21 MED ORDER — PREDNISONE 1 MG TABLET
ORAL_TABLET | Freq: Every day | ORAL | 6 refills | 30 days
Start: 2020-03-21 — End: 2021-03-21

## 2020-03-21 NOTE — Unmapped (Signed)
Reason for call: Pt called in requesting to speak with Dr. Elvera Lennox. Pt stated that it's regarding getting a booster for his pfizer vaccine? Pt stated that antibodies after pfizer vaccine showed that he wasnt protected & would really like to speak w/ Dr. Elvera Lennox.    Thanks       Last ov: Visit date not found  Next ov: 04/20/2020

## 2020-03-21 NOTE — Unmapped (Signed)
Dr. Elvera Lennox,    Patient states he recently had testing to see if he was protected against COVID since he's had his vaccine.   His test came back stating he was not protected so that is his reason for asking about the Pfizer booster. I explained to him that a recommendation for a booster has not come out yet.    He wanted to know if we knew when the recommendation from the CDC would be made and I explained as well that we do not receive this information first hand. He also wanted to know if we would know this information in the next few weeks or within the next month. I explained we won't know when this information is received.     At any rate, patient will wait for the recommendations.    He is also requesting several medication refills as well.    Tiff

## 2020-03-21 NOTE — Unmapped (Signed)
Addended by: Rachel Bo D on: 03/21/2020 02:54 PM     Modules accepted: Orders

## 2020-03-23 ENCOUNTER — Other Ambulatory Visit: Payer: Self-pay | Admitting: Cardiology

## 2020-04-20 ENCOUNTER — Other Ambulatory Visit: Payer: Self-pay | Admitting: Cardiology

## 2020-04-20 ENCOUNTER — Telehealth: Admit: 2020-04-20 | Discharge: 2020-04-21 | Payer: MEDICARE | Attending: Rheumatology | Primary: Rheumatology

## 2020-04-20 MED ORDER — METHOTREXATE SODIUM 25 MG/ML INJECTION SOLUTION
SUBCUTANEOUS | 6 refills | 84 days | Status: CP
Start: 2020-04-20 — End: ?

## 2020-04-20 MED ORDER — PREDNISONE 1 MG TABLET
ORAL_TABLET | Freq: Every day | ORAL | 6 refills | 40.00000 days | Status: CP
Start: 2020-04-20 — End: 2021-04-20

## 2020-04-20 MED ORDER — BD TUBERCULIN SYRINGE 1 ML 27 X 1/2"
3 refills | 0 days | Status: CP
Start: 2020-04-20 — End: ?

## 2020-04-20 MED ORDER — PREGABALIN 100 MG CAPSULE
ORAL_CAPSULE | Freq: Three times a day (TID) | ORAL | 6 refills | 30 days | Status: CP
Start: 2020-04-20 — End: 2021-04-20

## 2020-04-20 NOTE — Unmapped (Unsigned)
I am located on site and the patient is located off-site for this visit.      I spent 22 minutes on video with the patient. I spent an additional 11 minutes on pre- and post-visit activities.     The patient was physically located in [place] or a state in which I am permitted to provide care. The patient understood that s/he may incur co-pays and cost sharing, and agreed to the telemedicine visit. The visit was completed via phone and/or video, which was appropriate and reasonable under the circumstances given the patient's presentation at the time.    The patient has been advised of the potential risks and limitations of this mode of treatment (including, but not limited to, the absence of in-person examination) and has agreed to be treated using telemedicine. The patient's/patient's family's questions regarding telemedicine have been answered.     If the phone/video visit was completed in an ambulatory setting, the patient has also been advised to contact their provider???s office for worsening conditions, and seek emergency medical treatment and/or call 911 if the patient deems either necessary.      ASSESSMENT and PLAN:    1-RA, continues to do well s/p rituximab.   2-Autoimmune neutropenia, resolved.  3-OP and compression fractures, on Forteo. He has 3 more months of treatment left. He will have arepeat BMD later this year locally.   4-Peripheral neuropathy of hands and feet. Improved symptom control.   5-Second COVID 19 vaccination round with the Moderna vaccine as opposed to [employer] booster,  per recommendations of his pulmonologist.     Continue to check MTX labs every 3 months.     Continue:    -    Pregabalin (LYRICA) 100 MG capsule; Take 1 capsule (100 mg total) by mouth Three (3) times a day for PN.  Continue:  -     syringe with needle (BD TUBERCULIN SYRINGE) 1 mL 27 x 1/2 Syrg; 1 mL by Miscellaneous route once a week.  -     methotrexate 25 mg/mL injection solution; Inject 0.8 mL (20 mg total) under the skin once a week.  -     Reduce predniSONE (DELTASONE) 1 MG tablet; Take 1 tablet (3 mg total) by mouth daily.  -     oxyCODONE-acetaminophen (PERCOCET) 7.5-325 mg per tablet; Take 1 tablet by mouth every six (6) hours as needed for pain.    Osteoporosis with current pathological fracture with routine healing, unspecified osteoporosis type, subsequent encounter    Compression fracture of T12 vertebra with routine healing  Continue Forteo for a total of 24 months. He is on month 17.       RTC in 3 months.         Cc: doing very well .     HPI:    The patient is a 69 y.o. 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 He is s/p two 1000 mg doses of rituximab on 03/30/19 and 04/13/19.     In terms of his Osteoporosis with several compression fractures, he is now on Forteo ( so far for 17 months) and the last x-ray done by his local orthopedist did not show new fractures and showed some improvement fo the bone density per his report.     He reported that his RA continues do well and denied recent flares or current joint swelling.  He has his usual  pain in his hands which is unchanged. He has have paresthesias in his fingers and feet but, adequately controlled by Pregabalin.     ROS:  all systems were reviewed and negative except as noted in the HPI.      Immunization History   Administered Date(s) Administered   ??? COVID-19 VACC,MRNA,(PFIZER)(PF)(IM) 10/21/2019, 11/18/2019   ??? INFLUENZA TIV (TRI) 35MO+ W/ PRESERV (IM) 06/26/2011, 05/08/2012   ??? INFLUENZA TIV (TRI) PF (IM) 06/28/2011, 05/16/2015, 04/26/2017   ??? Influenza Quad Adjuvanted PF 58YRS+ 06/09/2019   ??? Influenza Vaccine Quad (IIV4 PF) 28mo+ injectable 04/26/2013, 05/11/2014, 05/16/2015, 05/09/2016   ??? Influenza Virus Vaccine, unspecified formulation 05/16/2015, 04/26/2017   ??? Influenza Whole 06/24/2007, 04/26/2008, 06/21/2009   ??? Influenza, High Dose (IIV4) 65 yrs & older 05/13/2017, 05/13/2017, 06/08/2018   ??? PNEUMOCOCCAL POLYSACCHARIDE 23 06/09/2006, 04/09/2011, 05/13/2017   ??? Pneumococcal Conjugate 13-Valent 09/12/2013, 05/11/2014   ??? Pneumococcal Conjugate, Unspecified Formulation 04/26/2017   ??? Pneumococcal, Unspecified Formulation 06/09/2006, 04/09/2011, 05/13/2017   ??? TdaP 05/08/2012        Past Medical History:   Diagnosis Date   ??? CAD (coronary artery disease)    ??? CHF (congestive heart failure) (CMS-HCC)    ??? COPD (chronic obstructive pulmonary disease) (CMS-HCC)    ??? Diabetes mellitus (CMS-HCC) 2008    Type 2   ??? DJD (degenerative joint disease)    ??? GERD (gastroesophageal reflux disease)    ??? Hyperlipidemia    ??? Hypertension    ??? MI (myocardial infarction) (CMS-HCC) 1999, 2077   ??? Osteomyelitis of foot (CMS-HCC)    ??? Osteoporosis 2012   ??? Peripheral neuropathy    ??? Pneumonia, aspiration (CMS-HCC)    ??? Psoriasis    ??? RA (rheumatoid arthritis) (CMS-HCC)    ??? Sicca (CMS-HCC)          Current Outpatient Medications:   ???  albuterol (PROVENTIL HFA;VENTOLIN HFA) 90 mcg/actuation inhaler, Inhale., Disp: , Rfl:   ???  ALPRAZolam (XANAX) 0.5 MG tablet, Take 1 tablet (0.5 mg total) by mouth Three (3) times a day as needed for sleep., Disp: 30 tablet, Rfl: 0  ???  ascorbic acid (VITAMIN C) 100 MG tablet, Take 500 mg by mouth daily. , Disp: , Rfl:   ???  aspirin (ECOTRIN) 81 MG tablet, Take 81 mg by mouth once daily., Disp: , Rfl:   ???  atorvastatin (LIPITOR) 80 MG tablet, Take 80 mg by mouth nightly., Disp: , Rfl:   ???  blood sugar diagnostic Strp, Test blood sugar no more than twice daily., Disp: , Rfl:   ???  cyanocobalamin (VITAMIN B-12) 250 MCG tablet, Take 250 mcg by mouth daily., Disp: , Rfl:   ???  dextromethorphan-guaifenesin (MUCINEX DM) 30-600 mg Tb12, Take by mouth., Disp: , Rfl:   ???  ELIQUIS 5 mg Tab, Take 5 mg by mouth Two (2) times a day., Disp: , Rfl: 0  ???  fluticasone-umeclidin-vilanter (TRELEGY ELLIPTA) 100-62.5-25 mcg inhaler, Inhale 1 puff., Disp: , Rfl:   ???  folic acid (FOLVITE) 400 MCG tablet, Take 2 tablets (800 mcg total) by mouth daily., Disp: 180 tablet, Rfl: 3  ???  furosemide (LASIX) 20 MG tablet, Take 60 mg by mouth Take as directed. 2 tabs in the am, 1 tab in the afternoon., Disp: , Rfl:   ???  hydrOXYzine (ATARAX) 25 MG tablet, Take 25 mg by mouth nightly., Disp: , Rfl:   ???  loteprednol (LOTEMAX) 0.5 % ophthalmic suspension, INSTILL 1 DROP INTO BOTH EYES  MEDROL DOSE PACK SCHEDULING ONLY FOR 1 DOSE, Disp: 5 mL, Rfl: 6  ???  MELATONIN ORAL, Take 9 mg by mouth nightly. , Disp: , Rfl:   ???  metFORMIN (GLUCOPHAGE) 500 MG tablet, Take 500 mg by mouth. Use as directed, Disp: , Rfl:   ???  methotrexate 25 mg/mL injection solution, Inject 0.8 mL (20 mg total) under the skin once a week., Disp: 10 mL, Rfl: 6  ???  metoprolol tartrate (LOPRESSOR) 25 MG tablet, Take 50 mg by mouth Two (2) times a day. , Disp: , Rfl:   ???  mucus clearing device Devi, Blow through several times, 2 or 3 sets per day as needed, Disp: , Rfl:   ???  NITROSTAT 0.4 mg SL tablet, Place 1 tablet under the tongue once as needed. , Disp: , Rfl:   ???  ONETOUCH ULTRA TEST Strp, , Disp: , Rfl: 12  ???  ONETOUCH ULTRASOFT lancets, , Disp: , Rfl: 0  ???  oxyCODONE-acetaminophen (PERCOCET) 7.5-325 mg per tablet, Take 1 tablet by mouth every six (6) hours as needed for pain., Disp: 120 tablet, Rfl: 0  ???  oxyCODONE-acetaminophen (PERCOCET) 7.5-325 mg per tablet, Take 1 tablet by mouth every six (6) hours as needed for pain., Disp: 120 tablet, Rfl: 0  ???  oxyCODONE-acetaminophen (PERCOCET) 7.5-325 mg per tablet, Take 1 tablet by mouth every six (6) hours as needed for pain., Disp: 120 tablet, Rfl: 0  ???  pantoprazole (PROTONIX) 40 MG tablet, Take 40 mg by mouth Two (2) times a day. , Disp: , Rfl:   ???  potassium chloride SA (K-DUR,KLOR-CON) 10 MEQ tablet, TAKE 4 TS PO QAM AND TK 2 TS D AT 4PM, Disp: , Rfl: 9  ???  predniSONE (DELTASONE) 1 MG tablet, Take 4 tablets (4 mg total) by mouth daily., Disp: 120 tablet, Rfl: 6  ???  pregabalin (LYRICA) 100 MG capsule, Take 1 capsule (100 mg total) by mouth Three (3) times a day., Disp: 90 capsule, Rfl: 6  ???  RITUXIMAB IV, Infuse into a venous catheter., Disp: , Rfl:   ???  sodium chloride (OCEAN) 0.65 % nasal spray, 1 spray into each nostril., Disp: , Rfl:   ???  syringe with needle (BD TUBERCULIN SYRINGE) 1 mL 27 x 1/2 Syrg, 1 mL by Miscellaneous route once a week., Disp: 12 Syringe, Rfl: 6  ???  tbo-filgrastim (GRANIX) 480 mcg/0.8 mL Syrg injection, Inject under the skin every seven (7) days., Disp: , Rfl:   ???  teriparatide (FORTEO) 20 mcg/dose - 600 mcg/2.4 mL injection, Inject 0.08 mL (20 mcg total) under the skin daily., Disp: 7.2 mL, Rfl: 3  ???  TURMERIC, BULK, MISC, 500 mg by Miscellaneous route every morning before breakfast., Disp: , Rfl:   ???  VIT A/VIT C/VIT E/ZINC/COPPER (PRESERVISION AREDS ORAL), Take 1 tablet by mouth daily., Disp: , Rfl:   ???  gel base no.41, bulk, (HYDROGEL) Gel, 85 g by Miscellaneous route daily., Disp: 85 g, Rfl: 2  ???  mupirocin (BACTROBAN) 2 % ointment, , Disp: , Rfl: 0  ???  predniSONE (DELTASONE) 5 MG tablet, Take 1 tablet (5 mg total) by mouth daily. (Patient taking differently: Take 4 mg by mouth daily. ), Disp: 90 tablet, Rfl: 3    PE:  Reported vital signs today:  BP: 103/72. T:97.5 F. P; 64. O2 sat on 3L: 98 %.   GENERAL: NAD. In good spirits.   Skin: no vasculitis lesions. No visible inflammatory lesions.  EYES: sclera  non-injected. No scleromalacia.  MOUTH:No exudates or ulcers.   NECK: no visible masses or LAD.   MSK: advanced RA deformtities but no swelling across the MCPs or PIPs, wrists, elbows. There is restricted ROM of the wrists but not at the elbows. Shoulders,  Restricted flexion and internal rotation. Neck, no definite restriction. No scleromalacia.  MOUTH:No exudates or ulcers.   NECK: no visible masses or LAD.   MSK: advanced RA deformtities but no swelling across the MCPs or PIPs, wrists, elbows. There is restricted ROM of the wrists but not at the elbows. Shoulders,  Restricted flexion and internal rotation. Neck, no definite restriction.

## 2020-04-25 DIAGNOSIS — M05741 Rheumatoid arthritis with rheumatoid factor of right hand without organ or systems involvement: Principal | ICD-10-CM

## 2020-04-25 DIAGNOSIS — M059 Rheumatoid arthritis with rheumatoid factor, unspecified: Principal | ICD-10-CM

## 2020-04-25 DIAGNOSIS — M8000XA Age-related osteoporosis with current pathological fracture, unspecified site, initial encounter for fracture: Principal | ICD-10-CM

## 2020-04-25 DIAGNOSIS — M05742 Rheumatoid arthritis with rheumatoid factor of left hand without organ or systems involvement: Principal | ICD-10-CM

## 2020-04-25 MED ORDER — OXYCODONE-ACETAMINOPHEN 7.5 MG-325 MG TABLET
ORAL_TABLET | Freq: Four times a day (QID) | ORAL | 0 refills | 30.00000 days | Status: CP | PRN
Start: 2020-04-25 — End: 2021-04-25

## 2020-04-25 NOTE — Unmapped (Signed)
oxycodone refill  Last ov: Visit date not found   Next ov: 08/11/2020

## 2020-04-25 NOTE — Unmapped (Signed)
Reason for call: Pt is needing a refill on his medication of oxyCODONE-acetaminophen to please be sent to Pharmacy on file.     Thanks      Last ov: Visit date not found  Next ov: 08/11/2020

## 2020-04-25 NOTE — Unmapped (Signed)
Addended by: Thornell Sartorius on: 04/25/2020 03:28 PM     Modules accepted: Orders

## 2020-05-10 NOTE — Unmapped (Signed)
Reason for call: Patrick Hunter is requesting for a call back. Pt stated that he is scheduled for his 2nd Modera shot tomorrow and takes his MXT every Friday. Pt needs to know how  to proceed.     Thanks       Last ov: Visit date not found  Next ov: 08/11/2020

## 2020-05-11 NOTE — Unmapped (Signed)
Patient notified and verbalized understanding. 

## 2020-05-19 ENCOUNTER — Other Ambulatory Visit: Payer: Self-pay | Admitting: Cardiology

## 2020-05-23 NOTE — Unmapped (Signed)
Reason for call:   Pt is requesting if a order for his rituximab could please be sent to Dr. Belenda Cruise. Phone # [phone].    thanks     Last ov: Visit date not found  Next ov: 08/11/2020

## 2020-05-24 NOTE — Unmapped (Signed)
Called patient.  Feb/March of 2021.  Antibody test done 1 month later. Test showed patient had no antibodies to COVID.    He met with his pulmonologist who recommended patient receive the Moderna vaccine. Patients completed Moderna but was told not to take methotrexate.    Last week, patient took his first methotrexate injection but his pain has not subsided.    Patient wants to restart Rituximab to get control of his pain.    He is supposed to have a repeat antibody test next month to see if Gala Murdoch is working.    Patient is wondering if Rituximab weakens the COVID vaccine. Also, should patient rely on the Moderna vaccine providing the coverage he needs.      Please call patient to discuss further.

## 2020-05-25 NOTE — Unmapped (Signed)
Patient injected himself last Wednesday and this past Wednesday.  Prior to this, he had been off of the methotrexate for 3 weeks.     He is agreeable to not going back on rituximab.  Patient would like a script for prednisone and taper down.    Please sent script to patients [facility] pharmacy.    Tiff

## 2020-05-26 DIAGNOSIS — M059 Rheumatoid arthritis with rheumatoid factor, unspecified: Principal | ICD-10-CM

## 2020-05-26 MED ORDER — PREDNISONE 5 MG TABLET
ORAL_TABLET | ORAL | 3 refills | 0.00000 days | Status: CP
Start: 2020-05-26 — End: ?

## 2020-05-26 NOTE — Unmapped (Signed)
Reason for call:     Pt is following up on prednisone because his level of pain is at a 10 right now.     He said that he has a little prednisone left that he could start on to get through the weekend but needs to know what dose to start.     Please give him a call    Last ov: Visit date not found  Next ov: 08/11/2020

## 2020-05-31 NOTE — Unmapped (Signed)
Reason for call: pt is requesting for a refill on his oxyCODONE-acetaminophen to please be sent to [facility] [address] - [address], [place] - [address] AT [address]       Thanks       Last ov: Visit date not found  Next ov: 08/11/2020

## 2020-06-05 DIAGNOSIS — M8000XA Age-related osteoporosis with current pathological fracture, unspecified site, initial encounter for fracture: Principal | ICD-10-CM

## 2020-06-05 DIAGNOSIS — M05742 Rheumatoid arthritis with rheumatoid factor of left hand without organ or systems involvement: Principal | ICD-10-CM

## 2020-06-05 DIAGNOSIS — M153 Secondary multiple arthritis: Principal | ICD-10-CM

## 2020-06-05 DIAGNOSIS — M059 Rheumatoid arthritis with rheumatoid factor, unspecified: Principal | ICD-10-CM

## 2020-06-05 DIAGNOSIS — M05741 Rheumatoid arthritis with rheumatoid factor of right hand without organ or systems involvement: Principal | ICD-10-CM

## 2020-06-05 DIAGNOSIS — S22080D Wedge compression fracture of T11-T12 vertebra, subsequent encounter for fracture with routine healing: Principal | ICD-10-CM

## 2020-06-05 DIAGNOSIS — M069 Rheumatoid arthritis, unspecified: Principal | ICD-10-CM

## 2020-06-05 MED ORDER — OXYCODONE-ACETAMINOPHEN 7.5 MG-325 MG TABLET
ORAL_TABLET | Freq: Four times a day (QID) | ORAL | 0 refills | 30.00000 days | Status: CP | PRN
Start: 2020-06-05 — End: ?

## 2020-07-06 MED ORDER — OXYCODONE-ACETAMINOPHEN 7.5 MG-325 MG TABLET
ORAL_TABLET | Freq: Four times a day (QID) | ORAL | 0 refills | 30 days | Status: CP | PRN
Start: 2020-07-06 — End: 2020-06-05

## 2020-07-18 ENCOUNTER — Other Ambulatory Visit: Payer: Self-pay | Admitting: Cardiology

## 2020-08-05 MED ORDER — OXYCODONE-ACETAMINOPHEN 7.5 MG-325 MG TABLET
ORAL_TABLET | Freq: Four times a day (QID) | ORAL | 0 refills | 30 days | Status: CP | PRN
Start: 2020-08-05 — End: 2021-08-05

## 2020-08-11 ENCOUNTER — Telehealth: Admit: 2020-08-11 | Discharge: 2020-08-12 | Payer: MEDICARE | Attending: Rheumatology | Primary: Rheumatology

## 2020-08-11 DIAGNOSIS — M8000XA Age-related osteoporosis with current pathological fracture, unspecified site, initial encounter for fracture: Principal | ICD-10-CM

## 2020-08-11 DIAGNOSIS — M05741 Rheumatoid arthritis with rheumatoid factor of right hand without organ or systems involvement: Principal | ICD-10-CM

## 2020-08-11 DIAGNOSIS — M059 Rheumatoid arthritis with rheumatoid factor, unspecified: Principal | ICD-10-CM

## 2020-08-11 DIAGNOSIS — M05742 Rheumatoid arthritis with rheumatoid factor of left hand without organ or systems involvement: Principal | ICD-10-CM

## 2020-08-11 DIAGNOSIS — M069 Rheumatoid arthritis, unspecified: Principal | ICD-10-CM

## 2020-08-11 DIAGNOSIS — S22080D Wedge compression fracture of T11-T12 vertebra, subsequent encounter for fracture with routine healing: Principal | ICD-10-CM

## 2020-08-11 MED ORDER — PREDNISONE 1 MG TABLET
ORAL_TABLET | Freq: Every day | ORAL | 6 refills | 40 days | Status: CP
Start: 2020-08-11 — End: 2021-08-11

## 2020-08-11 MED ORDER — BD TUBERCULIN SYRINGE 1 ML 27 X 1/2"
3 refills | 0 days | Status: CP
Start: 2020-08-11 — End: ?

## 2020-08-11 MED ORDER — PREGABALIN 100 MG CAPSULE
ORAL_CAPSULE | Freq: Three times a day (TID) | ORAL | 6 refills | 30.00000 days | Status: CP
Start: 2020-08-11 — End: 2021-08-11

## 2020-08-11 MED ORDER — METHOTREXATE SODIUM 25 MG/ML INJECTION SOLUTION
SUBCUTANEOUS | 6 refills | 84 days | Status: CP
Start: 2020-08-11 — End: ?

## 2020-08-11 MED ORDER — OXYCODONE-ACETAMINOPHEN 7.5 MG-325 MG TABLET
ORAL_TABLET | Freq: Four times a day (QID) | ORAL | 0 refills | 30.00000 days | Status: CP | PRN
Start: 2020-08-11 — End: ?

## 2020-08-12 NOTE — Unmapped (Signed)
I am located on site and the patient is located off-site for this visit.      I spent 25 minutes on video with the patient. I spent an additional 10 minutes on pre- and post-visit activities.     The patient was physically located in [place] or a state in which I am permitted to provide care. The patient understood that s/he may incur co-pays and cost sharing, and agreed to the telemedicine visit. The visit was completed via phone and/or video, which was appropriate and reasonable under the circumstances given the patient's presentation at the time.    The patient has been advised of the potential risks and limitations of this mode of treatment (including, but not limited to, the absence of in-person examination) and has agreed to be treated using telemedicine. The patient's/patient's family's questions regarding telemedicine have been answered.     If the phone/video visit was completed in an ambulatory setting, the patient has also been advised to contact their provider???s office for worsening conditions, and seek emergency medical treatment and/or call 911 if the patient deems either necessary.      ASSESSMENT and PLAN:    1-RA, continues to do well.   2-Autoimmune neutropenia, resolved.  3-OP and compression fractures, on Forteo. He finishes Forteo in January 2022 and will have a repeat DEXA, ?Reclast.    4-Peripheral neuropathy of hands and feet. Controlled with lyrica.   5-I recommended [employer].      Continue to check MTX labs every 3 months.     Continue:    -    Pregabalin (LYRICA) 100 MG capsule; Take 1 capsule (100 mg total) by mouth Three (3) times a day for PN.  Continue:  -     syringe with needle (BD TUBERCULIN SYRINGE) 1 mL 27 x 1/2 Syrg; 1 mL by Miscellaneous route once a week.  -     methotrexate 25 mg/mL injection solution; Inject 0.8 mL (20 mg total) under the skin once a week.  -     PredniSONE (DELTASONE) 1 MG tablet; Take 1 tablet (3 mg total) by mouth daily.  - oxyCODONE-acetaminophen (PERCOCET) 7.5-325 mg per tablet; Take 1 tablet by mouth every six (6) hours as needed for pain.    Continue Forteo for a total of 24 months. He will complete forteo in mid January and will repeat BMD in mid January.       RTC in 4 months.         Cc: continues to do well and denied significant joint pain.     HPI:    The patient is a 69 y.o. 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 He is s/p two 1000 mg doses of rituximab on 03/30/19 and 04/13/19.     In terms of his Osteoporosis with several compression fractures, he will complete two years in  January of 2022 and will have a repeat BMD.     He reported that his RA continues do well and denied recent flares or current joint swelling on prednisone 3 mg/d.     He had a + cologuard test and will be getting a colonoscopy in early 2022.     ROS:  all systems were reviewed and negative except as noted in the HPI.      Immunization History   Administered Date(s) Administered   ??? COVID-19 VACC,MRNA,(PFIZER)(PF)(IM) 10/21/2019, 11/18/2019   ???  INFLUENZA TIV (TRI) 62MO+ W/ PRESERV (IM) 06/26/2011, 05/08/2012   ??? INFLUENZA TIV (TRI) PF (IM) 06/28/2011, 05/16/2015, 04/26/2017   ??? Influenza Quad Adjuvanted PF 63YRS+ 06/09/2019   ??? Influenza Vaccine Quad (IIV4 PF) 42mo+ injectable 04/26/2013, 05/11/2014, 05/16/2015, 05/09/2016   ??? Influenza Virus Vaccine, unspecified formulation 05/16/2015, 04/26/2017   ??? Influenza Whole 06/24/2007, 04/26/2008, 06/21/2009   ??? Influenza, High Dose (IIV4) 65 yrs & older 05/13/2017, 05/13/2017, 06/08/2018   ??? PNEUMOCOCCAL POLYSACCHARIDE 23 06/09/2006, 04/09/2011, 05/13/2017   ??? Pneumococcal Conjugate 13-Valent 09/12/2013, 05/11/2014   ??? Pneumococcal Conjugate, Unspecified Formulation 04/26/2017   ??? Pneumococcal, Unspecified Formulation 06/09/2006, 04/09/2011, 05/13/2017   ??? TdaP 05/08/2012        Past Medical History:   Diagnosis Date   ??? CAD (coronary artery disease)    ??? CHF (congestive heart failure) (CMS-HCC)    ??? COPD (chronic obstructive pulmonary disease) (CMS-HCC)    ??? Diabetes mellitus (CMS-HCC) 2008    Type 2   ??? DJD (degenerative joint disease)    ??? GERD (gastroesophageal reflux disease)    ??? Hyperlipidemia    ??? Hypertension    ??? MI (myocardial infarction) (CMS-HCC) 1999, 2077   ??? Osteomyelitis of foot (CMS-HCC)    ??? Osteoporosis 2012   ??? Peripheral neuropathy    ??? Pneumonia, aspiration (CMS-HCC)    ??? Psoriasis    ??? RA (rheumatoid arthritis) (CMS-HCC)    ??? Sicca (CMS-HCC)          Current Outpatient Medications:   ???  albuterol (PROVENTIL HFA;VENTOLIN HFA) 90 mcg/actuation inhaler, Inhale., Disp: , Rfl:   ???  ALPRAZolam (XANAX) 0.5 MG tablet, Take 1 tablet (0.5 mg total) by mouth Three (3) times a day as needed for sleep., Disp: 30 tablet, Rfl: 0  ???  ascorbic acid (VITAMIN C) 100 MG tablet, Take 500 mg by mouth daily. , Disp: , Rfl:   ???  aspirin (ECOTRIN) 81 MG tablet, Take 81 mg by mouth once daily., Disp: , Rfl:   ???  atorvastatin (LIPITOR) 80 MG tablet, Take 80 mg by mouth nightly., Disp: , Rfl:   ???  blood sugar diagnostic Strp, Test blood sugar no more than twice daily., Disp: , Rfl:   ???  cyanocobalamin (VITAMIN B-12) 250 MCG tablet, Take 250 mcg by mouth daily., Disp: , Rfl:   ???  dextromethorphan-guaifenesin (MUCINEX DM) 30-600 mg Tb12, Take by mouth., Disp: , Rfl:   ???  ELIQUIS 5 mg Tab, Take 5 mg by mouth Two (2) times a day., Disp: , Rfl: 0  ???  fluticasone-umeclidin-vilanter (TRELEGY ELLIPTA) 100-62.5-25 mcg inhaler, Inhale 1 puff., Disp: , Rfl:   ???  folic acid (FOLVITE) 400 MCG tablet, Take 2 tablets (800 mcg total) by mouth daily., Disp: 180 tablet, Rfl: 3  ???  furosemide (LASIX) 20 MG tablet, Take 60 mg by mouth Take as directed. 2 tabs in the am, 1 tab in the afternoon., Disp: , Rfl:   ???  hydrOXYzine (ATARAX) 25 MG tablet, Take 25 mg by mouth nightly., Disp: , Rfl:   ??? loteprednol (LOTEMAX) 0.5 % ophthalmic suspension, INSTILL 1 DROP INTO BOTH EYES MEDROL DOSE PACK SCHEDULING ONLY FOR 1 DOSE, Disp: 5 mL, Rfl: 6  ???  MELATONIN ORAL, Take 9 mg by mouth nightly. , Disp: , Rfl:   ???  metFORMIN (GLUCOPHAGE) 500 MG tablet, Take 500 mg by mouth. Use as directed, Disp: , Rfl:   ???  methotrexate 25 mg/mL injection solution, Inject 0.8 mL (20 mg  total) under the skin once a week., Disp: 10 mL, Rfl: 6  ???  metoprolol tartrate (LOPRESSOR) 25 MG tablet, Take 50 mg by mouth Two (2) times a day. , Disp: , Rfl:   ???  mucus clearing device Devi, Blow through several times, 2 or 3 sets per day as needed, Disp: , Rfl:   ???  NITROSTAT 0.4 mg SL tablet, Place 1 tablet under the tongue once as needed. , Disp: , Rfl:   ???  ONETOUCH ULTRA TEST Strp, , Disp: , Rfl: 12  ???  ONETOUCH ULTRASOFT lancets, , Disp: , Rfl: 0  ???  oxyCODONE-acetaminophen (PERCOCET) 7.5-325 mg per tablet, Take 1 tablet by mouth every six (6) hours as needed for pain., Disp: 120 tablet, Rfl: 0  ???  oxyCODONE-acetaminophen (PERCOCET) 7.5-325 mg per tablet, Take 1 tablet by mouth every six (6) hours as needed for pain., Disp: 120 tablet, Rfl: 0  ???  oxyCODONE-acetaminophen (PERCOCET) 7.5-325 mg per tablet, Take 1 tablet by mouth every six (6) hours as needed for pain., Disp: 120 tablet, Rfl: 0  ???  pantoprazole (PROTONIX) 40 MG tablet, Take 40 mg by mouth Two (2) times a day. , Disp: , Rfl:   ???  potassium chloride SA (K-DUR,KLOR-CON) 10 MEQ tablet, TAKE 4 TS PO QAM AND TK 2 TS D AT 4PM, Disp: , Rfl: 9  ???  predniSONE (DELTASONE) 1 MG tablet, Take 3 tablets (3 mg total) by mouth daily., Disp: 120 tablet, Rfl: 6  ???  predniSONE (DELTASONE) 5 MG tablet, Take prednisone 15 mg/d for one week, 10 mg/d for one week, 7.5 mg/d for one week then, stay on 5 mg/d., Disp: 90 tablet, Rfl: 3  ???  pregabalin (LYRICA) 100 MG capsule, Take 1 capsule (100 mg total) by mouth Three (3) times a day., Disp: 90 capsule, Rfl: 6  ???  RITUXIMAB IV, Infuse into a venous catheter., Disp: , Rfl:   ???  sodium chloride (OCEAN) 0.65 % nasal spray, 1 spray into each nostril., Disp: , Rfl:   ???  syringe with needle (BD TUBERCULIN SYRINGE) 1 mL 27 x 1/2 Syrg, 1 mL by Miscellaneous route once a week., Disp: 12 each, Rfl: 3  ???  tbo-filgrastim (GRANIX) 480 mcg/0.8 mL Syrg injection, Inject under the skin every seven (7) days., Disp: , Rfl:   ???  TURMERIC, BULK, MISC, 500 mg by Miscellaneous route every morning before breakfast., Disp: , Rfl:   ???  VIT A/VIT C/VIT E/ZINC/COPPER (PRESERVISION AREDS ORAL), Take 1 tablet by mouth daily., Disp: , Rfl:     PE:  Reported vital signs today:  BP: 114/76 . T: 98.5 F. P: 71. O2 sat on 3L: 95 %.   GENERAL: NAD. In good spirits.   Skin: no vasculitis lesions. No visible inflammatory lesions.  EYES: sclera non-injected. No scleromalacia.  NECK: no visible masses or LAD.   MSK: advanced RA deformtities but no swelling across the MCPs or PIPs, wrists, elbows. There is restricted ROM of the wrists but not at the elbows. Shoulders,  Restricted flexion and internal rotation. Neck, no definite restriction.

## 2020-08-15 ENCOUNTER — Other Ambulatory Visit: Payer: Self-pay | Admitting: Cardiology

## 2020-08-29 ENCOUNTER — Other Ambulatory Visit: Payer: Self-pay | Admitting: Cardiology

## 2020-09-06 ENCOUNTER — Other Ambulatory Visit: Payer: Self-pay | Admitting: Cardiology

## 2020-09-11 MED ORDER — OXYCODONE-ACETAMINOPHEN 7.5 MG-325 MG TABLET
ORAL_TABLET | Freq: Four times a day (QID) | ORAL | 0 refills | 30 days | Status: CP | PRN
Start: 2020-09-11 — End: 2021-09-11

## 2020-10-12 MED ORDER — OXYCODONE-ACETAMINOPHEN 7.5 MG-325 MG TABLET
ORAL_TABLET | Freq: Four times a day (QID) | ORAL | 0 refills | 30.00000 days | Status: CP | PRN
Start: 2020-10-12 — End: ?

## 2020-11-11 DIAGNOSIS — M059 Rheumatoid arthritis with rheumatoid factor, unspecified: Principal | ICD-10-CM

## 2020-11-11 MED ORDER — LOTEPREDNOL ETABONATE 0.5 % EYE DROPS,SUSPENSION
6 refills | 0 days
Start: 2020-11-11 — End: ?

## 2020-11-14 MED ORDER — LOTEPREDNOL ETABONATE 0.5 % EYE DROPS,SUSPENSION
OPHTHALMIC | 6 refills | 0.00000 days | Status: CP
Start: 2020-11-14 — End: ?

## 2020-11-14 NOTE — Unmapped (Signed)
Loteprednol refill  Last Visit Date: 08/11/2020  Next Visit Date: 12/22/2020

## 2020-11-24 DIAGNOSIS — M059 Rheumatoid arthritis with rheumatoid factor, unspecified: Principal | ICD-10-CM

## 2020-11-24 DIAGNOSIS — M069 Rheumatoid arthritis, unspecified: Principal | ICD-10-CM

## 2020-11-24 DIAGNOSIS — M8000XA Age-related osteoporosis with current pathological fracture, unspecified site, initial encounter for fracture: Principal | ICD-10-CM

## 2020-11-24 MED ORDER — OXYCODONE-ACETAMINOPHEN 7.5 MG-325 MG TABLET
ORAL_TABLET | Freq: Four times a day (QID) | ORAL | 0 refills | 30.00000 days | Status: CP | PRN
Start: 2020-11-24 — End: ?

## 2020-11-24 NOTE — Unmapped (Signed)
Addended by: Gerrit Halls on: 11/24/2020 03:24 PM     Modules accepted: Orders

## 2020-11-24 NOTE — Unmapped (Signed)
Percocet refill  Last Visit Date: 08/11/2020  Next Visit Date: 12/22/2020

## 2020-11-24 NOTE — Unmapped (Signed)
Reason for call:     Pt is requesting refill of Oxycodone to be sent to [facility] pharmacy    Last ov: Visit date not found  Next ov: 12/22/2020

## 2020-12-22 ENCOUNTER — Telehealth: Admit: 2020-12-22 | Discharge: 2020-12-23 | Payer: MEDICARE | Attending: Rheumatology | Primary: Rheumatology

## 2020-12-22 MED ORDER — BD TUBERCULIN SYRINGE 1 ML 27 X 1/2"
3 refills | 0 days
Start: 2020-12-22 — End: ?

## 2020-12-22 MED ORDER — OXYCODONE-ACETAMINOPHEN 7.5 MG-325 MG TABLET
ORAL_TABLET | Freq: Four times a day (QID) | ORAL | 0 refills | 30.00000 days | PRN
Start: 2020-12-22 — End: 2021-12-22

## 2020-12-22 MED ORDER — PREDNISONE 1 MG TABLET
ORAL_TABLET | Freq: Every day | ORAL | 6 refills | 40.00000 days
Start: 2020-12-22 — End: 2021-12-22

## 2020-12-22 NOTE — Unmapped (Signed)
I am located on site and the patient is located off-site for this visit.      I spent 25 minutes on video with the patient. I spent an additional 10 minutes on pre- and post-visit activities.     The patient was physically located in [place] or a state in which I am permitted to provide care. The patient understood that s/he may incur co-pays and cost sharing, and agreed to the telemedicine visit. The visit was completed via phone and/or video, which was appropriate and reasonable under the circumstances given the patient's presentation at the time.    The patient has been advised of the potential risks and limitations of this mode of treatment (including, but not limited to, the absence of in-person examination) and has agreed to be treated using telemedicine. The patient's/patient's family's questions regarding telemedicine have been answered.     If the phone/video visit was completed in an ambulatory setting, the patient has also been advised to contact their provider???s office for worsening conditions, and seek emergency medical treatment and/or call 911 if the patient deems either necessary.      ASSESSMENT and PLAN:    1-RA, with recent flare on 3 mg/d of prednisone. Controlled now that he is back on 5 mg/d. We will continue this dose for now and try 4 mg/d later in the year.   2-Autoimmune neutropenia, resolved.  3-OP and compression fractures. Completed two years of Forteo in January of 2022. His spine specialist is considering  Reclast.    4-Peripheral neuropathy of hands and feet. Controlled with lyrica.   5-I recommended Evusheld that he will get locally. He's had 5 vaccines.     Continue to check MTX labs every 3 months.     Continue:    -    Pregabalin (LYRICA) 100 MG capsule; Take 1 capsule (100 mg total) by mouth Three (3) times a day for PN.  Continue:  -     syringe with needle (BD TUBERCULIN SYRINGE) 1 mL 27 x 1/2 Syrg; 1 mL by Miscellaneous route once a week.  -     methotrexate 25 mg/mL injection solution; Inject 0.8 mL (20 mg total) under the skin once a week.  -     PredniSONE (DELTASONE) 5 mg by mouth daily.  -     oxyCODONE-acetaminophen (PERCOCET) 7.5-325 mg per tablet; Take 1 tablet by mouth every six (6) hours as needed for pain.           RTC in 4 months.         Cc: No pain or fatigue today.     HPI:    The patient is a 70 y.o. 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 He is s/p two 1000 mg doses of rituximab on 03/30/19 and 04/13/19.     In the interim, he contacted me 10 days ago with a flare of his RA involving his hands, wrists, knees and shoulders. The flare resolved after 3 days after we increased his prednisone dose to 5 mg/d.  Today he denied joint pain, swelling or morning stiffness. No fatigue.     He had a + cologuard test and will be getting a colonoscopy in May 2022.     ROS:  all systems were reviewed and negative except as noted in the HPI.      Immunization History   Administered Date(s) Administered   ???  COVID-19 VACC,MRNA,(PFIZER)(PF)(IM) 10/21/2019, 11/18/2019   ??? COVID-19 VACCINE,MRNA(MODERNA)(PF)(IM) 05/11/2020, 08/12/2020   ??? INFLUENZA TIV (TRI) 57MO+ W/ PRESERV (IM) 06/26/2011, 05/08/2012   ??? INFLUENZA TIV (TRI) PF (IM) 06/28/2011, 05/16/2015, 04/26/2017   ??? Influenza Quad Adjuvanted PF 38YRS+ 06/09/2019   ??? Influenza Vaccine Quad (IIV4 PF) 104mo+ injectable 04/26/2013, 05/11/2014, 05/16/2015, 05/09/2016   ??? Influenza Virus Vaccine, unspecified formulation 05/16/2015, 04/26/2017   ??? Influenza Whole 06/24/2007, 04/26/2008, 06/21/2009   ??? Influenza, High Dose (IIV4) 65 yrs & older 05/13/2017, 05/13/2017, 06/08/2018   ??? PNEUMOCOCCAL POLYSACCHARIDE 23 06/09/2006, 04/09/2011, 05/13/2017   ??? Pneumococcal Conjugate 13-Valent 09/12/2013, 05/11/2014   ??? Pneumococcal Conjugate, Unspecified Formulation 04/26/2017   ??? Pneumococcal, Unspecified Formulation 06/09/2006, 04/09/2011, 05/13/2017   ??? TdaP 05/08/2012        Past Medical History:   Diagnosis Date   ??? CAD (coronary artery disease)    ??? CHF (congestive heart failure) (CMS-HCC)    ??? COPD (chronic obstructive pulmonary disease) (CMS-HCC)    ??? Diabetes mellitus (CMS-HCC) 2008    Type 2   ??? DJD (degenerative joint disease)    ??? GERD (gastroesophageal reflux disease)    ??? Hyperlipidemia    ??? Hypertension    ??? MI (myocardial infarction) (CMS-HCC) 1999, 2077   ??? Osteomyelitis of foot (CMS-HCC)    ??? Osteoporosis 2012   ??? Peripheral neuropathy    ??? Pneumonia, aspiration (CMS-HCC)    ??? Psoriasis    ??? RA (rheumatoid arthritis) (CMS-HCC)    ??? Sicca (CMS-HCC)          Current Outpatient Medications:   ???  albuterol (PROVENTIL HFA;VENTOLIN HFA) 90 mcg/actuation inhaler, Inhale., Disp: , Rfl:   ???  ALPRAZolam (XANAX) 0.5 MG tablet, Take 1 tablet (0.5 mg total) by mouth Three (3) times a day as needed for sleep., Disp: 30 tablet, Rfl: 0  ???  ascorbic acid (VITAMIN C) 100 MG tablet, Take 500 mg by mouth daily. , Disp: , Rfl:   ???  aspirin (ECOTRIN) 81 MG tablet, Take 81 mg by mouth once daily., Disp: , Rfl:   ???  atorvastatin (LIPITOR) 80 MG tablet, Take 80 mg by mouth nightly., Disp: , Rfl:   ???  blood sugar diagnostic Strp, Test blood sugar no more than twice daily., Disp: , Rfl:   ???  cyanocobalamin (VITAMIN B-12) 250 MCG tablet, Take 250 mcg by mouth daily., Disp: , Rfl:   ???  dextromethorphan-guaifenesin (MUCINEX DM) 30-600 mg Tb12, Take by mouth., Disp: , Rfl:   ???  ELIQUIS 5 mg Tab, Take 5 mg by mouth Two (2) times a day., Disp: , Rfl: 0  ???  fluticasone-umeclidin-vilanter (TRELEGY ELLIPTA) 100-62.5-25 mcg inhaler, Inhale 1 puff., Disp: , Rfl:   ???  folic acid (FOLVITE) 400 MCG tablet, Take 2 tablets (800 mcg total) by mouth daily., Disp: 180 tablet, Rfl: 3  ???  furosemide (LASIX) 20 MG tablet, Take 60 mg by mouth Take as directed. 2 tabs in the am, 1 tab in the afternoon., Disp: , Rfl:   ???  hydrOXYzine (ATARAX) 25 MG tablet, Take 25 mg by mouth nightly., Disp: , Rfl:   ???  loteprednol (LOTEMAX) 0.5 % ophthalmic suspension, INSTILL 1 DROP INTO BOTH EYES (MEDROL DOSE PACK SCHEDULING ONLY FOR 1 DOSE), Disp: 5 mL, Rfl: 6  ???  MELATONIN ORAL, Take 9 mg by mouth nightly. , Disp: , Rfl:   ???  metFORMIN (GLUCOPHAGE) 500 MG tablet, Take 500 mg by mouth. Use as directed, Disp: ,  Rfl:   ???  methotrexate 25 mg/mL injection solution, Inject 0.8 mL (20 mg total) under the skin once a week., Disp: 10 mL, Rfl: 6  ???  metoprolol tartrate (LOPRESSOR) 25 MG tablet, Take 50 mg by mouth Two (2) times a day. , Disp: , Rfl:   ???  mucus clearing device Devi, Blow through several times, 2 or 3 sets per day as needed, Disp: , Rfl:   ???  NITROSTAT 0.4 mg SL tablet, Place 1 tablet under the tongue once as needed. , Disp: , Rfl:   ???  ONETOUCH ULTRA TEST Strp, , Disp: , Rfl: 12  ???  ONETOUCH ULTRASOFT lancets, , Disp: , Rfl: 0  ???  oxyCODONE-acetaminophen (PERCOCET) 7.5-325 mg per tablet, Take 1 tablet by mouth every six (6) hours as needed for pain., Disp: 120 tablet, Rfl: 0  ???  oxyCODONE-acetaminophen (PERCOCET) 7.5-325 mg per tablet, Take 1 tablet by mouth every six (6) hours as needed for pain., Disp: 120 tablet, Rfl: 0  ???  oxyCODONE-acetaminophen (PERCOCET) 7.5-325 mg per tablet, Take 1 tablet by mouth every six (6) hours as needed for pain., Disp: 120 tablet, Rfl: 0  ???  pantoprazole (PROTONIX) 40 MG tablet, Take 40 mg by mouth Two (2) times a day. , Disp: , Rfl:   ???  potassium chloride SA (K-DUR,KLOR-CON) 10 MEQ tablet, TAKE 4 TS PO QAM AND TK 2 TS D AT 4PM, Disp: , Rfl: 9  ???  predniSONE (DELTASONE) 1 MG tablet, Take 3 tablets (3 mg total) by mouth daily., Disp: 120 tablet, Rfl: 6  ???  predniSONE (DELTASONE) 5 MG tablet, Take prednisone 15 mg/d for one week, 10 mg/d for one week, 7.5 mg/d for one week then, stay on 5 mg/d., Disp: 90 tablet, Rfl: 3  ???  pregabalin (LYRICA) 100 MG capsule, Take 1 capsule (100 mg total) by mouth Three (3) times a day., Disp: 90 capsule, Rfl: 6  ???  RITUXIMAB IV, Infuse into a venous catheter., Disp: , Rfl:   ???  sodium chloride (OCEAN) 0.65 % nasal spray, 1 spray into each nostril., Disp: , Rfl:   ???  syringe (BD TUBERCULIN SYRINGE) 1 mL 27 x 1/2 Syrg, 1 mL by Miscellaneous route once a week., Disp: 12 each, Rfl: 3  ???  tbo-filgrastim (GRANIX) 480 mcg/0.8 mL Syrg injection, Inject under the skin every seven (7) days., Disp: , Rfl:   ???  TURMERIC, BULK, MISC, 500 mg by Miscellaneous route every morning before breakfast., Disp: , Rfl:   ???  VIT A/VIT C/VIT E/ZINC/COPPER (PRESERVISION AREDS ORAL), Take 1 tablet by mouth daily., Disp: , Rfl:     PE:  Reported vital signs today done by patient at home:  BP: 114/76 . T: 98.5 F. P: 71. O2 sat on 3L: 95 %.   GENERAL: NAD. In good spirits.   Skin: no vasculitis lesions. No visible inflammatory lesions.  EYES: sclera non-injected. No scleromalacia.  NECK: no visible masses or LAD.   MSK: advanced RA deformtities but no swelling across the MCPs or PIPs, wrists, elbows. There is restricted ROM of the wrists but not at the elbows. Shoulders,  Restricted flexion and internal rotation. Neck, no definite restriction.

## 2020-12-25 MED ORDER — PREDNISONE 5 MG TABLET
ORAL_TABLET | Freq: Every day | ORAL | 3 refills | 90 days | Status: CP
Start: 2020-12-25 — End: ?

## 2020-12-25 MED ORDER — METHOTREXATE SODIUM 25 MG/ML INJECTION SOLUTION
SUBCUTANEOUS | 6 refills | 84 days | Status: CP
Start: 2020-12-25 — End: ?

## 2020-12-25 MED ORDER — BD TUBERCULIN SYRINGE 1 ML 27 X 1/2"
3 refills | 0 days | Status: CP
Start: 2020-12-25 — End: ?

## 2020-12-25 MED ORDER — PREDNISONE 1 MG TABLET
ORAL_TABLET | Freq: Every day | ORAL | 6 refills | 40.00000 days | Status: CP
Start: 2020-12-25 — End: 2021-12-25

## 2020-12-25 MED ORDER — PREGABALIN 100 MG CAPSULE
ORAL_CAPSULE | Freq: Three times a day (TID) | ORAL | 6 refills | 30.00000 days | Status: CP
Start: 2020-12-25 — End: 2021-12-25

## 2021-01-09 DIAGNOSIS — E1142 Type 2 diabetes mellitus with diabetic polyneuropathy: Principal | ICD-10-CM

## 2021-01-09 DIAGNOSIS — M153 Secondary multiple arthritis: Principal | ICD-10-CM

## 2021-01-09 MED ORDER — LIDOCAINE 5 % TOPICAL PATCH
MEDICATED_PATCH | Freq: Two times a day (BID) | TRANSDERMAL | 6 refills | 15.00000 days | Status: CP
Start: 2021-01-09 — End: 2022-01-09

## 2021-01-25 MED ORDER — OXYCODONE-ACETAMINOPHEN 7.5 MG-325 MG TABLET
ORAL_TABLET | Freq: Four times a day (QID) | ORAL | 0 refills | 30 days | Status: CP | PRN
Start: 2021-01-25 — End: ?

## 2021-02-19 DIAGNOSIS — M059 Rheumatoid arthritis with rheumatoid factor, unspecified: Principal | ICD-10-CM

## 2021-02-19 DIAGNOSIS — M8000XA Age-related osteoporosis with current pathological fracture, unspecified site, initial encounter for fracture: Principal | ICD-10-CM

## 2021-02-19 DIAGNOSIS — M069 Rheumatoid arthritis, unspecified: Principal | ICD-10-CM

## 2021-02-19 MED ORDER — PREGABALIN 100 MG CAPSULE
ORAL_CAPSULE | 5 refills | 0.00000 days
Start: 2021-02-19 — End: ?

## 2021-02-20 MED ORDER — PREGABALIN 100 MG CAPSULE
ORAL_CAPSULE | 5 refills | 0 days | Status: CP
Start: 2021-02-20 — End: ?

## 2021-02-21 DIAGNOSIS — R768 Other specified abnormal immunological findings in serum: Principal | ICD-10-CM

## 2021-02-21 DIAGNOSIS — M059 Rheumatoid arthritis with rheumatoid factor, unspecified: Principal | ICD-10-CM

## 2021-02-21 DIAGNOSIS — M05742 Rheumatoid arthritis with rheumatoid factor of left hand without organ or systems involvement: Principal | ICD-10-CM

## 2021-02-21 DIAGNOSIS — M05741 Rheumatoid arthritis with rheumatoid factor of right hand without organ or systems involvement: Principal | ICD-10-CM

## 2021-02-21 DIAGNOSIS — S22080D Wedge compression fracture of T11-T12 vertebra, subsequent encounter for fracture with routine healing: Principal | ICD-10-CM

## 2021-02-21 DIAGNOSIS — M8000XA Age-related osteoporosis with current pathological fracture, unspecified site, initial encounter for fracture: Principal | ICD-10-CM

## 2021-02-21 DIAGNOSIS — M069 Rheumatoid arthritis, unspecified: Principal | ICD-10-CM

## 2021-02-21 NOTE — Unmapped (Signed)
Narcotic refill  Last Visit Date: 12/22/2020  Next Visit Date: Visit date not found    Please advise on next appointment as well.      Thanks!    Tiff

## 2021-02-24 MED ORDER — OXYCODONE-ACETAMINOPHEN 7.5 MG-325 MG TABLET
ORAL_TABLET | Freq: Four times a day (QID) | ORAL | 0 refills | 30.00000 days | Status: CP | PRN
Start: 2021-02-24 — End: 2022-02-24

## 2021-03-20 DIAGNOSIS — S22080D Wedge compression fracture of T11-T12 vertebra, subsequent encounter for fracture with routine healing: Principal | ICD-10-CM

## 2021-03-27 MED ORDER — OXYCODONE-ACETAMINOPHEN 7.5 MG-325 MG TABLET
ORAL_TABLET | Freq: Four times a day (QID) | ORAL | 0 refills | 30 days | Status: CP | PRN
Start: 2021-03-27 — End: 2022-03-27

## 2021-04-17 MED ORDER — SODIUM CHLORIDE 3 % FOR NEBULIZATION
RESPIRATORY_TRACT | 3 refills | 0 days
Start: 2021-04-17 — End: ?

## 2021-04-19 MED FILL — SODIUM CHLORIDE 3 % FOR NEBULIZATION: RESPIRATORY_TRACT | 90 days supply | Qty: 720 | Fill #0

## 2021-04-27 MED ORDER — OXYCODONE-ACETAMINOPHEN 7.5 MG-325 MG TABLET
ORAL_TABLET | Freq: Four times a day (QID) | ORAL | 0 refills | 30.00000 days | Status: CP | PRN
Start: 2021-04-27 — End: ?

## 2021-05-11 ENCOUNTER — Telehealth: Admit: 2021-05-11 | Discharge: 2021-05-12 | Payer: MEDICARE | Attending: Rheumatology | Primary: Rheumatology

## 2021-05-11 DIAGNOSIS — M8000XA Age-related osteoporosis with current pathological fracture, unspecified site, initial encounter for fracture: Principal | ICD-10-CM

## 2021-05-11 DIAGNOSIS — M05742 Rheumatoid arthritis with rheumatoid factor of left hand without organ or systems involvement: Principal | ICD-10-CM

## 2021-05-11 DIAGNOSIS — D708 Other neutropenia: Principal | ICD-10-CM

## 2021-05-11 DIAGNOSIS — M059 Rheumatoid arthritis with rheumatoid factor, unspecified: Principal | ICD-10-CM

## 2021-05-11 DIAGNOSIS — M069 Rheumatoid arthritis, unspecified: Principal | ICD-10-CM

## 2021-05-11 DIAGNOSIS — M05741 Rheumatoid arthritis with rheumatoid factor of right hand without organ or systems involvement: Principal | ICD-10-CM

## 2021-05-11 DIAGNOSIS — J449 Chronic obstructive pulmonary disease, unspecified: Principal | ICD-10-CM

## 2021-05-11 DIAGNOSIS — S22080D Wedge compression fracture of T11-T12 vertebra, subsequent encounter for fracture with routine healing: Principal | ICD-10-CM

## 2021-05-11 MED ORDER — METHOTREXATE SODIUM 25 MG/ML INJECTION SOLUTION
SUBCUTANEOUS | 6 refills | 84 days | Status: CP
Start: 2021-05-11 — End: ?

## 2021-05-11 MED ORDER — BD TUBERCULIN SYRINGE 1 ML 27 X 1/2"
3 refills | 0 days | Status: CP
Start: 2021-05-11 — End: ?

## 2021-05-11 MED ORDER — PREGABALIN 100 MG CAPSULE
ORAL_CAPSULE | Freq: Three times a day (TID) | ORAL | 5 refills | 30 days | Status: CP
Start: 2021-05-11 — End: ?

## 2021-05-11 MED ORDER — PREDNISONE 5 MG TABLET
ORAL_TABLET | Freq: Every day | ORAL | 3 refills | 90.00000 days | Status: CP
Start: 2021-05-11 — End: ?

## 2021-05-11 NOTE — Unmapped (Signed)
I am located on site and the patient is located off-site for this visit.      I spent 29 minutes on video with the patient. I spent an additional 15 minutes on pre- and post-visit activities.     The patient was physically located in [place] or a state in which I am permitted to provide care. The patient understood that s/he may incur co-pays and cost sharing, and agreed to the telemedicine visit. The visit was completed via phone and/or video, which was appropriate and reasonable under the circumstances given the patient's presentation at the time.    The patient has been advised of the potential risks and limitations of this mode of treatment (including, but not limited to, the absence of in-person examination) and has agreed to be treated using telemedicine. The patient's/patient's family's questions regarding telemedicine have been answered.     If the phone/video visit was completed in an ambulatory setting, the patient has also been advised to contact their provider???s office for worsening conditions, and seek emergency medical treatment and/or call 911 if the patient deems either necessary.      ASSESSMENT and PLAN:    1-RA, well controlled on current regimen.   2-Autoimmune neutropenia, resolved.  3-OP and compression fractures. Completed two years of Forteo in January of 2022. Now on alendronate.     4-Peripheral neuropathy of hands and feet. Controlled with lyrica.   5- S/p Evusheld on 01/16/21 and will get every 6 months. He's had 5 vaccines.   6-Recent admission for RUL aspiration pneumonia due to H. Flu and sepsis with hypotension. Recovered.     Continue to check MTX labs every 3 months.     Continue:    -    Pregabalin (LYRICA) 100 MG capsule; Take 1 capsule (100 mg total) by mouth Three (3) times a day for PN.  Continue:  -     syringe with needle (BD TUBERCULIN SYRINGE) 1 mL 27 x 1/2 Syrg; 1 mL by Miscellaneous route once a week.  -     methotrexate 25 mg/mL injection solution; Inject 0.8 mL (20 mg total) under the skin once a week.  -     PredniSONE (DELTASONE) 5 mg by mouth daily.  -     oxyCODONE-acetaminophen (PERCOCET) 7.5-325 mg per tablet; Take 1 tablet by mouth every six (6) hours as needed for pain.     RTC in 4 months.         Cc: no joint pain. Recent admission for aspiration pneumonia and sepsis.     HPI:    The patient is a 70 y.o. 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 He is s/p two 1000 mg doses of rituximab on 03/30/19 and 04/13/19. He has severe COPD and was admitted to a local hospital [facility] on 03/27/21 with aspiration RUL pneumonia due to H. Flu and sepsis. This was preceded by n/v the night before after eating a meal.  He did not require intubation. He as now updated his advanced directives. He has not had neutropenia.     Today he reported that he is fully recovered from the pneumonia and denied fever, chills, cough or dyspnea.  In terms of his arthritis, he denied joint pain or swelling on  5 mg/d of prednisone and MTX 20 mg/week. Last labs were all stable and without neutropenia.      ROS:  all systems  were reviewed and negative except as noted in the HPI.    Immunization History   Administered Date(s) Administered   ??? COVID-19 VACC,MRNA,(PFIZER)(PF)(IM) 10/21/2019, 11/18/2019   ??? COVID-19 VACCINE,MRNA(MODERNA)(PF)(IM) 04/12/2020, 05/11/2020, 08/12/2020   ??? INFLUENZA QUAD ADJUVANTED 28YR UP(FLUAD) 06/09/2019   ??? INFLUENZA QUAD HIGH DOSE 28YRS+(FLUZONE) 05/13/2017, 05/13/2017, 06/08/2018   ??? INFLUENZA TIV (TRI) 26MO+ W/ PRESERV (IM) 06/26/2011, 05/08/2012   ??? INFLUENZA TIV (TRI) PF (IM) 06/28/2011, 05/16/2015, 04/26/2017   ??? Influenza Vaccine Quad (IIV4 PF) 48mo+ injectable 04/26/2013, 05/11/2014, 05/16/2015, 05/09/2016   ??? Influenza Virus Vaccine, unspecified formulation 05/16/2015, 04/26/2017   ??? Influenza Whole 06/24/2007, 04/26/2008, 06/21/2009   ??? PNEUMOCOCCAL POLYSACCHARIDE 23 06/09/2006, 04/09/2011, 05/13/2017   ??? Pneumococcal Conjugate 13-Valent 09/12/2013, 05/11/2014   ??? Pneumococcal Conjugate, Unspecified Formulation 04/26/2017   ??? Pneumococcal, Unspecified Formulation 06/09/2006, 04/09/2011, 05/13/2017   ??? TdaP 05/08/2012        Past Medical History:   Diagnosis Date   ??? CAD (coronary artery disease)    ??? CHF (congestive heart failure) (CMS-HCC)    ??? COPD (chronic obstructive pulmonary disease) (CMS-HCC)    ??? Diabetes mellitus (CMS-HCC) 2008    Type 2   ??? DJD (degenerative joint disease)    ??? GERD (gastroesophageal reflux disease)    ??? Hyperlipidemia    ??? Hypertension    ??? MI (myocardial infarction) (CMS-HCC) 1999, 2077   ??? Osteomyelitis of foot (CMS-HCC)    ??? Osteoporosis 2012   ??? Peripheral neuropathy    ??? Pneumonia, aspiration (CMS-HCC)    ??? Psoriasis    ??? RA (rheumatoid arthritis) (CMS-HCC)    ??? Sicca (CMS-HCC)          Current Outpatient Medications:   ???  albuterol (PROVENTIL HFA;VENTOLIN HFA) 90 mcg/actuation inhaler, Inhale., Disp: , Rfl:   ???  ALPRAZolam (XANAX) 0.5 MG tablet, Take 1 tablet (0.5 mg total) by mouth Three (3) times a day as needed for sleep., Disp: 30 tablet, Rfl: 0  ???  ascorbic acid (VITAMIN C) 100 MG tablet, Take 500 mg by mouth daily. , Disp: , Rfl:   ???  aspirin (ECOTRIN) 81 MG tablet, Take 81 mg by mouth once daily., Disp: , Rfl:   ???  atorvastatin (LIPITOR) 80 MG tablet, Take 80 mg by mouth nightly., Disp: , Rfl:   ???  blood sugar diagnostic Strp, Test blood sugar no more than twice daily., Disp: , Rfl:   ???  cyanocobalamin (VITAMIN B-12) 250 MCG tablet, Take 250 mcg by mouth daily., Disp: , Rfl:   ???  dextromethorphan-guaifenesin (MUCINEX DM) 30-600 mg Tb12, Take by mouth., Disp: , Rfl:   ???  ELIQUIS 5 mg Tab, Take 5 mg by mouth Two (2) times a day., Disp: , Rfl: 0  ???  fluticasone-umeclidin-vilanter (TRELEGY ELLIPTA) 100-62.5-25 mcg inhaler, Inhale 1 puff., Disp: , Rfl:   ???  folic acid (FOLVITE) 400 MCG tablet, Take 2 tablets (800 mcg total) by mouth daily., Disp: 180 tablet, Rfl: 3  ???  furosemide (LASIX) 20 MG tablet, Take 60 mg by mouth Take as directed. 2 tabs in the am, 1 tab in the afternoon., Disp: , Rfl:   ???  hydrOXYzine (ATARAX) 25 MG tablet, Take 25 mg by mouth nightly., Disp: , Rfl:   ???  lidocaine (LIDODERM) 5 % patch, Place 1 patch on the skin every twelve (12) hours. Apply to affected area for 12 hours only each day (then remove patch), Disp: 30 patch, Rfl: 6  ???  loteprednol (LOTEMAX)  0.5 % ophthalmic suspension, INSTILL 1 DROP INTO BOTH EYES (MEDROL DOSE PACK SCHEDULING ONLY FOR 1 DOSE), Disp: 5 mL, Rfl: 6  ???  MELATONIN ORAL, Take 9 mg by mouth nightly. , Disp: , Rfl:   ???  metFORMIN (GLUCOPHAGE) 500 MG tablet, Take 500 mg by mouth. Use as directed, Disp: , Rfl:   ???  methotrexate 25 mg/mL injection solution, Inject 0.8 mL (20 mg total) under the skin once a week., Disp: 10 mL, Rfl: 6  ???  metoprolol tartrate (LOPRESSOR) 25 MG tablet, Take 50 mg by mouth Two (2) times a day. , Disp: , Rfl:   ???  mucus clearing device Devi, Blow through several times, 2 or 3 sets per day as needed, Disp: , Rfl:   ???  NITROSTAT 0.4 mg SL tablet, Place 1 tablet under the tongue once as needed. , Disp: , Rfl:   ???  ONETOUCH ULTRA TEST Strp, , Disp: , Rfl: 12  ???  ONETOUCH ULTRASOFT lancets, , Disp: , Rfl: 0  ???  oxyCODONE-acetaminophen (PERCOCET) 7.5-325 mg per tablet, Take 1 tablet by mouth every six (6) hours as needed for pain., Disp: 120 tablet, Rfl: 0  ???  oxyCODONE-acetaminophen (PERCOCET) 7.5-325 mg per tablet, Take 1 tablet by mouth every six (6) hours as needed for pain., Disp: 120 tablet, Rfl: 0  ???  [START ON 05/27/2021] oxyCODONE-acetaminophen (PERCOCET) 7.5-325 mg per tablet, Take 1 tablet by mouth every six (6) hours as needed for pain., Disp: 120 tablet, Rfl: 0  ???  pantoprazole (PROTONIX) 40 MG tablet, Take 40 mg by mouth Two (2) times a day. , Disp: , Rfl:   ???  potassium chloride SA (K-DUR,KLOR-CON) 10 MEQ tablet, TAKE 4 TS PO QAM AND TK 2 TS D AT 4PM, Disp: , Rfl: 9  ???  predniSONE (DELTASONE) 1 MG tablet, Take 3 tablets (3 mg total) by mouth daily., Disp: 120 tablet, Rfl: 6  ???  predniSONE (DELTASONE) 5 MG tablet, Take 1 tablet (5 mg total) by mouth daily., Disp: 90 tablet, Rfl: 3  ???  pregabalin (LYRICA) 100 MG capsule, TAKE ONE CAPSULE BY MOUTH THREE TIMES A DAY, Disp: 90 capsule, Rfl: 5  ???  RITUXIMAB IV, Infuse into a venous catheter., Disp: , Rfl:   ???  sodium chloride (OCEAN) 0.65 % nasal spray, 1 spray into each nostril., Disp: , Rfl:   ???  sodium chloride 3 % nebulizer solution, Take 4 mLs by nebulization 2 (two) times a day as needed for Cough (Productive cough)., Disp: 750 mL, Rfl: 3  ???  syringe (BD TUBERCULIN SYRINGE) 1 mL 27 x 1/2 Syrg, 1 mL by Miscellaneous route once a week., Disp: 12 each, Rfl: 3  ???  tbo-filgrastim (GRANIX) 480 mcg/0.8 mL Syrg injection, Inject under the skin every seven (7) days., Disp: , Rfl:   ???  TURMERIC, BULK, MISC, 500 mg by Miscellaneous route every morning before breakfast., Disp: , Rfl:   ???  VIT A/VIT C/VIT E/ZINC/COPPER (PRESERVISION AREDS ORAL), Take 1 tablet by mouth daily., Disp: , Rfl:     PE:  Reported vital signs today done by patient at home:  BP: 109/73 . T: 97.5  F. P:64. O2 sat on 3L: 97 %.   GENERAL: NAD. In good spirits.   Skin: no vasculitis lesions. No visible inflammatory lesions.  EYES: sclera non-injected. No scleromalacia.  NECK: no visible masses or LAD.   MSK: advanced RA deformtities but no swelling across the MCPs or PIPs, wrists, elbows.  There is restricted ROM of the wrists but not at the elbows. Shoulders,  Restricted flexion and internal rotation. Neck, no definite restriction.

## 2021-05-27 MED ORDER — OXYCODONE-ACETAMINOPHEN 7.5 MG-325 MG TABLET
ORAL_TABLET | Freq: Four times a day (QID) | ORAL | 0 refills | 30.00000 days | Status: CP | PRN
Start: 2021-05-27 — End: ?

## 2021-06-10 MED ORDER — OXYCODONE-ACETAMINOPHEN 7.5 MG-325 MG TABLET
ORAL_TABLET | Freq: Four times a day (QID) | ORAL | 0 refills | 30.00000 days | Status: CP | PRN
Start: 2021-06-10 — End: 2022-06-10

## 2021-07-11 MED ORDER — OXYCODONE-ACETAMINOPHEN 7.5 MG-325 MG TABLET
ORAL_TABLET | Freq: Four times a day (QID) | ORAL | 0 refills | 30.00000 days | Status: CP | PRN
Start: 2021-07-11 — End: ?

## 2021-08-10 MED ORDER — OXYCODONE-ACETAMINOPHEN 7.5 MG-325 MG TABLET
ORAL_TABLET | Freq: Four times a day (QID) | ORAL | 0 refills | 30 days | Status: CP | PRN
Start: 2021-08-10 — End: ?

## 2021-08-31 DIAGNOSIS — M8000XA Age-related osteoporosis with current pathological fracture, unspecified site, initial encounter for fracture: Principal | ICD-10-CM

## 2021-08-31 DIAGNOSIS — M069 Rheumatoid arthritis, unspecified: Principal | ICD-10-CM

## 2021-08-31 DIAGNOSIS — S22080D Wedge compression fracture of T11-T12 vertebra, subsequent encounter for fracture with routine healing: Principal | ICD-10-CM

## 2021-08-31 DIAGNOSIS — M05741 Rheumatoid arthritis with rheumatoid factor of right hand without organ or systems involvement: Principal | ICD-10-CM

## 2021-08-31 DIAGNOSIS — M05742 Rheumatoid arthritis with rheumatoid factor of left hand without organ or systems involvement: Principal | ICD-10-CM

## 2021-08-31 DIAGNOSIS — M059 Rheumatoid arthritis with rheumatoid factor, unspecified: Principal | ICD-10-CM

## 2021-08-31 MED ORDER — METHOTREXATE SODIUM 25 MG/ML INJECTION SOLUTION
SUBCUTANEOUS | 6 refills | 84.00000 days | Status: CP
Start: 2021-08-31 — End: ?

## 2021-08-31 MED ORDER — PREDNISONE 5 MG TABLET
ORAL_TABLET | Freq: Every day | ORAL | 3 refills | 90 days | Status: CP
Start: 2021-08-31 — End: ?

## 2021-08-31 MED ORDER — PREGABALIN 100 MG CAPSULE
ORAL_CAPSULE | Freq: Three times a day (TID) | ORAL | 5 refills | 30 days | Status: CP
Start: 2021-08-31 — End: ?

## 2021-09-10 MED ORDER — OXYCODONE-ACETAMINOPHEN 7.5 MG-325 MG TABLET
ORAL_TABLET | Freq: Four times a day (QID) | ORAL | 0 refills | 30 days | Status: CP | PRN
Start: 2021-09-10 — End: 2022-09-10

## 2021-09-12 DIAGNOSIS — M059 Rheumatoid arthritis with rheumatoid factor, unspecified: Principal | ICD-10-CM

## 2021-09-12 MED ORDER — PREDNISONE 10 MG TABLET
ORAL_TABLET | 2 refills | 0 days | Status: CP
Start: 2021-09-12 — End: ?

## 2021-10-11 MED ORDER — OXYCODONE-ACETAMINOPHEN 7.5 MG-325 MG TABLET
ORAL_TABLET | Freq: Four times a day (QID) | ORAL | 0 refills | 30.00000 days | Status: CP | PRN
Start: 2021-10-11 — End: ?

## 2021-10-12 ENCOUNTER — Telehealth: Admit: 2021-10-12 | Discharge: 2021-10-13 | Payer: MEDICARE | Attending: Rheumatology | Primary: Rheumatology

## 2021-10-12 DIAGNOSIS — D708 Other neutropenia: Principal | ICD-10-CM

## 2021-10-12 DIAGNOSIS — S22080D Wedge compression fracture of T11-T12 vertebra, subsequent encounter for fracture with routine healing: Principal | ICD-10-CM

## 2021-10-12 DIAGNOSIS — R768 Other specified abnormal immunological findings in serum: Principal | ICD-10-CM

## 2021-10-12 DIAGNOSIS — M8000XA Age-related osteoporosis with current pathological fracture, unspecified site, initial encounter for fracture: Principal | ICD-10-CM

## 2021-10-12 DIAGNOSIS — J449 Chronic obstructive pulmonary disease, unspecified: Principal | ICD-10-CM

## 2021-10-12 DIAGNOSIS — M05742 Rheumatoid arthritis with rheumatoid factor of left hand without organ or systems involvement: Principal | ICD-10-CM

## 2021-10-12 DIAGNOSIS — M05741 Rheumatoid arthritis with rheumatoid factor of right hand without organ or systems involvement: Principal | ICD-10-CM

## 2021-10-12 DIAGNOSIS — M059 Rheumatoid arthritis with rheumatoid factor, unspecified: Principal | ICD-10-CM

## 2021-10-12 DIAGNOSIS — M069 Rheumatoid arthritis, unspecified: Principal | ICD-10-CM

## 2021-10-12 MED ORDER — METHOTREXATE SODIUM 25 MG/ML INJECTION SOLUTION
SUBCUTANEOUS | 6 refills | 84.00000 days | Status: CP
Start: 2021-10-12 — End: ?

## 2021-10-12 MED ORDER — PREDNISONE 5 MG TABLET
ORAL_TABLET | Freq: Every day | ORAL | 3 refills | 90 days | Status: CP
Start: 2021-10-12 — End: ?

## 2021-10-12 MED ORDER — PREDNISONE 10 MG TABLET
ORAL_TABLET | Freq: Every day | ORAL | 5 refills | 30 days | Status: CP
Start: 2021-10-12 — End: ?

## 2021-10-12 NOTE — Unmapped (Signed)
I am located on site and the patient is located off-site for this visit.      I spent 21 minutes on video with the patient. I spent an additional 15 minutes on pre- and post-visit activities.     The patient was physically located in [place] or a state in which I am permitted to provide care. The patient understood that s/he may incur co-pays and cost sharing, and agreed to the telemedicine visit. The visit was completed via phone and/or video, which was appropriate and reasonable under the circumstances given the patient's presentation at the time.    The patient has been advised of the potential risks and limitations of this mode of treatment (including, but not limited to, the absence of in-person examination) and has agreed to be treated using telemedicine. The patient's/patient's family's questions regarding telemedicine have been answered.     If the phone/video visit was completed in an ambulatory setting, the patient has also been advised to contact their provider???s office for worsening conditions, and seek emergency medical treatment and/or call 911 if the patient deems either necessary.      ASSESSMENT and PLAN:    1-RA, no synovitis or pain on higher doses of prednisone currently, at 10 mg/d at the recommendation of his pulmonologist for his COPD. I will defer to his pulmonologist the tapering of prednisone as his respiratory disease is the priority now .   2-Autoimmune neutropenia, resolved.  3-OP and compression fractures. Completed two years of Forteo in January of 2022. Received Reclast in January of 2023 and suffered likely reaction with increased joint pain complicated by hypoxemia and bradycardia requiring hospitalization.     4-Peripheral neuropathy of hands and feet. Controlled with lyrica.       Continue to check MTX labs every 3 months.     Continue:    -    Pregabalin (LYRICA) 100 MG capsule; Take 1 capsule (100 mg total) by mouth Three (3) times a day for PN.  Continue:  -     syringe with needle (BD TUBERCULIN SYRINGE) 1 mL 27 x 1/2 Syrg; 1 mL by Miscellaneous route once a week.  -     methotrexate 25 mg/mL injection solution; Inject 0.8 mL (20 mg total) under the skin once a week.  -     PredniSONE (DELTASONE) 10 mg by mouth daily as recommended by his pulmonologist.  -     oxyCODONE-acetaminophen (PERCOCET) 7.5-325 mg per tablet; Take 1 tablet by mouth every six (6) hours as needed for pain.     RTC in 3-4 months.         Cc: resolved pain and improved dyspnea.     HPI:    The patient is a 71 y.o. 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 He is s/p two 1000 mg doses of rituximab on 03/30/19 and 04/13/19. He has severe COPD and was admitted to a local hospital [facility] [place] on 03/27/21 with aspiration RUL pneumonia due to H. Flu and sepsis. He also has osteoporosis and low vitamin D with compression fractures and is s/p tow years of Forteo. He received one infusion of Reclast on 09/14/21 and 1-2 days later, developed severe MSK pain that felt like an RA flare although, he does not recall noticing joint swelling. His prednisone was increased by his PCP to 25 mg/d. He was readmitted from 1/25-1/30 for hypoxemia and bradycardia ( 33  BPM) with non-sustain V.Tach and PVCs. He just finished a 24 hour patch and is followed by cardiology.   Prior to that, in [place] 2022, had another URI with a COPD exacerbation.     Today, he denied joint pain, joint swelling or AM stiffness. His breathing currently is also well controlled and denied dyspnea or cough. His pulmonologist saw him earlier this week and recommended he stays temporarily on prednisone 10 mg/d. He continues to self-inject MTX 20 mg/week.     I reviewed his labs from 09/24/21 that showed a sCr=0.69, ca= 8.5, ALT=24, AST=26, T Bili=1.51, Albumin=3.6, WBC=9.8, ANC=7.1, hg=11.1 and PLTs=317.     ROS:  all systems were reviewed and negative except as noted in the HPI.    Immunization History   Administered Date(s) Administered   ??? COVID-19 VACC,MRNA,(PFIZER)(PF) 10/21/2019, 11/18/2019   ??? COVID-19 VACCINE,MRNA(MODERNA)(PF) 04/12/2020, 05/11/2020, 08/12/2020   ??? INFLUENZA QUAD ADJUVANTED 41YR UP(FLUAD) 06/08/2018, 06/09/2019, 06/20/2020   ??? INFLUENZA QUAD HIGH DOSE 41YRS+(FLUZONE) 05/13/2017, 05/13/2017, 06/08/2018, 06/09/2019   ??? INFLUENZA TIV (TRI) 31MO+ W/ PRESERV (IM) 06/26/2011, 05/08/2012   ??? INFLUENZA TIV (TRI) PF (IM) 06/28/2011, 05/16/2015, 04/26/2017   ??? Influenza Vaccine Quad (IIV4 PF) 16mo+ injectable 04/26/2013, 05/11/2014, 05/16/2015, 05/09/2016   ??? Influenza Virus Vaccine, unspecified formulation 05/16/2015, 04/26/2017, 06/20/2020   ??? Influenza Whole 06/24/2007, 04/26/2008, 06/21/2009   ??? Novel Influenza-H1N1-09 Preservative Free 08/11/2008   ??? PNEUMOCOCCAL POLYSACCHARIDE 23 06/09/2006, 04/09/2011, 05/13/2017   ??? Pneumococcal Conjugate 13-Valent 09/12/2013, 05/11/2014   ??? Pneumococcal Conjugate, Unspecified Formulation 04/26/2017   ??? Pneumococcal, Unspecified Formulation 06/09/2006, 04/09/2011, 05/13/2017   ??? TdaP 05/08/2012        Past Medical History:   Diagnosis Date   ??? CAD (coronary artery disease)    ??? CHF (congestive heart failure) (CMS-HCC)    ??? COPD (chronic obstructive pulmonary disease) (CMS-HCC)    ??? Diabetes mellitus (CMS-HCC) 2008    Type 2   ??? DJD (degenerative joint disease)    ??? GERD (gastroesophageal reflux disease)    ??? Hyperlipidemia    ??? Hypertension    ??? MI (myocardial infarction) (CMS-HCC) 1999, 2077   ??? Osteomyelitis of foot (CMS-HCC)    ??? Osteoporosis 2012   ??? Peripheral neuropathy    ??? Pneumonia, aspiration (CMS-HCC)    ??? Psoriasis    ??? RA (rheumatoid arthritis) (CMS-HCC)    ??? Sicca (CMS-HCC)          Current Outpatient Medications:   ???  albuterol (PROVENTIL HFA;VENTOLIN HFA) 90 mcg/actuation inhaler, Inhale., Disp: , Rfl:   ???  ALPRAZolam (XANAX) 0.5 MG tablet, Take 1 tablet (0.5 mg total) by mouth Three (3) times a day as needed for sleep., Disp: 30 tablet, Rfl: 0  ???  ascorbic acid (VITAMIN C) 100 MG tablet, Take 500 mg by mouth daily. , Disp: , Rfl:   ???  aspirin (ECOTRIN) 81 MG tablet, Take 81 mg by mouth once daily., Disp: , Rfl:   ???  atorvastatin (LIPITOR) 80 MG tablet, Take 80 mg by mouth nightly., Disp: , Rfl:   ???  blood sugar diagnostic Strp, Test blood sugar no more than twice daily., Disp: , Rfl:   ???  cyanocobalamin (VITAMIN B-12) 250 MCG tablet, Take 250 mcg by mouth daily., Disp: , Rfl:   ???  dextromethorphan-guaifenesin (MUCINEX DM) 30-600 mg Tb12, Take by mouth., Disp: , Rfl:   ???  ELIQUIS 5 mg Tab, Take 5 mg by mouth Two (2) times a day., Disp: , Rfl: 0  ???  fluticasone-umeclidin-vilanter (TRELEGY ELLIPTA) 100-62.5-25 mcg inhaler, Inhale 1 puff., Disp: , Rfl:   ???  folic acid (FOLVITE) 400 MCG tablet, Take 2 tablets (800 mcg total) by mouth daily., Disp: 180 tablet, Rfl: 3  ???  furosemide (LASIX) 20 MG tablet, Take 60 mg by mouth Take as directed. 2 tabs in the am, 1 tab in the afternoon., Disp: , Rfl:   ???  hydrOXYzine (ATARAX) 25 MG tablet, Take 25 mg by mouth nightly., Disp: , Rfl:   ???  lidocaine (LIDODERM) 5 % patch, Place 1 patch on the skin every twelve (12) hours. Apply to affected area for 12 hours only each day (then remove patch), Disp: 30 patch, Rfl: 6  ???  loteprednol (LOTEMAX) 0.5 % ophthalmic suspension, INSTILL 1 DROP INTO BOTH EYES (MEDROL DOSE PACK SCHEDULING ONLY FOR 1 DOSE), Disp: 5 mL, Rfl: 6  ???  MELATONIN ORAL, Take 9 mg by mouth nightly. , Disp: , Rfl:   ???  metFORMIN (GLUCOPHAGE) 500 MG tablet, Take 500 mg by mouth. Use as directed, Disp: , Rfl:   ???  methotrexate 25 mg/mL injection solution, Inject 0.8 mL (20 mg total) under the skin once a week., Disp: 10 mL, Rfl: 6  ???  metoprolol tartrate (LOPRESSOR) 25 MG tablet, Take 50 mg by mouth Two (2) times a day. , Disp: , Rfl:   ???  mucus clearing device Devi, Blow through several times, 2 or 3 sets per day as needed, Disp: , Rfl:   ???  NITROSTAT 0.4 mg SL tablet, Place 1 tablet under the tongue once as needed. , Disp: , Rfl:   ???  ONETOUCH ULTRA TEST Strp, , Disp: , Rfl: 12  ???  ONETOUCH ULTRASOFT lancets, , Disp: , Rfl: 0  ???  oxyCODONE-acetaminophen (PERCOCET) 7.5-325 mg per tablet, Take 1 tablet by mouth every six (6) hours as needed for pain., Disp: 120 tablet, Rfl: 0  ???  oxyCODONE-acetaminophen (PERCOCET) 7.5-325 mg per tablet, Take 1 tablet by mouth every six (6) hours as needed for pain., Disp: 120 tablet, Rfl: 0  ???  [START ON 11/08/2021] oxyCODONE-acetaminophen (PERCOCET) 7.5-325 mg per tablet, Take 1 tablet by mouth every six (6) hours as needed for pain., Disp: 120 tablet, Rfl: 0  ???  pantoprazole (PROTONIX) 40 MG tablet, Take 40 mg by mouth Two (2) times a day. , Disp: , Rfl:   ???  potassium chloride SA (K-DUR,KLOR-CON) 10 MEQ tablet, TAKE 4 TS PO QAM AND TK 2 TS D AT 4PM, Disp: , Rfl: 9  ???  predniSONE (DELTASONE) 10 MG tablet, Take 20 mg/d for 3 days, 15 mg/d for 3 days, 10 mg/d for 3 days then, stay on 5 mg/d., Disp: 60 tablet, Rfl: 2  ???  predniSONE (DELTASONE) 5 MG tablet, Take 1 tablet (5 mg total) by mouth daily., Disp: 90 tablet, Rfl: 3  ???  pregabalin (LYRICA) 100 MG capsule, Take 1 capsule (100 mg total) by mouth Three (3) times a day., Disp: 90 capsule, Rfl: 5  ???  RITUXIMAB IV, Infuse into a venous catheter., Disp: , Rfl:   ???  sodium chloride (OCEAN) 0.65 % nasal spray, 1 spray into each nostril., Disp: , Rfl:   ???  sodium chloride 3 % nebulizer solution, Take 4 mLs by nebulization 2 (two) times a day as needed for Cough (Productive cough)., Disp: 750 mL, Rfl: 3  ???  syringe (BD TUBERCULIN SYRINGE) 1 mL 27 x 1/2 Syrg, 1 mL by Miscellaneous route once a week.,  Disp: 12 each, Rfl: 3  ???  tbo-filgrastim (GRANIX) 480 mcg/0.8 mL Syrg injection, Inject under the skin every seven (7) days., Disp: , Rfl:   ???  TURMERIC, BULK, MISC, 500 mg by Miscellaneous route every morning before breakfast., Disp: , Rfl:   ???  VIT A/VIT C/VIT E/ZINC/COPPER (PRESERVISION AREDS ORAL), Take 1 tablet by mouth daily., Disp: , Rfl:     PE:  Reported vital signs today done by patient at home:  BP: 97/69  . T:97.68F, . P:67. O2 sat on 3.5 L: 96% %.   GENERAL: NAD. In good spirits.   Skin: no vasculitis lesions. No visible inflammatory lesions.  EYES: sclera non-injected. No scleromalacia.  NECK: no visible masses or LAD.   MSK: advanced RA deformtities but no swelling across the MCPs or PIPs, wrists, elbows. There is restricted ROM of the wrists but not at the elbows. Shoulders,  Restricted flexion and internal rotation. Neck, no definite restriction.

## 2021-10-13 MED ORDER — OXYCODONE-ACETAMINOPHEN 7.5 MG-325 MG TABLET
ORAL_TABLET | Freq: Four times a day (QID) | ORAL | 0 refills | 30.00000 days | Status: CP | PRN
Start: 2021-10-13 — End: ?

## 2021-11-08 MED ORDER — OXYCODONE-ACETAMINOPHEN 7.5 MG-325 MG TABLET
ORAL_TABLET | Freq: Four times a day (QID) | ORAL | 0 refills | 30.00000 days | Status: CP | PRN
Start: 2021-11-08 — End: ?

## 2021-11-10 MED ORDER — OXYCODONE-ACETAMINOPHEN 7.5 MG-325 MG TABLET
ORAL_TABLET | Freq: Four times a day (QID) | ORAL | 0 refills | 30 days | Status: CP | PRN
Start: 2021-11-10 — End: 2022-11-10

## 2021-11-12 DIAGNOSIS — M059 Rheumatoid arthritis with rheumatoid factor, unspecified: Principal | ICD-10-CM

## 2021-11-12 DIAGNOSIS — M069 Rheumatoid arthritis, unspecified: Principal | ICD-10-CM

## 2021-11-12 MED ORDER — BD SAFETYGLIDE TUBERCULIN REGULAR BEVEL 1 ML 27 X 1/2" SYRINGE
SUBCUTANEOUS | 3 refills | 0 days | Status: CP
Start: 2021-11-12 — End: ?

## 2021-11-12 NOTE — Unmapped (Signed)
Syringe Refill  Last ov: Visit date not found  Next ov: 01/18/2022

## 2021-12-11 MED ORDER — OXYCODONE-ACETAMINOPHEN 7.5 MG-325 MG TABLET
ORAL_TABLET | Freq: Four times a day (QID) | ORAL | 0 refills | 30 days | Status: CP | PRN
Start: 2021-12-11 — End: ?

## 2021-12-14 NOTE — Unmapped (Signed)
Patient was calling to get a refill on saline

## 2021-12-17 MED FILL — SODIUM CHLORIDE 3 % FOR NEBULIZATION: RESPIRATORY_TRACT | 30 days supply | Qty: 900 | Fill #1

## 2022-01-08 DIAGNOSIS — M8000XA Age-related osteoporosis with current pathological fracture, unspecified site, initial encounter for fracture: Principal | ICD-10-CM

## 2022-01-08 DIAGNOSIS — M059 Rheumatoid arthritis with rheumatoid factor, unspecified: Principal | ICD-10-CM

## 2022-01-08 DIAGNOSIS — M069 Rheumatoid arthritis, unspecified: Principal | ICD-10-CM

## 2022-01-08 MED ORDER — METHOTREXATE SODIUM 25 MG/ML INJECTION SOLUTION
SUBCUTANEOUS | 6 refills | 84 days | Status: CP
Start: 2022-01-08 — End: ?

## 2022-01-08 NOTE — Unmapped (Signed)
Addended by: Rachel Bo D on: 01/08/2022 02:41 PM     Modules accepted: Orders

## 2022-01-08 NOTE — Unmapped (Signed)
Reason for call:   Pt is requesting for his MXT to please be sent to [facility]. PT is unable to get injection at local pharmacies in his area.     Last ov: Visit date not found  Next ov: 01/18/2022

## 2022-01-08 NOTE — Unmapped (Signed)
Methotrexate refill  Last Visit Date: 10/12/2021   Next Visit Date: 01/18/2022       See labs under Care Everywhere.

## 2022-01-09 NOTE — Unmapped (Signed)
[facility]   Patient Onboarding/Medication Counseling    Patrick Hunter is a 71 y.o. male with RA who I am counseling today on continuation of therapy.  I am speaking to the patient.    Was a Nurse, learning disability used for this call? No    Verified patient's date of birth / HIPAA.    Specialty medication(s) to be sent: Inflammatory Disorders: methotrexate (injectable)      Non-specialty medications/supplies to be sent: n/a      Medications not needed at this time: syringes         methotrexate injection    Medication & Administration     Dosage: Inject  0.8 mL (20 mg) subcutaneously every 7 days      Lab tests required prior to treatment initiation:  ??? Pregnancy: Patient is male: screening not applicable.      Administration:     Vials and syringes  1. Gather all supplies needed for injection on a clean, flat working surface: medication vial, syringe with needle, alcohol swabs, sharps container, etc.  2. Look at the medication label - look for correct medication, correct dose, and check the expiration date  3. Look at the medication - the liquid in the syringe should appear clear and yellow in color  4. Select injection site - you can use the front of your thigh or your belly (but not the area 2 inches around your belly button)  5. Prepare injection site - wash your hands and clean the skin at the injection site with an alcohol swab and let it air dry, do not touch the injection site again before the injection  6. Pop the protective cap off the top of the vial if it's the first use for that vial and wipe the rubber stopper with an alcohol wipe and allow to air dry  7. Open syringe and needle and carefully remove the needle cap, pull the plunger back to the volume you'll need to inject  8. With the methotrexate vial on a flat surface, insert the needle straight through the middle of the rubber stopper until the needle is all the way down, push the plunger until all the air is injected into the vial  9. Hold the vial and syringe and turn them upside down while keeping the needle in the vial, pull down the plunger to withdraw the desired volume of liquid from the vial, carefully remove the needle from the vial  10. Pinch the skin - with your hand not holding the syringe pinch up a fold of skin at the injection site using your forefinger and thumb  11. Insert the needle into the fold of skin at about a 45 degree angle - it's best to use a quick dart-like motion - with the needle inserted, release the pinch of skin and allow the skin to relax  12. Push the plunger down slowly as far as it will go until the syringe is empty  13. Check that the syringe is empty and carefully pull the needle out at the same angle as inserted; after the needle is removed completely from the skin  14. Dispose of the used syringe immediately in your sharps disposal container  15. If you see any blood at the injection site, press a cotton ball or gauze on the site and maintain pressure until the bleeding stops, do not rub the injection site      Adherence/Missed dose instructions:  If your injection is given more than 2  days after your scheduled injection date - consult your pharmacist for additional instructions on how to adjust your dosing schedule.    Goals of Therapy       Rheumatoid arthritis  ??? Achieve symptom remission  ??? Slow disease progression  ??? Protection of remaining articular structures  ??? Maintenance of function  ??? Maintenance of effective psychosocial functioning          Side Effects & Monitoring Parameters     ??? Upset stomach, diarrhea, nausea or throwing up  ??? Feeling dizzy, tired, or weak  ??? Hair thinning (reversible)  ??? Minor cold-like symptoms  ??? Photosensitivity - use sunscreen and avoid prolonged exposure    The following side effects should be reported to the provider:  ??? Signs of a hypersensitivity reaction - rash; hives; itching; red, swollen, blistered, or peeling skin; wheezing; tightness in the chest or throat; difficulty breathing, swallowing, or talking; swelling of the mouth, face, lips, tongue, or throat; etc.  ??? Reduced immune function - report signs of infection such as fever; chills; body aches; very bad sore throat; ear or sinus pain; cough; more sputum or change in color of sputum; pain with passing urine; wound that will not heal, etc.  Also at a slightly higher risk of some malignancies (mainly skin and blood cancers) due to this reduced immune function.  o In the case of signs of infection - the patient should hold the next dose of methotrexate and call your primary care provider to ensure adequate medical care.  Treatment may be resumed when infection is treated and patient is asymptomatic.  ??? Signs of bleeding - throwing up or coughing up blood; blood in the urine; black, red, or tarry stool; abnormal vaginal bleeding; bruises without a cause or that get bigger  ??? Signs of pancreatitis - sudden very bad stomach pain, unexplained vomiting  ??? Signs of kidney dysfunction - unable to pass urine; blood in the urine; sudden weight gain  ??? Signs of liver dysfunction - darkened urine; upset stomach; light-colored stool; yellow skin or eyes  ??? Signs of nerve problems - burning; numbness; tingling  ??? Pinpoint red spots on the skin  ??? Changes in eyesight      Contraindications, Warnings, & Precautions     ??? Have your bloodwork checked as you have been told by your prescriber  ??? Talk with your doctor if you are pregnant, planning to become pregnant, or breastfeeding - women taking this medication must use birth control while taking this drug and for some time after the last dose  ??? Discuss the possible need for holding your dose(s) of methotrexate when a planned procedure is scheduled with the prescriber as it may delay healing/recovery timeline       Drug/Food Interactions     ??? Medication list reviewed in Epic. The patient was instructed to inform the care team before taking any new medications or supplements. No drug interactions identified.     Storage, Handling Precautions, & Disposal     ??? Store intact vials at room temperature  ??? Protect from light  ??? Dispose of used syringes in a sharps disposal container  ??? 50mg /72ml vials contain preservative and can be used as a multi-dose vial to be discarded 30 days after the first puncture if stored in refrigeration and protected from light  (Methotrexate Vial How Supplied/Storage and Handling Building surveyor Information - [place] )            Current Medications (  including OTC/herbals), Comorbidities and Allergies     Current Outpatient Medications   Medication Sig Dispense Refill   ??? albuterol (PROVENTIL HFA;VENTOLIN HFA) 90 mcg/actuation inhaler Inhale.     ??? ALPRAZolam (XANAX) 0.5 MG tablet Take 1 tablet (0.5 mg total) by mouth Three (3) times a day as needed for sleep. 30 tablet 0   ??? ascorbic acid (VITAMIN C) 100 MG tablet Take 500 mg by mouth daily.      ??? aspirin (ECOTRIN) 81 MG tablet Take 81 mg by mouth once daily.     ??? atorvastatin (LIPITOR) 80 MG tablet Take 80 mg by mouth nightly.     ??? blood sugar diagnostic Strp Test blood sugar no more than twice daily.     ??? cyanocobalamin (VITAMIN B-12) 250 MCG tablet Take 250 mcg by mouth daily.     ??? dextromethorphan-guaifenesin (MUCINEX DM) 30-600 mg Tb12 Take by mouth.     ??? ELIQUIS 5 mg Tab Take 5 mg by mouth Two (2) times a day.  0   ??? fluticasone-umeclidin-vilanter (TRELEGY ELLIPTA) 100-62.5-25 mcg inhaler Inhale 1 puff.     ??? folic acid (FOLVITE) 400 MCG tablet Take 2 tablets (800 mcg total) by mouth daily. 180 tablet 3   ??? furosemide (LASIX) 20 MG tablet Take 60 mg by mouth Take as directed. 2 tabs in the am, 1 tab in the afternoon.     ??? hydrOXYzine (ATARAX) 25 MG tablet Take 25 mg by mouth nightly.     ??? lidocaine (LIDODERM) 5 % patch Place 1 patch on the skin every twelve (12) hours. Apply to affected area for 12 hours only each day (then remove patch) 30 patch 6   ??? loteprednol (LOTEMAX) 0.5 % ophthalmic suspension INSTILL 1 DROP INTO BOTH EYES (MEDROL DOSE PACK SCHEDULING ONLY FOR 1 DOSE) 5 mL 6   ??? MELATONIN ORAL Take 9 mg by mouth nightly.      ??? metFORMIN (GLUCOPHAGE) 500 MG tablet Take 500 mg by mouth. Use as directed     ??? methotrexate 25 mg/mL injection solution Inject 0.8 mL (20 mg total) under the skin once a week. 10 mL 6   ??? metoprolol tartrate (LOPRESSOR) 25 MG tablet Take 50 mg by mouth Two (2) times a day.      ??? mucus clearing device [employer] through several times, 2 or 3 sets per day as needed     ??? NITROSTAT 0.4 mg SL tablet Place 1 tablet under the tongue once as needed.      ??? ONETOUCH ULTRA TEST Strp   12   ??? ONETOUCH ULTRASOFT lancets   0   ??? oxyCODONE-acetaminophen (PERCOCET) 7.5-325 mg per tablet Take 1 tablet by mouth every six (6) hours as needed for pain. 120 tablet 0   ??? oxyCODONE-acetaminophen (PERCOCET) 7.5-325 mg per tablet Take 1 tablet by mouth every six (6) hours as needed for pain. 120 tablet 0   ??? oxyCODONE-acetaminophen (PERCOCET) 7.5-325 mg per tablet Take 1 tablet by mouth every six (6) hours as needed for pain. 120 tablet 0   ??? pantoprazole (PROTONIX) 40 MG tablet Take 40 mg by mouth Two (2) times a day.      ??? potassium chloride SA (K-DUR,KLOR-CON) 10 MEQ tablet TAKE 4 TS PO QAM AND TK 2 TS D AT 4PM  9   ??? predniSONE (DELTASONE) 10 MG tablet Take 1 tablet (10 mg total) by mouth daily. 30 tablet 5   ??? predniSONE (DELTASONE) 5  MG tablet Take 1 tablet (5 mg total) by mouth daily. 90 tablet 3   ??? pregabalin (LYRICA) 100 MG capsule Take 1 capsule (100 mg total) by mouth Three (3) times a day. 90 capsule 5   ??? RITUXIMAB IV Infuse into a venous catheter.     ??? sodium chloride (OCEAN) 0.65 % nasal spray 1 spray into each nostril.     ??? sodium chloride 3 % nebulizer solution Measure out 4 mLs and use by nebulization 2 (two) times a day as needed for Cough (Productive cough). Discard remaining amount and use a new vial for each dose. 750 mL 3   ??? syringe (BD SAFETYGLIDE TB REG BEVEL) 1 mL 27 x 1/2 Syrg Inject 1 Syringe under the skin once a week. 12 each 3   ??? tbo-filgrastim (GRANIX) 480 mcg/0.8 mL Syrg injection Inject under the skin every seven (7) days.     ??? TURMERIC, BULK, MISC 500 mg by Miscellaneous route every morning before breakfast.     ??? VIT A/VIT C/VIT E/ZINC/COPPER (PRESERVISION AREDS ORAL) Take 1 tablet by mouth daily.       No current facility-administered medications for this visit.       Allergies   Allergen Reactions   ??? Ambien [Zolpidem] Hallucinations       Patient Active Problem List   Diagnosis   ??? COPD (chronic obstructive pulmonary disease) (CMS-HCC)   ??? Osteoarthrosis   ??? Seropositive rheumatoid arthritis (CMS-HCC)   ??? Sicca syndrome (CMS-HCC)   ??? Hoarseness   ??? Hammertoe   ??? Diabetic polyneuropathy associated with type 2 diabetes mellitus (CMS-HCC)   ??? Psoriasis   ??? Pulmonary emphysema (CMS-HCC)   ??? Callus of foot   ??? Diabetic ulcer of left foot (CMS-HCC)   ??? Diabetic peripheral neuropathy (CMS-HCC)   ??? Toe amputation status   ??? Diabetic ulcer of right foot with fat layer exposed (CMS-HCC)   ??? Rheumatoid factor positive   ??? Other neutropenia (CMS-HCC)   ??? Neutropenia (CMS-HCC)   ??? Other drug-induced agranulocytosis (CMS-HCC)    ??? Osteoporosis with current pathological fracture   ??? Compression fracture of T12 vertebra with routine healing       Reviewed and up to date in Epic.    Appropriateness of Therapy     Acute infections noted within Epic:  No active infections  Patient reported infection: None    Is medication and dose appropriate based on diagnosis and infection status? Yes    Prescription has been clinically reviewed: Yes      Baseline Quality of Life Assessment      How many days over the past month did your condition  keep you from your normal activities? For example, brushing your teeth or getting up in the morning. Patient declined to answer    Financial Information     Medication Assistance provided: None Required    Anticipated copay of $4.43 reviewed with patient. Verified delivery address.    Delivery Information     Scheduled delivery date: 5/19    Expected start date: 5/20     Medication will be delivered via UPS to the prescription address in [facility].  This shipment will not require a signature.      Explained the services we provide at [facility] and that each month we would call to set up refills.  Stressed importance of returning phone calls so that we could ensure they receive their medications in time each month.  Informed patient that we should be setting up refills 7-10 days prior to when they will run out of medication.  A pharmacist will reach out to perform a clinical assessment periodically.  Informed patient that a welcome packet, containing information about our pharmacy and other support services, a Notice of Privacy Practices, and a drug information handout will be sent.      The patient or caregiver noted above participated in the development of this care plan and knows that they can request review of or adjustments to the care plan at any time.      Patient or caregiver verbalized understanding of the above information as well as how to contact the pharmacy at [phone] option 4 with any questions/concerns.  The pharmacy is open Monday through Friday 8:30am-4:30pm.  A pharmacist is available 24/7 via pager to answer any clinical questions they may have.    Patient Specific Needs     - Does the patient have any physical, cognitive, or cultural barriers? No    - Does the patient have adequate living arrangements? (i.e. the ability to store and take their medication appropriately) Yes    - Did you identify any home environmental safety or security hazards? No    - Patient prefers to have medications discussed with  Patient     - Is the patient or caregiver able to read and understand education materials at a high school level or above? Yes    - Patient's primary language is  English     - Is the patient high risk? No    SOCIAL DETERMINANTS OF HEALTH     At the [facility], we have learned that life circumstances - like trouble affording food, housing, utilities, or transportation can affect the health of many of our patients.   That is why we wanted to ask: are you currently experiencing any life circumstances that are negatively impacting your health and/or quality of life? Patient declined to answer    Social Determinants of Health     Financial Resource Strain: Not on file   Internet Connectivity: Not on file   Food Insecurity: Not on file   Tobacco Use: Medium Risk   ??? Smoking Tobacco Use: Former   ??? Smokeless Tobacco Use: Never   ??? Passive Exposure: Not on file   Housing/Utilities: Not on file   Alcohol Use: Not on file   Transportation Needs: Not on file   Substance Use: Not on file   Health Literacy: Not on file   Physical Activity: Not on file   Interpersonal Safety: Not on file   Stress: Not on file   Intimate Partner Violence: Not on file   Depression: Not on file   Social Connections: Not on file       Would you be willing to receive help with any of the needs that you have identified today? Not applicable       Julianne Rice  [facility] Specialty Pharmacist

## 2022-01-09 NOTE — Unmapped (Signed)
[facility] Specialty Medication Onboarding    Specialty Medication: methotrexate 25 mg/mL injection solution  Prior Authorization: Not Required   Financial Assistance: No - copay  <$25  Final Copay/Day Supply: $10.72 / 84    Insurance Restrictions: None     Notes to Pharmacist: n/a    The triage team has completed the benefits investigation and has determined that the patient is able to fill this medication at [facility]. Please contact the patient to complete the onboarding or follow up with the prescribing physician as needed.

## 2022-01-10 MED FILL — METHOTREXATE SODIUM 25 MG/ML INJECTION SOLUTION: SUBCUTANEOUS | 28 days supply | Qty: 4 | Fill #0

## 2022-01-15 NOTE — Unmapped (Signed)
I am located on site and the patient is located off-site for this visit.      I spent 30 minutes on video with the patient. I spent an additional 10 minutes on pre- and post-visit activities.     The patient was physically located in [place] or a state in which I am permitted to provide care. The patient understood that s/he may incur co-pays and cost sharing, and agreed to the telemedicine visit. The visit was completed via phone and/or video, which was appropriate and reasonable under the circumstances given the patient's presentation at the time.    The patient has been advised of the potential risks and limitations of this mode of treatment (including, but not limited to, the absence of in-person examination) and has agreed to be treated using telemedicine. The patient's/patient's family's questions regarding telemedicine have been answered.     If the phone/video visit was completed in an ambulatory setting, the patient has also been advised to contact their provider???s office for worsening conditions, and seek emergency medical treatment and/or call 911 if the patient deems either necessary.      IMPRESSION:  1. RA, appears stable without synovitis.   2. Recurrent admissions for sepsis and respiratory failure. Patient with severe COPD.  3. Osteoporosis with compression fractures, s/p Forteo for 2 years. He did not tolerate Reclast due to allergic reaction. Will attempt a trial of alendronate.  4. Autoimmune neutropenia, stable for several years now.  5. Peripheral neuropathy.     PLAN:    Will check quantitative immunoglobulins.   ??? Try alendronate for osteoporosis.  ??? Continue MTX 20 mg weekly with folic acid replacement.   ??? Will continue to monitor labs every 3 months.  ??? Will await results of immunoglobulin results to determine dose change.  ??? Continue prednisone at the lowest dose possible, 5 mg daily currently.  ??? Continue pregabalin 100 mg TID.    RTC in 4 months.    Scribe's Attestation: Nelda Bucks MD obtained and performed the history, physical exam and medical decision making elements that were entered into the chart.  Signed by Lauralee Evener, Scribe, on Jan 18, 2022 1:42 PM.    The documentation recorded by the scribe accurately reflects the service I personally performed and the decisions made by me. Lillia Pauls, MD  ___________________________________________________    Chief Complaint: resolved pain and improved dyspnea.     HPI:  The patient is a 71 y.o. 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 He is s/p two 1000 mg doses of rituximab on 03/30/19 and 04/13/19. He has severe COPD and was admitted to a local hospital [facility]) on 03/27/21 with aspiration RUL pneumonia due to H. Flu and sepsis. He also has osteoporosis and low vitamin D with compression fractures and is s/p tow years of Forteo. He received one infusion of Reclast on 09/14/21 and 1-2 days later, developed severe MSK pain that felt like an RA flare although, he does not recall noticing joint swelling. His prednisone was increased by his PCP to 25 mg/d. He was readmitted from 1/25-1/30 for hypoxemia and bradycardia ( 33 BPM) with non-sustain V.Tach and PVCs. He just finished a 24 hour patch and is followed by cardiology.   Prior to that, in [place] 2022, had another URI with a COPD exacerbation.     He was hospitalized 2/28-3/5 for septic shock, believes he had  infection from granddaughter who had cold.    Today, patient is currently on 3L O2. Still doing home PT and is planning for 8 more weeks of treatment. He will start pulmonary rehabilitation therapy at the hospital after his PT finishes. Using nebulizer 3 times daily which is helping his symptoms. Saw pulmonology yesterday, lungs were clear but they believed he has thrush so is on 8-day course of fluconazole.    He is now on glipizide daily and has altered his diet. Reports his BG levels have improved with this. BG levels were 92 this morning, 134 last night about 2 hours after dinner. His prednisone is down to 5 mg daily.    In terms of his arthritis, he has had no changes. Uses Lyrica at night, which helps with hand pain. He had some swelling in legs R>L, doppler showed no DVT.      ROS:  all systems were reviewed and negative except as noted in the HPI.      Immunization History   Administered Date(s) Administered   ??? COVID-19 VACC,MRNA,(PFIZER)(PF) 10/21/2019, 11/18/2019   ??? COVID-19 VACCINE,MRNA(MODERNA)(PF) 04/12/2020, 05/11/2020, 08/12/2020   ??? INFLUENZA QUAD ADJUVANTED 78YR UP(FLUAD) 06/08/2018, 06/09/2019, 06/20/2020   ??? INFLUENZA QUAD HIGH DOSE 78YRS+(FLUZONE) 05/13/2017, 05/13/2017, 06/08/2018, 06/09/2019   ??? INFLUENZA TIV (TRI) 47MO+ W/ PRESERV (IM) 06/26/2011, 05/08/2012   ??? INFLUENZA TIV (TRI) PF (IM) 06/28/2011, 05/16/2015, 04/26/2017   ??? Influenza Vaccine Quad (IIV4 PF) 32mo+ injectable 04/26/2013, 05/11/2014, 05/16/2015, 05/09/2016   ??? Influenza Virus Vaccine, unspecified formulation 05/16/2015, 04/26/2017, 06/20/2020   ??? Influenza Whole 06/24/2007, 04/26/2008, 06/21/2009   ??? Novel Influenza-H1N1-09 Preservative Free 08/11/2008   ??? PNEUMOCOCCAL POLYSACCHARIDE 23 06/09/2006, 04/09/2011, 05/13/2017   ??? Pneumococcal Conjugate 13-Valent 09/12/2013, 05/11/2014   ??? Pneumococcal Conjugate, Unspecified Formulation 04/26/2017   ??? Pneumococcal, Unspecified Formulation 06/09/2006, 04/09/2011, 05/13/2017   ??? TdaP 05/08/2012        Past Medical History:   Diagnosis Date   ??? CAD (coronary artery disease)    ??? CHF (congestive heart failure) (CMS-HCC)    ??? COPD (chronic obstructive pulmonary disease) (CMS-HCC)    ??? Diabetes mellitus (CMS-HCC) 2008    Type 2   ??? DJD (degenerative joint disease)    ??? GERD (gastroesophageal reflux disease)    ??? Hyperlipidemia    ??? Hypertension    ??? MI (myocardial infarction) (CMS-HCC) 1999, 2077   ??? Osteomyelitis of foot (CMS-HCC)    ??? Osteoporosis 2012   ??? Peripheral neuropathy    ??? Pneumonia, aspiration (CMS-HCC)    ??? Psoriasis    ??? RA (rheumatoid arthritis) (CMS-HCC)    ??? Sicca (CMS-HCC)          Current Outpatient Medications:   ???  albuterol (PROVENTIL HFA;VENTOLIN HFA) 90 mcg/actuation inhaler, Inhale., Disp: , Rfl:   ???  ALPRAZolam (XANAX) 0.5 MG tablet, Take 1 tablet (0.5 mg total) by mouth Three (3) times a day as needed for sleep., Disp: 30 tablet, Rfl: 0  ???  ascorbic acid (VITAMIN C) 100 MG tablet, Take 500 mg by mouth daily. , Disp: , Rfl:   ???  aspirin (ECOTRIN) 81 MG tablet, Take 81 mg by mouth once daily., Disp: , Rfl:   ???  atorvastatin (LIPITOR) 80 MG tablet, Take 80 mg by mouth nightly., Disp: , Rfl:   ???  blood sugar diagnostic Strp, Test blood sugar no more than twice daily., Disp: , Rfl:   ???  cyanocobalamin (VITAMIN B-12) 250 MCG tablet, Take 250 mcg by  mouth daily., Disp: , Rfl:   ???  dextromethorphan-guaifenesin (MUCINEX DM) 30-600 mg Tb12, Take by mouth., Disp: , Rfl:   ???  ELIQUIS 5 mg Tab, Take 5 mg by mouth Two (2) times a day., Disp: , Rfl: 0  ???  fluticasone-umeclidin-vilanter (TRELEGY ELLIPTA) 100-62.5-25 mcg inhaler, Inhale 1 puff., Disp: , Rfl:   ???  folic acid (FOLVITE) 400 MCG tablet, Take 2 tablets (800 mcg total) by mouth daily., Disp: 180 tablet, Rfl: 3  ???  furosemide (LASIX) 20 MG tablet, Take 60 mg by mouth Take as directed. 2 tabs in the am, 1 tab in the afternoon., Disp: , Rfl:   ???  hydrOXYzine (ATARAX) 25 MG tablet, Take 25 mg by mouth nightly., Disp: , Rfl:   ???  loteprednol (LOTEMAX) 0.5 % ophthalmic suspension, INSTILL 1 DROP INTO BOTH EYES (MEDROL DOSE PACK SCHEDULING ONLY FOR 1 DOSE), Disp: 5 mL, Rfl: 6  ???  MELATONIN ORAL, Take 9 mg by mouth nightly. , Disp: , Rfl:   ???  metFORMIN (GLUCOPHAGE) 500 MG tablet, Take 500 mg by mouth. Use as directed, Disp: , Rfl:   ???  methotrexate 25 mg/mL injection solution, Inject 0.8 mL (20 mg total) under the skin once a week., Disp: 10 mL, Rfl: 6  ???  metoprolol tartrate (LOPRESSOR) 25 MG tablet, Take 50 mg by mouth Two (2) times a day. , Disp: , Rfl:   ???  mucus clearing device Devi, Blow through several times, 2 or 3 sets per day as needed, Disp: , Rfl:   ???  NITROSTAT 0.4 mg SL tablet, Place 1 tablet under the tongue once as needed. , Disp: , Rfl:   ???  ONETOUCH ULTRA TEST Strp, , Disp: , Rfl: 12  ???  ONETOUCH ULTRASOFT lancets, , Disp: , Rfl: 0  ???  oxyCODONE-acetaminophen (PERCOCET) 7.5-325 mg per tablet, Take 1 tablet by mouth every six (6) hours as needed for pain., Disp: 120 tablet, Rfl: 0  ???  oxyCODONE-acetaminophen (PERCOCET) 7.5-325 mg per tablet, Take 1 tablet by mouth every six (6) hours as needed for pain., Disp: 120 tablet, Rfl: 0  ???  oxyCODONE-acetaminophen (PERCOCET) 7.5-325 mg per tablet, Take 1 tablet by mouth every six (6) hours as needed for pain., Disp: 120 tablet, Rfl: 0  ???  pantoprazole (PROTONIX) 40 MG tablet, Take 40 mg by mouth Two (2) times a day. , Disp: , Rfl:   ???  potassium chloride SA (K-DUR,KLOR-CON) 10 MEQ tablet, TAKE 4 TS PO QAM AND TK 2 TS D AT 4PM, Disp: , Rfl: 9  ???  predniSONE (DELTASONE) 10 MG tablet, Take 1 tablet (10 mg total) by mouth daily., Disp: 30 tablet, Rfl: 5  ???  predniSONE (DELTASONE) 5 MG tablet, Take 1 tablet (5 mg total) by mouth daily., Disp: 90 tablet, Rfl: 3  ???  pregabalin (LYRICA) 100 MG capsule, Take 1 capsule (100 mg total) by mouth Three (3) times a day., Disp: 90 capsule, Rfl: 5  ???  RITUXIMAB IV, Infuse into a venous catheter., Disp: , Rfl:   ???  sodium chloride (OCEAN) 0.65 % nasal spray, 1 spray into each nostril., Disp: , Rfl:   ???  sodium chloride 3 % nebulizer solution, Measure out 4 mLs and use by nebulization 2 (two) times a day as needed for Cough (Productive cough). Discard remaining amount and use a new vial for each dose., Disp: 750 mL, Rfl: 3  ???  syringe (BD SAFETYGLIDE TB REG BEVEL) 1 mL 27  x 1/2 Syrg, Inject 1 Syringe under the skin once a week., Disp: 12 each, Rfl: 3  ???  tbo-filgrastim (GRANIX) 480 mcg/0.8 mL Syrg injection, Inject under the skin every seven (7) days., Disp: , Rfl:   ???  TURMERIC, BULK, MISC, 500 mg by Miscellaneous route every morning before breakfast., Disp: , Rfl:   ???  VIT A/VIT C/VIT E/ZINC/COPPER (PRESERVISION AREDS ORAL), Take 1 tablet by mouth daily., Disp: , Rfl:     PE:  Reported vital signs today done by patient at home: BP: 144/90. T: 98.46F. P: 86. Weight: 218 lbs. Glu: 92.  GENERAL: NAD. In good spirits.   Skin: no vasculitis lesions. No visible inflammatory lesions.  EYES: sclera non-injected. No scleromalacia.  NECK: no visible masses or LAD.   MSK: advanced RA deformtities but no swelling across the MCPs or PIPs, wrists, elbows. There is restricted ROM of the wrists but not at the elbows. Shoulders,  Restricted flexion and internal rotation. Neck, no definite restriction.

## 2022-01-18 ENCOUNTER — Telehealth: Admit: 2022-01-18 | Payer: MEDICARE | Attending: Rheumatology | Primary: Rheumatology

## 2022-01-18 MED ORDER — OXYCODONE-ACETAMINOPHEN 7.5 MG-325 MG TABLET
ORAL_TABLET | Freq: Four times a day (QID) | ORAL | 0 refills | 30 days | Status: CP | PRN
Start: 2022-01-18 — End: ?

## 2022-01-18 MED ORDER — PREGABALIN 100 MG CAPSULE
ORAL_CAPSULE | Freq: Three times a day (TID) | ORAL | 5 refills | 30 days | Status: CP
Start: 2022-01-18 — End: ?

## 2022-01-30 NOTE — Unmapped (Signed)
[facility] Clinical Assessment & Refill Coordination Note    Patrick Hunter, DOB: 1950/12/03  Phone: [phone] (home) [phone] (work)    All above HIPAA information was verified with patient.     Was a Nurse, learning disability used for this call? No    Specialty Medication(s):   Inflammatory Disorders: methotrexate (injectable)     Current Outpatient Medications   Medication Sig Dispense Refill   ??? albuterol (PROVENTIL HFA;VENTOLIN HFA) 90 mcg/actuation inhaler Inhale.     ??? ALPRAZolam (XANAX) 0.5 MG tablet Take 1 tablet (0.5 mg total) by mouth Three (3) times a day as needed for sleep. 30 tablet 0   ??? ascorbic acid (VITAMIN C) 100 MG tablet Take 500 mg by mouth daily.      ??? aspirin (ECOTRIN) 81 MG tablet Take 81 mg by mouth once daily.     ??? atorvastatin (LIPITOR) 80 MG tablet Take 80 mg by mouth nightly.     ??? blood sugar diagnostic Strp Test blood sugar no more than twice daily.     ??? cyanocobalamin (VITAMIN B-12) 250 MCG tablet Take 250 mcg by mouth daily.     ??? dextromethorphan-guaifenesin (MUCINEX DM) 30-600 mg Tb12 Take by mouth.     ??? ELIQUIS 5 mg Tab Take 5 mg by mouth Two (2) times a day.  0   ??? fluticasone-umeclidin-vilanter (TRELEGY ELLIPTA) 100-62.5-25 mcg inhaler Inhale 1 puff.     ??? folic acid (FOLVITE) 400 MCG tablet Take 2 tablets (800 mcg total) by mouth daily. 180 tablet 3   ??? furosemide (LASIX) 20 MG tablet Take 60 mg by mouth Take as directed. 2 tabs in the am, 1 tab in the afternoon.     ??? hydrOXYzine (ATARAX) 25 MG tablet Take 25 mg by mouth nightly.     ??? loteprednol (LOTEMAX) 0.5 % ophthalmic suspension INSTILL 1 DROP INTO BOTH EYES (MEDROL DOSE PACK SCHEDULING ONLY FOR 1 DOSE) 5 mL 6   ??? MELATONIN ORAL Take 9 mg by mouth nightly.      ??? metFORMIN (GLUCOPHAGE) 500 MG tablet Take 500 mg by mouth. Use as directed     ??? methotrexate sodium (METHOTREXATE, CONTAINS PRESERVATIVES,) 25 mg/mL injection solution Inject 0.8 mL (20 mg total) under the skin once a week. 10 mL 6   ??? metoprolol tartrate (LOPRESSOR) 25 MG tablet Take 50 mg by mouth Two (2) times a day.      ??? mucus clearing device [employer] through several times, 2 or 3 sets per day as needed     ??? NITROSTAT 0.4 mg SL tablet Place 1 tablet under the tongue once as needed.      ??? ONETOUCH ULTRA TEST Strp   12   ??? ONETOUCH ULTRASOFT lancets   0   ??? oxyCODONE-acetaminophen (PERCOCET) 7.5-325 mg per tablet Take 1 tablet by mouth every six (6) hours as needed for pain. 120 tablet 0   ??? [START ON 02/18/2022] oxyCODONE-acetaminophen (PERCOCET) 7.5-325 mg per tablet Take 1 tablet by mouth every six (6) hours as needed for pain. 120 tablet 0   ??? [START ON 03/20/2022] oxyCODONE-acetaminophen (PERCOCET) 7.5-325 mg per tablet Take 1 tablet by mouth every six (6) hours as needed for pain. 120 tablet 0   ??? pantoprazole (PROTONIX) 40 MG tablet Take 40 mg by mouth Two (2) times a day.      ??? potassium chloride SA (K-DUR,KLOR-CON) 10 MEQ tablet TAKE 4 TS PO QAM AND TK 2 TS  D AT 4PM  9   ??? predniSONE (DELTASONE) 10 MG tablet Take 1 tablet (10 mg total) by mouth daily. 30 tablet 5   ??? predniSONE (DELTASONE) 5 MG tablet Take 1 tablet (5 mg total) by mouth daily. 90 tablet 3   ??? pregabalin (LYRICA) 100 MG capsule Take 1 capsule (100 mg total) by mouth Three (3) times a day. 90 capsule 5   ??? RITUXIMAB IV Infuse into a venous catheter.     ??? sodium chloride (OCEAN) 0.65 % nasal spray 1 spray into each nostril.     ??? sodium chloride 3 % NEBULIZER solution Measure out 4 mLs and use by nebulization 2 (two) times a day as needed for Cough (Productive cough). Discard remaining amount and use a new vial for each dose. 750 mL 3   ??? syringe (BD SAFETYGLIDE TB REG BEVEL) 1 mL 27 x 1/2 Syrg Inject 1 Syringe under the skin once a week. 12 each 3   ??? tbo-filgrastim (GRANIX) 480 mcg/0.8 mL Syrg injection Inject under the skin every seven (7) days.     ??? TURMERIC, BULK, MISC 500 mg by Miscellaneous route every morning before breakfast.     ??? VIT A/VIT C/VIT E/ZINC/COPPER (PRESERVISION AREDS ORAL) Take 1 tablet by mouth daily.       No current facility-administered medications for this visit.        Changes to medications: [name] reports no changes at this time.    Allergies   Allergen Reactions   ??? Ambien [Zolpidem] Hallucinations       Changes to allergies: No    SPECIALTY MEDICATION ADHERENCE       Medication Adherence    Patient reported X missed doses in the last month: 1  Specialty Medication: methotrexate 25mg /ml  Patient is on additional specialty medications: No  Informant: patient          Specialty medication(s) dose(s) confirmed: Regimen is correct and unchanged.     Are there any concerns with adherence? No    Adherence counseling provided? Not needed    CLINICAL MANAGEMENT AND INTERVENTION      Clinical Benefit Assessment:    Do you feel the medicine is effective or helping your condition? Yes    Clinical Benefit counseling provided? Progress note from 5/26 shows evidence of clinical benefit    Adverse Effects Assessment:    Are you experiencing any side effects? No    Are you experiencing difficulty administering your medicine? No    Quality of Life Assessment:    Quality of Life    Rheumatology  1. What impact has your specialty medication had on the reduction of your daily pain level?: Tremendous  2. What impact has your specialty medication had on your ability to complete daily tasks (prepare meals, get dressed, etc...)?Marland Kitchen Tremendous  Oncology  Dermatology  Cystic Fibrosis          How many days over the past month did your arthitis  keep you from your normal activities? For example, brushing your teeth or getting up in the morning. 0    Have you discussed this with your provider? Not needed    Acute Infection Status:    Acute infections noted within Epic:  No active infections  Patient reported infection: pneumonia- pharmacy reported to provider    Therapy Appropriateness:    Is therapy appropriate and patient progressing towards therapeutic goals? Yes, therapy is appropriate and should be continued    DISEASE/MEDICATION-SPECIFIC INFORMATION  For patients on injectable medications: Patient currently has 2 doses left.  Next injection is scheduled for 6/10 or 6/11 (the weekend).    PATIENT SPECIFIC NEEDS     - Does the patient have any physical, cognitive, or cultural barriers? No    - Is the patient high risk? No    - Does the patient require a Care Management Plan? No     SOCIAL DETERMINANTS OF HEALTH     At the [facility], we have learned that life circumstances - like trouble affording food, housing, utilities, or transportation can affect the health of many of our patients.   That is why we wanted to ask: are you currently experiencing any life circumstances that are negatively impacting your health and/or quality of life? Patient declined to answer    Social Determinants of Health     Financial Resource Strain: Not on file   Internet Connectivity: Not on file   Food Insecurity: Not on file   Tobacco Use: Medium Risk   ??? Smoking Tobacco Use: Former   ??? Smokeless Tobacco Use: Never   ??? Passive Exposure: Not on file   Housing/Utilities: Not on file   Alcohol Use: Not on file   Transportation Needs: Not on file   Substance Use: Not on file   Health Literacy: Not on file   Physical Activity: Not on file   Interpersonal Safety: Not on file   Stress: Not on file   Intimate Partner Violence: Not on file   Depression: Not on file   Social Connections: Not on file       Would you be willing to receive help with any of the needs that you have identified today? Not applicable       SHIPPING     Specialty Medication(s) to be Shipped:   Inflammatory Disorders: methotrexate (injectable)    Other medication(s) to be shipped: No additional medications requested for fill at this time     Changes to insurance: No    Delivery Scheduled: Yes, Expected medication delivery date: 6/14.     Medication will be delivered via UPS to the confirmed prescription address in [facility].    The patient will receive a drug information handout for each medication shipped and additional FDA Medication Guides as required.  Verified that patient has previously received a Conservation officer, historic buildings and a Surveyor, mining.    The patient or caregiver noted above participated in the development of this care plan and knows that they can request review of or adjustments to the care plan at any time.      All of the patient's questions and concerns have been addressed.    Julianne Rice   [facility] Specialty Pharmacist

## 2022-02-05 MED FILL — METHOTREXATE SODIUM (CONTAINS PRESERVATIVES) 25 MG/ML INJECTION SOLUTION: SUBCUTANEOUS | 28 days supply | Qty: 4 | Fill #1

## 2022-02-05 MED FILL — SODIUM CHLORIDE 3 % FOR NEBULIZATION: RESPIRATORY_TRACT | 30 days supply | Qty: 240 | Fill #2

## 2022-02-18 MED ORDER — OXYCODONE-ACETAMINOPHEN 7.5 MG-325 MG TABLET
ORAL_TABLET | Freq: Four times a day (QID) | ORAL | 0 refills | 30 days | Status: CP | PRN
Start: 2022-02-18 — End: 2023-02-18

## 2022-03-13 NOTE — Unmapped (Signed)
Specialty Medication(s): methotrexate injections    Patrick Hunter has been dis-enrolled from the [facility] specialty pharmacy services due to per wife, patient is deceased. .    Additional information provided to the patient: n/a    [name] Carlia Bomkamp  [facility] Specialty Pharmacist

## 2022-03-13 NOTE — Unmapped (Signed)
patient is deceased 

## 2022-03-20 MED ORDER — OXYCODONE-ACETAMINOPHEN 7.5 MG-325 MG TABLET
ORAL_TABLET | Freq: Four times a day (QID) | ORAL | 0 refills | 30 days | Status: CP | PRN
Start: 2022-03-20 — End: ?
